# Patient Record
Sex: Male | Born: 1935 | Race: White | Hispanic: No | Marital: Married | Smoking: Former smoker
Health system: Southern US, Community
[De-identification: ages and names within clinical notes are randomized; demographics above are authoritative.]

## PROBLEM LIST (undated history)

## (undated) DIAGNOSIS — R0609 Other forms of dyspnea: Secondary | ICD-10-CM

## (undated) DIAGNOSIS — I2510 Atherosclerotic heart disease of native coronary artery without angina pectoris: Secondary | ICD-10-CM

## (undated) DIAGNOSIS — Z9861 Coronary angioplasty status: Secondary | ICD-10-CM

## (undated) DIAGNOSIS — K219 Gastro-esophageal reflux disease without esophagitis: Secondary | ICD-10-CM

## (undated) DIAGNOSIS — K5732 Diverticulitis of large intestine without perforation or abscess without bleeding: Secondary | ICD-10-CM

## (undated) DIAGNOSIS — D649 Anemia, unspecified: Secondary | ICD-10-CM

## (undated) DIAGNOSIS — M170 Bilateral primary osteoarthritis of knee: Secondary | ICD-10-CM

## (undated) DIAGNOSIS — Z9889 Other specified postprocedural states: Secondary | ICD-10-CM

## (undated) DIAGNOSIS — I2121 ST elevation (STEMI) myocardial infarction involving left circumflex coronary artery: Secondary | ICD-10-CM

## (undated) DIAGNOSIS — I5189 Other ill-defined heart diseases: Secondary | ICD-10-CM

## (undated) DIAGNOSIS — Z951 Presence of aortocoronary bypass graft: Secondary | ICD-10-CM

## (undated) DIAGNOSIS — I10 Essential (primary) hypertension: Secondary | ICD-10-CM

## (undated) DIAGNOSIS — G4730 Sleep apnea, unspecified: Secondary | ICD-10-CM

## (undated) DIAGNOSIS — R0600 Dyspnea, unspecified: Secondary | ICD-10-CM

## (undated) DIAGNOSIS — J45909 Unspecified asthma, uncomplicated: Secondary | ICD-10-CM

## (undated) DIAGNOSIS — G8929 Other chronic pain: Secondary | ICD-10-CM

## (undated) DIAGNOSIS — M25473 Effusion, unspecified ankle: Secondary | ICD-10-CM

## (undated) DIAGNOSIS — N529 Male erectile dysfunction, unspecified: Secondary | ICD-10-CM

## (undated) DIAGNOSIS — K649 Unspecified hemorrhoids: Secondary | ICD-10-CM

## (undated) DIAGNOSIS — J189 Pneumonia, unspecified organism: Secondary | ICD-10-CM

## (undated) DIAGNOSIS — I519 Heart disease, unspecified: Secondary | ICD-10-CM

## (undated) DIAGNOSIS — E785 Hyperlipidemia, unspecified: Secondary | ICD-10-CM

## (undated) DIAGNOSIS — Z8701 Personal history of pneumonia (recurrent): Secondary | ICD-10-CM

## (undated) DIAGNOSIS — M549 Dorsalgia, unspecified: Secondary | ICD-10-CM

## (undated) DIAGNOSIS — I509 Heart failure, unspecified: Secondary | ICD-10-CM

## (undated) DIAGNOSIS — K8020 Calculus of gallbladder without cholecystitis without obstruction: Secondary | ICD-10-CM

## (undated) DIAGNOSIS — I350 Nonrheumatic aortic (valve) stenosis: Secondary | ICD-10-CM

## (undated) DIAGNOSIS — Z8679 Personal history of other diseases of the circulatory system: Secondary | ICD-10-CM

## (undated) DIAGNOSIS — D126 Benign neoplasm of colon, unspecified: Secondary | ICD-10-CM

## (undated) DIAGNOSIS — K5790 Diverticulosis of intestine, part unspecified, without perforation or abscess without bleeding: Secondary | ICD-10-CM

## (undated) DIAGNOSIS — K449 Diaphragmatic hernia without obstruction or gangrene: Secondary | ICD-10-CM

## (undated) HISTORY — PX: OTHER SURGICAL HISTORY: SHX169

## (undated) HISTORY — DX: Dorsalgia, unspecified: M54.9

## (undated) HISTORY — DX: Coronary angioplasty status: Z98.61

## (undated) HISTORY — DX: Atherosclerotic heart disease of native coronary artery without angina pectoris: I25.10

## (undated) HISTORY — PX: CATARACT EXTRACTION: SUR2

## (undated) HISTORY — PX: CORONARY ANGIOPLASTY: SHX604

## (undated) HISTORY — PX: EYE SURGERY: SHX253

## (undated) HISTORY — DX: Personal history of other diseases of the circulatory system: Z86.79

## (undated) HISTORY — DX: Effusion, unspecified ankle: M25.473

## (undated) HISTORY — DX: ST elevation (STEMI) myocardial infarction involving left circumflex coronary artery: I21.21

## (undated) HISTORY — DX: Essential (primary) hypertension: I10

## (undated) HISTORY — DX: Anemia, unspecified: D64.9

## (undated) HISTORY — DX: Presence of aortocoronary bypass graft: Z95.1

## (undated) HISTORY — DX: Diverticulosis of intestine, part unspecified, without perforation or abscess without bleeding: K57.90

## (undated) HISTORY — DX: Other specified postprocedural states: Z98.890

## (undated) HISTORY — PX: TRANSTHORACIC ECHOCARDIOGRAM: SHX275

## (undated) HISTORY — DX: Personal history of pneumonia (recurrent): Z87.01

## (undated) HISTORY — DX: Unspecified hemorrhoids: K64.9

## (undated) HISTORY — PX: MINOR HEMORRHOIDECTOMY: SHX6238

## (undated) HISTORY — DX: Nonrheumatic aortic (valve) stenosis: I35.0

## (undated) HISTORY — PX: TOTAL KNEE ARTHROPLASTY: SHX125

## (undated) HISTORY — PX: BACK SURGERY: SHX140

## (undated) HISTORY — DX: Gastro-esophageal reflux disease without esophagitis: K21.9

## (undated) HISTORY — DX: Hyperlipidemia, unspecified: E78.5

## (undated) HISTORY — DX: Male erectile dysfunction, unspecified: N52.9

## (undated) HISTORY — PX: KNEE ARTHROSCOPY: SUR90

## (undated) HISTORY — DX: Benign neoplasm of colon, unspecified: D12.6

## (undated) HISTORY — DX: Dyspnea, unspecified: R06.00

## (undated) HISTORY — DX: Bilateral primary osteoarthritis of knee: M17.0

## (undated) HISTORY — DX: Other forms of dyspnea: R06.09

## (undated) HISTORY — DX: Heart failure, unspecified: I50.9

## (undated) HISTORY — DX: Calculus of gallbladder without cholecystitis without obstruction: K80.20

## (undated) HISTORY — DX: Other chronic pain: G89.29

## (undated) HISTORY — DX: Diaphragmatic hernia without obstruction or gangrene: K44.9

---
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
## 1983-10-05 DIAGNOSIS — I2510 Atherosclerotic heart disease of native coronary artery without angina pectoris: Secondary | ICD-10-CM

## 1983-10-05 DIAGNOSIS — I2121 ST elevation (STEMI) myocardial infarction involving left circumflex coronary artery: Secondary | ICD-10-CM

## 1983-10-05 HISTORY — DX: Atherosclerotic heart disease of native coronary artery without angina pectoris: I25.10

## 1983-10-05 HISTORY — DX: ST elevation (STEMI) myocardial infarction involving left circumflex coronary artery: I21.21

## 1994-06-28 DIAGNOSIS — I25119 Atherosclerotic heart disease of native coronary artery with unspecified angina pectoris: Secondary | ICD-10-CM | POA: Insufficient documentation

## 1994-10-04 DIAGNOSIS — K5732 Diverticulitis of large intestine without perforation or abscess without bleeding: Secondary | ICD-10-CM

## 1994-10-04 HISTORY — PX: COLONOSCOPY: SHX174

## 1994-10-04 HISTORY — DX: Diverticulitis of large intestine without perforation or abscess without bleeding: K57.32

## 1995-10-05 DIAGNOSIS — Z951 Presence of aortocoronary bypass graft: Secondary | ICD-10-CM

## 1995-10-05 HISTORY — DX: Presence of aortocoronary bypass graft: Z95.1

## 1995-10-05 HISTORY — PX: CORONARY ARTERY BYPASS GRAFT: SHX141

## 2000-01-27 ENCOUNTER — Encounter: Admission: RE | Admit: 2000-01-27 | Discharge: 2000-04-26 | Payer: Self-pay | Admitting: Family Medicine

## 2002-09-04 ENCOUNTER — Encounter: Admission: RE | Admit: 2002-09-04 | Discharge: 2002-09-04 | Payer: Self-pay | Admitting: Cardiology

## 2002-09-04 ENCOUNTER — Encounter: Payer: Self-pay | Admitting: Cardiology

## 2002-10-04 DIAGNOSIS — I2510 Atherosclerotic heart disease of native coronary artery without angina pectoris: Secondary | ICD-10-CM

## 2002-10-04 HISTORY — DX: Atherosclerotic heart disease of native coronary artery without angina pectoris: I25.10

## 2003-06-05 HISTORY — PX: PERCUTANEOUS CORONARY STENT INTERVENTION (PCI-S): SHX6016

## 2003-06-05 HISTORY — PX: CARDIAC CATHETERIZATION: SHX172

## 2003-06-05 HISTORY — PX: LEFT HEART CATH AND CORS/GRAFTS ANGIOGRAPHY: CATH118250

## 2003-06-13 ENCOUNTER — Inpatient Hospital Stay (HOSPITAL_COMMUNITY): Admission: AD | Admit: 2003-06-13 | Discharge: 2003-06-18 | Payer: Self-pay | Admitting: Emergency Medicine

## 2003-06-13 ENCOUNTER — Encounter: Payer: Self-pay | Admitting: Cardiology

## 2003-06-15 ENCOUNTER — Encounter: Payer: Self-pay | Admitting: Cardiovascular Disease

## 2004-12-14 ENCOUNTER — Inpatient Hospital Stay (HOSPITAL_COMMUNITY): Admission: RE | Admit: 2004-12-14 | Discharge: 2004-12-17 | Payer: Self-pay | Admitting: Orthopedic Surgery

## 2005-08-04 ENCOUNTER — Ambulatory Visit (HOSPITAL_COMMUNITY): Admission: RE | Admit: 2005-08-04 | Discharge: 2005-08-04 | Payer: Self-pay | Admitting: Orthopedic Surgery

## 2005-08-04 ENCOUNTER — Ambulatory Visit (HOSPITAL_BASED_OUTPATIENT_CLINIC_OR_DEPARTMENT_OTHER): Admission: RE | Admit: 2005-08-04 | Discharge: 2005-08-04 | Payer: Self-pay | Admitting: Orthopedic Surgery

## 2005-12-02 HISTORY — PX: PERCUTANEOUS CORONARY STENT INTERVENTION (PCI-S): SHX6016

## 2006-06-02 ENCOUNTER — Inpatient Hospital Stay (HOSPITAL_COMMUNITY): Admission: EM | Admit: 2006-06-02 | Discharge: 2006-06-03 | Payer: Self-pay | Admitting: Emergency Medicine

## 2006-09-22 ENCOUNTER — Ambulatory Visit: Payer: Self-pay | Admitting: Family Medicine

## 2006-12-16 ENCOUNTER — Inpatient Hospital Stay (HOSPITAL_COMMUNITY): Admission: EM | Admit: 2006-12-16 | Discharge: 2006-12-17 | Payer: Self-pay | Admitting: Emergency Medicine

## 2007-01-19 ENCOUNTER — Ambulatory Visit: Payer: Self-pay | Admitting: Family Medicine

## 2007-11-13 ENCOUNTER — Ambulatory Visit: Payer: Self-pay | Admitting: Family Medicine

## 2008-03-12 ENCOUNTER — Ambulatory Visit: Payer: Self-pay | Admitting: Family Medicine

## 2008-07-09 ENCOUNTER — Ambulatory Visit: Payer: Self-pay | Admitting: Family Medicine

## 2008-12-02 HISTORY — PX: CARDIAC CATHETERIZATION: SHX172

## 2008-12-02 HISTORY — PX: LEFT HEART CATH AND CORS/GRAFTS ANGIOGRAPHY: CATH118250

## 2008-12-15 ENCOUNTER — Inpatient Hospital Stay (HOSPITAL_COMMUNITY): Admission: EM | Admit: 2008-12-15 | Discharge: 2008-12-16 | Payer: Self-pay | Admitting: Emergency Medicine

## 2009-01-01 ENCOUNTER — Ambulatory Visit (HOSPITAL_COMMUNITY): Admission: RE | Admit: 2009-01-01 | Discharge: 2009-01-01 | Payer: Self-pay | Admitting: Cardiovascular Disease

## 2009-04-01 ENCOUNTER — Ambulatory Visit: Payer: Self-pay | Admitting: Family Medicine

## 2009-07-02 ENCOUNTER — Ambulatory Visit: Payer: Self-pay | Admitting: Family Medicine

## 2009-08-15 ENCOUNTER — Ambulatory Visit (HOSPITAL_COMMUNITY): Admission: RE | Admit: 2009-08-15 | Discharge: 2009-08-17 | Payer: Self-pay | Admitting: Orthopedic Surgery

## 2009-08-21 ENCOUNTER — Ambulatory Visit: Payer: Self-pay | Admitting: Family Medicine

## 2010-03-31 ENCOUNTER — Ambulatory Visit: Payer: Self-pay | Admitting: Family Medicine

## 2010-04-01 ENCOUNTER — Encounter: Admission: RE | Admit: 2010-04-01 | Discharge: 2010-04-01 | Payer: Self-pay | Admitting: Family Medicine

## 2010-04-23 ENCOUNTER — Ambulatory Visit (HOSPITAL_COMMUNITY): Admission: RE | Admit: 2010-04-23 | Discharge: 2010-04-24 | Payer: Self-pay | Admitting: Neurological Surgery

## 2010-04-23 HISTORY — PX: OTHER SURGICAL HISTORY: SHX169

## 2010-04-23 HISTORY — PX: CERVICAL DISCECTOMY: SHX98

## 2010-05-25 ENCOUNTER — Encounter: Admission: RE | Admit: 2010-05-25 | Discharge: 2010-05-25 | Payer: Self-pay | Admitting: Anesthesiology

## 2010-08-11 ENCOUNTER — Encounter: Admission: RE | Admit: 2010-08-11 | Discharge: 2010-08-11 | Payer: Self-pay | Admitting: Neurological Surgery

## 2010-10-09 ENCOUNTER — Ambulatory Visit
Admission: RE | Admit: 2010-10-09 | Discharge: 2010-10-09 | Payer: Self-pay | Source: Home / Self Care | Attending: Family Medicine | Admitting: Family Medicine

## 2010-11-16 ENCOUNTER — Ambulatory Visit
Admission: RE | Admit: 2010-11-16 | Discharge: 2010-11-16 | Disposition: A | Discharge status: Home / Self Care | Payer: Medicare Other | Source: Ambulatory Visit | Attending: Neurological Surgery | Admitting: Neurological Surgery

## 2010-11-16 ENCOUNTER — Other Ambulatory Visit: Payer: Self-pay | Admitting: Neurological Surgery

## 2010-11-16 DIAGNOSIS — M67919 Unspecified disorder of synovium and tendon, unspecified shoulder: Secondary | ICD-10-CM

## 2010-11-16 DIAGNOSIS — M7500 Adhesive capsulitis of unspecified shoulder: Secondary | ICD-10-CM

## 2010-12-19 LAB — DIFFERENTIAL
Basophils Relative: 0 % (ref 0–1)
Eosinophils Absolute: 0 10*3/uL (ref 0.0–0.7)
Eosinophils Relative: 0 % (ref 0–5)
Lymphocytes Relative: 24 % (ref 12–46)
Lymphs Abs: 1.3 10*3/uL (ref 0.7–4.0)
Monocytes Absolute: 0.6 10*3/uL (ref 0.1–1.0)
Monocytes Relative: 10 % (ref 3–12)
Neutro Abs: 3.6 10*3/uL (ref 1.7–7.7)

## 2010-12-19 LAB — COMPREHENSIVE METABOLIC PANEL
AST: 29 U/L (ref 0–37)
Albumin: 3.8 g/dL (ref 3.5–5.2)
Chloride: 104 mEq/L (ref 96–112)
Creatinine, Ser: 1.56 mg/dL — ABNORMAL HIGH (ref 0.4–1.5)
GFR calc Af Amer: 53 mL/min — ABNORMAL LOW (ref >60)
GFR calc non Af Amer: 44 mL/min — ABNORMAL LOW (ref >60)
Potassium: 4.6 mEq/L (ref 3.5–5.1)
Sodium: 138 mEq/L (ref 135–145)
Total Protein: 6.5 g/dL (ref 6.0–8.3)

## 2010-12-19 LAB — GLUCOSE, CAPILLARY
Glucose-Capillary: 170 mg/dL — ABNORMAL HIGH (ref 70–99)
Glucose-Capillary: 170 mg/dL — ABNORMAL HIGH (ref 70–99)
Glucose-Capillary: 223 mg/dL — ABNORMAL HIGH (ref 70–99)

## 2010-12-19 LAB — PROTIME-INR: Prothrombin Time: 12.4 seconds (ref 11.6–15.2)

## 2010-12-19 LAB — CBC
Hemoglobin: 12.8 g/dL — ABNORMAL LOW (ref 13.0–17.0)
MCV: 96.4 fL (ref 78.0–100.0)
WBC: 5.5 10*3/uL (ref 4.0–10.5)

## 2011-01-06 LAB — GLUCOSE, CAPILLARY
Glucose-Capillary: 107 mg/dL — ABNORMAL HIGH (ref 70–99)
Glucose-Capillary: 109 mg/dL — ABNORMAL HIGH (ref 70–99)
Glucose-Capillary: 110 mg/dL — ABNORMAL HIGH (ref 70–99)
Glucose-Capillary: 113 mg/dL — ABNORMAL HIGH (ref 70–99)
Glucose-Capillary: 166 mg/dL — ABNORMAL HIGH (ref 70–99)

## 2011-01-06 LAB — CBC
HCT: 33.8 % — ABNORMAL LOW (ref 39.0–52.0)
HCT: 34.8 % — ABNORMAL LOW (ref 39.0–52.0)
MCHC: 33.7 g/dL (ref 30.0–36.0)
MCHC: 34 g/dL (ref 30.0–36.0)
MCV: 94.5 fL (ref 78.0–100.0)
MCV: 97.4 fL (ref 78.0–100.0)
Platelets: 133 10*3/uL — ABNORMAL LOW (ref 150–400)
Platelets: 199 10*3/uL (ref 150–400)
RBC: 3.19 MIL/uL — ABNORMAL LOW (ref 4.22–5.81)
RBC: 3.58 MIL/uL — ABNORMAL LOW (ref 4.22–5.81)
WBC: 7.8 10*3/uL (ref 4.0–10.5)

## 2011-01-06 LAB — COMPREHENSIVE METABOLIC PANEL
Albumin: 3.6 g/dL (ref 3.5–5.2)
CO2: 25 mEq/L (ref 19–32)
GFR calc Af Amer: 41 mL/min — ABNORMAL LOW (ref >60)
GFR calc non Af Amer: 34 mL/min — ABNORMAL LOW (ref >60)
Glucose, Bld: 184 mg/dL — ABNORMAL HIGH (ref 70–99)
Sodium: 139 mEq/L (ref 135–145)
Total Bilirubin: 0.4 mg/dL (ref 0.3–1.2)
Total Protein: 6.3 g/dL (ref 6.0–8.3)

## 2011-01-06 LAB — TYPE AND SCREEN: ABO/RH(D): A POS

## 2011-01-06 LAB — POCT I-STAT 4, (NA,K, GLUC, HGB,HCT): Hemoglobin: 10.2 g/dL — ABNORMAL LOW (ref 13.0–17.0)

## 2011-01-06 LAB — HEMOGLOBIN AND HEMATOCRIT, BLOOD: Hemoglobin: 9.4 g/dL — ABNORMAL LOW (ref 13.0–17.0)

## 2011-01-14 LAB — GLUCOSE, CAPILLARY
Glucose-Capillary: 100 mg/dL — ABNORMAL HIGH (ref 70–99)
Glucose-Capillary: 153 mg/dL — ABNORMAL HIGH (ref 70–99)

## 2011-01-14 LAB — CBC
HCT: 39.7 % (ref 39.0–52.0)
Hemoglobin: 13.5 g/dL (ref 13.0–17.0)
Hemoglobin: 13.9 g/dL (ref 13.0–17.0)
MCHC: 34.3 g/dL (ref 30.0–36.0)
RBC: 4.07 MIL/uL — ABNORMAL LOW (ref 4.22–5.81)
RBC: 4.18 MIL/uL — ABNORMAL LOW (ref 4.22–5.81)
RDW: 13.6 % (ref 11.5–15.5)
WBC: 5.2 10*3/uL (ref 4.0–10.5)
WBC: 6.1 10*3/uL (ref 4.0–10.5)

## 2011-01-14 LAB — DIFFERENTIAL
Lymphs Abs: 1.3 10*3/uL (ref 0.7–4.0)
Monocytes Absolute: 0.4 10*3/uL (ref 0.1–1.0)
Monocytes Relative: 7 % (ref 3–12)
Neutro Abs: 4.3 10*3/uL (ref 1.7–7.7)
Neutrophils Relative %: 72 % (ref 43–77)

## 2011-01-14 LAB — MAGNESIUM: Magnesium: 2.2 mg/dL (ref 1.5–2.5)

## 2011-01-14 LAB — POCT I-STAT, CHEM 8
HCT: 45 % (ref 39.0–52.0)
Hemoglobin: 15.3 g/dL (ref 13.0–17.0)
Sodium: 136 mEq/L (ref 135–145)
TCO2: 23 mmol/L (ref 0–100)

## 2011-01-14 LAB — APTT: aPTT: 27 seconds (ref 24–37)

## 2011-01-14 LAB — LIPID PANEL
Total CHOL/HDL Ratio: 6.1 RATIO
VLDL: 30 mg/dL (ref 0–40)

## 2011-01-14 LAB — COMPREHENSIVE METABOLIC PANEL
Albumin: 3.7 g/dL (ref 3.5–5.2)
BUN: 23 mg/dL (ref 6–23)
Creatinine, Ser: 1.34 mg/dL (ref 0.4–1.5)
Glucose, Bld: 114 mg/dL — ABNORMAL HIGH (ref 70–99)
Total Bilirubin: 0.5 mg/dL (ref 0.3–1.2)
Total Protein: 6.5 g/dL (ref 6.0–8.3)

## 2011-01-14 LAB — CARDIAC PANEL(CRET KIN+CKTOT+MB+TROPI)
CK, MB: 6.4 ng/mL — ABNORMAL HIGH (ref 0.3–4.0)
CK, MB: 7.1 ng/mL — ABNORMAL HIGH (ref 0.3–4.0)
CK, MB: 7.4 ng/mL — ABNORMAL HIGH (ref 0.3–4.0)
Relative Index: 2.6 — ABNORMAL HIGH (ref 0.0–2.5)
Relative Index: 2.8 — ABNORMAL HIGH (ref 0.0–2.5)
Total CK: 244 U/L — ABNORMAL HIGH (ref 7–232)
Total CK: 247 U/L — ABNORMAL HIGH (ref 7–232)
Troponin I: <0.01 ng/mL (ref 0.00–0.06)
Troponin I: <0.01 ng/mL (ref 0.00–0.06)
Troponin I: <0.01 ng/mL (ref 0.00–0.06)

## 2011-01-14 LAB — POCT CARDIAC MARKERS: Myoglobin, poc: 152 ng/mL (ref 12–200)

## 2011-01-14 LAB — HEMOGLOBIN A1C
Hgb A1c MFr Bld: 6.2 % — ABNORMAL HIGH (ref 4.6–6.1)
Mean Plasma Glucose: 131 mg/dL

## 2011-01-14 LAB — PROTIME-INR: INR: 1 (ref 0.00–1.49)

## 2011-01-14 LAB — BRAIN NATRIURETIC PEPTIDE: Pro B Natriuretic peptide (BNP): <30 pg/mL (ref 0.0–100.0)

## 2011-01-26 ENCOUNTER — Other Ambulatory Visit: Payer: Self-pay | Admitting: Neurological Surgery

## 2011-01-26 DIAGNOSIS — M542 Cervicalgia: Secondary | ICD-10-CM

## 2011-02-08 ENCOUNTER — Ambulatory Visit
Admission: RE | Admit: 2011-02-08 | Discharge: 2011-02-08 | Disposition: A | Discharge status: Home / Self Care | Payer: Medicare Other | Source: Ambulatory Visit | Attending: Neurological Surgery | Admitting: Neurological Surgery

## 2011-02-08 DIAGNOSIS — M542 Cervicalgia: Secondary | ICD-10-CM

## 2011-02-16 NOTE — Cardiovascular Report (Signed)
NAMEJOSEDANIEL, [name] NO.:  [id number]   MEDICAL RECORD NO.:  [id number]          PATIENT TYPE:  OIB   LOCATION:  2899                         FACILITY:  [facility]   PHYSICIAN:  Thereasa Solo. Little, M.D. DATE OF BIRTH:  Aug 16, 1936   DATE OF PROCEDURE:  01/01/2009  DATE OF DISCHARGE:  01/01/2009                            CARDIAC CATHETERIZATION   INDICATIONS FOR TEST:  This 75 year old male had an inferior infarct in  1985, bypass surgery in 1997, had a Cypher stent to his RCA in 2007, and  prior to that had a stent placed in circumflex system.   He was hospitalized overnight on December 15, 2008, with chest pain, ruled  out for an MI, had a nuclear study that was negative for ischemia and a  51% ejection fraction but despite this continues to have exertional  chest pain that is nitroglycerin responsive.  Because of this, the plans  were made to bring him for an outpatient cardiac catheterization.   After obtained informed consent, the patient was prepped and draped in  the usual sterile fashion exposing the right groin.  Following local  anesthetic with 1% Xylocaine, the Seldinger technique was employed and a  5-French introducer sheath was placed in the right femoral artery.  Left  and right coronary arteriography and ventriculography and graft  visualization was performed.   COMPLICATIONS:  None.   EQUIPMENT:  A 5-French Judkins configuration catheters.  The grafts were  visualized with a right coronary catheter.   Total contrast 70 mL.   RESULTS:  1. Hemodynamic monitoring:  His central aortic pressure was 149/69.      His left ventricular pressure was 155/5.  At the time of pullback      across the aortic valve, there was no significant gradient.  2. 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  The remainder of the segments were normal.  His   ejection fraction was approximately 50%.  There was no mitral      regurgitation.  The left ventricular end-diastolic pressure was 22.  3. Coronary arteriography:  On fluoroscopy, stents were noted in the      distribution of the right coronary artery and circumflex with mild      calcification in the left main and LAD.  4. Left main normal and bifurcated.  5. LAD.  The LAD was 100% occluded after the first septal perforator,      but the distal LAD was grafted.  6. Circumflex.  The circumflex gave rise to 1 large OM vessel.  There      was a stent that extended from the ongoing circumflex and well into      the large OM vessel that bifurcated.  The stent was widely patent      as was the proximal circumflex and the ongoing OM.  The ongoing      circumflex, however, came off within the stent self.  It was a  small vessel.  It had ostial and proximal 70-80% narrowing with an      area of saccular dilatation just adjacent to the area of narrowing.      The ongoing vessel was relatively small with mild-to-moderate      irregularities.  The vessel was approximately 1.5-1.75 mm in      diameter, was relatively short.  I compared this to March 2008 cath      and there has been no significant change in the appearance of this      vessel.   1. Right coronary artery.  Right coronary artery was a dominant      vessel.  There was a long stent in the midportion of the right      coronary artery that was widely patent.  There was minimal less      than 40% distal irregularities.  The PDA and posterolateral vessels      were free of disease.  2. Saphenous vein graft to the OM, 100% occluded in its ostium.  3. Internal mammary artery to the LAD.  The internal mammary artery to      the LAD was widely patent and filled the diagonal retrograde.   CONCLUSIONS:  1. Loss of the saphenous vein graft to the OM system, which is an old      finding.  2. Widely patent stents in the native OM and in the  right coronary      artery.  3. No high-grade stenosis in any major vessels, but the ongoing      circumflex has ostial 70-80% narrowing.  This comes off within the      stent, is followed by saccular aneurysmal segment.  Any type of      intervention on this relatively small and somewhat insignificant      vessel would clearly be high risk.  In view of his nuclear study      being negative for ischemia, I have opted to treat him medically.      He will be discharged to home today.  I will see him in the office      in about a week and I have increased metoprolol from 50 to 75 mg.           ______________________________  Thereasa Solo. Little, M.D.     ABL/MEDQ  D:  01/01/2009  T:  01/02/2009  Job:  [id number]   cc:   Sharlot Gowda, M.D.  Cardiac Cath Lab

## 2011-02-16 NOTE — Discharge Summary (Signed)
NAMEDARRIK, RICHMAN NO.:  [id number]   MEDICAL RECORD NO.:  [id number]          PATIENT TYPE:  INP   LOCATION:  4729                         FACILITY:  [facility]   PHYSICIAN:  Thereasa Solo. Little, M.D. DATE OF BIRTH:  1936-08-04   DATE OF ADMISSION:  12/15/2008  DATE OF DISCHARGE:  12/16/2008                               DISCHARGE SUMMARY   DISCHARGE DIAGNOSES:  1. Unstable angina, myocardial infarction ruled out.  2. Known coronary artery disease with history of coronary artery      bypass grafting in 1997 multiple percutaneous coronary intervention      stents, last intervention was right coronary artery Cypher stent in      August 2007.  3. Treated dyslipidemia.  4. Type 2 diabetes, non-insulin dependent.  5. Ischemic cardiomyopathy with an ejection fraction of 30-35%.  6. Degenerative joint disease with previous history of C-spine fusion.   HOSPITAL COURSE:  The patient is a 75 year old male with coronary artery  disease as described above who presented with chest pain worrisome for  unstable angina on December 15, 2008.  His troponins are negative.  The  patient was seen by Dr. Garen Lah on December 16, 2008.  He has had no  recurrent pain.  Dr. Garen Lah feels he can be discharged and have a  outpatient stress test, this will be set up for tomorrow.   LABORATORY DATA AT DISCHARGE:  White count 5.2, hemoglobin 13.5,  hematocrit 39.7, platelets 151.  Sodium 134, potassium 4.1, BUN 23,  creatinine 1.3, hemoglobin A1c is 6.2.  CK MBs were slightly elevated at  266 and 244, MBs were 7, troponins were negative.  Lipids show a  cholesterol 189, HDL 31, LDL 128.  TSH is 4.03, digoxin level 0.6.  A  chest x-ray is no acute process.  EKG shows sinus rhythm, sinus  bradycardia, and inferior Qs.   DISCHARGE MEDICATIONS:  1. Plavix 75 mg a day.  2. Lanoxin 0.125 mg a day.  3. Metoprolol XL 50 mg daily.  4. Hyzaar 100/12.5 daily.  5. Actoplus  15/850 daily.  6. Iron once a  day.  7. Aspirin 325 mg a day.  8. Prilosec 20 mg a day.  9. Trilipix.  10.He also will be taking nitroglycerin sublingual p.r.n.   DISPOSITION:  The patient discharged in stable condition and will be set  up for a Lexiscan Myoview in the office and then a close followup with  Dr. Clarene Duke.      Abelino Derrick, P.A.    ______________________________  Thereasa Solo. Little, M.D.    Lenard Lance  D:  12/16/2008  T:  12/17/2008  Job:  [id number]   cc:   Sharlot Gowda, M.D.

## 2011-02-19 NOTE — Op Note (Signed)
[name], [name] NO.:  [id number]   MEDICAL RECORD NO.:  [id number]          PATIENT TYPE:  INP   LOCATION:  5030                         FACILITY:  [facility]   PHYSICIAN:  Mila Homer. Sherlean Foot, M.D. DATE OF BIRTH:  [birth year]   DATE OF PROCEDURE:  12/14/2004  DATE OF DISCHARGE:                                 OPERATIVE REPORT   SURGEON:  Georgena Spurling, M.D.   ASSISTANTChestine Spore, P.A.   ANESTHESIA:  General.   PREOPERATIVE DIAGNOSIS:  Right knee arthritis.   POSTOPERATIVE DIAGNOSIS:  Right knee arthritis.   PROCEDURE:  Right total knee arthroplasty.   INDICATIONS FOR PROCEDURE:  The patient is a 75 year old white male with  failure of conservative measures for osteoarthritis of the knee.  Informed  consent was obtained.   DESCRIPTION OF PROCEDURE:  Patient was laid supine and administered general  anesthesia.  The right leg was prepped and draped in the usual sterile  fashion.  A #10 blade was used to make a midline incision.  A fresh blade  was used to make a median parapatellar arthrotomy and to elevate the deep  MCL off the medial crust of the tibia.  A synovectomy was also performed  with a #10 blade.  I then inverted the patella, measured at 24 mm thick.  I  reamed it down to a thickness of 15 and then drilled 3 lug holes and with  the prosthetic trial in place recreated a 24 mm thick patella.  I removed  the prosthetic trial and went into flexion with the Z-retractor behind the  MCL.  I then set our extramedullary tibial alignment guide up and made a  cut, removing 2 mm of bone off the medial tibial plateau, a perpendicular  cut to the anatomic access of the tibia.  I then removed the external jig  and the cut surface of the tibia.  I then made an intramedullary drill hole  in the femur.  I set the intramedullary guide on the 60 degree valgus cut,  tented down to the distal femur and pinned the distal femoral cutting block  into place.  I removed the  intramedullary guide and made the distal femoral  cut with a sagittal saw.  I then removed the cutting block.  I then marked  out __________ along the epicondylar axis and used a multi-Robinson  technique to orient the femoral component.  I sized to a size E, it was  between an E and an F and I decided to go down a size.  I pinned the 4-in-1  cutting block the 3 degree external rotation holes, matching our posterior  condylar angle.  I then made anterior, posterior and Chamfer cuts with a  sagittal saw.  I removed the cutting block and the cut surfaces of the bone  and placed a laminar spreader in the knee.  I removed the medial and lateral  menisci, ACL, PCL and posterior condylar osteophytes.  I then placed a 10 mm  spacer block and the knee had excellent flexion, extension gap balance.  At  this point,  I placed a scoop retractor in the knee and finished the femur  with a size 8 tibial cutting block drill and cutting the notch and drilling  the lug holes.  I removed the finishing block from the femur and placed a  size 5 tibial tray, finishing tray, and used the drill on the keel.  I then  trialed with a size 5 tibia, size E femur, size 10 insert and size 35  patella.  Had excellent flexion, extension gap balance, range of motion 0 to  drop and dangle of 130 degrees, patella tracked perfectly.  I then removed  all of the components and copiously irrigated.  I then cemented in the tibia  first, femur second, removed excess cement, snapped in __________  polyethylene, located the knee.  I then cemented in the patella.  Once the  cement was hardened, I let the tourniquet down, cauterized the bleeding  vessels and then closed with interrupted figure-of-eight #1 Vicryl sutures  in the arthrotomy, interrupted 0 buried Vicryl sutures in the deep soft  tissues and a running subcuticular Vicryl stitch and skin staples.  A  dressing with Adaptic, 4 x 4, sterile Webril and a TED stocking.    COMPLICATIONS:  None.   DRAINS:  One Hemovac.   EBL:  300 mL.   TOURNIQUET TIME:  1 hour.      SDL/MEDQ  D:  12/14/2004  T:  12/14/2004  Job:  [id number]

## 2011-02-19 NOTE — H&P (Signed)
[name], [name] NO.:  [id number]   MEDICAL RECORD NO.:  [id number]          PATIENT TYPE:  INP   LOCATION:  NA                           FACILITY:  [facility]   PHYSICIAN:  Mila Homer. Sherlean Foot, M.D. DATE OF BIRTH:  [birth year]   DATE OF ADMISSION:  12/14/2004  DATE OF DISCHARGE:                                HISTORY & PHYSICAL   CHIEF COMPLAINT:  Right knee pain significantly worse over the last two or  three months.   HISTORY OF PRESENT ILLNESS:  Patient is a 75 year old white male with  intermittent 20-30 year history of progressively worsening right knee pain  which has significant worsened since the first of this year.  He describes  the pain as a constant ache pain in the medial aspect of his knee with a  sharp shooting pain with awkward movements.  It does radiate up into the leg  into the hip.  He denies any mechanical symptoms but he does have occasional  locking and giving out of the knee.  He does have swelling.  He does have  significant night pain.  He denies any previous injury.  X-rays reveal bone  on bone medial joint.   ALLERGIES:  No known drug allergies.   CURRENT MEDICATIONS:  1.  Lanoxin 0.25 mg p.o. daily.  2.  Ecotrin 325 mg p.o. daily.  3.  Toprol XL 50 mg p.o. daily.  4.  Cozaar 100 mg p.o. daily.  5.  Plavix 75 mg p.o. daily.  6.  Prevacid 30 mg p.o. daily.  7.  Metformin HCL 500 mg p.o. b.i.d.  8.  Viagra 50 mg p.r.n.   PAST MEDICAL HISTORY:  1.  Diabetes mellitus.  2.  Coronary artery disease with five MIs, CABG, and stent with recent      stress test evaluation.   PAST SURGICAL HISTORY:  1.  Back surgery in 1978, 1982, 1986, 1989, and 1990 by Dr. Simonne Come.  2.  Left knee arthroscopy by Dr. Simonne Come in 1984.  3.  Neck surgery by Dr. Otelia Sergeant in 1991.  4.  Coronary angioplasty by Dr. Clarene Duke in 2000.  5.  Hemorrhoidectomy by Dr. __________ in 1982.  6.  Heart surgery with Dr. Clarene Duke in 1985, 1986, and 2004.  7.  Bypass surgery  in 1990 by Dr. Edwyna Shell.   Patient states he has had a Staph infection with a previous heart surgery  which required a one week hospitalization, but no long-term IV antibiotics.  Patient states he has no previous complications with anesthesia except for  postoperative nausea.   SOCIAL HISTORY:  Patient is a 75 year old healthy-appearing, tall white  male.  Denies any history of smoking.  He does have the occasional alcoholic  beverage.  He is married.  He has two grown children.  He lives in a one-  story house.  He is a retired Curator.   Family physician is Dr. Sharlot Gowda at [phone].   Cardiology is Dr. Julieanne Manson.   FAMILY HISTORY:  Mother is deceased from unknown causes.  Father is deceased  from complications of his  heart and diabetes.  Two brothers are deceased  from MIs.  One brother alive, unknown medical issues.  Two sisters alive,  one with diabetes.   REVIEW OF SYSTEMS:  Recent cold three to four weeks previous.  Was treated  by Dr. Susann Givens.  No resulting issues.  He does wear upper dentures.  He does  wear glasses at all times.  He does have some shortness of breath with  exertion related to his cardiac disease and deconditioning.  He does have  issues with reflux which are fairly well controlled with his Prevacid and  occasional urinary frequency.  He otherwise denies any other complaints and  issues in any other category with review of systems if not mentioned above.   PHYSICAL EXAMINATION:  VITAL SIGNS:  Height 5 feet 11 inches, weight 208  pounds, blood pressure 138/92, pulse regular at 80 beats per minute,  respirations 12.  Patient was afebrile.  GENERAL:  This is a fairly healthy-appearing, well-developed white male.  Ambulates fairly briskly with an even gait.  Easily gets on and off the  examination table.  HEENT:  Head was normocephalic.  Pupils are equal, round, and reactive.  Extraocular movements are intact.  Sclerae was not icteric.  External ears   without deformities.  Gross hearing is intact.  Oral buccal mucosa was pink.  He had upper dentures in place.  NECK:  Supple.  No palpable lymphadenopathy.  Thyroid region was nontender.  He had good range of motion of his cervical spine.  CHEST:  Lung sounds were clear and equal bilaterally.  No wheezes, rales,  rhonchi.  HEART:  Regular rate and rhythm.  No murmurs, rubs, or gallops.  He had a  well healed midline surgical incision across his chest.  ABDOMEN:  Soft, nontender.  Bowel sounds normoactive throughout.  CVA region  was nontender.  EXTREMITIES:  Upper extremities were symmetrically sized and shaped.  He had  good range of motion with shoulders, elbows, and wrists.  Motor strength was  5/5.  Lower extremities:  Right and left hip had full extension, flexion up  to 130 degrees with 20-30 degrees internal/external rotation without any  difficulty or tenderness.  Right knee had no appreciable swelling at this  time, no effusion.  He was significantly tender along the medial joint.  He  had full extension/flexion to 120 degrees.  He had about 5 degree valgus to  varus laxity, moderate crepitus on the patella.  Calf was nontender.  Left  knee had full extension, flexion back to 120 degrees.  No instability.  No  joint line tenderness.  No effusion.  The calf was soft and nontender.  The  ankles were symmetrical with good dorsi/plantar flexion.  PERIPHERAL VASCULAR:  Carotid pulses were 2+.  No bruits.  Radial pulses 2+.  He had 1+ posterior tibial pulses.  He had no lower extremity edema noted.  NEUROLOGIC:  Patient was conscious, alert, appropriate.  Easy conversation  with examiner.  Cranial nerves II-XII were grossly intact.  He had no gross  neurologic defects noted.  BREASTS:  Deferred at this time.  RECTAL:  Deferred at this time.  GENITOURINARY:  Deferred at this time.   IMPRESSION:  1.  End-stage osteoarthritis right knee.  2.  Diabetes. 3.  Coronary artery disease  with history of five myocardial infarctions,      coronary artery bypass graft, and stenting with recent stress test.   PLAN:  Patient indicates he has had a  recent stress test and has been  cleared by Dr. Clarene Duke from the cardiac standpoint.  We have not received any  of this information at this time.  We have contacted Dr. Fredirick Maudlin office for  this information to get clearance for surgery.  Patient indicates he has  also been to his primary care physician, Dr. Susann Givens, and we have not  received any information and this has also been requested.  Patient will  undergo all of the routine laboratories and tests prior to having this right  total knee arthroplasty on March 13 by Dr. Sherlean Foot.      RWK/MEDQ  D:  12/09/2004  T:  12/09/2004  Job:  [id number]

## 2011-02-19 NOTE — Op Note (Signed)
NAMEJAYLEND, [name] NO.:  [id number]   MEDICAL RECORD NO.:  [id number]          PATIENT TYPE:  AMB   LOCATION:  DSC                          FACILITY:  [facility]   PHYSICIAN:  Mila Homer. Sherlean Foot, M.D. DATE OF BIRTH:  Feb 17, 1936   DATE OF PROCEDURE:  08/04/2005  DATE OF DISCHARGE:                                 OPERATIVE REPORT   PREOPERATIVE DIAGNOSIS:  Left knee osteoarthritis with medial and lateral  meniscus tears.   POSTOPERATIVE DIAGNOSIS:  Left knee osteoarthritis with medial and lateral  meniscus tears.   OPERATION PERFORMED:  Left knee arthroscopy with partial medial  meniscectomy, partial lateral meniscectomy, chondroplasty of all three  compartments.   SURGEON:  Mila Homer. Sherlean Foot, M.D.   ASSISTANT:  None.   ANESTHESIA:  MAC.   INDICATIONS FOR PROCEDURE:  The patient is a 75 year old with a combination  of osteoarthritic symptoms and meniscal tear symptoms.  Informed consent was  obtained.   DESCRIPTION OF PROCEDURE:  The patient was taken to the operating room,  administered knee block and MAC anesthesia. The left lower extremity was  prepped and draped in the usual sterile fashion.  No tourniquet was used.  Inferolateral and inferomedial portals were created with a #11 blade, blunt  trocar and cannula.  Diagnostic arthroscopy revealed grade 3 chondromalacia  in the patellofemoral joint, grade 4 in the medial compartment and grade 1  and 2 in the lateral compartment. There was medial and lateral meniscus  tearing.  Partial medial meniscectomy and partial lateral meniscectomies  were carried out with a straight basket forceps and a 3.2 great white  shaver.  Once the meniscal cartilages were debrided back to a stable rim of  tissue, I then performed a chondroplasty on the medial femoral condyle,  medial tibial plateau, lateral tibial plateau and the trochlea and patella.  I then lavaged and closed with a 4-0 nylon suture, dressed with Xeroform  dressings, sponges, sterile Webril and Ace wrap.   COMPLICATIONS:  None.   DRAINS:  None.           ______________________________  Mila Homer. Sherlean Foot, M.D.     SDL/MEDQ  D:  08/04/2005  T:  08/04/2005  Job:  [id number]

## 2011-02-19 NOTE — Discharge Summary (Signed)
NAMEJANELLE, [name] NO.:  [id number]   MEDICAL RECORD NO.:  [id number]          PATIENT TYPE:  OBV   LOCATION:  6527                         FACILITY:  [facility]   PHYSICIAN:  Thereasa Solo. Little, M.D. DATE OF BIRTH:  [birth year]   DATE OF ADMISSION:  06/01/2006  DATE OF DISCHARGE:  06/03/2006                                 DISCHARGE SUMMARY   DISCHARGE DIAGNOSES:  1. Unstable angina, elective native right coronary artery Cypher stenting      this admission.  2. History of coronary disease, coronary artery bypass grafting in 1990.  3. Non-insulin-dependent diabetes.  4. Treated hypertension.  5. Treated dyslipidemia.  6. Moderate left ventricular dysfunction with an ejection fraction of 30-      35% at catheterization.   HOSPITAL COURSE:  [name] is a 75 year old male who was admitted by Dr.  Waldon Reining June 01, 2006, with unstable angina.  He has a history of coronary  disease as noted above.  He had bypass surgery in 1990 and has had several  interventions since.  The last intervention was a PTCA to the circumflex and  RCA in 2004.  Previous EF has been measured at 45%.  He presented June 01, 2006, and was started on Lovenox and nitrates.  Enzymes were negative.  He  was set up for diagnostic catheterization which was done June 02, 2006, by  Dr. Jenne Campus.  This revealed a patent LIMA to the LAD with a 95% distal  native LAD stenosis.  The LAD was totaled proximally.  The RCA had a patent  mid stent, but an 80% stenosis just proximal to the first stent.  The distal  RCA had a 60% stenosis.  Previous proximal circumflex and OM site were  patent, although the distal circumflex had a 99% occlusion and was small.  EF was 30-35%.  There was a previous SVG to the OM that is occluded.  The  patient underwent Cypher stenting to the native RCA with good results.  We  feel he can be discharged June 03, 2006.   DISCHARGE MEDICATIONS:  1. Imdur 15 mg a day has been  added.  2. He is also on coated aspirin once a day.  3. Lanoxin 0.25 mg a day.  4. Toprol-XL 50 mg a day.  5. Plavix 75 mg a day.  6. Crestor 10 mg a day.  7. Hyzaar 100/12.5 daily.  8. Actos and metformin will be held until Sunday.  9. Nitroglycerin sublingual p.r.n.   LABORATORIES:  White count 6.1, hemoglobin 11.6, hematocrit 34.7, platelets  142.  Sodium 141, potassium 4.1, BUN 13, creatinine 1.1, glucose 112.  LFTs  are normal.  Troponins are negative x3.  CK-MBs are negative.  BNP  was 30.  Digoxin level 0.9.  Chest x-ray:  No acute findings.  INR is 1.0.  EKG shows sinus rhythm, sinus bradycardia, with nonspecific ST changes,  small inferior Q's.   DISPOSITION:  The patient is discharged in stable condition and will follow  up with Dr. Clarene Duke as an outpatient.      [name].  Diona Fanti, P.A.    ______________________________  Thereasa Solo Little, M.D.    Lenard Lance  D:  06/03/2006  T:  06/03/2006  Job:  [id number]   cc:   Sharlot Gowda, M.D.

## 2011-02-19 NOTE — Discharge Summary (Signed)
NAMEDAYNE, CHAIT NO.:  [id number]   MEDICAL RECORD NO.:  [id number]          PATIENT TYPE:  INP   LOCATION:  5030                         FACILITY:  [facility]   PHYSICIAN:  Mila Homer. Sherlean Foot, M.D. DATE OF BIRTH:  [birth year]   DATE OF ADMISSION:  12/14/2004  DATE OF DISCHARGE:  12/17/2004                                 DISCHARGE SUMMARY   ADMISSION DIAGNOSES:  1.  End-stage osteoarthritis right knee.  2.  History of diabetes.  3.  History of coronary artery disease with five myocardial infarctions,      coronary artery bypass grafting, stent and recent stress test      evaluation.   DISCHARGE DIAGNOSES:  1.  Right total knee arthroplasty.  2.  Postoperative blood loss.  3.  Diabetes mellitus type 2.  4.  History of coronary artery disease and multiple myocardial infarctions.   HISTORY OF PRESENT ILLNESS:  The patient is a 75 year old white male with  intermittent 20-30 year history of progressively worsening right knee pain,  significantly worsened over the last year.  He describes the pain as a  constant deep aching sensation along the medial joint with sharp shooting  pains with awkward movements.  He does radiate up the leg into the hip.  He  denies any mechanical symptoms, but does have occasional locking and giving  out of the knee.  He does have swallowing.  He does have significant night  pain.  X-rays reveal bone-on-bone medial joint.   ALLERGIES:  NO KNOWN DRUG ALLERGIES.   CURRENT MEDICATIONS:  1.  Lanoxin 0.25 mg p.o. daily.  2.  Ecotrin 325 mg p.o. daily.  3.  Toprol-XL 50 mg p.o. daily.  4.  Cozaar 100 mg p.o. daily.  5.  Plavix 75 mg p.o. daily.  6.  Prevacid 30 mg p.o. daily.  7.  Metformin HCl 500 mg p.o. b.i.d.  8.  Viagra 50 mg p.r.n.   SURGICAL PROCEDURE:  On December 14, 2004, the patient was taken to the OR by  Dr. Georgena Spurling, M.D., assisted by Rexene Edison, PA-C.  Under general  anesthesia, the patient underwent a right total knee  arthroplasty without  any complications.  The patient had the following components implanted:  A  femoral component at his right knee a fluted stemmed tibial tray size #5, a  size 10 mm polyethylene bearing size 35 mm diameter patella component.  The  components were implanted with polymethylmethacrylate.  The patient  tolerated the procedure well.  There were no complications.  He was  transferred to recovery room on IV pain medications and antibiotics and a  CPM from the recovery room and started on Lovenox for DVT prophylaxis.   CONSULTS:  1.  Physical therapy.  2.  Occupational therapy.  3.  Rehab.   HOSPITAL COURSE:  On December 14, 2004, the patient was admitted to [facility] under the care of Dr. Georgena Spurling, M.D.  The patient was taken to  the OR, where a right total knee arthroplasty was performed without any  complications.  The patient tolerated  the procedure well and he was  transferred to the recovery room and then to the orthopedic floor in good  condition on IV pain medicines, IV antibiotics, Lovenox for DVT prophylaxis  and a CPM from the recovery room.  The patient then incurred a total of  three days postoperative care in which the patient transitioned from IV pain  medications to p.o. medications very well.  He had 24 hours of IV  antibiotics.  The patient worked well with his CPM on a day-to-day basis.  The patient did develop some slight postoperative blood loss, but he  remained asymptomatic and did not require a transfusion.  The patient's  vital signs remained stable.  He remained afebrile.  The patient had a  benign-looking would which was well approximated with staples.  His calf was  soft and nontender.  His leg works normal, vascularly intact and he had  worked well on a day-to-day basis with physical therapy and was able to  ambulate with his usual walker in excess of 200 feet with just supervision.  So on postop day #3, he was ready for discharge  home with outpatient home  health physical therapy.   LABORATORIES:  CBC on March 15, WBC 9.6, hemoglobin 10.6, hematocrit 30.6,  platelets 116,000.  Routine chemistries:  Sodium 134, potassium of 4.0,  glucose 144 (was 149 on admission).  The patient was on metformin 500 mg  p.o. b.i.d.  This was left at this dosage and recommendations for outpatient  home followup with primary care physician.  BUN 10, creatinine 1.1.  Chest x-  ray on admission shows no evidence of acute cardiac or pulmonary disease.   DISCHARGE MEDICATIONS:  1.  The patient was followed on sliding insulin scale per protocol.  2.  Colace 100 mg p.o. b.i.d.  3.  __________ one tablet p.o. t.i.d.  4.  Heparin 30 mg subcu q.12h.  5.  Lanoxin 0.25 mg p.o. daily.  6.  Toprol-XL 50 mg p.o. daily.  7.  Cozaar 100 mg p.o. daily.  8.  Protonix 40 mg p.o. daily.  9.  Glucophage 500 mg p.o. b.i.d.  10. OxyContin CR 10 mg p.o. q.12h.  11. Percocet 5 mg one or two tablets every four to six hours for      breakthrough pain.  12. Tylenol 650 mg p.o. q.4h. p.r.n.  13. __________ 500 mg p.o. q.6h. p.r.n.  14. Restoril 15 mg p.o. q.h.s. p.r.n.   DISCHARGE INSTRUCTIONS:  Medications:  The patient is to continue routine  home medications.  Percocet 5 mg one or two tablets every four to six hours p.r.n. breakthrough  pain.  OxyContin CR 10 mg one tablet every 12 hours until gone.  Lovenox 40 mg injection once a day for eight days.  Activity:  As tolerated with the use of a walker.  Diet:  No restrictions other than routine diabetic diet.  Wound care:  The patient is to keep wound clean and dry, if any concerns of  infection or any other problems, the patient is to call Dr. Tobin Chad office  for advice.  Followup:  The patient needs to call [phone] for a followup appointment  with Dr. Sherlean Foot in two weeks.  The patient needs to contact his primary care physician for evaluation of his blood sugar levels and his current diabetic  medications due to elevated  blood glucoses during his hospitalization.   CONDITION ON DISCHARGE:  Improved and good.      RWK/MEDQ  D:  12/17/2004  T:  12/17/2004  Job:  [id number]   cc:   Sharlot Gowda, M.D.  [address]  [place], [place] [address]  Fax: [phone]

## 2011-02-19 NOTE — Cardiovascular Report (Signed)
[name], CULLEN NO.:  [id number]   MEDICAL RECORD NO.:  [id number]          PATIENT TYPE:  OBV   LOCATION:  6527                         FACILITY:  [facility]   PHYSICIAN:  [name], MD  DATE OF BIRTH:  September 12, 1936   DATE OF PROCEDURE:  06/02/2006  DATE OF DISCHARGE:                              CARDIAC CATHETERIZATION   PROCEDURE:  1. Left heart catheterization.  2. Coronary angiogram.  3. Left ventriculogram.  4. Saphenous vein graft angiography.  5. Left internal mammary angiography.  6. Release of RCA/proximal of a native occlusion-placement of      intracoronary stent.   COMPLICATIONS:  None.   INDICATIONS:  Mr. Schoeneck is a 75 year old male, patient of Dr. Gaspar Garbe B.  Little, with a history of diabetes, hypertension, history of CAD with remote  bypass surgery consisting of LIMA to LAD and vein graft to OM.  He is noted  to have a totally occluded vein graft to the OM.  He has undergone  percutaneous intervention of the RCA as well as circumflex in 2004.  He was  admitted on June 01, 2006 with unstable angina.  He is now brought for  cardiac catheterization to reevaluate his coronary anatomy.   DESCRIPTION OF PROCEDURE:  After informed consent, the patient was brought  to the cardiac catheterization where right and left groin were shaved,  prepped and draped in the usual sterile fashion.  ECG monitor was  established.  Using modified Seldinger technique, a 6-French arterial sheath  was started in the right femoral artery.  A 6-French diagnostic catheter was  used to perform diagnostic angiography.   Left main is a large vessel with  no significant disease.  The LAD is  totally occluded in its proximal segment. The mid and distal LAD fill via a  patent LIMA which insertion in the mid-portion of the LAD.  This does  retrograde fill a diagonal branch.  The remainder of the LAD appears to be  patent, though there is a 95% apical LAD lesion  noted.  The IMA itself is  widely patent.   The left circumflex is a large vessel that courses to the AV groove and goes  up to one obtuse marginal branch.  AV groove circumflex is noted to have a  stent which extends from the AV circumflex into the proximal portion of the  first OM jailing the AV groove circumflex.  The AV groove circumflex is  noted to have a 99% stenosis after the takeoff of the first OM.  There is  another sequential 99% lesion in the AV groove circumflex.  This is a small  vessel.   The first OM is a large vessel which bifurcates distally.  There is a stent  extending into the first OM which is widely patent.  The remainder of the OM  is irregular but has no high-grade stenosis.  The right coronary artery is a  large vessel which is dominant.  It consists of PDA with posterolateral  branch.  There are overlapping stents noted in the mid-portion of the RCA.  There  is an 80-90% filling defect at the proximal portion of the first stent  which appears hazy and shelf-like appearance.  The remainder of the stents  appeared to be patent.   The PDA is a medium-sized vessel with no significant disease.  The PLA is a  medium-sized vessel with 60% mid-vessel lesion.  Saphenous vein graft to the  OM is totally occluded at its ostium.  Left ventriculogram reveals  moderately depressed EF of 30-35% with global hypokinesis.   HEMODYNAMICS:  Systemic arterial pressure 147/75, LV systemic pressure  158/80, LV/EDP of 23.   INTERVENTIONAL PROCEDURE:  RCA to proximal:  Following diagnostic  angiography, a 6-French JR-4 guiding catheter with side holes was engaged in  the right coronary ostium.  A 0.014 Prowater guide wire was advanced  throughout the guiding catheter and positioned in the distal PDA without  difficulty.  Following this, a Cypher 3.0 x 13 mm stent was then positioned  across the proximal portion of the mid-RCA stent.  The stent was then  deployed at 12 atmospheres  for a total of 32 seconds.  A second inflation to  16 atmospheres was performed for a total of 23 seconds.  __________ revealed  no evidence of dissection of thrombus with excellent luminal gain.  There  was TIMI III flow both pre and post-dilation.  IV Angiomax was used  throughout the case.   Final orthogonal angiograms revealed less than 10% residual stenosis in the  proximal RCA stenotic lesion with TIMI III flow to the distal vessel.  At  this point, we elected to conclude the procedure.  All balloons, wires and  catheters were removed.  Hemostatic sheaths were sewn in place and the  patient was transferred back to the ward in stable condition.   CONCLUSION:  1. Successful placement of a Cypher 3.0 x 13 mm stent in the proximal      right coronary artery stenotic lesion.  2. Patent left internal mammary artery to the left anterior descending      artery with a 95% apical left anterior descending artery lesion.  3. Totally occluded vein graft to the obtuse marginal.  4. Moderately depressed left ventricular systolic function with wall      motion abnormalities noted above.  5. Significant three vessel coronary artery disease.  6. Widely patent circumflex stent extending into the first obtuse      marginal.  7. Adjunct use of Angiomax infusion.      [name], MD  Electronically Signed     RHM/MEDQ  D:  06/02/2006  T:  06/02/2006  Job:  [id number]   cc:   Thereasa Solo. Little, M.D.

## 2011-02-19 NOTE — H&P (Signed)
NAME:  Julian, [name] NO.:  [id number]   MEDICAL RECORD NO.:  [id number]          PATIENT TYPE:  EMS   LOCATION:  MAJO                         FACILITY:  [facility]   PHYSICIAN:  Ulyses Amor, MD DATE OF BIRTH:  [birth year]   DATE OF ADMISSION:  06/01/2006  DATE OF DISCHARGE:                                HISTORY & PHYSICAL   [name] is a 75 year old white man who is admitted to [facility] for further evaluation of chest pain.   The patient has a history of coronary artery disease which dates back to  61.  At that time, he suffered a myocardial infarction.  He has  subsequently underwent coronary artery bypass grafting, receiving a left  internal mammary artery graft to the LAD and a saphenous vein graft to the  circumflex.  Since that time, he has suffered several additional myocardial  infarctions and he has also undergone several percutaneous interventions  including a PTCA of the circumflex since 1997 and a PTCA of the circumflex  and right coronary artery in 2004.  He is known to have mild left  ventricular dysfunction with an ejection fraction of approximately 45% but  he does not have congestive heart failure.   The patient presented to the emergency department after experiencing a five  hour episode of chest pain.  This began soon after lunch today while he was  sedentary.  The chest pain was described as a mid-substernal, sharp and  pressure discomfort.  It did not radiate.  It was not associated with  dyspnea, diaphoresis or nausea.  It appeared not to be related to position,  activity, meals or respiration.  There were no exacerbating or ameliorating  factors.  He took two nitroglycerin tablets which seemed to have no effect  on his discomfort, though he notes that they were old tablets.  His chest  pain resolved spontaneously.  He presented to the emergency department for  evaluation.  He is free of chest pain and otherwise  asymptomatic at this  time.   The patient believes that this chest pain is similar to that which heralded  his prior myocardial infarctions.   The patient has a number of risk factors for coronary artery disease  including hypertension, dyslipidemia and noninsulin-dependent diabetes  mellitus.   Other medical problems include osteoarthritis and gastroesophageal reflux.   MEDICATIONS:  Actos, Crestor, aspirin, Hyzaar, digoxin, metformin, Plavix,  Prevacid and Toprol.   ALLERGIES:  None.   SOCIAL HISTORY:  The patient is a retired Scientist, product/process development.  He is married  and lives with his wife.  He drinks alcohol occasionally.  He does not smoke  cigarettes.   FAMILY HISTORY:  His mother is deceased from unknown causes.  His father is  deceased from complications of coronary artery disease and diabetes.  Two  brothers are deceased from coronary artery disease.  One brother is alive.  Two sisters are alive-one with diabetes.   OPERATIONS:  Multiple back operations, left knee arthroscopy, neck surgery,  hemorrhoidectomy and right total knee arthroplasty.   REVIEW OF SYSTEMS:  Review of systems reveals no new problems related to his  head, eyes, ears, nose, mouth, throat, lungs, gastrointestinal system,  genitourinary system or extremities.  There was no history of neurologic or  psychiatric disorder.  There was no history of fever, chills or weight loss.   PHYSICAL EXAMINATION:  Blood pressure 143/73, pulse 55 and regular,  respirations 18, temperature 97.3, pulse ox 98% on room air.  The patient  was an older white man in no discomfort.  He was alert, oriented,  appropriate and responsive.  Head, eyes, nose and mouth were normal.  The  neck was without thyromegaly or adenopathy.  Carotid pulses were palpable  bilaterally and without bruits.  Cardiac examination revealed a normal S1  and S2.  There was no S3, S4, rub or click.  There was a grade 2/6 systolic  ejection murmur heard at  the left sternal border.  Cardiac rhythm was  regular.  No chest wall tenderness was noted.  The lungs were clear.  The  abdomen was soft and nontender.  There was no mass, hepatosplenomegaly,  bruit, distention, rebound, guarding or rigidity.  Bowel sounds were normal.  Rectal and genital examinations were not performed as they were not  pertinent to the reason for acute care hospitalization.  The extremities  were without edema, deviation or deformity.  Radial and dorsalis pedis  pulses were palpable bilaterally.  Brief screening neurologic survey was  unremarkable.   Electrocardiogram revealed sinus bradycardia.  The possibility of a prior  inferior myocardial infarction could not be excluded.  There were no  repolarization abnormalities specific for ischemia or infarction. The chest  radiograph, according to the radiologist, demonstrated no evidence of acute  cardiopulmonary disease.  The initial set of cardiac markers revealed a  myoglobin of 164, CK-MB of 3.6 and troponin less than 0.05.  The second set  of cardiac markers revealed a myoglobin of 107, CK-MB 2.3 and troponin less  than 0.05.  Digoxin level was 0.9.  Potassium was 4.4, BUN 20 and creatinine  1.1.  White count was 5.7 with a hemoglobin of 11.9 and a hematocrit of  35.6.  The remaining studies were pending at the time of this dictation.   IMPRESSION:  1. Chest pain:  Rule out unstable angina.  2. Coronary artery disease:  Status post multiple myocardial infarctions.      Status post coronary artery bypass surgery and multiple percutaneous      interventions, details above.  3. Mild left ventricular dysfunction.  Ejection fraction 45%.  4. Hypertension.  5. Dyslipidemia.  6. Noninsulin-dependent diabetes mellitus.  7. Osteoarthritis.  8. Gastroesophageal reflux.  9. Anemia.   PLAN:  1. Telemetry.  2. Serial cardiac enzymes.  3. Aspirin.  4. Intravenous heparin. 5. Intravenous nitroglycerin.  6. Toprol XL.   7. Further measures per Dr. Clarene Duke.      Ulyses Amor, MD  Electronically Signed     MSC/MEDQ  D:  06/01/2006  T:  06/02/2006  Job:  [id number]   cc:   Clarene Duke, M.D.

## 2011-02-19 NOTE — Cardiovascular Report (Signed)
[name], PETROPOULOS NO.:  [id number]   MEDICAL RECORD NO.:  [id number]          PATIENT TYPE:  INP   LOCATION:  3728                         FACILITY:  [facility]   PHYSICIAN:  [name], MD  DATE OF BIRTH:  Jun 10, 1936   DATE OF PROCEDURE:  12/16/2006  DATE OF DISCHARGE:  12/17/2006                            CARDIAC CATHETERIZATION   PROCEDURE:  1. Left heart catheterization.  2. Coronary angiography.  3. Left ventriculogram.  4. Left ventricular angiography.   CARDIOLOGIST:  [name], M.D.   COMPLICATIONS:  None.   INDICATIONS:  [name] is a 75 year old male patient of Dr. Gaspar Garbe B.  [name]'s, with a history of diabetes, hypertension, hyperlipidemia,  history of coronary artery disease,  status post coronary bypass surgery  consisting of a LIMA to the LAD and vein graft to the circumflex.  The  patient is known to have a totally-occluded vein graft to the  circumflex, with a stent noted in the AV groove circumflex, extending  into the second OM.  He was recently admitted with chest pain on a  repeat catheterization on June 01, 2006, revealing a proximal RCA  lesion, for which he underwent stenting using a Cypher stent, without  complication.  He was discharged home.  He was readmitted on December 16, 2006, with recurrent chest pain which was somewhat different from his  prior pain beneath his left arm and worse with inspiration, though he  did have some components similar to his prior CAD.  He is now brought  for a repeat catheterization, to reassess his recent RCA stent.   DESCRIPTION OF PROCEDURE:  After giving an informed written consent, the  patient was brought to the cardiac catheterization laboratory.  The  right groin was shaved, prepped and draped in the usual sterile fashion.  __________  was established.  Using a modified Seldinger technique,  a  #6 intra-arterial sheath in the right femoral artery.  The 6-French  diagnostic  catheters were used to perform a diagnostic angiography.   RESULTS:  1. LEFT MAIN CORONARY ARTERY:  The left main coronary artery is a      large vessel with no significant disease.  2. LEFT ANTERIOR DESCENDING CORONARY ARTERY:  The LAD is a medium-      sized vessel which is totally occluded in its proximal segment.      The mid-LAD and distal LAD fill via a patent LIMA, which inserts in      the mid-portion of the LAD and does retrograde fill one diagonal      branch.  There is no significant disease in the IMA or distal IMA      insertion.  3. LEFT CIRCUMFLEX CORONARY ARTERY:  The left circumflex coronary      artery is a medium-sized vessel and courses in the AV groove and      extending to two obtuse marginal branches.  The AV groove      circumflex is noted to have a stent extending into the first OM,      which is patent.  The  AV groove circumflex which extends beyond the      stent is noted to have a 99% ostial lesion, as well as a 90% lesion      in its mid-portion and then a lower OM which originates from the AV      groove circumflex.  The first OM is a medium-sized vessel which is      noted to have a stent extending from the AV groove circumflex into      the first OM.  The stents are widely patent.  The OM has no further      significant disease.  4. RIGHT CORONARY ARTERY:  The right coronary is a large vessel which      is dominant extending to the PDA as well as the posterior lateral      branch.  There are three overlapping stents noted in the RCA.  The      proximal stent is the recent Cypher stent, which appears to be      widely patent.  The mid-RCA stents as well are noted to be widely      patent.  The PDA is a medium-sized vessel with no significant      disease.  The PLA is a medium-sized vessel with a 50% mid-vessel      lesion.   LEFT VENTRICULOGRAM:  Reveals an ejection fraction of approximately 50%.   HEMODYNAMICS:  The __________  arterial pressure is  136/66.  The LV systemic pressure 136/04.  The LVEDP is 11.   CONCLUSION:  1. Significant three-vessel coronary artery disease.  2. Patent left internal mammary artery to the left anterior descending      coronary artery.  3. Widely patent arterioventricular groove circumflex stent extending      into the first obtuse marginal, with a 99% lesion in the      arterioventricular groove beyond the obtuse marginal stent.  This      appears to be chronic.  4. Widely patent right coronary artery stent.  5. Normal left ventricular function.      [name], MD  Electronically Signed     RHM/MEDQ  D:  12/16/2006  T:  12/18/2006  Job:  [id number]   cc:   [name], M.D.

## 2011-02-19 NOTE — Discharge Summary (Signed)
NAME:  Julian, [name]                         ACCOUNT NO.:  [id number]   MEDICAL RECORD NO.:  [id number]                   PATIENT TYPE:  INP   LOCATION:  6522                                 FACILITY:  [facility]   PHYSICIAN:  Thereasa Solo. Little, M.D.              DATE OF BIRTH:  1936/01/01   DATE OF ADMISSION:  06/13/2003  DATE OF DISCHARGE:  06/18/2003                                 DISCHARGE SUMMARY   ADMISSION DIAGNOSES:  1. Unstable angina.  2. Hypertension.  3. Adult-onset diabetes mellitus.  4. Hyperlipidemia.  5. History of multiple back surgeries.  6. History of coronary artery disease.     A. History of coronary artery bypass grafting in 1990.     B. History of angioplasty to the mid circumflex in 1997.     C. Status post cardiac catheterization 1998.  7. Normal left ventricular function, ejection fraction 55%.  8. History of neck surgery, history of hemorrhoidectomy.   DISCHARGE DIAGNOSES:  1. Unstable angina.  2. Hypertension.  3. Adult-onset diabetes mellitus.  4. Hyperlipidemia.  5. History of multiple back surgeries.  6. History of coronary artery disease.     A. History of coronary artery bypass grafting in 1990.     B. History of angioplasty to the mid circumflex in 1997.     C. Status post cardiac catheterization 1998.  7. Normal left ventricular function, ejection fraction 55%.  8. History of neck surgery, history of hemorrhoidectomy.  9. Status post cardiac catheterization on June 14, 2003 by Dr. Caprice Kluver revealing two vessel significant coronary artery disease. He     planned for repeat catheterization on the upcoming Monday with two vessel     intervention.  10.      Status post repeat cardiac catheterization on June 17, 2003     with stent to the RCA and circumflex vessels.   HISTORY OF PRESENT ILLNESS:  [name] is a 75 year old male who presented  on June 13, 2003 with a two day history of chest tightness associated  with mild  diaphoresis. These occurred for no particular reason. He tried  sublingual nitroglycerin and had immediate response. On the day of  admission, he had continued to have the same intermittent chest tightness  that lasted for about 5 to 10 minutes. He has not had to use nitroglycerin  but stays that the frequency is slightly increasing. There is no radiation  of the chest pain. There is no shortness of breath or associated  palpitations. His symptoms are similar to his prior anginal discomfort when  he had angina in the past.   At that time, he was stable with a blood pressure of 160/80, heart rate 72.  There were no significant abnormalities on physical exam. EKG shows normal  sinus rhythm, nonspecific ST changes, no new changes.   At this point, he was seen and evaluated  by Dr. Caprice Kluver. It was felt that  his symptoms were consistent with unstable angina. We planned for admission  to the hospital to check serial enzymes and rule out MI. We started on IV  heparin and IV nitroglycerin and continued his current medications. We held  the Glucophage. We planned for cardiac catheterization the following  morning. Risks and benefits of the procedure were discussed and he was  willing to proceed.   HOSPITAL COURSE:  On June 14, 2003, he underwent cardiac  catheterization by Dr. Caprice Kluver. He was found to have the following:  Widely patent LMA to LAD; occlusion of SVG to obtuse marginal which occurred  in 1997; high grade stenosis in the mid portion of the circumflex extending  into the ostial of the large obtuse marginal vessel. RCA was diffusely  diseased in the mid proximal distal segment. There was mild LV dysfunction.  Apparently at the time of his cardiac catheterization, there were no post  interventional beds available in the hospital. Therefore, because of  administrative issues, we were not able to proceed with any interventions  because of bed shortage. In view of this, the  patient was taken out of the  cardiac catheterization lab. We planned to restart IV heparin, Plavix, and  other current medicines and planned for two vessel intervention on the  upcoming Monday. He tolerated the procedure well without complications.   Over the next couple of days, [name] remained stable. He was awaiting  intervention on the upcoming Monday.   He underwent repeat cardiac catheterization by Dr. Caprice Kluver on June 17, 2003. He performed PCI to the circumflex. This went from 80% prior to  the procedure to normal post PCI. He used a Taxus stent. He then also  performed successful PCI to the RCA. He used a Taxus stent overlapped with  another Taxus stent. There was no dissection or thrombus. Angiomax infusion  was used during the PCI. The patient tolerated the procedure well and had no  complications.   On the morning of June 18, 2003, [name] remained stable. At this  point, he was stable with blood pressure 120/60, heart rate 60s to 80s in  normal sinus rhythm, no arrhythmias. He has no chest pain or shortness of  breath, even with ambulation. There was some mild ecchymosis at the calf  site but no hematoma or bleed or bruit. The remainder of exam remained  benign. Labs were stable post procedure. At this point, he was seen and  evaluated by Dr. Caprice Kluver who deemed him stable for discharge home.   HOSPITAL CONSULTS:  None.   HOSPITAL PROCEDURES:  1. Diagnostic cardiac catheterization on June 14, 2003 by Dr. Caprice Kluver. This revealed the following:  Widely patent LIMA to LAD;     occlusion of SVG to OM which had occurred back in 1997; high grade     stenosis in the mid portion of the circumflex that extended to ostium of     the large obtuse marginal vessel-this could be managed with a long stent;     RCA diffusely disease in the mid and proximal distal segment. Mild LV dysfunction. At the time of the cardiac catheterization, there was  no post  interventional beds available in the hospital. Therefore, secondary  to administrative issues, we planned to wait until the upcoming Monday for  any intervention secondary to bed shortage. The patient tolerated this  procedure well, and she has  no complications.  1. Repeat cardiac catheterization on June 17, 2003 by  Dr. Caprice Kluver.     He performed PTCA stent using Taxus stent to the circumflex. This went     from 80% to 0% residual. He then performed PTCA and stent using     overlapping Taxus stent to the RCA. The patient tolerated the procedure     well and had no complications. See the dictated cardiac catheterization     report for other details.   LABORATORY DATA:  PSA normal at 0.64. TSH normal at 3.913. Lipid profile  shows total cholesterol 149, triglycerides 229, HDL 37, LDL 66. Cardiac  enzymes were negative with CKs of 211 and 104, MB 9.0, 5.9; troponin less  than 0.01. Hemoglobin A1c 6.5. Liver function tests are normal. Admission  sodium 139, potassium 4.7, glucose 153, BUN 13, creatinine 1.0. These were  all stable post cardiac catheterization done at time of discharge; sodium  137, potassium 4.0, BUN 11, creatinine 1.1. On admission, white count 6.6,  hemoglobin 15.6, hematocrit 46.2, platelets 147; this remained stable and at  discharge white 7.3, hemoglobin 14.4, hematocrit 42.6, platelets 111. On  admission, protime 12.8, INR 0.9, PTT 31.   RADIOLOGIC FINDINGS:  Chest x-ray on June 15, 2003 shows no acute  cardiopulmonary findings.   EKG shows normal sinus rhythm, mild inferior Q waves, nonspecific ST-T  change.   DISCHARGE MEDICATIONS:  1. Plavix 75 mg once a day.  2. Aspirin 325 mg once a day.  3. Lanoxin 0.25 mg once a day.  4. Toprol-XL 50 mg once a day.  5. Zocor 40 mg each night.  6. Prevacid 30 mg a day.  7. Glucophage; do not restart it until June 19, 2003. At that time, he     will resume 500 mg twice a day.  8. Nitroglycerin sublingual  as directed for chest pain.   ACTIVITY:  No strenuous activity, lifting greater than 5 pounds, driving, or  sexual activity for three days.   DIET:  Low salt, low fat, diabetic diet.   FOLLOW UP:  Call [phone] to make an appointment to see Dr. Clarene Duke in three  to four weeks.      Mary B. Easley, P.A.-C.                   Thereasa Solo. Little, M.D.    MBE/MEDQ  D:  07/08/2003  T:  07/09/2003  Job:  [id number]   cc:   Sharlot Gowda, M.D.  [address] [place], [place] [address]  Fax: [phone]

## 2011-02-19 NOTE — Cardiovascular Report (Signed)
NAME:  [name], [name]                         ACCOUNT NO.:  [id number]   MEDICAL RECORD NO.:  [id number]                   PATIENT TYPE:  INP   LOCATION:  4742                                 FACILITY:  [facility]   PHYSICIAN:  Thereasa Solo. Little, M.D.              DATE OF BIRTH:  13-Oct-1935   DATE OF PROCEDURE:  06/17/2003  DATE OF DISCHARGE:                              CARDIAC CATHETERIZATION   INDICATIONS FOR TEST:  [name] is a 75 year old male who had had bypass  surgery in 1990.  Had lost a graft to the circumflex.  He underwent cardiac  catheterization on June 14, 2003 and was found to have high grade  stenosis in both the circumflex and a long area of stenosis in his right  coronary artery.  Because of bed shortages, administration would not allow  [place] to intervene.  Because of this he was maintained in the hospital for  three additional days.  He is brought to the laboratory for complex  intervention to both these vessels.   PROCEDURE:  The patient was prepped and draped in the usual sterile fashion  exposing the right groin.  Following local anesthetic with 1% Xylocaine the  Seldinger technique was employed and the 7-French introducer sheath was  placed into the right femoral artery.  A JL4 7-French guide catheter, a  short Luge wire was used.  The wire was placed into the distal portion of  the circumflex and a 3.0 x 20 Taxus stent was placed in such a manner that  is overlaps both the proximal and distal portion of the area of obstruction.  It was 80% narrowed before intervention.  There were a total of two  inflations 14 x 60 and 15 x 60.  Following this, the vessel appeared to be  normal with no evidence of any dissection or thrombus.   At this point the right coronary artery was then addressed.  A JR4 with side  hole guide catheters and a short Luge wire was used.  The wire was placed  down the PDA.   The lesion was approximately 40 mm in length.  Because of  this, a 2.75 x 32  Taxus stent was placed in the distal portion of the vessel well past the  last area of obstruction.  It was deployed initially at 11 x 62 with the  final inflation being 12 x 58.  Following this, a second stent a 2.75 x 12  Taxus stent was placed so that it overlapped the proximal portion of the  stent and extended all the way past the proximal area of obstruction.  It  was deployed at 14 atmospheres for 61 seconds with the final inflation being  14 atmospheres for 53 seconds.  Following this, a 3.0 x 12 Quatum balloon  was placed in the area of the overlap and a total of two inflations 15 x 65  and  16 x 75.  Following this, the long area that had been diffusely diseased  60% in the distal portion and 80% in the proximal portion now was less than  20% narrowed.  There was brisk distal flow and no evidence of any dissection  or thrombus.  Intracoronary nitroglycerin was given because of a small area  of narrowing in the ongoing right coronary artery.  This appears to be a  fixed lesion and is less than 50%.   There was no evidence of any dissection or thrombus.  The patient was  maintained on an Angiomax infusion during the PCI and it will be turned off.  The sheaths should be removed later today.  The right coronary artery was a  tight B lesion, the circumflex a type A.  There was TIMI 3 pre and post on  both vessels.   When aspirating the catheter syringes it was clear that there was some mild  atheroembolic debris in the catheter raising the concern of potential distal  atheroembolization.       ABL/MEDQ  D:  06/17/2003  T:  06/17/2003  Job:  [id number]

## 2011-02-19 NOTE — H&P (Signed)
NAME:  [name], [name]                         ACCOUNT NO.:  [id number]   MEDICAL RECORD NO.:  [id number]                   PATIENT TYPE:  INP   LOCATION:  4742                                 FACILITY:  [facility]   PHYSICIAN:  Thereasa Solo. Little, M.D.              DATE OF BIRTH:  Aug 10, 1936   DATE OF ADMISSION:  06/13/2003  DATE OF DISCHARGE:                                HISTORY & PHYSICAL   HISTORY OF PRESENT ILLNESS:  Mr. Rasmusson is a 75 year old male who has had a  two-day history of chest tightness associated with mild diaphoresis. These  occurred for no particular reason. He tried sublingual nitroglycerin and  immediate response. Today, he has continued to have the same intermittent  chest tightness that lasts 5 to 10 minutes. He has not had to use  nitroglycerin but feels that the frequency is slightly more. There is no  radiation of the chest pain. There is no shortness of breath or associated  palpitations. This is similar to the prior anginal pain he has had in the  distant past.   He has an extensive past cardiac history including bypass surgery in 1990,  angioplasty to the mid circumflex in 1997. His last catheterization in 1998  showed the LAD to be 100% occluded with internal mammary artery being widely  patent. The circumflex and right coronary artery had 30 to 40% areas of  narrowing, and the ejection fraction was 55%.   Additional medical problems included history of hypertension, adult-onset  diabetes mellitus, hyperlipidemia. He has had multiple back surgeries.   He has had neck surgery and hemorrhoidectomy.   FAMILY HISTORY:  Positive for coronary disease in his father, brothers, and  sisters. Father and brothers are hypertensive. Sister had a stroke. Diabetes  in his sister and father. Sister with breast cancer.   SOCIAL HISTORY:  The patient is married with two adult children. Ninth grade  education. No cigarettes since 1985. Occasional alcohol.   REVIEW OF  SYSTEMS:  Continues to have episodes of back pain that limits him  from doing hard strenuous physical activity. He is able to ambulate with  only minimal difficulty. He has normal bowel habits. He has no peripheral  swelling. He denies claudication complaints. He has had no PND or orthopnea.  His weight has been stable. He has had no indigestion complaints and no  blood in his bowel movement. He takes his medications as listed above. He  has had no problems with dizziness, syncope, or palpitations. No heat  intolerance.   MEDICATIONS:  Outpatient medications include:  1. Lanoxin 0.625 mg a day.  2. Toprol 50 mg XL daily.  3. Zocor 40 mg a day.  4. Enteric-coated aspirin once a day.  5. Prevacid 30 mg a day.  6. Glucophage 500 mg b.i.d. which I have placed on hold today.   PHYSICAL EXAMINATION:  VITAL SIGNS:  His blood pressure  is 160/80, pulse 72,  respirations 16.  SKIN:  Warm but moist to touch.  NECK:  Neck veins are not distended. I hear no carotid bruits.  HEENT:  Extraocular movements are intact. He does wear glasses.  LUNGS:  Clear.  CARDIAC:  Soft systolic murmur. There is no palpable chest wall tenderness.  ABDOMEN:  His abdomen is soft and there is no tenderness. I do not palpate a  liver edge. Well healed anterior chest and low back surgical scars. No  abdominal or femoral bruits are heard.  EXTREMITIES:  +2 pulses in lower extremities. No edema. Grip equal  bilaterally.   STUDIES:  EKG shows nonspecific ST-T wave changes and is unchanged from  prior tracings.   ASSESSMENT:  1. Unstable angina, very comparable to what he has had in the distant past.     Will hospitalize because of the acceleration over the last 24 hours. Will     start on IV heparin and add IV nitroglycerin if he continues to have     discomfort. Place his Glucophage on hold and plant to catheterize him     tomorrow.  2. Hypertension. Elevated today. He had been on Cozaar 50 mg a day as an      outpatient. This was stopped when he ran out of samples, and I have     restarted it today.  3. Adult-onset diabetes mellitus. Currently on Glucophage which will be     held.  4. Hyperlipidemia, on Zocor.  5. Multiple back surgeries.                                                Thereasa Solo. Little, M.D.    ABL/MEDQ  D:  06/13/2003  T:  06/14/2003  Job:  [id number]   cc:   Sharlot Gowda, M.D.  [address] [place], [place] [address]  Fax: [phone]

## 2011-02-19 NOTE — Cardiovascular Report (Signed)
NAME:  [name], [name]                         ACCOUNT NO.:  [id number]   MEDICAL RECORD NO.:  [id number]                   PATIENT TYPE:  INP   LOCATION:  4742                                 FACILITY:  [facility]   PHYSICIAN:  Thereasa Solo. Little, M.D.              DATE OF BIRTH:  1936-07-27   DATE OF PROCEDURE:  06/14/2003  DATE OF DISCHARGE:                              CARDIAC CATHETERIZATION   INDICATIONS FOR TEST:  [name] is a 75 year old male who had bypass  surgery in 1990.  He had an angioplasty in 1997 to his circumflex.  He had  previously lost a bypass graft to the circumflex vessel and only  has an  internal mammary artery to the LAD.  Patent at his last cath in 1998.  He  has had two days of increasing episodes of chest tightness associated with  mild diaphoresis resolved with sublingual nitroglycerin. He was hospitalized  on June 13, 2003 and treated with IV heparin.  His cardiac enzymes are  negative.  He was brought to the cath lab for cardiac catheterization.   The patient was prepped and draped in the usual sterile fashion exposing the  right groin. Following local anesthetic with 1% Xylocaine, the Seldinger  technique was employed and a 5 [place] introducer sheath was placed into the  right femoral artery.  Left and right coronary arteriography and  visualization of the internal mammary artery was performed all with Judkins  configuration catheters.  Ventriculography in the RAO projection and distal  aortogram was performed.   RESULTS:   HEMODYNAMIC MONITORING:  Central aortic pressure was 138/83.  Left  ventricular pressure was 142/18 with no significant valve gradient noted at  time of pullback.   DISTAL AORTOGRAM:  Distal aortogram using 20 ml of contrast at 15 ml per  second showed no evidence of renal artery stenosis and no abdominal aortic  aneurysms.  The iliacs had minimal irregularities.   VENTRICULOGRAPHY:  Ventriculography in the RAO projection  revealed the  posterior basilar segment to be akinetic.  The other segments showed  relatively normal function.  Ejection fraction 45%.  End-diastolic pressure  19.   CORONARY ARTERIOGRAPHY:  Dense calcification was noted in the distribution  of the left main, proximal LAD and proximal circumflex.   1. Left main normal.  2. Circumflex:  The circumflex was a large vessel.  There was a very large     marginal vessel that bifurcated. There was an area of 80% narrowing that     involved the ongoing circumflex and the proximal/ostial portion of the OM     system.  The distal portion of this OM vessel was free of disease.  The     ongoing circumflex was dilated with a saccular area.  Before the saccular     area, there was 70% area of narrowing and after the saccular area another  80% area of narrowing.  This saccular changes in the ongoing circumflex     and the degree of stenosis is completely unchanged from 1998.  3. LAD:  LAD is 100% occluded after the first septal perforator.  Distal     vessel fills from the graft.  4. Right coronary artery:  The entire mid and the proximal distal segment of     the right coronary artery was diffusely diseased.  There was an 80% area     of narrowing in the mid portion and another 60% area of narrowing in the     proximal segment of the distal vessel.  The entire segment between these     two was diffusely irregular.  The PDA and posterior lateral branches were     small with only mild irregularities.   GRAFTS:  The saphenous vein graft to the OM was occluded in prior caths.   Internal mammary artery to the LAD:  The IMA was widely patent.  The LAD was  widely patent and as it crossed the apex of the heart there was an area of  60% narrowing.  At this point, the vessel was about 1-1.5 mm in diameter and  there was only 30-40 mm of vessel after this that was very small.   CONCLUSION:  1. Widely patent internal mammary artery graft to the left  anterior     descending.  2. Occlusion of the saphenous vein graft to the obtuse marginal which had     occurred back in 1997.  3. High grade stenosis in mid portion of the circumflex that extends into     the ostium of the large obtuse marginal vessel.  This can be managed with     a long stent.  4. Right coronary artery diffusely diseased in the mid and proximal distal     segments.  This will take overlapping stents.  5. Mild left ventricular dysfunction.   At the time of this catheterization, there were no post interventional beds  available in this hospital.  Administration had informed all  interventionalists that we cannot proceed on with any interventions because  of bed shortage.  In view of this, the patient was taken off the table.  He  will be restarted on IV heparin, Plavix will be added to his current  medications and he will be brought back on Monday for two-vessel  intervention.                                                 Thereasa Solo. Little, M.D.    ABL/MEDQ  D:  06/14/2003  T:  06/15/2003  Job:  [id number]   cc:   Sharlot Gowda, M.D.  [address] [place], [place] [address]  Fax: [phone]

## 2011-02-19 NOTE — Discharge Summary (Signed)
NAMEVLAD, [name] NO.:  [id number]   MEDICAL RECORD NO.:  [id number]          PATIENT TYPE:  INP   LOCATION:  3728                         FACILITY:  [facility]   PHYSICIAN:  [name], MD  DATE OF BIRTH:  1936-07-18   DATE OF ADMISSION:  12/16/2006  DATE OF DISCHARGE:  12/17/2006                               DISCHARGE SUMMARY   DISCHARGE DIAGNOSES:  1. Chest pain negative, myocardial infarction and stable course.  2. Coronary artery disease with history of bypass grafting with a      patent LIMA to the LAD.  Stents to the circumflex and RCA were      patent.  EF was 50%.  3. Non-insulin-dependent diabetes mellitus.  4. Anemia.  5. Hypertension.  6. History of ischemic cardiomyopathy, currently was stable.  EF was      50%.   PROCEDURES:  Combined left heart cath with graft visualization December 16, 2006 by Dr. Lenise Herald.   DISCHARGE MEDICATIONS:  1. Aspirin 325 mg daily.  2. Lanoxin 0.125 mg daily, new dose.  3. Toprol XL 50 mg daily.  4. Crestor 20 mg daily.  5. Hyzaar 100/12.5 one daily.  6. Plavix 75 mg daily.  7. Actos/metformin 15/850, hold until December 18, 2006 p.m. then resume  8. Prevacid 30 mg daily.  9. Imdur 60 mg daily.  10.Ranexa 500 mg one twice a day.  11.May take nitroglycerin sublingual as needed for chest pain.  12.S  No Viagra until you are seen in the office.  It could cause      death if you take while taking Imdur.   DISCHARGE INSTRUCTIONS:  1. Increase activity slowly.  No walk-up steps.  May shower or bathe.      No lifting for two days.  No driving for two days.  2..  A low-sodium, heart healthy, diabetic diet.  1. Wash right groin cath site with soap and water.  Call if any      bleeding, swelling or drainage.  2. Follow up with Dr. Clarene Duke.  The office will call with date and      time.   HISTORY OF PRESENT ILLNESS:  This 75 year old white married male stated  chest pain started on Monday prior to the  admission on December 16, 2006  around 1 p.m.  It would come and go, tried nitroglycerin and eased off  Monday.  The  worst episode radiated to his back.  No shortness of  breath.  He did have pressure in his left chest.  No diaphoresis.  Some  clamminess, some pain in the right arm and left shoulder.  Need for  nitroglycerin until 4-5 from 8-10 for pain.  If he exerts he has  increased pain in his shoulder blades.   PAST MEDICAL HISTORY:  1. Coronary disease with bypass grafting x2 in LIMA to the LAD and      saphenous vein graft to the OM with occlusion of the saphenous vein      graft to the OM.  2. History of angioplasty and stent to the RCA  in the circumflex.  3. Other history includes back surgery.  4. Left knee arthroscopy.  5. Neck surgery.  6. Right total knee.  S   FAMILY HISTORY/SOCIAL HISTORY/REVIEW OF SYSTEMS:  See H&P.   PHYSICAL EXAMINATION ON DISCHARGE:  Blood pressure 134/69, oxygen  saturation 93%.  NECK:  No JVD on his neck.  HEART:  Regular rate and rhythm without S3.  LUNGS:  Lungs were clear without rales.  The right groin cath site was stable.   LABORATORY DATA:  Hemoglobin 10.8 on admission, hematocrit 32.9, WBC  5.8, platelets 170, neutrophils 63, lymphs 27, monos 9, eos 0, basos 1.  These remained stable.  Pro time 13.8, INR of 1, PTT 26.   Sodium 135, potassium 4.1, chloride 106, CO2 24, glucose 127, BUN 15,  creatinine 0.95.  These remained stable.  Total protein 6.1, albumin  3.4, AST 22, ALT 18, ALP 47, total bili 0.7, magnesium 2.1.  Glycohemoglobin was elevated at 7.8.   CKs ranged 274/216/200, MBs 5.9/5/4.1, troponin I was all 0.01 or 0.02.  Negative MI.   Total cholesterol 128, HDL 34, LDL 72, triglycerides 112, TSH 3.418.   EKGs on March 14:  Sinus rhythm, first-degree AV block, PR 213 msec,  inferior infarction age undetermined.  On follow-up on March 15, sinus  rhythm, first-degree AV block with occasional PVC.  No significant   change.   HOSPITAL COURSE:  [name] was admitted by Dr. Jenne Campus and underwent  elective cardiac catheterization on the same day of admission.  He had  found EF of 50% and patent stents to the RCA and circumflex, LIMA to LAD  was patent, saphenous vein graft to the OM had been occluded. He does  have 90% circumflex disease as well, which will be treated medically  with Ranexa and Imdur.   By December 17, 2006 the patient was stable.  Dr. Tresa Endo saw him and  adjusted his medicines and plans follow-up as an outpatient.  He was  ambulating and discharged home.      [name]      [name], MD  Electronically Signed    LRI/MEDQ  D:  02/07/2007  T:  02/07/2007  Job:  [id number]   cc:   [name], MD  Thereasa Solo Little, M.D.  Sharlot Gowda, M.D.

## 2011-03-04 ENCOUNTER — Encounter: Payer: Self-pay | Admitting: Family Medicine

## 2011-03-04 ENCOUNTER — Ambulatory Visit (INDEPENDENT_AMBULATORY_CARE_PROVIDER_SITE_OTHER): Payer: 59 | Admitting: Family Medicine

## 2011-03-04 DIAGNOSIS — E1159 Type 2 diabetes mellitus with other circulatory complications: Secondary | ICD-10-CM

## 2011-03-04 DIAGNOSIS — I5032 Chronic diastolic (congestive) heart failure: Secondary | ICD-10-CM | POA: Insufficient documentation

## 2011-03-04 DIAGNOSIS — E1169 Type 2 diabetes mellitus with other specified complication: Secondary | ICD-10-CM

## 2011-03-04 DIAGNOSIS — I2510 Atherosclerotic heart disease of native coronary artery without angina pectoris: Secondary | ICD-10-CM

## 2011-03-04 DIAGNOSIS — E118 Type 2 diabetes mellitus with unspecified complications: Secondary | ICD-10-CM | POA: Insufficient documentation

## 2011-03-04 DIAGNOSIS — Z79899 Other long term (current) drug therapy: Secondary | ICD-10-CM

## 2011-03-04 DIAGNOSIS — I10 Essential (primary) hypertension: Secondary | ICD-10-CM

## 2011-03-04 DIAGNOSIS — N529 Male erectile dysfunction, unspecified: Secondary | ICD-10-CM | POA: Insufficient documentation

## 2011-03-04 DIAGNOSIS — K219 Gastro-esophageal reflux disease without esophagitis: Secondary | ICD-10-CM | POA: Insufficient documentation

## 2011-03-04 DIAGNOSIS — E11A Type 2 diabetes mellitus without complications in remission: Secondary | ICD-10-CM | POA: Insufficient documentation

## 2011-03-04 DIAGNOSIS — E785 Hyperlipidemia, unspecified: Secondary | ICD-10-CM

## 2011-03-04 DIAGNOSIS — E119 Type 2 diabetes mellitus without complications: Secondary | ICD-10-CM | POA: Insufficient documentation

## 2011-03-04 LAB — COMPREHENSIVE METABOLIC PANEL
ALT: 32 U/L (ref 0–53)
AST: 27 U/L (ref 0–37)
Chloride: 103 mEq/L (ref 96–112)
Creat: 1.6 mg/dL — ABNORMAL HIGH (ref 0.40–1.50)
Sodium: 137 mEq/L (ref 135–145)
Total Bilirubin: 0.5 mg/dL (ref 0.3–1.2)
Total Protein: 6.8 g/dL (ref 6.0–8.3)

## 2011-03-04 LAB — CBC WITH DIFFERENTIAL/PLATELET
Basophils Absolute: 0 10*3/uL (ref 0.0–0.1)
Eosinophils Relative: 1 % (ref 0–5)
Lymphocytes Relative: 21 % (ref 12–46)
MCV: 98.3 fL (ref 78.0–100.0)
Neutrophils Relative %: 68 % (ref 43–77)
Platelets: 206 10*3/uL (ref 150–400)
RBC: 4.18 MIL/uL — ABNORMAL LOW (ref 4.22–5.81)
RDW: 13.9 % (ref 11.5–15.5)
WBC: 6 10*3/uL (ref 4.0–10.5)

## 2011-03-04 LAB — LIPID PANEL
Total CHOL/HDL Ratio: 6.3 Ratio
VLDL: 37 mg/dL (ref 0–40)

## 2011-03-04 NOTE — Progress Notes (Signed)
  Subjective:    Patient ID: [name], male    DOB: Dec 07, 1935, 75 y.o.   MRN: [id number]  HPI he is here for a medication recheck. He is checking his blood sugars and they run below 130. He usually checks them in the morning. He did have an eye exam in February. He does check his feet. No new cardiac symptoms. His medications were reviewed. He does not smoke. He saw Dr. Marianne Sofia 10 a few days ago and had an MRI on his shoulder. Also been seen recently by his neurosurgeon. He continues to have shoulder, neck and back pain. He is statin intolerant and presently is on Zetia. He has not had the opportunity to use Levitra yet. His reflux symptoms are under good control   Review of Systems Negative except as above    Objective:   Physical Exam Alert and in no distress. Hemoglobin A1c 6.9.       Assessment & Plan:  Diabetes. Hypertension. ASHD. Hyperlipidemia. ED. GERD

## 2011-03-04 NOTE — Patient Instructions (Signed)
Continue on your present medications. Recheck here in 4 months.

## 2011-03-08 ENCOUNTER — Telehealth: Payer: Self-pay

## 2011-03-08 NOTE — Telephone Encounter (Signed)
Pt informed of labs and to make apt to talk with dr Susann Givens about statin and cost

## 2011-04-06 ENCOUNTER — Encounter: Payer: Self-pay | Admitting: Family Medicine

## 2011-05-02 ENCOUNTER — Other Ambulatory Visit: Payer: Self-pay | Admitting: Family Medicine

## 2011-07-06 ENCOUNTER — Ambulatory Visit: Payer: Medicare Other | Admitting: Family Medicine

## 2011-07-12 ENCOUNTER — Encounter: Payer: Self-pay | Admitting: Family Medicine

## 2011-07-12 ENCOUNTER — Ambulatory Visit (INDEPENDENT_AMBULATORY_CARE_PROVIDER_SITE_OTHER): Payer: Medicare Other | Admitting: Family Medicine

## 2011-07-12 DIAGNOSIS — I2510 Atherosclerotic heart disease of native coronary artery without angina pectoris: Secondary | ICD-10-CM

## 2011-07-12 DIAGNOSIS — G5600 Carpal tunnel syndrome, unspecified upper limb: Secondary | ICD-10-CM

## 2011-07-12 DIAGNOSIS — E785 Hyperlipidemia, unspecified: Secondary | ICD-10-CM

## 2011-07-12 DIAGNOSIS — E1159 Type 2 diabetes mellitus with other circulatory complications: Secondary | ICD-10-CM

## 2011-07-12 DIAGNOSIS — Z79899 Other long term (current) drug therapy: Secondary | ICD-10-CM

## 2011-07-12 DIAGNOSIS — E1169 Type 2 diabetes mellitus with other specified complication: Secondary | ICD-10-CM

## 2011-07-12 DIAGNOSIS — I10 Essential (primary) hypertension: Secondary | ICD-10-CM

## 2011-07-12 DIAGNOSIS — N529 Male erectile dysfunction, unspecified: Secondary | ICD-10-CM | POA: Insufficient documentation

## 2011-07-12 DIAGNOSIS — E119 Type 2 diabetes mellitus without complications: Secondary | ICD-10-CM

## 2011-07-12 LAB — CBC WITH DIFFERENTIAL/PLATELET
Basophils Absolute: 0 K/uL (ref 0.0–0.1)
Basophils Relative: 1 % (ref 0–1)
Eosinophils Absolute: 0.1 K/uL (ref 0.0–0.7)
Eosinophils Relative: 1 % (ref 0–5)
HCT: 36.4 % — ABNORMAL LOW (ref 39.0–52.0)
Hemoglobin: 11.4 g/dL — ABNORMAL LOW (ref 13.0–17.0)
Lymphocytes Relative: 19 % (ref 12–46)
Lymphs Abs: 1.1 K/uL (ref 0.7–4.0)
MCH: 29.6 pg (ref 26.0–34.0)
MCHC: 31.3 g/dL (ref 30.0–36.0)
MCV: 94.5 fL (ref 78.0–100.0)
Monocytes Absolute: 0.6 K/uL (ref 0.1–1.0)
Monocytes Relative: 11 % (ref 3–12)
Neutro Abs: 3.8 K/uL (ref 1.7–7.7)
Neutrophils Relative %: 68 % (ref 43–77)
Platelets: 191 K/uL (ref 150–400)
RBC: 3.85 MIL/uL — ABNORMAL LOW (ref 4.22–5.81)
RDW: 14.4 % (ref 11.5–15.5)
WBC: 5.6 K/uL (ref 4.0–10.5)

## 2011-07-12 LAB — COMPREHENSIVE METABOLIC PANEL
ALT: 23 U/L (ref 0–53)
AST: 21 U/L (ref 0–37)
Albumin: 3.9 g/dL (ref 3.5–5.2)
Calcium: 9.1 mg/dL (ref 8.4–10.5)
Chloride: 108 mEq/L (ref 96–112)
Creat: 1.4 mg/dL — ABNORMAL HIGH (ref 0.50–1.35)
Potassium: 4.8 mEq/L (ref 3.5–5.3)
Sodium: 140 mEq/L (ref 135–145)

## 2011-07-12 LAB — LIPID PANEL: Total CHOL/HDL Ratio: 5.4 Ratio

## 2011-07-12 LAB — POCT GLYCOSYLATED HEMOGLOBIN (HGB A1C): Hemoglobin A1C: 6.6

## 2011-07-12 NOTE — Progress Notes (Signed)
  Subjective:    Patient ID: [name], male    DOB: 1936/03/27, 75 y.o.   MRN: [id number]  HPI He is here for a diabetes check. He continues on medications listed in the chart. He did see his orthopedic surgeon and does have evidence of CTS. He also saw his cardiologist in July. Last eye exam was February . His blood sugars run in the low 100s. He does have tingling sensation in his feet but at this time it is not causing him a great amount of discomfort. He has not yet tried the Levitra for his underlying ED. His reflux is under good control. He has no other concerns or complaints. He already got a flu shot Review of Systems     Objective:   Physical Exam Alert and in no distress otherwise not examined. Hemoglobin A1c 6.6      Assessment & Plan:   1. Diabetes mellitus  POCT HgB A1C, CBC with Differential, Comprehensive metabolic panel, Lipid panel, POCT UA - Microalbumin  2. ASHD (arteriosclerotic heart disease)  CBC with Differential, Comprehensive metabolic panel, Lipid panel  3. Hyperlipidemia LDL goal <70  Lipid panel  4. ED (erectile dysfunction)    5. Hypertension associated with diabetes  CBC with Differential, Comprehensive metabolic panel, Lipid panel  6. CTS (carpal tunnel syndrome)    7. Encounter for long-term (current) use of other medications  CBC with Differential, Comprehensive metabolic panel, Lipid panel   continue on present medications. Return here in 4 months

## 2011-07-12 NOTE — Patient Instructions (Signed)
Continue on all your present medications. See you in 4 months.

## 2011-09-17 ENCOUNTER — Telehealth: Payer: Self-pay | Admitting: Family Medicine

## 2011-09-17 MED ORDER — PIOGLITAZONE HCL-METFORMIN HCL 15-500 MG PO TABS: 1.0000 | ORAL_TABLET | Freq: Every day | ORAL | Status: DC

## 2011-09-17 NOTE — Telephone Encounter (Signed)
Actos plus called in to [facility]

## 2011-09-17 NOTE — Telephone Encounter (Signed)
Generic Acto plus called in

## 2011-09-17 NOTE — Telephone Encounter (Signed)
Pt wife stopped by on checking out and wanted to make sure the Actos Plus met was for 90 days to [facility].

## 2011-09-17 NOTE — Telephone Encounter (Signed)
Pt wants GENERIC Actos Plus Met called into [facility].

## 2011-10-06 DIAGNOSIS — I10 Essential (primary) hypertension: Secondary | ICD-10-CM | POA: Diagnosis not present

## 2011-10-06 DIAGNOSIS — I2510 Atherosclerotic heart disease of native coronary artery without angina pectoris: Secondary | ICD-10-CM | POA: Diagnosis not present

## 2011-10-06 DIAGNOSIS — E119 Type 2 diabetes mellitus without complications: Secondary | ICD-10-CM | POA: Diagnosis not present

## 2011-10-06 DIAGNOSIS — E782 Mixed hyperlipidemia: Secondary | ICD-10-CM | POA: Diagnosis not present

## 2011-11-09 ENCOUNTER — Other Ambulatory Visit: Payer: Self-pay | Admitting: Family Medicine

## 2011-11-09 NOTE — Telephone Encounter (Signed)
Is this okay?

## 2011-11-25 DIAGNOSIS — H35379 Puckering of macula, unspecified eye: Secondary | ICD-10-CM | POA: Diagnosis not present

## 2011-11-25 DIAGNOSIS — H40019 Open angle with borderline findings, low risk, unspecified eye: Secondary | ICD-10-CM | POA: Diagnosis not present

## 2011-11-25 DIAGNOSIS — H02839 Dermatochalasis of unspecified eye, unspecified eyelid: Secondary | ICD-10-CM | POA: Diagnosis not present

## 2011-11-25 DIAGNOSIS — H2510 Age-related nuclear cataract, unspecified eye: Secondary | ICD-10-CM | POA: Diagnosis not present

## 2011-12-08 ENCOUNTER — Telehealth: Payer: Self-pay | Admitting: Internal Medicine

## 2011-12-08 MED ORDER — VARDENAFIL HCL 20 MG PO TABS: 20.0000 mg | ORAL_TABLET | Freq: Every day | ORAL | Status: DC | PRN

## 2011-12-08 NOTE — Telephone Encounter (Signed)
levitra refilled

## 2012-01-07 DIAGNOSIS — H40019 Open angle with borderline findings, low risk, unspecified eye: Secondary | ICD-10-CM | POA: Diagnosis not present

## 2012-04-18 DIAGNOSIS — I2510 Atherosclerotic heart disease of native coronary artery without angina pectoris: Secondary | ICD-10-CM | POA: Diagnosis not present

## 2012-04-18 DIAGNOSIS — E119 Type 2 diabetes mellitus without complications: Secondary | ICD-10-CM | POA: Diagnosis not present

## 2012-04-18 DIAGNOSIS — E782 Mixed hyperlipidemia: Secondary | ICD-10-CM | POA: Diagnosis not present

## 2012-04-18 DIAGNOSIS — I10 Essential (primary) hypertension: Secondary | ICD-10-CM | POA: Diagnosis not present

## 2012-04-21 ENCOUNTER — Telehealth: Payer: Self-pay | Admitting: Internal Medicine

## 2012-04-21 MED ORDER — PIOGLITAZONE HCL-METFORMIN HCL 15-500 MG PO TABS: 1.0000 | ORAL_TABLET | Freq: Every day | ORAL | Status: DC

## 2012-04-21 NOTE — Telephone Encounter (Signed)
MED SENT IN 

## 2012-06-29 ENCOUNTER — Telehealth: Payer: Self-pay | Admitting: Family Medicine

## 2012-06-29 DIAGNOSIS — E119 Type 2 diabetes mellitus without complications: Secondary | ICD-10-CM

## 2012-06-29 MED ORDER — PIOGLITAZONE HCL-METFORMIN HCL 15-500 MG PO TABS: 1.0000 | ORAL_TABLET | Freq: Every day | ORAL | Status: DC

## 2012-06-29 NOTE — Telephone Encounter (Signed)
Done

## 2012-07-17 ENCOUNTER — Encounter: Payer: Self-pay | Admitting: Internal Medicine

## 2012-07-21 DIAGNOSIS — M542 Cervicalgia: Secondary | ICD-10-CM | POA: Diagnosis not present

## 2012-07-21 DIAGNOSIS — IMO0002 Reserved for concepts with insufficient information to code with codable children: Secondary | ICD-10-CM | POA: Diagnosis not present

## 2012-07-21 DIAGNOSIS — M545 Low back pain: Secondary | ICD-10-CM | POA: Diagnosis not present

## 2012-07-22 ENCOUNTER — Other Ambulatory Visit: Payer: Self-pay | Admitting: Family Medicine

## 2012-07-26 ENCOUNTER — Other Ambulatory Visit: Payer: Self-pay | Admitting: Neurological Surgery

## 2012-07-26 ENCOUNTER — Encounter: Payer: Self-pay | Admitting: Family Medicine

## 2012-07-26 ENCOUNTER — Encounter: Payer: Self-pay | Admitting: Gastroenterology

## 2012-07-26 ENCOUNTER — Ambulatory Visit (INDEPENDENT_AMBULATORY_CARE_PROVIDER_SITE_OTHER): Payer: Medicare Other | Admitting: Family Medicine

## 2012-07-26 VITALS — BP 118/80 | HR 61 | Ht 70.5 in | Wt 220.0 lb

## 2012-07-26 DIAGNOSIS — E1142 Type 2 diabetes mellitus with diabetic polyneuropathy: Secondary | ICD-10-CM

## 2012-07-26 DIAGNOSIS — M549 Dorsalgia, unspecified: Secondary | ICD-10-CM

## 2012-07-26 DIAGNOSIS — I10 Essential (primary) hypertension: Secondary | ICD-10-CM

## 2012-07-26 DIAGNOSIS — M545 Low back pain: Secondary | ICD-10-CM

## 2012-07-26 DIAGNOSIS — E785 Hyperlipidemia, unspecified: Secondary | ICD-10-CM

## 2012-07-26 DIAGNOSIS — E1149 Type 2 diabetes mellitus with other diabetic neurological complication: Secondary | ICD-10-CM | POA: Diagnosis not present

## 2012-07-26 DIAGNOSIS — E119 Type 2 diabetes mellitus without complications: Secondary | ICD-10-CM | POA: Diagnosis not present

## 2012-07-26 DIAGNOSIS — N529 Male erectile dysfunction, unspecified: Secondary | ICD-10-CM

## 2012-07-26 DIAGNOSIS — R195 Other fecal abnormalities: Secondary | ICD-10-CM

## 2012-07-26 DIAGNOSIS — K219 Gastro-esophageal reflux disease without esophagitis: Secondary | ICD-10-CM

## 2012-07-26 DIAGNOSIS — E1159 Type 2 diabetes mellitus with other circulatory complications: Secondary | ICD-10-CM

## 2012-07-26 DIAGNOSIS — E1169 Type 2 diabetes mellitus with other specified complication: Secondary | ICD-10-CM | POA: Diagnosis not present

## 2012-07-26 DIAGNOSIS — Z23 Encounter for immunization: Secondary | ICD-10-CM | POA: Diagnosis not present

## 2012-07-26 DIAGNOSIS — I2510 Atherosclerotic heart disease of native coronary artery without angina pectoris: Secondary | ICD-10-CM

## 2012-07-26 DIAGNOSIS — M542 Cervicalgia: Secondary | ICD-10-CM

## 2012-07-26 LAB — CBC WITH DIFFERENTIAL/PLATELET
Basophils Absolute: 0 10*3/uL (ref 0.0–0.1)
Eosinophils Absolute: 0 10*3/uL (ref 0.0–0.7)
Eosinophils Relative: 1 % (ref 0–5)
Lymphocytes Relative: 30 % (ref 12–46)
MCV: 93 fL (ref 78.0–100.0)
Platelets: 204 10*3/uL (ref 150–400)
RDW: 14.6 % (ref 11.5–15.5)
WBC: 4.6 10*3/uL (ref 4.0–10.5)

## 2012-07-26 LAB — POCT URINALYSIS DIPSTICK
Blood, UA: NEGATIVE
Protein, UA: NEGATIVE
Spec Grav, UA: 1.01
Urobilinogen, UA: NEGATIVE

## 2012-07-26 LAB — COMPREHENSIVE METABOLIC PANEL
ALT: 25 U/L (ref 0–53)
AST: 24 U/L (ref 0–37)
Alkaline Phosphatase: 24 U/L — ABNORMAL LOW (ref 39–117)
Creat: 1.38 mg/dL — ABNORMAL HIGH (ref 0.50–1.35)
Total Bilirubin: 0.5 mg/dL (ref 0.3–1.2)

## 2012-07-26 LAB — LIPID PANEL
Total CHOL/HDL Ratio: 6.6 Ratio
VLDL: 34 mg/dL (ref 0–40)

## 2012-07-26 LAB — POCT UA - MICROALBUMIN
Creatinine, POC: 82.7 mg/dL
Microalbumin Ur, POC: <5 mg/dL

## 2012-07-26 MED ORDER — NORTRIPTYLINE HCL 10 MG PO CAPS: 10.0000 mg | ORAL_CAPSULE | Freq: Every day | ORAL | Status: DC

## 2012-07-26 MED ORDER — PIOGLITAZONE HCL-METFORMIN HCL 15-500 MG PO TABS: 1.0000 | ORAL_TABLET | Freq: Every day | ORAL | Status: DC

## 2012-07-26 MED ORDER — INFLUENZA VIRUS VACC SPLIT PF IM SUSP: 0.5000 mL | Freq: Once | INTRAMUSCULAR | Status: DC

## 2012-07-26 MED ORDER — VARDENAFIL HCL 20 MG PO TABS: 20.0000 mg | ORAL_TABLET | Freq: Every day | ORAL | Status: DC | PRN

## 2012-07-26 NOTE — Progress Notes (Signed)
Subjective:    Patient ID: [name], male    DOB: [birth year], 76 y.o.   MRN: [id number]  HPI He is here for an interval evaluation. His main complaint today is continued difficulty with back pain. He now is seeing a neurosurgeon and has a procedure scheduled in the near future. This has definitely affected him psychologically and does have him down at times. He does complain of a tingling and aching sensation in his feet. He did try getting some diabetic shoes but found them to not be very helpful. He continues on his diabetes medications and does check his blood sugars fairly regularly. They run in the low 100s. He does check his feet and his had an eye exam this year. He continues on reflux medication and cannot get along without it. He is followed by his cardiologist regularly and is having no difficulties there. He would like a refill on his Levitra. His immunizations were reviewed.  Review of Systems Negative except as above    Objective:   Physical Exam BP 118/80  Pulse 61  Ht 5' 10.5" (1.791 m)  Wt 220 lb (99.791 kg)  BMI 31.12 kg/m2  General Appearance:    Alert, cooperative, no distress, appears stated age  Head:    Normocephalic, without obvious abnormality, atraumatic  Eyes:    PERRL, conjunctiva/corneas clear, EOM's intact, fundi    benign  Ears:    Normal TM's and external ear canals  Nose:   Nares normal, mucosa normal, no drainage or sinus   tenderness  Throat:   Lips, mucosa, and tongue normal; teeth and gums normal  Neck:   Supple, no lymphadenopathy;  thyroid:  no   enlargement/tenderness/nodules; no carotid   bruit or JVD  Back:    Spine nontender, no curvature, ROM normal, no CVA     tenderness  Lungs:     Clear to auscultation bilaterally without wheezes, rales or     ronchi; respirations unlabored  Chest Wall:    No tenderness or deformity   Heart:    Regular rate and rhythm, S1 and S2 normal, no murmur, rub   or gallop  Breast Exam:    No chest wall  tenderness, masses or gynecomastia  Abdomen:     Soft, non-tender, nondistended, normoactive bowel sounds,    no masses, no hepatosplenomegaly  Genitalia:   deferred   Rectal:    Normal sphincter tone, no masses or tenderness; guaiac positive stool.  Prostate smooth, no nodules, not enlarged.  Extremities:   No clubbing, cyanosis or edema  Pulses:   2+ dorsalis pedis on the right and no pulse appreciated in the left foot. Sensation was documented.   Skin:   Skin color, texture, turgor normal, no rashes or lesions.surgical scars noted on his chest and back   Lymph nodes:   Cervical, supraclavicular, and axillary nodes normal  Neurologic:   CNII-XII intact, normal strength, sensation and gait; reflexes 2+ and symmetric throughout          Psych:   Normal mood, affect, hygiene and grooming.           Assessment & Plan:   1. Diabetes mellitus  POCT glycosylated hemoglobin (Hb A1C), pioglitazone-metformin (ACTOPLUS MET) 15-500 MG per tablet, CBC with Differential, Comprehensive metabolic panel, Lower Extremity Arterial Doppler  2. Hypertension associated with diabetes  POCT Urinalysis Dipstick  3. Need for prophylactic vaccination and inoculation against influenza  influenza  inactive virus vaccine (FLUZONE/FLUARIX) injection 0.5 mL  4.  Back pain    5. ED (erectile dysfunction)  vardenafil (LEVITRA) 20 MG tablet  6. GERD (gastroesophageal reflux disease)    7. Hyperlipidemia LDL goal <70  Lipid panel  8. ASHD (arteriosclerotic heart disease)    9. Diabetic peripheral neuropathy  nortriptyline (PAMELOR) 10 MG capsule, Lower Extremity Arterial Doppler, POCT UA - Microalbumin  10. Stool guaiac positive  Ambulatory referral to Gastroenterology   he will followup with his neurosurgeon concerning his back pain. Continue on all his other medications. Flu shot given with discussion of risks and benefits. I will place him on low-dose nortriptyline to see if this will help with his neuropathy. He is to  return here in one month for a recheck on this. He has a scheduled procedure with cardiology and I will have Doppler study of his lower extremity.

## 2012-07-27 NOTE — Progress Notes (Signed)
Quick Note:  Pt said he can not take statins and asked about samples to help his feet please advise ______

## 2012-08-02 ENCOUNTER — Ambulatory Visit
Admission: RE | Admit: 2012-08-02 | Discharge: 2012-08-02 | Disposition: A | Discharge status: Home / Self Care | Payer: Medicare Other | Source: Ambulatory Visit | Attending: Neurological Surgery | Admitting: Neurological Surgery

## 2012-08-02 VITALS — BP 150/64 | HR 64 | Ht 69.0 in | Wt 220.0 lb

## 2012-08-02 DIAGNOSIS — M542 Cervicalgia: Secondary | ICD-10-CM

## 2012-08-02 DIAGNOSIS — M545 Low back pain: Secondary | ICD-10-CM

## 2012-08-02 MED ORDER — MEPERIDINE HCL 100 MG/ML IJ SOLN
75.0000 mg | Freq: Once | INTRAMUSCULAR | Status: AC
  Administered 2012-08-02: 75 mg via INTRAMUSCULAR

## 2012-08-02 MED ORDER — DIAZEPAM 5 MG PO TABS
5.0000 mg | ORAL_TABLET | Freq: Once | ORAL | Status: AC
  Administered 2012-08-02: 5 mg via ORAL

## 2012-08-02 MED ORDER — IOHEXOL 300 MG/ML  SOLN
10.0000 mL | Freq: Once | INTRAMUSCULAR | Status: AC | PRN
  Administered 2012-08-02: 10 mL via INTRATHECAL

## 2012-08-02 MED ORDER — ONDANSETRON HCL 4 MG/2ML IJ SOLN
4.0000 mg | Freq: Once | INTRAMUSCULAR | Status: AC
  Administered 2012-08-02: 4 mg via INTRAMUSCULAR

## 2012-08-02 NOTE — Progress Notes (Signed)
Patient states he has been off Plavix for at least five days.  Donell Sievert, RN

## 2012-08-11 ENCOUNTER — Encounter: Payer: Self-pay | Admitting: Gastroenterology

## 2012-08-11 DIAGNOSIS — M48061 Spinal stenosis, lumbar region without neurogenic claudication: Secondary | ICD-10-CM | POA: Diagnosis not present

## 2012-08-11 DIAGNOSIS — M4310 Spondylolisthesis, site unspecified: Secondary | ICD-10-CM | POA: Diagnosis not present

## 2012-08-15 ENCOUNTER — Encounter: Payer: Medicare Other | Admitting: Vascular Surgery

## 2012-08-16 ENCOUNTER — Encounter: Payer: Self-pay | Admitting: Gastroenterology

## 2012-08-16 ENCOUNTER — Ambulatory Visit (INDEPENDENT_AMBULATORY_CARE_PROVIDER_SITE_OTHER): Payer: Medicare Other | Admitting: Gastroenterology

## 2012-08-16 VITALS — BP 132/72 | HR 66 | Ht 69.0 in | Wt 222.0 lb

## 2012-08-16 DIAGNOSIS — R195 Other fecal abnormalities: Secondary | ICD-10-CM | POA: Diagnosis not present

## 2012-08-16 DIAGNOSIS — I2510 Atherosclerotic heart disease of native coronary artery without angina pectoris: Secondary | ICD-10-CM

## 2012-08-16 MED ORDER — PEG-KCL-NACL-NASULF-NA ASC-C 100 G PO SOLR: 1.0000 | Freq: Once | ORAL | Status: DC

## 2012-08-16 NOTE — Patient Instructions (Addendum)
You have been scheduled for a colonoscopy with propofol. Please follow written instructions given to you at your visit today.  Please pick up your prep kit at the pharmacy within the next 1-3 days. If you use inhalers (even only as needed) or a CPAP machine, please bring them with you on the day of your procedure.  You will be contacted by our office prior to your procedure for directions on holding your Plavix.  If you do not hear from our office 1 week prior to your scheduled procedure, please call [phone] to discuss.

## 2012-08-16 NOTE — Progress Notes (Signed)
History of Present Illness: This is a 76 year old male who is here today with his wife for evaluation of Hemoccult-positive stool. He is accompanied by his wife. He underwent colonoscopy in 1996 that was unremarkable. He states occasionally he notices a small amount of red blood on the tissue paper when wiping but he has no other gastrointestinal complaints. He has ongoing back problems apparently surgery for a disc problem is being scheduled. He has followup for a 4.6 cm abdominal aortic aneurysm scheduled. He has stable coronary artery disease, hypertension, diabetes and hyperlipidemia. He is maintained on Plavix for coronary artery disease and a prior history of stents. Denies weight loss, abdominal pain, constipation, diarrhea, change in stool caliber, melena, nausea, vomiting, dysphagia, reflux symptoms, chest pain.  Review of Systems: Pertinent positive and negative review of systems were noted in the above HPI section. All other review of systems were otherwise negative.  Current Medications, Allergies, Past Medical History, Past Surgical History, Family History and Social History were reviewed in [employer] record.  Physical Exam: General: Well developed , well nourished, no acute distress Head: Normocephalic and atraumatic Eyes:  sclerae anicteric, EOMI Ears: Normal auditory acuity Mouth: No deformity or lesions Neck: Supple, no masses or thyromegaly Lungs: Clear throughout to auscultation Heart: Regular rate and rhythm; no murmurs, rubs or bruits Abdomen: Soft, non tender and non distended. No masses, hepatosplenomegaly or hernias noted. Normal Bowel sounds Rectal: deferred to colonoscopy Musculoskeletal: Symmetrical with no gross deformities  Skin: No lesions on visible extremities Pulses:  Normal pulses noted Extremities: No clubbing, cyanosis, edema or deformities noted Neurological: Alert oriented x 4, grossly nonfocal Cervical Nodes:  No significant  cervical adenopathy Inguinal Nodes: No significant inguinal adenopathy Psychological:  Alert and cooperative. Normal mood and affect  Assessment and Recommendations:  1. Hemoccult positive stool, scant blood with wiping on occasion. His bleeding may be hemorrhoidal. Need to exclude colorectal neoplasms. Schedule colonoscopy. The risks, benefits, and alternatives to colonoscopy with possible biopsy and possible polypectomy were discussed with the patient and they consent to proceed. The risks, benefits and alternatives to a 5 day hold Plavix discussed with the patient and he consents to proceed. Will obtain clearance from his cardiologist.

## 2012-08-17 ENCOUNTER — Other Ambulatory Visit (HOSPITAL_COMMUNITY): Payer: Self-pay | Admitting: Cardiovascular Disease

## 2012-08-17 DIAGNOSIS — I714 Abdominal aortic aneurysm, without rupture: Secondary | ICD-10-CM

## 2012-08-17 DIAGNOSIS — I70219 Atherosclerosis of native arteries of extremities with intermittent claudication, unspecified extremity: Secondary | ICD-10-CM

## 2012-08-28 ENCOUNTER — Ambulatory Visit: Payer: Medicare Other | Admitting: Family Medicine

## 2012-08-30 ENCOUNTER — Ambulatory Visit (HOSPITAL_COMMUNITY)
Admission: RE | Admit: 2012-08-30 | Discharge: 2012-08-30 | Disposition: A | Discharge status: Home / Self Care | Payer: Medicare Other | Source: Ambulatory Visit | Attending: Cardiology | Admitting: Cardiology

## 2012-08-30 DIAGNOSIS — I714 Abdominal aortic aneurysm, without rupture, unspecified: Secondary | ICD-10-CM | POA: Diagnosis not present

## 2012-08-30 DIAGNOSIS — E1142 Type 2 diabetes mellitus with diabetic polyneuropathy: Secondary | ICD-10-CM

## 2012-08-30 DIAGNOSIS — E119 Type 2 diabetes mellitus without complications: Secondary | ICD-10-CM

## 2012-08-30 DIAGNOSIS — I70219 Atherosclerosis of native arteries of extremities with intermittent claudication, unspecified extremity: Secondary | ICD-10-CM | POA: Diagnosis not present

## 2012-08-30 DIAGNOSIS — Z8679 Personal history of other diseases of the circulatory system: Secondary | ICD-10-CM

## 2012-08-30 HISTORY — DX: Abdominal aortic aneurysm, without rupture, unspecified: I71.40

## 2012-08-30 HISTORY — DX: Personal history of other diseases of the circulatory system: Z86.79

## 2012-08-30 NOTE — Progress Notes (Signed)
Aorta Completed. [name]

## 2012-08-30 NOTE — Progress Notes (Signed)
Arterial Duplex Doppler Completed. [name]

## 2012-09-03 HISTORY — PX: NM MYOVIEW LTD: HXRAD82

## 2012-09-08 ENCOUNTER — Encounter: Payer: Self-pay | Admitting: Internal Medicine

## 2012-09-11 DIAGNOSIS — I10 Essential (primary) hypertension: Secondary | ICD-10-CM | POA: Diagnosis not present

## 2012-09-11 DIAGNOSIS — I2510 Atherosclerotic heart disease of native coronary artery without angina pectoris: Secondary | ICD-10-CM | POA: Diagnosis not present

## 2012-09-11 DIAGNOSIS — Z9861 Coronary angioplasty status: Secondary | ICD-10-CM | POA: Diagnosis not present

## 2012-09-11 DIAGNOSIS — Z01810 Encounter for preprocedural cardiovascular examination: Secondary | ICD-10-CM | POA: Diagnosis not present

## 2012-09-14 ENCOUNTER — Other Ambulatory Visit (HOSPITAL_COMMUNITY): Payer: Self-pay | Admitting: Cardiology

## 2012-09-14 DIAGNOSIS — R011 Cardiac murmur, unspecified: Secondary | ICD-10-CM

## 2012-09-14 DIAGNOSIS — Z8249 Family history of ischemic heart disease and other diseases of the circulatory system: Secondary | ICD-10-CM

## 2012-09-14 DIAGNOSIS — Z01818 Encounter for other preprocedural examination: Secondary | ICD-10-CM

## 2012-09-21 ENCOUNTER — Encounter: Payer: Self-pay | Admitting: Gastroenterology

## 2012-09-21 ENCOUNTER — Ambulatory Visit (AMBULATORY_SURGERY_CENTER): Payer: Medicare Other | Admitting: Gastroenterology

## 2012-09-21 VITALS — BP 145/72 | HR 48 | Temp 97.1°F | Resp 22 | Ht 69.0 in | Wt 222.0 lb

## 2012-09-21 DIAGNOSIS — D126 Benign neoplasm of colon, unspecified: Secondary | ICD-10-CM

## 2012-09-21 DIAGNOSIS — R195 Other fecal abnormalities: Secondary | ICD-10-CM | POA: Diagnosis not present

## 2012-09-21 LAB — GLUCOSE, CAPILLARY: Glucose-Capillary: 154 mg/dL — ABNORMAL HIGH (ref 70–99)

## 2012-09-21 MED ORDER — SODIUM CHLORIDE 0.9 % IV SOLN: 500.0000 mL | INTRAVENOUS | Status: DC

## 2012-09-21 NOTE — Op Note (Signed)
[facility] [address] [place], [address]   COLONOSCOPY PROCEDURE REPORT PATIENT: Julian, [name]  MR#: [id number] BIRTHDATE: 02-10-1936 , 76  yrs. old GENDER: Male ENDOSCOPIST: Meryl Dare, MD, [facility] REFERRED UX:LKGM Susann Givens, M.D. PROCEDURE DATE:  09/21/2012 PROCEDURE:   Colonoscopy with snare polypectomy and Colonoscopy with biopsy ASA CLASS:   Class III INDICATIONS:heme-positive stool. MEDICATIONS: MAC sedation, administered by CRNA and propofol (Diprivan) 180mg  IV DESCRIPTION OF PROCEDURE:   After the risks benefits and alternatives of the procedure were thoroughly explained, informed consent was obtained.  A digital rectal exam revealed no abnormalities of the rectum.   The LB CF-[id number]  endoscope was introduced through the anus and advanced to the cecum, which was identified by both the appendix and ileocecal valve. No adverse events experienced.   The quality of the prep was good, using MoviPrep  The instrument was then slowly withdrawn as the colon was fully examined.  COLON FINDINGS: A sessile polyp measuring 3 mm in size was found at the cecum.  A polypectomy was performed with cold forceps.  The resection was complete and the polyp tissue was completely retrieved.   A pedunculated polyp measuring 6 mm in size was found in the transverse colon.  A polypectomy was performed with a cold snare.  The resection was complete and the polyp tissue was completely retrieved.   A sessile polyp measuring 6 mm in size was found in the sigmoid colon.  A polypectomy was performed with a cold snare.  The resection was complete and the polyp tissue was completely retrieved.   Moderate diverticulosis was noted in the sigmoid colon, and descending colon.   The colon was otherwise normal.  There was no diverticulosis, inflammation, polyps or cancers unless previously stated.  Retroflexed views revealed moderate internal hemorrhoids. The time to cecum=1  minutes 30 seconds.  Withdrawal time=9 minutes 08 seconds.  The scope was withdrawn and the procedure completed. COMPLICATIONS: There were no complications.  ENDOSCOPIC IMPRESSION: 1.   Sessile polyp measuring 3 mm at the cecum; polypectomy performed with cold forceps 2.   Pedunculated polyp measuring 6 mm in the transverse colon; polypectomy performed with a cold snare 3.   Sessile polyp measuring 6 mm in the sigmoid colon; polypectomy performed with a cold snare 4.   Moderate diverticulosis was noted in the sigmoid colon, and descending colon 5.   Internal hemorrhoids  RECOMMENDATIONS: 1.  Await pathology results 2.  Repeat colonoscopy in 5 years if polyp adenomatous; otherwise 10 years 3.  Resume Plavix tomorrow  eSigned:  Meryl Dare, MD, [facility] 09/21/2012 10:16 AM      PATIENT NAME:  Julian, [name] MR#: [id number]

## 2012-09-21 NOTE — Progress Notes (Signed)
1005 a/ox3 pleased with MAC report to [facility]

## 2012-09-21 NOTE — Progress Notes (Signed)
Called to room to assist during endoscopic procedure.  Patient ID and intended procedure confirmed with present staff. Received instructions for my participation in the procedure from the performing physician. ewm 

## 2012-09-21 NOTE — Progress Notes (Signed)
Patient did not experience any of the following events: a burn prior to discharge; a fall within the facility; wrong site/side/patient/procedure/implant event; or a hospital transfer or hospital admission upon discharge from the facility. (G8907) Patient did not have preoperative order for IV antibiotic SSI prophylaxis. (G8918)  

## 2012-09-21 NOTE — Patient Instructions (Addendum)
Discharge instructions given with verbal understanding. Handouts on polyps,diverticulosis and hemorrhoids given. Resume previous medications. Resume Plavix tomorrow. YOU HAD AN ENDOSCOPIC PROCEDURE TODAY AT THE [facility]: Refer to the procedure report that was given to you for any specific questions about what was found during the examination.  If the procedure report does not answer your questions, please call your gastroenterologist to clarify.  If you requested that your care partner not be given the details of your procedure findings, then the procedure report has been included in a sealed envelope for you to review at your convenience later.  YOU SHOULD EXPECT: Some feelings of bloating in the abdomen. Passage of more gas than usual.  Walking can help get rid of the air that was put into your GI tract during the procedure and reduce the bloating. If you had a lower endoscopy (such as a colonoscopy or flexible sigmoidoscopy) you may notice spotting of blood in your stool or on the toilet paper. If you underwent a bowel prep for your procedure, then you may not have a normal bowel movement for a few days.  DIET: Your first meal following the procedure should be a light meal and then it is ok to progress to your normal diet.  A half-sandwich or bowl of soup is an example of a good first meal.  Heavy or fried foods are harder to digest and may make you feel nauseous or bloated.  Likewise meals heavy in dairy and vegetables can cause extra gas to form and this can also increase the bloating.  Drink plenty of fluids but you should avoid alcoholic beverages for 24 hours.  ACTIVITY: Your care partner should take you home directly after the procedure.  You should plan to take it easy, moving slowly for the rest of the day.  You can resume normal activity the day after the procedure however you should NOT DRIVE or use heavy machinery for 24 hours (because of the sedation medicines used during the  test).    SYMPTOMS TO REPORT IMMEDIATELY: A gastroenterologist can be reached at any hour.  During normal business hours, 8:30 AM to 5:00 PM Monday through Friday, call [phone].  After hours and on weekends, please call the GI answering service at [phone] who will take a message and have the physician on call contact you.   Following lower endoscopy (colonoscopy or flexible sigmoidoscopy):  Excessive amounts of blood in the stool  Significant tenderness or worsening of abdominal pains  Swelling of the abdomen that is new, acute  Fever of 100F or higher  FOLLOW UP: If any biopsies were taken you will be contacted by phone or by letter within the next 1-3 weeks.  Call your gastroenterologist if you have not heard about the biopsies in 3 weeks.  Our staff will call the home number listed on your records the next business day following your procedure to check on you and address any questions or concerns that you may have at that time regarding the information given to you following your procedure. This is a courtesy call and so if there is no answer at the home number and we have not heard from you through the emergency physician on call, we will assume that you have returned to your regular daily activities without incident.  SIGNATURES/CONFIDENTIALITY: You and/or your care partner have signed paperwork which will be entered into your electronic medical record.  These signatures attest to the fact that that the information above on  your After Visit Summary has been reviewed and is understood.  Full responsibility of the confidentiality of this discharge information lies with you and/or your care-partner.

## 2012-09-22 ENCOUNTER — Ambulatory Visit (HOSPITAL_COMMUNITY)
Admission: RE | Admit: 2012-09-22 | Discharge: 2012-09-22 | Disposition: A | Discharge status: Home / Self Care | Payer: Medicare Other | Source: Ambulatory Visit | Attending: Cardiology | Admitting: Cardiology

## 2012-09-22 ENCOUNTER — Telehealth: Payer: Self-pay

## 2012-09-22 DIAGNOSIS — R002 Palpitations: Secondary | ICD-10-CM | POA: Diagnosis not present

## 2012-09-22 DIAGNOSIS — I10 Essential (primary) hypertension: Secondary | ICD-10-CM | POA: Insufficient documentation

## 2012-09-22 DIAGNOSIS — Z01818 Encounter for other preprocedural examination: Secondary | ICD-10-CM | POA: Insufficient documentation

## 2012-09-22 DIAGNOSIS — R0989 Other specified symptoms and signs involving the circulatory and respiratory systems: Secondary | ICD-10-CM | POA: Insufficient documentation

## 2012-09-22 DIAGNOSIS — E669 Obesity, unspecified: Secondary | ICD-10-CM | POA: Diagnosis not present

## 2012-09-22 DIAGNOSIS — E119 Type 2 diabetes mellitus without complications: Secondary | ICD-10-CM | POA: Diagnosis not present

## 2012-09-22 DIAGNOSIS — R011 Cardiac murmur, unspecified: Secondary | ICD-10-CM

## 2012-09-22 DIAGNOSIS — Z01810 Encounter for preprocedural cardiovascular examination: Secondary | ICD-10-CM | POA: Diagnosis not present

## 2012-09-22 DIAGNOSIS — E785 Hyperlipidemia, unspecified: Secondary | ICD-10-CM | POA: Insufficient documentation

## 2012-09-22 DIAGNOSIS — Z951 Presence of aortocoronary bypass graft: Secondary | ICD-10-CM | POA: Diagnosis not present

## 2012-09-22 DIAGNOSIS — I2510 Atherosclerotic heart disease of native coronary artery without angina pectoris: Secondary | ICD-10-CM | POA: Diagnosis not present

## 2012-09-22 DIAGNOSIS — R079 Chest pain, unspecified: Secondary | ICD-10-CM | POA: Diagnosis not present

## 2012-09-22 DIAGNOSIS — Z8249 Family history of ischemic heart disease and other diseases of the circulatory system: Secondary | ICD-10-CM

## 2012-09-22 DIAGNOSIS — R0609 Other forms of dyspnea: Secondary | ICD-10-CM | POA: Diagnosis not present

## 2012-09-22 DIAGNOSIS — Z9861 Coronary angioplasty status: Secondary | ICD-10-CM | POA: Diagnosis not present

## 2012-09-22 MED ORDER — TECHNETIUM TC 99M SESTAMIBI GENERIC - CARDIOLITE
30.6000 | Freq: Once | INTRAVENOUS | Status: AC | PRN
  Administered 2012-09-22: 31 via INTRAVENOUS

## 2012-09-22 MED ORDER — TECHNETIUM TC 99M SESTAMIBI GENERIC - CARDIOLITE
11.0000 | Freq: Once | INTRAVENOUS | Status: AC | PRN
  Administered 2012-09-22: 11 via INTRAVENOUS

## 2012-09-22 MED ORDER — REGADENOSON 0.4 MG/5ML IV SOLN
0.4000 mg | Freq: Once | INTRAVENOUS | Status: AC
  Administered 2012-09-22: 0.4 mg via INTRAVENOUS

## 2012-09-22 NOTE — Telephone Encounter (Signed)
Left message on answering machine. 

## 2012-09-22 NOTE — Procedures (Addendum)
[facility] [address] [place] [address] [place] [address] [phone]  Cardiology Nuclear Med Study  [name] is a 76 y.o. male     MRN : [id number]     DOB: 1936-07-03  Procedure Date: 09/22/2012  Nuclear Med Background Indication for Stress Test:  Evaluation for Ischemia,Graft Patency and PTCA/Stent Patency, Abnormal EKG,and Pending Surgical Clearance for Back Surgery by Dr. Yetta Barre History:  '85 LWMI ,'95 PTCA, '97 Stent,11-13-95 CABG x 3, AAA, 12-17-08 R/LMV Nonischemic, EF=51% Cardiac Risk Factors: Family History - CAD, Hypertension, Lipids, NIDDM and Obesity  Symptoms:  Chest Pain, DOE and Palpitations   Nuclear Pre-Procedure Caffeine/Decaff Intake:  10:00pm NPO After: 8:00am   IV Site: R Antecubital x 1, tolerated well IV 0.9% NS with Angio Cath:  22g  Chest Size (in):  42 IV Started by: Irean Hong, RN  Height: 5\' 11"  (1.803 m)  Cup Size: n/a  BMI:  Body mass index is 31.38 kg/(m^2). Weight:  225 lb (102.059 kg)   Tech Comments:  Took Metoprolol this am.     Nuclear Med Study 1 or 2 day study: 1 day  Stress Test Type:  Lexiscan  Order Authorizing Provider:  Bryan Lemma, MD   Resting Radionuclide: Technetium 87m Sestamibi  Resting Radionuclide Dose: 11.0 mCi   Stress Radionuclide:  Technetium 69m Sestamibi  Stress Radionuclide Dose: 30.6 mCi           Stress Protocol Rest HR: 55 Stress HR: 77  Rest BP: 149/77 Stress BP:161/84  Exercise Time (min): n/a METS: n/a          Dose of Adenosine (mg):  n/a Dose of Lexiscan: 0.4 mg  Dose of Atropine (mg): n/a Dose of Dobutamine: n/a mcg/kg/min (at max HR)  Stress Test Technologist: Ernestene Mention, CCT Nuclear Technologist: Gonzella Lex, CNMT   Rest Procedure:  Myocardial perfusion imaging was performed at rest 45 minutes following the intravenous administration of Technetium 91m Sestamibi. Stress Procedure:  The patient received IV Lexiscan 0.4 mg over 15-seconds.   Technetium 53m Sestamibi injected at 30-seconds.  There were no significant changes with Lexiscan.  Quantitative spect images were obtained after a 45 minute delay.  Transient Ischemic Dilatation (Normal <1.22):  1.12 Lung/Heart Ratio (Normal <0.45):  0.24 QGS EDV:  165 ml QGS ESV:  88 ml LV Ejection Fraction: 47%     Rest ECG: NSR - Normal EKG and isolated PVC.  Stress ECG: No significant change from baseline ECG and There are scattered PVCs.  QPS Raw Data Images:  Normal; no motion artifact; normal heart/lung ratio. Stress Images:  Mid to basal inferior to inferolateral defect. Rest Images:  Comparison with the stress images reveals no significant change. Subtraction (SDS):  No evidence of ischemia.  Impression Exercise Capacity:  Lexiscan with no exercise. BP Response:  Normal blood pressure response. Clinical Symptoms:  No significant symptoms noted. ECG Impression:  No significant ST segment change suggestive of ischemia. Comparison with Prior Nuclear Study: In comparison to prior 2010 study, inferior defect appears more prominent  Overall Impression:  Low risk stress nuclear study demonstrating a moderate region on mid to basal inferior to inferolateral scar without significant ischemia.  LV Wall Motion:  Mild apical hypokinesis with EF 47%.   Lennette Bihari, MD  09/22/2012 3:48 PM

## 2012-09-22 NOTE — Progress Notes (Signed)
[facility]   2D echo completed 09/22/2012.   Veda Canning, RDCS

## 2012-09-29 ENCOUNTER — Encounter: Payer: Self-pay | Admitting: Gastroenterology

## 2012-10-24 ENCOUNTER — Other Ambulatory Visit: Payer: Self-pay | Admitting: Family Medicine

## 2012-11-03 ENCOUNTER — Other Ambulatory Visit: Payer: Self-pay | Admitting: Neurological Surgery

## 2012-11-04 DIAGNOSIS — Z8701 Personal history of pneumonia (recurrent): Secondary | ICD-10-CM

## 2012-11-04 HISTORY — DX: Personal history of pneumonia (recurrent): Z87.01

## 2012-11-09 ENCOUNTER — Encounter (HOSPITAL_COMMUNITY): Payer: Self-pay | Admitting: Pharmacist

## 2012-11-09 ENCOUNTER — Telehealth: Payer: Self-pay | Admitting: Internal Medicine

## 2012-11-09 MED ORDER — GLUCOSE BLOOD VI STRP: ORAL_STRIP | Status: DC

## 2012-11-09 NOTE — Telephone Encounter (Signed)
Pt is completely out of his test strips and needs test strips sent to the pharmacy. He uses [employer] elite test strips. Send to [address]

## 2012-11-09 NOTE — Telephone Encounter (Signed)
done

## 2012-11-15 ENCOUNTER — Encounter (HOSPITAL_COMMUNITY)
Admission: RE | Admit: 2012-11-15 | Discharge: 2012-11-15 | Disposition: A | Discharge status: Home / Self Care | Payer: Medicare Other | Source: Ambulatory Visit | Attending: Neurological Surgery | Admitting: Neurological Surgery

## 2012-11-15 ENCOUNTER — Encounter (HOSPITAL_COMMUNITY): Payer: Self-pay

## 2012-11-15 DIAGNOSIS — Z01812 Encounter for preprocedural laboratory examination: Secondary | ICD-10-CM | POA: Diagnosis not present

## 2012-11-15 DIAGNOSIS — Z79899 Other long term (current) drug therapy: Secondary | ICD-10-CM | POA: Diagnosis not present

## 2012-11-15 DIAGNOSIS — I252 Old myocardial infarction: Secondary | ICD-10-CM | POA: Diagnosis not present

## 2012-11-15 DIAGNOSIS — M48061 Spinal stenosis, lumbar region without neurogenic claudication: Secondary | ICD-10-CM | POA: Diagnosis not present

## 2012-11-15 DIAGNOSIS — M4310 Spondylolisthesis, site unspecified: Secondary | ICD-10-CM | POA: Diagnosis not present

## 2012-11-15 DIAGNOSIS — Z951 Presence of aortocoronary bypass graft: Secondary | ICD-10-CM | POA: Diagnosis not present

## 2012-11-15 DIAGNOSIS — I509 Heart failure, unspecified: Secondary | ICD-10-CM | POA: Diagnosis not present

## 2012-11-15 DIAGNOSIS — Z01818 Encounter for other preprocedural examination: Secondary | ICD-10-CM | POA: Diagnosis not present

## 2012-11-15 DIAGNOSIS — K219 Gastro-esophageal reflux disease without esophagitis: Secondary | ICD-10-CM | POA: Diagnosis not present

## 2012-11-15 DIAGNOSIS — M545 Low back pain: Secondary | ICD-10-CM | POA: Diagnosis not present

## 2012-11-15 DIAGNOSIS — Z7902 Long term (current) use of antithrombotics/antiplatelets: Secondary | ICD-10-CM | POA: Diagnosis not present

## 2012-11-15 DIAGNOSIS — E119 Type 2 diabetes mellitus without complications: Secondary | ICD-10-CM | POA: Diagnosis not present

## 2012-11-15 DIAGNOSIS — Z9861 Coronary angioplasty status: Secondary | ICD-10-CM | POA: Diagnosis not present

## 2012-11-15 DIAGNOSIS — E785 Hyperlipidemia, unspecified: Secondary | ICD-10-CM | POA: Diagnosis present

## 2012-11-15 DIAGNOSIS — I2510 Atherosclerotic heart disease of native coronary artery without angina pectoris: Secondary | ICD-10-CM | POA: Diagnosis not present

## 2012-11-15 DIAGNOSIS — G4730 Sleep apnea, unspecified: Secondary | ICD-10-CM | POA: Diagnosis present

## 2012-11-15 DIAGNOSIS — I10 Essential (primary) hypertension: Secondary | ICD-10-CM | POA: Diagnosis present

## 2012-11-15 DIAGNOSIS — M549 Dorsalgia, unspecified: Secondary | ICD-10-CM | POA: Diagnosis not present

## 2012-11-15 DIAGNOSIS — Z96649 Presence of unspecified artificial hip joint: Secondary | ICD-10-CM | POA: Diagnosis not present

## 2012-11-15 DIAGNOSIS — Z7982 Long term (current) use of aspirin: Secondary | ICD-10-CM | POA: Diagnosis not present

## 2012-11-15 DIAGNOSIS — Z87891 Personal history of nicotine dependence: Secondary | ICD-10-CM | POA: Diagnosis not present

## 2012-11-15 DIAGNOSIS — Q762 Congenital spondylolisthesis: Secondary | ICD-10-CM | POA: Diagnosis not present

## 2012-11-15 HISTORY — DX: Sleep apnea, unspecified: G47.30

## 2012-11-15 LAB — CBC WITH DIFFERENTIAL/PLATELET
Hemoglobin: 13.2 g/dL (ref 13.0–17.0)
Lymphocytes Relative: 22 % (ref 12–46)
Lymphs Abs: 1.4 10*3/uL (ref 0.7–4.0)
MCV: 93.3 fL (ref 78.0–100.0)
Neutrophils Relative %: 69 % (ref 43–77)
Platelets: 202 10*3/uL (ref 150–400)
RBC: 4.19 MIL/uL — ABNORMAL LOW (ref 4.22–5.81)
WBC: 6.4 10*3/uL (ref 4.0–10.5)

## 2012-11-15 LAB — BASIC METABOLIC PANEL
CO2: 25 mEq/L (ref 19–32)
Calcium: 10 mg/dL (ref 8.4–10.5)
GFR calc non Af Amer: 52 mL/min — ABNORMAL LOW (ref >90)
Potassium: 4.1 mEq/L (ref 3.5–5.1)
Sodium: 138 mEq/L (ref 135–145)

## 2012-11-15 LAB — PROTIME-INR
INR: 0.95 (ref 0.00–1.49)
Prothrombin Time: 12.6 seconds (ref 11.6–15.2)

## 2012-11-15 LAB — SURGICAL PCR SCREEN: Staphylococcus aureus: POSITIVE — AB

## 2012-11-15 LAB — ABO/RH: ABO/RH(D): A POS

## 2012-11-15 LAB — TYPE AND SCREEN: Antibody Screen: NEGATIVE

## 2012-11-15 NOTE — Progress Notes (Signed)
Sleep apnea tool sent to Dr. Susann Givens

## 2012-11-15 NOTE — Progress Notes (Signed)
11/15/12 1615  OBSTRUCTIVE SLEEP APNEA  Have you ever been diagnosed with sleep apnea through a sleep study? Yes  If yes, do you have and use a CPAP or BPAP machine every night? 0  Do you snore loudly (loud enough to be heard through closed doors)?  1  Do you often feel tired, fatigued, or sleepy during the daytime? 1  Has anyone observed you stop breathing during your sleep? 1  Do you have, or are you being treated for high blood pressure? 1  BMI more than 35 kg/m2? 0  Age over 79 years old? 1  Neck circumference greater than 40 cm/18 inches? 0  Gender: 1  Obstructive Sleep Apnea Score 6

## 2012-11-15 NOTE — Progress Notes (Signed)
Pt. Followed by Dr. Anthonette Legato, [facility], no chest pain, or diff. Breathing, + SOB- but pt. Reports that is his baseline . Pt. Remarks that he was reviewed BY [facility] for this surg., states he is  Cleared.

## 2012-11-15 NOTE — Pre-Procedure Instructions (Signed)
ROTH RESS  11/15/2012   Your procedure is scheduled on:  Wednesday November 22, 2012  Report to [facility] at 6:30 AM.  Call this number if you have problems the morning of surgery: [phone]   Remember:   Do not eat food or drink liquids after midnight.   Take these medicines the morning of surgery with A SIP OF WATER: digoxin, metoprolol, omeprazole,    Do not wear jewelry, make-up or nail polish.  Do not wear lotions, powders, or perfumes. You may wear deodorant.  Do not shave 48 hours prior to surgery. Men can shave face and neck  Do not bring valuables to the hospital.  Contacts, dentures or bridgework may not be worn into surgery.  Leave suitcase in the car. After surgery it may be brought to your room.    Patients discharged the day of surgery will not be allowed to drive  home.  Name and phone number of your driver: family / friend  Special Instructions: Shower using CHG 2 nights before surgery and the night before surgery.  If you shower the day of surgery use CHG.  Use special wash - you have one bottle of CHG for all showers.  You should use approximately 1/3 of the bottle for each shower.   Please read over the following fact sheets that you were given: Pain Booklet, Coughing and Deep Breathing, Blood Transfusion Information, MRSA Information and Surgical Site Infection Prevention

## 2012-11-15 NOTE — Progress Notes (Signed)
Call to [facility], spoke with voicemail for office admin., Amy Hudson, due to not getting response on MED. Records line for records request. Will fax request for record request.

## 2012-11-21 MED ORDER — DEXAMETHASONE SODIUM PHOSPHATE 10 MG/ML IJ SOLN
10.0000 mg | INTRAMUSCULAR | Status: AC
  Administered 2012-11-22: 10 mg via INTRAVENOUS
  Filled 2012-11-21: qty 1

## 2012-11-21 MED ORDER — CEFAZOLIN SODIUM-DEXTROSE 2-3 GM-% IV SOLR
2.0000 g | INTRAVENOUS | Status: AC
  Administered 2012-11-22: 2 g via INTRAVENOUS
  Filled 2012-11-21: qty 50

## 2012-11-22 ENCOUNTER — Inpatient Hospital Stay (HOSPITAL_COMMUNITY): Payer: Medicare Other

## 2012-11-22 ENCOUNTER — Inpatient Hospital Stay (HOSPITAL_COMMUNITY)
Admission: RE | Admit: 2012-11-22 | Discharge: 2012-11-23 | DRG: 460 | Disposition: A | Discharge status: Home / Self Care | Payer: Medicare Other | Source: Ambulatory Visit | Attending: Neurological Surgery | Admitting: Neurological Surgery

## 2012-11-22 ENCOUNTER — Encounter (HOSPITAL_COMMUNITY): Payer: Self-pay | Admitting: Certified Registered Nurse Anesthetist

## 2012-11-22 ENCOUNTER — Encounter (HOSPITAL_COMMUNITY)
Admission: RE | Disposition: A | Discharge status: Home / Self Care | Payer: Self-pay | Source: Ambulatory Visit | Attending: Neurological Surgery

## 2012-11-22 ENCOUNTER — Encounter (HOSPITAL_COMMUNITY): Payer: Self-pay | Admitting: Neurological Surgery

## 2012-11-22 ENCOUNTER — Inpatient Hospital Stay (HOSPITAL_COMMUNITY): Payer: Medicare Other | Admitting: Certified Registered Nurse Anesthetist

## 2012-11-22 DIAGNOSIS — Z79899 Other long term (current) drug therapy: Secondary | ICD-10-CM

## 2012-11-22 DIAGNOSIS — Z7982 Long term (current) use of aspirin: Secondary | ICD-10-CM

## 2012-11-22 DIAGNOSIS — Z951 Presence of aortocoronary bypass graft: Secondary | ICD-10-CM

## 2012-11-22 DIAGNOSIS — Z96649 Presence of unspecified artificial hip joint: Secondary | ICD-10-CM

## 2012-11-22 DIAGNOSIS — I252 Old myocardial infarction: Secondary | ICD-10-CM

## 2012-11-22 DIAGNOSIS — M48061 Spinal stenosis, lumbar region without neurogenic claudication: Secondary | ICD-10-CM | POA: Diagnosis not present

## 2012-11-22 DIAGNOSIS — Z9861 Coronary angioplasty status: Secondary | ICD-10-CM

## 2012-11-22 DIAGNOSIS — I10 Essential (primary) hypertension: Secondary | ICD-10-CM | POA: Diagnosis present

## 2012-11-22 DIAGNOSIS — Z981 Arthrodesis status: Secondary | ICD-10-CM

## 2012-11-22 DIAGNOSIS — I2510 Atherosclerotic heart disease of native coronary artery without angina pectoris: Secondary | ICD-10-CM | POA: Diagnosis present

## 2012-11-22 DIAGNOSIS — I509 Heart failure, unspecified: Secondary | ICD-10-CM | POA: Diagnosis present

## 2012-11-22 DIAGNOSIS — Q762 Congenital spondylolisthesis: Principal | ICD-10-CM

## 2012-11-22 DIAGNOSIS — M4310 Spondylolisthesis, site unspecified: Secondary | ICD-10-CM | POA: Diagnosis not present

## 2012-11-22 DIAGNOSIS — Z7902 Long term (current) use of antithrombotics/antiplatelets: Secondary | ICD-10-CM

## 2012-11-22 DIAGNOSIS — Z87891 Personal history of nicotine dependence: Secondary | ICD-10-CM

## 2012-11-22 DIAGNOSIS — G4730 Sleep apnea, unspecified: Secondary | ICD-10-CM | POA: Diagnosis present

## 2012-11-22 DIAGNOSIS — Z01812 Encounter for preprocedural laboratory examination: Secondary | ICD-10-CM

## 2012-11-22 DIAGNOSIS — E785 Hyperlipidemia, unspecified: Secondary | ICD-10-CM | POA: Diagnosis present

## 2012-11-22 DIAGNOSIS — K219 Gastro-esophageal reflux disease without esophagitis: Secondary | ICD-10-CM | POA: Diagnosis present

## 2012-11-22 DIAGNOSIS — E119 Type 2 diabetes mellitus without complications: Secondary | ICD-10-CM | POA: Diagnosis present

## 2012-11-22 DIAGNOSIS — Z01818 Encounter for other preprocedural examination: Secondary | ICD-10-CM

## 2012-11-22 HISTORY — PX: ANTERIOR LAT LUMBAR FUSION: SHX1168

## 2012-11-22 HISTORY — PX: LUMBAR PERCUTANEOUS PEDICLE SCREW 1 LEVEL: SHX5560

## 2012-11-22 LAB — GLUCOSE, CAPILLARY
Glucose-Capillary: 131 mg/dL — ABNORMAL HIGH (ref 70–99)
Glucose-Capillary: 181 mg/dL — ABNORMAL HIGH (ref 70–99)

## 2012-11-22 SURGERY — ANTERIOR LATERAL LUMBAR FUSION 1 LEVEL
Anesthesia: General | Wound class: Clean

## 2012-11-22 MED ORDER — DIGOXIN 125 MCG PO TABS
0.1250 mg | ORAL_TABLET | Freq: Every day | ORAL | Status: DC
  Filled 2012-11-22: qty 1

## 2012-11-22 MED ORDER — ACETAMINOPHEN 10 MG/ML IV SOLN
1000.0000 mg | Freq: Four times a day (QID) | INTRAVENOUS | Status: DC
  Administered 2012-11-22 – 2012-11-23 (×3): 1000 mg via INTRAVENOUS
  Filled 2012-11-22 (×4): qty 100

## 2012-11-22 MED ORDER — ACETAMINOPHEN 10 MG/ML IV SOLN
INTRAVENOUS | Status: AC
  Administered 2012-11-22: 1000 mg via INTRAVENOUS
  Filled 2012-11-22: qty 100

## 2012-11-22 MED ORDER — OXYCODONE HCL 5 MG PO TABS: 5.0000 mg | ORAL_TABLET | ORAL | Status: DC | PRN

## 2012-11-22 MED ORDER — LOSARTAN POTASSIUM 50 MG PO TABS
100.0000 mg | ORAL_TABLET | Freq: Every day | ORAL | Status: DC
  Filled 2012-11-22: qty 2

## 2012-11-22 MED ORDER — ADULT MULTIVITAMIN W/MINERALS CH
1.0000 | ORAL_TABLET | Freq: Every day | ORAL | Status: DC
  Filled 2012-11-22: qty 1

## 2012-11-22 MED ORDER — 0.9 % SODIUM CHLORIDE (POUR BTL) OPTIME
TOPICAL | Status: DC | PRN
  Administered 2012-11-22: 1000 mL

## 2012-11-22 MED ORDER — LOSARTAN POTASSIUM-HCTZ 100-12.5 MG PO TABS: 1.0000 | ORAL_TABLET | Freq: Every day | ORAL | Status: DC

## 2012-11-22 MED ORDER — POTASSIUM CHLORIDE IN NACL 20-0.9 MEQ/L-% IV SOLN
INTRAVENOUS | Status: DC
  Filled 2012-11-22 (×3): qty 1000

## 2012-11-22 MED ORDER — ACETAMINOPHEN 325 MG PO TABS: 650.0000 mg | ORAL_TABLET | ORAL | Status: DC | PRN

## 2012-11-22 MED ORDER — HYDROMORPHONE HCL PF 1 MG/ML IJ SOLN
INTRAMUSCULAR | Status: AC
  Filled 2012-11-22: qty 1

## 2012-11-22 MED ORDER — PIOGLITAZONE HCL-METFORMIN HCL 15-500 MG PO TABS: 1.0000 | ORAL_TABLET | Freq: Every day | ORAL | Status: DC

## 2012-11-22 MED ORDER — MORPHINE SULFATE 2 MG/ML IJ SOLN: 1.0000 mg | INTRAMUSCULAR | Status: DC | PRN

## 2012-11-22 MED ORDER — LIDOCAINE HCL (CARDIAC) 20 MG/ML IV SOLN
INTRAVENOUS | Status: DC | PRN
  Administered 2012-11-22: 50 mg via INTRAVENOUS

## 2012-11-22 MED ORDER — MENTHOL 3 MG MT LOZG: 1.0000 | LOZENGE | OROMUCOSAL | Status: DC | PRN

## 2012-11-22 MED ORDER — OXYCODONE HCL 5 MG/5ML PO SOLN: 5.0000 mg | Freq: Once | ORAL | Status: AC | PRN

## 2012-11-22 MED ORDER — SUCCINYLCHOLINE CHLORIDE 20 MG/ML IJ SOLN
INTRAMUSCULAR | Status: DC | PRN
  Administered 2012-11-22: 100 mg via INTRAVENOUS

## 2012-11-22 MED ORDER — DEXTROSE 5 % IV SOLN
500.0000 mg | Freq: Four times a day (QID) | INTRAVENOUS | Status: DC | PRN
  Filled 2012-11-22: qty 5

## 2012-11-22 MED ORDER — SODIUM CHLORIDE 0.9 % IJ SOLN: 3.0000 mL | INTRAMUSCULAR | Status: DC | PRN

## 2012-11-22 MED ORDER — HYDROCHLOROTHIAZIDE 12.5 MG PO CAPS
12.5000 mg | ORAL_CAPSULE | Freq: Every day | ORAL | Status: DC
  Filled 2012-11-22: qty 1

## 2012-11-22 MED ORDER — PROPOFOL 10 MG/ML IV BOLUS
INTRAVENOUS | Status: DC | PRN
  Administered 2012-11-22: 130 mg via INTRAVENOUS

## 2012-11-22 MED ORDER — LIDOCAINE HCL 4 % MT SOLN
OROMUCOSAL | Status: DC | PRN
  Administered 2012-11-22: 4 mL via TOPICAL

## 2012-11-22 MED ORDER — METFORMIN HCL 500 MG PO TABS
500.0000 mg | ORAL_TABLET | Freq: Every day | ORAL | Status: DC
  Administered 2012-11-23: 500 mg via ORAL
  Filled 2012-11-22 (×2): qty 1

## 2012-11-22 MED ORDER — PROPOFOL INFUSION 10 MG/ML OPTIME
INTRAVENOUS | Status: DC | PRN
  Administered 2012-11-22: 75 ug/kg/min via INTRAVENOUS

## 2012-11-22 MED ORDER — SODIUM CHLORIDE 0.9 % IR SOLN
Status: DC | PRN
  Administered 2012-11-22: 10:00:00

## 2012-11-22 MED ORDER — CEFAZOLIN SODIUM 1-5 GM-% IV SOLN
1.0000 g | Freq: Three times a day (TID) | INTRAVENOUS | Status: AC
  Administered 2012-11-22 (×2): 1 g via INTRAVENOUS
  Filled 2012-11-22 (×2): qty 50

## 2012-11-22 MED ORDER — EPHEDRINE SULFATE 50 MG/ML IJ SOLN
INTRAMUSCULAR | Status: DC | PRN
  Administered 2012-11-22: 5 mg via INTRAVENOUS

## 2012-11-22 MED ORDER — PHENOL 1.4 % MT LIQD: 1.0000 | OROMUCOSAL | Status: DC | PRN

## 2012-11-22 MED ORDER — ONDANSETRON HCL 4 MG/2ML IJ SOLN: 4.0000 mg | INTRAMUSCULAR | Status: DC | PRN

## 2012-11-22 MED ORDER — METOPROLOL SUCCINATE ER 50 MG PO TB24
50.0000 mg | ORAL_TABLET | Freq: Every day | ORAL | Status: DC
  Administered 2012-11-23: 50 mg via ORAL
  Filled 2012-11-22 (×2): qty 1

## 2012-11-22 MED ORDER — ASPIRIN 81 MG PO TABS: 81.0000 mg | ORAL_TABLET | Freq: Every day | ORAL | Status: DC

## 2012-11-22 MED ORDER — ONDANSETRON HCL 4 MG/2ML IJ SOLN
INTRAMUSCULAR | Status: DC | PRN
  Administered 2012-11-22: 4 mg via INTRAVENOUS

## 2012-11-22 MED ORDER — PHENYLEPHRINE HCL 10 MG/ML IJ SOLN
INTRAMUSCULAR | Status: DC | PRN
  Administered 2012-11-22: 80 ug via INTRAVENOUS
  Administered 2012-11-22: 40 ug via INTRAVENOUS

## 2012-11-22 MED ORDER — HYDROMORPHONE HCL PF 1 MG/ML IJ SOLN
0.2500 mg | INTRAMUSCULAR | Status: DC | PRN
  Administered 2012-11-22 (×4): 0.5 mg via INTRAVENOUS

## 2012-11-22 MED ORDER — ASPIRIN EC 81 MG PO TBEC
81.0000 mg | DELAYED_RELEASE_TABLET | Freq: Every day | ORAL | Status: DC
  Filled 2012-11-22: qty 1

## 2012-11-22 MED ORDER — NORTRIPTYLINE HCL 10 MG PO CAPS
10.0000 mg | ORAL_CAPSULE | Freq: Every day | ORAL | Status: DC
  Administered 2012-11-22: 10 mg via ORAL
  Filled 2012-11-22 (×2): qty 1

## 2012-11-22 MED ORDER — BUPIVACAINE HCL (PF) 0.25 % IJ SOLN
INTRAMUSCULAR | Status: DC | PRN
  Administered 2012-11-22: 5 mL

## 2012-11-22 MED ORDER — FERROUS SULFATE 325 (65 FE) MG PO TABS
325.0000 mg | ORAL_TABLET | Freq: Every day | ORAL | Status: DC
  Administered 2012-11-23: 325 mg via ORAL
  Filled 2012-11-22 (×2): qty 1

## 2012-11-22 MED ORDER — PHENYLEPHRINE HCL 10 MG/ML IJ SOLN
10.0000 mg | INTRAVENOUS | Status: DC | PRN
  Administered 2012-11-22: 50 ug/min via INTRAVENOUS

## 2012-11-22 MED ORDER — OXYCODONE HCL 5 MG PO TABS
5.0000 mg | ORAL_TABLET | Freq: Once | ORAL | Status: AC | PRN
  Administered 2012-11-22: 5 mg via ORAL

## 2012-11-22 MED ORDER — PIOGLITAZONE HCL 15 MG PO TABS
15.0000 mg | ORAL_TABLET | Freq: Every day | ORAL | Status: DC
  Filled 2012-11-22: qty 1

## 2012-11-22 MED ORDER — CELECOXIB 200 MG PO CAPS
200.0000 mg | ORAL_CAPSULE | Freq: Two times a day (BID) | ORAL | Status: DC
  Administered 2012-11-22 – 2012-11-23 (×2): 200 mg via ORAL
  Filled 2012-11-22 (×4): qty 1

## 2012-11-22 MED ORDER — BACITRACIN 50000 UNITS IM SOLR
INTRAMUSCULAR | Status: AC
  Filled 2012-11-22: qty 1

## 2012-11-22 MED ORDER — MULTI-VITAMIN/MINERALS PO TABS: 1.0000 | ORAL_TABLET | Freq: Every day | ORAL | Status: DC

## 2012-11-22 MED ORDER — OXYCODONE HCL 5 MG PO TABS
ORAL_TABLET | ORAL | Status: AC
  Filled 2012-11-22: qty 1

## 2012-11-22 MED ORDER — ACETAMINOPHEN 650 MG RE SUPP: 650.0000 mg | RECTAL | Status: DC | PRN

## 2012-11-22 MED ORDER — METHOCARBAMOL 500 MG PO TABS
500.0000 mg | ORAL_TABLET | Freq: Four times a day (QID) | ORAL | Status: DC | PRN
  Administered 2012-11-22: 500 mg via ORAL
  Filled 2012-11-22: qty 1

## 2012-11-22 MED ORDER — SODIUM CHLORIDE 0.9 % IJ SOLN
3.0000 mL | Freq: Two times a day (BID) | INTRAMUSCULAR | Status: DC
  Administered 2012-11-22 (×2): 3 mL via INTRAVENOUS

## 2012-11-22 MED ORDER — SODIUM CHLORIDE 0.9 % IV SOLN
INTRAVENOUS | Status: AC
  Filled 2012-11-22: qty 500

## 2012-11-22 MED ORDER — FENTANYL CITRATE 0.05 MG/ML IJ SOLN
INTRAMUSCULAR | Status: DC | PRN
  Administered 2012-11-22: 100 ug via INTRAVENOUS
  Administered 2012-11-22: 150 ug via INTRAVENOUS
  Administered 2012-11-22: 50 ug via INTRAVENOUS

## 2012-11-22 MED ORDER — ARTIFICIAL TEARS OP OINT
TOPICAL_OINTMENT | OPHTHALMIC | Status: DC | PRN
  Administered 2012-11-22: 1 via OPHTHALMIC

## 2012-11-22 MED ORDER — OXYCODONE-ACETAMINOPHEN 5-325 MG PO TABS
1.0000 | ORAL_TABLET | ORAL | Status: DC | PRN
  Administered 2012-11-22: 2 via ORAL
  Filled 2012-11-22: qty 2

## 2012-11-22 MED ORDER — PANTOPRAZOLE SODIUM 40 MG PO TBEC: 40.0000 mg | DELAYED_RELEASE_TABLET | Freq: Every day | ORAL | Status: DC

## 2012-11-22 MED ORDER — FENOFIBRATE 54 MG PO TABS
54.0000 mg | ORAL_TABLET | Freq: Every day | ORAL | Status: DC
  Administered 2012-11-22: 54 mg via ORAL
  Filled 2012-11-22 (×2): qty 1

## 2012-11-22 MED ORDER — LACTATED RINGERS IV SOLN
INTRAVENOUS | Status: DC | PRN
  Administered 2012-11-22 (×2): via INTRAVENOUS

## 2012-11-22 SURGICAL SUPPLY — 74 items
ADH SKN CLS APL DERMABOND .7 (GAUZE/BANDAGES/DRESSINGS) ×4
APL SKNCLS STERI-STRIP NONHPOA (GAUZE/BANDAGES/DRESSINGS) ×2
BAG DECANTER FOR FLEXI CONT (MISCELLANEOUS) ×5 IMPLANT
BENZOIN TINCTURE PRP APPL 2/3 (GAUZE/BANDAGES/DRESSINGS) ×5 IMPLANT
BLADE SURG ROTATE 9660 (MISCELLANEOUS) IMPLANT
BONE MATRIX OSTEOCEL PLUS 5CC (Bone Implant) ×1 IMPLANT
CLOTH BEACON ORANGE TIMEOUT ST (SAFETY) ×5 IMPLANT
CONT SPEC 4OZ CLIKSEAL STRL BL (MISCELLANEOUS) IMPLANT
COROENT STNADARD 10X22X55 (Orthopedic Implant) ×1 IMPLANT
COVER BACK TABLE 24X17X13 BIG (DRAPES) ×1 IMPLANT
COVER TABLE BACK 60X90 (DRAPES) ×2 IMPLANT
DERMABOND ADVANCED (GAUZE/BANDAGES/DRESSINGS) ×2
DERMABOND ADVANCED .7 DNX12 (GAUZE/BANDAGES/DRESSINGS) ×4 IMPLANT
DRAPE C-ARM 42X72 X-RAY (DRAPES) ×6 IMPLANT
DRAPE C-ARMOR (DRAPES) ×5 IMPLANT
DRAPE LAPAROTOMY 100X72X124 (DRAPES) ×5 IMPLANT
DRAPE POUCH INSTRU U-SHP 10X18 (DRAPES) ×3 IMPLANT
DRAPE SURG 17X23 STRL (DRAPES) ×16 IMPLANT
DRESSING TELFA 8X3 (GAUZE/BANDAGES/DRESSINGS) ×4 IMPLANT
DRSG OPSITE 4X5.5 SM (GAUZE/BANDAGES/DRESSINGS) ×7 IMPLANT
DURAPREP 26ML APPLICATOR (WOUND CARE) ×3 IMPLANT
ELECT BLADE 4.0 EZ CLEAN MEGAD (MISCELLANEOUS)
ELECT REM PT RETURN 9FT ADLT (ELECTROSURGICAL) ×3
ELECTRODE BLDE 4.0 EZ CLN MEGD (MISCELLANEOUS) IMPLANT
ELECTRODE REM PT RTRN 9FT ADLT (ELECTROSURGICAL) ×4 IMPLANT
EVACUATOR 1/8 PVC DRAIN (DRAIN) IMPLANT
GAUZE SPONGE 4X4 16PLY XRAY LF (GAUZE/BANDAGES/DRESSINGS) IMPLANT
GLOVE BIO SURGEON STRL SZ8 (GLOVE) ×6 IMPLANT
GLOVE BIOGEL PI IND STRL 7.0 (GLOVE) IMPLANT
GLOVE BIOGEL PI IND STRL 8.5 (GLOVE) IMPLANT
GLOVE BIOGEL PI INDICATOR 7.0 (GLOVE) ×2
GLOVE BIOGEL PI INDICATOR 8.5 (GLOVE) ×1
GLOVE SS BIOGEL STRL SZ 6.5 (GLOVE) IMPLANT
GLOVE SUPERSENSE BIOGEL SZ 6.5 (GLOVE) ×2
GOWN BRE IMP SLV AUR LG STRL (GOWN DISPOSABLE) ×3 IMPLANT
GOWN BRE IMP SLV AUR XL STRL (GOWN DISPOSABLE) ×2 IMPLANT
GOWN STRL REIN 2XL LVL4 (GOWN DISPOSABLE) ×2 IMPLANT
GUIDEWIRE NITINOL BEVEL TIP (WIRE) ×2 IMPLANT
HEMOSTAT POWDER KIT SURGIFOAM (HEMOSTASIS) IMPLANT
KIT BASIN OR (CUSTOM PROCEDURE TRAY) ×5 IMPLANT
KIT DILATOR XLIF 5 (KITS) IMPLANT
KIT MAXCESS (KITS) ×1 IMPLANT
KIT NDL NVM5 EMG ELECT (KITS) IMPLANT
KIT NEEDLE NVM5 EMG ELECT (KITS) ×2 IMPLANT
KIT NEEDLE NVM5 EMG ELECTRODE (KITS) ×1
KIT ROOM TURNOVER OR (KITS) ×5 IMPLANT
KIT XLIF (KITS) ×1
NDL HYPO 25X1 1.5 SAFETY (NEEDLE) ×2 IMPLANT
NDL I-PASS III (NEEDLE) IMPLANT
NEEDLE HYPO 22GX1.5 SAFETY (NEEDLE) ×3 IMPLANT
NEEDLE HYPO 25X1 1.5 SAFETY (NEEDLE) ×3 IMPLANT
NEEDLE I-PASS III (NEEDLE) ×3 IMPLANT
NS IRRIG 1000ML POUR BTL (IV SOLUTION) ×5 IMPLANT
PACK LAMINECTOMY NEURO (CUSTOM PROCEDURE TRAY) ×5 IMPLANT
PUTTY BONE DBX 5CC MIX (Putty) ×1 IMPLANT
ROD 45MM (Rod) ×1 IMPLANT
SCREW PRECEPT 6.5X50 (Screw) ×2 IMPLANT
SCREW PRECEPT SET (Screw) ×2 IMPLANT
SPONGE GAUZE 4X4 12PLY (GAUZE/BANDAGES/DRESSINGS) ×3 IMPLANT
SPONGE LAP 4X18 X RAY DECT (DISPOSABLE) IMPLANT
SPONGE SURGIFOAM ABS GEL SZ50 (HEMOSTASIS) IMPLANT
STAPLER SKIN PROX WIDE 3.9 (STAPLE) ×1 IMPLANT
STRIP CLOSURE SKIN 1/2X4 (GAUZE/BANDAGES/DRESSINGS) ×3 IMPLANT
SUT VIC AB 0 CT1 18XCR BRD8 (SUTURE) ×6 IMPLANT
SUT VIC AB 0 CT1 8-18 (SUTURE) ×6
SUT VIC AB 2-0 CP2 18 (SUTURE) ×8 IMPLANT
SUT VIC AB 3-0 SH 8-18 (SUTURE) ×8 IMPLANT
SWABSTICK BENZOIN STERILE (MISCELLANEOUS) ×2 IMPLANT
SYR 20ML ECCENTRIC (SYRINGE) ×5 IMPLANT
TAPE CLOTH 3X10 TAN LF (GAUZE/BANDAGES/DRESSINGS) ×8 IMPLANT
TOWEL OR 17X24 6PK STRL BLUE (TOWEL DISPOSABLE) ×6 IMPLANT
TOWEL OR 17X26 10 PK STRL BLUE (TOWEL DISPOSABLE) ×6 IMPLANT
TRAY FOLEY CATH 14FRSI W/METER (CATHETERS) ×3 IMPLANT
WATER STERILE IRR 1000ML POUR (IV SOLUTION) ×5 IMPLANT

## 2012-11-22 NOTE — Plan of Care (Signed)
Problem: Consults Goal: Diagnosis - Spinal Surgery Outcome: Completed/Met Date Met:  11/22/12 Thoraco/Lumbar Spine Fusion

## 2012-11-22 NOTE — Anesthesia Postprocedure Evaluation (Signed)
  Anesthesia Post-op Note  Patient: [name]  Procedure(s) Performed: Procedure(s) with comments: ANTERIOR LATERAL LUMBAR FUSION 1 LEVEL (Left) - Anterior Lateral Lumbar Fusion Lumbar Three-Four LUMBAR PERCUTANEOUS PEDICLE SCREW 1 LEVEL (N/A) - Lumbar Three-Four Percutaneous Pedicle Screw, Lateral approach  Patient Location: PACU  Anesthesia Type:General  Level of Consciousness: awake  Airway and Oxygen Therapy: Patient Spontanous Breathing and Patient connected to nasal cannula oxygen  Post-op Pain: moderate  Post-op Assessment: Post-op Vital signs reviewed, Patient's Cardiovascular Status Stable, Respiratory Function Stable, Patent Airway and No signs of Nausea or vomiting  Post-op Vital Signs: Reviewed and stable  Complications: No apparent anesthesia complications

## 2012-11-22 NOTE — Transfer of Care (Signed)
Immediate Anesthesia Transfer of Care Note  Patient: [name]  Procedure(s) Performed: Procedure(s) with comments: ANTERIOR LATERAL LUMBAR FUSION 1 LEVEL (Left) - Anterior Lateral Lumbar Fusion Lumbar Three-Four LUMBAR PERCUTANEOUS PEDICLE SCREW 1 LEVEL (N/A) - Lumbar Three-Four Percutaneous Pedicle Screw, Lateral approach  Patient Location: PACU  Anesthesia Type:General  Level of Consciousness: sedated  Airway & Oxygen Therapy: Patient Spontanous Breathing and Patient connected to nasal cannula oxygen  Post-op Assessment: Report given to PACU RN and Post -op Vital signs reviewed and stable  Post vital signs: Reviewed and stable  Complications: No apparent anesthesia complications

## 2012-11-22 NOTE — Progress Notes (Signed)
UR COMPLETED  

## 2012-11-22 NOTE — Op Note (Signed)
11/22/2012  10:59 AM  PATIENT:  [name]  77 y.o. male  PRE-OPERATIVE DIAGNOSIS:  Adjacent level stenosis with spondylolisthesis L3-4 with back and leg  POST-OPERATIVE DIAGNOSIS:  Same  PROCEDURE:  1. Anterolateral retroperitoneal interbody fusion L3-4 utilizing a 10 mm standard peek cage packed with morcellized allograft, 2. Nonsegmental fixation with percutaneous pedicle screws  SURGEON:  Marikay Alar, MD  ASSISTANTS: stern  ANESTHESIA:   General  EBL: 20 ml  Total I/O In: 1400 [I.V.:1400] Out: 380 [Urine:360; Blood:20]  BLOOD ADMINISTERED:none  DRAINS: none   SPECIMEN:  No Specimen  INDICATION FOR PROCEDURE: This patient underwent a previous decompressive laminectomy at L3-4, L4 5 L5-S1 followed by a non-instrumented fusion L4-5 L5-S1 in the remote past. He developed progressive back pain with leg pain. CT myelogram showed solid fusion L4 from L5-S1 but spondylolisthesis with recurrent stenosis at L3-4. I recommended an instrumented fusion at that level. Patient understood the risks, benefits, and alternatives and potential outcomes and wished to proceed.  PROCEDURE DETAILS: The patient was brought to the operating room and after induction of adequate generalized endotracheal anesthesia, the patient was positioned in the right lateral decubitus position exposing the left side. The patient was positioned in the typical XLIF fashion and taped into position and trajectories were confirmed with AP lateral fluoroscopy. The skin was cleaned and then prepped with DuraPrep and draped in the usual sterile fashion. 5 cc of local anesthesia was injected. A lateral incision was made over the appropriate disc space and a incision was made posterior lateral to this. Blunt finger dissection was used to enter the retroperitoneal space. I could palpate the psoas musculature as well as the anterior face of the transverse process, and I could feel the fatty tissues of the retroperitoneum. I  swept my finger up to the lateral incision and passed my first dilator down to psoas musculature utilizing my index finger. We then used EMG monitoring to monitor our dilator. We checked our position with fluoroscopy and then placed a K wire into the L3-4 disc space, and then used sequential dilators until our final retractor was in place. We then positioned it utilizing AP lateral fluoroscopy, and used our ball probe to make sure there were no neural structures within the working channel. I then placed the intradiscal shim, and opened my retractor further. The annulus was then incised and the discectomy was done with pituitaries. The annulus was removed from the endplates with a Cobb and the opposite annulus was opened and released. I then used a series of scrapers and shavers to prepare the endplates, taking care not to violate the endplates. I then placed my 16 mm paddle across the disc space to further open opposite annulus and I checked the trajectory with AP and lateral fluoroscopy. I then used sequential trials to determine the correct size cage. The 10 standard trial fit the best, so a corresponding cage was selected and packed with morcellized allograft. Using sliders it was then tapped gently into the disc space utilizing AP fluoroscopy until it was appropriately positioned. I then irrigated with saline solution containing bacitracin and dried any bleeding points. I checked my final construct with AP lateral fluoroscopy, removed my retractor.  AP and lateral fluoroscopy was used to determine landmarks for the percutaneous pedicle screws. Stab incisions were made over the pedicles and a Jamshidi needle was passed down to identify the transverse process and the pedicle screw entry zone. Utilizing AP fluoroscopy, we started the needle at the lateral  border of the pedicle and tapped it into the pedicle into it reached the medial border of the pedicle on the AP image. EMG monitoring was used. We then checked  our lateral fluoroscopy to assure our needle was in good position, and then passed our K wire into the vertebral body. We did this for each pedicle in the same way. We were then able to tap over each K wire, measured the correct length of our screws, and then place our pedicle screws over the K wires until they were in correct position. We then passed our rod through the towers and locked into position with locking caps and antitorque device. We then checked our final construct with AP lateral fluoroscopy. Then irrigated with saline solution containing bacitracin and dried all bleeding points. The wounds were then closed in layers of 0 Vicryl in the fascia, 2-0 Vicryl in the subcutaneous tissues, and 3-0 Vicryl in the subcuticular tissues. The skin was closed with Dermabond.  the patient was awakened from general anesthesia and transferred to the recovery in stable condition. At the end of the procedure all sponge, needle and instrument counts were correct.  PLAN OF CARE: Admit to inpatient   PATIENT DISPOSITION:  PACU - hemodynamically stable.   Delay start of Pharmacological VTE agent (>24hrs) due to surgical blood loss or risk of bleeding:  yes

## 2012-11-22 NOTE — Anesthesia Preprocedure Evaluation (Addendum)
Anesthesia Evaluation  Patient identified by MRN, date of birth, ID band Patient awake    Reviewed: Allergy & Precautions, H&P , NPO status , Patient's Chart, lab work & pertinent test results, reviewed documented beta blocker date and time   Airway Mallampati: II TM Distance: >3 FB Neck ROM: Full    Dental no notable dental hx. (+) Teeth Intact, Dental Advisory Given and Missing,    Pulmonary shortness of breath, sleep apnea ,  breath sounds clear to auscultation  Pulmonary exam normal       Cardiovascular hypertension, On Medications and On Home Beta Blockers + CAD, + Past MI, + Cardiac Stents, + CABG and +CHF Rhythm:Regular Rate:Normal     Neuro/Psych PSYCHIATRIC DISORDERS negative neurological ROS     GI/Hepatic Neg liver ROS, hiatal hernia, GERD-  Medicated and Controlled,  Endo/Other  diabetes, Well Controlled, Type 2, Oral Hypoglycemic Agents  Renal/GU negative Renal ROS  negative genitourinary   Musculoskeletal   Abdominal   Peds  Hematology negative hematology ROS (+)   Anesthesia Other Findings   Reproductive/Obstetrics negative OB ROS                          Anesthesia Physical Anesthesia Plan  ASA: III  Anesthesia Plan: General   Post-op Pain Management:    Induction: Intravenous  Airway Management Planned: Oral ETT  Additional Equipment: Arterial line  Intra-op Plan:   Post-operative Plan: Extubation in OR  Informed Consent: I have reviewed the patients History and Physical, chart, labs and discussed the procedure including the risks, benefits and alternatives for the proposed anesthesia with the patient or authorized representative who has indicated his/her understanding and acceptance.   Dental advisory given  Plan Discussed with: CRNA  Anesthesia Plan Comments:         Anesthesia Quick Evaluation

## 2012-11-22 NOTE — Anesthesia Procedure Notes (Signed)
Procedure Name: Intubation Date/Time: 11/22/2012 8:41 AM Performed by: Orvilla Fus A Pre-anesthesia Checklist: Patient identified, Timeout performed, Emergency Drugs available, Suction available and Patient being monitored Patient Re-evaluated:Patient Re-evaluated prior to inductionOxygen Delivery Method: Circle system utilized Preoxygenation: Pre-oxygenation with 100% oxygen Intubation Type: IV induction Ventilation: Mask ventilation without difficulty and Oral airway inserted - appropriate to patient size Laryngoscope Size: Mac and 4 Grade View: Grade I Tube type: Oral Tube size: 8.0 mm Number of attempts: 1 Airway Equipment and Method: Stylet and LTA kit utilized Placement Confirmation: ETT inserted through vocal cords under direct vision,  breath sounds checked- equal and bilateral and positive ETCO2 Secured at: 22 cm Tube secured with: Tape Dental Injury: Teeth and Oropharynx as per pre-operative assessment

## 2012-11-22 NOTE — H&P (Signed)
Subjective: Patient is a 77 y.o. male admitted for XLIF L3-4. Onset of symptoms was several months ago, gradually worsening since that time.  The pain is rated moderate, and is located at the across the lower back and radiates to legs. The pain is described as aching and occurs intermittently. The symptoms have been progressive. Symptoms are exacerbated by exercise. MRI or CT showed adjacent level stenosis L3-4.   Past Medical History  Diagnosis Date  . ASHD (arteriosclerotic heart disease)   . Diabetes mellitus   . ED (erectile dysfunction)   . Back pain, chronic   . GERD (gastroesophageal reflux disease)   . Hypertension   . Dyslipidemia   . Heart disease   . MI (myocardial infarction)   . CAD (coronary artery disease)   . AAA (abdominal aortic aneurysm) 08/30/12  . Blood transfusion without reported diagnosis     with heart or back surgery  . CHF (congestive heart failure)   . Shortness of breath     at baseline has SOB  . Sleep apnea     pt. states he was told to return for f/u, to be fitted for CPAp, but pt. reports that he didn't follow up  . H/O hiatal hernia   . Arthritis     back, knees    Past Surgical History  Procedure Laterality Date  . Heart attack  1985,1991    X2 [facility])  . Neck surgery    . Colonoscopy    . Coronary angioplasty with stent placement  2005  . Coronary artery bypass graft  2000  . Back surgery  1979    pt. remarks, "I have had about 10 back surgeries"  . Hemorroidectomy    . Joint replacement      R knee replacement   . Knee arthroscopy      L knee 2 times     Prior to Admission medications   Medication Sig Start Date End Date Taking? Authorizing Provider  Choline Fenofibrate 135 MG capsule Take 135 mg by mouth daily.    Yes Historical Provider, MD  digoxin (LANOXIN) 0.125 MG tablet Take 0.125 mg by mouth daily.   Yes Historical Provider, MD  ferrous sulfate 325 (65 FE) MG tablet Take 325 mg by mouth daily with breakfast.    Yes Historical Provider, MD  losartan-hydrochlorothiazide (HYZAAR) 100-12.5 MG per tablet Take 1 tablet by mouth daily.   Yes Historical Provider, MD  metoprolol (TOPROL-XL) 50 MG 24 hr tablet Take 50 mg by mouth daily before breakfast.    Yes Historical Provider, MD  Multiple Vitamins-Minerals (MULTIVITAMIN WITH MINERALS) tablet Take 1 tablet by mouth daily.    Yes Historical Provider, MD  nortriptyline (PAMELOR) 10 MG capsule 10 mg at bedtime.  08/24/12  Yes Historical Provider, MD  omeprazole (PRILOSEC) 20 MG capsule Take 20 mg by mouth daily before breakfast.    Yes Historical Provider, MD  pioglitazone-metformin (ACTOPLUS MET) 15-500 MG per tablet Take 1 tablet by mouth daily with breakfast.  07/26/12  Yes Ronnald Nian, MD  vardenafil (LEVITRA) 20 MG tablet Take 20 mg by mouth daily as needed. For erectile dysfunction   Yes Historical Provider, MD  aspirin 81 MG tablet Take 81 mg by mouth daily.    Historical Provider, MD  clopidogrel (PLAVIX) 75 MG tablet Take 75 mg by mouth daily.     Historical Provider, MD  glucose blood test strip Test 1 time daily. Pt uses [employer] elite test strips 11/09/12  Ronnald Nian, MD   Allergies  Allergen Reactions  . Lisinopril Other (See Comments)    Rxn: unknown  . Statins Other (See Comments)    Muscle ache  . Welchol (Colesevelam Hcl) Itching    History  Substance Use Topics  . Smoking status: Former Smoker    Types: Cigarettes    Quit date: 10/05/1983  . Smokeless tobacco: Never Used  . Alcohol Use: 1.8 oz/week    3 Cans of beer per week    Family History  Problem Relation Age of Onset  . Arthritis Mother   . Diabetes Father   . Heart disease Father   . Stroke Sister   . Hypertension Sister   . Heart disease Sister   . Diabetes Sister   . Breast cancer Sister   . Heart disease Brother   . Ulcers Brother   . Colon cancer Neg Hx      Review of Systems  Positive ROS: neg  All other systems have been reviewed and were otherwise  negative with the exception of those mentioned in the HPI and as above.  Objective: Vital signs in last 24 hours: Temp:  [97.2 F (36.2 C)] 97.2 F (36.2 C) (02/19 0645) Pulse Rate:  [60] 60 (02/19 0645) Resp:  [20] 20 (02/19 0645) BP: (136)/(77) 136/77 mmHg (02/19 0645) SpO2:  [95 %] 95 % (02/19 0645)  General Appearance: Alert, cooperative, no distress, appears stated age Head: Normocephalic, without obvious abnormality, atraumatic Eyes: PERRL, conjunctiva/corneas clear, EOM's intact      Back: Symmetric, no curvature, ROM normal, no CVA tenderness Lungs: Clear to auscultation bilaterally, respirations unlabored Heart: Regular rate and rhythm Abdomen: Soft, non-tender Extremities: Extremities normal, atraumatic, no cyanosis or edema Pulses: 2+ and symmetric all extremities Skin: Skin color, texture, turgor normal, no rashes or lesions  NEUROLOGIC:   Mental status: Alert and oriented x4,  no aphasia, good attention span, fund of knowledge, and memory Motor Exam - grossly normal Sensory Exam - grossly normal Reflexes: 1+ Coordination - grossly normal Gait - grossly normal Balance - grossly normal Cranial Nerves: I: smell Not tested  II: visual acuity  OS: nl    OD: nl  II: visual fields Full to confrontation  II: pupils Equal, round, reactive to light  III,VII: ptosis None  III,IV,VI: extraocular muscles  Full ROM  V: mastication Normal  V: facial light touch sensation  Normal  V,VII: corneal reflex  Present  VII: facial muscle function - upper  Normal  VII: facial muscle function - lower Normal  VIII: hearing Not tested  IX: soft palate elevation  Normal  IX,X: gag reflex Present  XI: trapezius strength  5/5  XI: sternocleidomastoid strength 5/5  XI: neck flexion strength  5/5  XII: tongue strength  Normal    Data Review Lab Results  Component Value Date   WBC 6.4 11/15/2012   HGB 13.2 11/15/2012   HCT 39.1 11/15/2012   MCV 93.3 11/15/2012   PLT 202 11/15/2012    Lab Results  Component Value Date   NA 138 11/15/2012   K 4.1 11/15/2012   CL 101 11/15/2012   CO2 25 11/15/2012   BUN 20 11/15/2012   CREATININE 1.30 11/15/2012   GLUCOSE 196* 11/15/2012   Lab Results  Component Value Date   INR 0.95 11/15/2012    Assessment/Plan: Patient admitted for XLIF L3-4. Patient has failed conservative therapy.  I explained the condition and procedure to the patient and answered any questions.  Patient wishes to proceed with procedure as planned. Understands risks/ benefits and typical outcomes of procedure.   Chany Woolworth S 11/22/2012 8:01 AM

## 2012-11-23 LAB — GLUCOSE, CAPILLARY

## 2012-11-23 MED ORDER — OXYCODONE HCL 5 MG PO TABS
5.0000 mg | ORAL_TABLET | Freq: Four times a day (QID) | ORAL | Status: DC | PRN
Start: 2012-11-23 — End: 2013-04-16

## 2012-11-23 MED ORDER — METHOCARBAMOL 500 MG PO TABS: 500.0000 mg | ORAL_TABLET | Freq: Four times a day (QID) | ORAL | Status: DC | PRN

## 2012-11-23 NOTE — Progress Notes (Signed)
Occupational Therapy Discharge Patient Details Name: [name] MRN: [id number] DOB: 1936/03/17 Today's Date: 11/23/2012 Time: 4098-1191 OT Time Calculation (min): 19 min  Patient discharged from OT services secondary to goals met and no further OT needs identified.  Please see latest therapy progress note for current level of functioning and progress toward goals.    Progress and discharge plan discussed with patient and/or caregiver: Patient/Caregiver agrees with plan  GO     Lucile Shutters 11/23/2012, 9:39 AM

## 2012-11-23 NOTE — Progress Notes (Signed)
PT Cancellation Note  Patient Details Name: [name] MRN: [id number] DOB: 02-Jun-1936   Cancelled Treatment:    Reason Eval/Treat Not Completed: Other (comment) (No PT eval needed due to pt doing well and precautions covered by OT)   [facility] 11/23/2012, 11:08 AM

## 2012-11-23 NOTE — Discharge Summary (Signed)
Physician Discharge Summary  Patient ID: [name] MRN: [id number] DOB/AGE: [birth year] 77 y.o.  Admit date: 11/22/2012 Discharge date: 11/23/2012  Admission Diagnoses: Spondylolisthesis with stenosis L3-4    Discharge Diagnoses: Same   Discharged Condition: good  Hospital Course: The patient was admitted on 11/22/2012 and taken to the operating room where the patient underwent XLIF L3-4 with percutaneous pedicle screws. The patient tolerated the procedure well and was taken to the recovery room and then to the floor in stable condition. The hospital course was routine. There were no complications. The wound remained clean dry and intact. Pt had appropriate back soreness. No complaints of leg pain or new N/T/W. The patient remained afebrile with stable vital signs, and tolerated a regular diet. The patient continued to increase activities, and pain was well controlled with oral pain medications.   Consults: None  Significant Diagnostic Studies:  Results for orders placed during the hospital encounter of 11/22/12  GLUCOSE, CAPILLARY      Result Value Range   Glucose-Capillary 131 (*) 70 - 99 mg/dL   Comment 1 Notify RN    GLUCOSE, CAPILLARY      Result Value Range   Glucose-Capillary 181 (*) 70 - 99 mg/dL  GLUCOSE, CAPILLARY      Result Value Range   Glucose-Capillary 257 (*) 70 - 99 mg/dL  GLUCOSE, CAPILLARY      Result Value Range   Glucose-Capillary 242 (*) 70 - 99 mg/dL   Comment 1 Documented in Chart     Comment 2 Notify RN    GLUCOSE, CAPILLARY      Result Value Range   Glucose-Capillary 134 (*) 70 - 99 mg/dL   Comment 1 Notify RN     Comment 2 Documented in Chart    ABO/RH      Result Value Range   ABO/RH(D) A POS      Chest 2 View  11/15/2012  *RADIOLOGY REPORT*  Clinical Data: Preop  CHEST - 2 VIEW  Comparison: 04/21/2010  Findings: Cardiomediastinal silhouette is stable.  Status post median sternotomy.  No acute infiltrate or pleural effusion.  No pulmonary  edema.  Stable degenerative changes thoracic spine.  IMPRESSION: No active disease.  No significant change.   Original Report Authenticated By: Natasha Mead, M.D.    Dg Lumbar Spine 2-3 Views  11/22/2012  *RADIOLOGY REPORT*  Clinical Data: Back pain  DG C-ARM 61-120 MIN,LUMBAR SPINE - 2-3 VIEW  Comparison: CT myelogram 08/02/2012  Findings: C-arm films document L3-4 XLIF with unilateral pedicle screw construct on the left.  IMPRESSION: Satisfactory position and alignment.   Original Report Authenticated By: Davonna Belling, M.D.    Dg C-arm 61-120 Min  11/22/2012  *RADIOLOGY REPORT*  Clinical Data: Back pain  DG C-ARM 61-120 MIN,LUMBAR SPINE - 2-3 VIEW  Comparison: CT myelogram 08/02/2012  Findings: C-arm films document L3-4 XLIF with unilateral pedicle screw construct on the left.  IMPRESSION: Satisfactory position and alignment.   Original Report Authenticated By: Davonna Belling, M.D.     Antibiotics:  Anti-infectives   Start     Dose/Rate Route Frequency Ordered Stop   11/22/12 1500  ceFAZolin (ANCEF) IVPB 1 g/50 mL premix     1 g 100 mL/hr over 30 Minutes Intravenous Every 8 hours 11/22/12 1406 11/23/12 0022   11/22/12 1015  bacitracin 50,000 Units in sodium chloride irrigation 0.9 % 500 mL irrigation  Status:  Discontinued       As needed 11/22/12 1016 11/22/12 1104   11/22/12  9147  bacitracin 82956 UNITS injection    Comments:  Reece Packer: cabinet override      11/22/12 0808 11/22/12 2014   11/22/12 0600  ceFAZolin (ANCEF) IVPB 2 g/50 mL premix     2 g 100 mL/hr over 30 Minutes Intravenous On call to O.R. 11/21/12 1353 11/22/12 0856      Discharge Exam: Blood pressure 135/71, pulse 73, temperature 98.1 F (36.7 C), temperature source Oral, resp. rate 16, SpO2 100.00%. Neurologic: Grossly normal Wounds clean dry and  Discharge Medications:     Medication List    TAKE these medications       aspirin 81 MG tablet  Take 81 mg by mouth daily.     Choline Fenofibrate 135 MG  capsule  Take 135 mg by mouth daily.     clopidogrel 75 MG tablet  Commonly known as:  PLAVIX  Take 75 mg by mouth daily.     digoxin 0.125 MG tablet  Commonly known as:  LANOXIN  Take 0.125 mg by mouth daily.     ferrous sulfate 325 (65 FE) MG tablet  Take 325 mg by mouth daily with breakfast.     glucose blood test strip  Test 1 time daily. Pt uses ascensia elite test strips     losartan-hydrochlorothiazide 100-12.5 MG per tablet  Commonly known as:  HYZAAR  Take 1 tablet by mouth daily.     methocarbamol 500 MG tablet  Commonly known as:  ROBAXIN  Take 1 tablet (500 mg total) by mouth every 6 (six) hours as needed.     metoprolol succinate 50 MG 24 hr tablet  Commonly known as:  TOPROL-XL  Take 50 mg by mouth daily before breakfast.     multivitamin with minerals tablet  Take 1 tablet by mouth daily.     nortriptyline 10 MG capsule  Commonly known as:  PAMELOR  10 mg at bedtime.     omeprazole 20 MG capsule  Commonly known as:  PRILOSEC  Take 20 mg by mouth daily before breakfast.     oxyCODONE 5 MG immediate release tablet  Commonly known as:  Oxy IR/ROXICODONE  Take 1-2 tablets (5-10 mg total) by mouth every 6 (six) hours as needed for pain.     pioglitazone-metformin 15-500 MG per tablet  Commonly known as:  ACTOPLUS MET  Take 1 tablet by mouth daily with breakfast.     vardenafil 20 MG tablet  Commonly known as:  LEVITRA  Take 20 mg by mouth daily as needed. For erectile dysfunction        Disposition: Home   Final Dx: XLIF L3-4      Discharge Orders   Future Orders Complete By Expires     Call MD for:  difficulty breathing, headache or visual disturbances  As directed     Call MD for:  persistant nausea and vomiting  As directed     Call MD for:  redness, tenderness, or signs of infection (pain, swelling, redness, odor or green/yellow discharge around incision site)  As directed     Call MD for:  severe uncontrolled pain  As directed     Call  MD for:  temperature >100.4  As directed     Diet - low sodium heart healthy  As directed     Discharge instructions  As directed     Comments:      No driving for at least 1 week, no bending or twisting at the waist, no  lifting anything heavier than a gallon of milk, may shower normally    Increase activity slowly  As directed     No wound care  As directed        Follow-up Information   Follow up with Classie Weng S, MD. Schedule an appointment as soon as possible for a visit in 2 weeks.   Contact information:   [address] [place] [address] [phone]        Signed: Tia Alert 11/23/2012, 8:35 AM

## 2012-11-23 NOTE — Evaluation (Signed)
Occupational Therapy Evaluation Patient Details Name: [name] MRN: [id number] DOB: 09/14/1936 Today's Date: 11/23/2012 Time: 1478-2956 OT Time Calculation (min): 19 min  OT Assessment / Plan / Recommendation Clinical Impression  77 yo male s/p ALIF L3-4 that does not require skilled OT acutely. Recommend No follow up.     OT Assessment  Patient does not need any further OT services    Follow Up Recommendations  No OT follow up    Barriers to Discharge      Equipment Recommendations  None recommended by OT    Recommendations for Other Services    Frequency       Precautions / Restrictions Precautions Precautions: Back Precaution Comments: handout provided and adls with precautions demo / reviewed    Pertinent Vitals/Pain     ADL  Eating/Feeding: Independent Where Assessed - Eating/Feeding: Chair Grooming: Wash/dry hands;Wash/dry face;Independent Where Assessed - Grooming: Unsupported standing Upper Body Dressing: Independent Where Assessed - Upper Body Dressing: Unsupported sitting Lower Body Dressing: Minimal assistance Where Assessed - Lower Body Dressing: Unsupported sitting Toilet Transfer: Independent Toilet Transfer Method: Sit to stand Toilet Transfer Equipment: Regular height toilet Tub/Shower Transfer: Modified independent Tub/Shower Transfer Method: Science writer: Walk in shower Equipment Used: Back brace Transfers/Ambulation Related to ADLs: pt ambulates independently ADL Comments: Pt provided education on adls with back precautions. pt is able to recall 3 out 3 precautions. pt needs cues for avoiding twisting to reach objects with Rt dominant hand and bending to dress LB. Pt has reacher at home for LB dressing and wife (A). pt will need wife assistance for shoes. Pt is ready for d/ chome    OT Diagnosis:    OT Problem List:   OT Treatment Interventions:     OT Goals    Visit Information  Last OT Received On:  11/23/12 Assistance Needed: +1    Subjective Data  Subjective: When can I go fishing on a boat?-  Patient Stated Goal: to go to the lake and enjoy the boat   Prior Functioning     Home Living Lives With: Spouse Available Help at Discharge: Family Type of Home: House Bathroom Shower/Tub: Walk-in shower;Door [employer] Toilet: Standard Home Adaptive Equipment: Bedside commode/3-in-1 Prior Function Level of Independence: Independent Able to Take Stairs?: Yes Driving: Yes Vocation: Retired Musician: No difficulties Dominant Hand: Right         Vision/Perception Vision - History Baseline Vision: No visual deficits Patient Visual Report: No change from baseline   Cognition  Cognition Overall Cognitive Status: Appears within functional limits for tasks assessed/performed Arousal/Alertness: Awake/alert Orientation Level: Appears intact for tasks assessed Behavior During Session: [facility] for tasks performed    Extremity/Trunk Assessment Right Upper Extremity Assessment RUE ROM/Strength/Tone: Within functional levels RUE Coordination: WFL - gross/fine motor Left Upper Extremity Assessment LUE ROM/Strength/Tone: Within functional levels LUE Coordination: WFL - gross/fine motor Trunk Assessment Trunk Assessment: Normal     Mobility Bed Mobility Bed Mobility: Not assessed (able to verbalize to therapist) Transfers Transfers: Sit to Stand;Stand to Sit Sit to Stand: 7: Independent;From bed Stand to Sit: To bed;7: Independent     Exercise     Balance     End of Session OT - End of Session Activity Tolerance: Patient tolerated treatment well Patient left: in bed;with call bell/phone within reach Nurse Communication: Mobility status;Precautions  GO     Lucile Shutters 11/23/2012, 9:39 AM Pager: [phone]

## 2012-11-23 NOTE — Progress Notes (Signed)
Pt given D/C instructions with Rx's, verbal understanding given by Pt. Pt D/C'd home via wheelchair @ 1030 per MD order. Rema Fendt, RN

## 2012-11-24 ENCOUNTER — Emergency Department (HOSPITAL_COMMUNITY): Payer: Medicare Other

## 2012-11-24 ENCOUNTER — Encounter (HOSPITAL_COMMUNITY): Payer: Self-pay | Admitting: Neurological Surgery

## 2012-11-24 ENCOUNTER — Ambulatory Visit (INDEPENDENT_AMBULATORY_CARE_PROVIDER_SITE_OTHER): Payer: Medicare Other | Admitting: Family Medicine

## 2012-11-24 ENCOUNTER — Inpatient Hospital Stay (HOSPITAL_COMMUNITY)
Admission: EM | Admit: 2012-11-24 | Discharge: 2012-11-28 | DRG: 194 | Disposition: A | Discharge status: Home / Self Care | Payer: Medicare Other | Attending: Internal Medicine | Admitting: Internal Medicine

## 2012-11-24 VITALS — BP 140/90 | HR 81 | Temp 100.4°F | Resp 24 | Wt 225.0 lb

## 2012-11-24 DIAGNOSIS — J40 Bronchitis, not specified as acute or chronic: Secondary | ICD-10-CM | POA: Diagnosis not present

## 2012-11-24 DIAGNOSIS — Z7982 Long term (current) use of aspirin: Secondary | ICD-10-CM | POA: Diagnosis not present

## 2012-11-24 DIAGNOSIS — Z9861 Coronary angioplasty status: Secondary | ICD-10-CM | POA: Diagnosis not present

## 2012-11-24 DIAGNOSIS — Z981 Arthrodesis status: Secondary | ICD-10-CM | POA: Diagnosis not present

## 2012-11-24 DIAGNOSIS — I509 Heart failure, unspecified: Secondary | ICD-10-CM | POA: Diagnosis present

## 2012-11-24 DIAGNOSIS — Z951 Presence of aortocoronary bypass graft: Secondary | ICD-10-CM

## 2012-11-24 DIAGNOSIS — J189 Pneumonia, unspecified organism: Secondary | ICD-10-CM | POA: Diagnosis not present

## 2012-11-24 DIAGNOSIS — Z87891 Personal history of nicotine dependence: Secondary | ICD-10-CM | POA: Diagnosis not present

## 2012-11-24 DIAGNOSIS — Z96659 Presence of unspecified artificial knee joint: Secondary | ICD-10-CM

## 2012-11-24 DIAGNOSIS — I2510 Atherosclerotic heart disease of native coronary artery without angina pectoris: Secondary | ICD-10-CM | POA: Diagnosis present

## 2012-11-24 DIAGNOSIS — Z823 Family history of stroke: Secondary | ICD-10-CM

## 2012-11-24 DIAGNOSIS — R0682 Tachypnea, not elsewhere classified: Secondary | ICD-10-CM | POA: Diagnosis not present

## 2012-11-24 DIAGNOSIS — E785 Hyperlipidemia, unspecified: Secondary | ICD-10-CM | POA: Diagnosis present

## 2012-11-24 DIAGNOSIS — I252 Old myocardial infarction: Secondary | ICD-10-CM

## 2012-11-24 DIAGNOSIS — Z7902 Long term (current) use of antithrombotics/antiplatelets: Secondary | ICD-10-CM

## 2012-11-24 DIAGNOSIS — Z833 Family history of diabetes mellitus: Secondary | ICD-10-CM

## 2012-11-24 DIAGNOSIS — E119 Type 2 diabetes mellitus without complications: Secondary | ICD-10-CM | POA: Diagnosis present

## 2012-11-24 DIAGNOSIS — M79609 Pain in unspecified limb: Secondary | ICD-10-CM

## 2012-11-24 DIAGNOSIS — R791 Abnormal coagulation profile: Secondary | ICD-10-CM | POA: Diagnosis not present

## 2012-11-24 DIAGNOSIS — R509 Fever, unspecified: Secondary | ICD-10-CM | POA: Diagnosis present

## 2012-11-24 DIAGNOSIS — I714 Abdominal aortic aneurysm, without rupture, unspecified: Secondary | ICD-10-CM | POA: Diagnosis present

## 2012-11-24 DIAGNOSIS — IMO0002 Reserved for concepts with insufficient information to code with codable children: Secondary | ICD-10-CM | POA: Diagnosis not present

## 2012-11-24 DIAGNOSIS — Z8261 Family history of arthritis: Secondary | ICD-10-CM | POA: Diagnosis not present

## 2012-11-24 DIAGNOSIS — Z803 Family history of malignant neoplasm of breast: Secondary | ICD-10-CM | POA: Diagnosis not present

## 2012-11-24 DIAGNOSIS — E1169 Type 2 diabetes mellitus with other specified complication: Secondary | ICD-10-CM | POA: Diagnosis not present

## 2012-11-24 DIAGNOSIS — E871 Hypo-osmolality and hyponatremia: Secondary | ICD-10-CM | POA: Diagnosis present

## 2012-11-24 DIAGNOSIS — R059 Cough, unspecified: Secondary | ICD-10-CM | POA: Diagnosis not present

## 2012-11-24 DIAGNOSIS — I5032 Chronic diastolic (congestive) heart failure: Secondary | ICD-10-CM | POA: Diagnosis not present

## 2012-11-24 DIAGNOSIS — R0602 Shortness of breath: Secondary | ICD-10-CM | POA: Insufficient documentation

## 2012-11-24 DIAGNOSIS — I10 Essential (primary) hypertension: Secondary | ICD-10-CM | POA: Diagnosis present

## 2012-11-24 DIAGNOSIS — Z8249 Family history of ischemic heart disease and other diseases of the circulatory system: Secondary | ICD-10-CM

## 2012-11-24 DIAGNOSIS — Z888 Allergy status to other drugs, medicaments and biological substances status: Secondary | ICD-10-CM | POA: Diagnosis not present

## 2012-11-24 DIAGNOSIS — Z79899 Other long term (current) drug therapy: Secondary | ICD-10-CM | POA: Diagnosis not present

## 2012-11-24 DIAGNOSIS — K219 Gastro-esophageal reflux disease without esophagitis: Secondary | ICD-10-CM | POA: Diagnosis present

## 2012-11-24 DIAGNOSIS — E11A Type 2 diabetes mellitus without complications in remission: Secondary | ICD-10-CM | POA: Diagnosis present

## 2012-11-24 DIAGNOSIS — M7989 Other specified soft tissue disorders: Secondary | ICD-10-CM | POA: Diagnosis not present

## 2012-11-24 LAB — URINALYSIS, ROUTINE W REFLEX MICROSCOPIC
Bilirubin Urine: NEGATIVE
Ketones, ur: NEGATIVE mg/dL
Nitrite: NEGATIVE
Urobilinogen, UA: 1 mg/dL (ref 0.0–1.0)

## 2012-11-24 LAB — BASIC METABOLIC PANEL
Calcium: 9.3 mg/dL (ref 8.4–10.5)
Chloride: 96 mEq/L (ref 96–112)
Creatinine, Ser: 1.37 mg/dL — ABNORMAL HIGH (ref 0.50–1.35)
GFR calc Af Amer: 57 mL/min — ABNORMAL LOW (ref >90)
Sodium: 132 mEq/L — ABNORMAL LOW (ref 135–145)

## 2012-11-24 LAB — DIFFERENTIAL
Basophils Relative: 0 % (ref 0–1)
Eosinophils Absolute: 0 10*3/uL (ref 0.0–0.7)
Eosinophils Relative: 0 % (ref 0–5)
Monocytes Absolute: 0.7 10*3/uL (ref 0.1–1.0)
Monocytes Relative: 10 % (ref 3–12)

## 2012-11-24 LAB — GLUCOSE, CAPILLARY: Glucose-Capillary: 212 mg/dL — ABNORMAL HIGH (ref 70–99)

## 2012-11-24 LAB — CBC
MCH: 31.9 pg (ref 26.0–34.0)
MCV: 93.6 fL (ref 78.0–100.0)
Platelets: 178 10*3/uL (ref 150–400)
RDW: 14 % (ref 11.5–15.5)
WBC: 7.3 10*3/uL (ref 4.0–10.5)

## 2012-11-24 LAB — POCT I-STAT TROPONIN I: Troponin i, poc: 0.01 ng/mL (ref 0.00–0.08)

## 2012-11-24 LAB — HEPATIC FUNCTION PANEL
Bilirubin, Direct: 0.1 mg/dL (ref 0.0–0.3)
Total Bilirubin: 0.5 mg/dL (ref 0.3–1.2)

## 2012-11-24 LAB — D-DIMER, QUANTITATIVE: D-Dimer, Quant: 1.12 ug/mL-FEU — ABNORMAL HIGH (ref 0.00–0.48)

## 2012-11-24 MED ORDER — INSULIN ASPART 100 UNIT/ML ~~LOC~~ SOLN
0.0000 [IU] | Freq: Every day | SUBCUTANEOUS | Status: DC
  Administered 2012-11-26: 2 [IU] via SUBCUTANEOUS
  Administered 2012-11-27: 4 [IU] via SUBCUTANEOUS

## 2012-11-24 MED ORDER — SODIUM CHLORIDE 0.9 % IV SOLN
INTRAVENOUS | Status: DC
  Administered 2012-11-24: 20:00:00 via INTRAVENOUS

## 2012-11-24 MED ORDER — ONDANSETRON HCL 4 MG/2ML IJ SOLN: 4.0000 mg | Freq: Four times a day (QID) | INTRAMUSCULAR | Status: DC | PRN

## 2012-11-24 MED ORDER — METHOCARBAMOL 500 MG PO TABS
500.0000 mg | ORAL_TABLET | Freq: Four times a day (QID) | ORAL | Status: DC | PRN
  Filled 2012-11-24: qty 1

## 2012-11-24 MED ORDER — ALBUTEROL SULFATE (5 MG/ML) 0.5% IN NEBU: 2.5000 mg | INHALATION_SOLUTION | RESPIRATORY_TRACT | Status: DC | PRN

## 2012-11-24 MED ORDER — OXYCODONE HCL 5 MG PO TABS
5.0000 mg | ORAL_TABLET | Freq: Once | ORAL | Status: AC
  Administered 2012-11-24: 5 mg via ORAL
  Filled 2012-11-24: qty 1

## 2012-11-24 MED ORDER — NORTRIPTYLINE HCL 10 MG PO CAPS
10.0000 mg | ORAL_CAPSULE | Freq: Every evening | ORAL | Status: DC | PRN
  Filled 2012-11-24: qty 1

## 2012-11-24 MED ORDER — METHOCARBAMOL 500 MG PO TABS
500.0000 mg | ORAL_TABLET | Freq: Once | ORAL | Status: AC
  Administered 2012-11-24: 500 mg via ORAL
  Filled 2012-11-24: qty 1

## 2012-11-24 MED ORDER — FERROUS SULFATE 325 (65 FE) MG PO TABS
325.0000 mg | ORAL_TABLET | Freq: Every day | ORAL | Status: DC
  Administered 2012-11-25 – 2012-11-28 (×4): 325 mg via ORAL
  Filled 2012-11-24 (×6): qty 1

## 2012-11-24 MED ORDER — VANCOMYCIN HCL 10 G IV SOLR
2000.0000 mg | INTRAVENOUS | Status: AC
  Administered 2012-11-24: 2000 mg via INTRAVENOUS
  Filled 2012-11-24: qty 2000

## 2012-11-24 MED ORDER — LEVOFLOXACIN IN D5W 500 MG/100ML IV SOLN: 500.0000 mg | INTRAVENOUS | Status: DC

## 2012-11-24 MED ORDER — ALBUTEROL SULFATE (5 MG/ML) 0.5% IN NEBU
2.5000 mg | INHALATION_SOLUTION | Freq: Four times a day (QID) | RESPIRATORY_TRACT | Status: DC
  Administered 2012-11-24 – 2012-11-28 (×14): 2.5 mg via RESPIRATORY_TRACT
  Filled 2012-11-24 (×15): qty 0.5

## 2012-11-24 MED ORDER — ACETAMINOPHEN 325 MG PO TABS: 650.0000 mg | ORAL_TABLET | Freq: Four times a day (QID) | ORAL | Status: DC | PRN

## 2012-11-24 MED ORDER — VANCOMYCIN HCL 10 G IV SOLR
1500.0000 mg | INTRAVENOUS | Status: DC
  Administered 2012-11-25: 1500 mg via INTRAVENOUS
  Filled 2012-11-24 (×2): qty 1500

## 2012-11-24 MED ORDER — SODIUM CHLORIDE 0.9 % IV SOLN
INTRAVENOUS | Status: DC
  Administered 2012-11-24: via INTRAVENOUS

## 2012-11-24 MED ORDER — ACETAMINOPHEN 650 MG RE SUPP: 650.0000 mg | Freq: Four times a day (QID) | RECTAL | Status: DC | PRN

## 2012-11-24 MED ORDER — INSULIN ASPART 100 UNIT/ML ~~LOC~~ SOLN
0.0000 [IU] | Freq: Three times a day (TID) | SUBCUTANEOUS | Status: DC
  Administered 2012-11-25 (×2): 2 [IU] via SUBCUTANEOUS
  Administered 2012-11-25: 1 [IU] via SUBCUTANEOUS
  Administered 2012-11-26: 2 [IU] via SUBCUTANEOUS
  Administered 2012-11-26: 3 [IU] via SUBCUTANEOUS
  Administered 2012-11-26: 2 [IU] via SUBCUTANEOUS
  Administered 2012-11-27: 5 [IU] via SUBCUTANEOUS
  Administered 2012-11-27: 1 [IU] via SUBCUTANEOUS
  Administered 2012-11-27: 2 [IU] via SUBCUTANEOUS
  Administered 2012-11-28: 7 [IU] via SUBCUTANEOUS

## 2012-11-24 MED ORDER — DEXTROSE 5 % IV SOLN
1.0000 g | Freq: Three times a day (TID) | INTRAVENOUS | Status: DC
  Administered 2012-11-24 – 2012-11-26 (×5): 1 g via INTRAVENOUS
  Filled 2012-11-24 (×7): qty 1

## 2012-11-24 MED ORDER — METOPROLOL SUCCINATE ER 50 MG PO TB24
50.0000 mg | ORAL_TABLET | Freq: Every day | ORAL | Status: DC
  Administered 2012-11-25 – 2012-11-28 (×4): 50 mg via ORAL
  Filled 2012-11-24 (×6): qty 1

## 2012-11-24 MED ORDER — ALUM & MAG HYDROXIDE-SIMETH 200-200-20 MG/5ML PO SUSP: 30.0000 mL | Freq: Four times a day (QID) | ORAL | Status: DC | PRN

## 2012-11-24 MED ORDER — LEVOFLOXACIN IN D5W 750 MG/150ML IV SOLN
750.0000 mg | INTRAVENOUS | Status: DC
  Administered 2012-11-25 (×2): 750 mg via INTRAVENOUS
  Filled 2012-11-24 (×3): qty 150

## 2012-11-24 MED ORDER — DEXTROSE 5 % IV SOLN: 1.0000 g | Freq: Two times a day (BID) | INTRAVENOUS | Status: DC

## 2012-11-24 MED ORDER — FENOFIBRATE 160 MG PO TABS
160.0000 mg | ORAL_TABLET | Freq: Every day | ORAL | Status: DC
  Administered 2012-11-25 – 2012-11-28 (×4): 160 mg via ORAL
  Filled 2012-11-24 (×4): qty 1

## 2012-11-24 MED ORDER — HYDROMORPHONE HCL PF 1 MG/ML IJ SOLN
0.5000 mg | INTRAMUSCULAR | Status: DC | PRN
  Administered 2012-11-25: 1 mg via INTRAVENOUS
  Administered 2012-11-27: 0.5 mg via INTRAVENOUS
  Filled 2012-11-24 (×2): qty 1

## 2012-11-24 MED ORDER — ADULT MULTIVITAMIN W/MINERALS CH
1.0000 | ORAL_TABLET | Freq: Every day | ORAL | Status: DC
  Administered 2012-11-25 – 2012-11-28 (×4): 1 via ORAL
  Filled 2012-11-24 (×4): qty 1

## 2012-11-24 MED ORDER — OXYCODONE HCL 5 MG PO TABS
5.0000 mg | ORAL_TABLET | ORAL | Status: DC | PRN
  Administered 2012-11-25 – 2012-11-28 (×7): 5 mg via ORAL
  Filled 2012-11-24 (×8): qty 1

## 2012-11-24 MED ORDER — ZOLPIDEM TARTRATE 5 MG PO TABS: 5.0000 mg | ORAL_TABLET | Freq: Every evening | ORAL | Status: DC | PRN

## 2012-11-24 MED ORDER — ASPIRIN EC 81 MG PO TBEC
81.0000 mg | DELAYED_RELEASE_TABLET | Freq: Every day | ORAL | Status: DC
  Administered 2012-11-25 – 2012-11-28 (×4): 81 mg via ORAL
  Filled 2012-11-24 (×4): qty 1

## 2012-11-24 MED ORDER — ONDANSETRON HCL 4 MG PO TABS
4.0000 mg | ORAL_TABLET | Freq: Four times a day (QID) | ORAL | Status: DC | PRN
  Filled 2012-11-24: qty 0.5

## 2012-11-24 MED ORDER — PANTOPRAZOLE SODIUM 40 MG PO TBEC
40.0000 mg | DELAYED_RELEASE_TABLET | Freq: Every day | ORAL | Status: DC
  Administered 2012-11-25 – 2012-11-28 (×4): 40 mg via ORAL
  Filled 2012-11-24 (×2): qty 1

## 2012-11-24 MED ORDER — DIGOXIN 125 MCG PO TABS
0.1250 mg | ORAL_TABLET | Freq: Every day | ORAL | Status: DC
  Administered 2012-11-25 – 2012-11-28 (×4): 0.125 mg via ORAL
  Filled 2012-11-24 (×4): qty 1

## 2012-11-24 MED ORDER — CLOPIDOGREL BISULFATE 75 MG PO TABS
75.0000 mg | ORAL_TABLET | Freq: Every day | ORAL | Status: DC
  Administered 2012-11-25 – 2012-11-28 (×4): 75 mg via ORAL
  Filled 2012-11-24 (×5): qty 1

## 2012-11-24 MED ORDER — SODIUM CHLORIDE 0.9 % IV SOLN
INTRAVENOUS | Status: DC
  Administered 2012-11-24: 22:00:00 via INTRAVENOUS

## 2012-11-24 MED ORDER — ENOXAPARIN SODIUM 40 MG/0.4ML ~~LOC~~ SOLN
40.0000 mg | Freq: Every day | SUBCUTANEOUS | Status: DC
  Administered 2012-11-25 – 2012-11-28 (×4): 40 mg via SUBCUTANEOUS
  Filled 2012-11-24 (×4): qty 0.4

## 2012-11-24 NOTE — ED Notes (Signed)
Pt reports he had lumbar surgery yesterday and had a plate placed in his spine. States since the surgery he has not felt good. Pt reports fever at home and swelling in bilateral legs. Denies any pain in his legs. Sent for r/o PNA or PE/DVT from PCP office.

## 2012-11-24 NOTE — ED Notes (Signed)
Admitting MD at bedside.

## 2012-11-24 NOTE — ED Provider Notes (Signed)
History     CSN: [id number]  Arrival date & time 11/24/12  1612   First MD Initiated Contact with Patient 11/24/12 1823      Chief Complaint  Patient presents with  . Leg Swelling  . Shortness of Breath    (Consider location/radiation/quality/duration/timing/severity/associated sxs/prior treatment) HPI Comments: Patient had back surgery on 11/23/2011. He went home yesterday. He has had cough, low-grade fever, swelling in both legs, and calf pain bilaterally since then. He was seen by his primary care physician, Sharlot Gowda, M.D., today in his office. Dr. Susann Givens found a temperature 100.4. He advised him to go to the ED for evaluation for pneumonia or referred deep venous thrombosis.  He has a prior history of coronary artery disease with CABG, as well as hypertension, diabetes, prior CHF, and abdominal aortic aneurysm.    Patient is a 77 y.o. male presenting with shortness of breath. The history is provided by the patient and medical records. No language interpreter was used.  Shortness of Breath Onset quality:  Gradual Duration:  2 days Timing:  Constant Progression:  Worsening Chronicity:  New Context comment:  Came on following back surgery. Relieved by:  Nothing Worsened by:  Nothing tried Ineffective treatments:  None tried Associated symptoms: cough, fever and sputum production   Risk factors comment:  Recent postop.   Past Medical History  Diagnosis Date  . ASHD (arteriosclerotic heart disease)   . Diabetes mellitus   . ED (erectile dysfunction)   . Back pain, chronic   . GERD (gastroesophageal reflux disease)   . Hypertension   . Dyslipidemia   . Heart disease   . MI (myocardial infarction)   . CAD (coronary artery disease)   . AAA (abdominal aortic aneurysm) 08/30/12  . Blood transfusion without reported diagnosis     with heart or back surgery  . CHF (congestive heart failure)   . Shortness of breath     at baseline has SOB  . Sleep apnea     pt. states  he was told to return for f/u, to be fitted for CPAp, but pt. reports that he didn't follow up  . H/O hiatal hernia   . Arthritis     back, knees    Past Surgical History  Procedure Laterality Date  . Heart attack  1985,1991    X2 [facility])  . Neck surgery    . Colonoscopy    . Coronary angioplasty with stent placement  2005  . Coronary artery bypass graft  2000  . Back surgery  1979    pt. remarks, "I have had about 10 back surgeries"  . Hemorroidectomy    . Joint replacement      R knee replacement   . Knee arthroscopy      L knee 2 times   . Anterior lat lumbar fusion Left 11/22/2012    Procedure: ANTERIOR LATERAL LUMBAR FUSION 1 LEVEL;  Surgeon: Tia Alert, MD;  Location: [facility];  Service: Neurosurgery;  Laterality: Left;  Anterior Lateral Lumbar Fusion Lumbar Three-Four  . Lumbar percutaneous pedicle screw 1 level N/A 11/22/2012    Procedure: LUMBAR PERCUTANEOUS PEDICLE SCREW 1 LEVEL;  Surgeon: Tia Alert, MD;  Location: [facility];  Service: Neurosurgery;  Laterality: N/A;  Lumbar Three-Four Percutaneous Pedicle Screw, Lateral approach    Family History  Problem Relation Age of Onset  . Arthritis Mother   . Diabetes Father   . Heart disease Father   . Stroke Sister   .  Hypertension Sister   . Heart disease Sister   . Diabetes Sister   . Breast cancer Sister   . Heart disease Brother   . Ulcers Brother   . Colon cancer Neg Hx     History  Substance Use Topics  . Smoking status: Former Smoker    Types: Cigarettes    Quit date: 10/05/1983  . Smokeless tobacco: Never Used  . Alcohol Use: 1.8 oz/week    3 Cans of beer per week      Review of Systems  Constitutional: Positive for fever and chills.  HENT: Negative.        Pt is somewhat deaf.  Eyes: Negative.   Respiratory: Positive for cough, sputum production and shortness of breath.   Cardiovascular: Positive for leg swelling.  Gastrointestinal: Negative.   Genitourinary: Negative.    Musculoskeletal: Negative.   Skin: Negative.   Neurological: Negative.   Psychiatric/Behavioral: Negative.     Allergies  Lisinopril; Statins; and Welchol  Home Medications   Current Outpatient Rx  Name  Route  Sig  Dispense  Refill  . aspirin 81 MG tablet   Oral   Take 81 mg by mouth daily.         . Choline Fenofibrate 135 MG capsule   Oral   Take 135 mg by mouth daily.          . clopidogrel (PLAVIX) 75 MG tablet   Oral   Take 75 mg by mouth daily.          . digoxin (LANOXIN) 0.125 MG tablet   Oral   Take 0.125 mg by mouth daily.         . ferrous sulfate 325 (65 FE) MG tablet   Oral   Take 325 mg by mouth daily with breakfast.         . losartan-hydrochlorothiazide (HYZAAR) 100-12.5 MG per tablet   Oral   Take 1 tablet by mouth daily.         . methocarbamol (ROBAXIN) 500 MG tablet   Oral   Take 500 mg by mouth every 6 (six) hours as needed. For pain         . metoprolol (TOPROL-XL) 50 MG 24 hr tablet   Oral   Take 50 mg by mouth daily before breakfast.          . Multiple Vitamins-Minerals (MULTIVITAMIN WITH MINERALS) tablet   Oral   Take 1 tablet by mouth daily.          . nortriptyline (PAMELOR) 10 MG capsule      10 mg daily as needed. Helps with legs per wife         . omeprazole (PRILOSEC) 20 MG capsule   Oral   Take 20 mg by mouth daily before breakfast.          . oxyCODONE (OXY IR/ROXICODONE) 5 MG immediate release tablet   Oral   Take 1-2 tablets (5-10 mg total) by mouth every 6 (six) hours as needed for pain.   80 tablet   0   . pioglitazone-metformin (ACTOPLUS MET) 15-500 MG per tablet   Oral   Take 1 tablet by mouth daily with breakfast.          . vardenafil (LEVITRA) 20 MG tablet   Oral   Take 20 mg by mouth daily as needed. For erectile dysfunction           BP 141/66  Pulse 83  Temp(Src) 99.3 F (  37.4 C) (Oral)  Resp 22  SpO2 95%  Physical Exam  Nursing note and vitals  reviewed. Constitutional: He is oriented to person, place, and time. He appears well-developed and well-nourished.  Appears ill.  Has a loose cough.  Eyes: Conjunctivae and EOM are normal. Pupils are equal, round, and reactive to light.  Neck: Normal range of motion. Neck supple.  Cardiovascular: Normal rate, regular rhythm and normal heart sounds.   Pulmonary/Chest: Effort normal and breath sounds normal.  Abdominal: Soft. Bowel sounds are normal.  Musculoskeletal:  1-2+ bilateral calf swelling.  Neurological: He is alert and oriented to person, place, and time.  Somewhat deaf, otherwise cranial nerves intact, sensory and motor intact.  Skin: Skin is warm and dry.  Psychiatric: He has a normal mood and affect. His behavior is normal.    ED Course  Procedures (including critical care time)  Labs Reviewed  CBC - Abnormal; Notable for the following:    RBC 3.92 (*)    Hemoglobin 12.5 (*)    HCT 36.7 (*)    All other components within normal limits  BASIC METABOLIC PANEL - Abnormal; Notable for the following:    Sodium 132 (*)    Glucose, Bld 144 (*)    Creatinine, Ser 1.37 (*)    GFR calc non Af Amer 49 (*)    GFR calc Af Amer 57 (*)    All other components within normal limits  D-DIMER, QUANTITATIVE - Abnormal; Notable for the following:    D-Dimer, Quant 1.12 (*)    All other components within normal limits  CBC WITH DIFFERENTIAL  COMPREHENSIVE METABOLIC PANEL  URINALYSIS, ROUTINE W REFLEX MICROSCOPIC  PRO B NATRIURETIC PEPTIDE   Dg Chest 2 View  11/24/2012  *RADIOLOGY REPORT*  Clinical Data: Leg swelling, shortness of breath.  CHEST - 2 VIEW  Comparison: 11/15/2012  Findings: Mild peribronchial thickening.  Prior CABG.  Heart is normal size.  No confluent opacities or effusions.  No acute bony abnormality.  Degenerative changes in the thoracic spine.  IMPRESSION: Bronchitic changes.   Original Report Authenticated By: Charlett Nose, M.D.    Course in ED:  Pt seen -->  physical exam performed.  Lab workup ordered.  Vascular duplex studies requested on both legs.  Old charts reviewed.  7:25 PM  Date: 11/24/2012  Rate: 85  Rhythm: normal sinus rhythm  QRS Axis: normal  Intervals: normal  ST/T Wave abnormalities: normal  Conduction Disutrbances:  Incomplete right bundle branch block.   Narrative Interpretation: Abnormal EKG.  Old EKG Reviewed: unchanged  8:52 PM Results for orders placed during the hospital encounter of 11/24/12  CBC      Result Value Range   WBC 7.3  4.0 - 10.5 K/uL   RBC 3.92 (*) 4.22 - 5.81 MIL/uL   Hemoglobin 12.5 (*) 13.0 - 17.0 g/dL   HCT 40.9 (*) 81.1 - 91.4 %   MCV 93.6  78.0 - 100.0 fL   MCH 31.9  26.0 - 34.0 pg   MCHC 34.1  30.0 - 36.0 g/dL   RDW 78.2  95.6 - 21.3 %   Platelets 178  150 - 400 K/uL  BASIC METABOLIC PANEL      Result Value Range   Sodium 132 (*) 135 - 145 mEq/L   Potassium 4.6  3.5 - 5.1 mEq/L   Chloride 96  96 - 112 mEq/L   CO2 27  19 - 32 mEq/L   Glucose, Bld 144 (*) 70 - 99 mg/dL  BUN 21  6 - 23 mg/dL   Creatinine, Ser 8.11 (*) 0.50 - 1.35 mg/dL   Calcium 9.3  8.4 - 91.4 mg/dL   GFR calc non Af Amer 49 (*) >90 mL/min   GFR calc Af Amer 57 (*) >90 mL/min  D-DIMER, QUANTITATIVE      Result Value Range   D-Dimer, Quant 1.12 (*) 0.00 - 0.48 ug/mL-FEU  URINALYSIS, ROUTINE W REFLEX MICROSCOPIC      Result Value Range   Color, Urine YELLOW  YELLOW   APPearance CLEAR  CLEAR   Specific Gravity, Urine 1.010  1.005 - 1.030   pH 7.0  5.0 - 8.0   Glucose, UA NEGATIVE  NEGATIVE mg/dL   Hgb urine dipstick NEGATIVE  NEGATIVE   Bilirubin Urine NEGATIVE  NEGATIVE   Ketones, ur NEGATIVE  NEGATIVE mg/dL   Protein, ur NEGATIVE  NEGATIVE mg/dL   Urobilinogen, UA 1.0  0.0 - 1.0 mg/dL   Nitrite NEGATIVE  NEGATIVE   Leukocytes, UA NEGATIVE  NEGATIVE  PRO B NATRIURETIC PEPTIDE      Result Value Range   Pro B Natriuretic peptide (BNP) 1127.0 (*) 0 - 125 pg/mL  DIFFERENTIAL      Result Value Range    Neutrophils Relative 80 (*) 43 - 77 %   Neutro Abs 5.8  1.7 - 7.7 K/uL   Lymphocytes Relative 10 (*) 12 - 46 %   Lymphs Abs 0.7  0.7 - 4.0 K/uL   Monocytes Relative 10  3 - 12 %   Monocytes Absolute 0.7  0.1 - 1.0 K/uL   Eosinophils Relative 0  0 - 5 %   Eosinophils Absolute 0.0  0.0 - 0.7 K/uL   Basophils Relative 0  0 - 1 %   Basophils Absolute 0.0  0.0 - 0.1 K/uL  HEPATIC FUNCTION PANEL      Result Value Range   Total Protein 7.2  6.0 - 8.3 g/dL   Albumin 3.5  3.5 - 5.2 g/dL   AST 32  0 - 37 U/L   ALT 23  0 - 53 U/L   Alkaline Phosphatase 29 (*) 39 - 117 U/L   Total Bilirubin 0.5  0.3 - 1.2 mg/dL   Bilirubin, Direct 0.1  0.0 - 0.3 mg/dL   Indirect Bilirubin 0.4  0.3 - 0.9 mg/dL  POCT I-STAT TROPONIN I      Result Value Range   Troponin i, poc 0.01  0.00 - 0.08 ng/mL   Comment 3            Dg Chest 2 View  11/24/2012  *RADIOLOGY REPORT*  Clinical Data: Leg swelling, shortness of breath.  CHEST - 2 VIEW  Comparison: 11/15/2012  Findings: Mild peribronchial thickening.  Prior CABG.  Heart is normal size.  No confluent opacities or effusions.  No acute bony abnormality.  Degenerative changes in the thoracic spine.  IMPRESSION: Bronchitic changes.   Original Report Authenticated By: Charlett Nose, M.D.      Lab tests showed WBC 7,300 with 80% neutrophils.  UA negative.  Chest x-ray showed bronchitic changes, no pneumonia.  BNP was high at 1127.  TNI negative.  D-dime mildly elevated at 1.12.  Venous dopplers negative for DVT.  Diff Dx includes HCAP, CHF, and PE (doubt).  Call to [facility] to admit pt.  Case discussed with Della Goo, M.D., who advised getting him CT angio of chest, Rx for HCAP, and admit to a telemetry bed.  1. HCAP (healthcare-associated pneumonia)  2. SOB (shortness of breath)   3. Fever            Carleene Cooper III, MD 11/25/12 [phone]

## 2012-11-24 NOTE — ED Notes (Signed)
MD Davidson at bedside.

## 2012-11-24 NOTE — Progress Notes (Signed)
ANTIBIOTIC CONSULT NOTE - INITIAL  Pharmacy Consult for vancomycin Indication: rule out pneumonia  Allergies  Allergen Reactions  . Lisinopril Other (See Comments)    Rxn: unknown  . Statins Other (See Comments)    Muscle ache  . Welchol (Colesevelam Hcl) Itching    Patient Measurements: Height: 5' 8.9" (175 cm) Weight: 225 lb 1.4 oz (102.1 kg) IBW/kg (Calculated) : 70.47  Vital Signs: Temp: 99.5 F (37.5 C) (02/21 1854) Temp src: Oral (02/21 1854) BP: 149/67 mmHg (02/21 2100) Pulse Rate: 79 (02/21 2100) Intake/Output from previous day:   Intake/Output from this shift: Total I/O In: 1000 [I.V.:1000] Out: -   Labs:  Recent Labs  11/24/12 1648  WBC 7.3  HGB 12.5*  PLT 178  CREATININE 1.37*   Estimated Creatinine Clearance: 55.6 ml/min (by C-G formula based on Cr of 1.37). No results found for this basename: VANCOTROUGH, VANCOPEAK, VANCORANDOM, GENTTROUGH, GENTPEAK, GENTRANDOM, TOBRATROUGH, TOBRAPEAK, TOBRARND, AMIKACINPEAK, AMIKACINTROU, AMIKACIN,  in the last 72 hours   Microbiology: No results found for this or any previous visit (from the past 720 hour(s)).  Medical History: Past Medical History  Diagnosis Date  . ASHD (arteriosclerotic heart disease)   . Diabetes mellitus   . ED (erectile dysfunction)   . Back pain, chronic   . GERD (gastroesophageal reflux disease)   . Hypertension   . Dyslipidemia   . Heart disease   . MI (myocardial infarction)   . CAD (coronary artery disease)   . AAA (abdominal aortic aneurysm) 08/30/12  . Blood transfusion without reported diagnosis     with heart or back surgery  . CHF (congestive heart failure)   . Shortness of breath     at baseline has SOB  . Sleep apnea     pt. states he was told to return for f/u, to be fitted for CPAp, but pt. reports that he didn't follow up  . H/O hiatal hernia   . Arthritis     back, knees  . Diabetes mellitus     Medications:  Anti-infectives   Start     Dose/Rate Route  Frequency Ordered Stop   11/24/12 2215  vancomycin (VANCOCIN) 2,000 mg in sodium chloride 0.9 % 500 mL IVPB     2,000 mg 250 mL/hr over 120 Minutes Intravenous To Emergency Dept 11/24/12 2202 11/25/12 2215   11/24/12 2200  ceFEPIme (MAXIPIME) 1 g in dextrose 5 % 50 mL IVPB     1 g 100 mL/hr over 30 Minutes Intravenous 3 times per day 11/24/12 2150 12/02/12 2159   11/24/12 2200  levofloxacin (LEVAQUIN) IVPB 750 mg     750 mg 100 mL/hr over 90 Minutes Intravenous Every 24 hours 11/24/12 2150 11/27/12 2159     Assessment: 76 yom presented to the ED with leg swelling and SOB. To start empiric vancomycin per pharmacy for pneumonia. Also ordered cefepime + levaquin with appropriate dosing. Current Tmax is 100.4 and WBC is WNL. Scr is mildly elevated at 1.37. Pt is obese.   Goal of Therapy:  Vancomycin trough level 15-20 mcg/ml  Plan:  1. Vancomycin 2gm IV x 1 now then 1500mg  IV Q24H 2. Continue cefepime + levaquin per as ordered 3. F/u renal fxn, C&S, clinical status and trough at [facility], [facility] 11/24/2012,10:03 PM

## 2012-11-24 NOTE — ED Notes (Signed)
Ultrasound at bedside

## 2012-11-24 NOTE — ED Notes (Signed)
Pt requesting pain rx for lower back. MD made aware.

## 2012-11-24 NOTE — H&P (Addendum)
[facility] History and Physical  [name] QMV:[id number] DOB: [birth year] DOA: 11/24/2012  Referring physician: EDP PCP: Carollee Herter, MD  Specialists:   Chief Complaint: Fever SOB, and Cough  HPI: [name] is a 77 y.o. male who was discharged from the hospital  Yesterday after a lumbar fusion on 11/22/2012, who presents to the ED with complaints of  fever chills, SOB and increased Coughing.  He reports having a productive cough of yellow mucus and a fever to 100.0 at home.  He also reports having increased swelling from his ankles into the calves of both legs.  He saw his PCP today and was advised to go to the hospital for further evaluation.    He was evaluated in the ED and found to have an elevated D-dimer, Venous Doppler Studies were performed of Both Lower Extremities and were found to be negative for DVTs.  Chest X-ray revealed Bronchitic Changes, and a BNP was found to be 1127, and a CTA of the chest was ordered.   He was placed on IV antibiotic therapy to cover HCAP and referred for admission.      Review of Systems: The patient denies anorexia, weight loss, vision loss, decreased hearing, hoarseness, chest pain, syncope, balance deficits, hemoptysis, abdominal pain, nausea, vomiting, diarrhea, hematemesis, melena, hematochezia, severe indigestion/heartburn, hematuria, incontinence, dysuria, suspicious skin lesions, transient blindness, difficulty walking, depression, unusual weight change, abnormal bleeding, enlarged lymph nodes, angioedema, and breast masses.    Past Medical History  Diagnosis Date  . ASHD (arteriosclerotic heart disease)   . Diabetes mellitus   . ED (erectile dysfunction)   . Back pain, chronic   . GERD (gastroesophageal reflux disease)   . Hypertension   . Dyslipidemia   . Heart disease   . MI (myocardial infarction)   . CAD (coronary artery disease)   . AAA (abdominal aortic aneurysm) 08/30/12  . Blood transfusion without reported  diagnosis     with heart or back surgery  . CHF (congestive heart failure)   . Shortness of breath     at baseline has SOB  . Sleep apnea     pt. states he was told to return for f/u, to be fitted for CPAp, but pt. reports that he didn't follow up  . H/O hiatal hernia   . Arthritis     back, knees  . Diabetes mellitus    Past Surgical History  Procedure Laterality Date  . Heart attack  1985,1991    X2 [facility])  . Neck surgery    . Colonoscopy    . Coronary angioplasty with stent placement  2005  . Coronary artery bypass graft  2000  . Back surgery  1979    pt. remarks, "I have had about 10 back surgeries"  . Hemorroidectomy    . Joint replacement      R knee replacement   . Knee arthroscopy      L knee 2 times   . Anterior lat lumbar fusion Left 11/22/2012    Procedure: ANTERIOR LATERAL LUMBAR FUSION 1 LEVEL;  Surgeon: Tia Alert, MD;  Location: [facility];  Service: Neurosurgery;  Laterality: Left;  Anterior Lateral Lumbar Fusion Lumbar Three-Four  . Lumbar percutaneous pedicle screw 1 level N/A 11/22/2012    Procedure: LUMBAR PERCUTANEOUS PEDICLE SCREW 1 LEVEL;  Surgeon: Tia Alert, MD;  Location: [facility];  Service: Neurosurgery;  Laterality: N/A;  Lumbar Three-Four Percutaneous Pedicle Screw, Lateral approach  Medications:  HOME MEDS: Prior to Admission medications   Medication Sig Start Date End Date Taking? Authorizing Provider  aspirin 81 MG tablet Take 81 mg by mouth daily.   Yes Historical Provider, MD  Choline Fenofibrate 135 MG capsule Take 135 mg by mouth daily.    Yes Historical Provider, MD  clopidogrel (PLAVIX) 75 MG tablet Take 75 mg by mouth daily.    Yes Historical Provider, MD  digoxin (LANOXIN) 0.125 MG tablet Take 0.125 mg by mouth daily.   Yes Historical Provider, MD  ferrous sulfate 325 (65 FE) MG tablet Take 325 mg by mouth daily with breakfast.   Yes Historical Provider, MD  losartan-hydrochlorothiazide (HYZAAR) 100-12.5 MG  per tablet Take 1 tablet by mouth daily.   Yes Historical Provider, MD  methocarbamol (ROBAXIN) 500 MG tablet Take 500 mg by mouth every 6 (six) hours as needed. For pain 11/23/12  Yes Tia Alert, MD  metoprolol (TOPROL-XL) 50 MG 24 hr tablet Take 50 mg by mouth daily before breakfast.    Yes Historical Provider, MD  Multiple Vitamins-Minerals (MULTIVITAMIN WITH MINERALS) tablet Take 1 tablet by mouth daily.    Yes Historical Provider, MD  nortriptyline (PAMELOR) 10 MG capsule 10 mg daily as needed. Helps with legs per wife 08/24/12  Yes Historical Provider, MD  omeprazole (PRILOSEC) 20 MG capsule Take 20 mg by mouth daily before breakfast.    Yes Historical Provider, MD  oxyCODONE (OXY IR/ROXICODONE) 5 MG immediate release tablet Take 1-2 tablets (5-10 mg total) by mouth every 6 (six) hours as needed for pain. 11/23/12  Yes Tia Alert, MD  pioglitazone-metformin (ACTOPLUS MET) 15-500 MG per tablet Take 1 tablet by mouth daily with breakfast.  07/26/12  Yes Ronnald Nian, MD  vardenafil (LEVITRA) 20 MG tablet Take 20 mg by mouth daily as needed. For erectile dysfunction   Yes Historical Provider, MD    Allergies:  Allergies  Allergen Reactions  . Lisinopril Other (See Comments)    Rxn: unknown  . Statins Other (See Comments)    Muscle ache  . Welchol (Colesevelam Hcl) Itching    Social History: Married.     reports that he quit smoking about 29 years ago. His smoking use included Cigarettes. He smoked 0.00 packs per day. He has never used smokeless tobacco. He reports that he drinks about 1.8 ounces of alcohol per week. He does not use illicit drugs.  Family History: Family History  Problem Relation Age of Onset  . Arthritis Mother   . Diabetes Father   . Heart disease Father   . Stroke Sister   . Hypertension Sister   . Heart disease Sister   . Diabetes Sister   . Breast cancer Sister   . Heart disease Brother   . Ulcers Brother   . Colon cancer Neg Hx     Physical  Exam:  GEN:  Pleasant 77 year old well nourished and well developed Caucasian Male examined  and in no acute distress; cooperative with exam Filed Vitals:   11/24/12 1900 11/24/12 1930 11/24/12 2000 11/24/12 2100  BP: 146/63 148/61 145/66 149/67  Pulse: 82 82 79 79  Temp:      TempSrc:      Resp: 26 29 25 27   SpO2: 94% 92% 92% 95%   Blood pressure 149/67, pulse 79, temperature 99.5 F (37.5 C), temperature source Oral, resp. rate 27, SpO2 95.00%. PSYCH: He is alert and oriented x4; does not appear anxious does not appear  depressed; affect is normal HEENT: Normocephalic and Atraumatic, Mucous membranes pink; PERRLA; EOM intact; Fundi:  Benign;  No scleral icterus, Nares: Patent, Oropharynx: Clear, Fair Dentition, Neck:  FROM, no cervical lymphadenopathy nor thyromegaly or carotid bruit; no JVD; Breasts:: Not examined CHEST WALL: No tenderness CHEST: Normal respiration, clear to auscultation bilaterally HEART: Regular rate and rhythm; no murmurs rubs or gallops BACK: No kyphosis or scoliosis; no CVA tenderness ABDOMEN: Positive Bowel Sounds, Obese, soft non-tender; no masses, no organomegaly.    Rectal Exam: Not done EXTREMITIES: No bone or joint deformity; age-appropriate arthropathy of the hands and knees; no cyanosis, clubbing,  Trace EDEMA BLEs, No ulcerations. Genitalia: not examined PULSES: 2+ and symmetric SKIN: Normal hydration no rash or ulceration CNS: Cranial nerves 2-12 grossly intact no focal neurologic deficit    Labs on Admission:  Basic Metabolic Panel:  Recent Labs Lab 11/24/12 1648  NA 132*  K 4.6  CL 96  CO2 27  GLUCOSE 144*  BUN 21  CREATININE 1.37*  CALCIUM 9.3   Liver Function Tests:  Recent Labs Lab 11/24/12 1648  AST 32  ALT 23  ALKPHOS 29*  BILITOT 0.5  PROT 7.2  ALBUMIN 3.5   No results found for this basename: LIPASE, AMYLASE,  in the last 168 hours No results found for this basename: AMMONIA,  in the last 168 hours CBC:  Recent  Labs Lab 11/24/12 1648  WBC 7.3  NEUTROABS 5.8  HGB 12.5*  HCT 36.7*  MCV 93.6  PLT 178   Cardiac Enzymes: No results found for this basename: CKTOTAL, CKMB, CKMBINDEX, TROPONINI,  in the last 168 hours  BNP (last 3 results)  Recent Labs  11/24/12 1648  PROBNP 1127.0*   CBG:  Recent Labs Lab 11/22/12 0651 11/22/12 1151 11/22/12 1649 11/22/12 2155 11/23/12 0828  GLUCAP 131* 181* 257* 242* 134*    Radiological Exams on Admission: Dg Chest 2 View  11/24/2012  *RADIOLOGY REPORT*  Clinical Data: Leg swelling, shortness of breath.  CHEST - 2 VIEW  Comparison: 11/15/2012  Findings: Mild peribronchial thickening.  Prior CABG.  Heart is normal size.  No confluent opacities or effusions.  No acute bony abnormality.  Degenerative changes in the thoracic spine.  IMPRESSION: Bronchitic changes.   Original Report Authenticated By: Charlett Nose, M.D.     EKG:   Normal Sinus Rhythm with RBBB.     Assessment/Plan Principal Problem:   HCAP (healthcare-associated pneumonia) Active Problems:   Fever   SOB (shortness of breath)   Chronic diastolic CHF (congestive heart failure)   Diabetes mellitus   Hypertension associated with diabetes   1.    HCAP ( Early versus Acute Bronchitis)- Placed on IV antibiotic therapy to cover HCAP with  IV Vancomycin, Zosyn, Nebs and O2 PRN.     2.    Fever due to #1, Tylenol PRN.    3.   Chronic Diastolic CHF- on digoxin, metoprolol, and losartan/hctz,  Monitor for evidence of Fluid overload/decompensation.     4.   Diabetes Mellitis-   Hold his Actoplus for now due to CTA with contrast,  Cover with SSI PRN.     5.   Hypertension-  Continue Metoprolol but hold  Losartan/Hctz for now due to CTA with Contrast.     6.   Other-  Reconcile Home Medications and DVT prophylaxis ordered.       Code Status:  FULL CODE Family Communication:  Wife at Bedside Disposition Plan:   Return to Home On  Discharge  Time spent:  60 Minutes  Ron Parker [facility] Pager [phone]  If 7PM-7AM, please contact night-coverage [email or web address] Password [facility] 11/24/2012, 10:01 PM

## 2012-11-24 NOTE — Progress Notes (Signed)
  Subjective:    Patient ID: [name], male    DOB: 08/12/1936, 77 y.o.   MRN: [id number]  HPI He had lumbar surgery and left the hospital on the 20th. He complains of cough congestion that started on the 19th.he did not inform anybody at the hospital he was having difficulty with cough and congestion.he continues to have difficulty with cough, congestion, slight shortness of breath malaise and fever. He has not coughed up any blood.He is here for followup.   Review of Systems     Objective:   Physical Exam Alert and  tachypneic and toxic appearing. Lungs show scattered rhonchi especially on the right. The wound on the left does appear to be healing. The right calf is 1/2 inch larger than the left but nontender       Assessment & Plan:  Tachypnea  Fever, unspecified  S/P lumbar fusion with fever, tachypnea, rhonchi and right calf swollen, I will refer him to the hospital for further evaluation

## 2012-11-24 NOTE — ED Notes (Signed)
Talked with Hannah-PA about orders for d-dimer.

## 2012-11-24 NOTE — Progress Notes (Signed)
VASCULAR LAB PRELIMINARY  PRELIMINARY  PRELIMINARY  PRELIMINARY  Bilateral lower extremity venous duplex completed.    Preliminary report:  Bilateral:  No evidence of DVT, superficial thrombosis, or Baker's Cyst.   April Colter, RVS 11/24/2012, 7:42 PM

## 2012-11-25 ENCOUNTER — Encounter (HOSPITAL_COMMUNITY): Payer: Self-pay | Admitting: Radiology

## 2012-11-25 ENCOUNTER — Inpatient Hospital Stay (HOSPITAL_COMMUNITY): Payer: Medicare Other

## 2012-11-25 DIAGNOSIS — I5032 Chronic diastolic (congestive) heart failure: Secondary | ICD-10-CM

## 2012-11-25 DIAGNOSIS — I509 Heart failure, unspecified: Secondary | ICD-10-CM

## 2012-11-25 DIAGNOSIS — E119 Type 2 diabetes mellitus without complications: Secondary | ICD-10-CM | POA: Diagnosis not present

## 2012-11-25 LAB — CBC
MCHC: 34.6 g/dL (ref 30.0–36.0)
Platelets: 151 10*3/uL (ref 150–400)
RDW: 14.1 % (ref 11.5–15.5)

## 2012-11-25 LAB — BASIC METABOLIC PANEL
BUN: 19 mg/dL (ref 6–23)
Creatinine, Ser: 1.31 mg/dL (ref 0.50–1.35)
GFR calc Af Amer: 60 mL/min — ABNORMAL LOW (ref >90)
GFR calc non Af Amer: 52 mL/min — ABNORMAL LOW (ref >90)
Potassium: 4 mEq/L (ref 3.5–5.1)

## 2012-11-25 LAB — INFLUENZA PANEL BY PCR (TYPE A & B): H1N1 flu by pcr: NOT DETECTED

## 2012-11-25 LAB — GLUCOSE, CAPILLARY
Glucose-Capillary: 165 mg/dL — ABNORMAL HIGH (ref 70–99)
Glucose-Capillary: 164 mg/dL — ABNORMAL HIGH (ref 70–99)

## 2012-11-25 LAB — HEMOGLOBIN A1C: Hgb A1c MFr Bld: 7.8 % — ABNORMAL HIGH (ref <5.7)

## 2012-11-25 MED ORDER — HYDRALAZINE HCL 20 MG/ML IJ SOLN
5.0000 mg | Freq: Four times a day (QID) | INTRAMUSCULAR | Status: DC | PRN
  Filled 2012-11-25: qty 0.25

## 2012-11-25 MED ORDER — IOHEXOL 350 MG/ML SOLN
100.0000 mL | Freq: Once | INTRAVENOUS | Status: AC | PRN
  Administered 2012-11-25: 100 mL via INTRAVENOUS

## 2012-11-25 NOTE — Progress Notes (Signed)
Nutrition Brief Note  Patient identified on the Malnutrition Screening Tool (MST) Report for recent weight loss without trying (patient unsure). Patient's weight has been stable per readings below:  Wt Readings from Last 10 Encounters:  11/25/12 221 lb 1.9 oz (100.3 kg)  11/24/12 225 lb (102.059 kg)  11/15/12 221 lb 14.4 oz (100.653 kg)  09/21/12 222 lb (100.699 kg)  08/16/12 222 lb (100.699 kg)  08/02/12 220 lb (99.791 kg)  07/26/12 220 lb (99.791 kg)  07/12/11 222 lb (100.699 kg)  10/09/10 210 lb (95.255 kg)  03/04/11 213 lb (96.616 kg)    Body mass index is 32.64 kg/(m^2). Patient meets criteria for Obesity Class I based on current BMI.   Current diet order is Carbohydrate Modified Medium Calorie, patient is consuming approximately 100% of meals at this time.  Labs and medications reviewed.   No nutrition interventions warranted at this time.  Please consult RD as needed.  Maureen Chatters, RD, LDN Pager #: [phone] After-Hours Pager #: [phone]

## 2012-11-25 NOTE — Progress Notes (Signed)
[facility] PROGRESS NOTE Interim History: 77 y.o. male who was discharged from the hospital Yesterday after a lumbar fusion on 11/22/2012, who presents to the ED with complaints of fever chills, SOB and increased Coughing. He reports having a productive cough of yellow mucus and a fever to 100.0 at home. He also reports having increased swelling from his ankles into the calves of both legs. He saw his PCP today and was advised to go to the hospital for further evaluation. He was evaluated in the ED and found to have an elevated D-dimer, Venous Doppler Studies were performed of Both Lower Extremities and were found to be negative for DVTs. Chest X-ray revealed Bronchitic Changes, and a BNP was found to be 1127, and a CTA of the chest was ordered. He was placed on IV antibiotic therapy to cover HCAP and referred for admission.     Assessment/Plan:  *HCAP/Fever/ SOB : - On Vanc, Levaquin and cefepime 2.21.2014. - Mild temperature overnight, no leukocytosis. - recently d/c from the hospital for spinal fusion. - check Blood cultures. - PT consult.  Chronic diastolic CHF (congestive heart failure) - Echo on 2.20.2013: Ejection fraction was in the range of 55% to 60%.  Left ventricular diastolic function parameters were normal. - Metoprolol.   Diabetes mellitus - good control continue current treatment. - HbgA1c pending   Hypertension associated with diabetes - currently elevated. Hydralazine PRN. Continue metoprolol - cont to hold ACE, robaxin and HCTZ. - Has Ct angio of chest which was negative for PE. monitor renal function.   Code Status: full Family Communication: wife  Disposition Plan: home 3-4 days   Consultants:  none  Procedures:  none  Antibiotics:  Vancomycin, cefepime and Levaquin started on 11/25/2012  HPI/Subjective: Complaning of SOB and cough  Objective: Filed Vitals:   11/25/12 0115 11/25/12 0610 11/25/12 0741 11/25/12 1021  BP:  149/67  153/72   Pulse:  75  68  Temp:  99.1 F (37.3 C)  98.1 F (36.7 C)  TempSrc:  Oral  Oral  Resp:  18    Height:      Weight:  100.3 kg (221 lb 1.9 oz)    SpO2: 96% 95% 100% 98%    Intake/Output Summary (Last 24 hours) at 11/25/12 1122 Last data filed at 11/25/12 0900  Gross per 24 hour  Intake   1360 ml  Output   1875 ml  Net   -515 ml   Filed Weights   11/24/12 2100 11/24/12 2245 11/25/12 0610  Weight: 102.1 kg (225 lb 1.4 oz) 101.8 kg (224 lb 6.9 oz) 100.3 kg (221 lb 1.9 oz)    Exam:  General: Alert, awake, oriented x3, in no acute distress.  HEENT: No bruits, no goiter. -JVD Heart: Regular rate and rhythm, without murmurs, rubs, gallops.  Lungs: Good air movement, crackles and rhonchi bilaterally..  Abdomen: Soft, nontender, nondistended, positive bowel sounds.  Neuro: Grossly intact, nonfocal. Skin: Erythematous no drainage or tender to palpation at the incision site in the lumbar area.    Data Reviewed: Basic Metabolic Panel:  Recent Labs Lab 11/24/12 1648 11/25/12 0435  NA 132* 134*  K 4.6 4.0  CL 96 100  CO2 27 26  GLUCOSE 144* 154*  BUN 21 19  CREATININE 1.37* 1.31  CALCIUM 9.3 8.7   Liver Function Tests:  Recent Labs Lab 11/24/12 1648  AST 32  ALT 23  ALKPHOS 29*  BILITOT 0.5  PROT 7.2  ALBUMIN 3.5   No results  found for this basename: LIPASE, AMYLASE,  in the last 168 hours No results found for this basename: AMMONIA,  in the last 168 hours CBC:  Recent Labs Lab 11/24/12 1648 11/25/12 0435  WBC 7.3 5.0  NEUTROABS 5.8  --   HGB 12.5* 11.0*  HCT 36.7* 31.8*  MCV 93.6 92.4  PLT 178 151   Cardiac Enzymes: No results found for this basename: CKTOTAL, CKMB, CKMBINDEX, TROPONINI,  in the last 168 hours BNP (last 3 results)  Recent Labs  11/24/12 1648  PROBNP 1127.0*   CBG:  Recent Labs Lab 11/22/12 2155 11/23/12 0828 11/24/12 2257 11/25/12 0637 11/25/12 1102  GLUCAP 242* 134* 212* 152* 188*    No results found for this or  any previous visit (from the past 240 hour(s)).   Studies: Dg Chest 2 View  11/24/2012  *RADIOLOGY REPORT*  Clinical Data: Leg swelling, shortness of breath.  CHEST - 2 VIEW  Comparison: 11/15/2012  Findings: Mild peribronchial thickening.  Prior CABG.  Heart is normal size.  No confluent opacities or effusions.  No acute bony abnormality.  Degenerative changes in the thoracic spine.  IMPRESSION: Bronchitic changes.   Original Report Authenticated By: Charlett Nose, M.D.    Ct Angio Chest W/cm &/or Wo Cm  11/25/2012  *RADIOLOGY REPORT*  Clinical Data: Shortness of breath, leg swelling, and elevated D- dimer 2 days postop back surgery.  Cough.  CT ANGIOGRAPHY CHEST  Technique:  Multidetector CT imaging of the chest using the standard protocol during bolus administration of intravenous contrast. Multiplanar reconstructed images including MIPs were obtained and reviewed to evaluate the vascular anatomy.  Contrast: OMNIPAQUE IOHEXOL 350 MG/ML SOLN  Comparison: None.  Findings: The study is somewhat technically limited due to the contrast bolus.  There is moderately good visualization of the central and proximal segmental pulmonary arteries.  No specific filling defects are demonstrated in the visualized vessels. However mid and distal portions of segmental branches as well as peripheral branches are not well visualized and smaller pulmonary emboli are not excluded.  Mild cardiac enlargement.  Calcification in the mitral valve annulus.  Coronary artery calcifications.  Calcification in the thoracic aorta without aneurysm.  Postoperative changes in the mediastinum.  No significant mediastinal fluid collections.  The esophagus is mostly decompressed.  No significant mediastinal or axillary lymphadenopathy.  Moderate sized esophageal hiatal hernia. No pleural effusions.  Visualization of the lungs is limited due to respiratory motion artifact but there is no gross focal consolidation or airspace disease noted.   Central airway thickening is suggested consistent with chronic bronchitis.  No evidence of pneumothorax.  Degenerative changes in the thoracic spine.  No destructive bone lesions appreciated.  Postoperative changes in the cervical spine.  IMPRESSION: Technically limited study due to poor contrast bolus but no large central pulmonary emboli are visualized.  Central airways thickening suggesting bronchitis.  Coronary artery disease. Moderate sized esophageal hiatal hernia.   Original Report Authenticated By: Burman Nieves, M.D.     Scheduled Meds: . albuterol  2.5 mg Nebulization Q6H  . aspirin EC  81 mg Oral Daily  . ceFEPime (MAXIPIME) IV  1 g Intravenous Q8H  . clopidogrel  75 mg Oral Daily  . digoxin  0.125 mg Oral Daily  . enoxaparin (LOVENOX) injection  40 mg Subcutaneous Daily  . fenofibrate  160 mg Oral Daily  . ferrous sulfate  325 mg Oral Q breakfast  . insulin aspart  0-5 Units Subcutaneous QHS  . insulin aspart  0-9  Units Subcutaneous TID WC  . levofloxacin (LEVAQUIN) IV  750 mg Intravenous Q24H  . metoprolol succinate  50 mg Oral QAC breakfast  . multivitamin with minerals  1 tablet Oral Daily  . pantoprazole  40 mg Oral Daily  . vancomycin  1,500 mg Intravenous Q24H   Continuous Infusions: . sodium chloride 50 mL/hr at 11/24/12 2340     FELIZ Rosine Beat  [facility] Pager [phone].  If 8PM-8AM, please contact night-coverage at [email or web address], password [facility] 11/25/2012, 11:22 AM  LOS: 1 day

## 2012-11-26 LAB — BASIC METABOLIC PANEL
CO2: 26 mEq/L (ref 19–32)
Calcium: 9.2 mg/dL (ref 8.4–10.5)
Chloride: 101 mEq/L (ref 96–112)
Glucose, Bld: 173 mg/dL — ABNORMAL HIGH (ref 70–99)
Potassium: 4.2 mEq/L (ref 3.5–5.1)
Sodium: 135 mEq/L (ref 135–145)

## 2012-11-26 LAB — GLUCOSE, CAPILLARY
Glucose-Capillary: 156 mg/dL — ABNORMAL HIGH (ref 70–99)
Glucose-Capillary: 177 mg/dL — ABNORMAL HIGH (ref 70–99)

## 2012-11-26 LAB — LEGIONELLA ANTIGEN, URINE: Legionella Antigen, Urine: NEGATIVE

## 2012-11-26 MED ORDER — LOSARTAN POTASSIUM-HCTZ 100-12.5 MG PO TABS: 1.0000 | ORAL_TABLET | Freq: Every day | ORAL | Status: DC

## 2012-11-26 MED ORDER — LEVOFLOXACIN 750 MG PO TABS
750.0000 mg | ORAL_TABLET | Freq: Every day | ORAL | Status: DC
  Administered 2012-11-26 – 2012-11-28 (×3): 750 mg via ORAL
  Filled 2012-11-26 (×3): qty 1

## 2012-11-26 MED ORDER — HYDROCHLOROTHIAZIDE 25 MG PO TABS
12.5000 mg | ORAL_TABLET | Freq: Every day | ORAL | Status: DC
  Administered 2012-11-26: 12.5 mg via ORAL
  Filled 2012-11-26 (×2): qty 0.5

## 2012-11-26 NOTE — Progress Notes (Signed)
[facility] PROGRESS NOTE Interim History: 77 y.o. male who was discharged from the hospital Yesterday after a lumbar fusion on 11/22/2012, who presents to the ED with complaints of fever chills, SOB and increased Coughing. He reports having a productive cough of yellow mucus and a fever to 100.0 at home. He also reports having increased swelling from his ankles into the calves of both legs. He saw his PCP today and was advised to go to the hospital for further evaluation. He was evaluated in the ED and found to have an elevated D-dimer, Venous Doppler Studies were performed of Both Lower Extremities and were found to be negative for DVTs. Chest X-ray revealed Bronchitic Changes, and a BNP was found to be 1127, and a CTA of the chest was ordered. He was placed on IV antibiotic therapy to cover HCAP and referred for admission.     Assessment/Plan:  *HCAP/Fever/ SOB : - On Vanc, Levaquin and cefepime 2.21.2014. Deescalate treatment to levaquin. - Afebrile. - recently d/c from the hospital for spinal fusion. - negative till date Blood cultures. - PT consult.  Chronic diastolic CHF (congestive heart failure) - Echo on 2.20.2013: Ejection fraction was in the range of 55% to 60%.  Left ventricular diastolic function parameters were normal. - Metoprolol.   Diabetes mellitus - good control continue current treatment. - HbgA1c pending   Hypertension associated with diabetes - currently elevated. Hydralazine PRN. Continue metoprolol - cont to hold ACE, robaxin and HCTZ. - Has Ct angio of chest which was negative for PE. monitor renal function.   Code Status: full Family Communication: wife  Disposition Plan: home 3-4 days   Consultants:  none  Procedures:  none  Antibiotics:  Vancomycin, cefepime and Levaquin started on 11/25/2012  HPI/Subjective: Cough improving.  Objective: Filed Vitals:   11/25/12 2019 11/25/12 2143 11/26/12 0529 11/26/12 0815  BP: 142/74  160/77    Pulse: 71 70 67   Temp: 97.8 F (36.6 C)  98 F (36.7 C)   TempSrc: Oral  Oral   Resp: 20 18 20    Height:      Weight:   98.3 kg (216 lb 11.4 oz)   SpO2: 98%  97% 98%    Intake/Output Summary (Last 24 hours) at 11/26/12 1052 Last data filed at 11/26/12 0756  Gross per 24 hour  Intake    580 ml  Output   2750 ml  Net  -2170 ml   Filed Weights   11/24/12 2245 11/25/12 0610 11/26/12 0529  Weight: 101.8 kg (224 lb 6.9 oz) 100.3 kg (221 lb 1.9 oz) 98.3 kg (216 lb 11.4 oz)    Exam:  General: Alert, awake, oriented x3, in no acute distress.  HEENT: No bruits, no goiter. -JVD Heart: Regular rate and rhythm, without murmurs, rubs, gallops.  Lungs: Good air movement, crackles and rhonchi bilaterally. Abdomen: Soft, nontender, nondistended, positive bowel sounds.  Neuro: Grossly intact, nonfocal. Skin: Erythematous no drainage or tender to palpation at the incision site in the lumbar area.    Data Reviewed: Basic Metabolic Panel:  Recent Labs Lab 11/24/12 1648 11/25/12 0435 11/26/12 0510  NA 132* 134* 135  K 4.6 4.0 4.2  CL 96 100 101  CO2 27 26 26   GLUCOSE 144* 154* 173*  BUN 21 19 19   CREATININE 1.37* 1.31 1.30  CALCIUM 9.3 8.7 9.2   Liver Function Tests:  Recent Labs Lab 11/24/12 1648  AST 32  ALT 23  ALKPHOS 29*  BILITOT 0.5  PROT  7.2  ALBUMIN 3.5   No results found for this basename: LIPASE, AMYLASE,  in the last 168 hours No results found for this basename: AMMONIA,  in the last 168 hours CBC:  Recent Labs Lab 11/24/12 1648 11/25/12 0435  WBC 7.3 5.0  NEUTROABS 5.8  --   HGB 12.5* 11.0*  HCT 36.7* 31.8*  MCV 93.6 92.4  PLT 178 151   Cardiac Enzymes: No results found for this basename: CKTOTAL, CKMB, CKMBINDEX, TROPONINI,  in the last 168 hours BNP (last 3 results)  Recent Labs  11/24/12 1648  PROBNP 1127.0*   CBG:  Recent Labs Lab 11/25/12 0637 11/25/12 1102 11/25/12 1633 11/25/12 2113 11/26/12 0635  GLUCAP 152* 188* 165*  164* 156*    No results found for this or any previous visit (from the past 240 hour(s)).   Studies: Dg Chest 2 View  11/24/2012  *RADIOLOGY REPORT*  Clinical Data: Leg swelling, shortness of breath.  CHEST - 2 VIEW  Comparison: 11/15/2012  Findings: Mild peribronchial thickening.  Prior CABG.  Heart is normal size.  No confluent opacities or effusions.  No acute bony abnormality.  Degenerative changes in the thoracic spine.  IMPRESSION: Bronchitic changes.   Original Report Authenticated By: Charlett Nose, M.D.    Ct Angio Chest W/cm &/or Wo Cm  11/25/2012  *RADIOLOGY REPORT*  Clinical Data: Shortness of breath, leg swelling, and elevated D- dimer 2 days postop back surgery.  Cough.  CT ANGIOGRAPHY CHEST  Technique:  Multidetector CT imaging of the chest using the standard protocol during bolus administration of intravenous contrast. Multiplanar reconstructed images including MIPs were obtained and reviewed to evaluate the vascular anatomy.  Contrast: OMNIPAQUE IOHEXOL 350 MG/ML SOLN  Comparison: None.  Findings: The study is somewhat technically limited due to the contrast bolus.  There is moderately good visualization of the central and proximal segmental pulmonary arteries.  No specific filling defects are demonstrated in the visualized vessels. However mid and distal portions of segmental branches as well as peripheral branches are not well visualized and smaller pulmonary emboli are not excluded.  Mild cardiac enlargement.  Calcification in the mitral valve annulus.  Coronary artery calcifications.  Calcification in the thoracic aorta without aneurysm.  Postoperative changes in the mediastinum.  No significant mediastinal fluid collections.  The esophagus is mostly decompressed.  No significant mediastinal or axillary lymphadenopathy.  Moderate sized esophageal hiatal hernia. No pleural effusions.  Visualization of the lungs is limited due to respiratory motion artifact but there is no gross  focal consolidation or airspace disease noted.  Central airway thickening is suggested consistent with chronic bronchitis.  No evidence of pneumothorax.  Degenerative changes in the thoracic spine.  No destructive bone lesions appreciated.  Postoperative changes in the cervical spine.  IMPRESSION: Technically limited study due to poor contrast bolus but no large central pulmonary emboli are visualized.  Central airways thickening suggesting bronchitis.  Coronary artery disease. Moderate sized esophageal hiatal hernia.   Original Report Authenticated By: Burman Nieves, M.D.     Scheduled Meds: . albuterol  2.5 mg Nebulization Q6H  . aspirin EC  81 mg Oral Daily  . ceFEPime (MAXIPIME) IV  1 g Intravenous Q8H  . clopidogrel  75 mg Oral Daily  . digoxin  0.125 mg Oral Daily  . enoxaparin (LOVENOX) injection  40 mg Subcutaneous Daily  . fenofibrate  160 mg Oral Daily  . ferrous sulfate  325 mg Oral Q breakfast  . insulin aspart  0-5 Units  Subcutaneous QHS  . insulin aspart  0-9 Units Subcutaneous TID WC  . levofloxacin (LEVAQUIN) IV  750 mg Intravenous Q24H  . metoprolol succinate  50 mg Oral QAC breakfast  . multivitamin with minerals  1 tablet Oral Daily  . pantoprazole  40 mg Oral Daily  . vancomycin  1,500 mg Intravenous Q24H   Continuous Infusions: . sodium chloride Stopped (11/25/12 1152)     Radonna Ricker Rosine Beat  [facility] Pager [phone].  If 8PM-8AM, please contact night-coverage at [email or web address], password [facility] 11/26/2012, 10:52 AM  LOS: 2 days

## 2012-11-26 NOTE — Evaluation (Signed)
Physical Therapy Evaluation Patient Details Name: [name] MRN: [id number] DOB: 11/15/1935 Today's Date: 11/26/2012 Time: 3664-4034 PT Time Calculation (min): 14 min  PT Assessment / Plan / Recommendation Clinical Impression  Patient is a 77 yo male admitted with HCAP, with recent back surgery on 2/19.  Patient with increased DOE and gait deviations. Will benefit from acute PT to maximize activity tolerance/independence prior to discharge home with wife.  Do not anticipate any f/u PT needs.    PT Assessment  Patient needs continued PT services    Follow Up Recommendations  No PT follow up;Supervision/Assistance - 24 hour    Does the patient have the potential to tolerate intense rehabilitation      Barriers to Discharge None      Equipment Recommendations  None recommended by PT    Recommendations for Other Services     Frequency Min 3X/week    Precautions / Restrictions Precautions Precautions: Back Precaution Comments: Reviewed back precautions - patient recalled 2/3 Required Braces or Orthoses: Spinal Brace Spinal Brace: Lumbar corset;Applied in sitting position Restrictions Weight Bearing Restrictions: No   Pertinent Vitals/Pain Dyspnea 3/4 with gait     Mobility  Bed Mobility Bed Mobility: Rolling Right;Right Sidelying to Sit;Sitting - Scoot to Edge of Bed Rolling Right: 6: Modified independent (Device/Increase time);With rail Right Sidelying to Sit: 6: Modified independent (Device/Increase time);With rails;HOB flat Sitting - Scoot to Edge of Bed: 6: Modified independent (Device/Increase time);With rail Details for Bed Mobility Assistance: Patient able to recall proper technique for log rolling and moving to sitting while maintaining back precautions.  Patient donned back brace with min assist. Transfers Transfers: Sit to Stand;Stand to Sit Sit to Stand: 7: Independent;From bed Stand to Sit: 7: Independent;With upper extremity assist;With armrests;To  chair/3-in-1 Details for Transfer Assistance: Patient used proper technique and hand placement. Ambulation/Gait Ambulation/Gait Assistance: 7: Independent Ambulation Distance (Feet): 400 Feet Assistive device: None Ambulation/Gait Assistance Details: Verbal cues to stand upright and move at slower pace (primarily to minimize dyspnea).  Patient with dyspnea of 3/4 with gait. Gait Pattern: Step-through pattern;Decreased stride length;Right flexed knee in stance;Left flexed knee in stance;Trunk flexed;Decreased step length - right;Decreased step length - left Gait velocity: Quick pace with short steps.  Encouraged slower gait with longer steps to minimize dyspnea    Exercises     PT Diagnosis: Abnormality of gait;Generalized weakness  PT Problem List: Decreased activity tolerance;Cardiopulmonary status limiting activity PT Treatment Interventions: Gait training;Functional mobility training;Patient/family education   PT Goals Acute Rehab PT Goals PT Goal Formulation: With patient/family Time For Goal Achievement: 11/29/12 Potential to Achieve Goals: Good Pt will Ambulate: >150 feet;Independently (with dyspnea at or below 2/4) PT Goal: Ambulate - Progress: Goal set today  Visit Information  Last PT Received On: 11/26/12 Assistance Needed: +1    Subjective Data  Subjective: "I walked some this morning" Patient Stated Goal: To go home soon   Prior Functioning  Home Living Lives With: Spouse Available Help at Discharge: Family;Available 24 hours/day Type of Home: House Home Access: Stairs to enter [employer] of Steps: 1 Entrance Stairs-Rails: None Home Layout: One level Bathroom Shower/Tub: Walk-in shower;Door [employer] Toilet: Standard Home Adaptive Equipment: Walker - rolling;Bedside commode/3-in-1;Shower chair with back Prior Function Level of Independence: Independent Able to Take Stairs?: Yes Driving: Yes Vocation: Retired Musician: No  difficulties Dominant Hand: Right    Cognition  Cognition Overall Cognitive Status: Appears within functional limits for tasks assessed/performed Arousal/Alertness: Awake/alert Orientation Level: Oriented X4 / Intact  Behavior During Session: [facility] for tasks performed    Extremity/Trunk Assessment Right Upper Extremity Assessment RUE ROM/Strength/Tone: Within functional levels Left Upper Extremity Assessment LUE ROM/Strength/Tone: Within functional levels Right Lower Extremity Assessment RLE ROM/Strength/Tone: WFL for tasks assessed RLE Sensation: History of peripheral neuropathy Left Lower Extremity Assessment LLE ROM/Strength/Tone: WFL for tasks assessed LLE Sensation: History of peripheral neuropathy   Balance Balance Balance Assessed: Yes High Level Balance High Level Balance Activites: Turns;Sudden stops;Head turns High Level Balance Comments: No loss of balance with high level balance activities.  End of Session PT - End of Session Equipment Utilized During Treatment: Gait belt;Back brace Activity Tolerance: Patient limited by fatigue (Limited by dyspnea) Patient left: in chair;with call bell/phone within reach;with family/visitor present Nurse Communication: Mobility status (Encouraged ambulation with nursing in hallway)  GP     [name] 11/26/2012, 2:24 PM Durenda Hurt. Renaldo Fiddler, [facility] Acute Rehab Services Pager [phone]

## 2012-11-27 DIAGNOSIS — E119 Type 2 diabetes mellitus without complications: Secondary | ICD-10-CM | POA: Diagnosis not present

## 2012-11-27 DIAGNOSIS — J189 Pneumonia, unspecified organism: Secondary | ICD-10-CM | POA: Diagnosis not present

## 2012-11-27 DIAGNOSIS — E1169 Type 2 diabetes mellitus with other specified complication: Secondary | ICD-10-CM | POA: Diagnosis not present

## 2012-11-27 LAB — GLUCOSE, CAPILLARY
Glucose-Capillary: 156 mg/dL — ABNORMAL HIGH (ref 70–99)
Glucose-Capillary: 285 mg/dL — ABNORMAL HIGH (ref 70–99)
Glucose-Capillary: 339 mg/dL — ABNORMAL HIGH (ref 70–99)

## 2012-11-27 MED ORDER — METHYLPREDNISOLONE SODIUM SUCC 125 MG IJ SOLR
125.0000 mg | Freq: Two times a day (BID) | INTRAMUSCULAR | Status: DC
  Administered 2012-11-27 – 2012-11-28 (×3): 125 mg via INTRAVENOUS
  Filled 2012-11-27 (×4): qty 2

## 2012-11-27 MED ORDER — HYDROCHLOROTHIAZIDE 12.5 MG PO CAPS
12.5000 mg | ORAL_CAPSULE | Freq: Every day | ORAL | Status: DC
  Administered 2012-11-27 – 2012-11-28 (×2): 12.5 mg via ORAL
  Filled 2012-11-27 (×2): qty 1

## 2012-11-27 MED ORDER — LOSARTAN POTASSIUM 50 MG PO TABS
100.0000 mg | ORAL_TABLET | Freq: Every day | ORAL | Status: DC
  Administered 2012-11-27 – 2012-11-28 (×2): 100 mg via ORAL
  Filled 2012-11-27 (×2): qty 2

## 2012-11-27 MED ORDER — PREDNISONE 50 MG PO TABS
50.0000 mg | ORAL_TABLET | Freq: Every day | ORAL | Status: DC
  Administered 2012-11-28: 50 mg via ORAL
  Filled 2012-11-27 (×2): qty 1

## 2012-11-27 NOTE — Progress Notes (Signed)
Inpatient Diabetes Program Recommendations  AACE/ADA: New Consensus Statement on Inpatient Glycemic Control (2013)  Target Ranges:  Prepandial:   less than 140 mg/dL      Peak postprandial:   less than 180 mg/dL (1-2 hours)      Critically ill patients:  140 - 180 mg/dL   Results for DAEMION, MCNIEL (MRN [id number]) as of 11/27/2012 11:15  Ref. Range 11/26/2012 06:35 11/26/2012 11:43 11/26/2012 15:55 11/26/2012 21:08 11/27/2012 06:38  Glucose-Capillary Latest Range: 70-99 mg/dL 409 (H) 811 (H) 914 (H) 226 (H) 156 (H)    Inpatient Diabetes Program Recommendations Insulin - Basal: May want to consider starting low dose basal insulin. Correction (SSI): Please consider increasing Novolog correction scale to resistant since patient will be started on steroids today.  Note: Patient has a history of diabetes and takes Actoplus Met 15-500 mg QAM at home for diabetes management.  Currently, patient is ordered to receive Novolog sensitive correction ACHS for inpatient glycemic control.  Noted that patient will be starting on steroids today.  Therefore, anticipate increase in blood glucose from steroids.  Blood glucose has ranged from 156-230 mg/dl over the past 24 hours without any steroids.  May want to consider starting patient on low dose basal insulin.  Please consider increasing Novolog correction to resistant scale.  Will continue to follow.  Thanks, Orlando Penner, RN, BSN, CCRN Diabetes Coordinator Inpatient Diabetes Program [phone]

## 2012-11-27 NOTE — Progress Notes (Signed)
Physical Therapy Treatment Patient Details Name: [name] MRN: [id number] DOB: 06-Aug-1936 Today's Date: 11/27/2012 Time: 4696-2952 PT Time Calculation (min): 15 min  PT Assessment / Plan / Recommendation Comments on Treatment Session  Patient is at independent/supervision level with all mobility/gait.  Dons back brace independently.  No further PT needs - PT will sign off.  Encouraged patient to continue ambulation in hallway, and to slow pace to minimize dyspnea.    Follow Up Recommendations  No PT follow up;Supervision/Assistance - 24 hour     Does the patient have the potential to tolerate intense rehabilitation     Barriers to Discharge        Equipment Recommendations  None recommended by PT    Recommendations for Other Services    Frequency Min 3X/week   Plan All goals met and education completed, patient dischaged from PT services    Precautions / Restrictions Precautions Precautions: Back Precaution Comments: Patient recalls 3/3 back precautions Required Braces or Orthoses: Spinal Brace Spinal Brace: Lumbar corset;Applied in sitting position (Patient able to apply independently) Restrictions Weight Bearing Restrictions: No   Pertinent Vitals/Pain With slower gait, dyspnea 2/4.    Mobility  Bed Mobility Bed Mobility: Rolling Right;Right Sidelying to Sit;Sitting - Scoot to Edge of Bed Rolling Right: 6: Modified independent (Device/Increase time);With rail Right Sidelying to Sit: 6: Modified independent (Device/Increase time);With rails Sitting - Scoot to Edge of Bed: 7: Independent Details for Bed Mobility Assistance: No cues or assist needed Transfers Transfers: Sit to Stand;Stand to Sit Sit to Stand: 7: Independent;From bed Stand to Sit: 7: Independent;With upper extremity assist;With armrests;To chair/3-in-1 Details for Transfer Assistance: Patient used proper technique and hand placement. Ambulation/Gait Ambulation/Gait Assistance: 6: Modified  independent (Device/Increase time);7: Independent Ambulation Distance (Feet): 400 Feet Assistive device: Rolling walker;None Ambulation/Gait Assistance Details: Had patient use RW to slow gait speed, and to work on taking longer steps.  Patient reports this felt "awkward" and he was still SOB. However dyspnea 2/4 today.  Finished ambulation without assistive device.   Gait Pattern: Step-through pattern;Decreased stride length;Right flexed knee in stance;Left flexed knee in stance;Trunk flexed;Decreased step length - right;Decreased step length - left General Gait Details: Patient able to take longer slower steps using RW.  Without assistive device, patient returns to gait pattern with short fast steps.       PT Goals Acute Rehab PT Goals PT Goal: Ambulate - Progress: Met  Visit Information  Last PT Received On: 11/27/12 Assistance Needed: +1    Subjective Data  Subjective: "I tried taking longer steps.  I was still short of breath"   Cognition  Cognition Overall Cognitive Status: Appears within functional limits for tasks assessed/performed Arousal/Alertness: Awake/alert Orientation Level: Oriented X4 / Intact Behavior During Session: [facility] for tasks performed    Balance     End of Session PT - End of Session Equipment Utilized During Treatment: Gait belt;Back brace Activity Tolerance: Patient tolerated treatment well Patient left: in chair;with call bell/phone within reach;with family/visitor present Nurse Communication: Mobility status (Encouraged ambulation in hallway)   GP     [name] 11/27/2012, 12:45 PM Durenda Hurt. Renaldo Fiddler, [facility] Acute Rehab Services Pager [phone]

## 2012-11-27 NOTE — Progress Notes (Signed)
Patient evaluated for community based chronic disease management services with [facility] Care Management Program as a benefit of patient's [employer].  Spoke with patient and wife at bedside to explain [facility] Care Management services.  Patient indicated that he has a scale and is weighing at home.  He declines services at this time.  Left contact information and [facility] literature with patient. Of note, [facility] Care Management services does not replace or interfere with any services that are arranged by inpatient case management or social work.  For additional questions or referrals please contact Anibal Henderson BSN RN [facility] Liaison at [phone].

## 2012-11-27 NOTE — Progress Notes (Signed)
Physical Therapy Discharge Patient Details Name: [name] MRN: [id number] DOB: Dec 24, 1935 Today's Date: 11/27/2012 Time: 7829-5621 PT Time Calculation (min): 15 min  Patient discharged from PT services secondary to goals met and no further PT needs identified.  Please see latest therapy progress note for current level of functioning and progress toward goals.    Progress and discharge plan discussed with patient and/or caregiver: Patient/Caregiver agrees with plan  GP     [name] 11/27/2012, 12:46 PM

## 2012-11-27 NOTE — Progress Notes (Signed)
[facility] PROGRESS NOTE Interim History: 77 y.o. male who was discharged from the hospital Yesterday after a lumbar fusion on 11/22/2012, who presents to the ED with complaints of fever chills, SOB and increased Coughing. He reports having a productive cough of yellow mucus and a fever to 100.0 at home. He also reports having increased swelling from his ankles into the calves of both legs. He saw his PCP today and was advised to go to the hospital for further evaluation. He was evaluated in the ED and found to have an elevated D-dimer, Venous Doppler Studies were performed of Both Lower Extremities and were found to be negative for DVTs. Chest X-ray revealed Bronchitic Changes, and a BNP was found to be 1127, and a CTA of the chest was ordered. He was placed on IV antibiotic therapy to cover HCAP and referred for admission.     Assessment/Plan:  *HCAP/Fever/ SOB : - On Vanc, Levaquin and cefepime 2.21.2014. Deescalate treatment to levaquin. - Afebrile. - Now wheezing, start steroids. - negative till date Blood cultures.  Chronic diastolic CHF (congestive heart failure) - Echo on 2.20.2013: Ejection fraction was in the range of 55% to 60%.  Left ventricular diastolic function parameters were normal. - Metoprolol. Resume HCTZ and losartan.   Diabetes mellitus - good control continue current treatment. - HbgA1c pending   Hypertension associated with diabetes - currently elevated. Hydralazine PRN. Continue metoprolol - Resume losartan. - Has Ct angio of chest which was negative for PE. monitor renal function.   Code Status: full Family Communication: wife  Disposition Plan: home 3-4 days   Consultants:  none  Procedures:  none  Antibiotics:  Vancomycin, cefepime and Levaquin started on 11/25/2012  HPI/Subjective: Now SOB  Objective: Filed Vitals:   11/26/12 1407 11/26/12 1434 11/26/12 2049 11/27/12 0516  BP: 135/74  156/79 154/83  Pulse: 79  72 74  Temp: 97.7  F (36.5 C)  98.2 F (36.8 C) 98.3 F (36.8 C)  TempSrc: Oral  Oral Oral  Resp: 19  20 20   Height:      Weight:    97.2 kg (214 lb 4.6 oz)  SpO2: 96% 98% 100% 99%    Intake/Output Summary (Last 24 hours) at 11/27/12 0933 Last data filed at 11/27/12 1191  Gross per 24 hour  Intake    960 ml  Output   1600 ml  Net   -640 ml   Filed Weights   11/25/12 0610 11/26/12 0529 11/27/12 0516  Weight: 100.3 kg (221 lb 1.9 oz) 98.3 kg (216 lb 11.4 oz) 97.2 kg (214 lb 4.6 oz)    Exam:  General: Alert, awake, oriented x3, in no acute distress.  HEENT: No bruits, no goiter. -JVD Heart: Regular rate and rhythm, without murmurs, rubs, gallops.  Lungs: Good air movement, wheezing B/l and ronchi Skin: Erythematous no drainage or tender to palpation at the incision site in the lumbar area.    Data Reviewed: Basic Metabolic Panel:  Recent Labs Lab 11/24/12 1648 11/25/12 0435 11/26/12 0510  NA 132* 134* 135  K 4.6 4.0 4.2  CL 96 100 101  CO2 27 26 26   GLUCOSE 144* 154* 173*  BUN 21 19 19   CREATININE 1.37* 1.31 1.30  CALCIUM 9.3 8.7 9.2   Liver Function Tests:  Recent Labs Lab 11/24/12 1648  AST 32  ALT 23  ALKPHOS 29*  BILITOT 0.5  PROT 7.2  ALBUMIN 3.5   No results found for this basename: LIPASE, AMYLASE,  in  the last 168 hours No results found for this basename: AMMONIA,  in the last 168 hours CBC:  Recent Labs Lab 11/24/12 1648 11/25/12 0435  WBC 7.3 5.0  NEUTROABS 5.8  --   HGB 12.5* 11.0*  HCT 36.7* 31.8*  MCV 93.6 92.4  PLT 178 151   Cardiac Enzymes: No results found for this basename: CKTOTAL, CKMB, CKMBINDEX, TROPONINI,  in the last 168 hours BNP (last 3 results)  Recent Labs  11/24/12 1648  PROBNP 1127.0*   CBG:  Recent Labs Lab 11/26/12 0635 11/26/12 1143 11/26/12 1555 11/26/12 2108 11/27/12 0638  GLUCAP 156* 177* 230* 226* 156*    Recent Results (from the past 240 hour(s))  CULTURE, BLOOD (ROUTINE X 2)     Status: None    Collection Time    11/25/12 12:45 PM      Result Value Range Status   Specimen Description BLOOD RIGHT ARM   Final   Special Requests BOTTLES DRAWN AEROBIC AND ANAEROBIC 10CC   Final   Culture  Setup Time 11/25/2012 20:10   Final   Culture     Final   Value:        BLOOD CULTURE RECEIVED NO GROWTH TO DATE CULTURE WILL BE HELD FOR 5 DAYS BEFORE ISSUING A FINAL NEGATIVE REPORT   Report Status PENDING   Incomplete  CULTURE, BLOOD (ROUTINE X 2)     Status: None   Collection Time    11/25/12 12:55 PM      Result Value Range Status   Specimen Description BLOOD RIGHT HAND   Final   Special Requests BOTTLES DRAWN AEROBIC AND ANAEROBIC 10CC   Final   Culture  Setup Time 11/25/2012 20:10   Final   Culture     Final   Value:        BLOOD CULTURE RECEIVED NO GROWTH TO DATE CULTURE WILL BE HELD FOR 5 DAYS BEFORE ISSUING A FINAL NEGATIVE REPORT   Report Status PENDING   Incomplete     Studies: No results found.  Scheduled Meds: . albuterol  2.5 mg Nebulization Q6H  . aspirin EC  81 mg Oral Daily  . clopidogrel  75 mg Oral Daily  . digoxin  0.125 mg Oral Daily  . enoxaparin (LOVENOX) injection  40 mg Subcutaneous Daily  . fenofibrate  160 mg Oral Daily  . ferrous sulfate  325 mg Oral Q breakfast  . hydrochlorothiazide  12.5 mg Oral Daily  . insulin aspart  0-5 Units Subcutaneous QHS  . insulin aspart  0-9 Units Subcutaneous TID WC  . levofloxacin  750 mg Oral Daily  . methylPREDNISolone (SOLU-MEDROL) injection  125 mg Intravenous Q12H  . metoprolol succinate  50 mg Oral QAC breakfast  . multivitamin with minerals  1 tablet Oral Daily  . pantoprazole  40 mg Oral Daily  . [START ON 11/28/2012] predniSONE  50 mg Oral Q breakfast   Continuous Infusions: . sodium chloride Stopped (11/25/12 1152)     Radonna Ricker Rosine Beat  [facility] Pager [phone].  If 8PM-8AM, please contact night-coverage at [email or web address], password [facility] 11/27/2012, 9:33 AM  LOS: 3 days

## 2012-11-28 DIAGNOSIS — I5032 Chronic diastolic (congestive) heart failure: Secondary | ICD-10-CM

## 2012-11-28 DIAGNOSIS — J189 Pneumonia, unspecified organism: Secondary | ICD-10-CM

## 2012-11-28 MED ORDER — ALBUTEROL SULFATE (5 MG/ML) 0.5% IN NEBU: 2.5000 mg | INHALATION_SOLUTION | Freq: Two times a day (BID) | RESPIRATORY_TRACT | Status: DC

## 2012-11-28 MED ORDER — PREDNISONE 10 MG PO TABS: ORAL_TABLET | ORAL | Status: DC

## 2012-11-28 MED ORDER — LEVOFLOXACIN 750 MG PO TABS: 750.0000 mg | ORAL_TABLET | Freq: Every day | ORAL | Status: DC

## 2012-11-28 NOTE — Discharge Summary (Signed)
Physician Discharge Summary  [name] ZOX:[id number] DOB: 1935-12-22 DOA: 11/24/2012  PCP: Carollee Herter, MD  Admit date: 11/24/2012 Discharge date: 11/28/2012  Time spent: 30 minutes  Recommendations for Outpatient Follow-up:  1. Follow up with PCP evaluate his respiration status  Discharge Diagnoses:  Principal Problem:   HCAP (healthcare-associated pneumonia) Active Problems:   Diabetes mellitus   Hypertension associated with diabetes   Chronic diastolic CHF (congestive heart failure)   Fever   Hyponatremia   Discharge Condition: Stable  Diet recommendation: Regular  Filed Weights   11/26/12 0529 11/27/12 0516 11/28/12 0600  Weight: 98.3 kg (216 lb 11.4 oz) 97.2 kg (214 lb 4.6 oz) 95.709 kg (211 lb)    History of present illness:  77 y.o. male who was discharged from the hospital Yesterday after a lumbar fusion on 11/22/2012, who presents to the ED with complaints of fever chills, SOB and increased Coughing. He reports having a productive cough of yellow mucus and a fever to 100.0 at home. He also reports having increased swelling from his ankles into the calves of both legs. He saw his PCP today and was advised to go to the hospital for further evaluation. He was evaluated in the ED and found to have an elevated D-dimer, Venous Doppler Studies were performed of Both Lower Extremities and were found to be negative for DVTs. Chest X-ray revealed Bronchitic Changes, and a BNP was found to be 1127, and a CTA of the chest was ordered. He was placed on IV antibiotic therapy to cover HCAP and referred for admission.    Hospital Course:  *HCAP/Fever/ SOB :  - On Vanc, Levaquin and cefepime 2.21.2014. Deescalate treatment to levaquin. She will continue as an outpatient. - Afebrile.  - Initially started on IV Solu-Medrol, then changed steroid taper orally. - negative till date Blood cultures.   Chronic diastolic CHF (congestive heart failure)  - Echo on 2.20.2013:  Ejection fraction was in the range of 55% to 60%. Left ventricular diastolic function parameters were normal.  - Metoprolol. Resume HCTZ and losartan.   Diabetes mellitus  - good control continue current treatment.  - HbgA1c pending   Hypertension associated with diabetes  - currently elevated. Hydralazine PRN. Continue metoprolol  - Resume losartan.  - Has Ct angio of chest which was negative for PE. monitor renal function.    Procedures:  none  Consultations:  none  Discharge Exam: Filed Vitals:   11/27/12 1432 11/27/12 1443 11/27/12 2045 11/28/12 0600  BP:  147/60 160/71 136/69  Pulse: 78 75 79 75  Temp:  97.7 F (36.5 C) 97.7 F (36.5 C) 97 F (36.1 C)  TempSrc:  Oral Oral Oral  Resp: 18 19 20 19   Height:      Weight:    95.709 kg (211 lb)  SpO2: 98% 93% 93% 97%    General: Awake alert and oriented x3 Cardiovascular: Regular rate and rhythm Respiratory: Good air movement clear to auscultation  Discharge Instructions  Discharge Orders   Future Orders Complete By Expires     Diet - low sodium heart healthy  As directed     Increase activity slowly  As directed         Medication List    TAKE these medications       aspirin 81 MG tablet  Take 81 mg by mouth daily.     Choline Fenofibrate 135 MG capsule  Take 135 mg by mouth daily.     clopidogrel 75 MG  tablet  Commonly known as:  PLAVIX  Take 75 mg by mouth daily.     digoxin 0.125 MG tablet  Commonly known as:  LANOXIN  Take 0.125 mg by mouth daily.     ferrous sulfate 325 (65 FE) MG tablet  Take 325 mg by mouth daily with breakfast.     levofloxacin 750 MG tablet  Commonly known as:  LEVAQUIN  Take 1 tablet (750 mg total) by mouth daily.     losartan-hydrochlorothiazide 100-12.5 MG per tablet  Commonly known as:  HYZAAR  Take 1 tablet by mouth daily.     methocarbamol 500 MG tablet  Commonly known as:  ROBAXIN  Take 500 mg by mouth every 6 (six) hours as needed. For pain      metoprolol succinate 50 MG 24 hr tablet  Commonly known as:  TOPROL-XL  Take 50 mg by mouth daily before breakfast.     multivitamin with minerals tablet  Take 1 tablet by mouth daily.     nortriptyline 10 MG capsule  Commonly known as:  PAMELOR  10 mg daily as needed. Helps with legs per wife     omeprazole 20 MG capsule  Commonly known as:  PRILOSEC  Take 20 mg by mouth daily before breakfast.     oxyCODONE 5 MG immediate release tablet  Commonly known as:  Oxy IR/ROXICODONE  Take 1-2 tablets (5-10 mg total) by mouth every 6 (six) hours as needed for pain.     pioglitazone-metformin 15-500 MG per tablet  Commonly known as:  ACTOPLUS MET  Take 1 tablet by mouth daily with breakfast.     predniSONE 10 MG tablet  Commonly known as:  DELTASONE  Takes 6 tablets for 1 days, then 5 tablets for 1 days, then 4 tablets for 1 days, then 3 tablets for 1 days, then 2 tabs for 1 days, then 1 tab for 1 days, and then stop.     vardenafil 20 MG tablet  Commonly known as:  LEVITRA  Take 20 mg by mouth daily as needed. For erectile dysfunction           Follow-up Information   Follow up with Carollee Herter, MD In 2 weeks. (hospital follow up)    Contact information:   [address] [place] [address] [phone]        The results of significant diagnostics from this hospitalization (including imaging, microbiology, ancillary and laboratory) are listed below for reference.    Significant Diagnostic Studies: Dg Chest 2 View  11/24/2012  *RADIOLOGY REPORT*  Clinical Data: Leg swelling, shortness of breath.  CHEST - 2 VIEW  Comparison: 11/15/2012  Findings: Mild peribronchial thickening.  Prior CABG.  Heart is normal size.  No confluent opacities or effusions.  No acute bony abnormality.  Degenerative changes in the thoracic spine.  IMPRESSION: Bronchitic changes.   Original Report Authenticated By: Charlett Nose, M.D.    Chest 2 View  11/15/2012  *RADIOLOGY REPORT*   Clinical Data: Preop  CHEST - 2 VIEW  Comparison: 04/21/2010  Findings: Cardiomediastinal silhouette is stable.  Status post median sternotomy.  No acute infiltrate or pleural effusion.  No pulmonary edema.  Stable degenerative changes thoracic spine.  IMPRESSION: No active disease.  No significant change.   Original Report Authenticated By: Natasha Mead, M.D.    Dg Lumbar Spine 2-3 Views  11/22/2012  *RADIOLOGY REPORT*  Clinical Data: Back pain  DG C-ARM 61-120 MIN,LUMBAR SPINE - 2-3 VIEW  Comparison: CT myelogram  08/02/2012  Findings: C-arm films document L3-4 XLIF with unilateral pedicle screw construct on the left.  IMPRESSION: Satisfactory position and alignment.   Original Report Authenticated By: Davonna Belling, M.D.    Ct Angio Chest W/cm &/or Wo Cm  11/25/2012  *RADIOLOGY REPORT*  Clinical Data: Shortness of breath, leg swelling, and elevated D- dimer 2 days postop back surgery.  Cough.  CT ANGIOGRAPHY CHEST  Technique:  Multidetector CT imaging of the chest using the standard protocol during bolus administration of intravenous contrast. Multiplanar reconstructed images including MIPs were obtained and reviewed to evaluate the vascular anatomy.  Contrast: OMNIPAQUE IOHEXOL 350 MG/ML SOLN  Comparison: None.  Findings: The study is somewhat technically limited due to the contrast bolus.  There is moderately good visualization of the central and proximal segmental pulmonary arteries.  No specific filling defects are demonstrated in the visualized vessels. However mid and distal portions of segmental branches as well as peripheral branches are not well visualized and smaller pulmonary emboli are not excluded.  Mild cardiac enlargement.  Calcification in the mitral valve annulus.  Coronary artery calcifications.  Calcification in the thoracic aorta without aneurysm.  Postoperative changes in the mediastinum.  No significant mediastinal fluid collections.  The esophagus is mostly decompressed.  No  significant mediastinal or axillary lymphadenopathy.  Moderate sized esophageal hiatal hernia. No pleural effusions.  Visualization of the lungs is limited due to respiratory motion artifact but there is no gross focal consolidation or airspace disease noted.  Central airway thickening is suggested consistent with chronic bronchitis.  No evidence of pneumothorax.  Degenerative changes in the thoracic spine.  No destructive bone lesions appreciated.  Postoperative changes in the cervical spine.  IMPRESSION: Technically limited study due to poor contrast bolus but no large central pulmonary emboli are visualized.  Central airways thickening suggesting bronchitis.  Coronary artery disease. Moderate sized esophageal hiatal hernia.   Original Report Authenticated By: Burman Nieves, M.D.    Dg C-arm [phone] Min  11/22/2012  *RADIOLOGY REPORT*  Clinical Data: Back pain  DG C-ARM 61-120 MIN,LUMBAR SPINE - 2-3 VIEW  Comparison: CT myelogram 08/02/2012  Findings: C-arm films document L3-4 XLIF with unilateral pedicle screw construct on the left.  IMPRESSION: Satisfactory position and alignment.   Original Report Authenticated By: Davonna Belling, M.D.     Microbiology: Recent Results (from the past 240 hour(s))  CULTURE, BLOOD (ROUTINE X 2)     Status: None   Collection Time    11/25/12 12:45 PM      Result Value Range Status   Specimen Description BLOOD RIGHT ARM   Final   Special Requests BOTTLES DRAWN AEROBIC AND ANAEROBIC 10CC   Final   Culture  Setup Time 11/25/2012 20:10   Final   Culture     Final   Value:        BLOOD CULTURE RECEIVED NO GROWTH TO DATE CULTURE WILL BE HELD FOR 5 DAYS BEFORE ISSUING A FINAL NEGATIVE REPORT   Report Status PENDING   Incomplete  CULTURE, BLOOD (ROUTINE X 2)     Status: None   Collection Time    11/25/12 12:55 PM      Result Value Range Status   Specimen Description BLOOD RIGHT HAND   Final   Special Requests BOTTLES DRAWN AEROBIC AND ANAEROBIC 10CC   Final   Culture   Setup Time 11/25/2012 20:10   Final   Culture     Final   Value:  BLOOD CULTURE RECEIVED NO GROWTH TO DATE CULTURE WILL BE HELD FOR 5 DAYS BEFORE ISSUING A FINAL NEGATIVE REPORT   Report Status PENDING   Incomplete     Labs: Basic Metabolic Panel:  Recent Labs Lab 11/24/12 1648 11/25/12 0435 11/26/12 0510  NA 132* 134* 135  K 4.6 4.0 4.2  CL 96 100 101  CO2 27 26 26   GLUCOSE 144* 154* 173*  BUN 21 19 19   CREATININE 1.37* 1.31 1.30  CALCIUM 9.3 8.7 9.2   Liver Function Tests:  Recent Labs Lab 11/24/12 1648  AST 32  ALT 23  ALKPHOS 29*  BILITOT 0.5  PROT 7.2  ALBUMIN 3.5   No results found for this basename: LIPASE, AMYLASE,  in the last 168 hours No results found for this basename: AMMONIA,  in the last 168 hours CBC:  Recent Labs Lab 11/24/12 1648 11/25/12 0435  WBC 7.3 5.0  NEUTROABS 5.8  --   HGB 12.5* 11.0*  HCT 36.7* 31.8*  MCV 93.6 92.4  PLT 178 151   Cardiac Enzymes: No results found for this basename: CKTOTAL, CKMB, CKMBINDEX, TROPONINI,  in the last 168 hours BNP: BNP (last 3 results)  Recent Labs  11/24/12 1648  PROBNP 1127.0*   CBG:  Recent Labs Lab 11/27/12 0638 11/27/12 1104 11/27/12 1623 11/27/12 2055 11/28/12 0558  GLUCAP 156* 150* 285* 339* 335*       Signed:  Marinda Elk  [facility] 11/28/2012, 10:11 AM

## 2012-11-28 NOTE — Progress Notes (Signed)
Patient's IV and telemetry has been discontinued; patient and wife has been educated and verbalizes understanding of discharge instructions.  Patient is being discharged home with wife.  Lorretta Harp RN

## 2012-12-01 LAB — CULTURE, BLOOD (ROUTINE X 2): Culture: NO GROWTH

## 2012-12-12 DIAGNOSIS — I2510 Atherosclerotic heart disease of native coronary artery without angina pectoris: Secondary | ICD-10-CM | POA: Diagnosis not present

## 2012-12-12 DIAGNOSIS — E782 Mixed hyperlipidemia: Secondary | ICD-10-CM | POA: Diagnosis not present

## 2012-12-14 ENCOUNTER — Encounter: Payer: Self-pay | Admitting: Family Medicine

## 2012-12-14 ENCOUNTER — Ambulatory Visit: Payer: Medicare Other | Admitting: Family Medicine

## 2012-12-14 VITALS — BP 130/72 | HR 88 | Wt 214.0 lb

## 2012-12-14 DIAGNOSIS — E1149 Type 2 diabetes mellitus with other diabetic neurological complication: Secondary | ICD-10-CM

## 2012-12-14 DIAGNOSIS — J189 Pneumonia, unspecified organism: Secondary | ICD-10-CM

## 2012-12-14 DIAGNOSIS — Z79899 Other long term (current) drug therapy: Secondary | ICD-10-CM

## 2012-12-14 MED ORDER — NORTRIPTYLINE HCL 25 MG PO CAPS: 25.0000 mg | ORAL_CAPSULE | Freq: Every day | ORAL | Status: DC

## 2012-12-14 NOTE — Progress Notes (Signed)
  Subjective:    Patient ID: [name], male    DOB: [birth year], 77 y.o.   MRN: [id number]  HPI He is here for followup visit after recent hospitalization and treatment for pneumonia. The hospital record was reviewed. At this time he is having no chest pain, shortness of breath, fever chills or coughing. He does note that he stopped taking nortriptyline stating he read that it was an antidepressant and he felt that was unnecessary. He still is having some foot tingling and pain.   Review of Systems     Objective:   Physical Exam Alert and in no distress. Pulse ox is 97. Lungs clear to auscultation. Cardiac exam shows regular rhythm without murmurs or gallops.       Assessment & Plan:  Healthcare-associated pneumonia  Diabetic neuropathy, type II diabetes mellitus - Plan: nortriptyline (PAMELOR) 25 MG capsule  Encounter for long-term (current) use of other medications at this time he is doing much better. He is talked to his neurosurgeon about increasing physical activities and does plan to get more active and go out and fish. I also discussed the nortriptyline stating we're using this for pain control. I will give him 25 mg since 10 mg didn't work. He is to call me in a week or 2 and let he know he is doing.

## 2012-12-14 NOTE — Patient Instructions (Signed)
Take the nortriptyline at night and see what it will do to help your tingling sensation in the feet. Call me in the week if you're not feeling better and I will tell you to increase it

## 2013-01-02 DIAGNOSIS — M4310 Spondylolisthesis, site unspecified: Secondary | ICD-10-CM | POA: Diagnosis not present

## 2013-01-29 ENCOUNTER — Encounter: Payer: Self-pay | Admitting: Cardiology

## 2013-02-02 DIAGNOSIS — H2510 Age-related nuclear cataract, unspecified eye: Secondary | ICD-10-CM | POA: Diagnosis not present

## 2013-02-02 DIAGNOSIS — H35379 Puckering of macula, unspecified eye: Secondary | ICD-10-CM | POA: Diagnosis not present

## 2013-02-02 DIAGNOSIS — H179 Unspecified corneal scar and opacity: Secondary | ICD-10-CM | POA: Diagnosis not present

## 2013-02-02 DIAGNOSIS — H40019 Open angle with borderline findings, low risk, unspecified eye: Secondary | ICD-10-CM | POA: Diagnosis not present

## 2013-02-02 DIAGNOSIS — H353190 Nonexudative age-related macular degeneration, unspecified eye, stage unspecified: Secondary | ICD-10-CM | POA: Diagnosis not present

## 2013-02-02 DIAGNOSIS — H02839 Dermatochalasis of unspecified eye, unspecified eyelid: Secondary | ICD-10-CM | POA: Diagnosis not present

## 2013-02-02 DIAGNOSIS — E119 Type 2 diabetes mellitus without complications: Secondary | ICD-10-CM | POA: Diagnosis not present

## 2013-02-02 DIAGNOSIS — H43399 Other vitreous opacities, unspecified eye: Secondary | ICD-10-CM | POA: Diagnosis not present

## 2013-02-02 DIAGNOSIS — H5200 Hypermetropia, unspecified eye: Secondary | ICD-10-CM | POA: Diagnosis not present

## 2013-02-06 ENCOUNTER — Other Ambulatory Visit: Payer: Self-pay | Admitting: Family Medicine

## 2013-02-13 ENCOUNTER — Encounter: Payer: Self-pay | Admitting: Family Medicine

## 2013-02-13 ENCOUNTER — Other Ambulatory Visit: Payer: Self-pay | Admitting: Family Medicine

## 2013-03-06 DIAGNOSIS — M545 Low back pain: Secondary | ICD-10-CM | POA: Diagnosis not present

## 2013-03-06 DIAGNOSIS — M549 Dorsalgia, unspecified: Secondary | ICD-10-CM | POA: Diagnosis not present

## 2013-03-09 ENCOUNTER — Other Ambulatory Visit: Payer: Self-pay | Admitting: *Deleted

## 2013-03-09 MED ORDER — PANTOPRAZOLE SODIUM 40 MG PO TBEC: 40.0000 mg | DELAYED_RELEASE_TABLET | Freq: Every day | ORAL | Status: DC

## 2013-03-09 NOTE — Telephone Encounter (Signed)
Pt's wife called stating that Dr. Herbie [place] wanted [name] to have a Aorta doppler every 6 months. His last one was in November, there is no recall in the old system for this test. I just wanted to know if he needed to have it and if so can I get an order so that I can schedule it. Thank you.  [facility] MR # [id number]

## 2013-04-08 ENCOUNTER — Other Ambulatory Visit: Payer: Self-pay | Admitting: Family Medicine

## 2013-04-09 ENCOUNTER — Other Ambulatory Visit: Payer: Self-pay | Admitting: Neurological Surgery

## 2013-04-09 ENCOUNTER — Other Ambulatory Visit (HOSPITAL_COMMUNITY): Payer: Self-pay | Admitting: Cardiology

## 2013-04-09 ENCOUNTER — Telehealth (HOSPITAL_COMMUNITY): Payer: Self-pay | Admitting: Cardiology

## 2013-04-09 DIAGNOSIS — I2510 Atherosclerotic heart disease of native coronary artery without angina pectoris: Secondary | ICD-10-CM

## 2013-04-09 DIAGNOSIS — M542 Cervicalgia: Secondary | ICD-10-CM | POA: Diagnosis not present

## 2013-04-09 NOTE — Telephone Encounter (Signed)
Left message to call regarding scheduling an appt for testing

## 2013-04-16 ENCOUNTER — Encounter: Payer: Self-pay | Admitting: Family Medicine

## 2013-04-16 ENCOUNTER — Ambulatory Visit (INDEPENDENT_AMBULATORY_CARE_PROVIDER_SITE_OTHER): Payer: Medicare Other | Admitting: Family Medicine

## 2013-04-16 VITALS — BP 126/74 | HR 68 | Wt 210.0 lb

## 2013-04-16 DIAGNOSIS — E1169 Type 2 diabetes mellitus with other specified complication: Secondary | ICD-10-CM

## 2013-04-16 DIAGNOSIS — R05 Cough: Secondary | ICD-10-CM

## 2013-04-16 DIAGNOSIS — K219 Gastro-esophageal reflux disease without esophagitis: Secondary | ICD-10-CM | POA: Diagnosis not present

## 2013-04-16 DIAGNOSIS — I714 Abdominal aortic aneurysm, without rupture: Secondary | ICD-10-CM

## 2013-04-16 DIAGNOSIS — E119 Type 2 diabetes mellitus without complications: Secondary | ICD-10-CM | POA: Diagnosis not present

## 2013-04-16 DIAGNOSIS — E785 Hyperlipidemia, unspecified: Secondary | ICD-10-CM

## 2013-04-16 DIAGNOSIS — E1159 Type 2 diabetes mellitus with other circulatory complications: Secondary | ICD-10-CM

## 2013-04-16 DIAGNOSIS — T464X5A Adverse effect of angiotensin-converting-enzyme inhibitors, initial encounter: Secondary | ICD-10-CM | POA: Insufficient documentation

## 2013-04-16 DIAGNOSIS — Z8679 Personal history of other diseases of the circulatory system: Secondary | ICD-10-CM | POA: Insufficient documentation

## 2013-04-16 DIAGNOSIS — T44905A Adverse effect of unspecified drugs primarily affecting the autonomic nervous system, initial encounter: Secondary | ICD-10-CM

## 2013-04-16 DIAGNOSIS — I10 Essential (primary) hypertension: Secondary | ICD-10-CM

## 2013-04-16 LAB — POCT GLYCOSYLATED HEMOGLOBIN (HGB A1C): Hemoglobin A1C: 7.2

## 2013-04-16 NOTE — Progress Notes (Signed)
Subjective:    [name] is a 77 y.o. male who presents for follow-up of Type 2 diabetes mellitus.    Home blood sugar records: AROUND 150 TO 130. He checks intermittently  Current symptoms/problem:none Daily foot checks, foot concerns: YES/ TINGLING in feet and hands Last eye exam:  02/13/13  He does complain of right arm pain and is scheduled for an MRI Medication compliance:good Current diet: NO Current exercise: NONE Known diabetic complications: impotence and cardiovascular disease Cardiovascular risk factors: advanced age (older than 13 for men, 67 for women), diabetes mellitus, dyslipidemia, hypertension, male gender and sedentary lifestyle  He is scheduled for another abdominal ultrasound to evaluate his AAA The following portions of the patient's history were reviewed and updated as appropriate: allergies, current medications, past medical history, past social history, past surgical history and problem list.  ROS as in subjective above    Objective:    BP 126/74  Pulse 68  Wt 210 lb (95.255 kg)  BMI 31 kg/m2  Filed Vitals:   04/16/13 0810  BP: 126/74  Pulse: 68    General appearence: alert, no distress, WD/WN Neck: supple, no lymphadenopathy, no thyromegaly, no masses Heart: RRR, normal S1, S2, no murmurs Lungs: CTA bilaterally, no wheezes, rhonchi, or rales Abdomen: +bs, soft, non tender, non distended, no masses, no hepatomegaly, no splenomegaly Pulses: 2+ symmetric, upper and lower extremities, normal cap refill Ext: no edema   Lab Review Lab Results  Component Value Date   HGBA1C 7.8* 11/25/2012   Lab Results  Component Value Date   CHOL 224* 07/26/2012   HDL 34* 07/26/2012   LDLCALC 156* 07/26/2012   TRIG 172* 07/26/2012   CHOLHDL 6.6 07/26/2012   No results found for this basenameConcepcion Elk     Chemistry      Component Value Date/Time   NA 135 11/26/2012 0510   K 4.2 11/26/2012 0510   CL 101 11/26/2012 0510   CO2 26  11/26/2012 0510   BUN 19 11/26/2012 0510   CREATININE 1.30 11/26/2012 0510   CREATININE 1.38* 07/26/2012 1115      Component Value Date/Time   CALCIUM 9.2 11/26/2012 0510   ALKPHOS 29* 11/24/2012 1648   AST 32 11/24/2012 1648   ALT 23 11/24/2012 1648   BILITOT 0.5 11/24/2012 1648        Chemistry      Component Value Date/Time   NA 135 11/26/2012 0510   K 4.2 11/26/2012 0510   CL 101 11/26/2012 0510   CO2 26 11/26/2012 0510   BUN 19 11/26/2012 0510   CREATININE 1.30 11/26/2012 0510   CREATININE 1.38* 07/26/2012 1115      Component Value Date/Time   CALCIUM 9.2 11/26/2012 0510   ALKPHOS 29* 11/24/2012 1648   AST 32 11/24/2012 1648   ALT 23 11/24/2012 1648   BILITOT 0.5 11/24/2012 1648       Hemoglobin A1c is 7.2    Assessment:   Encounter Diagnoses  Name Primary?  . Diabetes mellitus Yes  . GERD (gastroesophageal reflux disease)   . Hyperlipidemia LDL goal <70   . Hypertension associated with diabetes   . AAA (abdominal aortic aneurysm) without rupture   . ACE-inhibitor cough          Plan:    1.  Rx changes: none 2.  Education: Reviewed 'ABCs' of diabetes management (respective goals in parentheses):  A1C (<7), blood pressure (<130/80), and cholesterol (LDL <100). 3.  Compliance at present is estimated to  be fair. Efforts to improve compliance (if necessary) will be directed at None. 4. Follow up: 4 months  Encouraged him to check his blood sugars periodically.

## 2013-04-17 ENCOUNTER — Ambulatory Visit
Admission: RE | Admit: 2013-04-17 | Discharge: 2013-04-17 | Disposition: A | Discharge status: Home / Self Care | Payer: Medicare Other | Source: Ambulatory Visit | Attending: Neurological Surgery | Admitting: Neurological Surgery

## 2013-04-17 DIAGNOSIS — M542 Cervicalgia: Secondary | ICD-10-CM

## 2013-04-17 DIAGNOSIS — M47812 Spondylosis without myelopathy or radiculopathy, cervical region: Secondary | ICD-10-CM | POA: Diagnosis not present

## 2013-04-23 ENCOUNTER — Encounter (HOSPITAL_COMMUNITY): Payer: Self-pay | Admitting: Pharmacy Technician

## 2013-04-23 DIAGNOSIS — M4802 Spinal stenosis, cervical region: Secondary | ICD-10-CM | POA: Diagnosis not present

## 2013-04-23 DIAGNOSIS — M542 Cervicalgia: Secondary | ICD-10-CM | POA: Diagnosis not present

## 2013-04-26 ENCOUNTER — Ambulatory Visit (HOSPITAL_COMMUNITY)
Admission: RE | Admit: 2013-04-26 | Discharge: 2013-04-26 | Disposition: A | Discharge status: Home / Self Care | Payer: Medicare Other | Source: Ambulatory Visit | Attending: Cardiovascular Disease | Admitting: Cardiovascular Disease

## 2013-04-26 DIAGNOSIS — I2510 Atherosclerotic heart disease of native coronary artery without angina pectoris: Secondary | ICD-10-CM | POA: Insufficient documentation

## 2013-04-26 DIAGNOSIS — I714 Abdominal aortic aneurysm, without rupture, unspecified: Secondary | ICD-10-CM | POA: Diagnosis not present

## 2013-04-26 NOTE — Progress Notes (Signed)
Aorta Duplex Completed.  [place],RDM, RVT

## 2013-04-27 ENCOUNTER — Other Ambulatory Visit (HOSPITAL_COMMUNITY): Payer: Self-pay | Admitting: Cardiology

## 2013-04-27 ENCOUNTER — Other Ambulatory Visit: Payer: Self-pay | Admitting: Neurological Surgery

## 2013-04-30 ENCOUNTER — Telehealth: Payer: Self-pay | Admitting: *Deleted

## 2013-04-30 NOTE — Telephone Encounter (Signed)
Rx was sent to pharmacy electronically. 

## 2013-04-30 NOTE — Telephone Encounter (Signed)
Message copied by Tobin Chad on Mon Apr 30, 2013  5:22 PM ------      Message from: [facility], DAVID      Created: Fri Apr 27, 2013  4:23 PM       Stable doppler evaluation of the aneurysm.        Follow-up recommended in 6 months ------

## 2013-04-30 NOTE — Telephone Encounter (Signed)
Spoke to patient. Results given. Voiced understanding. 

## 2013-05-01 ENCOUNTER — Encounter (HOSPITAL_COMMUNITY): Payer: Self-pay

## 2013-05-01 ENCOUNTER — Encounter (HOSPITAL_COMMUNITY)
Admission: RE | Admit: 2013-05-01 | Discharge: 2013-05-01 | Disposition: A | Discharge status: Home / Self Care | Payer: Medicare Other | Source: Ambulatory Visit | Attending: Neurological Surgery | Admitting: Neurological Surgery

## 2013-05-01 ENCOUNTER — Other Ambulatory Visit: Payer: Self-pay | Admitting: *Deleted

## 2013-05-01 DIAGNOSIS — I739 Peripheral vascular disease, unspecified: Secondary | ICD-10-CM | POA: Diagnosis present

## 2013-05-01 DIAGNOSIS — Z7902 Long term (current) use of antithrombotics/antiplatelets: Secondary | ICD-10-CM | POA: Diagnosis not present

## 2013-05-01 DIAGNOSIS — Z951 Presence of aortocoronary bypass graft: Secondary | ICD-10-CM | POA: Diagnosis not present

## 2013-05-01 DIAGNOSIS — M542 Cervicalgia: Secondary | ICD-10-CM | POA: Diagnosis not present

## 2013-05-01 DIAGNOSIS — M4802 Spinal stenosis, cervical region: Secondary | ICD-10-CM | POA: Diagnosis not present

## 2013-05-01 DIAGNOSIS — Z981 Arthrodesis status: Secondary | ICD-10-CM | POA: Diagnosis not present

## 2013-05-01 DIAGNOSIS — M5020 Other cervical disc displacement, unspecified cervical region: Secondary | ICD-10-CM | POA: Diagnosis not present

## 2013-05-01 DIAGNOSIS — Z7982 Long term (current) use of aspirin: Secondary | ICD-10-CM | POA: Diagnosis not present

## 2013-05-01 DIAGNOSIS — G4730 Sleep apnea, unspecified: Secondary | ICD-10-CM | POA: Diagnosis present

## 2013-05-01 DIAGNOSIS — Z9861 Coronary angioplasty status: Secondary | ICD-10-CM | POA: Diagnosis not present

## 2013-05-01 DIAGNOSIS — Q762 Congenital spondylolisthesis: Secondary | ICD-10-CM | POA: Diagnosis not present

## 2013-05-01 DIAGNOSIS — Z79899 Other long term (current) drug therapy: Secondary | ICD-10-CM | POA: Diagnosis not present

## 2013-05-01 DIAGNOSIS — M549 Dorsalgia, unspecified: Secondary | ICD-10-CM | POA: Diagnosis not present

## 2013-05-01 DIAGNOSIS — M7130 Other bursal cyst, unspecified site: Secondary | ICD-10-CM | POA: Diagnosis present

## 2013-05-01 DIAGNOSIS — Z87891 Personal history of nicotine dependence: Secondary | ICD-10-CM | POA: Diagnosis not present

## 2013-05-01 DIAGNOSIS — I252 Old myocardial infarction: Secondary | ICD-10-CM | POA: Diagnosis not present

## 2013-05-01 DIAGNOSIS — Z96659 Presence of unspecified artificial knee joint: Secondary | ICD-10-CM | POA: Diagnosis not present

## 2013-05-01 DIAGNOSIS — I2510 Atherosclerotic heart disease of native coronary artery without angina pectoris: Secondary | ICD-10-CM | POA: Diagnosis present

## 2013-05-01 DIAGNOSIS — K219 Gastro-esophageal reflux disease without esophagitis: Secondary | ICD-10-CM | POA: Diagnosis present

## 2013-05-01 DIAGNOSIS — M47812 Spondylosis without myelopathy or radiculopathy, cervical region: Secondary | ICD-10-CM | POA: Diagnosis not present

## 2013-05-01 DIAGNOSIS — E119 Type 2 diabetes mellitus without complications: Secondary | ICD-10-CM | POA: Diagnosis not present

## 2013-05-01 DIAGNOSIS — I10 Essential (primary) hypertension: Secondary | ICD-10-CM | POA: Diagnosis present

## 2013-05-01 DIAGNOSIS — E785 Hyperlipidemia, unspecified: Secondary | ICD-10-CM | POA: Diagnosis present

## 2013-05-01 DIAGNOSIS — I509 Heart failure, unspecified: Secondary | ICD-10-CM | POA: Diagnosis not present

## 2013-05-01 DIAGNOSIS — Z01812 Encounter for preprocedural laboratory examination: Secondary | ICD-10-CM | POA: Diagnosis not present

## 2013-05-01 DIAGNOSIS — M539 Dorsopathy, unspecified: Secondary | ICD-10-CM | POA: Diagnosis not present

## 2013-05-01 HISTORY — DX: Pneumonia, unspecified organism: J18.9

## 2013-05-01 LAB — BASIC METABOLIC PANEL
Calcium: 9.5 mg/dL (ref 8.4–10.5)
Creatinine, Ser: 1.48 mg/dL — ABNORMAL HIGH (ref 0.50–1.35)
GFR calc Af Amer: 52 mL/min — ABNORMAL LOW (ref >90)

## 2013-05-01 LAB — CBC WITH DIFFERENTIAL/PLATELET
Basophils Absolute: 0 10*3/uL (ref 0.0–0.1)
Basophils Relative: 0 % (ref 0–1)
Eosinophils Relative: 1 % (ref 0–5)
Lymphocytes Relative: 23 % (ref 12–46)
MCHC: 33 g/dL (ref 30.0–36.0)
MCV: 93.2 fL (ref 78.0–100.0)
Neutro Abs: 4.1 10*3/uL (ref 1.7–7.7)
Platelets: 195 10*3/uL (ref 150–400)
RDW: 13.7 % (ref 11.5–15.5)
WBC: 6 10*3/uL (ref 4.0–10.5)

## 2013-05-01 LAB — PROTIME-INR: Prothrombin Time: 13.3 seconds (ref 11.6–15.2)

## 2013-05-01 LAB — SURGICAL PCR SCREEN
MRSA, PCR: NEGATIVE
Staphylococcus aureus: NEGATIVE

## 2013-05-01 LAB — TYPE AND SCREEN
ABO/RH(D): A POS
Antibody Screen: NEGATIVE

## 2013-05-01 MED ORDER — CEFAZOLIN SODIUM-DEXTROSE 2-3 GM-% IV SOLR
2.0000 g | INTRAVENOUS | Status: AC
Start: 2013-05-02 — End: 2013-05-02
  Administered 2013-05-02: 2 g via INTRAVENOUS
  Filled 2013-05-01: qty 50

## 2013-05-01 MED ORDER — PANTOPRAZOLE SODIUM 40 MG PO TBEC: 40.0000 mg | DELAYED_RELEASE_TABLET | Freq: Every day | ORAL | Status: DC

## 2013-05-01 MED ORDER — METOPROLOL SUCCINATE ER 50 MG PO TB24: 50.0000 mg | ORAL_TABLET | Freq: Every day | ORAL | Status: DC

## 2013-05-01 MED ORDER — CHOLINE FENOFIBRATE 135 MG PO CPDR: 135.0000 mg | DELAYED_RELEASE_CAPSULE | Freq: Every day | ORAL | Status: DC

## 2013-05-01 NOTE — Pre-Procedure Instructions (Signed)
[name]  05/01/2013   Your procedure is scheduled on:  Wednesday May 02, 2013.  Report to [facility] at 9:00 AM.  Call this number if you have problems the morning of surgery: [phone]   Remember:   Do not eat food or drink liquids after midnight.   Take these medicines the morning of surgery with A SIP OF WATER: Digoxin (Lanoxin), Metoprolol (Toprol XL), Oxycodone (Percocet) if needed for pain, Pantoprazole (Protonix)    Do NOT take any diabetic medications the morning of your surgery   Do not wear jewelry  Do not wear lotions, powders, or colognes.  Men may shave face and neck.  Do not bring valuables to the hospital.  [facility] is not responsible for any belongings or valuables.  Contacts, dentures or bridgework may not be worn into surgery.  Leave suitcase in the car. After surgery it may be brought to your room.  For patients admitted to the hospital, checkout time is 11:00 AM the day of discharge.   Patients discharged the day of surgery will not be allowed to drive home.  Name and phone number of your driver: Family/Friend  Special Instructions: Shower using CHG tonight (05/01/13) and tomorrow morning (05/02/13) before surgery.  Shower tomorrow using CHG soap only.  Use special wash - you have one bottle of CHG for all showers.  You should use approximately 1/3 of the bottle for each shower.   Please read over the following fact sheets that you were given: Pain Booklet, Coughing and Deep Breathing, Blood Transfusion Information, MRSA Information and Surgical Site Infection Prevention

## 2013-05-01 NOTE — Progress Notes (Signed)
Anesthesia chart review: Patient is a 77 year old male scheduled for C7-T1 posterior cervical foraminotomies/fusion on 05/02/2013 by Dr. Yetta Barre.  He is s/p anterolateral retroperitoneal interbody fusion L3-4 on 11/22/12.  Other history includes CAD s/p PTCA '95 in the setting of acute lateral MI, subsequent CABG (LIMA to LAD, SVG to OM) '97, DES RCA '07, DM2, obesity, AAA (4.4 cm by ultrasound 04/26/13), HLD, GERD, ED, hiatal hernia, OSA, CHF, HNT, arthritis, neck surgery, former smoker.  PCP is Dr. Sharlot Gowda.  Cardiologist is Dr. Bryan Lemma.   Nuclear stress test on 09/22/12 showed: Low risk stress nuclear study demonstrating a moderate region on mid to basal inferior to inferolateral scar without significant ischemia. LV Wall Motion: Mild apical hypokinesis with EF 47%.   Echo on 09/22/12 showed: - Left ventricle: The cavity size was normal. Wall thickness was normal. Systolic function was normal. The estimated ejection fraction was in the range of 55% to 60%. Although no diagnostic regional wall motion abnormality was identified, this possibility cannot be completely excluded on the basis of this study. Left ventricular diastolic function parameters were normal. - Aortic valve: Valve area: 2.33cm^2(VTI). Valve area: 2.26cm^2 (Vmax). - Mitral valve: Calcified annulus. Mildly thickened leaflets . Valve area by pressure half-time: 1.73cm^2. Valve area by continuity equation (using LVOT flow): 2.43cm^2. - Left atrium: The atrium was moderately dilated. - Atrial septum: No defect or patent foramen ovale was identified. - Pulmonary arteries: PA peak pressure: 32mm Hg (S).  EKG on 11/27/12 showed NSR, incomplete right BBB.  CXR on 11/24/12 showed bronchitic changes.  Preoperative labs noted.  Patient had no significant ischemia on stress test less than one year ago.  He has since tolerated lumbar fusion.  If no acute changes then I would anticipate that he could proceed as  planned.  Velna Ochs [facility]/Anesthesiology Phone [phone] 05/01/2013 3:52 PM

## 2013-05-01 NOTE — Progress Notes (Signed)
Patient informed Nurse that Plavix was stopped a week ago.

## 2013-05-01 NOTE — Progress Notes (Signed)
Patient informed Nurse that he had a stress test within the past 5 years, and patient also has had cardiac cath with 6 stents. Cardiologist is Dr. Bryan Lemma. PCP is Dr. Sharlot Gowda.

## 2013-05-01 NOTE — Telephone Encounter (Signed)
Rx was sent to pharmacy electronically. 

## 2013-05-02 ENCOUNTER — Inpatient Hospital Stay (HOSPITAL_COMMUNITY): Payer: Medicare Other

## 2013-05-02 ENCOUNTER — Inpatient Hospital Stay (HOSPITAL_COMMUNITY)
Admission: RE | Admit: 2013-05-02 | Discharge: 2013-05-03 | DRG: 473 | Disposition: A | Discharge status: Home / Self Care | Payer: Medicare Other | Source: Ambulatory Visit | Attending: Neurological Surgery | Admitting: Neurological Surgery

## 2013-05-02 ENCOUNTER — Inpatient Hospital Stay (HOSPITAL_COMMUNITY): Payer: Medicare Other | Admitting: Anesthesiology

## 2013-05-02 ENCOUNTER — Encounter (HOSPITAL_COMMUNITY)
Admission: RE | Disposition: A | Discharge status: Home / Self Care | Payer: Self-pay | Source: Ambulatory Visit | Attending: Neurological Surgery

## 2013-05-02 ENCOUNTER — Encounter (HOSPITAL_COMMUNITY): Payer: Self-pay | Admitting: Vascular Surgery

## 2013-05-02 ENCOUNTER — Encounter (HOSPITAL_COMMUNITY): Payer: Self-pay | Admitting: Neurological Surgery

## 2013-05-02 DIAGNOSIS — K219 Gastro-esophageal reflux disease without esophagitis: Secondary | ICD-10-CM | POA: Diagnosis present

## 2013-05-02 DIAGNOSIS — M549 Dorsalgia, unspecified: Secondary | ICD-10-CM | POA: Diagnosis not present

## 2013-05-02 DIAGNOSIS — M47812 Spondylosis without myelopathy or radiculopathy, cervical region: Principal | ICD-10-CM | POA: Diagnosis present

## 2013-05-02 DIAGNOSIS — Z96659 Presence of unspecified artificial knee joint: Secondary | ICD-10-CM

## 2013-05-02 DIAGNOSIS — E119 Type 2 diabetes mellitus without complications: Secondary | ICD-10-CM | POA: Diagnosis present

## 2013-05-02 DIAGNOSIS — M4802 Spinal stenosis, cervical region: Secondary | ICD-10-CM | POA: Diagnosis not present

## 2013-05-02 DIAGNOSIS — M5020 Other cervical disc displacement, unspecified cervical region: Secondary | ICD-10-CM | POA: Diagnosis not present

## 2013-05-02 DIAGNOSIS — I739 Peripheral vascular disease, unspecified: Secondary | ICD-10-CM | POA: Diagnosis present

## 2013-05-02 DIAGNOSIS — I2510 Atherosclerotic heart disease of native coronary artery without angina pectoris: Secondary | ICD-10-CM | POA: Diagnosis present

## 2013-05-02 DIAGNOSIS — I252 Old myocardial infarction: Secondary | ICD-10-CM

## 2013-05-02 DIAGNOSIS — Z79899 Other long term (current) drug therapy: Secondary | ICD-10-CM

## 2013-05-02 DIAGNOSIS — Z87891 Personal history of nicotine dependence: Secondary | ICD-10-CM

## 2013-05-02 DIAGNOSIS — Z7902 Long term (current) use of antithrombotics/antiplatelets: Secondary | ICD-10-CM

## 2013-05-02 DIAGNOSIS — Q762 Congenital spondylolisthesis: Secondary | ICD-10-CM

## 2013-05-02 DIAGNOSIS — I10 Essential (primary) hypertension: Secondary | ICD-10-CM | POA: Diagnosis present

## 2013-05-02 DIAGNOSIS — G4730 Sleep apnea, unspecified: Secondary | ICD-10-CM | POA: Diagnosis present

## 2013-05-02 DIAGNOSIS — M7130 Other bursal cyst, unspecified site: Secondary | ICD-10-CM | POA: Diagnosis present

## 2013-05-02 DIAGNOSIS — Z951 Presence of aortocoronary bypass graft: Secondary | ICD-10-CM

## 2013-05-02 DIAGNOSIS — Z7982 Long term (current) use of aspirin: Secondary | ICD-10-CM

## 2013-05-02 DIAGNOSIS — Z01812 Encounter for preprocedural laboratory examination: Secondary | ICD-10-CM

## 2013-05-02 DIAGNOSIS — I509 Heart failure, unspecified: Secondary | ICD-10-CM | POA: Diagnosis not present

## 2013-05-02 DIAGNOSIS — Z981 Arthrodesis status: Secondary | ICD-10-CM

## 2013-05-02 DIAGNOSIS — Z9861 Coronary angioplasty status: Secondary | ICD-10-CM

## 2013-05-02 DIAGNOSIS — M539 Dorsopathy, unspecified: Secondary | ICD-10-CM | POA: Diagnosis not present

## 2013-05-02 DIAGNOSIS — E785 Hyperlipidemia, unspecified: Secondary | ICD-10-CM | POA: Diagnosis present

## 2013-05-02 HISTORY — PX: POSTERIOR CERVICAL FUSION/FORAMINOTOMY: SHX5038

## 2013-05-02 LAB — GLUCOSE, CAPILLARY
Glucose-Capillary: 158 mg/dL — ABNORMAL HIGH (ref 70–99)
Glucose-Capillary: 186 mg/dL — ABNORMAL HIGH (ref 70–99)

## 2013-05-02 SURGERY — POSTERIOR CERVICAL FUSION/FORAMINOTOMY LEVEL 1
Anesthesia: General | Site: Neck | Wound class: Clean

## 2013-05-02 MED ORDER — GLYCOPYRROLATE 0.2 MG/ML IJ SOLN
INTRAMUSCULAR | Status: DC | PRN
  Administered 2013-05-02: .4 mg via INTRAVENOUS

## 2013-05-02 MED ORDER — PHENOL 1.4 % MT LIQD: 1.0000 | OROMUCOSAL | Status: DC | PRN

## 2013-05-02 MED ORDER — LIDOCAINE HCL 4 % MT SOLN
OROMUCOSAL | Status: DC | PRN
  Administered 2013-05-02: 4 mL via TOPICAL

## 2013-05-02 MED ORDER — LOSARTAN POTASSIUM-HCTZ 100-12.5 MG PO TABS: 1.0000 | ORAL_TABLET | Freq: Every day | ORAL | Status: DC

## 2013-05-02 MED ORDER — NEOSTIGMINE METHYLSULFATE 1 MG/ML IJ SOLN
INTRAMUSCULAR | Status: DC | PRN
  Administered 2013-05-02: 3 mg via INTRAVENOUS

## 2013-05-02 MED ORDER — BACITRACIN 50000 UNITS IM SOLR
INTRAMUSCULAR | Status: AC
  Filled 2013-05-02: qty 1

## 2013-05-02 MED ORDER — METHOCARBAMOL 500 MG PO TABS
ORAL_TABLET | ORAL | Status: AC
  Filled 2013-05-02: qty 1

## 2013-05-02 MED ORDER — OXYCODONE-ACETAMINOPHEN 5-325 MG PO TABS
ORAL_TABLET | ORAL | Status: AC
  Administered 2013-05-02: 2 via ORAL
  Filled 2013-05-02: qty 2

## 2013-05-02 MED ORDER — CEFAZOLIN SODIUM 1-5 GM-% IV SOLN
1.0000 g | Freq: Three times a day (TID) | INTRAVENOUS | Status: AC
Start: 2013-05-02 — End: 2013-05-03
  Administered 2013-05-02 – 2013-05-03 (×2): 1 g via INTRAVENOUS
  Filled 2013-05-02 (×2): qty 50

## 2013-05-02 MED ORDER — DEXAMETHASONE SODIUM PHOSPHATE 10 MG/ML IJ SOLN
INTRAMUSCULAR | Status: AC
  Administered 2013-05-02: 10 mg via INTRAVENOUS
  Filled 2013-05-02: qty 1

## 2013-05-02 MED ORDER — PANTOPRAZOLE SODIUM 40 MG PO TBEC
40.0000 mg | DELAYED_RELEASE_TABLET | Freq: Every day | ORAL | Status: DC
  Administered 2013-05-03: 40 mg via ORAL
  Filled 2013-05-02: qty 1

## 2013-05-02 MED ORDER — FENTANYL CITRATE 0.05 MG/ML IJ SOLN
25.0000 ug | INTRAMUSCULAR | Status: DC | PRN
  Administered 2013-05-02: 50 ug via INTRAVENOUS

## 2013-05-02 MED ORDER — LACTATED RINGERS IV SOLN
INTRAVENOUS | Status: DC | PRN
  Administered 2013-05-02 (×2): via INTRAVENOUS

## 2013-05-02 MED ORDER — DEXAMETHASONE SODIUM PHOSPHATE 10 MG/ML IJ SOLN: 10.0000 mg | INTRAMUSCULAR | Status: DC

## 2013-05-02 MED ORDER — SODIUM CHLORIDE 0.9 % IV SOLN: 250.0000 mL | INTRAVENOUS | Status: DC

## 2013-05-02 MED ORDER — BUPIVACAINE HCL (PF) 0.25 % IJ SOLN
INTRAMUSCULAR | Status: DC | PRN
  Administered 2013-05-02: 3 mL

## 2013-05-02 MED ORDER — FENOFIBRATE 54 MG PO TABS
54.0000 mg | ORAL_TABLET | Freq: Every day | ORAL | Status: DC
  Administered 2013-05-02: 54 mg via ORAL
  Filled 2013-05-02 (×2): qty 1

## 2013-05-02 MED ORDER — SODIUM CHLORIDE 0.9 % IJ SOLN
3.0000 mL | Freq: Two times a day (BID) | INTRAMUSCULAR | Status: DC
  Administered 2013-05-02: 3 mL via INTRAVENOUS

## 2013-05-02 MED ORDER — ROCURONIUM BROMIDE 100 MG/10ML IV SOLN
INTRAVENOUS | Status: DC | PRN
  Administered 2013-05-02: 10 mg via INTRAVENOUS
  Administered 2013-05-02: 40 mg via INTRAVENOUS
  Administered 2013-05-02: 20 mg via INTRAVENOUS

## 2013-05-02 MED ORDER — DEXAMETHASONE 4 MG PO TABS
4.0000 mg | ORAL_TABLET | Freq: Four times a day (QID) | ORAL | Status: DC
  Administered 2013-05-02 – 2013-05-03 (×3): 4 mg via ORAL
  Filled 2013-05-02 (×7): qty 1

## 2013-05-02 MED ORDER — PROPOFOL 10 MG/ML IV BOLUS
INTRAVENOUS | Status: DC | PRN
  Administered 2013-05-02: 150 mg via INTRAVENOUS
  Administered 2013-05-02: 50 mg via INTRAVENOUS

## 2013-05-02 MED ORDER — MORPHINE SULFATE 2 MG/ML IJ SOLN
1.0000 mg | INTRAMUSCULAR | Status: DC | PRN
  Administered 2013-05-02: 4 mg via INTRAVENOUS
  Filled 2013-05-02: qty 2

## 2013-05-02 MED ORDER — HYDROCHLOROTHIAZIDE 12.5 MG PO CAPS
12.5000 mg | ORAL_CAPSULE | Freq: Every day | ORAL | Status: DC
  Administered 2013-05-02: 12.5 mg via ORAL
  Filled 2013-05-02 (×2): qty 1

## 2013-05-02 MED ORDER — PIOGLITAZONE HCL-METFORMIN HCL 15-500 MG PO TABS: 1.0000 | ORAL_TABLET | Freq: Every day | ORAL | Status: DC

## 2013-05-02 MED ORDER — ACETAMINOPHEN 650 MG RE SUPP: 650.0000 mg | RECTAL | Status: DC | PRN

## 2013-05-02 MED ORDER — METHOCARBAMOL 100 MG/ML IJ SOLN
500.0000 mg | Freq: Four times a day (QID) | INTRAMUSCULAR | Status: DC | PRN
  Filled 2013-05-02: qty 5

## 2013-05-02 MED ORDER — MENTHOL 3 MG MT LOZG: 1.0000 | LOZENGE | OROMUCOSAL | Status: DC | PRN

## 2013-05-02 MED ORDER — METFORMIN HCL 500 MG PO TABS
500.0000 mg | ORAL_TABLET | Freq: Every day | ORAL | Status: DC
  Administered 2013-05-02 – 2013-05-03 (×2): 500 mg via ORAL
  Filled 2013-05-02 (×3): qty 1

## 2013-05-02 MED ORDER — LIDOCAINE HCL (CARDIAC) 20 MG/ML IV SOLN
INTRAVENOUS | Status: DC | PRN
  Administered 2013-05-02: 60 mg via INTRAVENOUS

## 2013-05-02 MED ORDER — ONDANSETRON HCL 4 MG/2ML IJ SOLN
INTRAMUSCULAR | Status: DC | PRN
  Administered 2013-05-02: 4 mg via INTRAVENOUS

## 2013-05-02 MED ORDER — POTASSIUM CHLORIDE IN NACL 20-0.9 MEQ/L-% IV SOLN
INTRAVENOUS | Status: DC
  Filled 2013-05-02 (×3): qty 1000

## 2013-05-02 MED ORDER — DEXAMETHASONE SODIUM PHOSPHATE 4 MG/ML IJ SOLN
4.0000 mg | Freq: Four times a day (QID) | INTRAMUSCULAR | Status: DC
  Filled 2013-05-02 (×4): qty 1

## 2013-05-02 MED ORDER — SODIUM CHLORIDE 0.9 % IV SOLN
INTRAVENOUS | Status: AC
  Filled 2013-05-02: qty 500

## 2013-05-02 MED ORDER — FENTANYL CITRATE 0.05 MG/ML IJ SOLN
INTRAMUSCULAR | Status: DC | PRN
  Administered 2013-05-02: 50 ug via INTRAVENOUS
  Administered 2013-05-02 (×3): 100 ug via INTRAVENOUS
  Administered 2013-05-02: 50 ug via INTRAVENOUS
  Administered 2013-05-02: 100 ug via INTRAVENOUS

## 2013-05-02 MED ORDER — FENTANYL CITRATE 0.05 MG/ML IJ SOLN: 25.0000 ug | INTRAMUSCULAR | Status: DC | PRN

## 2013-05-02 MED ORDER — ONDANSETRON HCL 4 MG/2ML IJ SOLN: INTRAMUSCULAR | Status: DC | PRN

## 2013-05-02 MED ORDER — SODIUM CHLORIDE 0.9 % IR SOLN
Status: DC | PRN
  Administered 2013-05-02: 11:00:00

## 2013-05-02 MED ORDER — PIOGLITAZONE HCL 15 MG PO TABS
15.0000 mg | ORAL_TABLET | Freq: Every day | ORAL | Status: DC
  Administered 2013-05-02 – 2013-05-03 (×2): 15 mg via ORAL
  Filled 2013-05-02 (×3): qty 1

## 2013-05-02 MED ORDER — LOSARTAN POTASSIUM 50 MG PO TABS
100.0000 mg | ORAL_TABLET | Freq: Every day | ORAL | Status: DC
  Administered 2013-05-02: 100 mg via ORAL
  Filled 2013-05-02 (×2): qty 2

## 2013-05-02 MED ORDER — DIGOXIN 125 MCG PO TABS
0.1250 mg | ORAL_TABLET | Freq: Every day | ORAL | Status: DC
  Filled 2013-05-02: qty 1

## 2013-05-02 MED ORDER — ONDANSETRON HCL 4 MG/2ML IJ SOLN: 4.0000 mg | INTRAMUSCULAR | Status: DC | PRN

## 2013-05-02 MED ORDER — ACETAMINOPHEN 325 MG PO TABS: 650.0000 mg | ORAL_TABLET | ORAL | Status: DC | PRN

## 2013-05-02 MED ORDER — HEMOSTATIC AGENTS (NO CHARGE) OPTIME
TOPICAL | Status: DC | PRN
  Administered 2013-05-02: 1 via TOPICAL

## 2013-05-02 MED ORDER — METOPROLOL SUCCINATE ER 50 MG PO TB24
50.0000 mg | ORAL_TABLET | Freq: Every day | ORAL | Status: DC
  Administered 2013-05-03: 50 mg via ORAL
  Filled 2013-05-02 (×2): qty 1

## 2013-05-02 MED ORDER — THROMBIN 5000 UNITS EX SOLR
CUTANEOUS | Status: DC | PRN
  Administered 2013-05-02 (×3): 5000 [IU] via TOPICAL

## 2013-05-02 MED ORDER — LACTATED RINGERS IV SOLN
INTRAVENOUS | Status: DC
  Administered 2013-05-02: 20 mL/h via INTRAVENOUS

## 2013-05-02 MED ORDER — SODIUM CHLORIDE 0.9 % IJ SOLN: 3.0000 mL | INTRAMUSCULAR | Status: DC | PRN

## 2013-05-02 MED ORDER — METHOCARBAMOL 500 MG PO TABS
500.0000 mg | ORAL_TABLET | Freq: Four times a day (QID) | ORAL | Status: DC | PRN
  Administered 2013-05-02 – 2013-05-03 (×4): 500 mg via ORAL
  Filled 2013-05-02 (×4): qty 1

## 2013-05-02 MED ORDER — OXYCODONE-ACETAMINOPHEN 5-325 MG PO TABS
1.0000 | ORAL_TABLET | Freq: Four times a day (QID) | ORAL | Status: DC | PRN
  Administered 2013-05-02 – 2013-05-03 (×3): 2 via ORAL
  Filled 2013-05-02 (×3): qty 2

## 2013-05-02 MED ORDER — FENTANYL CITRATE 0.05 MG/ML IJ SOLN
INTRAMUSCULAR | Status: AC
Start: 2013-05-02 — End: 2013-05-02
  Administered 2013-05-02: 50 ug via INTRAVENOUS
  Filled 2013-05-02: qty 2

## 2013-05-02 MED ORDER — 0.9 % SODIUM CHLORIDE (POUR BTL) OPTIME
TOPICAL | Status: DC | PRN
  Administered 2013-05-02: 1000 mL

## 2013-05-02 SURGICAL SUPPLY — 56 items
APL SKNCLS STERI-STRIP NONHPOA (GAUZE/BANDAGES/DRESSINGS) ×1
BAG DECANTER FOR FLEXI CONT (MISCELLANEOUS) ×2 IMPLANT
BENZOIN TINCTURE PRP APPL 2/3 (GAUZE/BANDAGES/DRESSINGS) ×2 IMPLANT
BIT DRILL VUEPOINT II (BIT) IMPLANT
BLADE SURG ROTATE 9660 (MISCELLANEOUS) IMPLANT
BUR MATCHSTICK NEURO 3.0 LAGG (BURR) IMPLANT
CANISTER SUCTION 2500CC (MISCELLANEOUS) ×2 IMPLANT
CLOTH BEACON ORANGE TIMEOUT ST (SAFETY) ×2 IMPLANT
CONT SPEC 4OZ CLIKSEAL STRL BL (MISCELLANEOUS) ×2 IMPLANT
DRAPE C-ARM 42X72 X-RAY (DRAPES) ×4 IMPLANT
DRAPE LAPAROTOMY 100X72 PEDS (DRAPES) ×2 IMPLANT
DRAPE POUCH INSTRU U-SHP 10X18 (DRAPES) ×2 IMPLANT
DRESSING TELFA 8X3 (GAUZE/BANDAGES/DRESSINGS) ×2 IMPLANT
DRILL BIT VUEPOINT II (BIT) ×2
DRSG OPSITE 4X5.5 SM (GAUZE/BANDAGES/DRESSINGS) ×2 IMPLANT
DRSG OPSITE POSTOP 4X6 (GAUZE/BANDAGES/DRESSINGS) ×1 IMPLANT
DURAPREP 26ML APPLICATOR (WOUND CARE) ×2 IMPLANT
ELECT REM PT RETURN 9FT ADLT (ELECTROSURGICAL) ×2
ELECTRODE REM PT RTRN 9FT ADLT (ELECTROSURGICAL) ×1 IMPLANT
EVACUATOR 1/8 PVC DRAIN (DRAIN) ×1 IMPLANT
GAUZE SPONGE 4X4 16PLY XRAY LF (GAUZE/BANDAGES/DRESSINGS) IMPLANT
GLOVE BIO SURGEON STRL SZ8 (GLOVE) ×2 IMPLANT
GLOVE BIO SURGEON STRL SZ8.5 (GLOVE) ×1 IMPLANT
GLOVE BIOGEL M 8.0 STRL (GLOVE) ×1 IMPLANT
GLOVE OPTIFIT SS 8.5 STRL (GLOVE) ×1 IMPLANT
GLOVE SS BIOGEL STRL SZ 8 (GLOVE) IMPLANT
GLOVE SUPERSENSE BIOGEL SZ 8 (GLOVE) ×1
GOWN BRE IMP SLV AUR LG STRL (GOWN DISPOSABLE) IMPLANT
GOWN BRE IMP SLV AUR XL STRL (GOWN DISPOSABLE) ×1 IMPLANT
GOWN STRL REIN 2XL LVL4 (GOWN DISPOSABLE) IMPLANT
HEMOSTAT POWDER KIT SURGIFOAM (HEMOSTASIS) ×1 IMPLANT
KIT BASIN OR (CUSTOM PROCEDURE TRAY) ×2 IMPLANT
KIT ROOM TURNOVER OR (KITS) ×2 IMPLANT
MARKER SKIN DUAL TIP RULER LAB (MISCELLANEOUS) ×2 IMPLANT
NDL HYPO 25X1 1.5 SAFETY (NEEDLE) ×1 IMPLANT
NDL SPNL 20GX3.5 QUINCKE YW (NEEDLE) IMPLANT
NEEDLE HYPO 25X1 1.5 SAFETY (NEEDLE) ×2 IMPLANT
NEEDLE SPNL 20GX3.5 QUINCKE YW (NEEDLE) IMPLANT
NS IRRIG 1000ML POUR BTL (IV SOLUTION) ×2 IMPLANT
PACK LAMINECTOMY NEURO (CUSTOM PROCEDURE TRAY) ×2 IMPLANT
PAD ARMBOARD 7.5X6 YLW CONV (MISCELLANEOUS) ×2 IMPLANT
PIN MAYFIELD SKULL DISP (PIN) ×2 IMPLANT
ROD VUEPOINT 80MM (Rod) ×1 IMPLANT
SCREW MA MM 3.5X14 (Screw) ×6 IMPLANT
SCREW SET THREADED (Screw) ×6 IMPLANT
SPONGE LAP 4X18 X RAY DECT (DISPOSABLE) IMPLANT
SPONGE SURGIFOAM ABS GEL SZ50 (HEMOSTASIS) ×2 IMPLANT
STRIP CLOSURE SKIN 1/2X4 (GAUZE/BANDAGES/DRESSINGS) ×2 IMPLANT
SUT VIC AB 0 CT1 18XCR BRD8 (SUTURE) ×1 IMPLANT
SUT VIC AB 0 CT1 8-18 (SUTURE) ×2
SUT VIC AB 2-0 CP2 18 (SUTURE) ×2 IMPLANT
SUT VIC AB 3-0 SH 8-18 (SUTURE) ×2 IMPLANT
SYR 20ML ECCENTRIC (SYRINGE) ×2 IMPLANT
TOWEL OR 17X24 6PK STRL BLUE (TOWEL DISPOSABLE) ×2 IMPLANT
TOWEL OR 17X26 10 PK STRL BLUE (TOWEL DISPOSABLE) ×2 IMPLANT
WATER STERILE IRR 1000ML POUR (IV SOLUTION) ×2 IMPLANT

## 2013-05-02 NOTE — Progress Notes (Signed)
Patient ID: [name], male   DOB: [birth year], 77 y.o.   MRN: [id number] Doing great post-op. Neck sore, no arm pain. No weakness. MAE.

## 2013-05-02 NOTE — Progress Notes (Signed)
UR COMPLETED  

## 2013-05-02 NOTE — Anesthesia Preprocedure Evaluation (Signed)
Anesthesia Evaluation  Patient identified by MRN, date of birth, ID band Patient awake    Reviewed: Allergy & Precautions, H&P , NPO status , Patient's Chart, lab work & pertinent test results  Airway Mallampati: II      Dental   Pulmonary shortness of breath and with exertion, sleep apnea , pneumonia -,  breath sounds clear to auscultation        Cardiovascular hypertension, + CAD, + Past MI, + Peripheral Vascular Disease and +CHF Rhythm:Regular Rate:Normal     Neuro/Psych    GI/Hepatic Neg liver ROS, hiatal hernia, GERD-  ,  Endo/Other  diabetes  Renal/GU negative Renal ROS     Musculoskeletal   Abdominal   Peds  Hematology   Anesthesia Other Findings   Reproductive/Obstetrics                           Anesthesia Physical Anesthesia Plan  ASA: IV  Anesthesia Plan: General   Post-op Pain Management:    Induction: Intravenous  Airway Management Planned: Oral ETT  Additional Equipment:   Intra-op Plan:   Post-operative Plan: Extubation in OR  Informed Consent:   Dental advisory given  Plan Discussed with: CRNA, Anesthesiologist and Surgeon  Anesthesia Plan Comments:         Anesthesia Quick Evaluation

## 2013-05-02 NOTE — Anesthesia Postprocedure Evaluation (Signed)
  Anesthesia Post-op Note  Patient: [name]  Procedure(s) Performed: Procedure(s) with comments: POSTERIOR CERVICAL FUSION/FORAMINOTOMY CERVICAL SEVEN THORACIC-ONE (N/A) - POSTERIOR CERVICAL FUSION/FORAMINOTOMY CERVICAL SEVEN THORACIC-ONE  Patient Location: PACU  Anesthesia Type:General  Level of Consciousness: awake  Airway and Oxygen Therapy: Patient Spontanous Breathing  Post-op Pain: mild  Post-op Assessment: Post-op Vital signs reviewed  Post-op Vital Signs: Reviewed  Complications: No apparent anesthesia complications

## 2013-05-02 NOTE — H&P (Signed)
Subjective:   Patient is a 77 y.o. male admitted for C7T1 decompression and fusion. The patient first presented to me with complaints of neck pain and shooting pains in the arm(s). Onset of symptoms was several months ago. The pain is described as aching and stabbing and occurs all day. The pain is rated severe, and is located at the base of the neck and radiates to the arms. The symptoms have been progressive. Symptoms are exacerbated by extending head backwards, and are relieved by none.  Previous work up includes CT of cervical spine, results: disc bulge at C7-T1 bilateral.  Past Medical History  Diagnosis Date  . ASHD (arteriosclerotic heart disease)   . Diabetes mellitus   . ED (erectile dysfunction)   . Back pain, chronic   . GERD (gastroesophageal reflux disease)   . Hypertension   . Dyslipidemia   . Heart disease   . MI (myocardial infarction)   . CAD (coronary artery disease)   . AAA (abdominal aortic aneurysm) 08/30/12  . Blood transfusion without reported diagnosis     with heart or back surgery  . CHF (congestive heart failure)   . H/O hiatal hernia   . Arthritis     back, knees  . Diabetes mellitus   . PONV (postoperative nausea and vomiting)   . Irregular heart beat   . Pneumonia Feb 2014  . Shortness of breath     at baseline has SOB with ambulation  . Frequency of urination   . Bruises easily   . Sleep apnea     pt. states he was told to return for f/u, to be fitted for CPAp, but pt. reports that he didn't follow up    Past Surgical History  Procedure Laterality Date  . Heart attack  1985,1991    X2 [facility]ST[facility])  . Neck surgery      x 2  . Colonoscopy    . Coronary artery bypass graft  2000  . Hemorroidectomy    . Joint replacement      R knee replacement   . Knee arthroscopy      L knee 2 times   . Anterior lat lumbar fusion Left 11/22/2012    Procedure: ANTERIOR LATERAL LUMBAR FUSION 1 LEVEL;  Surgeon: Tia Alert, MD;  Location: [facility];  Service: Neurosurgery;  Laterality: Left;  Anterior Lateral Lumbar Fusion Lumbar Three-Four  . Lumbar percutaneous pedicle screw 1 level N/A 11/22/2012    Procedure: LUMBAR PERCUTANEOUS PEDICLE SCREW 1 LEVEL;  Surgeon: Tia Alert, MD;  Location: [facility] ORS;  Service: Neurosurgery;  Laterality: N/A;  Lumbar Three-Four Percutaneous Pedicle Screw, Lateral approach  . Coronary angioplasty with stent placement  2005    x 6 stents  . Back surgery  1979 & x 10    pt. remarks, "I have had about 10 back surgeries"    Allergies  Allergen Reactions  . Lisinopril Cough    Rxn: unknown  . Statins Other (See Comments)    Muscle ache  . Welchol (Colesevelam Hcl) Itching    History  Substance Use Topics  . Smoking status: Former Smoker    Types: Cigarettes    Quit date: 10/05/1983  . Smokeless tobacco: Never Used  . Alcohol Use: 1.8 oz/week    3 Cans of beer per week     Comment: socially-weekends    Family History  Problem Relation Age of Onset  . Arthritis Mother   . Diabetes Father   .  Heart disease Father   . Stroke Sister   . Hypertension Sister   . Heart disease Sister   . Diabetes Sister   . Breast cancer Sister   . Heart disease Brother   . Ulcers Brother   . Colon cancer Neg Hx    Prior to Admission medications   Medication Sig Start Date End Date Taking? Authorizing Provider  aspirin 81 MG tablet Take 81 mg by mouth daily.   Yes Historical Provider, MD  Choline Fenofibrate (FENOFIBRIC ACID) 135 MG CPDR Take by mouth daily.   Yes Historical Provider, MD  clopidogrel (PLAVIX) 75 MG tablet Take 75 mg by mouth daily.    Yes Historical Provider, MD  ferrous sulfate 325 (65 FE) MG tablet Take 325 mg by mouth daily with breakfast.   Yes Historical Provider, MD  losartan-hydrochlorothiazide (HYZAAR) 100-12.5 MG per tablet Take 1 tablet by mouth daily.   Yes Historical Provider, MD  Multiple Vitamins-Minerals (MULTIVITAMIN WITH MINERALS) tablet Take 1 tablet by mouth daily.     Yes Historical Provider, MD  oxyCODONE-acetaminophen (PERCOCET/ROXICET) 5-325 MG per tablet Take 1-2 tablets by mouth every 6 (six) hours as needed for pain.   Yes Historical Provider, MD  pioglitazone-metformin (ACTOPLUS MET) 15-500 MG per tablet Take 1 tablet by mouth daily.   Yes Historical Provider, MD  vardenafil (LEVITRA) 20 MG tablet Take 20 mg by mouth daily as needed. For erectile dysfunction   Yes Historical Provider, MD  Choline Fenofibrate (TRILIPIX) 135 MG capsule Take 1 capsule (135 mg total) by mouth daily. 05/01/13   Marykay Lex, MD  digoxin (LANOXIN) 0.125 MG tablet TAKE 1 TABLET DAILY 04/27/13   Marykay Lex, MD  metoprolol succinate (TOPROL-XL) 50 MG 24 hr tablet Take 1 tablet (50 mg total) by mouth daily before breakfast. 05/01/13   Marykay Lex, MD  pantoprazole (PROTONIX) 40 MG tablet Take 1 tablet (40 mg total) by mouth daily. 05/01/13   Marykay Lex, MD     Review of Systems  Positive ROS: neg  All other systems have been reviewed and were otherwise negative with the exception of those mentioned in the HPI and as above.  Objective: Vital signs in last 24 hours: Temp:  [98.1 F (36.7 C)] 98.1 F (36.7 C) (07/29 1411) Pulse Rate:  [71] 71 (07/29 1411) Resp:  [20] 20 (07/29 1411) BP: (134)/(79) 134/79 mmHg (07/29 1411) SpO2:  [95 %] 95 % (07/29 1411) Weight:  [98.476 kg (217 lb 1.6 oz)] 98.476 kg (217 lb 1.6 oz) (07/29 1411)  General Appearance: Alert, cooperative, no distress, appears stated age Head: Normocephalic, without obvious abnormality, atraumatic Eyes: PERRL, conjunctiva/corneas clear, EOM's intact, fundi benign, both eyes      Ears: Normal TM's and external ear canals, both ears Throat: Lips, mucosa, and tongue normal; teeth and gums normal Neck: Supple, symmetrical, trachea midline, no adenopathy; thyroid: No enlargement/tenderness/nodules; no carotid bruit or JVD Back: Symmetric, no curvature, ROM normal, no CVA tenderness Lungs: Clear to  auscultation bilaterally, respirations unlabored Heart: Regular rate and rhythm, S1 and S2 normal, no murmur, rub or gallop Abdomen: Soft, non-tender, bowel sounds active all four quadrants, no masses, no organomegaly Extremities: Extremities normal, atraumatic, no cyanosis or edema Pulses: 2+ and symmetric all extremities Skin: Skin color, texture, turgor normal, no rashes or lesions  NEUROLOGIC:  Mental status: Alert and oriented x4, no aphasia, good attention span, fund of knowledge and memory  Motor Exam - grossly normal Sensory Exam - grossly normal  Reflexes: 1+ Coordination - grossly normal Gait - grossly normal Balance - grossly normal Cranial Nerves: I: smell Not tested  II: visual acuity  OS: nl    OD: nl  II: visual fields Full to confrontation  II: pupils Equal, round, reactive to light  III,VII: ptosis None  III,IV,VI: extraocular muscles  Full ROM  V: mastication Normal  V: facial light touch sensation  Normal  V,VII: corneal reflex  Present  VII: facial muscle function - upper  Normal  VII: facial muscle function - lower Normal  VIII: hearing Not tested  IX: soft palate elevation  Normal  IX,X: gag reflex Present  XI: trapezius strength  5/5  XI: sternocleidomastoid strength 5/5  XI: neck flexion strength  5/5  XII: tongue strength  Normal    Data Review Lab Results  Component Value Date   WBC 6.0 05/01/2013   HGB 11.7* 05/01/2013   HCT 35.5* 05/01/2013   MCV 93.2 05/01/2013   PLT 195 05/01/2013   Lab Results  Component Value Date   NA 137 05/01/2013   K 4.6 05/01/2013   CL 104 05/01/2013   CO2 24 05/01/2013   BUN 21 05/01/2013   CREATININE 1.48* 05/01/2013   GLUCOSE 160* 05/01/2013   Lab Results  Component Value Date   INR 1.03 05/01/2013    Assessment:   Cervical neck pain with herniated nucleus pulposus/ spondylosis/ stenosis at C7-T1 with synovial cysts and subluxation . Patient has failed conservative therapy. Planned surgery : decompression and  fusion C7-T1  Plan:   I explained the condition and procedure to the patient and answered any questions.  Patient wishes to proceed with procedure as planned. Understands risks/ benefits/ and expected or typical outcomes.  Wood Novacek S 05/02/2013 8:37 AM

## 2013-05-02 NOTE — Transfer of Care (Signed)
Immediate Anesthesia Transfer of Care Note  Patient: [name]  Procedure(s) Performed: Procedure(s) with comments: POSTERIOR CERVICAL FUSION/FORAMINOTOMY CERVICAL SEVEN THORACIC-ONE (N/A) - POSTERIOR CERVICAL FUSION/FORAMINOTOMY CERVICAL SEVEN THORACIC-ONE  Patient Location: PACU  Anesthesia Type:General  Level of Consciousness: awake, alert , oriented and patient cooperative  Airway & Oxygen Therapy: Patient Spontanous Breathing and Patient connected to face mask oxygen  Post-op Assessment: Report given to PACU RN, Post -op Vital signs reviewed and stable and Patient moving all extremities  Post vital signs: Reviewed and stable  Complications: No apparent anesthesia complications

## 2013-05-02 NOTE — Op Note (Signed)
05/02/2013  1:43 PM  PATIENT:  [name]  77 y.o. male  PRE-OPERATIVE DIAGNOSIS:  Cervical spondylosis with stenosis C7-T1 with spondylolisthesis C7 on T1, synovial cyst formation, neck and arm pain  POST-OPERATIVE DIAGNOSIS:  Same  PROCEDURE:  1. Decompressive cervical laminectomy C7-T1 bilaterally with medial facetectomies and foraminotomies to decompress the central canal and exiting C8 nerve roots, 2. Posterior cervical arthrodesis C6-T1 utilizing local autograft, 3. Segmental fixation C6-T1 utilizing Nuvasive lateral mass screws  SURGEON:  Marikay Alar, MD  ASSISTANTS: Dr. Lovell Sheehan  ANESTHESIA:   General  EBL: 150 ml  Total I/O In: 1000 [I.V.:1000] Out: 150 [Blood:150]  BLOOD ADMINISTERED:none  DRAINS: Medium Hemovac   SPECIMEN:  No Specimen  INDICATION FOR PROCEDURE: This patient undergone previous ACDF at C4-5 C5-6 and C6-7. He presented with recurrent neck pain with bilateral arm pain. MRI showed synovial cyst at C7-T1 with canal stenosis and a subluxation of C7 on T1. He tried medical management without relief. Recommended a posterior cervical decompression and instrumented fusion. Patient understood the risks, benefits, and alternatives and potential outcomes and wished to proceed.  PROCEDURE DETAILS: The patient was brought to the operating room. Generalized endotracheal anesthesia was induced. The patient was affixed a 3 point [place] headrest and rolled into the prone position on chest rolls. All pressure points were padded. The posterior cervical region was cleaned and prepped with DuraPrep and then draped in the usual sterile fashion. 7 cc of local anesthesia was injected and a dorsal midline incision made in the posterior cervical region and carried down to the cervical fascia. The fascia was opened and the paraspinous musculature was taken down to expose C6-T1. Intraoperative fluoroscopy confirmed my level and then the dissection was carried out over the lateral  facets. I localized the midpoint of each lateral mass and marked a region 1 mm medial to the midpoint of the lateral mass, and then drilled in an upward and outward direction into the safe zone of each lateral mass. I drilled to a depth of 12 mm and then checked my drill hole with a ball probe. I then placed a 12 mm lateral mass screws into the safe zone of each lateral mass at C6 and C7 until they were 2 fingers tight. I removed the spinous process of C7 and I then gently decompressed the central canal with the 1 and 2 mm Kerrison punch at C7-T1 bilaterally. Medial facetectomies were performed, and foraminotomies were performed at C7-T1 to decompress the C8 nerve roots bilaterally. Once the decompression was complete the dura was full and capacious and I could see the spinal cord pulsatile through the dura. Palpate along the nerve roots with I nerve hook to assure adequate decompression of the C8 nerve roots. I then was able to palpate the T1 pedicle and identified the pedicle screw entry zones bilaterally. I then drilled into each pedicle to a depth of 18 mm and palpated with a ball probe. Then placed a 10 mm pedicle screws into the pedicles of T1 bilaterally.  I then decorticated the lateral masses and the facet joints and packed them with local autograft  to perform arthrodesis from C6-T1. I then placed rods into the multiaxial screw heads of the screws and locked these into position with the locking caps and anti-torque device. I then checked the final construct with AP/Lat fluoroscopy. I irrigated with saline solution containing bacitracin. I placed a medium Hemovac drain through separate stab incision, and lined the dura with Gelfoam. After hemostasis was  achieved I closed the muscle and the fascia with 0 Vicryl, subcutaneous tissue with 2-0 Vicryl, and the subcuticular tissue with 3-0 Vicryl. The skin was closed with benzoin and Steri-Strips. A sterile dressing was applied, the patient was turned to the  supine position and taken out of the headrest, awakened from general anesthesia and transferred to the recovery room in stable condition. At the end of the procedure all sponge, needle and instrument counts were correct.   PLAN OF CARE: Admit to inpatient   PATIENT DISPOSITION:  PACU - hemodynamically stable.   Delay start of Pharmacological VTE agent (>24hrs) due to surgical blood loss or risk of bleeding:  yes

## 2013-05-02 NOTE — Anesthesia Procedure Notes (Signed)
Procedure Name: Intubation Date/Time: 05/02/2013 11:24 AM Performed by: Coralee Rud Pre-anesthesia Checklist: Patient identified, Emergency Drugs available, Suction available, Patient being monitored and Timeout performed Patient Re-evaluated:Patient Re-evaluated prior to inductionOxygen Delivery Method: Circle system utilized Preoxygenation: Pre-oxygenation with 100% oxygen Ventilation: Mask ventilation without difficulty Laryngoscope Size: Miller and 3 Grade View: Grade I Tube type: Oral Tube size: 8.0 mm Number of attempts: 1 Airway Equipment and Method: Stylet,  Bite block and LTA kit utilized Placement Confirmation: ETT inserted through vocal cords under direct vision and positive ETCO2 Secured at: 23 cm Tube secured with: Tape Dental Injury: Teeth and Oropharynx as per pre-operative assessment

## 2013-05-03 LAB — GLUCOSE, CAPILLARY: Glucose-Capillary: 192 mg/dL — ABNORMAL HIGH (ref 70–99)

## 2013-05-03 MED ORDER — OXYCODONE-ACETAMINOPHEN 5-325 MG PO TABS: 1.0000 | ORAL_TABLET | Freq: Four times a day (QID) | ORAL | Status: DC | PRN

## 2013-05-03 NOTE — Discharge Summary (Signed)
Physician Discharge Summary  Patient ID: [name] MRN: [id number] DOB/AGE: Sep 29, 1936 77 y.o.  Admit date: 05/02/2013 Discharge date: 05/03/2013  Admission Diagnoses: Cervical stenosis    Discharge Diagnoses: same   Discharged Condition: good  Hospital Course: The patient was admitted on 05/02/2013 and taken to the operating room where the patient underwent Posterior cervical decompression and fusion. The patient tolerated the procedure well and was taken to the recovery room and then to the floor in stable condition. The hospital course was routine. There were no complications. The wound remained clean dry and intact. Pt had appropriate neck soreness. No complaints of arm pain or new N/T/W. The patient remained afebrile with stable vital signs, and tolerated a regular diet. The patient continued to increase activities, and pain was well controlled with oral pain medications.   Consults: None  Significant Diagnostic Studies:  Results for orders placed during the hospital encounter of 05/02/13  GLUCOSE, CAPILLARY      Result Value Range   Glucose-Capillary 137 (*) 70 - 99 mg/dL  GLUCOSE, CAPILLARY      Result Value Range   Glucose-Capillary 158 (*) 70 - 99 mg/dL  GLUCOSE, CAPILLARY      Result Value Range   Glucose-Capillary 187 (*) 70 - 99 mg/dL   Comment 1 Notify RN     Comment 2 Documented in Chart    GLUCOSE, CAPILLARY      Result Value Range   Glucose-Capillary 186 (*) 70 - 99 mg/dL  GLUCOSE, CAPILLARY      Result Value Range   Glucose-Capillary 192 (*) 70 - 99 mg/dL    Dg Cervical Spine 2-3 Views  05/02/2013   *RADIOLOGY REPORT*  Clinical Data: C7-T1 posterior fusion.  CERVICAL SPINE - 2-3 VIEW  Comparison: Radiographs dated 04/23/2013  Findings: Lateral C-arm image demonstrates the patient has undergone posterior fusion at C7-T1.  The detail on the lateral view is obscured by soft tissues of the shoulders.  IMPRESSION: Posterior fusion at C7-T1.   Original Report  Authenticated By: Francene Boyers, M.D.   Mr Cervical Spine Wo Contrast  04/17/2013   *RADIOLOGY REPORT*  Clinical Data: Neck and left arm pain.  Cervical fusion  MRI CERVICAL SPINE WITHOUT CONTRAST  Technique:  Multiplanar and multiecho pulse sequences of the cervical spine, to include the craniocervical junction and cervicothoracic junction, were obtained according to standard protocol without intravenous contrast.  Comparison: CT cervical 02/08/2011  Findings: Cervical fusion C4-C7.  Negative for fracture.  Spinal cord signal is normal.  Craniocervical junction is normal.  C2-3:  Small central disc protrusions  C3-4:  Small central disc protrusion ends mild spurring without significant spinal stenosis  C4-5:  ACDF with anterior plate and interbody fusion.  Negative for stenosis  C5-6:  Solid interbody fusion with posterior spurring and mild spinal stenosis  C6-7:  ACDF with anterior plate fusion.  Mild facet degeneration.  C7-T1:  3 mm anterior slip with marked facet degeneration.  Right- sided synovial cyst measures approximately 5 x 8 mm and projects into the spinal canal posteriorly.  There is some mass effect on the cord as well as right foraminal encroachment due to the synovial cyst.  In addition, there is foraminal encroachment bilaterally due to slip and facet degeneration.  IMPRESSION: Cervical fusion C4-C7.  3 mm anterior slip C7 on T1 with facet degeneration and foraminal encroachment bilaterally.  In addition, there is a moderately large synovial cyst on the right projecting into the canal and causing mild deformity  of the cord and contribute to right foraminal encroachment.   Original Report Authenticated By: Janeece Riggers, M.D.    Antibiotics:  Anti-infectives   Start     Dose/Rate Route Frequency Ordered Stop   05/02/13 2000  ceFAZolin (ANCEF) IVPB 1 g/50 mL premix     1 g 100 mL/hr over 30 Minutes Intravenous Every 8 hours 05/02/13 1553 05/03/13 0414   05/02/13 1100  bacitracin 50,000  Units in sodium chloride irrigation 0.9 % 500 mL irrigation  Status:  Discontinued       As needed 05/02/13 1111 05/02/13 1357   05/02/13 1038  bacitracin 14782 UNITS injection    Comments:  STEELMAN, CRAIG: cabinet override      05/02/13 1038 05/02/13 2244   05/02/13 0600  ceFAZolin (ANCEF) IVPB 2 g/50 mL premix     2 g 100 mL/hr over 30 Minutes Intravenous On call to O.R. 05/01/13 1427 05/02/13 1130      Discharge Exam: Blood pressure 147/90, pulse 56, temperature 97.7 F (36.5 C), temperature source Oral, resp. rate 18, SpO2 94.00%. Neurologic: Grossly normal Incision CDI  Discharge Medications:     Medication List         aspirin 81 MG tablet  Take 81 mg by mouth daily.     clopidogrel 75 MG tablet  Commonly known as:  PLAVIX  Take 75 mg by mouth daily.     digoxin 0.125 MG tablet  Commonly known as:  LANOXIN  TAKE 1 TABLET DAILY     Choline Fenofibrate 135 MG capsule  Commonly known as:  TRILIPIX  Take 1 capsule (135 mg total) by mouth daily.     Fenofibric Acid 135 MG Cpdr  Take by mouth daily.     ferrous sulfate 325 (65 FE) MG tablet  Take 325 mg by mouth daily with breakfast.     losartan-hydrochlorothiazide 100-12.5 MG per tablet  Commonly known as:  HYZAAR  Take 1 tablet by mouth daily.     metoprolol succinate 50 MG 24 hr tablet  Commonly known as:  TOPROL-XL  Take 1 tablet (50 mg total) by mouth daily before breakfast.     multivitamin with minerals tablet  Take 1 tablet by mouth daily.     oxyCODONE-acetaminophen 5-325 MG per tablet  Commonly known as:  PERCOCET/ROXICET  Take 1-2 tablets by mouth every 6 (six) hours as needed for pain.     pantoprazole 40 MG tablet  Commonly known as:  PROTONIX  Take 1 tablet (40 mg total) by mouth daily.     pioglitazone-metformin 15-500 MG per tablet  Commonly known as:  ACTOPLUS MET  Take 1 tablet by mouth daily.     vardenafil 20 MG tablet  Commonly known as:  LEVITRA  Take 20 mg by mouth daily as  needed. For erectile dysfunction        Disposition: home   Final Dx: PCF      Discharge Orders   Future Appointments Provider Department Dept Phone   08/17/2013 9:15 AM Ronnald Nian, MD [facility] [phone]   Future Orders Complete By Expires     Call MD for:  difficulty breathing, headache or visual disturbances  As directed     Call MD for:  persistant nausea and vomiting  As directed     Call MD for:  redness, tenderness, or signs of infection (pain, swelling, redness, odor or green/yellow discharge around incision site)  As directed     Call MD  for:  severe uncontrolled pain  As directed     Call MD for:  temperature >100.4  As directed     Diet - low sodium heart healthy  As directed     Discharge instructions  As directed     Comments:      No strenuous activity, no heavy lifting, may shower    Increase activity slowly  As directed        Follow-up Information   Follow up with Oneal Biglow S, MD. Schedule an appointment as soon as possible for a visit in 3 weeks.   Contact information:   [address] [place] [address] [phone]        Signed: Tia Alert 05/03/2013, 8:58 AM

## 2013-05-03 NOTE — Progress Notes (Signed)
Pt. Alert and oriented,follows simple instructions, denies pain. Incision area without swelling, redness or S/S of infection. Voiding adequate clear yellow urine. Moving all extremities well and vitals stable and documented. Posterior Cervical Fusion surgery notes instructions given to patient and family member for home safety and precautions. Pt. and family stated understanding of instructions given. Pain meds given per Pt.'s request for pain and discomfort of ride home.

## 2013-05-04 ENCOUNTER — Encounter (HOSPITAL_COMMUNITY): Payer: Self-pay | Admitting: Neurological Surgery

## 2013-05-06 ENCOUNTER — Other Ambulatory Visit: Payer: Self-pay | Admitting: Family Medicine

## 2013-06-04 ENCOUNTER — Other Ambulatory Visit: Payer: Self-pay | Admitting: Family Medicine

## 2013-06-11 DIAGNOSIS — M542 Cervicalgia: Secondary | ICD-10-CM | POA: Diagnosis not present

## 2013-06-11 DIAGNOSIS — M545 Low back pain: Secondary | ICD-10-CM | POA: Diagnosis not present

## 2013-06-13 ENCOUNTER — Encounter: Payer: Self-pay | Admitting: Cardiology

## 2013-06-14 ENCOUNTER — Ambulatory Visit (INDEPENDENT_AMBULATORY_CARE_PROVIDER_SITE_OTHER): Payer: Medicare Other | Admitting: Cardiology

## 2013-06-14 ENCOUNTER — Encounter: Payer: Self-pay | Admitting: Cardiology

## 2013-06-14 VITALS — BP 140/62 | HR 59 | Ht 69.0 in | Wt 214.5 lb

## 2013-06-14 DIAGNOSIS — Z9861 Coronary angioplasty status: Secondary | ICD-10-CM | POA: Diagnosis not present

## 2013-06-14 DIAGNOSIS — I509 Heart failure, unspecified: Secondary | ICD-10-CM

## 2013-06-14 DIAGNOSIS — I2119 ST elevation (STEMI) myocardial infarction involving other coronary artery of inferior wall: Secondary | ICD-10-CM | POA: Diagnosis not present

## 2013-06-14 DIAGNOSIS — Z79899 Other long term (current) drug therapy: Secondary | ICD-10-CM

## 2013-06-14 DIAGNOSIS — I2510 Atherosclerotic heart disease of native coronary artery without angina pectoris: Secondary | ICD-10-CM | POA: Diagnosis not present

## 2013-06-14 DIAGNOSIS — I714 Abdominal aortic aneurysm, without rupture: Secondary | ICD-10-CM

## 2013-06-14 DIAGNOSIS — I5032 Chronic diastolic (congestive) heart failure: Secondary | ICD-10-CM

## 2013-06-14 DIAGNOSIS — E785 Hyperlipidemia, unspecified: Secondary | ICD-10-CM

## 2013-06-14 DIAGNOSIS — E1159 Type 2 diabetes mellitus with other circulatory complications: Secondary | ICD-10-CM

## 2013-06-14 DIAGNOSIS — Z951 Presence of aortocoronary bypass graft: Secondary | ICD-10-CM

## 2013-06-14 DIAGNOSIS — E118 Type 2 diabetes mellitus with unspecified complications: Secondary | ICD-10-CM

## 2013-06-14 DIAGNOSIS — E1169 Type 2 diabetes mellitus with other specified complication: Secondary | ICD-10-CM

## 2013-06-14 DIAGNOSIS — E782 Mixed hyperlipidemia: Secondary | ICD-10-CM

## 2013-06-14 DIAGNOSIS — I10 Essential (primary) hypertension: Secondary | ICD-10-CM

## 2013-06-14 NOTE — Patient Instructions (Signed)
Have labs done CMP,LIPID Your physician wants you to follow-up in 6 MONTHS Dr Herbie Baltimore.  You will receive a reminder letter in the mail two months in advance. If you don't receive a letter, please call our office to schedule the follow-up appointment.

## 2013-06-18 DIAGNOSIS — E782 Mixed hyperlipidemia: Secondary | ICD-10-CM | POA: Diagnosis not present

## 2013-06-18 DIAGNOSIS — Z79899 Other long term (current) drug therapy: Secondary | ICD-10-CM | POA: Diagnosis not present

## 2013-06-18 LAB — COMPREHENSIVE METABOLIC PANEL
ALT: 19 U/L (ref 0–53)
AST: 19 U/L (ref 0–37)
Albumin: 3.9 g/dL (ref 3.5–5.2)
BUN: 22 mg/dL (ref 6–23)
Calcium: 9.5 mg/dL (ref 8.4–10.5)
Chloride: 107 mEq/L (ref 96–112)
Potassium: 4.6 mEq/L (ref 3.5–5.3)

## 2013-06-20 ENCOUNTER — Telehealth: Payer: Self-pay | Admitting: *Deleted

## 2013-06-20 DIAGNOSIS — E785 Hyperlipidemia, unspecified: Secondary | ICD-10-CM

## 2013-06-20 DIAGNOSIS — E782 Mixed hyperlipidemia: Secondary | ICD-10-CM

## 2013-06-20 DIAGNOSIS — Z79899 Other long term (current) drug therapy: Secondary | ICD-10-CM

## 2013-06-20 MED ORDER — EZETIMIBE 10 MG PO TABS: 10.0000 mg | ORAL_TABLET | Freq: Every day | ORAL | Status: DC

## 2013-06-20 NOTE — Telephone Encounter (Signed)
Medication e-scribed.

## 2013-06-20 NOTE — Telephone Encounter (Signed)
Message copied by Tobin Chad on Wed Jun 20, 2013 11:58 AM ------      Message from: Herbie Baltimore, DAVID      Created: Mon Jun 18, 2013 11:09 PM       Cholesterol is not where we want it -- total cholesterol is up & so are triglycerides --> without being able to use statins, he really needs to monitor his diet & increase exercise.      Triliptix is not sufficient alone -- I would like to add Zetia 10 mg daily.      Would then recheck labs  in 3-4 months.            Marykay Lex, MD       ------

## 2013-06-20 NOTE — Telephone Encounter (Signed)
Spoke to patient . Results given  CMP,LIPIDS. Zetia escribed to pharmacy. 90 supply, a labslip will be mailed in 3-4 months. Pt aware.

## 2013-06-20 NOTE — Telephone Encounter (Signed)
Message copied by Tobin Chad on Wed Jun 20, 2013 11:37 AM ------      Message from: Herbie Baltimore, DAVID      Created: Mon Jun 18, 2013 11:09 PM       Cholesterol is not where we want it -- total cholesterol is up & so are triglycerides --> without being able to use statins, he really needs to monitor his diet & increase exercise.      Triliptix is not sufficient alone -- I would like to add Zetia 10 mg daily.      Would then recheck labs  in 3-4 months.            Marykay Lex, MD       ------

## 2013-06-28 DIAGNOSIS — E669 Obesity, unspecified: Secondary | ICD-10-CM | POA: Insufficient documentation

## 2013-06-28 DIAGNOSIS — Z79899 Other long term (current) drug therapy: Secondary | ICD-10-CM | POA: Insufficient documentation

## 2013-06-28 NOTE — Assessment & Plan Note (Signed)
With patent LIMA-LAD, would be that we could postpone any potential for needing redo CABG as long as possible.

## 2013-06-28 NOTE — Assessment & Plan Note (Addendum)
Relatively well controlled on current regimen with beta blocker and Hyzaar

## 2013-06-28 NOTE — Assessment & Plan Note (Signed)
He was just started on fenofibrate. His PCP is monitoring this. I'm hoping that is able to get back into exercise this would control. He's not sure when he will get his labs:Marland Kitchen I would like to see how he's doing. 12-lead order lipid panel and chemistries for record-keeping and potential management.

## 2013-06-28 NOTE — Assessment & Plan Note (Signed)
Following the panel and LFTs for Zetia and fenofibrate.

## 2013-06-28 NOTE — Assessment & Plan Note (Signed)
Continue to monitor. Will likely need a followup Doppler after next visit.

## 2013-06-28 NOTE — Assessment & Plan Note (Addendum)
Sure if his chronic dyspnea is due to diastolic heart failure. His pulmonary pressures are not estimated is being elevated by his recent echo, and also there was no clear evaluation of his diastolic function. Did not appear to have significant LVH. Certainly his mild obesity has something to do with this as well.  With a negative Myoview now on 2 cardiac catheterizations since 2008, in the absence of anginal pain, I'm reluctant to resume that this could be related to additional coronary disease. I also don't believe that he could be constrictive pericarditis or restrictive cardiomyopathy.  Ensure adequate blood pressure control. He is not even on furosemide, only using the HCTZ component of Hyzaar for diuresis. He is also on digoxin for additional rate control.

## 2013-06-28 NOTE — Progress Notes (Signed)
PCP: Carollee Herter, MD  Clinic Note: Chief Complaint  Patient presents with  . 6 months visit    no chest pain , sob for years, swell a little bit if on them all day   HPI: [name] is a 77 y.o. male with a PMH below who presents today for six-month followup. He was a long-standing patient of Dr. Caprice Kluver with a stable abdominal aortic aneurysm, and a cardiac history dating back to 1995 when he had a acute lateral STEMI treated with PTCA of the occluded circumflex marginal.  That began along stage of angina advanced which resulted in 2 vessel CABG as well as PCI of the native RCA and circumflex in the vein graft to the circumflex was occluded (see below). His other cardiac risk factors include hypertension, dyslipidemia, diabetes on oral medications.   I last saw him after his neck/back surgery back in March. He is doing relatively well at that time.  Interval History: Today he presents for six-month visit and is doing relatively well. He still notes that he gets dyspneic on exertion with going up a hill or going up stairs. Nothing with walking on flat ground. This has been going on for years. A lot of what his trouble with his his hips and back pain and that makes him feel more short of breath. He says will he ever since his surgery he continues to have problems with it. He tries to walk some, and is happy Reola Calkins at home. He's starting to get back into his clonic activities such as fishing. He has been out mowing the lawn and doing other yard activities without problems.  Besides his baseline dyspnea which has been much chronic for him, he denies any chest pain with rest or exertion. He denies any heart failure symptoms of PND, orthopnea, or edema. The remainder of Cardiovascular ROS is as follows : negative for - irregular heartbeat, loss of consciousness, murmur, orthopnea, palpitations, paroxysmal nocturnal dyspnea or rapid heart rate  Additional cardiac review of  systems: Lightheadedness - no, dizziness - no, syncope/near-syncope - no; TIA/amaurosis fugax - no Melena - no, hematochezia no; hematuria - no; nosebleeds - no; claudication - no  Patient Active Problem List   Diagnosis Date Noted  . S/P CABG x 2: 1997. SVG-OM (known to be occluded), LIMA-LAD 06/28/1996    Priority: High  . History of ST elevation myocardial infarction (STEMI) of inferolateral wall (PTCA - 100% large lateral OM) 06/28/1996    Priority: High    Class: History of  . CAD S/P percutaneous coronary angioplasty - 3 DES stents to RCA, one in circumflex-OM 06/28/1994    Priority: High    Class: History of  . AAA (abdominal aortic aneurysm) without rupture 04/16/2013    Priority: Medium  . Chronic diastolic CHF (congestive heart failure) 11/24/2012    Priority: Medium  . Type II diabetes mellitus with complication -- CAD, AAA 03/04/2011    Priority: Medium    Class: Diagnosis of  . Hypertension associated with diabetes 03/04/2011    Priority: Medium  . Hyperlipidemia LDL goal <70; statin intolerant 03/04/2011    Priority: Medium    Class: Diagnosis of  . Obesity (BMI 30-39.9) 06/28/2013    Priority: Low  . Encounter for long-term (current) use of other medications 06/28/2013  . ACE-inhibitor cough 04/16/2013  . ED (erectile dysfunction) of organic origin 03/04/2011  . GERD (gastroesophageal reflux disease) 03/04/2011   Prior Cardiac Evaluation/ History:  PTCA circumflex  1995 for inferolateral STEMI  CABGx2 in 1997: LIMA - LAD, SVG - OM. (SVG-OM known occluded)  September 2004, unstable angina: Circumflex-OM: Taxus DES 3.0 mm 20 mm (3.2 mm,  99% jailing of a reducer); mid RCA Taxus DES 2.75 mm 32 mm with proximal 2.75 mm x 12 mm (post total 3.0 mm in mid section)  August 2007: Proximal RCA stent ISR: Unstable angina --> Cypher DES 3.0 mm 13 mm (3.1 mm)  Cardiac cath March 08: Stable disease widely patent stents and LIMA-LAD with 95% apical LAD, and 99% follow-on AV  groove ostium of from stented Circumflex-OM  March 2010 Myoview showing apical thinning and probable diaphragmatic attenuation; Negative for ischemia infarction, but persistent symptoms  Cath: Stable from 2008 -- 100% occluded LAD after SP1, distal LAD grafted via LIMA, 95% apical lesion. Circumflex - Large OM1 with patent stent in the proximal portion. Ostial is 70 to 80% continuation of  AVG Cx  beyond this non-amenable PCI. RCA widely patent long stented segment  in the mid portion with less than 40% stenosis, vein graft to the OM known to be occluded.   Myoview December 2013: Preop back surgery --> moderate region of mid to basal inferior inferolateral scar without ischemia. Mild apical hypokinesis with an EF of 47%. This would go along with the OM graft 100% closed.   ECHO December 2013: LV size, thickness and function EF 55-60%. Wall motion difficult to assess. As was diastolic pressures. Moderate LA dilation  Allergies  Allergen Reactions  . Lisinopril Cough    Rxn: unknown  . Statins Other (See Comments)    Muscle ache  . Welchol [Colesevelam Hcl] Itching    Current Outpatient Prescriptions  Medication Sig Dispense Refill  . aspirin 81 MG tablet Take 81 mg by mouth daily.      . Choline Fenofibrate (TRILIPIX) 135 MG capsule Take 1 capsule (135 mg total) by mouth daily.  90 capsule  2  . clopidogrel (PLAVIX) 75 MG tablet Take 75 mg by mouth daily.       . digoxin (LANOXIN) 0.125 MG tablet TAKE 1 TABLET DAILY  90 tablet  2  . ferrous sulfate 325 (65 FE) MG tablet Take 325 mg by mouth daily with breakfast.      . losartan-hydrochlorothiazide (HYZAAR) 100-12.5 MG per tablet Take 1 tablet by mouth daily.      . methocarbamol (ROBAXIN) 750 MG tablet Take 750 mg by mouth as needed.      . metoprolol succinate (TOPROL-XL) 50 MG 24 hr tablet Take 1 tablet (50 mg total) by mouth daily before breakfast.  90 tablet  2  . Multiple Vitamins-Minerals (MULTIVITAMIN WITH MINERALS) tablet Take 1  tablet by mouth daily.       . nortriptyline (PAMELOR) 25 MG capsule TAKE 1 CAPSULE (25 MG TOTAL) BY MOUTH AT BEDTIME.  30 capsule  1  . oxyCODONE-acetaminophen (PERCOCET/ROXICET) 5-325 MG per tablet Take 1-2 tablets by mouth every 6 (six) hours as needed for pain.  30 tablet  0  . pantoprazole (PROTONIX) 40 MG tablet Take 1 tablet (40 mg total) by mouth daily.  90 tablet  2  . pioglitazone-metformin (ACTOPLUS MET) 15-500 MG per tablet TAKE 1 TABLET BY MOUTH DAILY.  90 tablet  1  . ezetimibe (ZETIA) 10 MG tablet Take 1 tablet (10 mg total) by mouth daily.  90 tablet  3   No current facility-administered medications for this visit.    History   Social History Narrative  .  No narrative on file   ROS: A comprehensive Review of Systems - Negative except Pertinent as noted above in history of present illness as well as below. Respiratory ROS: positive for - shortness of breath Musculoskeletal ROS: Bilateral hip and low back discomfort   PHYSICAL EXAM BP 140/62  Pulse 59  Ht 5\' 9"  (1.753 m)  Wt 214 lb 8 oz (97.297 kg)  BMI 31.66 kg/m2 General appearance: alert, cooperative, appears stated age, no distress and Pleasant mood and affect, answers questions appropriately. Neck: no adenopathy, no carotid bruit, no JVD and supple, symmetrical, trachea midline Lungs: clear to auscultation bilaterally, normal percussion bilaterally and Good air movement, nonlabored, no W./ R./R. Heart: normal apical impulse, regular rate and rhythm, S1, S2 normal and 1-2/6 SEM at value S/P without radiation. No R./G. Abdomen: soft, non-tender; bowel sounds normal; no masses,  no organomegaly and No pulsatile mass or bruit Extremities: extremities normal, atraumatic, no cyanosis or edema and no edema, redness or tenderness in the calves or thighs Pulses: 2+ and symmetric Neurologic: Grossly normal HEENT: [place]/AT, EOMI, MMM, anicteric sclera  [id number] today: Yes Rate: 59  , Rhythm:  sinus bradycardia, otherwise  normal ;    Recent Labs:  Last set of cholesterol admission October 2013: TC 224, HDL 34, LDL 156, TG 172  ASSESSMENT / PLAN: CAD S/P percutaneous coronary angioplasty - 3 DES stents to RCA, one in circumflex-OM Relatively stable symptom-wise. Notable change from last visit. His baseline dyspnea is there. But no nasal symptoms. Last stress test was this past December and negative. Plan: Continue aspirinToprol/Plavix with Protonix for GI prophylaxis, Toprol plus losartan/HCTZ.  Not on statin due to intolerance.   Chronic diastolic CHF (congestive heart failure) Sure if his chronic dyspnea is due to diastolic heart failure. His pulmonary pressures are not estimated is being elevated by his recent echo, and also there was no clear evaluation of his diastolic function. Did not appear to have significant LVH. Certainly his mild obesity has something to do with this as well.  With a negative Myoview now on 2 cardiac catheterizations since 2008, in the absence of anginal pain, I'm reluctant to resume that this could be related to additional coronary disease. I also don't believe that he could be constrictive pericarditis or restrictive cardiomyopathy.  Ensure adequate blood pressure control. He is not even on furosemide, only using the HCTZ component of Hyzaar for diuresis. He is also on digoxin for additional rate control.  Hypertension associated with diabetes Relatively well controlled on current regimen with beta blocker and Hyzaar  AAA (abdominal aortic aneurysm) without rupture Continue to monitor. Will likely need a followup Doppler after next visit.  Hyperlipidemia LDL goal <70; statin intolerant He was just started on fenofibrate. His PCP is monitoring this. I'm hoping that is able to get back into exercise this would control. He's not sure when he will get his labs:Marland Kitchen I would like to see how he's doing. 12-lead order lipid panel and chemistries for record-keeping and potential  management.  S/P CABG x 2: 1997. SVG-OM (known to be occluded), LIMA-LAD With patent LIMA-LAD, would be that we could postpone any potential for needing redo CABG as long as possible.  Encounter for long-term (current) use of other medications Following the panel and LFTs for Zetia and fenofibrate.    Orders Placed This Encounter  Procedures  . Comprehensive metabolic panel    Order Specific Question:  Has the patient fasted?    Answer:  Yes  . Lipid  panel    Order Specific Question:  Has the patient fasted?    Answer:  Yes  . EKG 12-Lead   Meds ordered this encounter  Medications  . methocarbamol (ROBAXIN) 750 MG tablet    Sig: Take 750 mg by mouth as needed.    Followup: 6 months  DAVID W. Herbie Baltimore, M.D., M.S. [facility] [address] [place], [place]  [address]  [phone] Pager # [phone]

## 2013-06-28 NOTE — Assessment & Plan Note (Addendum)
Relatively stable symptom-wise. Notable change from last visit. His baseline dyspnea is there. But no nasal symptoms. Last stress test was this past December and negative. Plan: Continue aspirinToprol/Plavix with Protonix for GI prophylaxis, Toprol plus losartan/HCTZ.  Not on statin due to intolerance.

## 2013-08-09 ENCOUNTER — Telehealth: Payer: Self-pay | Admitting: *Deleted

## 2013-08-09 DIAGNOSIS — E785 Hyperlipidemia, unspecified: Secondary | ICD-10-CM

## 2013-08-09 DIAGNOSIS — Z79899 Other long term (current) drug therapy: Secondary | ICD-10-CM

## 2013-08-09 DIAGNOSIS — E782 Mixed hyperlipidemia: Secondary | ICD-10-CM

## 2013-08-09 NOTE — Telephone Encounter (Signed)
Letter mailed with labslip to patient

## 2013-08-11 ENCOUNTER — Other Ambulatory Visit: Payer: Self-pay | Admitting: Cardiology

## 2013-08-13 DIAGNOSIS — M542 Cervicalgia: Secondary | ICD-10-CM | POA: Diagnosis not present

## 2013-08-13 NOTE — Telephone Encounter (Signed)
Rx was sent to pharmacy electronically. 

## 2013-08-17 ENCOUNTER — Encounter: Payer: Self-pay | Admitting: Family Medicine

## 2013-08-17 ENCOUNTER — Ambulatory Visit (INDEPENDENT_AMBULATORY_CARE_PROVIDER_SITE_OTHER): Payer: Medicare Other | Admitting: Family Medicine

## 2013-08-17 VITALS — BP 140/90 | HR 71 | Wt 215.0 lb

## 2013-08-17 DIAGNOSIS — E118 Type 2 diabetes mellitus with unspecified complications: Secondary | ICD-10-CM | POA: Diagnosis not present

## 2013-08-17 DIAGNOSIS — Z23 Encounter for immunization: Secondary | ICD-10-CM

## 2013-08-17 DIAGNOSIS — E1169 Type 2 diabetes mellitus with other specified complication: Secondary | ICD-10-CM

## 2013-08-17 DIAGNOSIS — Z79899 Other long term (current) drug therapy: Secondary | ICD-10-CM | POA: Diagnosis not present

## 2013-08-17 DIAGNOSIS — E1139 Type 2 diabetes mellitus with other diabetic ophthalmic complication: Secondary | ICD-10-CM | POA: Diagnosis not present

## 2013-08-17 DIAGNOSIS — I10 Essential (primary) hypertension: Secondary | ICD-10-CM

## 2013-08-17 DIAGNOSIS — Z951 Presence of aortocoronary bypass graft: Secondary | ICD-10-CM | POA: Diagnosis not present

## 2013-08-17 DIAGNOSIS — I152 Hypertension secondary to endocrine disorders: Secondary | ICD-10-CM

## 2013-08-17 DIAGNOSIS — E669 Obesity, unspecified: Secondary | ICD-10-CM

## 2013-08-17 DIAGNOSIS — E1159 Type 2 diabetes mellitus with other circulatory complications: Secondary | ICD-10-CM

## 2013-08-17 DIAGNOSIS — E785 Hyperlipidemia, unspecified: Secondary | ICD-10-CM | POA: Diagnosis not present

## 2013-08-17 DIAGNOSIS — E1136 Type 2 diabetes mellitus with diabetic cataract: Secondary | ICD-10-CM

## 2013-08-17 LAB — COMPREHENSIVE METABOLIC PANEL
ALT: 23 U/L (ref 0–53)
CO2: 26 mEq/L (ref 19–32)
Calcium: 9.5 mg/dL (ref 8.4–10.5)
Chloride: 104 mEq/L (ref 96–112)
Sodium: 139 mEq/L (ref 135–145)
Total Protein: 6.6 g/dL (ref 6.0–8.3)

## 2013-08-17 LAB — POCT GLYCOSYLATED HEMOGLOBIN (HGB A1C): Hemoglobin A1C: 6.9

## 2013-08-17 LAB — LIPID PANEL
Cholesterol: 195 mg/dL (ref 0–200)
VLDL: 28 mg/dL (ref 0–40)

## 2013-08-17 NOTE — Progress Notes (Signed)
Subjective:    [name] is a 77 y.o. male who presents for follow-up of Type 2 diabetes mellitus.  He was recently seen by cardiology. He was placed on Z. he had and is also on fenofibrate. He is having no difficulty with that. He did have neck surgery done in July and is doing quite well. He is having no pain with that. He was given a prescription of Robaxin to help with his chronic back pain and spasm.  Home blood sugar records: 130  Current symptoms/problems none Daily foot checks:  Any foot concerns: yes/tingle all the time Last eye exam:  hecker 02/02/13   Medication compliance:good Current diet: none Current exercise: not much per patient Known diabetic complications: peripheral neuropathy, cardiovascular disease and peripheral vascular disease Cardiovascular risk factors: advanced age (older than 34 for men, 46 for women), diabetes mellitus, dyslipidemia, hypertension, male gender and sedentary lifestyle   The following portions of the patient's history were reviewed and updated as appropriate: allergies, current medications, past family history, past social history, past surgical history and problem list.  ROS as in subjective above    Objective:    General appearence: alert, no distress, WD/WN Ext: no edema Foot exam:  Neuro: foot monofilament exam normal also is normal   Lab Review Lab Results  Component Value Date   HGBA1C 7.2 04/16/2013   Lab Results  Component Value Date   CHOL 238* 06/18/2013   HDL 35* 06/18/2013   LDLCALC 149* 06/18/2013   TRIG 271* 06/18/2013   CHOLHDL 6.8 06/18/2013   No results found for this basenameConcepcion Elk     Chemistry      Component Value Date/Time   NA 138 06/18/2013 1046   K 4.6 06/18/2013 1046   CL 107 06/18/2013 1046   CO2 26 06/18/2013 1046   BUN 22 06/18/2013 1046   CREATININE 1.27 06/18/2013 1046   CREATININE 1.48* 05/01/2013 1442      Component Value Date/Time   CALCIUM 9.5 06/18/2013 1046   ALKPHOS 27*  06/18/2013 1046   AST 19 06/18/2013 1046   ALT 19 06/18/2013 1046   BILITOT 0.4 06/18/2013 1046        Chemistry      Component Value Date/Time   NA 138 06/18/2013 1046   K 4.6 06/18/2013 1046   CL 107 06/18/2013 1046   CO2 26 06/18/2013 1046   BUN 22 06/18/2013 1046   CREATININE 1.27 06/18/2013 1046   CREATININE 1.48* 05/01/2013 1442      Component Value Date/Time   CALCIUM 9.5 06/18/2013 1046   ALKPHOS 27* 06/18/2013 1046   AST 19 06/18/2013 1046   ALT 19 06/18/2013 1046   BILITOT 0.4 06/18/2013 1046     Hemoglobin A1c 6.9   Assessment:   Encounter Diagnoses  Name Primary?  . Type II diabetes mellitus with complication -- CAD, AAA Yes  . Need for prophylactic vaccination and inoculation against influenza   . Cataract associated with type 2 diabetes mellitus   . Hyperlipidemia LDL goal <70; statin intolerant   . S/P CABG x 2: 1997. SVG-OM (known to be occluded), LIMA-LAD   . Obesity (BMI 30-39.9)   . Hypertension associated with diabetes   . Encounter for long-term (current) use of other medications   . Type II or unspecified type diabetes mellitus with other specified manifestations, not stated as uncontrolled          Plan:    1.  Rx changes: none 2.  Education: Reviewed 'ABCs' of diabetes management (respective goals in parentheses):  A1C (<7), blood pressure (<130/80), and cholesterol (LDL <100). 3.  Compliance at present is estimated to be good. Efforts to improve compliance (if necessary) will be directed at No change. 4. Follow up: 4 months

## 2013-08-20 NOTE — Progress Notes (Signed)
Quick Note:  MAILED PT LETTER OF LAB RESULTS ______ 

## 2013-10-01 ENCOUNTER — Other Ambulatory Visit (HOSPITAL_COMMUNITY): Payer: Self-pay | Admitting: Cardiology

## 2013-10-01 DIAGNOSIS — I714 Abdominal aortic aneurysm, without rupture: Secondary | ICD-10-CM

## 2013-10-10 ENCOUNTER — Ambulatory Visit (HOSPITAL_COMMUNITY)
Admission: RE | Admit: 2013-10-10 | Discharge: 2013-10-10 | Disposition: A | Discharge status: Home / Self Care | Payer: Medicare Other | Source: Ambulatory Visit | Attending: Internal Medicine | Admitting: Internal Medicine

## 2013-10-10 DIAGNOSIS — I714 Abdominal aortic aneurysm, without rupture, unspecified: Secondary | ICD-10-CM | POA: Diagnosis not present

## 2013-10-10 NOTE — Progress Notes (Signed)
Aorta Duplex Completed. [name], BS, RDMS, RVT  

## 2013-10-17 ENCOUNTER — Other Ambulatory Visit: Payer: Self-pay | Admitting: Family Medicine

## 2013-10-19 ENCOUNTER — Telehealth: Payer: Self-pay | Admitting: *Deleted

## 2013-10-19 NOTE — Telephone Encounter (Signed)
Spoke to patient. Result given . Verbalized understanding Per Dr Ellyn Hack okay to do q 74month survell.

## 2013-10-19 NOTE — Telephone Encounter (Signed)
Message copied by Raiford Simmonds on Fri Oct 19, 2013 12:57 PM ------      Message from: Ellyn Hack, DAVID W      Created: Wed Oct 17, 2013  2:09 AM       Essentially stable sized AAA.       Continue annual evaluation.            HARDING,DAVID W       ------

## 2013-10-23 DIAGNOSIS — H35379 Puckering of macula, unspecified eye: Secondary | ICD-10-CM | POA: Diagnosis not present

## 2013-10-23 DIAGNOSIS — E119 Type 2 diabetes mellitus without complications: Secondary | ICD-10-CM | POA: Diagnosis not present

## 2013-10-23 DIAGNOSIS — H40019 Open angle with borderline findings, low risk, unspecified eye: Secondary | ICD-10-CM | POA: Diagnosis not present

## 2013-10-23 DIAGNOSIS — H353190 Nonexudative age-related macular degeneration, unspecified eye, stage unspecified: Secondary | ICD-10-CM | POA: Diagnosis not present

## 2013-10-23 DIAGNOSIS — H179 Unspecified corneal scar and opacity: Secondary | ICD-10-CM | POA: Diagnosis not present

## 2013-10-23 DIAGNOSIS — H2510 Age-related nuclear cataract, unspecified eye: Secondary | ICD-10-CM | POA: Diagnosis not present

## 2013-10-23 DIAGNOSIS — H43399 Other vitreous opacities, unspecified eye: Secondary | ICD-10-CM | POA: Diagnosis not present

## 2013-11-15 ENCOUNTER — Telehealth: Payer: Self-pay | Admitting: Family Medicine

## 2013-11-15 MED ORDER — PIOGLITAZONE HCL-METFORMIN HCL 15-500 MG PO TABS: ORAL_TABLET | ORAL | Status: DC

## 2013-11-15 NOTE — Telephone Encounter (Signed)
ACTOplus met renewed

## 2013-11-26 ENCOUNTER — Telehealth: Payer: Self-pay | Admitting: *Deleted

## 2013-11-26 MED ORDER — CLOPIDOGREL BISULFATE 75 MG PO TABS: 75.0000 mg | ORAL_TABLET | Freq: Every day | ORAL | Status: DC

## 2013-11-26 NOTE — Telephone Encounter (Signed)
Returned call and pt verified x 2 w/ Earlie Server, pt's wife.  Informed message received.  Wife stated they use [facility] [address].  Informed refill will be sent for 90-day supply as requested.  Verbalized understanding.  Refill(s) sent to pharmacy: [facility].

## 2013-11-26 NOTE — Telephone Encounter (Signed)
Pt's wife called stating that they changed pharmacies and he now needs refills and the pharmacy needs a new Rx. Plavix 75 mg.  [place]

## 2013-11-26 NOTE — Telephone Encounter (Signed)
Wife will also call back w/ other meds that need to be sent to local pharmacy.

## 2013-12-06 ENCOUNTER — Encounter: Payer: Self-pay | Admitting: Cardiology

## 2013-12-06 ENCOUNTER — Ambulatory Visit (INDEPENDENT_AMBULATORY_CARE_PROVIDER_SITE_OTHER): Payer: Medicare Other | Admitting: Cardiology

## 2013-12-06 VITALS — BP 122/78 | HR 71 | Ht 69.0 in | Wt 218.9 lb

## 2013-12-06 DIAGNOSIS — E1169 Type 2 diabetes mellitus with other specified complication: Secondary | ICD-10-CM

## 2013-12-06 DIAGNOSIS — I2119 ST elevation (STEMI) myocardial infarction involving other coronary artery of inferior wall: Secondary | ICD-10-CM

## 2013-12-06 DIAGNOSIS — Z9861 Coronary angioplasty status: Secondary | ICD-10-CM

## 2013-12-06 DIAGNOSIS — I5032 Chronic diastolic (congestive) heart failure: Secondary | ICD-10-CM | POA: Diagnosis not present

## 2013-12-06 DIAGNOSIS — I152 Hypertension secondary to endocrine disorders: Secondary | ICD-10-CM

## 2013-12-06 DIAGNOSIS — I2510 Atherosclerotic heart disease of native coronary artery without angina pectoris: Secondary | ICD-10-CM | POA: Diagnosis not present

## 2013-12-06 DIAGNOSIS — I714 Abdominal aortic aneurysm, without rupture, unspecified: Secondary | ICD-10-CM | POA: Diagnosis not present

## 2013-12-06 DIAGNOSIS — E785 Hyperlipidemia, unspecified: Secondary | ICD-10-CM

## 2013-12-06 DIAGNOSIS — R0602 Shortness of breath: Secondary | ICD-10-CM

## 2013-12-06 DIAGNOSIS — E1159 Type 2 diabetes mellitus with other circulatory complications: Secondary | ICD-10-CM

## 2013-12-06 DIAGNOSIS — I509 Heart failure, unspecified: Secondary | ICD-10-CM

## 2013-12-06 DIAGNOSIS — I10 Essential (primary) hypertension: Secondary | ICD-10-CM

## 2013-12-06 MED ORDER — LOSARTAN POTASSIUM-HCTZ 100-12.5 MG PO TABS: ORAL_TABLET | ORAL | Status: DC

## 2013-12-06 NOTE — Patient Instructions (Signed)
Your physician has requested that you have an abdominal aorta duplex. During this test, an ultrasound is used to evaluate the aorta. Allow 30 minutes for this exam. Do not eat after midnight the day before and avoid carbonated beverages In July 2015  Your physician wants you to follow-up in 6 month Dr harding.  You will receive a reminder letter in the mail two months in advance. If you don't receive a letter, please call our office to schedule the follow-up appointment.

## 2013-12-08 ENCOUNTER — Encounter: Payer: Self-pay | Admitting: Cardiology

## 2013-12-08 NOTE — Progress Notes (Signed)
PCP: [name], MD  Clinic Note: Chief Complaint  Patient presents with  . 6 months visit    no chest pain , sob ,  edema   HPI: [name] is a 78 y.o. male with a Cardiovascular Problem List below who presents today for six-month followup of CAD -see extensive history below. He remains quite debilitated due to his back, knee and hip pains. Unfortunately he's not been able to exercise as a result of his extensive arthritic problems. He is also statin intolerance which makes treating his lipids very difficult.  Interval History: [name] presents today doing relatively well. Again he still has a stable baseline exertional dyspnea either going up stairs or walk uphill. A lot of this is related to his significant osteoarthritis pains. That usually seems to bother him before he gets short of breath. He denies any exertional chest tightness or pressure. He has difficulty lying down flat because of his back but denies any PND, orthopnea. He does have intermittent lower extremity edema but is relatively stable from that standpoint.  Cardiovascular ROS: positive for - dyspnea on exertion negative for - chest pain, irregular heartbeat, loss of consciousness, orthopnea, palpitations, paroxysmal nocturnal dyspnea, rapid heart rate or shortness of breath, lightheadedness/dizziness, syncope/near [place], TIA/amaurosis fugax, melena, hematochezia, hematuria, nosebleeds, claudication. He states he had his blood work recently done by his PCP in January. It will be rechecked in roughly May time frame.  Past Medical History  Diagnosis Date  . History of: ST elevation myocardial infarction (STEMI) involving left circumflex coronary artery with complication [id number]    PTCA-circumflex; PCI in 1991  . CAD, multiple vessel 1985-2010    Most recent cath 01/01/09: 100% Occluded LAD after SP1, patent LIMA-distal LAD with apical 90% lesion.  Cx-OM1 widely patent stent into proximal bifurcating OM1 .  Follow-on Cx => 70-80% -- non-amenable PCI. Widely patent 3 overlapping stents in midRCA with only 40% RPL stenosis; SVG- OM known to be occluded.  . S/P CABG x 2 1997    LIMA-LAD, SVG-OM  . H/O unstable angina 06/2003, 12/2006,    '04: Staged Taxus DES PCI to RCA and circumflex-OM;;'08 - PCI to proximal ISR in RCA - Cypher DES  . History of nuclear stress test December 2013    LOW at Risk. Moderate region of mid to basal inferolateral scar without ischemia -- consistent with distal circumflex disease. Mild apical hypokinesis with an EF of 47%.  . Irregular heart beat     PVCs and PACs, no arrhythmia recorded  . Exertional dyspnea     Chronic baseline SOB with ambulation  . Sleep apnea     pt. states he was told to return for f/u, to be fitted for CPAp, but pt. reports that he didn't follow up  . AAA (abdominal aortic aneurysm) 08/30/12    Doppler 08/30/12 had a small amount of growth. It was a 4.2 x 4.3 cm greater in size than the previous one noted at 4 x 3.8.  . Diabetes mellitus     Not on oral medication  . Hypertension   . Dyslipidemia, goal LDL below 70   . Osteoarthritis of both knees     And back; multiple back surgeries, right knee arthroplasty and left knee arthroscopic surgery x2  . Chronic back pain      multiple surgeries; C-spine and lumbar  . Erectile dysfunction   . H/O: pneumonia February 14  . Chronic anemia     On iron supplement; history  of positive guaiac - negative colonoscopy in 1996.; Thought to be related to hemorrhoids; status post hemorrhoidectomy  . Ankle edema     Chronic   Past Cardiovascular History  Procedure Laterality Date  . Coronary angioplasty  [id number],[id number]    1985 lateral STEMI Circumflex PTCA  . Coronary artery bypass graft  1990    LIMA-LAD, SVG-OM (now known to be occluded )  . Cardiac catheterization  September 2004    None Occluded vein graft to OM; diffuse RCA disease in the mid vessel, 80% circumflex-OM stenosis; follow on AV groove  circumflex with sequential 90% stenoses and intervening saccular dilation   . Percutaneous coronary stent intervention (pci-s)  September 2004    PCI - RCA 2 overlapping Taxus DES 2.75 mm x 32 mm and 2.75 mm x 12 mm (3.0 mm); PCI-Cx-OM1 - Taxus DES 3.0 mm x 20 mm (3.1 mm);   Marland Kitchen Percutaneous coronary stent intervention (pci-s)  March 2008    80% ISR in proximal Taxus stent in RCA -- covered proximally with Cypher DES 3.0 mm x 12 mm  . Cardiac catheterization  March 2010    4 abnormal Myoview showing apical thinning (possibly due to apical LAD 95%) : 100% Occluded LAD after SV1, distal LAD grafted via LIMA -apical 95% . Cx -OM1 w/patent stent extending into OM 1 . Follow on Cx - 70-80% - non-amenable PCI. RCA widely patent 3 overlapping stents in mRCA w/less than 40% stenosis in RPL; SVG-OM known occluded   . Nm myoview ltd  December 2013    LOW RISK. Mmoderate region of mid to basal inferolateral scar without ischemia. Mild apical hypokinesis with an EF of 47%.  . Transthoracic echocardiogram  December 2013    EF 55-60%. moderate LA dilation. Aortic Sclerosis  . Abdominal and lower extremity arterial ultrasound  08/23/2012; 10/10/2013    Normal ABIs. Nonocclusive lower extremity disease. 4.2 cm x 4.3 cm infrarenal AAA;; 4.4 cm x 4.3 cm (essentially stable)     MEDICATIONS AND ALLERGIES REVIEWED IN EPIC No Change in Social and Family History  ROS: A comprehensive Review of Systems - Negative except Significant knee, back and hip arthritis no real myalgias. Has been otherwise relatively stable with no recent illnesses.  PHYSICAL EXAM BP 122/78  Pulse 71  Ht 5\' 9"  (1.753 m)  Wt 218 lb 14.4 oz (99.292 kg)  BMI 32.31 kg/m2 General appearance: A&Ox3, cooperative, appears stated age, NAD.  Pleasant mood and affect, answers questions appropriately.  Neck: no adenopathy, no carotid bruit, no JVD and supple, symmetrical, trachea midline  Lungs: CTAB, normal percussion bilaterally and Good air  movement, nonlabored, no W./ R./R.  Heart: normal apical impulse, RRR, S1, S2 normal and 1-2/6 SEM at value S/P without radiation. No R./G.  Abdomen: soft, non-tender; bowel sounds normal; no masses, no organomegaly and No pulsatile mass or bruit  Extremities: extremities normal, atraumatic, no cyanosis or edema and no edema, redness or tenderness in the calves or thighs  Pulses: 2+ and symmetric  Neurologic: Grossly normal  HEENT: [place]/AT, EOMI, MMM, anicteric sclera  KDT:[id number] today: Yes Rate: 71 , Rhythm: NSR with first-degree AV block and occasional PVCs. Inferior MI, age undetermined. No significant change.  Recent Labs November 2014: Reviewed in Epic  Notable improvement in lipids: Total cholesterol down from 238-195; HDL up from 35-40; LDL down from 149-127; triglycerides down from 271-138 --> coinciding with the initiation of Zetia.  ASSESSMENT / PLAN: CAD S/P CABG and PCI - 3 DES stents  to RCA, one in circumflex-OM Kidneys are relatively stable from a cardiac symptoms standpoint. His baseline dyspnea has not changed. I last evaluated, she had a relatively normal nuclear stress test as well as echocardiogram.  Plan:   Continue Toprol, ARB/HCTZ combination.   In the absence of any GI bleeding issues or other bleeding issues, with the extent of 1st and 2nd generation DES stents, I would prefer to continue on with DAPT, although we could stop aspirin if there is a bleeding issue. On Protonix for GI prophylaxis.   Unfortunately he continues to be off of statin due to intolerance.  AAA (abdominal aortic aneurysm) without rupture Relatively stable as of January. Due for followup in 6 months per recommendation. I think if stable at that time, we could probably go back to annual evaluations but will defer to the reading vascular cardiologist.  History of ST elevation myocardial infarction (STEMI) of inferolateral wall (PTCA - 100% large lateral OM) Noted on Myoview. No active  symptoms to suggest heart failure or ongoing angina.  Hypertension associated with diabetes Well-controlled on current regimen.  Hyperlipidemia LDL goal <70; statin intolerant Very pleasant improvement since initiating Zetia. Continue to monitor by PCP.  SOB (shortness of breath)  This is probably multifactorial and may be simply related to deconditioning and arthritis. I recommended that he actually tried to do things like water aerobics or water walking which will help with his arthralgias and provide some resistance.    Orders Placed This Encounter  Procedures  . EKG 12-Lead  . Abdominal Aortic Aneurysm duplex    Standing Status: Future     Number of Occurrences:      Standing Expiration Date: 12/06/2014    Order Specific Question:  Where should this test be performed:    Answer:  [facility]   Meds ordered this encounter  Medications  . losartan-hydrochlorothiazide (HYZAAR) 100-12.5 MG per tablet    Sig: TAKE 1 TABLET EVERY DAY    Dispense:  90 tablet    Refill:  3   Followup: 6 months  DAVID W. Ellyn Hack, M.D., M.S. Interventional Cardiologist [facility]

## 2013-12-08 NOTE — Assessment & Plan Note (Signed)
Relatively stable as of January. Due for followup in 6 months per recommendation. I think if stable at that time, we could probably go back to annual evaluations but will defer to the reading vascular cardiologist.

## 2013-12-08 NOTE — Assessment & Plan Note (Signed)
Very pleasant improvement since initiating Zetia. Continue to monitor by PCP.

## 2013-12-08 NOTE — Assessment & Plan Note (Signed)
Noted on Myoview. No active symptoms to suggest heart failure or ongoing angina.

## 2013-12-08 NOTE — Assessment & Plan Note (Signed)
Well-controlled on current regimen. ?

## 2013-12-08 NOTE — Assessment & Plan Note (Signed)
This is probably multifactorial and may be simply related to deconditioning and arthritis. I recommended that he actually tried to do things like water aerobics or water walking which will help with his arthralgias and provide some resistance.

## 2013-12-08 NOTE — Assessment & Plan Note (Signed)
Kidneys are relatively stable from a cardiac symptoms standpoint. His baseline dyspnea has not changed. I last evaluated, she had a relatively normal nuclear stress test as well as echocardiogram.  Plan:   Continue Toprol, ARB/HCTZ combination.   In the absence of any GI bleeding issues or other bleeding issues, with the extent of 1st and 2nd generation DES stents, I would prefer to continue on with DAPT, although we could stop aspirin if there is a bleeding issue. On Protonix for GI prophylaxis.   Unfortunately he continues to be off of statin due to intolerance.

## 2013-12-12 ENCOUNTER — Telehealth: Payer: Self-pay | Admitting: Cardiology

## 2013-12-12 MED ORDER — PANTOPRAZOLE SODIUM 40 MG PO TBEC: 40.0000 mg | DELAYED_RELEASE_TABLET | Freq: Every day | ORAL | Status: DC

## 2013-12-12 NOTE — Telephone Encounter (Signed)
Is asking if we can call in Pantoprazole soddr 40 mg .. State that the pharmacy said that the doctor has to  Call this in . [facility] [address] .Marland Kitchen Please call if you have any questions .Marland Kitchen Thanks

## 2013-12-12 NOTE — Telephone Encounter (Signed)
Rx was sent to pharmacy electronically. 

## 2013-12-27 ENCOUNTER — Telehealth: Payer: Self-pay | Admitting: Cardiology

## 2013-12-27 MED ORDER — EZETIMIBE 10 MG PO TABS: 10.0000 mg | ORAL_TABLET | Freq: Every day | ORAL | Status: DC

## 2013-12-27 NOTE — Telephone Encounter (Signed)
Changed pharmacy from [facility] to [facility] [address]  Needs Zetia 10 mg 90 day supply  Please call

## 2013-12-27 NOTE — Telephone Encounter (Signed)
Refills sent to pharmacy as requested.

## 2013-12-31 ENCOUNTER — Ambulatory Visit: Payer: Medicare Other | Admitting: Cardiology

## 2014-01-31 ENCOUNTER — Telehealth: Payer: Self-pay | Admitting: Cardiology

## 2014-01-31 MED ORDER — METOPROLOL SUCCINATE ER 50 MG PO TB24: 50.0000 mg | ORAL_TABLET | Freq: Every day | ORAL | Status: DC

## 2014-01-31 NOTE — Telephone Encounter (Signed)
Need a new prescription because he changed pharmacy. He need his Metoprolol  ER Suc 50mg  #90 and refills.Please call to [phone]

## 2014-01-31 NOTE — Telephone Encounter (Signed)
Refills sent to pharmacy. 

## 2014-02-14 DIAGNOSIS — E119 Type 2 diabetes mellitus without complications: Secondary | ICD-10-CM | POA: Diagnosis not present

## 2014-02-14 DIAGNOSIS — H2510 Age-related nuclear cataract, unspecified eye: Secondary | ICD-10-CM | POA: Diagnosis not present

## 2014-02-14 DIAGNOSIS — H353190 Nonexudative age-related macular degeneration, unspecified eye, stage unspecified: Secondary | ICD-10-CM | POA: Diagnosis not present

## 2014-02-14 DIAGNOSIS — H524 Presbyopia: Secondary | ICD-10-CM | POA: Diagnosis not present

## 2014-02-14 DIAGNOSIS — H25019 Cortical age-related cataract, unspecified eye: Secondary | ICD-10-CM | POA: Diagnosis not present

## 2014-02-14 LAB — HM DIABETES EYE EXAM

## 2014-02-18 ENCOUNTER — Other Ambulatory Visit: Payer: Self-pay | Admitting: Family Medicine

## 2014-03-01 ENCOUNTER — Encounter: Payer: Self-pay | Admitting: Family Medicine

## 2014-03-05 DIAGNOSIS — L570 Actinic keratosis: Secondary | ICD-10-CM | POA: Diagnosis not present

## 2014-03-12 DIAGNOSIS — H2510 Age-related nuclear cataract, unspecified eye: Secondary | ICD-10-CM | POA: Diagnosis not present

## 2014-03-12 DIAGNOSIS — H269 Unspecified cataract: Secondary | ICD-10-CM | POA: Diagnosis not present

## 2014-03-14 ENCOUNTER — Encounter: Payer: Self-pay | Admitting: Internal Medicine

## 2014-03-15 ENCOUNTER — Telehealth: Payer: Self-pay | Admitting: Cardiology

## 2014-03-15 ENCOUNTER — Other Ambulatory Visit: Payer: Self-pay | Admitting: *Deleted

## 2014-03-15 MED ORDER — CHOLINE FENOFIBRATE 135 MG PO CPDR: 135.0000 mg | DELAYED_RELEASE_CAPSULE | Freq: Every day | ORAL | Status: DC

## 2014-03-15 NOTE — Telephone Encounter (Signed)
Needs Fenofibric 135 mg 1 daily  Needs to be called to [facility] on [address]  Needs 90 day supply  Please call

## 2014-03-15 NOTE — Telephone Encounter (Signed)
Med reordered.

## 2014-04-10 DIAGNOSIS — H25019 Cortical age-related cataract, unspecified eye: Secondary | ICD-10-CM | POA: Diagnosis not present

## 2014-04-10 DIAGNOSIS — H2510 Age-related nuclear cataract, unspecified eye: Secondary | ICD-10-CM | POA: Diagnosis not present

## 2014-04-16 DIAGNOSIS — L570 Actinic keratosis: Secondary | ICD-10-CM | POA: Diagnosis not present

## 2014-04-18 ENCOUNTER — Ambulatory Visit (HOSPITAL_COMMUNITY)
Admission: RE | Admit: 2014-04-18 | Discharge: 2014-04-18 | Disposition: A | Discharge status: Home / Self Care | Payer: Medicare Other | Source: Ambulatory Visit | Attending: Cardiology | Admitting: Cardiology

## 2014-04-18 DIAGNOSIS — I714 Abdominal aortic aneurysm, without rupture, unspecified: Secondary | ICD-10-CM | POA: Insufficient documentation

## 2014-04-18 DIAGNOSIS — I509 Heart failure, unspecified: Secondary | ICD-10-CM | POA: Insufficient documentation

## 2014-04-18 DIAGNOSIS — I5032 Chronic diastolic (congestive) heart failure: Secondary | ICD-10-CM

## 2014-04-18 NOTE — Progress Notes (Signed)
Abdominal Aortic Duplex Completed °Brianna L Mazza,RVT °

## 2014-04-23 DIAGNOSIS — H2510 Age-related nuclear cataract, unspecified eye: Secondary | ICD-10-CM | POA: Diagnosis not present

## 2014-04-24 ENCOUNTER — Encounter (HOSPITAL_COMMUNITY): Payer: Medicare Other

## 2014-04-25 NOTE — Progress Notes (Signed)
That is fine with me -- report suggests 6 months, but 1 yr is reasonable.  Leonie Man, MD

## 2014-04-26 ENCOUNTER — Telehealth: Payer: Self-pay | Admitting: Cardiology

## 2014-04-26 MED ORDER — DIGOXIN 125 MCG PO TABS: 0.1250 mg | ORAL_TABLET | Freq: Every day | ORAL | Status: DC

## 2014-04-26 NOTE — Telephone Encounter (Signed)
Pt is changing pharmacy.He need his Digoxin 0.125 mg#90. Please call to [phone].

## 2014-04-26 NOTE — Telephone Encounter (Signed)
RX refill sent to patient pharmacy

## 2014-05-14 ENCOUNTER — Inpatient Hospital Stay (HOSPITAL_COMMUNITY)
Admission: EM | Admit: 2014-05-14 | Discharge: 2014-05-18 | DRG: 247 | Disposition: A | Discharge status: Home / Self Care | Payer: Medicare Other | Attending: Internal Medicine | Admitting: Internal Medicine

## 2014-05-14 ENCOUNTER — Encounter (HOSPITAL_COMMUNITY): Payer: Self-pay | Admitting: Emergency Medicine

## 2014-05-14 ENCOUNTER — Emergency Department (HOSPITAL_COMMUNITY): Payer: Medicare Other

## 2014-05-14 DIAGNOSIS — Z833 Family history of diabetes mellitus: Secondary | ICD-10-CM

## 2014-05-14 DIAGNOSIS — I714 Abdominal aortic aneurysm, without rupture, unspecified: Secondary | ICD-10-CM | POA: Diagnosis present

## 2014-05-14 DIAGNOSIS — Z7982 Long term (current) use of aspirin: Secondary | ICD-10-CM | POA: Diagnosis not present

## 2014-05-14 DIAGNOSIS — R079 Chest pain, unspecified: Secondary | ICD-10-CM | POA: Diagnosis present

## 2014-05-14 DIAGNOSIS — E669 Obesity, unspecified: Secondary | ICD-10-CM | POA: Diagnosis present

## 2014-05-14 DIAGNOSIS — Z9861 Coronary angioplasty status: Secondary | ICD-10-CM

## 2014-05-14 DIAGNOSIS — R0789 Other chest pain: Secondary | ICD-10-CM | POA: Diagnosis not present

## 2014-05-14 DIAGNOSIS — I2582 Chronic total occlusion of coronary artery: Secondary | ICD-10-CM | POA: Diagnosis not present

## 2014-05-14 DIAGNOSIS — Z951 Presence of aortocoronary bypass graft: Secondary | ICD-10-CM

## 2014-05-14 DIAGNOSIS — Z7902 Long term (current) use of antithrombotics/antiplatelets: Secondary | ICD-10-CM | POA: Diagnosis not present

## 2014-05-14 DIAGNOSIS — E785 Hyperlipidemia, unspecified: Secondary | ICD-10-CM | POA: Diagnosis present

## 2014-05-14 DIAGNOSIS — Z981 Arthrodesis status: Secondary | ICD-10-CM | POA: Diagnosis not present

## 2014-05-14 DIAGNOSIS — I129 Hypertensive chronic kidney disease with stage 1 through stage 4 chronic kidney disease, or unspecified chronic kidney disease: Secondary | ICD-10-CM | POA: Diagnosis present

## 2014-05-14 DIAGNOSIS — Z9889 Other specified postprocedural states: Secondary | ICD-10-CM | POA: Diagnosis present

## 2014-05-14 DIAGNOSIS — E1169 Type 2 diabetes mellitus with other specified complication: Secondary | ICD-10-CM

## 2014-05-14 DIAGNOSIS — E119 Type 2 diabetes mellitus without complications: Secondary | ICD-10-CM | POA: Diagnosis present

## 2014-05-14 DIAGNOSIS — I498 Other specified cardiac arrhythmias: Secondary | ICD-10-CM | POA: Diagnosis present

## 2014-05-14 DIAGNOSIS — T82897A Other specified complication of cardiac prosthetic devices, implants and grafts, initial encounter: Secondary | ICD-10-CM | POA: Diagnosis not present

## 2014-05-14 DIAGNOSIS — Z79899 Other long term (current) drug therapy: Secondary | ICD-10-CM

## 2014-05-14 DIAGNOSIS — K219 Gastro-esophageal reflux disease without esophagitis: Secondary | ICD-10-CM | POA: Diagnosis present

## 2014-05-14 DIAGNOSIS — E118 Type 2 diabetes mellitus with unspecified complications: Secondary | ICD-10-CM | POA: Diagnosis present

## 2014-05-14 DIAGNOSIS — Z87891 Personal history of nicotine dependence: Secondary | ICD-10-CM | POA: Diagnosis not present

## 2014-05-14 DIAGNOSIS — I252 Old myocardial infarction: Secondary | ICD-10-CM

## 2014-05-14 DIAGNOSIS — G4733 Obstructive sleep apnea (adult) (pediatric): Secondary | ICD-10-CM | POA: Diagnosis present

## 2014-05-14 DIAGNOSIS — Z6830 Body mass index (BMI) 30.0-30.9, adult: Secondary | ICD-10-CM

## 2014-05-14 DIAGNOSIS — Z8679 Personal history of other diseases of the circulatory system: Secondary | ICD-10-CM | POA: Diagnosis present

## 2014-05-14 DIAGNOSIS — R072 Precordial pain: Secondary | ICD-10-CM | POA: Diagnosis not present

## 2014-05-14 DIAGNOSIS — Y849 Medical procedure, unspecified as the cause of abnormal reaction of the patient, or of later complication, without mention of misadventure at the time of the procedure: Secondary | ICD-10-CM | POA: Diagnosis present

## 2014-05-14 DIAGNOSIS — I2510 Atherosclerotic heart disease of native coronary artery without angina pectoris: Secondary | ICD-10-CM | POA: Diagnosis not present

## 2014-05-14 DIAGNOSIS — Z8249 Family history of ischemic heart disease and other diseases of the circulatory system: Secondary | ICD-10-CM

## 2014-05-14 DIAGNOSIS — I110 Hypertensive heart disease with heart failure: Secondary | ICD-10-CM | POA: Diagnosis present

## 2014-05-14 DIAGNOSIS — Z96659 Presence of unspecified artificial knee joint: Secondary | ICD-10-CM

## 2014-05-14 DIAGNOSIS — I2119 ST elevation (STEMI) myocardial infarction involving other coronary artery of inferior wall: Secondary | ICD-10-CM

## 2014-05-14 DIAGNOSIS — I200 Unstable angina: Secondary | ICD-10-CM | POA: Diagnosis present

## 2014-05-14 DIAGNOSIS — E1159 Type 2 diabetes mellitus with other circulatory complications: Secondary | ICD-10-CM

## 2014-05-14 DIAGNOSIS — I25119 Atherosclerotic heart disease of native coronary artery with unspecified angina pectoris: Secondary | ICD-10-CM

## 2014-05-14 DIAGNOSIS — I5032 Chronic diastolic (congestive) heart failure: Secondary | ICD-10-CM | POA: Diagnosis present

## 2014-05-14 DIAGNOSIS — I10 Essential (primary) hypertension: Secondary | ICD-10-CM | POA: Diagnosis not present

## 2014-05-14 DIAGNOSIS — N189 Chronic kidney disease, unspecified: Secondary | ICD-10-CM | POA: Diagnosis present

## 2014-05-14 DIAGNOSIS — E11A Type 2 diabetes mellitus without complications in remission: Secondary | ICD-10-CM | POA: Diagnosis present

## 2014-05-14 LAB — BASIC METABOLIC PANEL
Anion gap: 12 (ref 5–15)
BUN: 23 mg/dL (ref 6–23)
CALCIUM: 9 mg/dL (ref 8.4–10.5)
CO2: 24 mEq/L (ref 19–32)
Chloride: 104 mEq/L (ref 96–112)
Creatinine, Ser: 1.6 mg/dL — ABNORMAL HIGH (ref 0.50–1.35)
GFR calc Af Amer: 47 mL/min — ABNORMAL LOW (ref >90)
GFR calc non Af Amer: 40 mL/min — ABNORMAL LOW (ref >90)
GLUCOSE: 152 mg/dL — AB (ref 70–99)
Potassium: 4.8 mEq/L (ref 3.7–5.3)
SODIUM: 140 meq/L (ref 137–147)

## 2014-05-14 LAB — CBC
HCT: 36.3 % — ABNORMAL LOW (ref 39.0–52.0)
HEMOGLOBIN: 11.9 g/dL — AB (ref 13.0–17.0)
MCH: 31.6 pg (ref 26.0–34.0)
MCHC: 32.8 g/dL (ref 30.0–36.0)
MCV: 96.3 fL (ref 78.0–100.0)
Platelets: 167 10*3/uL (ref 150–400)
RBC: 3.77 MIL/uL — AB (ref 4.22–5.81)
RDW: 14.3 % (ref 11.5–15.5)
WBC: 4.7 10*3/uL (ref 4.0–10.5)

## 2014-05-14 LAB — I-STAT TROPONIN, ED: Troponin i, poc: 0 ng/mL (ref 0.00–0.08)

## 2014-05-14 LAB — DIGOXIN LEVEL: Digoxin Level: 0.7 ng/mL — ABNORMAL LOW (ref 0.8–2.0)

## 2014-05-14 LAB — MRSA PCR SCREENING: MRSA by PCR: NEGATIVE

## 2014-05-14 LAB — TROPONIN I

## 2014-05-14 MED ORDER — LOSARTAN POTASSIUM-HCTZ 100-12.5 MG PO TABS: 1.0000 | ORAL_TABLET | Freq: Every day | ORAL | Status: DC

## 2014-05-14 MED ORDER — METOPROLOL SUCCINATE ER 50 MG PO TB24
50.0000 mg | ORAL_TABLET | Freq: Every day | ORAL | Status: DC
  Administered 2014-05-15 – 2014-05-18 (×3): 50 mg via ORAL
  Filled 2014-05-14 (×6): qty 1

## 2014-05-14 MED ORDER — CLOPIDOGREL BISULFATE 75 MG PO TABS
75.0000 mg | ORAL_TABLET | Freq: Every day | ORAL | Status: DC
  Administered 2014-05-15 – 2014-05-18 (×5): 75 mg via ORAL
  Filled 2014-05-14 (×5): qty 1

## 2014-05-14 MED ORDER — ASPIRIN EC 81 MG PO TBEC
81.0000 mg | DELAYED_RELEASE_TABLET | Freq: Every day | ORAL | Status: DC
  Administered 2014-05-16: 81 mg via ORAL
  Filled 2014-05-14: qty 1

## 2014-05-14 MED ORDER — SODIUM CHLORIDE 0.9 % IJ SOLN
3.0000 mL | Freq: Two times a day (BID) | INTRAMUSCULAR | Status: DC
  Administered 2014-05-15: 3 mL via INTRAVENOUS

## 2014-05-14 MED ORDER — SODIUM CHLORIDE 0.9 % IV SOLN
INTRAVENOUS | Status: DC
  Administered 2014-05-15 (×2): via INTRAVENOUS

## 2014-05-14 MED ORDER — LOSARTAN POTASSIUM 50 MG PO TABS
100.0000 mg | ORAL_TABLET | Freq: Every day | ORAL | Status: DC
  Administered 2014-05-15: 100 mg via ORAL
  Filled 2014-05-14: qty 2

## 2014-05-14 MED ORDER — SODIUM CHLORIDE 0.9 % IJ SOLN: 3.0000 mL | INTRAMUSCULAR | Status: DC | PRN

## 2014-05-14 MED ORDER — ASPIRIN 81 MG PO CHEW
81.0000 mg | CHEWABLE_TABLET | ORAL | Status: AC
  Administered 2014-05-15: 81 mg via ORAL
  Filled 2014-05-14: qty 1

## 2014-05-14 MED ORDER — ADULT MULTIVITAMIN W/MINERALS CH
1.0000 | ORAL_TABLET | Freq: Every day | ORAL | Status: DC
  Administered 2014-05-15 – 2014-05-18 (×4): 1 via ORAL
  Filled 2014-05-14 (×4): qty 1

## 2014-05-14 MED ORDER — ASPIRIN 81 MG PO TABS: 81.0000 mg | ORAL_TABLET | Freq: Every day | ORAL | Status: DC

## 2014-05-14 MED ORDER — PANTOPRAZOLE SODIUM 40 MG PO TBEC
40.0000 mg | DELAYED_RELEASE_TABLET | Freq: Every day | ORAL | Status: DC
  Administered 2014-05-15 – 2014-05-18 (×4): 40 mg via ORAL
  Filled 2014-05-14 (×4): qty 1

## 2014-05-14 MED ORDER — NITROGLYCERIN 0.4 MG SL SUBL
0.4000 mg | SUBLINGUAL_TABLET | SUBLINGUAL | Status: DC | PRN
  Administered 2014-05-14 (×3): 0.4 mg via SUBLINGUAL

## 2014-05-14 MED ORDER — HYDROCHLOROTHIAZIDE 12.5 MG PO CAPS
12.5000 mg | ORAL_CAPSULE | Freq: Every day | ORAL | Status: DC
  Filled 2014-05-14: qty 1

## 2014-05-14 MED ORDER — PREDNISOLONE ACETATE 1 % OP SUSP
1.0000 [drp] | Freq: Four times a day (QID) | OPHTHALMIC | Status: DC
  Administered 2014-05-15 – 2014-05-17 (×12): 1 [drp] via OPHTHALMIC
  Filled 2014-05-14: qty 1

## 2014-05-14 MED ORDER — FERROUS SULFATE 325 (65 FE) MG PO TABS
325.0000 mg | ORAL_TABLET | Freq: Every day | ORAL | Status: DC
  Administered 2014-05-15 – 2014-05-18 (×4): 325 mg via ORAL
  Filled 2014-05-14 (×6): qty 1

## 2014-05-14 MED ORDER — ONDANSETRON HCL 4 MG/2ML IJ SOLN: 4.0000 mg | Freq: Four times a day (QID) | INTRAMUSCULAR | Status: DC | PRN

## 2014-05-14 MED ORDER — FENOFIBRATE 160 MG PO TABS
160.0000 mg | ORAL_TABLET | Freq: Every day | ORAL | Status: DC
Start: 2014-05-15 — End: 2014-05-18
  Administered 2014-05-15 – 2014-05-18 (×4): 160 mg via ORAL
  Filled 2014-05-14 (×4): qty 1

## 2014-05-14 MED ORDER — METHOCARBAMOL 750 MG PO TABS
750.0000 mg | ORAL_TABLET | Freq: Four times a day (QID) | ORAL | Status: DC | PRN
  Filled 2014-05-14: qty 1

## 2014-05-14 MED ORDER — ACETAMINOPHEN 325 MG PO TABS: 650.0000 mg | ORAL_TABLET | ORAL | Status: DC | PRN

## 2014-05-14 MED ORDER — HEPARIN BOLUS VIA INFUSION
4000.0000 [IU] | Freq: Once | INTRAVENOUS | Status: AC
  Administered 2014-05-14: 4000 [IU] via INTRAVENOUS
  Filled 2014-05-14: qty 4000

## 2014-05-14 MED ORDER — EZETIMIBE 10 MG PO TABS
10.0000 mg | ORAL_TABLET | Freq: Every day | ORAL | Status: DC
  Administered 2014-05-15 – 2014-05-18 (×4): 10 mg via ORAL
  Filled 2014-05-14 (×4): qty 1

## 2014-05-14 MED ORDER — HEPARIN (PORCINE) IN NACL 100-0.45 UNIT/ML-% IJ SOLN
1350.0000 [IU]/h | INTRAMUSCULAR | Status: DC
  Administered 2014-05-14: 1100 [IU]/h via INTRAVENOUS
  Administered 2014-05-15: 1350 [IU]/h via INTRAVENOUS
  Filled 2014-05-14 (×3): qty 250

## 2014-05-14 MED ORDER — SODIUM CHLORIDE 0.9 % IV SOLN: 250.0000 mL | INTRAVENOUS | Status: DC | PRN

## 2014-05-14 NOTE — ED Notes (Addendum)
Pt states midsternal chest pain radiating to L hand. Onset Monday evening, also states SOB. 8/10 pain upon arrival to ED. Respirations unlabored. Lung sounds clear and equal bilaterally. Skin warm and dry. Pt is alert and oriented x4. History of cardiac stent placement and CHF.

## 2014-05-14 NOTE — H&P (Signed)
Patient ID: [name] MRN: [id number], DOB/AGE: 1935-12-04   Admit date: 05/14/2014   Primary Physician: [name], MD Primary Cardiologist: Dr. Ellyn Hack  Pt. Profile:   78 year old Caucasian male with past medical history significant for history of diastolic heart failure, hypertension, diabetes, hyperlipidemia, obstructive sleep apnea, chronic kidney disease, AAA, and history of significant coronary artery disease status post CABG x2 in 1997 present with CP reminiscent of previous MI  Problem List  Past Medical History  Diagnosis Date  . History of: ST elevation myocardial infarction (STEMI) involving left circumflex coronary artery with complication 5797    PTCA-circumflex; PCI in 1991  . CAD, multiple vessel 1985-2010    Most recent cath 01/01/09: 100% Occluded LAD after SP1, patent LIMA-distal LAD with apical 90% lesion.  Cx-OM1 widely patent stent into proximal bifurcating OM1 . Follow-on Cx => 70-80% -- non-amenable PCI. Widely patent 3 overlapping stents in midRCA with only 40% RPL stenosis; SVG- OM known to be occluded.  . S/P CABG x 2 1997    LIMA-LAD, SVG-OM  . H/O unstable angina 06/2003, 12/2006,    '04: Staged Taxus DES PCI to RCA and circumflex-OM;;'08 - PCI to proximal ISR in RCA - Cypher DES  . History of nuclear stress test December 2013    LOW at Risk. Moderate region of mid to basal inferolateral scar without ischemia -- consistent with distal circumflex disease. Mild apical hypokinesis with an EF of 47%.  . Irregular heart beat     PVCs and PACs, no arrhythmia recorded  . Exertional dyspnea     Chronic baseline SOB with ambulation  . Sleep apnea     pt. states he was told to return for f/u, to be fitted for CPAp, but pt. reports that he didn't follow up  . AAA (abdominal aortic aneurysm) 08/30/12    Doppler 08/30/12 had a small amount of growth. It was a 4.2 x 4.3 cm greater in size than the previous one noted at 4 x 3.8.  . Diabetes mellitus       Not on oral medication  . Hypertension   . Dyslipidemia, goal LDL below 70   . Osteoarthritis of both knees     And back; multiple back surgeries, right knee arthroplasty and left knee arthroscopic surgery x2  . Chronic back pain      multiple surgeries; C-spine and lumbar  . Erectile dysfunction   . H/O: pneumonia February 14  . Chronic anemia     On iron supplement; history of positive guaiac - negative colonoscopy in 1996.; Thought to be related to hemorrhoids; status post hemorrhoidectomy  . Ankle edema     Chronic    Past Surgical History  Procedure Laterality Date  . Coronary angioplasty  2820,6015    1985 lateral STEMI Circumflex PTCA  . Anterior lat lumbar fusion Left 11/22/2012    Procedure: ANTERIOR LATERAL LUMBAR FUSION 1 LEVEL;  Surgeon: Eustace Moore, MD;  Location: [facility];  Service: Neurosurgery;  Laterality: Left;  Anterior Lateral Lumbar Fusion Lumbar Three-Four  . Lumbar percutaneous pedicle screw 1 level N/A 11/22/2012    Procedure: LUMBAR PERCUTANEOUS PEDICLE SCREW 1 LEVEL;  Surgeon: Eustace Moore, MD;  Location: [facility];  Service: Neurosurgery;  Laterality: N/A;  Lumbar Three-Four Percutaneous Pedicle Screw, Lateral approach  . Back surgery  1979 & x 10    pt. remarks, "I have had about 10 back surgeries"  . Posterior cervical fusion/foraminotomy N/A 05/02/2013  Procedure: POSTERIOR CERVICAL FUSION/FORAMINOTOMY CERVICAL SEVEN THORACIC-ONE;  Surgeon: Eustace Moore, MD;  Location: [facility];  Service: Neurosurgery;  Laterality: N/A;  POSTERIOR CERVICAL FUSION/FORAMINOTOMY CERVICAL SEVEN THORACIC-ONE  . Coronary artery bypass graft  1990    LIMA-LAD, SVG-OM  . Cardiac catheterization  September 2004    None Occluded vein graft to OM; diffuse RCA disease in the mid vessel, 80% circumflex-OM stenosis; follow on AV groove circumflex with sequential 90% stenoses and intervening saccular dilation   . Percutaneous coronary stent intervention (pci-s)   September 2004    PCI - RCA 2 overlapping Taxus DES 2.75 mm x 32 mm and 2.75 mm x 12 mm (3.0 mm); PCI-Cx-OM1 - Taxus DES 3.0 mm x 20 mm (3.1 mm);   Marland Kitchen Percutaneous coronary stent intervention (pci-s)  March 2008    80% ISR in proximal Taxus stent in RCA -- covered proximally with Cypher DES 3.0 mm x 12 mm  . Cardiac catheterization  March 2010    4 abnormal Myoview showing apical thinning (possibly due to apical LAD 95%) : 100% Occluded LAD after SV1, distal LAD grafted via LIMA -apical 95% . Cx -OM1 w/patent stent extending into OM 1 . Follow on Cx - 70-80% - non-amenable PCI. RCA widely patent 3 overlapping stents in mRCA w/less than 40% stenosisin RPL; SVG-OM known occluded   . Cervical discectomy  04/23/10    Decompressive anterior carvical diskectomy. C4-5, C6-7  . Cervical arthrodesis  04/23/10    Anterior cervical arthrodesis, C4-5, C6-7 utilizing 7-mm PEEK interbody cage packed with local autograft & Antifuse putty at C4-5 & an 8-mm cage at C6-7.  Marland Kitchen Anterior cervical plating  04/23/10    At C4-5 and a C6-7 utilizing two separate Biomet MaxAn plates.  . Colonoscopy  1996  . Minor hemorrhoidectomy    . Total knee arthroplasty Right   . Knee arthroscopy Left     x 2  . Nm myoview ltd  December 2013    LOW RISK. Mmoderate region of mid to basal inferolateral scar without ischemia. Mild apical hypokinesis with an EF of 47%.  . Transthoracic echocardiogram  December 2013    EF 55-60%. moderate LA dilation. Aortic Sclerosis  . Abdominal and lower extremity arterial ultrasound  08/23/2012; 10/10/2013    Normal ABIs. Nonocclusive lower extremity disease. 4.2 cm x 4.3 cm infrarenal AAA;; 4.4 cm x 4.3 cm (essentially stable)   . Cataract extraction       Allergies  Allergies  Allergen Reactions  . Lisinopril Cough    Rxn: unknown  . Statins Other (See Comments)    Muscle ache  . Welchol [Colesevelam Hcl] Itching    HPI  The patient is a 78 year old Caucasian male with past medical  history significant for history of diastolic heart failure, hypertension, diabetes, hyperlipidemia, obstructive sleep apnea, chronic kidney disease, AAA, and history of significant coronary artery disease status post CABG x2 in 1997. According to medical record, patient had multiple MIs since 1985 including multiple PCI procedures. His last cardiac catheterization was on 01/01/2009 which revealed EF 50%, left circumflex 70-80% narrowing, however the segment was followed by a saccular aneurysmal segment and is not amenable to PCI intervention, patent LIMA to LAD, SVG to OM was chronically occluded, widely patent stents in native OM and RCA. His last stress test on 09/22/2012 was low risk nuclear stress test demonstrated a moderate regional mid to basal inferior to inferolateral scar with no significant ischemia, with EF 47%. Patient has been  doing relatively well since that time, however he's been troubled with significant cervical and back pain which limits his ability to ambulate. He was seen by Dr. Ellyn Hack in March 2015, at which time he was still doing well. Unfortunately the patient has been intolerant to any of the statin medications, however he was placed on Zetia which started to help with his cholesterol profile. He states he has some lower extremity swelling last week however has since resolved. He denies any recent fever, chill, cough, orthopnea or paroxysmal nocturnal dyspnea. He denies any recent abdominal pain, and states his last ultrasound of the abdomen revealed stable AAA in June.  Patient was walking outside all day yesterday on 05/13/2014. Around 4 PM he started having left-sided burning sensation and squeeze sensation reminiscent of his previous angina symptom. The chest discomfort was also associated with some shortness of breath as well which he attributed to overexertion. The chest discomfort eventually went away after half hour. The patient woke up with significant substernal chest pain and  left hand numbness around 3 AM in the morning of 05/14/2014, which prompted him to seek medical attention at [facility] ED. By the time he reached ED, patient chest pain began to resolve after about 5 hours. He has had a history of GERD, however this episode feels different from his usual acid reflux symptom. On initial presentation, his blood pressure was stable with blood pressure 142/71. He has some bradycardia with heart rate in the high 40s and 50s. Telemetry also noticed the patient has some bigeminy as well. Significant laboratory finding include creatinine of 1.6 which has increased from 1.45 nine month ago. His hemoglobin stable around 11.9. Initial point-of-care troponin was negative.  Chest x-ray was negative for acute process. EKG showed some Q waves in the inferior lead, and a downsloping of V1 and V2 which was present since his previous EKG in 2014. Cardiology was consulted for chest pain.     Home Medications  Prior to Admission medications   Medication Sig Start Date End Date Taking? Authorizing Provider  aspirin 81 MG tablet Take 81 mg by mouth daily.   Yes Historical Provider, MD  Choline Fenofibrate (TRILIPIX) 135 MG capsule Take 1 capsule (135 mg total) by mouth daily. 03/15/14  Yes Leonie Man, MD  clopidogrel (PLAVIX) 75 MG tablet Take 1 tablet (75 mg total) by mouth daily. 11/26/13  Yes Leonie Man, MD  digoxin (LANOXIN) 0.125 MG tablet Take 1 tablet (0.125 mg total) by mouth daily. 04/26/14  Yes Leonie Man, MD  ezetimibe (ZETIA) 10 MG tablet Take 1 tablet (10 mg total) by mouth daily. 12/27/13  Yes Leonie Man, MD  ferrous sulfate 325 (65 FE) MG tablet Take 325 mg by mouth daily with breakfast.   Yes Historical Provider, MD  losartan-hydrochlorothiazide (HYZAAR) 100-12.5 MG per tablet Take 1 tablet by mouth daily.   Yes Historical Provider, MD  methocarbamol (ROBAXIN) 750 MG tablet Take 750 mg by mouth every 6 (six) hours as needed for muscle spasms.  05/21/13  Yes  Historical Provider, MD  metoprolol succinate (TOPROL-XL) 50 MG 24 hr tablet Take 1 tablet (50 mg total) by mouth daily before breakfast. 01/31/14  Yes Leonie Man, MD  Multiple Vitamins-Minerals (MULTIVITAMIN WITH MINERALS) tablet Take 1 tablet by mouth daily.    Yes Historical Provider, MD  pantoprazole (PROTONIX) 40 MG tablet Take 1 tablet (40 mg total) by mouth daily. 12/12/13  Yes Leonie Man, MD  pioglitazone-metformin (ACTOPLUS  MET) 15-500 MG per tablet Take 1 tablet by mouth daily.   Yes Historical Provider, MD  prednisoLONE acetate (PRED FORTE) 1 % ophthalmic suspension Place 1 drop into the left eye 4 (four) times daily.   Yes Historical Provider, MD    Family History  Family History  Problem Relation Age of Onset  . Arthritis Mother   . Diabetes Father   . Heart disease Father   . Stroke Sister   . Hypertension Sister   . Heart disease Sister   . Diabetes Sister   . Breast cancer Sister   . Heart disease Brother   . Ulcers Brother   . Colon cancer Neg Hx     Social History  History   Social History  . Marital Status: Married    Spouse Name: N/A    Number of Children: N/A  . Years of Education: N/A   Occupational History  . Not on file.   Social History Main Topics  . Smoking status: Former Smoker    Types: Cigarettes    Quit date: 10/05/1983  . Smokeless tobacco: Never Used  . Alcohol Use: 1.8 oz/week    3 Cans of beer per week     Comment: almost every day  . Drug Use: No  . Sexual Activity: Not Currently   Other Topics Concern  . Not on file   Social History Narrative   He is married, father of two, grandfather to 76, great grandfather to two.    Not really getting much exercise now, do to his significant back and hip pain.    He does not smoke and only has an alcoholic beverage.      Review of Systems General:  No chills, fever, night sweats or weight changes.  Cardiovascular:  No orthopnea, palpitations, paroxysmal nocturnal dyspnea.  + chest pain, dyspnea on exertion, LE edema last wk, however resolved Dermatological: No rash, lesions/masses Respiratory: No cough, dyspnea Urologic: No hematuria, dysuria Abdominal:   No nausea, vomiting, diarrhea, bright red blood per rectum, melena, or hematemesis Neurologic:  No visual changes, wkns, changes in mental status. All other systems reviewed and are otherwise negative except as noted above.  Physical Exam  Blood pressure 107/83, pulse 49, resp. rate 22, SpO2 100.00%.  General: Pleasant, NAD Psych: Normal affect. Neuro: Alert and oriented X 3. Moves all extremities spontaneously. HEENT: Normal  Neck: Supple without bruits or JVD. Lungs:  Resp regular and unlabored, CTA. Heart: RRR no s3, s4, or murmurs. Abdomen: Soft, non-tender, non-distended, BS + x 4.  Extremities: No clubbing, cyanosis or edema. DP/PT/Radials 2+ and equal bilaterally.  Labs  Troponin [facility] Test)  Recent Labs  05/14/14 0935  TROPIPOC 0.00   No results found for this basename: CKTOTAL, CKMB, TROPONINI,  in the last 72 hours Lab Results  Component Value Date   WBC 4.7 05/14/2014   HGB 11.9* 05/14/2014   HCT 36.3* 05/14/2014   MCV 96.3 05/14/2014   PLT 167 05/14/2014    Recent Labs Lab 05/14/14 0930  NA 140  K 4.8  CL 104  CO2 24  BUN 23  CREATININE 1.60*  CALCIUM 9.0  GLUCOSE 152*   Lab Results  Component Value Date   CHOL 195 08/17/2013   HDL 40 08/17/2013   LDLCALC 127* 08/17/2013   TRIG 138 08/17/2013   Lab Results  Component Value Date   DDIMER 1.12* 11/24/2012     Radiology/Studies  Dg Chest 2 View  05/14/2014  CLINICAL DATA:  Mid sternal chest pain.  History of cardiac disease.  EXAM: CHEST  2 VIEW  COMPARISON:  11/25/2012 and 11/24/2012  FINDINGS: Postsurgical changes consistent with a CABG. Surgical fusion hardware in the lower cervical spine. Lungs are clear bilaterally. Stable appearance of the heart and mediastinum. Degenerative changes in the lower  thoracic spine. No pleural effusions and no evidence for pulmonary edema.  IMPRESSION: No active cardiopulmonary disease.   Electronically Signed   By: Markus Daft M.D.   On: 05/14/2014 11:14    ECG  Normal sinus rhythm with Q-wave in inferior lead, ST downsloping in V1 and V2 unchanged from previous EKG in 2014  ASSESSMENT AND PLAN  1. CP conerning for unstable angina  - sx similar to previous MI  - serial troponin overnight, hydrate kidney overnight  - some bigeminy on tele, may be related to ischemia  - will plan for cardiac cath tomorrow  2. coronary artery disease status post CABG x2 in 1997  - last cath 01/01/2009 which revealed EF 50%, left circumflex 70-80% narrowing, however the segment was followed by a saccular aneurysmal segment and is not amenable to PCI intervention, patent LIMA to LAD, SVG to OM was chronically occluded, widely patent stents in native OM and RCA  - stress test on 09/22/2012 was low risk nuclear stress test demonstrated a moderate regional mid to basal inferior to inferolateral scar with no significant ischemia, with EF 91%  3. h/o diastolic heart failure 4. Hypertension 5. Diabetes 6. Hyperlipidemia 7. obstructive sleep apnea 8. chronic kidney disease 9. AAA, stable in size, 4.4x4.4cm in distal aorta June 2015 10. Bradycardia: not sure why patient is taking digoxin. Will get dig level. Consider stop.  Hilbert Corrigan, PA-C 05/14/2014, 2:25 PM

## 2014-05-14 NOTE — ED Notes (Signed)
Pt went into ventricular bigeminy; BP 136/90, denies CP; notified resident and Dr Stevie Kern, pt back into regular rhythm

## 2014-05-14 NOTE — ED Notes (Signed)
Pt denies chest pain at the time. Vital signs stable. Pt and family updated on pending bed placement. No signs of distress noted. Respirations unlabored. Pt moved to POD C, report given to [place], Therapist, sports.

## 2014-05-14 NOTE — ED Notes (Signed)
He states "my chest was hurting yesterday and then this morning it woke me up at 3 am hurting again. It feels like a burning and its making my left hand feel numb."

## 2014-05-14 NOTE — Progress Notes (Signed)
ANTICOAGULATION CONSULT NOTE - Initial Consult  Pharmacy Consult for heparin Indication: chest pain/ACS  Allergies  Allergen Reactions  . Lisinopril Cough    Rxn: unknown  . Statins Other (See Comments)    Muscle ache  . Welchol [Colesevelam Hcl] Itching    Patient Measurements: Ht= 5' 9'' Wt= 97.7kg (215lbs reported by patient) IBW= 70.7 Heparin dosing wt: 92kg  Vital Signs: BP: 107/83 mmHg (08/11 1405) Pulse Rate: 49 (08/11 1405)  Labs:  Recent Labs  05/14/14 0930  HGB 11.9*  HCT 36.3*  PLT 167  CREATININE 1.60*    The CrCl is unknown because both a height and weight (above a minimum accepted value) are required for this calculation.   Medical History: Past Medical History  Diagnosis Date  . History of: ST elevation myocardial infarction (STEMI) involving left circumflex coronary artery with complication 3976    PTCA-circumflex; PCI in 1991  . CAD, multiple vessel 1985-2010    Most recent cath 01/01/09: 100% Occluded LAD after SP1, patent LIMA-distal LAD with apical 90% lesion.  Cx-OM1 widely patent stent into proximal bifurcating OM1 . Follow-on Cx => 70-80% -- non-amenable PCI. Widely patent 3 overlapping stents in midRCA with only 40% RPL stenosis; SVG- OM known to be occluded.  . S/P CABG x 2 1997    LIMA-LAD, SVG-OM  . H/O unstable angina 06/2003, 12/2006,    '04: Staged Taxus DES PCI to RCA and circumflex-OM;;'08 - PCI to proximal ISR in RCA - Cypher DES  . History of nuclear stress test December 2013    LOW at Risk. Moderate region of mid to basal inferolateral scar without ischemia -- consistent with distal circumflex disease. Mild apical hypokinesis with an EF of 47%.  . Irregular heart beat     PVCs and PACs, no arrhythmia recorded  . Exertional dyspnea     Chronic baseline SOB with ambulation  . Sleep apnea     pt. states he was told to return for f/u, to be fitted for CPAp, but pt. reports that he didn't follow up  . AAA (abdominal aortic aneurysm)  08/30/12    Doppler 08/30/12 had a small amount of growth. It was a 4.2 x 4.3 cm greater in size than the previous one noted at 4 x 3.8.  . Diabetes mellitus     Not on oral medication  . Hypertension   . Dyslipidemia, goal LDL below 70   . Osteoarthritis of both knees     And back; multiple back surgeries, right knee arthroplasty and left knee arthroscopic surgery x2  . Chronic back pain      multiple surgeries; C-spine and lumbar  . Erectile dysfunction   . H/O: pneumonia February 14  . Chronic anemia     On iron supplement; history of positive guaiac - negative colonoscopy in 1996.; Thought to be related to hemorrhoids; status post hemorrhoidectomy  . Ankle edema     Chronic     Assessment: 78 yo male here with CP (noted with hx of MI and CABG) to begin heparin for r/o ACS.  She is noted for cath in am.  Goal of Therapy:  Heparin level 0.3-0.7 units/ml Monitor platelets by anticoagulation protocol: Yes   Plan:  -Heparin bolus 4000 units IV followed by 1100 units/hr (~12 units/kg/hr) -Heparin level in 8 hours and daily wth CBC daily  Hildred Laser, Pharm D 05/14/2014 3:40 PM

## 2014-05-14 NOTE — ED Notes (Signed)
Pt resting quietly at the time. Vital signs stable. Remains on cardiac monitor. Denies chest pain. Family at bedside.

## 2014-05-14 NOTE — H&P (Signed)
Pt. Seen and examined. Agree with the NP/PA-C note as written. Very pleasant 78 yo male patient of Dr. Ellyn Hack with unfortunately severe CAD. He has had numerous PCI's and CABG x 2 (with occluded SVG).  Has basically been chest pain free since 2010. He also has a known AAA. Now presents with chest pain and left arm pain over the past few days. Symptoms are reminiscent of his prior angina. This morning he awakened with the same pain, only more severe and involving his left arm. Took some of his son's nitro (his was old and crumbled) and had relief. Initial troponin was negative. He is also noted to have bigeminal PVC's on admission with a low HR. He is on digoxin for PAC's/PVC's, which I would advise holding. Also, he reported blood on his underwear, but noted no penile discharge or hematuria. Not clear where this came from.  I would recommend admission to evaluate for ACS. Difficult to r/o angina non-invasively. He should be hydrated aggressively and would recommend at least a diagnostic LHC (Cr 1.6) tomorrow. If there is no new CAD, then his symptoms may be neuropathic - he does have prior spinal fusion and cervical spine disease as well.  Pixie Casino, MD, [facility] Attending Cardiologist [place]

## 2014-05-14 NOTE — ED Provider Notes (Signed)
CSN: [id number]     Arrival date & time 05/14/14  0906 History   First MD Initiated Contact with Patient 05/14/14 0912   History provided by patient. Wife at bedside contributed to history.   Chief Complaint  Patient presents with  . Chest Pain   HPI  Patient reports complaint of left-sided chest pain and associated left hand (index, long, and ring finger numbness) started yesterday evening. CP described as "burning and tightness" non-radiating, gradual onset w/o exertional symptoms but onset occurred several hours after working outside in the heat (denied significant exertion, but admitted to sweating while active), did not take any medicines at onset of pain and admitted that it gradually improved prior to going to bed. Recurrence of similar CP with persistent left hand numbness woke up patient around 0300 today and has been persistent since, took NTG (expired 2012 w/o relief) and repeat NTG about 1-2 hr prior to presentation with unclear improvement. Currently CP significantly improved on arrival (declined offer for any pain medicine or repeat NTG at this time). Denies any associated fevers/chills, recent illness, cough, abdominal pain, n/v, diarrhea, HA, lightheadedness/dizziness, edema. Did not miss any meds today, took ASA and all other meds before arrival.  Significant PMH of CAD since 1985 (s/p CABG x2, multiple stent placement), AAA (w/o hx rupture, stable last checked 11/5425), CHF (diastolic), DM, HTN, HLD. Followed by Cardiology (Dr. Ellyn Hack).  Past Medical History  Diagnosis Date  . History of: ST elevation myocardial infarction (STEMI) involving left circumflex coronary artery with complication 0623    PTCA-circumflex; PCI in 1991  . CAD, multiple vessel 1985-2010    Most recent cath 01/01/09: 100% Occluded LAD after SP1, patent LIMA-distal LAD with apical 90% lesion.  Cx-OM1 widely patent stent into proximal bifurcating OM1 . Follow-on Cx => 70-80% -- non-amenable PCI. Widely patent 3  overlapping stents in midRCA with only 40% RPL stenosis; SVG- OM known to be occluded.  . S/P CABG x 2 1997    LIMA-LAD, SVG-OM  . H/O unstable angina 06/2003, 12/2006,    '04: Staged Taxus DES PCI to RCA and circumflex-OM;;'08 - PCI to proximal ISR in RCA - Cypher DES  . History of nuclear stress test December 2013    LOW at Risk. Moderate region of mid to basal inferolateral scar without ischemia -- consistent with distal circumflex disease. Mild apical hypokinesis with an EF of 47%.  . Irregular heart beat     PVCs and PACs, no arrhythmia recorded  . Exertional dyspnea     Chronic baseline SOB with ambulation  . Sleep apnea     pt. states he was told to return for f/u, to be fitted for CPAp, but pt. reports that he didn't follow up  . AAA (abdominal aortic aneurysm) 08/30/12    Doppler 08/30/12 had a small amount of growth. It was a 4.2 x 4.3 cm greater in size than the previous one noted at 4 x 3.8.  . Diabetes mellitus     Not on oral medication  . Hypertension   . Dyslipidemia, goal LDL below 70   . Osteoarthritis of both knees     And back; multiple back surgeries, right knee arthroplasty and left knee arthroscopic surgery x2  . Chronic back pain      multiple surgeries; C-spine and lumbar  . Erectile dysfunction   . H/O: pneumonia February 14  . Chronic anemia     On iron supplement; history of positive guaiac - negative colonoscopy in 1996.;  Thought to be related to hemorrhoids; status post hemorrhoidectomy  . Ankle edema     Chronic   Past Surgical History  Procedure Laterality Date  . Coronary angioplasty  [id number]    1985 lateral STEMI Circumflex PTCA  . Anterior lat lumbar fusion Left 11/22/2012    Procedure: ANTERIOR LATERAL LUMBAR FUSION 1 LEVEL;  Surgeon: Eustace Moore, MD;  Location: [facility] [place] [place] [facility];  Service: Neurosurgery;  Laterality: Left;  Anterior Lateral Lumbar Fusion Lumbar Three-Four  . Lumbar percutaneous pedicle screw 1 level N/A 11/22/2012    Procedure:  LUMBAR PERCUTANEOUS PEDICLE SCREW 1 LEVEL;  Surgeon: Eustace Moore, MD;  Location: [facility];  Service: Neurosurgery;  Laterality: N/A;  Lumbar Three-Four Percutaneous Pedicle Screw, Lateral approach  . Back surgery  1979 & x 10    pt. remarks, "I have had about 10 back surgeries"  . Posterior cervical fusion/foraminotomy N/A 05/02/2013    Procedure: POSTERIOR CERVICAL FUSION/FORAMINOTOMY CERVICAL SEVEN THORACIC-ONE;  Surgeon: Eustace Moore, MD;  Location: [facility];  Service: Neurosurgery;  Laterality: N/A;  POSTERIOR CERVICAL FUSION/FORAMINOTOMY CERVICAL SEVEN THORACIC-ONE  . Coronary artery bypass graft  1990    LIMA-LAD, SVG-OM  . Cardiac catheterization  September 2004    None Occluded vein graft to OM; diffuse RCA disease in the mid vessel, 80% circumflex-OM stenosis; follow on AV groove circumflex with sequential 90% stenoses and intervening saccular dilation   . Percutaneous coronary stent intervention (pci-s)  September 2004    PCI - RCA 2 overlapping Taxus DES 2.75 mm x 32 mm and 2.75 mm x 12 mm (3.0 mm); PCI-Cx-OM1 - Taxus DES 3.0 mm x 20 mm (3.1 mm);   Marland Kitchen Percutaneous coronary stent intervention (pci-s)  March 2008    80% ISR in proximal Taxus stent in RCA -- covered proximally with Cypher DES 3.0 mm x 12 mm  . Cardiac catheterization  March 2010    4 abnormal Myoview showing apical thinning (possibly due to apical LAD 95%) : 100% Occluded LAD after SV1, distal LAD grafted via LIMA -apical 95% . Cx -OM1 w/patent stent extending into OM 1 . Follow on Cx - 70-80% - non-amenable PCI. RCA widely patent 3 overlapping stents in mRCA w/less than 40% stenosisin RPL; SVG-OM known occluded   . Cervical discectomy  04/23/10    Decompressive anterior carvical diskectomy. C4-5, C6-7  . Cervical arthrodesis  04/23/10    Anterior cervical arthrodesis, C4-5, C6-7 utilizing 7-mm PEEK interbody cage packed with local autograft & Antifuse putty at C4-5 & an 8-mm cage at C6-7.  Marland Kitchen Anterior cervical  plating  04/23/10    At C4-5 and a C6-7 utilizing two separate Biomet MaxAn plates.  . Colonoscopy  1996  . Minor hemorrhoidectomy    . Total knee arthroplasty Right   . Knee arthroscopy Left     x 2  . Nm myoview ltd  December 2013    LOW RISK. Mmoderate region of mid to basal inferolateral scar without ischemia. Mild apical hypokinesis with an EF of 47%.  . Transthoracic echocardiogram  December 2013    EF 55-60%. moderate LA dilation. Aortic Sclerosis  . Abdominal and lower extremity arterial ultrasound  08/23/2012; 10/10/2013    Normal ABIs. Nonocclusive lower extremity disease. 4.2 cm x 4.3 cm infrarenal AAA;; 4.4 cm x 4.3 cm (essentially stable)   . Cataract extraction     Family History  Problem Relation Age of Onset  . Arthritis Mother   . Diabetes Father   .  Heart disease Father   . Stroke Sister   . Hypertension Sister   . Heart disease Sister   . Diabetes Sister   . Breast cancer Sister   . Heart disease Brother   . Ulcers Brother   . Colon cancer Neg Hx    History  Substance Use Topics  . Smoking status: Former Smoker    Types: Cigarettes    Quit date: 10/05/1983  . Smokeless tobacco: Never Used  . Alcohol Use: 1.8 oz/week    3 Cans of beer per week     Comment: almost every day    Review of Systems  See above HPI  Allergies  Lisinopril; Statins; and Welchol  Home Medications   Prior to Admission medications   Medication Sig Start Date End Date Taking? Authorizing Provider  aspirin 81 MG tablet Take 81 mg by mouth daily.   Yes Historical Provider, MD  Choline Fenofibrate (TRILIPIX) 135 MG capsule Take 1 capsule (135 mg total) by mouth daily. 03/15/14  Yes Leonie Man, MD  clopidogrel (PLAVIX) 75 MG tablet Take 1 tablet (75 mg total) by mouth daily. 11/26/13  Yes Leonie Man, MD  digoxin (LANOXIN) 0.125 MG tablet Take 1 tablet (0.125 mg total) by mouth daily. 04/26/14  Yes Leonie Man, MD  ezetimibe (ZETIA) 10 MG tablet Take 1 tablet (10 mg  total) by mouth daily. 12/27/13  Yes Leonie Man, MD  ferrous sulfate 325 (65 FE) MG tablet Take 325 mg by mouth daily with breakfast.   Yes Historical Provider, MD  losartan-hydrochlorothiazide (HYZAAR) 100-12.5 MG per tablet Take 1 tablet by mouth daily.   Yes Historical Provider, MD  methocarbamol (ROBAXIN) 750 MG tablet Take 750 mg by mouth every 6 (six) hours as needed for muscle spasms.  05/21/13  Yes Historical Provider, MD  metoprolol succinate (TOPROL-XL) 50 MG 24 hr tablet Take 1 tablet (50 mg total) by mouth daily before breakfast. 01/31/14  Yes Leonie Man, MD  Multiple Vitamins-Minerals (MULTIVITAMIN WITH MINERALS) tablet Take 1 tablet by mouth daily.    Yes Historical Provider, MD  pantoprazole (PROTONIX) 40 MG tablet Take 1 tablet (40 mg total) by mouth daily. 12/12/13  Yes Leonie Man, MD  pioglitazone-metformin (ACTOPLUS MET) 15-500 MG per tablet Take 1 tablet by mouth daily.   Yes Historical Provider, MD  prednisoLONE acetate (PRED FORTE) 1 % ophthalmic suspension Place 1 drop into the left eye 4 (four) times daily.   Yes Historical Provider, MD   BP 107/83  Pulse 49  Resp 22  SpO2 100% Physical Exam  Gen - well-appearing, cooperative, NAD HEENT - NCAT, PERRL, EOMI, oropharynx clear, MMM Neck - supple, non-tender Heart - RRR, no murmurs heard Lungs - CTAB, no wheezing, crackles, or rhonchi. Normal work of breathing. Abd - soft, mild generalized epigastric / LLQ abdominal pain on palpation, non-distended, no masses, +active BS Ext - non-tender, mild b/l +1 pitting edema ankles and lower legs, peripheral pulses intact +2 b/l Skin - warm, dry, no rashes Neuro - awake, alert, oriented (person, place, year), CNs II-XII grossly intact, bilateral negative pronator drift, intact muscle strength 5/5 b/l, mild left finger numbness index, long, an ring fingers palmar aspect only w/o extension, intact b/l grip strength, finger to nose b/l intact with nml cerebellar  function  ED Course  Procedures (including critical care time) Labs Review Labs Reviewed  CBC - Abnormal; Notable for the following:    RBC 3.77 (*)    Hemoglobin  11.9 (*)    HCT 36.3 (*)    All other components within normal limits  BASIC METABOLIC PANEL - Abnormal; Notable for the following:    Glucose, Bld 152 (*)    Creatinine, Ser 1.60 (*)    GFR calc non Af Amer 40 (*)    GFR calc Af Amer 47 (*)    All other components within normal limits  Randolm Idol, ED    Imaging Review Dg Chest 2 View  05/14/2014   CLINICAL DATA:  Mid sternal chest pain.  History of cardiac disease.  EXAM: CHEST  2 VIEW  COMPARISON:  11/25/2012 and 11/24/2012  FINDINGS: Postsurgical changes consistent with a CABG. Surgical fusion hardware in the lower cervical spine. Lungs are clear bilaterally. Stable appearance of the heart and mediastinum. Degenerative changes in the lower thoracic spine. No pleural effusions and no evidence for pulmonary edema.  IMPRESSION: No active cardiopulmonary disease.   Electronically Signed   By: Markus Daft M.D.   On: 05/14/2014 11:14     EKG Interpretation   Date/Time:  Tuesday May 14 2014 09:08:23 EDT Ventricular Rate:  61 PR Interval:  230 QRS Duration: 110 QT Interval:  390 QTC Calculation: 392 R Axis:   9 Text Interpretation:  Sinus rhythm with 1st degree A-V block  Inferior-posterior infarct , age undetermined RSR prime No significant  change since last tracing Confirmed by [facility]  MD, Jenny Reichmann ([id number]) on  05/14/2014 9:56:06 AM      MDM   Final diagnoses:  Unstable angina   20 yr M with complex cardiac PMH including CAD since 1985 (s/p CABG x2, multiple stent placement), AAA (w/o hx rupture, stable last checked 03/7736), CHF (diastolic), DM, HTN, HLD presented with gradual onset left-sided CP and associated left hand numbness, gradual improving pain (duration > 12 hrs currently), unclear if improvement s/p NTG this AM, no new associated symptoms or  changes. On arrival, stable vitals, well-appearing, without acute distress. Initial EKG reviewed with NSR with Type 1 AV block, noted old inferior-posterior infarct with q waves (consistent with prior EKG) and no new change without acute ST-T wave changes).  Proceed with routine CP work-up and labs. POCT-Troponin, CXR, EKG as above, CBC, BMET.  UPDATE @ 0945 - Patient remains with stable vitals, improved CP, decline pain medicine or repeat NTG at this time. Reviewed labs with POCT-Trop 0.00 (negative), BMET remarkable only for elevated Cr 1.60 (similar to b/l 1.4-1.5) otherwise stable electrolytes, CBC with nml WBC, Hgb 11.9 (mild low, stable from previous 12-13). Consult Cardiology for evaluation and recommendations in patient with complex cardiac history, now with negative POCT-Trop and stable improved CP.  UPDATE @ 1525 - Patient is chest pain free, reportedly 0/10 currently. Cardiology (Dr. Debara Pickett) has seen patient in ED, decision made to admit patient to Cardiology primary service for CP with concern for unstable angina in setting of prior MIs s/p CABG. Cardiology plans to monitor troponins and arrange cardiac cath tomorrow 05/15/14.  Nobie Putnam, DO 05/14/14 1538

## 2014-05-15 ENCOUNTER — Encounter (HOSPITAL_COMMUNITY)
Admission: EM | Disposition: A | Discharge status: Home / Self Care | Payer: Self-pay | Source: Home / Self Care | Attending: Internal Medicine

## 2014-05-15 DIAGNOSIS — Z833 Family history of diabetes mellitus: Secondary | ICD-10-CM | POA: Diagnosis not present

## 2014-05-15 DIAGNOSIS — Z8249 Family history of ischemic heart disease and other diseases of the circulatory system: Secondary | ICD-10-CM | POA: Diagnosis not present

## 2014-05-15 DIAGNOSIS — E785 Hyperlipidemia, unspecified: Secondary | ICD-10-CM | POA: Diagnosis not present

## 2014-05-15 DIAGNOSIS — Z79899 Other long term (current) drug therapy: Secondary | ICD-10-CM | POA: Diagnosis not present

## 2014-05-15 DIAGNOSIS — R079 Chest pain, unspecified: Secondary | ICD-10-CM | POA: Diagnosis present

## 2014-05-15 DIAGNOSIS — E1169 Type 2 diabetes mellitus with other specified complication: Secondary | ICD-10-CM | POA: Diagnosis not present

## 2014-05-15 DIAGNOSIS — K219 Gastro-esophageal reflux disease without esophagitis: Secondary | ICD-10-CM | POA: Diagnosis present

## 2014-05-15 DIAGNOSIS — Z7902 Long term (current) use of antithrombotics/antiplatelets: Secondary | ICD-10-CM | POA: Diagnosis not present

## 2014-05-15 DIAGNOSIS — Y849 Medical procedure, unspecified as the cause of abnormal reaction of the patient, or of later complication, without mention of misadventure at the time of the procedure: Secondary | ICD-10-CM | POA: Diagnosis present

## 2014-05-15 DIAGNOSIS — I714 Abdominal aortic aneurysm, without rupture, unspecified: Secondary | ICD-10-CM | POA: Diagnosis present

## 2014-05-15 DIAGNOSIS — E119 Type 2 diabetes mellitus without complications: Secondary | ICD-10-CM | POA: Diagnosis present

## 2014-05-15 DIAGNOSIS — Z6830 Body mass index (BMI) 30.0-30.9, adult: Secondary | ICD-10-CM | POA: Diagnosis not present

## 2014-05-15 DIAGNOSIS — I5032 Chronic diastolic (congestive) heart failure: Secondary | ICD-10-CM | POA: Diagnosis not present

## 2014-05-15 DIAGNOSIS — I129 Hypertensive chronic kidney disease with stage 1 through stage 4 chronic kidney disease, or unspecified chronic kidney disease: Secondary | ICD-10-CM | POA: Diagnosis present

## 2014-05-15 DIAGNOSIS — I498 Other specified cardiac arrhythmias: Secondary | ICD-10-CM | POA: Diagnosis present

## 2014-05-15 DIAGNOSIS — Z96659 Presence of unspecified artificial knee joint: Secondary | ICD-10-CM | POA: Diagnosis not present

## 2014-05-15 DIAGNOSIS — I2510 Atherosclerotic heart disease of native coronary artery without angina pectoris: Secondary | ICD-10-CM | POA: Diagnosis not present

## 2014-05-15 DIAGNOSIS — E669 Obesity, unspecified: Secondary | ICD-10-CM | POA: Diagnosis present

## 2014-05-15 DIAGNOSIS — Z87891 Personal history of nicotine dependence: Secondary | ICD-10-CM | POA: Diagnosis not present

## 2014-05-15 DIAGNOSIS — I252 Old myocardial infarction: Secondary | ICD-10-CM | POA: Diagnosis not present

## 2014-05-15 DIAGNOSIS — T82897A Other specified complication of cardiac prosthetic devices, implants and grafts, initial encounter: Secondary | ICD-10-CM | POA: Diagnosis not present

## 2014-05-15 DIAGNOSIS — E118 Type 2 diabetes mellitus with unspecified complications: Secondary | ICD-10-CM | POA: Diagnosis not present

## 2014-05-15 DIAGNOSIS — I2582 Chronic total occlusion of coronary artery: Secondary | ICD-10-CM | POA: Diagnosis not present

## 2014-05-15 DIAGNOSIS — N189 Chronic kidney disease, unspecified: Secondary | ICD-10-CM | POA: Diagnosis present

## 2014-05-15 DIAGNOSIS — Z981 Arthrodesis status: Secondary | ICD-10-CM | POA: Diagnosis not present

## 2014-05-15 DIAGNOSIS — I200 Unstable angina: Secondary | ICD-10-CM | POA: Diagnosis not present

## 2014-05-15 DIAGNOSIS — G4733 Obstructive sleep apnea (adult) (pediatric): Secondary | ICD-10-CM | POA: Diagnosis present

## 2014-05-15 DIAGNOSIS — Z7982 Long term (current) use of aspirin: Secondary | ICD-10-CM | POA: Diagnosis not present

## 2014-05-15 HISTORY — PX: LEFT HEART CATHETERIZATION WITH CORONARY ANGIOGRAM: SHX5451

## 2014-05-15 LAB — CBC
HCT: 36 % — ABNORMAL LOW (ref 39.0–52.0)
Hemoglobin: 12.1 g/dL — ABNORMAL LOW (ref 13.0–17.0)
MCH: 31.8 pg (ref 26.0–34.0)
MCHC: 33.6 g/dL (ref 30.0–36.0)
MCV: 94.7 fL (ref 78.0–100.0)
PLATELETS: 175 10*3/uL (ref 150–400)
RBC: 3.8 MIL/uL — ABNORMAL LOW (ref 4.22–5.81)
RDW: 14.2 % (ref 11.5–15.5)
WBC: 5.6 10*3/uL (ref 4.0–10.5)

## 2014-05-15 LAB — LIPID PANEL
Cholesterol: 212 mg/dL — ABNORMAL HIGH (ref 0–200)
HDL: 36 mg/dL — ABNORMAL LOW (ref >39)
LDL CALC: 124 mg/dL — AB (ref 0–99)
Total CHOL/HDL Ratio: 5.9 RATIO
Triglycerides: 262 mg/dL — ABNORMAL HIGH (ref <150)
VLDL: 52 mg/dL — AB (ref 0–40)

## 2014-05-15 LAB — BASIC METABOLIC PANEL
Anion gap: 15 (ref 5–15)
BUN: 23 mg/dL (ref 6–23)
CALCIUM: 9.2 mg/dL (ref 8.4–10.5)
CO2: 23 meq/L (ref 19–32)
CREATININE: 1.45 mg/dL — AB (ref 0.50–1.35)
Chloride: 102 mEq/L (ref 96–112)
GFR calc Af Amer: 52 mL/min — ABNORMAL LOW (ref >90)
GFR calc non Af Amer: 45 mL/min — ABNORMAL LOW (ref >90)
GLUCOSE: 147 mg/dL — AB (ref 70–99)
Potassium: 4.4 mEq/L (ref 3.7–5.3)
Sodium: 140 mEq/L (ref 137–147)

## 2014-05-15 LAB — HEPARIN LEVEL (UNFRACTIONATED)
Heparin Unfractionated: 0.19 IU/mL — ABNORMAL LOW (ref 0.30–0.70)
Heparin Unfractionated: 0.21 IU/mL — ABNORMAL LOW (ref 0.30–0.70)

## 2014-05-15 LAB — PROTIME-INR
INR: 1.09 (ref 0.00–1.49)
Prothrombin Time: 14.1 seconds (ref 11.6–15.2)

## 2014-05-15 LAB — PLATELET INHIBITION P2Y12: PLATELET FUNCTION P2Y12: 111 [PRU] — AB (ref 194–418)

## 2014-05-15 LAB — TROPONIN I: Troponin I: <0.3 ng/mL (ref <0.30)

## 2014-05-15 SURGERY — LEFT HEART CATHETERIZATION WITH CORONARY ANGIOGRAM
Anesthesia: LOCAL

## 2014-05-15 MED ORDER — HEPARIN BOLUS VIA INFUSION
2000.0000 [IU] | Freq: Once | INTRAVENOUS | Status: AC
  Administered 2014-05-15: 2000 [IU] via INTRAVENOUS
  Filled 2014-05-15: qty 2000

## 2014-05-15 MED ORDER — VERAPAMIL HCL 2.5 MG/ML IV SOLN
INTRAVENOUS | Status: AC
  Filled 2014-05-15: qty 2

## 2014-05-15 MED ORDER — SODIUM CHLORIDE 0.9 % IV SOLN
INTRAVENOUS | Status: AC
  Administered 2014-05-16: 03:00:00 via INTRAVENOUS

## 2014-05-15 MED ORDER — MIDAZOLAM HCL 2 MG/2ML IJ SOLN
INTRAMUSCULAR | Status: AC
  Filled 2014-05-15: qty 2

## 2014-05-15 MED ORDER — LIDOCAINE HCL (PF) 1 % IJ SOLN
INTRAMUSCULAR | Status: AC
  Filled 2014-05-15: qty 30

## 2014-05-15 MED ORDER — FENTANYL CITRATE 0.05 MG/ML IJ SOLN
INTRAMUSCULAR | Status: AC
  Filled 2014-05-15: qty 2

## 2014-05-15 MED ORDER — HEPARIN (PORCINE) IN NACL 2-0.9 UNIT/ML-% IJ SOLN
INTRAMUSCULAR | Status: AC
  Filled 2014-05-15: qty 1000

## 2014-05-15 MED ORDER — HEPARIN (PORCINE) IN NACL 100-0.45 UNIT/ML-% IJ SOLN
1350.0000 [IU]/h | INTRAMUSCULAR | Status: DC
  Administered 2014-05-16: 1350 [IU]/h via INTRAVENOUS
  Filled 2014-05-15 (×3): qty 250

## 2014-05-15 MED ORDER — HEPARIN SODIUM (PORCINE) 1000 UNIT/ML IJ SOLN
INTRAMUSCULAR | Status: AC
  Filled 2014-05-15: qty 1

## 2014-05-15 MED ORDER — NITROGLYCERIN 0.2 MG/ML ON CALL CATH LAB
INTRAVENOUS | Status: AC
  Filled 2014-05-15: qty 1

## 2014-05-15 NOTE — H&P (View-Only) (Signed)
SUBJECTIVE:  No compliants  OBJECTIVE:   Vitals:   Filed Vitals:   05/15/14 0000 05/15/14 0300 05/15/14 0500 05/15/14 0748  BP: 149/62 125/55 140/68 153/71  Pulse: 55 52 56 73  Temp:  97.4 F (36.3 C)  97.7 F (36.5 C)  TempSrc:  Oral  Oral  Resp: 22 21 15 19   Height:      Weight:      SpO2: 98% 98% 98% 99%   I&O's:   Intake/Output Summary (Last 24 hours) at 05/15/14 1002 Last data filed at 05/15/14 0636  Gross per 24 hour  Intake  395.5 ml  Output    725 ml  Net -329.5 ml   TELEMETRY: Reviewed telemetry pt in NSR:     PHYSICAL EXAM General: Well developed, well nourished, in no acute distress Head: Eyes PERRLA, No xanthomas.   Normal cephalic and atramatic  Lungs:   Clear bilaterally to auscultation and percussion. Heart:   HRRR S1 S2 Pulses are 2+ & equal. Abdomen: Bowel sounds are positive, abdomen soft and non-tender without masses  Extremities:   No clubbing, cyanosis or edema.  DP +1 Neuro: Alert and oriented X 3. Psych:  Good affect, responds appropriately   LABS: Basic Metabolic Panel:  Recent Labs  05/14/14 0930 05/15/14 0246  NA 140 140  K 4.8 4.4  CL 104 102  CO2 24 23  GLUCOSE 152* 147*  BUN 23 23  CREATININE 1.60* 1.45*  CALCIUM 9.0 9.2   Liver Function Tests: No results found for this basename: AST, ALT, ALKPHOS, BILITOT, PROT, ALBUMIN,  in the last 72 hours No results found for this basename: LIPASE, AMYLASE,  in the last 72 hours CBC:  Recent Labs  05/14/14 0930 05/15/14 0246  WBC 4.7 5.6  HGB 11.9* 12.1*  HCT 36.3* 36.0*  MCV 96.3 94.7  PLT 167 175   Cardiac Enzymes:  Recent Labs  05/14/14 2145 05/15/14 0246 05/15/14 0820  TROPONINI <0.30 <0.30 <0.30   BNP: No components found with this basename: POCBNP,  D-Dimer: No results found for this basename: DDIMER,  in the last 72 hours Hemoglobin A1C: No results found for this basename: HGBA1C,  in the last 72 hours Fasting Lipid Panel:  Recent Labs  05/15/14 0246   CHOL 212*  HDL 36*  LDLCALC 124*  TRIG 262*  CHOLHDL 5.9   Thyroid Function Tests: No results found for this basename: TSH, T4TOTAL, FREET3, T3FREE, THYROIDAB,  in the last 72 hours Anemia Panel: No results found for this basename: VITAMINB12, FOLATE, FERRITIN, TIBC, IRON, RETICCTPCT,  in the last 72 hours Coag Panel:   Lab Results  Component Value Date   INR 1.09 05/15/2014   INR 1.03 05/01/2013   INR 0.95 11/15/2012    RADIOLOGY: Dg Chest 2 View  05/14/2014   CLINICAL DATA:  Mid sternal chest pain.  History of cardiac disease.  EXAM: CHEST  2 VIEW  COMPARISON:  11/25/2012 and 11/24/2012  FINDINGS: Postsurgical changes consistent with a CABG. Surgical fusion hardware in the lower cervical spine. Lungs are clear bilaterally. Stable appearance of the heart and mediastinum. Degenerative changes in the lower thoracic spine. No pleural effusions and no evidence for pulmonary edema.  IMPRESSION: No active cardiopulmonary disease.   Electronically Signed   By: Markus Daft M.D.   On: 05/14/2014 11:14   ASSESSMENT AND PLAN  1. CP concerning for unstable angina - sx similar to previous MI - he has ruled out for MI by serial troponins. -  will plan for cardiac cath today 2. coronary artery disease status post CABG x2 in 1997  - last cath 01/01/2009 which revealed EF 50%, left circumflex 70-80% narrowing, however the segment was followed by a saccular aneurysmal segment and is not amenable to PCI intervention, patent LIMA to LAD, SVG to OM was chronically occluded, widely patent stents in native OM and RCA  - stress test on 09/22/2012 was low risk nuclear stress test demonstrated a moderate regional mid to basal inferior to inferolateral scar with no significant ischemia, with EF 47%  - continue ASA/Plavix/IV Heparin gtt/BB 3. h/o diastolic heart failure  4. Hypertension - controlled - continue ARB/BB - Hold diuretic for cath 5. Diabetes  6. Hyperlipidemia - LDL elevated but he is statin intolerant  of all statins.  Would consider PSK 9 drug once discharged. - continue Zetia/fenofibrate 7. obstructive sleep apnea  8. chronic kidney disease  9. AAA, stable in size, 4.4x4.4cm in distal aorta June 2015  10. Bradycardia: digoxin stopped   Sueanne Margarita, MD  05/15/2014  10:02 AM

## 2014-05-15 NOTE — Progress Notes (Addendum)
SUBJECTIVE:  No compliants  OBJECTIVE:   Vitals:   Filed Vitals:   05/15/14 0000 05/15/14 0300 05/15/14 0500 05/15/14 0748  BP: 149/62 125/55 140/68 153/71  Pulse: 55 52 56 73  Temp:  97.4 F (36.3 C)  97.7 F (36.5 C)  TempSrc:  Oral  Oral  Resp: 22 21 15 19   Height:      Weight:      SpO2: 98% 98% 98% 99%   I&O's:   Intake/Output Summary (Last 24 hours) at 05/15/14 1002 Last data filed at 05/15/14 0636  Gross per 24 hour  Intake  395.5 ml  Output    725 ml  Net -329.5 ml   TELEMETRY: Reviewed telemetry pt in NSR:     PHYSICAL EXAM General: Well developed, well nourished, in no acute distress Head: Eyes PERRLA, No xanthomas.   Normal cephalic and atramatic  Lungs:   Clear bilaterally to auscultation and percussion. Heart:   HRRR S1 S2 Pulses are 2+ & equal. Abdomen: Bowel sounds are positive, abdomen soft and non-tender without masses  Extremities:   No clubbing, cyanosis or edema.  DP +1 Neuro: Alert and oriented X 3. Psych:  Good affect, responds appropriately   LABS: Basic Metabolic Panel:  Recent Labs  05/14/14 0930 05/15/14 0246  NA 140 140  K 4.8 4.4  CL 104 102  CO2 24 23  GLUCOSE 152* 147*  BUN 23 23  CREATININE 1.60* 1.45*  CALCIUM 9.0 9.2   Liver Function Tests: No results found for this basename: AST, ALT, ALKPHOS, BILITOT, PROT, ALBUMIN,  in the last 72 hours No results found for this basename: LIPASE, AMYLASE,  in the last 72 hours CBC:  Recent Labs  05/14/14 0930 05/15/14 0246  WBC 4.7 5.6  HGB 11.9* 12.1*  HCT 36.3* 36.0*  MCV 96.3 94.7  PLT 167 175   Cardiac Enzymes:  Recent Labs  05/14/14 2145 05/15/14 0246 05/15/14 0820  TROPONINI <0.30 <0.30 <0.30   BNP: No components found with this basename: POCBNP,  D-Dimer: No results found for this basename: DDIMER,  in the last 72 hours Hemoglobin A1C: No results found for this basename: HGBA1C,  in the last 72 hours Fasting Lipid Panel:  Recent Labs  05/15/14 0246   CHOL 212*  HDL 36*  LDLCALC 124*  TRIG 262*  CHOLHDL 5.9   Thyroid Function Tests: No results found for this basename: TSH, T4TOTAL, FREET3, T3FREE, THYROIDAB,  in the last 72 hours Anemia Panel: No results found for this basename: VITAMINB12, FOLATE, FERRITIN, TIBC, IRON, RETICCTPCT,  in the last 72 hours Coag Panel:   Lab Results  Component Value Date   INR 1.09 05/15/2014   INR 1.03 05/01/2013   INR 0.95 11/15/2012    RADIOLOGY: Dg Chest 2 View  05/14/2014   CLINICAL DATA:  Mid sternal chest pain.  History of cardiac disease.  EXAM: CHEST  2 VIEW  COMPARISON:  11/25/2012 and 11/24/2012  FINDINGS: Postsurgical changes consistent with a CABG. Surgical fusion hardware in the lower cervical spine. Lungs are clear bilaterally. Stable appearance of the heart and mediastinum. Degenerative changes in the lower thoracic spine. No pleural effusions and no evidence for pulmonary edema.  IMPRESSION: No active cardiopulmonary disease.   Electronically Signed   By: Markus Daft M.D.   On: 05/14/2014 11:14   ASSESSMENT AND PLAN  1. CP concerning for unstable angina - sx similar to previous MI - he has ruled out for MI by serial troponins. -  will plan for cardiac cath today 2. coronary artery disease status post CABG x2 in 1997  - last cath 01/01/2009 which revealed EF 50%, left circumflex 70-80% narrowing, however the segment was followed by a saccular aneurysmal segment and is not amenable to PCI intervention, patent LIMA to LAD, SVG to OM was chronically occluded, widely patent stents in native OM and RCA  - stress test on 09/22/2012 was low risk nuclear stress test demonstrated a moderate regional mid to basal inferior to inferolateral scar with no significant ischemia, with EF 47%  - continue ASA/Plavix/IV Heparin gtt/BB 3. h/o diastolic heart failure  4. Hypertension - controlled - continue ARB/BB - Hold diuretic for cath 5. Diabetes  6. Hyperlipidemia - LDL elevated but he is statin intolerant  of all statins.  Would consider PSK 9 drug once discharged. - continue Zetia/fenofibrate 7. obstructive sleep apnea  8. chronic kidney disease  9. AAA, stable in size, 4.4x4.4cm in distal aorta June 2015  10. Bradycardia: digoxin stopped   Sueanne Margarita, MD  05/15/2014  10:02 AM

## 2014-05-15 NOTE — Progress Notes (Signed)
[facility] for heparin Indication: chest pain/ACS  Allergies  Allergen Reactions  . Lisinopril Cough    Rxn: unknown  . Statins Other (See Comments)    Muscle ache  . Welchol [Colesevelam Hcl] Itching    Patient Measurements: Ht= 5' 9'' Wt= 97.7kg (215lbs reported by patient) IBW= 70.7 Heparin dosing wt: 92kg  Vital Signs: Temp: 97.8 F (36.6 C) (08/11 2314) Temp src: Oral (08/11 2314) BP: 149/62 mmHg (08/12 0000) Pulse Rate: 55 (08/12 0000)  Labs:  Recent Labs  05/14/14 0930 05/14/14 2145 05/14/14 2340 05/15/14 0246  HGB 11.9*  --   --   --   HCT 36.3*  --   --   --   PLT 167  --   --   --   HEPARINUNFRC  --   --  0.19* 0.21*  CREATININE 1.60*  --   --   --   TROPONINI  --  <0.30  --   --     Estimated Creatinine Clearance: 45.3 ml/min (by C-G formula based on Cr of 1.6).  Assessment: 78 y.o. male with chest pain for heparin   Goal of Therapy:  Heparin level 0.3-0.7 units/ml Monitor platelets by anticoagulation protocol: Yes   Plan:  Heparin 2000 units IV bolus, then increase heparin 1350 units/hr F/U after cath  Phillis Knack, PharmD, BCPS   05/15/2014 4:33 AM

## 2014-05-15 NOTE — CV Procedure (Signed)
Left Heart Catheterization with Coronary and bypass Angiography  Report  [name]  78 y.o.  male Mar 02, 1936  Procedure Date: 05/15/2014 Referring Physician: [name] Him, M.D. Primary Cardiologist:: Glenetta Hew, M.D.  INDICATIONS: Unstable angina  PROCEDURE: 1. Left heart catheterization; 2. Coronary angiography; 3. Left ventricular hemodynamic recordings; 4. Bypass graft angiography  CONSENT:  The risks, benefits, and details of the procedure were explained in detail to the patient. Risks including death, stroke, heart attack, kidney injury, allergy, limb ischemia, bleeding and radiation injury were discussed.  The patient verbalized understanding and wanted to proceed.  Informed written consent was obtained.  PROCEDURE TECHNIQUE:  After Xylocaine anesthesia a 5 French Slender sheath was placed in the left radial artery with an angiocath and the modified Seldinger technique.  Coronary angiography was done using a 5 F JR 4, JL 3.5, JL 4, and IMA diagnostic catheterS.  Left ventriculography was not performed to reduce contrast exposure and avoid potential kidney injury.  We review the digital images in the cath suite. The lesion in the proximal circumflex distal to the stent margin is new and likely hemodynamically significant. The proximal in-stent restenosis in the right coronary appears to be intermediate and likely not hemodynamically significant. Given the patient's renal impairment, we did not add PCI on to this procedure and have decided to wait 24-48 hours before intervention.   CONTRAST:  Total of 110 cc.  COMPLICATIONS:  None   HEMODYNAMICS:  Aortic pressure 144/63 mmHg; LV pressure 144/8 mmHg; LVEDP 16 mm mercury  ANGIOGRAPHIC DATA:   The left main coronary artery is moderately calcified but widely patent.  The left anterior descending artery is totally occluded after the first septal perforator.  The left circumflex artery is contains stents. The first  obtuse marginal contains a focal area of lucency just beyond the stent margin in the proximal vessel. This is new and obstructs the vessel by 70-85% depending upon the view. The continuation of the circumflex beyond the first obtuse marginal contains 90% narrowing on either side of the sacral aneurysm. The vessel is small and not amenable to PCI.  The right coronary artery is patent. Multiple overlapping stents are noted from proximal to distal RCA. The very proximal margin of the stented region contains segmental 60-75% stenosis. The mid and distal vessel is widely patent.Marland Kitchen  BYPASS GRAFT ANGIOGRAPHY: Saphenous vein graft to the obtuse marginal is totally occluded (previously demonstrated)  The left internal mammary graft to the LAD is widely patent  LEFT VENTRICULOGRAM:  Left ventricular angiogram was not performed given the patient's chronic kidney disease. We did measure pressures which were normal.   IMPRESSIONS:  1. Significant stenosis in the first obtuse marginal just distal to the stent margin. I believe this represents in-stent restenosis. The region of obstruction is in the 75-80% range angiographically, therefore not severe enough to cause pain at rest. The continuation of the circumflex beyond the first obtuse marginal is diffusely diseased and not amenable to PCI 2. Moderate in-stent restenosis in the proximal right coronary, 50-70% narrowing 3. Patent LIMA to the LAD 4. Left ventriculogram was not performed.  RECOMMENDATION:  None of the patient's lesions seem severe enough to cause chest discomfort at rest. I do believe the circumflex stenosis is hemodynamically significant and the in-stent restenosis in the right coronary is borderline. We did not proceed with intervention given the patient's chronic kidney disease and the amount of contrast used for the diagnostic procedure.  I will check  a P2 Y12 and basic metabolic panel in the a.m. We could potentially perform PCI on the obtuse  marginal on Thursday afternoon or Friday morning (preferable) depending upon the patient's renal function. We will do FFR on the RCA at the same time and if significant, perform PCI .

## 2014-05-15 NOTE — Progress Notes (Signed)
ANTICOAGULATION CONSULT NOTE - Follow Up Consult  Pharmacy Consult for Heparin Indication: chest pain/ACS - awaiting PCI  Allergies  Allergen Reactions  . Lisinopril Cough    Rxn: unknown  . Statins Other (See Comments)    Muscle ache  . Welchol [Colesevelam Hcl] Itching    Patient Measurements: Height: 5' 8.9" (175 cm) Weight: 216 lb 7.9 oz (98.2 kg) (standing scale) IBW/kg (Calculated) : 70.47 Heparin Dosing Weight: 91.1 kg  Vital Signs: Temp: 97.3 F (36.3 C) (08/12 1651) Temp src: Oral (08/12 1651) BP: 152/94 mmHg (08/12 1700) Pulse Rate: 56 (08/12 1835)  Labs:  Recent Labs  05/14/14 0930 05/14/14 2145 05/14/14 2340 05/15/14 0246 05/15/14 0820  HGB 11.9*  --   --  12.1*  --   HCT 36.3*  --   --  36.0*  --   PLT 167  --   --  175  --   LABPROT  --   --   --   --  14.1  INR  --   --   --   --  1.09  HEPARINUNFRC  --   --  0.19* 0.21*  --   CREATININE 1.60*  --   --  1.45*  --   TROPONINI  --  <0.30  --  <0.30 <0.30    Estimated Creatinine Clearance: 50 ml/min (by C-G formula based on Cr of 1.45).   Assessment: 75 YOM with cardiac history significant for prior MI/CABG who presented on 8/11 with CP and was started on a heparin drip for anticoagulation. The patient was found via cath on 8/12 to have possible in-stent stenosis in the first obtuse marginal and proximal right coronary. Given the patient's hx CKD - PCI was not done due to the amount of contrast used for diagnostics. Plans are to consider PCI on either Thursday afternoon (8/13) or Friday AM (8/14) - pharmacy has been consulted to resume heparin 8 hours post sheath removal. Per the RN, the radial sheath was removed at [place].   Goal of Therapy:  Heparin level 0.3-0.7 units/ml Monitor platelets by anticoagulation protocol: Yes   Plan:  1. Resume heparin at 1350 units/hr (13.5 ml/hr) at 0330 on 8/13 2. Will continue to monitor for any signs/symptoms of bleeding and will follow up with heparin level in 8  hours from drip restarting  Alycia Rossetti, PharmD, BCPS Clinical Pharmacist Pager: [phone] 05/15/2014 8:06 PM

## 2014-05-15 NOTE — Interval H&P Note (Signed)
Cath Lab Visit (complete for each Cath Lab visit)  Clinical Evaluation Leading to the Procedure:   ACS: Yes.    Non-ACS:    Anginal Classification: CCS III  Anti-ischemic medical therapy: Maximal Therapy (2 or more classes of medications)  Non-Invasive Test Results: No non-invasive testing performed  Prior CABG: Previous CABG      History and Physical Interval Note:  05/15/2014 6:36 PM  [name]  has presented today for surgery, with the diagnosis of cp  The various methods of treatment have been discussed with the patient and family. After consideration of risks, benefits and other options for treatment, the patient has consented to  Procedure(s): LEFT HEART CATHETERIZATION WITH CORONARY ANGIOGRAM (N/A) as a surgical intervention .  The patient's history has been reviewed, patient examined, no change in status, stable for surgery.  I have reviewed the patient's chart and labs.  Questions were answered to the patient's satisfaction.     Sinclair Grooms

## 2014-05-15 NOTE — Progress Notes (Signed)
UR Completed Quintavius Niebuhr Graves-Bigelow, RN,BSN [phone]  

## 2014-05-16 ENCOUNTER — Encounter (HOSPITAL_COMMUNITY)
Admission: EM | Disposition: A | Discharge status: Home / Self Care | Payer: Self-pay | Source: Home / Self Care | Attending: Internal Medicine

## 2014-05-16 DIAGNOSIS — I5032 Chronic diastolic (congestive) heart failure: Secondary | ICD-10-CM

## 2014-05-16 DIAGNOSIS — I509 Heart failure, unspecified: Secondary | ICD-10-CM

## 2014-05-16 LAB — BASIC METABOLIC PANEL
Anion gap: 11 (ref 5–15)
Anion gap: 12 (ref 5–15)
BUN: 16 mg/dL (ref 6–23)
BUN: 16 mg/dL (ref 6–23)
CALCIUM: 8.8 mg/dL (ref 8.4–10.5)
CALCIUM: 9.3 mg/dL (ref 8.4–10.5)
CO2: 23 mEq/L (ref 19–32)
CO2: 22 mEq/L (ref 19–32)
CREATININE: 1.37 mg/dL — AB (ref 0.50–1.35)
Chloride: 107 mEq/L (ref 96–112)
Chloride: 105 mEq/L (ref 96–112)
Creatinine, Ser: 1.52 mg/dL — ABNORMAL HIGH (ref 0.50–1.35)
GFR calc Af Amer: 56 mL/min — ABNORMAL LOW (ref >90)
GFR calc Af Amer: 50 mL/min — ABNORMAL LOW (ref >90)
GFR calc non Af Amer: 49 mL/min — ABNORMAL LOW (ref >90)
GFR, EST NON AFRICAN AMERICAN: 43 mL/min — AB (ref >90)
Glucose, Bld: 109 mg/dL — ABNORMAL HIGH (ref 70–99)
Glucose, Bld: 146 mg/dL — ABNORMAL HIGH (ref 70–99)
Potassium: 4.4 mEq/L (ref 3.7–5.3)
Potassium: 4.7 mEq/L (ref 3.7–5.3)
SODIUM: 139 meq/L (ref 137–147)
Sodium: 141 mEq/L (ref 137–147)

## 2014-05-16 LAB — CBC
HCT: 36.1 % — ABNORMAL LOW (ref 39.0–52.0)
HCT: 38.7 % — ABNORMAL LOW (ref 39.0–52.0)
Hemoglobin: 11.8 g/dL — ABNORMAL LOW (ref 13.0–17.0)
Hemoglobin: 12.7 g/dL — ABNORMAL LOW (ref 13.0–17.0)
MCH: 31 pg (ref 26.0–34.0)
MCH: 31.4 pg (ref 26.0–34.0)
MCHC: 32.7 g/dL (ref 30.0–36.0)
MCHC: 32.8 g/dL (ref 30.0–36.0)
MCV: 94.8 fL (ref 78.0–100.0)
MCV: 95.6 fL (ref 78.0–100.0)
Platelets: 172 10*3/uL (ref 150–400)
Platelets: 182 10*3/uL (ref 150–400)
RBC: 3.81 MIL/uL — ABNORMAL LOW (ref 4.22–5.81)
RBC: 4.05 MIL/uL — ABNORMAL LOW (ref 4.22–5.81)
RDW: 14.1 % (ref 11.5–15.5)
RDW: 14.3 % (ref 11.5–15.5)
WBC: 5 10*3/uL (ref 4.0–10.5)
WBC: 5.6 10*3/uL (ref 4.0–10.5)

## 2014-05-16 LAB — HEPARIN LEVEL (UNFRACTIONATED)
HEPARIN UNFRACTIONATED: 0.61 [IU]/mL (ref 0.30–0.70)
Heparin Unfractionated: 0.22 IU/mL — ABNORMAL LOW (ref 0.30–0.70)

## 2014-05-16 LAB — PROTIME-INR
INR: 1.06 (ref 0.00–1.49)
PROTHROMBIN TIME: 13.8 s (ref 11.6–15.2)

## 2014-05-16 LAB — PLATELET INHIBITION P2Y12: PLATELET FUNCTION P2Y12: 125 [PRU] — AB (ref 194–418)

## 2014-05-16 SURGERY — PERCUTANEOUS CORONARY STENT INTERVENTION (PCI-S)
Anesthesia: LOCAL

## 2014-05-16 MED ORDER — ASPIRIN 81 MG PO CHEW
81.0000 mg | CHEWABLE_TABLET | ORAL | Status: AC
  Administered 2014-05-17: 81 mg via ORAL
  Filled 2014-05-16: qty 1

## 2014-05-16 MED ORDER — SODIUM CHLORIDE 0.9 % IJ SOLN: 3.0000 mL | INTRAMUSCULAR | Status: DC | PRN

## 2014-05-16 MED ORDER — AMLODIPINE BESYLATE 5 MG PO TABS
5.0000 mg | ORAL_TABLET | Freq: Every day | ORAL | Status: DC
  Administered 2014-05-17 – 2014-05-18 (×2): 5 mg via ORAL
  Filled 2014-05-16 (×3): qty 1

## 2014-05-16 MED ORDER — SODIUM CHLORIDE 0.9 % IV SOLN: 250.0000 mL | INTRAVENOUS | Status: DC | PRN

## 2014-05-16 MED ORDER — ASPIRIN EC 81 MG PO TBEC
81.0000 mg | DELAYED_RELEASE_TABLET | Freq: Every day | ORAL | Status: DC
  Administered 2014-05-18: 81 mg via ORAL
  Filled 2014-05-16: qty 1

## 2014-05-16 MED ORDER — HEPARIN (PORCINE) IN NACL 100-0.45 UNIT/ML-% IJ SOLN
1650.0000 [IU]/h | INTRAMUSCULAR | Status: DC
  Administered 2014-05-16: 1650 [IU]/h via INTRAVENOUS
  Filled 2014-05-16 (×2): qty 250

## 2014-05-16 MED ORDER — SODIUM CHLORIDE 0.9 % IV SOLN: INTRAVENOUS | Status: DC

## 2014-05-16 MED ORDER — SODIUM CHLORIDE 0.9 % IJ SOLN
3.0000 mL | Freq: Two times a day (BID) | INTRAMUSCULAR | Status: DC
  Administered 2014-05-16: 3 mL via INTRAVENOUS

## 2014-05-16 MED ORDER — HEPARIN (PORCINE) IN NACL 100-0.45 UNIT/ML-% IJ SOLN: 1600.0000 [IU]/h | INTRAMUSCULAR | Status: DC

## 2014-05-16 NOTE — Progress Notes (Addendum)
ANTICOAGULATION CONSULT NOTE - Follow Up Consult  Pharmacy Consult for Heparin Indication: chest pain/ACS  Allergies  Allergen Reactions  . Lisinopril Cough    Rxn: unknown  . Statins Other (See Comments)    Muscle ache  . Welchol [Colesevelam Hcl] Itching    Patient Measurements: Height: 5' 8.9" (175 cm) Weight: 213 lb (96.616 kg) IBW/kg (Calculated) : 70.47 Heparin Dosing Weight: 90.6 kg  Vital Signs: Temp: 97.5 F (36.4 C) (08/13 0817) Temp src: Oral (08/13 0817) BP: 134/73 mmHg (08/13 0817) Pulse Rate: 61 (08/13 0832)  Labs:  Recent Labs  05/14/14 0930 05/14/14 2145 05/14/14 2340 05/15/14 0246 05/15/14 0820 05/16/14 0323 05/16/14 1110  HGB 11.9*  --   --  12.1*  --  11.8*  --   HCT 36.3*  --   --  36.0*  --  36.1*  --   PLT 167  --   --  175  --  172  --   LABPROT  --   --   --   --  14.1  --   --   INR  --   --   --   --  1.09  --   --   HEPARINUNFRC  --   --  0.19* 0.21*  --   --  0.22*  CREATININE 1.60*  --   --  1.45*  --  1.37*  --   TROPONINI  --  <0.30  --  <0.30 <0.30  --   --     Estimated Creatinine Clearance: 52.5 ml/min (by C-G formula based on Cr of 1.37).   Medications:  Heparin at 1350 u/hour  Assessment: 3 YOM with cardiac history significant for prior MI/CABG who presented on 8/11 with CP and was started on a heparin drip for anticoagulation. The patient was found via cath on 8/12 to have possible in-stent stenosis in the first obtuse marginal and proximal right coronary. Given the patient's hx CKD - PCI was not done due to the amount of contrast used for diagnostics. Heparin was resumed post-cath with plans are for PCI on Friday AM (8/14). Initial HL 8/13>>0.22 units/mL below goal of 0.3-0.7. CBC stable. No bleeding reported.  Goal of Therapy:  Heparin level 0.3-0.7 units/ml Monitor platelets by anticoagulation protocol: Yes   Plan:  1. Increase heparin dose to 1650 units/h (increase of ~3u/kg/h) based on recent cath. 2. F/u 8h  HL  Sharilyn Sites M 05/16/2014,12:11 PM   ============================================   Addendum: - HL 0.61 - no bleeding nor hematoma reported, s/p cath 8/12   Plan: - Decrease heparin gtt slightly to 1600 units/hr - F/U AM labs    Wynetta Seith D. Mina Marble, PharmD, BCPS Pager:  [phone] 05/16/2014, 8:35 PM

## 2014-05-16 NOTE — Progress Notes (Signed)
SUBJECTIVE:  No chest pain  OBJECTIVE:   Vitals:   Filed Vitals:   05/16/14 0400 05/16/14 0800 05/16/14 0817 05/16/14 0832  BP: 124/89 134/73 134/73   Pulse: 64 58 52 61  Temp: 98 F (36.7 C)  97.5 F (36.4 C)   TempSrc: Oral  Oral   Resp: 17 16 14    Height:      Weight: 213 lb (96.616 kg)     SpO2: 97% 96% 98%    I&O's:   Intake/Output Summary (Last 24 hours) at 05/16/14 1041 Last data filed at 05/16/14 1000  Gross per 24 hour  Intake   2516 ml  Output   1500 ml  Net   1016 ml   TELEMETRY: Reviewed telemetry pt in sinus bradycardia   PHYSICAL EXAM General: Well developed, well nourished, in no acute distress Head: Eyes PERRLA, No xanthomas.   Normal cephalic and atramatic  Lungs:   Clear bilaterally to auscultation and percussion. Heart:   HRRR S1 S2 Pulses are 2+ & equal. Abdomen: Bowel sounds are positive, abdomen soft and non-tender without masses Extremities:   No clubbing, cyanosis or edema.  DP +1 Neuro: Alert and oriented X 3. Psych:  Good affect, responds appropriately   LABS: Basic Metabolic Panel:  Recent Labs  05/15/14 0246 05/16/14 0323  NA 140 141  K 4.4 4.4  CL 102 107  CO2 23 23  GLUCOSE 147* 109*  BUN 23 16  CREATININE 1.45* 1.37*  CALCIUM 9.2 8.8   Liver Function Tests: No results found for this basename: AST, ALT, ALKPHOS, BILITOT, PROT, ALBUMIN,  in the last 72 hours No results found for this basename: LIPASE, AMYLASE,  in the last 72 hours CBC:  Recent Labs  05/15/14 0246 05/16/14 0323  WBC 5.6 5.0  HGB 12.1* 11.8*  HCT 36.0* 36.1*  MCV 94.7 94.8  PLT 175 172   Cardiac Enzymes:  Recent Labs  05/14/14 2145 05/15/14 0246 05/15/14 0820  TROPONINI <0.30 <0.30 <0.30   BNP: No components found with this basename: POCBNP,  D-Dimer: No results found for this basename: DDIMER,  in the last 72 hours Hemoglobin A1C: No results found for this basename: HGBA1C,  in the last 72 hours Fasting Lipid Panel:  Recent Labs  05/15/14 0246  CHOL 212*  HDL 36*  LDLCALC 124*  TRIG 262*  CHOLHDL 5.9   Thyroid Function Tests: No results found for this basename: TSH, T4TOTAL, FREET3, T3FREE, THYROIDAB,  in the last 72 hours Anemia Panel: No results found for this basename: VITAMINB12, FOLATE, FERRITIN, TIBC, IRON, RETICCTPCT,  in the last 72 hours Coag Panel:   Lab Results  Component Value Date   INR 1.09 05/15/2014   INR 1.03 05/01/2013   INR 0.95 11/15/2012    RADIOLOGY: Dg Chest 2 View  05/14/2014   CLINICAL DATA:  Mid sternal chest pain.  History of cardiac disease.  EXAM: CHEST  2 VIEW  COMPARISON:  11/25/2012 and 11/24/2012  FINDINGS: Postsurgical changes consistent with a CABG. Surgical fusion hardware in the lower cervical spine. Lungs are clear bilaterally. Stable appearance of the heart and mediastinum. Degenerative changes in the lower thoracic spine. No pleural effusions and no evidence for pulmonary edema.  IMPRESSION: No active cardiopulmonary disease.   Electronically Signed   By: Markus Daft M.D.   On: 05/14/2014 11:14   ASSESSMENT AND PLAN  1. CP with cath showing 75-80% stenosis of first OM with in-stent restenosis and diffuse disease distally.  There is  a 50-70% in-stent restenosis for the prox RCA stent, Patent LIMA to LAD.  Plan for PCI of the OM and FFR on the RCA on Friday. 2. coronary artery disease status post CABG x2 in 1997  -- continue ASA/Plavix/IV Heparin gtt/BB  3. h/o diastolic heart failure  4. Hypertension - BP elevated after stopping Losartan and HCTZ  - continue BB  - Losartan stopped secondary to renal insuff.  Will start amlodipine 5mg  daily for BP control 5. Diabetes  6. Hyperlipidemia - LDL elevated but he is statin intolerant of all statins. Would consider PSK 9 drug once discharged.  - continue Zetia/fenofibrate  7. obstructive sleep apnea  8. chronic kidney disease  9. AAA, stable in size, 4.4x4.4cm in distal aorta June 2015  10. Bradycardia: digoxin  stopped     Sueanne Margarita, MD  05/16/2014  10:41 AM

## 2014-05-17 ENCOUNTER — Telehealth: Payer: Self-pay | Admitting: Cardiovascular Disease

## 2014-05-17 ENCOUNTER — Encounter (HOSPITAL_COMMUNITY)
Admission: EM | Disposition: A | Discharge status: Home / Self Care | Payer: Medicare Other | Source: Home / Self Care | Attending: Internal Medicine

## 2014-05-17 DIAGNOSIS — T82855A Stenosis of coronary artery stent, initial encounter: Secondary | ICD-10-CM | POA: Diagnosis present

## 2014-05-17 HISTORY — PX: PERCUTANEOUS CORONARY STENT INTERVENTION (PCI-S): SHX5485

## 2014-05-17 LAB — CBC
HEMATOCRIT: 34.4 % — AB (ref 39.0–52.0)
Hemoglobin: 11.4 g/dL — ABNORMAL LOW (ref 13.0–17.0)
MCH: 32.2 pg (ref 26.0–34.0)
MCHC: 33.1 g/dL (ref 30.0–36.0)
MCV: 97.2 fL (ref 78.0–100.0)
Platelets: 154 10*3/uL (ref 150–400)
RBC: 3.54 MIL/uL — ABNORMAL LOW (ref 4.22–5.81)
RDW: 14.2 % (ref 11.5–15.5)
WBC: 4.7 10*3/uL (ref 4.0–10.5)

## 2014-05-17 LAB — GLUCOSE, CAPILLARY
GLUCOSE-CAPILLARY: 155 mg/dL — AB (ref 70–99)
Glucose-Capillary: 120 mg/dL — ABNORMAL HIGH (ref 70–99)
Glucose-Capillary: 152 mg/dL — ABNORMAL HIGH (ref 70–99)

## 2014-05-17 LAB — BASIC METABOLIC PANEL
Anion gap: 12 (ref 5–15)
BUN: 16 mg/dL (ref 6–23)
CALCIUM: 8.3 mg/dL — AB (ref 8.4–10.5)
CO2: 22 mEq/L (ref 19–32)
CREATININE: 1.5 mg/dL — AB (ref 0.50–1.35)
Chloride: 107 mEq/L (ref 96–112)
GFR calc Af Amer: 50 mL/min — ABNORMAL LOW (ref >90)
GFR, EST NON AFRICAN AMERICAN: 43 mL/min — AB (ref >90)
GLUCOSE: 129 mg/dL — AB (ref 70–99)
Potassium: 4.2 mEq/L (ref 3.7–5.3)
Sodium: 141 mEq/L (ref 137–147)

## 2014-05-17 LAB — HEPARIN LEVEL (UNFRACTIONATED): Heparin Unfractionated: 0.63 IU/mL (ref 0.30–0.70)

## 2014-05-17 LAB — POCT ACTIVATED CLOTTING TIME: Activated Clotting Time: 422 seconds

## 2014-05-17 SURGERY — PERCUTANEOUS CORONARY STENT INTERVENTION (PCI-S)
Anesthesia: LOCAL

## 2014-05-17 MED ORDER — SODIUM CHLORIDE 0.9 % IV SOLN: INTRAVENOUS | Status: DC

## 2014-05-17 MED ORDER — ADENOSINE 12 MG/4ML IV SOLN
16.0000 mL | INTRAVENOUS | Status: DC
  Filled 2014-05-17: qty 16

## 2014-05-17 MED ORDER — BIVALIRUDIN 250 MG IV SOLR
INTRAVENOUS | Status: AC
Start: 2014-05-17 — End: 2014-05-17
  Filled 2014-05-17: qty 250

## 2014-05-17 MED ORDER — LIDOCAINE HCL (PF) 1 % IJ SOLN
INTRAMUSCULAR | Status: AC
  Filled 2014-05-17: qty 30

## 2014-05-17 MED ORDER — VERAPAMIL HCL 2.5 MG/ML IV SOLN
INTRAVENOUS | Status: AC
  Filled 2014-05-17: qty 2

## 2014-05-17 MED ORDER — OXYCODONE-ACETAMINOPHEN 5-325 MG PO TABS: 1.0000 | ORAL_TABLET | ORAL | Status: DC | PRN

## 2014-05-17 MED ORDER — MIDAZOLAM HCL 2 MG/2ML IJ SOLN
INTRAMUSCULAR | Status: AC
  Filled 2014-05-17: qty 2

## 2014-05-17 MED ORDER — NITROGLYCERIN 1 MG/10 ML FOR IR/CATH LAB
INTRA_ARTERIAL | Status: AC
  Filled 2014-05-17: qty 10

## 2014-05-17 MED ORDER — HEPARIN (PORCINE) IN NACL 2-0.9 UNIT/ML-% IJ SOLN
INTRAMUSCULAR | Status: AC
  Filled 2014-05-17: qty 1000

## 2014-05-17 MED ORDER — HEPARIN (PORCINE) IN NACL 2-0.9 UNIT/ML-% IJ SOLN
INTRAMUSCULAR | Status: AC
  Filled 2014-05-17: qty 500

## 2014-05-17 MED ORDER — FENTANYL CITRATE 0.05 MG/ML IJ SOLN
INTRAMUSCULAR | Status: AC
  Filled 2014-05-17: qty 2

## 2014-05-17 MED ORDER — SODIUM CHLORIDE 0.9 % IV SOLN
1.7500 mg/kg/h | INTRAVENOUS | Status: AC
  Filled 2014-05-17: qty 250

## 2014-05-17 MED ORDER — INSULIN ASPART 100 UNIT/ML ~~LOC~~ SOLN
0.0000 [IU] | Freq: Three times a day (TID) | SUBCUTANEOUS | Status: DC
  Administered 2014-05-17: 2 [IU] via SUBCUTANEOUS
  Administered 2014-05-18: 1 [IU] via SUBCUTANEOUS

## 2014-05-17 NOTE — Telephone Encounter (Signed)
He had RCA and Cfx stents staged due to CKD. He was discharged off metformin and ARB until renal function rechecked on 05/20/14. It will be done at [facility].  If renal function okay these meds need to be resumed on Monday.

## 2014-05-17 NOTE — Progress Notes (Signed)
Nursing: Assessment of patient document in doc flow sheets. Patient has bilateral rales in both lower lobes at 2000 . Patient reports no complaint of SOB or discomfort. IV fluids infusing at 100 ml/hr per order. IV fluids stopped at 2130 and Cardiology Dr. Andy Gauss paged at 2142. Dr. Andy Gauss made aware of current assessment and IV fluids stopped at 2152. Order received to discontinue IV fluids and continue to monitor patient.

## 2014-05-17 NOTE — Progress Notes (Signed)
SUBJECTIVE:  Currently on cath lab table  OBJECTIVE:   Vitals:   Filed Vitals:   05/16/14 2000 05/16/14 2143 05/16/14 2350 05/17/14 0400  BP: 169/132  127/87 145/58  Pulse:   58 53  Temp: 97.6 F (36.4 C)  97.7 F (36.5 C) 97.7 F (36.5 C)  TempSrc: Oral  Oral Oral  Resp: 19  14   Height:      Weight:  215 lb 9.8 oz (97.8 kg)    SpO2: 100%  100% 100%   I&O's:   Intake/Output Summary (Last 24 hours) at 05/17/14 0859 Last data filed at 05/17/14 0600  Gross per 24 hour  Intake 3490.2 ml  Output   1725 ml  Net 1765.2 ml   TELEMETRY: Reviewed telemetry pt in sinus bradycardia at 53bpm     PHYSICAL EXAM Not examined - he is on cath lab table  LABS: Basic Metabolic Panel:  Recent Labs  05/16/14 1910 05/17/14 0211  NA 139 141  K 4.7 4.2  CL 105 107  CO2 22 22  GLUCOSE 146* 129*  BUN 16 16  CREATININE 1.52* 1.50*  CALCIUM 9.3 8.3*   Liver Function Tests: No results found for this basename: AST, ALT, ALKPHOS, BILITOT, PROT, ALBUMIN,  in the last 72 hours No results found for this basename: LIPASE, AMYLASE,  in the last 72 hours CBC:  Recent Labs  05/16/14 1910 05/17/14 0211  WBC 5.6 4.7  HGB 12.7* 11.4*  HCT 38.7* 34.4*  MCV 95.6 97.2  PLT 182 154   Cardiac Enzymes:  Recent Labs  05/14/14 2145 05/15/14 0246 05/15/14 0820  TROPONINI <0.30 <0.30 <0.30   BNP: No components found with this basename: POCBNP,  D-Dimer: No results found for this basename: DDIMER,  in the last 72 hours Hemoglobin A1C: No results found for this basename: HGBA1C,  in the last 72 hours Fasting Lipid Panel:  Recent Labs  05/15/14 0246  CHOL 212*  HDL 36*  LDLCALC 124*  TRIG 262*  CHOLHDL 5.9   Thyroid Function Tests: No results found for this basename: TSH, T4TOTAL, FREET3, T3FREE, THYROIDAB,  in the last 72 hours Anemia Panel: No results found for this basename: VITAMINB12, FOLATE, FERRITIN, TIBC, IRON, RETICCTPCT,  in the last 72 hours Coag Panel:   Lab  Results  Component Value Date   INR 1.06 05/16/2014   INR 1.09 05/15/2014   INR 1.03 05/01/2013    RADIOLOGY: Dg Chest 2 View  05/14/2014   CLINICAL DATA:  Mid sternal chest pain.  History of cardiac disease.  EXAM: CHEST  2 VIEW  COMPARISON:  11/25/2012 and 11/24/2012  FINDINGS: Postsurgical changes consistent with a CABG. Surgical fusion hardware in the lower cervical spine. Lungs are clear bilaterally. Stable appearance of the heart and mediastinum. Degenerative changes in the lower thoracic spine. No pleural effusions and no evidence for pulmonary edema.  IMPRESSION: No active cardiopulmonary disease.   Electronically Signed   By: Markus Daft M.D.   On: 05/14/2014 11:14   ASSESSMENT AND PLAN  1. CP with cath showing 75-80% stenosis of first OM with in-stent restenosis and diffuse disease distally. There is a 50-70% in-stent restenosis for the prox RCA stent, Patent LIMA to LAD. Plan for PCI of the OM and FFR on the RCA today. 2. coronary artery disease status post CABG x2 in 1997  -- continue ASA/Plavix/IV Heparin gtt/BB  3. h/o diastolic heart failure  4. Hypertension - BP elevated after stopping Losartan and HCTZ but improved  after starting Amlodipine - continue BB/amlodipine  5. Diabetes  6. Hyperlipidemia - LDL elevated but he is statin intolerant of all statins. Would consider PSK 9 drug once discharged.  - continue Zetia/fenofibrate  7. obstructive sleep apnea  8. chronic kidney disease  9. AAA, stable in size, 4.4x4.4cm in distal aorta June 2015  10. Bradycardia: digoxin stopped     Sueanne Margarita, MD  05/17/2014  8:59 AM

## 2014-05-17 NOTE — Interval H&P Note (Signed)
Cath Lab Visit (complete for each Cath Lab visit)  Clinical Evaluation Leading to the Procedure:   ACS: Yes.    Non-ACS:    Anginal Classification: CCS III  Anti-ischemic medical therapy: Maximal Therapy (2 or more classes of medications)  Non-Invasive Test Results: No non-invasive testing performed  Prior CABG: Previous CABG      History and Physical Interval Note:  05/17/2014 7:22 AM  [name]  has presented today for surgery, with the diagnosis of cad  The various methods of treatment have been discussed with the patient and family. After consideration of risks, benefits and other options for treatment, the patient has consented to  Procedure(s): PERCUTANEOUS CORONARY STENT INTERVENTION (PCI-S) (N/A) as a surgical intervention .  The patient's history has been reviewed, patient examined, no change in status, stable for surgery.  I have reviewed the patient's chart and labs.  Questions were answered to the patient's satisfaction.     Sinclair Grooms

## 2014-05-17 NOTE — H&P (View-Only) (Signed)
SUBJECTIVE:  No chest pain  OBJECTIVE:   Vitals:   Filed Vitals:   05/16/14 0400 05/16/14 0800 05/16/14 0817 05/16/14 0832  BP: 124/89 134/73 134/73   Pulse: 64 58 52 61  Temp: 98 F (36.7 C)  97.5 F (36.4 C)   TempSrc: Oral  Oral   Resp: 17 16 14    Height:      Weight: 213 lb (96.616 kg)     SpO2: 97% 96% 98%    I&O's:   Intake/Output Summary (Last 24 hours) at 05/16/14 1041 Last data filed at 05/16/14 1000  Gross per 24 hour  Intake   2516 ml  Output   1500 ml  Net   1016 ml   TELEMETRY: Reviewed telemetry pt in sinus bradycardia   PHYSICAL EXAM General: Well developed, well nourished, in no acute distress Head: Eyes PERRLA, No xanthomas.   Normal cephalic and atramatic  Lungs:   Clear bilaterally to auscultation and percussion. Heart:   HRRR S1 S2 Pulses are 2+ & equal. Abdomen: Bowel sounds are positive, abdomen soft and non-tender without masses Extremities:   No clubbing, cyanosis or edema.  DP +1 Neuro: Alert and oriented X 3. Psych:  Good affect, responds appropriately   LABS: Basic Metabolic Panel:  Recent Labs  05/15/14 0246 05/16/14 0323  NA 140 141  K 4.4 4.4  CL 102 107  CO2 23 23  GLUCOSE 147* 109*  BUN 23 16  CREATININE 1.45* 1.37*  CALCIUM 9.2 8.8   Liver Function Tests: No results found for this basename: AST, ALT, ALKPHOS, BILITOT, PROT, ALBUMIN,  in the last 72 hours No results found for this basename: LIPASE, AMYLASE,  in the last 72 hours CBC:  Recent Labs  05/15/14 0246 05/16/14 0323  WBC 5.6 5.0  HGB 12.1* 11.8*  HCT 36.0* 36.1*  MCV 94.7 94.8  PLT 175 172   Cardiac Enzymes:  Recent Labs  05/14/14 2145 05/15/14 0246 05/15/14 0820  TROPONINI <0.30 <0.30 <0.30   BNP: No components found with this basename: POCBNP,  D-Dimer: No results found for this basename: DDIMER,  in the last 72 hours Hemoglobin A1C: No results found for this basename: HGBA1C,  in the last 72 hours Fasting Lipid Panel:  Recent Labs  05/15/14 0246  CHOL 212*  HDL 36*  LDLCALC 124*  TRIG 262*  CHOLHDL 5.9   Thyroid Function Tests: No results found for this basename: TSH, T4TOTAL, FREET3, T3FREE, THYROIDAB,  in the last 72 hours Anemia Panel: No results found for this basename: VITAMINB12, FOLATE, FERRITIN, TIBC, IRON, RETICCTPCT,  in the last 72 hours Coag Panel:   Lab Results  Component Value Date   INR 1.09 05/15/2014   INR 1.03 05/01/2013   INR 0.95 11/15/2012    RADIOLOGY: Dg Chest 2 View  05/14/2014   CLINICAL DATA:  Mid sternal chest pain.  History of cardiac disease.  EXAM: CHEST  2 VIEW  COMPARISON:  11/25/2012 and 11/24/2012  FINDINGS: Postsurgical changes consistent with a CABG. Surgical fusion hardware in the lower cervical spine. Lungs are clear bilaterally. Stable appearance of the heart and mediastinum. Degenerative changes in the lower thoracic spine. No pleural effusions and no evidence for pulmonary edema.  IMPRESSION: No active cardiopulmonary disease.   Electronically Signed   By: Markus Daft M.D.   On: 05/14/2014 11:14   ASSESSMENT AND PLAN  1. CP with cath showing 75-80% stenosis of first OM with in-stent restenosis and diffuse disease distally.  There is  a 50-70% in-stent restenosis for the prox RCA stent, Patent LIMA to LAD.  Plan for PCI of the OM and FFR on the RCA on Friday. 2. coronary artery disease status post CABG x2 in 1997  -- continue ASA/Plavix/IV Heparin gtt/BB  3. h/o diastolic heart failure  4. Hypertension - BP elevated after stopping Losartan and HCTZ  - continue BB  - Losartan stopped secondary to renal insuff.  Will start amlodipine 5mg  daily for BP control 5. Diabetes  6. Hyperlipidemia - LDL elevated but he is statin intolerant of all statins. Would consider PSK 9 drug once discharged.  - continue Zetia/fenofibrate  7. obstructive sleep apnea  8. chronic kidney disease  9. AAA, stable in size, 4.4x4.4cm in distal aorta June 2015  10. Bradycardia: digoxin  stopped     Sueanne Margarita, MD  05/16/2014  10:41 AM

## 2014-05-17 NOTE — Care Management Note (Signed)
    Page 1 of 1   05/17/2014     9:30:27 AM CARE MANAGEMENT NOTE 05/17/2014  Patient:  [name], [name]   Account Number:  [id number]  Date Initiated:  05/17/2014  Documentation initiated by:  Elissa Hefty  Subjective/Objective Assessment:   adm w angina     Action/Plan:   lives w wife, pcp dr Glo Herring   Anticipated DC Date:     Anticipated DC Plan:           Choice offered to / List presented to:             Status of service:   Medicare Important Message given?  YES (If response is "NO", the following Medicare IM given date fields will be blank) Date Medicare IM given:  05/17/2014 Medicare IM given by:  Elissa Hefty Date Additional Medicare IM given:   Additional Medicare IM given by:    Discharge Disposition:    Per UR Regulation:  Reviewed for med. necessity/level of care/duration of stay  If discussed at [place] of Stay Meetings, dates discussed:    Comments:

## 2014-05-17 NOTE — CV Procedure (Signed)
     PERCUTANEOUS CORONARY INTERVENTION   MARTESE VANATTA is a 78 y.o. male  INDICATION: Unstable angina   PROCEDURE: 1. Obtuse marginal DES for ISR; 2. DES proximal RCA for SR   CONSENT: The risks, benefits, and details of the procedure were explained to the patient. Risks including death, MI, stroke, bleeding, limb ischemia, emergency CABG, renal failure and allergy were described and accepted by the patient.  Informed written consent was obtained prior to proceeding.  PROCEDURE TECHNIQUE:  After Xylocaine anesthesia a 5 French Slender sheath was placed in the right radial artery with a single anterior needle wall stick.   Coronary guiding shots were made using a 3.5 cm XB guide catheter. Antithrombotic therapy, bivalirudin bolus and infusion, was begun and determined to be therapeutic by ACT. Antiplatelet therapy,Plavix, has been used continuously since admission. P2Y12 documented to be less than 180 seconds on 2 occasions during this hospital stay.  A BMW wire was used to cross the stenosis in the mid circumflex at the distal stent margin. Predilatation was performed with a 2.75 x 6 mm cutting balloon. We then positioned and deployed a 2.75 x 12 mm Promus Premier 2 inflations to a peak pressure of 16 atmospheres with effective diameter of 2.94 mm . Angiographic result was felt to be acceptable.  We then turned our attention to the right coronary. A JR 55 [place] guide catheter was used to obtain guiding shots. There was 70-80% stenosis within the proximal margin of the previously placed stent. We used a BMW wire and the same cutting balloon predilated to 8 atmospheres. We then positioned in place a 3.0 x 8 mm Promus Premier at 15 atmospheres. We then post dilated with a 3.25x6 mm long [place] Emerge to 13 atmospheres. The final angiographic result was acceptable with brisk TIMI grade 3 flow noted.  CONTRAST:  Total of 185 cc.  COMPLICATIONS:  None  ANGIOGRAPHIC RESULTS:   1. The 85-90% mid  obtuse marginal ISR was reduced to 0% with TIMI grade 3 flow after cutting balloon and DES deployment to 3.0 mm in diameter. 2. 70% in-stent restenosis in the proximal RCA reduced to 0% after cutting balloon angioplasty and DES implantation with post dilatation of 3.25 mm in diameter.   IMPRESSIONS:  1. Successful two-vessel intervention with placement of DES for ISR in the mid obtuse marginal and the proximal RCA with 0% stenosis.   RECOMMENDATION:  Dual antiplatelet therapy with aspirin and Plavix. Eligible for discharge in a.m. if creatinine does not show a significant rise. I would not discharge the patient on ACE inhibitor therapy. He will need to have a basic metabolic panel obtained on Monday and if creatinine remains stable the ACE inhibitor may be resumed.    Sinclair Grooms, MD 05/17/2014 9:24 AM

## 2014-05-18 ENCOUNTER — Other Ambulatory Visit: Payer: Self-pay | Admitting: Physician Assistant

## 2014-05-18 DIAGNOSIS — N183 Chronic kidney disease, stage 3 (moderate): Secondary | ICD-10-CM

## 2014-05-18 DIAGNOSIS — E118 Type 2 diabetes mellitus with unspecified complications: Secondary | ICD-10-CM

## 2014-05-18 LAB — BASIC METABOLIC PANEL
ANION GAP: 11 (ref 5–15)
BUN: 17 mg/dL (ref 6–23)
CALCIUM: 8.8 mg/dL (ref 8.4–10.5)
CO2: 23 mEq/L (ref 19–32)
Chloride: 106 mEq/L (ref 96–112)
Creatinine, Ser: 1.43 mg/dL — ABNORMAL HIGH (ref 0.50–1.35)
GFR calc Af Amer: 53 mL/min — ABNORMAL LOW (ref >90)
GFR, EST NON AFRICAN AMERICAN: 46 mL/min — AB (ref >90)
Glucose, Bld: 119 mg/dL — ABNORMAL HIGH (ref 70–99)
POTASSIUM: 4.4 meq/L (ref 3.7–5.3)
SODIUM: 140 meq/L (ref 137–147)

## 2014-05-18 LAB — CBC
HCT: 33.5 % — ABNORMAL LOW (ref 39.0–52.0)
Hemoglobin: 11 g/dL — ABNORMAL LOW (ref 13.0–17.0)
MCH: 31.1 pg (ref 26.0–34.0)
MCHC: 32.8 g/dL (ref 30.0–36.0)
MCV: 94.6 fL (ref 78.0–100.0)
PLATELETS: 158 10*3/uL (ref 150–400)
RBC: 3.54 MIL/uL — ABNORMAL LOW (ref 4.22–5.81)
RDW: 14.2 % (ref 11.5–15.5)
WBC: 5.5 10*3/uL (ref 4.0–10.5)

## 2014-05-18 LAB — HEMOGLOBIN A1C
HEMOGLOBIN A1C: 7.6 % — AB (ref <5.7)
Mean Plasma Glucose: 171 mg/dL — ABNORMAL HIGH (ref <117)

## 2014-05-18 LAB — GLUCOSE, CAPILLARY
Glucose-Capillary: 133 mg/dL — ABNORMAL HIGH (ref 70–99)
Glucose-Capillary: 126 mg/dL — ABNORMAL HIGH (ref 70–99)

## 2014-05-18 MED ORDER — AMLODIPINE BESYLATE 5 MG PO TABS: 5.0000 mg | ORAL_TABLET | Freq: Every day | ORAL | Status: DC

## 2014-05-18 MED ORDER — METOPROLOL SUCCINATE ER 25 MG PO TB24: 25.0000 mg | ORAL_TABLET | Freq: Every day | ORAL | Status: DC

## 2014-05-18 MED ORDER — METOPROLOL SUCCINATE ER 25 MG PO TB24
25.0000 mg | ORAL_TABLET | Freq: Every day | ORAL | Status: DC
  Filled 2014-05-18: qty 1

## 2014-05-18 MED ORDER — NITROGLYCERIN 0.4 MG SL SUBL: 0.4000 mg | SUBLINGUAL_TABLET | SUBLINGUAL | Status: DC | PRN

## 2014-05-18 NOTE — Progress Notes (Signed)
Patient has already walked approximately 700 feet independently x 2 this morning  without angina or difficulty.  Education completed regarding angina symptoms, NTG usage, when to call 911, when to call the doctor, activity progression, heart healthy nutrition, and cath site care and restrictions.  Patient has had multiple back surgeries and feels phase II cardiac rehab will not work for him, but he would like to pursue a water aerobics program.  Cautioned to have Dr. Wilhemina Cash ok this when he has his follow-up appointment.  Given home exercise instructions, he has a stationary bike he will ride along with walking short amount of time.   [id number] Rosebud Poles RN

## 2014-05-18 NOTE — Discharge Summary (Signed)
Physician Discharge Summary     Patient ID: [name] MRN: [id number] DOB/AGE: Jan 09, 1936 78 y.o.  Admit date: 05/14/2014 Discharge date: 05/18/2014  Admission Diagnoses: Unstable angina  Discharge Diagnoses:  Principal Problem:   Unstable angina Active Problems:   Type II diabetes mellitus with complication -- CAD, AAA   Hypertension associated with diabetes   Hyperlipidemia LDL goal <70; statin intolerant   AAA (abdominal aortic aneurysm) without rupture   Obesity (BMI 30-39.9)   CAD S/P CABG and PCI - 3 DES stents to RCA, one in circumflex-OM   Chest pain   Discharged Condition: stable  Hospital Course:   The patient is a 78 year old Caucasian male with past medical history significant for diastolic heart failure, hypertension, diabetes, hyperlipidemia, obstructive sleep apnea, chronic kidney disease, AAA, and history of significant coronary artery disease status post CABG x2 in 1997. According to medical record, patient had multiple MIs since 1985 including multiple PCI procedures. His last cardiac catheterization was on 01/01/2009 which revealed EF 50%, left circumflex 70-80% narrowing, however the segment was followed by a saccular aneurysmal segment and is not amenable to PCI intervention, patent LIMA to LAD, SVG to OM was chronically occluded, widely patent stents in native OM and RCA. His last stress test on 09/22/2012 was low risk nuclear stress test demonstrated a moderate regional mid to basal inferior to inferolateral scar with no significant ischemia, with EF 47%. Patient has been doing relatively well since that time, however he's been troubled with significant cervical and back pain which limits his ability to ambulate. He was seen by Dr. Ellyn Hack in March 2015, at which time he was still doing well. Unfortunately the patient has been intolerant to any of the statin medications, however, he was placed on Zetia which started to help with his cholesterol profile. He states  he has some lower extremity swelling last week however has since resolved. He denies any recent fever, chill, cough, orthopnea or paroxysmal nocturnal dyspnea. He denies any recent abdominal pain, and states his last ultrasound of the abdomen revealed stable AAA in June.   Patient was walking outside all day yesterday on 05/13/2014. Around 4 PM he started having left-sided burning sensation and squeeze sensation reminiscent of his previous angina symptom. The chest discomfort was also associated with some shortness of breath as well which he attributed to overexertion. The chest discomfort eventually went away after half hour. The patient woke up with significant substernal chest pain and left hand numbness around 3 AM in the morning of 05/14/2014, which prompted him to seek medical attention at [facility] ED. By the time he reached ED, patient chest pain began to resolve after about 5 hours. He has had a history of GERD, however this episode feels different from his usual acid reflux symptom. On initial presentation, his blood pressure was stable with blood pressure 142/71. He has some bradycardia with heart rate in the high 40s and 50s. Telemetry also noticed the patient has some bigeminy as well. Significant laboratory finding include creatinine of 1.6 which has increased from 1.45 nine month ago. His hemoglobin stable around 11.9. Initial point-of-care troponin was negative. Chest x-ray was negative for acute process. EKG showed some Q waves in the inferior lead, and a downsloping of V1 and V2 which was present since his previous EKG in 2014.   The patient was admitted and ruled out for MI.  He was hydrated overnight and went for a left heart cath the following day.  The procedure  revealed significant stenosis in the first obtuse marginal just distal to the stent margin. I believe this represents in-stent restenosis. The region of obstruction is in the 75-80% range angiographically, therefore not severe enough  to cause pain at rest. The continuation of the circumflex beyond the first obtuse marginal is diffusely diseased and not amenable to PCI.  Moderate in-stent restenosis in the proximal right coronary, 50-70% narrowing. Patent LIMA to the LAD  Left ventriculogram was not performed.  Two days later he underwent successful two-vessel intervention with placement of DES for ISR in the mid obtuse marginal and the proximal RCA with 0% stenosis. P2Y12 showed good response from plavix. We held ARB and metformin until renal function recheck on Aug 17.  Order written.   The patient was seen by Dr. Radford Pax who felt he was stable for DC home.   Consults: None  Significant Diagnostic Studies:   Left Heart Catheterization with Coronary and bypass Angiography Report  [name]  78 y.o.  male  June 07, 1936  Procedure Date: 05/15/2014  Referring Physician: [name] Him, M.D.  Primary Cardiologist:: Glenetta Hew, M.D.  INDICATIONS: Unstable angina  PROCEDURE: 1. Left heart catheterization; 2. Coronary angiography; 3. Left ventricular hemodynamic recordings; 4. Bypass graft angiography  CONSENT:  The risks, benefits, and details of the procedure were explained in detail to the patient. Risks including death, stroke, heart attack, kidney injury, allergy, limb ischemia, bleeding and radiation injury were discussed. The patient verbalized understanding and wanted to proceed. Informed written consent was obtained.  PROCEDURE TECHNIQUE: After Xylocaine anesthesia a 5 French Slender sheath was placed in the left radial artery with an angiocath and the modified Seldinger technique. Coronary angiography was done using a 5 F JR 4, JL 3.5, JL 4, and IMA diagnostic catheterS. Left ventriculography was not performed to reduce contrast exposure and avoid potential kidney injury.  We review the digital images in the cath suite. The lesion in the proximal circumflex distal to the stent margin is new and likely hemodynamically  significant. The proximal in-stent restenosis in the right coronary appears to be intermediate and likely not hemodynamically significant. Given the patient's renal impairment, we did not add PCI on to this procedure and have decided to wait 24-48 hours before intervention.  CONTRAST: Total of 110 cc.  COMPLICATIONS: None  HEMODYNAMICS: Aortic pressure 144/63 mmHg; LV pressure 144/8 mmHg; LVEDP 16 mm mercury  ANGIOGRAPHIC DATA: The left main coronary artery is moderately calcified but widely patent.  The left anterior descending artery is totally occluded after the first septal perforator.  The left circumflex artery is contains stents. The first obtuse marginal contains a focal area of lucency just beyond the stent margin in the proximal vessel. This is new and obstructs the vessel by 70-85% depending upon the view. The continuation of the circumflex beyond the first obtuse marginal contains 90% narrowing on either side of the sacral aneurysm. The vessel is small and not amenable to PCI.  The right coronary artery is patent. Multiple overlapping stents are noted from proximal to distal RCA. The very proximal margin of the stented region contains segmental 60-75% stenosis. The mid and distal vessel is widely patent.Marland Kitchen  BYPASS GRAFT ANGIOGRAPHY: Saphenous vein graft to the obtuse marginal is totally occluded (previously demonstrated)  The left internal mammary graft to the LAD is widely patent  LEFT VENTRICULOGRAM: Left ventricular angiogram was not performed given the patient's chronic kidney disease. We did measure pressures which were normal.  IMPRESSIONS: 1. Significant stenosis in  the first obtuse marginal just distal to the stent margin. I believe this represents in-stent restenosis. The region of obstruction is in the 75-80% range angiographically, therefore not severe enough to cause pain at rest. The continuation of the circumflex beyond the first obtuse marginal is diffusely diseased and not  amenable to PCI  2. Moderate in-stent restenosis in the proximal right coronary, 50-70% narrowing  3. Patent LIMA to the LAD  4. Left ventriculogram was not performed.  RECOMMENDATION: None of the patient's lesions seem severe enough to cause chest discomfort at rest. I do believe the circumflex stenosis is hemodynamically significant and the in-stent restenosis in the right coronary is borderline. We did not proceed with intervention given the patient's chronic kidney disease and the amount of contrast used for the diagnostic procedure.  I will check a P2 Y12 and basic metabolic panel in the a.m. We could potentially perform PCI on the obtuse marginal on Thursday afternoon or Friday morning (preferable) depending upon the patient's renal function. We will do FFR on the RCA at the same time and if significant, perform PCI .   PERCUTANEOUS CORONARY INTERVENTION  [name] is a 78 y.o. male  INDICATION: Unstable angina  PROCEDURE: 1. Obtuse marginal DES for ISR; 2. DES proximal RCA for SR  CONSENT:  The risks, benefits, and details of the procedure were explained to the patient. Risks including death, MI, stroke, bleeding, limb ischemia, emergency CABG, renal failure and allergy were described and accepted by the patient. Informed written consent was obtained prior to proceeding.  PROCEDURE TECHNIQUE: After Xylocaine anesthesia a 5 French Slender sheath was placed in the right radial artery with a single anterior needle wall stick. Coronary guiding shots were made using a 3.5 cm XB guide catheter. Antithrombotic therapy, bivalirudin bolus and infusion, was begun and determined to be therapeutic by ACT. Antiplatelet therapy,Plavix, has been used continuously since admission. P2Y12 documented to be less than 180 seconds on 2 occasions during this hospital stay.  A BMW wire was used to cross the stenosis in the mid circumflex at the distal stent margin. Predilatation was performed with a 2.75 x 6 mm  cutting balloon. We then positioned and deployed a 2.75 x 12 mm Promus Premier 2 inflations to a peak pressure of 16 atmospheres with effective diameter of 2.94 mm . Angiographic result was felt to be acceptable.  We then turned our attention to the right coronary. A JR 57 [place] guide catheter was used to obtain guiding shots. There was 70-80% stenosis within the proximal margin of the previously placed stent. We used a BMW wire and the same cutting balloon predilated to 8 atmospheres. We then positioned in place a 3.0 x 8 mm Promus Premier at 15 atmospheres. We then post dilated with a 3.25x6 mm long [place] Emerge to 13 atmospheres. The final angiographic result was acceptable with brisk TIMI grade 3 flow noted.  CONTRAST: Total of 185 cc.  COMPLICATIONS: None  ANGIOGRAPHIC RESULTS: 1. The 85-90% mid obtuse marginal ISR was reduced to 0% with TIMI grade 3 flow after cutting balloon and DES deployment to 3.0 mm in diameter.  2. 70% in-stent restenosis in the proximal RCA reduced to 0% after cutting balloon angioplasty and DES implantation with post dilatation of 3.25 mm in diameter.  IMPRESSIONS: 1. Successful two-vessel intervention with placement of DES for ISR in the mid obtuse marginal and the proximal RCA with 0% stenosis.  RECOMMENDATION: Dual antiplatelet therapy with aspirin and Plavix.  Eligible  for discharge in a.m. if creatinine does not show a significant rise. I would not discharge the patient on ACE inhibitor therapy. He will need to have a basic metabolic panel obtained on Monday and if creatinine remains stable the ACE inhibitor may be resumed.  Sinclair Grooms, MD  Treatments: See above  Discharge Exam: Blood pressure 143/76, pulse 70, temperature 97.6 F (36.4 C), temperature source Oral, resp. rate 18, height 5' 8.9" (1.75 m), weight 215 lb 9.8 oz (97.8 kg), SpO2 95.00%.   Disposition: 01-Home or Self Care      Discharge Instructions   Diet - low sodium heart healthy     Complete by:  As directed      Discharge instructions    Complete by:  As directed   No lifting with right arm for two days.     Increase activity slowly    Complete by:  As directed             Medication List    STOP taking these medications       digoxin 0.125 MG tablet  Commonly known as:  LANOXIN     losartan-hydrochlorothiazide 100-12.5 MG per tablet  Commonly known as:  HYZAAR     pioglitazone-metformin 15-500 MG per tablet  Commonly known as:  ACTOPLUS MET      TAKE these medications       amLODipine 5 MG tablet  Commonly known as:  NORVASC  Take 1 tablet (5 mg total) by mouth daily.     aspirin 81 MG tablet  Take 81 mg by mouth daily.     Choline Fenofibrate 135 MG capsule  Commonly known as:  TRILIPIX  Take 1 capsule (135 mg total) by mouth daily.     clopidogrel 75 MG tablet  Commonly known as:  PLAVIX  Take 1 tablet (75 mg total) by mouth daily.     ezetimibe 10 MG tablet  Commonly known as:  ZETIA  Take 1 tablet (10 mg total) by mouth daily.     ferrous sulfate 325 (65 FE) MG tablet  Take 325 mg by mouth daily with breakfast.     methocarbamol 750 MG tablet  Commonly known as:  ROBAXIN  Take 750 mg by mouth every 6 (six) hours as needed for muscle spasms.     metoprolol succinate 25 MG 24 hr tablet  Commonly known as:  TOPROL-XL  Take 1 tablet (25 mg total) by mouth daily before breakfast.  Start taking on:  05/19/2014     multivitamin with minerals tablet  Take 1 tablet by mouth daily.     nitroGLYCERIN 0.4 MG SL tablet  Commonly known as:  NITROSTAT  Place 1 tablet (0.4 mg total) under the tongue every 5 (five) minutes x 3 doses as needed for chest pain.     pantoprazole 40 MG tablet  Commonly known as:  PROTONIX  Take 1 tablet (40 mg total) by mouth daily.     prednisoLONE acetate 1 % ophthalmic suspension  Commonly known as:  PRED FORTE  Place 1 drop into the left eye 4 (four) times daily.       Follow-up Information   Follow  up with Leonie Man, MD. (The office will call you with the follow up appt date and time.)    Specialty:  Cardiology   Contact information:   [address] [place] [address] [phone]       Follow up with LABS On 05/20/2014. (  Basic Metabolic panel on Monday at any lab. )      Greater than 30 minutes was spent completing the patient's discharge.   SignedTarri Fuller, PA-C 05/18/2014, 1:53 PM

## 2014-05-18 NOTE — Progress Notes (Signed)
SUBJECTIVE:  No complaints  OBJECTIVE:   Vitals:   Filed Vitals:   05/18/14 0400 05/18/14 0600 05/18/14 0800 05/18/14 0839  BP: 124/54  119/51   Pulse: 48 49 71 77  Temp:   97.8 F (36.6 C)   TempSrc:   Oral   Resp: 16 19 18    Height:      Weight:      SpO2: 96% 97% 95%    I&O's:   Intake/Output Summary (Last 24 hours) at 05/18/14 0916 Last data filed at 05/18/14 0500  Gross per 24 hour  Intake   2060 ml  Output   1700 ml  Net    360 ml   TELEMETRY: Reviewed telemetry pt in NSR:     PHYSICAL EXAM General: Well developed, well nourished, in no acute distress Head: Eyes PERRLA, No xanthomas.   Normal cephalic and atramatic  Lungs:   Clear bilaterally to auscultation and percussion. Heart:   HRRR S1 S2 Pulses are 2+ & equal. Abdomen: Bowel sounds are positive, abdomen soft and non-tender without masses  Extremities:   No clubbing, cyanosis or edema.  DP +1 Neuro: Alert and oriented X 3. Psych:  Good affect, responds appropriately   LABS: Basic Metabolic Panel:  Recent Labs  05/17/14 0211 05/18/14 0242  NA 141 140  K 4.2 4.4  CL 107 106  CO2 22 23  GLUCOSE 129* 119*  BUN 16 17  CREATININE 1.50* 1.43*  CALCIUM 8.3* 8.8   Liver Function Tests: No results found for this basename: AST, ALT, ALKPHOS, BILITOT, PROT, ALBUMIN,  in the last 72 hours No results found for this basename: LIPASE, AMYLASE,  in the last 72 hours CBC:  Recent Labs  05/17/14 0211 05/18/14 0242  WBC 4.7 5.5  HGB 11.4* 11.0*  HCT 34.4* 33.5*  MCV 97.2 94.6  PLT 154 158   Cardiac Enzymes: No results found for this basename: CKTOTAL, CKMB, CKMBINDEX, TROPONINI,  in the last 72 hours BNP: No components found with this basename: POCBNP,  D-Dimer: No results found for this basename: DDIMER,  in the last 72 hours Hemoglobin A1C: No results found for this basename: HGBA1C,  in the last 72 hours Fasting Lipid Panel: No results found for this basename: CHOL, HDL, LDLCALC, TRIG,  CHOLHDL, LDLDIRECT,  in the last 72 hours Thyroid Function Tests: No results found for this basename: TSH, T4TOTAL, FREET3, T3FREE, THYROIDAB,  in the last 72 hours Anemia Panel: No results found for this basename: VITAMINB12, FOLATE, FERRITIN, TIBC, IRON, RETICCTPCT,  in the last 72 hours Coag Panel:   Lab Results  Component Value Date   INR 1.06 05/16/2014   INR 1.09 05/15/2014   INR 1.03 05/01/2013    RADIOLOGY: Dg Chest 2 View  05/14/2014   CLINICAL DATA:  Mid sternal chest pain.  History of cardiac disease.  EXAM: CHEST  2 VIEW  COMPARISON:  11/25/2012 and 11/24/2012  FINDINGS: Postsurgical changes consistent with a CABG. Surgical fusion hardware in the lower cervical spine. Lungs are clear bilaterally. Stable appearance of the heart and mediastinum. Degenerative changes in the lower thoracic spine. No pleural effusions and no evidence for pulmonary edema.  IMPRESSION: No active cardiopulmonary disease.   Electronically Signed   By: Markus Daft M.D.   On: 05/14/2014 11:14   ASSESSMENT AND PLAN  1. CP with cath showing 75-80% stenosis of first OM with in-stent restenosis and diffuse disease distally. There was 50-70% in-stent restenosis for the prox RCA stent, Patent LIMA  to LAD. S/P PCI of the OM and RCA with DES 2. coronary artery disease status post CABG x2 in 1997  - continue ASA/Plavix/BB  3. h/o diastolic heart failure - appears euvolemic 4. Hypertension - BP elevated after stopping Losartan and HCTZ but improved after starting Amlodipine  - continue BB/amlodipine  - will not restart ARB until renal function rechecked in office 8/17 5. Diabetes - no metformin until renal function rechecked on 8/17 as an outpt 6. Hyperlipidemia - LDL elevated but he is statin intolerant of all statins. Would consider PSK 9 drug once discharged.  - continue Zetia/fenofibrate  7. obstructive sleep apnea  8. chronic kidney disease - renal function stable and will recheck BMET in office on 8/17 9. AAA,  stable in size, 4.4x4.4cm in distal aorta June 2015  10. Bradycardia: digoxin stopped but still brady.  Will decrease Toprol to 25mg  daily.  Patient is stable and ok for discharge today.  Will keep off metformin and ARB until BMET checked on Monday at [facility].   Julian Margarita, MD  05/18/2014  9:16 AM

## 2014-05-19 NOTE — Discharge Summary (Signed)
Agree with discharge summary as stated above

## 2014-05-20 ENCOUNTER — Telehealth: Payer: Self-pay | Admitting: Cardiology

## 2014-05-20 ENCOUNTER — Other Ambulatory Visit: Payer: Self-pay | Admitting: *Deleted

## 2014-05-20 ENCOUNTER — Encounter: Payer: Self-pay | Admitting: Cardiology

## 2014-05-20 DIAGNOSIS — N183 Chronic kidney disease, stage 3 unspecified: Secondary | ICD-10-CM | POA: Diagnosis not present

## 2014-05-20 LAB — BASIC METABOLIC PANEL
BUN: 19 mg/dL (ref 6–23)
CHLORIDE: 108 meq/L (ref 96–112)
CO2: 25 meq/L (ref 19–32)
Calcium: 9.2 mg/dL (ref 8.4–10.5)
Creat: 1.37 mg/dL — ABNORMAL HIGH (ref 0.50–1.35)
GLUCOSE: 148 mg/dL — AB (ref 70–99)
Potassium: 4.9 mEq/L (ref 3.5–5.3)
Sodium: 141 mEq/L (ref 135–145)

## 2014-05-20 MED FILL — Sodium Chloride IV Soln 0.9%: INTRAVENOUS | Qty: 50 | Status: AC

## 2014-05-20 NOTE — Telephone Encounter (Signed)
Returned call to patient no answer.LMTC. 

## 2014-05-20 NOTE — Telephone Encounter (Signed)
[name] is needing a lab order sent to her at [facility] . Patient is there waiting

## 2014-05-20 NOTE — Telephone Encounter (Signed)
This encounter was created in error - please disregard.

## 2014-05-20 NOTE — Telephone Encounter (Signed)
Was taken off of his dioxin 0.125mg  when had his stents placed not sure why they took him off , has a question about it . Please Call  thanks

## 2014-05-20 NOTE — Telephone Encounter (Signed)
Received a call back from patient's wife she stated husband was in hospital last week.Stated digoxin,metformin,losartan stopped.Stated he had blood work this morning.Advised to continue to hold.Advised to ask when called with lab results when to restart.Advised to keep post hospital appointment with Kerin Ransom 06/06/14 at 8:30 am.

## 2014-05-20 NOTE — Telephone Encounter (Signed)
Thanks Hank.  Leonie Man, MD

## 2014-05-21 ENCOUNTER — Telehealth: Payer: Self-pay | Admitting: Cardiology

## 2014-05-21 NOTE — Telephone Encounter (Signed)
Sent to [facility]

## 2014-05-21 NOTE — ED Provider Notes (Signed)
I saw and evaluated the patient, reviewed the resident's note and I agree with the findings and plan.   EKG Interpretation   Date/Time:  Tuesday May 14 2014 09:08:23 EDT Ventricular Rate:  61 PR Interval:  230 QRS Duration: 110 QT Interval:  390 QTC Calculation: 392 R Axis:   9 Text Interpretation:  Sinus rhythm with 1st degree A-V block  Inferior-posterior infarct , age undetermined RSR prime No significant  change since last tracing Confirmed by [facility]  MD, Jenny Reichmann ([id number]) on  05/14/2014 9:56:06 AM     PMH CAD, HPI suspicious for ACS; admit Cards.  Babette Relic, MD 05/21/14 [phone]

## 2014-05-21 NOTE — Telephone Encounter (Signed)
Would like lab results from yesterday morning.

## 2014-05-22 NOTE — Telephone Encounter (Signed)
RN spoke to [employer] PA. RN asked [name] to review labs, can patient restart medication [name] stated labs are okay, restart METFORMIN, CONTINUE TO HOLD

## 2014-05-22 NOTE — Telephone Encounter (Signed)
CONTINUE TO HOD LOSARTAN, DIGOXIN   RN INFORMED WIFE. SHE VERBALIZED UNDERSTANDING. KEEP APPOINTMENT WITH EXTENDER.

## 2014-05-22 NOTE — Telephone Encounter (Signed)
Agree with Mr. Samara Snide.  Leonie Man, MD

## 2014-05-22 NOTE — Telephone Encounter (Signed)
Pt's wife was calling back to get the results to the test that was ran last week and would like to know why he was taken off his BP meds. Please call\  Thanks

## 2014-05-28 NOTE — Progress Notes (Signed)
Spoke to Dr Ellyn Hack. Okay to do a follow up doppler in 12 months instead of 6 months . Will place a recall

## 2014-05-31 ENCOUNTER — Other Ambulatory Visit: Payer: Self-pay | Admitting: Family Medicine

## 2014-06-04 ENCOUNTER — Ambulatory Visit (INDEPENDENT_AMBULATORY_CARE_PROVIDER_SITE_OTHER): Payer: Medicare Other | Admitting: Family Medicine

## 2014-06-04 ENCOUNTER — Encounter: Payer: Self-pay | Admitting: Family Medicine

## 2014-06-04 VITALS — BP 142/88 | HR 64 | Wt 216.0 lb

## 2014-06-04 DIAGNOSIS — I10 Essential (primary) hypertension: Secondary | ICD-10-CM | POA: Diagnosis not present

## 2014-06-04 DIAGNOSIS — E1169 Type 2 diabetes mellitus with other specified complication: Secondary | ICD-10-CM | POA: Diagnosis not present

## 2014-06-04 DIAGNOSIS — E785 Hyperlipidemia, unspecified: Secondary | ICD-10-CM

## 2014-06-04 DIAGNOSIS — E118 Type 2 diabetes mellitus with unspecified complications: Secondary | ICD-10-CM

## 2014-06-04 DIAGNOSIS — I200 Unstable angina: Secondary | ICD-10-CM

## 2014-06-04 DIAGNOSIS — R059 Cough, unspecified: Secondary | ICD-10-CM

## 2014-06-04 DIAGNOSIS — T464X5A Adverse effect of angiotensin-converting-enzyme inhibitors, initial encounter: Secondary | ICD-10-CM

## 2014-06-04 DIAGNOSIS — Z23 Encounter for immunization: Secondary | ICD-10-CM

## 2014-06-04 DIAGNOSIS — E1159 Type 2 diabetes mellitus with other circulatory complications: Secondary | ICD-10-CM

## 2014-06-04 DIAGNOSIS — N529 Male erectile dysfunction, unspecified: Secondary | ICD-10-CM

## 2014-06-04 DIAGNOSIS — R05 Cough: Secondary | ICD-10-CM

## 2014-06-04 DIAGNOSIS — T465X5A Adverse effect of other antihypertensive drugs, initial encounter: Secondary | ICD-10-CM

## 2014-06-04 LAB — POCT GLYCOSYLATED HEMOGLOBIN (HGB A1C): Hemoglobin A1C: 7

## 2014-06-04 NOTE — Progress Notes (Signed)
   Subjective:    Patient ID: [name], male    DOB: Jul 25, 1936, 78 y.o.   MRN: [id number]  HPI He is here for a followup visit. He is recently evaluated the hospital and did have stenting done. The medical record was reviewed. Several of his medications were held due to creatinine of 1.5. This does seem to be improving. He has not been checking his blood sugars regularly. His wife is now involved with helping with that. He is now taking amlodipine and ACTOplus met as well as Zetia and try lipids. He has a previous history of statin intolerance as well as an ACE cough. He was given NTG but is not taking it. He is interested in using Viagra.   Review of Systems     Objective:   Physical Exam Alert and in no distress. Hemoglobin A1c is 7.0      Assessment & Plan:  Type II diabetes mellitus with complication -- CAD, AAA - Plan: Basic Metabolic Panel, POCT glycosylated hemoglobin (Hb A1C)  Hypertension associated with diabetes - Plan: Basic Metabolic Panel  Hyperlipidemia LDL goal <70; statin intolerant  ED (erectile dysfunction) of organic origin  ACE-inhibitor cough  Need for prophylactic vaccination against Streptococcus pneumoniae (pneumococcus) - Plan: Pneumococcal polysaccharide vaccine 23-valent greater than or equal to 2yo subcutaneous/IM  stressed the need for him to take good care of himself in regard to his diabetes care with checking his blood sugars either before meals or after meals. He will continue on his present medications. I will check a b-met on him and probably start him  back on losartan. I think he is a candidate for the new PCSK9 injectable and will start the process for that as well.

## 2014-06-05 LAB — BASIC METABOLIC PANEL
BUN: 22 mg/dL (ref 6–23)
CALCIUM: 9.3 mg/dL (ref 8.4–10.5)
CO2: 23 mEq/L (ref 19–32)
Chloride: 104 mEq/L (ref 96–112)
Creat: 1.37 mg/dL — ABNORMAL HIGH (ref 0.50–1.35)
GLUCOSE: 190 mg/dL — AB (ref 70–99)
Potassium: 4.2 mEq/L (ref 3.5–5.3)
Sodium: 136 mEq/L (ref 135–145)

## 2014-06-05 MED ORDER — LOSARTAN POTASSIUM 50 MG PO TABS: 50.0000 mg | ORAL_TABLET | Freq: Every day | ORAL | Status: DC

## 2014-06-05 NOTE — Addendum Note (Signed)
Addended by: Denita Lung on: 06/05/2014 09:25 AM   Modules accepted: Orders

## 2014-06-05 NOTE — Progress Notes (Signed)
   Subjective:    Patient ID: [name], male    DOB: July 16, 1936, 78 y.o.   MRN: [id number]  HPI    Review of Systems     Objective:   Physical Exam        Assessment & Plan:  His renal function look stable and I will therefore start him back on losartan at a lower dose. I will also start the process to place him on Repatha

## 2014-06-06 ENCOUNTER — Telehealth: Payer: Self-pay | Admitting: Family Medicine

## 2014-06-06 ENCOUNTER — Ambulatory Visit (INDEPENDENT_AMBULATORY_CARE_PROVIDER_SITE_OTHER): Payer: Medicare Other | Admitting: Cardiology

## 2014-06-06 ENCOUNTER — Encounter: Payer: Self-pay | Admitting: Cardiology

## 2014-06-06 VITALS — BP 140/74 | HR 66 | Ht 69.0 in | Wt 211.8 lb

## 2014-06-06 DIAGNOSIS — N183 Chronic kidney disease, stage 3 unspecified: Secondary | ICD-10-CM | POA: Diagnosis not present

## 2014-06-06 DIAGNOSIS — I714 Abdominal aortic aneurysm, without rupture, unspecified: Secondary | ICD-10-CM | POA: Diagnosis not present

## 2014-06-06 DIAGNOSIS — Z9861 Coronary angioplasty status: Secondary | ICD-10-CM | POA: Diagnosis not present

## 2014-06-06 DIAGNOSIS — I200 Unstable angina: Secondary | ICD-10-CM | POA: Diagnosis not present

## 2014-06-06 DIAGNOSIS — E785 Hyperlipidemia, unspecified: Secondary | ICD-10-CM | POA: Diagnosis not present

## 2014-06-06 DIAGNOSIS — I10 Essential (primary) hypertension: Secondary | ICD-10-CM

## 2014-06-06 DIAGNOSIS — E1159 Type 2 diabetes mellitus with other circulatory complications: Secondary | ICD-10-CM

## 2014-06-06 DIAGNOSIS — I2510 Atherosclerotic heart disease of native coronary artery without angina pectoris: Secondary | ICD-10-CM | POA: Diagnosis not present

## 2014-06-06 DIAGNOSIS — E118 Type 2 diabetes mellitus with unspecified complications: Secondary | ICD-10-CM

## 2014-06-06 DIAGNOSIS — E1169 Type 2 diabetes mellitus with other specified complication: Secondary | ICD-10-CM

## 2014-06-06 NOTE — Assessment & Plan Note (Signed)
ARB decreased, PCP following

## 2014-06-06 NOTE — Patient Instructions (Signed)
Your physician wants you to follow-up in: SIX months with Dr.Harding. You will receive a reminder letter in the mail two months in advance. If you don't receive a letter, please call our office to schedule the follow-up appointment.   No changes were made in your therapy today.

## 2014-06-06 NOTE — Assessment & Plan Note (Signed)
Controlled.  

## 2014-06-06 NOTE — Assessment & Plan Note (Signed)
To start [place] per PCP

## 2014-06-06 NOTE — Assessment & Plan Note (Signed)
RCA and OM1 DES 05/17/14. No chest pain since.

## 2014-06-06 NOTE — Progress Notes (Signed)
06/06/2014 Gerrianne Scale   July 15, 1936  [id number]  Primary Physicia [name], MD Primary Cardiologist: Dr Ellyn Hack  HPI:  78 y/o followed for years by Dr Rex Kras, now by Dr Ellyn Hack. He has a history history of significant coronary artery disease status post CABG x2 in 1997. He has had multiple PCIs. He was admitted 05/14/14 with unstable angina. He had a cath followed by stage PCI 05/17/14 two days later secondary to renal insufficiency.  He had an RCA DES placed and an OM1 DES placed. P2y12 was checked and was low, Plavix was continued. During his hospitalization his Toprol was decreased and he was taken off Lanoxin for bradycardia. He did not have an LV gram. His last EF was 55/60% by echo Dec 2013.  He is in the office today for follow up. He denies chest pain since discharge. He has seen Dr Redmond School who is going to start the pt on Repatha. Mr Hoeger expressed some frustration about "not being able to see my doctor" and we discussed this at some length. I reassured him that Dr Ellyn Hack will be is cardiologist long term.     Current Outpatient Prescriptions  Medication Sig Dispense Refill  . amLODipine (NORVASC) 5 MG tablet Take 1 tablet (5 mg total) by mouth daily.  30 tablet  5  . aspirin 81 MG tablet Take 81 mg by mouth daily.      . Choline Fenofibrate (TRILIPIX) 135 MG capsule Take 1 capsule (135 mg total) by mouth daily.  90 capsule  2  . clopidogrel (PLAVIX) 75 MG tablet Take 1 tablet (75 mg total) by mouth daily.  90 tablet  2  . ezetimibe (ZETIA) 10 MG tablet Take 1 tablet (10 mg total) by mouth daily.  90 tablet  1  . ferrous sulfate 325 (65 FE) MG tablet Take 325 mg by mouth daily with breakfast.      . losartan (COZAAR) 50 MG tablet Take 1 tablet (50 mg total) by mouth daily.  90 tablet  3  . methocarbamol (ROBAXIN) 750 MG tablet Take 750 mg by mouth every 6 (six) hours as needed for muscle spasms.       . metoprolol succinate (TOPROL-XL) 25 MG 24 hr tablet Take 1 tablet  (25 mg total) by mouth daily before breakfast.  30 tablet  5  . Multiple Vitamins-Minerals (MULTIVITAMIN WITH MINERALS) tablet Take 1 tablet by mouth daily.       . nitroGLYCERIN (NITROSTAT) 0.4 MG SL tablet Place 1 tablet (0.4 mg total) under the tongue every 5 (five) minutes x 3 doses as needed for chest pain.  25 tablet  12  . pantoprazole (PROTONIX) 40 MG tablet Take 1 tablet (40 mg total) by mouth daily.  90 tablet  3  . pioglitazone-metformin (ACTOPLUS MET) 15-500 MG per tablet TAKE ONE TABLET BY MOUTH ONCE DAILY  90 tablet  0  . prednisoLONE acetate (PRED FORTE) 1 % ophthalmic suspension Place 1 drop into the left eye 4 (four) times daily.       No current facility-administered medications for this visit.    Allergies  Allergen Reactions  . Lisinopril Cough    Rxn: unknown  . Statins Other (See Comments)    Muscle ache  . Welchol [Colesevelam Hcl] Itching    History   Social History  . Marital Status: Married    Spouse Name: N/A    Number of Children: N/A  . Years of Education: N/A   Occupational History  .  Not on file.   Social History Main Topics  . Smoking status: Former Smoker    Types: Cigarettes    Quit date: 10/05/1983  . Smokeless tobacco: Never Used  . Alcohol Use: 1.8 oz/week    3 Cans of beer per week     Comment: almost every day  . Drug Use: No  . Sexual Activity: Not Currently   Other Topics Concern  . Not on file   Social History Narrative   He is married, father of two, grandfather to 7, great grandfather to two.    Not really getting much exercise now, do to his significant back and hip pain.    He does not smoke and only has an alcoholic beverage.      Review of Systems: General: negative for chills, fever, night sweats or weight changes.  Cardiovascular: negative for chest pain, dyspnea on exertion, edema, orthopnea, palpitations, paroxysmal nocturnal dyspnea or shortness of breath Dermatological: negative for rash Respiratory:  negative for cough or wheezing Urologic: negative for hematuria Abdominal: negative for nausea, vomiting, diarrhea, bright red blood per rectum, melena, or hematemesis Neurologic: negative for visual changes, syncope, or dizziness Recent myalgias after his pneumonia vaccine- no fever or chills. All other systems reviewed and are otherwise negative except as noted above.    Blood pressure 140/74, pulse 66, height _0  (1.753 m), weight 211 lb 12.8 oz (96.072 kg).  General appearance: alert, cooperative and no distress Neck: no carotid bruit and no JVD Lungs: clear to auscultation bilaterally Heart: regular rate and rhythm  EKG NSR, NSST changes  ASSESSMENT AND PLAN:   CAD S/P CABG '95- several PCis since, last 05/17/14 RCA and OM1 DES 05/17/14. No chest pain since.   AAA (abdominal aortic aneurysm) without rupture 4.4 cm X 4.4. Cm- 1 yr f/u  Hyperlipidemia LDL goal <70; statin intolerant To start Repatha per PCP   Chronic renal insufficiency, stage III (moderate) ARB decreased, PCP following  Hypertension associated with diabetes Controlled  Type II diabetes mellitus with complication -- CAD, AAA ACTOplus Met   PLAN  Follow up with Dr Ellyn Hack in 6 months. I did not change his medications.   Ringo Sherod KPA-C 06/06/2014 9:21 AM

## 2014-06-06 NOTE — Assessment & Plan Note (Signed)
ACTOplus Met

## 2014-06-06 NOTE — Telephone Encounter (Signed)
Forms completed for Repatha, pt signed & forms faxed

## 2014-06-06 NOTE — Assessment & Plan Note (Signed)
4.4 cm X 4.4. Cm- 1 yr f/u

## 2014-06-06 NOTE — Telephone Encounter (Signed)
Labs & OV included with form

## 2014-06-07 NOTE — Addendum Note (Signed)
Addended by: Vear Clock on: 06/07/2014 12:13 PM   Modules accepted: Orders

## 2014-06-13 ENCOUNTER — Telehealth: Payer: Self-pay | Admitting: Family Medicine

## 2014-06-14 ENCOUNTER — Other Ambulatory Visit: Payer: Self-pay | Admitting: Family Medicine

## 2014-06-14 ENCOUNTER — Ambulatory Visit (INDEPENDENT_AMBULATORY_CARE_PROVIDER_SITE_OTHER): Payer: Medicare Other | Admitting: Family Medicine

## 2014-06-14 ENCOUNTER — Encounter: Payer: Self-pay | Admitting: Family Medicine

## 2014-06-14 VITALS — BP 160/90 | HR 66 | Temp 97.9°F | Wt 212.0 lb

## 2014-06-14 DIAGNOSIS — I200 Unstable angina: Secondary | ICD-10-CM | POA: Diagnosis not present

## 2014-06-14 DIAGNOSIS — R5381 Other malaise: Secondary | ICD-10-CM | POA: Diagnosis not present

## 2014-06-14 DIAGNOSIS — R509 Fever, unspecified: Secondary | ICD-10-CM

## 2014-06-14 DIAGNOSIS — R112 Nausea with vomiting, unspecified: Secondary | ICD-10-CM | POA: Diagnosis not present

## 2014-06-14 DIAGNOSIS — R5383 Other fatigue: Secondary | ICD-10-CM | POA: Diagnosis not present

## 2014-06-14 LAB — POCT URINALYSIS DIPSTICK
Glucose, UA: NEGATIVE
Ketones, UA: NEGATIVE
Nitrite, UA: NEGATIVE
RBC UA: NEGATIVE
SPEC GRAV UA: 1.01
UROBILINOGEN UA: NEGATIVE
pH, UA: 7

## 2014-06-14 LAB — COMPREHENSIVE METABOLIC PANEL
ALT: 162 U/L — AB (ref 0–53)
AST: 135 U/L — ABNORMAL HIGH (ref 0–37)
Albumin: 3.9 g/dL (ref 3.5–5.2)
Alkaline Phosphatase: 57 U/L (ref 39–117)
BILIRUBIN TOTAL: 5.5 mg/dL — AB (ref 0.2–1.2)
BUN: 19 mg/dL (ref 6–23)
CO2: 21 meq/L (ref 19–32)
CREATININE: 1.49 mg/dL — AB (ref 0.50–1.35)
Calcium: 8.6 mg/dL (ref 8.4–10.5)
Chloride: 104 mEq/L (ref 96–112)
Glucose, Bld: 116 mg/dL — ABNORMAL HIGH (ref 70–99)
Potassium: 4.1 mEq/L (ref 3.5–5.3)
Sodium: 136 mEq/L (ref 135–145)
Total Protein: 6.4 g/dL (ref 6.0–8.3)

## 2014-06-14 LAB — CBC WITH DIFFERENTIAL/PLATELET
BASOS ABS: 0 10*3/uL (ref 0.0–0.1)
Basophils Relative: 0 % (ref 0–1)
EOS ABS: 0 10*3/uL (ref 0.0–0.7)
EOS PCT: 0 % (ref 0–5)
HCT: 36.6 % — ABNORMAL LOW (ref 39.0–52.0)
Hemoglobin: 12.5 g/dL — ABNORMAL LOW (ref 13.0–17.0)
LYMPHS ABS: 1.1 10*3/uL (ref 0.7–4.0)
Lymphocytes Relative: 11 % — ABNORMAL LOW (ref 12–46)
MCH: 31.4 pg (ref 26.0–34.0)
MCHC: 34.2 g/dL (ref 30.0–36.0)
MCV: 92 fL (ref 78.0–100.0)
Monocytes Absolute: 0.6 10*3/uL (ref 0.1–1.0)
Monocytes Relative: 6 % (ref 3–12)
Neutro Abs: 8.4 10*3/uL — ABNORMAL HIGH (ref 1.7–7.7)
Neutrophils Relative %: 83 % — ABNORMAL HIGH (ref 43–77)
Platelets: 203 10*3/uL (ref 150–400)
RBC: 3.98 MIL/uL — ABNORMAL LOW (ref 4.22–5.81)
RDW: 15.2 % (ref 11.5–15.5)
WBC: 10.1 10*3/uL (ref 4.0–10.5)

## 2014-06-14 NOTE — Progress Notes (Signed)
   Subjective:    Patient ID: [name], male    DOB: 1936-06-26, 78 y.o.   MRN: [id number]  HPI He has a one-day history of fever, chills, nausea, vomiting, diarrhea, chest tightness. He is also noted that his urine has turned dark yellow but he is noted no change in the color of his stool.   Review of Systems     Objective:   Physical Exam alert and toxic appearing. Tympanic membranes and canals are normal. Throat is clear. Tonsils are normal. Neck is supple without adenopathy or thyromegaly. Sclera appear slightly yellow Cardiac exam shows a regular sinus rhythm without murmurs or gallops. Lungs are clear to auscultation. Abdominal exam shows no hepatosplenomegaly. Urinalysis was yellow with bilirubin and specific gravity of 1.010        Assessment & Plan:  Fever and chills - Plan: POCT Urinalysis Dipstick, CBC with Differential, Comprehensive metabolic panel  Non-intractable vomiting with nausea, vomiting of unspecified type - Plan: POCT Urinalysis Dipstick, CBC with Differential, Comprehensive metabolic panel  Conservative care until I get the blood work back. There is concern for hepatitis.

## 2014-06-17 ENCOUNTER — Ambulatory Visit: Payer: Medicare Other | Admitting: Family Medicine

## 2014-06-17 LAB — HEPATITIS PANEL, ACUTE
HCV AB: NEGATIVE
Hep A IgM: NONREACTIVE
Hep B C IgM: NONREACTIVE
Hepatitis B Surface Ag: NEGATIVE

## 2014-06-17 NOTE — Progress Notes (Signed)
He is feeling somewhat better. The blood work was negative. I conveyed this information to his wife. I explained that this is probably a viral hepatitis He is to return here in several days for recheck.

## 2014-06-19 ENCOUNTER — Telehealth: Payer: Self-pay | Admitting: Family Medicine

## 2014-06-19 ENCOUNTER — Ambulatory Visit (INDEPENDENT_AMBULATORY_CARE_PROVIDER_SITE_OTHER): Payer: Medicare Other | Admitting: Family Medicine

## 2014-06-19 VITALS — BP 130/72 | HR 67 | Wt 209.0 lb

## 2014-06-19 DIAGNOSIS — I200 Unstable angina: Secondary | ICD-10-CM | POA: Diagnosis not present

## 2014-06-19 DIAGNOSIS — B199 Unspecified viral hepatitis without hepatic coma: Secondary | ICD-10-CM | POA: Diagnosis not present

## 2014-06-19 DIAGNOSIS — B338 Other specified viral diseases: Secondary | ICD-10-CM | POA: Diagnosis not present

## 2014-06-19 NOTE — Telephone Encounter (Signed)
   Dorothy called  The injection he received in the mail was evolocumab injection   140mg           prefilled auto injector   By Repatha  sureclick

## 2014-06-19 NOTE — Progress Notes (Signed)
   Subjective:    Patient ID: [name], male    DOB: Sep 21, 1936, 78 y.o.   MRN: [id number]  HPI He is here for recheck. He states that he is feeling much better and has noted a change in the color of his urine to be much lighter. He is also no longer having fever and chills.   Review of Systems     Objective:   Physical Exam Alert and in no distress. Sclera are normal in appearance. Cardiac and lung exam normal. Abdominal exam shows no hepatosplenomegaly.       Assessment & Plan:  Hepatitis in viral disease - Plan: Comprehensive metabolic panel  they'll also concerned over his medications possibly causing his hepatitis and I explained that the fever and chills spoke more towards a viral infection. Did recommend he hold off on starting any medications including the new injectable medicine for cholesterol until his enzymes are back to normal.

## 2014-06-20 LAB — COMPREHENSIVE METABOLIC PANEL
ALT: 93 U/L — ABNORMAL HIGH (ref 0–53)
AST: 54 U/L — ABNORMAL HIGH (ref 0–37)
Albumin: 4 g/dL (ref 3.5–5.2)
Alkaline Phosphatase: 80 U/L (ref 39–117)
BUN: 25 mg/dL — AB (ref 6–23)
CALCIUM: 9.8 mg/dL (ref 8.4–10.5)
CHLORIDE: 101 meq/L (ref 96–112)
CO2: 24 mEq/L (ref 19–32)
CREATININE: 1.18 mg/dL (ref 0.50–1.35)
Glucose, Bld: 167 mg/dL — ABNORMAL HIGH (ref 70–99)
Potassium: 4.6 mEq/L (ref 3.5–5.3)
Sodium: 136 mEq/L (ref 135–145)
Total Bilirubin: 2.2 mg/dL — ABNORMAL HIGH (ref 0.2–1.2)
Total Protein: 6.6 g/dL (ref 6.0–8.3)

## 2014-06-24 ENCOUNTER — Telehealth: Payer: Self-pay | Admitting: Family Medicine

## 2014-06-24 ENCOUNTER — Other Ambulatory Visit: Payer: Self-pay

## 2014-06-24 MED ORDER — GLUCOSE BLOOD VI STRP: 1.0000 | ORAL_STRIP | Freq: Two times a day (BID) | Status: DC

## 2014-06-24 NOTE — Telephone Encounter (Signed)
CALLED [name] HE SAID TRUE TRACK TEST STRIPS SO I HAVE SENT THESE IN

## 2014-06-25 ENCOUNTER — Telehealth: Payer: Self-pay | Admitting: Family Medicine

## 2014-06-25 MED ORDER — GLUCOSE BLOOD VI STRP
1.0000 | ORAL_STRIP | Freq: Two times a day (BID) | Status: DC
Start: 2014-06-25 — End: 2015-12-15

## 2014-06-25 NOTE — Telephone Encounter (Signed)
Fax came over wanting the dx code to bill medicare for test strips

## 2014-06-25 NOTE — Telephone Encounter (Signed)
Called pt & he did receive the Repatha, he has not used it yet.  States was told to wait til after he has some tests ran.  Advised let us know if he needs help/training. S/w Rep and she advised pt is covered thru end of year with Medicare, he will recv. Auto shipment once a month of 2 auto injectors.

## 2014-06-27 ENCOUNTER — Emergency Department (HOSPITAL_COMMUNITY): Payer: Medicare Other

## 2014-06-27 ENCOUNTER — Inpatient Hospital Stay (HOSPITAL_COMMUNITY)
Admission: EM | Admit: 2014-06-27 | Discharge: 2014-07-11 | DRG: 444 | Disposition: A | Discharge status: Home / Self Care | Payer: Medicare Other | Attending: Internal Medicine | Admitting: Internal Medicine

## 2014-06-27 ENCOUNTER — Encounter (HOSPITAL_COMMUNITY): Payer: Self-pay | Admitting: Emergency Medicine

## 2014-06-27 DIAGNOSIS — I5032 Chronic diastolic (congestive) heart failure: Secondary | ICD-10-CM | POA: Diagnosis present

## 2014-06-27 DIAGNOSIS — I2582 Chronic total occlusion of coronary artery: Secondary | ICD-10-CM | POA: Diagnosis present

## 2014-06-27 DIAGNOSIS — R05 Cough: Secondary | ICD-10-CM

## 2014-06-27 DIAGNOSIS — K8020 Calculus of gallbladder without cholecystitis without obstruction: Secondary | ICD-10-CM | POA: Diagnosis not present

## 2014-06-27 DIAGNOSIS — K858 Other acute pancreatitis without necrosis or infection: Secondary | ICD-10-CM

## 2014-06-27 DIAGNOSIS — I429 Cardiomyopathy, unspecified: Secondary | ICD-10-CM | POA: Diagnosis present

## 2014-06-27 DIAGNOSIS — Z9861 Coronary angioplasty status: Secondary | ICD-10-CM | POA: Diagnosis not present

## 2014-06-27 DIAGNOSIS — I714 Abdominal aortic aneurysm, without rupture, unspecified: Secondary | ICD-10-CM

## 2014-06-27 DIAGNOSIS — I493 Ventricular premature depolarization: Secondary | ICD-10-CM | POA: Diagnosis present

## 2014-06-27 DIAGNOSIS — R0789 Other chest pain: Secondary | ICD-10-CM

## 2014-06-27 DIAGNOSIS — R059 Cough, unspecified: Secondary | ICD-10-CM | POA: Diagnosis not present

## 2014-06-27 DIAGNOSIS — R509 Fever, unspecified: Secondary | ICD-10-CM | POA: Diagnosis not present

## 2014-06-27 DIAGNOSIS — I2510 Atherosclerotic heart disease of native coronary artery without angina pectoris: Secondary | ICD-10-CM | POA: Diagnosis present

## 2014-06-27 DIAGNOSIS — N183 Chronic kidney disease, stage 3 unspecified: Secondary | ICD-10-CM | POA: Diagnosis present

## 2014-06-27 DIAGNOSIS — Z7902 Long term (current) use of antithrombotics/antiplatelets: Secondary | ICD-10-CM

## 2014-06-27 DIAGNOSIS — I2119 ST elevation (STEMI) myocardial infarction involving other coronary artery of inferior wall: Secondary | ICD-10-CM | POA: Diagnosis present

## 2014-06-27 DIAGNOSIS — R079 Chest pain, unspecified: Secondary | ICD-10-CM | POA: Diagnosis not present

## 2014-06-27 DIAGNOSIS — K8070 Calculus of gallbladder and bile duct without cholecystitis without obstruction: Secondary | ICD-10-CM | POA: Diagnosis not present

## 2014-06-27 DIAGNOSIS — Z981 Arthrodesis status: Secondary | ICD-10-CM

## 2014-06-27 DIAGNOSIS — K9189 Other postprocedural complications and disorders of digestive system: Secondary | ICD-10-CM | POA: Diagnosis not present

## 2014-06-27 DIAGNOSIS — K830 Cholangitis: Secondary | ICD-10-CM | POA: Diagnosis not present

## 2014-06-27 DIAGNOSIS — E118 Type 2 diabetes mellitus with unspecified complications: Secondary | ICD-10-CM | POA: Diagnosis present

## 2014-06-27 DIAGNOSIS — G4733 Obstructive sleep apnea (adult) (pediatric): Secondary | ICD-10-CM | POA: Diagnosis present

## 2014-06-27 DIAGNOSIS — R109 Unspecified abdominal pain: Secondary | ICD-10-CM | POA: Diagnosis not present

## 2014-06-27 DIAGNOSIS — Z951 Presence of aortocoronary bypass graft: Secondary | ICD-10-CM

## 2014-06-27 DIAGNOSIS — I25810 Atherosclerosis of coronary artery bypass graft(s) without angina pectoris: Secondary | ICD-10-CM | POA: Diagnosis present

## 2014-06-27 DIAGNOSIS — I110 Hypertensive heart disease with heart failure: Secondary | ICD-10-CM | POA: Diagnosis present

## 2014-06-27 DIAGNOSIS — E279 Disorder of adrenal gland, unspecified: Secondary | ICD-10-CM | POA: Diagnosis not present

## 2014-06-27 DIAGNOSIS — Z7982 Long term (current) use of aspirin: Secondary | ICD-10-CM

## 2014-06-27 DIAGNOSIS — A419 Sepsis, unspecified organism: Secondary | ICD-10-CM | POA: Diagnosis not present

## 2014-06-27 DIAGNOSIS — R6521 Severe sepsis with septic shock: Secondary | ICD-10-CM | POA: Diagnosis not present

## 2014-06-27 DIAGNOSIS — K567 Ileus, unspecified: Secondary | ICD-10-CM | POA: Diagnosis not present

## 2014-06-27 DIAGNOSIS — Z79899 Other long term (current) drug therapy: Secondary | ICD-10-CM | POA: Diagnosis not present

## 2014-06-27 DIAGNOSIS — Z87891 Personal history of nicotine dependence: Secondary | ICD-10-CM

## 2014-06-27 DIAGNOSIS — J9811 Atelectasis: Secondary | ICD-10-CM | POA: Diagnosis not present

## 2014-06-27 DIAGNOSIS — I129 Hypertensive chronic kidney disease with stage 1 through stage 4 chronic kidney disease, or unspecified chronic kidney disease: Secondary | ICD-10-CM | POA: Diagnosis present

## 2014-06-27 DIAGNOSIS — M549 Dorsalgia, unspecified: Secondary | ICD-10-CM | POA: Diagnosis present

## 2014-06-27 DIAGNOSIS — K859 Acute pancreatitis without necrosis or infection, unspecified: Secondary | ICD-10-CM | POA: Diagnosis not present

## 2014-06-27 DIAGNOSIS — D649 Anemia, unspecified: Secondary | ICD-10-CM | POA: Diagnosis present

## 2014-06-27 DIAGNOSIS — T82855A Stenosis of coronary artery stent, initial encounter: Secondary | ICD-10-CM | POA: Diagnosis present

## 2014-06-27 DIAGNOSIS — Y848 Other medical procedures as the cause of abnormal reaction of the patient, or of later complication, without mention of misadventure at the time of the procedure: Secondary | ICD-10-CM | POA: Diagnosis not present

## 2014-06-27 DIAGNOSIS — E785 Hyperlipidemia, unspecified: Secondary | ICD-10-CM | POA: Diagnosis present

## 2014-06-27 DIAGNOSIS — K8010 Calculus of gallbladder with chronic cholecystitis without obstruction: Secondary | ICD-10-CM | POA: Diagnosis not present

## 2014-06-27 DIAGNOSIS — Z955 Presence of coronary angioplasty implant and graft: Secondary | ICD-10-CM | POA: Diagnosis not present

## 2014-06-27 DIAGNOSIS — A78 Q fever: Secondary | ICD-10-CM | POA: Diagnosis not present

## 2014-06-27 DIAGNOSIS — I200 Unstable angina: Secondary | ICD-10-CM | POA: Diagnosis present

## 2014-06-27 DIAGNOSIS — K828 Other specified diseases of gallbladder: Secondary | ICD-10-CM | POA: Diagnosis not present

## 2014-06-27 DIAGNOSIS — T464X5A Adverse effect of angiotensin-converting-enzyme inhibitors, initial encounter: Secondary | ICD-10-CM

## 2014-06-27 DIAGNOSIS — I259 Chronic ischemic heart disease, unspecified: Secondary | ICD-10-CM | POA: Diagnosis not present

## 2014-06-27 DIAGNOSIS — K838 Other specified diseases of biliary tract: Secondary | ICD-10-CM | POA: Diagnosis not present

## 2014-06-27 DIAGNOSIS — I25119 Atherosclerotic heart disease of native coronary artery with unspecified angina pectoris: Secondary | ICD-10-CM

## 2014-06-27 DIAGNOSIS — I252 Old myocardial infarction: Secondary | ICD-10-CM | POA: Diagnosis not present

## 2014-06-27 DIAGNOSIS — G8929 Other chronic pain: Secondary | ICD-10-CM | POA: Diagnosis present

## 2014-06-27 DIAGNOSIS — R7989 Other specified abnormal findings of blood chemistry: Secondary | ICD-10-CM | POA: Diagnosis not present

## 2014-06-27 DIAGNOSIS — K8050 Calculus of bile duct without cholangitis or cholecystitis without obstruction: Secondary | ICD-10-CM | POA: Diagnosis not present

## 2014-06-27 DIAGNOSIS — K8309 Other cholangitis: Secondary | ICD-10-CM

## 2014-06-27 DIAGNOSIS — E119 Type 2 diabetes mellitus without complications: Secondary | ICD-10-CM | POA: Diagnosis present

## 2014-06-27 HISTORY — DX: Diverticulitis of large intestine without perforation or abscess without bleeding: K57.32

## 2014-06-27 LAB — CBC
HCT: 35.7 % — ABNORMAL LOW (ref 39.0–52.0)
HEMOGLOBIN: 12 g/dL — AB (ref 13.0–17.0)
MCH: 30.8 pg (ref 26.0–34.0)
MCHC: 33.6 g/dL (ref 30.0–36.0)
MCV: 91.5 fL (ref 78.0–100.0)
Platelets: 298 10*3/uL (ref 150–400)
RBC: 3.9 MIL/uL — ABNORMAL LOW (ref 4.22–5.81)
RDW: 14.8 % (ref 11.5–15.5)
WBC: 9.1 10*3/uL (ref 4.0–10.5)

## 2014-06-27 LAB — BASIC METABOLIC PANEL
Anion gap: 15 (ref 5–15)
BUN: 16 mg/dL (ref 6–23)
CO2: 23 mEq/L (ref 19–32)
Calcium: 9.5 mg/dL (ref 8.4–10.5)
Chloride: 103 mEq/L (ref 96–112)
Creatinine, Ser: 1.27 mg/dL (ref 0.50–1.35)
GFR calc Af Amer: 62 mL/min — ABNORMAL LOW (ref >90)
GFR calc non Af Amer: 53 mL/min — ABNORMAL LOW (ref >90)
GLUCOSE: 174 mg/dL — AB (ref 70–99)
Potassium: 4.6 mEq/L (ref 3.7–5.3)
Sodium: 141 mEq/L (ref 137–147)

## 2014-06-27 LAB — HEPATIC FUNCTION PANEL
ALBUMIN: 3.2 g/dL — AB (ref 3.5–5.2)
ALT: 89 U/L — ABNORMAL HIGH (ref 0–53)
AST: 57 U/L — ABNORMAL HIGH (ref 0–37)
Alkaline Phosphatase: 123 U/L — ABNORMAL HIGH (ref 39–117)
BILIRUBIN DIRECT: 2 mg/dL — AB (ref 0.0–0.3)
Indirect Bilirubin: 1 mg/dL — ABNORMAL HIGH (ref 0.3–0.9)
Total Bilirubin: 3 mg/dL — ABNORMAL HIGH (ref 0.3–1.2)
Total Protein: 7.3 g/dL (ref 6.0–8.3)

## 2014-06-27 LAB — URINALYSIS, ROUTINE W REFLEX MICROSCOPIC
Glucose, UA: NEGATIVE mg/dL
Hgb urine dipstick: NEGATIVE
KETONES UR: 15 mg/dL — AB
NITRITE: NEGATIVE
PH: 5 (ref 5.0–8.0)
Protein, ur: NEGATIVE mg/dL
SPECIFIC GRAVITY, URINE: 1.026 (ref 1.005–1.030)
Urobilinogen, UA: 1 mg/dL (ref 0.0–1.0)

## 2014-06-27 LAB — I-STAT TROPONIN, ED
Troponin i, poc: 0 ng/mL (ref 0.00–0.08)
Troponin i, poc: 0 ng/mL (ref 0.00–0.08)

## 2014-06-27 LAB — URINE MICROSCOPIC-ADD ON

## 2014-06-27 LAB — PRO B NATRIURETIC PEPTIDE: Pro B Natriuretic peptide (BNP): 333 pg/mL (ref 0–450)

## 2014-06-27 MED ORDER — MORPHINE SULFATE 4 MG/ML IJ SOLN
4.0000 mg | Freq: Once | INTRAMUSCULAR | Status: AC
  Administered 2014-06-27: 4 mg via INTRAVENOUS
  Filled 2014-06-27: qty 1

## 2014-06-27 MED ORDER — NITROGLYCERIN 0.4 MG SL SUBL
0.4000 mg | SUBLINGUAL_TABLET | SUBLINGUAL | Status: DC | PRN
  Administered 2014-06-27 (×2): 0.4 mg via SUBLINGUAL
  Filled 2014-06-27 (×2): qty 1

## 2014-06-27 MED ORDER — ONDANSETRON HCL 4 MG/2ML IJ SOLN
4.0000 mg | Freq: Once | INTRAMUSCULAR | Status: AC
  Administered 2014-06-27: 4 mg via INTRAVENOUS
  Filled 2014-06-27: qty 2

## 2014-06-27 MED ORDER — ASPIRIN 325 MG PO TABS
325.0000 mg | ORAL_TABLET | ORAL | Status: AC
  Administered 2014-06-27: 325 mg via ORAL
  Filled 2014-06-27: qty 1

## 2014-06-27 NOTE — ED Notes (Signed)
Dr. Linker at bedside  

## 2014-06-27 NOTE — ED Notes (Signed)
Pt. reports left chest pain with mild SOB and occasional dry cough and diaphoresis onset Tues day this week , history of CABG and coronary stent , his cardiologist is Dr. Ellyn Hack .

## 2014-06-27 NOTE — H&P (Addendum)
Admit date: 06/27/2014 Referring Physician:  Dr. Canary Brim Primary Cardiologist:  Dr. Ellyn Hack Chief complaint/reason for admission:Chest pain  HPI: This is a 78 y/o followed for years by Dr Rex Kras, now by Dr Ellyn Hack. He has a history history of significant coronary artery disease status post CABG x2 in 1997. He has had multiple PCIs. He was admitted 05/14/14 with unstable angina. He had a cath followed by stage PCI 05/17/14 two days later secondary to renal insufficiency. He had an RCA DES placed and an OM1 DES placed. P2y12 was checked and was low, Plavix was continued. During his hospitalization his Toprol was decreased and he was taken off Lanoxin for bradycardia. He did not have an LV gram. His last EF was 55/60% by echo Dec 2013. He was seen in the office 06/06/14 for follow up. He denied chest pain at that time. He says that he got the Pneumococcal vaccine a few weeks ago and has not felt well since then with cough and chills.  He has had some sweats but no documented fevers.  He denies coughing up anything.  Over the past 3 days he has had intermittent chest tightness over the left chest identical to his typical angina except it has not radiated down the left arm.  Tonight it became worse and he presented to the ER with 10/10 pain which resolved after 3 SL NTG and morphine.  He has chronic DOE that he stated is stable.  Cardiology is now asked to evaluate patient for admission for USAP.    PMH:    Past Medical History  Diagnosis Date  . History of: ST elevation myocardial infarction (STEMI) involving left circumflex coronary artery with complication 5784    PTCA-circumflex; PCI in 1991  . CAD, multiple vessel 1985-2010    Most recent cath 01/01/09: 100% Occluded LAD after SP1, patent LIMA-distal LAD with apical 90% lesion.  Cx-OM1 widely patent stent into proximal bifurcating OM1 . Follow-on Cx => 70-80% -- non-amenable PCI. Widely patent 3 overlapping stents in midRCA with only 40% RPL stenosis;  SVG- OM known to be occluded.  . S/P CABG x 2 1997    LIMA-LAD, SVG-OM  . H/O unstable angina 06/2003, 12/2006,    '04: Staged Taxus DES PCI to RCA and circumflex-OM;;'08 - PCI to proximal ISR in RCA - Cypher DES  . History of nuclear stress test December 2013    LOW at Risk. Moderate region of mid to basal inferolateral scar without ischemia -- consistent with distal circumflex disease. Mild apical hypokinesis with an EF of 47%.  . Irregular heart beat     PVCs and PACs, no arrhythmia recorded  . Exertional dyspnea     Chronic baseline SOB with ambulation  . Sleep apnea     pt. states he was told to return for f/u, to be fitted for CPAp, but pt. reports that he didn't follow up  . AAA (abdominal aortic aneurysm) 08/30/12    Doppler 08/30/12 had a small amount of growth. It was a 4.2 x 4.3 cm greater in size than the previous one noted at 4 x 3.8.  . Diabetes mellitus     Not on oral medication  . Hypertension   . Dyslipidemia, goal LDL below 70   . Osteoarthritis of both knees     And back; multiple back surgeries, right knee arthroplasty and left knee arthroscopic surgery x2  . Chronic back pain      multiple surgeries; C-spine and lumbar  . Erectile  dysfunction   . H/O: pneumonia February 14  . Chronic anemia     On iron supplement; history of positive guaiac - negative colonoscopy in 1996.; Thought to be related to hemorrhoids; status post hemorrhoidectomy  . Ankle edema     Chronic    PSH:    Past Surgical History  Procedure Laterality Date  . Coronary angioplasty  1017,5102    1985 lateral STEMI Circumflex PTCA  . Anterior lat lumbar fusion Left 11/22/2012    Procedure: ANTERIOR LATERAL LUMBAR FUSION 1 LEVEL;  Surgeon: Eustace Moore, MD;  Location: [facility];  Service: Neurosurgery;  Laterality: Left;  Anterior Lateral Lumbar Fusion Lumbar Three-Four  . Lumbar percutaneous pedicle screw 1 level N/A 11/22/2012    Procedure: LUMBAR PERCUTANEOUS PEDICLE SCREW 1 LEVEL;   Surgeon: Eustace Moore, MD;  Location: [facility];  Service: Neurosurgery;  Laterality: N/A;  Lumbar Three-Four Percutaneous Pedicle Screw, Lateral approach  . Back surgery  1979 & x 10    pt. remarks, "I have had about 10 back surgeries"  . Posterior cervical fusion/foraminotomy N/A 05/02/2013    Procedure: POSTERIOR CERVICAL FUSION/FORAMINOTOMY CERVICAL SEVEN THORACIC-ONE;  Surgeon: Eustace Moore, MD;  Location: [facility];  Service: Neurosurgery;  Laterality: N/A;  POSTERIOR CERVICAL FUSION/FORAMINOTOMY CERVICAL SEVEN THORACIC-ONE  . Coronary artery bypass graft  1990    LIMA-LAD, SVG-OM  . Cardiac catheterization  September 2004    None Occluded vein graft to OM; diffuse RCA disease in the mid vessel, 80% circumflex-OM stenosis; follow on AV groove circumflex with sequential 90% stenoses and intervening saccular dilation   . Percutaneous coronary stent intervention (pci-s)  September 2004    PCI - RCA 2 overlapping Taxus DES 2.75 mm x 32 mm and 2.75 mm x 12 mm (3.0 mm); PCI-Cx-OM1 - Taxus DES 3.0 mm x 20 mm (3.1 mm);   Marland Kitchen Percutaneous coronary stent intervention (pci-s)  March 2008    80% ISR in proximal Taxus stent in RCA -- covered proximally with Cypher DES 3.0 mm x 12 mm  . Cardiac catheterization  March 2010    4 abnormal Myoview showing apical thinning (possibly due to apical LAD 95%) : 100% Occluded LAD after SV1, distal LAD grafted via LIMA -apical 95% . Cx -OM1 w/patent stent extending into OM 1 . Follow on Cx - 70-80% - non-amenable PCI. RCA widely patent 3 overlapping stents in mRCA w/less than 40% stenosisin RPL; SVG-OM known occluded   . Cervical discectomy  04/23/10    Decompressive anterior carvical diskectomy. C4-5, C6-7  . Cervical arthrodesis  04/23/10    Anterior cervical arthrodesis, C4-5, C6-7 utilizing 7-mm PEEK interbody cage packed with local autograft & Antifuse putty at C4-5 & an 8-mm cage at C6-7.  Marland Kitchen Anterior cervical plating  04/23/10    At C4-5 and a C6-7 utilizing  two separate Biomet MaxAn plates.  . Colonoscopy  1996  . Minor hemorrhoidectomy    . Total knee arthroplasty Right   . Knee arthroscopy Left     x 2  . Nm myoview ltd  December 2013    LOW RISK. Mmoderate region of mid to basal inferolateral scar without ischemia. Mild apical hypokinesis with an EF of 47%.  . Transthoracic echocardiogram  December 2013    EF 55-60%. moderate LA dilation. Aortic Sclerosis  . Abdominal and lower extremity arterial ultrasound  08/23/2012; 10/10/2013    Normal ABIs. Nonocclusive lower extremity disease. 4.2 cm x 4.3 cm infrarenal AAA;; 4.4  cm x 4.3 cm (essentially stable)   . Cataract extraction      ALLERGIES:   Lisinopril; Statins; and Welchol  Prior to Admit Meds:   (Not in a hospital admission) Family HX:    Family History  Problem Relation Age of Onset  . Arthritis Mother   . Diabetes Father   . Heart disease Father   . Stroke Sister   . Hypertension Sister   . Heart disease Sister   . Diabetes Sister   . Breast cancer Sister   . Heart disease Brother   . Ulcers Brother   . Colon cancer Neg Hx    Social HX:    History   Social History  . Marital Status: Married    Spouse Name: N/A    Number of Children: N/A  . Years of Education: N/A   Occupational History  . Not on file.   Social History Main Topics  . Smoking status: Former Smoker    Types: Cigarettes    Quit date: 10/05/1983  . Smokeless tobacco: Never Used  . Alcohol Use: 1.8 oz/week    3 Cans of beer per week     Comment: almost every day  . Drug Use: No  . Sexual Activity: Not Currently   Other Topics Concern  . Not on file   Social History Narrative   He is married, father of two, grandfather to 34, great grandfather to two.    Not really getting much exercise now, do to his significant back and hip pain.    He does not smoke and only has an alcoholic beverage.      ROS:  All 11 ROS were addressed and are negative except what is stated in the HPI  PHYSICAL  EXAM Filed Vitals:   06/27/14 2315  BP: 132/60  Pulse: 67  Temp:   Resp: 19   General: Well developed, well nourished, in no acute distress Head: Eyes PERRLA, No xanthomas.   Normal cephalic and atramatic  Lungs:   Clear bilaterally to auscultation and percussion. Heart:   HRRR S1 S2 Pulses are 2+ & equal.            No carotid bruit. No JVD.  No abdominal bruits. No femoral bruits. Abdomen: Bowel sounds are positive, abdomen soft and non-tender without masses Extremities:   No clubbing, cyanosis or edema.  DP +1 Neuro: Alert and oriented X 3. Psych:  Good affect, responds appropriately   Labs:   Lab Results  Component Value Date   WBC 9.1 06/27/2014   HGB 12.0* 06/27/2014   HCT 35.7* 06/27/2014   MCV 91.5 06/27/2014   PLT 298 06/27/2014    Recent Labs Lab 06/27/14 1954  NA 141  K 4.6  CL 103  CO2 23  BUN 16  CREATININE 1.27  CALCIUM 9.5  PROT 7.3  BILITOT 3.0*  ALKPHOS 123*  ALT 89*  AST 57*  GLUCOSE 174*   Lab Results  Component Value Date   CKTOTAL 247* 12/16/2008   CKMB 6.4* 12/16/2008   TROPONINI <0.30 05/15/2014   No results found for this basename: PTT   Lab Results  Component Value Date   INR 1.06 05/16/2014   INR 1.09 05/15/2014   INR 1.03 05/01/2013     Lab Results  Component Value Date   CHOL 212* 05/15/2014   CHOL 195 08/17/2013   CHOL 238* 06/18/2013   Lab Results  Component Value Date   HDL 36* 05/15/2014   HDL  40 08/17/2013   HDL 35* 06/18/2013   Lab Results  Component Value Date   LDLCALC 124* 05/15/2014   LDLCALC 127* 08/17/2013   LDLCALC 149* 06/18/2013   Lab Results  Component Value Date   TRIG 262* 05/15/2014   TRIG 138 08/17/2013   TRIG 271* 06/18/2013   Lab Results  Component Value Date   CHOLHDL 5.9 05/15/2014   CHOLHDL 4.9 08/17/2013   CHOLHDL 6.8 06/18/2013   No results found for this basename: LDLDIRECT      Radiology:  No results found.  EKG:  NSR with IRBBB, inferior infarct, nonspecific T wave abnormality,  PVC's  ASSESSMENT:  1.  Chest pain fairly typical of his anginal pain except this time it did not radiate down his arm.  The quality of pain is identical to his typical angina and has been off and on for 3 days and on admit was 8/10 and resolved after SL NTG x 3 and morphine.  EKG unchanged from prior.  Cardiac enzymes neg x 1.  He has had exertional SOB but that is chronic and at his baseline. 2.  ASCAD s/p remote CABG with LIMA to LAD and SVG to OM 1997 with subsequent PTCA left circ 1991 with cath at that time showing occluded SVG to OM, PCI of the RCA and PCI of OM/circ 2004 then PCI to prox RCA for ISR 2008 and most recently staged PCI of IRS of mid OM and IRS of prox RCA8/2015.  On DAPT.  P2Y12 low at 112. 3.  PVC's 4.  OSA - not on CPAP 5.  HTN - controlled 6.  Dyslipidemia - last LDL 8/15 not at goal 7.  Type II DM    PLAN:   1.  Admit to Tele bed 2.  Cycle cardiac enzymes 3.  Continue DAPT/Statin/BB 4.  NPO after MN for possible Cath in am  5.  IV Heparin gtt until rule out complete 6.  IV NTG gtt 7.  Hold Metformin  Sueanne Margarita, MD  06/27/2014  11:46 PM

## 2014-06-27 NOTE — ED Provider Notes (Signed)
CSN: [id number]     Arrival date & time 06/27/14  1942 History   First MD Initiated Contact with Patient 06/27/14 1949     Chief Complaint  Patient presents with  . Chest Pain     (Consider location/radiation/quality/duration/timing/severity/associated sxs/prior Treatment) HPI Pt presenting with c/o chest pain.  Pt has hx of CAD and recently had cath last month with 2 stents placed for instent restenosis.  Pt states he has been having chest pain over the past 2 days intermittently.  Pain is on left side of chest, radiates to shoulder.  Pain is dull and associated with diaphoresis.  Has also had some dry cough occasionally which makes pain worse.  No fever.  Over the past several weeks he had fever/chills, vomiting and diarrhea and was worked up by his PMD for hepatitis- liver enzymes were elevated, but had begun to trend down, vomiting and diarrhea had resolved.  There are no other associated systemic symptoms, there are no other alleviating or modifying factors.   Past Medical History  Diagnosis Date  . History of: ST elevation myocardial infarction (STEMI) involving left circumflex coronary artery with complication 4098    PTCA-circumflex; PCI in 1991  . CAD, multiple vessel 1985-2010    Most recent cath 01/01/09: 100% Occluded LAD after SP1, patent LIMA-distal LAD with apical 90% lesion.  Cx-OM1 widely patent stent into proximal bifurcating OM1 . Follow-on Cx => 70-80% -- non-amenable PCI. Widely patent 3 overlapping stents in midRCA with only 40% RPL stenosis; SVG- OM known to be occluded.  . S/P CABG x 2 1997    LIMA-LAD, SVG-OM  . H/O unstable angina 06/2003, 12/2006, 05/2014    a) '04: Staged Taxus DES PCI to RCA and Cx-OM;;'08 - PCI to proximal ISR in RCA - Cypher DES; c) 05/2014: ISR in RCA & OM1, PCI with Promus P DES: RCA  2.75 x 12, OM1 3.0 mm x 8)  . History of nuclear stress test December 2013    LOW at Risk. Moderate region of mid to basal inferolateral scar without ischemia --  consistent with distal circumflex disease. Mild apical hypokinesis with an EF of 47%.  . Irregular heart beat     PVCs and PACs, no arrhythmia recorded  . Exertional dyspnea     Chronic baseline SOB with ambulation  . Sleep apnea     pt. states he was told to return for f/u, to be fitted for CPAp, but pt. reports that he didn't follow up  . AAA (abdominal aortic aneurysm) 08/30/12    Doppler 08/30/12 had a small amount of growth. It was a 4.2 x 4.3 cm greater in size than the previous one noted at 4 x 3.8.  . Diabetes mellitus     Not on oral medication  . Hypertension   . Dyslipidemia, goal LDL below 70   . Osteoarthritis of both knees     And back; multiple back surgeries, right knee arthroplasty and left knee arthroscopic surgery x2  . Chronic back pain      multiple surgeries; C-spine and lumbar  . Erectile dysfunction   . H/O: pneumonia February 14  . Chronic anemia     On iron supplement; history of positive guaiac - negative colonoscopy in 1996.; Thought to be related to hemorrhoids; status post hemorrhoidectomy  . Ankle edema     Chronic   Past Surgical History  Procedure Laterality Date  . Coronary angioplasty  1191,4782    1985 lateral STEMI Circumflex PTCA  .  Anterior lat lumbar fusion Left 11/22/2012    Procedure: ANTERIOR LATERAL LUMBAR FUSION 1 LEVEL;  Surgeon: Eustace Moore, MD;  Location: [facility];  Service: Neurosurgery;  Laterality: Left;  Anterior Lateral Lumbar Fusion Lumbar Three-Four  . Lumbar percutaneous pedicle screw 1 level N/A 11/22/2012    Procedure: LUMBAR PERCUTANEOUS PEDICLE SCREW 1 LEVEL;  Surgeon: Eustace Moore, MD;  Location: [facility];  Service: Neurosurgery;  Laterality: N/A;  Lumbar Three-Four Percutaneous Pedicle Screw, Lateral approach  . Back surgery  1979 & x 10    pt. remarks, "I have had about 10 back surgeries"  . Posterior cervical fusion/foraminotomy N/A 05/02/2013    Procedure: POSTERIOR CERVICAL FUSION/FORAMINOTOMY CERVICAL SEVEN  THORACIC-ONE;  Surgeon: Eustace Moore, MD;  Location: [facility];  Service: Neurosurgery;  Laterality: N/A;  POSTERIOR CERVICAL FUSION/FORAMINOTOMY CERVICAL SEVEN THORACIC-ONE  . Coronary artery bypass graft  1990    LIMA-LAD, SVG-OM  . Cardiac catheterization  September 2004    None Occluded vein graft to OM; diffuse RCA disease in the mid vessel, 80% circumflex-OM stenosis; follow on AV groove circumflex with sequential 90% stenoses and intervening saccular dilation   . Percutaneous coronary stent intervention (pci-s)  September 2004    PCI - RCA 2 overlapping Taxus DES 2.75 mm x 32 mm and 2.75 mm x 12 mm (3.0 mm); PCI-Cx-OM1 - Taxus DES 3.0 mm x 20 mm (3.1 mm);   Marland Kitchen Percutaneous coronary stent intervention (pci-s)  March 2008    80% ISR in proximal Taxus stent in RCA -- covered proximally with Cypher DES 3.0 mm x 12 mm  . Cardiac catheterization  March 2010    4 abnormal Myoview showing apical thinning (possibly due to apical LAD 95%) : 100% Occluded LAD after SV1, distal LAD grafted via LIMA -apical 95% . Cx -OM1 w/patent stent extending into OM 1 . Follow on Cx - 70-80% - non-amenable PCI. RCA widely patent 3 overlapping stents in mRCA w/less than 40% stenosisin RPL; SVG-OM known occluded   . Cervical discectomy  04/23/10    Decompressive anterior carvical diskectomy. C4-5, C6-7  . Cervical arthrodesis  04/23/10    Anterior cervical arthrodesis, C4-5, C6-7 utilizing 7-mm PEEK interbody cage packed with local autograft & Antifuse putty at C4-5 & an 8-mm cage at C6-7.  Marland Kitchen Anterior cervical plating  04/23/10    At C4-5 and a C6-7 utilizing two separate Biomet MaxAn plates.  . Colonoscopy  1996  . Minor hemorrhoidectomy    . Total knee arthroplasty Right   . Knee arthroscopy Left     x 2  . Nm myoview ltd  December 2013    LOW RISK. Mmoderate region of mid to basal inferolateral scar without ischemia. Mild apical hypokinesis with an EF of 47%.  . Transthoracic echocardiogram  December 2013     EF 55-60%. moderate LA dilation. Aortic Sclerosis  . Abdominal and lower extremity arterial ultrasound  08/23/2012; 10/10/2013    Normal ABIs. Nonocclusive lower extremity disease. 4.2 cm x 4.3 cm infrarenal AAA;; 4.4 cm x 4.3 cm (essentially stable)   . Cataract extraction     Family History  Problem Relation Age of Onset  . Arthritis Mother   . Diabetes Father   . Heart disease Father   . Stroke Sister   . Hypertension Sister   . Heart disease Sister   . Diabetes Sister   . Breast cancer Sister   . Heart disease Brother   . Ulcers Brother   .  Colon cancer Neg Hx    History  Substance Use Topics  . Smoking status: Former Smoker    Types: Cigarettes    Quit date: 10/05/1983  . Smokeless tobacco: Never Used  . Alcohol Use: 1.8 oz/week    3 Cans of beer per week     Comment: almost every day    Review of Systems ROS reviewed and all otherwise negative except for mentioned in HPI    Allergies  Lisinopril; Statins; and Welchol  Home Medications   Prior to Admission medications   Medication Sig Start Date End Date Taking? Authorizing Provider  acetaminophen (TYLENOL) 500 MG tablet Take 1,000 mg by mouth once.   Yes Historical Provider, MD  aspirin 81 MG tablet Take 81 mg by mouth daily.   Yes Historical Provider, MD  ferrous sulfate 325 (65 FE) MG tablet Take 325 mg by mouth daily with breakfast.   Yes Historical Provider, MD  Multiple Vitamins-Minerals (MULTIVITAMIN WITH MINERALS) tablet Take 1 tablet by mouth daily.    Yes Historical Provider, MD  pioglitazone-metformin (ACTOPLUS MET) 15-500 MG per tablet Take 1 tablet by mouth daily.   Yes Historical Provider, MD  glucose blood test strip 1 each by Other route 2 (two) times daily. THIS IS FOR THE TRUE TRACK METERUse as instructed 06/25/14   Denita Lung, MD   BP 124/56  Pulse 64  Temp(Src) 98.9 F (37.2 C) (Oral)  Resp 16  Ht '5\' 9"'  (1.753 m)  Wt 204 lb 8 oz (92.761 kg)  BMI 30.19 kg/m2  SpO2 97% Vitals  reviewed Physical Exam Physical Examination: General appearance - alert, well appearing, and in no distress Mental status - alert, oriented to person, place, and time Eyes - no conjunctival injection, mild scleral icterus Mouth - mucous membranes moist, pharynx normal without lesions Chest - clear to auscultation, no wheezes, rales or rhonchi, symmetric air entry Heart - normal rate, regular rhythm, normal S1, S2, no murmurs, rubs, clicks or gallops Abdomen - soft, nontender, nondistended, no masses or organomegaly Extremities - peripheral pulses normal, no pedal edema, no clubbing or cyanosis Skin - normal coloration and turgor, no rashes  ED Course  Procedures (including critical care time)  11:44 PM d/w Dr. Radford Pax, cardiology, she will come to see patient in the ED.    12:27 AM cardiology has seen patient, they are admitting him, placing on nitro drip and heparin drip.  Possible cath tomorrow.    CRITICAL CARE Performed by: Threasa Beards Total critical care time: 35 Critical care time was exclusive of separately billable procedures and treating other patients. Critical care was necessary to treat or prevent imminent or life-threatening deterioration. Critical care was time spent personally by me on the following activities: development of treatment plan with patient and/or surrogate as well as nursing, discussions with consultants, evaluation of patient's response to treatment, examination of patient, obtaining history from patient or surrogate, ordering and performing treatments and interventions, ordering and review of laboratory studies, ordering and review of radiographic studies, pulse oximetry and re-evaluation of patient's condition.  Labs Review Labs Reviewed  CBC - Abnormal; Notable for the following:    RBC 3.90 (*)    Hemoglobin 12.0 (*)    HCT 35.7 (*)    All other components within normal limits  BASIC METABOLIC PANEL - Abnormal; Notable for the following:     Glucose, Bld 174 (*)    GFR calc non Af Amer 53 (*)    GFR calc Af Wyvonnia Lora  62 (*)    All other components within normal limits  URINALYSIS, ROUTINE W REFLEX MICROSCOPIC - Abnormal; Notable for the following:    Color, Urine ORANGE (*)    APPearance CLOUDY (*)    Bilirubin Urine MODERATE (*)    Ketones, ur 15 (*)    Leukocytes, UA SMALL (*)    All other components within normal limits  HEPATIC FUNCTION PANEL - Abnormal; Notable for the following:    Albumin 3.2 (*)    AST 57 (*)    ALT 89 (*)    Alkaline Phosphatase 123 (*)    Total Bilirubin 3.0 (*)    Bilirubin, Direct 2.0 (*)    Indirect Bilirubin 1.0 (*)    All other components within normal limits  GLUCOSE, CAPILLARY - Abnormal; Notable for the following:    Glucose-Capillary 117 (*)    All other components within normal limits  COMPREHENSIVE METABOLIC PANEL - Abnormal; Notable for the following:    Glucose, Bld 132 (*)    Albumin 2.7 (*)    AST 44 (*)    ALT 72 (*)    Total Bilirubin 2.7 (*)    GFR calc non Af Amer 56 (*)    GFR calc Af Amer 65 (*)    Anion gap 16 (*)    All other components within normal limits  CBC WITH DIFFERENTIAL - Abnormal; Notable for the following:    RBC 3.25 (*)    Hemoglobin 10.8 (*)    HCT 29.8 (*)    MCHC 36.2 (*)    All other components within normal limits  PROTIME-INR - Abnormal; Notable for the following:    Prothrombin Time 15.5 (*)    All other components within normal limits  APTT - Abnormal; Notable for the following:    aPTT 84 (*)    All other components within normal limits  PLATELET INHIBITION P2Y12 - Abnormal; Notable for the following:    Platelet Function  P2Y12 145 (*)    All other components within normal limits  CBC - Abnormal; Notable for the following:    RBC 3.61 (*)    Hemoglobin 11.4 (*)    HCT 32.8 (*)    All other components within normal limits  GLUCOSE, CAPILLARY - Abnormal; Notable for the following:    Glucose-Capillary 130 (*)    All other components  within normal limits  GLUCOSE, CAPILLARY - Abnormal; Notable for the following:    Glucose-Capillary 125 (*)    All other components within normal limits  GLUCOSE, CAPILLARY - Abnormal; Notable for the following:    Glucose-Capillary 136 (*)    All other components within normal limits  GLUCOSE, CAPILLARY - Abnormal; Notable for the following:    Glucose-Capillary 179 (*)    All other components within normal limits  PRO B NATRIURETIC PEPTIDE  URINE MICROSCOPIC-ADD ON  MAGNESIUM  TROPONIN I  TROPONIN I  TROPONIN I  OCCULT BLOOD X 1 CARD TO LAB, STOOL  CBC  OCCULT BLOOD X 1 CARD TO LAB, STOOL  CBC  I-STAT TROPOININ, ED  I-STAT TROPOININ, ED    Imaging Review Dg Chest 2 View  06/27/2014   CLINICAL DATA:  Chest pain, fever, and cough for 3 days  EXAM: CHEST  2 VIEW  COMPARISON:  May 14, 2014  FINDINGS: The heart size and mediastinal contours are stable. Patient status post prior median sternotomy and CABG. Both lungs are clear. The visualized skeletal structures are stable.  IMPRESSION: No active  cardiopulmonary disease.   Electronically Signed   By: Abelardo Diesel M.D.   On: 06/27/2014 21:43     EKG Interpretation   Date/Time:  Thursday June 27 2014 19:47:59 EDT Ventricular Rate:  75 PR Interval:  192 QRS Duration: 114 QT Interval:  372 QTC Calculation: 415 R Axis:   17 Text Interpretation:  Sinus rhythm with occasional Premature ventricular  complexes Incomplete right bundle branch block Inferior infarct , age  undetermined Abnormal ECG No significant change since last tracing  Confirmed by [facility]  MD, MARTHA [phone]) on 06/27/2014 10:03:13 PM      MDM   Unstable angina transaminitis Elevated bilirubin  Pt presenting with c/o chest apin, hx of CAD and multiple stents as well as CABG.  EKG reassuring without changes, intiial troponin negative. Pt is chest pain free after 3 nitro and morphine in the ED. Seen by cardiology and they are planning to admit, requests  heparin drip and nitro drip.  Bilirubin is 3 tonight up from 2.2- max of 5.  Pt has no abdominal pain, hepatitis panel drawn by PMD was negative. Abdominal ultrasound ordered to further evaluate although the primary emergent process at this time is his unstable angina.    Threasa Beards, MD 06/28/14 1739

## 2014-06-28 ENCOUNTER — Encounter (HOSPITAL_COMMUNITY)
Admission: EM | Disposition: A | Discharge status: Home / Self Care | Payer: Self-pay | Source: Home / Self Care | Attending: Cardiology

## 2014-06-28 ENCOUNTER — Inpatient Hospital Stay (HOSPITAL_COMMUNITY): Payer: Medicare Other

## 2014-06-28 ENCOUNTER — Other Ambulatory Visit: Payer: Self-pay | Admitting: Cardiology

## 2014-06-28 ENCOUNTER — Encounter (HOSPITAL_COMMUNITY): Payer: Self-pay | Admitting: *Deleted

## 2014-06-28 DIAGNOSIS — R7989 Other specified abnormal findings of blood chemistry: Secondary | ICD-10-CM | POA: Diagnosis not present

## 2014-06-28 DIAGNOSIS — R05 Cough: Secondary | ICD-10-CM | POA: Diagnosis not present

## 2014-06-28 DIAGNOSIS — G4733 Obstructive sleep apnea (adult) (pediatric): Secondary | ICD-10-CM | POA: Diagnosis present

## 2014-06-28 DIAGNOSIS — I714 Abdominal aortic aneurysm, without rupture, unspecified: Secondary | ICD-10-CM | POA: Diagnosis not present

## 2014-06-28 DIAGNOSIS — K838 Other specified diseases of biliary tract: Secondary | ICD-10-CM | POA: Diagnosis not present

## 2014-06-28 DIAGNOSIS — R079 Chest pain, unspecified: Secondary | ICD-10-CM | POA: Diagnosis present

## 2014-06-28 DIAGNOSIS — E118 Type 2 diabetes mellitus with unspecified complications: Secondary | ICD-10-CM | POA: Diagnosis present

## 2014-06-28 DIAGNOSIS — I2510 Atherosclerotic heart disease of native coronary artery without angina pectoris: Secondary | ICD-10-CM

## 2014-06-28 DIAGNOSIS — R109 Unspecified abdominal pain: Secondary | ICD-10-CM | POA: Diagnosis not present

## 2014-06-28 DIAGNOSIS — K859 Acute pancreatitis without necrosis or infection, unspecified: Secondary | ICD-10-CM | POA: Diagnosis not present

## 2014-06-28 DIAGNOSIS — R0789 Other chest pain: Secondary | ICD-10-CM | POA: Diagnosis not present

## 2014-06-28 DIAGNOSIS — R6521 Severe sepsis with septic shock: Secondary | ICD-10-CM | POA: Diagnosis not present

## 2014-06-28 DIAGNOSIS — A78 Q fever: Secondary | ICD-10-CM | POA: Diagnosis not present

## 2014-06-28 DIAGNOSIS — Z7902 Long term (current) use of antithrombotics/antiplatelets: Secondary | ICD-10-CM | POA: Diagnosis not present

## 2014-06-28 DIAGNOSIS — I493 Ventricular premature depolarization: Secondary | ICD-10-CM | POA: Diagnosis present

## 2014-06-28 DIAGNOSIS — K8050 Calculus of bile duct without cholangitis or cholecystitis without obstruction: Secondary | ICD-10-CM | POA: Diagnosis not present

## 2014-06-28 DIAGNOSIS — A419 Sepsis, unspecified organism: Secondary | ICD-10-CM | POA: Diagnosis not present

## 2014-06-28 DIAGNOSIS — D649 Anemia, unspecified: Secondary | ICD-10-CM | POA: Diagnosis present

## 2014-06-28 DIAGNOSIS — I429 Cardiomyopathy, unspecified: Secondary | ICD-10-CM | POA: Diagnosis present

## 2014-06-28 DIAGNOSIS — K8070 Calculus of gallbladder and bile duct without cholecystitis without obstruction: Secondary | ICD-10-CM | POA: Diagnosis not present

## 2014-06-28 DIAGNOSIS — E279 Disorder of adrenal gland, unspecified: Secondary | ICD-10-CM | POA: Diagnosis not present

## 2014-06-28 DIAGNOSIS — N183 Chronic kidney disease, stage 3 (moderate): Secondary | ICD-10-CM | POA: Diagnosis present

## 2014-06-28 DIAGNOSIS — K567 Ileus, unspecified: Secondary | ICD-10-CM | POA: Diagnosis not present

## 2014-06-28 DIAGNOSIS — K8010 Calculus of gallbladder with chronic cholecystitis without obstruction: Secondary | ICD-10-CM | POA: Diagnosis not present

## 2014-06-28 DIAGNOSIS — Y848 Other medical procedures as the cause of abnormal reaction of the patient, or of later complication, without mention of misadventure at the time of the procedure: Secondary | ICD-10-CM | POA: Diagnosis present

## 2014-06-28 DIAGNOSIS — Z955 Presence of coronary angioplasty implant and graft: Secondary | ICD-10-CM | POA: Diagnosis not present

## 2014-06-28 DIAGNOSIS — K8020 Calculus of gallbladder without cholecystitis without obstruction: Secondary | ICD-10-CM | POA: Diagnosis not present

## 2014-06-28 DIAGNOSIS — I200 Unstable angina: Secondary | ICD-10-CM | POA: Diagnosis present

## 2014-06-28 DIAGNOSIS — R6520 Severe sepsis without septic shock: Secondary | ICD-10-CM | POA: Diagnosis not present

## 2014-06-28 DIAGNOSIS — Z981 Arthrodesis status: Secondary | ICD-10-CM | POA: Diagnosis not present

## 2014-06-28 DIAGNOSIS — K830 Cholangitis: Secondary | ICD-10-CM | POA: Diagnosis not present

## 2014-06-28 DIAGNOSIS — I129 Hypertensive chronic kidney disease with stage 1 through stage 4 chronic kidney disease, or unspecified chronic kidney disease: Secondary | ICD-10-CM | POA: Diagnosis present

## 2014-06-28 DIAGNOSIS — I252 Old myocardial infarction: Secondary | ICD-10-CM | POA: Diagnosis not present

## 2014-06-28 DIAGNOSIS — Z951 Presence of aortocoronary bypass graft: Secondary | ICD-10-CM | POA: Diagnosis not present

## 2014-06-28 DIAGNOSIS — I25810 Atherosclerosis of coronary artery bypass graft(s) without angina pectoris: Secondary | ICD-10-CM | POA: Diagnosis present

## 2014-06-28 DIAGNOSIS — K828 Other specified diseases of gallbladder: Secondary | ICD-10-CM | POA: Diagnosis not present

## 2014-06-28 DIAGNOSIS — M549 Dorsalgia, unspecified: Secondary | ICD-10-CM | POA: Diagnosis present

## 2014-06-28 DIAGNOSIS — Z79899 Other long term (current) drug therapy: Secondary | ICD-10-CM | POA: Diagnosis not present

## 2014-06-28 DIAGNOSIS — Z87891 Personal history of nicotine dependence: Secondary | ICD-10-CM | POA: Diagnosis not present

## 2014-06-28 DIAGNOSIS — K858 Other acute pancreatitis: Secondary | ICD-10-CM | POA: Diagnosis not present

## 2014-06-28 DIAGNOSIS — G8929 Other chronic pain: Secondary | ICD-10-CM | POA: Diagnosis present

## 2014-06-28 DIAGNOSIS — J9811 Atelectasis: Secondary | ICD-10-CM | POA: Diagnosis not present

## 2014-06-28 DIAGNOSIS — Z9861 Coronary angioplasty status: Secondary | ICD-10-CM | POA: Diagnosis not present

## 2014-06-28 DIAGNOSIS — I2582 Chronic total occlusion of coronary artery: Secondary | ICD-10-CM | POA: Diagnosis present

## 2014-06-28 DIAGNOSIS — Z7982 Long term (current) use of aspirin: Secondary | ICD-10-CM | POA: Diagnosis not present

## 2014-06-28 DIAGNOSIS — K9189 Other postprocedural complications and disorders of digestive system: Secondary | ICD-10-CM | POA: Diagnosis not present

## 2014-06-28 DIAGNOSIS — I5032 Chronic diastolic (congestive) heart failure: Secondary | ICD-10-CM | POA: Diagnosis not present

## 2014-06-28 DIAGNOSIS — E785 Hyperlipidemia, unspecified: Secondary | ICD-10-CM | POA: Diagnosis present

## 2014-06-28 HISTORY — PX: LEFT HEART CATHETERIZATION WITH CORONARY/GRAFT ANGIOGRAM: SHX5450

## 2014-06-28 LAB — PROTIME-INR
INR: 1.23 (ref 0.00–1.49)
PROTHROMBIN TIME: 15.5 s — AB (ref 11.6–15.2)

## 2014-06-28 LAB — COMPREHENSIVE METABOLIC PANEL
ALT: 72 U/L — ABNORMAL HIGH (ref 0–53)
ANION GAP: 16 — AB (ref 5–15)
AST: 44 U/L — AB (ref 0–37)
Albumin: 2.7 g/dL — ABNORMAL LOW (ref 3.5–5.2)
Alkaline Phosphatase: 104 U/L (ref 39–117)
BILIRUBIN TOTAL: 2.7 mg/dL — AB (ref 0.3–1.2)
BUN: 16 mg/dL (ref 6–23)
CHLORIDE: 102 meq/L (ref 96–112)
CO2: 20 meq/L (ref 19–32)
CREATININE: 1.22 mg/dL (ref 0.50–1.35)
Calcium: 8.7 mg/dL (ref 8.4–10.5)
GFR, EST AFRICAN AMERICAN: 65 mL/min — AB (ref >90)
GFR, EST NON AFRICAN AMERICAN: 56 mL/min — AB (ref >90)
Glucose, Bld: 132 mg/dL — ABNORMAL HIGH (ref 70–99)
Potassium: 4.1 mEq/L (ref 3.7–5.3)
Sodium: 138 mEq/L (ref 137–147)
Total Protein: 6.3 g/dL (ref 6.0–8.3)

## 2014-06-28 LAB — CBC
HEMATOCRIT: 32.8 % — AB (ref 39.0–52.0)
Hemoglobin: 11.4 g/dL — ABNORMAL LOW (ref 13.0–17.0)
MCH: 31.6 pg (ref 26.0–34.0)
MCHC: 34.8 g/dL (ref 30.0–36.0)
MCV: 90.9 fL (ref 78.0–100.0)
Platelets: 235 10*3/uL (ref 150–400)
RBC: 3.61 MIL/uL — ABNORMAL LOW (ref 4.22–5.81)
RDW: 14.6 % (ref 11.5–15.5)
WBC: 6.4 10*3/uL (ref 4.0–10.5)

## 2014-06-28 LAB — CBC WITH DIFFERENTIAL/PLATELET
BASOS ABS: 0.1 10*3/uL (ref 0.0–0.1)
BASOS PCT: 1 % (ref 0–1)
EOS ABS: 0 10*3/uL (ref 0.0–0.7)
EOS PCT: 0 % (ref 0–5)
HCT: 29.8 % — ABNORMAL LOW (ref 39.0–52.0)
Hemoglobin: 10.8 g/dL — ABNORMAL LOW (ref 13.0–17.0)
LYMPHS ABS: 1.3 10*3/uL (ref 0.7–4.0)
Lymphocytes Relative: 15 % (ref 12–46)
MCH: 33.2 pg (ref 26.0–34.0)
MCHC: 36.2 g/dL — AB (ref 30.0–36.0)
MCV: 91.7 fL (ref 78.0–100.0)
Monocytes Absolute: 0.9 10*3/uL (ref 0.1–1.0)
Monocytes Relative: 10 % (ref 3–12)
NEUTROS PCT: 74 % (ref 43–77)
Neutro Abs: 6.5 10*3/uL (ref 1.7–7.7)
Platelets: 233 10*3/uL (ref 150–400)
RBC: 3.25 MIL/uL — ABNORMAL LOW (ref 4.22–5.81)
RDW: 14.9 % (ref 11.5–15.5)
WBC: 8.8 10*3/uL (ref 4.0–10.5)

## 2014-06-28 LAB — GLUCOSE, CAPILLARY
GLUCOSE-CAPILLARY: 136 mg/dL — AB (ref 70–99)
GLUCOSE-CAPILLARY: 179 mg/dL — AB (ref 70–99)
GLUCOSE-CAPILLARY: 132 mg/dL — AB (ref 70–99)
Glucose-Capillary: 117 mg/dL — ABNORMAL HIGH (ref 70–99)
Glucose-Capillary: 130 mg/dL — ABNORMAL HIGH (ref 70–99)
Glucose-Capillary: 125 mg/dL — ABNORMAL HIGH (ref 70–99)

## 2014-06-28 LAB — PLATELET INHIBITION P2Y12: Platelet Function  P2Y12: 145 [PRU] — ABNORMAL LOW (ref 194–418)

## 2014-06-28 LAB — TROPONIN I
Troponin I: <0.3 ng/mL (ref <0.30)
Troponin I: <0.3 ng/mL (ref <0.30)
Troponin I: <0.3 ng/mL (ref <0.30)

## 2014-06-28 LAB — APTT: aPTT: 84 seconds — ABNORMAL HIGH (ref 24–37)

## 2014-06-28 LAB — POCT ACTIVATED CLOTTING TIME: Activated Clotting Time: 129 seconds

## 2014-06-28 LAB — MAGNESIUM: Magnesium: 1.9 mg/dL (ref 1.5–2.5)

## 2014-06-28 SURGERY — LEFT HEART CATHETERIZATION WITH CORONARY/GRAFT ANGIOGRAM
Anesthesia: LOCAL

## 2014-06-28 MED ORDER — NITROGLYCERIN 0.4 MG SL SUBL
0.4000 mg | SUBLINGUAL_TABLET | SUBLINGUAL | Status: DC | PRN
  Administered 2014-06-28: 0.4 mg via SUBLINGUAL
  Filled 2014-06-28: qty 1

## 2014-06-28 MED ORDER — ASPIRIN 81 MG PO TABS: 81.0000 mg | ORAL_TABLET | Freq: Every day | ORAL | Status: DC

## 2014-06-28 MED ORDER — PANTOPRAZOLE SODIUM 40 MG PO TBEC
40.0000 mg | DELAYED_RELEASE_TABLET | Freq: Every day | ORAL | Status: DC
  Administered 2014-06-28 – 2014-07-10 (×13): 40 mg via ORAL
  Filled 2014-06-28 (×13): qty 1

## 2014-06-28 MED ORDER — ONDANSETRON HCL 4 MG/2ML IJ SOLN: 4.0000 mg | Freq: Four times a day (QID) | INTRAMUSCULAR | Status: DC | PRN

## 2014-06-28 MED ORDER — NITROGLYCERIN 0.4 MG SL SUBL: 0.4000 mg | SUBLINGUAL_TABLET | SUBLINGUAL | Status: DC | PRN

## 2014-06-28 MED ORDER — ASPIRIN 81 MG PO CHEW: 81.0000 mg | CHEWABLE_TABLET | ORAL | Status: DC

## 2014-06-28 MED ORDER — MIDAZOLAM HCL 2 MG/2ML IJ SOLN
INTRAMUSCULAR | Status: AC
  Filled 2014-06-28: qty 2

## 2014-06-28 MED ORDER — FERROUS SULFATE 325 (65 FE) MG PO TABS
325.0000 mg | ORAL_TABLET | Freq: Every day | ORAL | Status: DC
  Administered 2014-06-28 – 2014-07-04 (×7): 325 mg via ORAL
  Filled 2014-06-28 (×9): qty 1

## 2014-06-28 MED ORDER — NITROGLYCERIN IN D5W 200-5 MCG/ML-% IV SOLN
3.0000 ug/min | INTRAVENOUS | Status: DC
  Administered 2014-06-28: 3 ug/min via INTRAVENOUS
  Filled 2014-06-28: qty 250

## 2014-06-28 MED ORDER — ASPIRIN 81 MG PO CHEW: 324.0000 mg | CHEWABLE_TABLET | ORAL | Status: AC

## 2014-06-28 MED ORDER — ONDANSETRON HCL 4 MG/2ML IJ SOLN
4.0000 mg | Freq: Four times a day (QID) | INTRAMUSCULAR | Status: DC | PRN
  Administered 2014-06-30 – 2014-07-03 (×2): 4 mg via INTRAVENOUS
  Filled 2014-06-28 (×2): qty 2

## 2014-06-28 MED ORDER — CLOPIDOGREL BISULFATE 75 MG PO TABS
75.0000 mg | ORAL_TABLET | Freq: Every day | ORAL | Status: DC
  Administered 2014-06-28 – 2014-07-11 (×14): 75 mg via ORAL
  Filled 2014-06-28 (×14): qty 1

## 2014-06-28 MED ORDER — ASPIRIN EC 81 MG PO TBEC
81.0000 mg | DELAYED_RELEASE_TABLET | Freq: Every day | ORAL | Status: DC
  Administered 2014-06-29 – 2014-07-11 (×13): 81 mg via ORAL
  Filled 2014-06-28 (×14): qty 1

## 2014-06-28 MED ORDER — NITROGLYCERIN 1 MG/10 ML FOR IR/CATH LAB
INTRA_ARTERIAL | Status: AC
  Filled 2014-06-28: qty 10

## 2014-06-28 MED ORDER — NITROGLYCERIN IN D5W 200-5 MCG/ML-% IV SOLN: 2.0000 ug/min | INTRAVENOUS | Status: DC

## 2014-06-28 MED ORDER — SODIUM CHLORIDE 0.9 % IV SOLN: INTRAVENOUS | Status: DC

## 2014-06-28 MED ORDER — SODIUM CHLORIDE 0.9 % IV SOLN
INTRAVENOUS | Status: DC
  Administered 2014-06-28: 04:00:00 via INTRAVENOUS

## 2014-06-28 MED ORDER — EZETIMIBE 10 MG PO TABS
10.0000 mg | ORAL_TABLET | Freq: Every day | ORAL | Status: DC
  Administered 2014-06-28 – 2014-07-11 (×14): 10 mg via ORAL
  Filled 2014-06-28 (×15): qty 1

## 2014-06-28 MED ORDER — FENTANYL CITRATE 0.05 MG/ML IJ SOLN
INTRAMUSCULAR | Status: AC
  Filled 2014-06-28: qty 2

## 2014-06-28 MED ORDER — SODIUM CHLORIDE 0.9 % IV SOLN
INTRAVENOUS | Status: DC
  Administered 2014-06-29: 06:00:00 via INTRAVENOUS

## 2014-06-28 MED ORDER — SODIUM CHLORIDE 0.9 % IV SOLN: 250.0000 mL | INTRAVENOUS | Status: DC | PRN

## 2014-06-28 MED ORDER — METOPROLOL SUCCINATE ER 25 MG PO TB24
25.0000 mg | ORAL_TABLET | Freq: Every day | ORAL | Status: DC
  Filled 2014-06-28 (×2): qty 1

## 2014-06-28 MED ORDER — ISOSORBIDE MONONITRATE ER 30 MG PO TB24
30.0000 mg | ORAL_TABLET | Freq: Every day | ORAL | Status: DC
  Administered 2014-06-28 – 2014-07-11 (×14): 30 mg via ORAL
  Filled 2014-06-28 (×15): qty 1

## 2014-06-28 MED ORDER — FENOFIBRATE 160 MG PO TABS
160.0000 mg | ORAL_TABLET | Freq: Every day | ORAL | Status: DC
  Administered 2014-06-28 – 2014-07-11 (×12): 160 mg via ORAL
  Filled 2014-06-28 (×15): qty 1

## 2014-06-28 MED ORDER — SODIUM CHLORIDE 0.9 % IJ SOLN: 3.0000 mL | INTRAMUSCULAR | Status: DC | PRN

## 2014-06-28 MED ORDER — SODIUM CHLORIDE 0.9 % IJ SOLN
3.0000 mL | Freq: Two times a day (BID) | INTRAMUSCULAR | Status: DC
  Administered 2014-06-28: 3 mL via INTRAVENOUS

## 2014-06-28 MED ORDER — ACETAMINOPHEN 325 MG PO TABS: 650.0000 mg | ORAL_TABLET | ORAL | Status: DC | PRN

## 2014-06-28 MED ORDER — ASPIRIN 300 MG RE SUPP: 300.0000 mg | RECTAL | Status: AC

## 2014-06-28 MED ORDER — HEPARIN (PORCINE) IN NACL 2-0.9 UNIT/ML-% IJ SOLN
INTRAMUSCULAR | Status: AC
  Filled 2014-06-28: qty 1000

## 2014-06-28 MED ORDER — MORPHINE SULFATE 2 MG/ML IJ SOLN
2.0000 mg | INTRAMUSCULAR | Status: DC | PRN
  Administered 2014-06-28 – 2014-07-03 (×7): 2 mg via INTRAVENOUS
  Filled 2014-06-28 (×8): qty 1

## 2014-06-28 MED ORDER — LIDOCAINE HCL (PF) 1 % IJ SOLN
INTRAMUSCULAR | Status: AC
  Filled 2014-06-28: qty 30

## 2014-06-28 MED ORDER — GI COCKTAIL ~~LOC~~
30.0000 mL | Freq: Once | ORAL | Status: AC
  Administered 2014-06-28: 30 mL via ORAL
  Filled 2014-06-28: qty 30

## 2014-06-28 MED ORDER — METOPROLOL SUCCINATE ER 25 MG PO TB24
25.0000 mg | ORAL_TABLET | Freq: Every day | ORAL | Status: DC
  Administered 2014-06-28 – 2014-07-11 (×14): 25 mg via ORAL
  Filled 2014-06-28 (×15): qty 1

## 2014-06-28 MED ORDER — ACETAMINOPHEN 325 MG PO TABS
650.0000 mg | ORAL_TABLET | ORAL | Status: DC | PRN
  Filled 2014-06-28: qty 2

## 2014-06-28 MED ORDER — AMLODIPINE BESYLATE 5 MG PO TABS
5.0000 mg | ORAL_TABLET | Freq: Every day | ORAL | Status: DC
  Administered 2014-06-28 – 2014-07-11 (×14): 5 mg via ORAL
  Filled 2014-06-28 (×15): qty 1

## 2014-06-28 MED ORDER — HEPARIN (PORCINE) IN NACL 100-0.45 UNIT/ML-% IJ SOLN
1250.0000 [IU]/h | INTRAMUSCULAR | Status: DC
  Administered 2014-06-28: 1250 [IU]/h via INTRAVENOUS
  Filled 2014-06-28 (×2): qty 250

## 2014-06-28 MED ORDER — HEPARIN BOLUS VIA INFUSION
4000.0000 [IU] | Freq: Once | INTRAVENOUS | Status: AC
  Administered 2014-06-28: 4000 [IU] via INTRAVENOUS
  Filled 2014-06-28: qty 4000

## 2014-06-28 NOTE — Progress Notes (Signed)
Upon am assessment, pt c/o 6/10 chest pain. EKG done and O2 appliled at [place]. BP 140/66 IV nitro increased to 3cc /hr with some relief. Dr. Ellyn Hack on floor and saw EKG. Pt still complaining of pain. IV nitro increased to 5cc/hr, bp 136/64 SR in the 60s. After 5 minutes, pain still persistent with Nitro gtt increased to 6cc/hr. BP stable at 130/60, SR in the 60s. Dr Ellyn Hack spoke with cath lab to get pt in sooner. Pt transported with cath team to lab and aware of above situation.

## 2014-06-28 NOTE — Progress Notes (Signed)
ANTICOAGULATION CONSULT NOTE - Initial Consult  Pharmacy Consult for heparin Indication: chest pain/ACS  Allergies  Allergen Reactions  . Lisinopril Cough    Rxn: unknown  . Statins Other (See Comments)    Muscle ache  . Welchol [Colesevelam Hcl] Itching    Patient Measurements: Height: '5\' 9"'  (175.3 cm) Weight: 204 lb 8 oz (92.761 kg) IBW/kg (Calculated) : 70.7 Heparin Dosing Weight: 90kg  Vital Signs: Temp: 98.5 F (36.9 C) (09/25 0138) Temp src: Oral (09/25 0138) BP: 141/80 mmHg (09/25 0138) Pulse Rate: 70 (09/25 0138)  Labs:  Recent Labs  06/27/14 1954  HGB 12.0*  HCT 35.7*  PLT 298  CREATININE 1.27    Estimated Creatinine Clearance: 55.6 ml/min (by C-G formula based on Cr of 1.27).   Medical History: Past Medical History  Diagnosis Date  . History of: ST elevation myocardial infarction (STEMI) involving left circumflex coronary artery with complication 3818    PTCA-circumflex; PCI in 1991  . CAD, multiple vessel 1985-2010    Most recent cath 01/01/09: 100% Occluded LAD after SP1, patent LIMA-distal LAD with apical 90% lesion.  Cx-OM1 widely patent stent into proximal bifurcating OM1 . Follow-on Cx => 70-80% -- non-amenable PCI. Widely patent 3 overlapping stents in midRCA with only 40% RPL stenosis; SVG- OM known to be occluded.  . S/P CABG x 2 1997    LIMA-LAD, SVG-OM  . H/O unstable angina 06/2003, 12/2006,    '04: Staged Taxus DES PCI to RCA and circumflex-OM;;'08 - PCI to proximal ISR in RCA - Cypher DES  . History of nuclear stress test December 2013    LOW at Risk. Moderate region of mid to basal inferolateral scar without ischemia -- consistent with distal circumflex disease. Mild apical hypokinesis with an EF of 47%.  . Irregular heart beat     PVCs and PACs, no arrhythmia recorded  . Exertional dyspnea     Chronic baseline SOB with ambulation  . Sleep apnea     pt. states he was told to return for f/u, to be fitted for CPAp, but pt. reports that he  didn't follow up  . AAA (abdominal aortic aneurysm) 08/30/12    Doppler 08/30/12 had a small amount of growth. It was a 4.2 x 4.3 cm greater in size than the previous one noted at 4 x 3.8.  . Diabetes mellitus     Not on oral medication  . Hypertension   . Dyslipidemia, goal LDL below 70   . Osteoarthritis of both knees     And back; multiple back surgeries, right knee arthroplasty and left knee arthroscopic surgery x2  . Chronic back pain      multiple surgeries; C-spine and lumbar  . Erectile dysfunction   . H/O: pneumonia February 14  . Chronic anemia     On iron supplement; history of positive guaiac - negative colonoscopy in 1996.; Thought to be related to hemorrhoids; status post hemorrhoidectomy  . Ankle edema     Chronic    Medications:  Prescriptions prior to admission  Medication Sig Dispense Refill  . acetaminophen (TYLENOL) 500 MG tablet Take 1,000 mg by mouth once.      Marland Kitchen aspirin 81 MG tablet Take 81 mg by mouth daily.      . ferrous sulfate 325 (65 FE) MG tablet Take 325 mg by mouth daily with breakfast.      . Multiple Vitamins-Minerals (MULTIVITAMIN WITH MINERALS) tablet Take 1 tablet by mouth daily.       Marland Kitchen  pioglitazone-metformin (ACTOPLUS MET) 15-500 MG per tablet Take 1 tablet by mouth daily.      Marland Kitchen glucose blood test strip 1 each by Other route 2 (two) times daily. THIS IS FOR THE TRUE TRACK METERUse as instructed  100 each  12   Scheduled:  . amLODipine  5 mg Oral Daily  . aspirin  324 mg Oral NOW   Or  . aspirin  300 mg Rectal NOW  . [START ON 06/29/2014] aspirin  81 mg Oral Pre-Cath  . [START ON 06/29/2014] aspirin EC  81 mg Oral Daily  . aspirin  81 mg Oral Daily  . clopidogrel  75 mg Oral Daily  . ezetimibe  10 mg Oral Daily  . fenofibrate  160 mg Oral Daily  . ferrous sulfate  325 mg Oral Q breakfast  . metoprolol succinate  25 mg Oral QAC breakfast  . pantoprazole  40 mg Oral Daily  . sodium chloride  3 mL Intravenous Q12H   Infusions:  . [START  ON 06/29/2014] sodium chloride    . nitroGLYCERIN      Assessment: 78yo male c/o CP w/ mild SOB and diaphoresis since Tuesday, has prior cardiac hx, initial troponin negative, to begin heparin.  Goal of Therapy:  Heparin level 0.3-0.7 units/ml Monitor platelets by anticoagulation protocol: Yes   Plan:  Will give heparin 4000 units IV bolus x1 followed by gtt at 1250 units/hr and monitor heparin levels and CBC.  Wynona Neat, PharmD, BCPS  06/28/2014,2:01 AM

## 2014-06-28 NOTE — Progress Notes (Signed)
Upon rounding, pt complaining of 3/10 chest pain. Dr Ellyn Hack on floor and notified. Stated to give him morphine. Order received and administered. Will closely monitor

## 2014-06-28 NOTE — Progress Notes (Signed)
Pt called out with complaints of 5/10 mid sternal chest pain. I called Dr. Ellyn Hack immediately and got a return phone call. He asked that I give the pt a SL NTG (IV Nitro d/c'd) and a GI cocktail. BP 122/53, NSR in the 70s. After medications administered per ordered, pt had relief of chest pain. Dr Ellyn Hack made aware. Will continue to follow through with orders. Pts right groin an level 0. Will closely monitor

## 2014-06-28 NOTE — Progress Notes (Signed)
Pts chest and shoulders over the course of the day appear to be more jaundice than this morning when I assessed him. Plan for abdominal ultrasound this pm. I notified [name] and orders for CMET in am. Will continue to monitor

## 2014-06-28 NOTE — CV Procedure (Addendum)
[name] is a 78 y.o. male    [id number]  [id number] LOCATION:  FACILITY: [place]  PHYSICIAN: Troy Sine, MD, [facility] [birth year]   DATE OF PROCEDURE:  06/28/2014    CARDIAC CATHETERIZATION     HISTORY:    [name] is a 78 y.o. male who is status post CABG surgery x2 in 1997 with a LIMA graft to the LAD and a vein graft to the obtuse marginal vessel.  He is undergone prior interventions.  He is a former patient of Dr. Rex Kras and now is followed by Dr. Ellyn Hack.  Most recently in August 2015 he was found to have stenosis in his obtuse marginal vessel beyond the previously placed stent and in-stent restenosis in the proximal RCA.  He has been documented to have an occluded graft to the circumflex vessel.  He underwent successful two-vessel PCI with stenting to the obtuse marginal and proximal RCA on 05/17/2014 by Dr. Tamala Julian.  He was readmitted to the hospital yesterday with recurrent chest pain.  This morning he had increasing chest pain.  He now is referred for definitive repeat cardiac catheterization.   PROCEDURE: Left heart catheterization: Coronary angiography native coronary arteries, saphenous vein graft, and left internal mammary artery; left ventriculography.  The patient was brought to the [facility] cardiac catherization labaratory in the fasting state. He  was premedicated with Versed 2 mg and fentanyl 50 mcg. His right groin was prepped and shaved in usual sterile fashion. Xylocaine 1% was used for local anesthesia. A 5 French sheath was inserted into the R femoral artery. Diagnostic catheterizatiion was done with 5 [place] FL4, FR4, LIMA and pigtail catheters. Left ventriculography was done with 25 cc Omnipaque contrast. Hemostasis was obtained by direct manual compression. The patient tolerated the procedure well.   HEMODYNAMICS:   Central Aorta: 120/57   Left Ventricle: 120/9  ANGIOGRAPHY:  Left main: Angiographically normal vessel, which bifurcated into the  LAD and left circumflex coronary artery.   LAD: Mildly irregularity proximally, and then was totally occluded after the septal perforating artery and small diagonal vessel proximally.  Left circumflex: There was a widely patent stent in the circumflex vessel commencing proximal to the AV groove and extending into the midportion of the obtuse marginal vessel.  The stent was widely patent.  There was no in-stent restenosis.  The AV groove circumflex which arose from the stented segment was small caliber and had 80% ostial stenosis followed by an ectatic segment and it was very small caliber beyond the area of ectasia.   Right coronary artery: Widely patent.  Tandem stance commencing proximally and extending in to the region of the acute margin without in-stent restenosis.  The recent prior intervention site was widely patent.  The RCA supplied the PDA vessel.  There was mild 10-20% narrowing in the continuation branch beyond the PDA takeoff.  LIMA to LAD: Widely patent anastomosis into the mid LAD.  There is filling of the LAD retrograde to the proximal occluded segment of the LAD beyond the anastomosis was widely patent and extended to the apex.  SVG to OM: Occluded at the ostium, which is old   Left ventriculography revealed  ejection fraction of approximately 45%.  There is moderate region of mid inferior hypocontractility.  Contrast used: 70 cc Omnipaque   IMPRESSION:  Mild LV dysfunction with a moderate region of the inferior hypocontractility and ejection fraction of 45%.  Native coronary artery disease with total occlusion of the LAD after the septal  perforating and diagonal vessel; widely patent stent extending from the circumflex proximal to the AV groove branch into the midportion of the OM vessel, and evidence for 80% ostial stenosis in the AV groove circumflex with diffusely ectatic segment.  Beyond this stenosis and a small caliber AV groove vessel which is old; widely patent stents  in the RCA extending proximally to the acute margin, without restenosis, and mild luminal narrowing of 20% in the continuation branch of the distal RCA after the PDA takeoff.  RECOMMENDATION:  Medical therapy.  The recent sites of PCI done in August 2015 including the proximal RCA and obtuse marginal branch are widely patent without restenosis.  There is no change in the previous diffusely diseased AV groove circumflex, which is a small-caliber vessel with a prominent region of ectasia.   Troy Sine, MD, [facility] 06/28/2014 10:04 AM

## 2014-06-28 NOTE — Progress Notes (Signed)
Site area: Right groin Site Prior to Removal:  Level 0 Pressure Applied For:20 minutes Manual:  yes  Patient Status During Pull:  Stable Post Pull Site:  Level 0 Post Pull Instructions Given:  yes Post Pull Pulses Present: yes and remained palpable during sheath removal Dressing Applied:  Tegaderm Bedrest begins @ 1287 Comments:No complications

## 2014-06-28 NOTE — Progress Notes (Signed)
78 y/o followed for years by Dr Rex Kras, now by Dr Ellyn Hack. He has a history history of significant coronary artery disease status post CABG x2 in 1997. He has had multiple PCIs. He was admitted 05/14/14 with unstable angina. He had a cath followed by stage PCI 05/17/14 two days later secondary to renal insufficiency. He had an RCA DES (Promus P 2.75 mm x 12 mm) placed and an OM1 DES (Promus P 3.0 mm x 8 mm) placed. P2y12 was checked and was low, Plavix was continued. During his hospitalization his Toprol was decreased and he was taken off Lanoxin for bradycardia. He did not have an LV gram. His last EF was 55/60% by echo Dec 2013. He was seen in the office 06/06/14 for follow up. He denied chest pain at that time. He says that he got the Pneumococcal vaccine a few weeks ago and has not felt well since then with cough and chills. He has had some sweats but no documented fevers. He denies coughing up anything. Over the past 3 days he has had intermittent chest tightness over the left chest identical to his typical angina except it has not radiated down the left arm. Tonight it became worse and he presented to the ER with 10/10 pain which resolved after 3 SL NTG and morphine. He has chronic DOE that he stated is stable.   Subjective:  Troponins remain negative & no acute findings on EKG - but recurrent 6-8/10 L sided Anginal pressure radiating to L arm this AM -- down to 2/10 with SL NTG & increased NTG rate.  Objective:  Vital Signs in the last 24 hours: Temp:  [98.2 F (36.8 C)-98.7 F (37.1 C)] 98.7 F (37.1 C) (09/25 0450) Pulse Rate:  [60-78] 60 (09/25 0450) Resp:  [16-26] 18 (09/25 0450) BP: (117-143)/(57-80) 123/61 mmHg (09/25 0450) SpO2:  [96 %-100 %] 98 % (09/25 0450) Weight:  [204 lb 8 oz (92.761 kg)] 204 lb 8 oz (92.761 kg) (09/25 0450)  Intake/Output from previous day:   Intake/Output from this shift:   Physical Exam: General: Well developed, well nourished, In mild-moderate  distress with angina. Head: Eyes PERRLA, No xanthomas. [place]/AT Lungs: CTAB & percussion.  Heart: HRRR S1 S2 Pulses are 2+ & equal.  No carotid bruit. No JVD. No abdominal bruits. No femoral bruits.  Abdomen: Soft, NT/ND/NABS Extremities: No clubbing, cyanosis or edema. DP +1  Neuro: Alert and oriented X 3.  Psych: Good affect, responds appropriately  Lab Results:  Recent Labs  06/27/14 1954 06/28/14 0335  WBC 9.1 8.8  HGB 12.0* 10.8*  PLT 298 233    Recent Labs  06/27/14 1954 06/28/14 0335  NA 141 138  K 4.6 4.1  CL 103 102  CO2 23 20  GLUCOSE 174* 132*  BUN 16 16  CREATININE 1.27 1.22    Recent Labs  06/28/14 0335  TROPONINI <0.30   Hepatic Function Panel  Recent Labs  06/27/14 1954 06/28/14 0335  PROT 7.3 6.3  ALBUMIN 3.2* 2.7*  AST 57* 44*  ALT 89* 72*  ALKPHOS 123* 104  BILITOT 3.0* 2.7*  BILIDIR 2.0*  --   IBILI 1.0*  --    No results found for this basename: CHOL,  in the last 72 hours No results found for this basename: PROTIME,  in the last 72 hours  Imaging: CXR 06/27/14 - No acute disease  Cardiac Studies: Tele - NSR EKG: NSR 67, NSST but no acute findings.  Assessment/Plan:  Principal Problem:   Unstable angina pectoris Active Problems:   CAD S/P CABG '95- several PCis since, last 05/17/14   History of ST elevation myocardial infarction (STEMI) of inferolateral wall (PTCA - 100% large lateral OM)   Coronary stent restenosis with uncertain cause: Required PCI for ISR of OM1 (Promus P 2.75 mm x 12 mm) & pRCA   Type II diabetes mellitus with complication -- CAD, AAA   Hypertension associated with diabetes   Hyperlipidemia LDL goal <70; statin intolerant   Chronic diastolic CHF (congestive heart failure)   Chronic renal insufficiency, stage III (moderate)  Unfortunately, Mr. Husak is presenting now for roughly one month following to slight PCI with the DES stents. His presentation is very similar to the last hospitalization with  symptoms concerning for unstable angina. Advair disc ACI similar presentation, I agree that the best course action is to proceed to cardiac catheterization lab to delineate his anatomy.  I've asked to exudate is returned to the cardiac catheterization. He remains on nitroglycerin drip as well as heparin.  He is on aspirin plus Plavix with a P2Y12 results indicating adequate platelet inhibition  He remains on Toprol 25 mg daily stable heart rate.   Additional blood pressure control amlodipine 5 mg daily for antianginal effect as well as blood pressure control.. No signs of heart failure.  None on statin due to intolerance. He is on fenofibrate.    LOS: 1 day    Maximina Pirozzi W 06/28/2014, 8:56 AM

## 2014-06-28 NOTE — Progress Notes (Signed)
    Cardiac cath results reviewed -- no new findings, stents are both intact He does have a diffusely disease small OM - but ? Not sure why this would all of a sudden lead to resting CP.  D/c'd IV NTG & started Imdur 30mg . -- started having recurrent pain. Given GI cocktail.  Currently CP free.  Will rest for a few hrs & then try to ambulate.  ? If non-cardiac etiology vs. Microvascular disease.  If unable to keep pain free, would not d/c -- reassess in AM.  Leonie Man, M.D., M.S. Interventional Cardiologist   Pager # [phone]

## 2014-06-28 NOTE — Interval H&P Note (Signed)
Cath Lab Visit (complete for each Cath Lab visit)  Clinical Evaluation Leading to the Procedure:   ACS: No.  Non-ACS:    Anginal Classification: CCS III  Anti-ischemic medical therapy: Maximal Therapy (2 or more classes of medications)  Non-Invasive Test Results: No non-invasive testing performed  Prior CABG: Previous CABG      History and Physical Interval Note:  06/28/2014 9:23 AM  [name]  has presented today for surgery, with the diagnosis of unstable angina  The various methods of treatment have been discussed with the patient and family. After consideration of risks, benefits and other options for treatment, the patient has consented to  Procedure(s): LEFT HEART CATHETERIZATION WITH CORONARY/GRAFT ANGIOGRAM (N/A) as a surgical intervention .  The patient's history has been reviewed, patient examined, no change in status, stable for surgery.  I have reviewed the patient's chart and labs.  Questions were answered to the patient's satisfaction.     Resa Rinks,[place]

## 2014-06-28 NOTE — H&P (View-Only) (Signed)
78 y/o followed for years by Dr Rex Kras, now by Dr Ellyn Hack. He has a history history of significant coronary artery disease status post CABG x2 in 1997. He has had multiple PCIs. He was admitted 05/14/14 with unstable angina. He had a cath followed by stage PCI 05/17/14 two days later secondary to renal insufficiency. He had an RCA DES (Promus P 2.75 mm x 12 mm) placed and an OM1 DES (Promus P 3.0 mm x 8 mm) placed. P2y12 was checked and was low, Plavix was continued. During his hospitalization his Toprol was decreased and he was taken off Lanoxin for bradycardia. He did not have an LV gram. His last EF was 55/60% by echo Dec 2013. He was seen in the office 06/06/14 for follow up. He denied chest pain at that time. He says that he got the Pneumococcal vaccine a few weeks ago and has not felt well since then with cough and chills. He has had some sweats but no documented fevers. He denies coughing up anything. Over the past 3 days he has had intermittent chest tightness over the left chest identical to his typical angina except it has not radiated down the left arm. Tonight it became worse and he presented to the ER with 10/10 pain which resolved after 3 SL NTG and morphine. He has chronic DOE that he stated is stable.   Subjective:  Troponins remain negative & no acute findings on EKG - but recurrent 6-8/10 L sided Anginal pressure radiating to L arm this AM -- down to 2/10 with SL NTG & increased NTG rate.  Objective:  Vital Signs in the last 24 hours: Temp:  [98.2 F (36.8 C)-98.7 F (37.1 C)] 98.7 F (37.1 C) (09/25 0450) Pulse Rate:  [60-78] 60 (09/25 0450) Resp:  [16-26] 18 (09/25 0450) BP: (117-143)/(57-80) 123/61 mmHg (09/25 0450) SpO2:  [96 %-100 %] 98 % (09/25 0450) Weight:  [204 lb 8 oz (92.761 kg)] 204 lb 8 oz (92.761 kg) (09/25 0450)  Intake/Output from previous day:   Intake/Output from this shift:   Physical Exam: General: Well developed, well nourished, In mild-moderate  distress with angina. Head: Eyes PERRLA, No xanthomas. [place]/AT Lungs: CTAB & percussion.  Heart: HRRR S1 S2 Pulses are 2+ & equal.  No carotid bruit. No JVD. No abdominal bruits. No femoral bruits.  Abdomen: Soft, NT/ND/NABS Extremities: No clubbing, cyanosis or edema. DP +1  Neuro: Alert and oriented X 3.  Psych: Good affect, responds appropriately  Lab Results:  Recent Labs  06/27/14 1954 06/28/14 0335  WBC 9.1 8.8  HGB 12.0* 10.8*  PLT 298 233    Recent Labs  06/27/14 1954 06/28/14 0335  NA 141 138  K 4.6 4.1  CL 103 102  CO2 23 20  GLUCOSE 174* 132*  BUN 16 16  CREATININE 1.27 1.22    Recent Labs  06/28/14 0335  TROPONINI <0.30   Hepatic Function Panel  Recent Labs  06/27/14 1954 06/28/14 0335  PROT 7.3 6.3  ALBUMIN 3.2* 2.7*  AST 57* 44*  ALT 89* 72*  ALKPHOS 123* 104  BILITOT 3.0* 2.7*  BILIDIR 2.0*  --   IBILI 1.0*  --    No results found for this basename: CHOL,  in the last 72 hours No results found for this basename: PROTIME,  in the last 72 hours  Imaging: CXR 06/27/14 - No acute disease  Cardiac Studies: Tele - NSR EKG: NSR 67, NSST but no acute findings.  Assessment/Plan:  Principal Problem:   Unstable angina pectoris Active Problems:   CAD S/P CABG '95- several PCis since, last 05/17/14   History of ST elevation myocardial infarction (STEMI) of inferolateral wall (PTCA - 100% large lateral OM)   Coronary stent restenosis with uncertain cause: Required PCI for ISR of OM1 (Promus P 2.75 mm x 12 mm) & pRCA   Type II diabetes mellitus with complication -- CAD, AAA   Hypertension associated with diabetes   Hyperlipidemia LDL goal <70; statin intolerant   Chronic diastolic CHF (congestive heart failure)   Chronic renal insufficiency, stage III (moderate)  Unfortunately, Mr. Redinger is presenting now for roughly one month following to slight PCI with the DES stents. His presentation is very similar to the last hospitalization with  symptoms concerning for unstable angina. Advair disc ACI similar presentation, I agree that the best course action is to proceed to cardiac catheterization lab to delineate his anatomy.  I've asked to exudate is returned to the cardiac catheterization. He remains on nitroglycerin drip as well as heparin.  He is on aspirin plus Plavix with a P2Y12 results indicating adequate platelet inhibition  He remains on Toprol 25 mg daily stable heart rate.   Additional blood pressure control amlodipine 5 mg daily for antianginal effect as well as blood pressure control.. No signs of heart failure.  None on statin due to intolerance. He is on fenofibrate.    LOS: 1 day    Chardai Gangemi W 06/28/2014, 8:56 AM

## 2014-06-29 LAB — COMPREHENSIVE METABOLIC PANEL
ALBUMIN: 2.4 g/dL — AB (ref 3.5–5.2)
ALT: 57 U/L — ABNORMAL HIGH (ref 0–53)
ANION GAP: 12 (ref 5–15)
AST: 36 U/L (ref 0–37)
Alkaline Phosphatase: 98 U/L (ref 39–117)
BUN: 17 mg/dL (ref 6–23)
CALCIUM: 8.4 mg/dL (ref 8.4–10.5)
CO2: 21 mEq/L (ref 19–32)
CREATININE: 1.26 mg/dL (ref 0.50–1.35)
Chloride: 104 mEq/L (ref 96–112)
GFR calc Af Amer: 62 mL/min — ABNORMAL LOW (ref >90)
GFR calc non Af Amer: 54 mL/min — ABNORMAL LOW (ref >90)
Glucose, Bld: 168 mg/dL — ABNORMAL HIGH (ref 70–99)
Potassium: 3.9 mEq/L (ref 3.7–5.3)
Sodium: 137 mEq/L (ref 137–147)
Total Bilirubin: 3.2 mg/dL — ABNORMAL HIGH (ref 0.3–1.2)
Total Protein: 5.7 g/dL — ABNORMAL LOW (ref 6.0–8.3)

## 2014-06-29 LAB — CBC
HCT: 28.6 % — ABNORMAL LOW (ref 39.0–52.0)
Hemoglobin: 9.7 g/dL — ABNORMAL LOW (ref 13.0–17.0)
MCH: 31.6 pg (ref 26.0–34.0)
MCHC: 33.9 g/dL (ref 30.0–36.0)
MCV: 93.2 fL (ref 78.0–100.0)
PLATELETS: 186 10*3/uL (ref 150–400)
RBC: 3.07 MIL/uL — AB (ref 4.22–5.81)
RDW: 14.6 % (ref 11.5–15.5)
WBC: 5.8 10*3/uL (ref 4.0–10.5)

## 2014-06-29 LAB — GLUCOSE, CAPILLARY
Glucose-Capillary: 144 mg/dL — ABNORMAL HIGH (ref 70–99)
Glucose-Capillary: 166 mg/dL — ABNORMAL HIGH (ref 70–99)
Glucose-Capillary: 188 mg/dL — ABNORMAL HIGH (ref 70–99)
Glucose-Capillary: 220 mg/dL — ABNORMAL HIGH (ref 70–99)

## 2014-06-29 NOTE — Consult Note (Signed)
Reason for Consult:  Cholelithiasis with elevated Bilirubin Referring Physician: Dr. Tollie Eth  [name] is an 78 y.o. male.  HPI: With a long history of Coronary disease, prior CABG in 2004, multiple caths and stents.  Most recent PCI 05/17/14, with recath 06/28/14, requiring ongoing medical therapy.  He was admitted on 06/28/14 with chest pain and underwent cath showing no significant changes.  Labs did show an elevated bilirubin and going back he had a T bilirubin of 5.5, back on 06/14/14.  ALT 162 and AST of 135. Creatinine ws also up.  Here on admit Bilirubin was down to 2.7.  Pt notes aside from chest pain he has just felt bad since he got Pneumonia vaccine about 2-3 weeks ago.  That is when he started complaining of low grade fevers, and feeling bad.  His chief complaint this admission is chest pain, but if you talk about it long enough he will tell you he has had some intermittent abdominal pain on and off after eating with some nausea and pain in the RUQ.  He underwent US this Am and this shows, "Cholelithiasis without sonographic evidence of acute cholecystitis.  There is dilatation of the common bile duct and intrahepatic bile  ducts without choledocholithiasis. Here in the hospital Bilirubin has gone from 3.0 to 2.7 and now to 3.2 AST, and ALT are improving.  We are ask to see.    Past Medical History  Diagnosis Date  . History of: ST elevation myocardial infarction (STEMI) involving left circumflex coronary artery with complication 4665    PTCA-circumflex; PCI in 1991  . CAD, multiple vessel 1985-2010    Most recent cath 01/01/09: 100% Occluded LAD after SP1, patent LIMA-distal LAD with apical 90% lesion.  Cx-OM1 widely patent stent into proximal bifurcating OM1 . Follow-on Cx => 70-80% -- non-amenable PCI. Widely patent 3 overlapping stents in midRCA with only 40% RPL stenosis; SVG- OM known to be occluded.  . S/P CABG x 2 1997    LIMA-LAD, SVG-OM  . H/O unstable angina 06/2003,  12/2006, 05/2014    a) '04: Staged Taxus DES PCI to RCA and Cx-OM;;'08 - PCI to proximal ISR in RCA - Cypher DES; c) 05/2014: ISR in RCA & OM1, PCI with Promus P DES: RCA  2.75 x 12, OM1 3.0 mm x 8)  . History of nuclear stress test December 2013    LOW at Risk. Moderate region of mid to basal inferolateral scar without ischemia -- consistent with distal circumflex disease. Mild apical hypokinesis with an EF of 47%.  . Irregular heart beat     PVCs and PACs, no arrhythmia recorded  . Exertional dyspnea     Chronic baseline SOB with ambulation  . Sleep apnea     pt. states he was told to return for f/u, to be fitted for CPAp, but pt. reports that he didn't follow up  . AAA (abdominal aortic aneurysm) 08/30/12    Doppler 08/30/12 had a small amount of growth. It was a 4.2 x 4.3 cm greater in size than the previous one noted at 4 x 3.8.  . Diabetes mellitus     Not on oral medication  . Hypertension   . Dyslipidemia, goal LDL below 70   . Osteoarthritis of both knees     And back; multiple back surgeries, right knee arthroplasty and left knee arthroscopic surgery x2  . Chronic back pain      multiple surgeries; C-spine and lumbar  . Erectile  dysfunction   . H/O: pneumonia February 14  . Chronic anemia     On iron supplement; history of positive guaiac - negative colonoscopy in 1996.; Thought to be related to hemorrhoids; status post hemorrhoidectomy  . Ankle edema     Chronic    Past Surgical History  Procedure Laterality Date  . Coronary angioplasty  3875,[id number]    1985 lateral STEMI Circumflex PTCA  . Anterior lat lumbar fusion Left 11/22/2012    Procedure: ANTERIOR LATERAL LUMBAR FUSION 1 LEVEL;  Surgeon: Eustace Moore, MD;  Location: [place] [facility];  Service: Neurosurgery;  Laterality: Left;  Anterior Lateral Lumbar Fusion Lumbar Three-Four  . Lumbar percutaneous pedicle screw 1 level N/A 11/22/2012    Procedure: LUMBAR PERCUTANEOUS PEDICLE SCREW 1 LEVEL;  Surgeon: Eustace Moore, MD;   Location: [place] [facility];  Service: Neurosurgery;  Laterality: N/A;  Lumbar Three-Four Percutaneous Pedicle Screw, Lateral approach  . Back surgery  1979 & x 10    pt. remarks, "I have had about 10 back surgeries"  . Posterior cervical fusion/foraminotomy N/A 05/02/2013    Procedure: POSTERIOR CERVICAL FUSION/FORAMINOTOMY CERVICAL SEVEN THORACIC-ONE;  Surgeon: Eustace Moore, MD;  Location: [place] [facility];  Service: Neurosurgery;  Laterality: N/A;  POSTERIOR CERVICAL FUSION/FORAMINOTOMY CERVICAL SEVEN THORACIC-ONE  . Coronary artery bypass graft  1990    LIMA-LAD, SVG-OM  . Cardiac catheterization  September 2004    None Occluded vein graft to OM; diffuse RCA disease in the mid vessel, 80% circumflex-OM stenosis; follow on AV groove circumflex with sequential 90% stenoses and intervening saccular dilation   . Percutaneous coronary stent intervention (pci-s)  September 2004    PCI - RCA 2 overlapping Taxus DES 2.75 mm x 32 mm and 2.75 mm x 12 mm (3.0 mm); PCI-Cx-OM1 - Taxus DES 3.0 mm x 20 mm (3.1 mm);   Marland Kitchen Percutaneous coronary stent intervention (pci-s)  March 2008    80% ISR in proximal Taxus stent in RCA -- covered proximally with Cypher DES 3.0 mm x 12 mm  . Cardiac catheterization  March 2010    4 abnormal Myoview showing apical thinning (possibly due to apical LAD 95%) : 100% Occluded LAD after SV1, distal LAD grafted via LIMA -apical 95% . Cx -OM1 w/patent stent extending into OM 1 . Follow on Cx - 70-80% - non-amenable PCI. RCA widely patent 3 overlapping stents in mRCA w/less than 40% stenosisin RPL; SVG-OM known occluded   . Cervical discectomy  04/23/10    Decompressive anterior carvical diskectomy. C4-5, C6-7  . Cervical arthrodesis  04/23/10    Anterior cervical arthrodesis, C4-5, C6-7 utilizing 7-mm PEEK interbody cage packed with local autograft & Antifuse putty at C4-5 & an 8-mm cage at C6-7.  Marland Kitchen Anterior cervical plating  04/23/10    At C4-5 and a C6-7 utilizing two separate Biomet MaxAn  plates.  . Colonoscopy  1996  . Minor hemorrhoidectomy    . Total knee arthroplasty Right   . Knee arthroscopy Left     x 2  . Nm myoview ltd  December 2013    LOW RISK. Mmoderate region of mid to basal inferolateral scar without ischemia. Mild apical hypokinesis with an EF of 47%.  . Transthoracic echocardiogram  December 2013    EF 55-60%. moderate LA dilation. Aortic Sclerosis  . Abdominal and lower extremity arterial ultrasound  08/23/2012; 10/10/2013    Normal ABIs. Nonocclusive lower extremity disease. 4.2 cm x 4.3 cm infrarenal AAA;; 4.4 cm x 4.3 cm (  essentially stable)   . Cataract extraction      Family History  Problem Relation Age of Onset  . Arthritis Mother   . Diabetes Father   . Heart disease Father   . Stroke Sister   . Hypertension Sister   . Heart disease Sister   . Diabetes Sister   . Breast cancer Sister   . Heart disease Brother   . Ulcers Brother   . Colon cancer Neg Hx     Social History:  reports that he quit smoking about 30 years ago. His smoking use included Cigarettes. He smoked 0.00 packs per day. He has never used smokeless tobacco. He reports that he drinks about 1.8 ounces of alcohol per week. He reports that he does not use illicit drugs.  Allergies:  Allergies  Allergen Reactions  . Lisinopril Cough    Rxn: unknown  . Statins Other (See Comments)    Muscle ache  . Welchol [Colesevelam Hcl] Itching    Medications:  Prior to Admission:  Prescriptions prior to admission  Medication Sig Dispense Refill  . acetaminophen (TYLENOL) 500 MG tablet Take 1,000 mg by mouth once.      Marland Kitchen aspirin 81 MG tablet Take 81 mg by mouth daily.      . ferrous sulfate 325 (65 FE) MG tablet Take 325 mg by mouth daily with breakfast.      . Multiple Vitamins-Minerals (MULTIVITAMIN WITH MINERALS) tablet Take 1 tablet by mouth daily.       . pioglitazone-metformin (ACTOPLUS MET) 15-500 MG per tablet Take 1 tablet by mouth daily.      Marland Kitchen glucose blood test strip  1 each by Other route 2 (two) times daily. THIS IS FOR THE TRUE TRACK METERUse as instructed  100 each  12   Scheduled: . amLODipine  5 mg Oral Daily  . aspirin EC  81 mg Oral Daily  . clopidogrel  75 mg Oral Daily  . ezetimibe  10 mg Oral Daily  . fenofibrate  160 mg Oral Daily  . ferrous sulfate  325 mg Oral Q breakfast  . isosorbide mononitrate  30 mg Oral Daily  . metoprolol succinate  25 mg Oral Daily  . pantoprazole  40 mg Oral Daily   Continuous:  MHD:[id number], morphine injection, nitroGLYCERIN, ondansetron (ZOFRAN) IV Anti-infectives   None      Recent Labs Lab 06/27/14 1954 06/28/14 0335 06/29/14 0530  AST 57* 44* 36  ALT 89* 72* 57*  ALKPHOS 123* 104 98  BILITOT 3.0* 2.7* 3.2*  PROT 7.3 6.3 5.7*  ALBUMIN 3.2* 2.7* 2.4*   Results for orders placed during the hospital encounter of 06/27/14 (from the past 48 hour(s))  CBC     Status: Abnormal   Collection Time    06/27/14  7:54 PM      Result Value Ref Range   WBC 9.1  4.0 - 10.5 K/uL   RBC 3.90 (*) 4.22 - 5.81 MIL/uL   Hemoglobin 12.0 (*) 13.0 - 17.0 g/dL   HCT 35.7 (*) 39.0 - 52.0 %   MCV 91.5  78.0 - 100.0 fL   MCH 30.8  26.0 - 34.0 pg   MCHC 33.6  30.0 - 36.0 g/dL   RDW 14.8  11.5 - 15.5 %   Platelets 298  150 - 400 K/uL  BASIC METABOLIC PANEL     Status: Abnormal   Collection Time    06/27/14  7:54 PM      Result Value Ref  Range   Sodium 141  137 - 147 mEq/L   Potassium 4.6  3.7 - 5.3 mEq/L   Chloride 103  96 - 112 mEq/L   CO2 23  19 - 32 mEq/L   Glucose, Bld 174 (*) 70 - 99 mg/dL   BUN 16  6 - 23 mg/dL   Creatinine, Ser 1.27  0.50 - 1.35 mg/dL   Calcium 9.5  8.4 - 10.5 mg/dL   GFR calc non Af Amer 53 (*) >90 mL/min   GFR calc Af Amer 62 (*) >90 mL/min   Comment: (NOTE)     The eGFR has been calculated using the CKD EPI equation.     This calculation has not been validated in all clinical situations.     eGFR's persistently <90 mL/min signify possible Chronic Kidney     Disease.    Anion gap 15  5 - 15  PRO B NATRIURETIC PEPTIDE     Status: None   Collection Time    06/27/14  7:54 PM      Result Value Ref Range   Pro B Natriuretic peptide (BNP) 333.0  0 - 450 pg/mL  HEPATIC FUNCTION PANEL     Status: Abnormal   Collection Time    06/27/14  7:54 PM      Result Value Ref Range   Total Protein 7.3  6.0 - 8.3 g/dL   Albumin 3.2 (*) 3.5 - 5.2 g/dL   AST 57 (*) 0 - 37 U/L   ALT 89 (*) 0 - 53 U/L   Alkaline Phosphatase 123 (*) 39 - 117 U/L   Total Bilirubin 3.0 (*) 0.3 - 1.2 mg/dL   Bilirubin, Direct 2.0 (*) 0.0 - 0.3 mg/dL   Indirect Bilirubin 1.0 (*) 0.3 - 0.9 mg/dL  I-STAT TROPOININ, ED     Status: None   Collection Time    06/27/14  8:17 PM      Result Value Ref Range   Troponin i, poc 0.00  0.00 - 0.08 ng/mL   Comment 3            Comment: Due to the release kinetics of cTnI,     a negative result within the first hours     of the onset of symptoms does not rule out     myocardial infarction with certainty.     If myocardial infarction is still suspected,     repeat the test at appropriate intervals.  Randolm Idol, ED     Status: None   Collection Time    06/27/14  8:35 PM      Result Value Ref Range   Troponin i, poc 0.00  0.00 - 0.08 ng/mL   Comment 3            Comment: Due to the release kinetics of cTnI,     a negative result within the first hours     of the onset of symptoms does not rule out     myocardial infarction with certainty.     If myocardial infarction is still suspected,     repeat the test at appropriate intervals.  URINALYSIS, ROUTINE W REFLEX MICROSCOPIC     Status: Abnormal   Collection Time    06/27/14 11:22 PM      Result Value Ref Range   Color, Urine ORANGE (*) YELLOW   Comment: BIOCHEMICALS MAY BE AFFECTED BY COLOR   APPearance CLOUDY (*) CLEAR   Specific Gravity, Urine 1.026  1.005 -  1.030   pH 5.0  5.0 - 8.0   Glucose, UA NEGATIVE  NEGATIVE mg/dL   Hgb urine dipstick NEGATIVE  NEGATIVE   Bilirubin Urine  MODERATE (*) NEGATIVE   Ketones, ur 15 (*) NEGATIVE mg/dL   Protein, ur NEGATIVE  NEGATIVE mg/dL   Urobilinogen, UA 1.0  0.0 - 1.0 mg/dL   Nitrite NEGATIVE  NEGATIVE   Leukocytes, UA SMALL (*) NEGATIVE  URINE MICROSCOPIC-ADD ON     Status: None   Collection Time    06/27/14 11:22 PM      Result Value Ref Range   Squamous Epithelial / LPF RARE  RARE   WBC, UA 3-6  <3 WBC/hpf   RBC / HPF 0-2  <3 RBC/hpf   Bacteria, UA RARE  RARE   Urine-Other MUCOUS PRESENT    GLUCOSE, CAPILLARY     Status: Abnormal   Collection Time    06/28/14  1:37 AM      Result Value Ref Range   Glucose-Capillary 117 (*) 70 - 99 mg/dL   Comment 1 Notify RN    MAGNESIUM     Status: None   Collection Time    06/28/14  3:35 AM      Result Value Ref Range   Magnesium 1.9  1.5 - 2.5 mg/dL  COMPREHENSIVE METABOLIC PANEL     Status: Abnormal   Collection Time    06/28/14  3:35 AM      Result Value Ref Range   Sodium 138  137 - 147 mEq/L   Potassium 4.1  3.7 - 5.3 mEq/L   Chloride 102  96 - 112 mEq/L   CO2 20  19 - 32 mEq/L   Glucose, Bld 132 (*) 70 - 99 mg/dL   BUN 16  6 - 23 mg/dL   Creatinine, Ser 1.22  0.50 - 1.35 mg/dL   Calcium 8.7  8.4 - 10.5 mg/dL   Total Protein 6.3  6.0 - 8.3 g/dL   Albumin 2.7 (*) 3.5 - 5.2 g/dL   AST 44 (*) 0 - 37 U/L   ALT 72 (*) 0 - 53 U/L   Alkaline Phosphatase 104  39 - 117 U/L   Total Bilirubin 2.7 (*) 0.3 - 1.2 mg/dL   GFR calc non Af Amer 56 (*) >90 mL/min   GFR calc Af Amer 65 (*) >90 mL/min   Comment: (NOTE)     The eGFR has been calculated using the CKD EPI equation.     This calculation has not been validated in all clinical situations.     eGFR's persistently <90 mL/min signify possible Chronic Kidney     Disease.   Anion gap 16 (*) 5 - 15  TROPONIN I     Status: None   Collection Time    06/28/14  3:35 AM      Result Value Ref Range   Troponin I <0.30  <0.30 ng/mL   Comment:            Due to the release kinetics of cTnI,     a negative result within the  first hours     of the onset of symptoms does not rule out     myocardial infarction with certainty.     If myocardial infarction is still suspected,     repeat the test at appropriate intervals.  CBC WITH DIFFERENTIAL     Status: Abnormal   Collection Time    06/28/14  3:35 AM  Result Value Ref Range   WBC 8.8  4.0 - 10.5 K/uL   RBC 3.25 (*) 4.22 - 5.81 MIL/uL   Hemoglobin 10.8 (*) 13.0 - 17.0 g/dL   HCT 29.8 (*) 39.0 - 52.0 %   MCV 91.7  78.0 - 100.0 fL   MCH 33.2  26.0 - 34.0 pg   MCHC 36.2 (*) 30.0 - 36.0 g/dL   RDW 14.9  11.5 - 15.5 %   Platelets 233  150 - 400 K/uL   Neutrophils Relative % 74  43 - 77 %   Neutro Abs 6.5  1.7 - 7.7 K/uL   Lymphocytes Relative 15  12 - 46 %   Lymphs Abs 1.3  0.7 - 4.0 K/uL   Monocytes Relative 10  3 - 12 %   Monocytes Absolute 0.9  0.1 - 1.0 K/uL   Eosinophils Relative 0  0 - 5 %   Eosinophils Absolute 0.0  0.0 - 0.7 K/uL   Basophils Relative 1  0 - 1 %   Basophils Absolute 0.1  0.0 - 0.1 K/uL  PROTIME-INR     Status: Abnormal   Collection Time    06/28/14  3:35 AM      Result Value Ref Range   Prothrombin Time 15.5 (*) 11.6 - 15.2 seconds   INR 1.23  0.00 - 1.49  APTT     Status: Abnormal   Collection Time    06/28/14  3:35 AM      Result Value Ref Range   aPTT 84 (*) 24 - 37 seconds   Comment:            IF BASELINE aPTT IS ELEVATED,     SUGGEST PATIENT RISK ASSESSMENT     BE USED TO DETERMINE APPROPRIATE     ANTICOAGULANT THERAPY.  PLATELET INHIBITION P2Y12     Status: Abnormal   Collection Time    06/28/14  3:35 AM      Result Value Ref Range   Platelet Function  P2Y12 145 (*) 194 - 418 PRU   Comment:            The literature has shown a direct     correlation of PRU values over     230 with higher risks of     thrombotic events.  Lower PRU     values are associated with     platelet inhibition.  GLUCOSE, CAPILLARY     Status: Abnormal   Collection Time    06/28/14  8:07 AM      Result Value Ref Range    Glucose-Capillary 130 (*) 70 - 99 mg/dL   Comment 1 Documented in Chart     Comment 2 Notify RN    POCT ACTIVATED CLOTTING TIME     Status: None   Collection Time    06/28/14 10:01 AM      Result Value Ref Range   Activated Clotting Time 129    GLUCOSE, CAPILLARY     Status: Abnormal   Collection Time    06/28/14 10:18 AM      Result Value Ref Range   Glucose-Capillary 125 (*) 70 - 99 mg/dL  GLUCOSE, CAPILLARY     Status: Abnormal   Collection Time    06/28/14 11:25 AM      Result Value Ref Range   Glucose-Capillary 136 (*) 70 - 99 mg/dL   Comment 1 Documented in Chart     Comment 2 Notify RN  CBC     Status: Abnormal   Collection Time    06/28/14 12:25 PM      Result Value Ref Range   WBC 6.4  4.0 - 10.5 K/uL   RBC 3.61 (*) 4.22 - 5.81 MIL/uL   Hemoglobin 11.4 (*) 13.0 - 17.0 g/dL   HCT 32.8 (*) 39.0 - 52.0 %   MCV 90.9  78.0 - 100.0 fL   MCH 31.6  26.0 - 34.0 pg   MCHC 34.8  30.0 - 36.0 g/dL   RDW 14.6  11.5 - 15.5 %   Platelets 235  150 - 400 K/uL  TROPONIN I     Status: None   Collection Time    06/28/14 12:54 PM      Result Value Ref Range   Troponin I <0.30  <0.30 ng/mL   Comment:            Due to the release kinetics of cTnI,     a negative result within the first hours     of the onset of symptoms does not rule out     myocardial infarction with certainty.     If myocardial infarction is still suspected,     repeat the test at appropriate intervals.  GLUCOSE, CAPILLARY     Status: Abnormal   Collection Time    06/28/14  4:24 PM      Result Value Ref Range   Glucose-Capillary 179 (*) 70 - 99 mg/dL   Comment 1 Notify RN     Comment 2 Documented in Chart    TROPONIN I     Status: None   Collection Time    06/28/14  6:51 PM      Result Value Ref Range   Troponin I <0.30  <0.30 ng/mL   Comment:            Due to the release kinetics of cTnI,     a negative result within the first hours     of the onset of symptoms does not rule out     myocardial  infarction with certainty.     If myocardial infarction is still suspected,     repeat the test at appropriate intervals.  GLUCOSE, CAPILLARY     Status: Abnormal   Collection Time    06/28/14  8:56 PM      Result Value Ref Range   Glucose-Capillary 132 (*) 70 - 99 mg/dL   Comment 1 Notify RN    CBC     Status: Abnormal   Collection Time    06/29/14  5:30 AM      Result Value Ref Range   WBC 5.8  4.0 - 10.5 K/uL   RBC 3.07 (*) 4.22 - 5.81 MIL/uL   Hemoglobin 9.7 (*) 13.0 - 17.0 g/dL   HCT 28.6 (*) 39.0 - 52.0 %   MCV 93.2  78.0 - 100.0 fL   MCH 31.6  26.0 - 34.0 pg   MCHC 33.9  30.0 - 36.0 g/dL   RDW 14.6  11.5 - 15.5 %   Platelets 186  150 - 400 K/uL  COMPREHENSIVE METABOLIC PANEL     Status: Abnormal   Collection Time    06/29/14  5:30 AM      Result Value Ref Range   Sodium 137  137 - 147 mEq/L   Potassium 3.9  3.7 - 5.3 mEq/L   Chloride 104  96 - 112 mEq/L   CO2 21  19 -  32 mEq/L   Glucose, Bld 168 (*) 70 - 99 mg/dL   BUN 17  6 - 23 mg/dL   Creatinine, Ser 1.26  0.50 - 1.35 mg/dL   Calcium 8.4  8.4 - 10.5 mg/dL   Total Protein 5.7 (*) 6.0 - 8.3 g/dL   Albumin 2.4 (*) 3.5 - 5.2 g/dL   AST 36  0 - 37 U/L   ALT 57 (*) 0 - 53 U/L   Alkaline Phosphatase 98  39 - 117 U/L   Total Bilirubin 3.2 (*) 0.3 - 1.2 mg/dL   GFR calc non Af Amer 54 (*) >90 mL/min   GFR calc Af Amer 62 (*) >90 mL/min   Comment: (NOTE)     The eGFR has been calculated using the CKD EPI equation.     This calculation has not been validated in all clinical situations.     eGFR's persistently <90 mL/min signify possible Chronic Kidney     Disease.   Anion gap 12  5 - 15  GLUCOSE, CAPILLARY     Status: Abnormal   Collection Time    06/29/14  7:40 AM      Result Value Ref Range   Glucose-Capillary 144 (*) 70 - 99 mg/dL  GLUCOSE, CAPILLARY     Status: Abnormal   Collection Time    06/29/14 11:58 AM      Result Value Ref Range   Glucose-Capillary 166 (*) 70 - 99 mg/dL    Dg Chest 2  View  06/27/2014   CLINICAL DATA:  Chest pain, fever, and cough for 3 days  EXAM: CHEST  2 VIEW  COMPARISON:  May 14, 2014  FINDINGS: The heart size and mediastinal contours are stable. Patient status post prior median sternotomy and CABG. Both lungs are clear. The visualized skeletal structures are stable.  IMPRESSION: No active cardiopulmonary disease.   Electronically Signed   By: Abelardo Diesel M.D.   On: 06/27/2014 21:43   US Abdomen Limited  06/28/2014   CLINICAL DATA:  Chest pain  EXAM: US ABDOMEN LIMITED - RIGHT UPPER QUADRANT  COMPARISON:  None.  FINDINGS: Gallbladder:  There are cholelithiasis without pericholecystic fluid or gallbladder wall thickening. No sonographic Murphy sign noted.  Common bile duct:  Diameter: 9 mm  Liver:  No focal lesion identified. Within normal limits in parenchymal echogenicity. There is mild intrahepatic biliary ductal dilatation.  IMPRESSION: Cholelithiasis without sonographic evidence of acute cholecystitis. There is dilatation of the common bile duct and intrahepatic bile ducts without choledocholithiasis. The common bile duct to the level of the pancreatic head is not well visualized. If there is further clinical concern, further evaluation with MRCP is recommended.   Electronically Signed   By: Kathreen Devoid   On: 06/28/2014 21:24    Review of Systems  Constitutional: Positive for fever (after pneumonia shot), chills and weight loss (5-10 pounds over the last 3 weeks).  HENT: Positive for hearing loss.   Eyes: Negative.        Glasses  Respiratory: Positive for cough (DRY COUGH) and shortness of breath (DOE). Negative for hemoptysis, sputum production and wheezing.   Cardiovascular: Positive for chest pain and leg swelling. Negative for palpitations, orthopnea, claudication and PND.  Gastrointestinal: Positive for heartburn, nausea (Some on occasions after eating) and diarrhea. Negative for vomiting, constipation, blood in stool and melena.  Genitourinary:  Negative.   Musculoskeletal: Positive for back pain (chronic issues, with multiple back surgeries).  Skin:  His wife told him he looked jaundice at one point.   Neurological: Negative.   Endo/Heme/Allergies: Bruises/bleeds easily.  Psychiatric/Behavioral: Negative.    Blood pressure 124/60, pulse 56, temperature 98.4 F (36.9 C), temperature source Oral, resp. rate 16, height _0  (1.753 m), weight 92.565 kg (204 lb 1.1 oz), SpO2 99.00%. Physical Exam  Constitutional: He is oriented to person, place, and time. He appears well-developed and well-nourished. No distress.  HENT:  Head: Normocephalic and atraumatic.  Nose: Nose normal.  Eyes: Conjunctivae and EOM are normal. Pupils are equal, round, and reactive to light. Right eye exhibits no discharge. Left eye exhibits no discharge. No scleral icterus.  Neck: Normal range of motion. Neck supple. No JVD present. No tracheal deviation present. No thyromegaly present.  Cardiovascular: Normal rate, regular rhythm, normal heart sounds and intact distal pulses.  Exam reveals no gallop.   No murmur heard. Respiratory: Effort normal. No respiratory distress. He has no wheezes. He has rales (few rales in the base). He exhibits no tenderness.  Well healed mediasternotomy  GI: Soft. Bowel sounds are normal. He exhibits no distension and no mass. There is tenderness (minimal tenderness RUQ). There is no rebound and no guarding.  Musculoskeletal: He exhibits no edema and no tenderness.  Lymphadenopathy:    He has no cervical adenopathy.  Neurological: He is alert and oriented to person, place, and time. No cranial nerve deficit.  Skin: Skin is warm and dry. No rash noted. He is not diaphoretic. No erythema. No pallor.  Well healed back and RLE surgical sites  Psychiatric: He has a normal mood and affect. His behavior is normal. Judgment and thought content normal.    Assessment/Plan: 1.  Chest pain with significant history of CAD, mild  cardiomyopathy and recent  PCI. 2.  History of tobacco use 1-2 PPD 30 years quit with 1st MI 1985 3.  Hx of  weekend alcohol use in the past  (8-10 beers on the weekend.) 4.  Cholelithiasis with elevated LFT's, possible choledocholithiasis 5.  Hypertension 6.  Diet controlled diabetes 7.  Chronic back pain 8.  AAA 9.  Dyslipidemia  Plan:  With cholelithiasis and elevated LFT's, even though they are improving you need to be concerned about choledocholithiasis.  Dr.Tilley has told us that he  Could not come off anticoagulation now.  He is in fact having some symptoms from his stones, including intermittent post prandial pain RUQ.  Elevated LFT'S,with the T Bilirubin up to 5.5 on 06/14/14.  We will get an MRCP and watch him.  I doubt he can have ERCP while on anticoagulation.  He cannot have surgery per Dr. Wynonia Lawman currently.  Reese Senk 06/29/2014, 1:27 PM

## 2014-06-29 NOTE — Progress Notes (Signed)
Fenofibrate  On hold per L. Ingold NP. Contra indicated for gallblader problems

## 2014-06-29 NOTE — Consult Note (Signed)
Patient examined and I agree with the assessment and plan I also spoke with Dr. Wynonia Lawman. Georganna Skeans, MD, MPH, FACS Trauma: [phone] General Surgery: [phone]  06/29/2014 4:14 PM

## 2014-06-29 NOTE — Progress Notes (Addendum)
Subjective:  Says he had a good night last night. Complains of some very mild left upper chest pain. He tells me the pain feels more like indigestion and does not feel like his previous angina. Catheterization results reviewed. Recent PCI with drug-eluting stent in August. Ultrasound last night showed gallstones.  Objective:  Vital Signs in the last 24 hours: BP 122/56  Pulse 59  Temp(Src) 98.4 F (36.9 C) (Oral)  Resp 18  Ht 5\' 9"  (1.753 m)  Wt 92.565 kg (204 lb 1.1 oz)  BMI 30.12 kg/m2  SpO2 97%  Physical Exam: Pleasant male in no acute distress Lungs:  Clear  Cardiac:  Regular rhythm, normal S1 and S2, no S3 Abdomen:  Soft, no right upper quadrant tenderness Extremities:  Cath site clean and dry  Intake/Output from previous day: 09/25 0701 - 09/26 0700 In: 360 [P.O.:360] Out: 1250 [Urine:1250] Weight Filed Weights   06/28/14 0138 06/28/14 0450 06/29/14 0435  Weight: 92.761 kg (204 lb 8 oz) 92.761 kg (204 lb 8 oz) 92.565 kg (204 lb 1.1 oz)    Lab Results: Basic Metabolic Panel:  Recent Labs  06/28/14 0335 06/29/14 0530  NA 138 137  K 4.1 3.9  CL 102 104  CO2 20 21  GLUCOSE 132* 168*  BUN 16 17  CREATININE 1.22 1.26    CBC:  Recent Labs  06/28/14 0335 06/28/14 1225 06/29/14 0530  WBC 8.8 6.4 5.8  NEUTROABS 6.5  --   --   HGB 10.8* 11.4* 9.7*  HCT 29.8* 32.8* 28.6*  MCV 91.7 90.9 93.2  PLT 233 235 186    BNP    Component Value Date/Time   PROBNP 333.0 06/27/2014 1954    PROTIME: Lab Results  Component Value Date   INR 1.23 06/28/2014   INR 1.06 05/16/2014   INR 1.09 05/15/2014    Telemetry: Sinus rhythm  Assessment/Plan: 1. Chest pain with atypical features with gallstones noted, previous stents placed recently were patent with nothing seen to warrant intervention. 2. Cholelithiasis 3. Diabetes mellitus currently controlled  Recommendations:  I will ask general surgery to see him although with recent placement of drug-eluting stent  would not want to interrupt antiplatelet therapy for surgery until more time has passed unless it is an emergency situation. He is not jaundiced and his liver enzymes are coming down although bili still up some.. Go ahead and watch today since is having some chest pain still.      Kerry Hough  MD [facility] Card[facility]  06/29/2014, 8:28 AM

## 2014-06-30 ENCOUNTER — Inpatient Hospital Stay (HOSPITAL_COMMUNITY): Payer: Medicare Other

## 2014-06-30 LAB — GLUCOSE, CAPILLARY
GLUCOSE-CAPILLARY: 191 mg/dL — AB (ref 70–99)
GLUCOSE-CAPILLARY: 188 mg/dL — AB (ref 70–99)
Glucose-Capillary: 156 mg/dL — ABNORMAL HIGH (ref 70–99)
Glucose-Capillary: 199 mg/dL — ABNORMAL HIGH (ref 70–99)

## 2014-06-30 LAB — CBC
HEMATOCRIT: 29.7 % — AB (ref 39.0–52.0)
Hemoglobin: 10 g/dL — ABNORMAL LOW (ref 13.0–17.0)
MCH: 30.5 pg (ref 26.0–34.0)
MCHC: 33.7 g/dL (ref 30.0–36.0)
MCV: 90.5 fL (ref 78.0–100.0)
Platelets: 219 10*3/uL (ref 150–400)
RBC: 3.28 MIL/uL — AB (ref 4.22–5.81)
RDW: 14.3 % (ref 11.5–15.5)
WBC: 5.4 10*3/uL (ref 4.0–10.5)

## 2014-06-30 MED ORDER — GADOBENATE DIMEGLUMINE 529 MG/ML IV SOLN
20.0000 mL | Freq: Once | INTRAVENOUS | Status: AC | PRN
  Administered 2014-06-30: 20 mL via INTRAVENOUS

## 2014-06-30 NOTE — Progress Notes (Signed)
2 Days Post-Op  Subjective: He is just back from [facility] no pain this AM.  Some chest pain last PM and some post prandial pain yesterday. Objective: Vital signs in last 24 hours: Temp:  [98.2 F (36.8 C)-98.9 F (37.2 C)] 98.2 F (36.8 C) (09/27 0435) Pulse Rate:  [56-86] 73 (09/27 0435) Resp:  [16-18] 18 (09/27 0435) BP: (118-137)/(57-64) 137/61 mmHg (09/27 0435) SpO2:  [98 %-99 %] 98 % (09/27 0435) Weight:  [92.458 kg (203 lb 13.3 oz)] 92.458 kg (203 lb 13.3 oz) (09/27 0435) Last BM Date: 06/29/14 I/O=?   Afebrile VSS Cbc is stable MRI is pending Intake/Output from previous day: 09/26 0701 - 09/27 0700 In: -  Out: 975 [Urine:975] Intake/Output this shift:    General appearance: alert, cooperative and no distress GI: soft, non-tender; bowel sounds normal; no masses,  no organomegaly  Lab Results:   Recent Labs  06/29/14 0530 06/30/14 0457  WBC 5.8 5.4  HGB 9.7* 10.0*  HCT 28.6* 29.7*  PLT 186 219    BMET  Recent Labs  06/28/14 0335 06/29/14 0530  NA 138 137  K 4.1 3.9  CL 102 104  CO2 20 21  GLUCOSE 132* 168*  BUN 16 17  CREATININE 1.22 1.26  CALCIUM 8.7 8.4   PT/INR  Recent Labs  06/28/14 0335  LABPROT 15.5*  INR 1.23     Recent Labs Lab 06/27/14 1954 06/28/14 0335 06/29/14 0530  AST 57* 44* 36  ALT 89* 72* 57*  ALKPHOS 123* 104 98  BILITOT 3.0* 2.7* 3.2*  PROT 7.3 6.3 5.7*  ALBUMIN 3.2* 2.7* 2.4*     Lipase  No results found for this basename: lipase     Studies/Results: US Abdomen Limited  06/28/2014   CLINICAL DATA:  Chest pain  EXAM: US ABDOMEN LIMITED - RIGHT UPPER QUADRANT  COMPARISON:  None.  FINDINGS: Gallbladder:  There are cholelithiasis without pericholecystic fluid or gallbladder wall thickening. No sonographic Murphy sign noted.  Common bile duct:  Diameter: 9 mm  Liver:  No focal lesion identified. Within normal limits in parenchymal echogenicity. There is mild intrahepatic biliary ductal dilatation.  IMPRESSION:  Cholelithiasis without sonographic evidence of acute cholecystitis. There is dilatation of the common bile duct and intrahepatic bile ducts without choledocholithiasis. The common bile duct to the level of the pancreatic head is not well visualized. If there is further clinical concern, further evaluation with MRCP is recommended.   Electronically Signed   By: Kathreen Devoid   On: 06/28/2014 21:24    Medications: . amLODipine  5 mg Oral Daily  . aspirin EC  81 mg Oral Daily  . clopidogrel  75 mg Oral Daily  . ezetimibe  10 mg Oral Daily  . fenofibrate  160 mg Oral Daily  . ferrous sulfate  325 mg Oral Q breakfast  . isosorbide mononitrate  30 mg Oral Daily  . metoprolol succinate  25 mg Oral Daily  . pantoprazole  40 mg Oral Daily    Assessment/Plan 1. Chest pain with significant history of CAD, mild cardiomyopathy and recent PCI.  2. History of tobacco use 1-2 PPD 30 years quit with 1st MI 1985  3. Hx of weekend alcohol use in the past (8-10 beers on the weekend.)  4. Cholelithiasis with elevated LFT's, possible choledocholithiasis  5. Hypertension  6. Diet controlled diabetes  7. Chronic back pain  8. AAA  9. Dyslipidemia   Plan:  Await MRI results.  MRI:  Choledocholithiasis with intra and extrahepatic biliary dilatation. 2. Cholelithiasis without evidence of cholecystitis. 3. No significant pancreatic or hepatic abnormalities  We will discuss with Cardiology and GI.  He has a colonoscopy with Dr. Sharlett Iles  From 1996 scanned into EPIC.    LOS: 3 days    Lisamarie Coke 06/30/2014

## 2014-06-30 NOTE — Progress Notes (Signed)
   Subjective: Mild chest pain that resolved spontaneously last pm - no abd pain currently  Objective: Vital signs in last 24 hours: Temp:  [98.2 F (36.8 C)-98.9 F (37.2 C)] 98.2 F (36.8 C) (09/27 0435) Pulse Rate:  [56-86] 73 (09/27 0435) Resp:  [16-18] 18 (09/27 0435) BP: (118-137)/(57-64) 137/61 mmHg (09/27 0435) SpO2:  [98 %-99 %] 98 % (09/27 0435) Weight:  [203 lb 13.3 oz (92.458 kg)] 203 lb 13.3 oz (92.458 kg) (09/27 0435) Weight change: -3.8 oz (-0.107 kg) Last BM Date: 06/29/14 Intake/Output from previous day: -975 09/26 0701 - 09/27 0700 In: -  Out: 975 [Urine:975] I PE: General:Pleasant affect, NAD Skin:Warm and dry, brisk capillary refill Heart:S1S2 RRR without murmur, gallup, rub or click Lungs:clear without rales, rhonchi, or wheezes HTD:SKAJ, non tender, + BS, do not palpate liver spleen or masses   Lab Results:  Recent Labs  06/29/14 0530 06/30/14 0457  WBC 5.8 5.4  HGB 9.7* 10.0*  HCT 28.6* 29.7*  PLT 186 219   BMET  Recent Labs  06/28/14 0335 06/29/14 0530  NA 138 137  K 4.1 3.9  CL 102 104  CO2 20 21  GLUCOSE 132* 168*  BUN 16 17  CREATININE 1.22 1.26  CALCIUM 8.7 8.4    Recent Labs  06/28/14 1254 06/28/14 1851  TROPONINI <0.30 <0.30    Lab Results  Component Value Date   CHOL 212* 05/15/2014   HDL 36* 05/15/2014   LDLCALC 124* 05/15/2014   TRIG 262* 05/15/2014   CHOLHDL 5.9 05/15/2014   Lab Results  Component Value Date   HGBA1C 7.0% 06/04/2014      Hepatic Function Panel  Recent Labs  06/27/14 1954  06/29/14 0530  PROT 7.3  < > 5.7*  ALBUMIN 3.2*  < > 2.4*  AST 57*  < > 36  ALT 89*  < > 57*  ALKPHOS 123*  < > 98  BILITOT 3.0*  < > 3.2*  BILIDIR 2.0*  --   --   IBILI 1.0*  --   --   < > = values in this interval not displayed.  Medications: I have reviewed the patient's current medications. Scheduled Meds: . amLODipine  5 mg Oral Daily  . aspirin EC  81 mg Oral Daily  . clopidogrel  75 mg Oral Daily  .  ezetimibe  10 mg Oral Daily  . fenofibrate  160 mg Oral Daily  . ferrous sulfate  325 mg Oral Q breakfast  . isosorbide mononitrate  30 mg Oral Daily  . metoprolol succinate  25 mg Oral Daily  . pantoprazole  40 mg Oral Daily   Continuous Infusions:  PRN Meds:.acetaminophen, morphine injection, nitroGLYCERIN, ondansetron (ZOFRAN) IV  Assessment/Plan:  Principal Problem: 1. Chest pain with atypical features with gallstones noted, previous stents placed recently were patent with nothing seen to warrant intervention.  2. Cholelithiasis with elevated liver enzymes that are improving 3. Diabetes mellitus currently controlled   Recommendations:  Appreciate general surgery consultation. He is just back from [facility]. I spoke with radiology earlier and they felt it was okay to go ahead even with recent stent placement. He continues to have atypical chest pain that is different than his previous angina. Because of the recent drug-eluting stent placement about a month ago it will be difficult and the route anticoagulation for surgery at the present time unless it was an extreme emergency.  Kerry Hough MD [facility] Cardiology  06/30/2014

## 2014-06-30 NOTE — Progress Notes (Signed)
I have seen and examined the pt and agree with PA-Jenning's progress note.  

## 2014-07-01 ENCOUNTER — Encounter (HOSPITAL_COMMUNITY): Payer: Self-pay | Admitting: Physician Assistant

## 2014-07-01 ENCOUNTER — Ambulatory Visit: Payer: Self-pay | Admitting: Family Medicine

## 2014-07-01 DIAGNOSIS — Z951 Presence of aortocoronary bypass graft: Secondary | ICD-10-CM

## 2014-07-01 DIAGNOSIS — I714 Abdominal aortic aneurysm, without rupture, unspecified: Secondary | ICD-10-CM

## 2014-07-01 DIAGNOSIS — R0789 Other chest pain: Secondary | ICD-10-CM

## 2014-07-01 DIAGNOSIS — Z9861 Coronary angioplasty status: Secondary | ICD-10-CM

## 2014-07-01 LAB — COMPREHENSIVE METABOLIC PANEL
ALBUMIN: 2.5 g/dL — AB (ref 3.5–5.2)
ALK PHOS: 143 U/L — AB (ref 39–117)
ALT: 63 U/L — ABNORMAL HIGH (ref 0–53)
ANION GAP: 14 (ref 5–15)
AST: 51 U/L — ABNORMAL HIGH (ref 0–37)
BUN: 19 mg/dL (ref 6–23)
CO2: 21 mEq/L (ref 19–32)
Calcium: 9 mg/dL (ref 8.4–10.5)
Chloride: 98 mEq/L (ref 96–112)
Creatinine, Ser: 1.3 mg/dL (ref 0.50–1.35)
GFR calc Af Amer: 60 mL/min — ABNORMAL LOW (ref >90)
GFR calc non Af Amer: 52 mL/min — ABNORMAL LOW (ref >90)
Glucose, Bld: 183 mg/dL — ABNORMAL HIGH (ref 70–99)
POTASSIUM: 4.4 meq/L (ref 3.7–5.3)
Sodium: 133 mEq/L — ABNORMAL LOW (ref 137–147)
TOTAL PROTEIN: 6.4 g/dL (ref 6.0–8.3)
Total Bilirubin: 2.6 mg/dL — ABNORMAL HIGH (ref 0.3–1.2)

## 2014-07-01 LAB — CBC
HEMATOCRIT: 29.5 % — AB (ref 39.0–52.0)
Hemoglobin: 10.2 g/dL — ABNORMAL LOW (ref 13.0–17.0)
MCH: 31 pg (ref 26.0–34.0)
MCHC: 34.6 g/dL (ref 30.0–36.0)
MCV: 89.7 fL (ref 78.0–100.0)
Platelets: 232 10*3/uL (ref 150–400)
RBC: 3.29 MIL/uL — ABNORMAL LOW (ref 4.22–5.81)
RDW: 14 % (ref 11.5–15.5)
WBC: 6.5 10*3/uL (ref 4.0–10.5)

## 2014-07-01 LAB — GLUCOSE, CAPILLARY
GLUCOSE-CAPILLARY: 212 mg/dL — AB (ref 70–99)
Glucose-Capillary: 156 mg/dL — ABNORMAL HIGH (ref 70–99)
Glucose-Capillary: 157 mg/dL — ABNORMAL HIGH (ref 70–99)
Glucose-Capillary: 279 mg/dL — ABNORMAL HIGH (ref 70–99)

## 2014-07-01 MED ORDER — SODIUM CHLORIDE 0.9 % IV SOLN: 3.0000 g | Freq: Once | INTRAVENOUS | Status: DC

## 2014-07-01 MED ORDER — SODIUM CHLORIDE 0.9 % IV SOLN
3.0000 g | Freq: Once | INTRAVENOUS | Status: AC
  Administered 2014-07-03: 3 g via INTRAVENOUS
  Filled 2014-07-01: qty 3

## 2014-07-01 MED ORDER — INDOMETHACIN 50 MG RE SUPP
100.0000 mg | Freq: Once | RECTAL | Status: DC
  Filled 2014-07-01: qty 2

## 2014-07-01 NOTE — Progress Notes (Signed)
Patient seen, examined, and I agree with the above documentation, including the assessment and plan. See previous consult note for further details

## 2014-07-01 NOTE — Consult Note (Signed)
[facility] Consult: 8:33 AM 07/01/2014  LOS: 4 days    Referring Provider: Dr Rosendo Gros.   Primary Care Physician:  [name], MD Primary Gastroenterologist:  Dr. Fuller Plan     Reason for Consultation:  Choledocholithiasis   HPI: [name] is a 78 y.o. male.  PMH CAD, CABG in 2004, MI 1985, multiple caths and stents. Most recent PCI with instent restenosis and DES 05/17/14, recath 06/28/14, requiring ongoing medical therapy.  He is now on Plavix and 81 ASA .  Degenerative disc/spinal disease. Type 2 DM, on oral agents. Hx anemia, on po Iron.  Hx colon polyps and diverticulosis/itis. Last colonoscopy in 2013.   Besides chest pain, pt has had 2 or 3 weeks of post prandial pain of RUQ, not radiating.  No nausea.  Eating well but has lost some weight.  Cardiac enzymes negative.  Work up with ultrasound and CT scan ensued.  LFTs elevated. T bil 3.2, AST/ALT 57/89, alk phos 123.  Ultrasound with cholelithiasis, 9 mm CBD though not well seen at level of pancreatic head.  CT scan with choledocholithiasis, dilated intra/extra hepatic ducts.  Incidental distal abdominal aortic aneurysm.  Some scleral icterus and urine darker than usual at home.  No pruritus.   This morning he ate solid breakfast without problem.  Drinks Vodka cocktails occasionally.    Past Medical History  Diagnosis Date  . History of: ST elevation myocardial infarction (STEMI) involving left circumflex coronary artery with complication 3244    PTCA-circumflex; PCI in 1991  . CAD, multiple vessel 1985-2010    Most recent cath 01/01/09: 100% Occluded LAD after SP1, patent LIMA-distal LAD with apical 90% lesion.  Cx-OM1 widely patent stent into proximal bifurcating OM1 . Follow-on Cx => 70-80% -- non-amenable PCI. Widely patent 3 overlapping  stents in midRCA with only 40% RPL stenosis; SVG- OM known to be occluded.  . S/P CABG x 2 1997    LIMA-LAD, SVG-OM  . H/O unstable angina 06/2003, 12/2006, 05/2014    a) '04: Staged Taxus DES PCI to RCA and Cx-OM;;'08 - PCI to proximal ISR in RCA - Cypher DES; c) 05/2014: ISR in RCA & OM1, PCI with Promus P DES: RCA  2.75 x 12, OM1 3.0 mm x 8)  . History of nuclear stress test December 2013    LOW at Risk. Moderate region of mid to basal inferolateral scar without ischemia -- consistent with distal circumflex disease. Mild apical hypokinesis with an EF of 47%.  . Irregular heart beat     PVCs and PACs, no arrhythmia recorded  . Exertional dyspnea     Chronic baseline SOB with ambulation  . Sleep apnea     pt. states he was told to return for f/u, to be fitted for CPAp, but pt. reports that he didn't follow up  . AAA (abdominal aortic aneurysm) 08/30/12    Doppler 08/30/12 had a small amount of growth. It was a 4.2 x 4.3 cm greater in size than the previous one noted at 4 x 3.8.  . Diabetes mellitus  Not on oral medication  . Hypertension   . Dyslipidemia, goal LDL below 70   . Osteoarthritis of both knees     And back; multiple back surgeries, right knee arthroplasty and left knee arthroscopic surgery x2  . Chronic back pain      multiple surgeries; C-spine and lumbar  . Erectile dysfunction   . H/O: pneumonia February 14  . Chronic anemia     On iron supplement; history of positive guaiac - negative colonoscopy in 1996.; Thought to be related to hemorrhoids; status post hemorrhoidectomy  . Ankle edema     Chronic    Past Surgical History  Procedure Laterality Date  . Coronary angioplasty  3329,5188    1985 lateral STEMI Circumflex PTCA  . Anterior lat lumbar fusion Left 11/22/2012    Procedure: ANTERIOR LATERAL LUMBAR FUSION 1 LEVEL;  Surgeon: Eustace Moore, MD;  Location: [facility];  Service: Neurosurgery;  Laterality: Left;  Anterior Lateral Lumbar Fusion Lumbar Three-Four    . Lumbar percutaneous pedicle screw 1 level N/A 11/22/2012    Procedure: LUMBAR PERCUTANEOUS PEDICLE SCREW 1 LEVEL;  Surgeon: Eustace Moore, MD;  Location: [facility];  Service: Neurosurgery;  Laterality: N/A;  Lumbar Three-Four Percutaneous Pedicle Screw, Lateral approach  . Back surgery  1979 & x 10    pt. remarks, "I have had about 10 back surgeries"  . Posterior cervical fusion/foraminotomy N/A 05/02/2013    Procedure: POSTERIOR CERVICAL FUSION/FORAMINOTOMY CERVICAL SEVEN THORACIC-ONE;  Surgeon: Eustace Moore, MD;  Location: [facility];  Service: Neurosurgery;  Laterality: N/A;  POSTERIOR CERVICAL FUSION/FORAMINOTOMY CERVICAL SEVEN THORACIC-ONE  . Coronary artery bypass graft  1990    LIMA-LAD, SVG-OM  . Cardiac catheterization  September 2004    None Occluded vein graft to OM; diffuse RCA disease in the mid vessel, 80% circumflex-OM stenosis; follow on AV groove circumflex with sequential 90% stenoses and intervening saccular dilation   . Percutaneous coronary stent intervention (pci-s)  September 2004    PCI - RCA 2 overlapping Taxus DES 2.75 mm x 32 mm and 2.75 mm x 12 mm (3.0 mm); PCI-Cx-OM1 - Taxus DES 3.0 mm x 20 mm (3.1 mm);   Marland Kitchen Percutaneous coronary stent intervention (pci-s)  March 2008    80% ISR in proximal Taxus stent in RCA -- covered proximally with Cypher DES 3.0 mm x 12 mm  . Cardiac catheterization  March 2010    4 abnormal Myoview showing apical thinning (possibly due to apical LAD 95%) : 100% Occluded LAD after SV1, distal LAD grafted via LIMA -apical 95% . Cx -OM1 w/patent stent extending into OM 1 . Follow on Cx - 70-80% - non-amenable PCI. RCA widely patent 3 overlapping stents in mRCA w/less than 40% stenosisin RPL; SVG-OM known occluded   . Cervical discectomy  04/23/10    Decompressive anterior carvical diskectomy. C4-5, C6-7  . Cervical arthrodesis  04/23/10    Anterior cervical arthrodesis, C4-5, C6-7 utilizing 7-mm PEEK interbody cage packed with local autograft &  Antifuse putty at C4-5 & an 8-mm cage at C6-7.  Marland Kitchen Anterior cervical plating  04/23/10    At C4-5 and a C6-7 utilizing two separate Biomet MaxAn plates.  . Colonoscopy  1996  . Minor hemorrhoidectomy    . Total knee arthroplasty Right   . Knee arthroscopy Left     x 2  . Nm myoview ltd  December 2013    LOW RISK. Mmoderate region of mid to basal inferolateral scar  without ischemia. Mild apical hypokinesis with an EF of 47%.  . Transthoracic echocardiogram  December 2013    EF 55-60%. moderate LA dilation. Aortic Sclerosis  . Abdominal and lower extremity arterial ultrasound  08/23/2012; 10/10/2013    Normal ABIs. Nonocclusive lower extremity disease. 4.2 cm x 4.3 cm infrarenal AAA;; 4.4 cm x 4.3 cm (essentially stable)   . Cataract extraction      Prior to Admission medications   Medication Sig Start Date End Date Taking? Authorizing Provider  acetaminophen (TYLENOL) 500 MG tablet Take 1,000 mg by mouth once.   Yes Historical Provider, MD  aspirin 81 MG tablet Take 81 mg by mouth daily.   Yes Historical Provider, MD  ferrous sulfate 325 (65 FE) MG tablet Take 325 mg by mouth daily with breakfast.   Yes Historical Provider, MD  Multiple Vitamins-Minerals (MULTIVITAMIN WITH MINERALS) tablet Take 1 tablet by mouth daily.    Yes Historical Provider, MD  pioglitazone-metformin (ACTOPLUS MET) 15-500 MG per tablet Take 1 tablet by mouth daily.   Yes Historical Provider, MD  glucose blood test strip 1 each by Other route 2 (two) times daily. THIS IS FOR THE TRUE TRACK METERUse as instructed 06/25/14   Denita Lung, MD    Scheduled Meds: . amLODipine  5 mg Oral Daily  . aspirin EC  81 mg Oral Daily  . clopidogrel  75 mg Oral Daily  . ezetimibe  10 mg Oral Daily  . fenofibrate  160 mg Oral Daily  . ferrous sulfate  325 mg Oral Q breakfast  . isosorbide mononitrate  30 mg Oral Daily  . metoprolol succinate  25 mg Oral Daily  . pantoprazole  40 mg Oral Daily   Infusions:   PRN  Meds: acetaminophen, morphine injection, nitroGLYCERIN, ondansetron (ZOFRAN) IV   Allergies as of 06/27/2014 - Review Complete 06/27/2014  Allergen Reaction Noted  . Lisinopril Cough 09/22/2012  . Statins Other (See Comments) 09/22/2012  . Welchol [colesevelam hcl] Itching 09/22/2012    Family History  Problem Relation Age of Onset  . Arthritis Mother   . Diabetes Father   . Heart disease Father   . Stroke Sister   . Hypertension Sister   . Heart disease Sister   . Diabetes Sister   . Breast cancer Sister   . Heart disease Brother   . Ulcers Brother   . Colon cancer Neg Hx     History   Social History  . Marital Status: Married    Spouse Name: N/A    Number of Children: N/A  . Years of Education: N/A   Occupational History  . Not on file.   Social History Main Topics  . Smoking status: Former Smoker    Types: Cigarettes    Quit date: 10/05/1983  . Smokeless tobacco: Never Used  . Alcohol Use: 1.8 oz/week    3 Cans of beer per week     Comment: almost every day  . Drug Use: No  . Sexual Activity: Not Currently   Other Topics Concern  . Not on file   Social History Narrative   He is married, father of two, grandfather to 77, great grandfather to two.    Not really getting much exercise now, do to his significant back and hip pain.    He does not smoke and only has an alcoholic beverage.     REVIEW OF SYSTEMS: Constitutional:  Weight loss. No fatigue ENT:  No nose bleeds Pulm:  Stable DOE,  no cough CV:  No palpitations, no LE edema.  GU:  No hematuria, no frequency GI:  Per HPI.  No dysphagia, no pale stools Heme:  No excessive bleeding or bruising   Transfusions:  none Neuro:  No headaches, no peripheral tingling or numbness Derm:  No itching, no rash or sores.  Endocrine:  No sweats or chills.  No polyuria or dysuria Immunization:  Not queried Travel:  None beyond local counties in last few months.    PHYSICAL EXAM: Vital signs in last 24  hours: Filed Vitals:   07/01/14 0507  BP: 131/62  Pulse: 67  Temp: 98.2 F (36.8 C)  Resp: 18   Wt Readings from Last 3 Encounters:  07/01/14 94.121 kg (207 lb 8 oz)  07/01/14 94.121 kg (207 lb 8 oz)  06/19/14 94.802 kg (209 lb)    General: pleasant, well-appearing WM.  comfortable Head:  No swelling or asymmetry  Eyes:  No pallor, mild icterus Ears:  Not HOH  Nose:  No congestion or discharge Mouth:  Oral mucosa moist and clear Neck:  No mass, no JVD.  Radiating heart murmur vs carotid bruit on left Lungs:  Clear bil.  No cough or dyspnea Heart: RRR Abdomen:  Soft, NT, ND.  No mass, no HSM.   Rectal: deferred   Musc/Skeltl: no joint swelling or contracture Extremities:  No CCE  Neurologic:  Oriented x 3.  Fully alert.  No limb weakness Skin:  No obvious jaundice, no sores, no telangectasia Tattoos:  none Nodes:  No cervical adenopathy.    Psych:  Peasant, relaxed, cooperative.  Intake/Output from previous day: 09/27 0701 - 09/28 0700 In: 840 [P.O.:840] Out: -  Intake/Output this shift:    LAB RESULTS:  Recent Labs  06/29/14 0530 06/30/14 0457 07/01/14 0409  WBC 5.8 5.4 6.5  HGB 9.7* 10.0* 10.2*  HCT 28.6* 29.7* 29.5*  PLT 186 219 232   BMET Lab Results  Component Value Date   NA 133* 07/01/2014   NA 137 06/29/2014   NA 138 06/28/2014   K 4.4 07/01/2014   K 3.9 06/29/2014   K 4.1 06/28/2014   CL 98 07/01/2014   CL 104 06/29/2014   CL 102 06/28/2014   CO2 21 07/01/2014   CO2 21 06/29/2014   CO2 20 06/28/2014   GLUCOSE 183* 07/01/2014   GLUCOSE 168* 06/29/2014   GLUCOSE 132* 06/28/2014   BUN 19 07/01/2014   BUN 17 06/29/2014   BUN 16 06/28/2014   CREATININE 1.30 07/01/2014   CREATININE 1.26 06/29/2014   CREATININE 1.22 06/28/2014   CALCIUM 9.0 07/01/2014   CALCIUM 8.4 06/29/2014   CALCIUM 8.7 06/28/2014   LFT  Recent Labs  06/29/14 0530 07/01/14 0409  PROT 5.7* 6.4  ALBUMIN 2.4* 2.5*  AST 36 51*  ALT 57* 63*  ALKPHOS 98 143*  BILITOT 3.2* 2.6*    PT/INR Lab Results  Component Value Date   INR 1.23 06/28/2014   INR 1.06 05/16/2014   INR 1.09 05/15/2014   Hepatitis Panel No results found for this basename: HEPBSAG, HCVAB, HEPAIGM, HEPBIGM,  in the last 72 hours C-Diff No components found with this basename: cdiff   Lipase  No results found for this basename: lipase    Drugs of Abuse  No results found for this basename: labopia, cocainscrnur, labbenz, amphetmu, thcu, labbarb     RADIOLOGY STUDIES: Mr 3d Recon At Scanner Mr Abd W/wo Cm/mrcp 06/30/2014   CLINICAL DATA:  Cholelithiasis with elevated liver  function studies.  EXAM: MRI ABDOMEN WITHOUT AND WITH CONTRAST (INCLUDING MRCP)  TECHNIQUE: Multiplanar multisequence MR imaging of the abdomen was performed both before and after the administration of intravenous contrast. Heavily T2-weighted images of the biliary and pancreatic ducts were obtained, and three-dimensional MRCP images were rendered by post processing.  CONTRAST:  19m MULTIHANCE GADOBENATE DIMEGLUMINE 529 MG/ML IV SOLN  COMPARISON:  Abdominal ultrasound 06/28/2014. Lumbar myelogram CT 08/02/2012.  FINDINGS: The gallbladder is distended and contains small stones. There is no gallbladder wall thickening or surrounding inflammatory change. There is intra and extrahepatic biliary dilatation. The common hepatic duct measures up to 12 mm in diameter. There are small stones within the distal common bile duct, best seen on the coronal images.  The pancreatic duct is normal in caliber. There is no evidence pancreas divisum or surrounding inflammatory change.  There are no suspicious hepatic findings. There is a tiny cyst in the right lobe near the IVC. There are areas of nodular arterial phase enhancement within the liver without abnormality on the portal phase images, consistent with incidental transient hepatic intensity differences.  A 2.8 cm left adrenal nodule is unchanged from the prior myelogram CT, consistent with a benign  finding. This may contain a small amount of central fat and may reflect a myelolipoma. The right adrenal gland and spleen appear normal. Tiny renal cysts are present bilaterally. The right kidney is atrophied with cortical thinning. 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 This is most completely visualized in the coronal plane, measuring up to 4.3 cm AP and 4.2 cm transverse. This extends approximately 4.9 cm cephalocaudad small hiatal hernia and postsurgical changes in the lumbar spine are noted.  IMPRESSION: 1. Choledocholithiasis with intra and extrahepatic biliary dilatation. 2. Cholelithiasis without evidence of cholecystitis. 3. No significant pancreatic or hepatic abnormalities. 4. Stable left adrenal nodule consistent with a benign finding. 5. Fusiform aneurysm of the distal aorta. Recommend follow up by [place]ain 1year. This recommendation follows ACR consensus guidelines: White Paper of the ACR Incidental Findings Committee II on Vascular Findings. JJoellyn [id number]; 96:[id number]   Electronically Signed   By: BCamie PatienceM.D.   On: 06/30/2014 12:14    ENDOSCOPIC STUDIES: 09/2012  Colonoscopy   Dr SFuller PlanFor FOBT + stool Sessile polyps in cecum, sigmoid.  Pedunculated polyp sigmoid.  Diverticulosis.  Int hemorrhoids.   1996 Colonoscopy  Dr PSharlett IlesDiverticulosis    IMPRESSION:   *  Choledocholithiasis.  Cholelithiasis.  Moderate sxs of post prandial pain LUQ  *  Unstable angina. DES placed 05/17/14 and committed to one year of Plavix.   *  CHF, diastolic, chronic. Cardiology rec is to refrain from elective surgery.     PLAN:     *  Per Dr PHilarie Fredrickson    SAzucena Freed 07/01/2014, 8:33 AM Pager: [phone]

## 2014-07-01 NOTE — Care Management Note (Unsigned)
    Page 1 of 2   07/10/2014     3:55:41 PM CARE MANAGEMENT NOTE 07/10/2014  Patient:  [name], [name]   Account Number:  [id number]  Date Initiated:  07/01/2014  Documentation initiated by:  Marvetta Gibbons  Subjective/Objective Assessment:   Pt admitted with [place], s/p cath and gallstones     Action/Plan:   PTA pt lived at home with spouse   Anticipated DC Date:  07/12/2014   Anticipated DC Plan:  [place]  CM consult      Choice offered to / List presented to:             Status of service:  In process, will continue to follow Medicare Important Message given?  YES (If response is "NO", the following Medicare IM given date fields will be blank) Date Medicare IM given:  07/01/2014 Medicare IM given by:  Marvetta Gibbons Date Additional Medicare IM given:  07/08/2014 Additional Medicare IM given by:  Jonnie Finner  Discharge Disposition:    Per UR Regulation:  Reviewed for med. necessity/level of care/duration of stay  If discussed at [place] of Stay Meetings, dates discussed:   07/02/2014  07/09/2014  07/11/2014    Comments:   07-10-14 1548 Jacqlyn Krauss, RN,BSN [phone] Iv antibiotics changed to po with WBC of 15.5. Per MD notes pt may be stable for d/c by 07-11-14. CM will continue to monitor.  07/08/14- 1650- Kristi Webster RN, BSN [phone] Pt with slow progress s/p 2nd ERCP opn 10/2 continues with IV abx, elevated WBC, liq. diet. - PT recommending outpt PT- NCM to follow pt progress   07/08/2014 [place] RN CCM Case Mgmt phone [phone] Medicare Important Message given?  YES (If response is "NO", the following Medicare IM given date fields will be blank) Date Medicare IM given:  07/08/2014 Medicare IM given by:  Jonnie Finner

## 2014-07-01 NOTE — Progress Notes (Signed)
    Subjective:  Sitting up in bed. Comfortable. Had chest discomfort after eating yesterday evening. Also made worse with deep breath. Wants to know what plan is. Discussed with him. Currently appears well. No discomfort.   Objective:  Vital Signs in the last 24 hours: Temp:  [98 F (36.7 C)-98.2 F (36.8 C)] 98.2 F (36.8 C) (09/28 0507) Pulse Rate:  [56-70] 67 (09/28 0507) Resp:  [18-20] 18 (09/28 0507) BP: (116-140)/(60-70) 131/62 mmHg (09/28 0507) SpO2:  [96 %-100 %] 96 % (09/28 0507) Weight:  [207 lb 8 oz (94.121 kg)] 207 lb 8 oz (94.121 kg) (09/28 0507)  Intake/Output from previous day: 09/27 0701 - 09/28 0700 In: 840 [P.O.:840] Out: -    Physical Exam: General: Well developed, well nourished, in no acute distress. Head:  Normocephalic and atraumatic. Lungs: Clear to auscultation and percussion. Heart: Normal S1 and S2.  No murmur, rubs or gallops.  Abdomen: soft, non-tender, positive bowel sounds. Extremities: No clubbing or cyanosis. No edema. Neurologic: Alert and oriented x 3.    Lab Results:  Recent Labs  06/30/14 0457 07/01/14 0409  WBC 5.4 6.5  HGB 10.0* 10.2*  PLT 219 232    Recent Labs  06/29/14 0530 07/01/14 0409  NA 137 133*  K 3.9 4.4  CL 104 98  CO2 21 21  GLUCOSE 168* 183*  BUN 17 19  CREATININE 1.26 1.30    Recent Labs  06/28/14 1254 06/28/14 1851  TROPONINI <0.30 <0.30   Hepatic Function Panel  Recent Labs  07/01/14 0409  PROT 6.4  ALBUMIN 2.5*  AST 51*  ALT 63*  ALKPHOS 143*  BILITOT 2.6*    Telemetry: No adverse rhythms Personally viewed.   EKG:  NSR  Cardiac Studies:  Cath-patent stents. DES 8/15  Assessment/Plan:  Principal Problem:   Unstable angina pectoris Active Problems:   Type II diabetes mellitus with complication -- CAD, AAA   Hypertension associated with diabetes   Hyperlipidemia LDL goal <70; statin intolerant   Chronic diastolic CHF (congestive heart failure)   CAD S/P CABG '95- several  PCis since, last 05/17/14   History of ST elevation myocardial infarction (STEMI) of inferolateral wall (PTCA - 100% large lateral OM)   Chronic renal insufficiency, stage III (moderate)   Coronary stent restenosis with uncertain cause: Required PCI for ISR of OM1 (Promus P 2.75 mm x 12 mm) & pRCA   78 year old with choledocholithiasis, CAD with recent DES placed 05/17/14, prior CABG with postprandial chest pain mostly left sided.   - Await surgery final recs.  - Would refrain from elective surgery as previously discussed secondary to recent DES placement on 05/17/14. - If surgery decides on observation, would DC home with close follow up with them/GI.  -Anemia -stable 10.2 -Hyperlipidemia - On Zetia. Fenofibrate. Mild elevation in LFT's.  -AAA - 4.3 cm.   SKAINS, [place] 07/01/2014, 8:22 AM

## 2014-07-01 NOTE — Progress Notes (Signed)
3 Days Post-Op  Subjective: He feels fine, awaiting plans from GI.    Objective: Vital signs in last 24 hours: Temp:  [98 F (36.7 C)-98.2 F (36.8 C)] 98.2 F (36.8 C) (09/28 0507) Pulse Rate:  [56-70] 67 (09/28 0507) Resp:  [18-20] 18 (09/28 0507) BP: (116-140)/(60-70) 131/62 mmHg (09/28 0507) SpO2:  [96 %-100 %] 96 % (09/28 0507) Weight:  [94.121 kg (207 lb 8 oz)] 94.121 kg (207 lb 8 oz) (09/28 0507) Last BM Date: 06/30/14 840 PO recorded yesterday Afebrile, VSS Bilirubin improving WBC is normal Intake/Output from previous day: 09/27 0701 - 09/28 0700 In: 840 [P.O.:840] Out: -  Intake/Output this shift:    General appearance: alert, cooperative and no distress GI: soft, non-tender; bowel sounds normal; no masses,  no organomegaly  Lab Results:   Recent Labs  06/30/14 0457 07/01/14 0409  WBC 5.4 6.5  HGB 10.0* 10.2*  HCT 29.7* 29.5*  PLT 219 232    BMET  Recent Labs  06/29/14 0530 07/01/14 0409  NA 137 133*  K 3.9 4.4  CL 104 98  CO2 21 21  GLUCOSE 168* 183*  BUN 17 19  CREATININE 1.26 1.30  CALCIUM 8.4 9.0   PT/INR No results found for this basename: LABPROT, INR,  in the last 72 hours   Recent Labs Lab 06/27/14 1954 06/28/14 0335 06/29/14 0530 07/01/14 0409  AST 57* 44* 36 51*  ALT 89* 72* 57* 63*  ALKPHOS 123* 104 98 143*  BILITOT 3.0* 2.7* 3.2* 2.6*  PROT 7.3 6.3 5.7* 6.4  ALBUMIN 3.2* 2.7* 2.4* 2.5*     Lipase  No results found for this basename: lipase     Studies/Results: Mr 3d Recon At Scanner  06/30/2014   CLINICAL DATA:  Cholelithiasis with elevated liver function studies.  EXAM: MRI ABDOMEN WITHOUT AND WITH CONTRAST (INCLUDING MRCP)  TECHNIQUE: Multiplanar multisequence MR imaging of the abdomen was performed both before and after the administration of intravenous contrast. Heavily T2-weighted images of the biliary and pancreatic ducts were obtained, and three-dimensional MRCP images were rendered by post processing.   CONTRAST:  65mL MULTIHANCE GADOBENATE DIMEGLUMINE 529 MG/ML IV SOLN  COMPARISON:  Abdominal ultrasound 06/28/2014. Lumbar myelogram CT 08/02/2012.  FINDINGS: The gallbladder is distended and contains small stones. There is no gallbladder wall thickening or surrounding inflammatory change. There is intra and extrahepatic biliary dilatation. The common hepatic duct measures up to 12 mm in diameter. There are small stones within the distal common bile duct, best seen on the coronal images.  The pancreatic duct is normal in caliber. There is no evidence pancreas divisum or surrounding inflammatory change.  There are no suspicious hepatic findings. There is a tiny cyst in the right lobe near the IVC. There are areas of nodular arterial phase enhancement within the liver without abnormality on the portal phase images, consistent with incidental transient hepatic intensity differences.  A 2.8 cm left adrenal nodule is unchanged from the prior myelogram CT, consistent with a benign finding. This may contain a small amount of central fat and may reflect a myelolipoma. The right adrenal gland and spleen appear normal. Tiny renal cysts are present bilaterally. The right kidney is atrophied with cortical thinning. 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 This is most completely visualized in the coronal plane, measuring up to 4.3 cm AP and 4.2 cm transverse. This extends approximately 4.9 cm cephalocaudad small hiatal hernia and postsurgical changes in the lumbar spine are noted.  IMPRESSION: 1. Choledocholithiasis with intra and extrahepatic biliary dilatation. 2. Cholelithiasis without evidence of cholecystitis. 3. No significant pancreatic or hepatic abnormalities. 4. Stable left adrenal nodule consistent with a benign finding. 5. Fusiform aneurysm of the distal aorta. Recommend follow up by [place] in 1year. This recommendation follows ACR consensus guidelines: White Paper of the ACR Incidental Findings  Committee II on Vascular Findings. Joellyn Rued [id number]; 22:979-892.   Electronically Signed   By: Camie Patience M.D.   On: 06/30/2014 12:14   Mr Abd W/wo Cm/mrcp  06/30/2014   CLINICAL DATA:  Cholelithiasis with elevated liver function studies.  EXAM: MRI ABDOMEN WITHOUT AND WITH CONTRAST (INCLUDING MRCP)  TECHNIQUE: Multiplanar multisequence MR imaging of the abdomen was performed both before and after the administration of intravenous contrast. Heavily T2-weighted images of the biliary and pancreatic ducts were obtained, and three-dimensional MRCP images were rendered by post processing.  CONTRAST:  33mL MULTIHANCE GADOBENATE DIMEGLUMINE 529 MG/ML IV SOLN  COMPARISON:  Abdominal ultrasound 06/28/2014. Lumbar myelogram CT 08/02/2012.  FINDINGS: The gallbladder is distended and contains small stones. There is no gallbladder wall thickening or surrounding inflammatory change. There is intra and extrahepatic biliary dilatation. The common hepatic duct measures up to 12 mm in diameter. There are small stones within the distal common bile duct, best seen on the coronal images.  The pancreatic duct is normal in caliber. There is no evidence pancreas divisum or surrounding inflammatory change.  There are no suspicious hepatic findings. There is a tiny cyst in the right lobe near the IVC. There are areas of nodular arterial phase enhancement within the liver without abnormality on the portal phase images, consistent with incidental transient hepatic intensity differences.  A 2.8 cm left adrenal nodule is unchanged from the prior myelogram CT, consistent with a benign finding. This may contain a small amount of central fat and may reflect a myelolipoma. The right adrenal gland and spleen appear normal. Tiny renal cysts are present bilaterally. The right kidney is atrophied with cortical thinning. 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 This is most completely visualized in the coronal plane, measuring  up to 4.3 cm AP and 4.2 cm transverse. This extends approximately 4.9 cm cephalocaudad small hiatal hernia and postsurgical changes in the lumbar spine are noted.  IMPRESSION: 1. Choledocholithiasis with intra and extrahepatic biliary dilatation. 2. Cholelithiasis without evidence of cholecystitis. 3. No significant pancreatic or hepatic abnormalities. 4. Stable left adrenal nodule consistent with a benign finding. 5. Fusiform aneurysm of the distal aorta. Recommend follow up by [place] in 1year. This recommendation follows ACR consensus guidelines: White Paper of the ACR Incidental Findings Committee II on Vascular Findings. Joellyn Rued [id number]; 44:[id number].   Electronically Signed   By: Camie Patience M.D.   On: 06/30/2014 12:14    Medications: . amLODipine  5 mg Oral Daily  . aspirin EC  81 mg Oral Daily  . clopidogrel  75 mg Oral Daily  . ezetimibe  10 mg Oral Daily  . fenofibrate  160 mg Oral Daily  . ferrous sulfate  325 mg Oral Q breakfast  . isosorbide mononitrate  30 mg Oral Daily  . metoprolol succinate  25 mg Oral Daily  . pantoprazole  40 mg Oral Daily    Assessment/Plan 1. Chest pain with significant history of CAD, mild cardiomyopathy and recent PCI.  2. History of tobacco use 1-2 PPD 30 years quit with 1st MI 1985  3. Hx of weekend alcohol use  in the past (8-10 beers on the weekend.)  4. Cholelithiasis with elevated LFT's, choledocholithiasis. 5. Hypertension  6. Diet controlled diabetes  7. Chronic back pain  8. AAA  9. Dyslipidemia   Plan:  Getting better, no cholecystitis.  He can see [place] after he reaches a point he can have surgery.      LOS: 4 days    [name] 07/01/2014

## 2014-07-01 NOTE — Progress Notes (Signed)
Utilization review completed.  

## 2014-07-01 NOTE — Progress Notes (Signed)
Pt is set up for ERCP with stent placement on Wed 9/30 at 11 AM with Dr Deatra Ina.  This is the soonest the MD and endo schedule could accommodate.  Azucena Freed, PA-C [phone].

## 2014-07-01 NOTE — Consult Note (Signed)
Patient seen, examined, and I agree with the above documentation, including the assessment and plan. Patient with choledocholithiasis and elevated liver enzymes, no evidence for acute pancreatitis or cholangitis Unable to hold Plavix given recent drug-eluting stent to coronary artery Without definitive treatment such as cholecystectomy and ERCP for stone extraction, he remains at risk for cholangitis and pancreatitis With that in mind, I discussed with Dr. Deatra Ina, and we feel ERCP for stent placement is the best option until such time his Plavix can be held for ERCP with sphincterotomy and stone extraction followed by cholecystectomy. The stent should help prevent biliary obstruction, pancreatitis and cholangitis. A covered metal stent will last longer than plastic and can be removed Discussed at length with the patient including the risks and benefits of ERCP. Risks include bleeding, post-ERCP pancreatitis, cholangitis. . Ample time for discussion and questions allowed. The patient understood, was satisfied, and agreed to proceed.  This is planned for Wednesday at 11 AM with monitored anesthesia care

## 2014-07-01 NOTE — Progress Notes (Signed)
Inpatient Diabetes Program Recommendations  AACE/ADA: New Consensus Statement on Inpatient Glycemic Control (2013)  Target Ranges:  Prepandial:   less than 140 mg/dL      Peak postprandial:   less than 180 mg/dL (1-2 hours)      Critically ill patients:  140 - 180 mg/dL   Reason for Visit: Hyperglycemia  Diabetes history: DM2 Outpatient Diabetes medications: Actos Plus Met 15/500 mg QD Current orders for Inpatient glycemic control: None  Results for [name], [name] (MRN [id number]) as of 07/01/2014 16:01  Ref. Range 06/29/2014 20:57 06/30/2014 07:49 06/30/2014 11:38 06/30/2014 16:58 06/30/2014 20:36 07/01/2014 07:44 07/01/2014 11:36  Glucose-Capillary Latest Range: 70-99 mg/dL 220 (H) 156 (H) 191 (H) 188 (H) 199 (H) 156 (H) 157 (H)    Inpatient Diabetes Program Recommendations Correction (SSI): Add Novolog moderate tidwc and hs Diet: Please add CHO mod med to heart healthy diet  Note: Will continue to follow. Thank you. Lorenda Peck, RD, LDN, CDE Inpatient Diabetes Coordinator [phone]

## 2014-07-01 NOTE — Progress Notes (Signed)
D/W GI team ans Dr. Marlou Porch. Patient examined and I agree with the assessment and plan  Georganna Skeans, MD, MPH, FACS Trauma: [phone] General Surgery: [phone]  07/01/2014 10:39 AM

## 2014-07-02 DIAGNOSIS — K8050 Calculus of bile duct without cholangitis or cholecystitis without obstruction: Secondary | ICD-10-CM

## 2014-07-02 DIAGNOSIS — I5032 Chronic diastolic (congestive) heart failure: Secondary | ICD-10-CM

## 2014-07-02 DIAGNOSIS — I509 Heart failure, unspecified: Secondary | ICD-10-CM

## 2014-07-02 LAB — GLUCOSE, CAPILLARY
GLUCOSE-CAPILLARY: 173 mg/dL — AB (ref 70–99)
GLUCOSE-CAPILLARY: 266 mg/dL — AB (ref 70–99)
Glucose-Capillary: 161 mg/dL — ABNORMAL HIGH (ref 70–99)
Glucose-Capillary: 229 mg/dL — ABNORMAL HIGH (ref 70–99)

## 2014-07-02 LAB — CBC
HEMATOCRIT: 29.8 % — AB (ref 39.0–52.0)
Hemoglobin: 10.2 g/dL — ABNORMAL LOW (ref 13.0–17.0)
MCH: 30.8 pg (ref 26.0–34.0)
MCHC: 34.2 g/dL (ref 30.0–36.0)
MCV: 90 fL (ref 78.0–100.0)
Platelets: 222 10*3/uL (ref 150–400)
RBC: 3.31 MIL/uL — ABNORMAL LOW (ref 4.22–5.81)
RDW: 14 % (ref 11.5–15.5)
WBC: 5.4 10*3/uL (ref 4.0–10.5)

## 2014-07-02 LAB — HEPATIC FUNCTION PANEL
ALBUMIN: 2.6 g/dL — AB (ref 3.5–5.2)
ALK PHOS: 173 U/L — AB (ref 39–117)
ALT: 68 U/L — ABNORMAL HIGH (ref 0–53)
AST: 65 U/L — AB (ref 0–37)
BILIRUBIN TOTAL: 3.8 mg/dL — AB (ref 0.3–1.2)
Bilirubin, Direct: 2.7 mg/dL — ABNORMAL HIGH (ref 0.0–0.3)
Indirect Bilirubin: 1.1 mg/dL — ABNORMAL HIGH (ref 0.3–0.9)
Total Protein: 6.5 g/dL (ref 6.0–8.3)

## 2014-07-02 LAB — HEMOGLOBIN A1C
HEMOGLOBIN A1C: 7.4 % — AB (ref <5.7)
Mean Plasma Glucose: 166 mg/dL — ABNORMAL HIGH (ref <117)

## 2014-07-02 MED ORDER — INSULIN ASPART 100 UNIT/ML ~~LOC~~ SOLN
0.0000 [IU] | Freq: Three times a day (TID) | SUBCUTANEOUS | Status: DC
  Administered 2014-07-02: 8 [IU] via SUBCUTANEOUS
  Administered 2014-07-02: 3 [IU] via SUBCUTANEOUS
  Administered 2014-07-03: 5 [IU] via SUBCUTANEOUS
  Administered 2014-07-03 – 2014-07-04 (×2): 3 [IU] via SUBCUTANEOUS
  Administered 2014-07-04: 5 [IU] via SUBCUTANEOUS
  Administered 2014-07-04: 3 [IU] via SUBCUTANEOUS
  Administered 2014-07-05: 2 [IU] via SUBCUTANEOUS
  Administered 2014-07-05 – 2014-07-06 (×3): 3 [IU] via SUBCUTANEOUS
  Administered 2014-07-06 – 2014-07-07 (×2): 2 [IU] via SUBCUTANEOUS
  Administered 2014-07-07 – 2014-07-08 (×4): 3 [IU] via SUBCUTANEOUS
  Administered 2014-07-08: 5 [IU] via SUBCUTANEOUS
  Administered 2014-07-09: 3 [IU] via SUBCUTANEOUS
  Administered 2014-07-09: 8 [IU] via SUBCUTANEOUS
  Administered 2014-07-09: 3 [IU] via SUBCUTANEOUS
  Administered 2014-07-10: 8 [IU] via SUBCUTANEOUS
  Administered 2014-07-10: 3 [IU] via SUBCUTANEOUS
  Administered 2014-07-11: 8 [IU] via SUBCUTANEOUS
  Administered 2014-07-11: 5 [IU] via SUBCUTANEOUS

## 2014-07-02 MED ORDER — INSULIN ASPART 100 UNIT/ML ~~LOC~~ SOLN
0.0000 [IU] | Freq: Every day | SUBCUTANEOUS | Status: DC
  Administered 2014-07-02: 2 [IU] via SUBCUTANEOUS
  Administered 2014-07-03: 4 [IU] via SUBCUTANEOUS

## 2014-07-02 NOTE — Progress Notes (Signed)
       Patient Name: [name] Date of Encounter: 07/02/2014    SUBJECTIVE:Still having post prandial complaints but different than the arm pain prior to stenting the coronaries in August. Still on aspirin and plavix.  TELEMETRY:  NSR Filed Vitals:   07/01/14 0507 07/01/14 1408 07/01/14 2101 07/02/14 0500  BP: 131/62 112/57 133/57 127/63  Pulse: 67 58 80 65  Temp: 98.2 F (36.8 C) 98.4 F (36.9 C) 98.2 F (36.8 C) 98.4 F (36.9 C)  TempSrc: Oral Oral Oral Oral  Resp: 18 22 18 18   Height:      Weight: 207 lb 8 oz (94.121 kg)   207 lb 3.7 oz (94 kg)  SpO2: 96% 95% 100% 98%    Intake/Output Summary (Last 24 hours) at 07/02/14 0919 Last data filed at 07/02/14 0915  Gross per 24 hour  Intake    480 ml  Output      0 ml  Net    480 ml   LABS: Basic Metabolic Panel:  Recent Labs  07/01/14 0409  NA 133*  K 4.4  CL 98  CO2 21  GLUCOSE 183*  BUN 19  CREATININE 1.30  CALCIUM 9.0   CBC:  Recent Labs  07/01/14 0409 07/02/14 0531  WBC 6.5 5.4  HGB 10.2* 10.2*  HCT 29.5* 29.8*  MCV 89.7 90.0  PLT 232 222     Radiology/Studies:  See abdominal ultrasound and MR  Physical Exam: Blood pressure 127/63, pulse 65, temperature 98.4 F (36.9 C), temperature source Oral, resp. rate 18, height 5\' 9"  (1.753 m), weight 207 lb 3.7 oz (94 kg), SpO2 98.00%. Weight change: -4.3 oz (-0.121 kg)  Wt Readings from Last 3 Encounters:  07/02/14 207 lb 3.7 oz (94 kg)  07/02/14 207 lb 3.7 oz (94 kg)  06/19/14 209 lb (94.802 kg)   Chest clear Cardiac exam unremarkable.  ASSESSMENT:  1. Choledocholithiasis with symptoms 2. Recent RCA and OM DES, < 6 weeks.  Plan:  1. Plan to proceed with ERCP. Patient is still on aspirin and Plavix, is this OK? 2. If more definitive therapy needed an alternative approach would be stopping DAPT and covering with IV anti platelet or antithrombotic therapy until procedure and resuming anti platelet when safe (this approach only if there  is urgency to therapy. Otherwise try to wait until 6 months post stent.Marland KitchenMarland KitchenMarch 2016)  Signed, Sinclair Grooms 07/02/2014, 9:19 AM

## 2014-07-02 NOTE — Progress Notes (Signed)
Patient Name: [name] Date of Encounter: 07/04/2014  Principal Problem:   Unstable angina pectoris Active Problems:   Type II diabetes mellitus with complication -- CAD, AAA   Hypertension associated with diabetes   Hyperlipidemia LDL goal <70; statin intolerant   Chronic diastolic CHF (congestive heart failure)   CAD S/P CABG '95- several PCis since, last 05/17/14   History of ST elevation myocardial infarction (STEMI) of inferolateral wall (PTCA - 100% large lateral OM)   Chronic renal insufficiency, stage III (moderate)   Coronary stent restenosis with uncertain cause: Required PCI for ISR of OM1 (Promus P 2.75 mm x 12 mm) & pRCA   Calculus of bile duct without mention of cholecystitis or obstruction    Patient Profile: 78 year old with choledocholithiasis, CAD/CABG, DES placed 05/17/14, admitted 09/24 with postprandial chest pain mostly left sided. Cath 09/24 w/ patent stents, med rx for CFX dz. Had some GI sx, GI seeing, had ERCP w/ stent 09/30, pt developed post-proc pancreatitis  SUBJECTIVE: N&V this am, taking a little clear liquids.  OBJECTIVE Filed Vitals:   07/03/14 1321 07/03/14 1342 07/03/14 1951 07/04/14 0500  BP: 138/66 138/68 120/64 139/68  Pulse: 53 54 68 78  Temp:  98.6 F (37 C) 97.6 F (36.4 C) 98.3 F (36.8 C)  TempSrc:  Oral Oral Oral  Resp: 14 15 18 18   Height:      Weight:      SpO2: 96% 98% 96% 95%    Intake/Output Summary (Last 24 hours) at 07/04/14 1126 Last data filed at 07/03/14 1230  Gross per 24 hour  Intake    400 ml  Output      0 ml  Net    400 ml   Filed Weights   07/01/14 0507 07/02/14 0500 07/03/14 0500  Weight: 207 lb 8 oz (94.121 kg) 207 lb 3.7 oz (94 kg) 200 lb 9.9 oz (91 kg)    PHYSICAL EXAM General: Well developed, well nourished, male in no acute distress. Head: Normocephalic, atraumatic.  Neck: Supple without bruits, JVD 8 cm. Lungs:  Resp regular and unlabored, few rales bases. Heart: RRR, S1, S2, no S3,  S4, 3/6 murmur; no rub. Abdomen: Soft, tender, distended, BS decreased but present.  Extremities: No clubbing, cyanosis, no edema.  Neuro: Alert and oriented X 3. Moves all extremities spontaneously. Psych: Normal affect.  LABS: CBC:  Recent Labs  07/03/14 0516 07/04/14 0556  WBC 4.1 11.4*  HGB 10.2* 11.1*  HCT 29.9* 33.4*  MCV 90.1 93.8  PLT 241 353   Basic Metabolic Panel:  Recent Labs  07/04/14 0556  NA 136*  K 4.7  CL 99  CO2 24  GLUCOSE 229*  BUN 27*  CREATININE 1.45*  CALCIUM 9.5   Liver Function Tests:  Recent Labs  07/02/14 0531 07/04/14 0556  AST 65* 102*  ALT 68* 100*  ALKPHOS 173* 225*  BILITOT 3.8* 4.7*  PROT 6.5 7.2  ALBUMIN 2.6* 2.8*   Amylase  Date Value Ref Range Status  07/04/2014 1847* 0 - 105 U/L Final   Lab Results  Component Value Date   LIPASE >3000* 07/04/2014   TELE:  Not on  Current Medications:  . amLODipine  5 mg Oral Daily  . aspirin EC  81 mg Oral Daily  . clopidogrel  75 mg Oral Daily  . ezetimibe  10 mg Oral Daily  . fenofibrate  160 mg Oral Daily  . indomethacin  100 mg Rectal Once  .  insulin aspart  0-15 Units Subcutaneous TID WC  . insulin aspart  0-5 Units Subcutaneous QHS  . isosorbide mononitrate  30 mg Oral Daily  . metoprolol succinate  25 mg Oral Daily  . pantoprazole  40 mg Oral Daily  . scopolamine       . sodium chloride 1,000 mL (07/04/14 1013)    ASSESSMENT AND PLAN: Principal Problem:   Unstable angina pectoris - med rx for CAD, recent DES, no ongoing anginal symptoms.   Active Problems:   Type II diabetes mellitus with complication -- CAD, AAA - on SSI    Hypertension associated with diabetes - Good control now    Hyperlipidemia LDL goal <70; statin intolerant - continue Zetia, fenofibrate    Chronic diastolic CHF (congestive heart failure) - weight 09/30 down from admit, continue to follow     CAD S/P CABG '95- several PCis since, last 05/17/14 - currently stable    History of ST  elevation myocardial infarction (STEMI) of inferolateral wall (PTCA - 100% large lateral OM) - see above    Chronic renal insufficiency, stage III (moderate) - BUN/Cr higher than admit, continue hydration as volume status will allow. Follow    Coronary stent restenosis with uncertain cause: Required PCI for ISR of OM1 (Promus P 2.75 mm x 12 mm) & pRCA - see above   Jonetta Speak , PA-C 11:26 AM 07/04/2014

## 2014-07-02 NOTE — Progress Notes (Signed)
Patient seen, examined, and I agree with the above documentation, including the assessment and plan. Patient with choledocholithiasis and elevated liver enzymes with mostly cholestatic pattern felt secondary to CBD stone(s) Given his need for antiplatelet therapy given PCI, full ERCP with sphincterotomy and stone extraction cannot be performed on antiplatelet therapy Biliary stenting without sphincterotomy carries significantly lower risk for bleeding even on antiplatelet therapy but should temporize and prevent further complications such as cholangitis or pancreatitis Once his antiplatelet therapy can be stopped he can have biliary stent removed, ERCP with sphincterotomy for stone extraction and cholecystectomy I discussed ERCP at length with the patient including the risks and benefits which include bleeding, infection, post-ERCP pancreatitis, the risk of sedation, and in very rare instances death. After ample time the patient understands and agrees to proceed Procedure will be performed with Dr. Deatra Ina tomorrow around 11 AM

## 2014-07-02 NOTE — Progress Notes (Signed)
Patient examined and I agree with the assessment and plan  Georganna Skeans, MD, MPH, FACS Trauma: [phone] General Surgery: [phone]  07/02/2014 2:08 PM

## 2014-07-02 NOTE — Progress Notes (Signed)
[facility] Progress Note  4 Days Post-Op  Subjective: Pt's pain overall better, but did have some worsening pain last night.  No N/V.  Ambulating well.  No other complaints.  Objective: Vital signs in last 24 hours: Temp:  [98.2 F (36.8 C)-98.4 F (36.9 C)] 98.4 F (36.9 C) (09/29 0500) Pulse Rate:  [58-80] 65 (09/29 0500) Resp:  [18-22] 18 (09/29 0500) BP: (112-133)/(57-63) 127/63 mmHg (09/29 0500) SpO2:  [95 %-100 %] 98 % (09/29 0500) Weight:  [207 lb 3.7 oz (94 kg)] 207 lb 3.7 oz (94 kg) (09/29 0500) Last BM Date: 06/30/14  Intake/Output from previous day: 09/28 0701 - 09/29 0700 In: 240 [P.O.:240] Out: -  Intake/Output this shift:    PE: Gen:  Alert, NAD, pleasant Abd: Soft, minimally tender, ND, +BS, no HSM   Lab Results:   Recent Labs  07/01/14 0409 07/02/14 0531  WBC 6.5 5.4  HGB 10.2* 10.2*  HCT 29.5* 29.8*  PLT 232 222   BMET  Recent Labs  07/01/14 0409  NA 133*  K 4.4  CL 98  CO2 21  GLUCOSE 183*  BUN 19  CREATININE 1.30  CALCIUM 9.0   PT/INR No results found for this basename: LABPROT, INR,  in the last 72 hours CMP     Component Value Date/Time   NA 133* 07/01/2014 0409   K 4.4 07/01/2014 0409   CL 98 07/01/2014 0409   CO2 21 07/01/2014 0409   GLUCOSE 183* 07/01/2014 0409   BUN 19 07/01/2014 0409   CREATININE 1.30 07/01/2014 0409   CREATININE 1.18 06/19/2014 1150   CALCIUM 9.0 07/01/2014 0409   PROT 6.5 07/02/2014 0531   ALBUMIN 2.6* 07/02/2014 0531   AST 65* 07/02/2014 0531   ALT 68* 07/02/2014 0531   ALKPHOS 173* 07/02/2014 0531   BILITOT 3.8* 07/02/2014 0531   GFRNONAA 52* 07/01/2014 0409   GFRAA 60* 07/01/2014 0409   Lipase  No results found for this basename: lipase       Studies/Results: Mr 3d Recon At Scanner  06/30/2014   CLINICAL DATA:  Cholelithiasis with elevated liver function studies.  EXAM: MRI ABDOMEN WITHOUT AND WITH CONTRAST (INCLUDING MRCP)  TECHNIQUE: Multiplanar multisequence MR imaging of the  abdomen was performed both before and after the administration of intravenous contrast. Heavily T2-weighted images of the biliary and pancreatic ducts were obtained, and three-dimensional MRCP images were rendered by post processing.  CONTRAST:  48mL MULTIHANCE GADOBENATE DIMEGLUMINE 529 MG/ML IV SOLN  COMPARISON:  Abdominal ultrasound 06/28/2014. Lumbar myelogram CT 08/02/2012.  FINDINGS: The gallbladder is distended and contains small stones. There is no gallbladder wall thickening or surrounding inflammatory change. There is intra and extrahepatic biliary dilatation. The common hepatic duct measures up to 12 mm in diameter. There are small stones within the distal common bile duct, best seen on the coronal images.  The pancreatic duct is normal in caliber. There is no evidence pancreas divisum or surrounding inflammatory change.  There are no suspicious hepatic findings. There is a tiny cyst in the right lobe near the IVC. There are areas of nodular arterial phase enhancement within the liver without abnormality on the portal phase images, consistent with incidental transient hepatic intensity differences.  A 2.8 cm left adrenal nodule is unchanged from the prior myelogram CT, consistent with a benign finding. This may contain a small amount of central fat and may reflect a myelolipoma. The right adrenal gland and spleen appear normal. Tiny renal cysts are present bilaterally.  The right kidney is atrophied with cortical thinning. 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 This is most completely visualized in the coronal plane, measuring up to 4.3 cm AP and 4.2 cm transverse. This extends approximately 4.9 cm cephalocaudad small hiatal hernia and postsurgical changes in the lumbar spine are noted.  IMPRESSION: 1. Choledocholithiasis with intra and extrahepatic biliary dilatation. 2. Cholelithiasis without evidence of cholecystitis. 3. No significant pancreatic or hepatic abnormalities. 4. Stable  left adrenal nodule consistent with a benign finding. 5. Fusiform aneurysm of the distal aorta. Recommend follow up by [place] in 1year. This recommendation follows ACR consensus guidelines: White Paper of the ACR Incidental Findings Committee II on Vascular Findings. Joellyn Rued [id number]; 32:440-102.   Electronically Signed   By: Camie Patience M.D.   On: 06/30/2014 12:14   Mr Abd W/wo Cm/mrcp  06/30/2014   CLINICAL DATA:  Cholelithiasis with elevated liver function studies.  EXAM: MRI ABDOMEN WITHOUT AND WITH CONTRAST (INCLUDING MRCP)  TECHNIQUE: Multiplanar multisequence MR imaging of the abdomen was performed both before and after the administration of intravenous contrast. Heavily T2-weighted images of the biliary and pancreatic ducts were obtained, and three-dimensional MRCP images were rendered by post processing.  CONTRAST:  44mL MULTIHANCE GADOBENATE DIMEGLUMINE 529 MG/ML IV SOLN  COMPARISON:  Abdominal ultrasound 06/28/2014. Lumbar myelogram CT 08/02/2012.  FINDINGS: The gallbladder is distended and contains small stones. There is no gallbladder wall thickening or surrounding inflammatory change. There is intra and extrahepatic biliary dilatation. The common hepatic duct measures up to 12 mm in diameter. There are small stones within the distal common bile duct, best seen on the coronal images.  The pancreatic duct is normal in caliber. There is no evidence pancreas divisum or surrounding inflammatory change.  There are no suspicious hepatic findings. There is a tiny cyst in the right lobe near the IVC. There are areas of nodular arterial phase enhancement within the liver without abnormality on the portal phase images, consistent with incidental transient hepatic intensity differences.  A 2.8 cm left adrenal nodule is unchanged from the prior myelogram CT, consistent with a benign finding. This may contain a small amount of central fat and may reflect a myelolipoma. The right adrenal gland and spleen appear  normal. Tiny renal cysts are present bilaterally. The right kidney is atrophied with cortical thinning. 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 This is most completely visualized in the coronal plane, measuring up to 4.3 cm AP and 4.2 cm transverse. This extends approximately 4.9 cm cephalocaudad small hiatal hernia and postsurgical changes in the lumbar spine are noted.  IMPRESSION: 1. Choledocholithiasis with intra and extrahepatic biliary dilatation. 2. Cholelithiasis without evidence of cholecystitis. 3. No significant pancreatic or hepatic abnormalities. 4. Stable left adrenal nodule consistent with a benign finding. 5. Fusiform aneurysm of the distal aorta. Recommend follow up by [place] in 1year. This recommendation follows ACR consensus guidelines: White Paper of the ACR Incidental Findings Committee II on Vascular Findings. Joellyn Rued [id number]; 47:[id number].   Electronically Signed   By: Camie Patience M.D.   On: 06/30/2014 12:14    Anti-infectives: Anti-infectives   Start     Dose/Rate Route Frequency Ordered Stop   07/03/14 1100  Ampicillin-Sulbactam (UNASYN) 3 g in sodium chloride 0.9 % 100 mL IVPB     3 g 100 mL/hr over 60 Minutes Intravenous  Once 07/01/14 1545     07/02/14 1600  Ampicillin-Sulbactam (UNASYN) 3 g in sodium  chloride 0.9 % 100 mL IVPB  Status:  Discontinued     3 g 100 mL/hr over 60 Minutes Intravenous  Once 07/01/14 1544 07/01/14 1545       Assessment/Plan 1. Chest pain with significant history of CAD, mild cardiomyopathy and recent PCI.  2. History of tobacco use 1-2 PPD 30 years quit with 1st MI 1985  3. Hx of weekend alcohol use in the past (8-10 beers on the weekend.)  4. Cholelithiasis with elevated LFT's, choledocholithiasis.  5. Hypertension  6. Diet controlled diabetes  7. Chronic back pain  8. AAA  9. Dyslipidemia   Plan:  1.  Getting better, no cholecystitis.  2.  Getting ERCP with stent placement on 07/03/14 with Dr. Deatra Ina.   3.  Will  need lap chole when he can be held from his anticoagulation and cleared for surgery by cards 4.  The patient can follow up with Dr. Grandville Silos after discharge in 2-3 weeks to follow up. 5.  Ambulate and IS    LOS: 5 days    DORT, Dora Clauss 07/02/2014, 7:41 AM Pager: [phone]

## 2014-07-02 NOTE — Progress Notes (Signed)
          Daily Rounding Note  07/02/2014, 9:42 AM  LOS: 5 days   SUBJECTIVE:       Continues with post prandial abdominal discomfort. Some last night, none so far today. Some chest pain.   OBJECTIVE:         Vital signs in last 24 hours:    Temp:  [98.2 F (36.8 C)-98.4 F (36.9 C)] 98.4 F (36.9 C) (09/29 0500) Pulse Rate:  [58-80] 65 (09/29 0500) Resp:  [18-22] 18 (09/29 0500) BP: (112-133)/(57-63) 127/63 mmHg (09/29 0500) SpO2:  [95 %-100 %] 98 % (09/29 0500) Weight:  [94 kg (207 lb 3.7 oz)] 94 kg (207 lb 3.7 oz) (09/29 0500) Last BM Date: 07/01/14 Filed Weights   06/30/14 0435 07/01/14 0507 07/02/14 0500  Weight: 92.458 kg (203 lb 13.3 oz) 94.121 kg (207 lb 8 oz) 94 kg (207 lb 3.7 oz)   General: looks well, comfortable   Heart: RRR.   Chest: clear bil.  Abdomen: soft, NT, ND.  bs active  Extremities: no CCE Neuro/Psych:  Oriented x 3.  No weakness.   Intake/Output from previous day: 09/28 0701 - 09/29 0700 In: 240 [P.O.:240] Out: -   Intake/Output this shift: Total I/O In: 240 [P.O.:240] Out: -   Lab Results:  Recent Labs  06/30/14 0457 07/01/14 0409 07/02/14 0531  WBC 5.4 6.5 5.4  HGB 10.0* 10.2* 10.2*  HCT 29.7* 29.5* 29.8*  PLT 219 232 222   BMET  Recent Labs  07/01/14 0409  NA 133*  K 4.4  CL 98  CO2 21  GLUCOSE 183*  BUN 19  CREATININE 1.30  CALCIUM 9.0   LFT  Recent Labs  07/01/14 0409 07/02/14 0531  PROT 6.4 6.5  ALBUMIN 2.5* 2.6*  AST 51* 65*  ALT 63* 68*  ALKPHOS 143* 173*  BILITOT 2.6* 3.8*  BILIDIR  --  2.7*  IBILI  --  1.1*     ASSESMENT:   * Choledocholithiasis. Cholelithiasis. Moderate sxs of post prandial pain LUQ  * Unstable angina. DES placed 05/17/14 and committed to one year of Plavix.  * CHF, diastolic, chronic. Cardiology rec is to refrain from elective surgery.      PLAN   *  ERCP with placement of biliary stent on 9/30 at 11 AM with Dr  Despina Hick  07/02/2014, 9:42 AM Pager: [phone]

## 2014-07-03 ENCOUNTER — Encounter (HOSPITAL_COMMUNITY)
Admission: EM | Disposition: A | Discharge status: Home / Self Care | Payer: Self-pay | Source: Home / Self Care | Attending: Cardiology

## 2014-07-03 ENCOUNTER — Inpatient Hospital Stay (HOSPITAL_COMMUNITY): Payer: Medicare Other

## 2014-07-03 ENCOUNTER — Inpatient Hospital Stay (HOSPITAL_COMMUNITY): Payer: Medicare Other | Admitting: Anesthesiology

## 2014-07-03 ENCOUNTER — Encounter (HOSPITAL_COMMUNITY): Payer: Self-pay

## 2014-07-03 ENCOUNTER — Encounter (HOSPITAL_COMMUNITY): Payer: Medicare Other | Admitting: Anesthesiology

## 2014-07-03 DIAGNOSIS — K8050 Calculus of bile duct without cholangitis or cholecystitis without obstruction: Secondary | ICD-10-CM

## 2014-07-03 DIAGNOSIS — Z7902 Long term (current) use of antithrombotics/antiplatelets: Secondary | ICD-10-CM

## 2014-07-03 HISTORY — PX: ERCP: SHX5425

## 2014-07-03 HISTORY — PX: BILIARY STENT PLACEMENT: SHX5538

## 2014-07-03 LAB — CBC
HEMATOCRIT: 29.9 % — AB (ref 39.0–52.0)
Hemoglobin: 10.2 g/dL — ABNORMAL LOW (ref 13.0–17.0)
MCH: 30.7 pg (ref 26.0–34.0)
MCHC: 34.1 g/dL (ref 30.0–36.0)
MCV: 90.1 fL (ref 78.0–100.0)
PLATELETS: 241 10*3/uL (ref 150–400)
RBC: 3.32 MIL/uL — ABNORMAL LOW (ref 4.22–5.81)
RDW: 14.1 % (ref 11.5–15.5)
WBC: 4.1 10*3/uL (ref 4.0–10.5)

## 2014-07-03 LAB — GLUCOSE, CAPILLARY
GLUCOSE-CAPILLARY: 327 mg/dL — AB (ref 70–99)
Glucose-Capillary: 198 mg/dL — ABNORMAL HIGH (ref 70–99)
Glucose-Capillary: 244 mg/dL — ABNORMAL HIGH (ref 70–99)

## 2014-07-03 SURGERY — ERCP, WITH INTERVENTION IF INDICATED
Anesthesia: General

## 2014-07-03 MED ORDER — SCOPOLAMINE 1 MG/3DAYS TD PT72
MEDICATED_PATCH | TRANSDERMAL | Status: AC
  Administered 2014-07-03: 1 via TRANSDERMAL
  Filled 2014-07-03: qty 1

## 2014-07-03 MED ORDER — DIPHENHYDRAMINE HCL 50 MG/ML IJ SOLN
INTRAMUSCULAR | Status: DC | PRN
  Administered 2014-07-03: 25 mg via INTRAVENOUS

## 2014-07-03 MED ORDER — SUCCINYLCHOLINE CHLORIDE 20 MG/ML IJ SOLN
INTRAMUSCULAR | Status: DC | PRN
  Administered 2014-07-03: 100 mg via INTRAVENOUS

## 2014-07-03 MED ORDER — DEXAMETHASONE SODIUM PHOSPHATE 4 MG/ML IJ SOLN
INTRAMUSCULAR | Status: DC | PRN
  Administered 2014-07-03: 4 mg via INTRAVENOUS

## 2014-07-03 MED ORDER — LIDOCAINE HCL (CARDIAC) 20 MG/ML IV SOLN
INTRAVENOUS | Status: DC | PRN
  Administered 2014-07-03: 40 mg via INTRAVENOUS
  Administered 2014-07-03: 60 mg via INTRAVENOUS

## 2014-07-03 MED ORDER — ONDANSETRON HCL 4 MG/2ML IJ SOLN
INTRAMUSCULAR | Status: DC | PRN
  Administered 2014-07-03: 4 mg via INTRAVENOUS

## 2014-07-03 MED ORDER — SODIUM CHLORIDE 0.9 % IV SOLN
INTRAVENOUS | Status: DC | PRN
  Administered 2014-07-03: 12:00:00

## 2014-07-03 MED ORDER — LACTATED RINGERS IV SOLN
INTRAVENOUS | Status: DC | PRN
  Administered 2014-07-03: 11:00:00 via INTRAVENOUS

## 2014-07-03 MED ORDER — ONDANSETRON 8 MG/NS 50 ML IVPB
8.0000 mg | Freq: Four times a day (QID) | INTRAVENOUS | Status: DC | PRN
  Administered 2014-07-04 – 2014-07-07 (×3): 8 mg via INTRAVENOUS
  Filled 2014-07-03 (×4): qty 8

## 2014-07-03 MED ORDER — METOCLOPRAMIDE HCL 5 MG/ML IJ SOLN
10.0000 mg | Freq: Once | INTRAMUSCULAR | Status: AC
  Administered 2014-07-03: 10 mg via INTRAVENOUS
  Filled 2014-07-03 (×2): qty 2

## 2014-07-03 MED ORDER — FENTANYL CITRATE 0.05 MG/ML IJ SOLN
INTRAMUSCULAR | Status: DC | PRN
  Administered 2014-07-03: 100 ug via INTRAVENOUS

## 2014-07-03 MED ORDER — PROPOFOL 10 MG/ML IV BOLUS
INTRAVENOUS | Status: DC | PRN
  Administered 2014-07-03: 100 mg via INTRAVENOUS
  Administered 2014-07-03: 50 mg via INTRAVENOUS

## 2014-07-03 MED ORDER — HYDROMORPHONE HCL 1 MG/ML IJ SOLN
1.0000 mg | INTRAMUSCULAR | Status: DC | PRN
  Administered 2014-07-03 – 2014-07-04 (×5): 2 mg via INTRAVENOUS
  Administered 2014-07-05 – 2014-07-06 (×2): 1 mg via INTRAVENOUS
  Administered 2014-07-06: 2 mg via INTRAVENOUS
  Administered 2014-07-07: 1 mg via INTRAVENOUS
  Filled 2014-07-03 (×2): qty 2
  Filled 2014-07-03 (×3): qty 1
  Filled 2014-07-03 (×4): qty 2

## 2014-07-03 MED ORDER — LACTATED RINGERS IV SOLN
INTRAVENOUS | Status: DC
  Administered 2014-07-03: 11:00:00 via INTRAVENOUS

## 2014-07-03 MED ORDER — DIPHENHYDRAMINE HCL 50 MG/ML IJ SOLN
INTRAMUSCULAR | Status: AC
  Filled 2014-07-03: qty 1

## 2014-07-03 MED ORDER — MIDAZOLAM HCL 5 MG/5ML IJ SOLN
INTRAMUSCULAR | Status: DC | PRN
  Administered 2014-07-03: 1 mg via INTRAVENOUS

## 2014-07-03 NOTE — Progress Notes (Signed)
Inpatient Diabetes Program Recommendations  AACE/ADA: New Consensus Statement on Inpatient Glycemic Control (2013)  Target Ranges:  Prepandial:   less than 140 mg/dL      Peak postprandial:   less than 180 mg/dL (1-2 hours)      Critically ill patients:  140 - 180 mg/dL   Reason for Assessment:  Results for [name], [name] (MRN [id number]) as of 07/03/2014 12:44  Ref. Range 07/02/2014 07:51 07/02/2014 11:44 07/02/2014 16:50 07/02/2014 21:11 07/03/2014 07:46  Glucose-Capillary Latest Range: 70-99 mg/dL 173 (H) 266 (H) 161 (H) 229 (H) 198 (H)   Diabetes history: Type 2 diabetes Outpatient Diabetes medications: Actoplus Met 15-500 mg daily Current orders for Inpatient glycemic control:  Novolog moderate tid with meals and HS  Please consider adding basal insulin such as Lantus 15 units daily while patient is in the hospital.  Thanks, Adah Perl, RN, BC-ADM Inpatient Diabetes Coordinator Pager [phone]

## 2014-07-03 NOTE — H&P (View-Only) (Signed)
          Daily Rounding Note  07/02/2014, 9:42 AM  LOS: 5 days   SUBJECTIVE:       Continues with post prandial abdominal discomfort. Some last night, none so far today. Some chest pain.   OBJECTIVE:         Vital signs in last 24 hours:    Temp:  [98.2 F (36.8 C)-98.4 F (36.9 C)] 98.4 F (36.9 C) (09/29 0500) Pulse Rate:  [58-80] 65 (09/29 0500) Resp:  [18-22] 18 (09/29 0500) BP: (112-133)/(57-63) 127/63 mmHg (09/29 0500) SpO2:  [95 %-100 %] 98 % (09/29 0500) Weight:  [94 kg (207 lb 3.7 oz)] 94 kg (207 lb 3.7 oz) (09/29 0500) Last BM Date: 07/01/14 Filed Weights   06/30/14 0435 07/01/14 0507 07/02/14 0500  Weight: 92.458 kg (203 lb 13.3 oz) 94.121 kg (207 lb 8 oz) 94 kg (207 lb 3.7 oz)   General: looks well, comfortable   Heart: RRR.   Chest: clear bil.  Abdomen: soft, NT, ND.  bs active  Extremities: no CCE Neuro/Psych:  Oriented x 3.  No weakness.   Intake/Output from previous day: 09/28 0701 - 09/29 0700 In: 240 [P.O.:240] Out: -   Intake/Output this shift: Total I/O In: 240 [P.O.:240] Out: -   Lab Results:  Recent Labs  06/30/14 0457 07/01/14 0409 07/02/14 0531  WBC 5.4 6.5 5.4  HGB 10.0* 10.2* 10.2*  HCT 29.7* 29.5* 29.8*  PLT 219 232 222   BMET  Recent Labs  07/01/14 0409  NA 133*  K 4.4  CL 98  CO2 21  GLUCOSE 183*  BUN 19  CREATININE 1.30  CALCIUM 9.0   LFT  Recent Labs  07/01/14 0409 07/02/14 0531  PROT 6.4 6.5  ALBUMIN 2.5* 2.6*  AST 51* 65*  ALT 63* 68*  ALKPHOS 143* 173*  BILITOT 2.6* 3.8*  BILIDIR  --  2.7*  IBILI  --  1.1*     ASSESMENT:   * Choledocholithiasis. Cholelithiasis. Moderate sxs of post prandial pain LUQ  * Unstable angina. DES placed 05/17/14 and committed to one year of Plavix.  * CHF, diastolic, chronic. Cardiology rec is to refrain from elective surgery.      PLAN   *  ERCP with placement of biliary stent on 9/30 at 11 AM with Dr  Despina Hick  07/02/2014, 9:42 AM Pager: [phone]

## 2014-07-03 NOTE — Progress Notes (Signed)
MD notified that pt is still c/o of 10 out of 10 abdl pain radiating to his chest. Pt's abd is tender & hard. Pt states he feels gaseous & that if he burps he'll feel better but the pt has been unable to do so. VS stable & charted. Pt given 4 mg of IV zofran along with ginger ale for nausea. Pt's abd girth was measured. New orders received. Will continue to monitor the pt. Hoover Brunette, RN

## 2014-07-03 NOTE — Interval H&P Note (Signed)
History and Physical Interval Note:  07/03/2014 11:16 AM  [name]  has presented today for surgery, with the diagnosis of ERCP WITH STENT PLACEMENT  The various methods of treatment have been discussed with the patient and family. After consideration of risks, benefits and other options for treatment, the patient has consented to  Procedure(s): ENDOSCOPIC RETROGRADE CHOLANGIOPANCREATOGRAPHY (ERCP) (N/A) BILIARY STENT PLACEMENT (N/A) as a surgical intervention .  The patient's history has been reviewed, patient examined, no change in status, stable for surgery.  I have reviewed the patient's chart and labs.  Questions were answered to the patient's satisfaction.    The recent H&P (dated *07/02/14**) was reviewed, the patient was examined and there is no change in the patients condition since that H&P was completed.   Erskine Emery  07/03/2014, 11:16 AM     Erskine Emery

## 2014-07-03 NOTE — Anesthesia Preprocedure Evaluation (Signed)
Anesthesia Evaluation  Patient identified by MRN, date of birth, ID band Patient awake    Reviewed: Allergy & Precautions, H&P , NPO status , Patient's Chart, lab work & pertinent test results  History of Anesthesia Complications Negative for: history of anesthetic complications  Airway Mallampati: II TM Distance: >3 FB Neck ROM: Full    Dental  (+) Missing, Dental Advisory Given   Pulmonary shortness of breath, former smoker (quit 1985),  breath sounds clear to auscultation        Cardiovascular hypertension, - angina+ CAD, + Past MI, + Cardiac Stents ('04, '08, 8/15), + CABG (1997) and + Peripheral Vascular Disease (AAA) Rhythm:Regular Rate:Normal     Neuro/Psych Chronic back pain    GI/Hepatic GERD-  Medicated and Controlled,Stones, elevated LFTs   Endo/Other  diabetes (glu 198), Oral Hypoglycemic Agents  Renal/GU Renal InsufficiencyRenal disease (creat 1.30)     Musculoskeletal  (+) Arthritis -,   Abdominal   Peds  Hematology  (+) Blood dyscrasia (Hb 10.2), ,   Anesthesia Other Findings   Reproductive/Obstetrics                           Anesthesia Physical Anesthesia Plan  ASA: III  Anesthesia Plan: General   Post-op Pain Management:    Induction: Intravenous  Airway Management Planned: Oral ETT  Additional Equipment:   Intra-op Plan:   Post-operative Plan: Extubation in OR  Informed Consent: I have reviewed the patients History and Physical, chart, labs and discussed the procedure including the risks, benefits and alternatives for the proposed anesthesia with the patient or authorized representative who has indicated his/her understanding and acceptance.   Dental advisory given  Plan Discussed with: CRNA and Surgeon  Anesthesia Plan Comments: (Plan routine monitors, A line, GETA)        Anesthesia Quick Evaluation

## 2014-07-03 NOTE — Op Note (Signed)
[facility] [place], [id number]   ERCP PROCEDURE REPORT        EXAM DATE: 07/03/2014  PATIENT NAME:          [name], [name]          MR #: [id number] BIRTHDATE:       Jul 03, 1936     VISIT #:     [phone] ATTENDING:     Inda Castle, MD     STATUS:     inpatient ASSISTANT:      Clemmie Krill and [place], [place]  INDICATIONS:  The patient is a 78 yr old male here for an ERCP due to suspected or rule out bile duct stones. PROCEDURE PERFORMED:     ERCP with stent placement MEDICATIONS:     Per Anesthesia  CONSENT: The patient understands the risks and benefits of the procedure and understands that these risks include, but are not limited to: sedation, allergic reaction, infection, perforation and/or bleeding. Alternative means of evaluation and treatment include, among others: physical exam, x-rays, and/or surgical intervention. The patient elects to proceed with this endoscopic procedure.  DESCRIPTION OF PROCEDURE: During intra-op preparation period all mechanical & medical equipment was checked for proper function. Hand hygiene and appropriate measures for infection prevention was taken. After the risks, benefits and alternatives of the procedure were thoroughly explained, Informed was verified, confirmed and timeout was successfully executed by the treatment team. With the patient in left semi-prone position, medications were administered intravenously.The Pentax Ercp Scope [id number] was passed from the mouth into the esophagus and further advanced from the esophagus into the stomach. From stomach scope was directed to the third portion of the duodenum.  Major papilla was aligned with the duodenoscope. The scope position was confirmed fluoroscopically. Rest of the findings/therapeutics are given below. The scope was then completely withdrawn from the patient and the procedure completed. The pulse, BP, and O2 saturation were  monitored and documented by the physician and the nursing staff throughout the entire procedure. The patient was cared for as planned according to standard protocol. The patient was then discharged to recovery in stable condition and with appropriate post procedure care.  The common bile duct was cannulated with a 0.35 mm guidewire. Injection demonstrated diffuse dilatation of the common bile duct with at least 2 filling defects measuring 10-12 mm. A #10 French 6 mm covered metallic biliary stent was placed across the papilla.  Excellent drainage of inspissated bile was observed.     ADVERSE EVENT:     There were no complications. IMPRESSIONS:     Th    choledocholithiasis-status post placement of covered metallic biliary stent  RECOMMENDATIONS:     1.  resume Plavix 2.  elective ERCP and stone extraction/cholecystectomy once Plavix is discontinued REPEAT EXAM:   ___________________________________ Inda Castle, MD eSigned:  Inda Castle, MD 07/03/2014 12:14 PM   cc: Jill Alexanders, MD  CPT CODES: ICD9 CODES:  The ICD and CPT codes recommended by this software are interpretations from the data that the clinical staff has captured with the software.  The verification of the translation of this report to the ICD and CPT codes and modifiers is the sole responsibility of the health care institution and practicing physician where this report was generated.  [employer] will not be held responsible for the validity of the ICD and CPT codes included on this report.  AMA assumes no liability for data contained  or not contained herein. CPT is a Designer, television/film set of the [employer].   PATIENT NAME:  [name], [name] MR#: [id number]

## 2014-07-03 NOTE — Progress Notes (Signed)
Multiple large bile duct stones were seen at ERCP.  Covered metal biliary stent was placed.  Recommendations #1 resume Plavix and continue antibiotics #2 advance diet as tolerated

## 2014-07-03 NOTE — Telephone Encounter (Signed)
fyi

## 2014-07-03 NOTE — Transfer of Care (Signed)
Immediate Anesthesia Transfer of Care Note  Patient: [name]  Procedure(s) Performed: Procedure(s): ENDOSCOPIC RETROGRADE CHOLANGIOPANCREATOGRAPHY (ERCP) (N/A) BILIARY STENT PLACEMENT (N/A)  Patient Location: Endoscopy Unit  Anesthesia Type:General  Level of Consciousness: awake, alert  and oriented  Airway & Oxygen Therapy: Patient Spontanous Breathing and Patient connected to nasal cannula oxygen  Post-op Assessment: Report given to PACU RN and Post -op Vital signs reviewed and stable  Post vital signs: Reviewed and stable  Complications: No apparent anesthesia complications

## 2014-07-03 NOTE — Anesthesia Procedure Notes (Signed)
Procedure Name: Intubation Date/Time: 07/03/2014 11:30 AM Performed by: [place] Pink Pre-anesthesia Checklist: Patient identified, Emergency Drugs available, Suction available, Patient being monitored and Timeout performed Patient Re-evaluated:Patient Re-evaluated prior to inductionOxygen Delivery Method: Circle system utilized Preoxygenation: Pre-oxygenation with 100% oxygen Intubation Type: IV induction Ventilation: Mask ventilation without difficulty Laryngoscope Size: Mac and 4 Grade View: Grade I Tube type: Oral Tube size: 7.5 mm Number of attempts: 1 Airway Equipment and Method: Stylet and LTA kit utilized Placement Confirmation: ETT inserted through vocal cords under direct vision,  positive ETCO2 and breath sounds checked- equal and bilateral Secured at: 21 cm Tube secured with: Tape Dental Injury: Teeth and Oropharynx as per pre-operative assessment

## 2014-07-03 NOTE — H&P (View-Only) (Signed)
Patient seen, examined, and I agree with the above documentation, including the assessment and plan. Patient with choledocholithiasis and elevated liver enzymes with mostly cholestatic pattern felt secondary to CBD stone(s) Given his need for antiplatelet therapy given PCI, full ERCP with sphincterotomy and stone extraction cannot be performed on antiplatelet therapy Biliary stenting without sphincterotomy carries significantly lower risk for bleeding even on antiplatelet therapy but should temporize and prevent further complications such as cholangitis or pancreatitis Once his antiplatelet therapy can be stopped he can have biliary stent removed, ERCP with sphincterotomy for stone extraction and cholecystectomy I discussed ERCP at length with the patient including the risks and benefits which include bleeding, infection, post-ERCP pancreatitis, the risk of sedation, and in very rare instances death. After ample time the patient understands and agrees to proceed Procedure will be performed with Dr. Deatra Ina tomorrow around 11 AM

## 2014-07-03 NOTE — Progress Notes (Signed)
The patient is stabled this morning from CV viewpoint. Appreciate Dr. Vena Rua note and outline for care. We will follow and discharge when that is possible.

## 2014-07-03 NOTE — Progress Notes (Signed)
Pt c/o increased Abdominal pain that radiates up to LT chest.  Pain began after eating lunch.  2mg  morphine IV given at 16:25.  Pt stated his pain is the same after pain med.  He says this is the same pain that he has been having, but Morphine has helped it in the past.  Denies Nausea.   Dr. Carlean Purl (GI) notified.  New orders received to start clear liquids and give another dose of Morphine.  Will continue to monitor pt.  Oncoming night RN updated also.

## 2014-07-03 NOTE — Anesthesia Postprocedure Evaluation (Signed)
  Anesthesia Post-op Note  Patient: [name]  Procedure(s) Performed: Procedure(s): ENDOSCOPIC RETROGRADE CHOLANGIOPANCREATOGRAPHY (ERCP) (N/A) BILIARY STENT PLACEMENT (N/A)  Patient Location: Endoscopy Unit  Anesthesia Type:General  Level of Consciousness: awake, alert , oriented and patient cooperative  Airway and Oxygen Therapy: Patient Spontanous Breathing  Post-op Pain: none  Post-op Assessment: Post-op Vital signs reviewed, Patient's Cardiovascular Status Stable, Respiratory Function Stable, Patent Airway, No signs of Nausea or vomiting and Pain level controlled  Post-op Vital Signs: Reviewed and stable  Last Vitals:  Filed Vitals:   07/03/14 1230  BP: 155/80  Pulse: 57  Temp:   Resp: 19    Complications: No apparent anesthesia complications

## 2014-07-04 ENCOUNTER — Encounter (HOSPITAL_COMMUNITY): Payer: Self-pay | Admitting: Gastroenterology

## 2014-07-04 ENCOUNTER — Inpatient Hospital Stay (HOSPITAL_COMMUNITY): Payer: Medicare Other

## 2014-07-04 DIAGNOSIS — K858 Other acute pancreatitis: Secondary | ICD-10-CM

## 2014-07-04 DIAGNOSIS — K8050 Calculus of bile duct without cholangitis or cholecystitis without obstruction: Secondary | ICD-10-CM | POA: Diagnosis present

## 2014-07-04 DIAGNOSIS — I5032 Chronic diastolic (congestive) heart failure: Secondary | ICD-10-CM

## 2014-07-04 DIAGNOSIS — K859 Acute pancreatitis without necrosis or infection, unspecified: Secondary | ICD-10-CM | POA: Diagnosis not present

## 2014-07-04 LAB — COMPREHENSIVE METABOLIC PANEL
ALT: 100 U/L — AB (ref 0–53)
AST: 102 U/L — ABNORMAL HIGH (ref 0–37)
Albumin: 2.8 g/dL — ABNORMAL LOW (ref 3.5–5.2)
Alkaline Phosphatase: 225 U/L — ABNORMAL HIGH (ref 39–117)
Anion gap: 13 (ref 5–15)
BUN: 27 mg/dL — ABNORMAL HIGH (ref 6–23)
CALCIUM: 9.5 mg/dL (ref 8.4–10.5)
CO2: 24 mEq/L (ref 19–32)
Chloride: 99 mEq/L (ref 96–112)
Creatinine, Ser: 1.45 mg/dL — ABNORMAL HIGH (ref 0.50–1.35)
GFR calc Af Amer: 52 mL/min — ABNORMAL LOW (ref >90)
GFR calc non Af Amer: 45 mL/min — ABNORMAL LOW (ref >90)
Glucose, Bld: 229 mg/dL — ABNORMAL HIGH (ref 70–99)
POTASSIUM: 4.7 meq/L (ref 3.7–5.3)
Sodium: 136 mEq/L — ABNORMAL LOW (ref 137–147)
TOTAL PROTEIN: 7.2 g/dL (ref 6.0–8.3)
Total Bilirubin: 4.7 mg/dL — ABNORMAL HIGH (ref 0.3–1.2)

## 2014-07-04 LAB — GLUCOSE, CAPILLARY
Glucose-Capillary: 219 mg/dL — ABNORMAL HIGH (ref 70–99)
Glucose-Capillary: 168 mg/dL — ABNORMAL HIGH (ref 70–99)
Glucose-Capillary: 196 mg/dL — ABNORMAL HIGH (ref 70–99)
Glucose-Capillary: 177 mg/dL — ABNORMAL HIGH (ref 70–99)

## 2014-07-04 LAB — CBC
HCT: 33.4 % — ABNORMAL LOW (ref 39.0–52.0)
Hemoglobin: 11.1 g/dL — ABNORMAL LOW (ref 13.0–17.0)
MCH: 31.2 pg (ref 26.0–34.0)
MCHC: 33.2 g/dL (ref 30.0–36.0)
MCV: 93.8 fL (ref 78.0–100.0)
Platelets: 280 10*3/uL (ref 150–400)
RBC: 3.56 MIL/uL — ABNORMAL LOW (ref 4.22–5.81)
RDW: 14.3 % (ref 11.5–15.5)
WBC: 11.4 10*3/uL — AB (ref 4.0–10.5)

## 2014-07-04 LAB — LIPASE, BLOOD: Lipase: >3000 U/L — ABNORMAL HIGH (ref 11–59)

## 2014-07-04 LAB — AMYLASE: Amylase: 1847 U/L — ABNORMAL HIGH (ref 0–105)

## 2014-07-04 LAB — PRO B NATRIURETIC PEPTIDE: Pro B Natriuretic peptide (BNP): 397.2 pg/mL (ref 0–450)

## 2014-07-04 MED ORDER — PIPERACILLIN-TAZOBACTAM 3.375 G IVPB
3.3750 g | Freq: Three times a day (TID) | INTRAVENOUS | Status: DC
  Administered 2014-07-04 – 2014-07-10 (×17): 3.375 g via INTRAVENOUS
  Filled 2014-07-04 (×21): qty 50

## 2014-07-04 MED ORDER — SODIUM CHLORIDE 0.9 % IV SOLN
INTRAVENOUS | Status: DC
  Administered 2014-07-04 (×2): 1000 mL via INTRAVENOUS
  Administered 2014-07-05: 200 mL/h via INTRAVENOUS
  Administered 2014-07-05: 1000 mL via INTRAVENOUS
  Administered 2014-07-05: 16:00:00 via INTRAVENOUS

## 2014-07-04 MED ORDER — GUAIFENESIN ER 600 MG PO TB12
1200.0000 mg | ORAL_TABLET | Freq: Two times a day (BID) | ORAL | Status: AC
  Administered 2014-07-04 – 2014-07-08 (×10): 1200 mg via ORAL
  Filled 2014-07-04 (×10): qty 2

## 2014-07-04 MED ORDER — VANCOMYCIN HCL IN DEXTROSE 1-5 GM/200ML-% IV SOLN
1000.0000 mg | Freq: Once | INTRAVENOUS | Status: AC
  Administered 2014-07-04: 1000 mg via INTRAVENOUS
  Filled 2014-07-04: qty 200

## 2014-07-04 MED ORDER — ACETAMINOPHEN 325 MG PO TABS
650.0000 mg | ORAL_TABLET | ORAL | Status: DC | PRN
  Administered 2014-07-04 – 2014-07-07 (×5): 650 mg via ORAL
  Filled 2014-07-04 (×5): qty 2

## 2014-07-04 MED ORDER — VANCOMYCIN HCL IN DEXTROSE 750-5 MG/150ML-% IV SOLN
750.0000 mg | Freq: Two times a day (BID) | INTRAVENOUS | Status: DC
  Administered 2014-07-05 – 2014-07-07 (×6): 750 mg via INTRAVENOUS
  Filled 2014-07-04 (×8): qty 150

## 2014-07-04 NOTE — Progress Notes (Signed)
Pt temp 100.4 oral.  Suanne Marker, PA notified and ordered to give tylenol.  Will continue to closely monitor.

## 2014-07-04 NOTE — Progress Notes (Signed)
Utilization review completed.  

## 2014-07-04 NOTE — Consult Note (Signed)
ANTIBIOTIC CONSULT NOTE - INITIAL  Pharmacy Consult for Vancomycin and Zosyn Indication: rule out sepsis  Allergies  Allergen Reactions  . Lisinopril Cough    Rxn: unknown  . Statins Other (See Comments)    Muscle ache  . Welchol [Colesevelam Hcl] Itching    Patient Measurements: Height: 5\' 9"  (175.3 cm) Weight: 203 lb 6.4 oz (92.262 kg) IBW/kg (Calculated) : 70.7  Vital Signs: Temp: 100.4 F (38 C) (10/01 1834) Temp Source: Oral (10/01 1834) BP: 119/69 mmHg (10/01 1341) Pulse Rate: 97 (10/01 1341) Intake/Output from previous day: 09/30 0701 - 10/01 0700 In: 400 [I.V.:400] Out: -  Intake/Output from this shift:    Labs:  Recent Labs  07/02/14 0531 07/03/14 0516 07/04/14 0556  WBC 5.4 4.1 11.4*  HGB 10.2* 10.2* 11.1*  PLT 222 241 280  CREATININE  --   --  1.45*   Estimated Creatinine Clearance: 48.6 ml/min (by C-G formula based on Cr of 1.45).  Microbiology: No results found for this or any previous visit (from the past 720 hour(s)).  Medical History: Past Medical History  Diagnosis Date  . History of: ST elevation myocardial infarction (STEMI) involving left circumflex coronary artery with complication 9371    PTCA-circumflex; PCI in 1991  . CAD, multiple vessel 1985-2010    Most recent cath 01/01/09: 100% Occluded LAD after SP1, patent LIMA-distal LAD with apical 90% lesion.  Cx-OM1 widely patent stent into proximal bifurcating OM1 . Follow-on Cx => 70-80% -- non-amenable PCI. Widely patent 3 overlapping stents in midRCA with only 40% RPL stenosis; SVG- OM known to be occluded.  . S/P CABG x 2 1997    LIMA-LAD, SVG-OM  . H/O unstable angina 06/2003, 12/2006, 05/2014    a) '04: Staged Taxus DES PCI to RCA and Cx-OM;;'08 - PCI to proximal ISR in RCA - Cypher DES; c) 05/2014: ISR in RCA & OM1, PCI with Promus P DES: RCA  2.75 x 12, OM1 3.0 mm x 8)  . History of nuclear stress test December 2013    LOW at Risk. Moderate region of mid to basal inferolateral scar  without ischemia -- consistent with distal circumflex disease. Mild apical hypokinesis with an EF of 47%.  . Irregular heart beat     PVCs and PACs, no arrhythmia recorded  . Exertional dyspnea     Chronic baseline SOB with ambulation  . Sleep apnea     pt. states he was told to return for f/u, to be fitted for CPAp, but pt. reports that he didn't follow up  . AAA (abdominal aortic aneurysm) 08/30/12    Doppler 08/30/12 had a small amount of growth. It was a 4.2 x 4.3 cm greater in size than the previous one noted at 4 x 3.8.  . Diabetes mellitus     Not on oral medication  . Hypertension   . Dyslipidemia, goal LDL below 70   . Osteoarthritis of both knees     And back; multiple back surgeries, right knee arthroplasty and left knee arthroscopic surgery x2  . Chronic back pain      multiple surgeries; C-spine and lumbar  . Erectile dysfunction   . H/O: pneumonia February 14  . Chronic anemia     On iron supplement; history of positive guaiac - negative colonoscopy in 1996.; Thought to be related to hemorrhoids; status post hemorrhoidectomy  . Ankle edema     Chronic  . Diverticulitis of colon 1996   Assessment: 78yom s/p ERCP on 9/30  now with fever. He will begin vancomycin and zosyn for possible sepsis. He does have acute renal insufficiency with sCr trending up to 1.46.   Goal of Therapy:  Vancomycin trough level 15-20 mcg/ml  Plan:  1) Vancomycin 1g IV x1 then 750mg  IV q12 2) Zosyn 3.375g IV q8 (4 hour infusion) 3) Follow renal function, cultures, LOT, level if needed  Deboraha Sprang 07/04/2014,8:35 PM

## 2014-07-04 NOTE — Progress Notes (Signed)
Called to the room by patient wife, upon arrival patient was shaking, short of breath, and complaining of being cold. Rectal temp checked, temp 102.8, O2 sats  85% on room air. Rapid response RN notified as well as cardiologist on call. Both came to the bedside. Tylenol was given and 2L nasal canula applied. Patient is now lying in the bed more comfortably. Will continue to monitor.

## 2014-07-04 NOTE — Progress Notes (Signed)
Patient seen, examined, and I agree with the above documentation, including the assessment and plan. Patient with post-ERCP pancreatitis, metal biliary stent placed yesterday without complication. No pancreatic duct manipulation Pain is better controlled with Dilaudid and patient is resting comfortably now Getting IV fluids, close attention to volume status Hopefully this will improve rather quickly, continue supportive care in the meantime Monitor liver enzymes post ERCP

## 2014-07-04 NOTE — Significant Event (Addendum)
Called to evaluate [name] due to fever to 100.4 with sweats.   He was given tylenol 650 about 1.5hrs before my exam.  States abdominal pain relatively unchanged compared to earlier today, improved compared to yesterday. Pain extends up to mid chest area. No nausea or vomiting. Has had ongoing cough.   On exam, mildly diaphoretic. Now afebrile. Abd mildly distended with mild diffuse TTP. No rebound or guarding. Bowel sounds hypoactive but present.   Impression Fever in setting of post ERCP pancreatitis. Fever may be related to pancreatitis although symptoms are improving on the whole. Abdominal exam not currently suggestive of intraperitoneal infection. Cannot rule out intravascular infection due to bacterial translocation during the procedure.   Plan Blood CX x 2 Vanc/Zosyn for possible infection Consider stopping antibiotics in 24-48hrs if blood cultures negative   Addendum Rigors and fever to 102.8 at 2300. Hypoxic to 85%, tachycardic to 120s. Wife anxious.  Suspect bacteremia as cause. Now having high fevers and rigors c/w GNR sepsis. Abdominal exam remains unchanged.  Patient began to look obviously better during re-exam and was sleeping comfortably once I left. Wife reassured extensively.  Continue with plans for abx Would consider abd CT in AM or sooner if abdominal exam worsens. For now will let him get some rest.

## 2014-07-04 NOTE — Progress Notes (Signed)
Daily Rounding Note  07/04/2014, 8:36 AM  LOS: 7 days   SUBJECTIVE:       Upper abdominal pain, nausea started with dinner of pasta/marinara type sauce last night.  Pain requiring Dilaudid over night and persists this AM.  No vomiting.  On clears.  OBJECTIVE:         Vital signs in last 24 hours:    Temp:  [97.6 F (36.4 C)-98.6 F (37 C)] 98.3 F (36.8 C) (10/01 0500) Pulse Rate:  [53-78] 78 (10/01 0500) Resp:  [14-26] 18 (10/01 0500) BP: (120-170)/(64-92) 139/68 mmHg (10/01 0500) SpO2:  [95 %-100 %] 95 % (10/01 0500) Last BM Date: 07/03/14 Filed Weights   07/01/14 0507 07/02/14 0500 07/03/14 0500  Weight: 94.121 kg (207 lb 8 oz) 94 kg (207 lb 3.7 oz) 91 kg (200 lb 9.9 oz)   General: seems weaker, mostly comfortable.    Heart: RRR Chest: clear but dyspneic with speech and sounds congested Abdomen: soft, slightly tender in upper abdomen.  No guard or rebound.  BS present but reduced.  Extremities: no CCE Neuro/Psych:  Oriented x 3.  No limb weakness. Appropriate.   Intake/Output from previous day: 09/30 0701 - 10/01 0700 In: 400 [I.V.:400] Out: -   Intake/Output this shift:    Lab Results:  Recent Labs  07/02/14 0531 07/03/14 0516 07/04/14 0556  WBC 5.4 4.1 11.4*  HGB 10.2* 10.2* 11.1*  HCT 29.8* 29.9* 33.4*  PLT 222 241 280   BMET  Recent Labs  07/04/14 0556  NA 136*  K 4.7  CL 99  CO2 24  GLUCOSE 229*  BUN 27*  CREATININE 1.45*  CALCIUM 9.5   LFT  Recent Labs  07/02/14 0531 07/04/14 0556  PROT 6.5 7.2  ALBUMIN 2.6* 2.8*  AST 65* 102*  ALT 68* 100*  ALKPHOS 173* 225*  BILITOT 3.8* 4.7*  BILIDIR 2.7*  --   IBILI 1.1*  --    Lipase     Component Value Date/Time   LIPASE >3000* 07/04/2014 0556     Studies/Results: Dg Ercp Biliary & Pancreatic Ducts  07/03/2014   CLINICAL DATA:  ERCP  EXAM: ERCP  TECHNIQUE: Multiple spot images obtained with the fluoroscopic device and  submitted for interpretation post-procedure.  COMPARISON:  None.  FINDINGS: Images demonstrate cannulation of the common bile duct. Contrast fills the biliary tree. A stent has been placed across the ampulla. Filling defects are present in the mid common bile duct.  IMPRESSION: See above.  These images were submitted for radiologic interpretation only. Please see the procedural report for the amount of contrast and the fluoroscopy time utilized.   Electronically Signed   By: Maryclare Bean M.D.   On: 07/03/2014 13:12   Dg Abd 2 Views  07/04/2014   CLINICAL DATA:  Abdominal pain, post ERCP  EXAM: ABDOMEN - 2 VIEW  COMPARISON:  MRCP dated 06/30/2014  FINDINGS: Nonspecific bowel gas pattern, with mildly prominent loops of small bowel in the central abdomen, likely reflecting adynamic small bowel ileus or less likely partial small bowel obstruction.  Moderate left colonic stool burden.  No evidence of free air under the diaphragm on the upright view.  Common duct stent.  Lumbar spine fixation hardware.  IMPRESSION: Mildly prominent loops of small bowel in the central abdomen, likely reflecting adynamic small bowel ileus, less likely partial small bowel obstruction.  Moderate left colonic stool burden.  Common duct stent.  No free  air.   Electronically Signed   By: Julian Hy M.D.   On: 07/04/2014 01:01   Scheduled Meds: . amLODipine  5 mg Oral Daily  . aspirin EC  81 mg Oral Daily  . clopidogrel  75 mg Oral Daily  . ezetimibe  10 mg Oral Daily  . fenofibrate  160 mg Oral Daily  . ferrous sulfate  325 mg Oral Q breakfast  . indomethacin  100 mg Rectal Once  . insulin aspart  0-15 Units Subcutaneous TID WC  . insulin aspart  0-5 Units Subcutaneous QHS  . isosorbide mononitrate  30 mg Oral Daily  . metoprolol succinate  25 mg Oral Daily  . pantoprazole  40 mg Oral Daily  . scopolamine       Continuous Infusions:  PRN Meds:.acetaminophen, HYDROmorphone (DILAUDID) injection, nitroGLYCERIN, ondansetron  (ZOFRAN) IV  ASSESMENT:   *  Post ERCP pancreatitis. Did not receive post ERCP indomethacin.  *  SB ileus.   *  Choledocholithiasis ERCP 9/30 inspissated sludge emerged after metal stent placement. No spphincterotomy as pt on Plavix for one yea  * Unstable angina. DES placed 05/17/14 and committed to one year of Plavix.   * CHF. Mild LV dysfunction EF 45%per cath report of 06/28/14. Current sats 100% but is dyspneic.   *  Cholelithiasis, no plans for GB surgery at present until after pt off Plavix.   *  ARF.      PLAN   *  Supportive care including IVF, clears, anelgesics.  Measure I/Os.  Given heart failure hx and current dyspnea, conservatively start NS at 125/hour but would like to be more aggressive with IVF (200 to 250/hour if ok with cardiologist, call placed to PA to seek their input)  *  If sxs continue another 24 hours, consider CT scan.  But with ARF, prefer to avoid study which optimally is performed with contrast.   *  Stop po iron for now given ileus.     [name]  07/04/2014, 8:36 AM Pager: [phone]

## 2014-07-04 NOTE — Progress Notes (Signed)
Pt in room coughing and unable to clear congestion.  Crackles noted on exhalation, positioned pt sitting up in chair and incentive spirometer given.  Suanne Marker,  [place] notified.  Will continue to monitor.

## 2014-07-04 NOTE — Progress Notes (Signed)
Principle diagnosis for this admission is not unstable angina it is Choledocholithiasis. There is no active cardiac condition this admission. Now with post  ERCP pancreatitis. Still on DAPT. IV fluids okay but will not tolerate > 125 cc/hr due to chronic DHF and likelihood of acute exacerbation. Check baseline BNP and follow for evidence CHF.

## 2014-07-04 NOTE — Significant Event (Signed)
Rapid Response Event Note Called by primary RN to see pt for possible rigors Overview: Time Called: 2313 Arrival Time: 2316 Event Type: Other (Comment)  Initial Focused Assessment: On arrival to room , the pt is resting on his Rt side & appears to be having rigors.  Oral temp 98.9. Checked rectal t=102.8.  IV antibiotics infusing.  Pt's wife very anxious & attempts made to reassure here what was happening with the rigors & temp.  Tylenol given per MD order & Md arrived at bedside.  Interventions:  Tylenol 625mg  po Event Summary: Name of Physician Notified: Dr. Luiz Ochoa at 2335    at          [facility], [name]

## 2014-07-05 ENCOUNTER — Inpatient Hospital Stay (HOSPITAL_COMMUNITY): Payer: Medicare Other | Admitting: Certified Registered Nurse Anesthetist

## 2014-07-05 ENCOUNTER — Encounter (HOSPITAL_COMMUNITY): Payer: Self-pay | Admitting: Gastroenterology

## 2014-07-05 ENCOUNTER — Encounter (HOSPITAL_COMMUNITY)
Admission: EM | Disposition: A | Discharge status: Home / Self Care | Payer: Self-pay | Source: Home / Self Care | Attending: Cardiology

## 2014-07-05 ENCOUNTER — Encounter (HOSPITAL_COMMUNITY): Payer: Medicare Other | Admitting: Certified Registered Nurse Anesthetist

## 2014-07-05 ENCOUNTER — Inpatient Hospital Stay (HOSPITAL_COMMUNITY): Payer: Medicare Other

## 2014-07-05 DIAGNOSIS — Z9861 Coronary angioplasty status: Secondary | ICD-10-CM

## 2014-07-05 DIAGNOSIS — K8309 Other cholangitis: Secondary | ICD-10-CM | POA: Diagnosis present

## 2014-07-05 DIAGNOSIS — I2510 Atherosclerotic heart disease of native coronary artery without angina pectoris: Secondary | ICD-10-CM

## 2014-07-05 DIAGNOSIS — K830 Cholangitis: Secondary | ICD-10-CM

## 2014-07-05 DIAGNOSIS — K8050 Calculus of bile duct without cholangitis or cholecystitis without obstruction: Secondary | ICD-10-CM

## 2014-07-05 HISTORY — PX: ERCP: SHX5425

## 2014-07-05 LAB — CBC
HEMATOCRIT: 28.2 % — AB (ref 39.0–52.0)
Hemoglobin: 9.6 g/dL — ABNORMAL LOW (ref 13.0–17.0)
MCH: 31.8 pg (ref 26.0–34.0)
MCHC: 34 g/dL (ref 30.0–36.0)
MCV: 93.4 fL (ref 78.0–100.0)
PLATELETS: 233 10*3/uL (ref 150–400)
RBC: 3.02 MIL/uL — AB (ref 4.22–5.81)
RDW: 14.5 % (ref 11.5–15.5)
WBC: 10.4 10*3/uL (ref 4.0–10.5)

## 2014-07-05 LAB — GLUCOSE, CAPILLARY
GLUCOSE-CAPILLARY: 169 mg/dL — AB (ref 70–99)
GLUCOSE-CAPILLARY: 169 mg/dL — AB (ref 70–99)
Glucose-Capillary: 150 mg/dL — ABNORMAL HIGH (ref 70–99)
Glucose-Capillary: 150 mg/dL — ABNORMAL HIGH (ref 70–99)

## 2014-07-05 LAB — LIPASE, BLOOD: Lipase: 1873 U/L — ABNORMAL HIGH (ref 11–59)

## 2014-07-05 LAB — COMPREHENSIVE METABOLIC PANEL
ALK PHOS: 204 U/L — AB (ref 39–117)
ALT: 85 U/L — ABNORMAL HIGH (ref 0–53)
AST: 68 U/L — AB (ref 0–37)
Albumin: 2.4 g/dL — ABNORMAL LOW (ref 3.5–5.2)
Anion gap: 14 (ref 5–15)
BILIRUBIN TOTAL: 7.5 mg/dL — AB (ref 0.3–1.2)
BUN: 37 mg/dL — AB (ref 6–23)
CHLORIDE: 98 meq/L (ref 96–112)
CO2: 20 mEq/L (ref 19–32)
Calcium: 8.7 mg/dL (ref 8.4–10.5)
Creatinine, Ser: 1.72 mg/dL — ABNORMAL HIGH (ref 0.50–1.35)
GFR calc Af Amer: 43 mL/min — ABNORMAL LOW (ref >90)
GFR calc non Af Amer: 37 mL/min — ABNORMAL LOW (ref >90)
Glucose, Bld: 154 mg/dL — ABNORMAL HIGH (ref 70–99)
POTASSIUM: 4.7 meq/L (ref 3.7–5.3)
Sodium: 132 mEq/L — ABNORMAL LOW (ref 137–147)
Total Protein: 6.2 g/dL (ref 6.0–8.3)

## 2014-07-05 SURGERY — ERCP, WITH INTERVENTION IF INDICATED
Anesthesia: General

## 2014-07-05 MED ORDER — SODIUM CHLORIDE 0.9 % IV SOLN
INTRAVENOUS | Status: DC | PRN
  Administered 2014-07-05: 19:00:00

## 2014-07-05 MED ORDER — DEXAMETHASONE SODIUM PHOSPHATE 4 MG/ML IJ SOLN
INTRAMUSCULAR | Status: DC | PRN
  Administered 2014-07-05: 4 mg via INTRAVENOUS

## 2014-07-05 MED ORDER — INDOMETHACIN 50 MG RE SUPP
100.0000 mg | Freq: Once | RECTAL | Status: AC
  Administered 2014-07-05: 100 mg via RECTAL
  Filled 2014-07-05: qty 2

## 2014-07-05 MED ORDER — SODIUM CHLORIDE 0.9 % IV SOLN
INTRAVENOUS | Status: DC
  Administered 2014-07-05: 21:00:00 via INTRAVENOUS

## 2014-07-05 MED ORDER — ONDANSETRON HCL 4 MG/2ML IJ SOLN
INTRAMUSCULAR | Status: DC | PRN
  Administered 2014-07-05: 4 mg via INTRAVENOUS

## 2014-07-05 MED ORDER — ETOMIDATE 2 MG/ML IV SOLN
INTRAVENOUS | Status: DC | PRN
  Administered 2014-07-05: 14 mg via INTRAVENOUS

## 2014-07-05 MED ORDER — GLUCAGON HCL RDNA (DIAGNOSTIC) 1 MG IJ SOLR
INTRAMUSCULAR | Status: AC
  Filled 2014-07-05: qty 1

## 2014-07-05 MED ORDER — NEOSTIGMINE METHYLSULFATE 10 MG/10ML IV SOLN
INTRAVENOUS | Status: DC | PRN
  Administered 2014-07-05: 3 mg via INTRAVENOUS

## 2014-07-05 MED ORDER — FENTANYL CITRATE 0.05 MG/ML IJ SOLN
INTRAMUSCULAR | Status: DC | PRN
  Administered 2014-07-05 (×2): 25 ug via INTRAVENOUS
  Administered 2014-07-05: 100 ug via INTRAVENOUS

## 2014-07-05 MED ORDER — LIDOCAINE HCL (CARDIAC) 20 MG/ML IV SOLN
INTRAVENOUS | Status: DC | PRN
  Administered 2014-07-05: 100 mg via INTRAVENOUS

## 2014-07-05 MED ORDER — DIPHENHYDRAMINE HCL 50 MG/ML IJ SOLN
INTRAMUSCULAR | Status: DC | PRN
  Administered 2014-07-05: 10 mg via INTRAVENOUS

## 2014-07-05 MED ORDER — SODIUM CHLORIDE 0.9 % IV SOLN
INTRAVENOUS | Status: DC
  Administered 2014-07-05: 1000 mL via INTRAVENOUS

## 2014-07-05 MED ORDER — GLYCOPYRROLATE 0.2 MG/ML IJ SOLN
INTRAMUSCULAR | Status: DC | PRN
  Administered 2014-07-05: 0.4 mg via INTRAVENOUS

## 2014-07-05 MED ORDER — ROCURONIUM BROMIDE 100 MG/10ML IV SOLN
INTRAVENOUS | Status: DC | PRN
  Administered 2014-07-05: 20 mg via INTRAVENOUS

## 2014-07-05 MED ORDER — MIDAZOLAM HCL 5 MG/5ML IJ SOLN
INTRAMUSCULAR | Status: DC | PRN
  Administered 2014-07-05: 2 mg via INTRAVENOUS

## 2014-07-05 MED ORDER — HYDROMORPHONE HCL 1 MG/ML IJ SOLN
INTRAMUSCULAR | Status: AC
  Filled 2014-07-05: qty 1

## 2014-07-05 NOTE — Progress Notes (Signed)
Daily Rounding Note  07/05/2014, 8:41 AM  LOS: 8 days   SUBJECTIVE:       Rapid response team called at 2300 last night for rigors, SOB. Pain 10/10 and nausea with scant emesis. Rectal temp was 102.8, sats 85% on room air.  Zosyn/vanc initiated, dosed with Tylenol and afebrile since.  Pain about a 5 currently, Last Dilaudid was at 2120 last night.  On clears. IVF at 125/hour. Urine is deeper colored, less frequent.    OBJECTIVE:         Vital signs in last 24 hours:    Temp:  [97.7 F (36.5 C)-102.8 F (39.3 C)] 98.1 F (36.7 C) (10/02 0821) Pulse Rate:  [69-97] 77 (10/02 0821) Resp:  [16-18] 18 (10/02 0821) BP: (119-154)/(67-97) 154/77 mmHg (10/02 0500) SpO2:  [85 %-100 %] 100 % (10/02 0821) Weight:  [92.262 kg (203 lb 6.4 oz)-93.26 kg (205 lb 9.6 oz)] 93.26 kg (205 lb 9.6 oz) (10/02 0500) Last BM Date: 07/03/14 Filed Weights   07/03/14 0500 July 13, 2014 1422 07/05/14 0500  Weight: 91 kg (200 lb 9.9 oz) 92.262 kg (203 lb 6.4 oz) 93.26 kg (205 lb 9.6 oz)   General: comfortable.  Looks ill, not toxic   Heart: NSR in 70s on monitor.  1/6 systolic murmer Chest: clear bil.  No labored breathing or cough Abdomen: soft, BS present.  Minor tenderness in upper abdomen.  Extremities: no CCE Neuro/Psych:  Pleasant, not confused.    Intake/Output from previous day: 07-14-2023 0701 - 10/02 0700 In: 480 [P.O.:480] Out: 1140 [Urine:1140]  Intake/Output this shift:    Lab Results:  Recent Labs  07/03/14 0516 July 13, 2014 0556 07/05/14 0500  WBC 4.1 11.4* 10.4  HGB 10.2* 11.1* 9.6*  HCT 29.9* 33.4* 28.2*  PLT 241 280 233   BMET  Recent Labs  13-Jul-2014 0556 07/05/14 0500  NA 136* 132*  K 4.7 4.7  CL 99 98  CO2 24 20  GLUCOSE 229* 154*  BUN 27* 37*  CREATININE 1.45* 1.72*  CALCIUM 9.5 8.7   LFT  Recent Labs  07/13/14 0556 07/05/14 0500  PROT 7.2 6.2  ALBUMIN 2.8* 2.4*  AST 102* 68*  ALT 100* 85*  ALKPHOS 225*  204*  BILITOT 4.7* 7.5*      Component Value Date/Time   LIPASE 1873* 07/05/2014 0500    Studies/Results: Dg Chest 2 View Jul 13, 2014   CLINICAL DATA:  New productive cough, congestion, personal history of type 2 diabetes, hypertension, coronary artery disease post CABG, dyslipidemia, abdominal aortic aneurysm, initial encounter  EXAM: CHEST  2 VIEW  COMPARISON:  06/27/2014  FINDINGS: Normal heart size post median sternotomy by history CABG.  Mediastinal contours and pulmonary vascularity normal.  Atherosclerotic calcification.  Minimal elevation of LEFT diaphragm and LEFT basilar atelectasis.  No acute infiltrate, pleural effusion or pneumothorax.  Prior cervical spine fusion.  IMPRESSION: Minimal LEFT basilar atelectasis.   Electronically Signed   By: Lavonia Dana M.D.   On: 07-13-2014 14:41   Scheduled Meds: . amLODipine  5 mg Oral Daily  . aspirin EC  81 mg Oral Daily  . clopidogrel  75 mg Oral Daily  . ezetimibe  10 mg Oral Daily  . fenofibrate  160 mg Oral Daily  . guaiFENesin  1,200 mg Oral BID  . insulin aspart  0-15 Units Subcutaneous TID WC  . insulin aspart  0-5 Units Subcutaneous QHS  . isosorbide mononitrate  30 mg Oral Daily  .  metoprolol succinate  25 mg Oral Daily  . pantoprazole  40 mg Oral Daily  . piperacillin-tazobactam (ZOSYN)  IV  3.375 g Intravenous 3 times per day  . scopolamine      . vancomycin  750 mg Intravenous Q12H   Continuous Infusions: . sodium chloride 1,000 mL (07/04/14 1458)   PRN Meds:.acetaminophen, HYDROmorphone (DILAUDID) injection, nitroGLYCERIN, ondansetron (ZOFRAN) IVezetimibe  ASSESMENT:   * Post ERCP pancreatitis. Fevers overnight. Started Zosyn, Vanc per Dr Ellyn Hack. LFTs worse: now jaundiced, Lipase improved. Is/Os negative 660 * SB ileus.  * Choledocholithiasis  ERCP 9/30 inspissated sludge emerged after metal stent placement. No spphincterotomy as pt on Plavix for one yea  * Unstable angina. DES placed 05/17/14 and committed to one  year of Plavix.  * CHF. Mild LV dysfunction EF 45% per cath report of 06/28/14. Current sats 100% but is dyspneic.  * Cholelithiasis, no plans for GB surgery at present until after pt off Plavix.  * ARF.  Worsening renal function.  *  Normocytic anemia.   PLAN   *  Going to increase fluids to 200 per hour.  If necessary can use Lasix prn if developes s/s of heart failure.  But with rising BUN/creatinine, fever, aggressive hydration is needed. This was d/w Dr Tamala Julian from cardiology.  *  As to CT scan, would prefer to obtain IV contrasted study but if we do it today would need to be non-contrasted.   *  CBC, CMET in AM *  Wonder if we should stop Fenofibrate or Ezetemibe for now?  Dr Tamala Julian says ok to this if GI feels it would lessen risk to pancreas/liver.     [name]  07/05/2014, 8:41 AM  Pt seen and examined.  Suspect pt developed septic shock last eve secondary to an impacted CBD stone despite stent placement.  This would account for worsening LFTs  Plan repeat ERCP. The risks, benefits, and possible complications of the procedure, including  But not limited to bleeding, perforation, surgery, and the 5-10% risk for pancreatitis, were explained to the patient.  It was explained that  post ERCP pancreatitis which, while usually is mild, occasionally maybe severe and even life-threatening.  Patient's questions were answered. Plan to give rectal indomethacin with the hopes that this will decrease risk for recurrent pancreatitis. Pager: [phone]

## 2014-07-05 NOTE — Anesthesia Preprocedure Evaluation (Addendum)
Anesthesia Evaluation  Patient identified by MRN, date of birth, ID band Patient awake  General Assessment Comment:Clear liquids @ 1230  Reviewed: Allergy & Precautions, H&P , NPO status , Patient's Chart, lab work & pertinent test results  Airway Mallampati: I TM Distance: >3 FB Neck ROM: Full    Dental  (+) Partial Upper, Dental Advisory Given,    Pulmonary shortness of breath and with exertion, sleep apnea and Continuous Positive Airway Pressure Ventilation , former smoker,  breath sounds clear to auscultation        Cardiovascular hypertension, + angina + CAD, + Past MI, + Peripheral Vascular Disease and +CHF Rhythm:Regular Rate:Normal     Neuro/Psych    GI/Hepatic GERD-  Medicated,  Endo/Other  diabetes, Well Controlled, Oral Hypoglycemic Agents  Renal/GU Renal InsufficiencyRenal disease     Musculoskeletal  (+) Arthritis -,   Abdominal   Peds  Hematology   Anesthesia Other Findings   Reproductive/Obstetrics                         Anesthesia Physical Anesthesia Plan  ASA: III  Anesthesia Plan: General   Post-op Pain Management:    Induction: Intravenous  Airway Management Planned: Oral ETT  Additional Equipment:   Intra-op Plan:   Post-operative Plan: Extubation in OR and Possible Post-op intubation/ventilation  Informed Consent:   Dental advisory given  Plan Discussed with: CRNA and Anesthesiologist  Anesthesia Plan Comments:        Anesthesia Quick Evaluation

## 2014-07-05 NOTE — Op Note (Signed)
[facility] [place], [id number]   ERCP PROCEDURE REPORT        EXAM DATE: 07-Jul-2014  PATIENT NAME:          [name], [name]          MR #: [id number] BIRTHDATE:       03/18/1936     VISIT #:     [phone] ATTENDING:     Inda Castle, MD     STATUS:     inpatient ASSISTANT:      Leane Para, and Colstrip, Autumn  INDICATIONS:  The patient is a 78 yr old male here for an ERCP due to sludge and pus, ampulla and suspected ascending cholangitis.  PROCEDURE PERFORMED:     ERCP with removal of calculus/calculi ERCP with stent replacement MEDICATIONS:     Per Anesthesia  CONSENT: The patient understands the risks and benefits of the procedure and understands that these risks include, but are not limited to: sedation, allergic reaction, infection, perforation and/or bleeding. Alternative means of evaluation and treatment include, among others: physical exam, x-rays, and/or surgical intervention. The patient elects to proceed with this endoscopic procedure.  DESCRIPTION OF PROCEDURE: During intra-op preparation period all mechanical & medical equipment was checked for proper function. Hand hygiene and appropriate measures for infection prevention was taken. After the risks, benefits and alternatives of the procedure were thoroughly explained, Informed was verified, confirmed and timeout was successfully executed by the treatment team. With the patient in left semi-prone position, medications were administered intravenously.The [id number] ([id number]) was passed from the mouth into the esophagus and further advanced from the esophagus into the stomach. From stomach scope was directed to the second portion of the duodenum.  Major papilla was aligned with the duodenoscope. The scope position was confirmed fluoroscopically. Rest of the findings/therapeutics are given below. The scope was then completely withdrawn from  the patient and the procedure completed. The pulse, BP, and O2 saturation were monitored and documented by the physician and the nursing staff throughout the entire procedure. The patient was cared for as planned according to standard protocol. The patient was then discharged to recovery in stable condition and with appropriate post procedure care.  The common bile duct was directly cannulated with a 0.35 mm guidewire.  Injection of the bile duct demonstrated multiple filling defects throughout the length of the duct.  A #15 mm biliary stone extractor was inserted into the proximal portion of the duct and swept multiple times.  Large amounts of pus, stones and sludge were delivered into the duodenum.  Following this there appeared to be free flow of bile into the duodenum. The indwelling metal biliary stent was removed with a forceps grasper.  The bile duct was again cannulated with a balloon catheter.  The 50 mm balloon stone extractor was again swept down the duct, then reduce to 12 mm and pulled through the ampulla.  No more stones or sludge was seen.  At this point a #10 [place] 6 cm covered biliary Wallstent was placed.    ADVERSE EVENT: IMPRESSIONS:     cholangitis secondary to clot biliary stent, status post clearance of the bile duct of stones and sludge and replacement of biliary stent  RECOMMENDATIONS:     Continue broad-spectrum antibiotics REPEAT EXAM:   ___________________________________ Inda Castle, MD eSigned:  Inda Castle, MD 07-07-14 6:10 PM   cc:  CPT CODES: ICD9 CODES:  The ICD and CPT  codes recommended by this software are interpretations from the data that the clinical staff has captured with the software.  The verification of the translation of this report to the ICD and CPT codes and modifiers is the sole responsibility of the health care institution and practicing physician where this report was generated.  [employer]  will not be held responsible for the validity of the ICD and CPT codes included on this report.  AMA assumes no liability for data contained or not contained herein. CPT is a Designer, television/film set of the [employer].   PATIENT NAME:  [name] MR#: [id number]

## 2014-07-05 NOTE — Progress Notes (Signed)
       Patient Name: [name] Date of Encounter: 07/05/2014    SUBJECTIVE: Feels better than last night. No CV complaints.  TELEMETRY:  NSR Filed Vitals:   07/04/14 2342 07/05/14 0131 07/05/14 0500 07/05/14 0821  BP: 135/97  154/77   Pulse:   72 77  Temp:  99 F (37.2 C) 97.7 F (36.5 C) 98.1 F (36.7 C)  TempSrc:  Oral Oral Oral  Resp:   18 18  Height:      Weight:   205 lb 9.6 oz (93.26 kg)   SpO2: 100%  97% 100%    Intake/Output Summary (Last 24 hours) at 07/05/14 0906 Last data filed at 07/05/14 0700  Gross per 24 hour  Intake    240 ml  Output   1140 ml  Net   -900 ml   LABS: Basic Metabolic Panel:  Recent Labs  07/04/14 0556 07/05/14 0500  NA 136* 132*  K 4.7 4.7  CL 99 98  CO2 24 20  GLUCOSE 229* 154*  BUN 27* 37*  CREATININE 1.45* 1.72*  CALCIUM 9.5 8.7   CBC:  Recent Labs  07/04/14 0556 07/05/14 0500  WBC 11.4* 10.4  HGB 11.1* 9.6*  HCT 33.4* 28.2*  MCV 93.8 93.4  PLT 280 233   BNP    Component Value Date/Time   PROBNP 397.2 07/04/2014 1300     Radiology/Studies:  No new data  Physical Exam: Blood pressure 154/77, pulse 77, temperature 98.1 F (36.7 C), temperature source Oral, resp. rate 18, height 5\' 9"  (1.753 m), weight 205 lb 9.6 oz (93.26 kg), SpO2 100.00%. Weight change:   Wt Readings from Last 3 Encounters:  07/05/14 205 lb 9.6 oz (93.26 kg)  07/05/14 205 lb 9.6 oz (93.26 kg)  07/05/14 205 lb 9.6 oz (93.26 kg)    No gallop and neck veins are flat.  ASSESSMENT:  1. Post ERCP with pancreatitis and fever episode suggestive of bacteremia. 2. Jaundice this AM 3. CAD stable 4. Stable chronic DHF with reassuring BNP  Plan:  1. We will continue to follow but allow GI to assume the lead since there is no active cardiac problem. 2. Please call if concerns or questions.  Demetrios Isaacs 07/05/2014, 9:06 AM

## 2014-07-05 NOTE — Transfer of Care (Signed)
Immediate Anesthesia Transfer of Care Note  Patient: [name]  Procedure(s) Performed: Procedure(s): ENDOSCOPIC RETROGRADE CHOLANGIOPANCREATOGRAPHY (ERCP) (N/A)  Patient Location: PACU  Anesthesia Type:General  Level of Consciousness: awake and alert   Airway & Oxygen Therapy: Patient Spontanous Breathing and Patient connected to face mask oxygen  Post-op Assessment: Report given to PACU RN, Post -op Vital signs reviewed and stable and Patient moving all extremities X 4  Post vital signs: Reviewed and stable  Complications: No apparent anesthesia complications

## 2014-07-05 NOTE — Progress Notes (Signed)
ERCP demonstrated high-grade obstruction of the biliary stent secondary to stones and large amounts of sludge lodged in the biliary stent..  The bile duct was completely cleared of sludge and stone debris.  The covered metal biliary stent was replaced with a new stent.  There was a large amount of pus that was seen extruding from the papilla indicating cholangitis.  Recommendations #1 continue broad-spectrum antibiotics

## 2014-07-05 NOTE — Progress Notes (Signed)
Denies any pain or nausea/ "this cough is bad" --- has strong nonproductive cough, no h/o spasm on extubation, no reflux per pt

## 2014-07-05 NOTE — Anesthesia Procedure Notes (Signed)
Procedure Name: Intubation Date/Time: 07/05/2014 4:33 PM Performed by: Ollen Bowl Pre-anesthesia Checklist: Patient identified, Emergency Drugs available, Suction available, Patient being monitored and Timeout performed Patient Re-evaluated:Patient Re-evaluated prior to inductionOxygen Delivery Method: Circle system utilized and Simple face mask Preoxygenation: Pre-oxygenation with 100% oxygen Intubation Type: IV induction, Cricoid Pressure applied and Rapid sequence Laryngoscope Size: Miller and 3 Grade View: Grade I Tube type: Oral Tube size: 7.5 mm Number of attempts: 1 Airway Equipment and Method: Patient positioned with wedge pillow and Stylet Placement Confirmation: ETT inserted through vocal cords under direct vision,  positive ETCO2 and breath sounds checked- equal and bilateral Secured at: 22 cm Tube secured with: Tape Dental Injury: Teeth and Oropharynx as per pre-operative assessment

## 2014-07-06 DIAGNOSIS — Z7902 Long term (current) use of antithrombotics/antiplatelets: Secondary | ICD-10-CM

## 2014-07-06 DIAGNOSIS — R05 Cough: Secondary | ICD-10-CM

## 2014-07-06 DIAGNOSIS — E118 Type 2 diabetes mellitus with unspecified complications: Secondary | ICD-10-CM

## 2014-07-06 DIAGNOSIS — Z951 Presence of aortocoronary bypass graft: Secondary | ICD-10-CM

## 2014-07-06 DIAGNOSIS — I714 Abdominal aortic aneurysm, without rupture: Secondary | ICD-10-CM

## 2014-07-06 LAB — GLUCOSE, CAPILLARY
Glucose-Capillary: 146 mg/dL — ABNORMAL HIGH (ref 70–99)
Glucose-Capillary: 160 mg/dL — ABNORMAL HIGH (ref 70–99)
Glucose-Capillary: 195 mg/dL — ABNORMAL HIGH (ref 70–99)
Glucose-Capillary: 156 mg/dL — ABNORMAL HIGH (ref 70–99)

## 2014-07-06 LAB — CBC
HCT: 26.2 % — ABNORMAL LOW (ref 39.0–52.0)
Hemoglobin: 8.9 g/dL — ABNORMAL LOW (ref 13.0–17.0)
MCH: 30.6 pg (ref 26.0–34.0)
MCHC: 34 g/dL (ref 30.0–36.0)
MCV: 90 fL (ref 78.0–100.0)
Platelets: 201 10*3/uL (ref 150–400)
RBC: 2.91 MIL/uL — AB (ref 4.22–5.81)
RDW: 14.2 % (ref 11.5–15.5)
WBC: 7.9 10*3/uL (ref 4.0–10.5)

## 2014-07-06 LAB — HEPATIC FUNCTION PANEL
ALK PHOS: 182 U/L — AB (ref 39–117)
ALT: 66 U/L — ABNORMAL HIGH (ref 0–53)
AST: 51 U/L — ABNORMAL HIGH (ref 0–37)
Albumin: 2.1 g/dL — ABNORMAL LOW (ref 3.5–5.2)
BILIRUBIN INDIRECT: 0.9 mg/dL (ref 0.3–0.9)
Bilirubin, Direct: 7.4 mg/dL — ABNORMAL HIGH (ref 0.0–0.3)
Total Bilirubin: 8.3 mg/dL — ABNORMAL HIGH (ref 0.3–1.2)
Total Protein: 5.9 g/dL — ABNORMAL LOW (ref 6.0–8.3)

## 2014-07-06 NOTE — Progress Notes (Signed)
Progress Note   Subjective  Pt feels "alot better" this morning, said pain was immediately better after ERCP yesterday Had repeat ERCP with occluded biliary stent with pus behind stone, consistent with infection Ate clear liquid diet without issues Still with cough, nonproductive   Objective  Vital signs in last 24 hours: Temp:  [97.5 F (36.4 C)-100.1 F (37.8 C)] 97.7 F (36.5 C) (10/03 0500) Pulse Rate:  [59-87] 59 (10/03 0500) Resp:  [13-32] 18 (10/03 0500) BP: (111-187)/(46-76) 111/46 mmHg (10/03 0500) SpO2:  [94 %-99 %] 97 % (10/03 0500) Weight:  [207 lb 1.6 oz (93.94 kg)] 207 lb 1.6 oz (93.94 kg) (10/03 0500) Last BM Date: 07/03/14 Gen: awake, alert, NAD HEENT:icteric sclera, op clear CV: RRR Pulm: slightly decreased b/l bases Abd: soft, less tender, mildly distended, +bs Ext: no c/c, trace edema Neuro: nonfocal  Intake/Output from previous day: 10/02 0701 - 10/03 0700 In: 800 [I.V.:800] Out: 1225 [Urine:1225] Intake/Output this shift: Total I/O In: -  Out: 325 [Urine:325]  Lab Results:  Recent Labs  07/04/14 0556 07/05/14 0500 07/06/14 0535  WBC 11.4* 10.4 7.9  HGB 11.1* 9.6* 8.9*  HCT 33.4* 28.2* 26.2*  PLT 280 233 201   BMET  Recent Labs  07/04/14 0556 07/05/14 0500  NA 136* 132*  K 4.7 4.7  CL 99 98  CO2 24 20  GLUCOSE 229* 154*  BUN 27* 37*  CREATININE 1.45* 1.72*  CALCIUM 9.5 8.7   LFT  Recent Labs  07/06/14 0535  PROT 5.9*  ALBUMIN 2.1*  AST 51*  ALT 66*  ALKPHOS 182*  BILITOT 8.3*  BILIDIR 7.4*  IBILI 0.9   Studies/Results: Dg Chest 2 View  07/04/2014   CLINICAL DATA:  New productive cough, congestion, personal history of type 2 diabetes, hypertension, coronary artery disease post CABG, dyslipidemia, abdominal aortic aneurysm, initial encounter  EXAM: CHEST  2 VIEW  COMPARISON:  06/27/2014  FINDINGS: Normal heart size post median sternotomy by history CABG.  Mediastinal contours and pulmonary vascularity normal.   Atherosclerotic calcification.  Minimal elevation of LEFT diaphragm and LEFT basilar atelectasis.  No acute infiltrate, pleural effusion or pneumothorax.  Prior cervical spine fusion.  IMPRESSION: Minimal LEFT basilar atelectasis.   Electronically Signed   By: Lavonia Dana M.D.   On: 07/04/2014 14:41   Dg Ercp  07/05/2014   CLINICAL DATA:  Biliary stent obstruction. Removal of existing biliary stent and placement of new stent. Patient with pancreatitis, jaundice and fever prior to the procedure.  EXAM: ERCP  TECHNIQUE: Multiple spot images obtained with the fluoroscopic device and submitted for interpretation post-procedure.  COMPARISON:  MRCP 06/30/2014  FINDINGS: Examination demonstrates evidence of the pre-existing biliary stent with subsequent cannulation of the common bile duct an opacification of the common bile duct and intrahepatic ducts. There is mild dilatation of the common bile duct and central intrahepatic ducts without definite focal mass, filling defect or stricture. Final image demonstrates a biliary stent which appears in adequate position.  IMPRESSION: Mild dilatation of the common bile duct and central intrahepatic ducts with placement of a biliary stent in adequate position. Recommend correlation with findings at the time of the procedure.  These images were submitted for radiologic interpretation only. Please see the procedural report for the amount of contrast and the fluoroscopy time utilized.   Electronically Signed   By: Marin Olp M.D.   On: 07/05/2014 18:53      Assessment & Plan  78 yo male with CAD,  s/p PCI 05/17/2014, diastolic CHF, with CBD stone and elevated LFTs, s/p ERCP x 2  1. Cholangitis/CBD obstruction secondary to stones -- what appeared to be post-ERCP pancreatitis after initial ERCP was more likely a blocked biliary stent with stone causing cholangitis and probable pancreatitis.   --Repeat ERCP yesterday with clearance of CBD of stones and replacement of new  biliary stent --Improving AST/ALT, alk phos.  Bili not yet declining, but I expect it will tomorrow  --Improving clinically --Continue abx --Will advance to full liquids, slowly advance as tolerated  2. CAD on plavix, diastolic CHF -- discussed with Dr. Skains, who will ask hospitalist service to take over.  No acute, active cardiac issues at present   Principal Problem:   Choledocholithiasis Active Problems:   Type II diabetes mellitus with complication -- CAD, AAA   Hypertension associated with diabetes   Hyperlipidemia LDL goal <70; statin intolerant   Chronic diastolic CHF (congestive heart failure)   CAD S/P CABG '95- several PCis since, last 05/17/14   History of ST elevation myocardial infarction (STEMI) of inferolateral wall (PTCA - 100% large lateral OM)   Chronic renal insufficiency, stage III (moderate)   Unstable angina pectoris   Coronary stent restenosis with uncertain cause: Required PCI for ISR of OM1 (Promus P 2.75 mm x 12 mm) & pRCA   Calculus of bile duct without mention of cholecystitis or obstruction   Pancreatitis, acute   Cholangitis     LOS: 9 days   PYRTLE, JAY M  07/06/2014, 9:16 AM    

## 2014-07-06 NOTE — Progress Notes (Signed)
[facility] PROGRESS NOTE   [name] KXF:[id number] DOB: March 21, 1936 DOA: 06/27/2014 PCP: Julian Haste, MD  HPI/Subjective: Feels much better, denies any nausea or vomiting or abdominal pain. Asked by cardiology to take over the care of [name]  Assessment/Plan: Principal Problem:   Cholangitis Active Problems:   Type II diabetes mellitus with complication -- CAD, AAA   Hypertension associated with diabetes   Hyperlipidemia LDL goal <70; statin intolerant   Chronic diastolic CHF (congestive heart failure)   CAD S/P CABG '95- several PCis since, last 05/17/14   History of ST elevation myocardial infarction (STEMI) of inferolateral wall (PTCA - 100% large lateral OM)   Chronic renal insufficiency, stage III (moderate)   Unstable angina pectoris   Coronary stent restenosis with uncertain cause: Required PCI for ISR of OM1 (Promus P 2.75 mm x 12 mm) & pRCA   Calculus of bile duct without mention of cholecystitis or obstruction   Pancreatitis, acute   Choledocholithiasis    Acute cholangitis -Patient admitted to the hospital with RUQ/chest pain after recent PCI. -Developed fever while in the hospital, also had transaminitis and jaundice. -MRCP done on 9/27 showed CBD stones. -Patient undergone ERCP and placement of biliary stent on 9/30 done by Dr. Deatra Ina. -After that patient fever was worsening, bilirubin also was worsening. -ERCP repeated on 10/2 with removal of old stent and placement of a new covered stent. -Patient is on vancomycin and Zosyn continued.  Acute pancreatitis -Probably post ERCP pancreatitis, lipase was >3000 on 10/1. -Lipase was 1873 on 10/2, abdominal pain improving, patient diet advanced to full liquids.  Sepsis -Fever of 102.8 with a respiratory rate of 30 and presence of acute cholangitis. -Blood cultures obtained and NGTD. Continue broad-spectrum antibiotics.  Type 2 diabetes mellitus -Patient is on pioglitazone and metformin  both on hold. -Insulin sliding scale, currently patient is on full liquid diet.3  CAD with recent PCI -Cardiac catheterization with placement of DES (on 8/14) for ISR in the mid obtuse marginal and the proximal RCA. -Continue Plavix.  CKD stage III  -Creatinine baseline about 1.3, currently elevated at 1.7, continue IV fluid hydration.  Code Status: Full code Family Communication: Plan discussed with the patient. Disposition Plan: Remains inpatient   Consultants:  Gastroenterology  Procedures:  None  Antibiotics:  Zosyn and vancomycin   Objective: Filed Vitals:   07/06/14 0926  BP: 146/65  Pulse: 66  Temp:   Resp:     Intake/Output Summary (Last 24 hours) at 07/06/14 1244 Last data filed at 07/06/14 1231  Gross per 24 hour  Intake   1160 ml  Output   1575 ml  Net   -415 ml   Filed Weights   07/04/14 1422 07/05/14 0500 07/06/14 0500  Weight: 92.262 kg (203 lb 6.4 oz) 93.26 kg (205 lb 9.6 oz) 93.94 kg (207 lb 1.6 oz)    Exam: General: Alert and awake, oriented x3, not in any acute distress. HEENT: anicteric sclera, pupils reactive to light and accommodation, EOMI CVS: S1-S2 clear, no murmur rubs or gallops Chest: clear to auscultation bilaterally, no wheezing, rales or rhonchi Abdomen: soft nontender, nondistended, normal bowel sounds, no organomegaly Extremities: no cyanosis, clubbing or edema noted bilaterally Neuro: Cranial nerves II-XII intact, no focal neurological deficits  Data Reviewed: Basic Metabolic Panel:  Recent Labs Lab 07/01/14 0409 07/04/14 0556 07/05/14 0500  NA 133* 136* 132*  K 4.4 4.7 4.7  CL 98 99 98  CO2 21 24 20   GLUCOSE 183* 229*  154*  BUN 19 27* 37*  CREATININE 1.30 1.45* 1.72*  CALCIUM 9.0 9.5 8.7   Liver Function Tests:  Recent Labs Lab 07/01/14 0409 07/02/14 0531 07/04/14 0556 07/05/14 0500 07/06/14 0535  AST 51* 65* 102* 68* 51*  ALT 63* 68* 100* 85* 66*  ALKPHOS 143* 173* 225* 204* 182*  BILITOT 2.6*  3.8* 4.7* 7.5* 8.3*  PROT 6.4 6.5 7.2 6.2 5.9*  ALBUMIN 2.5* 2.6* 2.8* 2.4* 2.1*    Recent Labs Lab 07/04/14 0556 07/05/14 0500  LIPASE >3000* 1873*  AMYLASE 1847*  --    No results found for this basename: AMMONIA,  in the last 168 hours CBC:  Recent Labs Lab 07/02/14 0531 07/03/14 0516 07/04/14 0556 07/05/14 0500 07/06/14 0535  WBC 5.4 4.1 11.4* 10.4 7.9  HGB 10.2* 10.2* 11.1* 9.6* 8.9*  HCT 29.8* 29.9* 33.4* 28.2* 26.2*  MCV 90.0 90.1 93.8 93.4 90.0  PLT 222 241 280 233 201   Cardiac Enzymes: No results found for this basename: CKTOTAL, CKMB, CKMBINDEX, TROPONINI,  in the last 168 hours BNP (last 3 results)  Recent Labs  06/27/14 1954 07/04/14 1300  PROBNP 333.0 397.2   CBG:  Recent Labs Lab 07/05/14 1159 07/05/14 1839 07/05/14 2121 07/06/14 0802 07/06/14 1212  GLUCAP 169* 150* 169* 146* 160*    Micro No results found for this or any previous visit (from the past 240 hour(s)).   Studies: Dg Chest 2 View  07/04/2014   CLINICAL DATA:  New productive cough, congestion, personal history of type 2 diabetes, hypertension, coronary artery disease post CABG, dyslipidemia, abdominal aortic aneurysm, initial encounter  EXAM: CHEST  2 VIEW  COMPARISON:  06/27/2014  FINDINGS: Normal heart size post median sternotomy by history CABG.  Mediastinal contours and pulmonary vascularity normal.  Atherosclerotic calcification.  Minimal elevation of LEFT diaphragm and LEFT basilar atelectasis.  No acute infiltrate, pleural effusion or pneumothorax.  Prior cervical spine fusion.  IMPRESSION: Minimal LEFT basilar atelectasis.   Electronically Signed   By: Lavonia Dana M.D.   On: 07/04/2014 14:41   Dg Ercp  07/05/2014   CLINICAL DATA:  Biliary stent obstruction. Removal of existing biliary stent and placement of new stent. Patient with pancreatitis, jaundice and fever prior to the procedure.  EXAM: ERCP  TECHNIQUE: Multiple spot images obtained with the fluoroscopic device and  submitted for interpretation post-procedure.  COMPARISON:  MRCP 06/30/2014  FINDINGS: Examination demonstrates evidence of the pre-existing biliary stent with subsequent cannulation of the common bile duct an opacification of the common bile duct and intrahepatic ducts. There is mild dilatation of the common bile duct and central intrahepatic ducts without definite focal mass, filling defect or stricture. Final image demonstrates a biliary stent which appears in adequate position.  IMPRESSION: Mild dilatation of the common bile duct and central intrahepatic ducts with placement of a biliary stent in adequate position. Recommend correlation with findings at the time of the procedure.  These images were submitted for radiologic interpretation only. Please see the procedural report for the amount of contrast and the fluoroscopy time utilized.   Electronically Signed   By: Marin Olp M.D.   On: 07/05/2014 18:53    Scheduled Meds: . amLODipine  5 mg Oral Daily  . aspirin EC  81 mg Oral Daily  . clopidogrel  75 mg Oral Daily  . ezetimibe  10 mg Oral Daily  . fenofibrate  160 mg Oral Daily  . guaiFENesin  1,200 mg Oral BID  . insulin aspart  0-15 Units Subcutaneous TID WC  . insulin aspart  0-5 Units Subcutaneous QHS  . isosorbide mononitrate  30 mg Oral Daily  . metoprolol succinate  25 mg Oral Daily  . pantoprazole  40 mg Oral Daily  . piperacillin-tazobactam (ZOSYN)  IV  3.375 g Intravenous 3 times per day  . vancomycin  750 mg Intravenous Q12H   Continuous Infusions: . sodium chloride 20 mL/hr at 07/05/14 2050  . sodium chloride 200 mL/hr (07/05/14 1522)       Time spent: 35 minutes    [facility]  [facility] Pager [phone] If 7PM-7AM, please contact night-coverage at [email or web address], password [facility] 07/06/2014, 12:44 PM  LOS: 9 days

## 2014-07-06 NOTE — Progress Notes (Signed)
    Subjective:  No chest pain. Feeling much better. Minimal cough  Objective:  Vital Signs in the last 24 hours: Temp:  [97.5 F (36.4 C)-100.1 F (37.8 C)] 97.7 F (36.5 C) (10/03 0500) Pulse Rate:  [59-87] 66 (10/03 0926) Resp:  [13-32] 18 (10/03 0500) BP: (111-187)/(46-76) 146/65 mmHg (10/03 0926) SpO2:  [94 %-99 %] 97 % (10/03 0500) Weight:  [207 lb 1.6 oz (93.94 kg)] 207 lb 1.6 oz (93.94 kg) (10/03 0500)  Intake/Output from previous day: 10/02 0701 - 10/03 0700 In: 800 [I.V.:800] Out: 1225 [Urine:1225]   Physical Exam: General: Well developed, well nourished, in no acute distress. Head:  Normocephalic and atraumatic. Icteric sclera Lungs: Clear to auscultation and percussion. Heart: Normal S1 and S2.  No murmur, rubs or gallops.  Abdomen: mild distention, positive bowel sounds. Extremities: No clubbing or cyanosis. No edema. Neurologic: Alert and oriented x 3.    Lab Results:  Recent Labs  07/05/14 0500 07/06/14 0535  WBC 10.4 7.9  HGB 9.6* 8.9*  PLT 233 201    Recent Labs  07/04/14 0556 07/05/14 0500  NA 136* 132*  K 4.7 4.7  CL 99 98  CO2 24 20  GLUCOSE 229* 154*  BUN 27* 37*  CREATININE 1.45* 1.72*   Hepatic Function Panel  Recent Labs  07/06/14 0535  PROT 5.9*  ALBUMIN 2.1*  AST 51*  ALT 66*  ALKPHOS 182*  BILITOT 8.3*  BILIDIR 7.4*  IBILI 0.9     Telemetry: no adverse rhythms Personally viewed.   Assessment/Plan:  Principal Problem:   Choledocholithiasis Active Problems:   Type II diabetes mellitus with complication -- CAD, AAA   Hypertension associated with diabetes   Hyperlipidemia LDL goal <70; statin intolerant   Chronic diastolic CHF (congestive heart failure)   CAD S/P CABG '95- several PCis since, last 05/17/14   History of ST elevation myocardial infarction (STEMI) of inferolateral wall (PTCA - 100% large lateral OM)   Chronic renal insufficiency, stage III (moderate)   Unstable angina pectoris   Coronary stent  restenosis with uncertain cause: Required PCI for ISR of OM1 (Promus P 2.75 mm x 12 mm) & pRCA   Calculus of bile duct without mention of cholecystitis or obstruction   Pancreatitis, acute   Cholangitis  -Dr. Tamala Julian note reviewed from yesterday -Spoke to Dr. Hilarie Fredrickson with GI -Will need further IV abx for cholangitis.  -Appreciate transfer of care/ attending to [facility]. Spoke to Dr. Hartford Poli this AM since there are no active cardiac issues.    [name], [place] 07/06/2014, 9:36 AM

## 2014-07-07 DIAGNOSIS — K859 Acute pancreatitis, unspecified: Secondary | ICD-10-CM

## 2014-07-07 LAB — COMPREHENSIVE METABOLIC PANEL
ALT: 58 U/L — ABNORMAL HIGH (ref 0–53)
ANION GAP: 15 (ref 5–15)
AST: 41 U/L — ABNORMAL HIGH (ref 0–37)
Albumin: 2.1 g/dL — ABNORMAL LOW (ref 3.5–5.2)
Alkaline Phosphatase: 179 U/L — ABNORMAL HIGH (ref 39–117)
BILIRUBIN TOTAL: 4.5 mg/dL — AB (ref 0.3–1.2)
BUN: 26 mg/dL — AB (ref 6–23)
CHLORIDE: 98 meq/L (ref 96–112)
CO2: 18 mEq/L — ABNORMAL LOW (ref 19–32)
CREATININE: 1.15 mg/dL (ref 0.50–1.35)
Calcium: 8.7 mg/dL (ref 8.4–10.5)
GFR calc Af Amer: 69 mL/min — ABNORMAL LOW (ref >90)
GFR calc non Af Amer: 60 mL/min — ABNORMAL LOW (ref >90)
GLUCOSE: 160 mg/dL — AB (ref 70–99)
Potassium: 4.8 mEq/L (ref 3.7–5.3)
Sodium: 131 mEq/L — ABNORMAL LOW (ref 137–147)
Total Protein: 6.4 g/dL (ref 6.0–8.3)

## 2014-07-07 LAB — CBC
HEMATOCRIT: 28.9 % — AB (ref 39.0–52.0)
HEMOGLOBIN: 10.1 g/dL — AB (ref 13.0–17.0)
MCH: 30.7 pg (ref 26.0–34.0)
MCHC: 34.9 g/dL (ref 30.0–36.0)
MCV: 87.8 fL (ref 78.0–100.0)
Platelets: 295 10*3/uL (ref 150–400)
RBC: 3.29 MIL/uL — AB (ref 4.22–5.81)
RDW: 14.1 % (ref 11.5–15.5)
WBC: 10.8 10*3/uL — AB (ref 4.0–10.5)

## 2014-07-07 LAB — GLUCOSE, CAPILLARY
Glucose-Capillary: 153 mg/dL — ABNORMAL HIGH (ref 70–99)
Glucose-Capillary: 140 mg/dL — ABNORMAL HIGH (ref 70–99)
Glucose-Capillary: 164 mg/dL — ABNORMAL HIGH (ref 70–99)
Glucose-Capillary: 134 mg/dL — ABNORMAL HIGH (ref 70–99)

## 2014-07-07 LAB — LIPASE, BLOOD: LIPASE: 281 U/L — AB (ref 11–59)

## 2014-07-07 LAB — VANCOMYCIN, TROUGH: Vancomycin Tr: 11.9 ug/mL (ref 10.0–20.0)

## 2014-07-07 MED ORDER — VANCOMYCIN HCL IN DEXTROSE 1-5 GM/200ML-% IV SOLN
1000.0000 mg | Freq: Two times a day (BID) | INTRAVENOUS | Status: DC
  Administered 2014-07-07 – 2014-07-09 (×4): 1000 mg via INTRAVENOUS
  Filled 2014-07-07 (×5): qty 200

## 2014-07-07 MED ORDER — OXYCODONE HCL 5 MG PO TABS: 5.0000 mg | ORAL_TABLET | Freq: Four times a day (QID) | ORAL | Status: DC | PRN

## 2014-07-07 MED ORDER — HYDROMORPHONE HCL 1 MG/ML IJ SOLN: 1.0000 mg | INTRAMUSCULAR | Status: DC | PRN

## 2014-07-07 MED ORDER — OXYCODONE HCL 5 MG PO TABS
5.0000 mg | ORAL_TABLET | Freq: Four times a day (QID) | ORAL | Status: DC | PRN
  Administered 2014-07-07 – 2014-07-11 (×9): 5 mg via ORAL
  Filled 2014-07-07 (×9): qty 1

## 2014-07-07 NOTE — Progress Notes (Signed)
ANTIBIOTIC CONSULT NOTE - FOLLOW UP  Pharmacy Consult for vancomycin/zosyn Indication: rule out sepsis  Allergies  Allergen Reactions  . Lisinopril Cough    Rxn: unknown  . Statins Other (See Comments)    Muscle ache  . Welchol [Colesevelam Hcl] Itching    Patient Measurements: Height: 5\' 9"  (175.3 cm) Weight: 210 lb 11.2 oz (95.573 kg) IBW/kg (Calculated) : 70.7  Vital Signs: Temp: 98.2 F (36.8 C) (10/04 0500) Temp Source: Oral (10/04 0500) BP: 144/80 mmHg (10/04 0500) Pulse Rate: 80 (10/04 0500) Intake/Output from previous day: 10/03 0701 - 10/04 0700 In: 720 [P.O.:720] Out: 1775 [Urine:1775] Intake/Output from this shift:    Labs:  Recent Labs  07/05/14 0500 07/06/14 0535 07/07/14 0245  WBC 10.4 7.9 10.8*  HGB 9.6* 8.9* 10.1*  PLT 233 201 295  CREATININE 1.72*  --  1.15   Estimated Creatinine Clearance: 62.4 ml/min (by C-G formula based on Cr of 1.15).  Recent Labs  07/07/14 0720  [place] 11.9     Microbiology: Recent Results (from the past 720 hour(s))  CULTURE, BLOOD (ROUTINE X 2)     Status: None   Collection Time    07/04/14  9:17 PM      Result Value Ref Range Status   Specimen Description BLOOD RIGHT ARM   Final   Special Requests BOTTLES DRAWN AEROBIC AND ANAEROBIC 10CC   Final   Culture  Setup Time     Final   Value: 07/05/2014 00:50     Performed at [facility]   Culture     Final   Value:        BLOOD CULTURE RECEIVED NO GROWTH TO DATE CULTURE WILL BE HELD FOR 5 DAYS BEFORE ISSUING A FINAL NEGATIVE REPORT     Performed at [facility]   Report Status PENDING   Incomplete  CULTURE, BLOOD (ROUTINE X 2)     Status: None   Collection Time    07/04/14  9:20 PM      Result Value Ref Range Status   Specimen Description BLOOD LEFT ARM   Final   Special Requests BOTTLES DRAWN AEROBIC AND ANAEROBIC 10CC EACH   Final   Culture  Setup Time     Final   Value: 07/05/2014 00:57     Performed at [facility]   Culture     Final   Value:        BLOOD CULTURE RECEIVED NO GROWTH TO DATE CULTURE WILL BE HELD FOR 5 DAYS BEFORE ISSUING A FINAL NEGATIVE REPORT     Performed at [facility]   Report Status PENDING   Incomplete    Anti-infectives   Start     Dose/Rate Route Frequency Ordered Stop   07/05/14 0800  vancomycin (VANCOCIN) IVPB 750 mg/150 ml premix     750 mg 150 mL/hr over 60 Minutes Intravenous Every 12 hours 07/04/14 2040     07/04/14 2100  vancomycin (VANCOCIN) IVPB 1000 mg/200 mL premix     1,000 mg 200 mL/hr over 60 Minutes Intravenous  Once 07/04/14 2040 07/04/14 2344   07/04/14 2045  piperacillin-tazobactam (ZOSYN) IVPB 3.375 g     3.375 g 12.5 mL/hr over 240 Minutes Intravenous 3 times per day 07/04/14 2040     07/03/14 1100  [MAR Hold]  Ampicillin-Sulbactam (UNASYN) 3 g in sodium chloride 0.9 % 100 mL IVPB     (On MAR Hold since 07/03/14 1014)   3 g 100 mL/hr over  60 Minutes Intravenous  Once 07/01/14 1545 07/03/14 1136   07/02/14 1600  Ampicillin-Sulbactam (UNASYN) 3 g in sodium chloride 0.9 % 100 mL IVPB  Status:  Discontinued     3 g 100 mL/hr over 60 Minutes Intravenous  Once 07/01/14 1544 07/01/14 1545      Assessment: 78 yo m admitted on 9/24 and is s/p ERCP on 9/30.  Patient developed fever and pharmacy was consulted to dose and adjust vancomycin and zosyn for possible sepsis.  Patient had acute renal insufficiency with a SCr of 1.45 on 10/1.  SCr continued to trend up to 1.72 yesterday and patient received an extra dose of his vancomycin on 10/2 (?).  Level checked this AM was below goal (goal 15-20) at 11.9 on 750 mg IV q12hr.  Will increase vancomycin to 1,000 mg IV q12hr for a estimated trough of 15.8.  Continue on current zosyn dose.  Wbc 10.8, tmax 98.5, SCr today 1.15.  No growth on blood cultures.  10/1 Bld x2: ngtd  10/1 Vanco>>  10/1 Zosyn>>  Goal of Therapy:  Vancomycin trough level 15-20 mcg/ml  Plan:  Increase vancomycin dose to 1,000 mg IV  q12hr Continue zosyn 3.375 gm IV q8hr (4-hr infusion) De-escalate antibiotics as soon as appropriate Monitor CBC, temperature curve, cultures, renal function, clinical course  [name], PharmD Clinical Pharmacy Resident Pager: [phone] 07/07/2014 9:12 AM

## 2014-07-07 NOTE — Progress Notes (Signed)
Progress Note   Subjective  Still with epigastric abd pain requiring IV pain meds Feels hungry, had grits and OJ for breakfast without increased pain Small BM, nonbloody, feels constipated and wants to try prune juice rather than laxative    Objective  Vital signs in last 24 hours: Temp:  [97.8 F (36.6 C)-98.5 F (36.9 C)] 98.2 F (36.8 C) (10/04 0500) Pulse Rate:  [59-80] 80 (10/04 0500) Resp:  [16-18] 18 (10/04 0500) BP: (139-163)/(64-80) 163/71 mmHg (10/04 0932) SpO2:  [96 %-100 %] 99 % (10/04 0500) Weight:  [210 lb 11.2 oz (95.573 kg)] 210 lb 11.2 oz (95.573 kg) (10/04 0500) Last BM Date: 07/06/14 Gen: awake, alert, NAD HEENT: mild icterus, op clear CV: RRR Pulm: decreased b/l bases Abd: soft, epigastric tenderness without rebound or guarding, hypoactive but present BS  Ext: no c/c, trace pretib edema Neuro: nonfocal  Intake/Output from previous day: 10/03 0701 - 10/04 0700 In: 720 [P.O.:720] Out: 1775 [Urine:1775] Intake/Output this shift: Total I/O In: -  Out: 300 [Urine:300]  Lab Results:  Recent Labs  07/05/14 0500 07/06/14 0535 07/07/14 0245  WBC 10.4 7.9 10.8*  HGB 9.6* 8.9* 10.1*  HCT 28.2* 26.2* 28.9*  PLT 233 201 295   BMET  Recent Labs  07/05/14 0500 07/07/14 0245  NA 132* 131*  K 4.7 4.8  CL 98 98  CO2 20 18*  GLUCOSE 154* 160*  BUN 37* 26*  CREATININE 1.72* 1.15  CALCIUM 8.7 8.7   LFT  Recent Labs  07/06/14 0535 07/07/14 0245  PROT 5.9* 6.4  ALBUMIN 2.1* 2.1*  AST 51* 41*  ALT 66* 58*  ALKPHOS 182* 179*  BILITOT 8.3* 4.5*  BILIDIR 7.4*  --   IBILI 0.9  --    Studies/Results: Dg Ercp  07/05/2014   CLINICAL DATA:  Biliary stent obstruction. Removal of existing biliary stent and placement of new stent. Patient with pancreatitis, jaundice and fever prior to the procedure.  EXAM: ERCP  TECHNIQUE: Multiple spot images obtained with the fluoroscopic device and submitted for interpretation post-procedure.  COMPARISON:   MRCP 06/30/2014  FINDINGS: Examination demonstrates evidence of the pre-existing biliary stent with subsequent cannulation of the common bile duct an opacification of the common bile duct and intrahepatic ducts. There is mild dilatation of the common bile duct and central intrahepatic ducts without definite focal mass, filling defect or stricture. Final image demonstrates a biliary stent which appears in adequate position.  IMPRESSION: Mild dilatation of the common bile duct and central intrahepatic ducts with placement of a biliary stent in adequate position. Recommend correlation with findings at the time of the procedure.  These images were submitted for radiologic interpretation only. Please see the procedural report for the amount of contrast and the fluoroscopy time utilized.   Electronically Signed   By: Marin Olp M.D.   On: 07/05/2014 18:53      Assessment & Plan  78 yo male with CAD, s/p PCI 2/44/0102, diastolic CHF, with CBD stone and elevated LFTs, s/p ERCP x 2   1. Cholangitis/CBD obstruction secondary to stones/pancreatitis (gallstone vs. ERCP, felt the former more likely) -- slow improvement clinically, large improvement in LFTs today.  Tolerating current diet, but pain still significant requiring IV pain control --continue hydration, patient encouraged to push fluids --monitor LFTs --continue ABX given cholangitis, but now bile ducts are draining, expect improvement --OOB as much as possible  2. Constipation -- trial of prune juice rather than laxative today (pt preference), OOB  3. CAD, PCI -- on plavix, cardiology signed off     Principal Problem:   Cholangitis Active Problems:   Type II diabetes mellitus with complication -- CAD, AAA   Hypertension associated with diabetes   Hyperlipidemia LDL goal <70; statin intolerant   Chronic diastolic CHF (congestive heart failure)   CAD S/P CABG '95- several PCis since, last 05/17/14   History of ST elevation myocardial  infarction (STEMI) of inferolateral wall (PTCA - 100% large lateral OM)   Chronic renal insufficiency, stage III (moderate)   Unstable angina pectoris   Coronary stent restenosis with uncertain cause: Required PCI for ISR of OM1 (Promus P 2.75 mm x 12 mm) & pRCA   Calculus of bile duct without mention of cholecystitis or obstruction   Pancreatitis, acute   Choledocholithiasis     LOS: 10 days   Ysmael Hires M  07/07/2014, 12:00 PM

## 2014-07-07 NOTE — Progress Notes (Signed)
[facility] PROGRESS NOTE   [name] LKG:[id number] DOB: 26-Jun-1936 DOA: 06/27/2014 PCP: [name], MD  HPI/Subjective: Still with abdominal pain, mid epigastric.  Vomited last night small amount.  Had BM.   Assessment/Plan: Principal Problem:   Cholangitis Active Problems:   Type II diabetes mellitus with complication -- CAD, AAA   Hypertension associated with diabetes   Hyperlipidemia LDL goal <70; statin intolerant   Chronic diastolic CHF (congestive heart failure)   CAD S/P CABG '95- several PCis since, last 05/17/14   History of ST elevation myocardial infarction (STEMI) of inferolateral wall (PTCA - 100% large lateral OM)   Chronic renal insufficiency, stage III (moderate)   Unstable angina pectoris   Coronary stent restenosis with uncertain cause: Required PCI for ISR of OM1 (Promus P 2.75 mm x 12 mm) & pRCA   Calculus of bile duct without mention of cholecystitis or obstruction   Pancreatitis, acute   Choledocholithiasis    Acute cholangitis -Patient admitted to the hospital with RUQ/chest pain after recent PCI. -Developed fever while in the hospital, also had transaminitis and jaundice. -MRCP done on 9/27 showed CBD stones. -Patient undergone ERCP and placement of biliary stent on 9/30 done by Dr. Deatra Ina. -After that patient fever was worsening, bilirubin also was worsening. -ERCP repeated on 10/2 with removal of old stent and placement of a new covered stent. -Patient is on vancomycin day 1,  and Zosyn day 4.   Acute pancreatitis -Probably post ERCP pancreatitis, lipase was >3000 on 10/1. -Lipase was 1873 on 10/2, abdominal pain improving. -Continue with liquid diet.  -Lipase decrease to 281.  Sepsis -Fever of 102.8 with a respiratory rate of 30 and presence of acute cholangitis. -Blood cultures obtained and NGTD. Continue broad-spectrum antibiotics.  Type 2 diabetes mellitus -Patient is on pioglitazone and metformin both on  hold. -Insulin sliding scale, currently patient is on full liquid diet.3  CAD with recent PCI -Cardiac catheterization with placement of DES (on 8/14) for ISR in the mid obtuse marginal and the proximal RCA. -Continue Plavix.  CKD stage III  -Creatinine baseline about 1.3, currently elevated at 1.7, continue IV fluid hydration. -Improving with IV fluids. Cr decrease to 1.1.  Code Status: Full code Family Communication: Plan discussed with the patient. Disposition Plan: Remains inpatient. PT consult.    Consultants:  Gastroenterology  Procedures:  None  Antibiotics:  Zosyn and vancomycin   Objective: Filed Vitals:   07/07/14 0500  BP: 144/80  Pulse: 80  Temp: 98.2 F (36.8 C)  Resp: 18    Intake/Output Summary (Last 24 hours) at 07/07/14 0844 Last data filed at 07/07/14 0017  Gross per 24 hour  Intake    720 ml  Output   1450 ml  Net   -730 ml   Filed Weights   07/05/14 0500 07/06/14 0500 07/07/14 0500  Weight: 93.26 kg (205 lb 9.6 oz) 93.94 kg (207 lb 1.6 oz) 95.573 kg (210 lb 11.2 oz)    Exam: General: Alert and awake, oriented x3, not in any acute distress. HEENT: anicteric sclera, pupils reactive to light and accommodation, EOMI CVS: S1-S2 clear, no murmur rubs or gallops Chest: clear to auscultation bilaterally, no wheezing, rales or rhonchi Abdomen: soft nontender, nondistended, normal bowel sounds, no organomegaly Extremities: no cyanosis, clubbing or edema noted bilaterally   Data Reviewed: Basic Metabolic Panel:  Recent Labs Lab 07/01/14 0409 07/04/14 0556 07/05/14 0500 07/07/14 0245  NA 133* 136* 132* 131*  K 4.4 4.7 4.7 4.8  CL 98 99 98 98  CO2 21 24 20  18*  GLUCOSE 183* 229* 154* 160*  BUN 19 27* 37* 26*  CREATININE 1.30 1.45* 1.72* 1.15  CALCIUM 9.0 9.5 8.7 8.7   Liver Function Tests:  Recent Labs Lab 07/02/14 0531 07/04/14 0556 07/05/14 0500 07/06/14 0535 07/07/14 0245  AST 65* 102* 68* 51* 41*  ALT 68* 100* 85* 66*  58*  ALKPHOS 173* 225* 204* 182* 179*  BILITOT 3.8* 4.7* 7.5* 8.3* 4.5*  PROT 6.5 7.2 6.2 5.9* 6.4  ALBUMIN 2.6* 2.8* 2.4* 2.1* 2.1*    Recent Labs Lab 07/04/14 0556 07/05/14 0500 07/07/14 0245  LIPASE >3000* 1873* 281*  AMYLASE 1847*  --   --    No results found for this basename: AMMONIA,  in the last 168 hours CBC:  Recent Labs Lab 07/03/14 0516 07/04/14 0556 07/05/14 0500 07/06/14 0535 07/07/14 0245  WBC 4.1 11.4* 10.4 7.9 10.8*  HGB 10.2* 11.1* 9.6* 8.9* 10.1*  HCT 29.9* 33.4* 28.2* 26.2* 28.9*  MCV 90.1 93.8 93.4 90.0 87.8  PLT 241 280 233 201 295   Cardiac Enzymes: No results found for this basename: CKTOTAL, CKMB, CKMBINDEX, TROPONINI,  in the last 168 hours BNP (last 3 results)  Recent Labs  06/27/14 1954 07/04/14 1300  PROBNP 333.0 397.2   CBG:  Recent Labs Lab 07/05/14 2121 07/06/14 0802 07/06/14 1212 07/06/14 1729 07/06/14 2047  GLUCAP 169* 146* 160* 195* 156*    Micro Recent Results (from the past 240 hour(s))  CULTURE, BLOOD (ROUTINE X 2)     Status: None   Collection Time    07/04/14  9:17 PM      Result Value Ref Range Status   Specimen Description BLOOD RIGHT ARM   Final   Special Requests BOTTLES DRAWN AEROBIC AND ANAEROBIC 10CC   Final   Culture  Setup Time     Final   Value: 07/05/2014 00:50     Performed at [facility]   Culture     Final   Value:        BLOOD CULTURE RECEIVED NO GROWTH TO DATE CULTURE WILL BE HELD FOR 5 DAYS BEFORE ISSUING A FINAL NEGATIVE REPORT     Performed at [facility]   Report Status PENDING   Incomplete  CULTURE, BLOOD (ROUTINE X 2)     Status: None   Collection Time    07/04/14  9:20 PM      Result Value Ref Range Status   Specimen Description BLOOD LEFT ARM   Final   Special Requests BOTTLES DRAWN AEROBIC AND ANAEROBIC 10CC EACH   Final   Culture  Setup Time     Final   Value: 07/05/2014 00:57     Performed at [facility]   Culture     Final   Value:        BLOOD  CULTURE RECEIVED NO GROWTH TO DATE CULTURE WILL BE HELD FOR 5 DAYS BEFORE ISSUING A FINAL NEGATIVE REPORT     Performed at [facility]   Report Status PENDING   Incomplete     Studies: Dg Ercp  07/05/2014   CLINICAL DATA:  Biliary stent obstruction. Removal of existing biliary stent and placement of new stent. Patient with pancreatitis, jaundice and fever prior to the procedure.  EXAM: ERCP  TECHNIQUE: Multiple spot images obtained with the fluoroscopic device and submitted for interpretation post-procedure.  COMPARISON:  MRCP 06/30/2014  FINDINGS: Examination demonstrates evidence of the  pre-existing biliary stent with subsequent cannulation of the common bile duct an opacification of the common bile duct and intrahepatic ducts. There is mild dilatation of the common bile duct and central intrahepatic ducts without definite focal mass, filling defect or stricture. Final image demonstrates a biliary stent which appears in adequate position.  IMPRESSION: Mild dilatation of the common bile duct and central intrahepatic ducts with placement of a biliary stent in adequate position. Recommend correlation with findings at the time of the procedure.  These images were submitted for radiologic interpretation only. Please see the procedural report for the amount of contrast and the fluoroscopy time utilized.   Electronically Signed   By: Marin Olp M.D.   On: 07/05/2014 18:53    Scheduled Meds: . amLODipine  5 mg Oral Daily  . aspirin EC  81 mg Oral Daily  . clopidogrel  75 mg Oral Daily  . ezetimibe  10 mg Oral Daily  . fenofibrate  160 mg Oral Daily  . guaiFENesin  1,200 mg Oral BID  . insulin aspart  0-15 Units Subcutaneous TID WC  . isosorbide mononitrate  30 mg Oral Daily  . metoprolol succinate  25 mg Oral Daily  . pantoprazole  40 mg Oral Daily  . piperacillin-tazobactam (ZOSYN)  IV  3.375 g Intravenous 3 times per day  . vancomycin  750 mg Intravenous Q12H   Continuous  Infusions: . sodium chloride 75 mL (07/06/14 1416)       Time spent: 25 minutes    Regalado, Belkys A  [facility] Pager [phone] If 7PM-7AM, please contact night-coverage at [email or web address], password [facility] 07/07/2014, 8:44 AM  LOS: 10 days

## 2014-07-08 ENCOUNTER — Encounter (HOSPITAL_COMMUNITY): Payer: Self-pay | Admitting: Gastroenterology

## 2014-07-08 LAB — GLUCOSE, CAPILLARY
GLUCOSE-CAPILLARY: 191 mg/dL — AB (ref 70–99)
GLUCOSE-CAPILLARY: 206 mg/dL — AB (ref 70–99)
Glucose-Capillary: 186 mg/dL — ABNORMAL HIGH (ref 70–99)
Glucose-Capillary: 216 mg/dL — ABNORMAL HIGH (ref 70–99)

## 2014-07-08 LAB — BASIC METABOLIC PANEL
Anion gap: 13 (ref 5–15)
BUN: 15 mg/dL (ref 6–23)
CALCIUM: 9.1 mg/dL (ref 8.4–10.5)
CO2: 22 meq/L (ref 19–32)
CREATININE: 1.11 mg/dL (ref 0.50–1.35)
Chloride: 96 mEq/L (ref 96–112)
GFR calc Af Amer: 73 mL/min — ABNORMAL LOW (ref >90)
GFR calc non Af Amer: 63 mL/min — ABNORMAL LOW (ref >90)
GLUCOSE: 181 mg/dL — AB (ref 70–99)
Potassium: 3.9 mEq/L (ref 3.7–5.3)
Sodium: 131 mEq/L — ABNORMAL LOW (ref 137–147)

## 2014-07-08 LAB — CBC
HEMATOCRIT: 28.9 % — AB (ref 39.0–52.0)
HEMOGLOBIN: 10.1 g/dL — AB (ref 13.0–17.0)
MCH: 30.7 pg (ref 26.0–34.0)
MCHC: 34.9 g/dL (ref 30.0–36.0)
MCV: 87.8 fL (ref 78.0–100.0)
Platelets: 334 10*3/uL (ref 150–400)
RBC: 3.29 MIL/uL — ABNORMAL LOW (ref 4.22–5.81)
RDW: 13.9 % (ref 11.5–15.5)
WBC: 15.1 10*3/uL — ABNORMAL HIGH (ref 4.0–10.5)

## 2014-07-08 LAB — HEPATIC FUNCTION PANEL
ALK PHOS: 158 U/L — AB (ref 39–117)
ALT: 45 U/L (ref 0–53)
AST: 27 U/L (ref 0–37)
Albumin: 2.2 g/dL — ABNORMAL LOW (ref 3.5–5.2)
BILIRUBIN DIRECT: 2.2 mg/dL — AB (ref 0.0–0.3)
Indirect Bilirubin: 1.1 mg/dL — ABNORMAL HIGH (ref 0.3–0.9)
Total Bilirubin: 3.3 mg/dL — ABNORMAL HIGH (ref 0.3–1.2)
Total Protein: 6.7 g/dL (ref 6.0–8.3)

## 2014-07-08 MED ORDER — POLYETHYLENE GLYCOL 3350 17 G PO PACK
17.0000 g | PACK | Freq: Every day | ORAL | Status: DC
  Administered 2014-07-08 – 2014-07-09 (×2): 17 g via ORAL
  Filled 2014-07-08 (×3): qty 1

## 2014-07-08 NOTE — Progress Notes (Signed)
Medicare Important Message given?  YES (If response is "NO", the following Medicare IM given date fields will be blank) Date Medicare IM given:  07/08/2014 Medicare IM given by:  Devita Nies  

## 2014-07-08 NOTE — Progress Notes (Signed)
[facility] PROGRESS NOTE   SEHAJ MCENROE DUK:[id number] DOB: Sep 19, 1936 DOA: 06/27/2014 PCP: [name], MD  HPI/Subjective: Abdominal 5/10, worse at time with meals.    Assessment/Plan: Principal Problem:   Cholangitis Active Problems:   Type II diabetes mellitus with complication -- CAD, AAA   Hypertension associated with diabetes   Hyperlipidemia LDL goal <70; statin intolerant   Chronic diastolic CHF (congestive heart failure)   CAD S/P CABG '95- several PCis since, last 05/17/14   History of ST elevation myocardial infarction (STEMI) of inferolateral wall (PTCA - 100% large lateral OM)   Chronic renal insufficiency, stage III (moderate)   Unstable angina pectoris   Coronary stent restenosis with uncertain cause: Required PCI for ISR of OM1 (Promus P 2.75 mm x 12 mm) & pRCA   Calculus of bile duct without mention of cholecystitis or obstruction   Pancreatitis, acute   Choledocholithiasis    Acute cholangitis -Patient admitted to the hospital with RUQ/chest pain after recent PCI. -Developed fever while in the hospital, also had transaminitis and jaundice. -MRCP done on 9/27 showed CBD stones. -Patient undergone ERCP and placement of biliary stent on 9/30 done by Dr. Deatra Ina. -After that patient fever was worsening, bilirubin also was worsening. -ERCP repeated on 10/2 with removal of old stent and placement of a new covered stent. -Patient is on vancomycin day 2,  and Zosyn day 5.  -WBC increase today at 15. If continue to increase might need CT abdomen.  -LFT trending down.   Acute pancreatitis -Probably post ERCP pancreatitis, lipase was >3000 on 10/1. -Lipase was 1873 on 10/2, abdominal pain improving. -Continue with liquid diet.  -Lipase decrease to 281.  Sepsis -Fever of 102.8 with a respiratory rate of 30 and presence of acute cholangitis. -Blood cultures obtained and NGTD. Continue broad-spectrum antibiotics.  Type 2 diabetes  mellitus -Patient is on pioglitazone and metformin both on hold. -Insulin sliding scale, currently patient is on full liquid diet.3  CAD with recent PCI -Cardiac catheterization with placement of DES (on 8/14) for ISR in the mid obtuse marginal and the proximal RCA. -Continue Plavix.  CKD stage III  -Creatinine baseline about 1.3, currently elevated at 1.7, continue IV fluid hydration. -Improving with IV fluids. Cr decrease to 1.1.  Code Status: Full code Family Communication: Plan discussed with the patient. Disposition Plan: Remains inpatient. PT consult.    Consultants:  Gastroenterology  Procedures:  None  Antibiotics:  Zosyn and vancomycin   Objective: Filed Vitals:   07/08/14 1059  BP:   Pulse: 80  Temp:   Resp:     Intake/Output Summary (Last 24 hours) at 07/08/14 1114 Last data filed at 07/08/14 1101  Gross per 24 hour  Intake    240 ml  Output   1375 ml  Net  -1135 ml   Filed Weights   07/06/14 0500 07/07/14 0500 07/08/14 0515  Weight: 93.94 kg (207 lb 1.6 oz) 95.573 kg (210 lb 11.2 oz) 95.5 kg (210 lb 8.6 oz)    Exam: General: Alert and awake, oriented x3, not in any acute distress. HEENT: anicteric sclera, pupils reactive to light and accommodation, EOMI CVS: S1-S2 clear, no murmur rubs or gallops Chest: clear to auscultation bilaterally, no wheezing, rales or rhonchi Abdomen: soft nontender, nondistended, normal bowel sounds, no organomegaly Extremities: no cyanosis, clubbing or edema noted bilaterally   Data Reviewed: Basic Metabolic Panel:  Recent Labs Lab 07/04/14 0556 07/05/14 0500 07/07/14 0245 07/08/14 0415  NA 136* 132* 131* 131*  K 4.7 4.7 4.8 3.9  CL 99 98 98 96  CO2 24 20 18* 22  GLUCOSE 229* 154* 160* 181*  BUN 27* 37* 26* 15  CREATININE 1.45* 1.72* 1.15 1.11  CALCIUM 9.5 8.7 8.7 9.1   Liver Function Tests:  Recent Labs Lab 07/04/14 0556 07/05/14 0500 07/06/14 0535 07/07/14 0245 07/08/14 0415  AST 102* 68*  51* 41* 27  ALT 100* 85* 66* 58* 45  ALKPHOS 225* 204* 182* 179* 158*  BILITOT 4.7* 7.5* 8.3* 4.5* 3.3*  PROT 7.2 6.2 5.9* 6.4 6.7  ALBUMIN 2.8* 2.4* 2.1* 2.1* 2.2*    Recent Labs Lab 07/04/14 0556 07/05/14 0500 07/07/14 0245  LIPASE >3000* 1873* 281*  AMYLASE 1847*  --   --    No results found for this basename: AMMONIA,  in the last 168 hours CBC:  Recent Labs Lab 07/04/14 0556 07/05/14 0500 07/06/14 0535 07/07/14 0245 07/08/14 0415  WBC 11.4* 10.4 7.9 10.8* 15.1*  HGB 11.1* 9.6* 8.9* 10.1* 10.1*  HCT 33.4* 28.2* 26.2* 28.9* 28.9*  MCV 93.8 93.4 90.0 87.8 87.8  PLT 280 233 201 295 334   Cardiac Enzymes: No results found for this basename: CKTOTAL, CKMB, CKMBINDEX, TROPONINI,  in the last 168 hours BNP (last 3 results)  Recent Labs  06/27/14 1954 07/04/14 1300  PROBNP 333.0 397.2   CBG:  Recent Labs Lab 07/07/14 0743 07/07/14 1140 07/07/14 1653 07/07/14 2105 07/08/14 0745  GLUCAP 153* 140* 164* 134* 186*    Micro Recent Results (from the past 240 hour(s))  CULTURE, BLOOD (ROUTINE X 2)     Status: None   Collection Time    07/04/14  9:17 PM      Result Value Ref Range Status   Specimen Description BLOOD RIGHT ARM   Final   Special Requests BOTTLES DRAWN AEROBIC AND ANAEROBIC 10CC   Final   Culture  Setup Time     Final   Value: 07/05/2014 00:50     Performed at [facility]   Culture     Final   Value:        BLOOD CULTURE RECEIVED NO GROWTH TO DATE CULTURE WILL BE HELD FOR 5 DAYS BEFORE ISSUING A FINAL NEGATIVE REPORT     Performed at [facility]   Report Status PENDING   Incomplete  CULTURE, BLOOD (ROUTINE X 2)     Status: None   Collection Time    07/04/14  9:20 PM      Result Value Ref Range Status   Specimen Description BLOOD LEFT ARM   Final   Special Requests BOTTLES DRAWN AEROBIC AND ANAEROBIC 10CC EACH   Final   Culture  Setup Time     Final   Value: 07/05/2014 00:57     Performed at [facility]   Culture      Final   Value:        BLOOD CULTURE RECEIVED NO GROWTH TO DATE CULTURE WILL BE HELD FOR 5 DAYS BEFORE ISSUING A FINAL NEGATIVE REPORT     Performed at [facility]   Report Status PENDING   Incomplete     Studies: No results found.  Scheduled Meds: . amLODipine  5 mg Oral Daily  . aspirin EC  81 mg Oral Daily  . clopidogrel  75 mg Oral Daily  . ezetimibe  10 mg Oral Daily  . fenofibrate  160 mg Oral Daily  . guaiFENesin  1,200 mg Oral BID  .  insulin aspart  0-15 Units Subcutaneous TID WC  . isosorbide mononitrate  30 mg Oral Daily  . metoprolol succinate  25 mg Oral Daily  . pantoprazole  40 mg Oral Daily  . piperacillin-tazobactam (ZOSYN)  IV  3.375 g Intravenous 3 times per day  . vancomycin  1,000 mg Intravenous Q12H   Continuous Infusions: . sodium chloride 75 mL (07/06/14 1416)       Time spent: 25 minutes    Cyana Shook A  [facility] Pager [phone] If 7PM-7AM, please contact night-coverage at [email or web address], password [facility] 07/08/2014, 11:14 AM  LOS: 11 days

## 2014-07-08 NOTE — Progress Notes (Signed)
Daily Rounding Note  07/08/2014, 10:30 AM  LOS: 11 days   SUBJECTIVE:       Pain in upper abdomen improved but persists. Using just one 5 mg Oxycodone per day for last few days. No nausea.  On full liquids.  No sweats or chills.  Coughing up clear sputum.   OBJECTIVE:         Vital signs in last 24 hours:    Temp:  [97.8 F (36.6 C)-98.4 F (36.9 C)] 98.4 F (36.9 C) (10/05 0515) Pulse Rate:  [65-75] 75 (10/05 0515) Resp:  [16-18] 18 (10/05 0515) BP: (135-159)/(53-73) 159/73 mmHg (10/05 0515) SpO2:  [97 %-100 %] 98 % (10/05 0515) Weight:  [95.5 kg (210 lb 8.6 oz)] 95.5 kg (210 lb 8.6 oz) (10/05 0515) Last BM Date: 07/07/14 Filed Weights   07/06/14 0500 07/07/14 0500 07/08/14 0515  Weight: 93.94 kg (207 lb 1.6 oz) 95.573 kg (210 lb 11.2 oz) 95.5 kg (210 lb 8.6 oz)   General: looks slightly ill, comfortable   Heart: RRR Chest: clear bil.  No MRG Abdomen: soft, ND, minimal tenderness in upper abdomen/LUQ.  Active BS  Extremities: no CCE Neuro/Psych:  Pleasant, cooperative, relaxed.  Oriented x 3, alert, no tremor or limb weakness.   Intake/Output from previous day: 10/04 0701 - 10/05 0700 In: -  Out: 800 [Urine:800]  Intake/Output this shift: Total I/O In: 240 [P.O.:240] Out: 475 [Urine:475]  Lab Results:  Recent Labs  07/06/14 0535 07/07/14 0245 07/08/14 0415  WBC 7.9 10.8* 15.1*  HGB 8.9* 10.1* 10.1*  HCT 26.2* 28.9* 28.9*  PLT 201 295 334   BMET  Recent Labs  07/07/14 0245 07/08/14 0415  NA 131* 131*  K 4.8 3.9  CL 98 96  CO2 18* 22  GLUCOSE 160* 181*  BUN 26* 15  CREATININE 1.15 1.11  CALCIUM 8.7 9.1   LFT  Recent Labs  07/06/14 0535 07/07/14 0245 07/08/14 0415  PROT 5.9* 6.4 6.7  ALBUMIN 2.1* 2.1* 2.2*  AST 51* 41* 27  ALT 66* 58* 45  ALKPHOS 182* 179* 158*  BILITOT 8.3* 4.5* 3.3*  BILIDIR 7.4*  --  2.2*  IBILI 0.9  --  1.1*   Scheduled Meds: . amLODipine  5 mg Oral  Daily  . aspirin EC  81 mg Oral Daily  . clopidogrel  75 mg Oral Daily  . ezetimibe  10 mg Oral Daily  . fenofibrate  160 mg Oral Daily  . guaiFENesin  1,200 mg Oral BID  . insulin aspart  0-15 Units Subcutaneous TID WC  . isosorbide mononitrate  30 mg Oral Daily  . metoprolol succinate  25 mg Oral Daily  . pantoprazole  40 mg Oral Daily  . piperacillin-tazobactam (ZOSYN)  IV  3.375 g Intravenous 3 times per day  . vancomycin  1,000 mg Intravenous Q12H   Continuous Infusions: . sodium chloride 75 mL (07/06/14 1416)   PRN Meds:.acetaminophen, HYDROmorphone (DILAUDID) injection, nitroGLYCERIN, ondansetron (ZOFRAN) IV, oxyCODONE   ASSESMENT:   * Choledocholithiasis. Cholelithiasis. No surgical option a this time due to Plavix requirement.  9/30 ERCP #1 with metal stent placement.  Developed pancreatitis, cholangitis, stone occlusion of stent post op 10/2 ERCP # 2:  Stent replaced, bile duct swept of stones and pus. Day 5 Vanco and Zosyn.  LFTs and Lipase all trended down but WBCs have risen.  No fever.   * Unstable angina.  Ruled out for MI.  DES  placed 05/17/14 and committed to one year of Plavix.   * CHF, diastolic, chronic.   *  DM2.  No meds at home, currently on SSI.    PLAN   *  Advance to [facility] diet.  CMET and CBC in AM.  Continue  Abx.     [name]  07/08/2014, 10:30 AM Pager: [phone]     Attending physician's note   I have taken an interval history, reviewed the chart and examined the patient. I agree with the Advanced Practitioner's note, impression and recommendations. He had some epigastric pain after eating heart healthy diet today. Will need to return to full liquids if post prandial pain persists. Overall he continues to improve. WBC slightly increased but all other parameters are improving. Monitor CMET and CBC. Continue IV antibiotics for another 1-2 days. Consider discharge in 1-2 days on PO antibiotics if he continues to improve.    Pricilla Riffle. Fuller Plan,  MD [facility]

## 2014-07-08 NOTE — Anesthesia Postprocedure Evaluation (Signed)
Anesthesia Post Note  Patient: [name]  Procedure(s) Performed: Procedure(s) (LRB): ENDOSCOPIC RETROGRADE CHOLANGIOPANCREATOGRAPHY (ERCP) (N/A)  Anesthesia type: General  Patient location: PACU  Post pain: Pain level controlled and Adequate analgesia  Post assessment: Post-op Vital signs reviewed, Patient's Cardiovascular Status Stable, Respiratory Function Stable, Patent Airway and Pain level controlled  Last Vitals:  Filed Vitals:   07/08/14 0515  BP: 159/73  Pulse: 75  Temp: 36.9 C  Resp: 18    Post vital signs: Reviewed and stable  Level of consciousness: awake, alert  and oriented  Complications: No apparent anesthesia complications

## 2014-07-08 NOTE — Progress Notes (Signed)
Utilization review complete. Peja Allender RN CCM Case Mgmt  

## 2014-07-08 NOTE — Evaluation (Signed)
Physical Therapy Evaluation Patient Details Name: [name] MRN: [id number] DOB: 10/04/1936 Today's Date: 07/08/2014   History of Present Illness  Patient admitted to the hospital with RUQ/chest pain after recent PCI. Developed fever while in the hospital, also had transaminitis and jaundice. MRCP done on 9/27 showed CBD stones. Patient undergone ERCP and placement of biliary stent on 9/30. ERCP repeated on 10/2 with removal of old stent and placement of a new covered stent.  Clinical Impression  Pt moving well but with balance deficits noted with gait. Pt has DME at home but has been refusing to use it and will need further assessment acutely for LRAD. Recommend cane use initially as well as additional acute therapy to further balance training to maximize function, stability gait and independence.     Follow Up Recommendations Outpatient PT    Equipment Recommendations  None recommended by PT    Recommendations for Other Services       Precautions / Restrictions Precautions Precautions: Fall      Mobility  Bed Mobility Overal bed mobility: Modified Independent                Transfers Overall transfer level: Modified independent                  Ambulation/Gait Ambulation/Gait assistance: Min guard Ambulation Distance (Feet): 300 Feet Assistive device: None Gait Pattern/deviations: Step-through pattern;Decreased stride length   Gait velocity interpretation: Below normal speed for age/gender General Gait Details: pt maintaining LLE knee flexion throughout gait with slightly crouched posture and LOB x 2 during gait with pt reaching for environmental support  Stairs            Wheelchair Mobility    Modified Rankin (Stroke Patients Only)       Balance Overall balance assessment: Needs assistance   Sitting balance-Leahy Scale: Good       Standing balance-Leahy Scale: Fair                               Pertinent  Vitals/Pain Pain Assessment: 0-10 Pain Score: 5  Pain Location: abdomen Pain Descriptors / Indicators: Aching Pain Intervention(s): Repositioned;Limited activity within patient's tolerance    Home Living Family/patient expects to be discharged to:: Private residence Living Arrangements: Spouse/significant other Available Help at Discharge: Family;Available 24 hours/day Type of Home: House Home Access: Stairs to enter Entrance Stairs-Rails: None Entrance Stairs-Number of Steps: 2 Home Layout: One level Home Equipment: Walker - 2 wheels;Cane - single point      Prior Function Level of Independence: Independent         Comments: pt reports one fall in the last year, furniture walks at home and uses cart when out shopping. Pt with multiple back surgeries and RTKA that he reports impair his balance     Hand Dominance        Extremity/Trunk Assessment   Upper Extremity Assessment: Overall WFL for tasks assessed           Lower Extremity Assessment: Overall WFL for tasks assessed      Cervical / Trunk Assessment: Normal  Communication   Communication: No difficulties  Cognition Arousal/Alertness: Awake/alert Behavior During Therapy: WFL for tasks assessed/performed Overall Cognitive Status: Within Functional Limits for tasks assessed                      General Comments      Exercises  Assessment/Plan    PT Assessment Patient needs continued PT services  PT Diagnosis Difficulty walking   PT Problem List Decreased activity tolerance;Decreased balance;Decreased knowledge of use of DME  PT Treatment Interventions Gait training;DME instruction;Balance training;Functional mobility training;Therapeutic activities;Patient/family education   PT Goals (Current goals can be found in the Care Plan section) Acute Rehab PT Goals Patient Stated Goal: be able to leave PT Goal Formulation: With patient Time For Goal Achievement: 07/22/14 Potential to  Achieve Goals: Good    Frequency Min 3X/week   Barriers to discharge        Co-evaluation               End of Session   Activity Tolerance: Patient tolerated treatment well Patient left: in chair;with call bell/phone within [name] Nurse Communication: Mobility status;Precautions         Time: 9166-0600 PT Time Calculation (min): 13 min   Charges:   PT Evaluation $Initial PT Evaluation Tier I: 1 Procedure PT Treatments $Gait Training: 8-22 mins   PT G CodesMelford [name] 07/08/2014, 8:18 AM [name], [place]

## 2014-07-09 LAB — COMPREHENSIVE METABOLIC PANEL
ALK PHOS: 125 U/L — AB (ref 39–117)
ALT: 31 U/L (ref 0–53)
AST: 18 U/L (ref 0–37)
Albumin: 2 g/dL — ABNORMAL LOW (ref 3.5–5.2)
Anion gap: 14 (ref 5–15)
BILIRUBIN TOTAL: 2.3 mg/dL — AB (ref 0.3–1.2)
BUN: 13 mg/dL (ref 6–23)
CALCIUM: 8.9 mg/dL (ref 8.4–10.5)
CHLORIDE: 95 meq/L — AB (ref 96–112)
CO2: 22 mEq/L (ref 19–32)
Creatinine, Ser: 1.05 mg/dL (ref 0.50–1.35)
GFR, EST AFRICAN AMERICAN: 78 mL/min — AB (ref >90)
GFR, EST NON AFRICAN AMERICAN: 67 mL/min — AB (ref >90)
GLUCOSE: 192 mg/dL — AB (ref 70–99)
Potassium: 3.9 mEq/L (ref 3.7–5.3)
SODIUM: 131 meq/L — AB (ref 137–147)
Total Protein: 6.2 g/dL (ref 6.0–8.3)

## 2014-07-09 LAB — GLUCOSE, CAPILLARY
GLUCOSE-CAPILLARY: 187 mg/dL — AB (ref 70–99)
Glucose-Capillary: 209 mg/dL — ABNORMAL HIGH (ref 70–99)
Glucose-Capillary: 288 mg/dL — ABNORMAL HIGH (ref 70–99)
Glucose-Capillary: 178 mg/dL — ABNORMAL HIGH (ref 70–99)
Glucose-Capillary: 233 mg/dL — ABNORMAL HIGH (ref 70–99)

## 2014-07-09 LAB — CBC
HCT: 26.5 % — ABNORMAL LOW (ref 39.0–52.0)
HEMOGLOBIN: 9.1 g/dL — AB (ref 13.0–17.0)
MCH: 29.7 pg (ref 26.0–34.0)
MCHC: 34.3 g/dL (ref 30.0–36.0)
MCV: 86.6 fL (ref 78.0–100.0)
Platelets: 317 10*3/uL (ref 150–400)
RBC: 3.06 MIL/uL — AB (ref 4.22–5.81)
RDW: 14 % (ref 11.5–15.5)
WBC: 15.8 10*3/uL — ABNORMAL HIGH (ref 4.0–10.5)

## 2014-07-09 MED ORDER — POLYETHYLENE GLYCOL 3350 17 G PO PACK
17.0000 g | PACK | Freq: Two times a day (BID) | ORAL | Status: DC
Start: 2014-07-09 — End: 2014-07-11
  Administered 2014-07-09 – 2014-07-10 (×3): 17 g via ORAL
  Filled 2014-07-09 (×5): qty 1

## 2014-07-09 NOTE — Progress Notes (Signed)
Inpatient Diabetes Program Recommendations  AACE/ADA: New Consensus Statement on Inpatient Glycemic Control (2013)  Target Ranges:  Prepandial:   less than 140 mg/dL      Peak postprandial:   less than 180 mg/dL (1-2 hours)      Critically ill patients:  140 - 180 mg/dL     Results for [name], [name] (MRN [id number]) as of 07/09/2014 07:34  Ref. Range 07/08/2014 07:45 07/08/2014 11:50 07/08/2014 17:00 07/08/2014 20:40  Glucose-Capillary Latest Range: 70-99 mg/dL 186 (H) 191 (H) 216 (H) 206 (H)     Patient now on Solid Heart Healthy diet.  Eating 50-60% of meals.  Having some glucose elevations.    MD- Please consider adding Novolog Meal Coverage- Novolog 3 units tid with meals     Will follow Wyn Quaker RN, MSN, CDE Diabetes Coordinator Inpatient Diabetes Program Team Pager: [phone] (8a-10p)

## 2014-07-09 NOTE — Progress Notes (Signed)
Daily Rounding Note  07/09/2014, 8:49 AM  LOS: 12 days   SUBJECTIVE:       Feels better, tolerating solids.  LUQ pain is less.  No BMs.  Urinating well, urine not dark  OBJECTIVE:         Vital signs in last 24 hours:    Temp:  [98.1 F (36.7 C)-99.1 F (37.3 C)] 99.1 F (37.3 C) (10/06 0500) Pulse Rate:  [67-80] 70 (10/06 0500) Resp:  [16-18] 18 (10/06 0500) BP: (135-142)/(67-84) 142/73 mmHg (10/06 0500) SpO2:  [96 %-99 %] 96 % (10/06 0500) Weight:  [95.3 kg (210 lb 1.6 oz)] 95.3 kg (210 lb 1.6 oz) (10/06 0500) Last BM Date: 07/07/14 Filed Weights   07/07/14 0500 07/08/14 0515 07/09/14 0500  Weight: 95.573 kg (210 lb 11.2 oz) 95.5 kg (210 lb 8.6 oz) 95.3 kg (210 lb 1.6 oz)   General: looks better, still with scleral icterus   Heart: RRR. Chest: clear bil.  No cough or dyspnea Abdomen: soft, minor LUQ tenderness, active BS  Extremities: no CCE Neuro/Psych:  Alert, pleasant.  No gross deficits, tremors, limb weakness.   Intake/Output from previous day: 10/05 0701 - 10/06 0700 In: 840 [P.O.:840] Out: 2505 [Urine:2505]  Intake/Output this shift: Total I/O In: -  Out: 700 [Urine:700]  Lab Results:  Recent Labs  07/07/14 0245 07/08/14 0415 07/09/14 0441  WBC 10.8* 15.1* 15.8*  HGB 10.1* 10.1* 9.1*  HCT 28.9* 28.9* 26.5*  PLT 295 334 317  MCV                                     86 BMET  Recent Labs  07/07/14 0245 07/08/14 0415 07/09/14 0441  NA 131* 131* 131*  K 4.8 3.9 3.9  CL 98 96 95*  CO2 18* 22 22  GLUCOSE 160* 181* 192*  BUN 26* 15 13  CREATININE 1.15 1.11 1.05  CALCIUM 8.7 9.1 8.9   LFT  Recent Labs  07/07/14 0245 07/08/14 0415 07/09/14 0441  PROT 6.4 6.7 6.2  ALBUMIN 2.1* 2.2* 2.0*  AST 41* 27 18  ALT 58* 45 31  ALKPHOS 179* 158* 125*  BILITOT 4.5* 3.3* 2.3*  BILIDIR  --  2.2*  --   IBILI  --  1.1*  --    Scheduled Meds: . amLODipine  5 mg Oral Daily  . aspirin EC  81  mg Oral Daily  . clopidogrel  75 mg Oral Daily  . ezetimibe  10 mg Oral Daily  . fenofibrate  160 mg Oral Daily  . insulin aspart  0-15 Units Subcutaneous TID WC  . isosorbide mononitrate  30 mg Oral Daily  . metoprolol succinate  25 mg Oral Daily  . pantoprazole  40 mg Oral Daily  . piperacillin-tazobactam (ZOSYN)  IV  3.375 g Intravenous 3 times per day  . polyethylene glycol  17 g Oral Daily  . vancomycin  1,000 mg Intravenous Q12H   Continuous Infusions: . sodium chloride 75 mL (07/06/14 1416)   PRN Meds:.acetaminophen, HYDROmorphone (DILAUDID) injection, nitroGLYCERIN, ondansetron (ZOFRAN) IV, oxyCODONE   ASSESMENT:   * Choledocholithiasis. Cholelithiasis. No surgical option a this time due to Plavix requirement.  9/30 ERCP #1 with metal stent placement. Developed pancreatitis, cholangitis, stone occlusion of stent post op  10/2 ERCP # 2: Stent replaced, bile duct swept of stones and pus.  Day  6 Vanco and Zosyn.  LFTs and Lipase all trended down but WBCs remain elevated. No fever  * Unstable angina. Ruled out for MI. DES placed 05/17/14 and committed to one year of Plavix. On  81 ASA.  * CHF, diastolic, chronic.   *  Normocytic anemia.  Dilutional effect of aggressive IV hydration likely contributing.   *  DM2. No meds at home, currently on SSI.     PLAN   *  Would like to see WBC count drop so keep in pt for another day. CBC in AM.  *  Wonder if we still need the vancomycin, consider stopping this and continuing Zosyn alone? Went ahead and discontinued Vancomycin.   *  Will arrange follow up with Dr Fuller Plan, his GI MD, or GI APP within one month of discharge.  *  IVF to [facility] *  If persistent elevation of WBCs tomorrow, may need CT scan abdomen which he has not had.      [name]  07/09/2014, 8:49 AM Pager: [phone]     Attending physician's note   I have taken an interval history, reviewed the chart and examined the patient. I agree with the Advanced  Practitioner's note, impression and recommendations. Steadily improving. Continue IV Zosyn today. If stable tomorrow would change to a PO antibiotic for at least 7 more days and plan for discharge. GI follow up with me in 1 month. GI signing off.  Pricilla Riffle. Fuller Plan, MD [facility]

## 2014-07-09 NOTE — Progress Notes (Signed)
[facility] PROGRESS NOTE   HERVEY WEDIG HQP:[id number] DOB: 1935-10-07 DOA: 06/27/2014 PCP: [name], MD  HPI/Subjective: Pain better, tolerating diet. No BM yet.    Assessment/Plan: Principal Problem:   Cholangitis Active Problems:   Type II diabetes mellitus with complication -- CAD, AAA   Hypertension associated with diabetes   Hyperlipidemia LDL goal <70; statin intolerant   Chronic diastolic CHF (congestive heart failure)   CAD S/P CABG '95- several PCis since, last 05/17/14   History of ST elevation myocardial infarction (STEMI) of inferolateral wall (PTCA - 100% large lateral OM)   Chronic renal insufficiency, stage III (moderate)   Unstable angina pectoris   Coronary stent restenosis with uncertain cause: Required PCI for ISR of OM1 (Promus P 2.75 mm x 12 mm) & pRCA   Calculus of bile duct without mention of cholecystitis or obstruction   Pancreatitis, acute   Choledocholithiasis  78 y/o history of significant coronary artery disease status post CABG x2 in 1997, . He was admitted 05/14/14 with unstable angina by cardiologist service. He had a cath followed by stage PCI 05/17/14 two days later secondary to renal insufficiency. He had an RCA DES placed and an OM1 DES placed. He presents on 9-24 with recurrent chest pain. He had a repeat Cath on 9-25 which show patent stent. He had Abdominal US that showed gallstone. Subsequently had MRCP which showed Choledocolithiasis with intra and extrahepatic biliary dilatation. GI was consulted. Patient undergone ERCP and placement of biliary stent on 9/30 done by Dr. Deatra Ina. After that patient fever was worsening, bilirubin also was worsening. ERCP repeated on 10/2 with removal of old stent and placement of a new covered stent. Patient is on IV zosyn. LFT trending down, abdominal pain improving. Leukocytosis persist.   Patient care was transfer to [facility].  Acute cholangitis -Patient admitted to the hospital with  RUQ/chest pain after recent PCI. -Developed fever while in the hospital, also had transaminitis and jaundice. -MRCP done on 9/27 showed CBD stones. -Patient undergone ERCP and placement of biliary stent on 9/30 done by Dr. Deatra Ina. -After that patient fever was worsening, bilirubin also was worsening. -ERCP repeated on 10/2 with removal of old stent and placement of a new covered stent. -received ancomycin for 2 days,  and Zosyn day 6.  -WBC remain at 15.  -LFT trending down.   Acute pancreatitis -Probably post ERCP pancreatitis, lipase was >3000 on 10/1. -Lipase was 1873 on 10/2, abdominal pain improving. -Continue with heart healthy diet.  -Lipase decrease to 281.  Sepsis -Fever of 102.8 with a respiratory rate of 30 and presence of acute cholangitis. -Blood cultures obtained and NGTD. Continue broad-spectrum antibiotics.  Type 2 diabetes mellitus -Patient is on pioglitazone and metformin both on hold. -Insulin sliding scale, currently patient is on full liquid diet.3  CAD with recent PCI -Cardiac catheterization with placement of DES (on 8/14) for ISR in the mid obtuse marginal and the proximal RCA. -Continue Plavix.  CKD stage III  -Creatinine baseline about 1.3, currently elevated at 1.7, continue IV fluid hydration. -Improving with IV fluids. Cr decrease to 1.1.  Code Status: Full code Family Communication: Plan discussed with the patient. Disposition Plan: Remains inpatient. PT consult.    Consultants:  Gastroenterology  Procedures:  None  Antibiotics:  Zosyn    Objective: Filed Vitals:   07/09/14 1329  BP: 136/81  Pulse: 87  Temp: 97.7 F (36.5 C)  Resp: 17    Intake/Output Summary (Last 24 hours) at 07/09/14  1410 Last data filed at 07/09/14 0800  Gross per 24 hour  Intake    480 ml  Output   2150 ml  Net  -1670 ml   Filed Weights   07/07/14 0500 07/08/14 0515 07/09/14 0500  Weight: 95.573 kg (210 lb 11.2 oz) 95.5 kg (210 lb 8.6 oz) 95.3 kg  (210 lb 1.6 oz)    Exam: General: Alert and awake, oriented x3, not in any acute distress. HEENT: anicteric sclera, pupils reactive to light and accommodation, EOMI CVS: S1-S2 clear, no murmur rubs or gallops Chest: clear to auscultation bilaterally, no wheezing, rales or rhonchi Abdomen: soft nontender, nondistended, normal bowel sounds, no organomegaly Extremities: no cyanosis, clubbing or edema noted bilaterally   Data Reviewed: Basic Metabolic Panel:  Recent Labs Lab 07/04/14 0556 07/05/14 0500 07/07/14 0245 07/08/14 0415 07/09/14 0441  NA 136* 132* 131* 131* 131*  K 4.7 4.7 4.8 3.9 3.9  CL 99 98 98 96 95*  CO2 24 20 18* 22 22  GLUCOSE 229* 154* 160* 181* 192*  BUN 27* 37* 26* 15 13  CREATININE 1.45* 1.72* 1.15 1.11 1.05  CALCIUM 9.5 8.7 8.7 9.1 8.9   Liver Function Tests:  Recent Labs Lab 07/05/14 0500 07/06/14 0535 07/07/14 0245 07/08/14 0415 07/09/14 0441  AST 68* 51* 41* 27 18  ALT 85* 66* 58* 45 31  ALKPHOS 204* 182* 179* 158* 125*  BILITOT 7.5* 8.3* 4.5* 3.3* 2.3*  PROT 6.2 5.9* 6.4 6.7 6.2  ALBUMIN 2.4* 2.1* 2.1* 2.2* 2.0*    Recent Labs Lab 07/04/14 0556 07/05/14 0500 07/07/14 0245  LIPASE >3000* 1873* 281*  AMYLASE 1847*  --   --    No results found for this basename: AMMONIA,  in the last 168 hours CBC:  Recent Labs Lab 07/05/14 0500 07/06/14 0535 07/07/14 0245 07/08/14 0415 07/09/14 0441  WBC 10.4 7.9 10.8* 15.1* 15.8*  HGB 9.6* 8.9* 10.1* 10.1* 9.1*  HCT 28.2* 26.2* 28.9* 28.9* 26.5*  MCV 93.4 90.0 87.8 87.8 86.6  PLT 233 201 295 334 317   Cardiac Enzymes: No results found for this basename: CKTOTAL, CKMB, CKMBINDEX, TROPONINI,  in the last 168 hours BNP (last 3 results)  Recent Labs  06/27/14 1954 07/04/14 1300  PROBNP 333.0 397.2   CBG:  Recent Labs Lab 07/08/14 1150 07/08/14 1700 07/08/14 2040 07/09/14 0753 07/09/14 1136  GLUCAP 191* 216* 206* 187* 209*    Micro Recent Results (from the past 240 hour(s))   CULTURE, BLOOD (ROUTINE X 2)     Status: None   Collection Time    07/04/14  9:17 PM      Result Value Ref Range Status   Specimen Description BLOOD RIGHT ARM   Final   Special Requests BOTTLES DRAWN AEROBIC AND ANAEROBIC 10CC   Final   Culture  Setup Time     Final   Value: 07/05/2014 00:50     Performed at [facility]   Culture     Final   Value:        BLOOD CULTURE RECEIVED NO GROWTH TO DATE CULTURE WILL BE HELD FOR 5 DAYS BEFORE ISSUING A FINAL NEGATIVE REPORT     Performed at [facility]   Report Status PENDING   Incomplete  CULTURE, BLOOD (ROUTINE X 2)     Status: None   Collection Time    07/04/14  9:20 PM      Result Value Ref Range Status   Specimen Description BLOOD LEFT ARM  Final   Special Requests BOTTLES DRAWN AEROBIC AND ANAEROBIC 10CC EACH   Final   Culture  Setup Time     Final   Value: 07/05/2014 00:57     Performed at [facility]   Culture     Final   Value:        BLOOD CULTURE RECEIVED NO GROWTH TO DATE CULTURE WILL BE HELD FOR 5 DAYS BEFORE ISSUING A FINAL NEGATIVE REPORT     Performed at [facility]   Report Status PENDING   Incomplete     Studies: No results found.  Scheduled Meds: . amLODipine  5 mg Oral Daily  . aspirin EC  81 mg Oral Daily  . clopidogrel  75 mg Oral Daily  . ezetimibe  10 mg Oral Daily  . fenofibrate  160 mg Oral Daily  . insulin aspart  0-15 Units Subcutaneous TID WC  . isosorbide mononitrate  30 mg Oral Daily  . metoprolol succinate  25 mg Oral Daily  . pantoprazole  40 mg Oral Daily  . piperacillin-tazobactam (ZOSYN)  IV  3.375 g Intravenous 3 times per day  . polyethylene glycol  17 g Oral Daily   Continuous Infusions:       Time spent: 25 minutes    [name] Hummer A  [facility] Pager [phone] If 7PM-7AM, please contact night-coverage at [email or web address], password [facility] 07/09/2014, 2:10 PM  LOS: 12 days

## 2014-07-10 LAB — COMPREHENSIVE METABOLIC PANEL
ALT: 29 U/L (ref 0–53)
ANION GAP: 15 (ref 5–15)
AST: 22 U/L (ref 0–37)
Albumin: 1.9 g/dL — ABNORMAL LOW (ref 3.5–5.2)
Alkaline Phosphatase: 118 U/L — ABNORMAL HIGH (ref 39–117)
BUN: 15 mg/dL (ref 6–23)
CO2: 22 mEq/L (ref 19–32)
Calcium: 8.9 mg/dL (ref 8.4–10.5)
Chloride: 93 mEq/L — ABNORMAL LOW (ref 96–112)
Creatinine, Ser: 1.13 mg/dL (ref 0.50–1.35)
GFR calc non Af Amer: 61 mL/min — ABNORMAL LOW (ref >90)
GFR, EST AFRICAN AMERICAN: 71 mL/min — AB (ref >90)
Glucose, Bld: 211 mg/dL — ABNORMAL HIGH (ref 70–99)
Potassium: 3.9 mEq/L (ref 3.7–5.3)
Sodium: 130 mEq/L — ABNORMAL LOW (ref 137–147)
TOTAL PROTEIN: 6.2 g/dL (ref 6.0–8.3)
Total Bilirubin: 2 mg/dL — ABNORMAL HIGH (ref 0.3–1.2)

## 2014-07-10 LAB — LIPASE, BLOOD: Lipase: 107 U/L — ABNORMAL HIGH (ref 11–59)

## 2014-07-10 LAB — GLUCOSE, CAPILLARY
GLUCOSE-CAPILLARY: 200 mg/dL — AB (ref 70–99)
GLUCOSE-CAPILLARY: 261 mg/dL — AB (ref 70–99)
Glucose-Capillary: 251 mg/dL — ABNORMAL HIGH (ref 70–99)
Glucose-Capillary: 112 mg/dL — ABNORMAL HIGH (ref 70–99)

## 2014-07-10 LAB — CBC
HEMATOCRIT: 25.6 % — AB (ref 39.0–52.0)
Hemoglobin: 9 g/dL — ABNORMAL LOW (ref 13.0–17.0)
MCH: 30.7 pg (ref 26.0–34.0)
MCHC: 35.2 g/dL (ref 30.0–36.0)
MCV: 87.4 fL (ref 78.0–100.0)
Platelets: 343 10*3/uL (ref 150–400)
RBC: 2.93 MIL/uL — ABNORMAL LOW (ref 4.22–5.81)
RDW: 13.9 % (ref 11.5–15.5)
WBC: 15.4 10*3/uL — ABNORMAL HIGH (ref 4.0–10.5)

## 2014-07-10 MED ORDER — INSULIN ASPART 100 UNIT/ML ~~LOC~~ SOLN
3.0000 [IU] | Freq: Three times a day (TID) | SUBCUTANEOUS | Status: DC
  Administered 2014-07-10 – 2014-07-11 (×3): 3 [IU] via SUBCUTANEOUS

## 2014-07-10 MED ORDER — METRONIDAZOLE 500 MG PO TABS
500.0000 mg | ORAL_TABLET | Freq: Three times a day (TID) | ORAL | Status: DC
  Administered 2014-07-10 – 2014-07-11 (×3): 500 mg via ORAL
  Filled 2014-07-10 (×6): qty 1

## 2014-07-10 MED ORDER — CIPROFLOXACIN HCL 500 MG PO TABS
500.0000 mg | ORAL_TABLET | Freq: Two times a day (BID) | ORAL | Status: DC
  Administered 2014-07-10 – 2014-07-11 (×3): 500 mg via ORAL
  Filled 2014-07-10 (×5): qty 1

## 2014-07-10 NOTE — Progress Notes (Signed)
Inpatient Diabetes Program Recommendations  AACE/ADA: New Consensus Statement on Inpatient Glycemic Control (2013)  Target Ranges:  Prepandial:   less than 140 mg/dL      Peak postprandial:   less than 180 mg/dL (1-2 hours)      Critically ill patients:  140 - 180 mg/dL    Results for [name], [name] (MRN [id number]) as of 07/10/2014 07:06  Ref. Range 07/09/2014 07:53 07/09/2014 11:36 07/09/2014 14:22 07/09/2014 17:24 07/09/2014 21:48  Glucose-Capillary Latest Range: 70-99 mg/dL 187 (H) 209 (H) 288 (H) 178 (H) 233 (H)      Patient now on Solid Heart Healthy diet. Eating 60-80% of meals.   Having some glucose elevations.    MD- Please consider adding Novolog Meal Coverage- Novolog 3 units tid with meals    Will follow Wyn Quaker RN, MSN, CDE Diabetes Coordinator Inpatient Diabetes Program Team Pager: [phone] (8a-10p)

## 2014-07-10 NOTE — Progress Notes (Signed)
Chaplain visited with pt upon seeing that pt had been in hospital for nearly 2 weeks. Pt stated it has been a difficult time, "aggravating." Chaplain helped pt reflect on his medical and life history and his feelings about being in the hospital. Pt identified fishing as very meaningful to him and a source of great joy. Will follow as needed.   Vanetta Mulders 07/10/2014 3:39 PM

## 2014-07-10 NOTE — Progress Notes (Signed)
Physical Therapy Treatment Patient Details Name: [name] MRN: [id number] DOB: 1936/04/17 Today's Date: 07/10/2014    History of Present Illness Patient admitted to the hospital with RUQ/chest pain after recent PCI. Developed fever while in the hospital, also had transaminitis and jaundice. MRCP done on 9/27 showed CBD stones. Patient undergone ERCP and placement of biliary stent on 9/30. ERCP repeated on 10/2 with removal of old stent and placement of a new covered stent.    PT Comments    Progressing steadily,  Much less unsteady today, but would need a cane or appropriate device outside on unlevel surfaces.  DGI predicts risk for falls.  Follow Up Recommendations  Outpatient PT     Equipment Recommendations  None recommended by PT    Recommendations for Other Services       Precautions / Restrictions Precautions Precautions: Fall Restrictions Weight Bearing Restrictions: No    Mobility  Bed Mobility Overal bed mobility: Modified Independent                Transfers Overall transfer level: Modified independent               General transfer comment: safe transfer technique  Ambulation/Gait Ambulation/Gait assistance: Supervision Ambulation Distance (Feet): 500 Feet Assistive device: None Gait Pattern/deviations: Step-through pattern Gait velocity: slow.  little discernable increase in speed on cue   General Gait Details: slow, but generally steady; bil knee flexion consistently  See DGI   Stairs Stairs: Yes   Stair Management: Two rails;Step to pattern;Forwards Number of Stairs: 5 General stair comments: slow and guarded, step to pattern  Wheelchair Mobility    Modified Rankin (Stroke Patients Only)       Balance Overall balance assessment: Needs assistance   Sitting balance-Leahy Scale: Good       Standing balance-Leahy Scale: Fair                      Cognition Arousal/Alertness: Awake/alert Behavior During  Therapy: WFL for tasks assessed/performed Overall Cognitive Status: Within Functional Limits for tasks assessed                      Exercises      General Comments General comments (skin integrity, edema, etc.): pt was still adamant that he did not need cane.  And that he would use the cane when he felt he needed it.      Pertinent Vitals/Pain Pain Assessment: 0-10 Pain Score: 7  Pain Location: back pain Pain Descriptors / Indicators: Aching;Constant Pain Intervention(s): Limited activity within patient's tolerance;Monitored during session    Home Living                      Prior Function            PT Goals (current goals can now be found in the care plan section) Acute Rehab PT Goals Patient Stated Goal: be able to leave PT Goal Formulation: With patient Time For Goal Achievement: 07/22/14 Potential to Achieve Goals: Good Progress towards PT goals: Progressing toward goals    Frequency  Min 3X/week    PT Plan Current plan remains appropriate    Co-evaluation             End of Session   Activity Tolerance: Patient tolerated treatment well Patient left: in bed;with call bell/phone within reach     Time: 1428-1446 PT Time Calculation (min): 18 min  Charges:  $Gait Training:  8-22 mins                    G Codes:      Opel Lejeune, Tessie Fass 07/10/2014, 2:56 PM 07/10/2014  Donnella Sham, PT [phone]  (pager)

## 2014-07-10 NOTE — Progress Notes (Signed)
[facility] PROGRESS NOTE   [name] GGY:[id number] DOB: 18-Nov-1935 DOA: 06/27/2014 PCP: [name], MD  HPI/Subjective: Pain better, tolerating diet. BM this am  Assessment/Plan: Principal Problem:   Cholangitis Active Problems:   Type II diabetes mellitus with complication -- CAD, AAA   Hypertension associated with diabetes   Hyperlipidemia LDL goal <70; statin intolerant   Chronic diastolic CHF (congestive heart failure)   CAD S/P CABG '95- several PCis since, last 05/17/14   History of ST elevation myocardial infarction (STEMI) of inferolateral wall (PTCA - 100% large lateral OM)   Chronic renal insufficiency, stage III (moderate)   Unstable angina pectoris   Coronary stent restenosis with uncertain cause: Required PCI for ISR of OM1 (Promus P 2.75 mm x 12 mm) & pRCA   Calculus of bile duct without mention of cholecystitis or obstruction   Pancreatitis, acute   Choledocholithiasis  79 y/o history of significant coronary artery disease status post CABG x2 in 1997, . He was admitted 05/14/14 with unstable angina by cardiologist service. He had a cath followed by stage PCI 05/17/14 two days later secondary to renal insufficiency. He had an RCA DES placed and an OM1 DES placed. He presents on 9-24 with recurrent chest pain. He had a repeat Cath on 9-25 which show patent stent. He had Abdominal US that showed gallstone. Subsequently had MRCP which showed Choledocolithiasis with intra and extrahepatic biliary dilatation. GI was consulted. Patient undergone ERCP and placement of biliary stent on 9/30 done by Dr. Deatra Ina. After that patient fever was worsening, bilirubin also was worsening. ERCP repeated on 10/2 with removal of old stent and placement of a new covered stent. Patient is on IV zosyn. LFT trending down, abdominal pain improving. Leukocytosis persist.   [facility]  Acute cholangitis -Patient admitted to the hospital with  RUQ/chest pain after recent PCI. -Developed fever while in the hospital, also had transaminitis and jaundice. -MRCP done on 9/27 showed CBD stones. -Patient undergone ERCP and placement of biliary stent on 9/30 done by Dr. Deatra Ina. -After that patient fever was worsening, bilirubin also was worsening. -ERCP repeated on 10/2 with removal of old stent and placement of a new covered stent. -received Vancomycin for 2 days,  and Zosyn day 7.  -WBC stable at 15, change to PO Cipro/Flagyl -LFT trending down.  -Hopefully home tomorrow with Gi FU  Acute pancreatitis -Probably post ERCP pancreatitis, lipase was >3000 on 10/1. -Lipase was 1873 on 10/2, abdominal pain improving. -Continue with heart healthy diet.  -improved  Sepsis -Fever of 102.8 with a respiratory rate of 30 and presence of acute cholangitis. -Blood cultures obtained and NGTD.  -change to PO Abx  Type 2 diabetes mellitus -Patient is on pioglitazone and metformin both on hold. -Insulin sliding scale, add novolog with meals  CAD with recent PCI -Cardiac catheterization with placement of DES (on 8/14) for ISR in the mid obtuse marginal and the proximal RCA. -Continue Plavix.  CKD stage III  -Creatinine baseline about 1.3, currently elevated at 1.7, continue IV fluid hydration. -Improving with IV fluids. Cr decrease to 1.1.  Code Status: Full code Family Communication: Plan discussed with the patient. Disposition Plan: home tomorrow if WBC tredning down   Consultants:  Gastroenterology  Procedures:  None  Antibiotics:  Zosyn    Objective: Filed Vitals:   07/10/14 1042  BP: 133/67  Pulse:   Temp:   Resp:     Intake/Output Summary (Last 24 hours) at  07/10/14 1125 Last data filed at 07/10/14 0803  Gross per 24 hour  Intake      0 ml  Output   1925 ml  Net  -1925 ml   Filed Weights   07/08/14 0515 07/09/14 0500 07/10/14 0500  Weight: 95.5 kg (210 lb 8.6 oz) 95.3 kg (210 lb 1.6 oz) 91.4 kg (201 lb 8  oz)    Exam: General: Alert and awake, oriented x3, not in any acute distress. HEENT: anicteric sclera, pupils reactive to light and accommodation, EOMI CVS: S1-S2 clear, no murmur rubs or gallops Chest: clear to auscultation bilaterally, no wheezing, rales or rhonchi Abdomen: soft mild R sided tenderness, nondistended, normal bowel sounds, no organomegaly Extremities: no cyanosis, clubbing or edema noted bilaterally   Data Reviewed: Basic Metabolic Panel:  Recent Labs Lab 07/05/14 0500 07/07/14 0245 07/08/14 0415 07/09/14 0441 07/10/14 0413  NA 132* 131* 131* 131* 130*  K 4.7 4.8 3.9 3.9 3.9  CL 98 98 96 95* 93*  CO2 20 18* 22 22 22   GLUCOSE 154* 160* 181* 192* 211*  BUN 37* 26* 15 13 15   CREATININE 1.72* 1.15 1.11 1.05 1.13  CALCIUM 8.7 8.7 9.1 8.9 8.9   Liver Function Tests:  Recent Labs Lab 07/06/14 0535 07/07/14 0245 07/08/14 0415 07/09/14 0441 07/10/14 0413  AST 51* 41* 27 18 22   ALT 66* 58* 45 31 29  ALKPHOS 182* 179* 158* 125* 118*  BILITOT 8.3* 4.5* 3.3* 2.3* 2.0*  PROT 5.9* 6.4 6.7 6.2 6.2  ALBUMIN 2.1* 2.1* 2.2* 2.0* 1.9*    Recent Labs Lab 07/04/14 0556 07/05/14 0500 07/07/14 0245 07/10/14 0413  LIPASE >3000* 1873* 281* 107*  AMYLASE 1847*  --   --   --    No results found for this basename: AMMONIA,  in the last 168 hours CBC:  Recent Labs Lab 07/06/14 0535 07/07/14 0245 07/08/14 0415 07/09/14 0441 07/10/14 0413  WBC 7.9 10.8* 15.1* 15.8* 15.4*  HGB 8.9* 10.1* 10.1* 9.1* 9.0*  HCT 26.2* 28.9* 28.9* 26.5* 25.6*  MCV 90.0 87.8 87.8 86.6 87.4  PLT 201 295 334 317 343   Cardiac Enzymes: No results found for this basename: CKTOTAL, CKMB, CKMBINDEX, TROPONINI,  in the last 168 hours BNP (last 3 results)  Recent Labs  06/27/14 1954 07/04/14 1300  PROBNP 333.0 397.2   CBG:  Recent Labs Lab 07/09/14 1136 07/09/14 1422 07/09/14 1724 07/09/14 2148 07/10/14 0800  GLUCAP 209* 288* 178* 233* 200*    Micro Recent Results  (from the past 240 hour(s))  CULTURE, BLOOD (ROUTINE X 2)     Status: None   Collection Time    07/04/14  9:17 PM      Result Value Ref Range Status   Specimen Description BLOOD RIGHT ARM   Final   Special Requests BOTTLES DRAWN AEROBIC AND ANAEROBIC 10CC   Final   Culture  Setup Time     Final   Value: 07/05/2014 00:50     Performed at [facility]   Culture     Final   Value:        BLOOD CULTURE RECEIVED NO GROWTH TO DATE CULTURE WILL BE HELD FOR 5 DAYS BEFORE ISSUING A FINAL NEGATIVE REPORT     Performed at [facility]   Report Status PENDING   Incomplete  CULTURE, BLOOD (ROUTINE X 2)     Status: None   Collection Time    07/04/14  9:20 PM  Result Value Ref Range Status   Specimen Description BLOOD LEFT ARM   Final   Special Requests BOTTLES DRAWN AEROBIC AND ANAEROBIC 10CC EACH   Final   Culture  Setup Time     Final   Value: 07/05/2014 00:57     Performed at [facility]   Culture     Final   Value:        BLOOD CULTURE RECEIVED NO GROWTH TO DATE CULTURE WILL BE HELD FOR 5 DAYS BEFORE ISSUING A FINAL NEGATIVE REPORT     Performed at [facility]   Report Status PENDING   Incomplete     Studies: No results found.  Scheduled Meds: . amLODipine  5 mg Oral Daily  . aspirin EC  81 mg Oral Daily  . clopidogrel  75 mg Oral Daily  . ezetimibe  10 mg Oral Daily  . fenofibrate  160 mg Oral Daily  . insulin aspart  0-15 Units Subcutaneous TID WC  . isosorbide mononitrate  30 mg Oral Daily  . metoprolol succinate  25 mg Oral Daily  . pantoprazole  40 mg Oral Daily  . piperacillin-tazobactam (ZOSYN)  IV  3.375 g Intravenous 3 times per day  . polyethylene glycol  17 g Oral BID   Continuous Infusions:       Time spent: 25 minutes    Neyah Ellerman  [facility] Pager [phone] If 7PM-7AM, please contact night-coverage at [email or web address], password [facility] 07/10/2014, 11:25 AM  LOS: 13 days

## 2014-07-11 LAB — CULTURE, BLOOD (ROUTINE X 2)
CULTURE: NO GROWTH
Culture: NO GROWTH

## 2014-07-11 LAB — COMPREHENSIVE METABOLIC PANEL
ALT: 26 U/L (ref 0–53)
AST: 21 U/L (ref 0–37)
Albumin: 1.9 g/dL — ABNORMAL LOW (ref 3.5–5.2)
Alkaline Phosphatase: 104 U/L (ref 39–117)
Anion gap: 15 (ref 5–15)
BUN: 18 mg/dL (ref 6–23)
CALCIUM: 8.8 mg/dL (ref 8.4–10.5)
CO2: 22 mEq/L (ref 19–32)
Chloride: 94 mEq/L — ABNORMAL LOW (ref 96–112)
Creatinine, Ser: 1.32 mg/dL (ref 0.50–1.35)
GFR calc Af Amer: 59 mL/min — ABNORMAL LOW (ref >90)
GFR calc non Af Amer: 51 mL/min — ABNORMAL LOW (ref >90)
Glucose, Bld: 230 mg/dL — ABNORMAL HIGH (ref 70–99)
Potassium: 4.6 mEq/L (ref 3.7–5.3)
Sodium: 131 mEq/L — ABNORMAL LOW (ref 137–147)
TOTAL PROTEIN: 6.1 g/dL (ref 6.0–8.3)
Total Bilirubin: 1.6 mg/dL — ABNORMAL HIGH (ref 0.3–1.2)

## 2014-07-11 LAB — CBC
HCT: 26 % — ABNORMAL LOW (ref 39.0–52.0)
Hemoglobin: 8.9 g/dL — ABNORMAL LOW (ref 13.0–17.0)
MCH: 30.1 pg (ref 26.0–34.0)
MCHC: 34.2 g/dL (ref 30.0–36.0)
MCV: 87.8 fL (ref 78.0–100.0)
PLATELETS: 364 10*3/uL (ref 150–400)
RBC: 2.96 MIL/uL — ABNORMAL LOW (ref 4.22–5.81)
RDW: 13.9 % (ref 11.5–15.5)
WBC: 12.7 10*3/uL — ABNORMAL HIGH (ref 4.0–10.5)

## 2014-07-11 LAB — GLUCOSE, CAPILLARY
Glucose-Capillary: 235 mg/dL — ABNORMAL HIGH (ref 70–99)
Glucose-Capillary: 282 mg/dL — ABNORMAL HIGH (ref 70–99)

## 2014-07-11 MED ORDER — CIPROFLOXACIN HCL 500 MG PO TABS: 500.0000 mg | ORAL_TABLET | Freq: Two times a day (BID) | ORAL | Status: DC

## 2014-07-11 MED ORDER — METOPROLOL SUCCINATE ER 25 MG PO TB24: 25.0000 mg | ORAL_TABLET | Freq: Every day | ORAL | Status: DC

## 2014-07-11 MED ORDER — POLYETHYLENE GLYCOL 3350 17 G PO PACK: 17.0000 g | PACK | Freq: Every day | ORAL | Status: DC | PRN

## 2014-07-11 MED ORDER — OXYCODONE HCL 5 MG PO TABS: 5.0000 mg | ORAL_TABLET | Freq: Four times a day (QID) | ORAL | Status: DC | PRN

## 2014-07-11 MED ORDER — CLOPIDOGREL BISULFATE 75 MG PO TABS
75.0000 mg | ORAL_TABLET | Freq: Every day | ORAL | Status: DC
Start: 2014-07-11 — End: 2014-10-15

## 2014-07-11 MED ORDER — ISOSORBIDE MONONITRATE ER 30 MG PO TB24: 30.0000 mg | ORAL_TABLET | Freq: Every day | ORAL | Status: DC

## 2014-07-11 MED ORDER — METRONIDAZOLE 500 MG PO TABS: 500.0000 mg | ORAL_TABLET | Freq: Three times a day (TID) | ORAL | Status: DC

## 2014-07-11 MED ORDER — EZETIMIBE 10 MG PO TABS: 10.0000 mg | ORAL_TABLET | Freq: Every day | ORAL | Status: DC

## 2014-07-11 MED ORDER — AMLODIPINE BESYLATE 5 MG PO TABS
5.0000 mg | ORAL_TABLET | Freq: Every day | ORAL | Status: DC
Start: 2014-07-11 — End: 2014-12-25

## 2014-07-11 NOTE — Discharge Summary (Signed)
Physician Discharge Summary  [name] YWV:[id number] DOB: 12-07-1935 DOA: 06/27/2014  PCP: [name], MD  Admit date: 06/27/2014 Discharge date: 07/11/2014  Time spent: 45 minutes  Recommendations for Outpatient Follow-up:  1. Alonza Bogus, [facility] on 10/20 2. Dr.Lalonde PCP in 1 week  Discharge Diagnoses:  Principal Problem:   Cholangitis    Calculus of bile duct without mention of cholecystitis or obstruction   Pancreatitis, acute   Choledocholithiasis   Type II diabetes mellitus with complication -- CAD, AAA   Hypertension associated with diabetes   Hyperlipidemia LDL goal <70; statin intolerant   Chronic diastolic CHF (congestive heart failure)   CAD S/P CABG '95- several PCis since, last 05/17/14   History of ST elevation myocardial infarction (STEMI) of inferolateral wall (PTCA - 100% large lateral OM)   Chronic renal insufficiency, stage III (moderate)   Unstable angina pectoris   Coronary stent restenosis with uncertain cause: Required PCI for ISR of OM1 (Promus P 2.75 mm x 12 mm) & pRCA    Discharge Condition: stable  Diet recommendation: DM, heart healthy  Filed Weights   07/09/14 0500 07/10/14 0500 07/11/14 0500  Weight: 95.3 kg (210 lb 1.6 oz) 91.4 kg (201 lb 8 oz) 89.132 kg (196 lb 8 oz)    History of present illness:   This is a 78 y/o followed for years by Dr Rex Kras, now by Dr Ellyn Hack. He has a history history of significant coronary artery disease status post CABG x2 in 1997. He has had multiple PCIs. He was admitted 05/14/14 with unstable angina. He had a cath followed by stage PCI 05/17/14 two days later secondary to renal insufficiency. He had an RCA DES placed and an OM1 DES placed. P2y12 was checked and was low, Plavix was continued. During his hospitalization his Toprol was decreased and he was taken off Lanoxin for bradycardia. He did not have an LV gram. His last EF was 55/60% by echo Dec 2013. He was seen in the office 06/06/14 for follow  up. He denied chest pain at that time.  Starting 9/21 he has had intermittent chest tightness over the left chest identical to his typical angina except it has not radiated down the left arm. 9/24 night it became worse and he presented to the ER with 76/10 pain which resolved after 3 SL NTG and morphine  Hospital Course:  Acute cholangitis  -Patient admitted to the hospital with RUQ/chest pain after recent PCI.  -Developed fever while in the hospital, also had transaminitis and jaundice.  -MRCP done on 9/27 showed CBD stones.  -Patient undergone ERCP and placement of biliary stent on 9/30 done by Dr. Deatra Ina.  -After that patient fever was worsening, bilirubin also was worsening.  -ERCP repeated on 10/2 with removal of old stent and placement of a new covered stent.  -received Vancomycin for 2 days, and Zosyn day 7.  -WBC improving, clinically stable and improving, changed to PO Cipro/Flagyl  -LFT trending down.  -continue CIpro/Flagyl for 4 more days -GI FU arranged   Acute pancreatitis  -Probably post ERCP pancreatitis, lipase was >3000 on 10/1.  -Lipase was 1873 on 10/2, abdominal pain improving.  -Continue with heart healthy diet.  -improved   Sepsis  -Fever of 102.8 with a respiratory rate of 30 and presence of acute cholangitis.  -Blood cultures obtained and NGTD.  -changed to PO Abx as noted above  Type 2 diabetes mellitus  -resumed home meds, was on Insulin inpatient  CAD with recent  PCI  -Cardiac catheterization with placement of DES (on 8/14) for ISR in the mid obtuse marginal and the proximal RCA.  -Continue Plavix, Toprol, Zetia  CKD stage III  -Creatinine baseline about 1.3, peaked at 1.7, improved with  IV fluid hydration.  -Cr decreased to 1.1.   Procedures:  ERCP and biliary stent 9/30  Repeat ERCP and Biliary stent 10/2   Consultations:  GI  Discharge Exam: Filed Vitals:   07/11/14 0500  BP: 131/58  Pulse: 73  Temp: 97.6 F (36.4 C)  Resp: 16     General: AAOx3 Cardiovascular: S1S2/RRR Respiratory: CTAB  Discharge Instructions You were cared for by a hospitalist during your hospital stay. If you have any questions about your discharge medications or the care you received while you were in the hospital after you are discharged, you can call the unit and asked to speak with the hospitalist on call if the hospitalist that took care of you is not available. Once you are discharged, your primary care physician will handle any further medical issues. Please note that NO REFILLS for any discharge medications will be authorized once you are discharged, as it is imperative that you return to your primary care physician (or establish a relationship with a primary care physician if you do not have one) for your aftercare needs so that they can reassess your need for medications and monitor your lab values.  Discharge Instructions   Diet - low sodium heart healthy    Complete by:  As directed      Diet Carb Modified    Complete by:  As directed      Increase activity slowly    Complete by:  As directed           Current Discharge Medication List    START taking these medications   Details  ciprofloxacin (CIPRO) 500 MG tablet Take 1 tablet (500 mg total) by mouth 2 (two) times daily. For 4 days Qty: 8 tablet, Refills: 0    metroNIDAZOLE (FLAGYL) 500 MG tablet Take 1 tablet (500 mg total) by mouth every 8 (eight) hours. For 4 days Qty: 12 tablet, Refills: 0    oxyCODONE (OXY IR/ROXICODONE) 5 MG immediate release tablet Take 1 tablet (5 mg total) by mouth every 6 (six) hours as needed for moderate pain. Qty: 30 tablet, Refills: 0    polyethylene glycol (MIRALAX / GLYCOLAX) packet Take 17 g by mouth daily as needed. Qty: 14 each, Refills: 0      CONTINUE these medications which have CHANGED   Details  amLODipine (NORVASC) 5 MG tablet Take 1 tablet (5 mg total) by mouth daily. Qty: 30 tablet, Refills: 0    clopidogrel (PLAVIX) 75  MG tablet Take 1 tablet (75 mg total) by mouth daily. Qty: 30 tablet, Refills: 0    ezetimibe (ZETIA) 10 MG tablet Take 1 tablet (10 mg total) by mouth daily. Qty: 30 tablet, Refills: 0    metoprolol succinate (TOPROL-XL) 25 MG 24 hr tablet Take 1 tablet (25 mg total) by mouth daily. Qty: 30 tablet, Refills: 0      CONTINUE these medications which have NOT CHANGED   Details  acetaminophen (TYLENOL) 500 MG tablet Take 1,000 mg by mouth once.    aspirin 81 MG tablet Take 81 mg by mouth daily.    ferrous sulfate 325 (65 FE) MG tablet Take 325 mg by mouth daily with breakfast.    Multiple Vitamins-Minerals (MULTIVITAMIN WITH MINERALS) tablet Take 1 tablet  by mouth daily.     pioglitazone-metformin (ACTOPLUS MET) 15-500 MG per tablet Take 1 tablet by mouth daily.    glucose blood test strip 1 each by Other route 2 (two) times daily. THIS IS FOR THE TRUE TRACK METERUse as instructed Qty: 100 each, Refills: 12      STOP taking these medications     Choline Fenofibrate (TRILIPIX) 135 MG capsule      pantoprazole (PROTONIX) 40 MG tablet      nitroGLYCERIN (NITROSTAT) 0.4 MG SL tablet        Allergies  Allergen Reactions  . Lisinopril Cough    Rxn: unknown  . Statins Other (See Comments)    Muscle ache  . Welchol [Colesevelam Hcl] Itching   Follow-up Information   Follow up with ZEHR, JESSICA D., PA-C On 08/02/2014. (10 AM follow up with GI physician assistant at Dr Lynne Leader office.)    Specialty:  Gastroenterology   Contact information:   [place] [address] [phone]       Follow up with [name], MD. Schedule an appointment as soon as possible for a visit in 1 week.   Specialty:  Family Medicine   Contact information:   [place] [address] [phone]        The results of significant diagnostics from this hospitalization (including imaging, microbiology, ancillary and laboratory) are listed below for  reference.    Significant Diagnostic Studies: Dg Chest 2 View  07/04/2014   CLINICAL DATA:  New productive cough, congestion, personal history of type 2 diabetes, hypertension, coronary artery disease post CABG, dyslipidemia, abdominal aortic aneurysm, initial encounter  EXAM: CHEST  2 VIEW  COMPARISON:  06/27/2014  FINDINGS: Normal heart size post median sternotomy by history CABG.  Mediastinal contours and pulmonary vascularity normal.  Atherosclerotic calcification.  Minimal elevation of LEFT diaphragm and LEFT basilar atelectasis.  No acute infiltrate, pleural effusion or pneumothorax.  Prior cervical spine fusion.  IMPRESSION: Minimal LEFT basilar atelectasis.   Electronically Signed   By: Lavonia Dana M.D.   On: 07/04/2014 14:41   Dg Chest 2 View  06/27/2014   CLINICAL DATA:  Chest pain, fever, and cough for 3 days  EXAM: CHEST  2 VIEW  COMPARISON:  May 14, 2014  FINDINGS: The heart size and mediastinal contours are stable. Patient status post prior median sternotomy and CABG. Both lungs are clear. The visualized skeletal structures are stable.  IMPRESSION: No active cardiopulmonary disease.   Electronically Signed   By: Abelardo Diesel M.D.   On: 06/27/2014 21:43   Mr 3d Recon At Scanner  06/30/2014   CLINICAL DATA:  Cholelithiasis with elevated liver function studies.  EXAM: MRI ABDOMEN WITHOUT AND WITH CONTRAST (INCLUDING MRCP)  TECHNIQUE: Multiplanar multisequence MR imaging of the abdomen was performed both before and after the administration of intravenous contrast. Heavily T2-weighted images of the biliary and pancreatic ducts were obtained, and three-dimensional MRCP images were rendered by post processing.  CONTRAST:  53m MULTIHANCE GADOBENATE DIMEGLUMINE 529 MG/ML IV SOLN  COMPARISON:  Abdominal ultrasound 06/28/2014. Lumbar myelogram CT 08/02/2012.  FINDINGS: The gallbladder is distended and contains small stones. There is no gallbladder wall thickening or surrounding inflammatory change.  There is intra and extrahepatic biliary dilatation. The common hepatic duct measures up to 12 mm in diameter. There are small stones within the distal common bile duct, best seen on the coronal images.  The pancreatic duct is normal in caliber. There is no  evidence pancreas divisum or surrounding inflammatory change.  There are no suspicious hepatic findings. There is a tiny cyst in the right lobe near the IVC. There are areas of nodular arterial phase enhancement within the liver without abnormality on the portal phase images, consistent with incidental transient hepatic intensity differences.  A 2.8 cm left adrenal nodule is unchanged from the prior myelogram CT, consistent with a benign finding. This may contain a small amount of central fat and may reflect a myelolipoma. The right adrenal gland and spleen appear normal. Tiny renal cysts are present bilaterally. The right kidney is atrophied with cortical thinning. 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 This is most completely visualized in the coronal plane, measuring up to 4.3 cm AP and 4.2 cm transverse. This extends approximately 4.9 cm cephalocaudad small hiatal hernia and postsurgical changes in the lumbar spine are noted.  IMPRESSION: 1. Choledocholithiasis with intra and extrahepatic biliary dilatation. 2. Cholelithiasis without evidence of cholecystitis. 3. No significant pancreatic or hepatic abnormalities. 4. Stable left adrenal nodule consistent with a benign finding. 5. Fusiform aneurysm of the distal aorta. Recommend follow up by [place] in 1year. This recommendation follows ACR consensus guidelines: White Paper of the ACR Incidental Findings Committee II on Vascular Findings. Joellyn Rued [id number]; 38:[id number].   Electronically Signed   By: Camie Patience M.D.   On: 06/30/2014 12:14   US Abdomen Limited  06/28/2014   CLINICAL DATA:  Chest pain  EXAM: US ABDOMEN LIMITED - RIGHT UPPER QUADRANT  COMPARISON:  None.  FINDINGS:  Gallbladder:  There are cholelithiasis without pericholecystic fluid or gallbladder wall thickening. No sonographic Murphy sign noted.  Common bile duct:  Diameter: 9 mm  Liver:  No focal lesion identified. Within normal limits in parenchymal echogenicity. There is mild intrahepatic biliary ductal dilatation.  IMPRESSION: Cholelithiasis without sonographic evidence of acute cholecystitis. There is dilatation of the common bile duct and intrahepatic bile ducts without choledocholithiasis. The common bile duct to the level of the pancreatic head is not well visualized. If there is further clinical concern, further evaluation with MRCP is recommended.   Electronically Signed   By: Kathreen Devoid   On: 06/28/2014 21:24   Dg Ercp  07/05/2014   CLINICAL DATA:  Biliary stent obstruction. Removal of existing biliary stent and placement of new stent. Patient with pancreatitis, jaundice and fever prior to the procedure.  EXAM: ERCP  TECHNIQUE: Multiple spot images obtained with the fluoroscopic device and submitted for interpretation post-procedure.  COMPARISON:  MRCP 06/30/2014  FINDINGS: Examination demonstrates evidence of the pre-existing biliary stent with subsequent cannulation of the common bile duct an opacification of the common bile duct and intrahepatic ducts. There is mild dilatation of the common bile duct and central intrahepatic ducts without definite focal mass, filling defect or stricture. Final image demonstrates a biliary stent which appears in adequate position.  IMPRESSION: Mild dilatation of the common bile duct and central intrahepatic ducts with placement of a biliary stent in adequate position. Recommend correlation with findings at the time of the procedure.  These images were submitted for radiologic interpretation only. Please see the procedural report for the amount of contrast and the fluoroscopy time utilized.   Electronically Signed   By: Marin Olp M.D.   On: 07/05/2014 18:53   Dg Ercp  Biliary & Pancreatic Ducts  07/03/2014   CLINICAL DATA:  ERCP  EXAM: ERCP  TECHNIQUE: Multiple spot images obtained with the fluoroscopic device and submitted  for interpretation post-procedure.  COMPARISON:  None.  FINDINGS: Images demonstrate cannulation of the common bile duct. Contrast fills the biliary tree. A stent has been placed across the ampulla. Filling defects are present in the mid common bile duct.  IMPRESSION: See above.  These images were submitted for radiologic interpretation only. Please see the procedural report for the amount of contrast and the fluoroscopy time utilized.   Electronically Signed   By: Maryclare Bean M.D.   On: 07/03/2014 13:12   Dg Abd 2 Views  07/04/2014   CLINICAL DATA:  Abdominal pain, post ERCP  EXAM: ABDOMEN - 2 VIEW  COMPARISON:  MRCP dated 06/30/2014  FINDINGS: Nonspecific bowel gas pattern, with mildly prominent loops of small bowel in the central abdomen, likely reflecting adynamic small bowel ileus or less likely partial small bowel obstruction.  Moderate left colonic stool burden.  No evidence of free air under the diaphragm on the upright view.  Common duct stent.  Lumbar spine fixation hardware.  IMPRESSION: Mildly prominent loops of small bowel in the central abdomen, likely reflecting adynamic small bowel ileus, less likely partial small bowel obstruction.  Moderate left colonic stool burden.  Common duct stent.  No free air.   Electronically Signed   By: Julian Hy M.D.   On: 07/04/2014 01:01   Mr Abd W/wo Cm/mrcp  06/30/2014   CLINICAL DATA:  Cholelithiasis with elevated liver function studies.  EXAM: MRI ABDOMEN WITHOUT AND WITH CONTRAST (INCLUDING MRCP)  TECHNIQUE: Multiplanar multisequence MR imaging of the abdomen was performed both before and after the administration of intravenous contrast. Heavily T2-weighted images of the biliary and pancreatic ducts were obtained, and three-dimensional MRCP images were rendered by post processing.  CONTRAST:   57m MULTIHANCE GADOBENATE DIMEGLUMINE 529 MG/ML IV SOLN  COMPARISON:  Abdominal ultrasound 06/28/2014. Lumbar myelogram CT 08/02/2012.  FINDINGS: The gallbladder is distended and contains small stones. There is no gallbladder wall thickening or surrounding inflammatory change. There is intra and extrahepatic biliary dilatation. The common hepatic duct measures up to 12 mm in diameter. There are small stones within the distal common bile duct, best seen on the coronal images.  The pancreatic duct is normal in caliber. There is no evidence pancreas divisum or surrounding inflammatory change.  There are no suspicious hepatic findings. There is a tiny cyst in the right lobe near the IVC. There are areas of nodular arterial phase enhancement within the liver without abnormality on the portal phase images, consistent with incidental transient hepatic intensity differences.  A 2.8 cm left adrenal nodule is unchanged from the prior myelogram CT, consistent with a benign finding. This may contain a small amount of central fat and may reflect a myelolipoma. The right adrenal gland and spleen appear normal. Tiny renal cysts are present bilaterally. The right kidney is atrophied with cortical thinning. 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 This is most completely visualized in the coronal plane, measuring up to 4.3 cm AP and 4.2 cm transverse. This extends approximately 4.9 cm cephalocaudad small hiatal hernia and postsurgical changes in the lumbar spine are noted.  IMPRESSION: 1. Choledocholithiasis with intra and extrahepatic biliary dilatation. 2. Cholelithiasis without evidence of cholecystitis. 3. No significant pancreatic or hepatic abnormalities. 4. Stable left adrenal nodule consistent with a benign finding. 5. Fusiform aneurysm of the distal aorta. Recommend follow up by [place] 1year. This recommendation follows ACR consensus guidelines: White Paper of the ACR Incidental Findings Committee II on  Vascular Findings.  Joellyn Rued [id number]; 44:[id number].   Electronically Signed   By: Camie Patience M.D.   On: 06/30/2014 12:14    Microbiology: Recent Results (from the past 240 hour(s))  CULTURE, BLOOD (ROUTINE X 2)     Status: None   Collection Time    07/04/14  9:17 PM      Result Value Ref Range Status   Specimen Description BLOOD RIGHT ARM   Final   Special Requests BOTTLES DRAWN AEROBIC AND ANAEROBIC 10CC   Final   Culture  Setup Time     Final   Value: 07/05/2014 00:50     Performed at [facility]   Culture     Final   Value: NO GROWTH 5 DAYS     Performed at [facility]   Report Status 07/11/2014 FINAL   Final  CULTURE, BLOOD (ROUTINE X 2)     Status: None   Collection Time    07/04/14  9:20 PM      Result Value Ref Range Status   Specimen Description BLOOD LEFT ARM   Final   Special Requests BOTTLES DRAWN AEROBIC AND ANAEROBIC 10CC EACH   Final   Culture  Setup Time     Final   Value: 07/05/2014 00:57     Performed at [facility]   Culture     Final   Value: NO GROWTH 5 DAYS     Performed at [facility]   Report Status 07/11/2014 FINAL   Final     Labs: Basic Metabolic Panel:  Recent Labs Lab 07/07/14 0245 07/08/14 0415 07/09/14 0441 07/10/14 0413 07/11/14 0251  NA 131* 131* 131* 130* 131*  K 4.8 3.9 3.9 3.9 4.6  CL 98 96 95* 93* 94*  CO2 18* _0 GLUCOSE 160* 181* 192* 211* 230*  BUN 26* _1 CREATININE 1.15 1.11 1.05 1.13 1.32  CALCIUM 8.7 9.1 8.9 8.9 8.8   Liver Function Tests:  Recent Labs Lab 07/07/14 0245 07/08/14 0415 07/09/14 0441 07/10/14 0413 07/11/14 0251  AST 41* _2 ALT 58* 45 _3 ALKPHOS 179* 158* 125* 118* 104  BILITOT 4.5* 3.3* 2.3* 2.0* 1.6*  PROT 6.4 6.7 6.2 6.2 6.1  ALBUMIN 2.1* 2.2* 2.0* 1.9* 1.9*    Recent Labs Lab 07/05/14 0500 07/07/14 0245 07/10/14 0413  LIPASE 1873* 281* 107*   No results found for this basename: AMMONIA,  in the last 168  hours CBC:  Recent Labs Lab 07/07/14 0245 07/08/14 0415 07/09/14 0441 07/10/14 0413 07/11/14 0251  WBC 10.8* 15.1* 15.8* 15.4* 12.7*  HGB 10.1* 10.1* 9.1* 9.0* 8.9*  HCT 28.9* 28.9* 26.5* 25.6* 26.0*  MCV 87.8 87.8 86.6 87.4 87.8  PLT 295 334 317 343 364   Cardiac Enzymes: No results found for this basename: CKTOTAL, CKMB, CKMBINDEX, TROPONINI,  in the last 168 hours BNP: BNP (last 3 results)  Recent Labs  06/27/14 1954 07/04/14 1300  PROBNP 333.0 397.2   CBG:  Recent Labs Lab 07/10/14 0800 07/10/14 1140 07/10/14 1645 07/10/14 2026 07/11/14 0728  GLUCAP 200* 251* 112* 261* 235*       Signed:  Jolana Runkles  [facility] 07/11/2014, 11:27 AM

## 2014-07-11 NOTE — Progress Notes (Signed)
Inpatient Diabetes Program Recommendations  AACE/ADA: New Consensus Statement on Inpatient Glycemic Control (2013)  Target Ranges:  Prepandial:   less than 140 mg/dL      Peak postprandial:   less than 180 mg/dL (1-2 hours)      Critically ill patients:  140 - 180 mg/dL   Results for [name], [name] (MRN [id number]) as of 07/11/2014 13:06  Ref. Range 07/10/2014 08:00 07/10/2014 11:40 07/10/2014 16:45 07/10/2014 20:26 07/11/2014 07:28 07/11/2014 11:25  Glucose-Capillary Latest Range: 70-99 mg/dL 200 (H) 251 (H) 112 (H) 261 (H) 235 (H) 282 (H)   Diabetes history: DM2 Outpatient Diabetes medications: ActoPlus Met 15-500 mg daily Current orders for Inpatient glycemic control: Novolog 0-15 units AC, Novolog 3 units TID with meals  Inpatient Diabetes Program Recommendations Insulin - Basal: Please consider ordering low dose basal insulin; recommend ordering Levemir 8 units QHS. Correction (SSI): Please consider ordering Novolog bedtime correction scale.  Thanks, Barnie Alderman, RN, MSN, CCRN Diabetes Coordinator Inpatient Diabetes Program [phone] (Team Pager) [phone] ([facility] office) [phone] [facility] office)

## 2014-07-14 ENCOUNTER — Encounter (HOSPITAL_COMMUNITY): Payer: Self-pay | Admitting: Emergency Medicine

## 2014-07-14 DIAGNOSIS — E785 Hyperlipidemia, unspecified: Secondary | ICD-10-CM | POA: Insufficient documentation

## 2014-07-14 DIAGNOSIS — R1011 Right upper quadrant pain: Secondary | ICD-10-CM | POA: Diagnosis not present

## 2014-07-14 DIAGNOSIS — Z87891 Personal history of nicotine dependence: Secondary | ICD-10-CM | POA: Diagnosis not present

## 2014-07-14 DIAGNOSIS — Z794 Long term (current) use of insulin: Secondary | ICD-10-CM | POA: Insufficient documentation

## 2014-07-14 DIAGNOSIS — Z9889 Other specified postprocedural states: Secondary | ICD-10-CM | POA: Diagnosis not present

## 2014-07-14 DIAGNOSIS — Z7982 Long term (current) use of aspirin: Secondary | ICD-10-CM | POA: Diagnosis not present

## 2014-07-14 DIAGNOSIS — I2510 Atherosclerotic heart disease of native coronary artery without angina pectoris: Secondary | ICD-10-CM | POA: Insufficient documentation

## 2014-07-14 DIAGNOSIS — Z7902 Long term (current) use of antithrombotics/antiplatelets: Secondary | ICD-10-CM | POA: Insufficient documentation

## 2014-07-14 DIAGNOSIS — I252 Old myocardial infarction: Secondary | ICD-10-CM | POA: Insufficient documentation

## 2014-07-14 DIAGNOSIS — E860 Dehydration: Secondary | ICD-10-CM | POA: Diagnosis not present

## 2014-07-14 DIAGNOSIS — E1165 Type 2 diabetes mellitus with hyperglycemia: Secondary | ICD-10-CM | POA: Insufficient documentation

## 2014-07-14 DIAGNOSIS — I10 Essential (primary) hypertension: Secondary | ICD-10-CM | POA: Insufficient documentation

## 2014-07-14 DIAGNOSIS — Z87448 Personal history of other diseases of urinary system: Secondary | ICD-10-CM | POA: Diagnosis not present

## 2014-07-14 DIAGNOSIS — Z79899 Other long term (current) drug therapy: Secondary | ICD-10-CM | POA: Diagnosis not present

## 2014-07-14 DIAGNOSIS — Z8701 Personal history of pneumonia (recurrent): Secondary | ICD-10-CM | POA: Diagnosis not present

## 2014-07-14 DIAGNOSIS — M170 Bilateral primary osteoarthritis of knee: Secondary | ICD-10-CM | POA: Insufficient documentation

## 2014-07-14 DIAGNOSIS — Z792 Long term (current) use of antibiotics: Secondary | ICD-10-CM | POA: Diagnosis not present

## 2014-07-14 DIAGNOSIS — D649 Anemia, unspecified: Secondary | ICD-10-CM | POA: Insufficient documentation

## 2014-07-14 DIAGNOSIS — Z9861 Coronary angioplasty status: Secondary | ICD-10-CM | POA: Diagnosis not present

## 2014-07-14 DIAGNOSIS — Z951 Presence of aortocoronary bypass graft: Secondary | ICD-10-CM | POA: Diagnosis not present

## 2014-07-14 DIAGNOSIS — G8929 Other chronic pain: Secondary | ICD-10-CM | POA: Diagnosis not present

## 2014-07-14 DIAGNOSIS — E871 Hypo-osmolality and hyponatremia: Secondary | ICD-10-CM | POA: Insufficient documentation

## 2014-07-14 DIAGNOSIS — Z8719 Personal history of other diseases of the digestive system: Secondary | ICD-10-CM | POA: Diagnosis not present

## 2014-07-14 DIAGNOSIS — R10811 Right upper quadrant abdominal tenderness: Secondary | ICD-10-CM | POA: Insufficient documentation

## 2014-07-14 LAB — CBC WITH DIFFERENTIAL/PLATELET
BASOS PCT: 0 % (ref 0–1)
Basophils Absolute: 0 10*3/uL (ref 0.0–0.1)
Eosinophils Absolute: 0 10*3/uL (ref 0.0–0.7)
Eosinophils Relative: 0 % (ref 0–5)
HCT: 31 % — ABNORMAL LOW (ref 39.0–52.0)
HEMOGLOBIN: 10.5 g/dL — AB (ref 13.0–17.0)
LYMPHS PCT: 15 % (ref 12–46)
Lymphs Abs: 1.5 10*3/uL (ref 0.7–4.0)
MCH: 30.4 pg (ref 26.0–34.0)
MCHC: 33.9 g/dL (ref 30.0–36.0)
MCV: 89.9 fL (ref 78.0–100.0)
MONO ABS: 0.5 10*3/uL (ref 0.1–1.0)
MONOS PCT: 5 % (ref 3–12)
NEUTROS ABS: 7.9 10*3/uL — AB (ref 1.7–7.7)
NEUTROS PCT: 80 % — AB (ref 43–77)
Platelets: 457 10*3/uL — ABNORMAL HIGH (ref 150–400)
RBC: 3.45 MIL/uL — ABNORMAL LOW (ref 4.22–5.81)
RDW: 13.6 % (ref 11.5–15.5)
WBC: 9.9 10*3/uL (ref 4.0–10.5)

## 2014-07-14 LAB — COMPREHENSIVE METABOLIC PANEL
ALBUMIN: 2.5 g/dL — AB (ref 3.5–5.2)
ALK PHOS: 95 U/L (ref 39–117)
ALT: 22 U/L (ref 0–53)
AST: 21 U/L (ref 0–37)
Anion gap: 16 — ABNORMAL HIGH (ref 5–15)
BILIRUBIN TOTAL: 1.3 mg/dL — AB (ref 0.3–1.2)
BUN: 23 mg/dL (ref 6–23)
CHLORIDE: 91 meq/L — AB (ref 96–112)
CO2: 21 meq/L (ref 19–32)
CREATININE: 1.47 mg/dL — AB (ref 0.50–1.35)
Calcium: 9.6 mg/dL (ref 8.4–10.5)
GFR calc Af Amer: 52 mL/min — ABNORMAL LOW (ref >90)
GFR, EST NON AFRICAN AMERICAN: 45 mL/min — AB (ref >90)
Glucose, Bld: 305 mg/dL — ABNORMAL HIGH (ref 70–99)
POTASSIUM: 4.5 meq/L (ref 3.7–5.3)
Sodium: 128 mEq/L — ABNORMAL LOW (ref 137–147)
Total Protein: 7.2 g/dL (ref 6.0–8.3)

## 2014-07-14 LAB — CBG MONITORING, ED: GLUCOSE-CAPILLARY: 299 mg/dL — AB (ref 70–99)

## 2014-07-15 ENCOUNTER — Emergency Department (HOSPITAL_COMMUNITY)
Admission: EM | Admit: 2014-07-15 | Discharge: 2014-07-15 | Disposition: A | Discharge status: Home / Self Care | Payer: Medicare Other | Attending: Emergency Medicine | Admitting: Emergency Medicine

## 2014-07-15 DIAGNOSIS — R739 Hyperglycemia, unspecified: Secondary | ICD-10-CM

## 2014-07-15 DIAGNOSIS — E1165 Type 2 diabetes mellitus with hyperglycemia: Secondary | ICD-10-CM | POA: Diagnosis not present

## 2014-07-15 LAB — URINALYSIS, ROUTINE W REFLEX MICROSCOPIC
BILIRUBIN URINE: NEGATIVE
Glucose, UA: 250 mg/dL — AB
Hgb urine dipstick: NEGATIVE
KETONES UR: NEGATIVE mg/dL
Nitrite: NEGATIVE
PH: 5 (ref 5.0–8.0)
PROTEIN: NEGATIVE mg/dL
Specific Gravity, Urine: 1.017 (ref 1.005–1.030)
Urobilinogen, UA: 0.2 mg/dL (ref 0.0–1.0)

## 2014-07-15 LAB — CBG MONITORING, ED: GLUCOSE-CAPILLARY: 256 mg/dL — AB (ref 70–99)

## 2014-07-15 LAB — URINE MICROSCOPIC-ADD ON

## 2014-07-15 MED ORDER — INSULIN DETEMIR 100 UNIT/ML ~~LOC~~ SOLN
10.0000 [IU] | Freq: Once | SUBCUTANEOUS | Status: AC
  Administered 2014-07-15: 10 [IU] via SUBCUTANEOUS
  Filled 2014-07-15: qty 0.1

## 2014-07-15 MED ORDER — INSULIN DETEMIR 100 UNIT/ML ~~LOC~~ SOLN: 10.0000 [IU] | Freq: Every day | SUBCUTANEOUS | Status: DC

## 2014-07-15 MED ORDER — SODIUM CHLORIDE 0.9 % IV BOLUS (SEPSIS)
1000.0000 mL | Freq: Once | INTRAVENOUS | Status: AC
  Administered 2014-07-15: 1000 mL via INTRAVENOUS

## 2014-07-15 NOTE — ED Provider Notes (Signed)
CSN: [id number]     Arrival date & time 07/14/14  2218 History   First MD Initiated Contact with Patient 07/15/14 0108     Chief Complaint  Patient presents with  . Hyperglycemia     (Consider location/radiation/quality/duration/timing/severity/associated sxs/prior Treatment) HPI  This is a 78 yo male with recent complicated past medical history including cardiac stenting, and acute cholangitis with pancreatitis requiring biliary stenting. Patient was discharged on Thursday. He states that since that time he has had persistent hyperglycemia. The wife notes that prior to arrival his blood glucose at home was 578 at 7:15 PM and 41 at 9:35 PM. He called his primary physician who recommended he come to the emergency room. Patient is currently on Cipro and Flagyl as an outpatient. He denies any chest pain or shortness of breath. He reports persistent right upper quadrant pain which is unchanged from admission. He denies any vomiting or diarrhea. He only takes metformin for his diabetes.  Past Medical History  Diagnosis Date  . History of: ST elevation myocardial infarction (STEMI) involving left circumflex coronary artery with complication 0254    PTCA-circumflex; PCI in 1991  . CAD, multiple vessel 1985-2010    Most recent cath 01/01/09: 100% Occluded LAD after SP1, patent LIMA-distal LAD with apical 90% lesion.  Cx-OM1 widely patent stent into proximal bifurcating OM1 . Follow-on Cx => 70-80% -- non-amenable PCI. Widely patent 3 overlapping stents in midRCA with only 40% RPL stenosis; SVG- OM known to be occluded.  . S/P CABG x 2 1997    LIMA-LAD, SVG-OM  . H/O unstable angina 06/2003, 12/2006, 05/2014    a) '04: Staged Taxus DES PCI to RCA and Cx-OM;;'08 - PCI to proximal ISR in RCA - Cypher DES; c) 05/2014: ISR in RCA & OM1, PCI with Promus P DES: RCA  2.75 x 12, OM1 3.0 mm x 8)  . History of nuclear stress test December 2013    LOW at Risk. Moderate region of mid to basal inferolateral scar  without ischemia -- consistent with distal circumflex disease. Mild apical hypokinesis with an EF of 47%.  . Irregular heart beat     PVCs and PACs, no arrhythmia recorded  . Exertional dyspnea     Chronic baseline SOB with ambulation  . Sleep apnea     pt. states he was told to return for f/u, to be fitted for CPAp, but pt. reports that he didn't follow up  . AAA (abdominal aortic aneurysm) 08/30/12    Doppler 08/30/12 had a small amount of growth. It was a 4.2 x 4.3 cm greater in size than the previous one noted at 4 x 3.8.  . Diabetes mellitus     Not on oral medication  . Hypertension   . Dyslipidemia, goal LDL below 70   . Osteoarthritis of both knees     And back; multiple back surgeries, right knee arthroplasty and left knee arthroscopic surgery x2  . Chronic back pain      multiple surgeries; C-spine and lumbar  . Erectile dysfunction   . H/O: pneumonia February 14  . Chronic anemia     On iron supplement; history of positive guaiac - negative colonoscopy in 1996.; Thought to be related to hemorrhoids; status post hemorrhoidectomy  . Ankle edema     Chronic  . Diverticulitis of colon 1996   Past Surgical History  Procedure Laterality Date  . Coronary angioplasty  2706,2376    1985 lateral STEMI Circumflex PTCA  . Anterior  lat lumbar fusion Left 11/22/2012    Procedure: ANTERIOR LATERAL LUMBAR FUSION 1 LEVEL;  Surgeon: Eustace Moore, MD;  Location: [facility];  Service: Neurosurgery;  Laterality: Left;  Anterior Lateral Lumbar Fusion Lumbar Three-Four  . Lumbar percutaneous pedicle screw 1 level N/A 11/22/2012    Procedure: LUMBAR PERCUTANEOUS PEDICLE SCREW 1 LEVEL;  Surgeon: Eustace Moore, MD;  Location: [facility];  Service: Neurosurgery;  Laterality: N/A;  Lumbar Three-Four Percutaneous Pedicle Screw, Lateral approach  . Back surgery  1979 & x 10    pt. remarks, "I have had about 10 back surgeries"  . Posterior cervical fusion/foraminotomy N/A 05/02/2013    Procedure:  POSTERIOR CERVICAL FUSION/FORAMINOTOMY CERVICAL SEVEN THORACIC-ONE;  Surgeon: Eustace Moore, MD;  Location: [facility];  Service: Neurosurgery;  Laterality: N/A;  POSTERIOR CERVICAL FUSION/FORAMINOTOMY CERVICAL SEVEN THORACIC-ONE  . Coronary artery bypass graft  1990    LIMA-LAD, SVG-OM  . Cardiac catheterization  September 2004    None Occluded vein graft to OM; diffuse RCA disease in the mid vessel, 80% circumflex-OM stenosis; follow on AV groove circumflex with sequential 90% stenoses and intervening saccular dilation   . Percutaneous coronary stent intervention (pci-s)  September 2004    PCI - RCA 2 overlapping Taxus DES 2.75 mm x 32 mm and 2.75 mm x 12 mm (3.0 mm); PCI-Cx-OM1 - Taxus DES 3.0 mm x 20 mm (3.1 mm);   Marland Kitchen Percutaneous coronary stent intervention (pci-s)  March 2008    80% ISR in proximal Taxus stent in RCA -- covered proximally with Cypher DES 3.0 mm x 12 mm  . Cardiac catheterization  March 2010    4 abnormal Myoview showing apical thinning (possibly due to apical LAD 95%) : 100% Occluded LAD after SV1, distal LAD grafted via LIMA -apical 95% . Cx -OM1 w/patent stent extending into OM 1 . Follow on Cx - 70-80% - non-amenable PCI. RCA widely patent 3 overlapping stents in mRCA w/less than 40% stenosisin RPL; SVG-OM known occluded   . Cervical discectomy  04/23/10    Decompressive anterior carvical diskectomy. C4-5, C6-7  . Cervical arthrodesis  04/23/10    Anterior cervical arthrodesis, C4-5, C6-7 utilizing 7-mm PEEK interbody cage packed with local autograft & Antifuse putty at C4-5 & an 8-mm cage at C6-7.  Marland Kitchen Anterior cervical plating  04/23/10    At C4-5 and a C6-7 utilizing two separate Biomet MaxAn plates.  . Colonoscopy  1996  . Minor hemorrhoidectomy    . Total knee arthroplasty Right   . Knee arthroscopy Left     x 2  . Nm myoview ltd  December 2013    LOW RISK. Mmoderate region of mid to basal inferolateral scar without ischemia. Mild apical hypokinesis with an EF of  47%.  . Transthoracic echocardiogram  December 2013    EF 55-60%. moderate LA dilation. Aortic Sclerosis  . Abdominal and lower extremity arterial ultrasound  08/23/2012; 10/10/2013    Normal ABIs. Nonocclusive lower extremity disease. 4.2 cm x 4.3 cm infrarenal AAA;; 4.4 cm x 4.3 cm (essentially stable)   . Cataract extraction    . Ercp N/A 07/03/2014    Procedure: ENDOSCOPIC RETROGRADE CHOLANGIOPANCREATOGRAPHY (ERCP);  Surgeon: Inda Castle, MD;  Location: [facility];  Service: Endoscopy;  Laterality: N/A;  . Biliary stent placement N/A 07/03/2014    Procedure: BILIARY STENT PLACEMENT;  Surgeon: Inda Castle, MD;  Location: [facility];  Service: Endoscopy;  Laterality: N/A;  . Ercp N/A 07/05/2014  Procedure: ENDOSCOPIC RETROGRADE CHOLANGIOPANCREATOGRAPHY (ERCP);  Surgeon: Inda Castle, MD;  Location: [facility];  Service: Endoscopy;  Laterality: N/A;   Family History  Problem Relation Age of Onset  . Arthritis Mother   . Diabetes Father   . Heart disease Father   . Stroke Sister   . Hypertension Sister   . Heart disease Sister   . Diabetes Sister   . Breast cancer Sister   . Heart disease Brother   . Ulcers Brother   . Colon cancer Neg Hx    History  Substance Use Topics  . Smoking status: Former Smoker    Types: Cigarettes    Quit date: 10/05/1983  . Smokeless tobacco: Never Used  . Alcohol Use: 1.8 oz/week    3 Cans of beer per week     Comment: almost every day    Review of Systems  Constitutional: Negative.  Negative for fever.  Respiratory: Negative.  Negative for chest tightness and shortness of breath.   Cardiovascular: Negative.  Negative for chest pain.  Gastrointestinal: Positive for abdominal pain. Negative for nausea, vomiting and diarrhea.  Genitourinary: Negative.  Negative for dysuria.  Skin: Negative for rash.  Neurological: Negative for headaches.  Psychiatric/Behavioral: Negative for confusion.  All other systems reviewed and are  negative.     Allergies  Lisinopril; Statins; and Welchol  Home Medications   Prior to Admission medications   Medication Sig Start Date End Date Taking? Authorizing Provider  amLODipine (NORVASC) 5 MG tablet Take 1 tablet (5 mg total) by mouth daily. 07/11/14  Yes Domenic Polite, MD  aspirin 81 MG tablet Take 81 mg by mouth daily.   Yes Historical Provider, MD  Choline Fenofibrate (FENOFIBRIC ACID) 135 MG CPDR Take 1 tablet by mouth daily.   Yes Historical Provider, MD  ciprofloxacin (CIPRO) 500 MG tablet Take 1 tablet (500 mg total) by mouth 2 (two) times daily. For 4 days 07/11/14  Yes Domenic Polite, MD  clopidogrel (PLAVIX) 75 MG tablet Take 1 tablet (75 mg total) by mouth daily. 07/11/14  Yes Domenic Polite, MD  ezetimibe (ZETIA) 10 MG tablet Take 1 tablet (10 mg total) by mouth daily. 07/11/14  Yes Domenic Polite, MD  ferrous sulfate 325 (65 FE) MG tablet Take 325 mg by mouth daily with breakfast.   Yes Historical Provider, MD  isosorbide mononitrate (IMDUR) 30 MG 24 hr tablet Take 30 mg by mouth daily.   Yes Historical Provider, MD  metoprolol succinate (TOPROL-XL) 25 MG 24 hr tablet Take 1 tablet (25 mg total) by mouth daily. 07/11/14  Yes Domenic Polite, MD  metroNIDAZOLE (FLAGYL) 500 MG tablet Take 1 tablet (500 mg total) by mouth every 8 (eight) hours. For 4 days 07/11/14  Yes Domenic Polite, MD  Multiple Vitamins-Minerals (MULTIVITAMIN WITH MINERALS) tablet Take 1 tablet by mouth daily.    Yes Historical Provider, MD  oxyCODONE (OXY IR/ROXICODONE) 5 MG immediate release tablet Take 1 tablet (5 mg total) by mouth every 6 (six) hours as needed for moderate pain. 07/11/14  Yes Domenic Polite, MD  pantoprazole (PROTONIX) 40 MG tablet Take 40 mg by mouth daily.   Yes Historical Provider, MD  pioglitazone-metformin (ACTOPLUS MET) 15-500 MG per tablet Take 1 tablet by mouth daily.   Yes Historical Provider, MD  glucose blood test strip 1 each by Other route 2 (two) times daily. THIS IS FOR  THE TRUE TRACK METERUse as instructed 06/25/14   Denita Lung, MD  insulin detemir (LEVEMIR) 100 UNIT/ML  injection Inject 0.1 mLs (10 Units total) into the skin at bedtime. 07/15/14   Merryl Hacker, MD   BP 121/67  Pulse 60  Temp(Src) 97.8 F (36.6 C) (Oral)  Resp 16  Ht '5\' 9"'  (1.753 m)  Wt 193 lb (87.544 kg)  BMI 28.49 kg/m2  SpO2 99% Physical Exam  Nursing note and vitals reviewed. Constitutional: He is oriented to person, place, and time. No distress.  Chronically ill-appearing, no acute distress  HENT:  Head: Normocephalic and atraumatic.  Mucous membranes dry  Eyes: Pupils are equal, round, and reactive to light.  Cardiovascular: Normal rate, regular rhythm and normal heart sounds.   No murmur heard. Pulmonary/Chest: Effort normal and breath sounds normal. No respiratory distress. He has no wheezes.  Well-healed midline sternotomy scar  Abdominal: Soft. Bowel sounds are normal. There is tenderness. There is no rebound.  Right upper quadrant tenderness to palpation without rebound or guarding  Musculoskeletal: He exhibits no edema.  Neurological: He is alert and oriented to person, place, and time.  Skin: Skin is warm and dry.  Psychiatric: He has a normal mood and affect.    ED Course  Procedures (including critical care time) Labs Review Labs Reviewed  CBC WITH DIFFERENTIAL - Abnormal; Notable for the following:    RBC 3.45 (*)    Hemoglobin 10.5 (*)    HCT 31.0 (*)    Platelets 457 (*)    Neutrophils Relative % 80 (*)    Neutro Abs 7.9 (*)    All other components within normal limits  COMPREHENSIVE METABOLIC PANEL - Abnormal; Notable for the following:    Sodium 128 (*)    Chloride 91 (*)    Glucose, Bld 305 (*)    Creatinine, Ser 1.47 (*)    Albumin 2.5 (*)    Total Bilirubin 1.3 (*)    GFR calc non Af Amer 45 (*)    GFR calc Af Amer 52 (*)    Anion gap 16 (*)    All other components within normal limits  URINALYSIS, ROUTINE W REFLEX MICROSCOPIC -  Abnormal; Notable for the following:    APPearance CLOUDY (*)    Glucose, UA 250 (*)    Leukocytes, UA SMALL (*)    All other components within normal limits  URINE MICROSCOPIC-ADD ON - Abnormal; Notable for the following:    Squamous Epithelial / LPF FEW (*)    Bacteria, UA FEW (*)    Casts HYALINE CASTS (*)    All other components within normal limits  CBG MONITORING, ED - Abnormal; Notable for the following:    Glucose-Capillary 299 (*)    All other components within normal limits  CBG MONITORING, ED - Abnormal; Notable for the following:    Glucose-Capillary 256 (*)    All other components within normal limits    Imaging Review No results found.   EKG Interpretation   Date/Time:  Monday July 15 2014 02:31:34 EDT Ventricular Rate:  57 PR Interval:  205 QRS Duration: 122 QT Interval:  462 QTC Calculation: 450 R Axis:   3 Text Interpretation:  Sinus rhythm Ventricular premature complex Probable  inferior infarct, old Probable lateral infarct, old Confirmed by HORTON   MD, [place] ([id number]) on 07/15/2014 7:20:33 AM      MDM   Final diagnoses:  Hyperglycemia    Patient presents with hyperglycemia from home. Recent complicated medical course including acute cholangitis status post biliary stenting and PCI in August.  Initial blood glucose here  to 99. Basic labwork obtained. EKG is largely unchanged from prior. Liver function tests her baseline. Patient is mildly hyponatremic which is likely pseudohyponatremia given hyperglycemia.  Anion gap of 16. Patient was given 1 L of fluids. Echo shows normal EF.  Discussed patient with his primary physician. Patient was on Levemir while in the hospital. Will administer 10 units of Levemir and now and placed on Levemir each bedtime. Primary physician like to followup with patient on Tuesday. He is to monitor his blood glucoses 3 times daily. Patient and his wife stated understanding.  After history, exam, and medical workup I feel  the patient has been appropriately medically screened and is safe for discharge home. Pertinent diagnoses were discussed with the patient. Patient was given return precautions.     Merryl Hacker, MD 07/15/14 [phone]

## 2014-07-15 NOTE — Discharge Instructions (Signed)
Hyperglycemia You were seen today for hyperglycemia. He will be started on insulin at bedtime. You should take her blood sugars 3 times a day and keep a lot. You should followup with her primary Dr. on Tuesday. Hyperglycemia occurs when the glucose (sugar) in your blood is too high. Hyperglycemia can happen for many reasons, but it most often happens to people who do not know they have diabetes or are not managing their diabetes properly.  CAUSES  Whether you have diabetes or not, there are other causes of hyperglycemia. Hyperglycemia can occur when you have diabetes, but it can also occur in other situations that you might not be as aware of, such as: Diabetes  If you have diabetes and are having problems controlling your blood glucose, hyperglycemia could occur because of some of the following reasons:  Not following your meal plan.  Not taking your diabetes medications or not taking it properly.  Exercising less or doing less activity than you normally do.  Being sick. Pre-diabetes  This cannot be ignored. Before people develop Type 2 diabetes, they almost always have "pre-diabetes." This is when your blood glucose levels are higher than normal, but not yet high enough to be diagnosed as diabetes. Research has shown that some long-term damage to the body, especially the heart and circulatory system, may already be occurring during pre-diabetes. If you take action to manage your blood glucose when you have pre-diabetes, you may delay or prevent Type 2 diabetes from developing. Stress  If you have diabetes, you may be "diet" controlled or on oral medications or insulin to control your diabetes. However, you may find that your blood glucose is higher than usual in the hospital whether you have diabetes or not. This is often referred to as "stress hyperglycemia." Stress can elevate your blood glucose. This happens because of hormones put out by the body during times of stress. If stress has been  the cause of your high blood glucose, it can be followed regularly by your caregiver. That way he/she can make sure your hyperglycemia does not continue to get worse or progress to diabetes. Steroids  Steroids are medications that act on the infection fighting system (immune system) to block inflammation or infection. One side effect can be a rise in blood glucose. Most people can produce enough extra insulin to allow for this rise, but for those who cannot, steroids make blood glucose levels go even higher. It is not unusual for steroid treatments to "uncover" diabetes that is developing. It is not always possible to determine if the hyperglycemia will go away after the steroids are stopped. A special blood test called an A1c is sometimes done to determine if your blood glucose was elevated before the steroids were started. SYMPTOMS  Thirsty.  Frequent urination.  Dry mouth.  Blurred vision.  Tired or fatigue.  Weakness.  Sleepy.  Tingling in feet or leg. DIAGNOSIS  Diagnosis is made by monitoring blood glucose in one or all of the following ways:  A1c test. This is a chemical found in your blood.  Fingerstick blood glucose monitoring.  Laboratory results. TREATMENT  First, knowing the cause of the hyperglycemia is important before the hyperglycemia can be treated. Treatment may include, but is not be limited to:  Education.  Change or adjustment in medications.  Change or adjustment in meal plan.  Treatment for an illness, infection, etc.  More frequent blood glucose monitoring.  Change in exercise plan.  Decreasing or stopping steroids.  Lifestyle changes.  HOME CARE INSTRUCTIONS   Test your blood glucose as directed.  Exercise regularly. Your caregiver will give you instructions about exercise. Pre-diabetes or diabetes which comes on with stress is helped by exercising.  Eat wholesome, balanced meals. Eat often and at regular, fixed times. Your caregiver or  nutritionist will give you a meal plan to guide your sugar intake.  Being at an ideal weight is important. If needed, losing as little as 10 to 15 pounds may help improve blood glucose levels. SEEK MEDICAL CARE IF:   You have questions about medicine, activity, or diet.  You continue to have symptoms (problems such as increased thirst, urination, or weight gain). SEEK IMMEDIATE MEDICAL CARE IF:   You are vomiting or have diarrhea.  Your breath smells fruity.  You are breathing faster or slower.  You are very sleepy or incoherent.  You have numbness, tingling, or pain in your feet or hands.  You have chest pain.  Your symptoms get worse even though you have been following your caregiver's orders.  If you have any other questions or concerns. Document Released: 03/16/2001 Document Revised: 12/13/2011 Document Reviewed: 01/17/2012 [facility] Patient Information 2015 [place], [place]. This information is not intended to replace advice given to you by your health care provider. Make sure you discuss any questions you have with your health care provider.

## 2014-07-16 ENCOUNTER — Ambulatory Visit (INDEPENDENT_AMBULATORY_CARE_PROVIDER_SITE_OTHER): Payer: Medicare Other | Admitting: Family Medicine

## 2014-07-16 ENCOUNTER — Encounter: Payer: Self-pay | Admitting: Family Medicine

## 2014-07-16 VITALS — BP 110/60 | HR 70 | Wt 190.0 lb

## 2014-07-16 DIAGNOSIS — I200 Unstable angina: Secondary | ICD-10-CM | POA: Diagnosis not present

## 2014-07-16 DIAGNOSIS — E118 Type 2 diabetes mellitus with unspecified complications: Secondary | ICD-10-CM

## 2014-07-16 NOTE — Progress Notes (Signed)
   Subjective:    Patient ID: [name], male    DOB: 1936-08-22, 78 y.o.   MRN: [id number]  HPI He is here for posthospitalization followup. He was treated for gallbladder disease and did have ERCP to remove the stones. He was placed on antibiotics. In the hospital he was given insulin. He was sent home on his regular diabetes medications however his blood sugar continued to go up. I was called and recommended going to the emergency room. They stated his blood sugar was in the 300 range. He was given Lantus insulin 10 units. Yesterday his blood sugar ranged in the 300s. This morning he was 267 over this afternoon he went into the 500 range. He feels quite fatigued but no fever, chills, nausea, vomiting   Review of Systems     Objective:   Physical Exam Alert and quite fatigued appearing. Cardiac exam shows regular rhythm without murmurs or gallops. Lungs clear to auscultation. Abdominal exam shows decreased bowel sounds without masses or tenderness.       Assessment & Plan:  Type II diabetes mellitus with complication -- CAD, AAA  given 10 units of regular insulin. They are to call me tonight with a blood sugar reading. I might possibly give another 10 units of regular tonight. He will also increase his Levemir to 16 units. I will follow this closely. Discussed getting his blood sugar in the morning down between 90 and 120.

## 2014-07-16 NOTE — Patient Instructions (Signed)
Call me tonight and let me know what his blood sugar is. Go ahead and give him the next dose of Levemir at 16 units

## 2014-07-17 ENCOUNTER — Telehealth: Payer: Self-pay

## 2014-07-17 NOTE — Telephone Encounter (Signed)
Increase to 16 units and call in the morning.

## 2014-07-17 NOTE — Telephone Encounter (Signed)
I HAVE TALKED WITH DOT PER JCL TO HAVE HIM TAKE 10 UNITS OF NOVALOG BECAUSE B/S WAS 402 AND TO TAKE 16 UNITS TONIGHT OF [place] CALL BACK TOMORROW  LET us KNOW WHAT B/S ARE

## 2014-07-17 NOTE — Telephone Encounter (Signed)
DR.LALONDE DOT CALLED AND SAID [name] B/S THIS MORNING WAS 249 PLEASE ADVISE

## 2014-07-18 ENCOUNTER — Telehealth: Payer: Self-pay | Admitting: Family Medicine

## 2014-07-18 NOTE — Telephone Encounter (Signed)
Continue on the 16 units and to check the blood sugar in the morning and either before or 2 hours after any of the meals. Have him call tomorrow and then have someone call me with the information.

## 2014-07-18 NOTE — Telephone Encounter (Signed)
DOT WAS INFORMED AND SHE SAID SHE WILL CALL BACK TOMORROW I HAVE MAILED HER A RX FOR TEST STRIPS AND LANCETS FOR THE ONETOUCH VERIO

## 2014-07-19 ENCOUNTER — Telehealth: Payer: Self-pay | Admitting: Internal Medicine

## 2014-07-19 NOTE — Telephone Encounter (Signed)
Pt wife called giving blood sugars  9:30am- 173 Ate lunch at 1:30pm and checked sugar at 2:25pm- 422 (had [place] sandwich,chips and diet drink) gave 10 units of insulin @ 2:32pm  I called Dr. Redmond School and he told me to advise them to increase to 18 units at bedtime and call tomorrow with numbers. Wife was notified of what to do

## 2014-07-25 ENCOUNTER — Encounter: Payer: Self-pay | Admitting: *Deleted

## 2014-07-25 ENCOUNTER — Telehealth: Payer: Self-pay | Admitting: Cardiology

## 2014-07-25 ENCOUNTER — Telehealth: Payer: Self-pay

## 2014-07-25 NOTE — Telephone Encounter (Signed)
Received call from [facility] with Dr.Turner's office.She received letter stating patient needs to stay on plavix.Stated patient needs tooth pulled wanted to ask Dr.Harding if ok to pull tooth while on plavix.

## 2014-07-25 NOTE — Telephone Encounter (Signed)
Provided information documented below to Dr. Theodosia Blender office. Letter composed and faxed to Dr. Radford Pax @ [phone]

## 2014-07-25 NOTE — Telephone Encounter (Signed)
SPOKE TO DR Gretel Acre OFFICE NEED TO KNOW IF PATIENT CAN/OR NEED TO HOLD PLAVIX FOR DENTAL EXTRACTIONS  REVIEWED  WITH luke KILROY PA  PATIENT CAN NOT STOPPED PLAVIX FOR TEETH EXTRACTIONS

## 2014-07-25 NOTE — Telephone Encounter (Signed)
I would prefer that he does not stop Plavix. His stent was just 2 months ago. It would be okay to stop aspirin. He should probably be okay but would have an increased risk of bleeding. I will defer to the dentist if he thinks it is safe on Plavix alone.  Leonie Man, MD

## 2014-07-25 NOTE — Telephone Encounter (Signed)
CALLED NO ANSWER /ANSWERMACHINE -OUT TO LUNCH WILL CALL BACK

## 2014-07-25 NOTE — Telephone Encounter (Signed)
The patient is in the [place] at the dentist office and they are needing to know if he needs to have a extraction today .Marland Kitchen

## 2014-07-26 NOTE — Telephone Encounter (Signed)
Returned call to Ken Caryl with Dr.Turner's office.Office closed left message on personal mailbox Dr.Harding advised patient needs to stay on Plavix his stent was just 2 months ago.Pt okay to stop aspirin.Advised should be okay but would have an increased risk of bleeding.He would defer to the dentist if he thinks it is safe on Plavix alone.

## 2014-08-02 ENCOUNTER — Encounter: Payer: Self-pay | Admitting: Gastroenterology

## 2014-08-02 ENCOUNTER — Other Ambulatory Visit (INDEPENDENT_AMBULATORY_CARE_PROVIDER_SITE_OTHER): Payer: Medicare Other

## 2014-08-02 ENCOUNTER — Telehealth: Payer: Self-pay | Admitting: Family Medicine

## 2014-08-02 ENCOUNTER — Ambulatory Visit (INDEPENDENT_AMBULATORY_CARE_PROVIDER_SITE_OTHER): Payer: Medicare Other | Admitting: Gastroenterology

## 2014-08-02 VITALS — BP 128/64 | HR 76 | Ht 69.0 in | Wt 193.0 lb

## 2014-08-02 DIAGNOSIS — K831 Obstruction of bile duct: Secondary | ICD-10-CM

## 2014-08-02 DIAGNOSIS — K8309 Other cholangitis: Secondary | ICD-10-CM

## 2014-08-02 DIAGNOSIS — K830 Cholangitis: Secondary | ICD-10-CM

## 2014-08-02 DIAGNOSIS — I200 Unstable angina: Secondary | ICD-10-CM

## 2014-08-02 DIAGNOSIS — K8050 Calculus of bile duct without cholangitis or cholecystitis without obstruction: Secondary | ICD-10-CM

## 2014-08-02 LAB — COMPREHENSIVE METABOLIC PANEL
ALBUMIN: 2.8 g/dL — AB (ref 3.5–5.2)
ALT: 30 U/L (ref 0–53)
AST: 34 U/L (ref 0–37)
Alkaline Phosphatase: 43 U/L (ref 39–117)
BUN: 17 mg/dL (ref 6–23)
CO2: 23 mEq/L (ref 19–32)
Calcium: 9.5 mg/dL (ref 8.4–10.5)
Chloride: 106 mEq/L (ref 96–112)
Creatinine, Ser: 1.3 mg/dL (ref 0.4–1.5)
GFR: 59.62 mL/min — ABNORMAL LOW (ref >60.00)
Glucose, Bld: 250 mg/dL — ABNORMAL HIGH (ref 70–99)
Potassium: 4.4 mEq/L (ref 3.5–5.1)
Sodium: 137 mEq/L (ref 135–145)
Total Bilirubin: 1.2 mg/dL (ref 0.2–1.2)
Total Protein: 6.5 g/dL (ref 6.0–8.3)

## 2014-08-02 LAB — CBC WITH DIFFERENTIAL/PLATELET
BASOS ABS: 0 10*3/uL (ref 0.0–0.1)
Basophils Relative: 0.5 % (ref 0.0–3.0)
EOS ABS: 0 10*3/uL (ref 0.0–0.7)
Eosinophils Relative: 0.5 % (ref 0.0–5.0)
HCT: 32.5 % — ABNORMAL LOW (ref 39.0–52.0)
Hemoglobin: 10.9 g/dL — ABNORMAL LOW (ref 13.0–17.0)
Lymphocytes Relative: 22.1 % (ref 12.0–46.0)
Lymphs Abs: 1.1 10*3/uL (ref 0.7–4.0)
MCHC: 33.4 g/dL (ref 30.0–36.0)
MCV: 91.7 fl (ref 78.0–100.0)
MONO ABS: 0.3 10*3/uL (ref 0.1–1.0)
Monocytes Relative: 6.9 % (ref 3.0–12.0)
NEUTROS PCT: 70 % (ref 43.0–77.0)
Neutro Abs: 3.5 10*3/uL (ref 1.4–7.7)
PLATELETS: 213 10*3/uL (ref 150.0–400.0)
RBC: 3.54 Mil/uL — ABNORMAL LOW (ref 4.22–5.81)
RDW: 16 % — AB (ref 11.5–15.5)
WBC: 5.1 10*3/uL (ref 4.0–10.5)

## 2014-08-02 LAB — HEPATIC FUNCTION PANEL
ALBUMIN: 2.8 g/dL — AB (ref 3.5–5.2)
ALT: 30 U/L (ref 0–53)
AST: 34 U/L (ref 0–37)
Alkaline Phosphatase: 43 U/L (ref 39–117)
BILIRUBIN DIRECT: 0.3 mg/dL (ref 0.0–0.3)
TOTAL PROTEIN: 6.5 g/dL (ref 6.0–8.3)
Total Bilirubin: 1.2 mg/dL (ref 0.2–1.2)

## 2014-08-02 NOTE — Telephone Encounter (Signed)
Get information concerning his blood sugars before we make that call

## 2014-08-02 NOTE — Telephone Encounter (Signed)
Please call re: metformin dosing  He is currently taking just once a day, she wonders if he needs to go back to twice a day

## 2014-08-02 NOTE — Telephone Encounter (Signed)
Left message on home # that we need more info on his blood sugars before Dr.Lalonde can say to please call us on Monday to get this info

## 2014-08-02 NOTE — Patient Instructions (Signed)
Your physician has requested that you go to the basement for the following lab work before leaving today:  CBC, [place] will need to come to the lab in 3 months (end of Jan.) and have LFTS drawn every three months for 9 months.    You have a recall office visit with Dr. Fuller Plan in 06/2015.  You will receive a letter ahead of time to remind you to schedule the appointment

## 2014-08-02 NOTE — Progress Notes (Signed)
     08/02/2014 [name] [id number] 04/30/1936   History of Present Illness:  This is a 78 year old male who was recently admitted to the hospital for biliary obstruction.    Not a surgical candidate at this time due to Plavix requirement from cardiac stent placement in 05/2014.   --9/30 ERCP #1 with metal stent placement. Developed pancreatitis, cholangitis, stone occlusion of stent post-op   --10/2 ERCP # 2: Stent replaced, bile duct swept of stones and pus.  He is here today for hospital follow-up.  He feels ok.  No complaints of fevers, chills, nausea, vomiting, jaundice.  Says that appetite is good.  Has some mild abdominal discomfort, but this is below his umbilicus.    Last labs on 10/11 showed normal LFT's.  Last colonoscopy was 09/2012 by Dr. Fuller Plan at which time he was found to have internal hemorrhoids, moderate diverticulosis, and polyps that were tubular adenomas.  Repeat colonoscopy was recommended in 5 years from that time.   Current Medications, Allergies, Past Medical History, Past Surgical History, Family History and Social History were reviewed in [employer] record.   Physical Exam: BP 128/64  Pulse 76  Ht 5\' 9"  (1.753 m)  Wt 193 lb (87.544 kg)  BMI 28.49 kg/m2 General: Well developed white male in no acute distress Head: Normocephalic and atraumatic Eyes:  Sclerae anicteric, conjunctiva pink  Ears: Normal auditory acuity Lungs: Clear throughout to auscultation Heart: Regular rate and rhythm Abdomen: Soft, non-distended.  Normal bowel sounds.  Mild TTP just below the umbilicus. Musculoskeletal: Symmetrical with no gross deformities  Extremities: No edema  Neurological: Alert oriented x 4, grossly non-focal Psychological:  Alert and cooperative. Normal mood and affect  Assessment and Recommendations: *Biliary obstruction due to choledocholithiasis, cholelithiasis: Not a surgical option at this time due to Plavix requirement from  cardiac stent placement in 05/2014.   --9/30 ERCP #1 with metal stent placement. Developed pancreatitis, cholangitis, stone occlusion of stent post-op   --10/2 ERCP # 2: Stent replaced, bile duct swept of stones and pus. * Unstable angina. Ruled out for MI. DES placed 05/17/14 and committed to one year of Plavix. On  81 ASA as well.  -Will repeat LFT's today along with BMP and CBC.  Will recheck LFT's every 3 months until a recall office visit in 06/2014 at which time we could re-discuss cholecystectomy and stent removal.  In the interim, he is to contact us he develops abdominal pain, jaundice, fever, chills, nausea, vomiting, etc.

## 2014-08-04 NOTE — Progress Notes (Signed)
Reviewed and agree with management plan.  Jacqui Headen T. Trentan Trippe, MD FACG 

## 2014-08-06 ENCOUNTER — Telehealth: Payer: Self-pay | Admitting: Internal Medicine

## 2014-08-06 NOTE — Telephone Encounter (Signed)
Pt does use [facility] for his diabetic supplies. Pt test 3x a day.

## 2014-08-23 DIAGNOSIS — H40013 Open angle with borderline findings, low risk, bilateral: Secondary | ICD-10-CM | POA: Diagnosis not present

## 2014-08-27 ENCOUNTER — Telehealth: Payer: Self-pay | Admitting: Cardiology

## 2014-08-27 MED ORDER — EZETIMIBE 10 MG PO TABS: 10.0000 mg | ORAL_TABLET | Freq: Every day | ORAL | Status: DC

## 2014-08-27 NOTE — Telephone Encounter (Signed)
Pt need a prescription for his Zetia #90 instead of #30. Please call to [phone]

## 2014-08-27 NOTE — Telephone Encounter (Signed)
90 day supply sent to patient pharmacy

## 2014-09-10 ENCOUNTER — Other Ambulatory Visit: Payer: Self-pay | Admitting: Family Medicine

## 2014-09-12 ENCOUNTER — Encounter (HOSPITAL_COMMUNITY): Payer: Self-pay | Admitting: Interventional Cardiology

## 2014-10-04 DIAGNOSIS — J189 Pneumonia, unspecified organism: Secondary | ICD-10-CM

## 2014-10-04 HISTORY — DX: Pneumonia, unspecified organism: J18.9

## 2014-10-08 ENCOUNTER — Encounter: Payer: Self-pay | Admitting: Family Medicine

## 2014-10-08 ENCOUNTER — Ambulatory Visit (INDEPENDENT_AMBULATORY_CARE_PROVIDER_SITE_OTHER): Payer: Medicare Other | Admitting: Family Medicine

## 2014-10-08 ENCOUNTER — Telehealth: Payer: Self-pay | Admitting: Family Medicine

## 2014-10-08 VITALS — BP 140/80 | HR 74 | Ht 69.0 in | Wt 202.0 lb

## 2014-10-08 DIAGNOSIS — K831 Obstruction of bile duct: Secondary | ICD-10-CM | POA: Diagnosis not present

## 2014-10-08 DIAGNOSIS — T82857D Stenosis of cardiac prosthetic devices, implants and grafts, subsequent encounter: Secondary | ICD-10-CM

## 2014-10-08 DIAGNOSIS — E785 Hyperlipidemia, unspecified: Secondary | ICD-10-CM | POA: Diagnosis not present

## 2014-10-08 DIAGNOSIS — E118 Type 2 diabetes mellitus with unspecified complications: Secondary | ICD-10-CM

## 2014-10-08 DIAGNOSIS — N528 Other male erectile dysfunction: Secondary | ICD-10-CM

## 2014-10-08 DIAGNOSIS — I10 Essential (primary) hypertension: Secondary | ICD-10-CM

## 2014-10-08 DIAGNOSIS — E1159 Type 2 diabetes mellitus with other circulatory complications: Secondary | ICD-10-CM

## 2014-10-08 DIAGNOSIS — E1169 Type 2 diabetes mellitus with other specified complication: Secondary | ICD-10-CM | POA: Diagnosis not present

## 2014-10-08 DIAGNOSIS — N529 Male erectile dysfunction, unspecified: Secondary | ICD-10-CM

## 2014-10-08 DIAGNOSIS — E119 Type 2 diabetes mellitus without complications: Secondary | ICD-10-CM

## 2014-10-08 DIAGNOSIS — T82855D Stenosis of coronary artery stent, subsequent encounter: Secondary | ICD-10-CM

## 2014-10-08 LAB — POCT UA - MICROALBUMIN: Creatinine, POC: 78.5 mg/dL

## 2014-10-08 LAB — POCT GLYCOSYLATED HEMOGLOBIN (HGB A1C): Hemoglobin A1C: 5.7

## 2014-10-08 MED ORDER — SILDENAFIL CITRATE 20 MG PO TABS: ORAL_TABLET | ORAL | Status: DC

## 2014-10-08 NOTE — Telephone Encounter (Signed)
Pt states he never started the [place] because he was sick.  Per [employer] he does want pt to be on this medication.  Pt will start this medication and let us know when he needs RF.  Pt has [employer]

## 2014-10-08 NOTE — Patient Instructions (Signed)
Walk as much as you can but you might do better walking just for 5 minutes at a time. Keep right no bicycle

## 2014-10-08 NOTE — Progress Notes (Signed)
  Subjective:    [name] is a 79 y.o. male who presents for follow-up of Type 2 diabetes mellitus.  He seems be doing well and has lost weight however it was due to biliary obstruction. He will have surgery on that in several months. He will need to wait due to cardiac stenting less than 6 months ago. He would like a refill on Viagra.  Home blood sugar records: Patient test B/S one time a day  Current symptoms/problems NONE Daily foot checks: Any foot concerns:  Yes  feet hurt when he walks a lot and tinggel swells when he is on them to long Last eye exam:  5/15 ,9/15   Medication compliance: Good Current diet: NONE Current exercise: Fishing. His physical activity is limited to 2 back and leg pains. Known diabetic complications: see chronic problem list Cardiovascular risk factors: As per problem list   ROS as in subjective above    Objective:   General appearence: alert, no distress, WD/WN rt: RRR, normal S1, S2, no murmurs  Lab Review Lab Results  Component Value Date   HGBA1C 7.4* 07/02/2014   Lab Results  Component Value Date   CHOL 212* 05/15/2014   HDL 36* 05/15/2014   LDLCALC 124* 05/15/2014   TRIG 262* 05/15/2014   CHOLHDL 5.9 05/15/2014   No results found for: Derl Barrow   Chemistry      Component Value Date/Time   NA 137 08/02/2014 1101   K 4.4 08/02/2014 1101   CL 106 08/02/2014 1101   CO2 23 08/02/2014 1101   BUN 17 08/02/2014 1101   CREATININE 1.3 08/02/2014 1101   CREATININE 1.18 06/19/2014 1150      Component Value Date/Time   CALCIUM 9.5 08/02/2014 1101   ALKPHOS 43 08/02/2014 1101   ALKPHOS 43 08/02/2014 1101   AST 34 08/02/2014 1101   AST 34 08/02/2014 1101   ALT 30 08/02/2014 1101   ALT 30 08/02/2014 1101   BILITOT 1.2 08/02/2014 1101   BILITOT 1.2 08/02/2014 1101        Chemistry      Component Value Date/Time   NA 137 08/02/2014 1101   K 4.4 08/02/2014 1101   CL 106 08/02/2014 1101   CO2 23 08/02/2014 1101   BUN 17 08/02/2014 1101   CREATININE 1.3 08/02/2014 1101   CREATININE 1.18 06/19/2014 1150      Component Value Date/Time   CALCIUM 9.5 08/02/2014 1101   ALKPHOS 43 08/02/2014 1101   ALKPHOS 43 08/02/2014 1101   AST 34 08/02/2014 1101   AST 34 08/02/2014 1101   ALT 30 08/02/2014 1101   ALT 30 08/02/2014 1101   BILITOT 1.2 08/02/2014 1101   BILITOT 1.2 08/02/2014 1101       5.7 hemoglobin A1c    Assessment:  Hyperlipidemia LDL goal <70; statin intolerant  Diabetes mellitus without complication - Plan: POCT glycosylated hemoglobin (Hb A1C), POCT UA - Microalbumin  ED (erectile dysfunction) of organic origin  Biliary obstruction  Coronary stent restenosis with uncertain cause, subsequent encounter  Hypertension associated with diabetes  Type II diabetes mellitus with complication -- CAD, AAA        Plan:    1.  Rx changes: Sildenafil called in. 2.  Education: Reviewed 'ABCs' of diabetes management 3.  Compliance at present is estimated to be good

## 2014-10-15 ENCOUNTER — Telehealth: Payer: Self-pay | Admitting: Cardiology

## 2014-10-15 MED ORDER — CLOPIDOGREL BISULFATE 75 MG PO TABS: 75.0000 mg | ORAL_TABLET | Freq: Every day | ORAL | Status: DC

## 2014-10-15 NOTE — Telephone Encounter (Signed)
Pt need a new prescription for his Clopidogrel 75 mg #90 and refills please. Please call to [phone].

## 2014-10-15 NOTE — Telephone Encounter (Signed)
Rx(s) sent to pharmacy electronically.  

## 2014-10-29 IMAGING — CR DG CHEST 2V
2 series · 2 of 2 positions shown · non-contrast
Comparison: May 14, 2014

CLINICAL DATA: Chest pain, fever, and cough for 3 days

EXAM:
CHEST  2 VIEW

[w chest pa *]
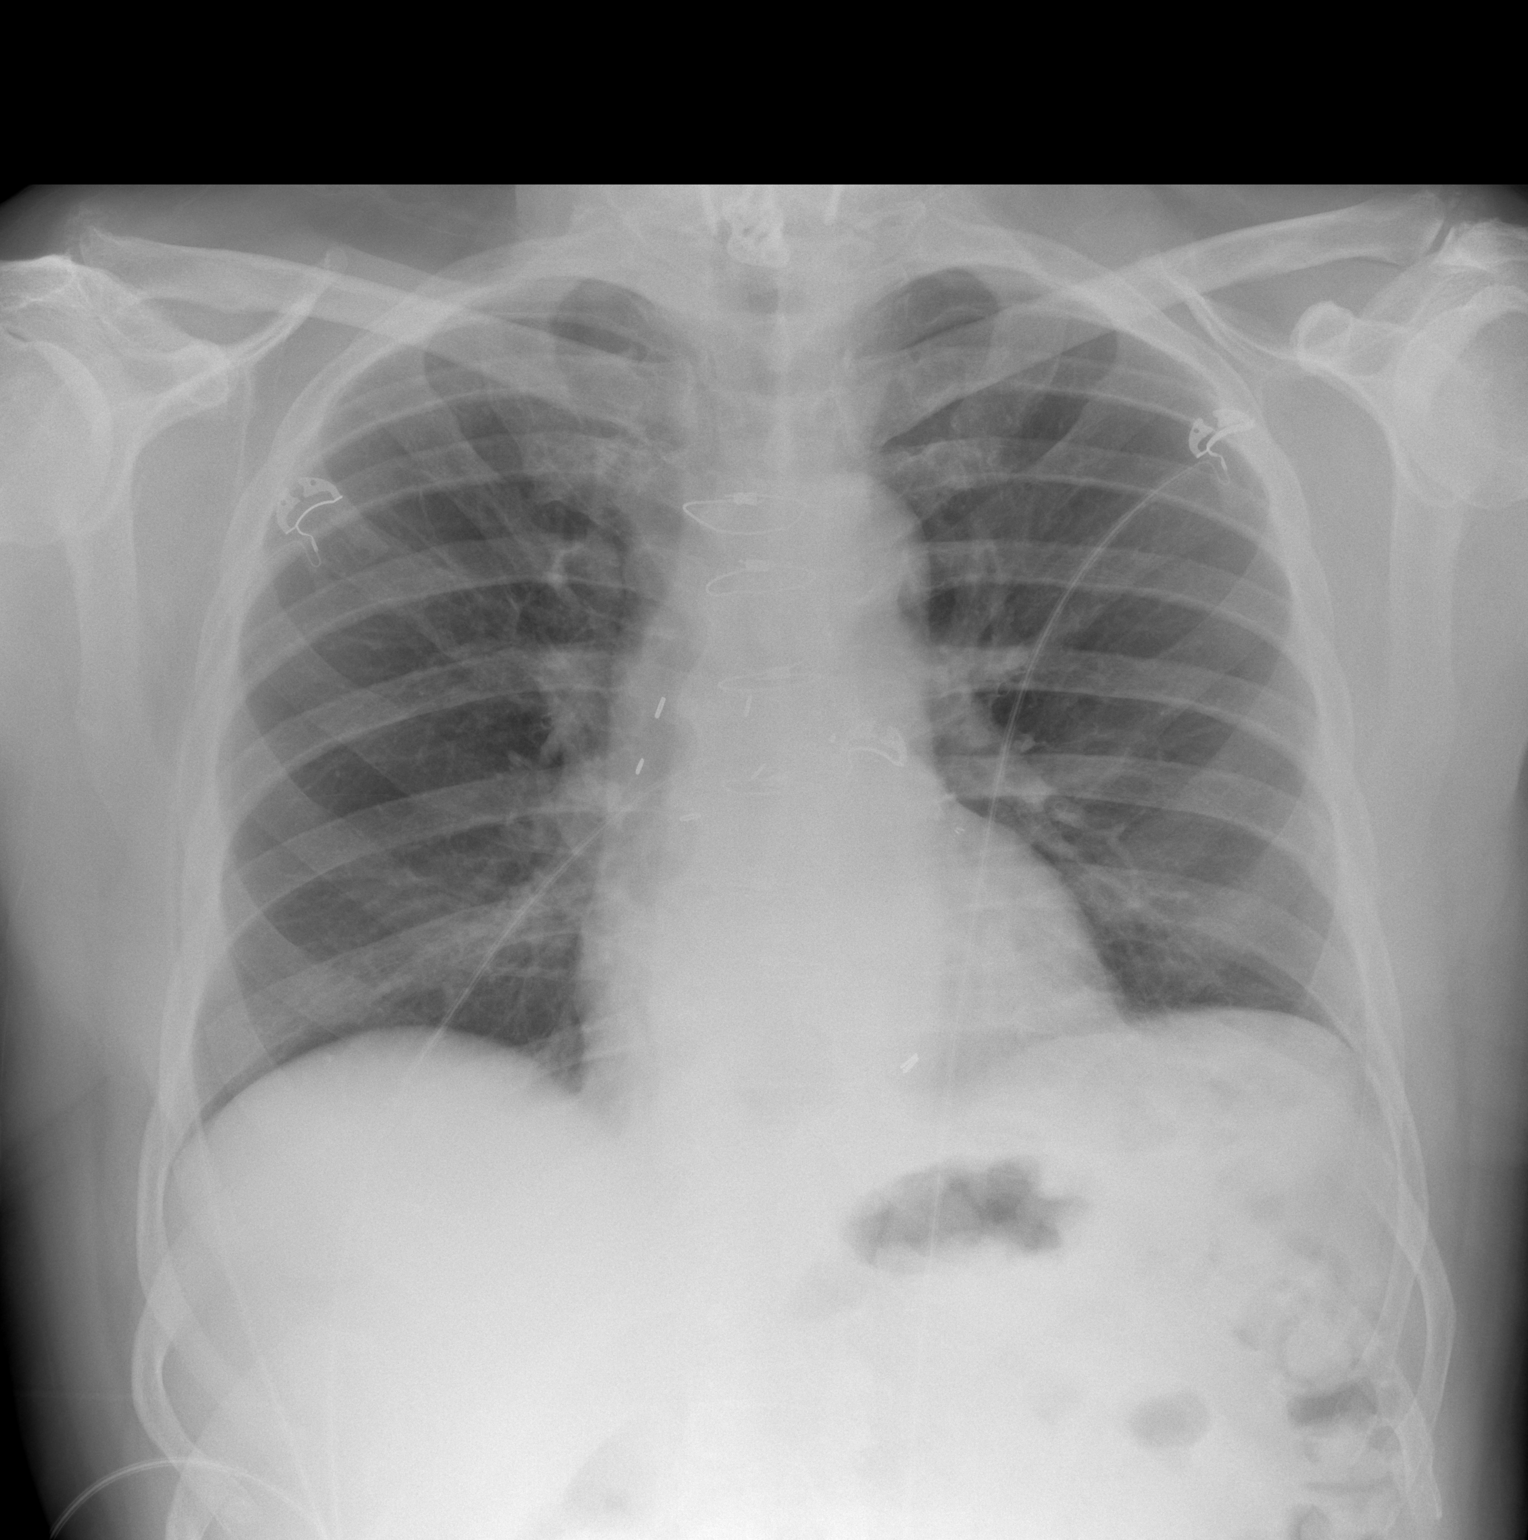

[w chest lat *]
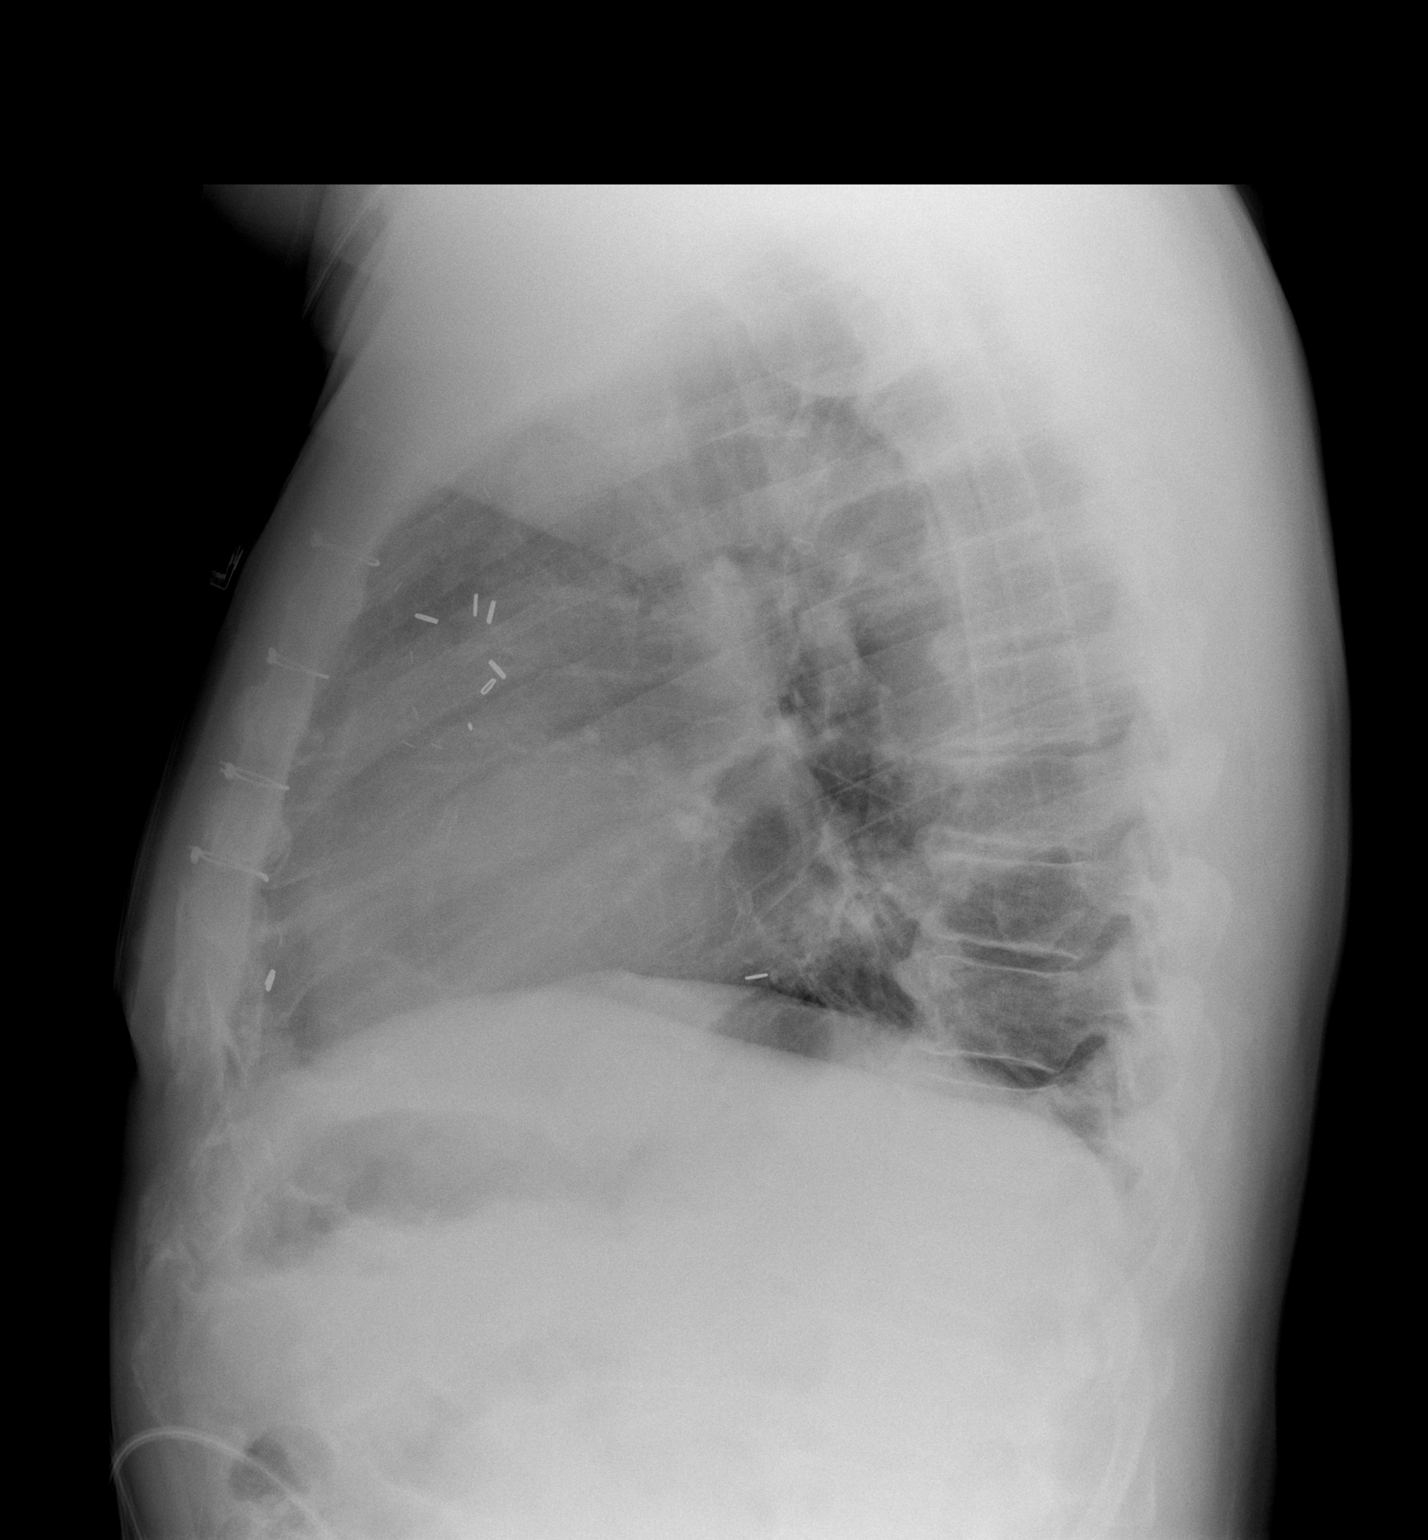

[2 of 2 positions shown; findings below may reference images not displayed]

FINDINGS: The heart size and mediastinal contours are stable. Patient status
post prior median sternotomy and CABG. Both lungs are clear. The
visualized skeletal structures are stable.
IMPRESSION: No active cardiopulmonary disease.

## 2014-11-01 ENCOUNTER — Telehealth: Payer: Self-pay | Admitting: Gastroenterology

## 2014-11-01 DIAGNOSIS — R7989 Other specified abnormal findings of blood chemistry: Secondary | ICD-10-CM

## 2014-11-01 DIAGNOSIS — R945 Abnormal results of liver function studies: Principal | ICD-10-CM

## 2014-11-01 NOTE — Telephone Encounter (Signed)
Spoke with pts wife and she is aware, order in epic.

## 2014-11-01 NOTE — Telephone Encounter (Signed)
Please have patient come in for hepatic function panel one day next week.  Thank you,  Jess

## 2014-11-05 ENCOUNTER — Other Ambulatory Visit: Payer: Self-pay | Admitting: *Deleted

## 2014-11-05 ENCOUNTER — Other Ambulatory Visit (INDEPENDENT_AMBULATORY_CARE_PROVIDER_SITE_OTHER): Payer: Medicare Other

## 2014-11-05 DIAGNOSIS — R7989 Other specified abnormal findings of blood chemistry: Secondary | ICD-10-CM

## 2014-11-05 DIAGNOSIS — K831 Obstruction of bile duct: Secondary | ICD-10-CM

## 2014-11-05 LAB — HEPATIC FUNCTION PANEL
ALBUMIN: 3.9 g/dL (ref 3.5–5.2)
ALT: 26 U/L (ref 0–53)
AST: 30 U/L (ref 0–37)
Alkaline Phosphatase: 29 U/L — ABNORMAL LOW (ref 39–117)
BILIRUBIN DIRECT: 0.3 mg/dL (ref 0.0–0.3)
BILIRUBIN TOTAL: 0.8 mg/dL (ref 0.2–1.2)
Total Protein: 6.3 g/dL (ref 6.0–8.3)

## 2014-11-11 ENCOUNTER — Other Ambulatory Visit: Payer: Self-pay | Admitting: Cardiology

## 2014-11-11 MED ORDER — EZETIMIBE 10 MG PO TABS: 10.0000 mg | ORAL_TABLET | Freq: Every day | ORAL | Status: DC

## 2014-11-11 NOTE — Telephone Encounter (Signed)
Rx(s) sent to pharmacy electronically.  

## 2014-11-11 NOTE — Telephone Encounter (Signed)
Pt need an new prescription for Zetia #90. Please call to [phone].

## 2014-11-26 DIAGNOSIS — H40013 Open angle with borderline findings, low risk, bilateral: Secondary | ICD-10-CM | POA: Diagnosis not present

## 2014-12-01 ENCOUNTER — Telehealth: Payer: Self-pay | Admitting: Family Medicine

## 2014-12-09 ENCOUNTER — Other Ambulatory Visit: Payer: Self-pay | Admitting: Family Medicine

## 2014-12-17 NOTE — Telephone Encounter (Signed)
Labs per [employer]-,  Wife will have pt come by one day next week for labs-lm

## 2014-12-17 NOTE — Telephone Encounter (Signed)
Spoke with pt's wife and she states pt is only taking the 2 cholesterol meds Fenofibrate & Zetia.  She said he hasn't had his cholesterol checked in a long time and doesn't want him to be on 3 cholesterol meds until they know if he needs to be on it or not.  So he doesn't want to start the [place] til after his appt in May and labs are drawn.  So if you want pt to be on this medicine in May will need to do an appeal for coverage.

## 2014-12-25 ENCOUNTER — Telehealth: Payer: Self-pay | Admitting: Physician Assistant

## 2014-12-25 MED ORDER — AMLODIPINE BESYLATE 5 MG PO TABS: 5.0000 mg | ORAL_TABLET | Freq: Every day | ORAL | Status: DC

## 2014-12-25 NOTE — Telephone Encounter (Signed)
Rx(s) sent to pharmacy electronically. Patient notified. 

## 2014-12-25 NOTE — Telephone Encounter (Signed)
°  1. Which medications need to be refilled? [id number] 2. Which pharmacy is medication to be sent to? [phone]. Do they need a 30 day or 90 day supply? 90 and refills  4. Would they like a call back once the medication has been sent to the pharmacy? yes

## 2015-01-06 ENCOUNTER — Encounter: Payer: Self-pay | Admitting: Cardiology

## 2015-01-06 ENCOUNTER — Ambulatory Visit (INDEPENDENT_AMBULATORY_CARE_PROVIDER_SITE_OTHER): Payer: Medicare Other | Admitting: Cardiology

## 2015-01-06 VITALS — BP 132/68 | HR 67 | Ht 69.0 in | Wt 207.2 lb

## 2015-01-06 DIAGNOSIS — E1169 Type 2 diabetes mellitus with other specified complication: Secondary | ICD-10-CM

## 2015-01-06 DIAGNOSIS — I10 Essential (primary) hypertension: Secondary | ICD-10-CM | POA: Diagnosis not present

## 2015-01-06 DIAGNOSIS — E1159 Type 2 diabetes mellitus with other circulatory complications: Secondary | ICD-10-CM

## 2015-01-06 DIAGNOSIS — Z79899 Other long term (current) drug therapy: Secondary | ICD-10-CM

## 2015-01-06 DIAGNOSIS — I5032 Chronic diastolic (congestive) heart failure: Secondary | ICD-10-CM

## 2015-01-06 DIAGNOSIS — Z01810 Encounter for preprocedural cardiovascular examination: Secondary | ICD-10-CM

## 2015-01-06 DIAGNOSIS — I2510 Atherosclerotic heart disease of native coronary artery without angina pectoris: Secondary | ICD-10-CM

## 2015-01-06 DIAGNOSIS — E785 Hyperlipidemia, unspecified: Secondary | ICD-10-CM | POA: Diagnosis not present

## 2015-01-06 DIAGNOSIS — Z8679 Personal history of other diseases of the circulatory system: Secondary | ICD-10-CM

## 2015-01-06 DIAGNOSIS — Z955 Presence of coronary angioplasty implant and graft: Secondary | ICD-10-CM

## 2015-01-06 DIAGNOSIS — Z9861 Coronary angioplasty status: Secondary | ICD-10-CM | POA: Diagnosis not present

## 2015-01-06 DIAGNOSIS — K8050 Calculus of bile duct without cholangitis or cholecystitis without obstruction: Secondary | ICD-10-CM

## 2015-01-06 DIAGNOSIS — E669 Obesity, unspecified: Secondary | ICD-10-CM

## 2015-01-06 NOTE — Patient Instructions (Signed)
Your physician recommends that you return for lab work in: FASTING at [facility].  Your physician recommends that you schedule a follow-up appointment in: 4 months.

## 2015-01-08 ENCOUNTER — Encounter: Payer: Self-pay | Admitting: Cardiology

## 2015-01-08 DIAGNOSIS — Z955 Presence of coronary angioplasty implant and graft: Secondary | ICD-10-CM | POA: Insufficient documentation

## 2015-01-08 DIAGNOSIS — Z01810 Encounter for preprocedural cardiovascular examination: Secondary | ICD-10-CM | POA: Insufficient documentation

## 2015-01-08 NOTE — Assessment & Plan Note (Signed)
No active heart failure symptoms. He is on adequate afterload reduction with current dose of ARB as well as amlodipine. Is not standing diuretic.

## 2015-01-08 NOTE — Assessment & Plan Note (Addendum)
Thankfully, no recurrent symptoms since fall of 2015 after staged PCI. He did have a relook catheterization done one month later with patent stents. He does not have listed sublingual nitroglycerin. But he does have it with him. Need to be cautious if used in conjunction with sildenafil

## 2015-01-08 NOTE — Assessment & Plan Note (Signed)
See above for preoperative evaluation.

## 2015-01-08 NOTE — Progress Notes (Signed)
PCP: [name], MD  Clinic Note: Chief Complaint  Patient presents with  . Follow-up    7 months:  No complaints of chest pain or dizziness.  Occas. SOB with activity.  Mild edema if he is on his feet all day.  . Coronary Artery Disease    hhistory of CABG and PCI  . Hyperlipidemia    statin intolerance    HPI: [name] is a 79 y.o. male with a PMH below who presents today for annual follow-up to see me, however in the interim since I last saw him in March 2015, he was admitted on May 18, 2014 with signs and symptoms of unstable angina. He was taken to cardiac catheterization lab where he was found to have in-stent restenosis in the RCA as well as disease in the circumflex. He then underwent staged PCI to in-stent restenosis in the RCA and OM1 PCI - both with DES stents. He then returned to the hospital in September with atypical chest pain symptoms and was taken back to the cardiac catheterization lab and catheterization at that time revealed patent stents. He has been maintained on Plavix with a therapeutic P2y12 Inhibition Assay.  He has been statin intolerant and was initially placed on Zetia plus fenofibrate. There had been discussion about initiating Repatha - mostly from his PCP, however this was never done. Usually to initiate this new therapy cardiology clearance is required.  Past Medical History  Diagnosis Date  . History of: ST elevation myocardial infarction (STEMI) involving left circumflex coronary artery with complication 9381    PTCA-circumflex; PCI in 1991  . CAD, multiple vessel 1985-2010    Most recent cath 01/01/09: 100% Occluded LAD after SP1, patent LIMA-distal LAD with apical 90% lesion.  Cx-OM1 widely patent stent into proximal bifurcating OM1 . Follow-on Cx => 70-80% -- non-amenable PCI. Widely patent 3 overlapping stents in midRCA with only 40% RPL stenosis; SVG- OM known to be occluded.  . S/P CABG x 2 1997    LIMA-LAD, SVG-OM  . H/O  unstable angina 06/2003, 12/2006, 05/2014    a) '04: Staged Taxus DES PCI to RCA and Cx-OM;;'08 - PCI to proximal ISR in RCA - Cypher DES; c) 05/2014: ISR in RCA & OM1, PCI with Promus P DES: RCA  2.75 x 12, OM1 3.0 mm x 8)  . History of nuclear stress test December 2013    LOW at Risk. Moderate region of mid to basal inferolateral scar without ischemia -- consistent with distal circumflex disease. Mild apical hypokinesis with an EF of 47%.  . Irregular heart beat     PVCs and PACs, no arrhythmia recorded  . Exertional dyspnea     Chronic baseline SOB with ambulation  . Sleep apnea     pt. states he was told to return for f/u, to be fitted for CPAp, but pt. reports that he didn't follow up  . AAA (abdominal aortic aneurysm) 08/30/12    Doppler 08/30/12 had a small amount of growth. It was a 4.2 x 4.3 cm greater in size than the previous one noted at 4 x 3.8.  . Diabetes mellitus     Not on oral medication  . Hypertension   . Dyslipidemia, goal LDL below 70   . Osteoarthritis of both knees     And back; multiple back surgeries, right knee arthroplasty and left knee arthroscopic surgery x2  . Chronic back pain      multiple surgeries; C-spine and lumbar  .  Erectile dysfunction   . H/O: pneumonia February 14  . Chronic anemia     On iron supplement; history of positive guaiac - negative colonoscopy in 1996.; Thought to be related to hemorrhoids; status post hemorrhoidectomy  . Ankle edema     Chronic  . Diverticulitis of colon 1996  . Choledocholithiasis   . Hemorrhoids   . Diverticulosis   . Adenomatous colon polyp   . Hiatal hernia   . Cholelithiasis - with cholangitis     status post ERCP with removal of calculi and biliary stent placement.    Prior Cardiac Evaluation and Past Surgical History: Past Surgical History  Procedure Laterality Date  . Coronary angioplasty  4098,1191    1985 lateral STEMI Circumflex PTCA  . Anterior lat lumbar fusion Left 11/22/2012    Procedure:  ANTERIOR LATERAL LUMBAR FUSION 1 LEVEL;  Surgeon: Eustace Moore, MD;  Location: [place] [facility];  Service: Neurosurgery;  Laterality: Left;  Anterior Lateral Lumbar Fusion Lumbar Three-Four  . Lumbar percutaneous pedicle screw 1 level N/A 11/22/2012    Procedure: LUMBAR PERCUTANEOUS PEDICLE SCREW 1 LEVEL;  Surgeon: Eustace Moore, MD;  Location: [place] [facility];  Service: Neurosurgery;  Laterality: N/A;  Lumbar Three-Four Percutaneous Pedicle Screw, Lateral approach  . Back surgery  1979 & x 10    pt. remarks, "I have had about 10 back surgeries"  . Posterior cervical fusion/foraminotomy N/A 05/02/2013    Procedure: POSTERIOR CERVICAL FUSION/FORAMINOTOMY CERVICAL SEVEN THORACIC-ONE;  Surgeon: Eustace Moore, MD;  Location: [place] [facility];  Service: Neurosurgery;  Laterality: N/A;  POSTERIOR CERVICAL FUSION/FORAMINOTOMY CERVICAL SEVEN THORACIC-ONE  . Coronary artery bypass graft  1990    LIMA-LAD, SVG-OM  . Cardiac catheterization  September 2004    None Occluded vein graft to OM; diffuse RCA disease in the mid vessel, 80% circumflex-OM stenosis; follow on AV groove circumflex with sequential 90% stenoses and intervening saccular dilation   . Percutaneous coronary stent intervention (pci-s)  September 2004    PCI - RCA 2 overlapping Taxus DES 2.75 mm x 32 mm and 2.75 mm x 12 mm (3.0 mm); PCI-Cx-OM1 - Taxus DES 3.0 mm x 20 mm (3.1 mm);   Marland Kitchen Percutaneous coronary stent intervention (pci-s)  March 2008    80% ISR in proximal Taxus stent in RCA -- covered proximally with Cypher DES 3.0 mm x 12 mm  . Cardiac catheterization  March 2010    4 abnormal Myoview showing apical thinning (possibly due to apical LAD 95%) : 100% Occluded LAD after SV1, distal LAD grafted via LIMA -apical 95% . Cx -OM1 w/patent stent extending into OM 1 . Follow on Cx - 70-80% - non-amenable PCI. RCA widely patent 3 overlapping stents in mRCA w/less than 40% stenosisin RPL; SVG-OM known occluded   . Cervical discectomy  04/23/10     Decompressive anterior carvical diskectomy. C4-5, C6-7  . Cervical arthrodesis  04/23/10    Anterior cervical arthrodesis, C4-5, C6-7 utilizing 7-mm PEEK interbody cage packed with local autograft & Antifuse putty at C4-5 & an 8-mm cage at C6-7.  Marland Kitchen Anterior cervical plating  04/23/10    At C4-5 and a C6-7 utilizing two separate Biomet MaxAn plates.  . Colonoscopy  1996  . Minor hemorrhoidectomy    . Total knee arthroplasty Right   . Knee arthroscopy Left     x 2  . Nm myoview ltd  December 2013    LOW RISK. Mmoderate region of mid to basal inferolateral scar without  ischemia. Mild apical hypokinesis with an EF of 47%.  . Transthoracic echocardiogram  December 2013    EF 55-60%. moderate LA dilation. Aortic Sclerosis  . Abdominal and lower extremity arterial ultrasound  08/23/2012; 10/10/2013    Normal ABIs. Nonocclusive lower extremity disease. 4.2 cm x 4.3 cm infrarenal AAA;; 4.4 cm x 4.3 cm (essentially stable)   . Cataract extraction    . Ercp N/A 07/03/2014    Procedure: ENDOSCOPIC RETROGRADE CHOLANGIOPANCREATOGRAPHY (ERCP);  Surgeon: Inda Castle, MD;  Location: [facility];  Service: Endoscopy;  Laterality: N/A;  . Biliary stent placement N/A 07/03/2014    Procedure: BILIARY STENT PLACEMENT;  Surgeon: Inda Castle, MD;  Location: [facility];  Service: Endoscopy;  Laterality: N/A;  . Ercp N/A 07/05/2014    Procedure: ENDOSCOPIC RETROGRADE CHOLANGIOPANCREATOGRAPHY (ERCP);  Surgeon: Inda Castle, MD;  Location: [facility];  Service: Endoscopy;  Laterality: N/A;  . Left heart catheterization with coronary angiogram N/A 05/15/2014    Procedure: LEFT HEART CATHETERIZATION WITH CORONARY ANGIOGRAM;  Surgeon: Sinclair Grooms, MD;  Location: [facility] CATH LAB;  Service: Cardiovascular;  Laterality: N/A;  . Percutaneous coronary stent intervention (pci-s) N/A 05/17/2014    Procedure: PERCUTANEOUS CORONARY STENT INTERVENTION (PCI-S);  Surgeon: Sinclair Grooms, MD;  [facility] C[facility] LAB;   Service: Cardiovascular;  Laterality: N/A;  . Left heart catheterization with coronary/graft angiogram N/A 06/28/2014    Procedure: LEFT HEART CATHETERIZATION WITH Beatrix Fetters;  Surgeon: Troy Sine, MD;  Location: [facility] CATH LAB;  Service: Cardiovascular;  Laterality: N/A;   Interval History: Since his last clinic visit, he is done relatively well without any significant symptoms. He continues to be very active without any significant complaints. He does do some but not routine exercise. He does note that if he overexerts himself, especially with rushing up a hill or up a flight of steps he will get short of breath. But otherwise he denies any resting or exertional anginal chest pain or dyspnea. No CHF symptoms of PND, orthopnea or edema. No palpitations, lightheadedness, dizziness, weakness, syncope/near syncope, or TIA/amaurosis fugax symptoms.  He does say that if he is on his feet for long period of time he may have some mild edema.  He has been troubled however with gallbladder concerns. He has gallstones and has had intermittent cholecystitis. He did undergo ERCP with stent placement and they are hoping to be able to perform full cholecystectomy, but this is been placed on hold due to his drug-eluting stents.  ROS: A comprehensive was performed. Review of Systems  Constitutional: Negative for malaise/fatigue.  HENT: Negative for nosebleeds.   Cardiovascular: Negative.        Per history of present illness  Gastrointestinal: Positive for nausea and abdominal pain (biliary colic). Negative for vomiting, blood in stool and melena.  Genitourinary: Negative for hematuria.  Musculoskeletal: Positive for joint pain (knees and hips).  Neurological: Negative for dizziness and focal weakness.  Endo/Heme/Allergies: Does not bruise/bleed easily.  Psychiatric/Behavioral: Negative.        He does seem a little bit upset about having to wait for his GI issues.  All other systems reviewed  and are negative.   Current Outpatient Prescriptions on File Prior to Visit  Medication Sig Dispense Refill  . amLODipine (NORVASC) 5 MG tablet Take 1 tablet (5 mg total) by mouth daily. 30 tablet 1  . aspirin 81 MG tablet Take 81 mg by mouth daily.    . Choline Fenofibrate (FENOFIBRIC ACID) 135  MG CPDR Take 1 tablet by mouth daily.    . clopidogrel (PLAVIX) 75 MG tablet Take 1 tablet (75 mg total) by mouth daily. <please make appointment with Dr. Ellyn Hack for refills> 90 tablet 0  . ezetimibe (ZETIA) 10 MG tablet Take 1 tablet (10 mg total) by mouth daily. 90 tablet 2  . ferrous sulfate 325 (65 FE) MG tablet Take 325 mg by mouth daily with breakfast.    . glucose blood test strip 1 each by Other route 2 (two) times daily. THIS IS FOR THE TRUE TRACK METERUse as instructed 100 each 12  . losartan (COZAAR) 50 MG tablet Take 50 mg by mouth daily.    . metoprolol succinate (TOPROL-XL) 25 MG 24 hr tablet Take 1 tablet (25 mg total) by mouth daily. 30 tablet 0  . Multiple Vitamins-Minerals (MULTIVITAMIN WITH MINERALS) tablet Take 1 tablet by mouth daily.     Marland Kitchen oxyCODONE (OXY IR/ROXICODONE) 5 MG immediate release tablet Take 1 tablet (5 mg total) by mouth every 6 (six) hours as needed for moderate pain. (Patient taking differently: Take 5 mg by mouth every 6 (six) hours as needed for moderate pain. PRN) 30 tablet 0  . pantoprazole (PROTONIX) 40 MG tablet Take 40 mg by mouth daily.    . pioglitazone-metformin (ACTOPLUS MET) 15-500 MG per tablet TAKE ONE TABLET BY MOUTH ONCE DAILY 90 tablet 0  . sildenafil (REVATIO) 20 MG tablet Take 2-5 pills daily as needed 50 tablet 5   No current facility-administered medications on file prior to visit.   Allergies  Allergen Reactions  . Lisinopril Cough    Rxn: unknown  . Statins Other (See Comments)    Muscle ache  . Welchol [Colesevelam Hcl] Itching    History  Substance Use Topics  . Smoking status: Former Smoker    Types: Cigarettes    Quit date:  10/05/1983  . Smokeless tobacco: Never Used  . Alcohol Use: 1.8 oz/week    3 Cans of beer per week     Comment: almost every day   Family History  Problem Relation Age of Onset  . Arthritis Mother   . Diabetes Father   . Heart disease Father   . Stroke Sister   . Hypertension Sister   . Heart disease Sister   . Diabetes Sister   . Breast cancer Sister   . Heart disease Brother   . Ulcers Brother   . Colon cancer Neg Hx     Wt Readings from Last 3 Encounters:  01/06/15 207 lb 3.2 oz (93.985 kg)  10/08/14 202 lb (91.627 kg)  08/02/14 193 lb (87.544 kg)   PHYSICAL EXAM BP 132/68 mmHg  Pulse 67  Ht '5\' 9"'  (1.753 m)  Wt 207 lb 3.2 oz (93.985 kg)  BMI 30.58 kg/m2 General appearance: A&Ox3, cooperative, appears stated age, NAD. Pleasant mood and affect, answers questions appropriately.  HEENT: [place]/AT, EOMI, MMM, anicteric sclera Neck: no adenopathy, no carotid bruit, no JVD and supple, symmetrical, trachea midline  Lungs: CTAB, normal percussion bilaterally and Good air movement, nonlabored, no W./ R./R.  Heart: normal apical impulse, RRR, S1, S2 normal and 1-2/6 SEM at value S/P without radiation. No R./G.  Abdomen: soft, mild RUQ tenderness. No HSM Extremities: No C/C/E  Pulses: 2+ and symmetric  Neurologic: Grossly normal    Adult ECG Report  Rate: 67 ;  Rhythm: normal sinus rhythm and 1 A-V block (PR interval 210 ms); otherwise normal axis (20), QTc 426. Mild nonspecific ST  and T-wave changes.  Narrative Interpretation: normal EKG  Recent Labs:  He is due for new check  Lab Results  Component Value Date   CHOL 212* 05/15/2014   HDL 36* 05/15/2014   LDLCALC 124* 05/15/2014   TRIG 262* 05/15/2014   CHOLHDL 5.9 05/15/2014   Lab Results  Component Value Date   K 4.4 08/02/2014   Lab Results  Component Value Date   CREATININE 1.3 08/02/2014    ASSESSMENT / PLAN: Problem List Items Addressed This Visit    CAD S/P CABG '95- several PCis since, last  05/17/14 - Primary (Chronic)    Status post PCI RCA and OM1 in the fall 2015. No longer having any symptoms of resting or exertional angina.  Plan: Continue Plavix, beta blocker, ARB, dihydropyridine calcium channel blocker; reset time for screening nuclear stress test for another 3 years or so pending symptoms.      Relevant Orders   EKG 12-Lead (Completed)   Lipid panel   Choledocholithiasis    See above for preoperative evaluation.      Chronic diastolic CHF (congestive heart failure) (Chronic)    No active heart failure symptoms. He is on adequate afterload reduction with current dose of ARB as well as amlodipine. Is not standing diuretic.      History of unstable angina    Thankfully, no recurrent symptoms since fall of 2015 after staged PCI. He did have a relook catheterization done one month later with patent stents. He does not have listed sublingual nitroglycerin. But he does have it with him. Need to be cautious if used in conjunction with sildenafil      Hyperlipidemia LDL goal <70; statin intolerant (Chronic)   Relevant Orders   Lipid panel   Hypertension associated with diabetes (Chronic)    Well-controlled on current medications. No change      Relevant Orders   EKG 12-Lead (Completed)   Medication management    Determining potential for adjusting anti-lipid medications and is on an ARB.      Relevant Orders   Lipid panel   Comprehensive metabolic panel   Obesity (BMI 30-39.9) (Chronic)    He unfortunately has gained weight back from October. We discussed importance of staying active exercising, and maintaining a steady healthy diet.      Preoperative cardiovascular examination    Essentially as noted above.  Would be okay as of May 2016 to temporarily suspend Plavix for 5 days preoperatively. Would hope to be able to use aspirin in the intervening duration while off Plavix.  Would be okay to proceed to the OR without further ischemic evaluation. Would be  considered a low risk cardiac patient (with treated/revascularized CAD with no active symptoms), and no evidence of heart failure, significant chronic kidney disease with creatinine greater than 2, no diabetes on insulin, no history of cerebrovascular disease. Intraperitoneal surgery would be considered a high risk surgery.  He would still be considered low risk with less than 4.1% chance of complications. Of course with suspension of Plavix within the first year, there is a chance of stent thrombosis, albeit unlikely based on European data..      Presence of drug coated stent in right coronary artery and circumflex coronary artery (Chronic)    He is now almost 8 months out from DES stents being placed. With essentially third-generation DES stents, there is increasingly convincing data from European studies that patients are fine with short-term suspension of dual antiplatelet therapy in the 6-9 month window  for necessary procedures.  By 9 months he should be fine to suspend Plavix for 5 days preoperativelyand use aspirin in the intervening duration until postop.  It would seem this symptomatically his cholecystectomy is probably indicated to be done sooner rather than later.          Orders Placed This Encounter  Procedures  . Lipid panel  . Comprehensive metabolic panel  . EKG 12-Lead   No orders of the defined types were placed in this encounter.    The patient has had several procedures and hospitalizations in the intervening time from his last visit. I personally reviewed 3 cardiac catheterization/PCI reports and updated the past medical history. I have also reviewed his ERCP data. In addition to discussing his routine follow-up we also discussed essential preoperative evaluation. I spent close to 45 minutes with the patient and an additional half an hour charting.  Followup: July-October    Leonie Man, M.D., M.S. Interventional Cardiologist   Pager #  [phone]

## 2015-01-08 NOTE — Assessment & Plan Note (Signed)
Status post PCI RCA and OM1 in the fall 2015. No longer having any symptoms of resting or exertional angina.  Plan: Continue Plavix, beta blocker, ARB, dihydropyridine calcium channel blocker; reset time for screening nuclear stress test for another 3 years or so pending symptoms.

## 2015-01-08 NOTE — Assessment & Plan Note (Signed)
Essentially as noted above.  Would be okay as of May 2016 to temporarily suspend Plavix for 5 days preoperatively. Would hope to be able to use aspirin in the intervening duration while off Plavix.  Would be okay to proceed to the OR without further ischemic evaluation. Would be considered a low risk cardiac patient (with treated/revascularized CAD with no active symptoms), and no evidence of heart failure, significant chronic kidney disease with creatinine greater than 2, no diabetes on insulin, no history of cerebrovascular disease. Intraperitoneal surgery would be considered a high risk surgery.  He would still be considered low risk with less than 0.2% chance of complications. Of course with suspension of Plavix within the first year, there is a chance of stent thrombosis, albeit unlikely based on European data.Marland Kitchen

## 2015-01-08 NOTE — Assessment & Plan Note (Signed)
He unfortunately has gained weight back from October. We discussed importance of staying active exercising, and maintaining a steady healthy diet.

## 2015-01-08 NOTE — Assessment & Plan Note (Signed)
Determining potential for adjusting anti-lipid medications and is on an ARB.

## 2015-01-08 NOTE — Assessment & Plan Note (Signed)
Plan was for him to start [place] - prescribed by PCP, however the patient is reluctant to take additional medications especially in light of his biliary issues.  Plan:he is due for lipid panel and CMP follow-up. Would consider referral to our clinical pharmacologist for consideration of initiation of Repatha or other PCS K9 inhibitior.

## 2015-01-08 NOTE — Assessment & Plan Note (Addendum)
He is now almost 8 months out from DES stents being placed. With essentially third-generation DES stents, there is increasingly convincing data from European studies that patients are fine with short-term suspension of dual antiplatelet therapy in the 6-9 month window for necessary procedures.  By 9 months he should be fine to suspend Plavix for 5 days preoperativelyand use aspirin in the intervening duration until postop.  It would seem this symptomatically his cholecystectomy is probably indicated to be done sooner rather than later.

## 2015-01-08 NOTE — Assessment & Plan Note (Signed)
Well controlled on current medications. No change 

## 2015-01-09 ENCOUNTER — Telehealth: Payer: Self-pay | Admitting: Cardiology

## 2015-01-09 ENCOUNTER — Telehealth: Payer: Self-pay | Admitting: Internal Medicine

## 2015-01-09 DIAGNOSIS — I2510 Atherosclerotic heart disease of native coronary artery without angina pectoris: Secondary | ICD-10-CM | POA: Diagnosis not present

## 2015-01-09 DIAGNOSIS — Z9861 Coronary angioplasty status: Secondary | ICD-10-CM | POA: Diagnosis not present

## 2015-01-09 DIAGNOSIS — E785 Hyperlipidemia, unspecified: Secondary | ICD-10-CM | POA: Diagnosis not present

## 2015-01-09 DIAGNOSIS — Z79899 Other long term (current) drug therapy: Secondary | ICD-10-CM | POA: Diagnosis not present

## 2015-01-09 NOTE — Telephone Encounter (Signed)
Pt is giving [place] verbal permission to talk to the diabetic supply company about his appointment coming up

## 2015-01-09 NOTE — Telephone Encounter (Signed)
She was suppose to call back and let you know who the doctor was. The doctor is Dr Fuller Plan with [facility].

## 2015-01-10 ENCOUNTER — Other Ambulatory Visit: Payer: Self-pay | Admitting: Cardiology

## 2015-01-10 LAB — COMPREHENSIVE METABOLIC PANEL
ALT: 24 U/L (ref 0–53)
AST: 30 U/L (ref 0–37)
Albumin: 3.9 g/dL (ref 3.5–5.2)
Alkaline Phosphatase: 40 U/L (ref 39–117)
BUN: 28 mg/dL — AB (ref 6–23)
CALCIUM: 9 mg/dL (ref 8.4–10.5)
CHLORIDE: 105 meq/L (ref 96–112)
CO2: 25 meq/L (ref 19–32)
CREATININE: 1.31 mg/dL (ref 0.50–1.35)
GLUCOSE: 87 mg/dL (ref 70–99)
POTASSIUM: 4.2 meq/L (ref 3.5–5.3)
Sodium: 138 mEq/L (ref 135–145)
Total Bilirubin: 0.8 mg/dL (ref 0.2–1.2)
Total Protein: 6 g/dL (ref 6.0–8.3)

## 2015-01-10 LAB — LIPID PANEL
CHOL/HDL RATIO: 3.5 ratio
CHOLESTEROL: 156 mg/dL (ref 0–200)
HDL: 44 mg/dL (ref >=40)
LDL CALC: 99 mg/dL (ref 0–99)
Triglycerides: 64 mg/dL (ref <150)
VLDL: 13 mg/dL (ref 0–40)

## 2015-01-10 NOTE — Telephone Encounter (Signed)
Rx(s) sent to pharmacy electronically.  

## 2015-01-13 NOTE — Telephone Encounter (Signed)
FORWARD TO Dr Ellyn Hack

## 2015-01-14 NOTE — Telephone Encounter (Signed)
I forwarded note to Dr. Fuller Plan.  [place]

## 2015-01-15 NOTE — Telephone Encounter (Signed)
Labs are in, please review and advise about the Repatha

## 2015-01-15 NOTE — Telephone Encounter (Signed)
At this time,I don't think that I would push the issue

## 2015-01-16 NOTE — Telephone Encounter (Signed)
Has pt been informed about labs or do I need to call?

## 2015-01-20 ENCOUNTER — Other Ambulatory Visit: Payer: Self-pay | Admitting: Cardiology

## 2015-01-30 ENCOUNTER — Telehealth: Payer: Self-pay | Admitting: *Deleted

## 2015-01-30 NOTE — Telephone Encounter (Signed)
Patient's wife will have patient come for lab.

## 2015-01-30 NOTE — Telephone Encounter (Signed)
-----   Message from [name], RN sent at 01/02/2015  8:37 AM EDT ----- Call and remind patient due for labs for [name]

## 2015-02-04 ENCOUNTER — Other Ambulatory Visit (INDEPENDENT_AMBULATORY_CARE_PROVIDER_SITE_OTHER): Payer: Medicare Other

## 2015-02-04 ENCOUNTER — Other Ambulatory Visit: Payer: Self-pay | Admitting: Cardiology

## 2015-02-04 DIAGNOSIS — K831 Obstruction of bile duct: Secondary | ICD-10-CM | POA: Diagnosis not present

## 2015-02-04 LAB — HEPATIC FUNCTION PANEL
ALT: 27 U/L (ref 0–53)
AST: 29 U/L (ref 0–37)
Albumin: 3.7 g/dL (ref 3.5–5.2)
Alkaline Phosphatase: 34 U/L — ABNORMAL LOW (ref 39–117)
BILIRUBIN TOTAL: 0.7 mg/dL (ref 0.2–1.2)
Bilirubin, Direct: 0.2 mg/dL (ref 0.0–0.3)
Total Protein: 6.7 g/dL (ref 6.0–8.3)

## 2015-02-06 ENCOUNTER — Encounter: Payer: Self-pay | Admitting: Family Medicine

## 2015-02-06 ENCOUNTER — Ambulatory Visit (INDEPENDENT_AMBULATORY_CARE_PROVIDER_SITE_OTHER): Payer: Medicare Other | Admitting: Family Medicine

## 2015-02-06 VITALS — BP 130/78 | HR 80 | Wt 212.0 lb

## 2015-02-06 DIAGNOSIS — I10 Essential (primary) hypertension: Secondary | ICD-10-CM

## 2015-02-06 DIAGNOSIS — E118 Type 2 diabetes mellitus with unspecified complications: Secondary | ICD-10-CM | POA: Diagnosis not present

## 2015-02-06 DIAGNOSIS — I2510 Atherosclerotic heart disease of native coronary artery without angina pectoris: Secondary | ICD-10-CM

## 2015-02-06 DIAGNOSIS — E1159 Type 2 diabetes mellitus with other circulatory complications: Secondary | ICD-10-CM

## 2015-02-06 DIAGNOSIS — K831 Obstruction of bile duct: Secondary | ICD-10-CM | POA: Diagnosis not present

## 2015-02-06 DIAGNOSIS — Z9861 Coronary angioplasty status: Secondary | ICD-10-CM

## 2015-02-06 DIAGNOSIS — E1169 Type 2 diabetes mellitus with other specified complication: Secondary | ICD-10-CM | POA: Diagnosis not present

## 2015-02-06 DIAGNOSIS — E669 Obesity, unspecified: Secondary | ICD-10-CM

## 2015-02-06 DIAGNOSIS — N528 Other male erectile dysfunction: Secondary | ICD-10-CM | POA: Diagnosis not present

## 2015-02-06 DIAGNOSIS — Z79899 Other long term (current) drug therapy: Secondary | ICD-10-CM | POA: Diagnosis not present

## 2015-02-06 DIAGNOSIS — N529 Male erectile dysfunction, unspecified: Secondary | ICD-10-CM

## 2015-02-06 LAB — POCT GLYCOSYLATED HEMOGLOBIN (HGB A1C): Hemoglobin A1C: 6.2

## 2015-02-06 NOTE — Progress Notes (Signed)
   Subjective:    Patient ID: [name], male    DOB: 1936-01-12, 79 y.o.   MRN: [id number]  HPI He is here for an interval evaluation. He saw his cardiologist in April concerning his underlying heart disease.He does not complain of chest pain, shortness of breath. He also has had difficulty recently with gallbladder disease and does have a biliary stent. Apparently that is not causing him any present difficulty. He continues on medications listed in the chart. He checks his blood sugars regularly and they are in the low 100s. He does not smoke. Drinking is intermittent and usually 2 beers or less per day. He does exercise mainly with gardening. Does check his feet and does complain of intermittent tingling. He has had a nice exam in the last year. He did ask for a sample of Viagra. He states that the generic does not work as well as the branded product.  Review of Systems     Objective:   Physical Exam Alert and in no distress. Hemoglobin A1c is 6.2.       Assessment & Plan:  Type II diabetes mellitus with complication -- CAD, AAA - Plan: HgB A1c  Obesity (BMI 30-39.9)  Medication management  Hypertension associated with diabetes  ED (erectile dysfunction) of organic origin  Biliary obstruction A sample of Viagra was given. Also discussed in detail the need for him to have an advanced directive. He understands this and does plan to act on it. Continue on present medication regimen.

## 2015-02-20 ENCOUNTER — Other Ambulatory Visit: Payer: Self-pay | Admitting: Cardiology

## 2015-02-20 NOTE — Telephone Encounter (Signed)
90 day script sent electronically to the pharmacy

## 2015-02-20 NOTE — Telephone Encounter (Signed)
Mrs. Hazelrigg is calling because [name] is asking if he can get his medication (amlodipine besylate 5mg  ) for 90 days instead of 30 days.

## 2015-04-01 ENCOUNTER — Other Ambulatory Visit: Payer: Self-pay | Admitting: Cardiology

## 2015-04-01 DIAGNOSIS — I714 Abdominal aortic aneurysm, without rupture, unspecified: Secondary | ICD-10-CM

## 2015-04-18 ENCOUNTER — Ambulatory Visit (HOSPITAL_COMMUNITY)
Admission: RE | Admit: 2015-04-18 | Discharge: 2015-04-18 | Disposition: A | Discharge status: Home / Self Care | Payer: Medicare Other | Source: Ambulatory Visit | Attending: Cardiology | Admitting: Cardiology

## 2015-04-18 DIAGNOSIS — I714 Abdominal aortic aneurysm, without rupture, unspecified: Secondary | ICD-10-CM

## 2015-04-18 NOTE — Progress Notes (Signed)
PRELIMINARY RESULTS BY TECH - AAA Duplex Completed.     Stable distal AAA with current measurements of 4.4 x 4.5 cm. Oda Cogan, BS, RDMS, RVT

## 2015-04-22 DIAGNOSIS — M25532 Pain in left wrist: Secondary | ICD-10-CM | POA: Diagnosis not present

## 2015-05-02 ENCOUNTER — Other Ambulatory Visit: Payer: Self-pay | Admitting: Family Medicine

## 2015-05-08 ENCOUNTER — Encounter: Payer: Self-pay | Admitting: Gastroenterology

## 2015-05-21 ENCOUNTER — Inpatient Hospital Stay (HOSPITAL_COMMUNITY)
Admission: AD | Admit: 2015-05-21 | Discharge: 2015-05-26 | DRG: 418 | Disposition: A | Discharge status: Home / Self Care | Payer: Medicare Other | Source: Ambulatory Visit | Attending: Internal Medicine | Admitting: Internal Medicine

## 2015-05-21 ENCOUNTER — Telehealth: Payer: Self-pay | Admitting: Gastroenterology

## 2015-05-21 ENCOUNTER — Encounter: Payer: Self-pay | Admitting: Gastroenterology

## 2015-05-21 ENCOUNTER — Inpatient Hospital Stay (HOSPITAL_COMMUNITY): Payer: Medicare Other

## 2015-05-21 ENCOUNTER — Other Ambulatory Visit (INDEPENDENT_AMBULATORY_CARE_PROVIDER_SITE_OTHER): Payer: Medicare Other

## 2015-05-21 ENCOUNTER — Ambulatory Visit (INDEPENDENT_AMBULATORY_CARE_PROVIDER_SITE_OTHER): Payer: Medicare Other | Admitting: Gastroenterology

## 2015-05-21 ENCOUNTER — Encounter (HOSPITAL_COMMUNITY): Payer: Self-pay | Admitting: *Deleted

## 2015-05-21 VITALS — BP 134/70 | HR 68 | Ht 69.0 in | Wt 209.6 lb

## 2015-05-21 DIAGNOSIS — K8067 Calculus of gallbladder and bile duct with acute and chronic cholecystitis with obstruction: Principal | ICD-10-CM | POA: Diagnosis present

## 2015-05-21 DIAGNOSIS — Z951 Presence of aortocoronary bypass graft: Secondary | ICD-10-CM

## 2015-05-21 DIAGNOSIS — E785 Hyperlipidemia, unspecified: Secondary | ICD-10-CM | POA: Diagnosis not present

## 2015-05-21 DIAGNOSIS — I714 Abdominal aortic aneurysm, without rupture, unspecified: Secondary | ICD-10-CM

## 2015-05-21 DIAGNOSIS — Z79891 Long term (current) use of opiate analgesic: Secondary | ICD-10-CM

## 2015-05-21 DIAGNOSIS — N529 Male erectile dysfunction, unspecified: Secondary | ICD-10-CM | POA: Diagnosis present

## 2015-05-21 DIAGNOSIS — Z01810 Encounter for preprocedural cardiovascular examination: Secondary | ICD-10-CM | POA: Diagnosis not present

## 2015-05-21 DIAGNOSIS — I493 Ventricular premature depolarization: Secondary | ICD-10-CM | POA: Diagnosis present

## 2015-05-21 DIAGNOSIS — K8010 Calculus of gallbladder with chronic cholecystitis without obstruction: Secondary | ICD-10-CM

## 2015-05-21 DIAGNOSIS — K8000 Calculus of gallbladder with acute cholecystitis without obstruction: Secondary | ICD-10-CM | POA: Diagnosis not present

## 2015-05-21 DIAGNOSIS — I10 Essential (primary) hypertension: Secondary | ICD-10-CM | POA: Diagnosis present

## 2015-05-21 DIAGNOSIS — Z87891 Personal history of nicotine dependence: Secondary | ICD-10-CM

## 2015-05-21 DIAGNOSIS — K8033 Calculus of bile duct with acute cholangitis with obstruction: Secondary | ICD-10-CM

## 2015-05-21 DIAGNOSIS — K831 Obstruction of bile duct: Secondary | ICD-10-CM

## 2015-05-21 DIAGNOSIS — I2510 Atherosclerotic heart disease of native coronary artery without angina pectoris: Secondary | ICD-10-CM | POA: Diagnosis not present

## 2015-05-21 DIAGNOSIS — R1011 Right upper quadrant pain: Secondary | ICD-10-CM

## 2015-05-21 DIAGNOSIS — Z96651 Presence of right artificial knee joint: Secondary | ICD-10-CM | POA: Diagnosis present

## 2015-05-21 DIAGNOSIS — I252 Old myocardial infarction: Secondary | ICD-10-CM | POA: Diagnosis not present

## 2015-05-21 DIAGNOSIS — E119 Type 2 diabetes mellitus without complications: Secondary | ICD-10-CM | POA: Diagnosis present

## 2015-05-21 DIAGNOSIS — Z8701 Personal history of pneumonia (recurrent): Secondary | ICD-10-CM | POA: Diagnosis not present

## 2015-05-21 DIAGNOSIS — Z8249 Family history of ischemic heart disease and other diseases of the circulatory system: Secondary | ICD-10-CM

## 2015-05-21 DIAGNOSIS — Z823 Family history of stroke: Secondary | ICD-10-CM

## 2015-05-21 DIAGNOSIS — E1169 Type 2 diabetes mellitus with other specified complication: Secondary | ICD-10-CM | POA: Diagnosis present

## 2015-05-21 DIAGNOSIS — I5032 Chronic diastolic (congestive) heart failure: Secondary | ICD-10-CM | POA: Diagnosis present

## 2015-05-21 DIAGNOSIS — I110 Hypertensive heart disease with heart failure: Secondary | ICD-10-CM | POA: Diagnosis present

## 2015-05-21 DIAGNOSIS — Z7902 Long term (current) use of antithrombotics/antiplatelets: Secondary | ICD-10-CM

## 2015-05-21 DIAGNOSIS — G8929 Other chronic pain: Secondary | ICD-10-CM | POA: Diagnosis present

## 2015-05-21 DIAGNOSIS — I52 Other heart disorders in diseases classified elsewhere: Secondary | ICD-10-CM | POA: Diagnosis present

## 2015-05-21 DIAGNOSIS — Z9861 Coronary angioplasty status: Secondary | ICD-10-CM | POA: Diagnosis not present

## 2015-05-21 DIAGNOSIS — Z7982 Long term (current) use of aspirin: Secondary | ICD-10-CM | POA: Diagnosis not present

## 2015-05-21 DIAGNOSIS — Z888 Allergy status to other drugs, medicaments and biological substances status: Secondary | ICD-10-CM

## 2015-05-21 DIAGNOSIS — Z79899 Other long term (current) drug therapy: Secondary | ICD-10-CM

## 2015-05-21 DIAGNOSIS — Z833 Family history of diabetes mellitus: Secondary | ICD-10-CM

## 2015-05-21 DIAGNOSIS — G4733 Obstructive sleep apnea (adult) (pediatric): Secondary | ICD-10-CM | POA: Diagnosis present

## 2015-05-21 DIAGNOSIS — E118 Type 2 diabetes mellitus with unspecified complications: Secondary | ICD-10-CM

## 2015-05-21 DIAGNOSIS — Z419 Encounter for procedure for purposes other than remedying health state, unspecified: Secondary | ICD-10-CM

## 2015-05-21 DIAGNOSIS — K828 Other specified diseases of gallbladder: Secondary | ICD-10-CM | POA: Diagnosis present

## 2015-05-21 DIAGNOSIS — R109 Unspecified abdominal pain: Secondary | ICD-10-CM | POA: Diagnosis not present

## 2015-05-21 DIAGNOSIS — D649 Anemia, unspecified: Secondary | ICD-10-CM | POA: Diagnosis present

## 2015-05-21 DIAGNOSIS — Z955 Presence of coronary angioplasty implant and graft: Secondary | ICD-10-CM | POA: Diagnosis not present

## 2015-05-21 DIAGNOSIS — M549 Dorsalgia, unspecified: Secondary | ICD-10-CM | POA: Diagnosis not present

## 2015-05-21 DIAGNOSIS — K449 Diaphragmatic hernia without obstruction or gangrene: Secondary | ICD-10-CM | POA: Diagnosis not present

## 2015-05-21 DIAGNOSIS — M170 Bilateral primary osteoarthritis of knee: Secondary | ICD-10-CM | POA: Diagnosis present

## 2015-05-21 DIAGNOSIS — I2582 Chronic total occlusion of coronary artery: Secondary | ICD-10-CM | POA: Diagnosis present

## 2015-05-21 DIAGNOSIS — K819 Cholecystitis, unspecified: Secondary | ICD-10-CM | POA: Diagnosis not present

## 2015-05-21 LAB — COMPREHENSIVE METABOLIC PANEL
ALT: 42 U/L (ref 17–63)
AST: 57 U/L — AB (ref 15–41)
Albumin: 3.7 g/dL (ref 3.5–5.0)
Alkaline Phosphatase: 32 U/L — ABNORMAL LOW (ref 38–126)
Anion gap: 6 (ref 5–15)
BUN: 20 mg/dL (ref 6–20)
CHLORIDE: 106 mmol/L (ref 101–111)
CO2: 24 mmol/L (ref 22–32)
CREATININE: 1.3 mg/dL — AB (ref 0.61–1.24)
Calcium: 8.9 mg/dL (ref 8.9–10.3)
GFR calc Af Amer: 59 mL/min — ABNORMAL LOW (ref >60)
GFR, EST NON AFRICAN AMERICAN: 51 mL/min — AB (ref >60)
Glucose, Bld: 204 mg/dL — ABNORMAL HIGH (ref 65–99)
POTASSIUM: 4.1 mmol/L (ref 3.5–5.1)
SODIUM: 136 mmol/L (ref 135–145)
Total Bilirubin: 0.4 mg/dL (ref 0.3–1.2)
Total Protein: 7 g/dL (ref 6.5–8.1)

## 2015-05-21 LAB — CBC WITH DIFFERENTIAL/PLATELET
BASOS ABS: 0 10*3/uL (ref 0.0–0.1)
Basophils Relative: 0.3 % (ref 0.0–3.0)
EOS PCT: 0.1 % (ref 0.0–5.0)
Eosinophils Absolute: 0 10*3/uL (ref 0.0–0.7)
HCT: 34 % — ABNORMAL LOW (ref 39.0–52.0)
HEMOGLOBIN: 11.3 g/dL — AB (ref 13.0–17.0)
LYMPHS ABS: 1.4 10*3/uL (ref 0.7–4.0)
Lymphocytes Relative: 15.8 % (ref 12.0–46.0)
MCHC: 33.3 g/dL (ref 30.0–36.0)
MCV: 94.8 fl (ref 78.0–100.0)
MONO ABS: 0.4 10*3/uL (ref 0.1–1.0)
Monocytes Relative: 4.3 % (ref 3.0–12.0)
NEUTROS PCT: 79.5 % — AB (ref 43.0–77.0)
Neutro Abs: 7.2 10*3/uL (ref 1.4–7.7)
Platelets: 185 10*3/uL (ref 150.0–400.0)
RBC: 3.59 Mil/uL — AB (ref 4.22–5.81)
RDW: 15.5 % (ref 11.5–15.5)
WBC: 9 10*3/uL (ref 4.0–10.5)

## 2015-05-21 LAB — CREATININE, SERUM
CREATININE: 1.3 mg/dL — AB (ref 0.61–1.24)
GFR, EST AFRICAN AMERICAN: 59 mL/min — AB (ref >60)
GFR, EST NON AFRICAN AMERICAN: 51 mL/min — AB (ref >60)

## 2015-05-21 LAB — LIPASE: LIPASE: 31 U/L (ref 11.0–59.0)

## 2015-05-21 LAB — CBC
HCT: 33.6 % — ABNORMAL LOW (ref 39.0–52.0)
Hemoglobin: 10.8 g/dL — ABNORMAL LOW (ref 13.0–17.0)
MCH: 30.7 pg (ref 26.0–34.0)
MCHC: 32.1 g/dL (ref 30.0–36.0)
MCV: 95.5 fL (ref 78.0–100.0)
PLATELETS: 171 10*3/uL (ref 150–400)
RBC: 3.52 MIL/uL — AB (ref 4.22–5.81)
RDW: 14.4 % (ref 11.5–15.5)
WBC: 13.7 10*3/uL — AB (ref 4.0–10.5)

## 2015-05-21 LAB — HEPATIC FUNCTION PANEL
ALK PHOS: 32 U/L — AB (ref 39–117)
ALT: 32 U/L (ref 0–53)
AST: 60 U/L — AB (ref 0–37)
Albumin: 3.9 g/dL (ref 3.5–5.2)
BILIRUBIN DIRECT: 0.4 mg/dL — AB (ref 0.0–0.3)
BILIRUBIN TOTAL: 0.8 mg/dL (ref 0.2–1.2)
Total Protein: 6.5 g/dL (ref 6.0–8.3)

## 2015-05-21 LAB — GLUCOSE, CAPILLARY: GLUCOSE-CAPILLARY: 157 mg/dL — AB (ref 65–99)

## 2015-05-21 MED ORDER — SODIUM CHLORIDE 0.45 % IV SOLN
INTRAVENOUS | Status: DC
  Administered 2015-05-21 – 2015-05-26 (×5): via INTRAVENOUS
  Filled 2015-05-21 (×10): qty 1000

## 2015-05-21 MED ORDER — HEPARIN SODIUM (PORCINE) 5000 UNIT/ML IJ SOLN
5000.0000 [IU] | Freq: Three times a day (TID) | INTRAMUSCULAR | Status: DC
  Administered 2015-05-21 – 2015-05-22 (×4): 5000 [IU] via SUBCUTANEOUS
  Filled 2015-05-21 (×8): qty 1

## 2015-05-21 MED ORDER — EZETIMIBE 10 MG PO TABS
10.0000 mg | ORAL_TABLET | Freq: Every day | ORAL | Status: DC
  Administered 2015-05-21 – 2015-05-26 (×5): 10 mg via ORAL
  Filled 2015-05-21 (×6): qty 1

## 2015-05-21 MED ORDER — THIAMINE HCL 100 MG/ML IJ SOLN
100.0000 mg | Freq: Every day | INTRAMUSCULAR | Status: DC
  Filled 2015-05-21 (×3): qty 1

## 2015-05-21 MED ORDER — METOPROLOL SUCCINATE ER 25 MG PO TB24
25.0000 mg | ORAL_TABLET | Freq: Every day | ORAL | Status: DC
  Administered 2015-05-21 – 2015-05-24 (×4): 25 mg via ORAL
  Filled 2015-05-21 (×4): qty 1

## 2015-05-21 MED ORDER — ONDANSETRON HCL 4 MG PO TABS
4.0000 mg | ORAL_TABLET | Freq: Four times a day (QID) | ORAL | Status: DC | PRN
Start: 2015-05-21 — End: 2015-05-26

## 2015-05-21 MED ORDER — FOLIC ACID 1 MG PO TABS
1.0000 mg | ORAL_TABLET | Freq: Every day | ORAL | Status: DC
  Administered 2015-05-21 – 2015-05-26 (×5): 1 mg via ORAL
  Filled 2015-05-21 (×6): qty 1

## 2015-05-21 MED ORDER — INSULIN ASPART 100 UNIT/ML ~~LOC~~ SOLN: 0.0000 [IU] | Freq: Every day | SUBCUTANEOUS | Status: DC

## 2015-05-21 MED ORDER — VITAMIN B-1 100 MG PO TABS
100.0000 mg | ORAL_TABLET | Freq: Every day | ORAL | Status: DC
  Administered 2015-05-21 – 2015-05-26 (×5): 100 mg via ORAL
  Filled 2015-05-21 (×6): qty 1

## 2015-05-21 MED ORDER — LORAZEPAM 1 MG PO TABS
0.0000 mg | ORAL_TABLET | Freq: Four times a day (QID) | ORAL | Status: AC
  Administered 2015-05-22 (×2): 1 mg via ORAL
  Filled 2015-05-21 (×2): qty 1

## 2015-05-21 MED ORDER — INSULIN ASPART 100 UNIT/ML ~~LOC~~ SOLN
0.0000 [IU] | Freq: Three times a day (TID) | SUBCUTANEOUS | Status: DC
  Administered 2015-05-24: 2 [IU] via SUBCUTANEOUS
  Administered 2015-05-24 (×2): 3 [IU] via SUBCUTANEOUS
  Administered 2015-05-25 (×2): 2 [IU] via SUBCUTANEOUS
  Administered 2015-05-25: 3 [IU] via SUBCUTANEOUS
  Administered 2015-05-26: 2 [IU] via SUBCUTANEOUS

## 2015-05-21 MED ORDER — SODIUM CHLORIDE 0.9 % IJ SOLN
3.0000 mL | Freq: Two times a day (BID) | INTRAMUSCULAR | Status: DC
  Administered 2015-05-21 – 2015-05-25 (×5): 3 mL via INTRAVENOUS

## 2015-05-21 MED ORDER — ADULT MULTIVITAMIN W/MINERALS CH
1.0000 | ORAL_TABLET | Freq: Every day | ORAL | Status: DC
  Administered 2015-05-21 – 2015-05-26 (×5): 1 via ORAL
  Filled 2015-05-21 (×6): qty 1

## 2015-05-21 MED ORDER — LORAZEPAM 1 MG PO TABS: 1.0000 mg | ORAL_TABLET | Freq: Four times a day (QID) | ORAL | Status: AC | PRN

## 2015-05-21 MED ORDER — LORAZEPAM 1 MG PO TABS: 0.0000 mg | ORAL_TABLET | Freq: Two times a day (BID) | ORAL | Status: AC

## 2015-05-21 MED ORDER — ONDANSETRON HCL 4 MG/2ML IJ SOLN
4.0000 mg | Freq: Four times a day (QID) | INTRAMUSCULAR | Status: DC | PRN
  Administered 2015-05-23: 4 mg via INTRAVENOUS

## 2015-05-21 MED ORDER — MORPHINE SULFATE (PF) 2 MG/ML IV SOLN
2.0000 mg | INTRAVENOUS | Status: DC | PRN
  Administered 2015-05-21 – 2015-05-26 (×12): 2 mg via INTRAVENOUS
  Filled 2015-05-21 (×10): qty 1

## 2015-05-21 MED ORDER — SODIUM CHLORIDE 0.9 % IV SOLN
3.0000 g | Freq: Four times a day (QID) | INTRAVENOUS | Status: DC
  Administered 2015-05-22 (×2): 3 g via INTRAVENOUS
  Filled 2015-05-21 (×4): qty 3

## 2015-05-21 MED ORDER — LOSARTAN POTASSIUM 50 MG PO TABS
50.0000 mg | ORAL_TABLET | Freq: Every day | ORAL | Status: DC
  Administered 2015-05-21 – 2015-05-26 (×6): 50 mg via ORAL
  Filled 2015-05-21 (×6): qty 1

## 2015-05-21 MED ORDER — FERROUS SULFATE 325 (65 FE) MG PO TABS
325.0000 mg | ORAL_TABLET | Freq: Every day | ORAL | Status: DC
  Administered 2015-05-22 – 2015-05-26 (×4): 325 mg via ORAL
  Filled 2015-05-21 (×6): qty 1

## 2015-05-21 MED ORDER — LORAZEPAM 2 MG/ML IJ SOLN: 1.0000 mg | Freq: Four times a day (QID) | INTRAMUSCULAR | Status: AC | PRN

## 2015-05-21 MED ORDER — AMLODIPINE BESYLATE 5 MG PO TABS
5.0000 mg | ORAL_TABLET | Freq: Every day | ORAL | Status: DC
  Administered 2015-05-21 – 2015-05-26 (×6): 5 mg via ORAL
  Filled 2015-05-21 (×6): qty 1

## 2015-05-21 MED ORDER — HYDROMORPHONE HCL 1 MG/ML IJ SOLN
1.0000 mg | INTRAMUSCULAR | Status: DC | PRN
  Administered 2015-05-21 – 2015-05-23 (×3): 1 mg via INTRAVENOUS
  Filled 2015-05-21 (×3): qty 1

## 2015-05-21 MED ORDER — PANTOPRAZOLE SODIUM 40 MG PO TBEC
40.0000 mg | DELAYED_RELEASE_TABLET | Freq: Every day | ORAL | Status: DC
  Administered 2015-05-21 – 2015-05-26 (×5): 40 mg via ORAL
  Filled 2015-05-21 (×6): qty 1

## 2015-05-21 MED ORDER — AMPICILLIN-SULBACTAM SODIUM 3 (2-1) G IJ SOLR
3.0000 g | Freq: Once | INTRAMUSCULAR | Status: AC
  Administered 2015-05-21: 3 g via INTRAVENOUS
  Filled 2015-05-21: qty 3

## 2015-05-21 MED ORDER — BISACODYL 10 MG RE SUPP: 10.0000 mg | Freq: Every day | RECTAL | Status: DC | PRN

## 2015-05-21 NOTE — Telephone Encounter (Signed)
Patient with abdominal pain that started abruptly this am.  He has stomach pain that is now radiating up and into his back.  He has a history of biliary obstruction.  He will come in today and see Alonza Bogus, PA at 2:00

## 2015-05-21 NOTE — Telephone Encounter (Signed)
Per Alonza Bogus, PA patient will come in at 12:30 and get CBC, lipase, and LFTs.  Wife is aware

## 2015-05-21 NOTE — H&P (Signed)
[facility] History and Physical  ERNESTO ZUKOWSKI NTI:[id number] DOB: 1935/11/30 DOA: 05/21/2015  Referring physician: Velora Heckler [facility] PCP: [name], MD  Specialists:   Chief Complaint: Abd pain  HPI: [name] is a 79 y.o. male  With a hx of CAD s/p CABG, biliary obstruction s/p stenting x 2, AAA, DM2, HTN, HLD who presents from GI clinic with worsening abd pains. Pt previously not considered surgical candidate secondary to cardiac stent placement in 2015 requiring continued antiplatelet and subsequently underwent biliary stenting x 2. Pt noted to have alk phos of 32 with lipase of 31 and total bili of 0.8. Abd [place] with findings of biliary sludge and CBD of 72m and no sonographic evidence of Murphy's sign. [facility]. Currently, pt reports continued generalized abd discomfort with nausea. No chest pain or SOB.  Review of Systems:  Review of Systems  Constitutional: Negative for fever and chills.  HENT: Negative for hearing loss and tinnitus.   Eyes: Negative for pain and discharge.  Respiratory: Negative for shortness of breath and wheezing.   Cardiovascular: Negative for chest pain, palpitations and claudication.  Gastrointestinal: Positive for nausea and abdominal pain.  Genitourinary: Negative for urgency and frequency.  Musculoskeletal: Negative for joint pain and falls.  Neurological: Negative for tremors, seizures and loss of consciousness.  Psychiatric/Behavioral: Negative for memory loss and substance abuse.     Past Medical History  Diagnosis Date  . History of: ST elevation myocardial infarction (STEMI) involving left circumflex coronary artery with complication 18676   PTCA-circumflex; PCI in 1991  . CAD, multiple vessel 1985-2010    Most recent cath 01/01/09: 100% Occluded LAD after SP1, patent LIMA-distal LAD with apical 90% lesion.  Cx-OM1 widely patent stent into proximal  bifurcating OM1 . Follow-on Cx => 70-80% -- non-amenable PCI. Widely patent 3 overlapping stents in midRCA with only 40% RPL stenosis; SVG- OM known to be occluded.  . S/P CABG x 2 1997    LIMA-LAD, SVG-OM  . H/O unstable angina 06/2003, 12/2006, 05/2014    a) '04: Staged Taxus DES PCI to RCA and Cx-OM;;'08 - PCI to proximal ISR in RCA - Cypher DES; c) 05/2014: ISR in RCA & OM1, PCI with Promus P DES: RCA  2.75 x 12, OM1 3.0 mm x 8)  . History of nuclear stress test December 2013    LOW at Risk. Moderate region of mid to basal inferolateral scar without ischemia -- consistent with distal circumflex disease. Mild apical hypokinesis with an EF of 47%.  . Irregular heart beat     PVCs and PACs, no arrhythmia recorded  . Exertional dyspnea     Chronic baseline SOB with ambulation  . Sleep apnea     pt. states he was told to return for f/u, to be fitted for CPAp, but pt. reports that he didn't follow up  . AAA (abdominal aortic aneurysm) 08/30/12    Doppler 08/30/12 had a small amount of growth. It was a 4.2 x 4.3 cm greater in size than the previous one noted at 4 x 3.8.  . Diabetes mellitus     Not on oral medication  . Hypertension   . Dyslipidemia, goal LDL below 70   . Osteoarthritis of both knees     And back; multiple back surgeries, right knee arthroplasty and left knee arthroscopic surgery x2  . Chronic back pain      multiple surgeries; C-spine and  lumbar  . Erectile dysfunction   . H/O: pneumonia February 14  . Chronic anemia     On iron supplement; history of positive guaiac - negative colonoscopy in 1996.; Thought to be related to hemorrhoids; status post hemorrhoidectomy  . Ankle edema     Chronic  . Diverticulitis of colon 1996  . Choledocholithiasis   . Hemorrhoids   . Diverticulosis   . Adenomatous colon polyp   . Hiatal hernia   . Cholelithiasis - with cholangitis     status post ERCP with removal of calculi and biliary stent placement.   Past Surgical History   Procedure Laterality Date  . Coronary angioplasty  [id number]    1985 lateral STEMI Circumflex PTCA  . Anterior lat lumbar fusion Left 11/22/2012    Procedure: ANTERIOR LATERAL LUMBAR FUSION 1 LEVEL;  Surgeon: Eustace Moore, MD;  Location: [place] [facility];  Service: Neurosurgery;  Laterality: Left;  Anterior Lateral Lumbar Fusion Lumbar Three-Four  . Lumbar percutaneous pedicle screw 1 level N/A 11/22/2012    Procedure: LUMBAR PERCUTANEOUS PEDICLE SCREW 1 LEVEL;  Surgeon: Eustace Moore, MD;  Location: [place] [facility];  Service: Neurosurgery;  Laterality: N/A;  Lumbar Three-Four Percutaneous Pedicle Screw, Lateral approach  . Back surgery  1979 & x 10    pt. remarks, "I have had about 10 back surgeries"  . Posterior cervical fusion/foraminotomy N/A 05/02/2013    Procedure: POSTERIOR CERVICAL FUSION/FORAMINOTOMY CERVICAL SEVEN THORACIC-ONE;  Surgeon: Eustace Moore, MD;  Location: [place] [facility];  Service: Neurosurgery;  Laterality: N/A;  POSTERIOR CERVICAL FUSION/FORAMINOTOMY CERVICAL SEVEN THORACIC-ONE  . Coronary artery bypass graft  1990    LIMA-LAD, SVG-OM  . Cardiac catheterization  September 2004    None Occluded vein graft to OM; diffuse RCA disease in the mid vessel, 80% circumflex-OM stenosis; follow on AV groove circumflex with sequential 90% stenoses and intervening saccular dilation   . Percutaneous coronary stent intervention (pci-s)  September 2004    PCI - RCA 2 overlapping Taxus DES 2.75 mm x 32 mm and 2.75 mm x 12 mm (3.0 mm); PCI-Cx-OM1 - Taxus DES 3.0 mm x 20 mm (3.1 mm);   Marland Kitchen Percutaneous coronary stent intervention (pci-s)  March 2008    80% ISR in proximal Taxus stent in RCA -- covered proximally with Cypher DES 3.0 mm x 12 mm  . Cardiac catheterization  March 2010    4 abnormal Myoview showing apical thinning (possibly due to apical LAD 95%) : 100% Occluded LAD after SV1, distal LAD grafted via LIMA -apical 95% . Cx -OM1 w/patent stent extending into OM 1 . Follow on Cx - 70-80% -  non-amenable PCI. RCA widely patent 3 overlapping stents in mRCA w/less than 40% stenosisin RPL; SVG-OM known occluded   . Cervical discectomy  04/23/10    Decompressive anterior carvical diskectomy. C4-5, C6-7  . Cervical arthrodesis  04/23/10    Anterior cervical arthrodesis, C4-5, C6-7 utilizing 7-mm PEEK interbody cage packed with local autograft & Antifuse putty at C4-5 & an 8-mm cage at C6-7.  Marland Kitchen Anterior cervical plating  04/23/10    At C4-5 and a C6-7 utilizing two separate Biomet MaxAn plates.  . Colonoscopy  1996  . Minor hemorrhoidectomy    . Total knee arthroplasty Right   . Knee arthroscopy Left     x 2  . Nm myoview ltd  December 2013    LOW RISK. Mmoderate region of mid to basal inferolateral scar without ischemia. Mild apical hypokinesis with  an EF of 47%.  . Transthoracic echocardiogram  December 2013    EF 55-60%. moderate LA dilation. Aortic Sclerosis  . Abdominal and lower extremity arterial ultrasound  08/23/2012; 10/10/2013    Normal ABIs. Nonocclusive lower extremity disease. 4.2 cm x 4.3 cm infrarenal AAA;; 4.4 cm x 4.3 cm (essentially stable)   . Cataract extraction    . Ercp N/A 07/03/2014    Procedure: ENDOSCOPIC RETROGRADE CHOLANGIOPANCREATOGRAPHY (ERCP);  Surgeon: Inda Castle, MD;  Location: [place];  Service: Endoscopy;  Laterality: N/A;  . Biliary stent placement N/A 07/03/2014    Procedure: BILIARY STENT PLACEMENT;  Surgeon: Inda Castle, MD;  Location: [place];  Service: Endoscopy;  Laterality: N/A;  . Ercp N/A 07/05/2014    Procedure: ENDOSCOPIC RETROGRADE CHOLANGIOPANCREATOGRAPHY (ERCP);  Surgeon: Inda Castle, MD;  Location: [place];  Service: Endoscopy;  Laterality: N/A;  . Left heart catheterization with coronary angiogram N/A 05/15/2014    Procedure: LEFT HEART CATHETERIZATION WITH CORONARY ANGIOGRAM;  Surgeon: Sinclair Grooms, MD;  Location: [facility] CATH LAB;  Service: Cardiovascular;  Laterality: N/A;  . Percutaneous coronary stent  intervention (pci-s) N/A 05/17/2014    Procedure: PERCUTANEOUS CORONARY STENT INTERVENTION (PCI-S);  Surgeon: Sinclair Grooms, MD;  Location: [facility] CATH LAB;  Service: Cardiovascular;  Laterality: N/A;  . Left heart catheterization with coronary/graft angiogram N/A 06/28/2014    Procedure: LEFT HEART CATHETERIZATION WITH Beatrix Fetters;  Surgeon: Troy Sine, MD;  Location: [facility] CATH LAB;  Service: Cardiovascular;  Laterality: N/A;   Social History:  reports that he quit smoking about 31 years ago. His smoking use included Cigarettes. He has never used smokeless tobacco. He reports that he drinks about 1.8 oz of alcohol per week. He reports that he does not use illicit drugs.  where does patient live--home, ALF, SNF? and with whom if at home?  Can patient participate in ADLs?  Allergies  Allergen Reactions  . Lisinopril Cough    Rxn: unknown  . Statins Other (See Comments)    Muscle ache  . Welchol [Colesevelam Hcl] Itching    Family History  Problem Relation Age of Onset  . Arthritis Mother   . Diabetes Father   . Heart disease Father   . Stroke Sister   . Hypertension Sister   . Heart disease Sister   . Diabetes Sister   . Breast cancer Sister   . Heart disease Brother   . Ulcers Brother   . Colon cancer Neg Hx     (be sure to complete)  Prior to Admission medications   Medication Sig Start Date End Date Taking? Authorizing Provider  acetaminophen (TYLENOL) 500 MG tablet Take 1,000 mg by mouth every 6 (six) hours as needed for mild pain, moderate pain or headache.   Yes Historical Provider, MD  amLODipine (NORVASC) 5 MG tablet TAKE ONE TABLET BY MOUTH ONCE DAILY Patient taking differently: TAKE 5 MG BY MOUTH DAILY 02/20/15  Yes Leonie Man, MD  aspirin 81 MG tablet Take 81 mg by mouth daily.   Yes Historical Provider, MD  Choline Fenofibrate (FENOFIBRIC ACID) 135 MG CPDR TAKE ONE CAPSULE BY MOUTH ONCE DAILY Patient taking differently: TAKE 135 MG BY MOUTH DAILY  01/20/15  Yes Leonie Man, MD  clopidogrel (PLAVIX) 75 MG tablet TAKE ONE TABLET BY MOUTH ONCE DAILY Patient taking differently: TAKE 75 MG BY MOUTH ONCE DAILY 01/10/15  Yes Leonie Man, MD  ezetimibe (ZETIA) 10 MG tablet Take 1 tablet (  10 mg total) by mouth daily. 11/11/14  Yes Leonie Man, MD  ferrous sulfate 325 (65 FE) MG tablet Take 325 mg by mouth daily with breakfast.   Yes Historical Provider, MD  glucose blood test strip 1 each by Other route 2 (two) times daily. THIS IS FOR THE TRUE TRACK METERUse as instructed 06/25/14  Yes Denita Lung, MD  losartan (COZAAR) 50 MG tablet Take 50 mg by mouth daily.   Yes Historical Provider, MD  metoprolol succinate (TOPROL-XL) 25 MG 24 hr tablet Take 1 tablet (25 mg total) by mouth daily. 07/11/14  Yes Domenic Polite, MD  Multiple Vitamins-Minerals (MULTIVITAMIN WITH MINERALS) tablet Take 1 tablet by mouth daily.    Yes Historical Provider, MD  oxyCODONE (OXY IR/ROXICODONE) 5 MG immediate release tablet Take 1 tablet (5 mg total) by mouth every 6 (six) hours as needed for moderate pain. Patient taking differently: Take 5 mg by mouth every 6 (six) hours as needed for moderate pain. PRN 07/11/14  Yes Domenic Polite, MD  pantoprazole (PROTONIX) 40 MG tablet TAKE ONE TABLET BY MOUTH ONCE DAILY Patient taking differently: TAKE 40 MG BY MOUTH DAILY 02/04/15  Yes Leonie Man, MD  pioglitazone-metformin (ACTOPLUS MET) 15-500 MG per tablet TAKE ONE TABLET BY MOUTH ONCE DAILY 05/02/15  Yes Denita Lung, MD  sildenafil (REVATIO) 20 MG tablet Take 2-5 pills daily as needed Patient taking differently: Take 40-100 mg by mouth daily as needed (erectile dysfunction). Take 2-5 pills daily as needed 10/08/14  Yes Denita Lung, MD   Physical Exam: Filed Vitals:   05/21/15 1600  BP: 154/64  Pulse: 72  Temp: 98.2 F (36.8 C)  TempSrc: Oral  Resp: 16  Height: _0  (1.753 m)  Weight: 95.057 kg (209 lb 9 oz)  SpO2: 98%     General:  Awake, in  nad  Eyes: PERRL B  ENT: membranes moist, dentition fair  Neck: trachea midline, neck supple  Cardiovascular: regular, s1, s2  Respiratory: normal resp effort, no wheezing  Abdomen: soft,nondistended  Skin: normal skin turgor, no abnormal skin lesions seen  Musculoskeletal: perfused,no clubbing  Psychiatric: mood/affect normal// no auditory/visual hallucinations  Neurologic: cn2-12 grossly intact, strength/sensation intact  Labs on Admission:  Basic Metabolic Panel: No results for input(s): NA, K, CL, CO2, GLUCOSE, BUN, CREATININE, CALCIUM, MG, PHOS in the last 168 hours. Liver Function Tests:  Recent Labs Lab 05/21/15 1018  AST 60*  ALT 32  ALKPHOS 32*  BILITOT 0.8  PROT 6.5  ALBUMIN 3.9    Recent Labs Lab 05/21/15 1018  LIPASE 31.0   No results for input(s): AMMONIA in the last 168 hours. CBC:  Recent Labs Lab 05/21/15 1018  WBC 9.0  NEUTROABS 7.2  HGB 11.3*  HCT 34.0*  MCV 94.8  PLT 185.0   Cardiac Enzymes: No results for input(s): CKTOTAL, CKMB, CKMBINDEX, TROPONINI in the last 168 hours.  BNP (last 3 results) No results for input(s): BNP in the last 8760 hours.  ProBNP (last 3 results)  Recent Labs  06/27/14 1954 07/04/14 1300  PROBNP 333.0 397.2    CBG: No results for input(s): GLUCAP in the last 168 hours.  Radiological Exams on Admission: US Abdomen Complete  05/21/2015   CLINICAL DATA:  Acute abdominal pain, biliary stent  EXAM: ULTRASOUND ABDOMEN COMPLETE  COMPARISON:  MRI abdomen dated 06/30/2014  FINDINGS: Gallbladder: Gallbladder sludge. Pericholecystic fluid. Negative sonographic Murphy's sign.  Common bile duct: Diameter: 12 mm.  Liver: No focal lesion  identified. Within normal limits in parenchymal echogenicity. Intrahepatic ductal dilatation.  IVC: No abnormality visualized.  Pancreas: Not discretely visualized.  Spleen: Size and appearance within normal limits.  Right Kidney: Length: 9.0 cm.  No mass or hydronephrosis.   Left Kidney: Length: 12.0 cm.  No mass or hydronephrosis.  Abdominal aorta: 4.9 cm lower abdominal aortic aneurysm.  Other findings: None.  IMPRESSION: Mild intrahepatic/extrahepatic ductal dilatation. Common duct measures 12 mm.  Layering gallbladder sludge. No sonographic findings to suggest acute cholecystitis.  4.9 cm lower abdominal aortic aneurysm. Recommend followup by abdomen and pelvis CTA in 6 months, and vascular surgery referral/consultation if not already obtained. This recommendation follows ACR consensus guidelines: White Paper of the ACR Incidental Findings Committee II on Vascular Findings. J Am Coll Radiol 2013; 10:789-794.   Electronically Signed   By: Julian Hy M.D.   On: 05/21/2015 17:06     Assessment/Plan Principal Problem:   Biliary obstruction Active Problems:   Type II diabetes mellitus with complication -- CAD, AAA   Hypertension associated with diabetes   Hyperlipidemia LDL goal <70; statin intolerant   Chronic diastolic CHF (congestive heart failure)   S/P CABG x 2: 1997. SVG-OM (known to be occluded), LIMA-LAD   1. Biliary obstruction 1. Presented from GI clinic with worsening abd pain with prior stenting x 2. Abd [place] with evidence of obstruction with 2m CBD. GI following, who is to d/w General Surgery. 2. Will hold aspirin and plavix (last taken 8/16) 3. Would consult Cardiology in the AM for cardiac clearance 2. DM2 1. Will continue on SSI coverage for now 2. Recent a1c from 02/06/15 noted to be 6.2% 3. CAD s/p CABG 1. Currently chest pain free 2. Will obtain EKG 3. Per above, will hold aspirin and plavix 4. Would consult Cardiology in AM for cardiac clearance 4. HTN 1. BP stable 2. Will cont home meds 5. HLD 1. On zetia prior to admit 6. Chronic diastolic CHF 1. Most recent echo in 2013 with normal LVEF 2. Clinically euvolemic 7. Daily ETOH use 1. Reports drinking 4-5 beers daily, last use was on 8/16 2. Will continue on  CIWA 8. AAA 1. Abd UKoreademonstrated a 4.9cm lower AAA with recs for follow up CTA abd/pelvis in 6 months 2. Discussed case with on-call Vascular Surgeon, Dr LKellie Simmeringwho recommends CT abd/pelvis to r/o AAA leak. If neg, recommendation to follow as outpatient with Vascular Surgery in 6 months 9. DVT prophylaxis 1. Heparin subq  Code Status: Full  Family Communication: Pt in room Disposition Plan: Admit to med-tele    Charniece Venturino, SOrpah MelterTri[facility] Hospitalists Pager [phone] If 7PM-7AM, please contact night-coverage [email or web address] Password [email or web address] Ltd8/17/2016, 5:53 PM

## 2015-05-21 NOTE — Addendum Note (Signed)
Addended by: Marlon Pel on: 05/21/2015 09:18 AM   Modules accepted: Orders

## 2015-05-21 NOTE — Progress Notes (Signed)
ANTIBIOTIC CONSULT NOTE - INITIAL  Pharmacy Consult for Unasyn Indication: Intra-abdominal infection  Allergies  Allergen Reactions  . Lisinopril Cough    Rxn: unknown  . Statins Other (See Comments)    Muscle ache  . Welchol [Colesevelam Hcl] Itching    Patient Measurements: Height: 5\' 9"  (175.3 cm) Weight: 209 lb 9 oz (95.057 kg) IBW/kg (Calculated) : 70.7 Adjusted Body Weight:   Vital Signs: Temp: 98.2 F (36.8 C) (08/17 1600) Temp Source: Oral (08/17 1600) BP: 154/64 mmHg (08/17 1600) Pulse Rate: 72 (08/17 1600) Intake/Output from previous day:   Intake/Output from this shift:    Labs:  Recent Labs  05/21/15 1018  WBC 9.0  HGB 11.3*  PLT 185.0   CrCl cannot be calculated (Patient has no serum creatinine result on file.). No results for input(s): VANCOTROUGH, VANCOPEAK, VANCORANDOM, GENTTROUGH, GENTPEAK, GENTRANDOM, TOBRATROUGH, TOBRAPEAK, TOBRARND, AMIKACINPEAK, AMIKACINTROU, AMIKACIN in the last 72 hours.   Microbiology: No results found for this or any previous visit (from the past 720 hour(s)).  Medical History: Past Medical History  Diagnosis Date  . History of: ST elevation myocardial infarction (STEMI) involving left circumflex coronary artery with complication 1607    PTCA-circumflex; PCI in 1991  . CAD, multiple vessel 1985-2010    Most recent cath 01/01/09: 100% Occluded LAD after SP1, patent LIMA-distal LAD with apical 90% lesion.  Cx-OM1 widely patent stent into proximal bifurcating OM1 . Follow-on Cx => 70-80% -- non-amenable PCI. Widely patent 3 overlapping stents in midRCA with only 40% RPL stenosis; SVG- OM known to be occluded.  . S/P CABG x 2 1997    LIMA-LAD, SVG-OM  . H/O unstable angina 06/2003, 12/2006, 05/2014    a) '04: Staged Taxus DES PCI to RCA and Cx-OM;;'08 - PCI to proximal ISR in RCA - Cypher DES; c) 05/2014: ISR in RCA & OM1, PCI with Promus P DES: RCA  2.75 x 12, OM1 3.0 mm x 8)  . History of nuclear stress test December 2013     LOW at Risk. Moderate region of mid to basal inferolateral scar without ischemia -- consistent with distal circumflex disease. Mild apical hypokinesis with an EF of 47%.  . Irregular heart beat     PVCs and PACs, no arrhythmia recorded  . Exertional dyspnea     Chronic baseline SOB with ambulation  . Sleep apnea     pt. states he was told to return for f/u, to be fitted for CPAp, but pt. reports that he didn't follow up  . AAA (abdominal aortic aneurysm) 08/30/12    Doppler 08/30/12 had a small amount of growth. It was a 4.2 x 4.3 cm greater in size than the previous one noted at 4 x 3.8.  . Diabetes mellitus     Not on oral medication  . Hypertension   . Dyslipidemia, goal LDL below 70   . Osteoarthritis of both knees     And back; multiple back surgeries, right knee arthroplasty and left knee arthroscopic surgery x2  . Chronic back pain      multiple surgeries; C-spine and lumbar  . Erectile dysfunction   . H/O: pneumonia February 14  . Chronic anemia     On iron supplement; history of positive guaiac - negative colonoscopy in 1996.; Thought to be related to hemorrhoids; status post hemorrhoidectomy  . Ankle edema     Chronic  . Diverticulitis of colon 1996  . Choledocholithiasis   . Hemorrhoids   . Diverticulosis   .  Adenomatous colon polyp   . Hiatal hernia   . Cholelithiasis - with cholangitis     status post ERCP with removal of calculi and biliary stent placement.   Assessment: 83 yoM with PMHx CAD s/p CABG and biliary obstruction with prior stenting x 2 presents from GI clinic with worsening abdominal pains. Abd US shows evidence of biliary obstruction. Antiplatelets on hold and cardiology consulted for cardiac clearance for possible surgery to remove stent, perform sphincterotomy, and have cholecystectomy.  Pharmacy consulted to start Unasyn for possible intra-abdominal infection.  Anti-infectives 8/17 >> Unasyn  >>  Vitals/Labs WBC: Elevated  13.7K Tm24h:Afebrile SCr: 1.30, CrCl ~55 ml/min (N48)  Cultures 8/17 bloodx2: ordered  Goal of Therapy:  Eradication of infection  Plan:  Unasyn 3g IV q6h F/u renal fxn, cultures, clinical course  Ralene Bathe, PharmD, BCPS 05/21/2015, 9:24 PM  Pager: [phone] s

## 2015-05-21 NOTE — Progress Notes (Signed)
RN notified PCP on call to see if patient should travel to [place] with or without RN. Awaiting PCP on call to respond.

## 2015-05-21 NOTE — Progress Notes (Signed)
Reviewed and agree with management plan.  Janashia Parco T. Lelend Heinecke, MD FACG 

## 2015-05-21 NOTE — Addendum Note (Signed)
Addended by: Marlon Pel on: 05/21/2015 09:20 AM   Modules accepted: Orders

## 2015-05-21 NOTE — Progress Notes (Signed)
     05/21/2015 [name] [id number] 23-Feb-1936   History of Present Illness:  This is a 79 year old male who was admitted to the hospital for biliary obstruction/cholangitis in September 2015.    Was not a surgical candidate at that time due to Plavix requirement from cardiac stent placement in 05/2014.   --9/30 ERCP #1 with metal stent placement. Developed pancreatitis, cholangitis, stone occlusion of stent post-op   --10/2 ERCP # 2: Stent replaced, bile duct swept of stones and pus.  Plan was to follow LFT's every 3 months until September 2016 at which time cholecystectomy and stent removal would be re-discussed.  LFT's have been normal and patient had been feeling well until this morning when he developed epigastric and RUQ abdominal pain that woke him from sleep at 4 am.  Has been persistent since that time.  Some nausea.  Telling me that he is quite miserable and has 10/10 pain.  We had labs checked this AM; lipase was normal, LFT's with only slight bump in AST to 60.  No leukocytosis.  Did not take Plavix today.   Current Medications, Allergies, Past Medical History, Past Surgical History, Family History and Social History were reviewed in [employer] record.   Physical Exam: BP 134/70 mmHg  Pulse 68  Ht 5\' 9"  (1.753 m)  Wt 209 lb 9 oz (95.057 kg)  BMI 30.93 kg/m2 General: Well developed white male in no acute distress Head: Normocephalic and atraumatic Eyes:  Sclerae anicteric, conjunctiva pink  Ears: Normal auditory acuity Lungs: Clear throughout to auscultation Heart: Regular rate and rhythm Abdomen: Soft, non-distended.  Normal bowel sounds.  Moderate epigastric and RUQ TTP. Musculoskeletal: Symmetrical with no gross deformities  Extremities: No edema  Neurological: Alert oriented x 4, grossly non-focal Psychological:  Alert and cooperative. Normal mood and affect  Assessment and Recommendations: *Biliary obstruction due to  choledocholithiasis, cholelithiasis: Not a surgical candidate previously due to Plavix requirement from cardiac stent placement in 05/2014 (one year commitment).   --9/30 ERCP #1 with metal stent placement. Developed pancreatitis, cholangitis, stone occlusion of stent post-op   --10/2 ERCP # 2: Stent replaced, bile duct swept of stones and pus. Now with development of sudden onset abdominal pain, similar to previous.  Concern for early obstruction again.  Discussed with Dr. Fuller Plan.  Best option would be for hospitalization to receive IVF's, pain control, and IV antibiotics while waiting for Plavix to wash out to remove stent/perform sphincterotomy and have cholecystectomy.  Should have surgical and cardiology consults. * Unstable angina. Ruled out for MI. DES placed 05/17/14 and committed to one year of Plavix. On  81 ASA as well.

## 2015-05-22 ENCOUNTER — Encounter (HOSPITAL_COMMUNITY): Payer: Self-pay | Admitting: General Surgery

## 2015-05-22 DIAGNOSIS — K8010 Calculus of gallbladder with chronic cholecystitis without obstruction: Secondary | ICD-10-CM

## 2015-05-22 LAB — COMPREHENSIVE METABOLIC PANEL
ALT: 32 U/L (ref 17–63)
ANION GAP: 8 (ref 5–15)
AST: 35 U/L (ref 15–41)
Albumin: 3.4 g/dL — ABNORMAL LOW (ref 3.5–5.0)
Alkaline Phosphatase: 28 U/L — ABNORMAL LOW (ref 38–126)
BUN: 18 mg/dL (ref 6–20)
CHLORIDE: 106 mmol/L (ref 101–111)
CO2: 21 mmol/L — AB (ref 22–32)
CREATININE: 1.27 mg/dL — AB (ref 0.61–1.24)
Calcium: 8.6 mg/dL — ABNORMAL LOW (ref 8.9–10.3)
GFR, EST NON AFRICAN AMERICAN: 53 mL/min — AB (ref >60)
Glucose, Bld: 120 mg/dL — ABNORMAL HIGH (ref 65–99)
POTASSIUM: 3.7 mmol/L (ref 3.5–5.1)
SODIUM: 135 mmol/L (ref 135–145)
Total Bilirubin: 1.1 mg/dL (ref 0.3–1.2)
Total Protein: 6 g/dL — ABNORMAL LOW (ref 6.5–8.1)

## 2015-05-22 LAB — GLUCOSE, CAPILLARY
GLUCOSE-CAPILLARY: 104 mg/dL — AB (ref 65–99)
GLUCOSE-CAPILLARY: 98 mg/dL (ref 65–99)
GLUCOSE-CAPILLARY: 86 mg/dL (ref 65–99)
Glucose-Capillary: 102 mg/dL — ABNORMAL HIGH (ref 65–99)

## 2015-05-22 LAB — CBC
HEMATOCRIT: 32.6 % — AB (ref 39.0–52.0)
HEMOGLOBIN: 10.4 g/dL — AB (ref 13.0–17.0)
MCH: 30.3 pg (ref 26.0–34.0)
MCHC: 31.9 g/dL (ref 30.0–36.0)
MCV: 95 fL (ref 78.0–100.0)
PLATELETS: 166 10*3/uL (ref 150–400)
RBC: 3.43 MIL/uL — AB (ref 4.22–5.81)
RDW: 14.5 % (ref 11.5–15.5)
WBC: 15.1 10*3/uL — AB (ref 4.0–10.5)

## 2015-05-22 MED ORDER — PIPERACILLIN-TAZOBACTAM 3.375 G IVPB
3.3750 g | Freq: Three times a day (TID) | INTRAVENOUS | Status: DC
  Administered 2015-05-22 – 2015-05-26 (×12): 3.375 g via INTRAVENOUS
  Filled 2015-05-22 (×14): qty 50

## 2015-05-22 NOTE — Progress Notes (Signed)
[facility] Progress Note  Subjective:    WBC 15.1,bili 1.1.T 99.7.Pain is a 9 out of 10. Nauseous, has not vomited. Passing gas.CT with stone lodged in neck of gallbladder. On unasyn. Plavix was d/c'd yesterday, now on heparin.   Objective:  Vital signs in last 24 hours: Temp:  [98.2 F (36.8 C)-99.7 F (37.6 C)] 98.4 F (36.9 C) (08/18 0523) Pulse Rate:  [68-79] 79 (08/18 0523) Resp:  [16-24] 18 (08/18 0523) BP: (128-154)/(59-70) 128/59 mmHg (08/18 0523) SpO2:  [96 %-99 %] 96 % (08/18 0523) Weight:  [209 lb 9 oz (95.057 kg)] 209 lb 9 oz (95.057 kg) (08/17 1600) Last BM Date: 05/20/15 General: Well developed white male in no acute distress Head: Normocephalic and atraumatic Eyes: Sclerae anicteric, conjunctiva pink  Ears: Normal auditory acuity Lungs: Clear throughout to auscultation Heart: Regular rate and rhythm Abdomen: Soft, non-distended. Normal bowel sounds. Moderate epigastric and RUQ TTP. Musculoskeletal: Symmetrical with no gross deformities  Extremities: No edema  Neurological: Alert oriented x 4, grossly non-focal Psychological: Alert and cooperative. Normal mood and affect   Lab Results:  Recent Labs  05/21/15 1018 05/21/15 1905 05/22/15 0502  WBC 9.0 13.7* 15.1*  HGB 11.3* 10.8* 10.4*  HCT 34.0* 33.6* 32.6*  PLT 185.0 171 166   BMET  Recent Labs  05/21/15 1905 05/22/15 0502  NA 136 135  K 4.1 3.7  CL 106 106  CO2 24 21*  GLUCOSE 204* 120*  BUN 20 18  CREATININE 1.30*  1.30* 1.27*  CALCIUM 8.9 8.6*   LFT  Recent Labs  05/21/15 1018  05/22/15 0502  PROT 6.5  < > 6.0*  ALBUMIN 3.9  < > 3.4*  AST 60*  < > 35  ALT 32  < > 32  ALKPHOS 32*  < > 28*  BILITOT 0.8  < > 1.1  BILIDIR 0.4*  --   --   < > = values in this interval not displayed.  Ct Abdomen Pelvis Wo Contrast  05/21/2015   CLINICAL DATA:  Abdominal pain. Nausea. Bile duct obstruction with stenting. Cholelithiasis. Abdominal aortic aneurysm.  EXAM:  CT ABDOMEN AND PELVIS WITHOUT CONTRAST  TECHNIQUE: Multidetector CT imaging of the abdomen and pelvis was performed following the standard protocol without IV contrast.  COMPARISON:  ERCP dated 07/05/2014 and MRI of the abdomen dated 06/30/2014  FINDINGS: Lower chest: Minimal scarring at the lung bases. Large hiatal hernia. Extensive coronary artery calcification. Extensive calcification in the mitral valve annulus. Heart size is normal.  Hepatobiliary: There is a 5 mm stone in the neck of the gallbladder. There is edema of the wall of the distended gallbladder. No biliary ductal dilatation. Biliary stent in place which appears in good position. Small amount of air in the biliary tree of the left lobe of the liver. Liver parenchyma is otherwise normal.  Pancreas: Normal.  Spleen: Normal.  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 Right adrenal gland is normal. Chronic right renal atrophy. Left kidney is normal. Bladder is normal.  Stomach/Bowel: Large hiatal hernia. Multiple diverticula in the proximal sigmoid portion of the colon. Terminal ileum and appendix are normal.  Vascular/Lymphatic: 4.9 cm saccular abdominal aortic aneurysm. Extensive aortic atherosclerosis. No adenopathy.  Musculoskeletal: No acute abnormality. 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 Previous lumbar fusions.  IMPRESSION: 1. Diffuse edema of the wall of the distended gallbladder with a stone lodged in the neck of the gallbladder. This could represent acute  and possibly gangrenous cholecystitis. 2. Biliary stent appears in good position. 3. 4.9 cm saccular abdominal aortic aneurysm. 4. Large hiatal hernia.   Electronically Signed   By: Lorriane Shire M.D.   On: 05/21/2015 21:26   US Abdomen Complete  05/21/2015   CLINICAL DATA:  Acute abdominal pain, biliary stent  EXAM: ULTRASOUND ABDOMEN COMPLETE  COMPARISON:  MRI abdomen dated 06/30/2014  FINDINGS: Gallbladder: Gallbladder sludge.  Pericholecystic fluid. Negative sonographic Murphy's sign.  Common bile duct: Diameter: 12 mm.  Liver: No focal lesion identified. Within normal limits in parenchymal echogenicity. Intrahepatic ductal dilatation.  IVC: No abnormality visualized.  Pancreas: Not discretely visualized.  Spleen: Size and appearance within normal limits.  Right Kidney: Length: 9.0 cm.  No mass or hydronephrosis.  Left Kidney: Length: 12.0 cm.  No mass or hydronephrosis.  Abdominal aorta: 4.9 cm lower abdominal aortic aneurysm.  Other findings: None.  IMPRESSION: Mild intrahepatic/extrahepatic ductal dilatation. Common duct measures 12 mm.  Layering gallbladder sludge. No sonographic findings to suggest acute cholecystitis.  4.9 cm lower abdominal aortic aneurysm. Recommend followup by abdomen and pelvis CTA in 6 months, and vascular surgery referral/consultation if not already obtained. This recommendation follows ACR consensus guidelines: White Paper of the ACR Incidental Findings Committee II on Vascular Findings. J Am Coll Radiol 2013; 10:789-794.   Electronically Signed   By: Julian Hy M.D.   On: 05/21/2015 17:06    ASSESSMENT/PLAN:  Biliary obstruction due to choledocholithiasis, cholelithiasis: Not a surgical candidate previously due to Plavix requirement from cardiac stent placement in 05/2014 (one year commitment).  --9/30 ERCP #1 with metal stent placement. Developed pancreatitis, cholangitis, stone occlusion of stent post-op  --10/2 ERCP # 2: Stent replaced, bile duct swept of stones and pus. Now with development of sudden onset abdominal pain, similar to previous. WBC 15.1, febrile.On unasyn. Plaxix d/c'd yesterday, on heparin.Will ask surgery to eval. Re: cholecystectomy. Will also need ERCP with stent removal.    LOS: 1 day   Hyland Mollenkopf, Vita Barley PA-C 05/22/2015, Pager [phone]

## 2015-05-22 NOTE — Consult Note (Signed)
Reason for Consult: cholecystitis  Referring Physician: Dr. Erskine Emery   HPI: [name] is a 79 year old male with a history of CAD s/p CABG multiple stents most recent PCI 05/17/14 with recath 06/28/14 which revealed patent stents.  At that time he was found to have cholangitis and choledocholithiasis. The patient had an ERCP 07/03/14 with metal stent placement, post op stent occlusion.  Repeat ERCP and stent replaced on 07/05/14.  We then saw him in consult, he was not a surgical candidate given recent PCI/plavix.      He has been followed by GI every 3 months and was in his usual state of health until yesterday morning at which time he was wakened by severe right sided abdominal pain.  associated with nausea.  Denies vomiting, fever, chills or sweats.  Denies changes in bowel pattern.  Characterized as sharp pain.  Time pattern is constant.  Severe in severity.  Modifying factors include; analgesics without any help.  He was seen GI by and referred to the ED. Initial work up showed a normal white count and normal LFTs.  Abdominal US showed mild intrahepatic/extrahepatic ductal dilatation with CBD at 12 mm.  Layering gallbladder sludge.  CT scan showed diffuse edema, distended gallbladder wall with a stone in the neck of the galbladder.   We have therefore been asked to evaluate.  LFTs this AM remain normal.  WBC has increased to 15.1k.  The patient has been placed on Unasyn and remains NPO.   Reports the pain is better now, but returns with inspiration.    Last dose of Plavix was on 8/16 AM.    He admits to drinking 4-5 beers per day most days.  He denies any chest pains, SOB or DOE.    Past Medical History  Diagnosis Date  . History of: ST elevation myocardial infarction (STEMI) involving left circumflex coronary artery with complication 8563    PTCA-circumflex; PCI in 1991  . CAD, multiple vessel 1985-2010    Most recent cath 01/01/09: 100% Occluded LAD after SP1, patent LIMA-distal LAD  with apical 90% lesion.  Cx-OM1 widely patent stent into proximal bifurcating OM1 . Follow-on Cx => 70-80% -- non-amenable PCI. Widely patent 3 overlapping stents in midRCA with only 40% RPL stenosis; SVG- OM known to be occluded.  . S/P CABG x 2 1997    LIMA-LAD, SVG-OM  . H/O unstable angina 06/2003, 12/2006, 05/2014    a) '04: Staged Taxus DES PCI to RCA and Cx-OM;;'08 - PCI to proximal ISR in RCA - Cypher DES; c) 05/2014: ISR in RCA & OM1, PCI with Promus P DES: RCA  2.75 x 12, OM1 3.0 mm x 8)  . History of nuclear stress test December 2013    LOW at Risk. Moderate region of mid to basal inferolateral scar without ischemia -- consistent with distal circumflex disease. Mild apical hypokinesis with an EF of 47%.  . Irregular heart beat     PVCs and PACs, no arrhythmia recorded  . Exertional dyspnea     Chronic baseline SOB with ambulation  . Sleep apnea     pt. states he was told to return for f/u, to be fitted for CPAp, but pt. reports that he didn't follow up  . AAA (abdominal aortic aneurysm) 08/30/12    Doppler 08/30/12 had a small amount of growth. It was a 4.2 x 4.3 cm greater in size than the previous one noted at 4 x 3.8.  . Diabetes mellitus  Not on oral medication  . Hypertension   . Dyslipidemia, goal LDL below 70   . Osteoarthritis of both knees     And back; multiple back surgeries, right knee arthroplasty and left knee arthroscopic surgery x2  . Chronic back pain      multiple surgeries; C-spine and lumbar  . Erectile dysfunction   . H/O: pneumonia February 14  . Chronic anemia     On iron supplement; history of positive guaiac - negative colonoscopy in 1996.; Thought to be related to hemorrhoids; status post hemorrhoidectomy  . Ankle edema     Chronic  . Diverticulitis of colon 1996  . Choledocholithiasis   . Hemorrhoids   . Diverticulosis   . Adenomatous colon polyp   . Hiatal hernia   . Cholelithiasis - with cholangitis     status post ERCP with removal of  calculi and biliary stent placement.    Past Surgical History  Procedure Laterality Date  . Coronary angioplasty  [id number],[id number]    1985 lateral STEMI Circumflex PTCA  . Anterior lat lumbar fusion Left 11/22/2012    Procedure: ANTERIOR LATERAL LUMBAR FUSION 1 LEVEL;  Surgeon: Eustace Moore, MD;  Location: [facility];  Service: Neurosurgery;  Laterality: Left;  Anterior Lateral Lumbar Fusion Lumbar Three-Four  . Lumbar percutaneous pedicle screw 1 level N/A 11/22/2012    Procedure: LUMBAR PERCUTANEOUS PEDICLE SCREW 1 LEVEL;  Surgeon: Eustace Moore, MD;  Location: [facility];  Service: Neurosurgery;  Laterality: N/A;  Lumbar Three-Four Percutaneous Pedicle Screw, Lateral approach  . Back surgery  1979 & x 10    pt. remarks, "I have had about 10 back surgeries"  . Posterior cervical fusion/foraminotomy N/A 05/02/2013    Procedure: POSTERIOR CERVICAL FUSION/FORAMINOTOMY CERVICAL SEVEN THORACIC-ONE;  Surgeon: Eustace Moore, MD;  Location: [facility];  Service: Neurosurgery;  Laterality: N/A;  POSTERIOR CERVICAL FUSION/FORAMINOTOMY CERVICAL SEVEN THORACIC-ONE  . Coronary artery bypass graft  1990    LIMA-LAD, SVG-OM  . Cardiac catheterization  September 2004    None Occluded vein graft to OM; diffuse RCA disease in the mid vessel, 80% circumflex-OM stenosis; follow on AV groove circumflex with sequential 90% stenoses and intervening saccular dilation   . Percutaneous coronary stent intervention (pci-s)  September 2004    PCI - RCA 2 overlapping Taxus DES 2.75 mm x 32 mm and 2.75 mm x 12 mm (3.0 mm); PCI-Cx-OM1 - Taxus DES 3.0 mm x 20 mm (3.1 mm);   Marland Kitchen Percutaneous coronary stent intervention (pci-s)  March 2008    80% ISR in proximal Taxus stent in RCA -- covered proximally with Cypher DES 3.0 mm x 12 mm  . Cardiac catheterization  March 2010    4 abnormal Myoview showing apical thinning (possibly due to apical LAD 95%) : 100% Occluded LAD after SV1, distal LAD grafted via LIMA -apical 95% . Cx -OM1  w/patent stent extending into OM 1 . Follow on Cx - 70-80% - non-amenable PCI. RCA widely patent 3 overlapping stents in mRCA w/less than 40% stenosisin RPL; SVG-OM known occluded   . Cervical discectomy  04/23/10    Decompressive anterior carvical diskectomy. C4-5, C6-7  . Cervical arthrodesis  04/23/10    Anterior cervical arthrodesis, C4-5, C6-7 utilizing 7-mm PEEK interbody cage packed with local autograft & Antifuse putty at C4-5 & an 8-mm cage at C6-7.  Marland Kitchen Anterior cervical plating  04/23/10    At C4-5 and a C6-7 utilizing two separate Biomet MaxAn plates.  Marland Kitchen  Colonoscopy  1996  . Minor hemorrhoidectomy    . Total knee arthroplasty Right   . Knee arthroscopy Left     x 2  . Nm myoview ltd  December 2013    LOW RISK. Mmoderate region of mid to basal inferolateral scar without ischemia. Mild apical hypokinesis with an EF of 47%.  . Transthoracic echocardiogram  December 2013    EF 55-60%. moderate LA dilation. Aortic Sclerosis  . Abdominal and lower extremity arterial ultrasound  08/23/2012; 10/10/2013    Normal ABIs. Nonocclusive lower extremity disease. 4.2 cm x 4.3 cm infrarenal AAA;; 4.4 cm x 4.3 cm (essentially stable)   . Cataract extraction    . Ercp N/A 07/03/2014    Procedure: ENDOSCOPIC RETROGRADE CHOLANGIOPANCREATOGRAPHY (ERCP);  Surgeon: Inda Castle, MD;  Location: [facility];  Service: Endoscopy;  Laterality: N/A;  . Biliary stent placement N/A 07/03/2014    Procedure: BILIARY STENT PLACEMENT;  Surgeon: Inda Castle, MD;  Location: [facility];  Service: Endoscopy;  Laterality: N/A;  . Ercp N/A 07/05/2014    Procedure: ENDOSCOPIC RETROGRADE CHOLANGIOPANCREATOGRAPHY (ERCP);  Surgeon: Inda Castle, MD;  Location: [facility];  Service: Endoscopy;  Laterality: N/A;  . Left heart catheterization with coronary angiogram N/A 05/15/2014    Procedure: LEFT HEART CATHETERIZATION WITH CORONARY ANGIOGRAM;  Surgeon: Sinclair Grooms, MD;  Location: [facility] CATH LAB;  Service:  Cardiovascular;  Laterality: N/A;  . Percutaneous coronary stent intervention (pci-s) N/A 05/17/2014    Procedure: PERCUTANEOUS CORONARY STENT INTERVENTION (PCI-S);  Surgeon: Sinclair Grooms, MD;  Location: [facility] CATH LAB;  Service: Cardiovascular;  Laterality: N/A;  . Left heart catheterization with coronary/graft angiogram N/A 06/28/2014    Procedure: LEFT HEART CATHETERIZATION WITH Beatrix Fetters;  Surgeon: Troy Sine, MD;  Location: [facility] CATH LAB;  Service: Cardiovascular;  Laterality: N/A;    Family History  Problem Relation Age of Onset  . Arthritis Mother   . Diabetes Father   . Heart disease Father   . Stroke Sister   . Hypertension Sister   . Heart disease Sister   . Diabetes Sister   . Breast cancer Sister   . Heart disease Brother   . Ulcers Brother   . Colon cancer Neg Hx     Social History:  reports that he quit smoking about 31 years ago. His smoking use included Cigarettes. He has never used smokeless tobacco. He reports that he drinks about 1.8 oz of alcohol per week. He reports that he does not use illicit drugs.  Allergies:  Allergies  Allergen Reactions  . Lisinopril Cough    Rxn: unknown  . Statins Other (See Comments)    Muscle ache  . Welchol [Colesevelam Hcl] Itching    Medications:  Scheduled Meds: . amLODipine  5 mg Oral Daily  . ezetimibe  10 mg Oral Daily  . ferrous sulfate  325 mg Oral Q breakfast  . folic acid  1 mg Oral Daily  . heparin  5,000 Units Subcutaneous 3 times per day  . insulin aspart  0-15 Units Subcutaneous TID WC  . insulin aspart  0-5 Units Subcutaneous QHS  . LORazepam  0-4 mg Oral Q6H   Followed by  . [START ON 05/23/2015] LORazepam  0-4 mg Oral Q12H  . losartan  50 mg Oral Daily  . metoprolol succinate  25 mg Oral Daily  . multivitamin with minerals  1 tablet Oral Daily  . pantoprazole  40 mg Oral Daily  . piperacillin-tazobactam (  ZOSYN)  IV  3.375 g Intravenous 3 times per day  . sodium chloride  3 mL  Intravenous Q12H  . thiamine  100 mg Oral Daily   Or  . thiamine  100 mg Intravenous Daily   Continuous Infusions: . sodium chloride 0.45 % 1,000 mL with potassium chloride 20 mEq infusion 75 mL/hr at 05/21/15 1812   PRN Meds:.bisacodyl, HYDROmorphone (DILAUDID) injection, LORazepam **OR** LORazepam, morphine injection, ondansetron **OR** ondansetron (ZOFRAN) IV   Results for orders placed or performed during the hospital encounter of 05/21/15 (from the past 48 hour(s))  CBC     Status: Abnormal   Collection Time: 05/21/15  7:05 PM  Result Value Ref Range   WBC 13.7 (H) 4.0 - 10.5 K/uL   RBC 3.52 (L) 4.22 - 5.81 MIL/uL   Hemoglobin 10.8 (L) 13.0 - 17.0 g/dL   HCT 33.6 (L) 39.0 - 52.0 %   MCV 95.5 78.0 - 100.0 fL   MCH 30.7 26.0 - 34.0 pg   MCHC 32.1 30.0 - 36.0 g/dL   RDW 14.4 11.5 - 15.5 %   Platelets 171 150 - 400 K/uL  Creatinine, serum     Status: Abnormal   Collection Time: 05/21/15  7:05 PM  Result Value Ref Range   Creatinine, Ser 1.30 (H) 0.61 - 1.24 mg/dL   GFR calc non Af Amer 51 (L) >60 mL/min   GFR calc Af Amer 59 (L) >60 mL/min    Comment: (NOTE) The eGFR has been calculated using the CKD EPI equation. This calculation has not been validated in all clinical situations. eGFR's persistently <60 mL/min signify possible Chronic Kidney Disease.   Comprehensive metabolic panel     Status: Abnormal   Collection Time: 05/21/15  7:05 PM  Result Value Ref Range   Sodium 136 135 - 145 mmol/L   Potassium 4.1 3.5 - 5.1 mmol/L   Chloride 106 101 - 111 mmol/L   CO2 24 22 - 32 mmol/L   Glucose, Bld 204 (H) 65 - 99 mg/dL   BUN 20 6 - 20 mg/dL   Creatinine, Ser 1.30 (H) 0.61 - 1.24 mg/dL   Calcium 8.9 8.9 - 10.3 mg/dL   Total Protein 7.0 6.5 - 8.1 g/dL   Albumin 3.7 3.5 - 5.0 g/dL   AST 57 (H) 15 - 41 U/L   ALT 42 17 - 63 U/L   Alkaline Phosphatase 32 (L) 38 - 126 U/L   Total Bilirubin 0.4 0.3 - 1.2 mg/dL   GFR calc non Af Amer 51 (L) >60 mL/min   GFR calc Af Amer 59  (L) >60 mL/min    Comment: (NOTE) The eGFR has been calculated using the CKD EPI equation. This calculation has not been validated in all clinical situations. eGFR's persistently <60 mL/min signify possible Chronic Kidney Disease.    Anion gap 6 5 - 15  Glucose, capillary     Status: Abnormal   Collection Time: 05/21/15  8:41 PM  Result Value Ref Range   Glucose-Capillary 157 (H) 65 - 99 mg/dL  Comprehensive metabolic panel     Status: Abnormal   Collection Time: 05/22/15  5:02 AM  Result Value Ref Range   Sodium 135 135 - 145 mmol/L   Potassium 3.7 3.5 - 5.1 mmol/L   Chloride 106 101 - 111 mmol/L   CO2 21 (L) 22 - 32 mmol/L   Glucose, Bld 120 (H) 65 - 99 mg/dL   BUN 18 6 - 20 mg/dL   Creatinine,  Ser 1.27 (H) 0.61 - 1.24 mg/dL   Calcium 8.6 (L) 8.9 - 10.3 mg/dL   Total Protein 6.0 (L) 6.5 - 8.1 g/dL   Albumin 3.4 (L) 3.5 - 5.0 g/dL   AST 35 15 - 41 U/L   ALT 32 17 - 63 U/L   Alkaline Phosphatase 28 (L) 38 - 126 U/L   Total Bilirubin 1.1 0.3 - 1.2 mg/dL   GFR calc non Af Amer 53 (L) >60 mL/min   GFR calc Af Amer >60 >60 mL/min    Comment: (NOTE) The eGFR has been calculated using the CKD EPI equation. This calculation has not been validated in all clinical situations. eGFR's persistently <60 mL/min signify possible Chronic Kidney Disease.    Anion gap 8 5 - 15  CBC     Status: Abnormal   Collection Time: 05/22/15  5:02 AM  Result Value Ref Range   WBC 15.1 (H) 4.0 - 10.5 K/uL   RBC 3.43 (L) 4.22 - 5.81 MIL/uL   Hemoglobin 10.4 (L) 13.0 - 17.0 g/dL   HCT 32.6 (L) 39.0 - 52.0 %   MCV 95.0 78.0 - 100.0 fL   MCH 30.3 26.0 - 34.0 pg   MCHC 31.9 30.0 - 36.0 g/dL   RDW 14.5 11.5 - 15.5 %   Platelets 166 150 - 400 K/uL  Glucose, capillary     Status: Abnormal   Collection Time: 05/22/15  8:05 AM  Result Value Ref Range   Glucose-Capillary 104 (H) 65 - 99 mg/dL    Ct Abdomen Pelvis Wo Contrast  05/21/2015   CLINICAL DATA:  Abdominal pain. Nausea. Bile duct obstruction  with stenting. Cholelithiasis. Abdominal aortic aneurysm.  EXAM: CT ABDOMEN AND PELVIS WITHOUT CONTRAST  TECHNIQUE: Multidetector CT imaging of the abdomen and pelvis was performed following the standard protocol without IV contrast.  COMPARISON:  ERCP dated 07/05/2014 and MRI of the abdomen dated 06/30/2014  FINDINGS: Lower chest: Minimal scarring at the lung bases. Large hiatal hernia. Extensive coronary artery calcification. Extensive calcification in the mitral valve annulus. Heart size is normal.  Hepatobiliary: There is a 5 mm stone in the neck of the gallbladder. There is edema of the wall of the distended gallbladder. No biliary ductal dilatation. Biliary stent in place which appears in good position. Small amount of air in the biliary tree of the left lobe of the liver. Liver parenchyma is otherwise normal.  Pancreas: Normal.  Spleen: Normal.  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 Right adrenal gland is normal. Chronic right renal atrophy. Left kidney is normal. Bladder is normal.  Stomach/Bowel: Large hiatal hernia. Multiple diverticula in the proximal sigmoid portion of the colon. Terminal ileum and appendix are normal.  Vascular/Lymphatic: 4.9 cm saccular abdominal aortic aneurysm. Extensive aortic atherosclerosis. No adenopathy.  Musculoskeletal: No acute abnormality. 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 Previous lumbar fusions.  IMPRESSION: 1. Diffuse edema of the wall of the distended gallbladder with a stone lodged in the neck of the gallbladder. This could represent acute and possibly gangrenous cholecystitis. 2. Biliary stent appears in good position. 3. 4.9 cm saccular abdominal aortic aneurysm. 4. Large hiatal hernia.   Electronically Signed   By: Lorriane Shire M.D.   On: 05/21/2015 21:26   US Abdomen Complete  05/21/2015   CLINICAL DATA:  Acute abdominal pain, biliary stent  EXAM: ULTRASOUND ABDOMEN COMPLETE  COMPARISON:  MRI  abdomen dated 06/30/2014  FINDINGS: Gallbladder: Gallbladder sludge. Pericholecystic fluid. Negative  sonographic Murphy's sign.  Common bile duct: Diameter: 12 mm.  Liver: No focal lesion identified. Within normal limits in parenchymal echogenicity. Intrahepatic ductal dilatation.  IVC: No abnormality visualized.  Pancreas: Not discretely visualized.  Spleen: Size and appearance within normal limits.  Right Kidney: Length: 9.0 cm.  No mass or hydronephrosis.  Left Kidney: Length: 12.0 cm.  No mass or hydronephrosis.  Abdominal aorta: 4.9 cm lower abdominal aortic aneurysm.  Other findings: None.  IMPRESSION: Mild intrahepatic/extrahepatic ductal dilatation. Common duct measures 12 mm.  Layering gallbladder sludge. No sonographic findings to suggest acute cholecystitis.  4.9 cm lower abdominal aortic aneurysm. Recommend followup by abdomen and pelvis CTA in 6 months, and vascular surgery referral/consultation if not already obtained. This recommendation follows ACR consensus guidelines: White Paper of the ACR Incidental Findings Committee II on Vascular Findings. J Am Coll Radiol 2013; 10:789-794.   Electronically Signed   By: Julian Hy M.D.   On: 05/21/2015 17:06    Review of Systems  Constitutional: Negative for fever, chills, weight loss, malaise/fatigue and diaphoresis.  HENT: Negative for hearing loss.   Eyes: Negative for blurred vision, double vision, photophobia, pain, discharge and redness.  Respiratory: Negative for cough, hemoptysis, sputum production and wheezing.   Cardiovascular: Negative for chest pain, palpitations, orthopnea, claudication, leg swelling and PND.  Gastrointestinal: Positive for nausea, abdominal pain and constipation. Negative for heartburn, vomiting, diarrhea, blood in stool and melena.  Genitourinary: Negative for dysuria, urgency, frequency, hematuria and flank pain.  Neurological: Negative for dizziness, tingling, tremors, sensory change, speech change, focal  weakness, seizures, loss of consciousness, weakness and headaches.   Blood pressure 128/59, pulse 79, temperature 98.4 F (36.9 C), temperature source Oral, resp. rate 18, height _0  (1.753 m), weight 95.057 kg (209 lb 9 oz), SpO2 96 %. Physical Exam  Constitutional: He is oriented to person, place, and time. He appears well-developed and well-nourished. No distress.  HENT:  Head: Normocephalic and atraumatic.  Mouth/Throat: No oropharyngeal exudate.  Eyes: Right eye exhibits no discharge. Left eye exhibits no discharge. No scleral icterus.  Neck: Normal range of motion. Neck supple.  Cardiovascular: Normal rate, regular rhythm, normal heart sounds and intact distal pulses.  Exam reveals no gallop and no friction rub.   No murmur heard. Respiratory: Effort normal and breath sounds normal. No respiratory distress. He has no wheezes. He has no rales. He exhibits no tenderness.  GI: Soft. Bowel sounds are normal. He exhibits no distension and no mass. There is no rebound and no guarding.  TTP RUQ.  Musculoskeletal: Normal range of motion. He exhibits no tenderness.  Trace BLE edema  Neurological: He is alert and oriented to person, place, and time.  Skin: Skin is warm and dry. No rash noted. He is not diaphoretic. No erythema. No pallor.  Psychiatric: He has a normal mood and affect. His behavior is normal. Thought content normal.    Assessment/Plan: Cholecystitis and cholelithiasis-last dose of Plavix was AM 8/16.  Cardiology has been consulted for surgical clearance.  Will await recommendations.    Likely laparoscopic cholecystectomy(possibly Saturday, but may be Monday based on OR schedule).  An alternative would be a percutaneous cholecystostomy tube.   Biliary obstruction-s/p stent.  Will need an ERCP with stent removal per GI.  LFTs are normal.   CV-dCHF, HTN, CAD s/p CABG, PCI 8/15.  4.9cm AAA.  Await cards recs re surgery DM II VTE prophylaxis-SCD/heparin ID-change Unasyn to  Zosyn given severity.   FEN-continue NPO, IVF and  pain control    RIEBOCK, [place] ANP-BC 05/22/2015, 10:18 AM    Agree with above. Last Plavix was Monday, 8/15.  So tomorrow would be four days off Plavix. Has stent in place with no evidence of CBD obstruction.   Possible cholecystectomy tomorrow.   As for cardiology eval, there is a note on 01/08/2015 from Dr. Ellyn Hack stating:  "Would be okay as of May 2016 to temporarily suspend Plavix for 5 days preoperatively.  Would be okay to proceed to the OR without further ischemic evaluation. Would be considered a low risk cardiac patient (with treated/revascularized CAD with no active symptoms), and no evidence of heart failure, significant chronic kidney disease with creatinine greater than 2, no diabetes on insulin, no history of cerebrovascular disease. Intraperitoneal surgery would be considered a high risk surgery."  I spoke to Dr. Donetta Potts about the card consult around 5:30 PM.     He is in the room by himself.   He is married.  Has two children - son in 60's and daughter in 45's. I discussed with the patient the indications and risks of gall bladder surgery.  The primary risks of gall bladder surgery include, but are not limited to, bleeding, infection, common bile duct injury, and open surgery.  There is also the risk that the patient may have continued symptoms after surgery.    We discussed the typical post-operative recovery course. I tried to answer the patient's questions.  Alphonsa Overall, MD, [facility] Pager: [phone] Office phone:  [phone]

## 2015-05-22 NOTE — Progress Notes (Signed)
[facility] PROGRESS NOTE  [name] QMV:[id number] DOB: 07-20-1936 DOA: 05/21/2015 PCP: [name], MD  Assessment/Plan: 1. Biliary obstruction 1. Presented from GI clinic with worsening abd pain with prior stenting x 2. Abd [place] with evidence of obstruction with 11mm CBD. CT abd with evidence of GB wall thickening and possible stone obstruction 2. Holding aspirin and plavix (last taken 8/16). Have consulted Cardiology for cardiac clearance 3. Plans for ERCP for stent removal 4. Per Surgery, possible lap chole vs perc cholecystostomy tube 2. DM2 1. Will continue on SSI coverage for now 2. Recent a1c from 02/06/15 noted to be 6.2% 3. Glucose remains stable 3. CAD s/p CABG 1. Remains chest pain free 2. Per above, continuing to hold aspirin and plavix 3. Consulted Cardiology for cardiac clearance 4. HTN 1. BP stable 2. Will cont home meds 5. HLD 1. On zetia prior to admit 6. Chronic diastolic CHF 1. Most recent echo in 2013 with normal LVEF 2. Clinically euvolemic 7. Daily ETOH use 1. Reports drinking 4-5 beers daily, last use was on 8/16 2. Will continue on CIWA 3. Stable thus far 8. AAA 1. Abd US demonstrated a 4.9cm lower AAA with recs for follow up CTA abd/pelvis in 6 months 2. Discussed case with on-call Vascular Surgeon, Dr Kellie Simmering who recommended CT abd/pelvis to r/o AAA leak. If neg, recommendation to follow as outpatient with Vascular Surgery in 6 months 3. CT re-demonstrates 4.9cm AAA without mention of leak 9. DVT prophylaxis 1. Heparin subq  Code Status: Full Family Communication: Pt in room (indicate person spoken with, relationship, and if by phone, the number) Disposition Plan: Pending   Consultants:  GI  General Surgery  Cardiology  Discussed case with Vascular Surgery over phone  Procedures:    Antibiotics:  Unasyn 8/17>>> (indicate start date, and stop date if known)  HPI/Subjective: Still complains of abd pain. Denies  chest pain or sob  Objective: Filed Vitals:   05/21/15 2043 05/22/15 0000 05/22/15 0523 05/22/15 1325  BP: 146/67 141/60 128/59 143/61  Pulse: 73 77 79 76  Temp: 98.7 F (37.1 C) 99.7 F (37.6 C) 98.4 F (36.9 C) 98.7 F (37.1 C)  TempSrc: Oral Oral Oral Oral  Resp: 24 20 18 20   Height:      Weight:      SpO2: 99%  96% 98%   No intake or output data in the 24 hours ending 05/22/15 1723 Filed Weights   05/21/15 1600  Weight: 95.057 kg (209 lb 9 oz)    Exam:   General:  Awake, in nad  Cardiovascular: regular, s1, s2  Respiratory: normal resp effort, no wheezing  Abdomen: soft,obese, tender  Musculoskeletal: perfused, no clubbing   Data Reviewed: Basic Metabolic Panel:  Recent Labs Lab 05/21/15 1905 05/22/15 0502  NA 136 135  K 4.1 3.7  CL 106 106  CO2 24 21*  GLUCOSE 204* 120*  BUN 20 18  CREATININE 1.30*  1.30* 1.27*  CALCIUM 8.9 8.6*   Liver Function Tests:  Recent Labs Lab 05/21/15 1018 05/21/15 1905 05/22/15 0502  AST 60* 57* 35  ALT 32 42 32  ALKPHOS 32* 32* 28*  BILITOT 0.8 0.4 1.1  PROT 6.5 7.0 6.0*  ALBUMIN 3.9 3.7 3.4*    Recent Labs Lab 05/21/15 1018  LIPASE 31.0   No results for input(s): AMMONIA in the last 168 hours. CBC:  Recent Labs Lab 05/21/15 1018 05/21/15 1905 05/22/15 0502  WBC 9.0 13.7* 15.1*  NEUTROABS 7.2  --   --  HGB 11.3* 10.8* 10.4*  HCT 34.0* 33.6* 32.6*  MCV 94.8 95.5 95.0  PLT 185.0 171 166   Cardiac Enzymes: No results for input(s): CKTOTAL, CKMB, CKMBINDEX, TROPONINI in the last 168 hours. BNP (last 3 results) No results for input(s): BNP in the last 8760 hours.  ProBNP (last 3 results)  Recent Labs  06/27/14 1954 07/04/14 1300  PROBNP 333.0 397.2    CBG:  Recent Labs Lab 05/21/15 2041 05/22/15 0805 05/22/15 1127 05/22/15 1659  GLUCAP 157* 104* 98 102*    Recent Results (from the past 240 hour(s))  Culture, blood (routine x 2)     Status: None (Preliminary result)    Collection Time: 05/21/15  7:05 PM  Result Value Ref Range Status   Specimen Description BLOOD LEFT ARM  Final   Special Requests BOTTLES DRAWN AEROBIC AND ANAEROBIC 5CC  Final   Culture   Final    NO GROWTH < 24 HOURS Performed at [facility]    Report Status PENDING  Incomplete  Culture, blood (routine x 2)     Status: None (Preliminary result)   Collection Time: 05/21/15  7:14 PM  Result Value Ref Range Status   Specimen Description BLOOD LEFT ARM  Final   Special Requests BOTTLES DRAWN AEROBIC AND ANAEROBIC 10CC  Final   Culture   Final    NO GROWTH < 24 HOURS Performed at [facility]    Report Status PENDING  Incomplete     Studies: Ct Abdomen Pelvis Wo Contrast  05/21/2015   CLINICAL DATA:  Abdominal pain. Nausea. Bile duct obstruction with stenting. Cholelithiasis. Abdominal aortic aneurysm.  EXAM: CT ABDOMEN AND PELVIS WITHOUT CONTRAST  TECHNIQUE: Multidetector CT imaging of the abdomen and pelvis was performed following the standard protocol without IV contrast.  COMPARISON:  ERCP dated 07/05/2014 and MRI of the abdomen dated 06/30/2014  FINDINGS: Lower chest: Minimal scarring at the lung bases. Large hiatal hernia. Extensive coronary artery calcification. Extensive calcification in the mitral valve annulus. Heart size is normal.  Hepatobiliary: There is a 5 mm stone in the neck of the gallbladder. There is edema of the wall of the distended gallbladder. No biliary ductal dilatation. Biliary stent in place which appears in good position. Small amount of air in the biliary tree of the left lobe of the liver. Liver parenchyma is otherwise normal.  Pancreas: Normal.  Spleen: Normal.  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 Right adrenal gland is normal. Chronic right renal atrophy. Left kidney is normal. Bladder is normal.  Stomach/Bowel: Large hiatal hernia. Multiple diverticula in the proximal sigmoid portion of the colon. Terminal ileum  and appendix are normal.  Vascular/Lymphatic: 4.9 cm saccular abdominal aortic aneurysm. Extensive aortic atherosclerosis. No adenopathy.  Musculoskeletal: No acute abnormality. 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 Previous lumbar fusions.  IMPRESSION: 1. Diffuse edema of the wall of the distended gallbladder with a stone lodged in the neck of the gallbladder. This could represent acute and possibly gangrenous cholecystitis. 2. Biliary stent appears in good position. 3. 4.9 cm saccular abdominal aortic aneurysm. 4. Large hiatal hernia.   Electronically Signed   By: Lorriane Shire M.D.   On: 05/21/2015 21:26   US Abdomen Complete  05/21/2015   CLINICAL DATA:  Acute abdominal pain, biliary stent  EXAM: ULTRASOUND ABDOMEN COMPLETE  COMPARISON:  MRI abdomen dated 06/30/2014  FINDINGS: Gallbladder: Gallbladder sludge. Pericholecystic fluid. Negative sonographic Murphy's sign.  Common bile duct: Diameter:  12 mm.  Liver: No focal lesion identified. Within normal limits in parenchymal echogenicity. Intrahepatic ductal dilatation.  IVC: No abnormality visualized.  Pancreas: Not discretely visualized.  Spleen: Size and appearance within normal limits.  Right Kidney: Length: 9.0 cm.  No mass or hydronephrosis.  Left Kidney: Length: 12.0 cm.  No mass or hydronephrosis.  Abdominal aorta: 4.9 cm lower abdominal aortic aneurysm.  Other findings: None.  IMPRESSION: Mild intrahepatic/extrahepatic ductal dilatation. Common duct measures 12 mm.  Layering gallbladder sludge. No sonographic findings to suggest acute cholecystitis.  4.9 cm lower abdominal aortic aneurysm. Recommend followup by abdomen and pelvis CTA in 6 months, and vascular surgery referral/consultation if not already obtained. This recommendation follows ACR consensus guidelines: White Paper of the ACR Incidental Findings Committee II on Vascular Findings. J Am Coll Radiol 2013; 10:789-794.   Electronically Signed   By: Julian Hy M.D.   On: 05/21/2015 17:06    Scheduled Meds: . amLODipine  5 mg Oral Daily  . ezetimibe  10 mg Oral Daily  . ferrous sulfate  325 mg Oral Q breakfast  . folic acid  1 mg Oral Daily  . heparin  5,000 Units Subcutaneous 3 times per day  . insulin aspart  0-15 Units Subcutaneous TID WC  . insulin aspart  0-5 Units Subcutaneous QHS  . LORazepam  0-4 mg Oral Q6H   Followed by  . [START ON 05/23/2015] LORazepam  0-4 mg Oral Q12H  . losartan  50 mg Oral Daily  . metoprolol succinate  25 mg Oral Daily  . multivitamin with minerals  1 tablet Oral Daily  . pantoprazole  40 mg Oral Daily  . piperacillin-tazobactam (ZOSYN)  IV  3.375 g Intravenous 3 times per day  . sodium chloride  3 mL Intravenous Q12H  . thiamine  100 mg Oral Daily   Or  . thiamine  100 mg Intravenous Daily   Continuous Infusions: . sodium chloride 0.45 % 1,000 mL with potassium chloride 20 mEq infusion 75 mL/hr at 05/22/15 1446    Principal Problem:   Biliary obstruction Active Problems:   Type II diabetes mellitus with complication -- CAD, AAA   Hypertension associated with diabetes   Hyperlipidemia LDL goal <70; statin intolerant   Chronic diastolic CHF (congestive heart failure)   S/P CABG x 2: 1997. SVG-OM (known to be occluded), LIMA-LAD   Cholelithiasis with cholecystitis    Bekki Tavenner, [facility] Pager [phone]. If 7PM-7AM, please contact night-coverage at [email or web address], password [facility] 05/22/2015, 5:23 PM  LOS: 1 day

## 2015-05-23 ENCOUNTER — Inpatient Hospital Stay (HOSPITAL_COMMUNITY): Payer: Medicare Other

## 2015-05-23 ENCOUNTER — Encounter (HOSPITAL_COMMUNITY)
Admission: AD | Disposition: A | Discharge status: Home / Self Care | Payer: Self-pay | Source: Ambulatory Visit | Attending: Internal Medicine

## 2015-05-23 ENCOUNTER — Inpatient Hospital Stay (HOSPITAL_COMMUNITY): Payer: Medicare Other | Admitting: Anesthesiology

## 2015-05-23 ENCOUNTER — Encounter (HOSPITAL_COMMUNITY): Payer: Self-pay

## 2015-05-23 DIAGNOSIS — Z01810 Encounter for preprocedural cardiovascular examination: Secondary | ICD-10-CM

## 2015-05-23 DIAGNOSIS — I714 Abdominal aortic aneurysm, without rupture: Secondary | ICD-10-CM

## 2015-05-23 DIAGNOSIS — K831 Obstruction of bile duct: Secondary | ICD-10-CM

## 2015-05-23 DIAGNOSIS — Z951 Presence of aortocoronary bypass graft: Secondary | ICD-10-CM

## 2015-05-23 HISTORY — PX: CHOLECYSTECTOMY: SHX55

## 2015-05-23 LAB — CBC
HEMATOCRIT: 32.2 % — AB (ref 39.0–52.0)
HEMOGLOBIN: 10.8 g/dL — AB (ref 13.0–17.0)
MCH: 31.9 pg (ref 26.0–34.0)
MCHC: 33.5 g/dL (ref 30.0–36.0)
MCV: 95 fL (ref 78.0–100.0)
Platelets: 154 10*3/uL (ref 150–400)
RBC: 3.39 MIL/uL — ABNORMAL LOW (ref 4.22–5.81)
RDW: 14.7 % (ref 11.5–15.5)
WBC: 15.6 10*3/uL — ABNORMAL HIGH (ref 4.0–10.5)

## 2015-05-23 LAB — GLUCOSE, CAPILLARY
GLUCOSE-CAPILLARY: 88 mg/dL (ref 65–99)
GLUCOSE-CAPILLARY: 81 mg/dL (ref 65–99)
GLUCOSE-CAPILLARY: 97 mg/dL (ref 65–99)
Glucose-Capillary: 95 mg/dL (ref 65–99)
Glucose-Capillary: 119 mg/dL — ABNORMAL HIGH (ref 65–99)

## 2015-05-23 LAB — TROPONIN I: Troponin I: 0.03 ng/mL (ref <0.031)

## 2015-05-23 LAB — COMPREHENSIVE METABOLIC PANEL
ALBUMIN: 3.2 g/dL — AB (ref 3.5–5.0)
ALK PHOS: 28 U/L — AB (ref 38–126)
ALT: 25 U/L (ref 17–63)
ANION GAP: 7 (ref 5–15)
AST: 22 U/L (ref 15–41)
BUN: 19 mg/dL (ref 6–20)
CALCIUM: 8.7 mg/dL — AB (ref 8.9–10.3)
CHLORIDE: 105 mmol/L (ref 101–111)
CO2: 22 mmol/L (ref 22–32)
Creatinine, Ser: 1.56 mg/dL — ABNORMAL HIGH (ref 0.61–1.24)
GFR calc Af Amer: 48 mL/min — ABNORMAL LOW (ref >60)
GFR calc non Af Amer: 41 mL/min — ABNORMAL LOW (ref >60)
GLUCOSE: 97 mg/dL (ref 65–99)
POTASSIUM: 3.7 mmol/L (ref 3.5–5.1)
SODIUM: 134 mmol/L — AB (ref 135–145)
Total Bilirubin: 1.7 mg/dL — ABNORMAL HIGH (ref 0.3–1.2)
Total Protein: 6.1 g/dL — ABNORMAL LOW (ref 6.5–8.1)

## 2015-05-23 LAB — CK TOTAL AND CKMB (NOT AT ARMC)
CK, MB: 15.5 ng/mL — AB (ref 0.5–5.0)
RELATIVE INDEX: INVALID (ref 0.0–2.5)
Total CK: 36 U/L — ABNORMAL LOW (ref 49–397)

## 2015-05-23 LAB — SURGICAL PCR SCREEN
MRSA, PCR: NEGATIVE
Staphylococcus aureus: POSITIVE — AB

## 2015-05-23 SURGERY — LAPAROSCOPIC CHOLECYSTECTOMY WITH INTRAOPERATIVE CHOLANGIOGRAM
Anesthesia: General

## 2015-05-23 MED ORDER — LIDOCAINE HCL (PF) 2 % IJ SOLN
INTRAMUSCULAR | Status: DC | PRN
  Administered 2015-05-23: 50 mg via INTRADERMAL

## 2015-05-23 MED ORDER — GLYCOPYRROLATE 0.2 MG/ML IJ SOLN
INTRAMUSCULAR | Status: AC
  Filled 2015-05-23: qty 3

## 2015-05-23 MED ORDER — PROPOFOL 10 MG/ML IV BOLUS
INTRAVENOUS | Status: DC | PRN
  Administered 2015-05-23: 30 mg via INTRAVENOUS

## 2015-05-23 MED ORDER — ONDANSETRON HCL 4 MG/2ML IJ SOLN: 4.0000 mg | Freq: Once | INTRAMUSCULAR | Status: DC | PRN

## 2015-05-23 MED ORDER — BUPIVACAINE HCL (PF) 0.25 % IJ SOLN
INTRAMUSCULAR | Status: DC | PRN
Start: 2015-05-23 — End: 2015-05-23
  Administered 2015-05-23: 30 mL

## 2015-05-23 MED ORDER — LACTATED RINGERS IR SOLN
Status: DC | PRN
  Administered 2015-05-23: 3000 mL
  Administered 2015-05-23: 1

## 2015-05-23 MED ORDER — DEXAMETHASONE SODIUM PHOSPHATE 10 MG/ML IJ SOLN
INTRAMUSCULAR | Status: AC
  Filled 2015-05-23: qty 1

## 2015-05-23 MED ORDER — FENTANYL CITRATE (PF) 100 MCG/2ML IJ SOLN
25.0000 ug | INTRAMUSCULAR | Status: DC | PRN
Start: 2015-05-23 — End: 2015-05-23

## 2015-05-23 MED ORDER — MIDAZOLAM HCL 2 MG/2ML IJ SOLN
INTRAMUSCULAR | Status: AC
  Filled 2015-05-23: qty 2

## 2015-05-23 MED ORDER — MORPHINE SULFATE (PF) 2 MG/ML IV SOLN
1.0000 mg | INTRAVENOUS | Status: DC | PRN
  Filled 2015-05-23: qty 1

## 2015-05-23 MED ORDER — PHENYLEPHRINE HCL 10 MG/ML IJ SOLN
INTRAMUSCULAR | Status: DC | PRN
  Administered 2015-05-23 (×3): 80 ug via INTRAVENOUS

## 2015-05-23 MED ORDER — FENTANYL CITRATE (PF) 100 MCG/2ML IJ SOLN
INTRAMUSCULAR | Status: DC | PRN
  Administered 2015-05-23 (×2): 50 ug via INTRAVENOUS
  Administered 2015-05-23: 100 ug via INTRAVENOUS
  Administered 2015-05-23: 50 ug via INTRAVENOUS

## 2015-05-23 MED ORDER — GLYCOPYRROLATE 0.2 MG/ML IJ SOLN
INTRAMUSCULAR | Status: DC | PRN
  Administered 2015-05-23: 0.6 mg via INTRAVENOUS

## 2015-05-23 MED ORDER — ROCURONIUM BROMIDE 100 MG/10ML IV SOLN
INTRAVENOUS | Status: DC | PRN
  Administered 2015-05-23: 10 mg via INTRAVENOUS
  Administered 2015-05-23: 40 mg via INTRAVENOUS
  Administered 2015-05-23: 10 mg via INTRAVENOUS

## 2015-05-23 MED ORDER — LACTATED RINGERS IV SOLN
INTRAVENOUS | Status: AC
  Administered 2015-05-23: 21:00:00 via INTRAVENOUS
  Administered 2015-05-23: 1000 mL via INTRAVENOUS
  Administered 2015-05-23: 15:00:00 via INTRAVENOUS

## 2015-05-23 MED ORDER — ESMOLOL HCL 10 MG/ML IV SOLN
INTRAVENOUS | Status: AC
  Filled 2015-05-23: qty 10

## 2015-05-23 MED ORDER — HEPARIN SODIUM (PORCINE) 5000 UNIT/ML IJ SOLN
5000.0000 [IU] | Freq: Three times a day (TID) | INTRAMUSCULAR | Status: DC
  Administered 2015-05-23 – 2015-05-26 (×8): 5000 [IU] via SUBCUTANEOUS
  Filled 2015-05-23 (×9): qty 1

## 2015-05-23 MED ORDER — PHENYLEPHRINE 40 MCG/ML (10ML) SYRINGE FOR IV PUSH (FOR BLOOD PRESSURE SUPPORT)
PREFILLED_SYRINGE | INTRAVENOUS | Status: AC
  Filled 2015-05-23: qty 10

## 2015-05-23 MED ORDER — NEOSTIGMINE METHYLSULFATE 10 MG/10ML IV SOLN
INTRAVENOUS | Status: DC | PRN
  Administered 2015-05-23: 4 mg via INTRAVENOUS

## 2015-05-23 MED ORDER — PROPOFOL 10 MG/ML IV BOLUS
INTRAVENOUS | Status: AC
Start: 2015-05-23 — End: 2015-05-23
  Filled 2015-05-23: qty 20

## 2015-05-23 MED ORDER — ONDANSETRON HCL 4 MG/2ML IJ SOLN
INTRAMUSCULAR | Status: AC
  Filled 2015-05-23: qty 2

## 2015-05-23 MED ORDER — DEXAMETHASONE SODIUM PHOSPHATE 10 MG/ML IJ SOLN
INTRAMUSCULAR | Status: DC | PRN
  Administered 2015-05-23: 10 mg via INTRAVENOUS

## 2015-05-23 MED ORDER — LIDOCAINE HCL (CARDIAC) 20 MG/ML IV SOLN
INTRAVENOUS | Status: AC
  Filled 2015-05-23: qty 5

## 2015-05-23 MED ORDER — PHENYLEPHRINE HCL 10 MG/ML IJ SOLN
10.0000 mg | INTRAMUSCULAR | Status: DC | PRN
  Administered 2015-05-23: 50 ug/min via INTRAVENOUS

## 2015-05-23 MED ORDER — ETOMIDATE 2 MG/ML IV SOLN
INTRAVENOUS | Status: DC | PRN
  Administered 2015-05-23: 12 mg via INTRAVENOUS

## 2015-05-23 MED ORDER — IOHEXOL 300 MG/ML  SOLN
INTRAMUSCULAR | Status: DC | PRN
  Administered 2015-05-23: 8 mL via ORAL

## 2015-05-23 MED ORDER — FENTANYL CITRATE (PF) 250 MCG/5ML IJ SOLN
INTRAMUSCULAR | Status: AC
  Filled 2015-05-23: qty 5

## 2015-05-23 MED ORDER — ESMOLOL HCL 10 MG/ML IV SOLN
INTRAVENOUS | Status: DC | PRN
  Administered 2015-05-23 (×2): 10 mg via INTRAVENOUS

## 2015-05-23 MED ORDER — MORPHINE SULFATE (PF) 2 MG/ML IV SOLN
1.0000 mg | INTRAVENOUS | Status: DC | PRN
  Filled 2015-05-23: qty 2

## 2015-05-23 MED ORDER — SUCCINYLCHOLINE CHLORIDE 20 MG/ML IJ SOLN
INTRAMUSCULAR | Status: DC | PRN
  Administered 2015-05-23: 100 mg via INTRAVENOUS

## 2015-05-23 MED ORDER — PHENYLEPHRINE HCL 10 MG/ML IJ SOLN
INTRAMUSCULAR | Status: AC
  Filled 2015-05-23: qty 1

## 2015-05-23 MED ORDER — ETOMIDATE 2 MG/ML IV SOLN
INTRAVENOUS | Status: AC
  Filled 2015-05-23: qty 10

## 2015-05-23 SURGICAL SUPPLY — 36 items
APL SKNCLS STERI-STRIP NONHPOA (GAUZE/BANDAGES/DRESSINGS) ×1
APPLIER CLIP ROT 10 11.4 M/L (STAPLE) ×3
APR CLP MED LRG 11.4X10 (STAPLE) ×1
BAG SPEC RTRVL LRG 6X4 10 (ENDOMECHANICALS)
BENZOIN TINCTURE PRP APPL 2/3 (GAUZE/BANDAGES/DRESSINGS) ×3 IMPLANT
CHLORAPREP W/TINT 26ML (MISCELLANEOUS) ×3 IMPLANT
CHOLANGIOGRAM CATH TAUT (CATHETERS) ×3 IMPLANT
CLIP APPLIE ROT 10 11.4 M/L (STAPLE) ×1 IMPLANT
CLOSURE WOUND 1/4X4 (GAUZE/BANDAGES/DRESSINGS) ×1
COVER MAYO STAND STRL (DRAPES) IMPLANT
COVER SURGICAL LIGHT HANDLE (MISCELLANEOUS) ×3 IMPLANT
DECANTER SPIKE VIAL GLASS SM (MISCELLANEOUS) ×3 IMPLANT
DRAIN CHANNEL 19F RND (DRAIN) ×2 IMPLANT
DRAPE C-ARM 42X120 X-RAY (DRAPES) IMPLANT
DRAPE LAPAROSCOPIC ABDOMINAL (DRAPES) ×3 IMPLANT
ELECT REM PT RETURN 9FT ADLT (ELECTROSURGICAL) ×3
ELECTRODE REM PT RTRN 9FT ADLT (ELECTROSURGICAL) ×1 IMPLANT
GLOVE SURG SIGNA 7.5 PF LTX (GLOVE) ×3 IMPLANT
GOWN STRL REUS W/TWL XL LVL3 (GOWN DISPOSABLE) ×9 IMPLANT
HEMOSTAT SURGICEL 4X8 (HEMOSTASIS) IMPLANT
IV CATH AUTO 14GX1.75 SAFE ORG (IV SOLUTION) ×3 IMPLANT
IV SET EXTENSION CATH 6 NF (IV SETS) ×3 IMPLANT
KIT BASIN OR (CUSTOM PROCEDURE TRAY) ×3 IMPLANT
LIQUID BAND (GAUZE/BANDAGES/DRESSINGS) IMPLANT
NS IRRIG 1000ML POUR BTL (IV SOLUTION) ×2 IMPLANT
POUCH SPECIMEN RETRIEVAL 10MM (ENDOMECHANICALS) IMPLANT
SET IRRIG TUBING LAPAROSCOPIC (IRRIGATION / IRRIGATOR) ×3 IMPLANT
SLEEVE XCEL OPT CAN 5 100 (ENDOMECHANICALS) ×3 IMPLANT
STOPCOCK 4 WAY LG BORE MALE ST (IV SETS) ×3 IMPLANT
STRIP CLOSURE SKIN 1/4X4 (GAUZE/BANDAGES/DRESSINGS) ×2 IMPLANT
SUT VIC AB 5-0 PS2 18 (SUTURE) ×3 IMPLANT
TOWEL OR 17X26 10 PK STRL BLUE (TOWEL DISPOSABLE) ×3 IMPLANT
TRAY LAPAROSCOPIC (CUSTOM PROCEDURE TRAY) ×3 IMPLANT
TROCAR BLADELESS OPT 5 100 (ENDOMECHANICALS) ×3 IMPLANT
TROCAR XCEL BLUNT TIP 100MML (ENDOMECHANICALS) ×3 IMPLANT
TROCAR XCEL NON-BLD 11X100MML (ENDOMECHANICALS) IMPLANT

## 2015-05-23 NOTE — Progress Notes (Signed)
     [facility] Progress Note  Subjective:  Still with RUQ pain but not as severe as yesterday. No nausea or vomitng. Passing gas. For OR later today.     Objective:  Vital signs in last 24 hours: Temp:  [98.3 F (36.8 C)-99.6 F (37.6 C)] 98.8 F (37.1 C) (08/19 0545) Pulse Rate:  [76-92] 85 (08/19 0545) Resp:  [18-20] 18 (08/19 0545) BP: (137-143)/(51-67) 137/51 mmHg (08/19 0545) SpO2:  [95 %-98 %] 95 % (08/19 0545) Weight:  [207 lb 0.2 oz (93.9 kg)] 207 lb 0.2 oz (93.9 kg) (08/19 0545) Last BM Date: 05/20/15 General:   Alert,  Well-developed, in NAD Heart:  Regular rate and rhythm; no murmurs Pulm;lungs clear Abdomen:  Soft, tender RUQ, nondistended. Normal bowel sounds, without guarding, and without rebound.   Extremities:  trace LE edema NeurologicAlert and  oriented x4;  grossly normal neurologically. Psych: Alert and cooperative. Normal mood and affect.  Intake/Output from previous day: 08/18 0701 - 08/19 0700 In: 1898.8 [I.V.:1798.8; IV Piggyback:100] Out: 700 [Urine:700] Intake/Output this shift:    Lab Results:  Recent Labs  05/21/15 1905 05/22/15 0502 05/23/15 0450  WBC 13.7* 15.1* 15.6*  HGB 10.8* 10.4* 10.8*  HCT 33.6* 32.6* 32.2*  PLT 171 166 154   BMET  Recent Labs  05/21/15 1905 05/22/15 0502 05/23/15 0450  NA 136 135 134*  K 4.1 3.7 3.7  CL 106 106 105  CO2 24 21* 22  GLUCOSE 204* 120* 97  BUN 20 18 19   CREATININE 1.30*  1.30* 1.27* 1.56*  CALCIUM 8.9 8.6* 8.7*   LFT  Recent Labs  05/21/15 1018  05/23/15 0450  PROT 6.5  < > 6.1*  ALBUMIN 3.9  < > 3.2*  AST 60*  < > 22  ALT 32  < > 25  ALKPHOS 32*  < > 28*  BILITOT 0.8  < > 1.7*  BILIDIR 0.4*  --   --   < > = values in this interval not displayed.    ASSESSMENT/PLAN:   Biliary obstruction due to choledocholithiasis, cholelithiasis: Not a surgical candidate previously due to Plavix requirement from cardiac stent placement in 05/2014 (one year commitment).   --9/30 ERCP #1 with metal stent placement. Developed pancreatitis, cholangitis, stone occlusion of stent post-op  --10/2 ERCP # 2: Stent replaced, bile duct swept of stones and pus. Now with development of sudden onset abdominal pain, similar to previous.Last dose of plavix was 8/15. For OR today for cholecystectomy with IOC. Will need ERCP with stent removal at a later date.     LOS: 2 days   Keona Sheffler, Vita Barley PA-C 05/23/2015, Pager [phone]

## 2015-05-23 NOTE — Anesthesia Procedure Notes (Signed)
Procedure Name: Intubation Date/Time: 05/23/2015 1:23 PM Performed by: Dimas Millin, Krystl Wickware F Pre-anesthesia Checklist: Patient identified, Emergency Drugs available, Suction available, Patient being monitored and Timeout performed Patient Re-evaluated:Patient Re-evaluated prior to inductionOxygen Delivery Method: Circle system utilized Preoxygenation: Pre-oxygenation with 100% oxygen Intubation Type: IV induction Ventilation: Mask ventilation without difficulty Laryngoscope Size: Miller and 2 Grade View: Grade I Number of attempts: 1 Airway Equipment and Method: Stylet Placement Confirmation: ETT inserted through vocal cords under direct vision,  positive ETCO2 and breath sounds checked- equal and bilateral Secured at: 23 cm Tube secured with: Tape Dental Injury: Teeth and Oropharynx as per pre-operative assessment

## 2015-05-23 NOTE — Consult Note (Signed)
Cardiologist:  Ellyn Hack  Reason for Consult: preop clearance Referring Physician:   [name] is an 79 y.o. male.  HPI:  The patient is a 79yo male with a history of CAD, CABG x2-1997(LIMA to LAD, SVG to OM), PVCs, PACs, OSA, HTN, dyslipidemia, AAA.  His last cardiac cath was 06/28/14. The sites of PCI done in May 17, 2014, including the proximal RCA and obtuse marginal branch, were widely patent without restenosis.  Both stents were drug eluding. There was no change in the previous diffusely diseased AV groove circumflex, which is a small-caliber vessel with a prominent region of ectasia.  LV function ~45%.    He presented from the GI clinic with worsening Abd pain.  Dr. Lucia Gaskins is planning cholecystectomy today at 1:30.  Since the patient has seen Dr. Ellyn Hack back in April he's had no complaints of angina with his usual activities which include going fishing and mowing his yard.  He's had some lower extremity edema which resolves by morning.  He's also had some nausea associated with his abdominal pain.  He otherwise denies  vomiting, fever, orthopnea, dizziness, PND, cough, congestion, hematochezia, melena, claudication.     Past Medical History  Diagnosis Date  . History of: ST elevation myocardial infarction (STEMI) involving left circumflex coronary artery with complication 0071    PTCA-circumflex; PCI in 1991  . CAD, multiple vessel 1985-2010    Most recent cath 01/01/09: 100% Occluded LAD after SP1, patent LIMA-distal LAD with apical 90% lesion.  Cx-OM1 widely patent stent into proximal bifurcating OM1 . Follow-on Cx => 70-80% -- non-amenable PCI. Widely patent 3 overlapping stents in midRCA with only 40% RPL stenosis; SVG- OM known to be occluded.  . S/P CABG x 2 1997    LIMA-LAD, SVG-OM  . H/O unstable angina 06/2003, 12/2006, 05/2014    a) '04: Staged Taxus DES PCI to RCA and Cx-OM;;'08 - PCI to proximal ISR in RCA - Cypher DES; c) 05/2014: ISR in RCA & OM1, PCI with Promus P  DES: RCA  2.75 x 12, OM1 3.0 mm x 8)  . History of nuclear stress test December 2013    LOW at Risk. Moderate region of mid to basal inferolateral scar without ischemia -- consistent with distal circumflex disease. Mild apical hypokinesis with an EF of 47%.  . Irregular heart beat     PVCs and PACs, no arrhythmia recorded  . Exertional dyspnea     Chronic baseline SOB with ambulation  . Sleep apnea     pt. states he was told to return for f/u, to be fitted for CPAp, but pt. reports that he didn't follow up  . AAA (abdominal aortic aneurysm) 08/30/12    Doppler 08/30/12 had a small amount of growth. It was a 4.2 x 4.3 cm greater in size than the previous one noted at 4 x 3.8.  . Diabetes mellitus     Not on oral medication  . Hypertension   . Dyslipidemia, goal LDL below 70   . Osteoarthritis of both knees     And back; multiple back surgeries, right knee arthroplasty and left knee arthroscopic surgery x2  . Chronic back pain      multiple surgeries; C-spine and lumbar  . Erectile dysfunction   . H/O: pneumonia February 14  . Chronic anemia     On iron supplement; history of positive guaiac - negative colonoscopy in 1996.; Thought to be related to hemorrhoids; status post hemorrhoidectomy  . Ankle edema  Chronic  . Diverticulitis of colon 1996  . Choledocholithiasis   . Hemorrhoids   . Diverticulosis   . Adenomatous colon polyp   . Hiatal hernia   . Cholelithiasis - with cholangitis     status post ERCP with removal of calculi and biliary stent placement.    Past Surgical History  Procedure Laterality Date  . Coronary angioplasty  [id number]    1985 lateral STEMI Circumflex PTCA  . Anterior lat lumbar fusion Left 11/22/2012    Procedure: ANTERIOR LATERAL LUMBAR FUSION 1 LEVEL;  Surgeon: Eustace Moore, MD;  Location: [place] [facility];  Service: Neurosurgery;  Laterality: Left;  Anterior Lateral Lumbar Fusion Lumbar Three-Four  . Lumbar percutaneous pedicle screw 1 level N/A  11/22/2012    Procedure: LUMBAR PERCUTANEOUS PEDICLE SCREW 1 LEVEL;  Surgeon: Eustace Moore, MD;  Location: [place] [facility];  Service: Neurosurgery;  Laterality: N/A;  Lumbar Three-Four Percutaneous Pedicle Screw, Lateral approach  . Back surgery  1979 & x 10    pt. remarks, "I have had about 10 back surgeries"  . Posterior cervical fusion/foraminotomy N/A 05/02/2013    Procedure: POSTERIOR CERVICAL FUSION/FORAMINOTOMY CERVICAL SEVEN THORACIC-ONE;  Surgeon: Eustace Moore, MD;  Location: [place] [facility];  Service: Neurosurgery;  Laterality: N/A;  POSTERIOR CERVICAL FUSION/FORAMINOTOMY CERVICAL SEVEN THORACIC-ONE  . Coronary artery bypass graft  1990    LIMA-LAD, SVG-OM  . Cardiac catheterization  September 2004    None Occluded vein graft to OM; diffuse RCA disease in the mid vessel, 80% circumflex-OM stenosis; follow on AV groove circumflex with sequential 90% stenoses and intervening saccular dilation   . Percutaneous coronary stent intervention (pci-s)  September 2004    PCI - RCA 2 overlapping Taxus DES 2.75 mm x 32 mm and 2.75 mm x 12 mm (3.0 mm); PCI-Cx-OM1 - Taxus DES 3.0 mm x 20 mm (3.1 mm);   Marland Kitchen Percutaneous coronary stent intervention (pci-s)  March 2008    80% ISR in proximal Taxus stent in RCA -- covered proximally with Cypher DES 3.0 mm x 12 mm  . Cardiac catheterization  March 2010    4 abnormal Myoview showing apical thinning (possibly due to apical LAD 95%) : 100% Occluded LAD after SV1, distal LAD grafted via LIMA -apical 95% . Cx -OM1 w/patent stent extending into OM 1 . Follow on Cx - 70-80% - non-amenable PCI. RCA widely patent 3 overlapping stents in mRCA w/less than 40% stenosisin RPL; SVG-OM known occluded   . Cervical discectomy  04/23/10    Decompressive anterior carvical diskectomy. C4-5, C6-7  . Cervical arthrodesis  04/23/10    Anterior cervical arthrodesis, C4-5, C6-7 utilizing 7-mm PEEK interbody cage packed with local autograft & Antifuse putty at C4-5 & an 8-mm cage at C6-7.    Marland Kitchen Anterior cervical plating  04/23/10    At C4-5 and a C6-7 utilizing two separate Biomet MaxAn plates.  . Colonoscopy  1996  . Minor hemorrhoidectomy    . Total knee arthroplasty Right   . Knee arthroscopy Left     x 2  . Nm myoview ltd  December 2013    LOW RISK. Mmoderate region of mid to basal inferolateral scar without ischemia. Mild apical hypokinesis with an EF of 47%.  . Transthoracic echocardiogram  December 2013    EF 55-60%. moderate LA dilation. Aortic Sclerosis  . Abdominal and lower extremity arterial ultrasound  08/23/2012; 10/10/2013    Normal ABIs. Nonocclusive lower extremity disease. 4.2 cm x 4.3 cm infrarenal  AAA;; 4.4 cm x 4.3 cm (essentially stable)   . Cataract extraction    . Ercp N/A 07/03/2014    Procedure: ENDOSCOPIC RETROGRADE CHOLANGIOPANCREATOGRAPHY (ERCP);  Surgeon: Inda Castle, MD;  Location: [place];  Service: Endoscopy;  Laterality: N/A;  . Biliary stent placement N/A 07/03/2014    Procedure: BILIARY STENT PLACEMENT;  Surgeon: Inda Castle, MD;  Location: [place];  Service: Endoscopy;  Laterality: N/A;  . Ercp N/A 07/05/2014    Procedure: ENDOSCOPIC RETROGRADE CHOLANGIOPANCREATOGRAPHY (ERCP);  Surgeon: Inda Castle, MD;  Location: [place];  Service: Endoscopy;  Laterality: N/A;  . Left heart catheterization with coronary angiogram N/A 05/15/2014    Procedure: LEFT HEART CATHETERIZATION WITH CORONARY ANGIOGRAM;  Surgeon: Sinclair Grooms, MD;  Location: [facility] CATH LAB;  Service: Cardiovascular;  Laterality: N/A;  . Percutaneous coronary stent intervention (pci-s) N/A 05/17/2014    Procedure: PERCUTANEOUS CORONARY STENT INTERVENTION (PCI-S);  Surgeon: Sinclair Grooms, MD;  Location: [facility] C[facility] LAB;  Service: Cardiovascular;  Laterality: N/A;  . Left heart catheterization with coronary/graft angiogram N/A 06/28/2014    Procedure: LEFT HEART CATHETERIZATION WITH Beatrix Fetters;  Surgeon: Troy Sine, MD;  Location: [facility] CATH LAB;   Service: Cardiovascular;  Laterality: N/A;    Family History  Problem Relation Age of Onset  . Arthritis Mother   . Diabetes Father   . Heart disease Father   . Stroke Sister   . Hypertension Sister   . Heart disease Sister   . Diabetes Sister   . Breast cancer Sister   . Heart disease Brother   . Ulcers Brother   . Colon cancer Neg Hx     Social History:  reports that he quit smoking about 31 years ago. His smoking use included Cigarettes. He has never used smokeless tobacco. He reports that he drinks about 1.8 oz of alcohol per week. He reports that he does not use illicit drugs.  Allergies:  Allergies  Allergen Reactions  . Lisinopril Cough    Rxn: unknown  . Statins Other (See Comments)    Muscle ache  . Welchol [Colesevelam Hcl] Itching    Medications:  Scheduled Meds: . amLODipine  5 mg Oral Daily  . ezetimibe  10 mg Oral Daily  . ferrous sulfate  325 mg Oral Q breakfast  . folic acid  1 mg Oral Daily  . heparin  5,000 Units Subcutaneous 3 times per day  . insulin aspart  0-15 Units Subcutaneous TID WC  . insulin aspart  0-5 Units Subcutaneous QHS  . LORazepam  0-4 mg Oral Q6H   Followed by  . LORazepam  0-4 mg Oral Q12H  . losartan  50 mg Oral Daily  . metoprolol succinate  25 mg Oral Daily  . multivitamin with minerals  1 tablet Oral Daily  . pantoprazole  40 mg Oral Daily  . piperacillin-tazobactam (ZOSYN)  IV  3.375 g Intravenous 3 times per day  . sodium chloride  3 mL Intravenous Q12H  . thiamine  100 mg Oral Daily   Or  . thiamine  100 mg Intravenous Daily   Continuous Infusions: . sodium chloride 0.45 % 1,000 mL with potassium chloride 20 mEq infusion 75 mL/hr at 05/22/15 1446   PRN Meds:.bisacodyl, HYDROmorphone (DILAUDID) injection, LORazepam **OR** LORazepam, morphine injection, ondansetron **OR** ondansetron (ZOFRAN) IV   Results for orders placed or performed during the hospital encounter of 05/21/15 (from the past 48 hour(s))  CBC  Status: Abnormal   Collection Time: 05/21/15  7:05 PM  Result Value Ref Range   WBC 13.7 (H) 4.0 - 10.5 K/uL   RBC 3.52 (L) 4.22 - 5.81 MIL/uL   Hemoglobin 10.8 (L) 13.0 - 17.0 g/dL   HCT 33.6 (L) 39.0 - 52.0 %   MCV 95.5 78.0 - 100.0 fL   MCH 30.7 26.0 - 34.0 pg   MCHC 32.1 30.0 - 36.0 g/dL   RDW 14.4 11.5 - 15.5 %   Platelets 171 150 - 400 K/uL  Creatinine, serum     Status: Abnormal   Collection Time: 05/21/15  7:05 PM  Result Value Ref Range   Creatinine, Ser 1.30 (H) 0.61 - 1.24 mg/dL   GFR calc non Af Amer 51 (L) >60 mL/min   GFR calc Af Amer 59 (L) >60 mL/min    Comment: (NOTE) The eGFR has been calculated using the CKD EPI equation. This calculation has not been validated in all clinical situations. eGFR's persistently <60 mL/min signify possible Chronic Kidney Disease.   Culture, blood (routine x 2)     Status: None (Preliminary result)   Collection Time: 05/21/15  7:05 PM  Result Value Ref Range   Specimen Description BLOOD LEFT ARM    Special Requests BOTTLES DRAWN AEROBIC AND ANAEROBIC 5CC    Culture      NO GROWTH < 24 HOURS Performed at [facility]    Report Status PENDING   Comprehensive metabolic panel     Status: Abnormal   Collection Time: 05/21/15  7:05 PM  Result Value Ref Range   Sodium 136 135 - 145 mmol/L   Potassium 4.1 3.5 - 5.1 mmol/L   Chloride 106 101 - 111 mmol/L   CO2 24 22 - 32 mmol/L   Glucose, Bld 204 (H) 65 - 99 mg/dL   BUN 20 6 - 20 mg/dL   Creatinine, Ser 1.30 (H) 0.61 - 1.24 mg/dL   Calcium 8.9 8.9 - 10.3 mg/dL   Total Protein 7.0 6.5 - 8.1 g/dL   Albumin 3.7 3.5 - 5.0 g/dL   AST 57 (H) 15 - 41 U/L   ALT 42 17 - 63 U/L   Alkaline Phosphatase 32 (L) 38 - 126 U/L   Total Bilirubin 0.4 0.3 - 1.2 mg/dL   GFR calc non Af Amer 51 (L) >60 mL/min   GFR calc Af Amer 59 (L) >60 mL/min    Comment: (NOTE) The eGFR has been calculated using the CKD EPI equation. This calculation has not been validated in all clinical  situations. eGFR's persistently <60 mL/min signify possible Chronic Kidney Disease.    Anion gap 6 5 - 15  Culture, blood (routine x 2)     Status: None (Preliminary result)   Collection Time: 05/21/15  7:14 PM  Result Value Ref Range   Specimen Description BLOOD LEFT ARM    Special Requests BOTTLES DRAWN AEROBIC AND ANAEROBIC 10CC    Culture      NO GROWTH < 24 HOURS Performed at [facility]    Report Status PENDING   Glucose, capillary     Status: Abnormal   Collection Time: 05/21/15  8:41 PM  Result Value Ref Range   Glucose-Capillary 157 (H) 65 - 99 mg/dL  Comprehensive metabolic panel     Status: Abnormal   Collection Time: 05/22/15  5:02 AM  Result Value Ref Range   Sodium 135 135 - 145 mmol/L   Potassium 3.7 3.5 - 5.1 mmol/L  Chloride 106 101 - 111 mmol/L   CO2 21 (L) 22 - 32 mmol/L   Glucose, Bld 120 (H) 65 - 99 mg/dL   BUN 18 6 - 20 mg/dL   Creatinine, Ser 1.27 (H) 0.61 - 1.24 mg/dL   Calcium 8.6 (L) 8.9 - 10.3 mg/dL   Total Protein 6.0 (L) 6.5 - 8.1 g/dL   Albumin 3.4 (L) 3.5 - 5.0 g/dL   AST 35 15 - 41 U/L   ALT 32 17 - 63 U/L   Alkaline Phosphatase 28 (L) 38 - 126 U/L   Total Bilirubin 1.1 0.3 - 1.2 mg/dL   GFR calc non Af Amer 53 (L) >60 mL/min   GFR calc Af Amer >60 >60 mL/min    Comment: (NOTE) The eGFR has been calculated using the CKD EPI equation. This calculation has not been validated in all clinical situations. eGFR's persistently <60 mL/min signify possible Chronic Kidney Disease.    Anion gap 8 5 - 15  CBC     Status: Abnormal   Collection Time: 05/22/15  5:02 AM  Result Value Ref Range   WBC 15.1 (H) 4.0 - 10.5 K/uL   RBC 3.43 (L) 4.22 - 5.81 MIL/uL   Hemoglobin 10.4 (L) 13.0 - 17.0 g/dL   HCT 32.6 (L) 39.0 - 52.0 %   MCV 95.0 78.0 - 100.0 fL   MCH 30.3 26.0 - 34.0 pg   MCHC 31.9 30.0 - 36.0 g/dL   RDW 14.5 11.5 - 15.5 %   Platelets 166 150 - 400 K/uL  Glucose, capillary     Status: Abnormal   Collection Time: 05/22/15  8:05  AM  Result Value Ref Range   Glucose-Capillary 104 (H) 65 - 99 mg/dL  Glucose, capillary     Status: None   Collection Time: 05/22/15 11:27 AM  Result Value Ref Range   Glucose-Capillary 98 65 - 99 mg/dL  Glucose, capillary     Status: Abnormal   Collection Time: 05/22/15  4:59 PM  Result Value Ref Range   Glucose-Capillary 102 (H) 65 - 99 mg/dL  Glucose, capillary     Status: None   Collection Time: 05/22/15 10:26 PM  Result Value Ref Range   Glucose-Capillary 86 65 - 99 mg/dL  Comprehensive metabolic panel     Status: Abnormal   Collection Time: 05/23/15  4:50 AM  Result Value Ref Range   Sodium 134 (L) 135 - 145 mmol/L   Potassium 3.7 3.5 - 5.1 mmol/L   Chloride 105 101 - 111 mmol/L   CO2 22 22 - 32 mmol/L   Glucose, Bld 97 65 - 99 mg/dL   BUN 19 6 - 20 mg/dL   Creatinine, Ser 1.56 (H) 0.61 - 1.24 mg/dL   Calcium 8.7 (L) 8.9 - 10.3 mg/dL   Total Protein 6.1 (L) 6.5 - 8.1 g/dL   Albumin 3.2 (L) 3.5 - 5.0 g/dL   AST 22 15 - 41 U/L   ALT 25 17 - 63 U/L   Alkaline Phosphatase 28 (L) 38 - 126 U/L   Total Bilirubin 1.7 (H) 0.3 - 1.2 mg/dL   GFR calc non Af Amer 41 (L) >60 mL/min   GFR calc Af Amer 48 (L) >60 mL/min    Comment: (NOTE) The eGFR has been calculated using the CKD EPI equation. This calculation has not been validated in all clinical situations. eGFR's persistently <60 mL/min signify possible Chronic Kidney Disease.    Anion gap 7 5 - 15  CBC  Status: Abnormal   Collection Time: 05/23/15  4:50 AM  Result Value Ref Range   WBC 15.6 (H) 4.0 - 10.5 K/uL   RBC 3.39 (L) 4.22 - 5.81 MIL/uL   Hemoglobin 10.8 (L) 13.0 - 17.0 g/dL   HCT 32.2 (L) 39.0 - 52.0 %   MCV 95.0 78.0 - 100.0 fL   MCH 31.9 26.0 - 34.0 pg   MCHC 33.5 30.0 - 36.0 g/dL   RDW 14.7 11.5 - 15.5 %   Platelets 154 150 - 400 K/uL  Glucose, capillary     Status: None   Collection Time: 05/23/15  5:20 AM  Result Value Ref Range   Glucose-Capillary 95 65 - 99 mg/dL    Ct Abdomen Pelvis Wo  Contrast  05/21/2015   CLINICAL DATA:  Abdominal pain. Nausea. Bile duct obstruction with stenting. Cholelithiasis. Abdominal aortic aneurysm.  EXAM: CT ABDOMEN AND PELVIS WITHOUT CONTRAST  TECHNIQUE: Multidetector CT imaging of the abdomen and pelvis was performed following the standard protocol without IV contrast.  COMPARISON:  ERCP dated 07/05/2014 and MRI of the abdomen dated 06/30/2014  FINDINGS: Lower chest: Minimal scarring at the lung bases. Large hiatal hernia. Extensive coronary artery calcification. Extensive calcification in the mitral valve annulus. Heart size is normal.  Hepatobiliary: There is a 5 mm stone in the neck of the gallbladder. There is edema of the wall of the distended gallbladder. No biliary ductal dilatation. Biliary stent in place which appears in good position. Small amount of air in the biliary tree of the left lobe of the liver. Liver parenchyma is otherwise normal.  Pancreas: Normal.  Spleen: Normal.  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 Right adrenal gland is normal. Chronic right renal atrophy. Left kidney is normal. Bladder is normal.  Stomach/Bowel: Large hiatal hernia. Multiple diverticula in the proximal sigmoid portion of the colon. Terminal ileum and appendix are normal.  Vascular/Lymphatic: 4.9 cm saccular abdominal aortic aneurysm. Extensive aortic atherosclerosis. No adenopathy.  Musculoskeletal: No acute abnormality. 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 Previous lumbar fusions.  IMPRESSION: 1. Diffuse edema of the wall of the distended gallbladder with a stone lodged in the neck of the gallbladder. This could represent acute and possibly gangrenous cholecystitis. 2. Biliary stent appears in good position. 3. 4.9 cm saccular abdominal aortic aneurysm. 4. Large hiatal hernia.   Electronically Signed   By: Lorriane Shire M.D.   On: 05/21/2015 21:26   US Abdomen Complete  05/21/2015   CLINICAL DATA:  Acute  abdominal pain, biliary stent  EXAM: ULTRASOUND ABDOMEN COMPLETE  COMPARISON:  MRI abdomen dated 06/30/2014  FINDINGS: Gallbladder: Gallbladder sludge. Pericholecystic fluid. Negative sonographic Murphy's sign.  Common bile duct: Diameter: 12 mm.  Liver: No focal lesion identified. Within normal limits in parenchymal echogenicity. Intrahepatic ductal dilatation.  IVC: No abnormality visualized.  Pancreas: Not discretely visualized.  Spleen: Size and appearance within normal limits.  Right Kidney: Length: 9.0 cm.  No mass or hydronephrosis.  Left Kidney: Length: 12.0 cm.  No mass or hydronephrosis.  Abdominal aorta: 4.9 cm lower abdominal aortic aneurysm.  Other findings: None.  IMPRESSION: Mild intrahepatic/extrahepatic ductal dilatation. Common duct measures 12 mm.  Layering gallbladder sludge. No sonographic findings to suggest acute cholecystitis.  4.9 cm lower abdominal aortic aneurysm. Recommend followup by abdomen and pelvis CTA in 6 months, and vascular surgery referral/consultation if not already obtained. This recommendation follows ACR consensus guidelines: White Paper of the ACR Incidental Findings Committee II  on Vascular Findings. J Am Coll Radiol 2013; 10:789-794.   Electronically Signed   By: Julian Hy M.D.   On: 05/21/2015 17:06    Review of Systems  Constitutional: Negative for fever and diaphoresis.  HENT: Negative for congestion and sore throat.   Respiratory: Negative for cough and shortness of breath.   Cardiovascular: Positive for leg swelling. Negative for chest pain, orthopnea, claudication and PND.  Gastrointestinal: Positive for nausea and abdominal pain. Negative for vomiting, blood in stool and melena.  Musculoskeletal: Negative for myalgias.  Neurological: Negative for dizziness.  All other systems reviewed and are negative.  Blood pressure 137/51, pulse 85, temperature 98.8 F (37.1 C), temperature source Oral, resp. rate 18, height '5\' 9"'  (1.753 m), weight 207 lb  0.2 oz (93.9 kg), SpO2 95 %. Physical Exam  Nursing note and vitals reviewed. Constitutional: He is oriented to person, place, and time. He appears well-developed and well-nourished. No distress.  HENT:  Head: Normocephalic and atraumatic.  Eyes: EOM are normal. Pupils are equal, round, and reactive to light. No scleral icterus.  Neck: Normal range of motion. Neck supple. No JVD present.  Cardiovascular: Normal rate, regular rhythm, S1 normal and S2 normal.   Murmur heard.  Systolic murmur is present with a grade of 1/6  Pulses:      Radial pulses are 2+ on the right side, and 2+ on the left side.       Dorsalis pedis pulses are 2+ on the right side, and 2+ on the left side.  Respiratory: Effort normal. He has no wheezes. He has no rales.  Decreased breath sounds right base mild raspiness  GI: Soft. Bowel sounds are normal. He exhibits no distension.  Musculoskeletal: He exhibits no edema.  Lymphadenopathy:    He has no cervical adenopathy.  Neurological: He is alert and oriented to person, place, and time. He exhibits normal muscle tone.  Skin: Skin is warm and dry.  Psychiatric: He has a normal mood and affect.    Assessment/Plan: Principal Problem:   Biliary obstruction Active Problems:   Type II diabetes mellitus with complication -- CAD, AAA   Hypertension associated with diabetes   Hyperlipidemia LDL goal <70; statin intolerant   Chronic diastolic CHF (congestive heart failure)   S/P CABG x 2: 1997. SVG-OM (known to be occluded), LIMA-LAD   Cholelithiasis with cholecystitis    79yo male with a history of CAD, CABG x2-1997(LIMA to LAD, SVG to OM), PVCs, PACs, OSA, HTN, dyslipidemia, AAA.  His last cardiac cath was 06/28/14. The sites of PCI done in May 17, 2014, including the proximal RCA and obtuse marginal branch, were widely patent without restenosis.  Both stents were drug eluding. There was no change in the previous diffusely diseased AV groove circumflex, which is a  small-caliber vessel with a prominent region of ectasia.  LV function ~45%.    Patient is a no complaints of angina since he last saw Dr. Ellyn Hack in April. At that time Dr. Ellyn Hack consider him low risk for potential surgery.  I see no reason to change this assessment. EKG is essentially unchanged from prior. Patient has been off Plavix for 4 days now.  Tarri Fuller, [facility] 05/23/2015, 7:46 AM    Patient examined chart reviewed.  Pleas reference exhaustive note by Dr Ellyn Hack from April  He is now a year out from DES to RCA/OM Has no angina.  Has ongoing issues with biliary Cholic.  Should be no issue stopping plavix for surgery.  Continue beta blocker.  Should be ok to take 81 mg of aspirin and would ask surgery to write for this and also let us know when  plavix can be resumed post op.  Will follow.  Exam remarkable for some abdominal tenderness.  Lungs clear no murmur  Telemetry with NSR no arrhythmia  Jenkins Rouge

## 2015-05-23 NOTE — Transfer of Care (Signed)
Immediate Anesthesia Transfer of Care Note  Patient: [name]  Procedure(s) Performed: Procedure(s): LAPAROSCOPIC CHOLECYSTECTOMY WITH INTRAOPERATIVE CHOLANGIOGRAM (N/A)  Patient Location: PACU  Anesthesia Type:General  Level of Consciousness:  sedated, patient cooperative and responds to stimulation  Airway & Oxygen Therapy:Patient Spontanous Breathing and Patient connected to face mask oxgen  Post-op Assessment:  Report given to PACU RN and Post -op Vital signs reviewed and stable  Post vital signs:  Reviewed and stable  Last Vitals:  Filed Vitals:   05/23/15 1129  BP: 132/60  Pulse: 83  Temp: 37.6 C  Resp: 18    Complications: No apparent anesthesia complications

## 2015-05-23 NOTE — Progress Notes (Signed)
Patient evaluated in PACU s/p laparoscopic cholecystectomy for EKG concerning for possible STEMI.    Briefly, patient is a 79yo M with extensive CAD history including MI, 2v CABG, and multiple cardiac stents who presented to hospital with worsening cholecystitis.  Patient seen preoperatively by cardiology and cleared for surgery.  Most recent echo showed normal EF.   During surgery, patient appeared to convert into Atrial fibrillation but remained hemodynamically stable.  Patient extubated in OR and taken to PACU.  In PACU, post-op 12 lead EKG concerning for A-fib with RVR and new T wave inversions seen in anterior leads compared to preop EKG.  Surgeon and anesthesiologist at bedside in PACU to assess patient.  Patient denies CP, SOB, satting 100% on simple facemask and hemodynamically stable.  Cardiology was consulted and asked for assistance in EKG interpretation and agreed that no STEMI apparent but agreed that patient in new onset A-fib.  1 set of cardiac enzymes sent to lab and are currently pending.    Plan is to send patient to stepdown unit with telemetry, and Cardiology will continue to follow patient.  Hoy Morn, MD Anesthesiology

## 2015-05-23 NOTE — Anesthesia Preprocedure Evaluation (Signed)
Anesthesia Evaluation  Patient identified by MRN, date of birth, ID band Patient awake  General Assessment Comment:Clear liquids @ 1230  Reviewed: Allergy & Precautions, H&P , NPO status , Patient's Chart, lab work & pertinent test results, reviewed documented beta blocker date and time   Airway Mallampati: I  TM Distance: >3 FB Neck ROM: Full    Dental  (+) Partial Upper, Dental Advisory Given, Partial Lower, Missing,    Pulmonary shortness of breath and with exertion, sleep apnea and Continuous Positive Airway Pressure Ventilation , former smoker,  breath sounds clear to auscultation        Cardiovascular Exercise Tolerance: Poor hypertension, Pt. on medications and Pt. on home beta blockers + angina + CAD, + Past MI, + Cardiac Stents, + Peripheral Vascular Disease and +CHF Rhythm:Regular Rate:Normal     Neuro/Psych negative neurological ROS  negative psych ROS   GI/Hepatic Neg liver ROS, GERD-  Medicated and Controlled,  Endo/Other  diabetes, Well Controlled, Type 2, Oral Hypoglycemic Agents  Renal/GU Renal InsufficiencyRenal disease     Musculoskeletal  (+) Arthritis -,   Abdominal   Peds  Hematology  (+) Blood dyscrasia, anemia ,   Anesthesia Other Findings   Reproductive/Obstetrics                             Anesthesia Physical  Anesthesia Plan  ASA: IV  Anesthesia Plan: General   Post-op Pain Management:    Induction: Intravenous  Airway Management Planned: Oral ETT  Additional Equipment:   Intra-op Plan:   Post-operative Plan: Extubation in OR and Possible Post-op intubation/ventilation  Informed Consent: I have reviewed the patients History and Physical, chart, labs and discussed the procedure including the risks, benefits and alternatives for the proposed anesthesia with the patient or authorized representative who has indicated his/her understanding and acceptance.    Dental advisory given  Plan Discussed with: CRNA and Anesthesiologist  Anesthesia Plan Comments: (Risks/benefits of general anesthesia discussed with patient including risk of damage to teeth, lips, gum, and tongue, nausea/vomiting, allergic reactions to medications, and the possibility of heart attack, stroke and death.  All patient questions answered.  Patient wishes to proceed.  Discussed increased risk of cardiac complications given extensive cardiac history.)        Anesthesia Quick Evaluation

## 2015-05-23 NOTE — Progress Notes (Signed)
Patient ID: [name], male   DOB: [birth year], 79 y.o.   MRN: [id number]     [facility]      [address] [place], [place] [address]    Phone: [phone] FAX: [phone]     Subjective: RUQ pain.  No sob, cp, palpitations.  Passing flatus.  No n/v.  Heparin held.    Objective:  Vital signs:  Filed Vitals:   05/22/15 1325 05/22/15 1844 05/22/15 2344 05/23/15 0545  BP: 143/61 140/61 139/67 137/51  Pulse: 76 76 92 85  Temp: 98.7 F (37.1 C) 98.3 F (36.8 C) 99.6 F (37.6 C) 98.8 F (37.1 C)  TempSrc: Oral Oral Oral Oral  Resp: '20 18 18 18  ' Height:      Weight:    93.9 kg (207 lb 0.2 oz)  SpO2: 98% 96% 95% 95%    Last BM Date: 05/20/15  Intake/Output   Yesterday:  08/18 0701 - 08/19 0700 In: 1898.8 [I.V.:1798.8; IV Piggyback:100] Out: 700 [Urine:700] This shift:    I/O last 3 completed shifts: In: 1898.8 [I.V.:1798.8; IV Piggyback:100] Out: 700 [Urine:700]   Physical Exam: General: Pt awake/alert/oriented x4 in no acute distress Chest: cta.  No chest wall pain w good excursion CV:  Pulses intact.  Regular rhythm Abdomen: Soft.  Nondistended.  TTP RUQ.   No evidence of peritonitis.  No incarcerated hernias. Ext:  SCDs BLE. Trace pedal edema.    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), LIMA-LAD   Cholelithiasis with cholecystitis    Results:   Labs: Results for orders placed or performed during the hospital encounter of 05/21/15 (from the past 48 hour(s))  CBC     Status: Abnormal   Collection Time: 05/21/15  7:05 PM  Result Value Ref Range   WBC 13.7 (H) 4.0 - 10.5 K/uL   RBC 3.52 (L) 4.22 - 5.81 MIL/uL    Hemoglobin 10.8 (L) 13.0 - 17.0 g/dL   HCT 33.6 (L) 39.0 - 52.0 %   MCV 95.5 78.0 - 100.0 fL   MCH 30.7 26.0 - 34.0 pg   MCHC 32.1 30.0 - 36.0 g/dL   RDW 14.4 11.5 - 15.5 %   Platelets 171 150 - 400 K/uL  Creatinine, serum     Status: Abnormal   Collection Time: 05/21/15  7:05 PM  Result Value Ref Range   Creatinine, Ser 1.30 (H) 0.61 - 1.24 mg/dL   GFR calc non Af Amer 51 (L) >60 mL/min   GFR calc Af Amer 59 (L) >60 mL/min    Comment: (NOTE) The eGFR has been calculated using the CKD EPI equation. This calculation has not been validated in all clinical situations. eGFR's persistently <60 mL/min signify possible Chronic Kidney Disease.   Culture, blood (routine x 2)     Status: None (Preliminary result)   Collection Time: 05/21/15  7:05 PM  Result Value Ref Range   Specimen Description BLOOD LEFT ARM    Special Requests BOTTLES DRAWN AEROBIC AND ANAEROBIC 5CC    Culture      NO GROWTH < 24 HOURS Performed at [facility]    Report Status PENDING   Comprehensive metabolic  panel     Status: Abnormal   Collection Time: 05/21/15  7:05 PM  Result Value Ref Range   Sodium 136 135 - 145 mmol/L   Potassium 4.1 3.5 - 5.1 mmol/L   Chloride 106 101 - 111 mmol/L   CO2 24 22 - 32 mmol/L   Glucose, Bld 204 (H) 65 - 99 mg/dL   BUN 20 6 - 20 mg/dL   Creatinine, Ser 1.30 (H) 0.61 - 1.24 mg/dL   Calcium 8.9 8.9 - 10.3 mg/dL   Total Protein 7.0 6.5 - 8.1 g/dL   Albumin 3.7 3.5 - 5.0 g/dL   AST 57 (H) 15 - 41 U/L   ALT 42 17 - 63 U/L   Alkaline Phosphatase 32 (L) 38 - 126 U/L   Total Bilirubin 0.4 0.3 - 1.2 mg/dL   GFR calc non Af Amer 51 (L) >60 mL/min   GFR calc Af Amer 59 (L) >60 mL/min    Comment: (NOTE) The eGFR has been calculated using the CKD EPI equation. This calculation has not been validated in all clinical situations. eGFR's persistently <60 mL/min signify possible Chronic Kidney Disease.    Anion gap 6 5 - 15  Culture, blood (routine x 2)     Status: None  (Preliminary result)   Collection Time: 05/21/15  7:14 PM  Result Value Ref Range   Specimen Description BLOOD LEFT ARM    Special Requests BOTTLES DRAWN AEROBIC AND ANAEROBIC 10CC    Culture      NO GROWTH < 24 HOURS Performed at [facility]    Report Status PENDING   Glucose, capillary     Status: Abnormal   Collection Time: 05/21/15  8:41 PM  Result Value Ref Range   Glucose-Capillary 157 (H) 65 - 99 mg/dL  Comprehensive metabolic panel     Status: Abnormal   Collection Time: 05/22/15  5:02 AM  Result Value Ref Range   Sodium 135 135 - 145 mmol/L   Potassium 3.7 3.5 - 5.1 mmol/L   Chloride 106 101 - 111 mmol/L   CO2 21 (L) 22 - 32 mmol/L   Glucose, Bld 120 (H) 65 - 99 mg/dL   BUN 18 6 - 20 mg/dL   Creatinine, Ser 1.27 (H) 0.61 - 1.24 mg/dL   Calcium 8.6 (L) 8.9 - 10.3 mg/dL   Total Protein 6.0 (L) 6.5 - 8.1 g/dL   Albumin 3.4 (L) 3.5 - 5.0 g/dL   AST 35 15 - 41 U/L   ALT 32 17 - 63 U/L   Alkaline Phosphatase 28 (L) 38 - 126 U/L   Total Bilirubin 1.1 0.3 - 1.2 mg/dL   GFR calc non Af Amer 53 (L) >60 mL/min   GFR calc Af Amer >60 >60 mL/min    Comment: (NOTE) The eGFR has been calculated using the CKD EPI equation. This calculation has not been validated in all clinical situations. eGFR's persistently <60 mL/min signify possible Chronic Kidney Disease.    Anion gap 8 5 - 15  CBC     Status: Abnormal   Collection Time: 05/22/15  5:02 AM  Result Value Ref Range   WBC 15.1 (H) 4.0 - 10.5 K/uL   RBC 3.43 (L) 4.22 - 5.81 MIL/uL   Hemoglobin 10.4 (L) 13.0 - 17.0 g/dL   HCT 32.6 (L) 39.0 - 52.0 %   MCV 95.0 78.0 - 100.0 fL   MCH 30.3 26.0 - 34.0 pg   MCHC 31.9 30.0 - 36.0 g/dL  RDW 14.5 11.5 - 15.5 %   Platelets 166 150 - 400 K/uL  Glucose, capillary     Status: Abnormal   Collection Time: 05/22/15  8:05 AM  Result Value Ref Range   Glucose-Capillary 104 (H) 65 - 99 mg/dL  Glucose, capillary     Status: None   Collection Time: 05/22/15 11:27 AM  Result  Value Ref Range   Glucose-Capillary 98 65 - 99 mg/dL  Glucose, capillary     Status: Abnormal   Collection Time: 05/22/15  4:59 PM  Result Value Ref Range   Glucose-Capillary 102 (H) 65 - 99 mg/dL  Glucose, capillary     Status: None   Collection Time: 05/22/15 10:26 PM  Result Value Ref Range   Glucose-Capillary 86 65 - 99 mg/dL  Comprehensive metabolic panel     Status: Abnormal   Collection Time: 05/23/15  4:50 AM  Result Value Ref Range   Sodium 134 (L) 135 - 145 mmol/L   Potassium 3.7 3.5 - 5.1 mmol/L   Chloride 105 101 - 111 mmol/L   CO2 22 22 - 32 mmol/L   Glucose, Bld 97 65 - 99 mg/dL   BUN 19 6 - 20 mg/dL   Creatinine, Ser 1.56 (H) 0.61 - 1.24 mg/dL   Calcium 8.7 (L) 8.9 - 10.3 mg/dL   Total Protein 6.1 (L) 6.5 - 8.1 g/dL   Albumin 3.2 (L) 3.5 - 5.0 g/dL   AST 22 15 - 41 U/L   ALT 25 17 - 63 U/L   Alkaline Phosphatase 28 (L) 38 - 126 U/L   Total Bilirubin 1.7 (H) 0.3 - 1.2 mg/dL   GFR calc non Af Amer 41 (L) >60 mL/min   GFR calc Af Amer 48 (L) >60 mL/min    Comment: (NOTE) The eGFR has been calculated using the CKD EPI equation. This calculation has not been validated in all clinical situations. eGFR's persistently <60 mL/min signify possible Chronic Kidney Disease.    Anion gap 7 5 - 15  CBC     Status: Abnormal   Collection Time: 05/23/15  4:50 AM  Result Value Ref Range   WBC 15.6 (H) 4.0 - 10.5 K/uL   RBC 3.39 (L) 4.22 - 5.81 MIL/uL   Hemoglobin 10.8 (L) 13.0 - 17.0 g/dL   HCT 32.2 (L) 39.0 - 52.0 %   MCV 95.0 78.0 - 100.0 fL   MCH 31.9 26.0 - 34.0 pg   MCHC 33.5 30.0 - 36.0 g/dL   RDW 14.7 11.5 - 15.5 %   Platelets 154 150 - 400 K/uL  Glucose, capillary     Status: None   Collection Time: 05/23/15  5:20 AM  Result Value Ref Range   Glucose-Capillary 95 65 - 99 mg/dL    Imaging / Studies: Ct Abdomen Pelvis Wo Contrast  05/21/2015   CLINICAL DATA:  Abdominal pain. Nausea. Bile duct obstruction with stenting. Cholelithiasis. Abdominal aortic  aneurysm.  EXAM: CT ABDOMEN AND PELVIS WITHOUT CONTRAST  TECHNIQUE: Multidetector CT imaging of the abdomen and pelvis was performed following the standard protocol without IV contrast.  COMPARISON:  ERCP dated 07/05/2014 and MRI of the abdomen dated 06/30/2014  FINDINGS: Lower chest: Minimal scarring at the lung bases. Large hiatal hernia. Extensive coronary artery calcification. Extensive calcification in the mitral valve annulus. Heart size is normal.  Hepatobiliary: There is a 5 mm stone in the neck of the gallbladder. There is edema of the wall of the distended gallbladder. No biliary ductal dilatation.  Biliary stent in place which appears in good position. Small amount of air in the biliary tree of the left lobe of the liver. Liver parenchyma is otherwise normal.  Pancreas: Normal.  Spleen: Normal.  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 Right adrenal gland is normal. Chronic right renal atrophy. Left kidney is normal. Bladder is normal.  Stomach/Bowel: Large hiatal hernia. Multiple diverticula in the proximal sigmoid portion of the colon. Terminal ileum and appendix are normal.  Vascular/Lymphatic: 4.9 cm saccular abdominal aortic aneurysm. Extensive aortic atherosclerosis. No adenopathy.  Musculoskeletal: No acute abnormality. 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 Previous lumbar fusions.  IMPRESSION: 1. Diffuse edema of the wall of the distended gallbladder with a stone lodged in the neck of the gallbladder. This could represent acute and possibly gangrenous cholecystitis. 2. Biliary stent appears in good position. 3. 4.9 cm saccular abdominal aortic aneurysm. 4. Large hiatal hernia.   Electronically Signed   By: Lorriane Shire M.D.   On: 05/21/2015 21:26   US Abdomen Complete  05/21/2015   CLINICAL DATA:  Acute abdominal pain, biliary stent  EXAM: ULTRASOUND ABDOMEN COMPLETE  COMPARISON:  MRI abdomen dated 06/30/2014  FINDINGS: Gallbladder:  Gallbladder sludge. Pericholecystic fluid. Negative sonographic Murphy's sign.  Common bile duct: Diameter: 12 mm.  Liver: No focal lesion identified. Within normal limits in parenchymal echogenicity. Intrahepatic ductal dilatation.  IVC: No abnormality visualized.  Pancreas: Not discretely visualized.  Spleen: Size and appearance within normal limits.  Right Kidney: Length: 9.0 cm.  No mass or hydronephrosis.  Left Kidney: Length: 12.0 cm.  No mass or hydronephrosis.  Abdominal aorta: 4.9 cm lower abdominal aortic aneurysm.  Other findings: None.  IMPRESSION: Mild intrahepatic/extrahepatic ductal dilatation. Common duct measures 12 mm.  Layering gallbladder sludge. No sonographic findings to suggest acute cholecystitis.  4.9 cm lower abdominal aortic aneurysm. Recommend followup by abdomen and pelvis CTA in 6 months, and vascular surgery referral/consultation if not already obtained. This recommendation follows ACR consensus guidelines: White Paper of the ACR Incidental Findings Committee II on Vascular Findings. J Am Coll Radiol 2013; 10:789-794.   Electronically Signed   By: Julian Hy M.D.   On: 05/21/2015 17:06    Medications / Allergies:  Scheduled Meds: . amLODipine  5 mg Oral Daily  . ezetimibe  10 mg Oral Daily  . ferrous sulfate  325 mg Oral Q breakfast  . folic acid  1 mg Oral Daily  . heparin  5,000 Units Subcutaneous 3 times per day  . insulin aspart  0-15 Units Subcutaneous TID WC  . insulin aspart  0-5 Units Subcutaneous QHS  . LORazepam  0-4 mg Oral Q6H   Followed by  . LORazepam  0-4 mg Oral Q12H  . losartan  50 mg Oral Daily  . metoprolol succinate  25 mg Oral Daily  . multivitamin with minerals  1 tablet Oral Daily  . pantoprazole  40 mg Oral Daily  . piperacillin-tazobactam (ZOSYN)  IV  3.375 g Intravenous 3 times per day  . sodium chloride  3 mL Intravenous Q12H  . thiamine  100 mg Oral Daily   Or  . thiamine  100 mg Intravenous Daily   Continuous Infusions: .  sodium chloride 0.45 % 1,000 mL with potassium chloride 20 mEq infusion 75 mL/hr at 05/22/15 1446   PRN Meds:.bisacodyl, HYDROmorphone (DILAUDID) injection, LORazepam **OR** LORazepam, morphine injection, ondansetron **OR** ondansetron (ZOFRAN) IV  Antibiotics: Anti-infectives    Start  Dose/Rate Route Frequency Ordered Stop   05/22/15 1200  piperacillin-tazobactam (ZOSYN) IVPB 3.375 g     3.375 g 12.5 mL/hr over 240 Minutes Intravenous 3 times per day 05/22/15 1019     05/22/15 0000  Ampicillin-Sulbactam (UNASYN) 3 g in sodium chloride 0.9 % 100 mL IVPB  Status:  Discontinued     3 g 100 mL/hr over 60 Minutes Intravenous Every 6 hours 05/21/15 2124 05/22/15 1019   05/21/15 1745  Ampicillin-Sulbactam (UNASYN) 3 g in sodium chloride 0.9 % 100 mL IVPB     3 g 100 mL/hr over 60 Minutes Intravenous  Once 05/21/15 1731 05/21/15 1921        Assessment/Plan Cholecystitis and cholelithiasis-last dose of Plavix was AM 8/15.    Cards cleared.    To OR later today for laparoscopic cholecystectomy with IOC.  I once again discussed surgical risks with the patient including but not limited to infection, bleeding, injury to surrounding structures, open surgery, anesthesia risks.  He verbalizes understanding and wishes to proceed.    Biliary obstruction-s/p stent. Will need an ERCP with stent removal per GI. CV-dCHF, HTN, CAD s/p CABG, PCI 8/15. 4.9cm AAA. cards following.  plavix and ASA on hold.  Will determine when to resume both post operatively.  DM II VTE prophylaxis-SCD/heparin ID-zosyn   FEN-continue NPO, IVF and pain control Dispo-OR today   Erby Pian, ANP-BC [facility] Pager [phone])   05/23/2015 9:20 AM  Agree with above. Appreciate cardiology assistance. For lap chole today. I went over the surgery thoroughly last night with the patient.  His wife is here today.  Alphonsa Overall, MD, [facility] Surgery Pager:  [phone] Office phone:  [phone]

## 2015-05-23 NOTE — Progress Notes (Signed)
Pt admitted to pacu in afib, diaphoretic.  EKG performed.  Md at bedside.  Cardiology paged.

## 2015-05-23 NOTE — Care Management Important Message (Signed)
Important Message  Patient Details  Name: [name] MRN: [id number] Date of Birth: October 21, 1935   Medicare Important Message Given:  Yes-second notification given    Camillo Flaming 05/23/2015, 11:48 AMImportant Message  Patient Details  Name: [name] MRN: [id number] Date of Birth: 27-Jan-1936   Medicare Important Message Given:  Yes-second notification given    Camillo Flaming 05/23/2015, 11:48 AM

## 2015-05-23 NOTE — Op Note (Signed)
05/21/2015 - 05/23/2015  3:37 PM  PATIENT:  [name], 79 y.o., male, MRN: [id number]  PREOP DIAGNOSIS:  cholecystitis  POSTOP DIAGNOSIS:   Acute cholecystitis with impacted stone (patchy areas of gall bladder infarction)  PROCEDURE:   Procedure(s): LAPAROSCOPIC CHOLECYSTECTOMY WITH INTRAOPERATIVE CHOLANGIOGRAM  SURGEON:   Alphonsa Overall, M.D.  ASSISTANT:   Wenda Overland, NP  ANESTHESIA:   general  Anesthesiologist: Catalina Gravel, MD CRNA: Donnie Coffin, CRNA; Verlin Grills, CRNA  General  ASA:  4E  EBL:  100  ml  BLOOD ADMINISTERED: none  DRAINS: 19 F Blake drain  LOCAL MEDICATIONS USED:   30 cc 1/4% Marcaine  SPECIMEN:   Gall bladder  COUNTS CORRECT:  YES  INDICATIONS FOR PROCEDURE:  [name] is a 79 y.o. (DOB: Mar 13, 1936) whit  male whose primary care physician is [name], MD and comes for cholecystectomy.   He has had biliary trouble for some time.  He required a biliary stent placement on 07/03/2014.  Because of recent stent placement, he was deemed not a surgical candidate at that time.  He has presented with onset of RUQ pain.  He has been off his Plavix for 4 days and is far enough out from his stent placement that we can proceed with surgery.   The indications and risks of the gall bladder surgery were explained to the patient.  The risks include, but are not limited to, infection, bleeding, common bile duct injury and open surgery.  SURGERY:  The patient was taken to room #2 at [facility].  The abdomen was prepped with chloroprep.  The patient was on Zosyn prior to the beginning of the operation.   A time out was held and the surgical checklist run.   An infraumbilical incision was made into the abdominal cavity.  A 12 mm Hasson trocar was inserted into the abdominal cavity through the infraumbilical incision and secured with a 0 Vicryl suture.  Three additional trocars were inserted: a 10 mm trocar in the sub-xiphoid location, a 5 mm  trocar in the right mid subcostal area, and a 5 mm trocar in the right lateral subcostal area.   The abdomen was explored and the liver, stomach, and bowel that could be seen were unremarkable.   The gall bladder was encased in omentum.  There was purulence around the gall bladder.  The gall bladder was distended, had acute on chronic inflammation with patchy areas of infarction.  I decompressed the gall bladder and aspirated "white bile".  I grasped the gall bladder and rotated it cephalad.  Disssection was carried down to the gall bladder/cystic duct junction and the cystic duct isolated.  A clip was placed on the gall bladder side of the cystic duct.   An intra-operative cholangiogram was shot.   The intra-operative cholangiogram was shot using a cut off Taut catheter placed through a 14 gauge angiocath in the RUQ.  The Taut catheter was inserted in the cut cystic duct and secured with an endoclip.  A cholangiogram was shot with 10 cc of 1/2 strength Omnipaque.  Using fluoroscopy, the cholangiogram showed the flow of contrast into the common bile duct, up the hepatic radicals, and into the duodenum.  I could see the biliary stent, which went to the orifice of the cystic duct.  There also looked to be filling defects in the stent.  There was no mass or obstruction.     The Taut catheter was removed.  The cystic duct  was occluded with a PDS Endoloop.  I then placed a 2-0 Vicryl sutures around the cystic duct stump.  The gall bladder was bluntly and sharpley dissected from the gall bladder bed.   After the gall bladder was removed from the liver, the gall bladder bed and Triangle of [place] were inspected.  There was no bleeding or bile leak.  The gall bladder was placed in a endocatch bag and delivered through the umbilicus.  The abdomen was irrigated with 4,000 cc saline.  I placed a 64 F drain in the gall bladder bed and brought it out of the right lateral 5 mm port site.  It was sewn in place with a 2-0  nylon.   The trocars were then removed.  I infiltrated 30cc of 1/4% Marcaine into the incisions.  The umbilical port closed with a 0 Vicryl suture and the skin closed with 5-0 Monocryl.  The skin was painted with Dermabond.  The patient's sponge and needle count were correct.  The patient was transported to the RR.  The patient will get a EKG in the RR because of some cardiac irregularity.  I will also plan to put him in step down ICU.  Alphonsa Overall, MD, [facility] Surgery Pager: [phone] Office phone:  [phone]

## 2015-05-23 NOTE — Progress Notes (Signed)
CK-MB, troponin labs sent

## 2015-05-23 NOTE — Progress Notes (Signed)
[facility] PROGRESS NOTE  ICARUS PARTCH RCV:[id number] DOB: 10/29/1935 DOA: 05/21/2015 PCP: [name], MD  Assessment/Plan: 1. Biliary obstruction 1. Presented from GI clinic with worsening abd pain with prior stenting x 2. Abd [place] with evidence of obstruction with 66mm CBD. CT abd with evidence of GB wall thickening and possible stone obstruction 2. Held aspirin and plavix (last taken 8/16) in anticipation for surgery. Have consulted Cardiology for cardiac clearance 3. Pt for cholecystectomy today per Surgery and plans for stent removal afterwards 4. Cardiology recs to resume plavix as soon as it is OK with General Surgery 2. DM2 1. Will continue on SSI coverage for now 2. Recent a1c from 02/06/15 noted to be 6.2% 3. Glucose remains stable 3. CAD s/p CABG 1. Remains chest pain free 2. Per above, continuing to hold aspirin and plavix 3. Consulted Cardiology for cardiac clearance 4. HTN 1. BP remains stable 2. Will cont home meds 5. HLD 1. On zetia prior to admit 6. Chronic diastolic CHF 1. Most recent echo in 2013 with normal LVEF 2. Clinically euvolemic 7. Daily ETOH use 1. Reports drinking 4-5 beers daily, last use was on 8/16 2. Will continue on CIWA 3. Stable thus far 8. AAA 1. Abd US demonstrated a 4.9cm lower AAA with recs for follow up CTA abd/pelvis in 6 months 2. Discussed case with on-call Vascular Surgeon, Dr Kellie Simmering who recommended CT abd/pelvis to r/o AAA leak. If neg, recommendation to follow as outpatient with Vascular Surgery in 6 months 3. CT re-demonstrates 4.9cm AAA without mention of leak 9. DVT prophylaxis 1. Heparin subq while inpatient  Code Status: Full Family Communication: Pt in room Disposition Plan: Pending   Consultants:  GI  General Surgery  Cardiology  Discussed case with Vascular Surgery over phone  Procedures:  Laparoscopic Cholecystectomy 8/19  Antibiotics:  Unasyn 8/17>>> (indicate start date, and stop date  if known)  HPI/Subjective: Reports continued abd pain, but seems better today.  Objective: Filed Vitals:   05/23/15 0545 05/23/15 1129 05/23/15 1551 05/23/15 1600  BP: 137/51 132/60 113/63 118/63  Pulse: 85 83 107 107  Temp: 98.8 F (37.1 C) 99.7 F (37.6 C) 97.9 F (36.6 C)   TempSrc: Oral Oral    Resp: 18 18 24 24   Height:      Weight: 93.9 kg (207 lb 0.2 oz)     SpO2: 95% 96% 92% 99%    Intake/Output Summary (Last 24 hours) at 05/23/15 1607 Last data filed at 05/23/15 1554  Gross per 24 hour  Intake   2930 ml  Output    925 ml  Net   2005 ml   Filed Weights   05/21/15 1600 05/23/15 0545  Weight: 95.057 kg (209 lb 9 oz) 93.9 kg (207 lb 0.2 oz)    Exam:   General:  Awake, laying in bed, in nad  Cardiovascular: regular, s1, s2  Respiratory: normal resp effort, no wheezing  Abdomen: soft,obese, tender mainly in RUQ,   Musculoskeletal: perfused, no clubbing   Data Reviewed: Basic Metabolic Panel:  Recent Labs Lab 05/21/15 1905 05/22/15 0502 05/23/15 0450  NA 136 135 134*  K 4.1 3.7 3.7  CL 106 106 105  CO2 24 21* 22  GLUCOSE 204* 120* 97  BUN 20 18 19   CREATININE 1.30*  1.30* 1.27* 1.56*  CALCIUM 8.9 8.6* 8.7*   Liver Function Tests:  Recent Labs Lab 05/21/15 1018 05/21/15 1905 05/22/15 0502 05/23/15 0450  AST 60* 57* 35 22  ALT  32 42 32 25  ALKPHOS 32* 32* 28* 28*  BILITOT 0.8 0.4 1.1 1.7*  PROT 6.5 7.0 6.0* 6.1*  ALBUMIN 3.9 3.7 3.4* 3.2*    Recent Labs Lab 05/21/15 1018  LIPASE 31.0   No results for input(s): AMMONIA in the last 168 hours. CBC:  Recent Labs Lab 05/21/15 1018 05/21/15 1905 05/22/15 0502 05/23/15 0450  WBC 9.0 13.7* 15.1* 15.6*  NEUTROABS 7.2  --   --   --   HGB 11.3* 10.8* 10.4* 10.8*  HCT 34.0* 33.6* 32.6* 32.2*  MCV 94.8 95.5 95.0 95.0  PLT 185.0 171 166 154   Cardiac Enzymes: No results for input(s): CKTOTAL, CKMB, CKMBINDEX, TROPONINI in the last 168 hours. BNP (last 3 results) No results for  input(s): BNP in the last 8760 hours.  ProBNP (last 3 results)  Recent Labs  06/27/14 1954 07/04/14 1300  PROBNP 333.0 397.2    CBG:  Recent Labs Lab 05/22/15 2226 05/23/15 0520 05/23/15 1128 05/23/15 1356 05/23/15 1558  GLUCAP 86 95 88 81 97    Recent Results (from the past 240 hour(s))  Culture, blood (routine x 2)     Status: None (Preliminary result)   Collection Time: 05/21/15  7:05 PM  Result Value Ref Range Status   Specimen Description BLOOD LEFT ARM  Final   Special Requests BOTTLES DRAWN AEROBIC AND ANAEROBIC 5CC  Final   Culture   Final    NO GROWTH 2 DAYS Performed at [facility]    Report Status PENDING  Incomplete  Culture, blood (routine x 2)     Status: None (Preliminary result)   Collection Time: 05/21/15  7:14 PM  Result Value Ref Range Status   Specimen Description BLOOD LEFT ARM  Final   Special Requests BOTTLES DRAWN AEROBIC AND ANAEROBIC 10CC  Final   Culture   Final    NO GROWTH 2 DAYS Performed at [facility]    Report Status PENDING  Incomplete  Surgical pcr screen     Status: Abnormal   Collection Time: 05/23/15  7:53 AM  Result Value Ref Range Status   MRSA, PCR NEGATIVE NEGATIVE Final   Staphylococcus aureus POSITIVE (A) NEGATIVE Final    Comment:        The Xpert SA Assay (FDA approved for NASAL specimens in patients over 5 years of age), is one component of a comprehensive surveillance program.  Test performance has been validated by [facility] for patients greater than or equal to 71 year old. It is not intended to diagnose infection nor to guide or monitor treatment.      Studies: Ct Abdomen Pelvis Wo Contrast  05/21/2015   CLINICAL DATA:  Abdominal pain. Nausea. Bile duct obstruction with stenting. Cholelithiasis. Abdominal aortic aneurysm.  EXAM: CT ABDOMEN AND PELVIS WITHOUT CONTRAST  TECHNIQUE: Multidetector CT imaging of the abdomen and pelvis was performed following the standard protocol without  IV contrast.  COMPARISON:  ERCP dated 07/05/2014 and MRI of the abdomen dated 06/30/2014  FINDINGS: Lower chest: Minimal scarring at the lung bases. Large hiatal hernia. Extensive coronary artery calcification. Extensive calcification in the mitral valve annulus. Heart size is normal.  Hepatobiliary: There is a 5 mm stone in the neck of the gallbladder. There is edema of the wall of the distended gallbladder. No biliary ductal dilatation. Biliary stent in place which appears in good position. Small amount of air in the biliary tree of the left lobe of the liver. Liver parenchyma is  otherwise normal.  Pancreas: Normal.  Spleen: Normal.  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 Right adrenal gland is normal. Chronic right renal atrophy. Left kidney is normal. Bladder is normal.  Stomach/Bowel: Large hiatal hernia. Multiple diverticula in the proximal sigmoid portion of the colon. Terminal ileum and appendix are normal.  Vascular/Lymphatic: 4.9 cm saccular abdominal aortic aneurysm. Extensive aortic atherosclerosis. No adenopathy.  Musculoskeletal: No acute abnormality. 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 Previous lumbar fusions.  IMPRESSION: 1. Diffuse edema of the wall of the distended gallbladder with a stone lodged in the neck of the gallbladder. This could represent acute and possibly gangrenous cholecystitis. 2. Biliary stent appears in good position. 3. 4.9 cm saccular abdominal aortic aneurysm. 4. Large hiatal hernia.   Electronically Signed   By: Lorriane Shire M.D.   On: 05/21/2015 21:26   Dg Cholangiogram Operative  05/23/2015   CLINICAL DATA:  Cholecystectomy for cholecystitis. History of biliary stent placement.  EXAM: INTRAOPERATIVE CHOLANGIOGRAM  TECHNIQUE: Cholangiographic images from the C-arm fluoroscopic device were submitted for interpretation post-operatively. Please see the procedural report for the amount of contrast and the  fluoroscopy time utilized.  COMPARISON:  CT and ultrasound studies on 05/21/2015  FINDINGS: Intraoperative cholangiogram shows injected contrast entering a metallic stent within the common bile duct. There is flow of contrast into the duodenum with some irregular filling defects in the stent likely related to sludge and/or calculi. There may be some subtle filling defects in the duct superior to the stent. No contrast extravasation identified.  IMPRESSION: Open metallic common bile duct stent with some probable sludge and/or calculi within the stent lumen.   Electronically Signed   By: Aletta Edouard M.D.   On: 05/23/2015 15:12   US Abdomen Complete  05/21/2015   CLINICAL DATA:  Acute abdominal pain, biliary stent  EXAM: ULTRASOUND ABDOMEN COMPLETE  COMPARISON:  MRI abdomen dated 06/30/2014  FINDINGS: Gallbladder: Gallbladder sludge. Pericholecystic fluid. Negative sonographic Murphy's sign.  Common bile duct: Diameter: 12 mm.  Liver: No focal lesion identified. Within normal limits in parenchymal echogenicity. Intrahepatic ductal dilatation.  IVC: No abnormality visualized.  Pancreas: Not discretely visualized.  Spleen: Size and appearance within normal limits.  Right Kidney: Length: 9.0 cm.  No mass or hydronephrosis.  Left Kidney: Length: 12.0 cm.  No mass or hydronephrosis.  Abdominal aorta: 4.9 cm lower abdominal aortic aneurysm.  Other findings: None.  IMPRESSION: Mild intrahepatic/extrahepatic ductal dilatation. Common duct measures 12 mm.  Layering gallbladder sludge. No sonographic findings to suggest acute cholecystitis.  4.9 cm lower abdominal aortic aneurysm. Recommend followup by abdomen and pelvis CTA in 6 months, and vascular surgery referral/consultation if not already obtained. This recommendation follows ACR consensus guidelines: White Paper of the ACR Incidental Findings Committee II on Vascular Findings. J Am Coll Radiol 2013; 10:789-794.   Electronically Signed   By: Julian Hy M.D.    On: 05/21/2015 17:06    Scheduled Meds: . [MAR Hold] amLODipine  5 mg Oral Daily  . [MAR Hold] ezetimibe  10 mg Oral Daily  . [MAR Hold] ferrous sulfate  325 mg Oral Q breakfast  . [MAR Hold] folic acid  1 mg Oral Daily  . [MAR Hold] heparin  5,000 Units Subcutaneous 3 times per day  . [MAR Hold] insulin aspart  0-15 Units Subcutaneous TID WC  . [MAR Hold] insulin aspart  0-5 Units Subcutaneous QHS  . [MAR Hold] LORazepam  0-4 mg Oral Q6H  Followed by  . [MAR Hold] LORazepam  0-4 mg Oral Q12H  . [MAR Hold] losartan  50 mg Oral Daily  . [MAR Hold] metoprolol succinate  25 mg Oral Daily  . [MAR Hold] multivitamin with minerals  1 tablet Oral Daily  . [MAR Hold] pantoprazole  40 mg Oral Daily  . [MAR Hold] piperacillin-tazobactam (ZOSYN)  IV  3.375 g Intravenous 3 times per day  . [MAR Hold] sodium chloride  3 mL Intravenous Q12H  . [MAR Hold] thiamine  100 mg Oral Daily   Or  . [MAR Hold] thiamine  100 mg Intravenous Daily   Continuous Infusions: . lactated ringers 1,000 mL (05/23/15 1251)  . sodium chloride 0.45 % 1,000 mL with potassium chloride 20 mEq infusion 75 mL/hr at 05/22/15 1446    Principal Problem:   Biliary obstruction Active Problems:   Type II diabetes mellitus with complication -- CAD, AAA   Hypertension associated with diabetes   Hyperlipidemia LDL goal <70; statin intolerant   Chronic diastolic CHF (congestive heart failure)   S/P CABG x 2: 1997. SVG-OM (known to be occluded), LIMA-LAD   Cholelithiasis with cholecystitis    [facility], [facility] Pager [phone]. If 7PM-7AM, please contact night-coverage at [email or web address], password [facility] 05/23/2015, 4:07 PM  LOS: 2 days

## 2015-05-23 NOTE — Progress Notes (Signed)
Pt to 1228 from PACU via stretcher; in no apparent distress, denies c/o pain; oriented to unit/staff; discussed fall risk prevention and safety; call bell and urinal within reach; see flowsheets for full assessment Blair Hailey, RN

## 2015-05-23 NOTE — Progress Notes (Signed)
RN notified the service for Dr. Lucia Gaskins. Hold Heparin Injection as per Dr. Johney Maine. Will add this to the orders.

## 2015-05-24 DIAGNOSIS — I5032 Chronic diastolic (congestive) heart failure: Secondary | ICD-10-CM

## 2015-05-24 LAB — GLUCOSE, CAPILLARY
GLUCOSE-CAPILLARY: 131 mg/dL — AB (ref 65–99)
GLUCOSE-CAPILLARY: 141 mg/dL — AB (ref 65–99)
GLUCOSE-CAPILLARY: 143 mg/dL — AB (ref 65–99)
GLUCOSE-CAPILLARY: 197 mg/dL — AB (ref 65–99)
Glucose-Capillary: 155 mg/dL — ABNORMAL HIGH (ref 65–99)

## 2015-05-24 MED ORDER — ASPIRIN EC 81 MG PO TBEC
81.0000 mg | DELAYED_RELEASE_TABLET | Freq: Every day | ORAL | Status: DC
  Administered 2015-05-25 – 2015-05-26 (×2): 81 mg via ORAL
  Filled 2015-05-24 (×3): qty 1

## 2015-05-24 MED ORDER — CLOPIDOGREL BISULFATE 75 MG PO TABS
75.0000 mg | ORAL_TABLET | Freq: Every day | ORAL | Status: DC
  Administered 2015-05-25 – 2015-05-26 (×2): 75 mg via ORAL
  Filled 2015-05-24 (×2): qty 1

## 2015-05-24 NOTE — Progress Notes (Signed)
Report called to Katharine Look, Therapist, sports.  Patient transferred via wheelchair to room 1429.  Wife at patient's side.

## 2015-05-24 NOTE — Progress Notes (Signed)
[facility] Progress Note  Subjective:   S/P lap chole yesterday.No CP or SOB. Feels much better--no abd pain.   Objective:  Vital signs in last 24 hours: Temp:  [97.4 F (36.3 C)-99.7 F (37.6 C)] 97.8 F (36.6 C) (08/20 0600) Pulse Rate:  [44-107] 57 (08/20 0800) Resp:  [14-28] 28 (08/20 0800) BP: (100-132)/(40-102) 120/80 mmHg (08/20 0700) SpO2:  [92 %-100 %] 95 % (08/20 0800) Weight:  [211 lb 10.3 oz (96 kg)] 211 lb 10.3 oz (96 kg) (08/19 1700) Last BM Date: 05/20/15 General:   Alert,  Well-developed,    in NAD Heart:  Regular rate and rhythm; no murmurs Pulm;lungs clear Abdomen:  Soft, Normal bowel sounds, without guarding, and without rebound.  Post op tenderness near portal sites. Extremities:  Without edema. Neurologic: Alert and  oriented x4;  grossly normal neurologically. Psych: Alert and cooperative. Normal mood and affect.  Intake/Output from previous day: 08/19 0701 - 08/20 0700 In: 2956.3 [I.V.:2806.3; IV Piggyback:150] Out: 1075 [Urine:975; Drains:100] Intake/Output this shift: Total I/O In: -  Out: 245 [Urine:225; Drains:20]  Lab Results:  Recent Labs  05/21/15 1905 05/22/15 0502 05/23/15 0450  WBC 13.7* 15.1* 15.6*  HGB 10.8* 10.4* 10.8*  HCT 33.6* 32.6* 32.2*  PLT 171 166 154   BMET  Recent Labs  05/21/15 1905 05/22/15 0502 05/23/15 0450  NA 136 135 134*  K 4.1 3.7 3.7  CL 106 106 105  CO2 24 21* 22  GLUCOSE 204* 120* 97  BUN 20 18 19   CREATININE 1.30*  1.30* 1.27* 1.56*  CALCIUM 8.9 8.6* 8.7*   LFT  Recent Labs  05/21/15 1018  05/23/15 0450  PROT 6.5  < > 6.1*  ALBUMIN 3.9  < > 3.2*  AST 60*  < > 22  ALT 32  < > 25  ALKPHOS 32*  < > 28*  BILITOT 0.8  < > 1.7*  BILIDIR 0.4*  --   --   < > = values in this interval not displayed. PT/INR No results for input(s): LABPROT, INR in the last 72 hours. Hepatitis Panel No results for input(s): HEPBSAG, HCVAB, HEPAIGM, HEPBIGM in the last 72 hours.  Dg  Cholangiogram Operative  05/23/2015   CLINICAL DATA:  Cholecystectomy for cholecystitis. History of biliary stent placement.  EXAM: INTRAOPERATIVE CHOLANGIOGRAM  TECHNIQUE: Cholangiographic images from the C-arm fluoroscopic device were submitted for interpretation post-operatively. Please see the procedural report for the amount of contrast and the fluoroscopy time utilized.  COMPARISON:  CT and ultrasound studies on 05/21/2015  FINDINGS: Intraoperative cholangiogram shows injected contrast entering a metallic stent within the common bile duct. There is flow of contrast into the duodenum with some irregular filling defects in the stent likely related to sludge and/or calculi. There may be some subtle filling defects in the duct superior to the stent. No contrast extravasation identified.  IMPRESSION: Open metallic common bile duct stent with some probable sludge and/or calculi within the stent lumen.   Electronically Signed   By: Aletta Edouard M.D.   On: 05/23/2015 15:12    ASSESSMENT/PLAN:  Biliary obstruction due to choledocholithiasis, cholelithiasis: Not a surgical candidate previously due to Plavix requirement from cardiac stent placement in 05/2014 (one year commitment).  --9/30 ERCP #1 with metal stent placement. Developed pancreatitis, cholangitis, stone occlusion of stent post-op  --10/2 ERCP # 2: Stent replaced, bile duct swept of stones and pus.  S/P lap chole yesterday--feels better. Plavix and ASA were held for surgery.  ASA to be restarted tomorrow per cardiology. Will review with Dr Deatra Ina as to timing of ERCP with stent removal.     LOS: 3 days   Jaterrius Ricketson, Deloris Ping 05/24/2015, Pager [phone]  Addendum--reviewed with Dr Deatra Ina. Pt to be scheduled for ERCP with stent removal as out pt. Will arrange for f/u in GI office.

## 2015-05-24 NOTE — Progress Notes (Signed)
Pt ambulated in hallway 200 feet. Lind Guest, RN

## 2015-05-24 NOTE — Progress Notes (Signed)
Plavix and aspirin were scheduled today. Called Dr. Zella Richer, [facility].  Instructed not to give aspirin or plavix today.

## 2015-05-24 NOTE — Progress Notes (Signed)
Patient ID: [name], male   DOB: 09-Jan-1936, 79 y.o.   MRN: [id number]    Subjective:  Denies SSCP, palpitations or Dyspnea Post lapcholy  Objective:  Filed Vitals:   05/24/15 0500 05/24/15 0600 05/24/15 0700 05/24/15 0800  BP: 113/58 104/50 120/80   Pulse: 47 46 52 57  Temp:  97.8 F (36.6 C)    TempSrc:  Axillary    Resp: 14 15 17 28   Height:      Weight:      SpO2: 99% 98% 99% 95%    Intake/Output from previous day:  Intake/Output Summary (Last 24 hours) at 05/24/15 9833 Last data filed at 05/24/15 0800  Gross per 24 hour  Intake 2956.25 ml  Output   1320 ml  Net 1636.25 ml    Physical Exam: Affect appropriate Elderly male taking PO HEENT: normal Neck supple with no adenopathy JVP normal no bruits no thyromegaly Lungs clear with no wheezing and good diaphragmatic motion Heart:  S1/S2 no murmur, no rub, gallop or click PMI normal Abdomen: BS positive post lapcholy no bruit.  No HSM or HJR Distal pulses intact with no bruits No edema Neuro non-focal Skin warm and dry No muscular weakness   Lab Results: Basic Metabolic Panel:  Recent Labs  05/22/15 0502 05/23/15 0450  NA 135 134*  K 3.7 3.7  CL 106 105  CO2 21* 22  GLUCOSE 120* 97  BUN 18 19  CREATININE 1.27* 1.56*  CALCIUM 8.6* 8.7*   Liver Function Tests:  Recent Labs  05/22/15 0502 05/23/15 0450  AST 35 22  ALT 32 25  ALKPHOS 28* 28*  BILITOT 1.1 1.7*  PROT 6.0* 6.1*  ALBUMIN 3.4* 3.2*    Recent Labs  05/21/15 1018  LIPASE 31.0   CBC:  Recent Labs  05/21/15 1018  05/22/15 0502 05/23/15 0450  WBC 9.0  < > 15.1* 15.6*  NEUTROABS 7.2  --   --   --   HGB 11.3*  < > 10.4* 10.8*  HCT 34.0*  < > 32.6* 32.2*  MCV 94.8  < > 95.0 95.0  PLT 185.0  < > 166 154  < > = values in this interval not displayed. Cardiac Enzymes:  Recent Labs  05/23/15 1615  CKTOTAL 36*  CKMB 15.5*  TROPONINI 0.03    Imaging: Dg Cholangiogram Operative  05/23/2015   CLINICAL DATA:   Cholecystectomy for cholecystitis. History of biliary stent placement.  EXAM: INTRAOPERATIVE CHOLANGIOGRAM  TECHNIQUE: Cholangiographic images from the C-arm fluoroscopic device were submitted for interpretation post-operatively. Please see the procedural report for the amount of contrast and the fluoroscopy time utilized.  COMPARISON:  CT and ultrasound studies on 05/21/2015  FINDINGS: Intraoperative cholangiogram shows injected contrast entering a metallic stent within the common bile duct. There is flow of contrast into the duodenum with some irregular filling defects in the stent likely related to sludge and/or calculi. There may be some subtle filling defects in the duct superior to the stent. No contrast extravasation identified.  IMPRESSION: Open metallic common bile duct stent with some probable sludge and/or calculi within the stent lumen.   Electronically Signed   By: Aletta Edouard M.D.   On: 05/23/2015 15:12    Cardiac Studies:  ECG:    Telemetry:  NSR no arrhythmia 05/24/2015   Echo:   EF 550-60% 2013    Medications:   . amLODipine  5 mg Oral Daily  . ezetimibe  10 mg Oral Daily  . ferrous  sulfate  325 mg Oral Q breakfast  . folic acid  1 mg Oral Daily  . heparin  5,000 Units Subcutaneous 3 times per day  . insulin aspart  0-15 Units Subcutaneous TID WC  . insulin aspart  0-5 Units Subcutaneous QHS  . LORazepam  0-4 mg Oral Q12H  . losartan  50 mg Oral Daily  . metoprolol succinate  25 mg Oral Daily  . multivitamin with minerals  1 tablet Oral Daily  . pantoprazole  40 mg Oral Daily  . piperacillin-tazobactam (ZOSYN)  IV  3.375 g Intravenous 3 times per day  . sodium chloride  3 mL Intravenous Q12H  . thiamine  100 mg Oral Daily   Or  . thiamine  100 mg Intravenous Daily     . lactated ringers 125 mL/hr at 05/23/15 2119  . sodium chloride 0.45 % 1,000 mL with potassium chloride 20 mEq infusion 75 mL/hr at 05/22/15 1446    Assessment/Plan:  CAD:  Post CABG with stents  to SVG RCA/OM a year ago  Plavix and Aspirin held for surgery per Dr Ellyn Hack.  No chest pain troponin negative  Repeat ECG Telemetry now NSR rates mid 60s continue beta blocker.  Resume Aspirin and beta blocker 8/21 discussed with Dr Hassell Done  HTN:  On ARB  GB: post lap choly inferior ECG changes may be due to GB inflammation as he has no chest pain and troponin negative.  Wounds look good And will need biliary stents removed ? 2-6 weeks   Jenkins Rouge 05/24/2015, 8:23 AM

## 2015-05-24 NOTE — Progress Notes (Signed)
Patient ID: [name], male   DOB: [birth year], 79 y.o.   MRN: [id number] [facility] Surgery Progress Note:   1 Day Post-Op  Subjective: Mental status is alert and feeling much better.   Objective: Vital signs in last 24 hours: Temp:  [97.4 F (36.3 C)-99.7 F (37.6 C)] 97.8 F (36.6 C) (08/20 0600) Pulse Rate:  [44-107] 57 (08/20 0800) Resp:  [14-28] 28 (08/20 0800) BP: (100-132)/(40-102) 120/80 mmHg (08/20 0700) SpO2:  [92 %-100 %] 95 % (08/20 0800) Weight:  [96 kg (211 lb 10.3 oz)] 96 kg (211 lb 10.3 oz) (08/19 1700)  Intake/Output from previous day: 08/19 0701 - 08/20 0700 In: 2956.3 [I.V.:2806.3; IV Piggyback:150] Out: 1075 [Urine:975; Drains:100] Intake/Output this shift: Total I/O In: -  Out: 20 [Drains:20]  Physical Exam: Work of breathing is normal.  JP is serosanguinous (no bile)  Lab Results:  Results for orders placed or performed during the hospital encounter of 05/21/15 (from the past 48 hour(s))  Glucose, capillary     Status: None   Collection Time: 05/22/15 11:27 AM  Result Value Ref Range   Glucose-Capillary 98 65 - 99 mg/dL  Glucose, capillary     Status: Abnormal   Collection Time: 05/22/15  4:59 PM  Result Value Ref Range   Glucose-Capillary 102 (H) 65 - 99 mg/dL  Glucose, capillary     Status: None   Collection Time: 05/22/15 10:26 PM  Result Value Ref Range   Glucose-Capillary 86 65 - 99 mg/dL  Comprehensive metabolic panel     Status: Abnormal   Collection Time: 05/23/15  4:50 AM  Result Value Ref Range   Sodium 134 (L) 135 - 145 mmol/L   Potassium 3.7 3.5 - 5.1 mmol/L   Chloride 105 101 - 111 mmol/L   CO2 22 22 - 32 mmol/L   Glucose, Bld 97 65 - 99 mg/dL   BUN 19 6 - 20 mg/dL   Creatinine, Ser 1.56 (H) 0.61 - 1.24 mg/dL   Calcium 8.7 (L) 8.9 - 10.3 mg/dL   Total Protein 6.1 (L) 6.5 - 8.1 g/dL   Albumin 3.2 (L) 3.5 - 5.0 g/dL   AST 22 15 - 41 U/L   ALT 25 17 - 63 U/L   Alkaline Phosphatase 28 (L) 38 - 126 U/L   Total Bilirubin  1.7 (H) 0.3 - 1.2 mg/dL   GFR calc non Af Amer 41 (L) >60 mL/min   GFR calc Af Amer 48 (L) >60 mL/min    Comment: (NOTE) The eGFR has been calculated using the CKD EPI equation. This calculation has not been validated in all clinical situations. eGFR's persistently <60 mL/min signify possible Chronic Kidney Disease.    Anion gap 7 5 - 15  CBC     Status: Abnormal   Collection Time: 05/23/15  4:50 AM  Result Value Ref Range   WBC 15.6 (H) 4.0 - 10.5 K/uL   RBC 3.39 (L) 4.22 - 5.81 MIL/uL   Hemoglobin 10.8 (L) 13.0 - 17.0 g/dL   HCT 32.2 (L) 39.0 - 52.0 %   MCV 95.0 78.0 - 100.0 fL   MCH 31.9 26.0 - 34.0 pg   MCHC 33.5 30.0 - 36.0 g/dL   RDW 14.7 11.5 - 15.5 %   Platelets 154 150 - 400 K/uL  Glucose, capillary     Status: None   Collection Time: 05/23/15  5:20 AM  Result Value Ref Range   Glucose-Capillary 95 65 - 99 mg/dL  Surgical pcr  screen     Status: Abnormal   Collection Time: 05/23/15  7:53 AM  Result Value Ref Range   MRSA, PCR NEGATIVE NEGATIVE   Staphylococcus aureus POSITIVE (A) NEGATIVE    Comment:        The Xpert SA Assay (FDA approved for NASAL specimens in patients over 76 years of age), is one component of a comprehensive surveillance program.  Test performance has been validated by [facility] for patients greater than or equal to 28 year old. It is not intended to diagnose infection nor to guide or monitor treatment.   Glucose, capillary     Status: None   Collection Time: 05/23/15 11:28 AM  Result Value Ref Range   Glucose-Capillary 88 65 - 99 mg/dL  Glucose, capillary     Status: None   Collection Time: 05/23/15  1:56 PM  Result Value Ref Range   Glucose-Capillary 81 65 - 99 mg/dL   Comment 1 Document in Chart   Glucose, capillary     Status: None   Collection Time: 05/23/15  3:58 PM  Result Value Ref Range   Glucose-Capillary 97 65 - 99 mg/dL  Troponin I (q 6hr x 3)     Status: None   Collection Time: 05/23/15  4:15 PM  Result Value Ref  Range   Troponin I 0.03 <0.031 ng/mL  CK total and CKMB (cardiac)not at [facility]     Status: Abnormal   Collection Time: 05/23/15  4:15 PM  Result Value Ref Range   Total CK 36 (L) 49 - 397 U/L   CK, MB 15.5 (H) 0.5 - 5.0 ng/mL   Relative Index RELATIVE INDEX IS INVALID 0.0 - 2.5    Comment: WHEN CK < 100 U/L        Performed at [facility]   Glucose, capillary     Status: Abnormal   Collection Time: 05/23/15  5:47 PM  Result Value Ref Range   Glucose-Capillary 119 (H) 65 - 99 mg/dL  Glucose, capillary     Status: Abnormal   Collection Time: 05/24/15 12:25 AM  Result Value Ref Range   Glucose-Capillary 197 (H) 65 - 99 mg/dL    Radiology/Results: Dg Cholangiogram Operative  05/23/2015   CLINICAL DATA:  Cholecystectomy for cholecystitis. History of biliary stent placement.  EXAM: INTRAOPERATIVE CHOLANGIOGRAM  TECHNIQUE: Cholangiographic images from the C-arm fluoroscopic device were submitted for interpretation post-operatively. Please see the procedural report for the amount of contrast and the fluoroscopy time utilized.  COMPARISON:  CT and ultrasound studies on 05/21/2015  FINDINGS: Intraoperative cholangiogram shows injected contrast entering a metallic stent within the common bile duct. There is flow of contrast into the duodenum with some irregular filling defects in the stent likely related to sludge and/or calculi. There may be some subtle filling defects in the duct superior to the stent. No contrast extravasation identified.  IMPRESSION: Open metallic common bile duct stent with some probable sludge and/or calculi within the stent lumen.   Electronically Signed   By: Aletta Edouard M.D.   On: 05/23/2015 15:12    Anti-infectives: Anti-infectives    Start     Dose/Rate Route Frequency Ordered Stop   05/22/15 1200  piperacillin-tazobactam (ZOSYN) IVPB 3.375 g     3.375 g 12.5 mL/hr over 240 Minutes Intravenous 3 times per day 05/22/15 1019     05/22/15 0000   Ampicillin-Sulbactam (UNASYN) 3 g in sodium chloride 0.9 % 100 mL IVPB  Status:  Discontinued  3 g 100 mL/hr over 60 Minutes Intravenous Every 6 hours 05/21/15 2124 05/22/15 1019   05/21/15 1745  Ampicillin-Sulbactam (UNASYN) 3 g in sodium chloride 0.9 % 100 mL IVPB     3 g 100 mL/hr over 60 Minutes Intravenous  Once 05/21/15 1731 05/21/15 1921      Assessment/Plan: Problem List: Patient Active Problem List   Diagnosis Date Noted  . AAA (abdominal aortic aneurysm)   . Cholelithiasis with cholecystitis 05/22/2015  . History of unstable angina 01/08/2015  . Presence of drug coated stent in right coronary artery and circumflex coronary artery 01/08/2015  . Medication management 01/08/2015  . Biliary obstruction 08/02/2014  . Calculus of bile duct 07/03/2014  . Chronic renal insufficiency, stage III (moderate) 06/06/2014  . Coronary stent restenosis with uncertain cause: Required PCI for ISR of OM1 (Promus P 2.75 mm x 12 mm) & pRCA 05/17/2014  . Obesity (BMI 30-39.9) 06/28/2013  . Encounter for long-term (current) use of other medications 06/28/2013  . AAA (abdominal aortic aneurysm) without rupture 04/16/2013  . ACE-inhibitor cough 04/16/2013  . Chronic diastolic CHF (congestive heart failure) 11/24/2012  . Type II diabetes mellitus with complication -- CAD, AAA 03/04/2011    Class: Diagnosis of  . Hypertension associated with diabetes 03/04/2011  . Hyperlipidemia LDL goal <70; statin intolerant 03/04/2011    Class: Diagnosis of  . ED (erectile dysfunction) of organic origin 03/04/2011  . GERD (gastroesophageal reflux disease) 03/04/2011  . S/P CABG x 2: 1997. SVG-OM (known to be occluded), LIMA-LAD 06/28/1996  . History of ST elevation myocardial infarction (STEMI) of inferolateral wall (PTCA - 100% large lateral OM) 06/28/1996    Class: History of  . CAD S/P CABG '95- several PCis since, last 05/17/14 06/28/1994    Class: History of    Doing well post lap chole for  gangrenous cholecystitis 1 Day Post-Op    LOS: 3 days   Matt B. Hassell Done, MD, [facility] [phone] beeper [phone]  05/24/2015 8:08 AM

## 2015-05-24 NOTE — Progress Notes (Signed)
[facility] PROGRESS NOTE  [name] EPP:[id number] DOB: 04-Feb-1936 DOA: 05/21/2015 PCP: [name], MD  Assessment/Plan: 1. Biliary obstruction secondary to Gangrenous Cholecystitis 1. Presented from GI clinic with worsening abd pain with prior stenting x 2. Abd [place] with evidence of obstruction with 60mm CBD. CT abd with evidence of GB wall thickening and possible stone obstruction 2. Held aspirin and plavix (last taken 8/16) for surgery. 3. Pt s/p cholecystectomy 8/19 per Surgery  4. GI plans of ERCP with stent removal later as outpatient 5. Cardiology recs to resume plavix as soon as it is OK with General Surgery 6. ASA planned to be restarted 8/21 per Cardiology 2. DM2 1. Will continue on SSI coverage for now 2. Recent a1c from 02/06/15 noted to be 6.2% 3. Glucose remains stable 3. CAD s/p CABG 1. Remains chest pain free 2. Per above, off aspirin and plavix, but plans to resume ASA on 8/21 3. Cardiology following 4. HTN 1. BP remains stable 2. Will cont home meds 5. HLD 1. On zetia prior to admit 6. Chronic diastolic CHF 1. Most recent echo in 2013 with normal LVEF 2. Clinically euvolemic 7. Daily ETOH use 1. Reports drinking 4-5 beers daily, last use was on 8/16 2. Will continue on CIWA 3. Stable thus far 8. AAA 1. Abd US demonstrated a 4.9cm lower AAA with recs for follow up CTA abd/pelvis in 6 months 2. Discussed case with on-call Vascular Surgeon, Dr Kellie Simmering who recommended CT abd/pelvis to r/o AAA leak. If neg, recommendation to follow as outpatient with Vascular Surgery in 6 months 3. CT re-demonstrates 4.9cm AAA without mention of leak 9. DVT prophylaxis 1. Heparin subq while inpatient  Code Status: Full Family Communication: Pt in room Disposition Plan: Pending   Consultants:  GI  General Surgery  Cardiology  Discussed case with Vascular Surgery over phone  Procedures:  Laparoscopic Cholecystectomy 8/19  Antibiotics:  Unasyn  8/17>>>8/18  Zosyn 8/18>>>   HPI/Subjective: States feeling much better today. Tolerating clears  Objective: Filed Vitals:   05/24/15 0800 05/24/15 0900 05/24/15 1200 05/24/15 1250  BP:   138/70 102/53  Pulse: 57  56 53  Temp:      TempSrc:      Resp: 28 23  16   Height:      Weight:      SpO2: 95% 98% 100% 97%    Intake/Output Summary (Last 24 hours) at 05/24/15 1438 Last data filed at 05/24/15 1200  Gross per 24 hour  Intake   4790 ml  Output   1395 ml  Net   3395 ml   Filed Weights   05/21/15 1600 05/23/15 0545 05/23/15 1700  Weight: 95.057 kg (209 lb 9 oz) 93.9 kg (207 lb 0.2 oz) 96 kg (211 lb 10.3 oz)    Exam:   General:  Awake, sitting in bed, eating ice cream, in nad  Cardiovascular: regular, s1, s2  Respiratory: normal resp effort, no wheezing  Abdomen: soft,obese, pos BS, mild post-op pain   Musculoskeletal: perfused, no clubbing   Data Reviewed: Basic Metabolic Panel:  Recent Labs Lab 05/21/15 1905 05/22/15 0502 05/23/15 0450  NA 136 135 134*  K 4.1 3.7 3.7  CL 106 106 105  CO2 24 21* 22  GLUCOSE 204* 120* 97  BUN 20 18 19   CREATININE 1.30*  1.30* 1.27* 1.56*  CALCIUM 8.9 8.6* 8.7*   Liver Function Tests:  Recent Labs Lab 05/21/15 1018 05/21/15 1905 05/22/15 0502 05/23/15 0450  AST 60*  57* 35 22  ALT 32 42 32 25  ALKPHOS 32* 32* 28* 28*  BILITOT 0.8 0.4 1.1 1.7*  PROT 6.5 7.0 6.0* 6.1*  ALBUMIN 3.9 3.7 3.4* 3.2*    Recent Labs Lab 05/21/15 1018  LIPASE 31.0   No results for input(s): AMMONIA in the last 168 hours. CBC:  Recent Labs Lab 05/21/15 1018 05/21/15 1905 05/22/15 0502 05/23/15 0450  WBC 9.0 13.7* 15.1* 15.6*  NEUTROABS 7.2  --   --   --   HGB 11.3* 10.8* 10.4* 10.8*  HCT 34.0* 33.6* 32.6* 32.2*  MCV 94.8 95.5 95.0 95.0  PLT 185.0 171 166 154   Cardiac Enzymes:  Recent Labs Lab 05/23/15 1615  CKTOTAL 36*  CKMB 15.5*  TROPONINI 0.03   BNP (last 3 results) No results for input(s): BNP in the  last 8760 hours.  ProBNP (last 3 results)  Recent Labs  06/27/14 1954 07/04/14 1300  PROBNP 333.0 397.2    CBG:  Recent Labs Lab 05/23/15 1128 05/23/15 1356 05/23/15 1558 05/23/15 1747 05/24/15 0025  GLUCAP 88 81 97 119* 197*    Recent Results (from the past 240 hour(s))  Culture, blood (routine x 2)     Status: None (Preliminary result)   Collection Time: 05/21/15  7:05 PM  Result Value Ref Range Status   Specimen Description BLOOD LEFT ARM  Final   Special Requests BOTTLES DRAWN AEROBIC AND ANAEROBIC 5CC  Final   Culture   Final    NO GROWTH 2 DAYS Performed at [facility]    Report Status PENDING  Incomplete  Culture, blood (routine x 2)     Status: None (Preliminary result)   Collection Time: 05/21/15  7:14 PM  Result Value Ref Range Status   Specimen Description BLOOD LEFT ARM  Final   Special Requests BOTTLES DRAWN AEROBIC AND ANAEROBIC 10CC  Final   Culture   Final    NO GROWTH 2 DAYS Performed at [facility]    Report Status PENDING  Incomplete  Surgical pcr screen     Status: Abnormal   Collection Time: 05/23/15  7:53 AM  Result Value Ref Range Status   MRSA, PCR NEGATIVE NEGATIVE Final   Staphylococcus aureus POSITIVE (A) NEGATIVE Final    Comment:        The Xpert SA Assay (FDA approved for NASAL specimens in patients over 27 years of age), is one component of a comprehensive surveillance program.  Test performance has been validated by [facility] for patients greater than or equal to 81 year old. It is not intended to diagnose infection nor to guide or monitor treatment.      Studies: Dg Cholangiogram Operative  05/23/2015   CLINICAL DATA:  Cholecystectomy for cholecystitis. History of biliary stent placement.  EXAM: INTRAOPERATIVE CHOLANGIOGRAM  TECHNIQUE: Cholangiographic images from the C-arm fluoroscopic device were submitted for interpretation post-operatively. Please see the procedural report for the amount of  contrast and the fluoroscopy time utilized.  COMPARISON:  CT and ultrasound studies on 05/21/2015  FINDINGS: Intraoperative cholangiogram shows injected contrast entering a metallic stent within the common bile duct. There is flow of contrast into the duodenum with some irregular filling defects in the stent likely related to sludge and/or calculi. There may be some subtle filling defects in the duct superior to the stent. No contrast extravasation identified.  IMPRESSION: Open metallic common bile duct stent with some probable sludge and/or calculi within the stent lumen.   Electronically Signed  By: Aletta Edouard M.D.   On: 05/23/2015 15:12    Scheduled Meds: . amLODipine  5 mg Oral Daily  . aspirin EC  81 mg Oral Daily  . clopidogrel  75 mg Oral Daily  . ezetimibe  10 mg Oral Daily  . ferrous sulfate  325 mg Oral Q breakfast  . folic acid  1 mg Oral Daily  . heparin  5,000 Units Subcutaneous 3 times per day  . insulin aspart  0-15 Units Subcutaneous TID WC  . insulin aspart  0-5 Units Subcutaneous QHS  . LORazepam  0-4 mg Oral Q12H  . losartan  50 mg Oral Daily  . metoprolol succinate  25 mg Oral Daily  . multivitamin with minerals  1 tablet Oral Daily  . pantoprazole  40 mg Oral Daily  . piperacillin-tazobactam (ZOSYN)  IV  3.375 g Intravenous 3 times per day  . sodium chloride  3 mL Intravenous Q12H  . thiamine  100 mg Oral Daily   Or  . thiamine  100 mg Intravenous Daily   Continuous Infusions: . sodium chloride 0.45 % 1,000 mL with potassium chloride 20 mEq infusion 75 mL/hr at 05/24/15 1253    Principal Problem:   Biliary obstruction Active Problems:   Type II diabetes mellitus with complication -- CAD, AAA   Hypertension associated with diabetes   Hyperlipidemia LDL goal <70; statin intolerant   Chronic diastolic CHF (congestive heart failure)   S/P CABG x 2: 1997. SVG-OM (known to be occluded), LIMA-LAD   Cholelithiasis with cholecystitis   AAA (abdominal aortic  aneurysm)    Oliana Gowens, [facility] Pager [phone]. If 7PM-7AM, please contact night-coverage at [email or web address], password [facility] 05/24/2015, 2:38 PM  LOS: 3 days

## 2015-05-25 LAB — CBC
HCT: 27.8 % — ABNORMAL LOW (ref 39.0–52.0)
Hemoglobin: 8.8 g/dL — ABNORMAL LOW (ref 13.0–17.0)
MCH: 30 pg (ref 26.0–34.0)
MCHC: 31.7 g/dL (ref 30.0–36.0)
MCV: 94.9 fL (ref 78.0–100.0)
PLATELETS: 146 10*3/uL — AB (ref 150–400)
RBC: 2.93 MIL/uL — AB (ref 4.22–5.81)
RDW: 14.5 % (ref 11.5–15.5)
WBC: 10 10*3/uL (ref 4.0–10.5)

## 2015-05-25 LAB — GLUCOSE, CAPILLARY
GLUCOSE-CAPILLARY: 123 mg/dL — AB (ref 65–99)
GLUCOSE-CAPILLARY: 155 mg/dL — AB (ref 65–99)
Glucose-Capillary: 119 mg/dL — ABNORMAL HIGH (ref 65–99)
Glucose-Capillary: 131 mg/dL — ABNORMAL HIGH (ref 65–99)

## 2015-05-25 LAB — COMPREHENSIVE METABOLIC PANEL
ALK PHOS: 26 U/L — AB (ref 38–126)
ALT: 45 U/L (ref 17–63)
ANION GAP: 6 (ref 5–15)
AST: 39 U/L (ref 15–41)
Albumin: 2.5 g/dL — ABNORMAL LOW (ref 3.5–5.0)
BILIRUBIN TOTAL: 0.7 mg/dL (ref 0.3–1.2)
BUN: 27 mg/dL — ABNORMAL HIGH (ref 6–20)
CALCIUM: 8.6 mg/dL — AB (ref 8.9–10.3)
CO2: 23 mmol/L (ref 22–32)
Chloride: 108 mmol/L (ref 101–111)
Creatinine, Ser: 1.23 mg/dL (ref 0.61–1.24)
GFR calc Af Amer: >60 mL/min (ref >60)
GFR, EST NON AFRICAN AMERICAN: 55 mL/min — AB (ref >60)
Glucose, Bld: 135 mg/dL — ABNORMAL HIGH (ref 65–99)
POTASSIUM: 4.3 mmol/L (ref 3.5–5.1)
Sodium: 137 mmol/L (ref 135–145)
TOTAL PROTEIN: 5.6 g/dL — AB (ref 6.5–8.1)

## 2015-05-25 MED ORDER — METOPROLOL SUCCINATE ER 25 MG PO TB24
25.0000 mg | ORAL_TABLET | Freq: Every day | ORAL | Status: DC
  Administered 2015-05-26: 25 mg via ORAL
  Filled 2015-05-25 (×2): qty 1

## 2015-05-25 NOTE — Progress Notes (Signed)
[facility] PROGRESS NOTE  [name] PZW:[id number] DOB: 14-Dec-1935 DOA: 05/21/2015 PCP: [name], MD  Assessment/Plan: 1. Biliary obstruction secondary to Gangrenous Cholecystitis 1. Presented from GI clinic with worsening abd pain with prior stenting x 2. Abd [place] with evidence of obstruction with 11mm CBD. CT abd with evidence of GB wall thickening and possible stone obstruction 2. Initially held aspirin and plavix for surgery. 3. Pt now s/p cholecystectomy 8/19 per Surgery  4. GI plans of ERCP with stent removal later as outpatient 5. Cardiology has since resumed ASA and plavix as of 8/20 6. Advancing diet 2. DM2 1. Will continue on SSI coverage for now 2. Recent a1c from 02/06/15 noted to be 6.2% 3. Glucose remains stable 3. CAD s/p CABG 1. Remains chest pain free 2. Cardiology following 3. Plavix and ASA resumed 8/20 4. HTN 1. BP remains stable 2. Will cont home meds 5. HLD 1. On zetia prior to admit 6. Chronic diastolic CHF 1. Most recent echo in 2013 with normal LVEF 2. Clinically euvolemic 7. Daily ETOH use 1. Reports drinking 4-5 beers daily, last use was on 8/16 2. On CIWA 3. Stable thus far 8. AAA 1. Abd US demonstrated a 4.9cm lower AAA with recs for follow up CTA abd/pelvis in 6 months 2. Discussed case with on-call Vascular Surgeon, Dr Kellie Simmering who recommended CT abd/pelvis to r/o AAA leak. If neg, recommendation to follow as outpatient with Vascular Surgery in 6 months 3. CT re-demonstrates 4.9cm AAA without mention of leak 9. DVT prophylaxis 1. Heparin subq while inpatient  Code Status: Full Family Communication: Pt in room Disposition Plan: Pending   Consultants:  GI  General Surgery  Cardiology  Discussed case with Vascular Surgery over phone  Procedures:  Laparoscopic Cholecystectomy 8/19  Antibiotics:  Unasyn 8/17>>>8/18  Zosyn 8/18>>>   HPI/Subjective: Reports feeling better today. Reports only mild pos-op  pain.   Objective: Filed Vitals:   05/24/15 1650 05/24/15 1930 05/25/15 0501 05/25/15 1346  BP: 112/55 125/59 125/66 109/54  Pulse: 59 55 51 57  Temp: 97.5 F (36.4 C) 97.6 F (36.4 C) 97.4 F (36.3 C) 97.5 F (36.4 C)  TempSrc: Oral Oral Oral Oral  Resp: 16 16 14 17   Height:      Weight:   96.2 kg (212 lb 1.3 oz)   SpO2: 98% 98% 96% 98%    Intake/Output Summary (Last 24 hours) at 05/25/15 1647 Last data filed at 05/25/15 1600  Gross per 24 hour  Intake   1873 ml  Output   1712 ml  Net    161 ml   Filed Weights   05/23/15 0545 05/23/15 1700 05/25/15 0501  Weight: 93.9 kg (207 lb 0.2 oz) 96 kg (211 lb 10.3 oz) 96.2 kg (212 lb 1.3 oz)    Exam:   General:  Awake, sitting in bed, in nad  Cardiovascular: regular, s1, s2  Respiratory: normal resp effort, no wheezing  Abdomen: soft,obese, pos BS, mild post-op pain   Musculoskeletal: perfused, no clubbing, no cyanosis  Data Reviewed: Basic Metabolic Panel:  Recent Labs Lab 05/21/15 1905 05/22/15 0502 05/23/15 0450 05/25/15 0523  NA 136 135 134* 137  K 4.1 3.7 3.7 4.3  CL 106 106 105 108  CO2 24 21* 22 23  GLUCOSE 204* 120* 97 135*  BUN 20 18 19  27*  CREATININE 1.30*  1.30* 1.27* 1.56* 1.23  CALCIUM 8.9 8.6* 8.7* 8.6*   Liver Function Tests:  Recent Labs Lab 05/21/15 1018 05/21/15  1905 05/22/15 0502 05/23/15 0450 05/25/15 0523  AST 60* 57* 35 22 39  ALT 32 42 32 25 45  ALKPHOS 32* 32* 28* 28* 26*  BILITOT 0.8 0.4 1.1 1.7* 0.7  PROT 6.5 7.0 6.0* 6.1* 5.6*  ALBUMIN 3.9 3.7 3.4* 3.2* 2.5*    Recent Labs Lab 05/21/15 1018  LIPASE 31.0   No results for input(s): AMMONIA in the last 168 hours. CBC:  Recent Labs Lab 05/21/15 1018 05/21/15 1905 05/22/15 0502 05/23/15 0450 05/25/15 0523  WBC 9.0 13.7* 15.1* 15.6* 10.0  NEUTROABS 7.2  --   --   --   --   HGB 11.3* 10.8* 10.4* 10.8* 8.8*  HCT 34.0* 33.6* 32.6* 32.2* 27.8*  MCV 94.8 95.5 95.0 95.0 94.9  PLT 185.0 171 166 154 146*    Cardiac Enzymes:  Recent Labs Lab 05/23/15 1615  CKTOTAL 36*  CKMB 15.5*  TROPONINI 0.03   BNP (last 3 results) No results for input(s): BNP in the last 8760 hours.  ProBNP (last 3 results)  Recent Labs  06/27/14 1954 07/04/14 1300  PROBNP 333.0 397.2    CBG:  Recent Labs Lab 05/24/15 1704 05/24/15 2115 05/25/15 0645 05/25/15 0735 05/25/15 1206  GLUCAP 131* 143* 119* 131* 123*    Recent Results (from the past 240 hour(s))  Culture, blood (routine x 2)     Status: None (Preliminary result)   Collection Time: 05/21/15  7:05 PM  Result Value Ref Range Status   Specimen Description BLOOD LEFT ARM  Final   Special Requests BOTTLES DRAWN AEROBIC AND ANAEROBIC 5CC  Final   Culture   Final    NO GROWTH 3 DAYS Performed at [facility]    Report Status PENDING  Incomplete  Culture, blood (routine x 2)     Status: None (Preliminary result)   Collection Time: 05/21/15  7:14 PM  Result Value Ref Range Status   Specimen Description BLOOD LEFT ARM  Final   Special Requests BOTTLES DRAWN AEROBIC AND ANAEROBIC 10CC  Final   Culture   Final    NO GROWTH 3 DAYS Performed at [facility]    Report Status PENDING  Incomplete  Surgical pcr screen     Status: Abnormal   Collection Time: 05/23/15  7:53 AM  Result Value Ref Range Status   MRSA, PCR NEGATIVE NEGATIVE Final   Staphylococcus aureus POSITIVE (A) NEGATIVE Final    Comment:        The Xpert SA Assay (FDA approved for NASAL specimens in patients over 63 years of age), is one component of a comprehensive surveillance program.  Test performance has been validated by [facility] for patients greater than or equal to 85 year old. It is not intended to diagnose infection nor to guide or monitor treatment.      Studies: No results found.  Scheduled Meds: . amLODipine  5 mg Oral Daily  . aspirin EC  81 mg Oral Daily  . clopidogrel  75 mg Oral Daily  . ezetimibe  10 mg Oral Daily  .  ferrous sulfate  325 mg Oral Q breakfast  . folic acid  1 mg Oral Daily  . heparin  5,000 Units Subcutaneous 3 times per day  . insulin aspart  0-15 Units Subcutaneous TID WC  . insulin aspart  0-5 Units Subcutaneous QHS  . LORazepam  0-4 mg Oral Q12H  . losartan  50 mg Oral Daily  . metoprolol succinate  25 mg Oral Daily  .  multivitamin with minerals  1 tablet Oral Daily  . pantoprazole  40 mg Oral Daily  . piperacillin-tazobactam (ZOSYN)  IV  3.375 g Intravenous 3 times per day  . sodium chloride  3 mL Intravenous Q12H  . thiamine  100 mg Oral Daily   Or  . thiamine  100 mg Intravenous Daily   Continuous Infusions: . sodium chloride 0.45 % 1,000 mL with potassium chloride 20 mEq infusion 75 mL/hr at 05/25/15 0100    Principal Problem:   Biliary obstruction Active Problems:   Type II diabetes mellitus with complication -- CAD, AAA   Hypertension associated with diabetes   Hyperlipidemia LDL goal <70; statin intolerant   Chronic diastolic CHF (congestive heart failure)   S/P CABG x 2: 1997. SVG-OM (known to be occluded), LIMA-LAD   Cholelithiasis with cholecystitis   AAA (abdominal aortic aneurysm)    Korie Brabson, [facility] Pager [phone]. If 7PM-7AM, please contact night-coverage at [email or web address], password [email or web address] 05/25/2015, 4:47 PM  LOS: 4 days

## 2015-05-25 NOTE — Progress Notes (Signed)
Patient ID: [name], male   DOB: 1936-07-26, 79 y.o.   MRN: [id number]    Subjective:  Denies SSCP, palpitations or Dyspnea Post lapcholy only liquids PO.    Objective:  Filed Vitals:   05/24/15 1250 05/24/15 1650 05/24/15 1930 05/25/15 0501  BP: 102/53 112/55 125/59 125/66  Pulse: 53 59 55 51  Temp:  97.5 F (36.4 C) 97.6 F (36.4 C) 97.4 F (36.3 C)  TempSrc:  Oral Oral Oral  Resp: 16 16 16 14   Height:      Weight:    96.2 kg (212 lb 1.3 oz)  SpO2: 97% 98% 98% 96%    Intake/Output from previous day:  Intake/Output Summary (Last 24 hours) at 05/25/15 0726 Last data filed at 05/25/15 7846  Gross per 24 hour  Intake 3168.75 ml  Output   1580 ml  Net 1588.75 ml    Physical Exam: Affect appropriate Elderly male taking PO HEENT: normal Neck supple with no adenopathy JVP normal no bruits no thyromegaly Lungs clear with no wheezing and good diaphragmatic motion Heart:  S1/S2 no murmur, no rub, gallop or click PMI normal Abdomen: BS positive post lapcholy with drain in place still  no bruit.  No HSM or HJR Distal pulses intact with no bruits No edema Neuro non-focal Skin warm and dry No muscular weakness   Lab Results: Basic Metabolic Panel:  Recent Labs  05/23/15 0450 05/25/15 0523  NA 134* 137  K 3.7 4.3  CL 105 108  CO2 22 23  GLUCOSE 97 135*  BUN 19 27*  CREATININE 1.56* 1.23  CALCIUM 8.7* 8.6*   Liver Function Tests:  Recent Labs  05/23/15 0450 05/25/15 0523  AST 22 39  ALT 25 45  ALKPHOS 28* 26*  BILITOT 1.7* 0.7  PROT 6.1* 5.6*  ALBUMIN 3.2* 2.5*   No results for input(s): LIPASE, AMYLASE in the last 72 hours. CBC:  Recent Labs  05/23/15 0450 05/25/15 0523  WBC 15.6* 10.0  HGB 10.8* 8.8*  HCT 32.2* 27.8*  MCV 95.0 94.9  PLT 154 146*   Cardiac Enzymes:  Recent Labs  05/23/15 1615  CKTOTAL 36*  CKMB 15.5*  TROPONINI 0.03    Imaging: Dg Cholangiogram Operative  05/23/2015   CLINICAL DATA:  Cholecystectomy for  cholecystitis. History of biliary stent placement.  EXAM: INTRAOPERATIVE CHOLANGIOGRAM  TECHNIQUE: Cholangiographic images from the C-arm fluoroscopic device were submitted for interpretation post-operatively. Please see the procedural report for the amount of contrast and the fluoroscopy time utilized.  COMPARISON:  CT and ultrasound studies on 05/21/2015  FINDINGS: Intraoperative cholangiogram shows injected contrast entering a metallic stent within the common bile duct. There is flow of contrast into the duodenum with some irregular filling defects in the stent likely related to sludge and/or calculi. There may be some subtle filling defects in the duct superior to the stent. No contrast extravasation identified.  IMPRESSION: Open metallic common bile duct stent with some probable sludge and/or calculi within the stent lumen.   Electronically Signed   By: Aletta Edouard M.D.   On: 05/23/2015 15:12    Cardiac Studies:  ECG: SR no acute changes no AV block 8/20   Telemetry:  Bradycardic rate 47    Echo:   EF 550-60% 2013    Medications:   . amLODipine  5 mg Oral Daily  . aspirin EC  81 mg Oral Daily  . clopidogrel  75 mg Oral Daily  . ezetimibe  10 mg Oral Daily  .  ferrous sulfate  325 mg Oral Q breakfast  . folic acid  1 mg Oral Daily  . heparin  5,000 Units Subcutaneous 3 times per day  . insulin aspart  0-15 Units Subcutaneous TID WC  . insulin aspart  0-5 Units Subcutaneous QHS  . LORazepam  0-4 mg Oral Q12H  . losartan  50 mg Oral Daily  . metoprolol succinate  25 mg Oral Daily  . multivitamin with minerals  1 tablet Oral Daily  . pantoprazole  40 mg Oral Daily  . piperacillin-tazobactam (ZOSYN)  IV  3.375 g Intravenous 3 times per day  . sodium chloride  3 mL Intravenous Q12H  . thiamine  100 mg Oral Daily   Or  . thiamine  100 mg Intravenous Daily     . sodium chloride 0.45 % 1,000 mL with potassium chloride 20 mEq infusion 75 mL/hr at 05/25/15 0100    Assessment/Plan:    CAD:  Post CABG with stents to SVG RCA/OM a year ago  Plavix and Aspirin resumed yesterday  No chest pain troponin negative  Repeat ECG Telemetry bradycardia  Hold beta blocker .  HTN:  On ARB  GB: post lap choly inferior ECG changes may be due to GB inflammation as he has no chest pain and troponin negative.  Wounds look good And will need biliary stents removed ? 2-6 weeks   Jenkins Rouge 05/25/2015, 7:26 AM

## 2015-05-25 NOTE — Progress Notes (Signed)
Patient ID: [name], male   DOB: [birth year], 79 y.o.   MRN: [id number] [facility] Progress Note:   2 Days Post-Op  Subjective: Mental status is clear.  Moved to telemetry yesterday.  Getting up walking Objective: Vital signs in last 24 hours: Temp:  [97.4 F (36.3 C)-97.6 F (36.4 C)] 97.4 F (36.3 C) (08/21 0501) Pulse Rate:  [51-59] 51 (08/21 0501) Resp:  [14-16] 14 (08/21 0501) BP: (102-138)/(53-70) 125/66 mmHg (08/21 0501) SpO2:  [96 %-100 %] 96 % (08/21 0501) Weight:  [96.2 kg (212 lb 1.3 oz)] 96.2 kg (212 lb 1.3 oz) (08/21 0501)  Intake/Output from previous day: 08/20 0701 - 08/21 0700 In: 3925 [P.O.:1200; I.V.:2575; IV Piggyback:150] Out: 1600 [Urine:1575; Drains:25] Intake/Output this shift:    Physical Exam: Work of breathing is normal.  JP serosanguinous  Lab Results:  Results for orders placed or performed during the hospital encounter of 05/21/15 (from the past 48 hour(s))  Glucose, capillary     Status: None   Collection Time: 05/23/15 11:28 AM  Result Value Ref Range   Glucose-Capillary 88 65 - 99 mg/dL  Glucose, capillary     Status: None   Collection Time: 05/23/15  1:56 PM  Result Value Ref Range   Glucose-Capillary 81 65 - 99 mg/dL   Comment 1 Document in Chart   Glucose, capillary     Status: None   Collection Time: 05/23/15  3:58 PM  Result Value Ref Range   Glucose-Capillary 97 65 - 99 mg/dL  Troponin I (q 6hr x 3)     Status: None   Collection Time: 05/23/15  4:15 PM  Result Value Ref Range   Troponin I 0.03 <0.031 ng/mL  CK total and CKMB (cardiac)not at [facility]     Status: Abnormal   Collection Time: 05/23/15  4:15 PM  Result Value Ref Range   Total CK 36 (L) 49 - 397 U/L   CK, MB 15.5 (H) 0.5 - 5.0 ng/mL   Relative Index RELATIVE INDEX IS INVALID 0.0 - 2.5    Comment: WHEN CK < 100 U/L        Performed at [facility]   Glucose, capillary     Status: Abnormal   Collection Time: 05/23/15  5:47 PM  Result Value Ref  Range   Glucose-Capillary 119 (H) 65 - 99 mg/dL  Glucose, capillary     Status: Abnormal   Collection Time: 05/24/15 12:25 AM  Result Value Ref Range   Glucose-Capillary 197 (H) 65 - 99 mg/dL  Glucose, capillary     Status: Abnormal   Collection Time: 05/24/15  7:56 AM  Result Value Ref Range   Glucose-Capillary 155 (H) 65 - 99 mg/dL  Glucose, capillary     Status: Abnormal   Collection Time: 05/24/15 11:38 AM  Result Value Ref Range   Glucose-Capillary 141 (H) 65 - 99 mg/dL  Glucose, capillary     Status: Abnormal   Collection Time: 05/24/15  5:04 PM  Result Value Ref Range   Glucose-Capillary 131 (H) 65 - 99 mg/dL  Glucose, capillary     Status: Abnormal   Collection Time: 05/24/15  9:15 PM  Result Value Ref Range   Glucose-Capillary 143 (H) 65 - 99 mg/dL  CBC     Status: Abnormal   Collection Time: 05/25/15  5:23 AM  Result Value Ref Range   WBC 10.0 4.0 - 10.5 K/uL   RBC 2.93 (L) 4.22 - 5.81 MIL/uL   Hemoglobin  8.8 (L) 13.0 - 17.0 g/dL   HCT 27.8 (L) 39.0 - 52.0 %   MCV 94.9 78.0 - 100.0 fL   MCH 30.0 26.0 - 34.0 pg   MCHC 31.7 30.0 - 36.0 g/dL   RDW 14.5 11.5 - 15.5 %   Platelets 146 (L) 150 - 400 K/uL  Comprehensive metabolic panel     Status: Abnormal   Collection Time: 05/25/15  5:23 AM  Result Value Ref Range   Sodium 137 135 - 145 mmol/L   Potassium 4.3 3.5 - 5.1 mmol/L   Chloride 108 101 - 111 mmol/L   CO2 23 22 - 32 mmol/L   Glucose, Bld 135 (H) 65 - 99 mg/dL   BUN 27 (H) 6 - 20 mg/dL   Creatinine, Ser 1.23 0.61 - 1.24 mg/dL   Calcium 8.6 (L) 8.9 - 10.3 mg/dL   Total Protein 5.6 (L) 6.5 - 8.1 g/dL   Albumin 2.5 (L) 3.5 - 5.0 g/dL   AST 39 15 - 41 U/L   ALT 45 17 - 63 U/L   Alkaline Phosphatase 26 (L) 38 - 126 U/L   Total Bilirubin 0.7 0.3 - 1.2 mg/dL   GFR calc non Af Amer 55 (L) >60 mL/min   GFR calc Af Amer >60 >60 mL/min    Comment: (NOTE) The eGFR has been calculated using the CKD EPI equation. This calculation has not been validated in all  clinical situations. eGFR's persistently <60 mL/min signify possible Chronic Kidney Disease.    Anion gap 6 5 - 15  Glucose, capillary     Status: Abnormal   Collection Time: 05/25/15  6:45 AM  Result Value Ref Range   Glucose-Capillary 119 (H) 65 - 99 mg/dL  Glucose, capillary     Status: Abnormal   Collection Time: 05/25/15  7:35 AM  Result Value Ref Range   Glucose-Capillary 131 (H) 65 - 99 mg/dL    Radiology/Results: Dg Cholangiogram Operative  05/23/2015   CLINICAL DATA:  Cholecystectomy for cholecystitis. History of biliary stent placement.  EXAM: INTRAOPERATIVE CHOLANGIOGRAM  TECHNIQUE: Cholangiographic images from the C-arm fluoroscopic device were submitted for interpretation post-operatively. Please see the procedural report for the amount of contrast and the fluoroscopy time utilized.  COMPARISON:  CT and ultrasound studies on 05/21/2015  FINDINGS: Intraoperative cholangiogram shows injected contrast entering a metallic stent within the common bile duct. There is flow of contrast into the duodenum with some irregular filling defects in the stent likely related to sludge and/or calculi. There may be some subtle filling defects in the duct superior to the stent. No contrast extravasation identified.  IMPRESSION: Open metallic common bile duct stent with some probable sludge and/or calculi within the stent lumen.   Electronically Signed   By: Aletta Edouard M.D.   On: 05/23/2015 15:12    Anti-infectives: Anti-infectives    Start     Dose/Rate Route Frequency Ordered Stop   05/22/15 1200  piperacillin-tazobactam (ZOSYN) IVPB 3.375 g     3.375 g 12.5 mL/hr over 240 Minutes Intravenous 3 times per day 05/22/15 1019     05/22/15 0000  Ampicillin-Sulbactam (UNASYN) 3 g in sodium chloride 0.9 % 100 mL IVPB  Status:  Discontinued     3 g 100 mL/hr over 60 Minutes Intravenous Every 6 hours 05/21/15 2124 05/22/15 1019   05/21/15 1745  Ampicillin-Sulbactam (UNASYN) 3 g in sodium chloride  0.9 % 100 mL IVPB     3 g 100 mL/hr over 60 Minutes Intravenous  Once 05/21/15 1731 05/21/15 1921      Assessment/Plan: Problem List: Patient Active Problem List   Diagnosis Date Noted  . AAA (abdominal aortic aneurysm)   . Cholelithiasis with cholecystitis 05/22/2015  . History of unstable angina 01/08/2015  . Presence of drug coated stent in right coronary artery and circumflex coronary artery 01/08/2015  . Medication management 01/08/2015  . Biliary obstruction 08/02/2014  . Calculus of bile duct 07/03/2014  . Chronic renal insufficiency, stage III (moderate) 06/06/2014  . Coronary stent restenosis with uncertain cause: Required PCI for ISR of OM1 (Promus P 2.75 mm x 12 mm) & pRCA 05/17/2014  . Obesity (BMI 30-39.9) 06/28/2013  . Encounter for long-term (current) use of other medications 06/28/2013  . AAA (abdominal aortic aneurysm) without rupture 04/16/2013  . ACE-inhibitor cough 04/16/2013  . Chronic diastolic CHF (congestive heart failure) 11/24/2012  . Type II diabetes mellitus with complication -- CAD, AAA 03/04/2011    Class: Diagnosis of  . Hypertension associated with diabetes 03/04/2011  . Hyperlipidemia LDL goal <70; statin intolerant 03/04/2011    Class: Diagnosis of  . ED (erectile dysfunction) of organic origin 03/04/2011  . GERD (gastroesophageal reflux disease) 03/04/2011  . S/P CABG x 2: 1997. SVG-OM (known to be occluded), LIMA-LAD 06/28/1996  . History of ST elevation myocardial infarction (STEMI) of inferolateral wall (PTCA - 100% large lateral OM) 06/28/1996    Class: History of  . CAD S/P CABG '95- several PCis since, last 05/17/14 06/28/1994    Class: History of    Doing fairly well.  No pain.  Will advance diet to solid carb modified.   2 Days Post-Op    LOS: 4 days   Matt B. Hassell Done, MD, [facility] [phone] beeper [phone]  05/25/2015 9:04 AM

## 2015-05-25 NOTE — Progress Notes (Signed)
Patient ambulated approximately 190 feet.

## 2015-05-26 ENCOUNTER — Telehealth: Payer: Self-pay

## 2015-05-26 ENCOUNTER — Encounter (HOSPITAL_COMMUNITY): Payer: Self-pay | Admitting: Surgery

## 2015-05-26 DIAGNOSIS — K8010 Calculus of gallbladder with chronic cholecystitis without obstruction: Secondary | ICD-10-CM

## 2015-05-26 DIAGNOSIS — I10 Essential (primary) hypertension: Secondary | ICD-10-CM

## 2015-05-26 DIAGNOSIS — E785 Hyperlipidemia, unspecified: Secondary | ICD-10-CM

## 2015-05-26 DIAGNOSIS — E1169 Type 2 diabetes mellitus with other specified complication: Secondary | ICD-10-CM

## 2015-05-26 LAB — COMPREHENSIVE METABOLIC PANEL
ALT: 52 U/L (ref 17–63)
AST: 41 U/L (ref 15–41)
Albumin: 2.7 g/dL — ABNORMAL LOW (ref 3.5–5.0)
Alkaline Phosphatase: 30 U/L — ABNORMAL LOW (ref 38–126)
Anion gap: 6 (ref 5–15)
BUN: 22 mg/dL — ABNORMAL HIGH (ref 6–20)
CHLORIDE: 106 mmol/L (ref 101–111)
CO2: 23 mmol/L (ref 22–32)
CREATININE: 1.28 mg/dL — AB (ref 0.61–1.24)
Calcium: 8.7 mg/dL — ABNORMAL LOW (ref 8.9–10.3)
GFR, EST NON AFRICAN AMERICAN: 52 mL/min — AB (ref >60)
Glucose, Bld: 155 mg/dL — ABNORMAL HIGH (ref 65–99)
POTASSIUM: 4 mmol/L (ref 3.5–5.1)
SODIUM: 135 mmol/L (ref 135–145)
Total Bilirubin: 0.7 mg/dL (ref 0.3–1.2)
Total Protein: 6.1 g/dL — ABNORMAL LOW (ref 6.5–8.1)

## 2015-05-26 LAB — CULTURE, BLOOD (ROUTINE X 2)
CULTURE: NO GROWTH
CULTURE: NO GROWTH

## 2015-05-26 LAB — GLUCOSE, CAPILLARY
GLUCOSE-CAPILLARY: 125 mg/dL — AB (ref 65–99)
GLUCOSE-CAPILLARY: 123 mg/dL — AB (ref 65–99)

## 2015-05-26 MED ORDER — BISACODYL 10 MG RE SUPP: 10.0000 mg | Freq: Every day | RECTAL | Status: DC | PRN

## 2015-05-26 MED ORDER — METOPROLOL SUCCINATE ER 25 MG PO TB24: 12.5000 mg | ORAL_TABLET | Freq: Every day | ORAL | Status: DC

## 2015-05-26 MED ORDER — OXYCODONE HCL 5 MG PO TABS: 5.0000 mg | ORAL_TABLET | Freq: Four times a day (QID) | ORAL | Status: DC | PRN

## 2015-05-26 MED ORDER — AMOXICILLIN-POT CLAVULANATE 875-125 MG PO TABS: 1.0000 | ORAL_TABLET | Freq: Two times a day (BID) | ORAL | Status: DC

## 2015-05-26 MED ORDER — METOPROLOL SUCCINATE ER 25 MG PO TB24
12.5000 mg | ORAL_TABLET | Freq: Every day | ORAL | Status: DC
Start: 2015-05-27 — End: 2015-05-26

## 2015-05-26 NOTE — Discharge Summary (Addendum)
Physician Discharge Summary  [name] AYT:[id number] DOB: [birth year] DOA: 05/21/2015  PCP: [name], MD  Admit date: 05/21/2015 Discharge date: 05/26/2015  Time spent: 20 minutes  Recommendations for Outpatient Follow-up:  1. Follow up with General Surgery in 1-2 weeks 2. Follow up with Cardiology in 2 weeks 3. Follow up with PCP in 1-2 weeks 4. Recommend re-imaging of AAA in 6 months with follow up with Vascular surgery at that time. Case was discussed with Dr. Kellie Simmering during this admission  Discharge Diagnoses:  Principal Problem:   Biliary obstruction Active Problems:   Type II diabetes mellitus with complication -- CAD, AAA   Hypertension associated with diabetes   Hyperlipidemia LDL goal <70; statin intolerant   Chronic diastolic CHF (congestive heart failure)   S/P CABG x 2: 1997. SVG-OM (known to be occluded), LIMA-LAD   Cholelithiasis with cholecystitis   AAA (abdominal aortic aneurysm)   Discharge Condition: Improved  Diet recommendation: Heart healthy, diabetic  Filed Weights   05/23/15 1700 05/25/15 0501 05/26/15 0452  Weight: 96 kg (211 lb 10.3 oz) 96.2 kg (212 lb 1.3 oz) 96.4 kg (212 lb 8.4 oz)    History of present illness:  Please see admit h and p from 8/17 for details. Briefly, pt presented from GI clinic with increasing abd pains in the setting of known biliary obstruction s/p prior stenting. The patient was admitted for further work up.  Hospital Course:   Biliary obstruction secondary to Gangrenous Cholecystitis  Presented from GI clinic with worsening abd pain with prior stenting x 2. Abd [place] with evidence of obstruction with 75m CBD. CT abd with evidence of GB wall thickening and possible stone obstruction  Initially held aspirin and plavix for surgery.  Pt now s/p cholecystectomy 8/19 per Surgery   GI plans of ERCP with stent removal later as outpatient  Cardiology has since resumed ASA and plavix as of 8/20  Beta blocker  was stopped as of 8/22 secondary to bradycardia to the 50's  DM2  Will continue on SSI coverage for now  Recent a1c from 02/06/15 noted to be 6.2%  Glucose remains stable  CAD s/p CABG  Remains chest pain free  Cardiology had been following  Plavix and ASA resumed 8/20  Beta blocker was held as of 8/22 secondary to bradycardia  HTN  BP remains stable  Will cont home meds  HLD  On zetia prior to admit  Chronic diastolic CHF  Most recent echo in 2013 with normal LVEF  Clinically euvolemic  Daily ETOH use  Reports drinking 4-5 beers daily, last use was on 8/16  On CIWA  Stable thus far  AAA  Abd [place]oreademonstrated a 4.9cm lower AAA with recs for follow up CTA abd/pelvis in 6 months  Discussed case with on-call Vascular Surgeon, Dr LKellie Simmeringwho recommended CT abd/pelvis to r/o AAA leak. If neg, recommendation to follow as outpatient with Vascular Surgery in 6 months  CT re-demonstrates 4.9cm AAA without mention of leak  Will have pt follow up with Vascular Surgery in 6 months as recommended  DVT prophylaxis  Heparin subq while inpatient  Procedures:  Laparoscopic Cholecystectomy 8/19  Consultations:  GI  General Surgery  Cardiology  Case discussed with Dr. LKellie Simmering(Vascular Surgery) over phone  Discharge Exam: Filed Vitals:   05/25/15 1346 05/25/15 2135 05/26/15 0452 05/26/15 1309  BP: 109/54 137/63 131/67 136/66  Pulse: 57 56 53 65  Temp: 97.5 F (36.4 C) 97.7 F (36.5 C) 97.8 F (36.6  C) 97.7 F (36.5 C)  TempSrc: Oral Oral Oral Oral  Resp: '17 18 18 20  ' Height:      Weight:   96.4 kg (212 lb 8.4 oz)   SpO2: 98% 98% 98% 100%    General: awake, in nad Cardiovascular: regular, s1,s 2 Respiratory: normal resp effort, no wheezing  Discharge Instructions     Medication List    STOP taking these medications        metoprolol succinate 25 MG 24 hr tablet  Commonly known as:  TOPROL-XL      TAKE these medications         acetaminophen 500 MG tablet  Commonly known as:  TYLENOL  Take 1,000 mg by mouth every 6 (six) hours as needed for mild pain, moderate pain or headache.     amLODipine 5 MG tablet  Commonly known as:  NORVASC  TAKE ONE TABLET BY MOUTH ONCE DAILY     amoxicillin-clavulanate 875-125 MG per tablet  Commonly known as:  AUGMENTIN  Take 1 tablet by mouth 2 (two) times daily.     aspirin 81 MG tablet  Take 81 mg by mouth daily.     bisacodyl 10 MG suppository  Commonly known as:  DULCOLAX  Place 1 suppository (10 mg total) rectally daily as needed for moderate constipation.     clopidogrel 75 MG tablet  Commonly known as:  PLAVIX  TAKE ONE TABLET BY MOUTH ONCE DAILY     ezetimibe 10 MG tablet  Commonly known as:  ZETIA  Take 1 tablet (10 mg total) by mouth daily.     Fenofibric Acid 135 MG Cpdr  TAKE ONE CAPSULE BY MOUTH ONCE DAILY     ferrous sulfate 325 (65 FE) MG tablet  Take 325 mg by mouth daily with breakfast.     glucose blood test strip  1 each by Other route 2 (two) times daily. THIS IS FOR THE TRUE TRACK METERUse as instructed     losartan 50 MG tablet  Commonly known as:  COZAAR  Take 50 mg by mouth daily.     multivitamin with minerals tablet  Take 1 tablet by mouth daily.     oxyCODONE 5 MG immediate release tablet  Commonly known as:  Oxy IR/ROXICODONE  Take 1 tablet (5 mg total) by mouth every 6 (six) hours as needed for moderate pain. PRN     pantoprazole 40 MG tablet  Commonly known as:  PROTONIX  TAKE ONE TABLET BY MOUTH ONCE DAILY     pioglitazone-metformin 15-500 MG per tablet  Commonly known as:  ACTOPLUS MET  TAKE ONE TABLET BY MOUTH ONCE DAILY     sildenafil 20 MG tablet  Commonly known as:  REVATIO  Take 2-5 pills daily as needed       Allergies  Allergen Reactions  . Lisinopril Cough    Rxn: unknown  . Statins Other (See Comments)    Muscle ache  . Welchol [Colesevelam Hcl] Itching   Follow-up Information    Follow up with  Norberto Sorenson T. Fuller Plan, MD.   Specialty:  Gastroenterology   Why:  Office will call you with appt.   Contact information:   [address] [place] [address] [phone]       Follow up with [name], MD. Schedule an appointment as soon as possible for a visit in 1 week.   Specialty:  Family Medicine   Why:  Hospital follow up   Contact information:  [address] [place] [address] [phone]       Follow up with Tarri Fuller, PA-C On 06/10/2015.   Specialties:  Physician Assistant, Radiology, Interventional Cardiology   Why:  Hospital Follow-Up Appointment on 06/10/2015 at 10:30 AM   Contact information:   [address] [place] [address] [phone]       Follow up with Tinnie Gens, MD. Schedule an appointment as soon as possible for a visit in 6 months.   Specialties:  Vascular Surgery, Interventional Cardiology, Cardiology   Contact information:   [address] [place] [address] [phone]       Follow up with [facility] H, MD. Schedule an appointment as soon as possible for a visit in 1 week.   Specialty:  General Surgery   Contact information:   [place] [address] [phone]        The results of significant diagnostics from this hospitalization (including imaging, microbiology, ancillary and laboratory) are listed below for reference.    Significant Diagnostic Studies: Ct Abdomen Pelvis Wo Contrast  05/21/2015   CLINICAL DATA:  Abdominal pain. Nausea. Bile duct obstruction with stenting. Cholelithiasis. Abdominal aortic aneurysm.  EXAM: CT ABDOMEN AND PELVIS WITHOUT CONTRAST  TECHNIQUE: Multidetector CT imaging of the abdomen and pelvis was performed following the standard protocol without IV contrast.  COMPARISON:  ERCP dated 07/05/2014 and MRI of the abdomen dated 06/30/2014  FINDINGS: Lower chest: Minimal scarring at the lung bases. Large hiatal hernia. Extensive coronary artery  calcification. Extensive calcification in the mitral valve annulus. Heart size is normal.  Hepatobiliary: There is a 5 mm stone in the neck of the gallbladder. There is edema of the wall of the distended gallbladder. No biliary ductal dilatation. Biliary stent in place which appears in good position. Small amount of air in the biliary tree of the left lobe of the liver. Liver parenchyma is otherwise normal.  Pancreas: Normal.  Spleen: Normal.  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 Right adrenal gland is normal. Chronic right renal atrophy. Left kidney is normal. Bladder is normal.  Stomach/Bowel: Large hiatal hernia. Multiple diverticula in the proximal sigmoid portion of the colon. Terminal ileum and appendix are normal.  Vascular/Lymphatic: 4.9 cm saccular abdominal aortic aneurysm. Extensive aortic atherosclerosis. No adenopathy.  Musculoskeletal: No acute abnormality. 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 Previous lumbar fusions.  IMPRESSION: 1. Diffuse edema of the wall of the distended gallbladder with a stone lodged in the neck of the gallbladder. This could represent acute and possibly gangrenous cholecystitis. 2. Biliary stent appears in good position. 3. 4.9 cm saccular abdominal aortic aneurysm. 4. Large hiatal hernia.   Electronically Signed   By: Lorriane Shire M.D.   On: 05/21/2015 21:26   Dg Cholangiogram Operative  05/23/2015   CLINICAL DATA:  Cholecystectomy for cholecystitis. History of biliary stent placement.  EXAM: INTRAOPERATIVE CHOLANGIOGRAM  TECHNIQUE: Cholangiographic images from the C-arm fluoroscopic device were submitted for interpretation post-operatively. Please see the procedural report for the amount of contrast and the fluoroscopy time utilized.  COMPARISON:  CT and ultrasound studies on 05/21/2015  FINDINGS: Intraoperative cholangiogram shows injected contrast entering a metallic stent within the common bile duct.  There is flow of contrast into the duodenum with some irregular filling defects in the stent likely related to sludge and/or calculi. There may be some subtle filling defects in the duct superior to the stent. No contrast extravasation identified.  IMPRESSION: Open metallic common bile duct stent with some probable sludge and/or calculi within the stent lumen.   Electronically Signed   By: Aletta Edouard M.D.   On: 05/23/2015 15:12   US Abdomen Complete  05/21/2015   CLINICAL DATA:  Acute abdominal pain, biliary stent  EXAM: ULTRASOUND ABDOMEN COMPLETE  COMPARISON:  MRI abdomen dated 06/30/2014  FINDINGS: Gallbladder: Gallbladder sludge. Pericholecystic fluid. Negative sonographic Murphy's sign.  Common bile duct: Diameter: 12 mm.  Liver: No focal lesion identified. Within normal limits in parenchymal echogenicity. Intrahepatic ductal dilatation.  IVC: No abnormality visualized.  Pancreas: Not discretely visualized.  Spleen: Size and appearance within normal limits.  Right Kidney: Length: 9.0 cm.  No mass or hydronephrosis.  Left Kidney: Length: 12.0 cm.  No mass or hydronephrosis.  Abdominal aorta: 4.9 cm lower abdominal aortic aneurysm.  Other findings: None.  IMPRESSION: Mild intrahepatic/extrahepatic ductal dilatation. Common duct measures 12 mm.  Layering gallbladder sludge. No sonographic findings to suggest acute cholecystitis.  4.9 cm lower abdominal aortic aneurysm. Recommend followup by abdomen and pelvis CTA in 6 months, and vascular surgery referral/consultation if not already obtained. This recommendation follows ACR consensus guidelines: White Paper of the ACR Incidental Findings Committee II on Vascular Findings. J Am Coll Radiol 2013; 10:789-794.   Electronically Signed   By: Julian Hy M.D.   On: 05/21/2015 17:06    Microbiology: Recent Results (from the past 240 hour(s))  Culture, blood (routine x 2)     Status: None   Collection Time: 05/21/15  7:05 PM  Result Value Ref Range  Status   Specimen Description BLOOD LEFT ARM  Final   Special Requests BOTTLES DRAWN AEROBIC AND ANAEROBIC 5CC  Final   Culture   Final    NO GROWTH 5 DAYS Performed at [facility]    Report Status 05/26/2015 FINAL  Final  Culture, blood (routine x 2)     Status: None   Collection Time: 05/21/15  7:14 PM  Result Value Ref Range Status   Specimen Description BLOOD LEFT ARM  Final   Special Requests BOTTLES DRAWN AEROBIC AND ANAEROBIC 10CC  Final   Culture   Final    NO GROWTH 5 DAYS Performed at [facility]    Report Status 05/26/2015 FINAL  Final  Surgical pcr screen     Status: Abnormal   Collection Time: 05/23/15  7:53 AM  Result Value Ref Range Status   MRSA, PCR NEGATIVE NEGATIVE Final   Staphylococcus aureus POSITIVE (A) NEGATIVE Final    Comment:        The Xpert SA Assay (FDA approved for NASAL specimens in patients over 18 years of age), is one component of a comprehensive surveillance program.  Test performance has been validated by [facility] for patients greater than or equal to 29 year old. It is not intended to diagnose infection nor to guide or monitor treatment.      Labs: Basic Metabolic Panel:  Recent Labs Lab 05/21/15 1905 05/22/15 0502 05/23/15 0450 05/25/15 0523 05/26/15 0817  NA 136 135 134* 137 135  K 4.1 3.7 3.7 4.3 4.0  CL 106 106 105 108 106  CO2 24 21* '22 23 23  ' GLUCOSE 204* 120* 97 135* 155*  BUN '20 18 19 ' 27* 22*  CREATININE 1.30*  1.30* 1.27* 1.56* 1.23 1.28*  CALCIUM 8.9 8.6* 8.7* 8.6* 8.7*   Liver Function Tests:  Recent Labs Lab 05/21/15 1905 05/22/15 0502 05/23/15 0450 05/25/15 0523 05/26/15  0817  AST 57* 35 22 39 41  ALT 42 32 25 45 52  ALKPHOS 32* 28* 28* 26* 30*  BILITOT 0.4 1.1 1.7* 0.7 0.7  PROT 7.0 6.0* 6.1* 5.6* 6.1*  ALBUMIN 3.7 3.4* 3.2* 2.5* 2.7*    Recent Labs Lab 05/21/15 1018  LIPASE 31.0   No results for input(s): AMMONIA in the last 168 hours. CBC:  Recent Labs Lab  05/21/15 1018 05/21/15 1905 05/22/15 0502 05/23/15 0450 05/25/15 0523  WBC 9.0 13.7* 15.1* 15.6* 10.0  NEUTROABS 7.2  --   --   --   --   HGB 11.3* 10.8* 10.4* 10.8* 8.8*  HCT 34.0* 33.6* 32.6* 32.2* 27.8*  MCV 94.8 95.5 95.0 95.0 94.9  PLT 185.0 171 166 154 146*   Cardiac Enzymes:  Recent Labs Lab 05/23/15 1615  CKTOTAL 36*  CKMB 15.5*  TROPONINI 0.03   BNP: BNP (last 3 results) No results for input(s): BNP in the last 8760 hours.  ProBNP (last 3 results)  Recent Labs  06/27/14 1954 07/04/14 1300  PROBNP 333.0 397.2    CBG:  Recent Labs Lab 05/25/15 0735 05/25/15 1206 05/25/15 1645 05/25/15 2134 05/26/15 0743  GLUCAP 131* 123* 155* 125* 123*    Signed:  CHIU, STEPHEN K  [facility] 05/26/2015, 2:00 PM

## 2015-05-26 NOTE — Progress Notes (Signed)
Patient Name: [name] Date of Encounter: 05/26/2015  Principal Problem:   Biliary obstruction Active Problems:   Type II diabetes mellitus with complication -- CAD, AAA   Hypertension associated with diabetes   Hyperlipidemia LDL goal <70; statin intolerant   Chronic diastolic CHF (congestive heart failure)   S/P CABG x 2: 1997. SVG-OM (known to be occluded), LIMA-LAD   Cholelithiasis with cholecystitis   AAA (abdominal aortic aneurysm)    Primary Cardiologist: Dr. Ellyn Hack Patient Profile: 79 yo male w/ PMH of CAD (CABG in 1997 w/ LIMA to LAD, SVG to OM and PCI on 05/17/14 w/ DES in mid obtuse marginal and proximal RCA), HTN, HLD, and AAA admitted on 05/21/2015 for cholecystitis. Underwent cholecystectomy on 05/23/15.  SUBJECTIVE: Feeling well. Says he made good progress over the weekend by advancing to solid foods as well as walking around the hallway several times. Denies any chest pain, shortness of breath, or palpitations.  OBJECTIVE Filed Vitals:   05/25/15 0501 05/25/15 1346 05/25/15 2135 05/26/15 0452  BP: 125/66 109/54 137/63 131/67  Pulse: 51 57 56 53  Temp: 97.4 F (36.3 C) 97.5 F (36.4 C) 97.7 F (36.5 C) 97.8 F (36.6 C)  TempSrc: Oral Oral Oral Oral  Resp: 14 17 18 18   Height:      Weight: 212 lb 1.3 oz (96.2 kg)   212 lb 8.4 oz (96.4 kg)  SpO2: 96% 98% 98% 98%    Intake/Output Summary (Last 24 hours) at 05/26/15 0744 Last data filed at 05/26/15 3154  Gross per 24 hour  Intake   2578 ml  Output   1817 ml  Net    761 ml   Filed Weights   05/23/15 1700 05/25/15 0501 05/26/15 0452  Weight: 211 lb 10.3 oz (96 kg) 212 lb 1.3 oz (96.2 kg) 212 lb 8.4 oz (96.4 kg)    PHYSICAL EXAM General: Well developed, well nourished, male in no acute distress. Head: Normocephalic, atraumatic.  Neck: Supple without bruits, JVD not elevated. Lungs:  Resp regular and unlabored, CTA without wheezing or rales. Heart: RRR, S1, S2, no S3, S4, or murmur; no  rub. Abdomen: Soft, non-distended with normoactive bowel sounds. Tender along surgical incision sites. Extremities: No clubbing, no cyanosis, no edema. Distal pedal pulses are 2+ bilaterally. Neuro: Alert and oriented X 3. Moves all extremities spontaneously. Psych: Normal affect.  LABS: CBC: Recent Labs  05/25/15 0523  WBC 10.0  HGB 8.8*  HCT 27.8*  MCV 94.9  PLT 008*   Basic Metabolic Panel: Recent Labs  05/25/15 0523  NA 137  K 4.3  CL 108  CO2 23  GLUCOSE 135*  BUN 27*  CREATININE 1.23  CALCIUM 8.6*   Liver Function Tests: Recent Labs  05/25/15 0523  AST 39  ALT 45  ALKPHOS 26*  BILITOT 0.7  PROT 5.6*  ALBUMIN 2.5*   Cardiac Enzymes: Recent Labs  05/23/15 1615  CKTOTAL 36*  CKMB 15.5*  TROPONINI 0.03   TELE: Sinus rhythm with rate in 40's - 60's. Occasional PVC's.      ECG: 05/25/15: Sinus bradycardia w/ rate in 50's.   Current Medications:  . amLODipine  5 mg Oral Daily  . aspirin EC  81 mg Oral Daily  . clopidogrel  75 mg Oral Daily  . ezetimibe  10 mg Oral Daily  . ferrous sulfate  325 mg Oral Q breakfast  . folic acid  1 mg Oral Daily  . heparin  5,000 Units  Subcutaneous 3 times per day  . insulin aspart  0-15 Units Subcutaneous TID WC  . insulin aspart  0-5 Units Subcutaneous QHS  . losartan  50 mg Oral Daily  . metoprolol succinate  25 mg Oral Daily  . multivitamin with minerals  1 tablet Oral Daily  . pantoprazole  40 mg Oral Daily  . piperacillin-tazobactam (ZOSYN)  IV  3.375 g Intravenous 3 times per day  . sodium chloride  3 mL Intravenous Q12H  . thiamine  100 mg Oral Daily   Or  . thiamine  100 mg Intravenous Daily   . sodium chloride 0.45 % 1,000 mL with potassium chloride 20 mEq infusion 75 mL/hr at 05/25/15 0100    ASSESSMENT AND PLAN:  1. CAD s/p CABG (1997 w/ LIMA to LAD, SVG to OM) and PCI (05/17/14 w/ DES in mid obtuse marginal and proximal RCA) - without angina since April 2016.  - Patient had been off Plavix since  05/20/2015. Resumed ASA and Plavix on 05/25/15.  - HR has been in 40's - 50's at times in the past 24 hours. Continue holding BB for now until HR can tolerate the addition of it.  2. Hypertension  - BP has been 109/54 - 137/67 in the past 24 hours. - continue ARB  3. HLD - Continue Zetia (Statin Intolerant).   4. Biliary Obstruction - s/p cholecystectomy on 05/21/15. - planning to have biliary stents removed in outpatient setting.  Signed, Dineen Kid , PA-C 7:44 AM 05/26/2015    The patient was seen, examined and discussed with Dineen Kid, PA-C and I agree with the above.    79 year old male admitted with acute cholecystitis with known CAD, distant CABG and PCI in 05/2014, off Plavix, we recommend to restart DAPT with ASA/Plavix today, continue holding metoprolol as his baseline HR in 50'. We will arrange for a follow up with Dr Ellyn Hack and reassess. He can be discharged today.  Dorothy Spark 05/26/2015

## 2015-05-26 NOTE — Telephone Encounter (Signed)
-----   Message from Vita Barley Hvozdovic, PA-C sent at 05/24/2015  9:45 AM EDT ----- Barbera Setters, Pt had lap chole Fri 8/19. Needs appt in office to schedule ERCP with stent removal ( has appt 9/28 but that's too far out). Can he get in in 2 weeks with Dr Fuller Plan or with an APP when he is in [place] and I both know pt). Thanks for your help. Cecille Rubin

## 2015-05-26 NOTE — Telephone Encounter (Signed)
appt scheduled with his wife for 06/11/15 3:00.  She is aware.  He is still inpatient.

## 2015-05-26 NOTE — Progress Notes (Signed)
Patient and wife were both educated on drain care at home. They were instructed to follow up with Dr. Lucia Gaskins on drain removal.  No further questions at this time.

## 2015-05-26 NOTE — Progress Notes (Signed)
Patient ambulated the full length of the hallway; approximately 386 feet.

## 2015-05-26 NOTE — Care Management Note (Signed)
Case Management Note  Patient Details  Name: ELIJAN GOOGE MRN: [id number] Date of Birth: 12/10/1935  Subjective/Objective:                    Action/Plan:d/c home no needs or orders.   Expected Discharge Date:   (unknown)               Expected Discharge Plan:  Home/Self Care  In-House Referral:     Discharge planning Services  CM Consult  Post Acute Care Choice:    Choice offered to:     DME Arranged:    DME Agency:     HH Arranged:    [place] Agency:     Status of Service:  Completed, signed off  Medicare Important Message Given:  Yes-second notification given Date Medicare IM Given:    Medicare IM give by:    Date Additional Medicare IM Given:    Additional Medicare Important Message give by:     If discussed at [place] of Stay Meetings, dates discussed:    Additional Comments:  Dessa Phi, RN 05/26/2015, 2:46 PM

## 2015-05-26 NOTE — Progress Notes (Signed)
3 Days Post-Op  Subjective: Feeling better overall.  Still required some assistance with walking.  Tolerating diet.  Passing gas but no BM yet.  Objective: Vital signs in last 24 hours: Temp:  [97.5 F (36.4 C)-97.8 F (36.6 C)] 97.8 F (36.6 C) (08/22 0452) Pulse Rate:  [53-57] 53 (08/22 0452) Resp:  [17-18] 18 (08/22 0452) BP: (109-137)/(54-67) 131/67 mmHg (08/22 0452) SpO2:  [98 %] 98 % (08/22 0452) Weight:  [96.4 kg (212 lb 8.4 oz)] 96.4 kg (212 lb 8.4 oz) (08/22 0452) Last BM Date: 05/20/15  Intake/Output from previous day: 08/21 0701 - 08/22 0700 In: 2578 [P.O.:600; I.V.:1878; IV Piggyback:100] Out: 0932 [Urine:1730; Drains:87] Intake/Output this shift: Total I/O In: 410 [P.O.:360; IV Piggyback:50] Out: 302.5 [Urine:300; Drains:2.5]  PE: General- In NAD Abdomen-soft, incisions are clean and intact  Lab Results:   Recent Labs  05/25/15 0523  WBC 10.0  HGB 8.8*  HCT 27.8*  PLT 146*   BMET  Recent Labs  05/25/15 0523 05/26/15 0817  NA 137 135  K 4.3 4.0  CL 108 106  CO2 23 23  GLUCOSE 135* 155*  BUN 27* 22*  CREATININE 1.23 1.28*  CALCIUM 8.6* 8.7*   PT/INR No results for input(s): LABPROT, INR in the last 72 hours. Comprehensive Metabolic Panel:    Component Value Date/Time   NA 135 05/26/2015 0817   NA 137 05/25/2015 0523   K 4.0 05/26/2015 0817   K 4.3 05/25/2015 0523   CL 106 05/26/2015 0817   CL 108 05/25/2015 0523   CO2 23 05/26/2015 0817   CO2 23 05/25/2015 0523   BUN 22* 05/26/2015 0817   BUN 27* 05/25/2015 0523   CREATININE 1.28* 05/26/2015 0817   CREATININE 1.23 05/25/2015 0523   CREATININE 1.31 01/09/2015 0927   CREATININE 1.18 06/19/2014 1150   GLUCOSE 155* 05/26/2015 0817   GLUCOSE 135* 05/25/2015 0523   CALCIUM 8.7* 05/26/2015 0817   CALCIUM 8.6* 05/25/2015 0523   AST 41 05/26/2015 0817   AST 39 05/25/2015 0523   ALT 52 05/26/2015 0817   ALT 45 05/25/2015 0523   ALKPHOS 30* 05/26/2015 0817   ALKPHOS 26* 05/25/2015  0523   BILITOT 0.7 05/26/2015 0817   BILITOT 0.7 05/25/2015 0523   PROT 6.1* 05/26/2015 0817   PROT 5.6* 05/25/2015 0523   ALBUMIN 2.7* 05/26/2015 0817   ALBUMIN 2.5* 05/25/2015 0523     Studies/Results: No results found.  Anti-infectives: Anti-infectives    Start     Dose/Rate Route Frequency Ordered Stop   05/22/15 1200  piperacillin-tazobactam (ZOSYN) IVPB 3.375 g     3.375 g 12.5 mL/hr over 240 Minutes Intravenous 3 times per day 05/22/15 1019     05/22/15 0000  Ampicillin-Sulbactam (UNASYN) 3 g in sodium chloride 0.9 % 100 mL IVPB  Status:  Discontinued     3 g 100 mL/hr over 60 Minutes Intravenous Every 6 hours 05/21/15 2124 05/22/15 1019   05/21/15 1745  Ampicillin-Sulbactam (UNASYN) 3 g in sodium chloride 0.9 % 100 mL IVPB     3 g 100 mL/hr over 60 Minutes Intravenous  Once 05/21/15 1731 05/21/15 1921      Assessment Principal Problem:  Acute calculous cholecystitis s/p lap chole with IOC 05/23/15 (Dr. Newman)-tolerating diet, wounds healing well, drain output not bilious, somewhat deconditioned Active Problems:   Type II diabetes mellitus with complication    Chronic diastolic CHF (congestive heart failure)   S/P CABG x 2: 1997. SVG-OM (known to be occluded), LIMA-LAD  Cholelithiasis with cholecystitis   AAA (abdominal aortic aneurysm)    LOS: 5 days   Plan: Teach family and patient drain care.  [place].  Drain will need to stay in for 7-10 days.  Follow up with Dr. Lucia Gaskins in office later this week or early next week for drain removal.  Recommend abxs for 7 days total after day of surgery.     [name] Lenna Sciara 05/26/2015

## 2015-05-26 NOTE — Care Management Important Message (Signed)
Important Message  Patient Details  Name: [name] MRN: [id number] Date of Birth: 1935/12/11   Medicare Important Message Given:  Yes-second notification given    Dessa Phi, RN 05/26/2015, 2:46 PM

## 2015-05-26 NOTE — Anesthesia Postprocedure Evaluation (Signed)
  Anesthesia Post-op Note  Patient: [name]  Procedure(s) Performed: Procedure(s): LAPAROSCOPIC CHOLECYSTECTOMY WITH INTRAOPERATIVE CHOLANGIOGRAM (N/A)  Patient Location: PACU  Anesthesia Type:General  Level of Consciousness: awake, alert , oriented and patient cooperative  Airway and Oxygen Therapy: Patient Spontanous Breathing and Patient connected to face mask oxygen  Post-op Pain: none  Post-op Assessment: Post-op Vital signs reviewed, Patient's Cardiovascular Status Stable, Respiratory Function Stable, Patent Airway, No signs of Nausea or vomiting and Pain level controlled              Post-op Vital Signs: Reviewed and stable  Last Vitals:  Filed Vitals:   05/26/15 0452  BP: 131/67  Pulse: 53  Temp: 36.6 C  Resp: 18    Complications: No apparent anesthesia complications   Patient found to be in Atrial fibrillation on post-op EKG.  Discussed case with surgeon and cardiology.  BP stable, patient mentating well, denied CP, SOB, HR in low 100s. Cardiac enzymes sent and negative.

## 2015-05-27 ENCOUNTER — Telehealth: Payer: Self-pay | Admitting: Family Medicine

## 2015-05-27 NOTE — Telephone Encounter (Signed)
Pt was released from hospital yesterday. Spoke to [name] who is his spouse/care provider. She states that he is doing pretty good but weak. She states that most medications stayed the same however they did stop toprol and added plavix. She states that he does have all his medications at the house but he is getting low on amlodipine. She states she plans on going by pharmacy today to pick that up. She made an appt for him to come in on Thursday. She was reminded to bring all medications with him including OTC. She did state that she was not giving him aspirin because he is on plavix.. I discussed with JCL and he stated it was ok to take both, however [name] still had concerns so Per JCL pt was advised to just wait on that until Thursday and discuss then.

## 2015-05-29 ENCOUNTER — Ambulatory Visit (INDEPENDENT_AMBULATORY_CARE_PROVIDER_SITE_OTHER): Payer: Medicare Other | Admitting: Family Medicine

## 2015-05-29 ENCOUNTER — Encounter: Payer: Self-pay | Admitting: Family Medicine

## 2015-05-29 VITALS — BP 100/50 | HR 76 | Wt 205.0 lb

## 2015-05-29 DIAGNOSIS — Z23 Encounter for immunization: Secondary | ICD-10-CM

## 2015-05-29 DIAGNOSIS — Z79899 Other long term (current) drug therapy: Secondary | ICD-10-CM | POA: Diagnosis not present

## 2015-05-29 DIAGNOSIS — I714 Abdominal aortic aneurysm, without rupture, unspecified: Secondary | ICD-10-CM

## 2015-05-29 DIAGNOSIS — N528 Other male erectile dysfunction: Secondary | ICD-10-CM | POA: Diagnosis not present

## 2015-05-29 DIAGNOSIS — I152 Hypertension secondary to endocrine disorders: Secondary | ICD-10-CM

## 2015-05-29 DIAGNOSIS — E1159 Type 2 diabetes mellitus with other circulatory complications: Secondary | ICD-10-CM

## 2015-05-29 DIAGNOSIS — E118 Type 2 diabetes mellitus with unspecified complications: Secondary | ICD-10-CM

## 2015-05-29 DIAGNOSIS — E1169 Type 2 diabetes mellitus with other specified complication: Secondary | ICD-10-CM | POA: Diagnosis not present

## 2015-05-29 DIAGNOSIS — F1010 Alcohol abuse, uncomplicated: Secondary | ICD-10-CM

## 2015-05-29 DIAGNOSIS — I10 Essential (primary) hypertension: Secondary | ICD-10-CM | POA: Diagnosis not present

## 2015-05-29 DIAGNOSIS — K831 Obstruction of bile duct: Secondary | ICD-10-CM

## 2015-05-29 DIAGNOSIS — N529 Male erectile dysfunction, unspecified: Secondary | ICD-10-CM

## 2015-05-29 NOTE — Progress Notes (Signed)
   Subjective:    Patient ID: [name], male    DOB: 11/05/1935, 79 y.o.   MRN: [id number]  HPI He is here for a hospital follow-up. He was recently admitted for treatment of biliary obstruction with cautery cystitis. Stone was noted. Etiology was consult at as he does have underlying cardiac disease and was on Plavix as well as aspirin.He has underlying diabetes, hypertension and hyperlipidemia and is on medications for this. Also of note is his alcohol use of drinking 45 beers per day. We'll in the hospital he did stop his metopic wall as well as hold his aspirin and Plavix. During the exam his AAA was noted to be enlarged from previous evaluation. Presently he is doing well having pain only in the area of his drain. He was given 4 days of an anabiotic and is about to finish that. He continues to use pain medication on an as-needed basis. He's had no fever, chills, abdominal pain,Nausea, vomiting, chest pain or shortness of breath. He has had some difficulty with constipation.   Review of Systems     Objective:   Physical Exam Alert and in no distress. Abdominal tube noted in the right upper quadrant. Abdominal exam shows no masses or tenderness. Cardiac exam shows regular rhythm without murmurs or gallops. Lungs are clear to auscultation.       Assessment & Plan:  AAA (abdominal aortic aneurysm) without rupture - Plan: US Aorta  Need for prophylactic vaccination and inoculation against influenza - Plan: Flu vaccine HIGH DOSE PF (Fluzone High dose)  Type II diabetes mellitus with complication -- CAD, AAA  Medication management  Hypertension associated with diabetes  Biliary obstruction  ED (erectile dysfunction) of organic origin  Alcohol abuse Order was placed for repeat ultrasound in approximately 6 months. He will continue on his diabetes and lipid medications as well as cardiac med specifically aspirin and Plavix. Encouraged him to avoid all alcohol consumption. At the  end of the encounter he did ask for a sample of Viagra which was given to him. Also flu shot given. Reinforced the need for him to finish the antibody, take pain medication as needed and use MiraLAX to help with his constipation. Approximately one half hour spent discussing all these  issues  with him and his wife.

## 2015-05-29 NOTE — Patient Instructions (Signed)
Use the MiraLAX for the constipation and get up and be as active as you can. Take the pain medicine as needed. Avoid the alcohol. I set you up for the ultrasound for February.

## 2015-06-04 DIAGNOSIS — I714 Abdominal aortic aneurysm, without rupture: Secondary | ICD-10-CM | POA: Diagnosis not present

## 2015-06-04 DIAGNOSIS — I2510 Atherosclerotic heart disease of native coronary artery without angina pectoris: Secondary | ICD-10-CM | POA: Diagnosis not present

## 2015-06-10 ENCOUNTER — Ambulatory Visit (INDEPENDENT_AMBULATORY_CARE_PROVIDER_SITE_OTHER): Payer: Medicare Other | Admitting: Physician Assistant

## 2015-06-10 ENCOUNTER — Encounter: Payer: Self-pay | Admitting: Physician Assistant

## 2015-06-10 ENCOUNTER — Other Ambulatory Visit: Payer: Self-pay | Admitting: Family Medicine

## 2015-06-10 VITALS — BP 122/74 | HR 68 | Ht 69.0 in | Wt 200.5 lb

## 2015-06-10 DIAGNOSIS — I5032 Chronic diastolic (congestive) heart failure: Secondary | ICD-10-CM

## 2015-06-10 DIAGNOSIS — I2510 Atherosclerotic heart disease of native coronary artery without angina pectoris: Secondary | ICD-10-CM

## 2015-06-10 DIAGNOSIS — E1169 Type 2 diabetes mellitus with other specified complication: Secondary | ICD-10-CM | POA: Diagnosis not present

## 2015-06-10 DIAGNOSIS — Z951 Presence of aortocoronary bypass graft: Secondary | ICD-10-CM

## 2015-06-10 DIAGNOSIS — I714 Abdominal aortic aneurysm, without rupture, unspecified: Secondary | ICD-10-CM

## 2015-06-10 DIAGNOSIS — E1159 Type 2 diabetes mellitus with other circulatory complications: Secondary | ICD-10-CM

## 2015-06-10 DIAGNOSIS — E785 Hyperlipidemia, unspecified: Secondary | ICD-10-CM

## 2015-06-10 DIAGNOSIS — Z9861 Coronary angioplasty status: Secondary | ICD-10-CM | POA: Diagnosis not present

## 2015-06-10 DIAGNOSIS — I10 Essential (primary) hypertension: Secondary | ICD-10-CM

## 2015-06-10 DIAGNOSIS — I152 Hypertension secondary to endocrine disorders: Secondary | ICD-10-CM

## 2015-06-10 MED ORDER — AMLODIPINE BESYLATE 5 MG PO TABS: ORAL_TABLET | ORAL | Status: DC

## 2015-06-10 NOTE — Patient Instructions (Addendum)
Your physician wants you to follow-up in: 3 Months with Dr Ellyn Hack. You will receive a reminder letter in the mail two months in advance. If you don't receive a letter, please call our office to schedule the follow-up appointment.  You have been referred to Dr Kellie Simmering at [facility] for AAA

## 2015-06-10 NOTE — Progress Notes (Signed)
Patient ID: [name], male   DOB: 1935-11-07, 79 y.o.   MRN: [id number]    Date:  06/10/2015   ID:  [name], DOB 1935/10/09, MRN [id number]  PCP:  [name], MD  Primary Cardiologist:  Ellyn Hack  Chief Complaint  Patient presents with  . Hospitalization Follow-up    pt had gallbladder remove, pt denied chest pain      History of Present Illness: [name] is a 79 y.o. male   The patient is a 79yo male with a history of CAD, CABG x2-1997(LIMA to LAD, SVG to OM), PVCs, PACs, OSA, HTN, dyslipidemia, AAA. His last cardiac cath was 06/28/14. The sites of PCI done in May 17, 2014, including the proximal RCA and obtuse marginal branch, were widely patent without restenosis. Both stents were drug eluding. There was no change in the previous diffusely diseased AV groove circumflex, which is a small-caliber vessel with a prominent region of ectasia. LV function ~45%.    Patient was admitted August 2016 with worsening abdominal pain and underwent laparoscopic cholecystectomy with intraoperative cholangiogram.  He saw for preoperative clearance.  Aspirin and Plavix have been stopped and then resumed on 05/25/2015. He had heart rate in the 40s and 50s and so we held his beta blocker.  He is on Zetia because he is statin intolerant.  He is here for posthospital follow-up and reports feeling generally weak but otherwise denies nausea, vomiting, fever, chest pain, shortness of breath, orthopnea, dizziness, PND, cough, congestion, abdominal pain, hematochezia, melena, lower extremity edema.  Wt Readings from Last 3 Encounters:  06/10/15 200 lb 8 oz (90.946 kg)  05/29/15 205 lb (92.987 kg)  05/26/15 212 lb 8.4 oz (96.4 kg)     Past Medical History  Diagnosis Date  . History of: ST elevation myocardial infarction (STEMI) involving left circumflex coronary artery with complication 6144    PTCA-circumflex; PCI in 1991  . CAD, multiple vessel 1985-2010    Most recent cath  01/01/09: 100% Occluded LAD after SP1, patent LIMA-distal LAD with apical 90% lesion.  Cx-OM1 widely patent stent into proximal bifurcating OM1 . Follow-on Cx => 70-80% -- non-amenable PCI. Widely patent 3 overlapping stents in midRCA with only 40% RPL stenosis; SVG- OM known to be occluded.  . S/P CABG x 2 1997    LIMA-LAD, SVG-OM  . H/O unstable angina 06/2003, 12/2006, 05/2014    a) '04: Staged Taxus DES PCI to RCA and Cx-OM;;'08 - PCI to proximal ISR in RCA - Cypher DES; c) 05/2014: ISR in RCA & OM1, PCI with Promus P DES: RCA  2.75 x 12, OM1 3.0 mm x 8)  . History of nuclear stress test December 2013    LOW at Risk. Moderate region of mid to basal inferolateral scar without ischemia -- consistent with distal circumflex disease. Mild apical hypokinesis with an EF of 47%.  . Irregular heart beat     PVCs and PACs, no arrhythmia recorded  . Exertional dyspnea     Chronic baseline SOB with ambulation  . Sleep apnea     pt. states he was told to return for f/u, to be fitted for CPAp, but pt. reports that he didn't follow up  . AAA (abdominal aortic aneurysm) 08/30/12    Doppler 08/30/12 had a small amount of growth. It was a 4.2 x 4.3 cm greater in size than the previous one noted at 4 x 3.8.  . Diabetes mellitus     Not on oral  medication  . Hypertension   . Dyslipidemia, goal LDL below 70   . Osteoarthritis of both knees     And back; multiple back surgeries, right knee arthroplasty and left knee arthroscopic surgery x2  . Chronic back pain      multiple surgeries; C-spine and lumbar  . Erectile dysfunction   . H/O: pneumonia February 14  . Chronic anemia     On iron supplement; history of positive guaiac - negative colonoscopy in 1996.; Thought to be related to hemorrhoids; status post hemorrhoidectomy  . Ankle edema     Chronic  . Diverticulitis of colon 1996  . Choledocholithiasis   . Hemorrhoids   . Diverticulosis   . Adenomatous colon polyp   . Hiatal hernia   . Cholelithiasis -  with cholangitis     status post ERCP with removal of calculi and biliary stent placement.    Current Outpatient Prescriptions  Medication Sig Dispense Refill  . acetaminophen (TYLENOL) 500 MG tablet Take 1,000 mg by mouth every 6 (six) hours as needed for mild pain, moderate pain or headache.    Marland Kitchen amLODipine (NORVASC) 5 MG tablet TAKE ONE TABLET BY MOUTH ONCE DAILY (Patient taking differently: TAKE 5 MG BY MOUTH DAILY) 90 tablet 3  . aspirin 81 MG tablet Take 81 mg by mouth daily.    . bisacodyl (DULCOLAX) 10 MG suppository Place 1 suppository (10 mg total) rectally daily as needed for moderate constipation. 12 suppository 0  . Choline Fenofibrate (FENOFIBRIC ACID) 135 MG CPDR TAKE ONE CAPSULE BY MOUTH ONCE DAILY (Patient taking differently: TAKE 135 MG BY MOUTH DAILY) 90 capsule 3  . clopidogrel (PLAVIX) 75 MG tablet TAKE ONE TABLET BY MOUTH ONCE DAILY (Patient taking differently: TAKE 75 MG BY MOUTH ONCE DAILY) 90 tablet 3  . ezetimibe (ZETIA) 10 MG tablet Take 1 tablet (10 mg total) by mouth daily. 90 tablet 2  . ferrous sulfate 325 (65 FE) MG tablet Take 325 mg by mouth daily with breakfast.    . glucose blood test strip 1 each by Other route 2 (two) times daily. THIS IS FOR THE TRUE TRACK METERUse as instructed 100 each 12  . losartan (COZAAR) 50 MG tablet Take 50 mg by mouth daily.    . Multiple Vitamins-Minerals (MULTIVITAMIN WITH MINERALS) tablet Take 1 tablet by mouth daily.     Marland Kitchen oxyCODONE (OXY IR/ROXICODONE) 5 MG immediate release tablet Take 1 tablet (5 mg total) by mouth every 6 (six) hours as needed for moderate pain. PRN 20 tablet 0  . pantoprazole (PROTONIX) 40 MG tablet TAKE ONE TABLET BY MOUTH ONCE DAILY (Patient taking differently: TAKE 40 MG BY MOUTH DAILY) 90 tablet 3  . pioglitazone-metformin (ACTOPLUS MET) 15-500 MG per tablet TAKE ONE TABLET BY MOUTH ONCE DAILY 90 tablet 0  . sildenafil (REVATIO) 20 MG tablet Take 2-5 pills daily as needed (Patient taking differently:  Take 40-100 mg by mouth daily as needed (erectile dysfunction). Take 2-5 pills daily as needed) 50 tablet 5   No current facility-administered medications for this visit.    Allergies:    Allergies  Allergen Reactions  . Lisinopril Cough    Rxn: unknown  . Statins Other (See Comments)    Muscle ache  . Welchol [Colesevelam Hcl] Itching    Social History:  The patient  reports that he quit smoking about 31 years ago. His smoking use included Cigarettes. He has never used smokeless tobacco. He reports that he drinks about 1.8 oz  of alcohol per week. He reports that he does not use illicit drugs.   Family history:   Family History  Problem Relation Age of Onset  . Arthritis Mother   . Diabetes Father   . Heart disease Father   . Stroke Sister   . Hypertension Sister   . Heart disease Sister   . Diabetes Sister   . Breast cancer Sister   . Heart disease Brother   . Ulcers Brother   . Colon cancer Neg Hx     ROS:  Please see the history of present illness.  All other systems reviewed and negative.   PHYSICAL EXAM: VS:  BP 122/74 mmHg  Pulse 68  Ht _0  (1.753 m)  Wt 200 lb 8 oz (90.946 kg)  BMI 29.60 kg/m2 Well nourished, well developed, in no acute distress HEENT: Pupils are equal round react to light accommodation extraocular movements are intact.  Neck: no JVDNo cervical lymphadenopathy. Cardiac: Regular rate and rhythm with a 1/6 systolic murmur  Lungs:  Diffuse rhonchi  Abd: soft, nontender, positive bowel sounds all quadrants, no hepatosplenomegaly.  Laparoscopic sites healing well of signs of infection. Ext: no lower extremity edema.  2+ radial and dorsalis pedis pulses. Skin: warm and dry Neuro:  Grossly normal  EKG:  Sinus rhythm with PVCs rate 60 bpm  ASSESSMENT AND PLAN:  Problem List Items Addressed This Visit    S/P CABG x 2: 1997. SVG-OM (known to be occluded), LIMA-LAD (Chronic)   Hypertension associated with diabetes (Chronic)   Hyperlipidemia LDL  goal <70; statin intolerant (Chronic)   Chronic diastolic CHF (congestive heart failure) (Chronic)   CAD S/P CABG '95- several PCis since, last 05/17/14 (Chronic)   AAA (abdominal aortic aneurysm) without rupture - Primary (Chronic)      1. CAD s/p CABG (1997 w/ LIMA to LAD, SVG to OM) and PCI (05/17/14 w/ DES in mid obtuse marginal and proximal RCA) Patient's heart rate is currently 68 bpm.  Will hold off on adding back a beta blocker at this time  2. Hypertension  Well-controlled. Currently on amlodipine 5 mg losartan 50  3. HLD   Continue Zetia (Statin Intolerant).   4. Biliary Obstruction s/p cholecystectomy on 05/21/15.  He is supposed to follow-up with [facility] regarding removal of biliary stent.  5.  Abdominal aortic aneurysm This was 4.9 cm on his CT scan during his recent admission. This 0.5 cm bigger than previous ultrasound.  Patient will be referred to Dr. Kellie Simmering. 6.  Chronic diastolic heart failure Appears euvolemic.

## 2015-06-11 ENCOUNTER — Ambulatory Visit (INDEPENDENT_AMBULATORY_CARE_PROVIDER_SITE_OTHER): Payer: Medicare Other | Admitting: Gastroenterology

## 2015-06-11 ENCOUNTER — Telehealth: Payer: Self-pay

## 2015-06-11 ENCOUNTER — Encounter: Payer: Self-pay | Admitting: Gastroenterology

## 2015-06-11 VITALS — BP 110/60 | HR 62 | Ht 69.0 in | Wt 202.0 lb

## 2015-06-11 DIAGNOSIS — I2510 Atherosclerotic heart disease of native coronary artery without angina pectoris: Secondary | ICD-10-CM

## 2015-06-11 DIAGNOSIS — Z9861 Coronary angioplasty status: Secondary | ICD-10-CM

## 2015-06-11 DIAGNOSIS — K8051 Calculus of bile duct without cholangitis or cholecystitis with obstruction: Secondary | ICD-10-CM

## 2015-06-11 NOTE — Patient Instructions (Signed)
You have been scheduled for an ERCP. Please follow written instructions given to you at your visit today. If you use inhalers (even only as needed), please bring them with you on the day of your procedure.  Thank you for choosing me and [facility].  Pricilla Riffle. Dagoberto Ligas., MD., Marval Regal

## 2015-06-11 NOTE — Telephone Encounter (Signed)
   Dear Dr. Ellyn Hack,    We have scheduled the above patient for an endoscopic procedure. Our records show that he is on anticoagulation therapy.   Please advise as to how long the patient may come off his therapy of Plavix prior to the ERCP, which is scheduled for 06/30/15.  Please fax back/ or route your answer to Marlon Pel, [place] at [phone].   Sincerely,    Marlon Pel, CMA Lucio Edward, MD

## 2015-06-11 NOTE — Progress Notes (Signed)
    History of Present Illness: This is a 79 year old male returning for follow-up of bile duct stones and a biliary stent placed in 2015 for temporary treatment prevent obstruction from CBD stones. He is accompanied by his wife. He was not a sphincterotomy or surgical candidate for the past year due to Plavix requirement from cardiac stent placement in 05/2014. He was hospitalized 3 weeks ago for severe epigastric pain and diagnosed with acute cholecystitis and he underwent cholecystectomy with IOC. He has recovered from his cholecystectomy except for some mild incisional area soreness. He has been released by Dr. Lucia Gaskins.   --07/03/14 ERCP #1: Metal stent placement. Developed pancreatitis, cholangitis, stone occlusion of stent post-op  --07/05/14 ERCP # 2: Stent replaced, bile duct swept of stones and pus. --05/23/15 Cholecystectomy for acute cholecystitis. IOC showed CBD stones vs sludge and biliary stent.    Current Medications, Allergies, Past Medical History, Past Surgical History, Family History and Social History were reviewed in [employer] record.  Physical Exam: General: Well developed , well nourished, no acute distress Head: Normocephalic and atraumatic Eyes:  sclerae anicteric, EOMI Ears: Normal auditory acuity Mouth: No deformity or lesions Lungs: Clear throughout to auscultation Heart: Regular rate and rhythm; no murmurs, rubs or bruits Abdomen: Soft, non tender and non distended. No masses, hepatosplenomegaly or hernias noted. Normal Bowel sounds Musculoskeletal: Symmetrical with no gross deformities  Pulses:  Normal pulses noted Extremities: No clubbing, cyanosis, edema or deformities noted Neurological: Alert oriented x 4, grossly nonfocal Psychological:  Alert and cooperative. Normal mood and affect  Assessment and Recommendations:  1. Choledocholithiasis versus sludge with indwelling biliary stent. Schedule ERCP for stent, stone removal and possible  sphincterotomy. The risks benefits and alternatives to ERCP discussed with the patient and he consents to proceed. Risks include but are not limited to pancreatitis, infection, perforation, sedation related complications.  2. Coronary artery disease status post stent placement in August 2015. He is now over 1 year out from his stent. Hold Plavix 5 days before procedure - will instruct when and how to resume after procedure. Low but real risk of cardiovascular event such as heart attack, stroke, embolism, thrombosis or ischemia/infarct of other organs off Plavix explained and need to seek urgent help if this occurs. The patient consents to proceed. Will communicate by phone or EMR with patient's prescribing provider to confirm that holding Plavix is reasonable in this case.   3. Status post cholecystectomy for acute cholecystitis in August 2016.

## 2015-06-11 NOTE — Telephone Encounter (Signed)
He should be fine stopping his Plavix now. He is over 1 year out from his PCI. Can hold Plavix for 5-7 days and restart when safe post  Procedure.   Leonie Man, MD

## 2015-06-12 ENCOUNTER — Encounter: Payer: Self-pay | Admitting: Physician Assistant

## 2015-06-12 NOTE — Telephone Encounter (Signed)
Notified patient he can hold Plavix 5 days before his ERCP per Dr. Ellyn Hack. Pt verbalized understanding.

## 2015-06-17 ENCOUNTER — Encounter (HOSPITAL_COMMUNITY): Payer: Self-pay | Admitting: *Deleted

## 2015-06-30 ENCOUNTER — Encounter (HOSPITAL_COMMUNITY)
Admission: RE | Disposition: A | Discharge status: Home / Self Care | Payer: Self-pay | Source: Ambulatory Visit | Attending: Gastroenterology

## 2015-06-30 ENCOUNTER — Ambulatory Visit (HOSPITAL_COMMUNITY): Payer: Medicare Other | Admitting: Anesthesiology

## 2015-06-30 ENCOUNTER — Ambulatory Visit (HOSPITAL_COMMUNITY)
Admission: RE | Admit: 2015-06-30 | Discharge: 2015-06-30 | Disposition: A | Discharge status: Home / Self Care | Payer: Medicare Other | Source: Ambulatory Visit | Attending: Gastroenterology | Admitting: Gastroenterology

## 2015-06-30 ENCOUNTER — Encounter (HOSPITAL_COMMUNITY): Payer: Self-pay | Admitting: *Deleted

## 2015-06-30 ENCOUNTER — Ambulatory Visit (HOSPITAL_COMMUNITY): Payer: Medicare Other

## 2015-06-30 DIAGNOSIS — T85590A Other mechanical complication of bile duct prosthesis, initial encounter: Secondary | ICD-10-CM | POA: Insufficient documentation

## 2015-06-30 DIAGNOSIS — K838 Other specified diseases of biliary tract: Secondary | ICD-10-CM | POA: Insufficient documentation

## 2015-06-30 DIAGNOSIS — E1151 Type 2 diabetes mellitus with diabetic peripheral angiopathy without gangrene: Secondary | ICD-10-CM | POA: Diagnosis not present

## 2015-06-30 DIAGNOSIS — G4730 Sleep apnea, unspecified: Secondary | ICD-10-CM | POA: Diagnosis not present

## 2015-06-30 DIAGNOSIS — Z87891 Personal history of nicotine dependence: Secondary | ICD-10-CM | POA: Diagnosis not present

## 2015-06-30 DIAGNOSIS — I2510 Atherosclerotic heart disease of native coronary artery without angina pectoris: Secondary | ICD-10-CM | POA: Insufficient documentation

## 2015-06-30 DIAGNOSIS — I252 Old myocardial infarction: Secondary | ICD-10-CM | POA: Insufficient documentation

## 2015-06-30 DIAGNOSIS — Z9049 Acquired absence of other specified parts of digestive tract: Secondary | ICD-10-CM | POA: Insufficient documentation

## 2015-06-30 DIAGNOSIS — I10 Essential (primary) hypertension: Secondary | ICD-10-CM | POA: Insufficient documentation

## 2015-06-30 DIAGNOSIS — Z4682 Encounter for fitting and adjustment of non-vascular catheter: Secondary | ICD-10-CM | POA: Diagnosis not present

## 2015-06-30 DIAGNOSIS — T85590S Other mechanical complication of bile duct prosthesis, sequela: Secondary | ICD-10-CM

## 2015-06-30 DIAGNOSIS — K8051 Calculus of bile duct without cholangitis or cholecystitis with obstruction: Secondary | ICD-10-CM | POA: Diagnosis not present

## 2015-06-30 DIAGNOSIS — Z955 Presence of coronary angioplasty implant and graft: Secondary | ICD-10-CM | POA: Diagnosis not present

## 2015-06-30 DIAGNOSIS — K8050 Calculus of bile duct without cholangitis or cholecystitis without obstruction: Secondary | ICD-10-CM | POA: Insufficient documentation

## 2015-06-30 HISTORY — PX: SPHINCTEROTOMY: SHX5544

## 2015-06-30 HISTORY — PX: ERCP: SHX5425

## 2015-06-30 LAB — GLUCOSE, CAPILLARY: Glucose-Capillary: 98 mg/dL (ref 65–99)

## 2015-06-30 SURGERY — ERCP, WITH INTERVENTION IF INDICATED
Anesthesia: General

## 2015-06-30 MED ORDER — SODIUM CHLORIDE 0.9 % IV SOLN: INTRAVENOUS | Status: DC

## 2015-06-30 MED ORDER — INDOMETHACIN 50 MG RE SUPP
RECTAL | Status: AC
  Filled 2015-06-30: qty 2

## 2015-06-30 MED ORDER — LIDOCAINE HCL (CARDIAC) 20 MG/ML IV SOLN
INTRAVENOUS | Status: DC | PRN
  Administered 2015-06-30: 50 mg via INTRAVENOUS

## 2015-06-30 MED ORDER — DEXAMETHASONE SODIUM PHOSPHATE 10 MG/ML IJ SOLN
INTRAMUSCULAR | Status: DC | PRN
  Administered 2015-06-30: 10 mg via INTRAVENOUS

## 2015-06-30 MED ORDER — PHENYLEPHRINE 40 MCG/ML (10ML) SYRINGE FOR IV PUSH (FOR BLOOD PRESSURE SUPPORT)
PREFILLED_SYRINGE | INTRAVENOUS | Status: AC
  Filled 2015-06-30: qty 10

## 2015-06-30 MED ORDER — PHENYLEPHRINE HCL 10 MG/ML IJ SOLN
INTRAMUSCULAR | Status: DC | PRN
  Administered 2015-06-30: 80 ug via INTRAVENOUS

## 2015-06-30 MED ORDER — ONDANSETRON HCL 4 MG/2ML IJ SOLN
INTRAMUSCULAR | Status: AC
  Filled 2015-06-30: qty 2

## 2015-06-30 MED ORDER — PROPOFOL 10 MG/ML IV BOLUS
INTRAVENOUS | Status: DC | PRN
  Administered 2015-06-30: 150 mg via INTRAVENOUS

## 2015-06-30 MED ORDER — GLUCAGON HCL RDNA (DIAGNOSTIC) 1 MG IJ SOLR
INTRAMUSCULAR | Status: AC
  Filled 2015-06-30: qty 1

## 2015-06-30 MED ORDER — FENTANYL CITRATE (PF) 100 MCG/2ML IJ SOLN
INTRAMUSCULAR | Status: DC | PRN
  Administered 2015-06-30 (×2): 50 ug via INTRAVENOUS

## 2015-06-30 MED ORDER — FENTANYL CITRATE (PF) 100 MCG/2ML IJ SOLN: INTRAMUSCULAR | Status: DC | PRN

## 2015-06-30 MED ORDER — LIDOCAINE HCL (CARDIAC) 20 MG/ML IV SOLN
INTRAVENOUS | Status: AC
  Filled 2015-06-30: qty 5

## 2015-06-30 MED ORDER — CIPROFLOXACIN IN D5W 400 MG/200ML IV SOLN
INTRAVENOUS | Status: AC
  Filled 2015-06-30: qty 200

## 2015-06-30 MED ORDER — FENTANYL CITRATE (PF) 100 MCG/2ML IJ SOLN
INTRAMUSCULAR | Status: AC
  Filled 2015-06-30: qty 4

## 2015-06-30 MED ORDER — CIPROFLOXACIN IN D5W 400 MG/200ML IV SOLN
400.0000 mg | Freq: Once | INTRAVENOUS | Status: AC
  Administered 2015-06-30: 400 mg via INTRAVENOUS

## 2015-06-30 MED ORDER — PROPOFOL 10 MG/ML IV BOLUS
INTRAVENOUS | Status: AC
  Filled 2015-06-30: qty 20

## 2015-06-30 MED ORDER — ONDANSETRON HCL 4 MG/2ML IJ SOLN
INTRAMUSCULAR | Status: DC | PRN
  Administered 2015-06-30: 4 mg via INTRAVENOUS

## 2015-06-30 MED ORDER — SUCCINYLCHOLINE CHLORIDE 20 MG/ML IJ SOLN
INTRAMUSCULAR | Status: DC | PRN
  Administered 2015-06-30: 100 mg via INTRAVENOUS

## 2015-06-30 MED ORDER — GLUCAGON HCL RDNA (DIAGNOSTIC) 1 MG IJ SOLR
INTRAMUSCULAR | Status: DC | PRN
  Administered 2015-06-30 (×2): 0.25 mg via INTRAVENOUS

## 2015-06-30 MED ORDER — INDOMETHACIN 50 MG RE SUPP
100.0000 mg | Freq: Once | RECTAL | Status: AC
  Administered 2015-06-30: 100 mg via RECTAL

## 2015-06-30 MED ORDER — LACTATED RINGERS IV SOLN
INTRAVENOUS | Status: DC
  Administered 2015-06-30: 1000 mL via INTRAVENOUS

## 2015-06-30 NOTE — Anesthesia Procedure Notes (Signed)
Procedure Name: Intubation Date/Time: 06/30/2015 10:09 AM Performed by: Glory Buff Pre-anesthesia Checklist: Patient identified, Emergency Drugs available, Suction available and Patient being monitored Patient Re-evaluated:Patient Re-evaluated prior to inductionOxygen Delivery Method: Circle System Utilized Preoxygenation: Pre-oxygenation with 100% oxygen Intubation Type: IV induction Ventilation: Mask ventilation without difficulty Grade View: Grade I Tube type: Oral Tube size: 7.5 mm Number of attempts: 1 Airway Equipment and Method: Stylet and Oral airway Placement Confirmation: ETT inserted through vocal cords under direct vision,  positive ETCO2 and breath sounds checked- equal and bilateral Secured at: 21 cm Tube secured with: Tape Dental Injury: Teeth and Oropharynx as per pre-operative assessment

## 2015-06-30 NOTE — Interval H&P Note (Signed)
History and Physical Interval Note:  06/30/2015 8:57 AM  [name]  has presented today for surgery, with the diagnosis of Calculus of bile duct with obstruction  The various methods of treatment have been discussed with the patient and family. After consideration of risks, benefits and other options for treatment, the patient has consented to  Procedure(s): ENDOSCOPIC RETROGRADE CHOLANGIOPANCREATOGRAPHY (ERCP) (N/A) SPHINCTEROTOMY (N/A) as a surgical intervention .  The patient's history has been reviewed, patient examined, no change in status, stable for surgery.  I have reviewed the patient's chart and labs.  Questions were answered to the patient's satisfaction.     Pricilla Riffle. Fuller Plan MD

## 2015-06-30 NOTE — Anesthesia Postprocedure Evaluation (Signed)
  Anesthesia Post-op Note  Patient: [name]  Procedure(s) Performed: Procedure(s) (LRB): ENDOSCOPIC RETROGRADE CHOLANGIOPANCREATOGRAPHY (ERCP) (N/A) SPHINCTEROTOMY (N/A)  Patient Location: PACU  Anesthesia Type: General  Level of Consciousness: awake and alert   Airway and Oxygen Therapy: Patient Spontanous Breathing  Post-op Pain: mild  Post-op Assessment: Post-op Vital signs reviewed, Patient's Cardiovascular Status Stable, Respiratory Function Stable, Patent Airway and No signs of Nausea or vomiting  Last Vitals:  Filed Vitals:   06/30/15 1056  BP: 161/77  Pulse: 77  Temp: 36.3 C  Resp:     Post-op Vital Signs: stable   Complications: No apparent anesthesia complications

## 2015-06-30 NOTE — Discharge Instructions (Signed)
Endoscopic Retrograde Cholangiopancreatography (ERCP), Care After °Refer to this sheet in the next few weeks. These instructions provide you with information on caring for yourself after your procedure. Your health care provider may also give you more specific instructions. Your treatment has been planned according to current medical practices, but problems sometimes occur. Call your health care provider if you have any problems or questions after your procedure.  °WHAT TO EXPECT AFTER THE PROCEDURE  °After your procedure, it is typical to feel:  °· Soreness in your throat.   °· Sick to your stomach (nauseous).   °· Bloated. °· Dizzy.   °· Fatigued. °HOME CARE INSTRUCTIONS °· Have a friend or family member stay with you for the first 24 hours after your procedure. °· Start taking your usual medicines and eating normally as soon as you feel well enough to do so or as directed by your health care provider. °SEEK MEDICAL CARE IF: °· You have abdominal pain.   °· You develop signs of infection, such as:   °¨ Chills.   °¨ Feeling unwell.   °SEEK IMMEDIATE MEDICAL CARE IF: °· You have difficulty swallowing. °· You have worsening throat, chest, or abdominal pain. °· You vomit. °· You have bloody or very black stools. °· You have a fever. °Document Released: 07/11/2013 Document Reviewed: 07/11/2013 °ExitCare® Patient Information ©2015 [employer]. This information is not intended to replace advice given to you by your health care provider. Make sure you discuss any questions you have with your health care provider. ° °

## 2015-06-30 NOTE — Anesthesia Preprocedure Evaluation (Addendum)
Anesthesia Evaluation  Patient identified by MRN, date of birth, ID band Patient awake    Reviewed: Allergy & Precautions, NPO status , Patient's Chart, lab work & pertinent test results  Airway Mallampati: II  TM Distance: >3 FB Neck ROM: Full    Dental no notable dental hx.    Pulmonary sleep apnea , former smoker,    Pulmonary exam normal breath sounds clear to auscultation       Cardiovascular hypertension, Pt. on medications + CAD, + Past MI, + Cardiac Stents and + Peripheral Vascular Disease  Normal cardiovascular exam Rhythm:Regular Rate:Normal     Neuro/Psych negative neurological ROS  negative psych ROS   GI/Hepatic negative GI ROS, Neg liver ROS,   Endo/Other  diabetes  Renal/GU Renal InsufficiencyRenal disease  negative genitourinary   Musculoskeletal negative musculoskeletal ROS (+)   Abdominal   Peds negative pediatric ROS (+)  Hematology  (+) anemia ,   Anesthesia Other Findings   Reproductive/Obstetrics negative OB ROS                            Anesthesia Physical Anesthesia Plan  ASA: III  Anesthesia Plan: General   Post-op Pain Management:    Induction: Intravenous  Airway Management Planned: Oral ETT  Additional Equipment:   Intra-op Plan:   Post-operative Plan: Extubation in OR  Informed Consent: I have reviewed the patients History and Physical, chart, labs and discussed the procedure including the risks, benefits and alternatives for the proposed anesthesia with the patient or authorized representative who has indicated his/her understanding and acceptance.   Dental advisory given  Plan Discussed with: CRNA and Surgeon  Anesthesia Plan Comments:         Anesthesia Quick Evaluation

## 2015-06-30 NOTE — Op Note (Signed)
[facility] [place], [id number]   ERCP PROCEDURE REPORT        EXAM DATE: 06/30/2015  PATIENT NAME:          [name]          MR #: [id number] BIRTHDATE:       Sep 18, 1936     VISIT #:     [phone] ATTENDING:     Ladene Artist, MD, [facility]     STATUS:     outpatient  ASSISTANT:      Elspeth Cho, [place], [place], and [place], [place]   INDICATIONS:  The patient is a 79 yr old male here for an ERCP due to established bile duct stones and biliary stent removal. PROCEDURE PERFORMED:     ERCP with removal of calculus/calculi ERCP with stent removal MEDICATIONS:     Monitored anesthesia care and Per Anesthesia   CONSENT: The patient understands the risks and benefits of the procedure and understands that these risks include, but are not limited to: sedation, allergic reaction, infection, perforation and/or bleeding. Alternative means of evaluation and treatment include, among others: physical exam, x-rays, and/or surgical intervention. The patient elects to proceed with this endoscopic procedure.  DESCRIPTION OF PROCEDURE: During intra-op preparation period all mechanical & medical equipment was checked for proper function. Hand hygiene and appropriate measures for infection prevention was taken. After the risks, benefits and alternatives of the procedure were thoroughly explained, Informed was verified, confirmed and timeout was successfully executed by the treatment team. With the patient in left semi-prone position, medications were administered intravenously.The    was passed from the mouth into the esophagus and further advanced from the esophagus into the stomach. From stomach scope was directed to the second portion of the duodenum. Major papilla was aligned with the duodenoscope. The scope position was confirmed fluoroscopically. Rest of the findings/therapeutics are given below. The scope was then completely withdrawn from  the patient and the procedure completed. The pulse, BP, and O2 saturation were monitored and documented by the physician and the nursing staff throughout the entire procedure. The patient was cared for as planned according to standard protocol. The patient was then discharged to recovery in stable condition and with appropriate post procedure care. Estimated blood loss is zero unless otherwise noted in this procedure report.  Covered metal stent at the ampulla was removed without difficulty. CBD cannulated freely and guidewire inserted. The ampulla was mildy dilated from the stent and a sphincterotomy was not necessary. Sludge and multiple stones were seen in the common bile duct. Using a stone extraction balloon the bile duct was swept several times. Sludge and multiple stones were removed from the bile duct successfully.  There was mild dilation of the CBD noted.  The intrahepatic biliary tree appeared normal with no evidence of stricture or dilation.  Following balloon catheter sweeping there were no residual filling defects noted in the CBD. Prior cholecystectomy noted. The PD was not cannulated or injected by intention. There was good biliary drainage at the end of the procedure.    ADVERSE EVENT:     There were no complications. IMPRESSIONS:     1.  Covered metal stent at the ampulla removed without difficulty 2.  Sludge and multiple stones removed by balloon extraction from the CBD 3.  Mildly dilated CBD  RECOMMENDATIONS:     Liver enzymes in 4-6 weeks Resume Plavix tomorrow   Ladene Artist, MD, [facility] eSigned:  Pricilla Riffle  Fuller Plan, MD, [facility] 06/30/2015 10:55 AM

## 2015-06-30 NOTE — Transfer of Care (Signed)
Immediate Anesthesia Transfer of Care Note  Patient: [name]  Procedure(s) Performed: Procedure(s): ENDOSCOPIC RETROGRADE CHOLANGIOPANCREATOGRAPHY (ERCP) (N/A) SPHINCTEROTOMY (N/A)  Patient Location: PACU  Anesthesia Type:General  Level of Consciousness: awake, alert  and oriented  Airway & Oxygen Therapy: Patient Spontanous Breathing and Patient connected to face mask oxygen  Post-op Assessment: Report given to RN and Post -op Vital signs reviewed and stable  Post vital signs: Reviewed and stable  Last Vitals:  Filed Vitals:   06/30/15 0843  BP: 125/68  Pulse: 68  Temp: 36.5 C  Resp: 22    Complications: No apparent anesthesia complications

## 2015-06-30 NOTE — H&P (View-Only) (Signed)
    History of Present Illness: This is a 79 year old male returning for follow-up of bile duct stones and a biliary stent placed in 2015 for temporary treatment prevent obstruction from CBD stones. He is accompanied by his wife. He was not a sphincterotomy or surgical candidate for the past year due to Plavix requirement from cardiac stent placement in 05/2014. He was hospitalized 3 weeks ago for severe epigastric pain and diagnosed with acute cholecystitis and he underwent cholecystectomy with IOC. He has recovered from his cholecystectomy except for some mild incisional area soreness. He has been released by Dr. Lucia Gaskins.   --07/03/14 ERCP #1: Metal stent placement. Developed pancreatitis, cholangitis, stone occlusion of stent post-op  --07/05/14 ERCP # 2: Stent replaced, bile duct swept of stones and pus. --05/23/15 Cholecystectomy for acute cholecystitis. IOC showed CBD stones vs sludge and biliary stent.    Current Medications, Allergies, Past Medical History, Past Surgical History, Family History and Social History were reviewed in [employer] record.  Physical Exam: General: Well developed , well nourished, no acute distress Head: Normocephalic and atraumatic Eyes:  sclerae anicteric, EOMI Ears: Normal auditory acuity Mouth: No deformity or lesions Lungs: Clear throughout to auscultation Heart: Regular rate and rhythm; no murmurs, rubs or bruits Abdomen: Soft, non tender and non distended. No masses, hepatosplenomegaly or hernias noted. Normal Bowel sounds Musculoskeletal: Symmetrical with no gross deformities  Pulses:  Normal pulses noted Extremities: No clubbing, cyanosis, edema or deformities noted Neurological: Alert oriented x 4, grossly nonfocal Psychological:  Alert and cooperative. Normal mood and affect  Assessment and Recommendations:  1. Choledocholithiasis versus sludge with indwelling biliary stent. Schedule ERCP for stent, stone removal and possible  sphincterotomy. The risks benefits and alternatives to ERCP discussed with the patient and he consents to proceed. Risks include but are not limited to pancreatitis, infection, perforation, sedation related complications.  2. Coronary artery disease status post stent placement in August 2015. He is now over 1 year out from his stent. Hold Plavix 5 days before procedure - will instruct when and how to resume after procedure. Low but real risk of cardiovascular event such as heart attack, stroke, embolism, thrombosis or ischemia/infarct of other organs off Plavix explained and need to seek urgent help if this occurs. The patient consents to proceed. Will communicate by phone or EMR with patient's prescribing provider to confirm that holding Plavix is reasonable in this case.   3. Status post cholecystectomy for acute cholecystitis in August 2016.

## 2015-07-01 ENCOUNTER — Encounter (HOSPITAL_COMMUNITY): Payer: Self-pay | Admitting: Gastroenterology

## 2015-07-02 ENCOUNTER — Ambulatory Visit: Payer: Medicare Other | Admitting: Gastroenterology

## 2015-07-05 DIAGNOSIS — I350 Nonrheumatic aortic (valve) stenosis: Secondary | ICD-10-CM

## 2015-07-05 HISTORY — DX: Nonrheumatic aortic (valve) stenosis: I35.0

## 2015-07-21 ENCOUNTER — Encounter: Payer: Self-pay | Admitting: Vascular Surgery

## 2015-07-22 ENCOUNTER — Encounter: Payer: Self-pay | Admitting: Vascular Surgery

## 2015-07-22 ENCOUNTER — Ambulatory Visit (INDEPENDENT_AMBULATORY_CARE_PROVIDER_SITE_OTHER): Payer: Medicare Other | Admitting: Vascular Surgery

## 2015-07-22 VITALS — BP 123/68 | HR 66 | Temp 97.8°F | Resp 16 | Ht 69.0 in | Wt 209.0 lb

## 2015-07-22 DIAGNOSIS — I2510 Atherosclerotic heart disease of native coronary artery without angina pectoris: Secondary | ICD-10-CM

## 2015-07-22 DIAGNOSIS — Z9861 Coronary angioplasty status: Secondary | ICD-10-CM

## 2015-07-22 DIAGNOSIS — I714 Abdominal aortic aneurysm, without rupture, unspecified: Secondary | ICD-10-CM

## 2015-07-22 NOTE — Progress Notes (Signed)
Subjective:     Patient ID: [name], male   DOB: 08/06/1936, 79 y.o.   MRN: [id number]  HPI this 79-year-old male was referred by Dr. David Harding for evaluation of enlarging abdominal aortic aneurysm. Patient has had this aneurysm followed over the past few years and it has enlarged from 4 cm to its current diameter of 4.9-5 cm by recent CT scan and ultrasound. In July of this year it measured 4.4 x 4.5 cm. Patient does have a history of coronary artery disease with previous coronary artery bypass grafting in 1997 and has had stents placed in the interim. He was seen at Dr. Harding's office 06/10/2015 and is currently doing well with an ejection fraction of 45%. He denies active chest pain or cardiac symptoms. He did have a recent cholecystectomy performed by Dr. David Newman and required a biliary stent and removal of a stone by Dr. Stark. Currently he is doing well.  Past Medical History  Diagnosis Date  . History of: ST elevation myocardial infarction (STEMI) involving left circumflex coronary artery with complication 1985    PTCA-circumflex; PCI in 1991  . CAD, multiple vessel 1985-2010    Most recent cath 01/01/09: 100% Occluded LAD after SP1, patent LIMA-distal LAD with apical 90% lesion.  Cx-OM1 widely patent stent into proximal bifurcating OM1 . Follow-on Cx => 70-80% -- non-amenable PCI. Widely patent 3 overlapping stents in midRCA with only 40% RPL stenosis; SVG- OM known to be occluded.  . S/P CABG x 2 1997    LIMA-LAD, SVG-OM  . H/O unstable angina 06/2003, 12/2006, 05/2014    a) '04: Staged Taxus DES PCI to RCA and Cx-OM;;'08 - PCI to proximal ISR in RCA - Cypher DES; c) 05/2014: ISR in RCA & OM1, PCI with Promus P DES: RCA  2.75 x 12, OM1 3.0 mm x 8)  . History of nuclear stress test December 2013    LOW at Risk. Moderate region of mid to basal inferolateral scar without ischemia -- consistent with distal circumflex disease. Mild apical hypokinesis with an EF of 47%.  . Irregular  heart beat     PVCs and PACs, no arrhythmia recorded  . Exertional dyspnea     Chronic baseline SOB with ambulation  . Diabetes mellitus     Not on oral medication  . Hypertension   . Dyslipidemia, goal LDL below 70   . Osteoarthritis of both knees     And back; multiple back surgeries, right knee arthroplasty and left knee arthroscopic surgery x2  . Chronic back pain      multiple surgeries; C-spine and lumbar  . Erectile dysfunction   . H/O: pneumonia February 14  . Chronic anemia     On iron supplement; history of positive guaiac - negative colonoscopy in 1996.; Thought to be related to hemorrhoids; status post hemorrhoidectomy  . Ankle edema     Chronic  . Diverticulitis of colon 1996  . Choledocholithiasis   . Hemorrhoids   . Diverticulosis   . Adenomatous colon polyp   . Hiatal hernia   . Cholelithiasis - with cholangitis     status post ERCP with removal of calculi and biliary stent placement.  . Sleep apnea     pt. states he was told to return for f/u, to be fitted for Cpap, but pt. reports that he didn't follow up-no cpap used  . AAA (abdominal aortic aneurysm) (HCC) 08/30/12    Doppler 08/30/12 had a small amount of growth. It   was a 4.2 x 4.3 cm greater in size than the previous one noted at 4 x 3.8.  05-21-15 followed by CT abd-Dr. Lawson.    Social History  Substance Use Topics  . Smoking status: Former Smoker    Types: Cigarettes    Quit date: 10/05/1983  . Smokeless tobacco: Never Used  . Alcohol Use: 1.8 oz/week    3 Cans of beer per week     Comment: 3 or more cans of beer per day - 05/21/15    Family History  Problem Relation Age of Onset  . Arthritis Mother   . Diabetes Father   . Heart disease Father   . Stroke Sister   . Hypertension Sister   . Heart disease Sister   . Diabetes Sister   . Breast cancer Sister   . Heart disease Brother   . Ulcers Brother   . Colon cancer Neg Hx     Allergies  Allergen Reactions  . Lisinopril Cough    Rxn:  unknown  . Statins Other (See Comments)    Muscle ache  . Welchol [Colesevelam Hcl] Itching     Current outpatient prescriptions:  .  acetaminophen (TYLENOL) 500 MG tablet, Take 1,000 mg by mouth every 6 (six) hours as needed for mild pain, moderate pain or headache., Disp: , Rfl:  .  amLODipine (NORVASC) 5 MG tablet, TAKE 5 MG BY MOUTH DAILY, Disp: 90 tablet, Rfl: 3 .  aspirin 81 MG tablet, Take 81 mg by mouth daily., Disp: , Rfl:  .  Choline Fenofibrate (FENOFIBRIC ACID) 135 MG CPDR, TAKE ONE CAPSULE BY MOUTH ONCE DAILY (Patient taking differently: TAKE 135 MG BY MOUTH DAILY), Disp: 90 capsule, Rfl: 3 .  clopidogrel (PLAVIX) 75 MG tablet, TAKE ONE TABLET BY MOUTH ONCE DAILY (Patient taking differently: TAKE 75 MG BY MOUTH ONCE DAILY), Disp: 90 tablet, Rfl: 3 .  ezetimibe (ZETIA) 10 MG tablet, Take 1 tablet (10 mg total) by mouth daily., Disp: 90 tablet, Rfl: 2 .  ferrous sulfate 325 (65 FE) MG tablet, Take 325 mg by mouth daily with breakfast., Disp: , Rfl:  .  losartan (COZAAR) 50 MG tablet, TAKE ONE TABLET BY MOUTH ONCE DAILY, Disp: 90 tablet, Rfl: 0 .  Multiple Vitamins-Minerals (MULTIVITAMIN WITH MINERALS) tablet, Take 1 tablet by mouth daily. , Disp: , Rfl:  .  pantoprazole (PROTONIX) 40 MG tablet, TAKE ONE TABLET BY MOUTH ONCE DAILY (Patient taking differently: TAKE 40 MG BY MOUTH DAILY), Disp: 90 tablet, Rfl: 3 .  pioglitazone-metformin (ACTOPLUS MET) 15-500 MG per tablet, TAKE ONE TABLET BY MOUTH ONCE DAILY, Disp: 90 tablet, Rfl: 0 .  sildenafil (REVATIO) 20 MG tablet, Take 2-5 pills daily as needed (Patient taking differently: Take 40-100 mg by mouth daily as needed (erectile dysfunction). Take 2-5 pills daily as needed), Disp: 50 tablet, Rfl: 5  Filed Vitals:   07/22/15 1005  BP: 123/68  Pulse: 66  Temp: 97.8 F (36.6 C)  Resp: 16  Height: 5' 9" (1.753 m)  Weight: 209 lb (94.802 kg)  SpO2: 100%    Body mass index is 30.85 kg/(m^2).           Review of Systems  denies chest pain, PND, orthopnea. Does have occasional dyspnea on exertion but denies claudication type symptoms. Has left knee pain due to arthritis. Other systems negative and a complete review of systems other than history of present illness     Objective:   Physical Exam BP 123/68 mmHg    Pulse 66  Temp(Src) 97.8 F (36.6 C)  Resp 16  Ht 5' 9" (1.753 m)  Wt 209 lb (94.802 kg)  BMI 30.85 kg/m2  SpO2 100%  Gen.-alert and oriented x3 in no apparent distress HEENT normal for age Lungs no rhonchi or wheezing Cardiovascular regular rhythm no murmurs carotid pulses 3+ palpable no bruits audible Abdomen soft nontender-5 cm pulsatile mass noted Musculoskeletal free of  major deformities Skin clear -no rashes Neurologic normal Lower extremities 3+ femoral and dorsalis pedis pulses palpable on the right 3+ femoral and posterior tibial pulse palpable on the left Popliteal pulses palpable easily bilaterally  Today I reviewed the ultrasound report from 04/18/2015 which revealed the aneurysm to be 4.4 x 4.5 cm. I also reviewed the CT scan and the images by computer and measured the aneurysm which measures approximately 5 cm in maximum diameter and has a long infrarenal neck. The aneurysm is quite saccular in configuration.       Assessment:     #1 enlarging infrarenal abdominal aortic aneurysm currently measuring approximately 5 cm in maximum diameter which is quite saccular in nature-has enlarged from 4.5 cm in the past few months #2 coronary artery disease currently stable with history of coronary artery bypass grafting and stent placement-followed by Dr. Glenetta Hew #3 history of recent cholecystectomy with removal of stone and biliary stent #4 hypertension well controlled  I discussed the aneurysm and its dimensions with the patient and his wife and he would like to proceed with aortic stent grafting or open repair if needed. I think the patient is an excellent The for aortic stent  grafting    Plan:     Will refer back to Dr. Glenetta Hew for cardiac clearance although I do not think open repair will be required Have tentatively scheduled aortic stent graft repair on Wednesday, November 9 Risks and benefits thoroughly discussed with patient and his wife and they would like to proceed Will await Dr. Allison Quarry comments regarding cardiac risk

## 2015-07-23 ENCOUNTER — Ambulatory Visit (INDEPENDENT_AMBULATORY_CARE_PROVIDER_SITE_OTHER): Payer: Medicare Other | Admitting: Cardiology

## 2015-07-23 ENCOUNTER — Encounter: Payer: Self-pay | Admitting: Cardiology

## 2015-07-23 VITALS — BP 160/80 | HR 66 | Ht 69.0 in | Wt 210.0 lb

## 2015-07-23 DIAGNOSIS — T82857D Stenosis of cardiac prosthetic devices, implants and grafts, subsequent encounter: Secondary | ICD-10-CM | POA: Diagnosis not present

## 2015-07-23 DIAGNOSIS — Z9861 Coronary angioplasty status: Secondary | ICD-10-CM | POA: Diagnosis not present

## 2015-07-23 DIAGNOSIS — R0609 Other forms of dyspnea: Secondary | ICD-10-CM

## 2015-07-23 DIAGNOSIS — I2119 ST elevation (STEMI) myocardial infarction involving other coronary artery of inferior wall: Secondary | ICD-10-CM

## 2015-07-23 DIAGNOSIS — Z01810 Encounter for preprocedural cardiovascular examination: Secondary | ICD-10-CM

## 2015-07-23 DIAGNOSIS — I10 Essential (primary) hypertension: Secondary | ICD-10-CM

## 2015-07-23 DIAGNOSIS — I2510 Atherosclerotic heart disease of native coronary artery without angina pectoris: Secondary | ICD-10-CM

## 2015-07-23 DIAGNOSIS — E785 Hyperlipidemia, unspecified: Secondary | ICD-10-CM

## 2015-07-23 DIAGNOSIS — E1159 Type 2 diabetes mellitus with other circulatory complications: Secondary | ICD-10-CM | POA: Diagnosis not present

## 2015-07-23 DIAGNOSIS — I5032 Chronic diastolic (congestive) heart failure: Secondary | ICD-10-CM

## 2015-07-23 DIAGNOSIS — T82855D Stenosis of coronary artery stent, subsequent encounter: Secondary | ICD-10-CM

## 2015-07-23 NOTE — Assessment & Plan Note (Signed)
No active heart failure symptoms. He is still not on diuretic. He is on ARB for afterload reduction. Plan is to recheck echocardiogram to reassess systolic as well as diastolic function and filling pressures.

## 2015-07-23 NOTE — Assessment & Plan Note (Signed)
The patient does have a history of coronary artery disease that has been revascularized with no active symptoms of angina.  He does have chronic diastolic dysfunction with mild systolic dysfunction, no active heart failure. He has diabetes, and is on insulin. No history of cerebrovascular disease.  Endovascularly, this procedure is relatively low risk from a cardiac standpoint -- however, if done open, a AAA repair is a very high-risk procedure her cardiac standpoint.  PREOPERATIVE CARDIAC RISK ASSESSMENT   Revised Cardiac Risk Index:  High Risk Surgery: no; unless converted to OPEN  Defined as Intraperitoneal, intrathoracic or suprainguinal vascular  Active CAD: no; no active angina (following PCI > 1 yr ago)  CHF: no; No CHF Sx despite mild EF reduction & diastolic dysfunction (no diuretic required)  Cerebrovascular Disease: no;   Diabetes: yes; On Insulin: no  CKD (Cr >~ 2): no  Total: 0 Estimated Risk of Adverse Outcome: <1% (however with borderline other RFs - would consider potentially at least one point and therefore 1-3 % risk Estimated Risk of MI, PE, VF/VT (Cardiac Arrest), Complete Heart Block: <3 %   ACC/AHA Guidelines for "Clearance":  Step 1 - Need for Emergency Surgery: No: not emergent  If Yes - go straight to OR with perioperative surveillance  Step 2 - Active Cardiac Conditions (Unstable Angina, Decompensated HF, Significant  Arrhytmias - Complete HB, Mobitz II, Symptomatic VT or SVT, Severe Aortic Stenosis - mean gradient > 40 mmHg, Valve area < 1.0 cm2):   No: no  If Yes - Evaluate & Treat per ACC/AHA Guidelines  Step 3 -  Low Risk Surgery: No: not if Open  If Yes --> proceed to OR  If No --> Step 4  Step 4 - Functional Capacity >= 4 METS without symptoms: Yes  If Yes --> proceed to OR  If No --> Step 5  Step 5 --  Clinical Risk Factors (CRF)   3 or more: Yes  If Yes -- assess Surgical Risk, --  (High Risk Non-cardiac), Intraabdominal or  thoracic vascular surgery consider testing if it will change management.  Intermediate Risk: Proceed to OR with HR control, or consider testing if it will change management  In the absence of active CAD/anginal symptoms or heart failure symptoms, I would not otherwise evaluate for cardiac standpoint with any type of stress test. He did have a mildly reduced ejection fraction by left ventriculography last year and not followed up with an echocardiogram.. I would like to reassess an echocardiogram as this has not been done. This would be mostly from instrumentation and not for clearance.

## 2015-07-23 NOTE — Assessment & Plan Note (Signed)
He did have evidence of infarct or Myoview in 2013. Would expect to have inferolateral wall motion abnormality on echocardiogram.  Would expect it to be somewhere between 40-50% (likely 45%).  Plan: Check 2-D echocardiogram to ensure no new wall motion abnormalities or abnormalities noted. This will also get baseline pressures.

## 2015-07-23 NOTE — Assessment & Plan Note (Signed)
Not currently on statin. He is on Zetia. Once we get him through his AAA procedure, we can referred for evaluation for starting Repatha.

## 2015-07-23 NOTE — Progress Notes (Signed)
PCP: Carollee Herter, MD  Clinic Note: Chief Complaint  Patient presents with  . Advice Only    here to get surgcal clearance. surgery scheduled on 08/13/15  . Pre-op Exam    HPI: [name] is a 79 y.o. male with a PMH below who presents today for Pre-Operative Cardiovascular Evaluation for Planned AAA repaitn by Dr. Hart Rochester.  He has a long-standing history of CAD dating back to 22. Most recent cardiac evaluation and treatment was on for her staged PCI back in August of 2015 weighted PCI to in-stent restenosis in the RCA as well as OM1 in both areas treated with DES stents. He then had a cardiac catheterization shortly thereafter showing widely patent stents and grafts.  He is intolerant of statin. Currently on Zetia plus fenofibrate with consideration for possible conversion to PC SK 9 inhibitor.  [name] was last seen on 01/06/2015 for preoperative evaluation for cholecystectomy for biliary colic.  He was doing well at that time without any potential symptoms of cardiac issues. He did well with this surgery (surgery was on August 19) without any cardiac issues.    Recent Hospitalizations:  Cholecystectomy in August  He saw Wilburt Finlay, [place] following his hospital stay, continued to do well without any additional cardiac symptoms. Prior to his operation he had repeat abdominal aortic Doppler performed that revealed worsening AAA. He was therefore referred to see Dr. Hart Rochester from vascular surgery.  He saw Dr. Hart Rochester yesterday (10/18) -- with an enlarging infrarenal abdominal aortic aneurysm that was now measuring approximately 5 cm in diameter, discussed recommendations for AAA repair. Preferred option would be an endovascular EVAR on 08/13/15.    Studies Reviewed:   Abdominal Aorta doppler (04/18/2015): distal normal aorta has an irregular fusiform aneurysm with current measurements of 4.4 x 4.5 mm  Interval History:  [name] presents really without any major complaints or  cardiac standpoint. He still has dyspnea with overexertion with rushing up a flight of steps or hills. This has been stable for him since prior to his CABG in 1985. He has not had any exacerbation or worsening of his dyspnea and, nor has he had any chest tightness or pressure with rest or exertion. He is relatively cooperative but feels any symptoms and is not having these symptoms since his PCI last year. He has mild loss of edema in the ankles that is chronic and better when he elevates his legs. No PND or orthopnea to suggest heart failure symptoms.  No rapid/irregular heartbeat/palpitations. No tachyarrhythmia type symptoms. He is not on a beta blocker since his hospital stay last year due to bradycardia. Note TIA/amaurosis fugax or syncope/near syncope symptoms.  ROS: A comprehensive was performed. Review of Systems  Constitutional: Negative for malaise/fatigue.  HENT: Negative for nosebleeds.   Cardiovascular: Negative for claudication.  Gastrointestinal: Negative for heartburn, abdominal pain, blood in stool and melena.  Genitourinary: Negative for hematuria.  Musculoskeletal: Negative for myalgias.  Neurological: Negative for dizziness and weakness.  Endo/Heme/Allergies: Does not bruise/bleed easily.  All other systems reviewed and are negative.    Past Medical History  Diagnosis Date  . History of: ST elevation myocardial infarction (STEMI) involving left circumflex coronary artery with complication 1985    PTCA-circumflex; PCI in 1991  . CAD, multiple vessel 1985-2010    Most recent cath 01/01/09: 100% Occluded LAD after SP1, patent LIMA-distal LAD with apical 90% lesion.  Cx-OM1 widely patent stent into proximal bifurcating OM1 . Follow-on Cx => 70-80% --  non-amenable PCI. Widely patent 3 overlapping stents in midRCA with only 40% RPL stenosis; SVG- OM known to be occluded.  . S/P CABG x 2 1997    LIMA-LAD, SVG-OM  . H/O unstable angina 06/2003, 12/2006, 05/2014    a) '04: Staged  Taxus DES PCI to RCA and Cx-OM;;'08 - PCI to proximal ISR in RCA - Cypher DES; c) 05/2014: ISR in RCA & OM1, PCI with Promus P DES: RCA  2.75 x 12, OM1 3.0 mm x 8)  . History of nuclear stress test December 2013    LOW at Risk. Moderate region of mid to basal inferolateral scar without ischemia -- consistent with distal circumflex disease. Mild apical hypokinesis with an EF of 47%.  . Irregular heart beat     PVCs and PACs, no arrhythmia recorded  . Exertional dyspnea     Chronic baseline SOB with ambulation  . Diabetes mellitus     Not on oral medication  . Hypertension   . Dyslipidemia, goal LDL below 70   . Osteoarthritis of both knees     And back; multiple back surgeries, right knee arthroplasty and left knee arthroscopic surgery x2  . Chronic back pain      multiple surgeries; C-spine and lumbar  . Erectile dysfunction   . H/O: pneumonia February 14  . Chronic anemia     On iron supplement; history of positive guaiac - negative colonoscopy in 1996.; Thought to be related to hemorrhoids; status post hemorrhoidectomy  . Ankle edema     Chronic  . Diverticulitis of colon 1996  . Choledocholithiasis   . Hemorrhoids   . Diverticulosis   . Adenomatous colon polyp   . Hiatal hernia   . Cholelithiasis - with cholangitis     status post ERCP with removal of calculi and biliary stent placement.  . Sleep apnea     pt. states he was told to return for f/u, to be fitted for Cpap, but pt. reports that he didn't follow up-no cpap used  . AAA (abdominal aortic aneurysm) ([place]) 08/30/12    Doppler 08/30/12 had a small amount of growth. It was a 4.2 x 4.3 cm greater in size than the previous one noted at 4 x 3.8.  05-21-15 followed by CT abd-Dr. Kellie Simmering.    Past Surgical History  Procedure Laterality Date  . Coronary angioplasty  [id number],[id number]    1985 lateral STEMI Circumflex PTCA  . Anterior lat lumbar fusion Left 11/22/2012    Procedure: ANTERIOR LATERAL LUMBAR FUSION 1 LEVEL;  Surgeon: Eustace Moore, MD;  Location: [place];  Service: Neurosurgery;  Laterality: Left;  Anterior Lateral Lumbar Fusion Lumbar Three-Four  . Lumbar percutaneous pedicle screw 1 level N/A 11/22/2012    Procedure: LUMBAR PERCUTANEOUS PEDICLE SCREW 1 LEVEL;  Surgeon: Eustace Moore, MD;  Location: [place] [facility];  Service: Neurosurgery;  Laterality: N/A;  Lumbar Three-Four Percutaneous Pedicle Screw, Lateral approach  . Back surgery  1979 & x 10    pt. remarks, "I have had about 10 back surgeries"  . Posterior cervical fusion/foraminotomy N/A 05/02/2013    Procedure: POSTERIOR CERVICAL FUSION/FORAMINOTOMY CERVICAL SEVEN THORACIC-ONE;  Surgeon: Eustace Moore, MD;  Location: [facility];  Service: Neurosurgery;  Laterality: N/A;  POSTERIOR CERVICAL FUSION/FORAMINOTOMY CERVICAL SEVEN THORACIC-ONE  . Coronary artery bypass graft  1990    LIMA-LAD, SVG-OM  . Cardiac catheterization  September 2004    None Occluded vein graft to OM; diffuse RCA disease in the  mid vessel, 80% circumflex-OM stenosis; follow on AV groove circumflex with sequential 90% stenoses and intervening saccular dilation   . Percutaneous coronary stent intervention (pci-s)  September 2004    PCI - RCA 2 overlapping Taxus DES 2.75 mm x 32 mm and 2.75 mm x 12 mm (3.0 mm); PCI-Cx-OM1 - Taxus DES 3.0 mm x 20 mm (3.1 mm);   Marland Kitchen Percutaneous coronary stent intervention (pci-s)  March 2008    80% ISR in proximal Taxus stent in RCA -- covered proximally with Cypher DES 3.0 mm x 12 mm  . Cardiac catheterization  March 2010    4 abnormal Myoview showing apical thinning (possibly due to apical LAD 95%) : 100% Occluded LAD after SV1, distal LAD grafted via LIMA -apical 95% . Cx -OM1 w/patent stent extending into OM 1 . Follow on Cx - 70-80% - non-amenable PCI. RCA widely patent 3 overlapping stents in mRCA w/less than 40% stenosisin RPL; SVG-OM known occluded   . Cervical discectomy  04/23/10    Decompressive anterior carvical diskectomy. C4-5, C6-7  . Cervical  arthrodesis  04/23/10    Anterior cervical arthrodesis, C4-5, C6-7 utilizing 7-mm PEEK interbody cage packed with local autograft & Antifuse putty at C4-5 & an 8-mm cage at C6-7.  Marland Kitchen Anterior cervical plating  04/23/10    At C4-5 and a C6-7 utilizing two separate Biomet MaxAn plates.  . Colonoscopy  1996  . Minor hemorrhoidectomy    . Total knee arthroplasty Right   . Knee arthroscopy Left     x 2  . Nm myoview ltd  December 2013    LOW RISK. Mmoderate region of mid to basal inferolateral scar without ischemia. Mild apical hypokinesis with an EF of 47%.  . Transthoracic echocardiogram  December 2013    EF 55-60%. moderate LA dilation. Aortic Sclerosis  . Abdominal and lower extremity arterial ultrasound  08/23/2012; 10/10/2013    Normal ABIs. Nonocclusive lower extremity disease. 4.2 cm x 4.3 cm infrarenal AAA;; 4.4 cm x 4.3 cm (essentially stable)   . Cataract extraction    . Ercp N/A 07/03/2014    Procedure: ENDOSCOPIC RETROGRADE CHOLANGIOPANCREATOGRAPHY (ERCP);  Surgeon: Inda Castle, MD;  Location: [facility];  Service: Endoscopy;  Laterality: N/A;  . Biliary stent placement N/A 07/03/2014    Procedure: BILIARY STENT PLACEMENT;  Surgeon: Inda Castle, MD;  Location: [facility];  Service: Endoscopy;  Laterality: N/A;  . Ercp N/A 07/05/2014    Procedure: ENDOSCOPIC RETROGRADE CHOLANGIOPANCREATOGRAPHY (ERCP);  Surgeon: Inda Castle, MD;  Location: [facility];  Service: Endoscopy;  Laterality: N/A;  . Left heart catheterization with coronary angiogram N/A 05/15/2014    Procedure: LEFT HEART CATHETERIZATION WITH CORONARY ANGIOGRAM;  Surgeon: Sinclair Grooms, MD;  Location: [facility] C[facility] LAB;  Service: Cardiovascular;  Laterality: N/A;  . Percutaneous coronary stent intervention (pci-s) N/A 05/17/2014    Procedure: PERCUTANEOUS CORONARY STENT INTERVENTION (PCI-S);  Surgeon: Sinclair Grooms, MD;  Location: [facility] CATH LAB;  Service: Cardiovascular;  Laterality: N/A;  . Left heart  catheterization with coronary/graft angiogram N/A 06/28/2014    Procedure: LEFT HEART CATHETERIZATION WITH Beatrix Fetters;  Surgeon: Troy Sine, MD;  Location: [facility] CATH LAB;  Service: Cardiovascular;  Laterality: N/A;  . Cholecystectomy N/A 05/23/2015    Procedure: LAPAROSCOPIC CHOLECYSTECTOMY WITH INTRAOPERATIVE CHOLANGIOGRAM;  Surgeon: Alphonsa Overall, MD;  Location: [facility];  Service: General;  Laterality: N/A;  . Ercp N/A 06/30/2015    Procedure: ENDOSCOPIC RETROGRADE CHOLANGIOPANCREATOGRAPHY (ERCP);  Surgeon: Norberto Sorenson  Sindy Guadeloupe, MD;  Location: [facility];  Service: Endoscopy;  Laterality: N/A;  . Sphincterotomy N/A 06/30/2015    Procedure: SPHINCTEROTOMY;  Surgeon: Ladene Artist, MD;  Location: [facility];  Service: Endoscopy;  Laterality: N/A;    Prior to Admission medications   Medication Sig Start Date End Date Taking? Authorizing Provider  acetaminophen (TYLENOL) 500 MG tablet Take 1,000 mg by mouth every 6 (six) hours as needed for mild pain, moderate pain or headache.   Yes Historical Provider, MD  amLODipine (NORVASC) 5 MG tablet TAKE 5 MG BY MOUTH DAILY 06/10/15  Yes Brett Canales, PA-C  aspirin 81 MG tablet Take 81 mg by mouth daily.   Yes Historical Provider, MD  Choline Fenofibrate (FENOFIBRIC ACID) 135 MG CPDR TAKE ONE CAPSULE BY MOUTH ONCE DAILY Patient taking differently: TAKE 135 MG BY MOUTH DAILY 01/20/15  Yes Leonie Man, MD  clopidogrel (PLAVIX) 75 MG tablet TAKE ONE TABLET BY MOUTH ONCE DAILY Patient taking differently: TAKE 75 MG BY MOUTH ONCE DAILY 01/10/15  Yes Leonie Man, MD  ezetimibe (ZETIA) 10 MG tablet Take 1 tablet (10 mg total) by mouth daily. 11/11/14  Yes Leonie Man, MD  ferrous sulfate 325 (65 FE) MG tablet Take 325 mg by mouth daily with breakfast.   Yes Historical Provider, MD  losartan (COZAAR) 50 MG tablet TAKE ONE TABLET BY MOUTH ONCE DAILY 06/10/15  Yes Denita Lung, MD  Multiple Vitamins-Minerals (MULTIVITAMIN WITH MINERALS) tablet Take  1 tablet by mouth daily.    Yes Historical Provider, MD  pantoprazole (PROTONIX) 40 MG tablet TAKE ONE TABLET BY MOUTH ONCE DAILY Patient taking differently: TAKE 40 MG BY MOUTH DAILY 02/04/15  Yes Leonie Man, MD  pioglitazone-metformin (ACTOPLUS MET) 15-500 MG per tablet TAKE ONE TABLET BY MOUTH ONCE DAILY 05/02/15  Yes Denita Lung, MD  sildenafil (REVATIO) 20 MG tablet Take 2-5 pills daily as needed Patient taking differently: Take 40-100 mg by mouth daily as needed (erectile dysfunction). Take 2-5 pills daily as needed 10/08/14  Yes Denita Lung, MD   Allergies  Allergen Reactions  . Lisinopril Cough    Rxn: unknown  . Statins Other (See Comments)    Muscle ache  . Welchol [Colesevelam Hcl] Itching     Social History   Social History  . Marital Status: Married    Spouse Name: N/A  . Number of Children: N/A  . Years of Education: N/A   Social History Main Topics  . Smoking status: Former Smoker    Types: Cigarettes    Quit date: 10/05/1983  . Smokeless tobacco: Never Used  . Alcohol Use: 1.8 oz/week    3 Cans of beer per week     Comment: 3 or more cans of beer per day - 05/21/15  . Drug Use: No  . Sexual Activity: Not Currently   Other Topics Concern  . None   Social History Narrative   He is married, father of two, grandfather to 72, great grandfather to two.    Not really getting much exercise now, do to his significant back and hip pain.    He does not smoke and only has an alcoholic beverage.    Family History  Problem Relation Age of Onset  . Arthritis Mother   . Diabetes Father   . Heart disease Father   . Stroke Sister   . Hypertension Sister   . Heart disease Sister   . Diabetes Sister   .  Breast cancer Sister   . Heart disease Brother   . Ulcers Brother   . Colon cancer Neg Hx      Wt Readings from Last 3 Encounters:  07/23/15 210 lb (95.255 kg)  07/22/15 209 lb (94.802 kg)  06/30/15 200 lb (90.719 kg)    PHYSICAL EXAM BP 160/80  mmHg  Pulse 66  Ht _0  (1.753 m)  Wt 210 lb (95.255 kg)  BMI 31.00 kg/m2 General appearance: A&Ox3, cooperative, appears stated age, NAD. Pleasant mood and affect, answers questions appropriately.  HEENT: /AT, EOMI, MMM, anicteric sclera Neck: no adenopathy, no carotid bruit, no JVD and supple, symmetrical, trachea midline  Lungs: CTAB, normal percussion bilaterally and Good air movement, nonlabored, no W./ R./R.  Heart: normal apical impulse, RRR, S1, S2 normal and 1-2/6 SEM at value S/P without radiation. No R./G.  Abdomen: soft, mild RUQ tenderness. No HSM Extremities: No C/C/E  Pulses: 2+ and symmetric  Neurologic: Grossly normal     Adult ECG Report  Rate: 66 ;  Rhythm: normal sinus rhythm; computer did not identify P waves that are clearly visible.  Narrative Interpretation: whole axis, intervals and durations. Normal EKG.   Other studies Reviewed: Additional studies/ records that were reviewed today include:  Recent Labs:   Lab Results  Component Value Date   CHOL 156 01/09/2015   HDL 44 01/09/2015   LDLCALC 99 01/09/2015   TRIG 64 01/09/2015   CHOLHDL 3.5 01/09/2015     Chemistry      Component Value Date/Time   NA 135 05/26/2015 0817   K 4.0 05/26/2015 0817   CL 106 05/26/2015 0817   CO2 23 05/26/2015 0817   BUN 22* 05/26/2015 0817   CREATININE 1.28* 05/26/2015 0817   CREATININE 1.31 01/09/2015 0927      Component Value Date/Time   CALCIUM 8.7* 05/26/2015 0817   ALKPHOS 30* 05/26/2015 0817   AST 41 05/26/2015 0817   ALT 52 05/26/2015 0817   BILITOT 0.7 05/26/2015 0817       ASSESSMENT / PLAN: Problem List Items Addressed This Visit    Preoperative cardiovascular examination    The patient does have a history of coronary artery disease that has been revascularized with no active symptoms of angina.  He does have chronic diastolic dysfunction with mild systolic dysfunction, no active heart failure. He has diabetes, and is on insulin. No  history of cerebrovascular disease.  Endovascularly, this procedure is relatively low risk from a cardiac standpoint -- however, if done open, a AAA repair is a very high-risk procedure her cardiac standpoint.  PREOPERATIVE CARDIAC RISK ASSESSMENT   Revised Cardiac Risk Index:  High Risk Surgery: no; unless converted to OPEN  Defined as Intraperitoneal, intrathoracic or suprainguinal vascular  Active CAD: no; no active angina (following PCI > 1 yr ago)  CHF: no; No CHF Sx despite mild EF reduction & diastolic dysfunction (no diuretic required)  Cerebrovascular Disease: no;   Diabetes: yes; On Insulin: no  CKD (Cr >~ 2): no  Total: 0 Estimated Risk of Adverse Outcome: <1% (however with borderline other RFs - would consider potentially at least one point and therefore 1-3 % risk Estimated Risk of MI, PE, VF/VT (Cardiac Arrest), Complete Heart Block: <3 %   ACC/AHA Guidelines for "Clearance":  Step 1 - Need for Emergency Surgery: No: not emergent  If Yes - go straight to OR with perioperative surveillance  Step 2 - Active Cardiac Conditions (Unstable Angina, Decompensated HF, Significant  Arrhytmias -  Complete HB, Mobitz II, Symptomatic VT or SVT, Severe Aortic Stenosis - mean gradient > 40 mmHg, Valve area < 1.0 cm2):   No: no  If Yes - Evaluate & Treat per ACC/AHA Guidelines  Step 3 -  Low Risk Surgery: No: not if Open  If Yes --> proceed to OR  If No --> Step 4  Step 4 - Functional Capacity >= 4 METS without symptoms: Yes  If Yes --> proceed to OR  If No --> Step 5  Step 5 --  Clinical Risk Factors (CRF)   3 or more: Yes  If Yes -- assess Surgical Risk, --  (High Risk Non-cardiac), Intraabdominal or thoracic vascular surgery consider testing if it will change management.  Intermediate Risk: Proceed to OR with HR control, or consider testing if it will change management  In the absence of active CAD/anginal symptoms or heart failure symptoms, I would not  otherwise evaluate for cardiac standpoint with any type of stress test. He did have a mildly reduced ejection fraction by left ventriculography last year and not followed up with an echocardiogram.. I would like to reassess an echocardiogram as this has not been done. This would be mostly from instrumentation and not for clearance.       Hypertension associated with diabetes ([place]) (Chronic)    Blood pressure is not well-controlled today as would be expected. On this is unusual because he has had well-controlled blood pressures and on previous evaluations. Would consider increasing ARB dose if pressures remain elevated, especially in light of upcoming procedure. For now we'll just have to continue current dose or consider increasing if needed.      Relevant Orders   EKG 12-Lead   ECHOCARDIOGRAM COMPLETE   Hyperlipidemia LDL goal <70; statin intolerant (Chronic)    Not currently on statin. He is on Zetia. Once we get him through his AAA procedure, we can referred for evaluation for starting Repatha.      History of ST elevation myocardial infarction (STEMI) of inferolateral wall (PTCA - 100% large lateral OM) (Chronic)    He did have evidence of infarct or Myoview in 2013. Would expect to have inferolateral wall motion abnormality on echocardiogram.  Would expect it to be somewhere between 40-50% (likely 45%).  Plan: Check 2-D echocardiogram to ensure no new wall motion abnormalities or abnormalities noted. This will also get baseline pressures.      Coronary stent restenosis with uncertain cause: Required PCI for ISR of OM1 (Promus P 2.75 mm x 12 mm) & pRCA    By now we're more than one year now. Okay to be off of the Alimta for planned operation if necessary.      Relevant Orders   EKG 12-Lead   ECHOCARDIOGRAM COMPLETE   Chronic diastolic CHF (congestive heart failure) ([facility]) (Chronic)    No active heart failure symptoms. He is still not on diuretic. He is on ARB for afterload  reduction. Plan is to recheck echocardiogram to reassess systolic as well as diastolic function and filling pressures.      CAD S/P CABG '95- several PCis since, last 05/17/14 - Primary (Chronic)    Most recently had stents in August 1 15. Patent one month later. Remains on Plavix and aspirin for antiplatelet agents. These are now safe to be held preop if necessary for open AAA repair. O  He is on ARB and calcium channel blocker which is for blood pressure and that the reduction. . Beta blocker not  Used  due to history of bradycardia.  He is statin intolerant, we'll try to keep up with his lipids. I don't have lipid evaluation recently.      Relevant Orders   EKG 12-Lead   ECHOCARDIOGRAM COMPLETE    Other Visit Diagnoses    Pre-operative cardiovascular examination        Relevant Orders    EKG 12-Lead    ECHOCARDIOGRAM COMPLETE    DOE (dyspnea on exertion)        Relevant Orders    EKG 12-Lead    ECHOCARDIOGRAM COMPLETE       Current medicines are reviewed at length with the patient today. (+/- concerns) none The following changes have been made: none Studies Ordered:   Orders Placed This Encounter  Procedures  . EKG 12-Lead  . ECHOCARDIOGRAM COMPLETE   followup 6 months   HARDING, Leonie Green, M.D., M.S. Interventional Cardiologist   Pager # [phone]

## 2015-07-23 NOTE — Assessment & Plan Note (Addendum)
Most recently had stents in August 1 15. Patent one month later. Remains on Plavix and aspirin for antiplatelet agents. These are now safe to be held preop if necessary for open AAA repair. O  He is on ARB and calcium channel blocker which is for blood pressure and that the reduction. . Beta blocker not  Used due to history of bradycardia.  He is statin intolerant, we'll try to keep up with his lipids. I don't have lipid evaluation recently.

## 2015-07-23 NOTE — Assessment & Plan Note (Signed)
Blood pressure is not well-controlled today as would be expected. On this is unusual because he has had well-controlled blood pressures and on previous evaluations. Would consider increasing ARB dose if pressures remain elevated, especially in light of upcoming procedure. For now we'll just have to continue current dose or consider increasing if needed.

## 2015-07-23 NOTE — Patient Instructions (Signed)
Your physician has requested that you have an echocardiogram [address]. Echocardiography is a painless test that uses sound waves to create images of your heart. It provides your doctor with information about the size and shape of your heart and how well your heart's chambers and valves are working. This procedure takes approximately one hour. There are no restrictions for this procedures WILL CONTACT YOU WITH RESULTS.  If you need a refill on your cardiac medications before your next appointment, please call your pharmacy.   Your physician wants you to follow-up in 6 months with Dr Ellyn Hack.  You will receive a reminder letter in the mail two months in advance. If you don't receive a letter, please call our office to schedule the follow-up appointment.

## 2015-07-23 NOTE — Assessment & Plan Note (Signed)
By now we're more than one year now. Okay to be off of the Alimta for planned operation if necessary.

## 2015-07-29 ENCOUNTER — Other Ambulatory Visit: Payer: Self-pay

## 2015-07-29 ENCOUNTER — Encounter: Payer: Self-pay | Admitting: Vascular Surgery

## 2015-07-29 ENCOUNTER — Ambulatory Visit (HOSPITAL_COMMUNITY): Payer: Medicare Other | Attending: Internal Medicine

## 2015-07-29 DIAGNOSIS — I152 Hypertension secondary to endocrine disorders: Secondary | ICD-10-CM

## 2015-07-29 DIAGNOSIS — I358 Other nonrheumatic aortic valve disorders: Secondary | ICD-10-CM | POA: Insufficient documentation

## 2015-07-29 DIAGNOSIS — Z01818 Encounter for other preprocedural examination: Secondary | ICD-10-CM | POA: Insufficient documentation

## 2015-07-29 DIAGNOSIS — I059 Rheumatic mitral valve disease, unspecified: Secondary | ICD-10-CM | POA: Diagnosis not present

## 2015-07-29 DIAGNOSIS — I2510 Atherosclerotic heart disease of native coronary artery without angina pectoris: Secondary | ICD-10-CM | POA: Diagnosis not present

## 2015-07-29 DIAGNOSIS — E119 Type 2 diabetes mellitus without complications: Secondary | ICD-10-CM | POA: Diagnosis not present

## 2015-07-29 DIAGNOSIS — I340 Nonrheumatic mitral (valve) insufficiency: Secondary | ICD-10-CM | POA: Insufficient documentation

## 2015-07-29 DIAGNOSIS — Z9861 Coronary angioplasty status: Secondary | ICD-10-CM

## 2015-07-29 DIAGNOSIS — Z01810 Encounter for preprocedural cardiovascular examination: Secondary | ICD-10-CM

## 2015-07-29 DIAGNOSIS — T82855D Stenosis of coronary artery stent, subsequent encounter: Secondary | ICD-10-CM

## 2015-07-29 DIAGNOSIS — I517 Cardiomegaly: Secondary | ICD-10-CM | POA: Diagnosis not present

## 2015-07-29 DIAGNOSIS — I350 Nonrheumatic aortic (valve) stenosis: Secondary | ICD-10-CM | POA: Diagnosis not present

## 2015-07-29 DIAGNOSIS — T82857D Stenosis of cardiac prosthetic devices, implants and grafts, subsequent encounter: Secondary | ICD-10-CM | POA: Diagnosis not present

## 2015-07-29 DIAGNOSIS — I10 Essential (primary) hypertension: Secondary | ICD-10-CM | POA: Insufficient documentation

## 2015-07-29 DIAGNOSIS — R0609 Other forms of dyspnea: Secondary | ICD-10-CM

## 2015-07-29 DIAGNOSIS — I071 Rheumatic tricuspid insufficiency: Secondary | ICD-10-CM | POA: Diagnosis not present

## 2015-07-29 DIAGNOSIS — E1159 Type 2 diabetes mellitus with other circulatory complications: Secondary | ICD-10-CM | POA: Diagnosis not present

## 2015-07-30 ENCOUNTER — Telehealth: Payer: Self-pay | Admitting: *Deleted

## 2015-07-30 NOTE — Telephone Encounter (Signed)
-----   Message from Leonie Man, MD sent at 07/29/2015  5:32 PM EDT ----- Normal pump function and wall motion.  Not unexpectedly, abnormal relaxation (normal with age). NEW FINDING: the aortic valve that was previously just calcified (sclerotic) without any flow limitation, is not Mild to Moderately "Stenotic" - i.e. Narrowed with some flow limitation.  At this level of "Stenosis" - will will follow-up in ~ 1 year. Should not have any impact on AAA procedure.  Leonie Man, MD

## 2015-07-30 NOTE — Telephone Encounter (Signed)
Left message to call back  

## 2015-07-31 NOTE — Telephone Encounter (Signed)
[name] is returning your call, please call .   Thanks

## 2015-07-31 NOTE — Telephone Encounter (Signed)
Wife called again about results.

## 2015-07-31 NOTE — Telephone Encounter (Signed)
Spoke to wife results given, Verbalized understanding.

## 2015-08-01 ENCOUNTER — Other Ambulatory Visit: Payer: Self-pay | Admitting: *Deleted

## 2015-08-01 NOTE — Patient Outreach (Signed)
[facility]) Care Management  08/01/2015  KERRION KEMPPAINEN 03-Dec-1935 [id number]   Referral from [employer] [facility] [employer], assigned Johny Shock, RN to outreach for [facility] Management services.  Thanks, Ronnell Freshwater. [place], [place] Assistant Phone: [phone] Fax: [phone]

## 2015-08-01 NOTE — Patient Outreach (Signed)
[facility]) Care Management  08/01/2015  [name] November 15, 1935 [id number]   [place] attempted  1st outreach call to Tier 2 patient to discuss services of [place]. Patient was unavailable. HIPPA compliance voicemail message was left with return callback number.   [place] Care Management [phone]

## 2015-08-05 ENCOUNTER — Telehealth: Payer: Self-pay | Admitting: Cardiology

## 2015-08-05 ENCOUNTER — Encounter (HOSPITAL_COMMUNITY)
Admission: RE | Admit: 2015-08-05 | Discharge: 2015-08-05 | Disposition: A | Discharge status: Home / Self Care | Payer: Medicare Other | Source: Ambulatory Visit | Attending: Vascular Surgery | Admitting: Vascular Surgery

## 2015-08-05 ENCOUNTER — Encounter (HOSPITAL_COMMUNITY): Payer: Self-pay

## 2015-08-05 DIAGNOSIS — Z0183 Encounter for blood typing: Secondary | ICD-10-CM | POA: Diagnosis not present

## 2015-08-05 DIAGNOSIS — K219 Gastro-esophageal reflux disease without esophagitis: Secondary | ICD-10-CM | POA: Diagnosis not present

## 2015-08-05 DIAGNOSIS — Z951 Presence of aortocoronary bypass graft: Secondary | ICD-10-CM | POA: Insufficient documentation

## 2015-08-05 DIAGNOSIS — I714 Abdominal aortic aneurysm, without rupture: Secondary | ICD-10-CM | POA: Insufficient documentation

## 2015-08-05 DIAGNOSIS — Z79899 Other long term (current) drug therapy: Secondary | ICD-10-CM | POA: Diagnosis not present

## 2015-08-05 DIAGNOSIS — G4733 Obstructive sleep apnea (adult) (pediatric): Secondary | ICD-10-CM | POA: Diagnosis not present

## 2015-08-05 DIAGNOSIS — I2510 Atherosclerotic heart disease of native coronary artery without angina pectoris: Secondary | ICD-10-CM | POA: Diagnosis not present

## 2015-08-05 DIAGNOSIS — I10 Essential (primary) hypertension: Secondary | ICD-10-CM | POA: Diagnosis not present

## 2015-08-05 DIAGNOSIS — E119 Type 2 diabetes mellitus without complications: Secondary | ICD-10-CM | POA: Insufficient documentation

## 2015-08-05 DIAGNOSIS — Z7984 Long term (current) use of oral hypoglycemic drugs: Secondary | ICD-10-CM | POA: Diagnosis not present

## 2015-08-05 DIAGNOSIS — E785 Hyperlipidemia, unspecified: Secondary | ICD-10-CM | POA: Insufficient documentation

## 2015-08-05 DIAGNOSIS — Z01818 Encounter for other preprocedural examination: Secondary | ICD-10-CM | POA: Insufficient documentation

## 2015-08-05 DIAGNOSIS — Z01812 Encounter for preprocedural laboratory examination: Secondary | ICD-10-CM | POA: Diagnosis not present

## 2015-08-05 DIAGNOSIS — Z87891 Personal history of nicotine dependence: Secondary | ICD-10-CM | POA: Diagnosis not present

## 2015-08-05 DIAGNOSIS — Z7902 Long term (current) use of antithrombotics/antiplatelets: Secondary | ICD-10-CM | POA: Diagnosis not present

## 2015-08-05 DIAGNOSIS — Z7982 Long term (current) use of aspirin: Secondary | ICD-10-CM | POA: Diagnosis not present

## 2015-08-05 LAB — COMPREHENSIVE METABOLIC PANEL
ALK PHOS: 37 U/L — AB (ref 38–126)
ALT: 23 U/L (ref 17–63)
AST: 29 U/L (ref 15–41)
Albumin: 3.4 g/dL — ABNORMAL LOW (ref 3.5–5.0)
Anion gap: 6 (ref 5–15)
BILIRUBIN TOTAL: 0.7 mg/dL (ref 0.3–1.2)
BUN: 17 mg/dL (ref 6–20)
CHLORIDE: 110 mmol/L (ref 101–111)
CO2: 23 mmol/L (ref 22–32)
CREATININE: 1.33 mg/dL — AB (ref 0.61–1.24)
Calcium: 9.3 mg/dL (ref 8.9–10.3)
GFR calc Af Amer: 58 mL/min — ABNORMAL LOW (ref >60)
GFR, EST NON AFRICAN AMERICAN: 50 mL/min — AB (ref >60)
Glucose, Bld: 97 mg/dL (ref 65–99)
Potassium: 4.2 mmol/L (ref 3.5–5.1)
Sodium: 139 mmol/L (ref 135–145)
Total Protein: 6.2 g/dL — ABNORMAL LOW (ref 6.5–8.1)

## 2015-08-05 LAB — URINALYSIS, ROUTINE W REFLEX MICROSCOPIC
BILIRUBIN URINE: NEGATIVE
Glucose, UA: NEGATIVE mg/dL
Hgb urine dipstick: NEGATIVE
KETONES UR: NEGATIVE mg/dL
Leukocytes, UA: NEGATIVE
NITRITE: NEGATIVE
Protein, ur: NEGATIVE mg/dL
SPECIFIC GRAVITY, URINE: 1.016 (ref 1.005–1.030)
UROBILINOGEN UA: 1 mg/dL (ref 0.0–1.0)
pH: 7 (ref 5.0–8.0)

## 2015-08-05 LAB — SURGICAL PCR SCREEN
MRSA, PCR: NEGATIVE
STAPHYLOCOCCUS AUREUS: POSITIVE — AB

## 2015-08-05 LAB — GLUCOSE, CAPILLARY: Glucose-Capillary: 73 mg/dL (ref 65–99)

## 2015-08-05 LAB — CBC
HEMATOCRIT: 29.1 % — AB (ref 39.0–52.0)
HEMOGLOBIN: 9.5 g/dL — AB (ref 13.0–17.0)
MCH: 31.3 pg (ref 26.0–34.0)
MCHC: 32.6 g/dL (ref 30.0–36.0)
MCV: 95.7 fL (ref 78.0–100.0)
PLATELETS: 193 10*3/uL (ref 150–400)
RBC: 3.04 MIL/uL — AB (ref 4.22–5.81)
RDW: 17.2 % — ABNORMAL HIGH (ref 11.5–15.5)
WBC: 4.3 10*3/uL (ref 4.0–10.5)

## 2015-08-05 LAB — PROTIME-INR
INR: 1.11 (ref 0.00–1.49)
PROTHROMBIN TIME: 14.5 s (ref 11.6–15.2)

## 2015-08-05 LAB — APTT: aPTT: 28 seconds (ref 24–37)

## 2015-08-05 LAB — PREPARE RBC (CROSSMATCH): Order Confirmation: POSITIVE

## 2015-08-05 MED ORDER — SODIUM CHLORIDE 0.9 % IV SOLN
INTRAVENOUS | Status: DC
Start: 2015-08-05 — End: 2015-08-06

## 2015-08-05 NOTE — Progress Notes (Signed)
PATIENT'S SPOUSE STATED HE WAS INSTRUCTED TO CONTINUE ASPIRIN, BUT STOP PLAVIX. HIS LAST DOSE WILL BE 08/07/15.

## 2015-08-05 NOTE — Telephone Encounter (Signed)
Pt states that he went for his pre-surgery work up today. Pt states the nurse there told him he  should call to let Dr Ellyn Hack know he had swelling in his feet and ankles.  Pt states the swelling in his feet and ankles is the same as at the time of office visit with Dr Ellyn Hack 07/23/15, not any worse. Pt states his shortness of breath is not any worse than at time of office visit with Dr Ellyn Hack 07/23/15. Pt states he weighs regularly and his weight is stable. Pt advised I will forward to Dr Ellyn Hack for review.

## 2015-08-05 NOTE — Pre-Procedure Instructions (Signed)
[name]  08/05/2015      [facility] [place] ([place]), [place] [address] [place] ([place]) [place] [address] Phone: [phone] Fax: [place], [place] [address] Phone: [phone] Fax: [phone]    Your procedure is scheduled on   Wednesday  08/13/15  Report to [facility] Admitting at 630 A.M.  Call this number if you have problems the morning of surgery:  [phone]   Remember:  Do not eat food or drink liquids after midnight.  Take these medicines the morning of surgery with A SIP OF WATER     TYLENOL, AMLODIPINE, PANTOPRAZOLE (STOP PLAVIX AS DIRECTED AND MULTIVITAMIN) How to Manage Your Diabetes Before Surgery   Why is it important to control my blood sugar before and after surgery?   Improving blood sugar levels before and after surgery helps healing and can limit problems.  A way of improving blood sugar control is eating a healthy diet by:  - Eating less sugar and carbohydrates  - Increasing activity/exercise  - Talk with your doctor about reaching your blood sugar goals  High blood sugars (greater than 180 mg/dL) can raise your risk of infections and slow down your recovery so you will need to focus on controlling your diabetes during the weeks before surgery.  Make sure that the doctor who takes care of your diabetes knows about your planned surgery including the date and location.  How do I manage my blood sugars before surgery?   Check your blood sugar at least 4 times a day, 2 days before surgery to make sure that they are not too high or low.   Check your blood sugar the morning of your surgery when you wake up and every 2               hours until you get to the Short-Stay unit.  If your blood sugar is less than 70 mg/dL, you will need to treat for low blood sugar by:  Treat a low blood sugar (less than 70  mg/dL) with 1/2 cup of clear juice (cranberry or apple), 4 glucose tablets, OR glucose gel.  Recheck blood sugar in 15 minutes after treatment (to make sure it is greater than 70 mg/dL).  If blood sugar is not greater than 70 mg/dL on re-check, call [phone] for further instructions.   Report your blood sugar to the Short-Stay nurse when you get to Short-Stay.  References:  [facility], 2007 "How to Manage your Diabetes Before and After Surgery".  What do I do about my diabetes medications?   Do not take oral diabetes medicines (pills) the morning of surgery.    For patients with "Insulin Pumps":  Contact your diabetes doctor for specific instructions before surgery.   Decrease basal insulin rates by 20% at midnight the night before surgery.  Note that if your surgery is planned to be longer than 2 hours, your insulin pump will be removed and intravenous (IV) insulin will be started and managed by the nurses and anesthesiologist.  You will be able to restart your insulin pump once you are awake and able to manage it.  Make sure to bring insulin pump supplies to the hospital with you in case your site needs to be changed.        Do not wear jewelry, make-up or  nail polish.  Do not wear lotions, powders, or perfumes.  You may wear deodorant.  Do not shave 48 hours prior to surgery.  Men may shave face and neck.  Do not bring valuables to the hospital.  [facility] is not responsible for any belongings or valuables.  Contacts, dentures or bridgework may not be worn into surgery.  Leave your suitcase in the car.  After surgery it may be brought to your room.  For patients admitted to the hospital, discharge time will be determined by your treatment team.  Patients discharged the day of surgery will not be allowed to drive home.   Name and phone number of your driver:   Special instructions:  [facility] - Preparing for Surgery  Before surgery,  you can play an important role.  Because skin is not sterile, your skin needs to be as free of germs as possible.  You can reduce the number of germs on you skin by washing with CHG (chlorahexidine gluconate) soap before surgery.  CHG is an antiseptic cleaner which kills germs and bonds with the skin to continue killing germs even after washing.  Please DO NOT use if you have an allergy to CHG or antibacterial soaps.  If your skin becomes reddened/irritated stop using the CHG and inform your nurse when you arrive at Short Stay.  Do not shave (including legs and underarms) for at least 48 hours prior to the first CHG shower.  You may shave your face.  Please follow these instructions carefully:   1.  Shower with CHG Soap the night before surgery and the                                morning of Surgery.  2.  If you choose to wash your hair, wash your hair first as usual with your       normal shampoo.  3.  After you shampoo, rinse your hair and body thoroughly to remove the                      Shampoo.  4.  Use CHG as you would any other liquid soap.  You can apply chg directly       to the skin and wash gently with scrungie or a clean washcloth.  5.  Apply the CHG Soap to your body ONLY FROM THE NECK DOWN.        Do not use on open wounds or open sores.  Avoid contact with your eyes,       ears, mouth and genitals (private parts).  Wash genitals (private parts)       with your normal soap.  6.  Wash thoroughly, paying special attention to the area where your surgery        will be performed.  7.  Thoroughly rinse your body with warm water from the neck down.  8.  DO NOT shower/wash with your normal soap after using and rinsing off       the CHG Soap.  9.  Pat yourself dry with a clean towel.            10.  Wear clean pajamas.            11.  Place clean sheets on your bed the night of your first shower and do not        sleep with pets.  Day of Surgery  Do not apply any lotions/deoderants the  morning of surgery.  Please wear clean clothes to the hospital/surgery center.    Please read over the following fact sheets that you were given. Pain Booklet, Coughing and Deep Breathing, Blood Transfusion Information, MRSA Information and Surgical Site Infection Prevention

## 2015-08-05 NOTE — Progress Notes (Signed)
Notified pt of positive PCR of staph. Pt thinks he still has Mupirocin Ointment from 2 months ago. If he doesn't he will call me back and I will call in RX for him. Instructed him to use it twice a day for 5 days and to use a Q-tip to put it in his nose.

## 2015-08-05 NOTE — Progress Notes (Signed)
Pt called back stating that he couldn't find the ointment. I called in Mupirocin Ointment Rx to [facility] on [address].

## 2015-08-05 NOTE — Telephone Encounter (Signed)
Pt's wife  Called in stating that she noticed that the pt had some swelling in his ankles and legs. She is concerned because he is scheduled to have some stents placed on 11/9 and she wanted to know if there is something he needed to do prior to the procedure. Please f/u\  Thanks

## 2015-08-06 ENCOUNTER — Other Ambulatory Visit: Payer: Self-pay | Admitting: Family Medicine

## 2015-08-06 LAB — HEMOGLOBIN A1C
HEMOGLOBIN A1C: 5.4 % (ref 4.8–5.6)
MEAN PLASMA GLUCOSE: 108 mg/dL

## 2015-08-06 NOTE — Progress Notes (Signed)
Anesthesia Chart Review:  Pt is 79 year old male scheduled for abdominal aortic endovascular stent graft on 08/13/2015 with Dr. Kellie Simmering.   Cardiologist is Dr. Glenetta Hew. PCP is Dr. Jill Alexanders.   PMH includes: CAD (S/p CABG (LIMA-LAD, SVG-OM), s/p multiple stents/PCI), HTN, DM, hyperlipidemia, anemia, AAA, OSA (no CPAP), GERD. Former smoker. BMI 31. Alcohol abuse: pt reported 3 or more beers per day in August; PCP notes indicate 4-5 beers per day.   S/p ERCP 06/30/15, 07/05/14, 07/03/14. S/p laparoscopic cholecystectomy 05/23/15. S/p posterior cervical fusion/ foraminotomy 05/02/13. S/p anterior lumbar fusion 11/22/12.   Per Dr. Allison Quarry notes: Pt "has a long-standing history of CAD dating back to 64. Most recent cardiac evaluation and treatment was on for her staged PCI back in August of 2015 weighted PCI to in-stent restenosis in the RCA as well as OM1 in both areas treated with DES stents. He then had a cardiac catheterization shortly thereafter showing widely patent stents and grafts".   Medications include: amlodipine, ASA, fenofibrate, plavix, zetia, iron, losartan, protonix, pioglitazone-metformin, sildenafil. Last dose of plavix 08/07/15.   Preoperative labs reviewed.  H/H 9.5/29.1. Hgb results over last year 8.8-11.3. T&S obtained at PAT. Left voicemail for [facility] in Dr. Evelena Leyden office with hgb results.   EKG 07/20/15: NSR.   Echo 07/29/15:  - Left ventricle: The cavity size was normal. Wall thickness was normal. Systolic function was normal. The estimated ejection fraction was in the range of 55% to 60%. Wall motion was normal; there were no regional wall motion abnormalities. Doppler parameters are consistent with abnormal left ventricular relaxation (grade 1 diastolic dysfunction). The E/e&' ratio is >15, suggesting elevated LV filling pressure. - Aortic valve: Trileaflet; mildly calcified leaflets. Mean gradient (S): 10 mm Hg. Peak gradient (S): 18 mm Hg. Valve area (VTI): 1.42  cm^2. Valve area (Vmax): 1.51 cm^2. Valve area (Vmean): 1.47 cm^2. - Mitral valve: Calcified annulus. There was mild regurgitation. - Left atrium: Moderately dilated at 45 ml/m2. - Right atrium: The atrium was mildly dilated. - Tricuspid valve: There was mild regurgitation. - Pulmonary arteries: Dilated. PA peak pressure: 30 mm Hg (S). - Inferior vena cava: The vessel was normal in size. The respirophasic diameter changes were in the normal range (= 50%), consistent with normal central venous pressure. - Impressions: Compared to a prior echo in 2013, there is now mild to moderateaortic stenosis - AVA around 1.5 cm2.  Nuclear stress test 09/22/12: Low risk stress nuclear study demonstrating a moderate region on mid to basal inferior to inferolateral scar without significant ischemia. LV Wall Motion: Mild apical hypokinesis with EF 47%  Cardiac cath 01/01/2009: 1. Loss of the saphenous vein graft to the OM system, which is an old finding. 2. Widely patent stents in the native OM and in the right coronary artery. 3. No high-grade stenosis in any major vessels, but the ongoing circumflex has ostial 70-80% narrowing. This comes off within the stent, is followed by saccular aneurysmal segment. Any type of intervention on this relatively small and somewhat insignificant vessel would clearly be high risk. In view of his nuclear study being negative for ischemia, I have opted to treat him medically.  Pt has cardiac clearance from Dr. Ellyn Hack in [place] note dated 07/23/15.   If no changes, I anticipate pt can proceed with surgery as scheduled.   Willeen Cass, FNP-BC [facility] Short Stay Surgical Center/Anesthesiology Phone: [phone] 08/06/2015 4:03 PM

## 2015-08-06 NOTE — Telephone Encounter (Signed)
Left message to call back  

## 2015-08-06 NOTE — Telephone Encounter (Signed)
His lower leg swelling is about the same as it has been. He is not having a significant heart failure symptoms. I would not treat this with anything more than just feet elevation.  If he has some signs of volume overload postoperatively given some Lasix., But I don't think he needs anything long-term.  Leonie Man, MD

## 2015-08-07 ENCOUNTER — Other Ambulatory Visit: Payer: Self-pay | Admitting: *Deleted

## 2015-08-07 NOTE — Telephone Encounter (Signed)
Does [name] still need to speak with [name]?  Please call [phone] not answer--call [phone]

## 2015-08-07 NOTE — Telephone Encounter (Signed)
Spoke to wife ,she is aware of Dr Ellyn Hack comments

## 2015-08-07 NOTE — Patient Outreach (Signed)
[facility]) Care Management  08/07/2015  [name] 09/06/1936 [id number]   Telephone call back  from patient wife [phone] while in meeting. Telephone call to patient 10:00 08/07/2015 no answer. Answering machine did not pick up to leave a message. RN Health Coach will call again later  today.  [place] Care Management [phone]

## 2015-08-08 ENCOUNTER — Encounter: Payer: Self-pay | Admitting: *Deleted

## 2015-08-08 ENCOUNTER — Other Ambulatory Visit: Payer: Self-pay | Admitting: *Deleted

## 2015-08-08 NOTE — Patient Outreach (Addendum)
[facility]) Care Management  08/08/2015  [name] 03-11-1936 [id number]   RN Health Coach telephone call to patient. [employer] Compliance verified.  RN Health Coach discussed with the wife the [facility] program. Per patient and wife. Patient  Is having surgery on 08/13/2015 for a triple A graft. Per wife she will call me after the surgery and discuss the program further.   Plan RN Will send out a packet so patient and wife can look it over.  [place] Care Management [phone]

## 2015-08-13 ENCOUNTER — Encounter (HOSPITAL_COMMUNITY)
Admission: RE | Disposition: A | Discharge status: Home / Self Care | Payer: Self-pay | Source: Ambulatory Visit | Attending: Vascular Surgery

## 2015-08-13 ENCOUNTER — Encounter (HOSPITAL_COMMUNITY): Payer: Self-pay | Admitting: Certified Registered Nurse Anesthetist

## 2015-08-13 ENCOUNTER — Inpatient Hospital Stay (HOSPITAL_COMMUNITY)
Admission: RE | Admit: 2015-08-13 | Discharge: 2015-08-14 | DRG: 269 | Disposition: A | Discharge status: Home / Self Care | Payer: Medicare Other | Source: Ambulatory Visit | Attending: Vascular Surgery | Admitting: Vascular Surgery

## 2015-08-13 ENCOUNTER — Inpatient Hospital Stay (HOSPITAL_COMMUNITY): Payer: Medicare Other | Admitting: Emergency Medicine

## 2015-08-13 ENCOUNTER — Inpatient Hospital Stay (HOSPITAL_COMMUNITY): Payer: Medicare Other | Admitting: Anesthesiology

## 2015-08-13 ENCOUNTER — Inpatient Hospital Stay (HOSPITAL_COMMUNITY): Payer: Medicare Other

## 2015-08-13 ENCOUNTER — Other Ambulatory Visit: Payer: Self-pay | Admitting: *Deleted

## 2015-08-13 DIAGNOSIS — I714 Abdominal aortic aneurysm, without rupture, unspecified: Secondary | ICD-10-CM

## 2015-08-13 DIAGNOSIS — Z87891 Personal history of nicotine dependence: Secondary | ICD-10-CM | POA: Diagnosis not present

## 2015-08-13 DIAGNOSIS — Z9889 Other specified postprocedural states: Secondary | ICD-10-CM

## 2015-08-13 DIAGNOSIS — Z7982 Long term (current) use of aspirin: Secondary | ICD-10-CM | POA: Diagnosis not present

## 2015-08-13 DIAGNOSIS — Z48812 Encounter for surgical aftercare following surgery on the circulatory system: Secondary | ICD-10-CM

## 2015-08-13 DIAGNOSIS — Z79899 Other long term (current) drug therapy: Secondary | ICD-10-CM

## 2015-08-13 DIAGNOSIS — Z7902 Long term (current) use of antithrombotics/antiplatelets: Secondary | ICD-10-CM | POA: Diagnosis not present

## 2015-08-13 DIAGNOSIS — I10 Essential (primary) hypertension: Secondary | ICD-10-CM | POA: Diagnosis present

## 2015-08-13 DIAGNOSIS — N529 Male erectile dysfunction, unspecified: Secondary | ICD-10-CM | POA: Diagnosis present

## 2015-08-13 DIAGNOSIS — K219 Gastro-esophageal reflux disease without esophagitis: Secondary | ICD-10-CM | POA: Diagnosis not present

## 2015-08-13 DIAGNOSIS — I2510 Atherosclerotic heart disease of native coronary artery without angina pectoris: Secondary | ICD-10-CM | POA: Diagnosis present

## 2015-08-13 DIAGNOSIS — I252 Old myocardial infarction: Secondary | ICD-10-CM

## 2015-08-13 DIAGNOSIS — E119 Type 2 diabetes mellitus without complications: Secondary | ICD-10-CM | POA: Diagnosis present

## 2015-08-13 DIAGNOSIS — Z96651 Presence of right artificial knee joint: Secondary | ICD-10-CM | POA: Diagnosis present

## 2015-08-13 DIAGNOSIS — Z833 Family history of diabetes mellitus: Secondary | ICD-10-CM

## 2015-08-13 DIAGNOSIS — Z951 Presence of aortocoronary bypass graft: Secondary | ICD-10-CM | POA: Diagnosis not present

## 2015-08-13 DIAGNOSIS — Z888 Allergy status to other drugs, medicaments and biological substances status: Secondary | ICD-10-CM | POA: Diagnosis not present

## 2015-08-13 DIAGNOSIS — E785 Hyperlipidemia, unspecified: Secondary | ICD-10-CM | POA: Diagnosis present

## 2015-08-13 DIAGNOSIS — Z955 Presence of coronary angioplasty implant and graft: Secondary | ICD-10-CM | POA: Diagnosis not present

## 2015-08-13 DIAGNOSIS — Z452 Encounter for adjustment and management of vascular access device: Secondary | ICD-10-CM | POA: Diagnosis not present

## 2015-08-13 DIAGNOSIS — G8929 Other chronic pain: Secondary | ICD-10-CM | POA: Diagnosis present

## 2015-08-13 DIAGNOSIS — Z8249 Family history of ischemic heart disease and other diseases of the circulatory system: Secondary | ICD-10-CM

## 2015-08-13 DIAGNOSIS — Z419 Encounter for procedure for purposes other than remedying health state, unspecified: Secondary | ICD-10-CM

## 2015-08-13 DIAGNOSIS — M549 Dorsalgia, unspecified: Secondary | ICD-10-CM | POA: Diagnosis present

## 2015-08-13 HISTORY — PX: ABDOMINAL AORTIC ENDOVASCULAR STENT GRAFT: SHX5707

## 2015-08-13 LAB — BLOOD GAS, ARTERIAL
ACID-BASE DEFICIT: 2.4 mmol/L — AB (ref 0.0–2.0)
Bicarbonate: 21 mEq/L (ref 20.0–24.0)
Drawn by: 206361
FIO2: 0.21
O2 Saturation: 97.4 %
PATIENT TEMPERATURE: 98.6
PCO2 ART: 30.6 mmHg — AB (ref 35.0–45.0)
PH ART: 7.45 (ref 7.350–7.450)
PO2 ART: 103 mmHg — AB (ref 80.0–100.0)
TCO2: 21.9 mmol/L (ref 0–100)

## 2015-08-13 LAB — CBC
HEMATOCRIT: 26.6 % — AB (ref 39.0–52.0)
HEMOGLOBIN: 8.7 g/dL — AB (ref 13.0–17.0)
MCH: 31.1 pg (ref 26.0–34.0)
MCHC: 32.7 g/dL (ref 30.0–36.0)
MCV: 95 fL (ref 78.0–100.0)
PLATELETS: 160 10*3/uL (ref 150–400)
RBC: 2.8 MIL/uL — AB (ref 4.22–5.81)
RDW: 16.1 % — AB (ref 11.5–15.5)
WBC: 4.2 10*3/uL (ref 4.0–10.5)

## 2015-08-13 LAB — GLUCOSE, CAPILLARY
Glucose-Capillary: 92 mg/dL (ref 65–99)
Glucose-Capillary: 101 mg/dL — ABNORMAL HIGH (ref 65–99)

## 2015-08-13 LAB — BASIC METABOLIC PANEL
ANION GAP: 4 — AB (ref 5–15)
BUN: 13 mg/dL (ref 6–20)
CO2: 24 mmol/L (ref 22–32)
CREATININE: 1.28 mg/dL — AB (ref 0.61–1.24)
Calcium: 8.4 mg/dL — ABNORMAL LOW (ref 8.9–10.3)
Chloride: 110 mmol/L (ref 101–111)
GFR calc non Af Amer: 52 mL/min — ABNORMAL LOW (ref >60)
Glucose, Bld: 106 mg/dL — ABNORMAL HIGH (ref 65–99)
Potassium: 4.2 mmol/L (ref 3.5–5.1)
SODIUM: 138 mmol/L (ref 135–145)

## 2015-08-13 LAB — APTT: APTT: 29 s (ref 24–37)

## 2015-08-13 LAB — MAGNESIUM: Magnesium: 2 mg/dL (ref 1.7–2.4)

## 2015-08-13 LAB — PROTIME-INR
INR: 1.19 (ref 0.00–1.49)
PROTHROMBIN TIME: 15.2 s (ref 11.6–15.2)

## 2015-08-13 SURGERY — INSERTION, ENDOVASCULAR STENT GRAFT, AORTA, ABDOMINAL
Anesthesia: General | Site: Abdomen

## 2015-08-13 MED ORDER — LIDOCAINE HCL (CARDIAC) 20 MG/ML IV SOLN
INTRAVENOUS | Status: DC | PRN
  Administered 2015-08-13: 100 mg via INTRAVENOUS

## 2015-08-13 MED ORDER — PHENYLEPHRINE HCL 10 MG/ML IJ SOLN
10.0000 mg | INTRAVENOUS | Status: DC | PRN
  Administered 2015-08-13: 20 ug/min via INTRAVENOUS

## 2015-08-13 MED ORDER — ALUM & MAG HYDROXIDE-SIMETH 200-200-20 MG/5ML PO SUSP: 15.0000 mL | ORAL | Status: DC | PRN

## 2015-08-13 MED ORDER — LIDOCAINE HCL (CARDIAC) 20 MG/ML IV SOLN
INTRAVENOUS | Status: AC
  Filled 2015-08-13: qty 5

## 2015-08-13 MED ORDER — LACTATED RINGERS IV SOLN
INTRAVENOUS | Status: DC | PRN
  Administered 2015-08-13: 08:00:00 via INTRAVENOUS

## 2015-08-13 MED ORDER — PROTAMINE SULFATE 10 MG/ML IV SOLN
INTRAVENOUS | Status: AC
  Filled 2015-08-13: qty 5

## 2015-08-13 MED ORDER — FENOFIBRIC ACID 135 MG PO CPDR: 1.0000 | DELAYED_RELEASE_CAPSULE | Freq: Every day | ORAL | Status: DC

## 2015-08-13 MED ORDER — AMLODIPINE BESYLATE 5 MG PO TABS
5.0000 mg | ORAL_TABLET | Freq: Every day | ORAL | Status: DC
  Administered 2015-08-14: 5 mg via ORAL
  Filled 2015-08-13: qty 1

## 2015-08-13 MED ORDER — CLOPIDOGREL BISULFATE 75 MG PO TABS
75.0000 mg | ORAL_TABLET | Freq: Every day | ORAL | Status: DC
  Administered 2015-08-13 – 2015-08-14 (×2): 75 mg via ORAL
  Filled 2015-08-13 (×2): qty 1

## 2015-08-13 MED ORDER — ASPIRIN 81 MG PO CHEW
81.0000 mg | CHEWABLE_TABLET | Freq: Every day | ORAL | Status: DC
  Administered 2015-08-14: 81 mg via ORAL
  Filled 2015-08-13: qty 1

## 2015-08-13 MED ORDER — GLYCOPYRROLATE 0.2 MG/ML IJ SOLN
INTRAMUSCULAR | Status: AC
  Filled 2015-08-13: qty 4

## 2015-08-13 MED ORDER — PIOGLITAZONE HCL-METFORMIN HCL 15-500 MG PO TABS: 1.0000 | ORAL_TABLET | Freq: Every day | ORAL | Status: DC

## 2015-08-13 MED ORDER — HYDRALAZINE HCL 20 MG/ML IJ SOLN: 5.0000 mg | INTRAMUSCULAR | Status: DC | PRN

## 2015-08-13 MED ORDER — MORPHINE SULFATE (PF) 2 MG/ML IV SOLN: 2.0000 mg | INTRAVENOUS | Status: DC | PRN

## 2015-08-13 MED ORDER — SUGAMMADEX SODIUM 200 MG/2ML IV SOLN
INTRAVENOUS | Status: DC | PRN
  Administered 2015-08-13: 200 mg via INTRAVENOUS

## 2015-08-13 MED ORDER — CHLORHEXIDINE GLUCONATE CLOTH 2 % EX PADS: 6.0000 | MEDICATED_PAD | Freq: Once | CUTANEOUS | Status: DC

## 2015-08-13 MED ORDER — PROPOFOL 10 MG/ML IV BOLUS
INTRAVENOUS | Status: AC
  Filled 2015-08-13: qty 20

## 2015-08-13 MED ORDER — ONDANSETRON HCL 4 MG/2ML IJ SOLN: 4.0000 mg | Freq: Four times a day (QID) | INTRAMUSCULAR | Status: DC | PRN

## 2015-08-13 MED ORDER — SODIUM CHLORIDE 0.9 % IJ SOLN
INTRAMUSCULAR | Status: AC
Start: 2015-08-13 — End: 2015-08-13
  Filled 2015-08-13: qty 10

## 2015-08-13 MED ORDER — ONDANSETRON HCL 4 MG/2ML IJ SOLN: 4.0000 mg | Freq: Once | INTRAMUSCULAR | Status: DC | PRN

## 2015-08-13 MED ORDER — PROPOFOL 10 MG/ML IV BOLUS
INTRAVENOUS | Status: AC
Start: 2015-08-13 — End: 2015-08-13
  Filled 2015-08-13: qty 20

## 2015-08-13 MED ORDER — MIDAZOLAM HCL 5 MG/5ML IJ SOLN
INTRAMUSCULAR | Status: DC | PRN
  Administered 2015-08-13 (×2): 1 mg via INTRAVENOUS

## 2015-08-13 MED ORDER — SODIUM CHLORIDE 0.9 % IV SOLN
INTRAVENOUS | Status: DC
  Administered 2015-08-13: 21:00:00 via INTRAVENOUS
  Administered 2015-08-13: 125 mL/h via INTRAVENOUS

## 2015-08-13 MED ORDER — SODIUM CHLORIDE 0.9 % IV SOLN
INTRAVENOUS | Status: DC | PRN
  Administered 2015-08-13: 500 mL

## 2015-08-13 MED ORDER — ONDANSETRON HCL 4 MG/2ML IJ SOLN
INTRAMUSCULAR | Status: AC
  Filled 2015-08-13: qty 2

## 2015-08-13 MED ORDER — DOCUSATE SODIUM 100 MG PO CAPS
100.0000 mg | ORAL_CAPSULE | Freq: Every day | ORAL | Status: DC
  Administered 2015-08-14: 100 mg via ORAL
  Filled 2015-08-13: qty 1

## 2015-08-13 MED ORDER — ENOXAPARIN SODIUM 40 MG/0.4ML ~~LOC~~ SOLN
40.0000 mg | SUBCUTANEOUS | Status: DC
  Administered 2015-08-14: 40 mg via SUBCUTANEOUS
  Filled 2015-08-13 (×2): qty 0.4

## 2015-08-13 MED ORDER — OXYCODONE-ACETAMINOPHEN 5-325 MG PO TABS
1.0000 | ORAL_TABLET | ORAL | Status: DC | PRN
  Administered 2015-08-13 – 2015-08-14 (×3): 1 via ORAL
  Filled 2015-08-13 (×3): qty 1

## 2015-08-13 MED ORDER — ROCURONIUM BROMIDE 50 MG/5ML IV SOLN
INTRAVENOUS | Status: AC
Start: 2015-08-13 — End: 2015-08-13
  Filled 2015-08-13: qty 1

## 2015-08-13 MED ORDER — ETOMIDATE 2 MG/ML IV SOLN
INTRAVENOUS | Status: DC | PRN
  Administered 2015-08-13: 20 mg via INTRAVENOUS

## 2015-08-13 MED ORDER — FENTANYL CITRATE (PF) 250 MCG/5ML IJ SOLN
INTRAMUSCULAR | Status: AC
Start: 2015-08-13 — End: 2015-08-13
  Filled 2015-08-13: qty 5

## 2015-08-13 MED ORDER — SENNOSIDES-DOCUSATE SODIUM 8.6-50 MG PO TABS
1.0000 | ORAL_TABLET | Freq: Every evening | ORAL | Status: DC | PRN
Start: 2015-08-13 — End: 2015-08-14

## 2015-08-13 MED ORDER — LABETALOL HCL 5 MG/ML IV SOLN: 10.0000 mg | INTRAVENOUS | Status: DC | PRN

## 2015-08-13 MED ORDER — SUGAMMADEX SODIUM 200 MG/2ML IV SOLN
INTRAVENOUS | Status: AC
  Filled 2015-08-13: qty 2

## 2015-08-13 MED ORDER — FENOFIBRATE 160 MG PO TABS
160.0000 mg | ORAL_TABLET | Freq: Every day | ORAL | Status: DC
  Administered 2015-08-14: 160 mg via ORAL
  Filled 2015-08-13: qty 1

## 2015-08-13 MED ORDER — HEPARIN SODIUM (PORCINE) 1000 UNIT/ML IJ SOLN
INTRAMUSCULAR | Status: AC
  Filled 2015-08-13: qty 1

## 2015-08-13 MED ORDER — ACETAMINOPHEN 500 MG PO TABS: 1000.0000 mg | ORAL_TABLET | Freq: Four times a day (QID) | ORAL | Status: DC | PRN

## 2015-08-13 MED ORDER — GLYCOPYRROLATE 0.2 MG/ML IJ SOLN
INTRAMUSCULAR | Status: DC | PRN
  Administered 2015-08-13: .2 mg via INTRAVENOUS

## 2015-08-13 MED ORDER — MIDAZOLAM HCL 2 MG/2ML IJ SOLN
INTRAMUSCULAR | Status: AC
  Filled 2015-08-13: qty 4

## 2015-08-13 MED ORDER — EZETIMIBE 10 MG PO TABS
10.0000 mg | ORAL_TABLET | Freq: Every day | ORAL | Status: DC
  Administered 2015-08-14: 10 mg via ORAL
  Filled 2015-08-13: qty 1

## 2015-08-13 MED ORDER — PANTOPRAZOLE SODIUM 40 MG PO TBEC
40.0000 mg | DELAYED_RELEASE_TABLET | Freq: Every day | ORAL | Status: DC
  Administered 2015-08-14: 40 mg via ORAL

## 2015-08-13 MED ORDER — BISACODYL 5 MG PO TBEC: 5.0000 mg | DELAYED_RELEASE_TABLET | Freq: Every day | ORAL | Status: DC | PRN

## 2015-08-13 MED ORDER — GLYCOPYRROLATE 0.2 MG/ML IJ SOLN
INTRAMUSCULAR | Status: AC
  Filled 2015-08-13: qty 1

## 2015-08-13 MED ORDER — FERROUS SULFATE 325 (65 FE) MG PO TABS
325.0000 mg | ORAL_TABLET | Freq: Every day | ORAL | Status: DC
  Administered 2015-08-14: 325 mg via ORAL
  Filled 2015-08-13 (×2): qty 1

## 2015-08-13 MED ORDER — LOSARTAN POTASSIUM 50 MG PO TABS
50.0000 mg | ORAL_TABLET | Freq: Every day | ORAL | Status: DC
  Administered 2015-08-13 – 2015-08-14 (×2): 50 mg via ORAL
  Filled 2015-08-13 (×2): qty 1

## 2015-08-13 MED ORDER — SODIUM CHLORIDE 0.9 % IV SOLN: 500.0000 mL | Freq: Once | INTRAVENOUS | Status: DC | PRN

## 2015-08-13 MED ORDER — EPHEDRINE SULFATE 50 MG/ML IJ SOLN
INTRAMUSCULAR | Status: AC
  Filled 2015-08-13: qty 1

## 2015-08-13 MED ORDER — IODIXANOL 320 MG/ML IV SOLN
INTRAVENOUS | Status: DC | PRN
  Administered 2015-08-13: 50.2 mL via INTRAVENOUS

## 2015-08-13 MED ORDER — CEFUROXIME SODIUM 1.5 G IJ SOLR
INTRAMUSCULAR | Status: AC
  Filled 2015-08-13: qty 1.5

## 2015-08-13 MED ORDER — FENTANYL CITRATE (PF) 100 MCG/2ML IJ SOLN
INTRAMUSCULAR | Status: DC | PRN
  Administered 2015-08-13 (×2): 100 ug via INTRAVENOUS

## 2015-08-13 MED ORDER — CEFUROXIME SODIUM 1.5 G IJ SOLR
1.5000 g | INTRAMUSCULAR | Status: AC
  Administered 2015-08-13: 1.5 g via INTRAVENOUS

## 2015-08-13 MED ORDER — FENTANYL CITRATE (PF) 100 MCG/2ML IJ SOLN: 25.0000 ug | INTRAMUSCULAR | Status: DC | PRN

## 2015-08-13 MED ORDER — PHENYLEPHRINE 40 MCG/ML (10ML) SYRINGE FOR IV PUSH (FOR BLOOD PRESSURE SUPPORT)
PREFILLED_SYRINGE | INTRAVENOUS | Status: AC
  Filled 2015-08-13: qty 10

## 2015-08-13 MED ORDER — ROCURONIUM BROMIDE 50 MG/5ML IV SOLN
INTRAVENOUS | Status: AC
  Filled 2015-08-13: qty 1

## 2015-08-13 MED ORDER — MAGNESIUM SULFATE 2 GM/50ML IV SOLN: 2.0000 g | Freq: Every day | INTRAVENOUS | Status: DC | PRN

## 2015-08-13 MED ORDER — PROTAMINE SULFATE 10 MG/ML IV SOLN
INTRAVENOUS | Status: DC | PRN
  Administered 2015-08-13: 10 mg via INTRAVENOUS

## 2015-08-13 MED ORDER — OXYCODONE HCL 5 MG PO TABS: 5.0000 mg | ORAL_TABLET | Freq: Four times a day (QID) | ORAL | Status: DC | PRN

## 2015-08-13 MED ORDER — SUCCINYLCHOLINE CHLORIDE 20 MG/ML IJ SOLN
INTRAMUSCULAR | Status: AC
  Filled 2015-08-13: qty 1

## 2015-08-13 MED ORDER — HEPARIN SODIUM (PORCINE) 1000 UNIT/ML IJ SOLN
INTRAMUSCULAR | Status: DC | PRN
  Administered 2015-08-13: 6000 [IU] via INTRAVENOUS

## 2015-08-13 MED ORDER — ROCURONIUM BROMIDE 100 MG/10ML IV SOLN
INTRAVENOUS | Status: DC | PRN
  Administered 2015-08-13: 50 mg via INTRAVENOUS
  Administered 2015-08-13: 20 mg via INTRAVENOUS

## 2015-08-13 MED ORDER — POTASSIUM CHLORIDE CRYS ER 20 MEQ PO TBCR: 20.0000 meq | EXTENDED_RELEASE_TABLET | Freq: Every day | ORAL | Status: DC | PRN

## 2015-08-13 MED ORDER — PHENOL 1.4 % MT LIQD: 1.0000 | OROMUCOSAL | Status: DC | PRN

## 2015-08-13 MED ORDER — METOPROLOL TARTRATE 1 MG/ML IV SOLN: 2.0000 mg | INTRAVENOUS | Status: DC | PRN

## 2015-08-13 MED ORDER — ONDANSETRON HCL 4 MG/2ML IJ SOLN
INTRAMUSCULAR | Status: DC | PRN
  Administered 2015-08-13: 4 mg via INTRAVENOUS

## 2015-08-13 SURGICAL SUPPLY — 60 items
CANISTER SUCTION 2500CC (MISCELLANEOUS) ×3 IMPLANT
CATH ANGIO 5F BER2 65CM (CATHETERS) ×2 IMPLANT
CATH BEACON 5.038 65CM KMP-01 (CATHETERS) IMPLANT
CATH OMNI FLUSH .035X70CM (CATHETERS) ×2 IMPLANT
COVER PROBE W GEL 5X96 (DRAPES) ×1 IMPLANT
DEVICE CLOSURE PERCLS PRGLD 6F (VASCULAR PRODUCTS) IMPLANT
DRSG TEGADERM 2-3/8X2-3/4 SM (GAUZE/BANDAGES/DRESSINGS) ×6 IMPLANT
DRYSEAL FLEXSHEATH 12FR 33CM (SHEATH) ×2
DRYSEAL FLEXSHEATH 16FR 33CM (SHEATH) ×2
ELECT REM PT RETURN 9FT ADLT (ELECTROSURGICAL) ×6
ELECTRODE REM PT RTRN 9FT ADLT (ELECTROSURGICAL) ×2 IMPLANT
EXCLUDER TNK LEG 23MX12X14 (Endovascular Graft) IMPLANT
EXCLUDER TRUNK LEG 23MX12X14 (Endovascular Graft) ×3 IMPLANT
GAUZE SPONGE 2X2 8PLY STRL LF (GAUZE/BANDAGES/DRESSINGS) ×1 IMPLANT
GLOVE BIOGEL PI IND STRL 6.5 (GLOVE) IMPLANT
GLOVE BIOGEL PI IND STRL 7.0 (GLOVE) IMPLANT
GLOVE BIOGEL PI IND STRL 7.5 (GLOVE) IMPLANT
GLOVE BIOGEL PI INDICATOR 6.5 (GLOVE) ×4
GLOVE BIOGEL PI INDICATOR 7.0 (GLOVE) ×2
GLOVE BIOGEL PI INDICATOR 7.5 (GLOVE) ×2
GLOVE ECLIPSE 6.5 STRL STRAW (GLOVE) ×4 IMPLANT
GLOVE SS BIOGEL STRL SZ 7 (GLOVE) ×1 IMPLANT
GLOVE SUPERSENSE BIOGEL SZ 7 (GLOVE) ×2
GLOVE SURG SS PI 7.5 STRL IVOR (GLOVE) ×2 IMPLANT
GOWN STRL REUS W/ TWL LRG LVL3 (GOWN DISPOSABLE) ×3 IMPLANT
GOWN STRL REUS W/ TWL XL LVL3 (GOWN DISPOSABLE) IMPLANT
GOWN STRL REUS W/TWL LRG LVL3 (GOWN DISPOSABLE) ×6
GOWN STRL REUS W/TWL XL LVL3 (GOWN DISPOSABLE) ×3
GRAFT BALLN CATH 65CM (STENTS) IMPLANT
GRAFT EXCLUDER LEG (Endovascular Graft) ×2 IMPLANT
KIT BASIN OR (CUSTOM PROCEDURE TRAY) ×3 IMPLANT
KIT ROOM TURNOVER OR (KITS) ×3 IMPLANT
LIQUID BAND (GAUZE/BANDAGES/DRESSINGS) ×5 IMPLANT
NDL PERC 18GX7CM (NEEDLE) ×1 IMPLANT
NEEDLE PERC 18GX7CM (NEEDLE) ×3 IMPLANT
NS IRRIG 1000ML POUR BTL (IV SOLUTION) ×3 IMPLANT
PACK ENDOVASCULAR (PACKS) ×3 IMPLANT
PAD ARMBOARD 7.5X6 YLW CONV (MISCELLANEOUS) ×6 IMPLANT
PERCLOSE PROGLIDE 6F (VASCULAR PRODUCTS) ×15
SHEATH AVANTI 11CM 8FR (MISCELLANEOUS) ×2 IMPLANT
SHEATH BRITE TIP 8FR 23CM (MISCELLANEOUS) ×2 IMPLANT
SHEATH DRYSEAL FLEX 12FR 33CM (SHEATH) IMPLANT
SHEATH DRYSEAL FLEX 16FR 33CM (SHEATH) IMPLANT
SHIELD RADPAD SCOOP 12X17 (MISCELLANEOUS) ×4 IMPLANT
SPONGE GAUZE 2X2 STER 10/PKG (GAUZE/BANDAGES/DRESSINGS) ×4
STENT GRAFT BALLN CATH 65CM (STENTS) ×2
STOPCOCK MORSE 400PSI 3WAY (MISCELLANEOUS) ×3 IMPLANT
SUT PROLENE 5 0 C 1 24 (SUTURE) IMPLANT
SUT PROLENE 5 0 CC 1 (SUTURE) IMPLANT
SUT PROLENE 6 0 C 1 30 (SUTURE) IMPLANT
SUT VIC AB 2-0 CT1 27 (SUTURE)
SUT VIC AB 2-0 CT1 TAPERPNT 27 (SUTURE) IMPLANT
SUT VIC AB 3-0 SH 27 (SUTURE)
SUT VIC AB 3-0 SH 27X BRD (SUTURE) IMPLANT
SUT VICRYL 4-0 PS2 18IN ABS (SUTURE) ×6 IMPLANT
SYR 30ML LL (SYRINGE) ×3 IMPLANT
TRAY FOLEY W/METER SILVER 16FR (SET/KITS/TRAYS/PACK) ×3 IMPLANT
TUBING HIGH PRESSURE 120CM (CONNECTOR) ×3 IMPLANT
WIRE AMPLATZ SS-J .035X180CM (WIRE) ×4 IMPLANT
WIRE BENTSON .035X145CM (WIRE) ×4 IMPLANT

## 2015-08-13 NOTE — Anesthesia Postprocedure Evaluation (Signed)
  Anesthesia Post-op Note  Patient: [name]  Procedure(s) Performed: Procedure(s) (LRB): ABDOMINAL AORTIC ENDOVASCULAR STENT GRAFT (N/A)  Patient Location: PACU  Anesthesia Type: General  Level of Consciousness: awake and alert   Airway and Oxygen Therapy: Patient Spontanous Breathing  Post-op Pain: mild  Post-op Assessment: Post-op Vital signs reviewed, Patient's Cardiovascular Status Stable, Respiratory Function Stable, Patent Airway and No signs of Nausea or vomiting  Last Vitals:  Filed Vitals:   08/13/15 1336  BP: 138/64  Pulse:   Temp: 36.4 C  Resp: 18    Post-op Vital Signs: stable   Complications: No apparent anesthesia complications

## 2015-08-13 NOTE — Op Note (Signed)
    Patient name: STERLIN KNIGHTLY MRN: [id number] DOB: May 18, 1936 Sex: male  08/13/2015 Pre-operative Diagnosis: AAA Post-operative diagnosis:  Same Surgeon:  Annamarie Major Co-Surgeon:  Victorino Dike Procedure:   EVAR Anesthesia:  General Blood Loss:  See anesthesia record Specimens:  none  Findings:  Complete exclusion  Indications:  Patient was seen and evaluated by Dr Kellie Simmering who recommended EVAR  Procedure:  The patient was identified in the holding area and taken to [place] [address]  The patient was then placed supine on the table. general anesthesia was administered.  The patient was prepped and draped in the usual sterile fashion.  A time out was called and antibiotics were administered.  I assisted with ultrasound guided percutaneous common femoral access, and positioning of the main body.   I also performed deployment of the contralateral limb and closure of the arteriotomy sites and closure of the left groin   Disposition:  To PACU in stable condition.   Theotis Burrow, M.D. [facility] Office: [phone] Pager:  [phone]

## 2015-08-13 NOTE — Interval H&P Note (Signed)
History and Physical Interval Note:  08/13/2015 8:12 AM  [name]  has presented today for surgery, with the diagnosis of Abdominal aortic aneurysm I71.4  The various methods of treatment have been discussed with the patient and family. After consideration of risks, benefits and other options for treatment, the patient has consented to  Procedure(s): ABDOMINAL AORTIC ENDOVASCULAR STENT GRAFT (N/A) as a surgical intervention .  The patient's history has been reviewed, patient examined, no change in status, stable for surgery.  I have reviewed the patient's chart and labs.  Questions were answered to the patient's satisfaction.     Tinnie Gens

## 2015-08-13 NOTE — Transfer of Care (Signed)
Immediate Anesthesia Transfer of Care Note  Patient: [name]  Procedure(s) Performed: Procedure(s): ABDOMINAL AORTIC ENDOVASCULAR STENT GRAFT (N/A)  Patient Location: PACU  Anesthesia Type:General  Level of Consciousness: awake, alert , oriented and patient cooperative  Airway & Oxygen Therapy: Patient Spontanous Breathing and Patient connected to nasal cannula oxygen  Post-op Assessment: Report given to RN and Post -op Vital signs reviewed and stable  Post vital signs: Reviewed and stable  Last Vitals:  Filed Vitals:   08/13/15 0709  BP: 152/68  Pulse: 63  Temp: 36.4 C  Resp: 20    Complications: No apparent anesthesia complications

## 2015-08-13 NOTE — Anesthesia Procedure Notes (Signed)
Procedure Name: Intubation Date/Time: 08/13/2015 9:00 AM Performed by: Salli Quarry Nyella Eckels Pre-anesthesia Checklist: Patient identified, Emergency Drugs available, Suction available and Patient being monitored Patient Re-evaluated:Patient Re-evaluated prior to inductionOxygen Delivery Method: Circle system utilized Preoxygenation: Pre-oxygenation with 100% oxygen Intubation Type: IV induction Ventilation: Mask ventilation without difficulty Laryngoscope Size: Mac and 3 Grade View: Grade I Tube type: Oral Tube size: 8.0 mm Number of attempts: 1 Airway Equipment and Method: Stylet Placement Confirmation: ETT inserted through vocal cords under direct vision,  positive ETCO2 and breath sounds checked- equal and bilateral Secured at: 23 cm Tube secured with: Tape Dental Injury: Teeth and Oropharynx as per pre-operative assessment

## 2015-08-13 NOTE — Progress Notes (Signed)
  Day of Surgery Note    Subjective:  No complaints except his right arm where they were trying to get an artery.  States he's hungry.  Filed Vitals:   08/13/15 1336  BP: 138/64  Pulse:   Temp: 97.6 F (36.4 C)  Resp: 18    Incisions:   Bilateral groins are soft without hematoma Extremities:  Bilateral feet are warm with 1+ palpable left DP and 2+ palpable right DP Cardiac:  regular Lungs:  Non labored Abdomen:  Soft, non distended, non tender  Assessment/Plan:  This is a 79 y.o. male who is s/p  #1 bilateral percutaneous access common femoral arteries #2 insertion aorto by common iliac Gore-C3-endovascular stent graft using  A-23 x 12 mm x 14 cm main body-right  B-14 mm x 10 cm contralateral limb-left #3 repair bilateral common femoral arteries using Perclose device 2 bilaterally #4 completion angiography  -pt doing well this afternoon.  He has palpable pedal pulses -if pt does well tonight, anticipate discharge tomorrow -he will f/u with Dr. Kellie Simmering in 4 weeks with CTA protocol.   Leontine Locket, PA-C 08/13/2015 2:18 PM

## 2015-08-13 NOTE — Progress Notes (Signed)
UR COMPLETED  

## 2015-08-13 NOTE — Anesthesia Preprocedure Evaluation (Addendum)
Anesthesia Evaluation  Patient identified by MRN, date of birth, ID band Patient awake  General Assessment Comment:Clear liquids @ 1230  Reviewed: Allergy & Precautions, H&P , NPO status , Patient's Chart, lab work & pertinent test results, reviewed documented beta blocker date and time   History of Anesthesia Complications Negative for: history of anesthetic complications  Airway Mallampati: I  TM Distance: >3 FB Neck ROM: Full    Dental  (+) Partial Upper, Dental Advisory Given, Partial Lower, Missing,    Pulmonary shortness of breath and with exertion, sleep apnea and Continuous Positive Airway Pressure Ventilation , former smoker,    Pulmonary exam normal breath sounds clear to auscultation       Cardiovascular Exercise Tolerance: Poor hypertension, Pt. on medications and Pt. on home beta blockers + CAD, + Past MI, + Cardiac Stents, + Peripheral Vascular Disease and +CHF  Normal cardiovascular exam Rhythm:Regular Rate:Normal  CAD (S/p CABG (LIMA-LAD, SVG-OM), s/p multiple stents/PCI), HTN, hyperlipidemia, AAA  Echo 07/29/15:  - Left ventricle: The cavity size was normal. Wall thickness was normal. Systolic function was normal. The estimated ejection fraction was in the range of 55% to 60%. Wall motion was normal; there were no regional wall motion abnormalities. Doppler parameters are consistent with abnormal left ventricular relaxation (grade 1 diastolic dysfunction). The E/e&' ratio is >15, suggesting elevated LV filling pressure. - Aortic valve: Trileaflet; mildly calcified leaflets. Mean gradient (S): 10 mm Hg. Peak gradient (S): 18 mm Hg. Valve area (VTI): 1.42 cm^2. Valve area (Vmax): 1.51 cm^2. Valve area (Vmean): 1.47 cm^2. - Mitral valve: Calcified annulus. There was mild regurgitation. - Left atrium: Moderately dilated at 45 ml/m2. - Right atrium: The atrium was mildly dilated. - Tricuspid valve: There was mild  regurgitation. - Pulmonary arteries: Dilated. PA peak pressure: 30 mm Hg (S). - Inferior vena cava: The vessel was normal in size. The respirophasic diameter changes were in the normal range (= 50%), consistent with normal central venous pressure. - Impressions: Compared to a prior echo in 2013, there is now mild to moderateaortic stenosis - AVA around 1.5 cm2.   Neuro/Psych negative neurological ROS  negative psych ROS   GI/Hepatic Neg liver ROS, GERD  Medicated and Controlled,  Endo/Other  diabetes, Well Controlled, Type 2, Oral Hypoglycemic Agents  Renal/GU Renal InsufficiencyRenal disease     Musculoskeletal  (+) Arthritis , Osteoarthritis,    Abdominal   Peds  Hematology  (+) Blood dyscrasia, anemia ,   Anesthesia Other Findings   Reproductive/Obstetrics                           Anesthesia Physical Anesthesia Plan  ASA: III  Anesthesia Plan: General   Post-op Pain Management:    Induction: Intravenous  Airway Management Planned: Oral ETT  Additional Equipment: Arterial line  Intra-op Plan:   Post-operative Plan: Extubation in OR  Informed Consent: I have reviewed the patients History and Physical, chart, labs and discussed the procedure including the risks, benefits and alternatives for the proposed anesthesia with the patient or authorized representative who has indicated his/her understanding and acceptance.   Dental advisory given  Plan Discussed with: CRNA  Anesthesia Plan Comments: (Risks/benefits of general anesthesia discussed with patient including risk of damage to teeth, lips, gum, and tongue, nausea/vomiting, allergic reactions to medications, and the possibility of heart attack, stroke and death.  All patient questions answered.  Patient wishes to proceed.  2PIV, A-Line, [place])  Anesthesia Quick Evaluation  

## 2015-08-13 NOTE — Discharge Summary (Signed)
[facility] Discharge Summary   [name] [birth year] 79 y.o. male  MRN: [id number]  Admission Date: 08/13/2015  Discharge Date: 08/14/2015  Physician: Mal Misty, MD  Admission Diagnosis: Abdominal aortic aneurysm I71.4   HPI:   This is a 79 y.o. male was referred by Dr. Glenetta Hew for evaluation of enlarging abdominal aortic aneurysm. Patient has had this aneurysm followed over the past few years and it has enlarged from 4 cm to its current diameter of 4.9-5 cm by recent CT scan and ultrasound. In July of this year it measured 4.4 x 4.5 cm. Patient does have a history of coronary artery disease with previous coronary artery bypass grafting in 1997 and has had stents placed in the interim. He was seen at Dr. Allison Quarry office 06/10/2015 and is currently doing well with an ejection fraction of 45%. He denies active chest pain or cardiac symptoms. He did have a recent cholecystectomy performed by Dr. Alphonsa Overall and required a biliary stent and removal of a stone by Dr. Fuller Plan. Currently he is doing well.   Hospital Course:  The patient was admitted to the hospital and taken to the operating room on 08/13/2015 and underwent: #1 bilateral percutaneous access common femoral arteries #2 insertion aorto by common iliac Gore-C3-endovascular stent graft using  A-23 x 12 mm x 14 cm main body-right  B-14 mm x 10 cm contralateral limb-left #3 repair bilateral common femoral arteries using Perclose device 2 bilaterally #4 completion angiography    The pt tolerated the procedure well and was transported to the PACU in good condition.   POD#1  Ambulating, tolerating PO's well and voided.   Disposition stable Discharge home CTA at 4 week follow up.  The remainder of the hospital course consisted of increasing mobilization and increasing intake of solids without difficulty.  CBC    Component Value Date/Time   WBC 4.2 08/13/2015 1117   RBC 2.80* 08/13/2015 1117   HGB 8.7*  08/13/2015 1117   HCT 26.6* 08/13/2015 1117   PLT 160 08/13/2015 1117   MCV 95.0 08/13/2015 1117   MCH 31.1 08/13/2015 1117   MCHC 32.7 08/13/2015 1117   RDW 16.1* 08/13/2015 1117   LYMPHSABS 1.4 05/21/2015 1018   MONOABS 0.4 05/21/2015 1018   EOSABS 0.0 05/21/2015 1018   BASOSABS 0.0 05/21/2015 1018    BMET    Component Value Date/Time   NA 138 08/13/2015 1117   K 4.2 08/13/2015 1117   CL 110 08/13/2015 1117   CO2 24 08/13/2015 1117   GLUCOSE 106* 08/13/2015 1117   BUN 13 08/13/2015 1117   CREATININE 1.28* 08/13/2015 1117   CREATININE 1.31 01/09/2015 0927   CALCIUM 8.4* 08/13/2015 1117   GFRNONAA 52* 08/13/2015 1117   GFRAA >60 08/13/2015 1117       Discharge Instructions    ABDOMINAL PROCEDURE/ANEURYSM REPAIR/AORTO-BIFEMORAL BYPASS:  Call MD for increased abdominal pain; cramping diarrhea; nausea/vomiting    Complete by:  As directed      Call MD for:  redness, tenderness, or signs of infection (pain, swelling, bleeding, redness, odor or green/yellow discharge around incision site)    Complete by:  As directed      Call MD for:  severe or increased pain, loss or decreased feeling  in affected limb(s)    Complete by:  As directed      Call MD for:  temperature >100.5    Complete by:  As directed      Discharge instructions  Complete by:  As directed   Resume your Metformin on Saturday, August 16, 2015.     Discharge wound care:    Complete by:  As directed   Shower daily with soap and water starting 08/15/15     Driving Restrictions    Complete by:  As directed   No driving for 2 weeks     Lifting restrictions    Complete by:  As directed   No lifting for 4 weeks     Resume previous diet    Complete by:  As directed            Discharge Diagnosis:  Abdominal aortic aneurysm I71.4  Secondary Diagnosis: Patient Active Problem List   Diagnosis Date Noted  . AAA (abdominal aortic aneurysm) ([place]) 08/13/2015  . Preoperative cardiovascular examination  07/23/2015  . Calculus of bile duct with obstruction and without cholangitis or cholecystitis   . Obstruction of biliary stent   . Cholelithiasis with cholecystitis 05/22/2015  . History of unstable angina 01/08/2015  . Presence of drug coated stent in right coronary artery and circumflex coronary artery 01/08/2015  . Medication management 01/08/2015  . Chronic renal insufficiency, stage III (moderate) 06/06/2014  . Coronary stent restenosis with uncertain cause: Required PCI for ISR of OM1 (Promus P 2.75 mm x 12 mm) & pRCA 05/17/2014  . Obesity (BMI 30-39.9) 06/28/2013  . Encounter for long-term (current) use of other medications 06/28/2013  . AAA (abdominal aortic aneurysm) without rupture ([place]) 04/16/2013  . ACE-inhibitor cough 04/16/2013  . Chronic diastolic CHF (congestive heart failure) ([place]) 11/24/2012  . Type II diabetes mellitus with complication -- CAD, AAA 03/04/2011    Class: Diagnosis of  . Hypertension associated with diabetes ([place]) 03/04/2011  . Hyperlipidemia LDL goal <70; statin intolerant 03/04/2011    Class: Diagnosis of  . ED (erectile dysfunction) of organic origin 03/04/2011  . GERD (gastroesophageal reflux disease) 03/04/2011  . S/P CABG x 2: 1997. SVG-OM (known to be occluded), LIMA-LAD 06/28/1996  . History of ST elevation myocardial infarction (STEMI) of inferolateral wall (PTCA - 100% large lateral OM) 06/28/1996    Class: History of  . CAD S/P CABG '95- several PCis since, last 05/17/14 06/28/1994    Class: History of   Past Medical History  Diagnosis Date  . History of: ST elevation myocardial infarction (STEMI) involving left circumflex coronary artery with complication 8250    PTCA-circumflex; PCI in 1991  . CAD, multiple vessel 1985-2010    Most recent cath 01/01/09: 100% Occluded LAD after SP1, patent LIMA-distal LAD with apical 90% lesion.  Cx-OM1 widely patent stent into proximal bifurcating OM1 . Follow-on Cx => 70-80% -- non-amenable PCI. Widely  patent 3 overlapping stents in midRCA with only 40% RPL stenosis; SVG- OM known to be occluded.  . S/P CABG x 2 1997    LIMA-LAD, SVG-OM  . H/O unstable angina 06/2003, 12/2006, 05/2014    a) '04: Staged Taxus DES PCI to RCA and Cx-OM;;'08 - PCI to proximal ISR in RCA - Cypher DES; c) 05/2014: ISR in RCA & OM1, PCI with Promus P DES: RCA  2.75 x 12, OM1 3.0 mm x 8)  . History of nuclear stress test December 2013    LOW at Risk. Moderate region of mid to basal inferolateral scar without ischemia -- consistent with distal circumflex disease. Mild apical hypokinesis with an EF of 47%.  . Irregular heart beat     PVCs and PACs, no arrhythmia recorded  .  Exertional dyspnea     Chronic baseline SOB with ambulation  . Diabetes mellitus     Not on oral medication  . Hypertension   . Dyslipidemia, goal LDL below 70   . Osteoarthritis of both knees     And back; multiple back surgeries, right knee arthroplasty and left knee arthroscopic surgery x2  . Chronic back pain      multiple surgeries; C-spine and lumbar  . Erectile dysfunction   . H/O: pneumonia February 14  . Chronic anemia     On iron supplement; history of positive guaiac - negative colonoscopy in 1996.; Thought to be related to hemorrhoids; status post hemorrhoidectomy  . Ankle edema     Chronic  . Diverticulitis of colon 1996  . Choledocholithiasis   . Hemorrhoids   . Diverticulosis   . Adenomatous colon polyp   . Hiatal hernia   . Cholelithiasis - with cholangitis     status post ERCP with removal of calculi and biliary stent placement.  . Sleep apnea     pt. states he was told to return for f/u, to be fitted for Cpap, but pt. reports that he didn't follow up-no cpap used  . AAA (abdominal aortic aneurysm) ([place]) 08/30/12    Doppler 08/30/12 had a small amount of growth. It was a 4.2 x 4.3 cm greater in size than the previous one noted at 4 x 3.8.  05-21-15 followed by CT abd-Dr. Kellie Simmering.  Marland Kitchen GERD (gastroesophageal reflux disease)          Medication List    TAKE these medications        acetaminophen 500 MG tablet  Commonly known as:  TYLENOL  Take 1,000 mg by mouth every 6 (six) hours as needed for mild pain, moderate pain or headache.     amLODipine 5 MG tablet  Commonly known as:  NORVASC  TAKE 5 MG BY MOUTH DAILY     aspirin 81 MG tablet  Take 81 mg by mouth daily.     clopidogrel 75 MG tablet  Commonly known as:  PLAVIX  TAKE ONE TABLET BY MOUTH ONCE DAILY     ezetimibe 10 MG tablet  Commonly known as:  ZETIA  Take 1 tablet (10 mg total) by mouth daily.     Fenofibric Acid 135 MG Cpdr  TAKE ONE CAPSULE BY MOUTH ONCE DAILY     ferrous sulfate 325 (65 FE) MG tablet  Take 325 mg by mouth daily with breakfast.     losartan 50 MG tablet  Commonly known as:  COZAAR  TAKE ONE TABLET BY MOUTH ONCE DAILY     multivitamin with minerals tablet  Take 1 tablet by mouth daily.     oxyCODONE 5 MG immediate release tablet  Commonly known as:  ROXICODONE  Take 1 tablet (5 mg total) by mouth every 6 (six) hours as needed.     pantoprazole 40 MG tablet  Commonly known as:  PROTONIX  TAKE ONE TABLET BY MOUTH ONCE DAILY     pioglitazone-metformin 15-500 MG tablet  Commonly known as:  ACTOPLUS MET  TAKE ONE TABLET BY MOUTH ONCE DAILY     sildenafil 20 MG tablet  Commonly known as:  REVATIO  Take 2-5 pills daily as needed        Prescriptions given: Roxicodone #20 No Refill  Instructions: 1.  No driving x 2 weeks. 2.  No heavy lifting x 4 weeks  Disposition: Stable  Patient's condition: is Good  Follow up: 1. Dr. Kellie Simmering in 4 weeks with CTA   Theda Sers Blum PA-C [facility] [phone] 08/13/2015  2:20 PM   - For VQI Registry use --- Instructions: Press F2 to tab through selections.  Delete question if not applicable.   Post-op:  Time to Extubation: '[x]'  In OR, '[ ]'  < 12 hrs, '[ ]'  12-24 hrs, '[ ]'  >=24 hrs Vasopressors Req. Post-op: No MI: No., '[ ]'   Troponin only, '[ ]'  EKG or Clinical New Arrhythmia: No CHF: No ICU Stay: 0 day in step down ICU 1 Transfusion: No  If yes, 0 units given  Complications: Resp failure: No., '[ ]'  Pneumonia, '[ ]'  Ventilator Chg in renal function: No., '[ ]'  Inc. Cr > 0.5, '[ ]'  Temp. Dialysis, '[ ]'  Permanent dialysis Leg ischemia: No., no Surgery needed, '[ ]'  Yes, Surgery needed, '[ ]'  Amputation Bowel ischemia: No., '[ ]'  Medical Rx, '[ ]'  Surgical Rx Wound complication: No., '[ ]'  Superficial separation/infection, '[ ]'  Return to OR Return to OR: No  Return to OR for bleeding: No Stroke: No., '[ ]'  Minor, '[ ]'  Major  Discharge medications: Statin use:  No If No: [ x] For Medical reasons, '[ ]'  Non-compliant, '[ ]'  Not-indicated ASA use:  Yes  If No: '[ ]'  For Medical reasons, '[ ]'  Non-compliant, '[ ]'  Not-indicated Plavix use:  yes If No: '[ ]'  For Medical reasons, '[ ]'  Non-compliant, '[ ]'  Not-indicated Beta blocker use:  No If No: '[ ]'  For Medical reasons, '[ ]'  Non-compliant, '[ ]'  Not-indicated ACEI use:  No ARB use:  Yes CCB use:  yes

## 2015-08-13 NOTE — Op Note (Signed)
OPERATIVE REPORT  Date of Surgery: 08/13/2015  Surgeon: Tinnie Gens, MD and Dr. Annamarie Major  Pre-op Diagnosis: Abdominal aortic aneurysm I71.4  Post-op Diagnosis: Abdominal aortic aneurysm I71.4  Procedure: Procedure( #1 bilateral percutaneous access common femoral arteries #2 insertion aorto by common iliac Gore-C3-endovascular stent graft using    A-23 x 12 mm x 14 cm main body-right    B-14 mm x 10 cm contralateral limb-left #3 repair bilateral common femoral arteries using Perclose device 2 bilaterally #4 completion angiography  Anesthesia: General  EBL: 086 cc  Complications: None  Procedure Details: The patient was taken the operating room placed in supine position at which time satisfactory general endotracheal anesthesia was administered. The abdomen and groins were prepped Betadine scrub and solution draped in routine sterile manner. Both common femoral arteries were then entered percutaneously with ultrasound guidance without difficulty. 2 Perclose sutures were placed bilaterally one at the 11:00 and one at the 1:00 position. A short 8 French sheath was inserted on the right and the long 8 French sheath on the left and the patient was then heparinized. Using a Kumpe catheter the guidewires were exchanged for Amplatz wires. A   16 French sheath was inserted on the right and a 12 French sheath on the left. The Gore-C3 stent graft device which was a 23 mm x 12 mm x 14 cm was then passed through the 16 mm sheath on the right and positioned in the infrarenal aorta. Angiogram of the perirenal aorta was obtained using 10 cc of contrast at 10 cc/s. Level the renal arteries was marked and the graft was deployed just distal to the left renal artery which was the lowest renal artery. A second angiogram was performed using 10 more cc of contrast to confirm the location of the graft which was in excellent position. Graft was deployed down to the gait. Following this using a buddy wire  technique contralateral gate was easily cannulated. Kumpe catheter was passed into the proximal portion of the graft and exchanged for an Amplatz wire. Retrograde angiogram was performed through the sheath and after appropriate measurements A 14 mm x 10 cm contralateral limb was deployed down to just proximal to the origin of the left internal iliac artery. A retrograde angiogram was then performed on the right side through the sheath to localize the origin of the right internal iliac and the remainder of the right limb of the graft was deployed which terminated about 1-2 cm proximal to the origin of the right internal iliac. All junctions were then dilated with the Q 50 balloon. Completion angiogram was performed using 20 cc of contrast 20 cc/s. This revealed excellent position of the graft no evidence of endoleak. She's were all removed and the Perclose device used bilaterally with excellent hemostasis and repair of bilateral femoral arteries. Her means given to reverse the heparin following adequate hemostasis wounds were closed in layers with Vicryl subcuticular fashion with Dermabond. Patient had a palpable dorsalis pedis pulse in the right foot and posterior tibial. The left foot. Having tolerated the procedure well and was taken recovery room in satisfactory Lyman Bishop, MD 08/13/2015 10:24 AM

## 2015-08-13 NOTE — H&P (View-Only) (Signed)
Subjective:     Patient ID: [name], male   DOB: 18-Sep-1936, 79 y.o.   MRN: [id number]  HPI this 78 year old male was referred by Dr. Glenetta Hew for evaluation of enlarging abdominal aortic aneurysm. Patient has had this aneurysm followed over the past few years and it has enlarged from 4 cm to its current diameter of 4.9-5 cm by recent CT scan and ultrasound. In July of this year it measured 4.4 x 4.5 cm. Patient does have a history of coronary artery disease with previous coronary artery bypass grafting in 1997 and has had stents placed in the interim. He was seen at Dr. Allison Quarry office 06/10/2015 and is currently doing well with an ejection fraction of 45%. He denies active chest pain or cardiac symptoms. He did have a recent cholecystectomy performed by Dr. Alphonsa Overall and required a biliary stent and removal of a stone by Dr. Fuller Plan. Currently he is doing well.  Past Medical History  Diagnosis Date  . History of: ST elevation myocardial infarction (STEMI) involving left circumflex coronary artery with complication 9381    PTCA-circumflex; PCI in 1991  . CAD, multiple vessel 1985-2010    Most recent cath 01/01/09: 100% Occluded LAD after SP1, patent LIMA-distal LAD with apical 90% lesion.  Cx-OM1 widely patent stent into proximal bifurcating OM1 . Follow-on Cx => 70-80% -- non-amenable PCI. Widely patent 3 overlapping stents in midRCA with only 40% RPL stenosis; SVG- OM known to be occluded.  . S/P CABG x 2 1997    LIMA-LAD, SVG-OM  . H/O unstable angina 06/2003, 12/2006, 05/2014    a) '04: Staged Taxus DES PCI to RCA and Cx-OM;;'08 - PCI to proximal ISR in RCA - Cypher DES; c) 05/2014: ISR in RCA & OM1, PCI with Promus P DES: RCA  2.75 x 12, OM1 3.0 mm x 8)  . History of nuclear stress test December 2013    LOW at Risk. Moderate region of mid to basal inferolateral scar without ischemia -- consistent with distal circumflex disease. Mild apical hypokinesis with an EF of 47%.  . Irregular  heart beat     PVCs and PACs, no arrhythmia recorded  . Exertional dyspnea     Chronic baseline SOB with ambulation  . Diabetes mellitus     Not on oral medication  . Hypertension   . Dyslipidemia, goal LDL below 70   . Osteoarthritis of both knees     And back; multiple back surgeries, right knee arthroplasty and left knee arthroscopic surgery x2  . Chronic back pain      multiple surgeries; C-spine and lumbar  . Erectile dysfunction   . H/O: pneumonia February 14  . Chronic anemia     On iron supplement; history of positive guaiac - negative colonoscopy in 1996.; Thought to be related to hemorrhoids; status post hemorrhoidectomy  . Ankle edema     Chronic  . Diverticulitis of colon 1996  . Choledocholithiasis   . Hemorrhoids   . Diverticulosis   . Adenomatous colon polyp   . Hiatal hernia   . Cholelithiasis - with cholangitis     status post ERCP with removal of calculi and biliary stent placement.  . Sleep apnea     pt. states he was told to return for f/u, to be fitted for Cpap, but pt. reports that he didn't follow up-no cpap used  . AAA (abdominal aortic aneurysm) ([place]) 08/30/12    Doppler 08/30/12 had a small amount of growth. It  was a 4.2 x 4.3 cm greater in size than the previous one noted at 4 x 3.8.  05-21-15 followed by CT abd-Dr. Kellie Simmering.    Social History  Substance Use Topics  . Smoking status: Former Smoker    Types: Cigarettes    Quit date: 10/05/1983  . Smokeless tobacco: Never Used  . Alcohol Use: 1.8 oz/week    3 Cans of beer per week     Comment: 3 or more cans of beer per day - 05/21/15    Family History  Problem Relation Age of Onset  . Arthritis Mother   . Diabetes Father   . Heart disease Father   . Stroke Sister   . Hypertension Sister   . Heart disease Sister   . Diabetes Sister   . Breast cancer Sister   . Heart disease Brother   . Ulcers Brother   . Colon cancer Neg Hx     Allergies  Allergen Reactions  . Lisinopril Cough    Rxn:  unknown  . Statins Other (See Comments)    Muscle ache  . Welchol [Colesevelam Hcl] Itching     Current outpatient prescriptions:  .  acetaminophen (TYLENOL) 500 MG tablet, Take 1,000 mg by mouth every 6 (six) hours as needed for mild pain, moderate pain or headache., Disp: , Rfl:  .  amLODipine (NORVASC) 5 MG tablet, TAKE 5 MG BY MOUTH DAILY, Disp: 90 tablet, Rfl: 3 .  aspirin 81 MG tablet, Take 81 mg by mouth daily., Disp: , Rfl:  .  Choline Fenofibrate (FENOFIBRIC ACID) 135 MG CPDR, TAKE ONE CAPSULE BY MOUTH ONCE DAILY (Patient taking differently: TAKE 135 MG BY MOUTH DAILY), Disp: 90 capsule, Rfl: 3 .  clopidogrel (PLAVIX) 75 MG tablet, TAKE ONE TABLET BY MOUTH ONCE DAILY (Patient taking differently: TAKE 75 MG BY MOUTH ONCE DAILY), Disp: 90 tablet, Rfl: 3 .  ezetimibe (ZETIA) 10 MG tablet, Take 1 tablet (10 mg total) by mouth daily., Disp: 90 tablet, Rfl: 2 .  ferrous sulfate 325 (65 FE) MG tablet, Take 325 mg by mouth daily with breakfast., Disp: , Rfl:  .  losartan (COZAAR) 50 MG tablet, TAKE ONE TABLET BY MOUTH ONCE DAILY, Disp: 90 tablet, Rfl: 0 .  Multiple Vitamins-Minerals (MULTIVITAMIN WITH MINERALS) tablet, Take 1 tablet by mouth daily. , Disp: , Rfl:  .  pantoprazole (PROTONIX) 40 MG tablet, TAKE ONE TABLET BY MOUTH ONCE DAILY (Patient taking differently: TAKE 40 MG BY MOUTH DAILY), Disp: 90 tablet, Rfl: 3 .  pioglitazone-metformin (ACTOPLUS MET) 15-500 MG per tablet, TAKE ONE TABLET BY MOUTH ONCE DAILY, Disp: 90 tablet, Rfl: 0 .  sildenafil (REVATIO) 20 MG tablet, Take 2-5 pills daily as needed (Patient taking differently: Take 40-100 mg by mouth daily as needed (erectile dysfunction). Take 2-5 pills daily as needed), Disp: 50 tablet, Rfl: 5  Filed Vitals:   07/22/15 1005  BP: 123/68  Pulse: 66  Temp: 97.8 F (36.6 C)  Resp: 16  Height: 5' 9" (1.753 m)  Weight: 209 lb (94.802 kg)  SpO2: 100%    Body mass index is 30.85 kg/(m^2).           Review of Systems  denies chest pain, PND, orthopnea. Does have occasional dyspnea on exertion but denies claudication type symptoms. Has left knee pain due to arthritis. Other systems negative and a complete review of systems other than history of present illness     Objective:   Physical Exam BP 123/68 mmHg  Pulse 66  Temp(Src) 97.8 F (36.6 C)  Resp 16  Ht 5' 9" (1.753 m)  Wt 209 lb (94.802 kg)  BMI 30.85 kg/m2  SpO2 100%  Gen.-alert and oriented x3 in no apparent distress HEENT normal for age Lungs no rhonchi or wheezing Cardiovascular regular rhythm no murmurs carotid pulses 3+ palpable no bruits audible Abdomen soft nontender-5 cm pulsatile mass noted Musculoskeletal free of  major deformities Skin clear -no rashes Neurologic normal Lower extremities 3+ femoral and dorsalis pedis pulses palpable on the right 3+ femoral and posterior tibial pulse palpable on the left Popliteal pulses palpable easily bilaterally  Today I reviewed the ultrasound report from 04/18/2015 which revealed the aneurysm to be 4.4 x 4.5 cm. I also reviewed the CT scan and the images by computer and measured the aneurysm which measures approximately 5 cm in maximum diameter and has a long infrarenal neck. The aneurysm is quite saccular in configuration.       Assessment:     #1 enlarging infrarenal abdominal aortic aneurysm currently measuring approximately 5 cm in maximum diameter which is quite saccular in nature-has enlarged from 4.5 cm in the past few months #2 coronary artery disease currently stable with history of coronary artery bypass grafting and stent placement-followed by Dr. Glenetta Hew #3 history of recent cholecystectomy with removal of stone and biliary stent #4 hypertension well controlled  I discussed the aneurysm and its dimensions with the patient and his wife and he would like to proceed with aortic stent grafting or open repair if needed. I think the patient is an excellent The for aortic stent  grafting    Plan:     Will refer back to Dr. Glenetta Hew for cardiac clearance although I do not think open repair will be required Have tentatively scheduled aortic stent graft repair on Wednesday, November 9 Risks and benefits thoroughly discussed with patient and his wife and they would like to proceed Will await Dr. Allison Quarry comments regarding cardiac risk

## 2015-08-14 ENCOUNTER — Telehealth: Payer: Self-pay | Admitting: Vascular Surgery

## 2015-08-14 ENCOUNTER — Encounter (HOSPITAL_COMMUNITY): Payer: Self-pay | Admitting: Vascular Surgery

## 2015-08-14 LAB — TYPE AND SCREEN
ABO/RH(D): A POS
Antibody Screen: NEGATIVE
UNIT DIVISION: 0
UNIT DIVISION: 0

## 2015-08-14 LAB — CBC
HCT: 27.2 % — ABNORMAL LOW (ref 39.0–52.0)
Hemoglobin: 8.7 g/dL — ABNORMAL LOW (ref 13.0–17.0)
MCH: 30.2 pg (ref 26.0–34.0)
MCHC: 32 g/dL (ref 30.0–36.0)
MCV: 94.4 fL (ref 78.0–100.0)
PLATELETS: 163 10*3/uL (ref 150–400)
RBC: 2.88 MIL/uL — AB (ref 4.22–5.81)
RDW: 16 % — ABNORMAL HIGH (ref 11.5–15.5)
WBC: 4.8 10*3/uL (ref 4.0–10.5)

## 2015-08-14 LAB — GLUCOSE, CAPILLARY: GLUCOSE-CAPILLARY: 77 mg/dL (ref 65–99)

## 2015-08-14 LAB — BASIC METABOLIC PANEL
Anion gap: 5 (ref 5–15)
BUN: 13 mg/dL (ref 6–20)
CO2: 22 mmol/L (ref 22–32)
CREATININE: 1.43 mg/dL — AB (ref 0.61–1.24)
Calcium: 8.4 mg/dL — ABNORMAL LOW (ref 8.9–10.3)
Chloride: 109 mmol/L (ref 101–111)
GFR calc Af Amer: 53 mL/min — ABNORMAL LOW (ref >60)
GFR, EST NON AFRICAN AMERICAN: 46 mL/min — AB (ref >60)
Glucose, Bld: 87 mg/dL (ref 65–99)
POTASSIUM: 4.6 mmol/L (ref 3.5–5.1)
SODIUM: 136 mmol/L (ref 135–145)

## 2015-08-14 NOTE — Progress Notes (Signed)
Discussed discharge instructions and medications with patient and patients wife. Both verbalized understanding with all questions answered. VSS. No complaints of pain. Office will call to set up follow up appointment. Pt discharged home with wife.  [facility]

## 2015-08-14 NOTE — Telephone Encounter (Addendum)
-----   Message from Mena Goes, RN sent at 08/13/2015  4:30 PM EST ----- Regarding: schedule   ----- Message -----    From: Gabriel Earing, PA-C    Sent: 08/13/2015   2:16 PM      To: [facility] Charge Pool  S/p [facility] 08/13/15.  F/u with Dr. Kellie Simmering in 4 weeks with CTA protocol.  Thanks, Samantha  notified patient of post op appt. on 09-09-15 and cta appt. on 11-29

## 2015-08-14 NOTE — Progress Notes (Addendum)
[facility] [place]  Subjective  - Doing well ambulating, tolerating PO's well and voided.   Objective 130/67 59 98.1 F (36.7 C) (Oral) 18 99%  Intake/Output Summary (Last 24 hours) at 08/14/15 0743 Last data filed at 08/14/15 0700  Gross per 24 hour  Intake   3090 ml  Output   3680 ml  Net   -590 ml    Palpable DP bilaterally Groins soft without hematoma Heart RRR Lungs non labored breathing  Assessment/Planning: POD # 1 EVAR  Disposition stable Discharge home CTA at 4 week follow up.  Laurence Slate [facility] 08/14/2015 7:43 AM --  Laboratory Lab Results:  Recent Labs  08/13/15 1117 08/14/15 0410  WBC 4.2 4.8  HGB 8.7* 8.7*  HCT 26.6* 27.2*  PLT 160 163   BMET  Recent Labs  08/13/15 1117 08/14/15 0410  NA 138 136  K 4.2 4.6  CL 110 109  CO2 24 22  GLUCOSE 106* 87  BUN 13 13  CREATININE 1.28* 1.43*  CALCIUM 8.4* 8.4*    COAG Lab Results  Component Value Date   INR 1.19 08/13/2015   INR 1.11 08/05/2015   INR 1.23 06/28/2014   No results found for: PTT  Doing well this a.m. Both inguinal areas are soft without hematoma 3+ right DP and 2+ left PT pulse palpable No abdominal discomfort  Plan DC today and return in 4 weeks with CT angiogram

## 2015-08-14 NOTE — Telephone Encounter (Addendum)
-----   Message from Mena Goes, RN sent at 08/13/2015  4:30 PM EST ----- Regarding: schedule   ----- Message -----    From: Gabriel Earing, PA-C    Sent: 08/13/2015   2:16 PM      To: Vvs Charge Pool  S/p EVAR 08/13/15.  F/u with Dr. Kellie Simmering in 4 weeks with CTA protocol.  Thanks, Tilman Neat - s/p EVAR 08-13-15 4 wk fu with cta abd/pelvis prior to seeing MD as per staff message, notified patient, sent to debbie for prior auth  08-13-15   bar

## 2015-08-26 DIAGNOSIS — H26492 Other secondary cataract, left eye: Secondary | ICD-10-CM | POA: Diagnosis not present

## 2015-08-26 DIAGNOSIS — H26491 Other secondary cataract, right eye: Secondary | ICD-10-CM | POA: Diagnosis not present

## 2015-08-26 DIAGNOSIS — H40013 Open angle with borderline findings, low risk, bilateral: Secondary | ICD-10-CM | POA: Diagnosis not present

## 2015-09-01 ENCOUNTER — Inpatient Hospital Stay: Admission: RE | Admit: 2015-09-01 | Payer: Medicare Other | Source: Ambulatory Visit

## 2015-09-02 ENCOUNTER — Ambulatory Visit
Admission: RE | Admit: 2015-09-02 | Discharge: 2015-09-02 | Disposition: A | Discharge status: Home / Self Care | Payer: Medicare Other | Source: Ambulatory Visit | Attending: Vascular Surgery | Admitting: Vascular Surgery

## 2015-09-02 DIAGNOSIS — I714 Abdominal aortic aneurysm, without rupture, unspecified: Secondary | ICD-10-CM

## 2015-09-02 DIAGNOSIS — Z48812 Encounter for surgical aftercare following surgery on the circulatory system: Secondary | ICD-10-CM

## 2015-09-02 MED ORDER — IOPAMIDOL (ISOVUE-370) INJECTION 76%
75.0000 mL | Freq: Once | INTRAVENOUS | Status: AC | PRN
  Administered 2015-09-02: 75 mL via INTRAVENOUS

## 2015-09-04 ENCOUNTER — Other Ambulatory Visit: Payer: Self-pay | Admitting: Cardiology

## 2015-09-05 ENCOUNTER — Encounter: Payer: Self-pay | Admitting: Medical

## 2015-09-05 ENCOUNTER — Ambulatory Visit
Admission: RE | Admit: 2015-09-05 | Discharge: 2015-09-05 | Disposition: A | Discharge status: Home / Self Care | Payer: Medicare Other | Source: Ambulatory Visit | Attending: Medical | Admitting: Medical

## 2015-09-05 ENCOUNTER — Encounter: Payer: Self-pay | Admitting: Vascular Surgery

## 2015-09-05 ENCOUNTER — Ambulatory Visit (INDEPENDENT_AMBULATORY_CARE_PROVIDER_SITE_OTHER): Payer: Medicare Other | Admitting: Medical

## 2015-09-05 ENCOUNTER — Telehealth: Payer: Self-pay

## 2015-09-05 VITALS — BP 130/80 | HR 94 | Temp 98.2°F | Wt 216.0 lb

## 2015-09-05 DIAGNOSIS — R05 Cough: Secondary | ICD-10-CM

## 2015-09-05 DIAGNOSIS — R059 Cough, unspecified: Secondary | ICD-10-CM

## 2015-09-05 DIAGNOSIS — N183 Chronic kidney disease, stage 3 unspecified: Secondary | ICD-10-CM

## 2015-09-05 DIAGNOSIS — Z9861 Coronary angioplasty status: Secondary | ICD-10-CM | POA: Diagnosis not present

## 2015-09-05 DIAGNOSIS — I714 Abdominal aortic aneurysm, without rupture, unspecified: Secondary | ICD-10-CM

## 2015-09-05 DIAGNOSIS — J189 Pneumonia, unspecified organism: Secondary | ICD-10-CM

## 2015-09-05 DIAGNOSIS — E118 Type 2 diabetes mellitus with unspecified complications: Secondary | ICD-10-CM | POA: Diagnosis not present

## 2015-09-05 DIAGNOSIS — N189 Chronic kidney disease, unspecified: Secondary | ICD-10-CM | POA: Diagnosis not present

## 2015-09-05 DIAGNOSIS — I2510 Atherosclerotic heart disease of native coronary artery without angina pectoris: Secondary | ICD-10-CM

## 2015-09-05 DIAGNOSIS — R0602 Shortness of breath: Secondary | ICD-10-CM | POA: Diagnosis not present

## 2015-09-05 LAB — CBC WITH DIFFERENTIAL/PLATELET
Basophils Absolute: 0 10*3/uL (ref 0.0–0.1)
Basophils Relative: 0 % (ref 0–1)
EOS ABS: 0 10*3/uL (ref 0.0–0.7)
Eosinophils Relative: 1 % (ref 0–5)
HCT: 30.1 % — ABNORMAL LOW (ref 39.0–52.0)
Hemoglobin: 9.9 g/dL — ABNORMAL LOW (ref 13.0–17.0)
LYMPHS ABS: 1.2 10*3/uL (ref 0.7–4.0)
Lymphocytes Relative: 28 % (ref 12–46)
MCH: 30.7 pg (ref 26.0–34.0)
MCHC: 32.9 g/dL (ref 30.0–36.0)
MCV: 93.5 fL (ref 78.0–100.0)
MONO ABS: 0.4 10*3/uL (ref 0.1–1.0)
MONOS PCT: 8 % (ref 3–12)
MPV: 10.7 fL (ref 8.6–12.4)
NEUTROS PCT: 63 % (ref 43–77)
Neutro Abs: 2.8 10*3/uL (ref 1.7–7.7)
PLATELETS: 177 10*3/uL (ref 150–400)
RBC: 3.22 MIL/uL — ABNORMAL LOW (ref 4.22–5.81)
RDW: 16.1 % — ABNORMAL HIGH (ref 11.5–15.5)
WBC: 4.4 10*3/uL (ref 4.0–10.5)

## 2015-09-05 LAB — BASIC METABOLIC PANEL
BUN: 17 mg/dL (ref 7–25)
CALCIUM: 8.8 mg/dL (ref 8.6–10.3)
CO2: 25 mmol/L (ref 20–31)
CREATININE: 1.64 mg/dL — AB (ref 0.70–1.18)
Chloride: 106 mmol/L (ref 98–110)
Glucose, Bld: 103 mg/dL — ABNORMAL HIGH (ref 65–99)
POTASSIUM: 4.3 mmol/L (ref 3.5–5.3)
SODIUM: 138 mmol/L (ref 135–146)

## 2015-09-05 MED ORDER — ALBUTEROL SULFATE HFA 108 (90 BASE) MCG/ACT IN AERS
2.0000 | INHALATION_SPRAY | Freq: Four times a day (QID) | RESPIRATORY_TRACT | Status: DC | PRN
Start: 2015-09-05 — End: 2015-09-06

## 2015-09-05 MED ORDER — LEVOFLOXACIN 500 MG PO TABS: 500.0000 mg | ORAL_TABLET | Freq: Every day | ORAL | Status: DC

## 2015-09-05 NOTE — Telephone Encounter (Signed)
Informed pt to use albuterol inhaler 2 puffs every 4-6 hours and take levaquin Shane sent to pharmacy. Told him to get plenty of rest and fluids and continue to take mucinex dm OTC and if he gets worse to go to the ED

## 2015-09-05 NOTE — Progress Notes (Addendum)
Subjective: Chief Complaint  Patient presents with  . Cough    had a ct scan and they gave him the iv contrast. said he has been sick ever since. mucinex OTC and cough syrup and tylenol. has not taken any meds this morning but been taking them until today   Here with wife.  He recently had stent to repair aortic aneurysm 08/13/15, was hospitalized overnight.  He had CT with contrast to check for arterial leak on 09/02/15, and had to stop metformin while on this.  Starting back Metformin today.   He notes few day hx/o cough, SOB, but he is always SOB chronically.   Has had pneumonia in the past, hospitalized for this prior.   Denies other URI symptoms other than congestion.  Denies nausea, vomiting, diarrhea, sore throat, ear pain.  Using some mucinex DM.  Former smoker but quit in 1985.  No other aggravating or relieving factors. No other complaint.  Past Medical History  Diagnosis Date  . History of: ST elevation myocardial infarction (STEMI) involving left circumflex coronary artery with complication Q000111Q    PTCA-circumflex; PCI in 1991  . CAD, multiple vessel 1985-2010    Most recent cath 01/01/09: 100% Occluded LAD after SP1, patent LIMA-distal LAD with apical 90% lesion.  Cx-OM1 widely patent stent into proximal bifurcating OM1 . Follow-on Cx => 70-80% -- non-amenable PCI. Widely patent 3 overlapping stents in midRCA with only 40% RPL stenosis; SVG- OM known to be occluded.  . S/P CABG x 2 1997    LIMA-LAD, SVG-OM  . H/O unstable angina 06/2003, 12/2006, 05/2014    a) '04: Staged Taxus DES PCI to RCA and Cx-OM;;'08 - PCI to proximal ISR in RCA - Cypher DES; c) 05/2014: ISR in RCA & OM1, PCI with Promus P DES: RCA  2.75 x 12, OM1 3.0 mm x 8)  . History of nuclear stress test December 2013    LOW at Risk. Moderate region of mid to basal inferolateral scar without ischemia -- consistent with distal circumflex disease. Mild apical hypokinesis with an EF of 47%.  . Irregular heart beat     PVCs and  PACs, no arrhythmia recorded  . Exertional dyspnea     Chronic baseline SOB with ambulation  . Diabetes mellitus     Not on oral medication  . Hypertension   . Dyslipidemia, goal LDL below 70   . Osteoarthritis of both knees     And back; multiple back surgeries, right knee arthroplasty and left knee arthroscopic surgery x2  . Chronic back pain      multiple surgeries; C-spine and lumbar  . Erectile dysfunction   . H/O: pneumonia February 14  . Chronic anemia     On iron supplement; history of positive guaiac - negative colonoscopy in 1996.; Thought to be related to hemorrhoids; status post hemorrhoidectomy  . Ankle edema     Chronic  . Diverticulitis of colon 1996  . Choledocholithiasis   . Hemorrhoids   . Diverticulosis   . Adenomatous colon polyp   . Hiatal hernia   . Cholelithiasis - with cholangitis     status post ERCP with removal of calculi and biliary stent placement.  . Sleep apnea     pt. states he was told to return for f/u, to be fitted for Cpap, but pt. reports that he didn't follow up-no cpap used  . AAA (abdominal aortic aneurysm) ([place]) 08/30/12    Doppler 08/30/12 had a small amount of growth. It  was a 4.2 x 4.3 cm greater in size than the previous one noted at 4 x 3.8.  05-21-15 followed by CT abd-Dr. Kellie Simmering.  Marland Kitchen GERD (gastroesophageal reflux disease)    ROS as in subjective   Objective: BP 130/80 mmHg  Pulse 94  Temp(Src) 98.2 F (36.8 C) (Oral)  Wt 216 lb (97.977 kg)  SpO2 98%  General appearance: alert, no distress, WD/WN HEENT: normocephalic, sclerae anicteric, conjunctiva pink and moist, TMs pearly, nares patent, no discharge or erythema, pharynx normal, tonsils unremarkable Oral cavity: MMM, no lesions Neck: supple, no lymphadenopathy, no thyromegaly, no masses Heart: RRR, normal S1, S2, 2/6 systolic ejection murmur Lungs: CTA bilaterally, no wheezes, rhonchi, or rales Pulses: 2+ symmetric     Assessment: Encounter Diagnoses  Name Primary?   . Cough Yes  . CAP (community acquired pneumonia)   . Type 2 diabetes mellitus with complication, without long-term current use of insulin ([place])   . Abdominal aortic aneurysm (AAA) without rupture ([place])   . Chronic renal insufficiency, stage III (moderate)     Plan: Discussed concerns, symptoms.  Will send for chest xray, labs.   F/u pending studies.  Discussed case with supervising physician Dr. Tomi Bamberger.  Gia was seen today for cough.  Diagnoses and all orders for this visit:  Cough -     DG Chest 2 View; Future -     Basic metabolic panel -     CBC with Differential/Platelet  CAP (community acquired pneumonia)  Type 2 diabetes mellitus with complication, without long-term current use of insulin ([facility]) -     Basic metabolic panel -     CBC with Differential/Platelet  Abdominal aortic aneurysm (AAA) without rupture (H[facility]) -     Basic metabolic panel -     CBC with Differential/Platelet  Chronic renal insufficiency, stage III (moderate) -     Basic metabolic panel -     CBC with Differential/Platelet  Other orders -     levofloxacin (LEVAQUIN) 500 MG tablet; Take 1 tablet (500 mg total) by mouth daily. -     albuterol (PROVENTIL HFA;VENTOLIN HFA) 108 (90 BASE) MCG/ACT inhaler; Inhale 2 puffs into the lungs every 6 (six) hours as needed for wheezing or shortness of breath.

## 2015-09-06 ENCOUNTER — Other Ambulatory Visit: Payer: Self-pay | Admitting: Medical

## 2015-09-06 MED ORDER — ALBUTEROL SULFATE HFA 108 (90 BASE) MCG/ACT IN AERS: 2.0000 | INHALATION_SPRAY | Freq: Four times a day (QID) | RESPIRATORY_TRACT | Status: DC | PRN

## 2015-09-08 NOTE — Telephone Encounter (Signed)
Rx(s) sent to pharmacy electronically.  

## 2015-09-09 ENCOUNTER — Encounter: Payer: Self-pay | Admitting: Vascular Surgery

## 2015-09-09 ENCOUNTER — Ambulatory Visit (INDEPENDENT_AMBULATORY_CARE_PROVIDER_SITE_OTHER): Payer: Self-pay | Admitting: Vascular Surgery

## 2015-09-09 VITALS — BP 158/92 | HR 68 | Temp 97.1°F | Resp 16 | Ht 69.0 in | Wt 212.0 lb

## 2015-09-09 DIAGNOSIS — I714 Abdominal aortic aneurysm, without rupture, unspecified: Secondary | ICD-10-CM

## 2015-09-09 DIAGNOSIS — I2510 Atherosclerotic heart disease of native coronary artery without angina pectoris: Secondary | ICD-10-CM

## 2015-09-09 DIAGNOSIS — Z9861 Coronary angioplasty status: Secondary | ICD-10-CM

## 2015-09-09 NOTE — Progress Notes (Signed)
Subjective:     Patient ID: [name], male   DOB: 04-02-1936, 79 y.o.   MRN: [id number]  HPI this 79 year old male returns for initial follow-up regarding his endovascular stent graft repair of an abdominal aortic aneurysm which I performed about 4 weeks ago. He had an uneventful postoperative course. He has done well since his discharge but has developed some pneumonia which is being treated with antibiotics by his medical doctor. He has had no abdominal or back pain nor claudication symptoms.  Past Medical History  Diagnosis Date  . History of: ST elevation myocardial infarction (STEMI) involving left circumflex coronary artery with complication 7829    PTCA-circumflex; PCI in 1991  . CAD, multiple vessel 1985-2010    Most recent cath 01/01/09: 100% Occluded LAD after SP1, patent LIMA-distal LAD with apical 90% lesion.  Cx-OM1 widely patent stent into proximal bifurcating OM1 . Follow-on Cx => 70-80% -- non-amenable PCI. Widely patent 3 overlapping stents in midRCA with only 40% RPL stenosis; SVG- OM known to be occluded.  . S/P CABG x 2 1997    LIMA-LAD, SVG-OM  . H/O unstable angina 06/2003, 12/2006, 05/2014    a) '04: Staged Taxus DES PCI to RCA and Cx-OM;;'08 - PCI to proximal ISR in RCA - Cypher DES; c) 05/2014: ISR in RCA & OM1, PCI with Promus P DES: RCA  2.75 x 12, OM1 3.0 mm x 8)  . History of nuclear stress test December 2013    LOW at Risk. Moderate region of mid to basal inferolateral scar without ischemia -- consistent with distal circumflex disease. Mild apical hypokinesis with an EF of 47%.  . Irregular heart beat     PVCs and PACs, no arrhythmia recorded  . Exertional dyspnea     Chronic baseline SOB with ambulation  . Diabetes mellitus     Not on oral medication  . Hypertension   . Dyslipidemia, goal LDL below 70   . Osteoarthritis of both knees     And back; multiple back surgeries, right knee arthroplasty and left knee arthroscopic surgery x2  . Chronic back pain     multiple surgeries; C-spine and lumbar  . Erectile dysfunction   . H/O: pneumonia February 14  . Chronic anemia     On iron supplement; history of positive guaiac - negative colonoscopy in 1996.; Thought to be related to hemorrhoids; status post hemorrhoidectomy  . Ankle edema     Chronic  . Diverticulitis of colon 1996  . Choledocholithiasis   . Hemorrhoids   . Diverticulosis   . Adenomatous colon polyp   . Hiatal hernia   . Cholelithiasis - with cholangitis     status post ERCP with removal of calculi and biliary stent placement.  . Sleep apnea     pt. states he was told to return for f/u, to be fitted for Cpap, but pt. reports that he didn't follow up-no cpap used  . AAA (abdominal aortic aneurysm) ([place]) 08/30/12    Doppler 08/30/12 had a small amount of growth. It was a 4.2 x 4.3 cm greater in size than the previous one noted at 4 x 3.8.  05-21-15 followed by CT abd-Dr. Kellie Simmering.  Marland Kitchen GERD (gastroesophageal reflux disease)     Social History  Substance Use Topics  . Smoking status: Former Smoker    Types: Cigarettes    Quit date: 10/05/1983  . Smokeless tobacco: Never Used  . Alcohol Use: 1.8 oz/week    3 Cans of beer  per week     Comment: 3 or more cans of beer per day - 05/21/15    Family History  Problem Relation Age of Onset  . Arthritis Mother   . Diabetes Father   . Heart disease Father   . Stroke Sister   . Hypertension Sister   . Heart disease Sister   . Diabetes Sister   . Breast cancer Sister   . Heart disease Brother   . Ulcers Brother   . Colon cancer Neg Hx     Allergies  Allergen Reactions  . Lisinopril Cough    Rxn: unknown  . Statins Other (See Comments)    Muscle ache  . Welchol [Colesevelam Hcl] Itching     Current outpatient prescriptions:  .  acetaminophen (TYLENOL) 500 MG tablet, Take 1,000 mg by mouth every 6 (six) hours as needed for mild pain, moderate pain or headache., Disp: , Rfl:  .  albuterol (PROVENTIL HFA;VENTOLIN HFA) 108 (90  BASE) MCG/ACT inhaler, Inhale 2 puffs into the lungs every 6 (six) hours as needed for wheezing or shortness of breath., Disp: 1 Inhaler, Rfl: 0 .  amLODipine (NORVASC) 5 MG tablet, TAKE 5 MG BY MOUTH DAILY, Disp: 90 tablet, Rfl: 3 .  aspirin 81 MG tablet, Take 81 mg by mouth daily., Disp: , Rfl:  .  Choline Fenofibrate (FENOFIBRIC ACID) 135 MG CPDR, TAKE ONE CAPSULE BY MOUTH ONCE DAILY (Patient taking differently: TAKE 135 MG BY MOUTH DAILY), Disp: 90 capsule, Rfl: 3 .  clopidogrel (PLAVIX) 75 MG tablet, TAKE ONE TABLET BY MOUTH ONCE DAILY (Patient taking differently: TAKE 75 MG BY MOUTH ONCE DAILY), Disp: 90 tablet, Rfl: 3 .  ferrous sulfate 325 (65 FE) MG tablet, Take 325 mg by mouth daily with breakfast., Disp: , Rfl:  .  levofloxacin (LEVAQUIN) 500 MG tablet, Take 1 tablet (500 mg total) by mouth daily., Disp: 7 tablet, Rfl: 0 .  Multiple Vitamins-Minerals (MULTIVITAMIN WITH MINERALS) tablet, Take 1 tablet by mouth daily. , Disp: , Rfl:  .  oxyCODONE (ROXICODONE) 5 MG immediate release tablet, Take 1 tablet (5 mg total) by mouth every 6 (six) hours as needed., Disp: 20 tablet, Rfl: 0 .  pantoprazole (PROTONIX) 40 MG tablet, TAKE ONE TABLET BY MOUTH ONCE DAILY (Patient taking differently: TAKE 40 MG BY MOUTH DAILY), Disp: 90 tablet, Rfl: 3 .  pioglitazone-metformin (ACTOPLUS MET) 15-500 MG tablet, TAKE ONE TABLET BY MOUTH ONCE DAILY, Disp: 90 tablet, Rfl: 0 .  sildenafil (REVATIO) 20 MG tablet, Take 2-5 pills daily as needed (Patient taking differently: Take 40-100 mg by mouth daily as needed (erectile dysfunction). Take 2-5 pills daily as needed), Disp: 50 tablet, Rfl: 5 .  ZETIA 10 MG tablet, TAKE ONE TABLET BY MOUTH ONCE DAILY, Disp: 90 tablet, Rfl: 2 .  losartan (COZAAR) 50 MG tablet, TAKE ONE TABLET BY MOUTH ONCE DAILY (Patient not taking: Reported on 09/09/2015), Disp: 90 tablet, Rfl: 0  Filed Vitals:   09/09/15 1525 09/09/15 1530  BP: 168/87 158/92  Pulse: 68 68  Temp: 97.1 F (36.2 C)    Resp: 16   Height: '5\' 9"'  (1.753 m)   Weight: 212 lb (96.163 kg)   SpO2: 99%     Body mass index is 31.29 kg/(m^2).           Review of Systems denies chest pain but does have some congestion and productive cough with wheezing.    Objective:   Physical Exam BP 158/92 mmHg  Pulse 68  Temp(Src) 97.1 F (36.2 C)  Resp 16  Ht '5\' 9"'  (1.753 m)  Wt 212 lb (96.163 kg)  BMI 31.29 kg/m2  SpO2 99%  Gen.-alert and oriented x3 in no apparent distress HEENT normal for age Lungs-bilateral congestion and wheezing Cardiovascular regular rhythm no murmurs carotid pulses 3+ palpable no bruits audible Abdomen soft nontender no palpable masses Musculoskeletal free of  major deformities Skin clear -no rashes Neurologic normal Lower extremities 3+ femoral and dorsalis pedis pulses palpable bilaterally with no edema  Today I reviewed the CT angiogram of the abdomen and pelvis by computer. I reviewed the images and reviewed the report of the radiologist. Patient does have a type II endoleak which appears to originate from the inferior mesenteric artery and drains into the L4 lumbar artery. There is also some very mild relative ischemia of the midportion of the right kidney in the lower pole of the left kidney.      Assessment:     4 months post endovascular stent graft repair of abdominal aortic aneurysm with type II endoleak but stable aneurysm sac size-4.0 x 4.6 cm Mild relative ischemia midportion right kidney and lower portion left kidney. Possible pneumonia being treated by his medical doctor    Plan:     Return in 6 months with repeat CT angiogram of abdomen and pelvis to follow-up on above items Discuss this with patient and his wife.

## 2015-09-09 NOTE — Addendum Note (Signed)
Addended by: Dorthula Rue L on: 09/09/2015 04:55 PM   Modules accepted: Orders

## 2015-09-09 NOTE — Progress Notes (Signed)
Filed Vitals:   09/09/15 1525 09/09/15 1530  BP: 168/87 158/92  Pulse: 68 68  Temp: 97.1 F (36.2 C)   Resp: 16   Height: 5\' 9"  (1.753 m)   Weight: 212 lb (96.163 kg)   SpO2: 99%

## 2015-09-12 ENCOUNTER — Encounter: Payer: Self-pay | Admitting: Medical

## 2015-09-12 ENCOUNTER — Ambulatory Visit (INDEPENDENT_AMBULATORY_CARE_PROVIDER_SITE_OTHER): Payer: Medicare Other | Admitting: Medical

## 2015-09-12 VITALS — BP 150/80 | HR 63 | Temp 97.5°F | Wt 210.0 lb

## 2015-09-12 DIAGNOSIS — Z9861 Coronary angioplasty status: Secondary | ICD-10-CM | POA: Diagnosis not present

## 2015-09-12 DIAGNOSIS — I2510 Atherosclerotic heart disease of native coronary artery without angina pectoris: Secondary | ICD-10-CM

## 2015-09-12 DIAGNOSIS — R059 Cough, unspecified: Secondary | ICD-10-CM

## 2015-09-12 DIAGNOSIS — R05 Cough: Secondary | ICD-10-CM | POA: Diagnosis not present

## 2015-09-12 DIAGNOSIS — J189 Pneumonia, unspecified organism: Secondary | ICD-10-CM | POA: Diagnosis not present

## 2015-09-12 DIAGNOSIS — N289 Disorder of kidney and ureter, unspecified: Secondary | ICD-10-CM

## 2015-09-12 LAB — BASIC METABOLIC PANEL
BUN: 25 mg/dL (ref 7–25)
CALCIUM: 9.5 mg/dL (ref 8.6–10.3)
CO2: 24 mmol/L (ref 20–31)
Chloride: 108 mmol/L (ref 98–110)
Creat: 1.71 mg/dL — ABNORMAL HIGH (ref 0.70–1.18)
Glucose, Bld: 94 mg/dL (ref 65–99)
POTASSIUM: 4.4 mmol/L (ref 3.5–5.3)
SODIUM: 135 mmol/L (ref 135–146)

## 2015-09-12 NOTE — Progress Notes (Signed)
Subjective: Chief Complaint  Patient presents with  . Follow-up    feeling better coughing flim still. finished antibiotic last night. using inhaler. feels like it is all helping   Here for f/u.  I saw him last week on 09/05/15 for respiratory tract infection.  Had him begin Levaquin, and had him hold off on his ARB over the weekend given elevated creatinine.   Prior to last visit he recently had stent to repair aortic aneurysm 08/13/15, was hospitalized overnight.  He had CT with contrast to check for arterial leak on 09/02/15, and had to stop metformin while on this. His symptoms last visit included cough, SOB, but he is always SOB chronically.      Currently he reports some residual cough but overall much improved.   Finished antibiotic yesterday.   drinking plenty of water.  No fever.     No other aggravating or relieving factors. No other complaint.  Past Medical History  Diagnosis Date  . History of: ST elevation myocardial infarction (STEMI) involving left circumflex coronary artery with complication [id number]    PTCA-circumflex; PCI in 1991  . CAD, multiple vessel 1985-2010    Most recent cath 01/01/09: 100% Occluded LAD after SP1, patent LIMA-distal LAD with apical 90% lesion.  Cx-OM1 widely patent stent into proximal bifurcating OM1 . Follow-on Cx => 70-80% -- non-amenable PCI. Widely patent 3 overlapping stents in midRCA with only 40% RPL stenosis; SVG- OM known to be occluded.  . S/P CABG x 2 1997    LIMA-LAD, SVG-OM  . H/O unstable angina 06/2003, 12/2006, 05/2014    a) '04: Staged Taxus DES PCI to RCA and Cx-OM;;'08 - PCI to proximal ISR in RCA - Cypher DES; c) 05/2014: ISR in RCA & OM1, PCI with Promus P DES: RCA  2.75 x 12, OM1 3.0 mm x 8)  . History of nuclear stress test December 2013    LOW at Risk. Moderate region of mid to basal inferolateral scar without ischemia -- consistent with distal circumflex disease. Mild apical hypokinesis with an EF of 47%.  . Irregular heart beat    PVCs and PACs, no arrhythmia recorded  . Exertional dyspnea     Chronic baseline SOB with ambulation  . Diabetes mellitus     Not on oral medication  . Hypertension   . Dyslipidemia, goal LDL below 70   . Osteoarthritis of both knees     And back; multiple back surgeries, right knee arthroplasty and left knee arthroscopic surgery x2  . Chronic back pain      multiple surgeries; C-spine and lumbar  . Erectile dysfunction   . H/O: pneumonia February 14  . Chronic anemia     On iron supplement; history of positive guaiac - negative colonoscopy in 1996.; Thought to be related to hemorrhoids; status post hemorrhoidectomy  . Ankle edema     Chronic  . Diverticulitis of colon 1996  . Choledocholithiasis   . Hemorrhoids   . Diverticulosis   . Adenomatous colon polyp   . Hiatal hernia   . Cholelithiasis - with cholangitis     status post ERCP with removal of calculi and biliary stent placement.  . Sleep apnea     pt. states he was told to return for f/u, to be fitted for Cpap, but pt. reports that he didn't follow up-no cpap used  . AAA (abdominal aortic aneurysm) ([place]) 08/30/12    Doppler 08/30/12 had a small amount of growth. It was a 4.2 x  4.3 cm greater in size than the previous one noted at 4 x 3.8.  05-21-15 followed by CT abd-Dr. Kellie Simmering.  Marland Kitchen GERD (gastroesophageal reflux disease)    ROS as in subjective   Objective: BP 150/80 mmHg  Pulse 63  Temp(Src) 97.5 F (36.4 C) (Oral)  Wt 210 lb (95.255 kg)  BP Readings from Last 3 Encounters:  09/12/15 150/80  09/09/15 158/92  09/05/15 130/80   Wt Readings from Last 3 Encounters:  09/12/15 210 lb (95.255 kg)  09/09/15 212 lb (96.163 kg)  09/05/15 216 lb (97.977 kg)   General appearance: alert, no distress, WD/WN HEENT: normocephalic, sclerae anicteric, conjunctiva pink and moist, TMs pearly, nares patent, no discharge or erythema, pharynx normal, tonsils unremarkable Oral cavity: MMM, no lesions Neck: supple, no  lymphadenopathy, no thyromegaly, no masses Heart: RRR, normal S1, S2, 2/6 systolic ejection murmur Lungs: CTA bilaterally, no wheezes, rhonchi, or rales Pulses: 2+ symmetric     Assessment: Encounter Diagnoses  Name Primary?  . Pneumonia due to organism Yes  . Renal insufficiency   . Cough     Plan: Discussed concerns, symptoms.  Improved.  Has finished Levaquin.  Repeat BMET.  Pending BMET, will likely restart Losartan or other modification for BP.   C/t rest, hydration, albuterol q6 hours, mucinex prn.    Fennec was seen today for follow-up.  Diagnoses and all orders for this visit:  Pneumonia due to organism  Renal insufficiency -     Basic metabolic panel  Cough

## 2015-09-18 ENCOUNTER — Ambulatory Visit: Payer: Medicare Other | Admitting: Cardiology

## 2015-09-24 DIAGNOSIS — H26491 Other secondary cataract, right eye: Secondary | ICD-10-CM | POA: Diagnosis not present

## 2015-09-24 DIAGNOSIS — Z961 Presence of intraocular lens: Secondary | ICD-10-CM | POA: Diagnosis not present

## 2015-09-24 DIAGNOSIS — H26492 Other secondary cataract, left eye: Secondary | ICD-10-CM | POA: Diagnosis not present

## 2015-09-30 ENCOUNTER — Encounter: Payer: Self-pay | Admitting: Family Medicine

## 2015-09-30 ENCOUNTER — Ambulatory Visit (INDEPENDENT_AMBULATORY_CARE_PROVIDER_SITE_OTHER): Payer: Medicare Other | Admitting: Family Medicine

## 2015-09-30 VITALS — BP 128/80 | HR 68 | Wt 213.8 lb

## 2015-09-30 DIAGNOSIS — T466X5A Adverse effect of antihyperlipidemic and antiarteriosclerotic drugs, initial encounter: Secondary | ICD-10-CM | POA: Insufficient documentation

## 2015-09-30 DIAGNOSIS — Z889 Allergy status to unspecified drugs, medicaments and biological substances status: Secondary | ICD-10-CM | POA: Diagnosis not present

## 2015-09-30 DIAGNOSIS — Z79899 Other long term (current) drug therapy: Secondary | ICD-10-CM

## 2015-09-30 DIAGNOSIS — N529 Male erectile dysfunction, unspecified: Secondary | ICD-10-CM

## 2015-09-30 DIAGNOSIS — I10 Essential (primary) hypertension: Secondary | ICD-10-CM

## 2015-09-30 DIAGNOSIS — E1159 Type 2 diabetes mellitus with other circulatory complications: Secondary | ICD-10-CM | POA: Diagnosis not present

## 2015-09-30 DIAGNOSIS — N528 Other male erectile dysfunction: Secondary | ICD-10-CM

## 2015-09-30 DIAGNOSIS — Z789 Other specified health status: Secondary | ICD-10-CM | POA: Insufficient documentation

## 2015-09-30 DIAGNOSIS — G720 Drug-induced myopathy: Secondary | ICD-10-CM | POA: Insufficient documentation

## 2015-09-30 DIAGNOSIS — Z9861 Coronary angioplasty status: Secondary | ICD-10-CM

## 2015-09-30 DIAGNOSIS — E118 Type 2 diabetes mellitus with unspecified complications: Secondary | ICD-10-CM | POA: Diagnosis not present

## 2015-09-30 DIAGNOSIS — I2510 Atherosclerotic heart disease of native coronary artery without angina pectoris: Secondary | ICD-10-CM

## 2015-09-30 DIAGNOSIS — Z951 Presence of aortocoronary bypass graft: Secondary | ICD-10-CM | POA: Diagnosis not present

## 2015-09-30 DIAGNOSIS — E669 Obesity, unspecified: Secondary | ICD-10-CM | POA: Diagnosis not present

## 2015-09-30 DIAGNOSIS — I152 Hypertension secondary to endocrine disorders: Secondary | ICD-10-CM

## 2015-09-30 HISTORY — DX: Drug-induced myopathy: G72.0

## 2015-09-30 HISTORY — DX: Adverse effect of antihyperlipidemic and antiarteriosclerotic drugs, initial encounter: T46.6X5A

## 2015-09-30 NOTE — Progress Notes (Signed)
  Subjective:    Patient ID: [name], male    DOB: 04/07/1936, 79 y.o.   MRN: [id number]  [name] is a 79 y.o. male who presents for follow-up of Type 2 diabetes mellitus.  Patient is checking home blood sugars.   Home blood sugar records: BGs range between 100 and 125 How often is blood sugars being checked: every day Current symptoms/problems include none and have been stable. Daily foot checks: yes   Any foot concerns: none Last eye exam: last week  Exercise: not much due to back and knee He reports that he has stopped taking his Zetia due to costs. He has had no chest pain, shortness of breath. He would like samples of Viagra he has been seen recently by cardiovascular as well as cardiology and is doing well. He has had multiple cardiac stents and also had a AAA stent and is doing well with that. All portions of the patient's history were reviewed and updated as appropriate: allergies, current medications, past medical history, past social history and problem list.  ROS as in subjective above.     Objective:    Physical Exam Alert and in no distress otherwise not examined.  Weight 213 lb 12.8 oz (96.979 kg).  Lab Review Diabetic Labs Latest Ref Rng 09/12/2015 09/05/2015 08/14/2015 08/13/2015 08/05/2015  HbA1c 4.8 - 5.6 % - - - - 5.4  Chol 0 - 200 mg/dL - - - - -  HDL >=40 mg/dL - - - - -  Calc LDL 0 - 99 mg/dL - - - - -  Triglycerides <150 mg/dL - - - - -  Creatinine 0.70 - 1.18 mg/dL 1.71(H) 1.64(H) 1.43(H) 1.28(H) 1.33(H)  GFR >60.00 mL/min - - - - -   BP/Weight 09/30/2015 09/12/2015 09/09/2015 09/05/2015 [id number]  Systolic BP - Q000111Q [id number]  Diastolic BP - 80 92 80 58  Wt. (Lbs) 213.8 210 212 216 -  BMI 31.56 31 31.29 31.88 -   Foot/eye exam completion dates Latest Ref Rng 02/14/2014 08/17/2013  Eye Exam No Retinopathy No Retinopathy -  Foot Form Completion - - Done   recent A1c was 5.4  [name]  reports that he quit smoking about 32 years ago. His  smoking use included Cigarettes. He has never used smokeless tobacco. He reports that he drinks about 1.8 oz of alcohol per week. He reports that he does not use illicit drugs.     Assessment & Plan:    Encounter for long-term (current) use of medications  ED (erectile dysfunction) of organic origin  Type 2 diabetes mellitus with complication, without long-term current use of insulin ([place])  Hypertension associated with diabetes ([place])  S/P CABG x 2: 1997. SVG-OM (known to be occluded), LIMA-LAD  Statin intolerance  CAD S/P CABG '95- several PCis since, last 05/17/14  Obesity (BMI 30-39.9)   1. Rx changes: Repatha prior authorization  2. Education: Reviewed 'ABCs' of diabetes management (respective goals in parentheses):  A1C (<7), blood pressure (<130/80), and cholesterol (LDL <100). 3. Compliance at present is estimated to be good. Efforts to improve compliance (if necessary) will be directed at increased exercise. 4. Follow up: 4 months  A sample of Viagra was given.

## 2015-10-01 ENCOUNTER — Telehealth: Payer: Self-pay | Admitting: Family Medicine

## 2015-10-01 NOTE — Telephone Encounter (Signed)
Left message for pt to call. I needs pt to bring by his PHARMACY benefits card. We need a copy of front and back for a rx going to a specialty pharmacy.

## 2015-10-03 ENCOUNTER — Telehealth: Payer: Self-pay | Admitting: Family Medicine

## 2015-10-03 NOTE — Telephone Encounter (Signed)
Faxed paperwork to [facility] for [place]

## 2015-10-07 ENCOUNTER — Other Ambulatory Visit: Payer: Self-pay | Admitting: *Deleted

## 2015-10-07 DIAGNOSIS — I5020 Unspecified systolic (congestive) heart failure: Secondary | ICD-10-CM

## 2015-10-07 NOTE — Patient Outreach (Signed)
[facility]) Care Management  10/07/2015  [name] [birth year] [id number]  RN Health Coach telephone call to patient.  [employer] compliance verified. Patient was unavailable. RN Health Coach spoke with wife [name]. Per [name] request for Information to be  mailed to the patient home on 08/08/2015..  The patient was having surgery a Triple A on 08/13/2015.  Per wife [name] at that time stated she would call me back afterwards and discuss further. No call was received. Per call to wife today to follow up. The patient is doing well. He was on Zetia and had gotten in a donut hole and he couldn't afford the medication. Per wife the Dr is putting the patient on [place]. They are trying to get it approved by the insurance.  The wife has heard of the zones of CHF. She was unable to describe the zones to me or the action plans. The patient does weigh twice daily. The patient is also a diabetic and his blood sugars are running 101-106. Patient wife has agreed to a referral to the pharmacy to see if anything could be done about the cost of the medication. Per wife ok for a referral to a Health Coach. Assessment: Patient is unable to afford medication Zetia Physician is trying to get Repatha approved Patient and wife need education on CHF Patient could benefit from referral to pharmacy Patient could benefit with referral to [place]  Referral to Health Coach  Refer to Pharmacist  [place] Management [phone]

## 2015-10-08 DIAGNOSIS — H26491 Other secondary cataract, right eye: Secondary | ICD-10-CM | POA: Diagnosis not present

## 2015-10-09 NOTE — Progress Notes (Signed)
This encounter was created in error - please disregard.

## 2015-10-10 NOTE — Telephone Encounter (Signed)
Recv'd denial from ins company.  Faxed denial to [facility]

## 2015-10-13 ENCOUNTER — Other Ambulatory Visit: Payer: Self-pay | Admitting: Pharmacist

## 2015-10-13 NOTE — Patient Outreach (Signed)
[facility]) Care Management  [place]   10/13/2015  [name] 1936/04/13 [id number]  Subjective: [name] is a 80 y.o. male who was referred to [place] for a medication review and medication assistance for Repatha.   Objective:   Current Medications: Current Outpatient Prescriptions  Medication Sig Dispense Refill  . acetaminophen (TYLENOL) 500 MG tablet Take 1,000 mg by mouth every 6 (six) hours as needed for mild pain, moderate pain or headache.    . albuterol (PROVENTIL HFA;VENTOLIN HFA) 108 (90 BASE) MCG/ACT inhaler Inhale 2 puffs into the lungs every 6 (six) hours as needed for wheezing or shortness of breath. 1 Inhaler 0  . amLODipine (NORVASC) 5 MG tablet TAKE 5 MG BY MOUTH DAILY 90 tablet 3  . aspirin 81 MG tablet Take 81 mg by mouth daily.    . Choline Fenofibrate (FENOFIBRIC ACID) 135 MG CPDR TAKE ONE CAPSULE BY MOUTH ONCE DAILY (Patient taking differently: TAKE 135 MG BY MOUTH DAILY) 90 capsule 3  . clopidogrel (PLAVIX) 75 MG tablet TAKE ONE TABLET BY MOUTH ONCE DAILY (Patient taking differently: TAKE 75 MG BY MOUTH ONCE DAILY) 90 tablet 3  . ferrous sulfate 325 (65 FE) MG tablet Take 325 mg by mouth daily with breakfast.    . losartan (COZAAR) 50 MG tablet TAKE ONE TABLET BY MOUTH ONCE DAILY 90 tablet 0  . Multiple Vitamins-Minerals (MULTIVITAMIN WITH MINERALS) tablet Take 1 tablet by mouth daily.     Marland Kitchen oxyCODONE (ROXICODONE) 5 MG immediate release tablet Take 1 tablet (5 mg total) by mouth every 6 (six) hours as needed. (Patient not taking: Reported on 09/12/2015) 20 tablet 0  . pantoprazole (PROTONIX) 40 MG tablet TAKE ONE TABLET BY MOUTH ONCE DAILY (Patient taking differently: TAKE 40 MG BY MOUTH DAILY) 90 tablet 3  . pioglitazone-metformin (ACTOPLUS MET) 15-500 MG tablet TAKE ONE TABLET BY MOUTH ONCE DAILY 90 tablet 0  . sildenafil (REVATIO) 20 MG tablet Take 2-5 pills daily as needed 50 tablet 5   No current  facility-administered medications for this visit.    Functional Status: In your present state of health, do you have any difficulty performing the following activities: 08/13/2015 08/05/2015  Hearing? N N  Vision? N N  Difficulty concentrating or making decisions? N N  Walking or climbing stairs? Y Y  Dressing or bathing? N N  Doing errands, shopping? N -    Fall/Depression Screening: PHQ 2/9 Scores 10/07/2015 10/08/2014 08/17/2013 07/26/2012  PHQ - 2 Score 0 0 0 4  PHQ- 9 Score - - - 8    Assessment: Drugs sorted by system:  Neurologic/Psychologic: none  Cardiovascular: amlodipine, aspirin, choline fenofibrate, clopidogrel, losartan  Pulmonary/Allergy: albuterol  Gastrointestinal: pantoprazole  Endocrine: pioglitazone-metformin  Renal: none  Infectious Diseases: none  Topical: none  Pain: acetaminophen  Vitamins/Minerals: ferrous sulfate, MVI  Miscellaneous: sildenafil  Findings:  Duplications in therapy: none noted  Gaps in therapy: patient has statin intolerance and is to be started on Repatha.  Medications to avoid in the elderly: none noted  Drug interactions: none noted  Inappropriate dose: none noted  Other issues noted: none - need to monitor renal function closely for metformin use but currently appropriate based on last SCr in December 2016.   Plan: 1. Medication review: no issues noted and no recommendations for any changes 2. Medication assistance: patient is undergoing prior authorization with insurance at this physician's office. I called the patient to discuss this and I had  to leave a HIPAA compliant message for patient to return my phone call.  I will reach out on 10/17/15 if the patient does not return my call today.  Nicoletta Ba, PharmD, [facility] [phone]

## 2015-10-14 ENCOUNTER — Other Ambulatory Visit: Payer: Self-pay | Admitting: Family Medicine

## 2015-10-14 NOTE — Telephone Encounter (Signed)
Faxed appeal letter to Medicare Part D appeals t# [phone]

## 2015-10-17 ENCOUNTER — Other Ambulatory Visit: Payer: Self-pay | Admitting: Pharmacist

## 2015-10-17 NOTE — Patient Outreach (Signed)
[name]) Care Management  [name]   10/17/2015  [name] September 11, 1936 [id number]  Subjective: [name] is a 80 y.o. male who was referred to [place] for a medication review and medication assistance for Repatha.   I have already completed the medication review (see note from 10/13/15).  I spoke to the patient and he said that his physician's office is working on the application for assistance for [place].   The PAN program that provides assistance is currently closed.   I have no other resources to offer the patient to help cover the medication.  Objective:   Current Medications: Current Outpatient Prescriptions  Medication Sig Dispense Refill  . acetaminophen (TYLENOL) 500 MG tablet Take 1,000 mg by mouth every 6 (six) hours as needed for mild pain, moderate pain or headache.    . albuterol (PROVENTIL HFA;VENTOLIN HFA) 108 (90 BASE) MCG/ACT inhaler Inhale 2 puffs into the lungs every 6 (six) hours as needed for wheezing or shortness of breath. 1 Inhaler 0  . amLODipine (NORVASC) 5 MG tablet TAKE 5 MG BY MOUTH DAILY 90 tablet 3  . aspirin 81 MG tablet Take 81 mg by mouth daily.    . Choline Fenofibrate (FENOFIBRIC ACID) 135 MG CPDR TAKE ONE CAPSULE BY MOUTH ONCE DAILY (Patient taking differently: TAKE 135 MG BY MOUTH DAILY) 90 capsule 3  . clopidogrel (PLAVIX) 75 MG tablet TAKE ONE TABLET BY MOUTH ONCE DAILY (Patient taking differently: TAKE 75 MG BY MOUTH ONCE DAILY) 90 tablet 3  . ferrous sulfate 325 (65 FE) MG tablet Take 325 mg by mouth daily with breakfast.    . losartan (COZAAR) 50 MG tablet TAKE ONE TABLET BY MOUTH ONCE DAILY 90 tablet 0  . Multiple Vitamins-Minerals (MULTIVITAMIN WITH MINERALS) tablet Take 1 tablet by mouth daily.     Marland Kitchen oxyCODONE (ROXICODONE) 5 MG immediate release tablet Take 1 tablet (5 mg total) by mouth every 6 (six) hours as needed. (Patient not taking: Reported on 09/12/2015) 20 tablet 0  . pantoprazole  (PROTONIX) 40 MG tablet TAKE ONE TABLET BY MOUTH ONCE DAILY (Patient taking differently: TAKE 40 MG BY MOUTH DAILY) 90 tablet 3  . pioglitazone-metformin (ACTOPLUS MET) 15-500 MG tablet TAKE ONE TABLET BY MOUTH ONCE DAILY 90 tablet 0  . sildenafil (REVATIO) 20 MG tablet Take 2-5 pills daily as needed 50 tablet 5   No current facility-administered medications for this visit.    Functional Status: In your present state of health, do you have any difficulty performing the following activities: 08/13/2015 08/05/2015  Hearing? N N  Vision? N N  Difficulty concentrating or making decisions? N N  Walking or climbing stairs? Y Y  Dressing or bathing? N N  Doing errands, shopping? N -    Fall/Depression Screening: PHQ 2/9 Scores 10/07/2015 10/08/2014 08/17/2013 07/26/2012  PHQ - 2 Score 0 0 0 4  PHQ- 9 Score - - - 8    Assessment: 1. Medication assistance: patient with Medicare Part D and he cannot afford Repatha. The doctor's office is working on the patient assistance program application but it is unlikely that he will be accepted as he has Medicare. The PAN program is currently closed so there is no financial assistance available from them right now.    Plan: 1. Medication assistance: patient is undergoing prior authorization with insurance at this physician's office. There are no other resources that I can provide to the patient. Patient verbalized understanding. Will close pharmacy  case.   [name], PharmD, [place] [phone]

## 2015-10-17 NOTE — Telephone Encounter (Signed)
[name] called for more info regarding appeal.  States Repatha is approved is written by Cardiologist, Lipidologist, Endocrinologist.  Advised pt sees a Cardiologist but Dr. Redmond School prescribes his maintanance meds.  She will resubmit with info and we should recv response today.

## 2015-10-22 ENCOUNTER — Other Ambulatory Visit: Payer: Self-pay

## 2015-10-22 NOTE — Patient Outreach (Signed)
[facility] [facility]) Care Management  10/22/2015  [name] 1935-10-21 [id number]   Telephone call to patient referred for disease management of CHF.  Call was answered by his wife, who stated that patient was not at home.  She requested I call back at another time.  Appointment rescheduled for 10-28-15.  Candie Mile, RN, MSN [place] [phone] Fax [phone]

## 2015-10-23 NOTE — Telephone Encounter (Signed)
Repatha approved til 04/04/16, faxed approval to [employer] called [employer] s/w Sonia Baller she states it went thru but co pay is $406.  She is going to work with pt and try to get Patient some assistance.  She will let me know if that doesn't work so we can switch him if necessary.

## 2015-10-28 ENCOUNTER — Ambulatory Visit: Payer: Self-pay

## 2015-10-28 ENCOUNTER — Other Ambulatory Visit: Payer: Self-pay

## 2015-10-28 NOTE — Patient Outreach (Signed)
[facility]) Care Management  10/28/2015  [name] 01-Jan-1936 [id number]   Telephone call to patient for initial assessment.  Patient reports he has another appointment for today, and requested that I reschedule our contact for 10-30-15.  Appointment set up for 10-30-15 at 9AM.  Candie Mile, RN, MSN [place] [phone] Fax [phone]

## 2015-10-30 ENCOUNTER — Other Ambulatory Visit: Payer: Self-pay

## 2015-10-30 NOTE — Patient Outreach (Signed)
[place] [facility]) Care Management  10/30/2015  [name] 1935/10/24 [id number]  3rd unsuccessful attempt to reach patient for scheduled initial assessment.  HIPPA appropriate message left requesting call back. RN will attempt contact later today.  Candie Mile, RN, MSN [place] [phone] Fax [phone]

## 2015-11-04 NOTE — Patient Outreach (Signed)
[place] [facility]) Care Management  October 30, 2015  [name] Feb 13, 1936 [id number]   Another unsuccessful  attempt to reach patient this afternoon. RN will make another attempt within one week.  Candie Mile, RN, MSN [place] [phone] Fax [phone]

## 2015-11-11 ENCOUNTER — Other Ambulatory Visit: Payer: Self-pay

## 2015-11-11 DIAGNOSIS — H1132 Conjunctival hemorrhage, left eye: Secondary | ICD-10-CM | POA: Diagnosis not present

## 2015-11-11 NOTE — Patient Outreach (Signed)
[place] [facility]) Care Management  11/11/2015  [name] 1936/03/19 [id number]   Reached patient today after 3 previous unsuccessful attempts.  He was referred for disease management for congestive health failure education and management.  Briefly reviewed [facility] services.  Patient stated he is in frequent contact with a nurse from [facility], and that he does not need or want [facility] services.  Reports his wife assists him in managing health issues.  Patient did agree for me to send a pamphlet to him about [facility].  Encouraged him to call me with any questions or concerns.  Plan:  Mail letter with pamphlet and magnet.           Notify PCP of case closure.  Candie Mile, RN, MSN [place] [phone] Fax [phone]

## 2015-11-15 ENCOUNTER — Other Ambulatory Visit: Payer: Self-pay | Admitting: Family Medicine

## 2015-11-19 ENCOUNTER — Other Ambulatory Visit: Payer: Self-pay | Admitting: Family Medicine

## 2015-11-25 NOTE — Telephone Encounter (Signed)
Called pt and advised, approved but co pay $406 & they can't afford, info given to pt & his wife to call Delsa Sale @ [place] t# [phone] for info on the Pt Assistance programs for this medication.

## 2015-12-01 ENCOUNTER — Telehealth: Payer: Self-pay | Admitting: Family Medicine

## 2015-12-01 NOTE — Telephone Encounter (Signed)
Pt dropped off disability parking placard to be completed. Please call pt at home when complete.

## 2015-12-01 NOTE — Telephone Encounter (Signed)
Pt called and told form is ready. Per pt mailing to [place] Address.

## 2015-12-02 DIAGNOSIS — M1712 Unilateral primary osteoarthritis, left knee: Secondary | ICD-10-CM | POA: Diagnosis not present

## 2015-12-02 DIAGNOSIS — M25551 Pain in right hip: Secondary | ICD-10-CM | POA: Diagnosis not present

## 2015-12-15 ENCOUNTER — Other Ambulatory Visit: Payer: Self-pay | Admitting: Family Medicine

## 2015-12-17 ENCOUNTER — Telehealth: Payer: Self-pay | Admitting: Family Medicine

## 2015-12-17 MED ORDER — GLUCOSE BLOOD VI STRP: ORAL_STRIP | Status: DC

## 2015-12-17 NOTE — Telephone Encounter (Signed)
Pt's wife called for refills of Verio one touch test strips. She states he is completely out. Please send to [facility] on [address]. Pt can be reached at [phone]

## 2015-12-17 NOTE — Telephone Encounter (Signed)
done

## 2015-12-18 ENCOUNTER — Telehealth: Payer: Self-pay | Admitting: Family Medicine

## 2015-12-18 MED ORDER — GLUCOSE BLOOD VI STRP
ORAL_STRIP | Status: DC
Start: 2015-12-18 — End: 2017-04-20

## 2015-12-18 NOTE — Telephone Encounter (Signed)
Pt is at the pharmacy waiting and still unable to get test strips due to missing info on Rx. Resent Rx for test strips with diagnosis code and # of times a day pt tests

## 2015-12-30 NOTE — Telephone Encounter (Signed)
Called & spoke with pt & his wife and explained how Pt Assistance worked because they didn't understand the process.  Wife will call Pt Assistance today and see if pt can get approved.

## 2016-01-15 ENCOUNTER — Other Ambulatory Visit: Payer: Self-pay | Admitting: Family Medicine

## 2016-02-02 ENCOUNTER — Ambulatory Visit (INDEPENDENT_AMBULATORY_CARE_PROVIDER_SITE_OTHER): Payer: Medicare Other | Admitting: Cardiology

## 2016-02-02 ENCOUNTER — Encounter: Payer: Self-pay | Admitting: Cardiology

## 2016-02-02 VITALS — BP 150/80 | HR 62 | Ht 69.0 in | Wt 214.4 lb

## 2016-02-02 DIAGNOSIS — T82857D Stenosis of cardiac prosthetic devices, implants and grafts, subsequent encounter: Secondary | ICD-10-CM

## 2016-02-02 DIAGNOSIS — I5032 Chronic diastolic (congestive) heart failure: Secondary | ICD-10-CM | POA: Diagnosis not present

## 2016-02-02 DIAGNOSIS — I350 Nonrheumatic aortic (valve) stenosis: Secondary | ICD-10-CM | POA: Diagnosis not present

## 2016-02-02 DIAGNOSIS — E785 Hyperlipidemia, unspecified: Secondary | ICD-10-CM

## 2016-02-02 DIAGNOSIS — Z889 Allergy status to unspecified drugs, medicaments and biological substances status: Secondary | ICD-10-CM

## 2016-02-02 DIAGNOSIS — I10 Essential (primary) hypertension: Secondary | ICD-10-CM

## 2016-02-02 DIAGNOSIS — I2510 Atherosclerotic heart disease of native coronary artery without angina pectoris: Secondary | ICD-10-CM | POA: Diagnosis not present

## 2016-02-02 DIAGNOSIS — I2119 ST elevation (STEMI) myocardial infarction involving other coronary artery of inferior wall: Secondary | ICD-10-CM | POA: Diagnosis not present

## 2016-02-02 DIAGNOSIS — Z955 Presence of coronary angioplasty implant and graft: Secondary | ICD-10-CM

## 2016-02-02 DIAGNOSIS — E1159 Type 2 diabetes mellitus with other circulatory complications: Secondary | ICD-10-CM

## 2016-02-02 DIAGNOSIS — Z789 Other specified health status: Secondary | ICD-10-CM

## 2016-02-02 DIAGNOSIS — Z9861 Coronary angioplasty status: Secondary | ICD-10-CM

## 2016-02-02 DIAGNOSIS — T82855D Stenosis of coronary artery stent, subsequent encounter: Secondary | ICD-10-CM

## 2016-02-02 DIAGNOSIS — E669 Obesity, unspecified: Secondary | ICD-10-CM

## 2016-02-02 MED ORDER — LOSARTAN POTASSIUM 100 MG PO TABS: 100.0000 mg | ORAL_TABLET | Freq: Every day | ORAL | Status: DC

## 2016-02-02 NOTE — Progress Notes (Signed)
PCP: [name], MD  Clinic Note: Chief Complaint  Patient presents with  . Follow-up    ECHO/ 6 months   pt c/o SOB on exertion, swelling in ankles when on feet too long  . Coronary Artery Disease  . Aortic Stenosis    New Dx - Mild-Mod.    HPI: [name] is a 80 y.o. male with a PMH below who presents today for Six-month follow-up of CAD dating back to 6. He has recently had AAA repair by Dr. Kellie Simmering. When I last saw him in October 2016, this was preoperatively for risk stratification.. He has a long-standing history of CAD dating back to 12. Most recent cardiac evaluation and treatment was on for her staged PCI back in August of 2015 weighted PCI to in-stent restenosis in the RCA as well as OM1 in both areas treated with DES stents. He then had a cardiac catheterization shortly thereafter showing widely patent stents and grafts. He is intolerant of statin. Currently on Zetia plus fenofibrate with consideration for possible conversion to PC SK 9 inhibitor. Last Cath 05/2014  BRAEDAN MEUTH was last seen on 07/23/2015 -- he is doing relatively well without any major complaints. He still noted exertional dyspnea over exertion or rushing upstairs.No real change since his CABG.  Recent Hospitalizations: For his AAA repair Nov 2016 (f/u with Dr. Kellie Simmering - next month)  Studies Reviewed:   Echo 07/2015: Normal pump function and wall motion - EF 55-60%. Not unexpectedly, abnormal relaxation (normal with age). NEW FINDING: the aortic valve that was previocadusly just calcified (sclerotic) without any flow limitation, is now Mild to Moderately "Stenotic" - i.e. Narrowed with some flow limitation. At this level of "Stenosis" - will will follow-up in ~ 1 year. Peak gradient 18 mmHg, mean gradient 10 mmHg   Interval History: [name] presents today relatively well. He says that his exertional dyspnea is pretty stable. This is nothing new and he goes back a long time. He  remains active doing what he can, but is not able to do "a lot ". Mostly these limited by back and knee pain as opposed to dyspnea. Bone-on-bone osteoarthritis pain. He really charts his exertional dyspnea of to his lung disease and not to any heart failure symptoms. He has not had any recurrent anginal symptoms such as chest tightness or pressure with rest or exertion. No PND or orthopnea, but he does have some mild lower extremity edema if he is on his feet for long time.  He tells me his blood pressures at home this morning was 139/65. He surprised that the report here today suggested a much higher blood pressure.  Cardiac Review of Symptoms No palpitations, lightheadedness, dizziness, weakness or syncope/near syncope. No TIA/amaurosis fugax symptoms. No melena, hematochezia, hematuria, or epstaxis. No claudication.  ROS: A comprehensive was performed. Review of Systems  Constitutional: Negative for malaise/fatigue.  HENT: Negative for congestion and nosebleeds.   Respiratory: Positive for shortness of breath (stable baseline exertional dyspnea). Negative for cough and wheezing.   Cardiovascular: Positive for leg swelling (ankles swell when on feet long tme (chronic)). Negative for chest pain, palpitations, orthopnea, claudication and PND.       Per HPI  Genitourinary: Positive for frequency (~1-2 hr nocturia).  Musculoskeletal: Positive for back pain. Joint pain: needs L knee replaced 2/2 OA.  Neurological: Negative for dizziness, tingling, tremors, sensory change, speech change, focal weakness, seizures, loss of consciousness, weakness and headaches.  Endo/Heme/Allergies: Bruises/bleeds easily.  Psychiatric/Behavioral: Negative  for memory loss. The patient does not have insomnia.   All other systems reviewed and are negative.   Past Medical History  Diagnosis Date  . History of: ST elevation myocardial infarction (STEMI) involving left circumflex coronary artery with complication 1610      PTCA-circumflex; PCI in 1991  . CAD, multiple vessel 1985-2010    Most recent cath 01/01/09: 100% Occluded LAD after SP1, patent LIMA-distal LAD with apical 90% lesion.  Cx-OM1 widely patent stent into proximal bifurcating OM1 . Follow-on Cx => 70-80% -- non-amenable PCI. Widely patent 3 overlapping stents in midRCA with only 40% RPL stenosis; SVG- OM known to be occluded.  . S/P CABG x 2 1997    LIMA-LAD, SVG-OM  . H/O unstable angina 06/2003, 12/2006, 05/2014    a) '04: Staged Taxus DES PCI to RCA and Cx-OM;;'08 - PCI to proximal ISR in RCA - Cypher DES; c) 05/2014: ISR in RCA & OM1, PCI with Promus P DES: RCA  2.75 x 12, OM1 3.0 mm x 8)  . History of nuclear stress test December 2013    LOW at Risk. Moderate region of mid to basal inferolateral scar without ischemia -- consistent with distal circumflex disease. Mild apical hypokinesis with an EF of 47%.  . Irregular heart beat     PVCs and PACs, no arrhythmia recorded  . Exertional dyspnea     Chronic baseline SOB with ambulation  . Diabetes mellitus     Not on oral medication  . Hypertension   . Dyslipidemia, goal LDL below 70   . Osteoarthritis of both knees     And back; multiple back surgeries, right knee arthroplasty and left knee arthroscopic surgery x2  . Chronic back pain      multiple surgeries; C-spine and lumbar  . Erectile dysfunction   . H/O: pneumonia February 14  . Chronic anemia     On iron supplement; history of positive guaiac - negative colonoscopy in 1996.; Thought to be related to hemorrhoids; status post hemorrhoidectomy  . Ankle edema     Chronic  . Diverticulitis of colon 1996  . Choledocholithiasis   . Hemorrhoids   . Diverticulosis   . Adenomatous colon polyp   . Hiatal hernia   . Cholelithiasis - with cholangitis     status post ERCP with removal of calculi and biliary stent placement.  . Sleep apnea     pt. states he was told to return for f/u, to be fitted for Cpap, but pt. reports that he didn't  follow up-no cpap used  . AAA (abdominal aortic aneurysm) ([place]) 08/30/12    Doppler 08/30/12 had a small amount of growth. It was a 4.2 x 4.3 cm greater in size than the previous one noted at 4 x 3.8.  05-21-15 followed by CT abd-Dr. Kellie Simmering.  Marland Kitchen GERD (gastroesophageal reflux disease)   . Moderate aortic stenosis by prior echocardiogram October 2016    Normal LV function - EF 55-60%.. Abnormal relaxation. Mild-moderate aortic stenosis (peak/mean gradient 18/10 mmH)    Past Surgical History  Procedure Laterality Date  . Coronary angioplasty  [id number]    1985 lateral STEMI Circumflex PTCA  . Anterior lat lumbar fusion Left 11/22/2012    Procedure: ANTERIOR LATERAL LUMBAR FUSION 1 LEVEL;  Surgeon: Eustace Moore, MD;  Location: [place] [facility];  Service: Neurosurgery;  Laterality: Left;  Anterior Lateral Lumbar Fusion Lumbar Three-Four  . Lumbar percutaneous pedicle screw 1 level N/A 11/22/2012    Procedure: LUMBAR  PERCUTANEOUS PEDICLE SCREW 1 LEVEL;  Surgeon: Eustace Moore, MD;  Location: [place];  Service: Neurosurgery;  Laterality: N/A;  Lumbar Three-Four Percutaneous Pedicle Screw, Lateral approach  . Back surgery  1979 & x 10    pt. remarks, "I have had about 10 back surgeries"  . Posterior cervical fusion/foraminotomy N/A 05/02/2013    Procedure: POSTERIOR CERVICAL FUSION/FORAMINOTOMY CERVICAL SEVEN THORACIC-ONE;  Surgeon: Eustace Moore, MD;  Location: [place] [facility];  Service: Neurosurgery;  Laterality: N/A;  POSTERIOR CERVICAL FUSION/FORAMINOTOMY CERVICAL SEVEN THORACIC-ONE  . Coronary artery bypass graft  1990    LIMA-LAD, SVG-OM  . Cardiac catheterization  September 2004    None Occluded vein graft to OM; diffuse RCA disease in the mid vessel, 80% circumflex-OM stenosis; follow on AV groove circumflex with sequential 90% stenoses and intervening saccular dilation   . Percutaneous coronary stent intervention (pci-s)  September 2004    PCI - RCA 2 overlapping Taxus DES 2.75 mm x 32 mm and  2.75 mm x 12 mm (3.0 mm); PCI-Cx-OM1 - Taxus DES 3.0 mm x 20 mm (3.1 mm);   Marland Kitchen Percutaneous coronary stent intervention (pci-s)  March 2008    80% ISR in proximal Taxus stent in RCA -- covered proximally with Cypher DES 3.0 mm x 12 mm  . Cardiac catheterization  March 2010    4 abnormal Myoview showing apical thinning (possibly due to apical LAD 95%) : 100% Occluded LAD after SV1, distal LAD grafted via LIMA -apical 95% . Cx -OM1 w/patent stent extending into OM 1 . Follow on Cx - 70-80% - non-amenable PCI. RCA widely patent 3 overlapping stents in mRCA w/less than 40% stenosisin RPL; SVG-OM known occluded   . Cervical discectomy  04/23/10    Decompressive anterior carvical diskectomy. C4-5, C6-7  . Cervical arthrodesis  04/23/10    Anterior cervical arthrodesis, C4-5, C6-7 utilizing 7-mm PEEK interbody cage packed with local autograft & Antifuse putty at C4-5 & an 8-mm cage at C6-7.  Marland Kitchen Anterior cervical plating  04/23/10    At C4-5 and a C6-7 utilizing two separate Biomet MaxAn plates.  . Colonoscopy  1996  . Minor hemorrhoidectomy    . Total knee arthroplasty Right   . Knee arthroscopy Left     x 2  . Nm myoview ltd  December 2013    LOW RISK. Mmoderate region of mid to basal inferolateral scar without ischemia. Mild apical hypokinesis with an EF of 47%.  . Transthoracic echocardiogram  December 2013; October 2016    a. EF 55-60%. mod LA dilation. Aortic Sclerosis;; b. EF 55-60%, Gr 1 DD, Mild-Mod AS (Peark - Mean Gradient 18-10 mmHg)  . Abdominal and lower extremity arterial ultrasound  08/23/2012; 10/10/2013    Normal ABIs. Nonocclusive lower extremity disease. 4.2 cm x 4.3 cm infrarenal AAA;; 4.4 cm x 4.3 cm (essentially stable)   . Cataract extraction    . Ercp N/A 07/03/2014    Procedure: ENDOSCOPIC RETROGRADE CHOLANGIOPANCREATOGRAPHY (ERCP);  Surgeon: Inda Castle, MD;  Location: [facility];  Service: Endoscopy;  Laterality: N/A;  . Biliary stent placement N/A 07/03/2014     Procedure: BILIARY STENT PLACEMENT;  Surgeon: Inda Castle, MD;  Location: [place] [facility];  Service: Endoscopy;  Laterality: N/A;  . Ercp N/A 07/05/2014    Procedure: ENDOSCOPIC RETROGRADE CHOLANGIOPANCREATOGRAPHY (ERCP);  Surgeon: Inda Castle, MD;  Location: [place] [facility];  Service: Endoscopy;  Laterality: N/A;  . Left heart catheterization with coronary angiogram N/A 05/15/2014  Procedure: LEFT HEART CATHETERIZATION WITH CORONARY ANGIOGRAM;  Surgeon: Sinclair Grooms, MD;  Location[facility] C[facility] LAB;  Service: Cardiovascular;  Laterality: N/A;  . Percutaneous coronary stent intervention (pci-s) N/A 05/17/2014    Procedure: PERCUTANEOUS CORONARY STENT INTERVENTION (PCI-S);  Surgeon: Sinclair Grooms, MD;  Location: [facility] CATH LAB;  Service: Cardiovascular;  Laterality: N/A;  . Left heart catheterization with coronary/graft angiogram N/A 06/28/2014    Procedure: LEFT HEART CATHETERIZATION WITH Beatrix Fetters;  Surgeon: Troy Sine, MD;  Location: [facility] CATH LAB;  Service: Cardiovascular;  Laterality: N/A;  . Cholecystectomy N/A 05/23/2015    Procedure: LAPAROSCOPIC CHOLECYSTECTOMY WITH INTRAOPERATIVE CHOLANGIOGRAM;  Surgeon: Alphonsa Overall, MD;  Location: [facility];  Service: General;  Laterality: N/A;  . Ercp N/A 06/30/2015    Procedure: ENDOSCOPIC RETROGRADE CHOLANGIOPANCREATOGRAPHY (ERCP);  Surgeon: Ladene Artist, MD;  Location: [facility];  Service: Endoscopy;  Laterality: N/A;  . Sphincterotomy N/A 06/30/2015    Procedure: SPHINCTEROTOMY;  Surgeon: Ladene Artist, MD;  Location: [facility];  Service: Endoscopy;  Laterality: N/A;  . Abdominal aortic endovascular stent graft N/A 08/13/2015    Procedure: ABDOMINAL AORTIC ENDOVASCULAR STENT GRAFT;  Surgeon: Mal Misty, MD;  Location: [place];  Service: Vascular;  Laterality: N/A;    Prior to Admission medications   Medication Sig Start Date End Date Taking? Authorizing Provider  acetaminophen (TYLENOL) 500 MG tablet Take 1,000 mg by mouth  every 6 (six) hours as needed for mild pain, moderate pain or headache.   Yes Historical Provider, MD  albuterol (PROVENTIL HFA;VENTOLIN HFA) 108 (90 BASE) MCG/ACT inhaler Inhale 2 puffs into the lungs every 6 (six) hours as needed for wheezing or shortness of breath. 09/06/15  Yes Camelia Eng Tysinger, PA-C  amLODipine (NORVASC) 5 MG tablet TAKE 5 MG BY MOUTH DAILY 06/10/15  Yes Brett Canales, PA-C  aspirin 81 MG tablet Take 81 mg by mouth daily.   Yes Historical Provider, MD  Choline Fenofibrate (FENOFIBRIC ACID) 135 MG CPDR TAKE ONE CAPSULE BY MOUTH ONCE DAILY Patient taking differently: TAKE 135 MG BY MOUTH DAILY 01/20/15  Yes Leonie Man, MD  clopidogrel (PLAVIX) 75 MG tablet TAKE ONE TABLET BY MOUTH ONCE DAILY Patient taking differently: TAKE 75 MG BY MOUTH ONCE DAILY 01/10/15  Yes Leonie Man, MD  ferrous sulfate 325 (65 FE) MG tablet Take 325 mg by mouth daily with breakfast.   Yes Historical Provider, MD  glucose blood test strip Pt uses one touch verio test strips and tests twice daily. 12/18/15  Yes Denita Lung, MD  losartan (COZAAR) 50 MG tablet TAKE ONE TABLET BY MOUTH ONCE DAILY 01/15/16  Yes Denita Lung, MD  Multiple Vitamins-Minerals (MULTIVITAMIN WITH MINERALS) tablet Take 1 tablet by mouth daily.    Yes Historical Provider, MD  oxyCODONE (ROXICODONE) 5 MG immediate release tablet Take 1 tablet (5 mg total) by mouth every 6 (six) hours as needed. 08/13/15  Yes Samantha J Rhyne, PA-C  pantoprazole (PROTONIX) 40 MG tablet TAKE ONE TABLET BY MOUTH ONCE DAILY Patient taking differently: TAKE 40 MG BY MOUTH DAILY 02/04/15  Yes Leonie Man, MD  pioglitazone-metformin (ACTOPLUS MET) 15-500 MG tablet TAKE ONE TABLET BY MOUTH ONCE DAILY 11/17/15  Yes Denita Lung, MD  sildenafil (REVATIO) 20 MG tablet Take 2-5 pills daily as needed 10/08/14  Yes Denita Lung, MD   Allergies  Allergen Reactions  . Lisinopril Cough    Rxn: unknown  . Statins Other (See  Comments)    Muscle ache  .  Welchol [Colesevelam Hcl] Itching     Social History   Social History  . Marital Status: Married    Spouse Name: N/A  . Number of Children: N/A  . Years of Education: N/A   Social History Main Topics  . Smoking status: Former Smoker    Types: Cigarettes    Quit date: 10/05/1983  . Smokeless tobacco: Never Used  . Alcohol Use: 1.8 oz/week    3 Cans of beer per week     Comment: 3 or more cans of beer per day - 05/21/15  . Drug Use: No  . Sexual Activity: Not Currently   Other Topics Concern  . None   Social History Narrative   He is married, father of two, grandfather to 2, great grandfather to two.    Not really getting much exercise now, do to his significant back and hip pain.    He does not smoke and only has an alcoholic beverage.    Family History  Problem Relation Age of Onset  . Arthritis Mother   . Diabetes Father   . Heart disease Father   . Stroke Sister   . Hypertension Sister   . Heart disease Sister   . Diabetes Sister   . Breast cancer Sister   . Heart disease Brother   . Ulcers Brother   . Colon cancer Neg Hx     Wt Readings from Last 3 Encounters:  02/02/16 214 lb 6.4 oz (97.251 kg)  09/30/15 213 lb 12.8 oz (96.979 kg)  09/12/15 210 lb (95.255 kg)    PHYSICAL EXAM BP 150/80 mmHg  Pulse 62  Ht '5\' 9"'  (1.753 m)  Wt 214 lb 6.4 oz (97.251 kg)  BMI 31.65 kg/m2 General appearance: A&Ox3, cooperative, appears stated age, NAD. Pleasant mood and affect, answers questions appropriately.  HEENT: [place]/AT, EOMI, MMM, anicteric sclera Neck: no adenopathy, no carotid bruit, no JVD and supple, symmetrical, trachea midline  Lungs: CTAB, normal percussion bilaterally and Good air movement, nonlabored, no W./ R./R.  Heart: normal apical impulse, RRR, S1, S2 normal and 1-2/6 SEM at value S/P without radiation. No R./G.  Abdomen: soft, mild RUQ tenderness. No HSM Extremities: No C/C/E  Pulses: 2+ and symmetric  Neurologic: Grossly normal      Adult ECG Report  Rate: 62 ;  Rhythm: normal sinus rhythm and 1 AVB (PR interval 224), occasional PVCs. LVH. Inferior MI, age undetermined;   Narrative Interpretation: Stable EKG but LVH criteria and PVCs are new.   Other studies Reviewed: Additional studies/ records that were reviewed today include:  Recent Labs: PCP  Lab Results  Component Value Date   CHOL 156 01/09/2015   HDL 44 01/09/2015   LDLCALC 99 01/09/2015   TRIG 64 01/09/2015   CHOLHDL 3.5 01/09/2015    ASSESSMENT / PLAN: Problem List Items Addressed This Visit    Statin intolerance   Relevant Orders   EKG 12-Lead   ECHOCARDIOGRAM COMPLETE   Presence of drug coated stent in right coronary artery and circumflex coronary artery (Chronic)   Relevant Orders   EKG 12-Lead   ECHOCARDIOGRAM COMPLETE   Obesity (BMI 30-39.9) (Chronic)   Relevant Orders   EKG 12-Lead   ECHOCARDIOGRAM COMPLETE   Moderate aortic stenosis by prior echocardiogram (Chronic)    New finding on echocardiogram done last year. His murmur isn't that worrisome however. I think we can follow-up with neck cardiac exam this year and if stable  will probably check every other year.      Relevant Medications   losartan (COZAAR) 100 MG tablet   Other Relevant Orders   EKG 12-Lead   ECHOCARDIOGRAM COMPLETE   Hypertension associated with diabetes (HCC) (Chronic)    Blood pressures, high today as opposed to his pressure at home. At still higher than I would like to be. Plan: Increase Cozaar to 100 mg daily.      Relevant Medications   losartan (COZAAR) 100 MG tablet   Other Relevant Orders   EKG 12-Lead   ECHOCARDIOGRAM COMPLETE   Hyperlipidemia LDL goal <70; statin intolerant (Chronic)    He stopped taking Zetia I think because of cost. Remains on fenofibric acid. We considered referral for consideration of Repatha. He is due for a lipid check -- will need to contact him to have this checked.. If still out of control, will refer to lipid clinic.        Relevant Medications   losartan (COZAAR) 100 MG tablet   Other Relevant Orders   EKG 12-Lead   ECHOCARDIOGRAM COMPLETE   History of ST elevation myocardial infarction (STEMI) of inferolateral wall (PTCA - 100% large lateral OM) (Chronic)    Infarct noted on Myoview, but no ischemia. Interestingly, there was no suggestion of regional wall motion abnormality on echo cardiogram. EF was 55-60%. Has remained stable with no further symptoms. His exertional dyspnea predates his MI.      Relevant Medications   losartan (COZAAR) 100 MG tablet   Other Relevant Orders   EKG 12-Lead   ECHOCARDIOGRAM COMPLETE   Coronary stent restenosis with uncertain cause: Required PCI for ISR of OM1 (Promus P 2.75 mm x 12 mm) & pRCA (Chronic)    Far enough out from his most recent PCI that we can to stay on single platelet agent. He converted from Brilinta to Plavix.      Relevant Orders   EKG 12-Lead   ECHOCARDIOGRAM COMPLETE   Chronic diastolic CHF (congestive heart failure) (HCC) (Chronic)    I'm sure this plays some role in his exertional dyspnea, but no real active heart failure symptoms. He is on now increased dose of ARB along with amlodipine for additional blood pressure/afterload adduction.      Relevant Medications   losartan (COZAAR) 100 MG tablet   Other Relevant Orders   EKG 12-Lead   ECHOCARDIOGRAM COMPLETE   CAD S/P CABG '95- several PCis since, last 05/17/14 - Primary (Chronic)    Most recent PCI in 2015. No recurrent anginal symptoms. On aspirin plus Plavix. We can stop aspirin. On ARB and calcium channel blocker. Not on beta blocker because of history of bradycardia and COPD. Statin intolerant. On Fenofibric Acid      Relevant Medications   losartan (COZAAR) 100 MG tablet   Other Relevant Orders   EKG 12-Lead   ECHOCARDIOGRAM COMPLETE      Current medicines are reviewed at length with the patient today. (+/- concerns) n/a The following changes have been made:  INCREASE  TO 100 MG LOSARTAN  ONE TABLET DAILY  YOU MAY STOP TAKING BABY ASPIRIN.  [place] OCT 2017Your physician has requested that you have an echocardiogram at [address]  Studies Ordered:   Orders Placed This Encounter  Procedures  . EKG 12-Lead  . ECHOCARDIOGRAM COMPLETE   follow-up in NOV 2017 WITH DR HARDING.     Leonie Man, M.D., M.S. Interventional Cardiologist   Pager # [phone] Phone # [phone]  [place] [place],  [address]

## 2016-02-02 NOTE — Patient Instructions (Signed)
INCREASE TO 100 MG LOSARTAN  ONE TABLET DAILY  YOU MAY STOP TAKING BABY ASPIRIN.  [place] OCT 2017Your physician has requested that you have an echocardiogram at [address]. Echocardiography is a painless test that uses sound waves to create images of your heart. It provides your doctor with information about the size and shape of your heart and how well your heart's chambers and valves are working. This procedure takes approximately one hour. There are no restrictions for this procedure.   Your physician wants you to follow-up in NOV 2017 [place]. You will receive a reminder letter in the mail two months in advance. If you don't receive a letter, please call our office to schedule the follow-up appointment.  If you need a refill on your cardiac medications before your next appointment, please call your pharmacy.

## 2016-02-03 ENCOUNTER — Encounter: Payer: Self-pay | Admitting: Cardiology

## 2016-02-04 ENCOUNTER — Encounter: Payer: Self-pay | Admitting: Cardiology

## 2016-02-04 NOTE — Assessment & Plan Note (Signed)
New finding on echocardiogram done last year. His murmur isn't that worrisome however. I think we can follow-up with neck cardiac exam this year and if stable will probably check every other year.

## 2016-02-04 NOTE — Assessment & Plan Note (Signed)
Far enough out from his most recent PCI that we can to stay on single platelet agent. He converted from Brilinta to Plavix.

## 2016-02-04 NOTE — Assessment & Plan Note (Signed)
Blood pressures, high today as opposed to his pressure at home. At still higher than I would like to be. Plan: Increase Cozaar to 100 mg daily.

## 2016-02-04 NOTE — Assessment & Plan Note (Signed)
Infarct noted on Myoview, but no ischemia. Interestingly, there was no suggestion of regional wall motion abnormality on echo cardiogram. EF was 55-60%. Has remained stable with no further symptoms. His exertional dyspnea predates his MI.

## 2016-02-04 NOTE — Assessment & Plan Note (Signed)
Most recent PCI in 2015. No recurrent anginal symptoms. On aspirin plus Plavix. We can stop aspirin. On ARB and calcium channel blocker. Not on beta blocker because of history of bradycardia and COPD. Statin intolerant. On Fenofibric Acid

## 2016-02-04 NOTE — Telephone Encounter (Signed)
Left message for pt

## 2016-02-04 NOTE — Assessment & Plan Note (Signed)
He stopped taking Zetia I think because of cost. Remains on fenofibric acid. We considered referral for consideration of Repatha. He is due for a lipid check -- will need to contact him to have this checked.. If still out of control, will refer to lipid clinic.

## 2016-02-04 NOTE — Assessment & Plan Note (Signed)
I'm sure this plays some role in his exertional dyspnea, but no real active heart failure symptoms. He is on now increased dose of ARB along with amlodipine for additional blood pressure/afterload adduction.

## 2016-02-09 ENCOUNTER — Other Ambulatory Visit: Payer: Self-pay

## 2016-02-09 MED ORDER — CLOPIDOGREL BISULFATE 75 MG PO TABS: ORAL_TABLET | ORAL | Status: DC

## 2016-02-13 ENCOUNTER — Telehealth: Payer: Self-pay | Admitting: *Deleted

## 2016-02-13 ENCOUNTER — Other Ambulatory Visit: Payer: Self-pay | Admitting: Cardiology

## 2016-02-13 DIAGNOSIS — Z79899 Other long term (current) drug therapy: Secondary | ICD-10-CM

## 2016-02-13 DIAGNOSIS — E785 Hyperlipidemia, unspecified: Secondary | ICD-10-CM

## 2016-02-13 DIAGNOSIS — N529 Male erectile dysfunction, unspecified: Secondary | ICD-10-CM

## 2016-02-13 DIAGNOSIS — Z789 Other specified health status: Secondary | ICD-10-CM

## 2016-02-13 NOTE — Telephone Encounter (Signed)
Rx(s) sent to pharmacy electronically.  

## 2016-02-13 NOTE — Telephone Encounter (Signed)
INFORMED PATIENT A LETTER WILL BE SENT FOR LABS LIPIDS HEPATIC FASTING

## 2016-02-13 NOTE — Telephone Encounter (Signed)
-----   Message from Leonie Man, MD sent at 02/04/2016 11:52 PM EDT ----- Regarding: Forgot to order lipids Ivin Booty, I forgot to order fasting lipid panel and LFTs on this gentleman. No big rush, but this should be checked.  [place]

## 2016-02-19 ENCOUNTER — Other Ambulatory Visit: Payer: Self-pay | Admitting: Family Medicine

## 2016-02-19 NOTE — Telephone Encounter (Signed)
Is this okay to refill? 

## 2016-02-20 ENCOUNTER — Other Ambulatory Visit: Payer: Self-pay | Admitting: Cardiology

## 2016-02-20 NOTE — Telephone Encounter (Signed)
Rx(s) sent to pharmacy electronically.  

## 2016-03-02 DIAGNOSIS — N528 Other male erectile dysfunction: Secondary | ICD-10-CM | POA: Diagnosis not present

## 2016-03-02 DIAGNOSIS — Z79899 Other long term (current) drug therapy: Secondary | ICD-10-CM | POA: Diagnosis not present

## 2016-03-02 DIAGNOSIS — Z889 Allergy status to unspecified drugs, medicaments and biological substances status: Secondary | ICD-10-CM | POA: Diagnosis not present

## 2016-03-02 DIAGNOSIS — N529 Male erectile dysfunction, unspecified: Secondary | ICD-10-CM | POA: Diagnosis not present

## 2016-03-02 DIAGNOSIS — E785 Hyperlipidemia, unspecified: Secondary | ICD-10-CM | POA: Diagnosis not present

## 2016-03-03 LAB — HEPATIC FUNCTION PANEL
ALBUMIN: 4.1 g/dL (ref 3.6–5.1)
ALK PHOS: 30 U/L — AB (ref 40–115)
ALT: 22 U/L (ref 9–46)
AST: 30 U/L (ref 10–35)
BILIRUBIN TOTAL: 0.7 mg/dL (ref 0.2–1.2)
Bilirubin, Direct: 0.2 mg/dL (ref <=0.2)
Indirect Bilirubin: 0.5 mg/dL (ref 0.2–1.2)
Total Protein: 6.4 g/dL (ref 6.1–8.1)

## 2016-03-03 LAB — LIPID PANEL
Cholesterol: 182 mg/dL (ref 125–200)
HDL: 64 mg/dL (ref >=40)
LDL Cholesterol: 105 mg/dL (ref <130)
Total CHOL/HDL Ratio: 2.8 Ratio (ref <=5.0)
Triglycerides: 64 mg/dL (ref <150)
VLDL: 13 mg/dL (ref <30)

## 2016-03-04 ENCOUNTER — Other Ambulatory Visit: Payer: Self-pay | Admitting: Vascular Surgery

## 2016-03-04 DIAGNOSIS — I714 Abdominal aortic aneurysm, without rupture: Secondary | ICD-10-CM | POA: Diagnosis not present

## 2016-03-04 LAB — CREATININE, SERUM: Creat: 1.65 mg/dL — ABNORMAL HIGH (ref 0.70–1.18)

## 2016-03-09 ENCOUNTER — Ambulatory Visit
Admission: RE | Admit: 2016-03-09 | Discharge: 2016-03-09 | Disposition: A | Discharge status: Home / Self Care | Payer: Medicare Other | Source: Ambulatory Visit | Attending: Vascular Surgery | Admitting: Vascular Surgery

## 2016-03-09 DIAGNOSIS — I714 Abdominal aortic aneurysm, without rupture, unspecified: Secondary | ICD-10-CM

## 2016-03-09 MED ORDER — IOPAMIDOL (ISOVUE-370) INJECTION 76%
50.0000 mL | Freq: Once | INTRAVENOUS | Status: AC | PRN
  Administered 2016-03-09: 50 mL via INTRAVENOUS

## 2016-03-11 ENCOUNTER — Encounter: Payer: Self-pay | Admitting: Vascular Surgery

## 2016-03-16 ENCOUNTER — Telehealth: Payer: Self-pay | Admitting: *Deleted

## 2016-03-16 ENCOUNTER — Encounter: Payer: Self-pay | Admitting: Vascular Surgery

## 2016-03-16 ENCOUNTER — Ambulatory Visit (INDEPENDENT_AMBULATORY_CARE_PROVIDER_SITE_OTHER): Payer: Medicare Other | Admitting: Vascular Surgery

## 2016-03-16 ENCOUNTER — Telehealth: Payer: Self-pay | Admitting: Cardiology

## 2016-03-16 VITALS — BP 142/79 | HR 60 | Temp 97.0°F | Resp 16 | Ht 69.0 in | Wt 217.0 lb

## 2016-03-16 DIAGNOSIS — T82330A Leakage of aortic (bifurcation) graft (replacement), initial encounter: Secondary | ICD-10-CM | POA: Insufficient documentation

## 2016-03-16 DIAGNOSIS — T82330D Leakage of aortic (bifurcation) graft (replacement), subsequent encounter: Secondary | ICD-10-CM

## 2016-03-16 DIAGNOSIS — Z8679 Personal history of other diseases of the circulatory system: Secondary | ICD-10-CM | POA: Insufficient documentation

## 2016-03-16 DIAGNOSIS — IMO0001 Reserved for inherently not codable concepts without codable children: Secondary | ICD-10-CM

## 2016-03-16 DIAGNOSIS — I2510 Atherosclerotic heart disease of native coronary artery without angina pectoris: Secondary | ICD-10-CM | POA: Diagnosis not present

## 2016-03-16 DIAGNOSIS — Z9861 Coronary angioplasty status: Secondary | ICD-10-CM | POA: Diagnosis not present

## 2016-03-16 DIAGNOSIS — Z9889 Other specified postprocedural states: Secondary | ICD-10-CM | POA: Insufficient documentation

## 2016-03-16 NOTE — Telephone Encounter (Signed)
-----   Message from Leonie Man, MD sent at 03/15/2016  9:07 PM EDT ----- Lipid levels look okay, a little bit up from last check since Zetia was stopped. Still not at goal however. LDL is targeted less than 70 and is currently only 105. I would like to see if we can potentially do a little better than this.  I was considering referral to our pharmacist's lipid management clinic  Glenetta Hew, MD

## 2016-03-16 NOTE — Telephone Encounter (Signed)
Spoke to patient.  LAB Result given . Verbalized understanding  aware will make an appointment with [facility] to discuss if any option is needed Sent to scheduler to call patient

## 2016-03-16 NOTE — Progress Notes (Signed)
Subjective:     Patient ID: [name], male   DOB: [birth year], 80 y.o.   MRN: [id number]  HPI this 80 year old male returns for continued follow-up regarding his abdominal aortic stent graft repair for an abdominal aortic aneurysm which I performed 08/13/2015. Patient has no specific complaints relative to the surgery. 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 He's had no new abdominal or back symptoms.  Past Medical History  Diagnosis Date  . History of: ST elevation myocardial infarction (STEMI) involving left circumflex coronary artery with complication 5366    PTCA-circumflex; PCI in 1991  . CAD, multiple vessel 1985-2010    Most recent cath 01/01/09: 100% Occluded LAD after SP1, patent LIMA-distal LAD with apical 90% lesion.  Cx-OM1 widely patent stent into proximal bifurcating OM1 . Follow-on Cx => 70-80% -- non-amenable PCI. Widely patent 3 overlapping stents in midRCA with only 40% RPL stenosis; SVG- OM known to be occluded.  . S/P CABG x 2 1997    LIMA-LAD, SVG-OM  . H/O unstable angina 06/2003, 12/2006, 05/2014    a) '04: Staged Taxus DES PCI to RCA and Cx-OM;;'08 - PCI to proximal ISR in RCA - Cypher DES; c) 05/2014: ISR in RCA & OM1, PCI with Promus P DES: RCA  2.75 x 12, OM1 3.0 mm x 8)  . History of nuclear stress test December 2013    LOW at Risk. Moderate region of mid to basal inferolateral scar without ischemia -- consistent with distal circumflex disease. Mild apical hypokinesis with an EF of 47%.  . Irregular heart beat     PVCs and PACs, no arrhythmia recorded  . Exertional dyspnea     Chronic baseline SOB with ambulation  . Diabetes mellitus     Not on oral medication  . Hypertension   . Dyslipidemia, goal LDL below 70   . Osteoarthritis of both knees     And back; multiple back surgeries, right knee arthroplasty and left knee arthroscopic surgery x2  . Chronic back pain      multiple surgeries; C-spine  and lumbar  . Erectile dysfunction   . H/O: pneumonia February 14  . Chronic anemia     On iron supplement; history of positive guaiac - negative colonoscopy in 1996.; Thought to be related to hemorrhoids; status post hemorrhoidectomy  . Ankle edema     Chronic  . Diverticulitis of colon 1996  . Choledocholithiasis   . Hemorrhoids   . Diverticulosis   . Adenomatous colon polyp   . Hiatal hernia   . Cholelithiasis - with cholangitis     status post ERCP with removal of calculi and biliary stent placement.  . Sleep apnea     pt. states he was told to return for f/u, to be fitted for Cpap, but pt. reports that he didn't follow up-no cpap used  . AAA (abdominal aortic aneurysm) ([place]) 08/30/12    Doppler 08/30/12 had a small amount of growth. It was a 4.2 x 4.3 cm greater in size than the previous one noted at 4 x 3.8.  05-21-15 followed by CT abd-Dr. Kellie Simmering.  Marland Kitchen GERD (gastroesophageal reflux disease)   . Moderate aortic stenosis by prior echocardiogram October 2016    Normal LV function - EF 55-60%.. Abnormal relaxation. Mild-moderate aortic stenosis (peak/mean gradient 18/10 mmH)    Social History  Substance Use Topics  . Smoking status: Former Smoker    Types: Cigarettes  Quit date: 10/05/1983  . Smokeless tobacco: Never Used  . Alcohol Use: 1.8 oz/week    3 Cans of beer per week     Comment: 3 or more cans of beer per day - 05/21/15    Family History  Problem Relation Age of Onset  . Arthritis Mother   . Diabetes Father   . Heart disease Father   . Stroke Sister   . Hypertension Sister   . Heart disease Sister   . Diabetes Sister   . Breast cancer Sister   . Heart disease Brother   . Ulcers Brother   . Colon cancer Neg Hx     Allergies  Allergen Reactions  . Lisinopril Cough    Rxn: unknown  . Statins Other (See Comments)    Muscle ache  . Welchol [Colesevelam Hcl] Itching     Current outpatient prescriptions:  .  amLODipine (NORVASC) 5 MG tablet, TAKE 5 MG  BY MOUTH DAILY, Disp: 90 tablet, Rfl: 3 .  Choline Fenofibrate (FENOFIBRIC ACID) 135 MG CPDR, TAKE ONE CAPSULE BY MOUTH ONCE DAILY, Disp: 90 capsule, Rfl: 3 .  clopidogrel (PLAVIX) 75 MG tablet, TAKE 75 MG BY MOUTH ONCE DAILY, Disp: 90 tablet, Rfl: 3 .  ferrous sulfate 325 (65 FE) MG tablet, Take 325 mg by mouth daily with breakfast., Disp: , Rfl:  .  glucose blood test strip, Pt uses one touch verio test strips and tests twice daily., Disp: 100 each, Rfl: 2 .  losartan (COZAAR) 100 MG tablet, Take 1 tablet (100 mg total) by mouth daily., Disp: 90 tablet, Rfl: 3 .  Multiple Vitamins-Minerals (MULTIVITAMIN WITH MINERALS) tablet, Take 1 tablet by mouth daily. , Disp: , Rfl:  .  pantoprazole (PROTONIX) 40 MG tablet, TAKE ONE TABLET BY MOUTH ONCE DAILY, Disp: 90 tablet, Rfl: 3 .  pioglitazone-metformin (ACTOPLUS MET) 15-500 MG tablet, TAKE ONE TABLET BY MOUTH ONCE DAILY, Disp: 90 tablet, Rfl: 0 .  sildenafil (REVATIO) 20 MG tablet, TAKE 2-5 TABLETS BY MOUTH DAILY AS NEEDED, Disp: 50 tablet, Rfl: 5 .  acetaminophen (TYLENOL) 500 MG tablet, Take 1,000 mg by mouth every 6 (six) hours as needed for mild pain, moderate pain or headache. Reported on 03/16/2016, Disp: , Rfl:  .  albuterol (PROVENTIL HFA;VENTOLIN HFA) 108 (90 BASE) MCG/ACT inhaler, Inhale 2 puffs into the lungs every 6 (six) hours as needed for wheezing or shortness of breath. (Patient not taking: Reported on 03/16/2016), Disp: 1 Inhaler, Rfl: 0  Filed Vitals:   03/16/16 1349 03/16/16 1355  BP: 159/80 142/79  Pulse: 60 60  Temp: 97 F (36.1 C)   TempSrc: Oral   Resp: 16   Height: '5\' 9"'  (1.753 m)   Weight: 217 lb (98.431 kg)   SpO2: 97%     Body mass index is 32.03 kg/(m^2).           Review of Systems patient has remote history of coronary artery disease with coronary artery bypass grafting and stenting. He has no active cardiac symptoms at this time. Denies chest pain, dyspnea on exertion, PND, orthopnea, hemoptysis. He does  have bilateral knee discomfort. Denies diabetes mellitus or stroke type symptoms such lateralizing weakness, aphasia, amaurosis fugax, diplopia, blurred vision, syncope. Other systems negative and complete review of systems.    Objective:   Physical Exam BP 142/79 mmHg  Pulse 60  Temp(Src) 97 F (36.1 C) (Oral)  Resp 16  Ht '5\' 9"'  (1.753 m)  Wt 217 lb (98.431 kg)  BMI  32.03 kg/m2  SpO2 97%    Gen.-alert and oriented x3 in no apparent distress HEENT normal for age Lungs no rhonchi or wheezing Cardiovascular regular rhythm no murmurs carotid pulses 3+ palpable no bruits audible Abdomen soft nontender no palpable masses Musculoskeletal free of  major deformities Skin clear -no rashes Neurologic normal Lower extremities 3+ femoral and dorsalis pedis pulses palpable bilaterally with no edema  Today I ordered a CT angiogram of the abdomen and pelvis which I reviewed by computer and also reviewed the report from the radiologist. Diameter of the sac continues to be about 54 mm essentially unchanged from last study 6 months ago There continues to be a small type II endoleak which appears to communicate between the inferior mesenteric artery and a lumbar branch.       Assessment:     7 months post-abdominal aortic stent graft repair-Gore-C3 with persistent small type II endoleak from IMA-lumbar with stable sac size    Plan:     Patient will return in 6 months with follow-up CT angiogram of abdomen and pelvis and be seen by Dr. Servando Snare continued follow-up Endoleak's were discussed with the patient and his wife and they have an understanding of this and the fact that this will likely not require treatment but does need to be followed

## 2016-03-18 NOTE — Addendum Note (Signed)
Addended by: Mena Goes on: 03/18/2016 04:36 PM   Modules accepted: Orders

## 2016-03-18 NOTE — Telephone Encounter (Signed)
Closed encounter °

## 2016-03-26 ENCOUNTER — Ambulatory Visit (INDEPENDENT_AMBULATORY_CARE_PROVIDER_SITE_OTHER): Payer: Medicare Other | Admitting: Pharmacist Clinician (PhC)/ Clinical Pharmacy Specialist

## 2016-03-26 VITALS — Ht 69.0 in | Wt 215.8 lb

## 2016-03-26 DIAGNOSIS — E785 Hyperlipidemia, unspecified: Secondary | ICD-10-CM

## 2016-03-26 DIAGNOSIS — Z9861 Coronary angioplasty status: Secondary | ICD-10-CM | POA: Diagnosis not present

## 2016-03-26 DIAGNOSIS — I2510 Atherosclerotic heart disease of native coronary artery without angina pectoris: Secondary | ICD-10-CM

## 2016-03-26 MED ORDER — ROSUVASTATIN CALCIUM 5 MG PO TABS: ORAL_TABLET | ORAL | Status: DC

## 2016-03-26 NOTE — Progress Notes (Signed)
03/26/2016 [name] 1936/03/07 [id number]   HPI:  [name] is a 80 y.o. male patient of Dr Ellyn Hack, who presents today for a lipid clinic evaluation.  He is a previous patient of Al Little and was tried on 2 different statin drugs, both of which caused myalgias.  This was about 5-6 years ago since he last took any.  He most recently tried ezetimbie, but had to stop as his copays were close to $300/month.  He currently take just fenofibrate 135 mg.  He apparently tried a PCSK-9 inhibitor with samples about 18 months ago, but never went thru the process to get covered on his insurance.    Current Medications: fenofibrate 135 mg qd  Risk Factors: MI (1985), CABG x 2 (1990), PCI w/stent x 3 (2004), PCI w/stent x 1 (2008), PCI w/stent (2015)  Cholesterol Goals:  LDL <70   Intolerant/previously tried: Lipitor 20 mg daily and Crestor 20 mg daily both caused myalgias   Diet: does eat 2 sausage patties and 2 eggs every morning; drinks 3+ beers each weeknight and 6-10 each weekend day.  Eats some fried foods, although not more than 2-3 times per week  Exercise:  Nothing specific due to back and knee problems.  Does stay active around yard  Labs:  02/2016 -  TC 182, TG 64, HDL 64, LDL 105 01/2015 -   TC 156, TG 64, HDL 44, LDL 99  Current Outpatient Prescriptions  Medication Sig Dispense Refill  . acetaminophen (TYLENOL) 500 MG tablet Take 1,000 mg by mouth every 6 (six) hours as needed for mild pain, moderate pain or headache. Reported on 03/16/2016    . albuterol (PROVENTIL HFA;VENTOLIN HFA) 108 (90 BASE) MCG/ACT inhaler Inhale 2 puffs into the lungs every 6 (six) hours as needed for wheezing or shortness of breath. (Patient not taking: Reported on 03/16/2016) 1 Inhaler 0  . amLODipine (NORVASC) 5 MG tablet TAKE 5 MG BY MOUTH DAILY 90 tablet 3  . Choline Fenofibrate (FENOFIBRIC ACID) 135 MG CPDR TAKE ONE CAPSULE BY MOUTH ONCE DAILY 90 capsule 3  . clopidogrel (PLAVIX) 75 MG tablet TAKE 75  MG BY MOUTH ONCE DAILY 90 tablet 3  . ferrous sulfate 325 (65 FE) MG tablet Take 325 mg by mouth daily with breakfast.    . glucose blood test strip Pt uses one touch verio test strips and tests twice daily. 100 each 2  . losartan (COZAAR) 100 MG tablet Take 1 tablet (100 mg total) by mouth daily. 90 tablet 3  . Multiple Vitamins-Minerals (MULTIVITAMIN WITH MINERALS) tablet Take 1 tablet by mouth daily.     . pantoprazole (PROTONIX) 40 MG tablet TAKE ONE TABLET BY MOUTH ONCE DAILY 90 tablet 3  . pioglitazone-metformin (ACTOPLUS MET) 15-500 MG tablet TAKE ONE TABLET BY MOUTH ONCE DAILY 90 tablet 0  . rosuvastatin (CRESTOR) 5 MG tablet Take 1 tablet by mouth up to 3 times weekly as tolerated 12 tablet 3  . sildenafil (REVATIO) 20 MG tablet TAKE 2-5 TABLETS BY MOUTH DAILY AS NEEDED 50 tablet 5   No current facility-administered medications for this visit.    Allergies  Allergen Reactions  . Lisinopril Cough    Rxn: unknown  . Statins Other (See Comments)    Muscle ache  . Welchol [Colesevelam Hcl] Itching    Past Medical History  Diagnosis Date  . History of: ST elevation myocardial infarction (STEMI) involving left circumflex coronary artery with complication 6314    PTCA-circumflex; PCI in  1991  . CAD, multiple vessel 1985-2010    Most recent cath 01/01/09: 100% Occluded LAD after SP1, patent LIMA-distal LAD with apical 90% lesion.  Cx-OM1 widely patent stent into proximal bifurcating OM1 . Follow-on Cx => 70-80% -- non-amenable PCI. Widely patent 3 overlapping stents in midRCA with only 40% RPL stenosis; SVG- OM known to be occluded.  . S/P CABG x 2 1997    LIMA-LAD, SVG-OM  . H/O unstable angina 06/2003, 12/2006, 05/2014    a) '04: Staged Taxus DES PCI to RCA and Cx-OM;;'08 - PCI to proximal ISR in RCA - Cypher DES; c) 05/2014: ISR in RCA & OM1, PCI with Promus P DES: RCA  2.75 x 12, OM1 3.0 mm x 8)  . History of nuclear stress test December 2013    LOW at Risk. Moderate region of mid to  basal inferolateral scar without ischemia -- consistent with distal circumflex disease. Mild apical hypokinesis with an EF of 47%.  . Irregular heart beat     PVCs and PACs, no arrhythmia recorded  . Exertional dyspnea     Chronic baseline SOB with ambulation  . Diabetes mellitus     Not on oral medication  . Hypertension   . Dyslipidemia, goal LDL below 70   . Osteoarthritis of both knees     And back; multiple back surgeries, right knee arthroplasty and left knee arthroscopic surgery x2  . Chronic back pain      multiple surgeries; C-spine and lumbar  . Erectile dysfunction   . H/O: pneumonia February 14  . Chronic anemia     On iron supplement; history of positive guaiac - negative colonoscopy in 1996.; Thought to be related to hemorrhoids; status post hemorrhoidectomy  . Ankle edema     Chronic  . Diverticulitis of colon 1996  . Choledocholithiasis   . Hemorrhoids   . Diverticulosis   . Adenomatous colon polyp   . Hiatal hernia   . Cholelithiasis - with cholangitis     status post ERCP with removal of calculi and biliary stent placement.  . Sleep apnea     pt. states he was told to return for f/u, to be fitted for Cpap, but pt. reports that he didn't follow up-no cpap used  . AAA (abdominal aortic aneurysm) ([place]) 08/30/12    Doppler 08/30/12 had a small amount of growth. It was a 4.2 x 4.3 cm greater in size than the previous one noted at 4 x 3.8.  05-21-15 followed by CT abd-Dr. Kellie Simmering.  Marland Kitchen GERD (gastroesophageal reflux disease)   . Moderate aortic stenosis by prior echocardiogram October 2016    Normal LV function - EF 55-60%.. Abnormal relaxation. Mild-moderate aortic stenosis (peak/mean gradient 18/10 mmH)    Height '5\' 9"'  (1.753 m), weight 215 lb 12.8 oz (97.886 kg).   ASSESSMENT AND PLAN:  [name] PharmD CPP [name]

## 2016-03-26 NOTE — Assessment & Plan Note (Signed)
With his most current LDL at 105, we discussed several options, including a rechallenge with low-dose statin or a PCSK-9 inhibitor.  He is willing to try rosuvastatin 5 mg weekly x 4-6 weeks then increase to twice weekly as tolerated.  Will repeat labs in September and make any further decisions at that time.

## 2016-03-26 NOTE — Patient Instructions (Signed)
Start rosuvastatin 5 mg once weekly.  If after 6 weeks you are feeling fine, try increasing dose to 5 mg twice weekly.    Repeat cholesterol labs in 10-12 weeks  (mid-September) and come in for review after that  Cholesterol Cholesterol is a fat. Your body needs a small amount of cholesterol. Cholesterol may build up in your blood vessels. This increases your chance of having a heart attack or stroke. You cannot feel your cholesterol levels. The only way to know your cholesterol level is high is with a blood test. Keep your test results. Work with your doctor to keep your cholesterol at a good level. WHAT DO THE TEST RESULTS MEAN?  Total cholesterol is how much cholesterol is in your blood.  LDL is bad cholesterol. This is the type that can build up. You want LDL to be low.  HDL is good cholesterol. It cleans your blood vessels and carries LDL away. You want HDL to be high.  Triglycerides are fat that the body can burn for energy or store. WHAT ARE GOOD LEVELS OF CHOLESTEROL?  Total cholesterol below 200.  LDL below 100 for people at risk. Below 70 for those at very high risk.  HDL above 50 is good. Above 60 is best.  Triglycerides below 150. HOW CAN I LOWER MY CHOLESTEROL?  Diet. Follow your diet programs as told by your doctor.  Choose fish, white meat chicken, roasted [place], or baked [place]. Try not to eat red meat, fried foods, or processed meats such as sausage and lunch meats.  Eat lots of fresh fruits and vegetables.  Choose whole grains, beans, pasta, potatoes, and cereals.  Use only small amounts of olive, corn, or canola oils.  Try not to eat butter, mayonnaise, shortening, or palm kernel oils.  Try not to eat foods with trans fats.  Drink skim or nonfat milk. Eat low-fat or nonfat yogurt and cheeses. Try not to drink whole milk or cream. Try not to eat ice cream, egg yolks, and full-fat cheeses.  Healthy desserts include angel food cake, ginger snaps, animal  crackers, hard candy, popsicles, and low-fat or nonfat frozen yogurt. Try not to eat pastries, cakes, pies, and cookies.  Exercise. Follow your exercise programs as told by your doctor.  Be more active. You can try gardening, walking, or taking the stairs. Ask your doctor about how you can be more active.  Medicine. Take medicine as told by your doctor.   This information is not intended to replace advice given to you by your health care provider. Make sure you discuss any questions you have with your health care provider.   Document Released: 12/17/2008 Document Revised: 10/11/2014 Document Reviewed: 07/04/2013 Elsevier Interactive Patient Education [employer].

## 2016-04-04 NOTE — Telephone Encounter (Signed)
Pt was switched to rosuvastatin 5 mg weekly x 4-6 weeks then increase to twice weekly as tolerated per Heart doctor

## 2016-04-13 DIAGNOSIS — H40013 Open angle with borderline findings, low risk, bilateral: Secondary | ICD-10-CM | POA: Diagnosis not present

## 2016-04-13 DIAGNOSIS — H353122 Nonexudative age-related macular degeneration, left eye, intermediate dry stage: Secondary | ICD-10-CM | POA: Diagnosis not present

## 2016-04-13 DIAGNOSIS — H353112 Nonexudative age-related macular degeneration, right eye, intermediate dry stage: Secondary | ICD-10-CM | POA: Diagnosis not present

## 2016-04-13 DIAGNOSIS — H35033 Hypertensive retinopathy, bilateral: Secondary | ICD-10-CM | POA: Diagnosis not present

## 2016-04-13 DIAGNOSIS — E119 Type 2 diabetes mellitus without complications: Secondary | ICD-10-CM | POA: Diagnosis not present

## 2016-05-20 ENCOUNTER — Other Ambulatory Visit: Payer: Self-pay | Admitting: Family Medicine

## 2016-06-10 ENCOUNTER — Other Ambulatory Visit: Payer: Self-pay | Admitting: Cardiology

## 2016-06-10 DIAGNOSIS — E785 Hyperlipidemia, unspecified: Secondary | ICD-10-CM | POA: Diagnosis not present

## 2016-06-10 LAB — HEPATIC FUNCTION PANEL
ALBUMIN: 3.9 g/dL (ref 3.6–5.1)
ALK PHOS: 32 U/L — AB (ref 40–115)
ALT: 15 U/L (ref 9–46)
AST: 20 U/L (ref 10–35)
BILIRUBIN TOTAL: 0.6 mg/dL (ref 0.2–1.2)
Bilirubin, Direct: 0.1 mg/dL (ref <=0.2)
Indirect Bilirubin: 0.5 mg/dL (ref 0.2–1.2)
Total Protein: 6.3 g/dL (ref 6.1–8.1)

## 2016-06-10 LAB — LIPID PANEL
CHOLESTEROL: 177 mg/dL (ref 125–200)
HDL: 58 mg/dL (ref >=40)
LDL Cholesterol: 104 mg/dL (ref <130)
Total CHOL/HDL Ratio: 3.1 Ratio (ref <=5.0)
Triglycerides: 73 mg/dL (ref <150)
VLDL: 15 mg/dL (ref <30)

## 2016-06-24 ENCOUNTER — Encounter: Payer: Self-pay | Admitting: Pharmacist Clinician (PhC)/ Clinical Pharmacy Specialist

## 2016-06-24 ENCOUNTER — Ambulatory Visit (INDEPENDENT_AMBULATORY_CARE_PROVIDER_SITE_OTHER): Payer: Medicare Other | Admitting: Pharmacist Clinician (PhC)/ Clinical Pharmacy Specialist

## 2016-06-24 DIAGNOSIS — Z9861 Coronary angioplasty status: Secondary | ICD-10-CM

## 2016-06-24 DIAGNOSIS — I2510 Atherosclerotic heart disease of native coronary artery without angina pectoris: Secondary | ICD-10-CM

## 2016-06-24 DIAGNOSIS — E785 Hyperlipidemia, unspecified: Secondary | ICD-10-CM

## 2016-06-24 NOTE — Assessment & Plan Note (Signed)
Cholesterol unchanged, but took last dose of rosuvastatin a few weeks prior to labs.  He is not terribly interested in pursuing medication options today. He would prefer to try the neighbor's red wind theory.  He is willing to repeat labs in early January and consider another trial of ezetimibe at that time.  Will send him a reminder in December for that.

## 2016-06-24 NOTE — Progress Notes (Signed)
06/24/2016 [name] 06/29/1936 [id number]   HPI:  [name] is a 80 y.o. male patient of [name], who presents today for a lipid clinic evaluation.  He is a previous patient of [name] and was tried on 2 different statin drugs, both of which caused myalgias.  This was about 5-6 years ago since he last took any.  He most recently tried ezetimbie, but had to stop as his copays were close to $300/month.  He currently take just fenofibrate 135 mg.  He apparently tried a PCSK-9 inhibitor with samples about 18 months ago, but never went thru the process to get covered on his insurance.    At his last visit he agreed to rosuvastatin 5 mg once weekly, but was unable to tolerate even this due to leg pains.  We discussed the option of getting ezetimibe at [name], where he buys his sildenafil, and he is not completely opposed to that.  He states that his neighbor lowered her cholesterol just by drinking 4oz of red wine daily.  He would prefer to try that route, and if labs in January don't look better, he would consider the ezetmbie again   Current Medications: fenofibrate 135 mg qd  Risk Factors: MI (1985), CABG x 2 (1990), PCI w/stent x 3 (2004), PCI w/stent x 1 (2008), PCI w/stent (2015)  Cholesterol Goals:  LDL <70   Intolerant/previously tried: Lipitor 20 mg daily and Crestor 20 mg daily both caused myalgias; Crestor 5 mg weekly also caused myalgias  Diet: does eat 2 sausage patties and 2 eggs several morning per week; drinks 3+ beers each weeknight and 6-10 each weekend day.  Eats some fried foods, although not more than 2-3 times per week  Exercise:  Nothing specific due to back and knee problems.  Does stay active around yard  Labs:  06/2016 - TC 177, TG 73, HDL 58, LDL 104 02/2016 -  TC 182, TG 64, HDL 64, LDL 105 01/2015 -   TC 156, TG 64, HDL 44, LDL 99  Current Outpatient Prescriptions  Medication Sig Dispense Refill  . acetaminophen (TYLENOL) 500 MG tablet Take 1,000  mg by mouth every 6 (six) hours as needed for mild pain, moderate pain or headache. Reported on 03/16/2016    . albuterol (PROVENTIL HFA;VENTOLIN HFA) 108 (90 BASE) MCG/ACT inhaler Inhale 2 puffs into the lungs every 6 (six) hours as needed for wheezing or shortness of breath. (Patient not taking: Reported on 03/16/2016) 1 Inhaler 0  . amLODipine (NORVASC) 5 MG tablet TAKE 5 MG BY MOUTH DAILY 90 tablet 3  . Choline Fenofibrate (FENOFIBRIC ACID) 135 MG CPDR TAKE ONE CAPSULE BY MOUTH ONCE DAILY 90 capsule 3  . clopidogrel (PLAVIX) 75 MG tablet TAKE 75 MG BY MOUTH ONCE DAILY 90 tablet 3  . ferrous sulfate 325 (65 FE) MG tablet Take 325 mg by mouth daily with breakfast.    . glucose blood test strip Pt uses one touch verio test strips and tests twice daily. 100 each 2  . losartan (COZAAR) 100 MG tablet Take 1 tablet (100 mg total) by mouth daily. 90 tablet 3  . Multiple Vitamins-Minerals (MULTIVITAMIN WITH MINERALS) tablet Take 1 tablet by mouth daily.     . pantoprazole (PROTONIX) 40 MG tablet TAKE ONE TABLET BY MOUTH ONCE DAILY 90 tablet 3  . pioglitazone-metformin (ACTOPLUS MET) 15-500 MG tablet TAKE ONE TABLET BY MOUTH ONCE DAILY 90 tablet 0  . pioglitazone-metformin (ACTOPLUS MET) 15-500 MG tablet   TAKE ONE TABLET BY MOUTH ONCE DAILY 90 tablet 0  . sildenafil (REVATIO) 20 MG tablet TAKE 2-5 TABLETS BY MOUTH DAILY AS NEEDED 50 tablet 5   No current facility-administered medications for this visit.     Allergies  Allergen Reactions  . Lisinopril Cough    Rxn: unknown  . Statins Other (See Comments)    Muscle ache  . Welchol [Colesevelam Hcl] Itching    Past Medical History:  Diagnosis Date  . AAA (abdominal aortic aneurysm) ([facility]) 08/30/12   Doppler 08/30/12 had a small amount of growth. It was a 4.2 x 4.3 cm greater in size than the previous one noted at 4 x 3.8.  05-21-15 followed by CT abd-Julian. Lawson.  . Adenomatous colon polyp   . Ankle edema    Chronic  . CAD, multiple vessel  1985-2010   Most recent cath 01/01/09: 100% Occluded LAD after SP1, patent LIMA-distal LAD with apical 90% lesion.  Cx-OM1 widely patent stent into proximal bifurcating OM1 . Follow-on Cx => 70-80% -- non-amenable PCI. Widely patent 3 overlapping stents in midRCA with only 40% RPL stenosis; SVG- OM known to be occluded.  . Choledocholithiasis   . Cholelithiasis - with cholangitis    status post ERCP with removal of calculi and biliary stent placement.  . Chronic anemia    On iron supplement; history of positive guaiac - negative colonoscopy in 1996.; Thought to be related to hemorrhoids; status post hemorrhoidectomy  . Chronic back pain     multiple surgeries; C-spine and lumbar  . Diabetes mellitus    Not on oral medication  . Diverticulitis of colon 1996  . Diverticulosis   . Dyslipidemia, goal LDL below 70   . Erectile dysfunction   . Exertional dyspnea    Chronic baseline SOB with ambulation  . GERD (gastroesophageal reflux disease)   . H/O unstable angina 06/2003, 12/2006, 05/2014   a) '04: Staged Taxus DES PCI to RCA and Cx-OM;;'08 - PCI to proximal ISR in RCA - Cypher DES; c) 05/2014: ISR in RCA & OM1, PCI with Promus P DES: RCA  2.75 x 12, OM1 3.0 mm x 8)  . H/O: pneumonia February 14  . Hemorrhoids   . Hiatal hernia   . History of nuclear stress test December 2013   LOW at Risk. Moderate region of mid to basal inferolateral scar without ischemia -- consistent with distal circumflex disease. Mild apical hypokinesis with an EF of 47%.  . History of: ST elevation myocardial infarction (STEMI) involving left circumflex coronary artery with complication 1985   PTCA-circumflex; PCI in 1991  . Hypertension   . Irregular heart beat    PVCs and PACs, no arrhythmia recorded  . Moderate aortic stenosis by prior echocardiogram October 2016   Normal LV function - EF 55-60%.. Abnormal relaxation. Mild-moderate aortic stenosis (peak/mean gradient 18/10 mmH)  . Osteoarthritis of both knees     And back; multiple back surgeries, right knee arthroplasty and left knee arthroscopic surgery x2  . S/P CABG x 2 1997   LIMA-LAD, SVG-OM  . Sleep apnea    pt. states he was told to return for f/u, to be fitted for Cpap, but pt. reports that he didn't follow up-no cpap used    There were no vitals taken for this visit.   ASSESSMENT AND PLAN: Cholesterol unchanged, but took last dose of rosuvastatin a few weeks prior to labs.  He is not terribly interested in pursuing medication options today. He would prefer   to try the neighbor's red wind theory.  He is willing to repeat labs in early January and consider another trial of ezetimibe at that time.  Will send him a reminder in December for that.      PharmD CPP [facility]     

## 2016-06-24 NOTE — Patient Instructions (Signed)
Will repeat labs in early January, at that time we can decide if we need to re-try Zetia or look toward the injectable cholesterol medications.   Cholesterol Cholesterol is a fat. Your body needs a small amount of cholesterol. Cholesterol may build up in your blood vessels. This increases your chance of having a heart attack or stroke. You cannot feel your cholesterol levels. The only way to know your cholesterol level is high is with a blood test. Keep your test results. Work with your doctor to keep your cholesterol at a good level. WHAT DO THE TEST RESULTS MEAN?  Total cholesterol is how much cholesterol is in your blood.  LDL is bad cholesterol. This is the type that can build up. You want LDL to be low.  HDL is good cholesterol. It cleans your blood vessels and carries LDL away. You want HDL to be high.  Triglycerides are fat that the body can burn for energy or store. WHAT ARE GOOD LEVELS OF CHOLESTEROL?  Total cholesterol below 200.  LDL below 100 for people at risk. Below 70 for those at very high risk.  HDL above 50 is good. Above 60 is best.  Triglycerides below 150. HOW CAN I LOWER MY CHOLESTEROL?  Diet. Follow your diet programs as told by your doctor.  Choose fish, white meat chicken, roasted [place], or baked [place]. Try not to eat red meat, fried foods, or processed meats such as sausage and lunch meats.  Eat lots of fresh fruits and vegetables.  Choose whole grains, beans, pasta, potatoes, and cereals.  Use only small amounts of olive, corn, or canola oils.  Try not to eat butter, mayonnaise, shortening, or palm kernel oils.  Try not to eat foods with trans fats.  Drink skim or nonfat milk. Eat low-fat or nonfat yogurt and cheeses. Try not to drink whole milk or cream. Try not to eat ice cream, egg yolks, and full-fat cheeses.  Healthy desserts include angel food cake, ginger snaps, animal crackers, hard candy, popsicles, and low-fat or nonfat frozen yogurt.  Try not to eat pastries, cakes, pies, and cookies.  Exercise. Follow your exercise programs as told by your doctor.  Be more active. You can try gardening, walking, or taking the stairs. Ask your doctor about how you can be more active.  Medicine. Take medicine as told by your doctor.   This information is not intended to replace advice given to you by your health care provider. Make sure you discuss any questions you have with your health care provider.   Document Released: 12/17/2008 Document Revised: 10/11/2014 Document Reviewed: 07/04/2013 Elsevier Interactive Patient Education [employer].

## 2016-07-04 HISTORY — PX: TRANSTHORACIC ECHOCARDIOGRAM: SHX275

## 2016-07-19 ENCOUNTER — Other Ambulatory Visit: Payer: Self-pay

## 2016-07-19 ENCOUNTER — Ambulatory Visit (HOSPITAL_COMMUNITY): Payer: Medicare Other | Attending: Cardiology

## 2016-07-19 DIAGNOSIS — I5032 Chronic diastolic (congestive) heart failure: Secondary | ICD-10-CM | POA: Diagnosis not present

## 2016-07-19 DIAGNOSIS — E1159 Type 2 diabetes mellitus with other circulatory complications: Secondary | ICD-10-CM | POA: Insufficient documentation

## 2016-07-19 DIAGNOSIS — I10 Essential (primary) hypertension: Secondary | ICD-10-CM | POA: Insufficient documentation

## 2016-07-19 DIAGNOSIS — Z955 Presence of coronary angioplasty implant and graft: Secondary | ICD-10-CM

## 2016-07-19 DIAGNOSIS — Z789 Other specified health status: Secondary | ICD-10-CM | POA: Diagnosis not present

## 2016-07-19 DIAGNOSIS — I2510 Atherosclerotic heart disease of native coronary artery without angina pectoris: Secondary | ICD-10-CM

## 2016-07-19 DIAGNOSIS — E669 Obesity, unspecified: Secondary | ICD-10-CM | POA: Diagnosis not present

## 2016-07-19 DIAGNOSIS — I2119 ST elevation (STEMI) myocardial infarction involving other coronary artery of inferior wall: Secondary | ICD-10-CM | POA: Diagnosis not present

## 2016-07-19 DIAGNOSIS — T82855D Stenosis of coronary artery stent, subsequent encounter: Secondary | ICD-10-CM

## 2016-07-19 DIAGNOSIS — E785 Hyperlipidemia, unspecified: Secondary | ICD-10-CM

## 2016-07-19 DIAGNOSIS — I700 Atherosclerosis of aorta: Secondary | ICD-10-CM | POA: Insufficient documentation

## 2016-07-19 DIAGNOSIS — Z9861 Coronary angioplasty status: Secondary | ICD-10-CM

## 2016-07-19 DIAGNOSIS — I350 Nonrheumatic aortic (valve) stenosis: Secondary | ICD-10-CM | POA: Diagnosis not present

## 2016-07-19 DIAGNOSIS — I152 Hypertension secondary to endocrine disorders: Secondary | ICD-10-CM

## 2016-07-23 ENCOUNTER — Ambulatory Visit (INDEPENDENT_AMBULATORY_CARE_PROVIDER_SITE_OTHER): Payer: Medicare Other | Admitting: Family Medicine

## 2016-07-23 ENCOUNTER — Encounter: Payer: Self-pay | Admitting: Family Medicine

## 2016-07-23 VITALS — BP 114/70 | HR 64 | Temp 97.8°F | Wt 219.0 lb

## 2016-07-23 DIAGNOSIS — Z9861 Coronary angioplasty status: Secondary | ICD-10-CM | POA: Diagnosis not present

## 2016-07-23 DIAGNOSIS — I2510 Atherosclerotic heart disease of native coronary artery without angina pectoris: Secondary | ICD-10-CM | POA: Diagnosis not present

## 2016-07-23 DIAGNOSIS — H1031 Unspecified acute conjunctivitis, right eye: Secondary | ICD-10-CM

## 2016-07-23 DIAGNOSIS — J029 Acute pharyngitis, unspecified: Secondary | ICD-10-CM

## 2016-07-23 MED ORDER — AMOXICILLIN 875 MG PO TABS: 875.0000 mg | ORAL_TABLET | Freq: Two times a day (BID) | ORAL | 0 refills | Status: DC

## 2016-07-23 NOTE — Progress Notes (Signed)
   Subjective:    Patient ID: [name], male    DOB: 04/05/1936, 80 y.o.   MRN: [id number]  HPI He has a three-day history of started with sore throat followed by a dry cough and redness and drainage in the right eye with some PND, sinus congestion but no earache, fever or chills.   Review of Systems     Objective:   Physical Exam Alert and in no distress. Right conjunctiva is injected with purulent drainage noted. No sinus tenderness noted. Tympanic membranes and canals are normal. Pharyngeal area is normal. Neck is supple without adenopathy or thyromegaly. Cardiac exam shows a regular sinus rhythm without murmurs or gallops. Lungs are clear to auscultation.        Assessment & Plan:  Acute conjunctivitis of right eye, unspecified acute conjunctivitis type - Plan: amoxicillin (AMOXIL) 875 MG tablet  Sore throat - Plan: amoxicillin (AMOXIL) 875 MG tablet Will call if no improvement.

## 2016-08-19 ENCOUNTER — Other Ambulatory Visit: Payer: Self-pay | Admitting: Family Medicine

## 2016-08-24 DIAGNOSIS — H40013 Open angle with borderline findings, low risk, bilateral: Secondary | ICD-10-CM | POA: Diagnosis not present

## 2016-09-06 ENCOUNTER — Telehealth: Payer: Self-pay | Admitting: Cardiology

## 2016-09-06 ENCOUNTER — Other Ambulatory Visit: Payer: Self-pay

## 2016-09-06 MED ORDER — AMLODIPINE BESYLATE 5 MG PO TABS: ORAL_TABLET | ORAL | 3 refills | Status: DC

## 2016-09-06 NOTE — Telephone Encounter (Signed)
New Message   *STAT* If patient is at the pharmacy, call can be transferred to refill team.   1. Which medications need to be refilled? (please list name of each medication and dose if known) Amlodipine (Norvasc) 5 mg tablet by mouth daily  2. Which pharmacy/location (including street and city if local pharmacy) is medication to be sent to? [facility] - [place] ([place]), [place] - [address]  3. Do they need a 30 day or 90 day supply? 90 day

## 2016-09-07 MED ORDER — AMLODIPINE BESYLATE 5 MG PO TABS: ORAL_TABLET | ORAL | 0 refills | Status: DC

## 2016-09-07 NOTE — Telephone Encounter (Signed)
Rx has been sent to the pharmacy electronically. ° °

## 2016-09-10 ENCOUNTER — Other Ambulatory Visit: Payer: Self-pay

## 2016-10-07 ENCOUNTER — Ambulatory Visit
Admission: RE | Admit: 2016-10-07 | Discharge: 2016-10-07 | Disposition: A | Discharge status: Home / Self Care | Payer: Medicare Other | Source: Ambulatory Visit | Attending: Vascular Surgery | Admitting: Vascular Surgery

## 2016-10-07 DIAGNOSIS — IMO0001 Reserved for inherently not codable concepts without codable children: Secondary | ICD-10-CM

## 2016-10-07 DIAGNOSIS — I714 Abdominal aortic aneurysm, without rupture: Secondary | ICD-10-CM | POA: Diagnosis not present

## 2016-10-07 DIAGNOSIS — T82330D Leakage of aortic (bifurcation) graft (replacement), subsequent encounter: Secondary | ICD-10-CM

## 2016-10-07 MED ORDER — IOPAMIDOL (ISOVUE-370) INJECTION 76%: 75.0000 mL | Freq: Once | INTRAVENOUS | Status: DC | PRN

## 2016-10-11 ENCOUNTER — Encounter: Payer: Self-pay | Admitting: Vascular Surgery

## 2016-10-15 ENCOUNTER — Ambulatory Visit (INDEPENDENT_AMBULATORY_CARE_PROVIDER_SITE_OTHER): Payer: Medicare Other | Admitting: Vascular Surgery

## 2016-10-15 ENCOUNTER — Encounter: Payer: Self-pay | Admitting: Vascular Surgery

## 2016-10-15 VITALS — BP 151/84 | HR 68 | Temp 97.8°F | Resp 20 | Ht 69.0 in | Wt 219.0 lb

## 2016-10-15 DIAGNOSIS — I714 Abdominal aortic aneurysm, without rupture, unspecified: Secondary | ICD-10-CM

## 2016-10-15 NOTE — Progress Notes (Signed)
Patient ID: [name], male   DOB: 1935-12-23, 81 y.o.   MRN: [id number]  Reason for Consult: Re-evaluation (6 month f/u - s/p EVAR - CTA abd/pelvis prior)   Referred by Denita Lung, MD  Subjective:     HPI:  [name] is a 81 y.o. male with previous Marylouise Stacks performed in November 2016 by Dr. Kellie Simmering. 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 He continues to walk without issue although he does have some swelling of his bilateral lower extremities. He does have a significant heart history had a had multiple stents. He is diabetic he currently takes Plavix but no other blood thinners.  Past Medical History:  Diagnosis Date  . AAA (abdominal aortic aneurysm) ([place]) 08/30/12   Doppler 08/30/12 had a small amount of growth. It was a 4.2 x 4.3 cm greater in size than the previous one noted at 4 x 3.8.  05-21-15 followed by CT abd-Dr. Kellie Simmering.  . Adenomatous colon polyp   . Ankle edema    Chronic  . CAD, multiple vessel 1985-2010   Most recent cath 01/01/09: 100% Occluded LAD after SP1, patent LIMA-distal LAD with apical 90% lesion.  Cx-OM1 widely patent stent into proximal bifurcating OM1 . Follow-on Cx => 70-80% -- non-amenable PCI. Widely patent 3 overlapping stents in midRCA with only 40% RPL stenosis; SVG- OM known to be occluded.  . Choledocholithiasis   . Cholelithiasis - with cholangitis    status post ERCP with removal of calculi and biliary stent placement.  . Chronic anemia    On iron supplement; history of positive guaiac - negative colonoscopy in 1996.; Thought to be related to hemorrhoids; status post hemorrhoidectomy  . Chronic back pain     multiple surgeries; C-spine and lumbar  . Diabetes mellitus    Not on oral medication  . Diverticulitis of colon 1996  . Diverticulosis   . Dyslipidemia, goal LDL below 70   . Erectile dysfunction   . Exertional dyspnea    Chronic baseline  SOB with ambulation  . GERD (gastroesophageal reflux disease)   . H/O unstable angina 06/2003, 12/2006, 05/2014   a) '04: Staged Taxus DES PCI to RCA and Cx-OM;;'08 - PCI to proximal ISR in RCA - Cypher DES; c) 05/2014: ISR in RCA & OM1, PCI with Promus P DES: RCA  2.75 x 12, OM1 3.0 mm x 8)  . H/O: pneumonia February 14  . Hemorrhoids   . Hiatal hernia   . History of nuclear stress test December 2013   LOW at Risk. Moderate region of mid to basal inferolateral scar without ischemia -- consistent with distal circumflex disease. Mild apical hypokinesis with an EF of 47%.  Marland Kitchen History of: ST elevation myocardial infarction (STEMI) involving left circumflex coronary artery with complication 9390   PTCA-circumflex; PCI in 1991  . Hypertension   . Irregular heart beat    PVCs and PACs, no arrhythmia recorded  . Moderate aortic stenosis by prior echocardiogram October 2016   Normal LV function - EF 55-60%.. Abnormal relaxation. Mild-moderate aortic stenosis (peak/mean gradient 18/10 mmH)  . Osteoarthritis of both knees    And back; multiple back surgeries, right knee arthroplasty and left knee arthroscopic surgery x2  . S/P CABG x 2 1997   LIMA-LAD, SVG-OM  . Sleep apnea    pt. states he was told to return for f/u, to be fitted  for Cpap, but pt. reports that he didn't follow up-no cpap used   Family History  Problem Relation Age of Onset  . Arthritis Mother   . Diabetes Father   . Heart disease Father   . Stroke Sister   . Hypertension Sister   . Heart disease Sister   . Diabetes Sister   . Breast cancer Sister   . Heart disease Brother   . Ulcers Brother   . Colon cancer Neg Hx    Past Surgical History:  Procedure Laterality Date  . Abdominal and Lower Extremity Arterial Ultrasound  08/23/2012; 10/10/2013   Normal ABIs. Nonocclusive lower extremity disease. 4.2 cm x 4.3 cm infrarenal AAA;; 4.4 cm x 4.3 cm (essentially stable)   . ABDOMINAL AORTIC ENDOVASCULAR STENT GRAFT N/A 08/13/2015     Procedure: ABDOMINAL AORTIC ENDOVASCULAR STENT GRAFT;  Surgeon: Mal Misty, MD;  Location: [place] [facility];  Service: Vascular;  Laterality: N/A;  . Anterior cervical plating  04/23/10   At C4-5 and a C6-7 utilizing two separate Biomet MaxAn plates.  . ANTERIOR LAT LUMBAR FUSION Left 11/22/2012   Procedure: ANTERIOR LATERAL LUMBAR FUSION 1 LEVEL;  Surgeon: Eustace Moore, MD;  Location: [facility];  Service: Neurosurgery;  Laterality: Left;  Anterior Lateral Lumbar Fusion Lumbar Three-Four  . BACK SURGERY  1979 & x 10   pt. remarks, "I have had about 10 back surgeries"  . BILIARY STENT PLACEMENT N/A 07/03/2014   Procedure: BILIARY STENT PLACEMENT;  Surgeon: Inda Castle, MD;  Location: [place];  Service: Endoscopy;  Laterality: N/A;  . CARDIAC CATHETERIZATION  September 2004   None Occluded vein graft to OM; diffuse RCA disease in the mid vessel, 80% circumflex-OM stenosis; follow on AV groove circumflex with sequential 90% stenoses and intervening saccular dilation   . CARDIAC CATHETERIZATION  March 2010   4 abnormal Myoview showing apical thinning (possibly due to apical LAD 95%) : 100% Occluded LAD after SV1, distal LAD grafted via LIMA -apical 95% . Cx -OM1 w/patent stent extending into OM 1 . Follow on Cx - 70-80% - non-amenable PCI. RCA widely patent 3 overlapping stents in mRCA w/less than 40% stenosisin RPL; SVG-OM known occluded   . CATARACT EXTRACTION    . Cervical arthrodesis  04/23/10   Anterior cervical arthrodesis, C4-5, C6-7 utilizing 7-mm PEEK interbody cage packed with local autograft & Antifuse putty at C4-5 & an 8-mm cage at C6-7.  Marland Kitchen CERVICAL DISCECTOMY  04/23/10   Decompressive anterior carvical diskectomy. C4-5, C6-7  . CHOLECYSTECTOMY N/A 05/23/2015   Procedure: LAPAROSCOPIC CHOLECYSTECTOMY WITH INTRAOPERATIVE CHOLANGIOGRAM;  Surgeon: Alphonsa Overall, MD;  Location: [facility];  Service: General;  Laterality: N/A;  . [place]  . CORONARY ANGIOPLASTY  [id number]   1985  lateral STEMI Circumflex PTCA  . CORONARY ARTERY BYPASS GRAFT  1990   LIMA-LAD, SVG-OM  . ERCP N/A 07/03/2014   Procedure: ENDOSCOPIC RETROGRADE CHOLANGIOPANCREATOGRAPHY (ERCP);  Surgeon: Inda Castle, MD;  Location: [facility];  Service: Endoscopy;  Laterality: N/A;  . ERCP N/A 07/05/2014   Procedure: ENDOSCOPIC RETROGRADE CHOLANGIOPANCREATOGRAPHY (ERCP);  Surgeon: Inda Castle, MD;  Location: [facility];  Service: Endoscopy;  Laterality: N/A;  . ERCP N/A 06/30/2015   Procedure: ENDOSCOPIC RETROGRADE CHOLANGIOPANCREATOGRAPHY (ERCP);  Surgeon: Ladene Artist, MD;  Location: [facility];  Service: Endoscopy;  Laterality: N/A;  . KNEE ARTHROSCOPY Left    x 2  . LEFT HEART CATHETERIZATION WITH CORONARY ANGIOGRAM N/A 05/15/2014   Procedure: LEFT HEART  CATHETERIZATION WITH CORONARY ANGIOGRAM;  Surgeon: Sinclair Grooms, MD;  Location: [facility] LAB;  Service: Cardiovascular;  Laterality: N/A;  . LEFT HEART CATHETERIZATION WITH CORONARY/GRAFT ANGIOGRAM N/A 06/28/2014   Procedure: LEFT HEART CATHETERIZATION WITH Beatrix Fetters;  Surgeon: Troy Sine, MD;  Location: [facility] CATH LAB;  Service: Cardiovascular;  Laterality: N/A;  . LUMBAR PERCUTANEOUS PEDICLE SCREW 1 LEVEL N/A 11/22/2012   Procedure: LUMBAR PERCUTANEOUS PEDICLE SCREW 1 LEVEL;  Surgeon: Eustace Moore, MD;  Location: [place] [facility];  Service: Neurosurgery;  Laterality: N/A;  Lumbar Three-Four Percutaneous Pedicle Screw, Lateral approach  . MINOR HEMORRHOIDECTOMY    . NM MYOVIEW LTD  December 2013   LOW RISK. Mmoderate region of mid to basal inferolateral scar without ischemia. Mild apical hypokinesis with an EF of 47%.  Marland Kitchen PERCUTANEOUS CORONARY STENT INTERVENTION (PCI-S)  September 2004   PCI - RCA 2 overlapping Taxus DES 2.75 mm x 32 mm and 2.75 mm x 12 mm (3.0 mm); PCI-Cx-OM1 - Taxus DES 3.0 mm x 20 mm (3.1 mm);   Marland Kitchen PERCUTANEOUS CORONARY STENT INTERVENTION (PCI-S)  March 2008   80% ISR in proximal Taxus stent in RCA -- covered  proximally with Cypher DES 3.0 mm x 12 mm  . PERCUTANEOUS CORONARY STENT INTERVENTION (PCI-S) N/A 05/17/2014   Procedure: PERCUTANEOUS CORONARY STENT INTERVENTION (PCI-S);  Surgeon: Sinclair Grooms, MD;  Location: [facility] CATH LAB;  Service: Cardiovascular;  Laterality: N/A;  . POSTERIOR CERVICAL FUSION/FORAMINOTOMY N/A 05/02/2013   Procedure: POSTERIOR CERVICAL FUSION/FORAMINOTOMY CERVICAL SEVEN THORACIC-ONE;  Surgeon: Eustace Moore, MD;  Location: [place] [facility];  Service: Neurosurgery;  Laterality: N/A;  POSTERIOR CERVICAL FUSION/FORAMINOTOMY CERVICAL SEVEN THORACIC-ONE  . SPHINCTEROTOMY N/A 06/30/2015   Procedure: SPHINCTEROTOMY;  Surgeon: Ladene Artist, MD;  Location: [facility];  Service: Endoscopy;  Laterality: N/A;  . TOTAL KNEE ARTHROPLASTY Right   . TRANSTHORACIC ECHOCARDIOGRAM  December 2013; October 2016   a. EF 55-60%. mod LA dilation. Aortic Sclerosis;; b. EF 55-60%, Gr 1 DD, Mild-Mod AS (Peark - Mean Gradient 18-10 mmHg)    Short Social History:  Social History  Substance Use Topics  . Smoking status: Former Smoker    Types: Cigarettes    Quit date: 10/05/1983  . Smokeless tobacco: Never Used  . Alcohol use 1.8 oz/week    3 Cans of beer per week     Comment: 3 or more cans of beer per day - 05/21/15    Allergies  Allergen Reactions  . Lisinopril Cough    Rxn: unknown  . Statins Other (See Comments)    Muscle ache  . Welchol [Colesevelam Hcl] Itching    Current Outpatient Prescriptions  Medication Sig Dispense Refill  . acetaminophen (TYLENOL) 500 MG tablet Take 1,000 mg by mouth every 6 (six) hours as needed for mild pain, moderate pain or headache. Reported on 03/16/2016    . albuterol (PROVENTIL HFA;VENTOLIN HFA) 108 (90 BASE) MCG/ACT inhaler Inhale 2 puffs into the lungs every 6 (six) hours as needed for wheezing or shortness of breath. 1 Inhaler 0  . amLODipine (NORVASC) 5 MG tablet TAKE 5 MG BY MOUTH DAILY....need appointment before further refills 90 tablet 0  .  Choline Fenofibrate (FENOFIBRIC ACID) 135 MG CPDR TAKE ONE CAPSULE BY MOUTH ONCE DAILY 90 capsule 3  . clopidogrel (PLAVIX) 75 MG tablet TAKE 75 MG BY MOUTH ONCE DAILY 90 tablet 3  . ferrous sulfate 325 (65 FE) MG tablet Take 325 mg by mouth daily with breakfast.    .  glucose blood test strip Pt uses one touch verio test strips and tests twice daily. 100 each 2  . losartan (COZAAR) 100 MG tablet Take 1 tablet (100 mg total) by mouth daily. 90 tablet 3  . Multiple Vitamins-Minerals (MULTIVITAMIN WITH MINERALS) tablet Take 1 tablet by mouth daily.     . pantoprazole (PROTONIX) 40 MG tablet TAKE ONE TABLET BY MOUTH ONCE DAILY 90 tablet 3  . pioglitazone-metformin (ACTOPLUS MET) 15-500 MG tablet TAKE ONE TABLET BY MOUTH ONCE DAILY 90 tablet 0  . sildenafil (REVATIO) 20 MG tablet TAKE 2-5 TABLETS BY MOUTH DAILY AS NEEDED 50 tablet 5  . amoxicillin (AMOXIL) 875 MG tablet Take 1 tablet (875 mg total) by mouth 2 (two) times daily. (Patient not taking: Reported on 10/15/2016) 20 tablet 0  . pioglitazone-metformin (ACTOPLUS MET) 15-500 MG tablet TAKE ONE TABLET BY MOUTH ONCE DAILY (Patient not taking: Reported on 10/15/2016) 90 tablet 0   No current facility-administered medications for this visit.     Review of Systems  Constitutional:  Constitutional negative. HENT: HENT negative.  Eyes: Eyes negative.  Respiratory: Respiratory negative.  Cardiovascular: Positive for leg swelling.  GI: Gastrointestinal negative.  Musculoskeletal: Musculoskeletal negative.  Skin: Skin negative.  Neurological: Neurological negative. Hematologic: Hematologic/lymphatic negative.  Psychiatric: Psychiatric negative.        Objective:  Objective   Vitals:   10/15/16 0958 10/15/16 1001  BP: (!) 153/4 (!) 151/84  Pulse: 68   Resp: 20   Temp: 97.8 F (36.6 C)   TempSrc: Oral   SpO2: 98%   Weight: 219 lb (99.3 kg)   Height: '5\' 9"'  (1.753 m)    Body mass index is 32.34 kg/m.  Physical Exam  Constitutional:  He is oriented to person, place, and time. He appears well-nourished.  HENT:  Head: Atraumatic.  Eyes: EOM are normal.  Neck: Normal range of motion.  Cardiovascular: Normal rate and regular rhythm.   Pulmonary/Chest: Breath sounds normal.  Abdominal: Soft. Bowel sounds are normal. He exhibits no mass.  Musculoskeletal: Normal range of motion.  Neurological: He is alert and oriented to person, place, and time.  Skin: Skin is warm and dry.  Psychiatric: He has a normal mood and affect. His behavior is normal. Judgment and thought content normal.    Data: IMPRESSION: VASCULAR  1. Stable fusiform infrarenal abdominal aortic aneurysm status post EVAR with findings of a persistent type 2 endoleak. The native aneurysm sac remains unchanged in size at 5.6 x 4.5 cm compared to 03/09/2016. 2. Scattered atherosclerotic vascular calcifications without new stenosis, aneurysm or dissection. 3. Cardiomegaly with mild left ventricular enlargement.  NON-VASCULAR  1. Colonic diverticular disease without CT evidence of active inflammation. 2. Additional ancillary findings as above without significant interval change.     Assessment/Plan:    81 year old male history of abdominal aortic aneurysm that was repaired in November 2016. At that time the aneurysm was approximately 5 cm and he has a known type II endoleak from an IMA and likely lumbar arteries. These endoleak's are again demonstrated on CT scan I cannot definitively rule out a type III endoleak. Because of his increase in size to 5.6 cm I think it is prudent to perform angiogram with evaluation of his EVAR graft as well as possible coiling of his IMA at that time. We will pursue this on January 31. I discussed risk and benefits he agrees to proceed.      Waynetta Sandy MD [facility]

## 2016-10-18 ENCOUNTER — Other Ambulatory Visit: Payer: Self-pay

## 2016-10-19 ENCOUNTER — Ambulatory Visit (INDEPENDENT_AMBULATORY_CARE_PROVIDER_SITE_OTHER): Payer: Medicare Other | Admitting: Family Medicine

## 2016-10-19 ENCOUNTER — Encounter: Payer: Self-pay | Admitting: Family Medicine

## 2016-10-19 ENCOUNTER — Telehealth: Payer: Self-pay | Admitting: Cardiology

## 2016-10-19 VITALS — BP 132/70 | HR 77 | Resp 18 | Wt 220.4 lb

## 2016-10-19 DIAGNOSIS — T82330D Leakage of aortic (bifurcation) graft (replacement), subsequent encounter: Secondary | ICD-10-CM

## 2016-10-19 DIAGNOSIS — M25561 Pain in right knee: Secondary | ICD-10-CM | POA: Diagnosis not present

## 2016-10-19 DIAGNOSIS — Z951 Presence of aortocoronary bypass graft: Secondary | ICD-10-CM

## 2016-10-19 DIAGNOSIS — I714 Abdominal aortic aneurysm, without rupture, unspecified: Secondary | ICD-10-CM

## 2016-10-19 DIAGNOSIS — Z96651 Presence of right artificial knee joint: Secondary | ICD-10-CM | POA: Diagnosis not present

## 2016-10-19 DIAGNOSIS — Z23 Encounter for immunization: Secondary | ICD-10-CM | POA: Diagnosis not present

## 2016-10-19 DIAGNOSIS — M1712 Unilateral primary osteoarthritis, left knee: Secondary | ICD-10-CM | POA: Diagnosis not present

## 2016-10-19 DIAGNOSIS — IMO0001 Reserved for inherently not codable concepts without codable children: Secondary | ICD-10-CM

## 2016-10-19 DIAGNOSIS — G8929 Other chronic pain: Secondary | ICD-10-CM | POA: Insufficient documentation

## 2016-10-19 DIAGNOSIS — M11261 Other chondrocalcinosis, right knee: Secondary | ICD-10-CM | POA: Diagnosis not present

## 2016-10-19 DIAGNOSIS — M1990 Unspecified osteoarthritis, unspecified site: Secondary | ICD-10-CM | POA: Diagnosis not present

## 2016-10-19 DIAGNOSIS — Z471 Aftercare following joint replacement surgery: Secondary | ICD-10-CM | POA: Diagnosis not present

## 2016-10-19 DIAGNOSIS — M25562 Pain in left knee: Secondary | ICD-10-CM | POA: Diagnosis not present

## 2016-10-19 DIAGNOSIS — M7651 Patellar tendinitis, right knee: Secondary | ICD-10-CM | POA: Diagnosis not present

## 2016-10-19 NOTE — Telephone Encounter (Signed)
Patient came in and dropped a preoperative clearance form form Dr. Ellyn Hack to fill out.  I gave this form to Trixie Dredge to have this filled out and call the patient when ready to be picked up.

## 2016-10-19 NOTE — Progress Notes (Addendum)
   Subjective:    Patient ID: ZYHEIM CORDS, male    DOB: 10-18-1935, 81 y.o.   MRN: [id number]  HPI He is here for preoperative evaluation. He is having difficulty withAAA and possible endovascular leak. He is scheduled for surgery at the end of the month. He is having no chest pain, shortness breath, PND or DOE. He's had no abdominal pain. He also is scheduled to eventually have a knee replacement. He has had right TKR and will need a left TKR. This is interfering with his ADLs.    Review of Systems     Objective:   Physical Exam Alert and in no distress. Tympanic membranes and canals are normal. Pharyngeal area is normal. Neck is supple without adenopathy or thyromegaly. Cardiac exam shows a regular sinus rhythm with a systolic murmur, no gallops. Lungs are clear to auscultation.        Assessment & Plan:  Need for influenza vaccination - Plan: Flu vaccine HIGH DOSE PF (Fluzone High dose)  S/P CABG x 2: 1997. SVG-OM (known to be occluded), LIMA-LAD  AAA (abdominal aortic aneurysm) without rupture (HCC)  Endoleak post endovascular aneurysm repair, subsequent encounter  Arthritis From my perspective he is cleared for surgery and is scheduled to see his cardiologist be cleared there as well. Prescription written for him to go to the pharmacy to get TDaP

## 2016-10-21 ENCOUNTER — Ambulatory Visit: Payer: Medicare Other | Admitting: Cardiology

## 2016-10-22 ENCOUNTER — Telehealth: Payer: Self-pay | Admitting: Cardiology

## 2016-10-22 NOTE — Telephone Encounter (Signed)
Called patient to verify new appointment for a presurgical consult with Dr. Ellyn Hack.  He already knew of the appointment.

## 2016-10-27 ENCOUNTER — Ambulatory Visit (INDEPENDENT_AMBULATORY_CARE_PROVIDER_SITE_OTHER): Payer: Medicare Other | Admitting: Cardiology

## 2016-10-27 VITALS — BP 162/90 | HR 64 | Ht 69.0 in | Wt 218.4 lb

## 2016-10-27 DIAGNOSIS — Z01810 Encounter for preprocedural cardiovascular examination: Secondary | ICD-10-CM | POA: Diagnosis not present

## 2016-10-27 DIAGNOSIS — IMO0001 Reserved for inherently not codable concepts without codable children: Secondary | ICD-10-CM

## 2016-10-27 DIAGNOSIS — I110 Hypertensive heart disease with heart failure: Secondary | ICD-10-CM | POA: Diagnosis not present

## 2016-10-27 DIAGNOSIS — I2510 Atherosclerotic heart disease of native coronary artery without angina pectoris: Secondary | ICD-10-CM | POA: Diagnosis not present

## 2016-10-27 DIAGNOSIS — E785 Hyperlipidemia, unspecified: Secondary | ICD-10-CM

## 2016-10-27 DIAGNOSIS — I350 Nonrheumatic aortic (valve) stenosis: Secondary | ICD-10-CM

## 2016-10-27 DIAGNOSIS — Z9861 Coronary angioplasty status: Secondary | ICD-10-CM

## 2016-10-27 DIAGNOSIS — I5032 Chronic diastolic (congestive) heart failure: Secondary | ICD-10-CM

## 2016-10-27 DIAGNOSIS — I2119 ST elevation (STEMI) myocardial infarction involving other coronary artery of inferior wall: Secondary | ICD-10-CM | POA: Diagnosis not present

## 2016-10-27 DIAGNOSIS — T82330D Leakage of aortic (bifurcation) graft (replacement), subsequent encounter: Secondary | ICD-10-CM

## 2016-10-27 NOTE — Progress Notes (Signed)
PCP: [name], MD  Clinic Note: Chief Complaint  Patient presents with  . Medical Clearance    for Knee Surgery & AAA EVAR repair, pt denied chest pain, pt c/o SOB and swelling in ankles  . Coronary Artery Disease  . Cardiac Valve Problem    Aortic Stenosis    HPI: [name] is a 81 y.o. male with a PMH below who presents today for Six-month follow-up of CAD dating back to 69. He has recently had AAA repair by Dr. Kellie Simmering. When I last saw him in October 2016, this was preoperatively for risk stratification - initially for abdominal aortic aneurysm reevaluation followed by knee surgery. He has a long-standing history of CAD dating back to 12. Most recent cardiac evaluation and treatment was on for her staged PCI back in August of 2015 weighted PCI to in-stent restenosis in the RCA as well as OM1 in both areas treated with DES stents. He then had a cardiac catheterization shortly thereafter showing widely patent stents and grafts. He is intolerant of statin. Currently on Zetia plus fenofibrate with consideration for possible conversion to PC SK 9 inhibitor. Last Cath 05/2014  THADDIUS MANES was last seen in May 2017 with stable exertional dyspnea. But mostly troubled with bone-on-bone arthritis pain. -- he is doing relatively well without any major complaints. He still noted exertional dyspnea over exertion or rushing upstairs.No real change since his CABG.  Recent Hospitalizations: For his AAA repair Nov 2016 (f/u with Dr. Kellie Simmering - next month)  Studies Reviewed:   Echo 07/2016: EF 45-50%. Inferolateral hypokinesis (correlates with scar noted on Myoview). GR 1 DD. Mild aortic stenosis. Mod LAE.   Interval History: [name] presents today relatively well. He says that his exertional dyspnea is pretty stable - no real change. He really is limited as far as any activity by his knee pain. Minutes and more than dyspnea.. This is nothing new and he goes back a long time. He  remains active doing what he can, but is not able to do "a lot ". M  He really thinks that his exertional dyspnea is more related to to his lung disease and not to any heart failure symptoms - He has not had any recurrent anginal symptoms such as chest tightness or pressure with rest or exertion. No PND or orthopnea, but he does have some mild lower extremity edema if he is on his feet for long time.  He tells me his blood pressures at home this morning was 139/65. He surprised that the report here today suggested a much higher blood pressure.  Cardiac Review of Symptoms No palpitations, lightheadedness, dizziness, weakness or syncope/near syncope. No TIA/amaurosis fugax symptoms. No melena, hematochezia, hematuria, or epstaxis. No claudication.  ROS: A comprehensive was performed. Review of Systems  Constitutional: Negative for malaise/fatigue.  HENT: Negative for congestion and nosebleeds.   Respiratory: Positive for shortness of breath (stable baseline exertional dyspnea). Negative for cough and wheezing.   Cardiovascular: Positive for leg swelling (ankles swell when on feet long tme (chronic)). Negative for chest pain, palpitations, orthopnea, claudication and PND.       Per HPI  Genitourinary: Positive for frequency (~1-2 hr nocturia).  Musculoskeletal: Positive for back pain. Joint pain: needs L knee replaced 2/2 OA.  Neurological: Negative for dizziness, tingling, tremors, sensory change, speech change, focal weakness, seizures, loss of consciousness, weakness and headaches.  Endo/Heme/Allergies: Bruises/bleeds easily.  Psychiatric/Behavioral: Negative for memory loss. The patient does not have insomnia.  All other systems reviewed and are negative.   Past Medical History:  Diagnosis Date  . AAA (abdominal aortic aneurysm) ([place]) 08/30/12   Doppler 08/30/12 had a small amount of growth. It was a 4.2 x 4.3 cm greater in size than the previous one noted at 4 x 3.8.  05-21-15 followed  by CT abd-Dr. Kellie Simmering.  . Adenomatous colon polyp   . Ankle edema    Chronic  . CAD, multiple vessel 1985-2010   Most recent cath 01/01/09: 100% Occluded LAD after SP1, patent LIMA-distal LAD with apical 90% lesion.  Cx-OM1 widely patent stent into proximal bifurcating OM1 . Follow-on Cx => 70-80% -- non-amenable PCI. Widely patent 3 overlapping stents in midRCA with only 40% RPL stenosis; SVG- OM known to be occluded.  . Choledocholithiasis   . Cholelithiasis - with cholangitis    status post ERCP with removal of calculi and biliary stent placement.  . Chronic anemia    On iron supplement; history of positive guaiac - negative colonoscopy in 1996.; Thought to be related to hemorrhoids; status post hemorrhoidectomy  . Chronic back pain     multiple surgeries; C-spine and lumbar  . Diabetes mellitus    Not on oral medication  . Diverticulitis of colon 1996  . Diverticulosis   . Dyslipidemia, goal LDL below 70   . Erectile dysfunction   . Exertional dyspnea    Chronic baseline SOB with ambulation  . GERD (gastroesophageal reflux disease)   . H/O unstable angina 06/2003, 12/2006, 05/2014   a) '04: Staged Taxus DES PCI to RCA and Cx-OM;;'08 - PCI to proximal ISR in RCA - Cypher DES; c) 05/2014: ISR in RCA & OM1, PCI with Promus P DES: RCA  2.75 x 12, OM1 3.0 mm x 8)  . H/O: pneumonia February 14  . Hemorrhoids   . Hiatal hernia   . History of nuclear stress test December 2013   LOW at Risk. Moderate region of mid to basal inferolateral scar without ischemia -- consistent with distal circumflex disease. Mild apical hypokinesis with an EF of 47%.  Marland Kitchen History of: ST elevation myocardial infarction (STEMI) involving left circumflex coronary artery with complication 1594   PTCA-circumflex; PCI in 1991  . Hypertension   . Irregular heart beat    PVCs and PACs, no arrhythmia recorded  . Moderate aortic stenosis by prior echocardiogram October 2016   Normal LV function - EF 55-60%.. Abnormal  relaxation. Mild-moderate aortic stenosis (peak/mean gradient 18/10 mmH)  . Osteoarthritis of both knees    And back; multiple back surgeries, right knee arthroplasty and left knee arthroscopic surgery x2  . S/P CABG x 2 1997   LIMA-LAD, SVG-OM  . Sleep apnea    pt. states he was told to return for f/u, to be fitted for Cpap, but pt. reports that he didn't follow up-no cpap used    Past Surgical History:  Procedure Laterality Date  . Abdominal and Lower Extremity Arterial Ultrasound  08/23/2012; 10/10/2013   Normal ABIs. Nonocclusive lower extremity disease. 4.2 cm x 4.3 cm infrarenal AAA;; 4.4 cm x 4.3 cm (essentially stable)   . ABDOMINAL AORTIC ENDOVASCULAR STENT GRAFT N/A 08/13/2015   Procedure: ABDOMINAL AORTIC ENDOVASCULAR STENT GRAFT;  Surgeon: Mal Misty, MD;  Location: [facility];  Service: Vascular;  Laterality: N/A;  . Anterior cervical plating  04/23/10   At C4-5 and a C6-7 utilizing two separate Biomet MaxAn plates.  . ANTERIOR LAT LUMBAR FUSION Left 11/22/2012   Procedure: ANTERIOR  LATERAL LUMBAR FUSION 1 LEVEL;  Surgeon: Eustace Moore, MD;  Location: [facility];  Service: Neurosurgery;  Laterality: Left;  Anterior Lateral Lumbar Fusion Lumbar Three-Four  . BACK SURGERY  1979 & x 10   pt. remarks, "I have had about 10 back surgeries"  . BILIARY STENT PLACEMENT N/A 07/03/2014   Procedure: BILIARY STENT PLACEMENT;  Surgeon: Inda Castle, MD;  Location: [facility];  Service: Endoscopy;  Laterality: N/A;  . CARDIAC CATHETERIZATION  September 2004   None Occluded vein graft to OM; diffuse RCA disease in the mid vessel, 80% circumflex-OM stenosis; follow on AV groove circumflex with sequential 90% stenoses and intervening saccular dilation   . CARDIAC CATHETERIZATION  March 2010   4 abnormal Myoview showing apical thinning (possibly due to apical LAD 95%) : 100% Occluded LAD after SV1, distal LAD grafted via LIMA -apical 95% . Cx -OM1 w/patent stent extending into OM 1 . Follow on  Cx - 70-80% - non-amenable PCI. RCA widely patent 3 overlapping stents in mRCA w/less than 40% stenosisin RPL; SVG-OM known occluded   . CATARACT EXTRACTION    . Cervical arthrodesis  04/23/10   Anterior cervical arthrodesis, C4-5, C6-7 utilizing 7-mm PEEK interbody cage packed with local autograft & Antifuse putty at C4-5 & an 8-mm cage at C6-7.  Marland Kitchen CERVICAL DISCECTOMY  04/23/10   Decompressive anterior carvical diskectomy. C4-5, C6-7  . CHOLECYSTECTOMY N/A 05/23/2015   Procedure: LAPAROSCOPIC CHOLECYSTECTOMY WITH INTRAOPERATIVE CHOLANGIOGRAM;  Surgeon: Alphonsa Overall, MD;  Location: [facility];  Service: General;  Laterality: N/A;  . [place]  . CORONARY ANGIOPLASTY  [phone]   1985 lateral STEMI Circumflex PTCA  . CORONARY ARTERY BYPASS GRAFT  1990   LIMA-LAD, SVG-OM  . ERCP N/A 07/03/2014   Procedure: ENDOSCOPIC RETROGRADE CHOLANGIOPANCREATOGRAPHY (ERCP);  Surgeon: Inda Castle, MD;  Location: [facility];  Service: Endoscopy;  Laterality: N/A;  . ERCP N/A 07/05/2014   Procedure: ENDOSCOPIC RETROGRADE CHOLANGIOPANCREATOGRAPHY (ERCP);  Surgeon: Inda Castle, MD;  Location: [facility];  Service: Endoscopy;  Laterality: N/A;  . ERCP N/A 06/30/2015   Procedure: ENDOSCOPIC RETROGRADE CHOLANGIOPANCREATOGRAPHY (ERCP);  Surgeon: Ladene Artist, MD;  Location: [facility];  Service: Endoscopy;  Laterality: N/A;  . KNEE ARTHROSCOPY Left    x 2  . LEFT HEART CATHETERIZATION WITH CORONARY ANGIOGRAM N/A 05/15/2014   Procedure: LEFT HEART CATHETERIZATION WITH CORONARY ANGIOGRAM;  Surgeon: Sinclair Grooms, MD;  Location: [facility] C[facility] LAB;  Service: Cardiovascular;  Laterality: N/A;  . LEFT HEART CATHETERIZATION WITH CORONARY/GRAFT ANGIOGRAM N/A 06/28/2014   Procedure: LEFT HEART CATHETERIZATION WITH Beatrix Fetters;  Surgeon: Troy Sine, MD;  Location: [facility] CATH LAB;  Service: Cardiovascular;  Laterality: N/A;  . LUMBAR PERCUTANEOUS PEDICLE SCREW 1 LEVEL N/A 11/22/2012   Procedure: LUMBAR  PERCUTANEOUS PEDICLE SCREW 1 LEVEL;  Surgeon: Eustace Moore, MD;  Location: [place] [facility];  Service: Neurosurgery;  Laterality: N/A;  Lumbar Three-Four Percutaneous Pedicle Screw, Lateral approach  . MINOR HEMORRHOIDECTOMY    . NM MYOVIEW LTD  December 2013   LOW RISK. Mmoderate region of mid to basal inferolateral scar without ischemia. Mild apical hypokinesis with an EF of 47%.  Marland Kitchen PERCUTANEOUS CORONARY STENT INTERVENTION (PCI-S)  September 2004   PCI - RCA 2 overlapping Taxus DES 2.75 mm x 32 mm and 2.75 mm x 12 mm (3.0 mm); PCI-Cx-OM1 - Taxus DES 3.0 mm x 20 mm (3.1 mm);   Marland Kitchen PERCUTANEOUS CORONARY STENT INTERVENTION (PCI-S)  March 2008  80% ISR in proximal Taxus stent in RCA -- covered proximally with Cypher DES 3.0 mm x 12 mm  . PERCUTANEOUS CORONARY STENT INTERVENTION (PCI-S) N/A 05/17/2014   Procedure: PERCUTANEOUS CORONARY STENT INTERVENTION (PCI-S);  Surgeon: Sinclair Grooms, MD;  [facility] LAB;  Service: Cardiovascular;  Laterality: N/A;  . POSTERIOR CERVICAL FUSION/FORAMINOTOMY N/A 05/02/2013   Procedure: POSTERIOR CERVICAL FUSION/FORAMINOTOMY CERVICAL SEVEN THORACIC-ONE;  Surgeon: Eustace Moore, MD;  Location: [place] [facility];  Service: Neurosurgery;  Laterality: N/A;  POSTERIOR CERVICAL FUSION/FORAMINOTOMY CERVICAL SEVEN THORACIC-ONE  . SPHINCTEROTOMY N/A 06/30/2015   Procedure: SPHINCTEROTOMY;  Surgeon: Ladene Artist, MD;  Location: [facility];  Service: Endoscopy;  Laterality: N/A;  . TOTAL KNEE ARTHROPLASTY Right   . TRANSTHORACIC ECHOCARDIOGRAM  December 2013; October 2016   a. EF 55-60%. mod LA dilation. Aortic Sclerosis;; b. EF 55-60%, Gr 1 DD, Mild-Mod AS (Peark - Mean Gradient 18-10 mmHg)  . TRANSTHORACIC ECHOCARDIOGRAM  07/2016   EF 45-50%. Inferolateral hypokinesis (correlates with scar noted on Myoview). GR 1 DD. Mild aortic stenosis. Mod LAE.   Current Meds  Medication Sig  . acetaminophen (TYLENOL) 500 MG tablet Take 1,000 mg by mouth every 6 (six) hours as  needed for mild pain, moderate pain or headache. Reported on 03/16/2016  . albuterol (PROVENTIL HFA;VENTOLIN HFA) 108 (90 BASE) MCG/ACT inhaler Inhale 2 puffs into the lungs every 6 (six) hours as needed for wheezing or shortness of breath.  Marland Kitchen amLODipine (NORVASC) 5 MG tablet TAKE 5 MG BY MOUTH DAILY....need appointment before further refills  . Choline Fenofibrate (FENOFIBRIC ACID) 135 MG CPDR TAKE ONE CAPSULE BY MOUTH ONCE DAILY  . clopidogrel (PLAVIX) 75 MG tablet TAKE 75 MG BY MOUTH ONCE DAILY  . ferrous sulfate 325 (65 FE) MG tablet Take 325 mg by mouth daily with breakfast.  . glucose blood test strip Pt uses one touch verio test strips and tests twice daily.  Marland Kitchen losartan (COZAAR) 100 MG tablet Take 1 tablet (100 mg total) by mouth daily.  . Multiple Vitamins-Minerals (MULTIVITAMIN WITH MINERALS) tablet Take 1 tablet by mouth daily.   . pantoprazole (PROTONIX) 40 MG tablet TAKE ONE TABLET BY MOUTH ONCE DAILY  . pioglitazone-metformin (ACTOPLUS MET) 15-500 MG tablet TAKE ONE TABLET BY MOUTH ONCE DAILY  . sildenafil (REVATIO) 20 MG tablet TAKE 2-5 TABLETS BY MOUTH DAILY AS NEEDED (Patient taking differently: TAKE 2-5 TABLETS BY MOUTH DAILY AS NEEDED FOR ERECTILE DYSFUNCTION)     Allergies  Allergen Reactions  . Lisinopril Cough  . Statins Other (See Comments)    Muscle ache  . Welchol [Colesevelam Hcl] Itching    Social History   Social History  . Marital status: Married    Spouse name: N/A  . Number of children: N/A  . Years of education: N/A   Social History Main Topics  . Smoking status: Former Smoker    Types: Cigarettes    Quit date: 10/05/1983  . Smokeless tobacco: Never Used  . Alcohol use 1.8 oz/week    3 Cans of beer per week     Comment: 3 or more cans of beer per day - 05/21/15  . Drug use: No  . Sexual activity: Not Currently   Other Topics Concern  . None   Social History Narrative   He is married, father of two, grandfather to 79, great grandfather to two.     Not really getting much exercise now, do to his significant back and hip pain.  He does not smoke and only has an alcoholic beverage.    Family History  Problem Relation Age of Onset  . Arthritis Mother   . Diabetes Father   . Heart disease Father   . Stroke Sister   . Hypertension Sister   . Heart disease Sister   . Diabetes Sister   . Breast cancer Sister   . Heart disease Brother   . Ulcers Brother   . Colon cancer Neg Hx     Wt Readings from Last 3 Encounters:  10/27/16 99.1 kg (218 lb 6.4 oz)  10/19/16 100 kg (220 lb 6.4 oz)  10/15/16 99.3 kg (219 lb)    PHYSICAL EXAM BP (!) 162/90   Pulse 64   Ht '5\' 9"'  (1.753 m)   Wt 99.1 kg (218 lb 6.4 oz)   BMI 32.25 kg/m  General appearance: A&Ox3, cooperative, appears stated age, NAD. Pleasant mood and affect, answers questions appropriately.  HEENT: [place]/AT, EOMI, MMM, anicteric sclera Neck: no adenopathy, no carotid bruit, no JVD and supple, symmetrical, trachea midline  Lungs: CTAB, normal percussion bilaterally and Good air movement, nonlabored, no W./ R./R.  Heart: normal apical impulse, RRR, S1, S2 normal and 1-2/6 SEM at value S/P without radiation. No R./G.  Abdomen: soft, mild RUQ tenderness. No HSM Extremities: No C/C/E  Pulses: 2+ and symmetric  Neurologic: Grossly normal     Adult ECG Report  Rate: 64 ;  Rhythm: normal sinus rhythm and 1 AVB (PR interval 220), occasional PVCs. LVH. Inferior MI, age undetermined;   Narrative Interpretation: Stable EKG but LVH criteria - no PVCs   Other studies Reviewed: Additional studies/ records that were reviewed today include:  Recent Labs: PCP  Lab Results  Component Value Date   CHOL 177 06/10/2016   HDL 58 06/10/2016   LDLCALC 104 06/10/2016   TRIG 73 06/10/2016   CHOLHDL 3.1 06/10/2016    ASSESSMENT / PLAN: Problem List Items Addressed This Visit    CAD S/P CABG '95- several PCis since, last 05/17/14 (Chronic)    Most recent PCI was in 2015. He is  still on Plavix but no longer on aspirin, stopped at last visit. Okay to hold Plavix at this point for surgeries.  He is on calcium channel blocker and ARB but not beta blocker due to history of bradycardia and his COPD. Not on statin as he has had significant myalgias as a result. He is on fenofibrate, with plans to refer to our pharmacy run the clinic for PCSK9 inhibitor.      History of ST elevation myocardial infarction (STEMI) of inferolateral wall (PTCA - 100% large lateral OM) (Chronic)    I think is the first time when he probably has had an accurate read of his echo showing some inferolateral hypokinesis the goes along with the inferolateral scar noted on Myoview. EF of 45-50% is probably more accurate. He is not having any heart failure or recurrent anginal symptoms. His exertional dyspnea predates his MI.      Hypertensive heart disease with chronic diastolic congestive heart failure (HCC) (Chronic)    Probably does contribute some to his exertional dyspnea. Last visit we increased his losartan, and he said earlier today his blood pressure is 130/70. Walking into the office cause a lot of pain and he probably is under a lot of pain now and therefore his blood pressures little higher. He says he checks it at home routinely and has been well-controlled now on the full dose of  100 mg losartan. We'll continue to monitor as he still has room to increase amlodipine if necessary.  He is not requiring any diuretic with no PND, orthopnea or edema. Not on beta blocker for COPD reasons as well as history of bradycardia. On ARB and calcium channel blocker      Hyperlipidemia LDL goal <70; statin intolerant (Chronic)    Refused to take Zetia cost. Only a fenofibrate. Plan is to refer to [place] (Pharm-D run [facility] in our office to discuss PCSK-9 inhibitor) Will need to recheck - but can wait until post-op.      Moderate aortic stenosis by prior echocardiogram (Chronic)    Really  mild-moderate stenosis of the aortic valve. The most recent echo did not suggest it to be as narrow as previously noted. Would simply monitor probably in about 2 years with another echo.      Pre-operative cardiovascular examination - Primary    He has history of CAD with revascularization and no active angina symptoms. He does have some chronic diastolic dysfunction but not actively symptomatic. He does have diabetes but not on insulin. No cerebrovascular disease history.  In the absence of any active anginal symptoms I would not pursue any additional cardiac evaluation preoperatively. The okay to stop Plavix for surgery.  Knee surgery is a low risk surgery from a cardiac standpoint.   PREOPERATIVE CARDIAC RISK ASSESSMENT   Revised Cardiac Risk Index:  High Risk Surgery: no; LOW  Defined as Intraperitoneal, intrathoracic or suprainguinal vascular  Active CAD: no; revascularized  CHF: Chronic; compensated  Cerebrovascular Disease: no;   Diabetes: yes; On Insulin: no  CKD (Cr >~ 2): no;   Total: 0 Estimated Risk of Adverse Outcome: LOW RISK  Estimated Risk of MI, PE, VF/VT (Cardiac Arrest), Complete Heart Block: <1%      Relevant Orders   EKG 12-Lead (Completed)   [place] post (EVAR) endovascular aneurysm repair [facility])    Plan for relook angiography by Dr. Donzetta Matters. He is stable from a cardiac standpoint to proceed. If necessary can hold Plavix.         Current medicines are reviewed at length with the patient today. (+/- concerns) n/a The following changes have been made:   Patient Instructions  No changes with current  Treatment   [place].  Your physician wants you to follow-up in 12 months with Dr Ellyn Hack. You will receive a reminder letter in the mail two months in advance. If you don't receive a letter, please call our office to schedule the follow-up appointment.  If you need a refill on your cardiac medications before your  next appointment, please call your pharmacy.    Studies Ordered:   Orders Placed This Encounter  Procedures  . EKG 12-Lead   follow-up in NOV 2017 WITH DR HARDING.     Glenetta Hew, M.D., M.S. Interventional Cardiologist   Pager # [phone] Phone # [phone] [address] [place], [place] [address]

## 2016-10-27 NOTE — Patient Instructions (Signed)
No changes with current  Treatment   YOU HAVE CLEARANCE FOR SURGERY WITH DR Ronnie Derby.  Your physician wants you to follow-up in 12 months with Dr Ellyn Hack. You will receive a reminder letter in the mail two months in advance. If you don't receive a letter, please call our office to schedule the follow-up appointment.  If you need a refill on your cardiac medications before your next appointment, please call your pharmacy.

## 2016-10-29 ENCOUNTER — Encounter: Payer: Self-pay | Admitting: Cardiology

## 2016-10-29 NOTE — Assessment & Plan Note (Signed)
Most recent PCI was in 2015. He is still on Plavix but no longer on aspirin, stopped at last visit. Okay to hold Plavix at this point for surgeries.  He is on calcium channel blocker and ARB but not beta blocker due to history of bradycardia and his COPD. Not on statin as he has had significant myalgias as a result. He is on fenofibrate, with plans to refer to our pharmacy run the clinic for PCSK9 inhibitor.

## 2016-10-29 NOTE — Assessment & Plan Note (Signed)
Really mild-moderate stenosis of the aortic valve. The most recent echo did not suggest it to be as narrow as previously noted. Would simply monitor probably in about 2 years with another echo.

## 2016-10-29 NOTE — Assessment & Plan Note (Signed)
I think is the first time when he probably has had an accurate read of his echo showing some inferolateral hypokinesis the goes along with the inferolateral scar noted on Myoview. EF of 45-50% is probably more accurate. He is not having any heart failure or recurrent anginal symptoms. His exertional dyspnea predates his MI.

## 2016-10-29 NOTE — Assessment & Plan Note (Signed)
Plan for relook angiography by Dr. Donzetta Matters. He is stable from a cardiac standpoint to proceed. If necessary can hold Plavix.

## 2016-10-29 NOTE — Assessment & Plan Note (Signed)
He has history of CAD with revascularization and no active angina symptoms. He does have some chronic diastolic dysfunction but not actively symptomatic. He does have diabetes but not on insulin. No cerebrovascular disease history.  In the absence of any active anginal symptoms I would not pursue any additional cardiac evaluation preoperatively. The okay to stop Plavix for surgery.  Knee surgery is a low risk surgery from a cardiac standpoint.   PREOPERATIVE CARDIAC RISK ASSESSMENT   Revised Cardiac Risk Index:  High Risk Surgery: no; LOW  Defined as Intraperitoneal, intrathoracic or suprainguinal vascular  Active CAD: no; revascularized  CHF: Chronic; compensated  Cerebrovascular Disease: no;   Diabetes: yes; On Insulin: no  CKD (Cr >~ 2): no;   Total: 0 Estimated Risk of Adverse Outcome: LOW RISK  Estimated Risk of MI, PE, VF/VT (Cardiac Arrest), Complete Heart Block: <1%

## 2016-10-29 NOTE — Assessment & Plan Note (Addendum)
Probably does contribute some to his exertional dyspnea. Last visit we increased his losartan, and he said earlier today his blood pressure is 130/70. Walking into the office cause a lot of pain and he probably is under a lot of pain now and therefore his blood pressures little higher. He says he checks it at home routinely and has been well-controlled now on the full dose of 100 mg losartan. We'll continue to monitor as he still has room to increase amlodipine if necessary.  He is not requiring any diuretic with no PND, orthopnea or edema. Not on beta blocker for COPD reasons as well as history of bradycardia. On ARB and calcium channel blocker

## 2016-10-29 NOTE — Assessment & Plan Note (Signed)
Refused to take Zetia cost. Only a fenofibrate. Plan is to refer to [place] (Pharm-D run [facility] in our office to discuss PCSK-9 inhibitor) Will need to recheck - but can wait until post-op.

## 2016-11-03 ENCOUNTER — Other Ambulatory Visit: Payer: Self-pay | Admitting: *Deleted

## 2016-11-03 ENCOUNTER — Encounter (HOSPITAL_COMMUNITY)
Admission: RE | Disposition: A | Discharge status: Home / Self Care | Payer: Self-pay | Source: Ambulatory Visit | Attending: Vascular Surgery

## 2016-11-03 ENCOUNTER — Ambulatory Visit (HOSPITAL_COMMUNITY)
Admission: RE | Admit: 2016-11-03 | Discharge: 2016-11-03 | Disposition: A | Discharge status: Home / Self Care | Payer: Medicare Other | Source: Ambulatory Visit | Attending: Vascular Surgery | Admitting: Vascular Surgery

## 2016-11-03 DIAGNOSIS — I714 Abdominal aortic aneurysm, without rupture: Secondary | ICD-10-CM | POA: Insufficient documentation

## 2016-11-03 DIAGNOSIS — T82330A Leakage of aortic (bifurcation) graft (replacement), initial encounter: Secondary | ICD-10-CM

## 2016-11-03 DIAGNOSIS — IMO0002 Reserved for concepts with insufficient information to code with codable children: Secondary | ICD-10-CM

## 2016-11-03 DIAGNOSIS — Z95828 Presence of other vascular implants and grafts: Secondary | ICD-10-CM

## 2016-11-03 HISTORY — PX: VISCERAL ANGIOGRAM: SHX5515

## 2016-11-03 HISTORY — PX: PERIPHERAL VASCULAR CATHETERIZATION: SHX172C

## 2016-11-03 LAB — POCT ACTIVATED CLOTTING TIME: ACTIVATED CLOTTING TIME: 158 s

## 2016-11-03 LAB — POCT I-STAT, CHEM 8
BUN: 27 mg/dL — ABNORMAL HIGH (ref 6–20)
CALCIUM ION: 1.26 mmol/L (ref 1.15–1.40)
CREATININE: 1.5 mg/dL — AB (ref 0.61–1.24)
Chloride: 108 mmol/L (ref 101–111)
GLUCOSE: 101 mg/dL — AB (ref 65–99)
HEMATOCRIT: 39 % (ref 39.0–52.0)
HEMOGLOBIN: 13.3 g/dL (ref 13.0–17.0)
Potassium: 4 mmol/L (ref 3.5–5.1)
Sodium: 140 mmol/L (ref 135–145)
TCO2: 22 mmol/L (ref 0–100)

## 2016-11-03 LAB — GLUCOSE, CAPILLARY: Glucose-Capillary: 84 mg/dL (ref 65–99)

## 2016-11-03 SURGERY — EMBOLIZATION

## 2016-11-03 MED ORDER — OXYCODONE-ACETAMINOPHEN 5-325 MG PO TABS: 1.0000 | ORAL_TABLET | ORAL | Status: DC | PRN

## 2016-11-03 MED ORDER — LIDOCAINE HCL (PF) 1 % IJ SOLN
INTRAMUSCULAR | Status: AC
  Filled 2016-11-03: qty 30

## 2016-11-03 MED ORDER — HEPARIN (PORCINE) IN NACL 2-0.9 UNIT/ML-% IJ SOLN
INTRAMUSCULAR | Status: AC
  Filled 2016-11-03: qty 1000

## 2016-11-03 MED ORDER — MIDAZOLAM HCL 2 MG/2ML IJ SOLN
INTRAMUSCULAR | Status: AC
  Filled 2016-11-03: qty 2

## 2016-11-03 MED ORDER — LIDOCAINE HCL (PF) 1 % IJ SOLN
INTRAMUSCULAR | Status: DC | PRN
  Administered 2016-11-03: 13 mL

## 2016-11-03 MED ORDER — FENTANYL CITRATE (PF) 100 MCG/2ML IJ SOLN
INTRAMUSCULAR | Status: AC
  Filled 2016-11-03: qty 2

## 2016-11-03 MED ORDER — HEPARIN SODIUM (PORCINE) 1000 UNIT/ML IJ SOLN
INTRAMUSCULAR | Status: DC | PRN
  Administered 2016-11-03: 8000 [IU] via INTRAVENOUS

## 2016-11-03 MED ORDER — SODIUM CHLORIDE 0.9 % IV SOLN
INTRAVENOUS | Status: DC
Start: 2016-11-03 — End: 2016-11-03
  Administered 2016-11-03: 10:00:00 via INTRAVENOUS

## 2016-11-03 MED ORDER — MIDAZOLAM HCL 2 MG/2ML IJ SOLN
INTRAMUSCULAR | Status: DC | PRN
  Administered 2016-11-03: 0.5 mg via INTRAVENOUS

## 2016-11-03 MED ORDER — HEPARIN SODIUM (PORCINE) 1000 UNIT/ML IJ SOLN
INTRAMUSCULAR | Status: AC
Start: 2016-11-03 — End: 2016-11-03
  Filled 2016-11-03: qty 1

## 2016-11-03 MED ORDER — IODIXANOL 320 MG/ML IV SOLN
INTRAVENOUS | Status: DC | PRN
  Administered 2016-11-03: 80 mL via INTRAVENOUS

## 2016-11-03 MED ORDER — FENTANYL CITRATE (PF) 100 MCG/2ML IJ SOLN
INTRAMUSCULAR | Status: DC | PRN
  Administered 2016-11-03: 50 ug via INTRAVENOUS
  Administered 2016-11-03: 25 ug via INTRAVENOUS

## 2016-11-03 MED ORDER — SODIUM CHLORIDE 0.9 % IV SOLN: 1.0000 mL/kg/h | INTRAVENOUS | Status: DC

## 2016-11-03 MED ORDER — MORPHINE SULFATE (PF) 4 MG/ML IV SOLN: 2.0000 mg | INTRAVENOUS | Status: DC | PRN

## 2016-11-03 MED ORDER — HEPARIN (PORCINE) IN NACL 2-0.9 UNIT/ML-% IJ SOLN
INTRAMUSCULAR | Status: DC | PRN
  Administered 2016-11-03: 1000 mL

## 2016-11-03 SURGICAL SUPPLY — 21 items
CATH NAVICROSS ST .035X90CM (MICROCATHETER) ×3 IMPLANT
CATH OMNI FLUSH 5F 65CM (CATHETERS) ×3 IMPLANT
CATH QUICKCROSS SUPP .035X90CM (MICROCATHETER) ×3 IMPLANT
COIL IDC 2D HELICAL 5MMX15CM (Vascular Products) ×3 IMPLANT
COVER PRB 48X5XTLSCP FOLD TPE (BAG) ×2 IMPLANT
COVER PROBE 5X48 (BAG) ×8
DEVICE TORQUE .014-.018 (MISCELLANEOUS) ×1 IMPLANT
DEVICE TORQUE .025-.038 (MISCELLANEOUS) ×3 IMPLANT
GUIDEWIRE ANGLED .035X260CM (WIRE) ×3 IMPLANT
GUIDEWIRE STR TIP .014X300X8 (WIRE) ×3 IMPLANT
KIT PV (KITS) ×4 IMPLANT
SHEATH FLEX ANSEL ANG 5F 45CM (SHEATH) ×3 IMPLANT
SHEATH PINNACLE 5F 10CM (SHEATH) ×3 IMPLANT
SYR MEDRAD MARK V 150ML (SYRINGE) ×4 IMPLANT
TORQUE DEVICE .014-.018 (MISCELLANEOUS) ×4
TRANSDUCER W/STOPCOCK (MISCELLANEOUS) ×4 IMPLANT
TRAY PV CATH (CUSTOM PROCEDURE TRAY) ×4 IMPLANT
WIRE BENTSON .035X145CM (WIRE) ×3 IMPLANT
WIRE MINI STICK MAX (SHEATH) ×3 IMPLANT
WIRE ROSEN-J .035X260CM (WIRE) ×3 IMPLANT
WIRE SPARTACORE .014X190CM (WIRE) ×3 IMPLANT

## 2016-11-03 NOTE — Discharge Instructions (Signed)

## 2016-11-03 NOTE — Op Note (Addendum)
    Patient name: [name] MRN: [id number] DOB: [birth year] Sex: male  11/03/2016 Pre-operative Diagnosis: type II endoleak  Post-operative diagnosis:  Same Surgeon:  [name]. Donzetta Matters, MD Procedure Performed: 1.  US guided cannulation of right common femoral artery 2.  Aortogram 3.  Selective cannulation IMA via SMA 4.  Coil embolization of IMA at origin with 33mm x 15cm interlock 5.  Moderate sedation for 77 minutes with fentanyl and versed  Indications:  81yo WM with previous EVAR for AAA now with increasing size to 5.5 cm. He has CTA evidence of type II endoleak from both lumbar and IMA vessels. He is therefore indicated for attempted treatment of type II from IMA.   Findings: on aortogram there is an evident type II endoleak without type I or III. There was a large type II endoleak from the IMA with evidence of competitive flow. Following coil embolization there was no longer flow to the aneurysm sac.    Procedure:  The patient was identified in the holding area and taken to room 8.  The patient was then placed supine on the table and prepped and draped in the usual sterile fashion.  A time out was called.  Ultrasound was used to evaluate the right common femoral artery.  It was patent .  A digital ultrasound image was acquired.  A micropuncture needle was used to access the right common femoral artery under ultrasound guidance.  An 018 wire was advanced without resistance and a micropuncture sheath was placed.  The 018 wire was removed and a benson wire was placed.  The micropuncture sheath was exchanged for a 5 french sheath.  An omniflush catheter was advanced over the wire to the level of L-1.  An abdominal angiogram was obtained.  We then selected the SMA and confirmed. A 27fr sheath was placed into the SMA and the patient heparinized. Using glide wire and navicross we selected the middle colic artery. The navicross was then advanced over wire through the arc to a descending branch.  A  micro catheter was then used to select the IMA and confirmed with angiogram. Coil embolization was then performed and completion angio demonstrated no further type II endoleak to the sac from the IMA. Satisfied, the sheath was withdrawn into the right common iliac artery and catheters removed. Patient tolerated the procedure well without immediate complication.   Contrast: 80cc   [name] C. Donzetta Matters, MD [facility] Office: [phone] Pager: [phone]

## 2016-11-03 NOTE — Progress Notes (Signed)
5 french sheath removed from right femoral artery and pressure held x 20 minutes.  Site looks good with no hematoma.  VS stable throughout sheath removal.  Distal pulse 2+ right DP.  Site dressed with 4x4 and tegaderm.  Bedrest will begin at 1620 and instructions given for bedrest.

## 2016-11-03 NOTE — H&P (Signed)
     History and Physical Update  The patient was interviewed and re-examined.  The patient's previous History and Physical has been reviewed and is unchanged from office visit. Will attempt to treat type 2 endoleak from ima today.  Anastasia Tompson C. Donzetta Matters, MD [facility] Office: [phone] Pager: [phone]   11/03/2016, 10:52 AM

## 2016-11-04 ENCOUNTER — Telehealth: Payer: Self-pay | Admitting: *Deleted

## 2016-11-04 ENCOUNTER — Encounter (HOSPITAL_COMMUNITY): Payer: Self-pay | Admitting: Vascular Surgery

## 2016-11-04 DIAGNOSIS — E785 Hyperlipidemia, unspecified: Secondary | ICD-10-CM

## 2016-11-04 DIAGNOSIS — Z79899 Other long term (current) drug therapy: Secondary | ICD-10-CM

## 2016-11-04 NOTE — Telephone Encounter (Signed)
-----   Message from Leonie Man, MD sent at 10/29/2016 11:01 PM EST ----- Regarding: FORGOT ABOUT HIS [place],  I totally forgot about Mr. Staub's lipid check. We can wait until his AAA eval, but he needs repeat Lipids --> did not take Zetia.  Was hoping to refer to [facility] in [place] after labs.  Glenetta Hew, MD

## 2016-11-04 NOTE — Telephone Encounter (Signed)
Spoke to patient.  Patient had AAA procedure completed yesterday. Knee surgery has not been schedule yet . Informed patient when he has pre -op labs done for knee surgery - please take the mailed lab slip with him and have fasting lipid done at the same time. Once lab is completed will set up appointment [facility].  Mailed labslip

## 2016-11-09 DIAGNOSIS — Z79899 Other long term (current) drug therapy: Secondary | ICD-10-CM | POA: Diagnosis not present

## 2016-11-09 DIAGNOSIS — E785 Hyperlipidemia, unspecified: Secondary | ICD-10-CM | POA: Diagnosis not present

## 2016-11-09 LAB — HEPATIC FUNCTION PANEL
ALT: 19 U/L (ref 9–46)
AST: 23 U/L (ref 10–35)
Albumin: 4 g/dL (ref 3.6–5.1)
Alkaline Phosphatase: 28 U/L — ABNORMAL LOW (ref 40–115)
BILIRUBIN DIRECT: 0.2 mg/dL (ref <=0.2)
BILIRUBIN INDIRECT: 0.5 mg/dL (ref 0.2–1.2)
BILIRUBIN TOTAL: 0.7 mg/dL (ref 0.2–1.2)
Total Protein: 6.4 g/dL (ref 6.1–8.1)

## 2016-11-09 LAB — LIPID PANEL
CHOL/HDL RATIO: 3.5 ratio (ref <5.0)
Cholesterol: 172 mg/dL (ref <200)
HDL: 49 mg/dL (ref >40)
LDL Cholesterol: 106 mg/dL — ABNORMAL HIGH (ref <100)
Triglycerides: 85 mg/dL (ref <150)
VLDL: 17 mg/dL (ref <30)

## 2016-11-18 ENCOUNTER — Other Ambulatory Visit: Payer: Self-pay | Admitting: Family Medicine

## 2016-11-18 ENCOUNTER — Other Ambulatory Visit: Payer: Self-pay | Admitting: Orthopedic Surgery

## 2016-11-18 NOTE — Telephone Encounter (Signed)
Is this okay I dont se where he has had an A1c

## 2016-11-18 NOTE — Telephone Encounter (Signed)
Go ahead and renew it to have them come in for an exam.

## 2016-11-23 ENCOUNTER — Ambulatory Visit (INDEPENDENT_AMBULATORY_CARE_PROVIDER_SITE_OTHER): Payer: Medicare Other | Admitting: Medical

## 2016-11-23 ENCOUNTER — Encounter: Payer: Self-pay | Admitting: Medical

## 2016-11-23 VITALS — BP 132/86 | HR 77 | Temp 98.0°F | Wt 220.6 lb

## 2016-11-23 DIAGNOSIS — J069 Acute upper respiratory infection, unspecified: Secondary | ICD-10-CM | POA: Diagnosis not present

## 2016-11-23 DIAGNOSIS — I2510 Atherosclerotic heart disease of native coronary artery without angina pectoris: Secondary | ICD-10-CM

## 2016-11-23 DIAGNOSIS — Z9861 Coronary angioplasty status: Secondary | ICD-10-CM | POA: Diagnosis not present

## 2016-11-23 MED ORDER — LEVOFLOXACIN 500 MG PO TABS: 500.0000 mg | ORAL_TABLET | Freq: Every day | ORAL | 0 refills | Status: DC

## 2016-11-23 NOTE — Progress Notes (Signed)
Subjective:  [name] is a 81 y.o. male who presents for illness.  He reports 2wk hx/o  started runny nose, congestion, got choked on his medication recently, tasted like rust, got to coughing phlegm.  Has some congestion, no fever, no SOB, some tightness, No fever, no sick ctx, wife some head congestion.  No other aggravating or relieving factors. No other complaint.  Just saw cardiology within a month ago.  No other aggravating or relieving factors.  No other c/o.  Past Medical History:  Diagnosis Date  . AAA (abdominal aortic aneurysm) ([place]) 08/30/12   Doppler 08/30/12 had a small amount of growth. It was a 4.2 x 4.3 cm greater in size than the previous one noted at 4 x 3.8.  05-21-15 followed by CT abd-Dr. Kellie Simmering.  . Adenomatous colon polyp   . Ankle edema    Chronic  . CAD, multiple vessel 1985-2010   Most recent cath 01/01/09: 100% Occluded LAD after SP1, patent LIMA-distal LAD with apical 90% lesion.  Cx-OM1 widely patent stent into proximal bifurcating OM1 . Follow-on Cx => 70-80% -- non-amenable PCI. Widely patent 3 overlapping stents in midRCA with only 40% RPL stenosis; SVG- OM known to be occluded.  . Choledocholithiasis   . Cholelithiasis - with cholangitis    status post ERCP with removal of calculi and biliary stent placement.  . Chronic anemia    On iron supplement; history of positive guaiac - negative colonoscopy in 1996.; Thought to be related to hemorrhoids; status post hemorrhoidectomy  . Chronic back pain     multiple surgeries; C-spine and lumbar  . Diabetes mellitus    Not on oral medication  . Diverticulitis of colon 1996  . Diverticulosis   . Dyslipidemia, goal LDL below 70   . Erectile dysfunction   . Exertional dyspnea    Chronic baseline SOB with ambulation  . GERD (gastroesophageal reflux disease)   . H/O unstable angina 06/2003, 12/2006, 05/2014   a) '04: Staged Taxus DES PCI to RCA and Cx-OM;;'08 - PCI to proximal ISR in RCA - Cypher DES; c) 05/2014:  ISR in RCA & OM1, PCI with Promus P DES: RCA  2.75 x 12, OM1 3.0 mm x 8)  . H/O: pneumonia February 14  . Hemorrhoids   . Hiatal hernia   . History of nuclear stress test December 2013   LOW at Risk. Moderate region of mid to basal inferolateral scar without ischemia -- consistent with distal circumflex disease. Mild apical hypokinesis with an EF of 47%.  Marland Kitchen History of: ST elevation myocardial infarction (STEMI) involving left circumflex coronary artery with complication [id number]   PTCA-circumflex; PCI in 1991  . Hypertension   . Irregular heart beat    PVCs and PACs, no arrhythmia recorded  . Moderate aortic stenosis by prior echocardiogram October 2016   Normal LV function - EF 55-60%.. Abnormal relaxation. Mild-moderate aortic stenosis (peak/mean gradient 18/10 mmH)  . Osteoarthritis of both knees    And back; multiple back surgeries, right knee arthroplasty and left knee arthroscopic surgery x2  . S/P CABG x 2 1997   LIMA-LAD, SVG-OM  . Sleep apnea    pt. states he was told to return for f/u, to be fitted for Cpap, but pt. reports that he didn't follow up-no cpap used    ROS as in subjective   Objective: BP 132/86   Pulse 77   Temp 98 F (36.7 C)   Wt 220 lb 9.6 oz (100.1 kg)  SpO2 99%   BMI 32.58 kg/m   General appearance: Alert, WD/WN, no distress                             Skin: warm, no rash                           Head: no sinus tenderness,                            Eyes: conjunctiva normal, corneas clear, PERRLA                            Ears: pearly TMs, external ear canals normal                          Nose: septum midline, turbinates swollen, with erythema and clear discharge             Mouth/throat: MMM, tongue normal, mild pharyngeal erythema                           Neck: supple, no adenopathy, no thyromegaly, non tender                          Heart: RRR, normal S1, [id number] holosystolic murmur heard best in upper borders                         Lungs:  CTA bilaterally, no wheezes, rales, or rhonchi       Assessment  Encounter Diagnosis  Name Primary?  Marland Kitchen Upper respiratory tract infection, unspecified type Yes     Plan: He declines CXR.   Begin Levaquin for suspected early bacterial respiratory tract infection, rest, hydrate well.    Can c/t mucinex DM.    Patient was advised to call or return if worse or not improving in the next few days.    Patient voiced understanding of diagnosis, recommendations, and treatment plan.     [name] was seen today for possible uri.  Diagnoses and all orders for this visit:  Upper respiratory tract infection, unspecified type  Other orders -     levofloxacin (LEVAQUIN) 500 MG tablet; Take 1 tablet (500 mg total) by mouth daily.

## 2016-12-01 ENCOUNTER — Ambulatory Visit
Admission: RE | Admit: 2016-12-01 | Discharge: 2016-12-01 | Disposition: A | Discharge status: Home / Self Care | Payer: Medicare Other | Source: Ambulatory Visit | Attending: Medical | Admitting: Medical

## 2016-12-01 ENCOUNTER — Telehealth: Payer: Self-pay | Admitting: Medical

## 2016-12-01 ENCOUNTER — Other Ambulatory Visit: Payer: Self-pay | Admitting: Medical

## 2016-12-01 DIAGNOSIS — R05 Cough: Secondary | ICD-10-CM

## 2016-12-01 DIAGNOSIS — R059 Cough, unspecified: Secondary | ICD-10-CM

## 2016-12-01 NOTE — Telephone Encounter (Signed)
Yes if symptoms persist, go for CXR.  Order has been placed.

## 2016-12-01 NOTE — Telephone Encounter (Signed)
Pt's wife came in for an appt with Dr. Tomi Bamberger. While here she stated that [name] is still having issues coughing up phlegm. She states at his appt he was informed to call back for a xray if symptoms continued. She states she would like him to go. Pt can be reached at [phone].

## 2016-12-01 NOTE — Telephone Encounter (Signed)
Called pt he is going to have to x-ray done

## 2016-12-02 NOTE — Telephone Encounter (Signed)
Error

## 2016-12-03 ENCOUNTER — Other Ambulatory Visit: Payer: Self-pay | Admitting: Medical

## 2016-12-03 MED ORDER — PROMETHAZINE-DM 6.25-15 MG/5ML PO SYRP: 5.0000 mL | ORAL_SOLUTION | Freq: Four times a day (QID) | ORAL | 0 refills | Status: DC | PRN

## 2016-12-08 NOTE — Pre-Procedure Instructions (Addendum)
TIGE MEAS  12/08/2016      [place] ([place]), [place] [address] [place] [address] Phone: [phone] Fax: [phone]  [place] [place] [address] Phone: [phone] Fax: [phone]  [facility] [place], [address] [place] [address] Phone: [phone] Fax: [phone]    Your procedure is scheduled on Mon. March 19  Report to [facility] Admitting at 8:00 A.M.  Call this number if you have problems the morning of surgery:  [phone]   Remember:  Do not eat food or drink liquids after midnight on Sun. March 18   Take these medicines the morning of surgery with A SIP OF WATER : tylenol if needed, albuterol if needed-bring to hospital, amlodipine (norvasc), protonix, phenergan if needed             Stop plavix per Dr. Ronnie Derby           1 week prior to surgery stop: advil, motrin, aleve, BC Powders, goody's, vitamins/herbal medicines     How to Manage Your Diabetes Before and After Surgery  Why is it important to control my blood sugar before and after surgery? . Improving blood sugar levels before and after surgery helps healing and can limit problems. . A way of improving blood sugar control is eating a healthy diet by: o  Eating less sugar and carbohydrates o  Increasing activity/exercise o  Talking with your doctor about reaching your blood sugar goals . High blood sugars (greater than 180 mg/dL) can raise your risk of infections and slow your recovery, so you will need to focus on controlling your diabetes during the weeks before surgery. . Make sure that the doctor who takes care of your diabetes knows about your planned surgery including the date and location.  How do I manage my blood sugar before  surgery? . Check your blood sugar at least 4 times a day, starting 2 days before surgery, to make sure that the level is not too high or low. o Check your blood sugar the morning of your surgery when you wake up and every 2 hours until you get to the Short Stay unit. . If your blood sugar is less than 70 mg/dL, you will need to treat for low blood sugar: o Do not take insulin. o Treat a low blood sugar (less than 70 mg/dL) with  cup of clear juice (cranberry or apple), 4 glucose tablets, OR glucose gel. o Recheck blood sugar in 15 minutes after treatment (to make sure it is greater than 70 mg/dL). If your blood sugar is not greater than 70 mg/dL on recheck, call [phone] for further instructions. . Report your blood sugar to the short stay nurse when you get to Short Stay.  . If you are admitted to the hospital after surgery: o Your blood sugar will be checked by the staff and you will probably be given insulin after surgery (instead of oral diabetes medicines) to make sure you have good blood sugar levels. o The goal for blood sugar control after surgery is 80-180 mg/dL.      WHAT DO I DO ABOUT MY DIABETES MEDICATION?   Marland Kitchen Do not take oral diabetes medicines (pills) the morning of surgery.  Do not wear jewelry.  Do not wear lotions, powders, or perfumes, or deoderant.  Do not shave 48 hours prior to surgery.  Men may shave face and neck.  Do not bring valuables to the hospital.  [facility] is not responsible for any belongings or valuables.  Contacts, dentures or bridgework may not be worn into surgery.  Leave your suitcase in the car.  After surgery it may be brought to your room.  For patients admitted to the hospital, discharge time will be determined by your treatment team.  Patients discharged the day of surgery will not be allowed to drive home.    Special instructions:  [facility]- Preparing For Surgery  Before surgery, you can play an important role.  Because skin is not sterile, your skin needs to be as free of germs as possible. You can reduce the number of germs on your skin by washing with CHG (chlorahexidine gluconate) Soap before surgery.  CHG is an antiseptic cleaner which kills germs and bonds with the skin to continue killing germs even after washing.  Please do not use if you have an allergy to CHG or antibacterial soaps. If your skin becomes reddened/irritated stop using the CHG.  Do not shave (including legs and underarms) for at least 48 hours prior to first CHG shower. It is OK to shave your face.  Please follow these instructions carefully.   1. Shower the NIGHT BEFORE SURGERY and the MORNING OF SURGERY with CHG.   2. If you chose to wash your hair, wash your hair first as usual with your normal shampoo.  3. After you shampoo, rinse your hair and body thoroughly to remove the shampoo.  4. Use CHG as you would any other liquid soap. You can apply CHG directly to the skin and wash gently with a scrungie or a clean washcloth.   5. Apply the CHG Soap to your body ONLY FROM THE NECK DOWN.  Do not use on open wounds or open sores. Avoid contact with your eyes, ears, mouth and genitals (private parts). Wash genitals (private parts) with your normal soap.  6. Wash thoroughly, paying special attention to the area where your surgery will be performed.  7. Thoroughly rinse your body with warm water from the neck down.  8. DO NOT shower/wash with your normal soap after using and rinsing off the CHG Soap.  9. Pat yourself dry with a CLEAN TOWEL.   10. Wear CLEAN PAJAMAS   11. Place CLEAN SHEETS on your bed the night of your first shower and DO NOT SLEEP WITH PETS.    Day of Surgery: Do not apply any deodorants/lotions. Please wear clean clothes to the hospital/surgery center.      Please read over the following fact sheets that you were given. Coughing and Deep Breathing, Total Joint Packet and MRSA Information

## 2016-12-09 ENCOUNTER — Encounter (HOSPITAL_COMMUNITY)
Admission: RE | Admit: 2016-12-09 | Discharge: 2016-12-09 | Disposition: A | Discharge status: Home / Self Care | Payer: Medicare Other | Source: Ambulatory Visit | Attending: Orthopedic Surgery | Admitting: Orthopedic Surgery

## 2016-12-09 ENCOUNTER — Encounter (HOSPITAL_COMMUNITY): Payer: Self-pay

## 2016-12-09 DIAGNOSIS — M1712 Unilateral primary osteoarthritis, left knee: Secondary | ICD-10-CM | POA: Diagnosis not present

## 2016-12-09 DIAGNOSIS — Z01812 Encounter for preprocedural laboratory examination: Secondary | ICD-10-CM | POA: Diagnosis not present

## 2016-12-09 HISTORY — DX: Unspecified asthma, uncomplicated: J45.909

## 2016-12-09 LAB — COMPREHENSIVE METABOLIC PANEL
ALBUMIN: 3.6 g/dL (ref 3.5–5.0)
ALK PHOS: 27 U/L — AB (ref 38–126)
ALT: 19 U/L (ref 17–63)
AST: 26 U/L (ref 15–41)
Anion gap: 6 (ref 5–15)
BILIRUBIN TOTAL: 0.7 mg/dL (ref 0.3–1.2)
BUN: 20 mg/dL (ref 6–20)
CALCIUM: 9.3 mg/dL (ref 8.9–10.3)
CO2: 24 mmol/L (ref 22–32)
CREATININE: 1.46 mg/dL — AB (ref 0.61–1.24)
Chloride: 109 mmol/L (ref 101–111)
GFR calc Af Amer: 51 mL/min — ABNORMAL LOW (ref >60)
GFR calc non Af Amer: 44 mL/min — ABNORMAL LOW (ref >60)
GLUCOSE: 94 mg/dL (ref 65–99)
Potassium: 4.3 mmol/L (ref 3.5–5.1)
Sodium: 139 mmol/L (ref 135–145)
TOTAL PROTEIN: 6.7 g/dL (ref 6.5–8.1)

## 2016-12-09 LAB — CBC WITH DIFFERENTIAL/PLATELET
BASOS ABS: 0 10*3/uL (ref 0.0–0.1)
BASOS PCT: 1 %
Eosinophils Absolute: 0 10*3/uL (ref 0.0–0.7)
Eosinophils Relative: 1 %
HEMATOCRIT: 36.7 % — AB (ref 39.0–52.0)
HEMOGLOBIN: 12.1 g/dL — AB (ref 13.0–17.0)
Lymphocytes Relative: 34 %
Lymphs Abs: 1.2 10*3/uL (ref 0.7–4.0)
MCH: 30.8 pg (ref 26.0–34.0)
MCHC: 33 g/dL (ref 30.0–36.0)
MCV: 93.4 fL (ref 78.0–100.0)
MONOS PCT: 8 %
Monocytes Absolute: 0.3 10*3/uL (ref 0.1–1.0)
NEUTROS ABS: 2 10*3/uL (ref 1.7–7.7)
NEUTROS PCT: 56 %
Platelets: 200 10*3/uL (ref 150–400)
RBC: 3.93 MIL/uL — AB (ref 4.22–5.81)
RDW: 14.6 % (ref 11.5–15.5)
WBC: 3.6 10*3/uL — AB (ref 4.0–10.5)

## 2016-12-09 LAB — SURGICAL PCR SCREEN
MRSA, PCR: NEGATIVE
Staphylococcus aureus: NEGATIVE

## 2016-12-09 LAB — GLUCOSE, CAPILLARY: Glucose-Capillary: 84 mg/dL (ref 65–99)

## 2016-12-09 NOTE — Progress Notes (Signed)
PCP: Dr. Jill Alexanders Cardiologist: Dr. Ellyn Hack Vascular: Dr. Donzetta Matters  Fasting sugars 80-120's  Pt. States he was instructed by Dr. Ronnie Derby to stop plavix 1 week prior to surgery.

## 2016-12-09 NOTE — Progress Notes (Signed)
   12/09/16 1025  OBSTRUCTIVE SLEEP APNEA  Have you ever been diagnosed with sleep apnea through a sleep study? No  Do you snore loudly (loud enough to be heard through closed doors)?  1  Do you often feel tired, fatigued, or sleepy during the daytime (such as falling asleep during driving or talking to someone)? 0  Has anyone observed you stop breathing during your sleep? 1  Do you have, or are you being treated for high blood pressure? 1  BMI more than 35 kg/m2? 0  Age > 50 (1-yes) 1  Neck circumference greater than:Male 16 inches or larger, Male 17inches or larger? 0  Male Gender (Yes=1) 1  Obstructive Sleep Apnea Score 5  Score 5 or greater  Results sent to PCP

## 2016-12-10 ENCOUNTER — Encounter: Payer: Self-pay | Admitting: Vascular Surgery

## 2016-12-10 LAB — HEMOGLOBIN A1C
Hgb A1c MFr Bld: 5.8 % — ABNORMAL HIGH (ref 4.8–5.6)
Mean Plasma Glucose: 120 mg/dL

## 2016-12-10 NOTE — Progress Notes (Addendum)
Anesthesia Chart Review:  Pt is a 81 year old male scheduled for left total knee arthroplasty on 12/20/2016 with Vickey Huger, M.D.  - Cardiologist is Glenetta Hew, MD who cleared pt for surgery at last office visit 10/29/16.  - PCP is Jill Alexanders, MD, last office visit 11/23/16 with Chana Bode, PA for acute respiratory infection.  - Vascular surgeon is Servando Snare, MD  PMH includes: CAD (S/p CABG 1997 (LIMA-LAD, SVG-OM), s/p multiple stents/PCI, most recent was stenting to OM and RCA 05/17/14), HTN, DM, hyperlipidemia, anemia, AAA (s/p EVAR 08/13/15; type II endoleak with coil embolization of IMA 11/03/16), OSA (no CPAP), GERD. Former smoker. BMI 32. Hx alcohol abuse, reports now drinks 4oz wine daily.   S/p ERCP 06/30/15, 07/05/14, 07/03/14. S/p laparoscopic cholecystectomy 05/23/15. S/p posterior cervical fusion/ foraminotomy 05/02/13. S/p anterior lumbar fusion 11/22/12.   Medications include: Albuterol, amlodipine, fenofibrate, plavix, iron, losartan, protonix, pioglitazone-metformin, sildenafil. Pt to stop Plavix 1 week before surgery.  Preoperative labs reviewed.   - HbA1c 5.8, glucose 94  CXR 12/01/16: There is no pneumonia, CHF, nor other acute cardiopulmonary abnormality. Thoracic aortic atherosclerosis.  EKG 10/27/16: Sinus rhythm with first-degree AV block. Possible inferior infarct, age undetermined.  Echo 07/19/16:  - Left ventricle: The cavity size was mildly dilated. Wall thickness was normal. Systolic function was mildly reduced. The estimated ejection fraction was in the range of 45% to 50%. There is hypokinesis of the inferolateral myocardium. Doppler parameters are consistent with abnormal left ventricular relaxation (grade 1 diastolic dysfunction). - Aortic valve: There was mild stenosis. Valve area (VTI): 1.58 cm^2. Valve area (Vmax): 1.61 cm^2. Valve area (Vmean): 1.49 cm^2. - Mitral valve: Calcified annulus. - Left atrium: The atrium was moderately dilated. - Right  atrium: The atrium was mildly dilated.  Cardiac cath 06/28/14:  1. LM: Normal 2. LAD: Totally occluded after septal perforating artery and small diagonal vessel proximally. 3. CX: Widely patent stent proximally extending into the midportion of the OM. No in-stent restenosis. AV groove CX which arose from stented segment small caliber and had 80% ostial stenosis followed by an ectatic segment 4. RCA: Widely patent tandems stents commencing proximally and extending into the region of the acute margin without in-stent restenosis. RCA supplied the PDA vessel. Mild 10-20% narrowing in the continuation branch beyond the PDA takeoff. 5. LIMA to LAD: Widely patent anastomosis into the mid LAD. Filling of the LAD retrograde to the proximal occluded segment of the LAD beyond the anastomosis widely patent 6. SVG to OM: Occluded at the ostium, which is old  Pt has f/u visit with Dr. Donzetta Matters 12/17/16. Will review notes when available.   Willeen Cass, FNP-BC [facility] Short Stay Surgical Center/Anesthesiology Phone: [phone] 12/10/2016 12:41 PM   Addendum:   Per Dr. Claretha Cooper note 12/17/16, endoleak persists despite embolization. Pt referred to IR for lumbar artery embolization.   I spoke with Colletta Maryland in Dr. Claretha Cooper office who spoke with Dr. Donzetta Matters.  Pt can proceed with TKA as scheduled.   If no changes, I anticipate pt can proceed with surgery as scheduled.    Willeen Cass, FNP-BC [facility] Short Stay Surgical Center/Anesthesiology Phone: [phone] 12/17/2016 3:25 PM

## 2016-12-17 ENCOUNTER — Ambulatory Visit
Admission: RE | Admit: 2016-12-17 | Discharge: 2016-12-17 | Disposition: A | Discharge status: Home / Self Care | Payer: Medicare Other | Source: Ambulatory Visit | Attending: Vascular Surgery | Admitting: Vascular Surgery

## 2016-12-17 ENCOUNTER — Ambulatory Visit (INDEPENDENT_AMBULATORY_CARE_PROVIDER_SITE_OTHER): Payer: Self-pay | Admitting: Vascular Surgery

## 2016-12-17 ENCOUNTER — Encounter: Payer: Self-pay | Admitting: Vascular Surgery

## 2016-12-17 VITALS — BP 139/77 | HR 77 | Temp 96.4°F | Resp 18 | Ht 69.0 in | Wt 215.0 lb

## 2016-12-17 DIAGNOSIS — T82330D Leakage of aortic (bifurcation) graft (replacement), subsequent encounter: Secondary | ICD-10-CM

## 2016-12-17 DIAGNOSIS — IMO0001 Reserved for inherently not codable concepts without codable children: Secondary | ICD-10-CM

## 2016-12-17 DIAGNOSIS — Z95828 Presence of other vascular implants and grafts: Secondary | ICD-10-CM | POA: Diagnosis not present

## 2016-12-17 DIAGNOSIS — IMO0002 Reserved for concepts with insufficient information to code with codable children: Secondary | ICD-10-CM

## 2016-12-17 DIAGNOSIS — T82330A Leakage of aortic (bifurcation) graft (replacement), initial encounter: Secondary | ICD-10-CM

## 2016-12-17 MED ORDER — DEXAMETHASONE SODIUM PHOSPHATE 10 MG/ML IJ SOLN
8.0000 mg | Freq: Once | INTRAMUSCULAR | Status: DC
  Filled 2016-12-17: qty 1

## 2016-12-17 MED ORDER — TRANEXAMIC ACID 1000 MG/10ML IV SOLN
1000.0000 mg | INTRAVENOUS | Status: AC
  Administered 2016-12-20: 1000 mg via INTRAVENOUS
  Filled 2016-12-17: qty 10

## 2016-12-17 MED ORDER — ACETAMINOPHEN 500 MG PO TABS
1000.0000 mg | ORAL_TABLET | Freq: Once | ORAL | Status: AC
  Administered 2016-12-20: 1000 mg via ORAL
  Filled 2016-12-17: qty 2

## 2016-12-17 MED ORDER — BUPIVACAINE LIPOSOME 1.3 % IJ SUSP
20.0000 mL | INTRAMUSCULAR | Status: AC
Start: 2016-12-20 — End: 2016-12-20
  Administered 2016-12-20: 20 mL
  Filled 2016-12-17: qty 20

## 2016-12-17 MED ORDER — CEFAZOLIN SODIUM-DEXTROSE 2-4 GM/100ML-% IV SOLN
2.0000 g | INTRAVENOUS | Status: AC
  Administered 2016-12-20: 2 g via INTRAVENOUS
  Filled 2016-12-17: qty 100

## 2016-12-17 MED ORDER — GABAPENTIN 300 MG PO CAPS
300.0000 mg | ORAL_CAPSULE | Freq: Once | ORAL | Status: AC
  Administered 2016-12-20: 300 mg via ORAL
  Filled 2016-12-17: qty 1

## 2016-12-17 MED ORDER — IOPAMIDOL (ISOVUE-370) INJECTION 76%: 75.0000 mL | Freq: Once | INTRAVENOUS | Status: DC | PRN

## 2016-12-17 NOTE — Progress Notes (Signed)
Subjective:     Patient ID: [name], male   DOB: 1936/06/20, 81 y.o.   MRN: [id number]  HPI 81 year old male follows up status post embolization of type II endoleak from right common femoral approach. He has not had any abdominal or right lower extremity complaints since that time. CT scan was performed today prior to arrival.   Review of Systems No complaints    Objective:   Physical Exam aaox3 Non labored respirations Abdomen is soft, no palpable mass R groin is soft without hematoma     IMPRESSION: VASCULAR  After embolization of the IMA, the type 2 endoleak persists and sac diameter is stable with a maximal diameter of 5.6 x 4.5 cm. The culprit is a persistent lumbar artery.  Assessment/plan:     81 year old male with persistent type II endoleak on CT angio performed today with 5.6 cm aneurysm in greatest dimension. His IMA has been embolized and he is not had any issues from this. Unfortunately the endoleak continues to arise from the lumbar artery which is evident on the scan today. I have spoken with Dr. Barbie Banner from interventional radiology and we will refer for embolization of his lumbar artery. He will follow up in 3 months with repeat CT scan.       Mattalynn Crandle C. Donzetta Matters, MD [facility] Office: [phone] Pager: [phone]

## 2016-12-19 NOTE — H&P (Signed)
[name] MRN:  [id number] DOB/SEX:  [birth year]/male  CHIEF COMPLAINT:  Painful left Knee  HISTORY: Patient is a 81 y.o. male presented with a history of pain in the left knee. Onset of symptoms was gradual starting a few years ago with gradually worsening course since that time. Patient has been treated conservatively with over-the-counter NSAIDs and activity modification. Patient currently rates pain in the knee at 10 out of 10 with activity. There is pain at night.  PAST MEDICAL HISTORY: Patient Active Problem List   Diagnosis Date Noted  . Endoleak post (EVAR) endovascular aneurysm repair ([place]) 03/16/2016  . Moderate aortic stenosis by prior echocardiogram 02/02/2016  . Statin intolerance 09/30/2015  . Pre-operative cardiovascular examination 07/23/2015  . Calculus of bile duct with obstruction and without cholangitis or cholecystitis   . Obstruction of biliary stent   . History of unstable angina 01/08/2015  . Presence of drug coated stent in right coronary artery and circumflex coronary artery 01/08/2015  . Chronic renal insufficiency, stage III (moderate) 06/06/2014  . Coronary stent restenosis with uncertain cause: Required PCI for ISR of OM1 (Promus P 2.75 mm x 12 mm) & pRCA 05/17/2014  . Obesity (BMI 30-39.9) 06/28/2013  . Encounter for long-term (current) use of medications 06/28/2013  . ACE-inhibitor cough 04/16/2013  . Chronic diastolic CHF (congestive heart failure) ([place]) 11/24/2012  . Type II diabetes mellitus with complication -- CAD, AAA 03/04/2011    Class: Diagnosis of  . Hypertensive heart disease with chronic diastolic congestive heart failure ([place]) 03/04/2011  . Hyperlipidemia LDL goal <70; statin intolerant 03/04/2011    Class: Diagnosis of  . ED (erectile dysfunction) of organic origin 03/04/2011  . GERD (gastroesophageal reflux disease) 03/04/2011  . S/P CABG x 2: 1997. SVG-OM (known to be occluded), LIMA-LAD 06/28/1996  . History of ST elevation  myocardial infarction (STEMI) of inferolateral wall (PTCA - 100% large lateral OM) 06/28/1996    Class: History of  . CAD S/P CABG '95- several PCis since, last 05/17/14 06/28/1994    Class: History of   Past Medical History:  Diagnosis Date  . AAA (abdominal aortic aneurysm) ([place]) 08/30/12   Doppler 08/30/12 had a small amount of growth. It was a 4.2 x 4.3 cm greater in size than the previous one noted at 4 x 3.8.  05-21-15 followed by CT abd-Dr. Kellie Simmering.  . Adenomatous colon polyp   . Ankle edema    Chronic  . Asthma    as a child  . CAD, multiple vessel 1985-2010   Most recent cath 01/01/09: 100% Occluded LAD after SP1, patent LIMA-distal LAD with apical 90% lesion.  Cx-OM1 widely patent stent into proximal bifurcating OM1 . Follow-on Cx => 70-80% -- non-amenable PCI. Widely patent 3 overlapping stents in midRCA with only 40% RPL stenosis; SVG- OM known to be occluded.  . Choledocholithiasis   . Cholelithiasis - with cholangitis    status post ERCP with removal of calculi and biliary stent placement.  . Chronic anemia    On iron supplement; history of positive guaiac - negative colonoscopy in 1996.; Thought to be related to hemorrhoids; status post hemorrhoidectomy  . Chronic back pain     multiple surgeries; C-spine and lumbar  . Diabetes mellitus    Not on oral medication  . Diverticulitis of colon 1996  . Diverticulosis   . Dyslipidemia, goal LDL below 70   . Erectile dysfunction   . Exertional dyspnea    Chronic baseline SOB with ambulation  .  GERD (gastroesophageal reflux disease)   . H/O unstable angina 06/2003, 12/2006, 05/2014   a) '04: Staged Taxus DES PCI to RCA and Cx-OM;;'08 - PCI to proximal ISR in RCA - Cypher DES; c) 05/2014: ISR in RCA & OM1, PCI with Promus P DES: RCA  2.75 x 12, OM1 3.0 mm x 8)  . H/O: pneumonia February 14  . Hemorrhoids   . Hiatal hernia   . History of nuclear stress test December 2013   LOW at Risk. Moderate region of mid to basal inferolateral  scar without ischemia -- consistent with distal circumflex disease. Mild apical hypokinesis with an EF of 47%.  Marland Kitchen History of: ST elevation myocardial infarction (STEMI) involving left circumflex coronary artery with complication 7564   PTCA-circumflex; PCI in 1991  . Hypertension   . Irregular heart beat    PVCs and PACs, no arrhythmia recorded  . Moderate aortic stenosis by prior echocardiogram October 2016   Normal LV function - EF 55-60%.. Abnormal relaxation. Mild-moderate aortic stenosis (peak/mean gradient 18/10 mmH)  . Osteoarthritis of both knees    And back; multiple back surgeries, right knee arthroplasty and left knee arthroscopic surgery x2  . Pneumonia 2016  . S/P CABG x 2 1997   LIMA-LAD, SVG-OM  . Sleep apnea    pt. states he was told to return for f/u, to be fitted for Cpap, but pt. reports that he didn't follow up-no cpap used   Past Surgical History:  Procedure Laterality Date  . Abdominal and Lower Extremity Arterial Ultrasound  08/23/2012; 10/10/2013   Normal ABIs. Nonocclusive lower extremity disease. 4.2 cm x 4.3 cm infrarenal AAA;; 4.4 cm x 4.3 cm (essentially stable)   . ABDOMINAL AORTIC ENDOVASCULAR STENT GRAFT N/A 08/13/2015   Procedure: ABDOMINAL AORTIC ENDOVASCULAR STENT GRAFT;  Surgeon: Mal Misty, MD;  Location: [facility];  Service: Vascular;  Laterality: N/A;  . Anterior cervical plating  04/23/10   At C4-5 and a C6-7 utilizing two separate Biomet MaxAn plates.  . ANTERIOR LAT LUMBAR FUSION Left 11/22/2012   Procedure: ANTERIOR LATERAL LUMBAR FUSION 1 LEVEL;  Surgeon: Eustace Moore, MD;  Location: [facility];  Service: Neurosurgery;  Laterality: Left;  Anterior Lateral Lumbar Fusion Lumbar Three-Four  . BACK SURGERY  1979 & x 10   pt. remarks, "I have had about 10 back surgeries"  . BILIARY STENT PLACEMENT N/A 07/03/2014   Procedure: BILIARY STENT PLACEMENT;  Surgeon: Inda Castle, MD;  Location: [facility];  Service: Endoscopy;  Laterality: N/A;  .  CARDIAC CATHETERIZATION  September 2004   None Occluded vein graft to OM; diffuse RCA disease in the mid vessel, 80% circumflex-OM stenosis; follow on AV groove circumflex with sequential 90% stenoses and intervening saccular dilation   . CARDIAC CATHETERIZATION  March 2010   4 abnormal Myoview showing apical thinning (possibly due to apical LAD 95%) : 100% Occluded LAD after SV1, distal LAD grafted via LIMA -apical 95% . Cx -OM1 w/patent stent extending into OM 1 . Follow on Cx - 70-80% - non-amenable PCI. RCA widely patent 3 overlapping stents in mRCA w/less than 40% stenosisin RPL; SVG-OM known occluded   . CATARACT EXTRACTION    . Cervical arthrodesis  04/23/10   Anterior cervical arthrodesis, C4-5, C6-7 utilizing 7-mm PEEK interbody cage packed with local autograft & Antifuse putty at C4-5 & an 8-mm cage at C6-7.  Marland Kitchen CERVICAL DISCECTOMY  04/23/10   Decompressive anterior carvical diskectomy. C4-5, C6-7  . CHOLECYSTECTOMY N/A  05/23/2015   Procedure: LAPAROSCOPIC CHOLECYSTECTOMY WITH INTRAOPERATIVE CHOLANGIOGRAM;  Surgeon: Alphonsa Overall, MD;  Location: [facility];  Service: General;  Laterality: N/A;  . [place]  . CORONARY ANGIOPLASTY  [id number]   1985 lateral STEMI Circumflex PTCA  . CORONARY ARTERY BYPASS GRAFT  1990   LIMA-LAD, SVG-OM  . ERCP N/A 07/03/2014   Procedure: ENDOSCOPIC RETROGRADE CHOLANGIOPANCREATOGRAPHY (ERCP);  Surgeon: Inda Castle, MD;  Location: [facility];  Service: Endoscopy;  Laterality: N/A;  . ERCP N/A 07/05/2014   Procedure: ENDOSCOPIC RETROGRADE CHOLANGIOPANCREATOGRAPHY (ERCP);  Surgeon: Inda Castle, MD;  Location: [facility];  Service: Endoscopy;  Laterality: N/A;  . ERCP N/A 06/30/2015   Procedure: ENDOSCOPIC RETROGRADE CHOLANGIOPANCREATOGRAPHY (ERCP);  Surgeon: Ladene Artist, MD;  Location: [facility];  Service: Endoscopy;  Laterality: N/A;  . KNEE ARTHROSCOPY Left    x 2  . LEFT HEART CATHETERIZATION WITH CORONARY ANGIOGRAM N/A 05/15/2014    Procedure: LEFT HEART CATHETERIZATION WITH CORONARY ANGIOGRAM;  Surgeon: Sinclair Grooms, MD;  Location: [facility] C[facility] LAB;  Service: Cardiovascular;  Laterality: N/A;  . LEFT HEART CATHETERIZATION WITH CORONARY/GRAFT ANGIOGRAM N/A 06/28/2014   Procedure: LEFT HEART CATHETERIZATION WITH Beatrix Fetters;  Surgeon: Troy Sine, MD;  Location: [facility] CATH LAB;  Service: Cardiovascular;  Laterality: N/A;  . LUMBAR PERCUTANEOUS PEDICLE SCREW 1 LEVEL N/A 11/22/2012   Procedure: LUMBAR PERCUTANEOUS PEDICLE SCREW 1 LEVEL;  Surgeon: Eustace Moore, MD;  Location: [facility];  Service: Neurosurgery;  Laterality: N/A;  Lumbar Three-Four Percutaneous Pedicle Screw, Lateral approach  . MINOR HEMORRHOIDECTOMY    . NM MYOVIEW LTD  December 2013   LOW RISK. Mmoderate region of mid to basal inferolateral scar without ischemia. Mild apical hypokinesis with an EF of 47%.  Marland Kitchen PERCUTANEOUS CORONARY STENT INTERVENTION (PCI-S)  September 2004   PCI - RCA 2 overlapping Taxus DES 2.75 mm x 32 mm and 2.75 mm x 12 mm (3.0 mm); PCI-Cx-OM1 - Taxus DES 3.0 mm x 20 mm (3.1 mm);   Marland Kitchen PERCUTANEOUS CORONARY STENT INTERVENTION (PCI-S)  March 2008   80% ISR in proximal Taxus stent in RCA -- covered proximally with Cypher DES 3.0 mm x 12 mm  . PERCUTANEOUS CORONARY STENT INTERVENTION (PCI-S) N/A 05/17/2014   Procedure: PERCUTANEOUS CORONARY STENT INTERVENTION (PCI-S);  Surgeon: Sinclair Grooms, MD;  Location: [facility] CATH LAB;  Service: Cardiovascular;  Laterality: N/A;  . PERIPHERAL VASCULAR CATHETERIZATION  11/03/2016   Procedure: Embolization;  Surgeon: Waynetta Sandy, MD;  Location: [facility];  Service: Cardiovascular;;  . POSTERIOR CERVICAL FUSION/FORAMINOTOMY N/A 05/02/2013   Procedure: POSTERIOR CERVICAL FUSION/FORAMINOTOMY CERVICAL SEVEN THORACIC-ONE;  Surgeon: Eustace Moore, MD;  Location: [place] [facility];  Service: Neurosurgery;  Laterality: N/A;  POSTERIOR CERVICAL FUSION/FORAMINOTOMY CERVICAL SEVEN THORACIC-ONE   . SPHINCTEROTOMY N/A 06/30/2015   Procedure: SPHINCTEROTOMY;  Surgeon: Ladene Artist, MD;  Location: [facility];  Service: Endoscopy;  Laterality: N/A;  . TOTAL KNEE ARTHROPLASTY Right   . TRANSTHORACIC ECHOCARDIOGRAM  December 2013; October 2016   a. EF 55-60%. mod LA dilation. Aortic Sclerosis;; b. EF 55-60%, Gr 1 DD, Mild-Mod AS (Peark - Mean Gradient 18-10 mmHg)  . TRANSTHORACIC ECHOCARDIOGRAM  07/2016   EF 45-50%. Inferolateral hypokinesis (correlates with scar noted on Myoview). GR 1 DD. Mild aortic stenosis. Mod LAE.  Marland Kitchen VISCERAL ANGIOGRAM  11/03/2016   Procedure: Visceral Angiogram;  Surgeon: Waynetta Sandy, MD;  Location: [facility];  Service: Cardiovascular;;  MEDICATIONS:   No prescriptions prior to admission.    ALLERGIES:   Allergies  Allergen Reactions  . Lisinopril Cough  . Statins Other (See Comments)    MYALGIAS Muscle ache  . Welchol [Colesevelam Hcl] Itching    REVIEW OF SYSTEMS:  A comprehensive review of systems was negative except for: Musculoskeletal: positive for arthralgias and bone pain   FAMILY HISTORY:   Family History  Problem Relation Age of Onset  . Arthritis Mother   . Diabetes Father   . Heart disease Father   . Stroke Sister   . Hypertension Sister   . Heart disease Sister   . Diabetes Sister   . Breast cancer Sister   . Heart disease Brother   . Ulcers Brother   . Colon cancer Neg Hx     SOCIAL HISTORY:   Social History  Substance Use Topics  . Smoking status: Former Smoker    Types: Cigarettes    Quit date: 10/05/1983  . Smokeless tobacco: Never Used  . Alcohol use 0.6 oz/week    1 Glasses of wine per week     Comment:  4 oz. wine daily     EXAMINATION:  Vital signs in last 24 hours:    There were no vitals taken for this visit.  General Appearance:    Alert, cooperative, no distress, appears stated age  Head:    Normocephalic, without obvious abnormality, atraumatic  Eyes:    PERRL,  conjunctiva/corneas clear, EOM's intact, fundi    benign, both eyes       Ears:    Normal TM's and external ear canals, both ears  Nose:   Nares normal, septum midline, mucosa normal, no drainage    or sinus tenderness  Throat:   Lips, mucosa, and tongue normal; teeth and gums normal  Neck:   Supple, symmetrical, trachea midline, no adenopathy;       thyroid:  No enlargement/tenderness/nodules; no carotid   bruit or JVD  Back:     Symmetric, no curvature, ROM normal, no CVA tenderness  Lungs:     Clear to auscultation bilaterally, respirations unlabored  Chest wall:    No tenderness or deformity  Heart:    Regular rate and rhythm, S1 and S2 normal, no murmur, rub   or gallop  Abdomen:     Soft, non-tender, bowel sounds active all four quadrants,    no masses, no organomegaly  Genitalia:    Normal male without lesion, discharge or tenderness  Rectal:    Normal tone, normal prostate, no masses or tenderness;   guaiac negative stool  Extremities:   Extremities normal, atraumatic, no cyanosis or edema  Pulses:   2+ and symmetric all extremities  Skin:   Skin color, texture, turgor normal, no rashes or lesions  Lymph nodes:   Cervical, supraclavicular, and axillary nodes normal  Neurologic:   CNII-XII intact. Normal strength, sensation and reflexes      throughout    Musculoskeletal:  ROM 0-120, Ligaments intact,  Imaging Review Plain radiographs demonstrate severe degenerative joint disease of the left knee. The overall alignment is neutral. The bone quality appears to be excellent for age and reported activity level.  Assessment/Plan: Primary osteoarthritis, left knee   The patient history, physical examination and imaging studies are consistent with advanced degenerative joint disease of the left knee. The patient has failed conservative treatment.  The clearance notes were reviewed.  After discussion with the patient it was felt that Total Knee Replacement  was indicated. The procedure,   risks, and benefits of total knee arthroplasty were presented and reviewed. The risks including but not limited to aseptic loosening, infection, blood clots, vascular injury, stiffness, patella tracking problems complications among others were discussed. The patient acknowledged the explanation, agreed to proceed with the plan.  Donia Ast 12/19/2016, 9:04 PM

## 2016-12-20 ENCOUNTER — Observation Stay (HOSPITAL_COMMUNITY)
Admission: RE | Admit: 2016-12-20 | Discharge: 2016-12-21 | Disposition: A | Discharge status: Home / Self Care | Payer: Medicare Other | Source: Ambulatory Visit | Attending: Orthopedic Surgery | Admitting: Orthopedic Surgery

## 2016-12-20 ENCOUNTER — Encounter (HOSPITAL_COMMUNITY)
Admission: RE | Disposition: A | Discharge status: Home / Self Care | Payer: Self-pay | Source: Ambulatory Visit | Attending: Orthopedic Surgery

## 2016-12-20 ENCOUNTER — Encounter (HOSPITAL_COMMUNITY): Payer: Self-pay | Admitting: *Deleted

## 2016-12-20 ENCOUNTER — Ambulatory Visit (HOSPITAL_COMMUNITY): Payer: Medicare Other | Admitting: Anesthesiology

## 2016-12-20 ENCOUNTER — Ambulatory Visit (HOSPITAL_COMMUNITY): Payer: Medicare Other | Admitting: Vascular Surgery

## 2016-12-20 DIAGNOSIS — T82330A Leakage of aortic (bifurcation) graft (replacement), initial encounter: Secondary | ICD-10-CM

## 2016-12-20 DIAGNOSIS — I130 Hypertensive heart and chronic kidney disease with heart failure and stage 1 through stage 4 chronic kidney disease, or unspecified chronic kidney disease: Secondary | ICD-10-CM | POA: Diagnosis not present

## 2016-12-20 DIAGNOSIS — K219 Gastro-esophageal reflux disease without esophagitis: Secondary | ICD-10-CM | POA: Insufficient documentation

## 2016-12-20 DIAGNOSIS — I493 Ventricular premature depolarization: Secondary | ICD-10-CM | POA: Insufficient documentation

## 2016-12-20 DIAGNOSIS — D631 Anemia in chronic kidney disease: Secondary | ICD-10-CM | POA: Diagnosis not present

## 2016-12-20 DIAGNOSIS — M549 Dorsalgia, unspecified: Secondary | ICD-10-CM | POA: Insufficient documentation

## 2016-12-20 DIAGNOSIS — I252 Old myocardial infarction: Secondary | ICD-10-CM | POA: Insufficient documentation

## 2016-12-20 DIAGNOSIS — IMO0002 Reserved for concepts with insufficient information to code with codable children: Secondary | ICD-10-CM

## 2016-12-20 DIAGNOSIS — Z955 Presence of coronary angioplasty implant and graft: Secondary | ICD-10-CM | POA: Insufficient documentation

## 2016-12-20 DIAGNOSIS — J45909 Unspecified asthma, uncomplicated: Secondary | ICD-10-CM | POA: Insufficient documentation

## 2016-12-20 DIAGNOSIS — E1122 Type 2 diabetes mellitus with diabetic chronic kidney disease: Secondary | ICD-10-CM | POA: Diagnosis not present

## 2016-12-20 DIAGNOSIS — I25810 Atherosclerosis of coronary artery bypass graft(s) without angina pectoris: Secondary | ICD-10-CM | POA: Insufficient documentation

## 2016-12-20 DIAGNOSIS — M25762 Osteophyte, left knee: Secondary | ICD-10-CM | POA: Insufficient documentation

## 2016-12-20 DIAGNOSIS — M1990 Unspecified osteoarthritis, unspecified site: Secondary | ICD-10-CM | POA: Insufficient documentation

## 2016-12-20 DIAGNOSIS — Z7982 Long term (current) use of aspirin: Secondary | ICD-10-CM | POA: Insufficient documentation

## 2016-12-20 DIAGNOSIS — M1712 Unilateral primary osteoarthritis, left knee: Secondary | ICD-10-CM | POA: Diagnosis not present

## 2016-12-20 DIAGNOSIS — I491 Atrial premature depolarization: Secondary | ICD-10-CM | POA: Diagnosis not present

## 2016-12-20 DIAGNOSIS — I350 Nonrheumatic aortic (valve) stenosis: Secondary | ICD-10-CM | POA: Diagnosis not present

## 2016-12-20 DIAGNOSIS — G8918 Other acute postprocedural pain: Secondary | ICD-10-CM | POA: Diagnosis not present

## 2016-12-20 DIAGNOSIS — I2582 Chronic total occlusion of coronary artery: Secondary | ICD-10-CM | POA: Insufficient documentation

## 2016-12-20 DIAGNOSIS — Z9849 Cataract extraction status, unspecified eye: Secondary | ICD-10-CM | POA: Insufficient documentation

## 2016-12-20 DIAGNOSIS — Z79899 Other long term (current) drug therapy: Secondary | ICD-10-CM | POA: Insufficient documentation

## 2016-12-20 DIAGNOSIS — R0600 Dyspnea, unspecified: Secondary | ICD-10-CM | POA: Diagnosis not present

## 2016-12-20 DIAGNOSIS — Z803 Family history of malignant neoplasm of breast: Secondary | ICD-10-CM | POA: Insufficient documentation

## 2016-12-20 DIAGNOSIS — G8929 Other chronic pain: Secondary | ICD-10-CM | POA: Diagnosis not present

## 2016-12-20 DIAGNOSIS — Z8379 Family history of other diseases of the digestive system: Secondary | ICD-10-CM | POA: Insufficient documentation

## 2016-12-20 DIAGNOSIS — I5032 Chronic diastolic (congestive) heart failure: Secondary | ICD-10-CM | POA: Insufficient documentation

## 2016-12-20 DIAGNOSIS — Z9049 Acquired absence of other specified parts of digestive tract: Secondary | ICD-10-CM | POA: Insufficient documentation

## 2016-12-20 DIAGNOSIS — Z96651 Presence of right artificial knee joint: Secondary | ICD-10-CM | POA: Insufficient documentation

## 2016-12-20 DIAGNOSIS — I700 Atherosclerosis of aorta: Secondary | ICD-10-CM | POA: Insufficient documentation

## 2016-12-20 DIAGNOSIS — E669 Obesity, unspecified: Secondary | ICD-10-CM | POA: Insufficient documentation

## 2016-12-20 DIAGNOSIS — K5732 Diverticulitis of large intestine without perforation or abscess without bleeding: Secondary | ICD-10-CM | POA: Insufficient documentation

## 2016-12-20 DIAGNOSIS — Z951 Presence of aortocoronary bypass graft: Secondary | ICD-10-CM | POA: Diagnosis not present

## 2016-12-20 DIAGNOSIS — Z823 Family history of stroke: Secondary | ICD-10-CM | POA: Insufficient documentation

## 2016-12-20 DIAGNOSIS — G4730 Sleep apnea, unspecified: Secondary | ICD-10-CM | POA: Insufficient documentation

## 2016-12-20 DIAGNOSIS — Z6832 Body mass index (BMI) 32.0-32.9, adult: Secondary | ICD-10-CM | POA: Insufficient documentation

## 2016-12-20 DIAGNOSIS — E785 Hyperlipidemia, unspecified: Secondary | ICD-10-CM | POA: Diagnosis not present

## 2016-12-20 DIAGNOSIS — I2510 Atherosclerotic heart disease of native coronary artery without angina pectoris: Secondary | ICD-10-CM | POA: Diagnosis not present

## 2016-12-20 DIAGNOSIS — N183 Chronic kidney disease, stage 3 (moderate): Secondary | ICD-10-CM | POA: Insufficient documentation

## 2016-12-20 DIAGNOSIS — I714 Abdominal aortic aneurysm, without rupture: Secondary | ICD-10-CM | POA: Insufficient documentation

## 2016-12-20 DIAGNOSIS — I739 Peripheral vascular disease, unspecified: Secondary | ICD-10-CM | POA: Insufficient documentation

## 2016-12-20 DIAGNOSIS — Z87891 Personal history of nicotine dependence: Secondary | ICD-10-CM | POA: Insufficient documentation

## 2016-12-20 DIAGNOSIS — K449 Diaphragmatic hernia without obstruction or gangrene: Secondary | ICD-10-CM | POA: Diagnosis not present

## 2016-12-20 DIAGNOSIS — Z8261 Family history of arthritis: Secondary | ICD-10-CM | POA: Insufficient documentation

## 2016-12-20 DIAGNOSIS — Z8249 Family history of ischemic heart disease and other diseases of the circulatory system: Secondary | ICD-10-CM | POA: Insufficient documentation

## 2016-12-20 DIAGNOSIS — Z96659 Presence of unspecified artificial knee joint: Secondary | ICD-10-CM

## 2016-12-20 DIAGNOSIS — Z981 Arthrodesis status: Secondary | ICD-10-CM | POA: Insufficient documentation

## 2016-12-20 DIAGNOSIS — Z888 Allergy status to other drugs, medicaments and biological substances status: Secondary | ICD-10-CM | POA: Insufficient documentation

## 2016-12-20 HISTORY — PX: TOTAL KNEE ARTHROPLASTY: SHX125

## 2016-12-20 LAB — GLUCOSE, CAPILLARY
GLUCOSE-CAPILLARY: 107 mg/dL — AB (ref 65–99)
Glucose-Capillary: 89 mg/dL (ref 65–99)
Glucose-Capillary: 120 mg/dL — ABNORMAL HIGH (ref 65–99)
Glucose-Capillary: 182 mg/dL — ABNORMAL HIGH (ref 65–99)

## 2016-12-20 SURGERY — ARTHROPLASTY, KNEE, TOTAL
Anesthesia: Spinal | Site: Knee | Laterality: Left

## 2016-12-20 MED ORDER — LIDOCAINE HCL (CARDIAC) 20 MG/ML IV SOLN
INTRAVENOUS | Status: DC | PRN
  Administered 2016-12-20: 50 mg via INTRATRACHEAL

## 2016-12-20 MED ORDER — PROMETHAZINE HCL 25 MG/ML IJ SOLN: 6.2500 mg | INTRAMUSCULAR | Status: DC | PRN

## 2016-12-20 MED ORDER — BUPIVACAINE HCL (PF) 0.75 % IJ SOLN
INTRAMUSCULAR | Status: DC | PRN
  Administered 2016-12-20: 15 mg via INTRATHECAL

## 2016-12-20 MED ORDER — ASPIRIN EC 325 MG PO TBEC
325.0000 mg | DELAYED_RELEASE_TABLET | Freq: Every day | ORAL | Status: DC
  Administered 2016-12-21: 325 mg via ORAL
  Filled 2016-12-20: qty 1

## 2016-12-20 MED ORDER — EPHEDRINE SULFATE 50 MG/ML IJ SOLN
INTRAMUSCULAR | Status: DC | PRN
  Administered 2016-12-20 (×5): 5 mg via INTRAVENOUS

## 2016-12-20 MED ORDER — MIDAZOLAM HCL 2 MG/2ML IJ SOLN
INTRAMUSCULAR | Status: AC
  Filled 2016-12-20: qty 2

## 2016-12-20 MED ORDER — SODIUM CHLORIDE 0.9 % IR SOLN
Status: DC | PRN
  Administered 2016-12-20: 3000 mL

## 2016-12-20 MED ORDER — ALBUTEROL SULFATE (2.5 MG/3ML) 0.083% IN NEBU: 3.0000 mL | INHALATION_SOLUTION | Freq: Four times a day (QID) | RESPIRATORY_TRACT | Status: DC | PRN

## 2016-12-20 MED ORDER — MIDAZOLAM HCL 2 MG/2ML IJ SOLN
INTRAMUSCULAR | Status: AC
  Administered 2016-12-20: 2 mg
  Filled 2016-12-20: qty 2

## 2016-12-20 MED ORDER — SODIUM CHLORIDE 0.9 % IJ SOLN
INTRAMUSCULAR | Status: DC | PRN
  Administered 2016-12-20: 20 mL

## 2016-12-20 MED ORDER — BUPIVACAINE HCL (PF) 0.25 % IJ SOLN
INTRAMUSCULAR | Status: AC
  Filled 2016-12-20: qty 30

## 2016-12-20 MED ORDER — GABAPENTIN 300 MG PO CAPS
300.0000 mg | ORAL_CAPSULE | Freq: Three times a day (TID) | ORAL | Status: DC
  Administered 2016-12-20 – 2016-12-21 (×3): 300 mg via ORAL
  Filled 2016-12-20 (×3): qty 1

## 2016-12-20 MED ORDER — HYDROMORPHONE HCL 1 MG/ML IJ SOLN
INTRAMUSCULAR | Status: AC
  Filled 2016-12-20: qty 0.5

## 2016-12-20 MED ORDER — TRANEXAMIC ACID 1000 MG/10ML IV SOLN
1000.0000 mg | Freq: Once | INTRAVENOUS | Status: AC
  Administered 2016-12-20: 1000 mg via INTRAVENOUS
  Filled 2016-12-20: qty 10

## 2016-12-20 MED ORDER — AMLODIPINE BESYLATE 5 MG PO TABS
5.0000 mg | ORAL_TABLET | Freq: Every day | ORAL | Status: DC
  Administered 2016-12-21: 5 mg via ORAL
  Filled 2016-12-20: qty 1

## 2016-12-20 MED ORDER — SODIUM CHLORIDE 0.9 % IV SOLN: INTRAVENOUS | Status: DC

## 2016-12-20 MED ORDER — DOCUSATE SODIUM 100 MG PO CAPS
100.0000 mg | ORAL_CAPSULE | Freq: Two times a day (BID) | ORAL | Status: DC
  Administered 2016-12-20 – 2016-12-21 (×3): 100 mg via ORAL
  Filled 2016-12-20 (×3): qty 1

## 2016-12-20 MED ORDER — HYDROCODONE-ACETAMINOPHEN 7.5-325 MG PO TABS
1.0000 | ORAL_TABLET | Freq: Four times a day (QID) | ORAL | Status: AC
  Administered 2016-12-20 – 2016-12-21 (×3): 1 via ORAL
  Filled 2016-12-20 (×4): qty 1

## 2016-12-20 MED ORDER — METOCLOPRAMIDE HCL 5 MG PO TABS: 5.0000 mg | ORAL_TABLET | Freq: Three times a day (TID) | ORAL | Status: DC | PRN

## 2016-12-20 MED ORDER — ACETAMINOPHEN 650 MG RE SUPP: 650.0000 mg | Freq: Four times a day (QID) | RECTAL | Status: DC | PRN

## 2016-12-20 MED ORDER — DEXAMETHASONE SODIUM PHOSPHATE 10 MG/ML IJ SOLN
10.0000 mg | Freq: Once | INTRAMUSCULAR | Status: AC
  Administered 2016-12-21: 10 mg via INTRAVENOUS
  Filled 2016-12-20: qty 1

## 2016-12-20 MED ORDER — PIOGLITAZONE HCL-METFORMIN HCL 15-500 MG PO TABS: 1.0000 | ORAL_TABLET | Freq: Every day | ORAL | Status: DC

## 2016-12-20 MED ORDER — PROPOFOL 10 MG/ML IV BOLUS
INTRAVENOUS | Status: AC
  Filled 2016-12-20: qty 20

## 2016-12-20 MED ORDER — ACETAMINOPHEN 325 MG PO TABS: 650.0000 mg | ORAL_TABLET | Freq: Four times a day (QID) | ORAL | Status: DC | PRN

## 2016-12-20 MED ORDER — OXYCODONE HCL 5 MG PO TABS
ORAL_TABLET | ORAL | Status: AC
  Filled 2016-12-20: qty 2

## 2016-12-20 MED ORDER — SENNOSIDES-DOCUSATE SODIUM 8.6-50 MG PO TABS: 1.0000 | ORAL_TABLET | Freq: Every evening | ORAL | Status: DC | PRN

## 2016-12-20 MED ORDER — HYDROCODONE-ACETAMINOPHEN 7.5-325 MG PO TABS
1.0000 | ORAL_TABLET | Freq: Four times a day (QID) | ORAL | Status: DC | PRN
  Administered 2016-12-20: 1 via ORAL

## 2016-12-20 MED ORDER — HYDROMORPHONE HCL 2 MG/ML IJ SOLN
1.0000 mg | INTRAMUSCULAR | Status: DC | PRN
  Administered 2016-12-20 – 2016-12-21 (×3): 1 mg via INTRAVENOUS
  Filled 2016-12-20 (×3): qty 1

## 2016-12-20 MED ORDER — PHENOL 1.4 % MT LIQD: 1.0000 | OROMUCOSAL | Status: DC | PRN

## 2016-12-20 MED ORDER — PROPOFOL 500 MG/50ML IV EMUL
INTRAVENOUS | Status: DC | PRN
  Administered 2016-12-20: 50 ug/kg/min via INTRAVENOUS

## 2016-12-20 MED ORDER — CLOPIDOGREL BISULFATE 75 MG PO TABS
75.0000 mg | ORAL_TABLET | Freq: Every day | ORAL | Status: DC
  Administered 2016-12-20 – 2016-12-21 (×2): 75 mg via ORAL
  Filled 2016-12-20 (×2): qty 1

## 2016-12-20 MED ORDER — MIDAZOLAM HCL 5 MG/5ML IJ SOLN
INTRAMUSCULAR | Status: DC | PRN
  Administered 2016-12-20: 1 mg via INTRAVENOUS

## 2016-12-20 MED ORDER — PANTOPRAZOLE SODIUM 40 MG PO TBEC
40.0000 mg | DELAYED_RELEASE_TABLET | Freq: Every day | ORAL | Status: DC
Start: 2016-12-20 — End: 2016-12-21
  Administered 2016-12-20 – 2016-12-21 (×2): 40 mg via ORAL
  Filled 2016-12-20 (×2): qty 1

## 2016-12-20 MED ORDER — FENTANYL CITRATE (PF) 100 MCG/2ML IJ SOLN
INTRAMUSCULAR | Status: AC
Start: 2016-12-20 — End: 2016-12-20
  Filled 2016-12-20: qty 2

## 2016-12-20 MED ORDER — OXYCODONE HCL 5 MG/5ML PO SOLN: 5.0000 mg | Freq: Once | ORAL | Status: AC | PRN

## 2016-12-20 MED ORDER — METOCLOPRAMIDE HCL 5 MG/ML IJ SOLN: 5.0000 mg | Freq: Three times a day (TID) | INTRAMUSCULAR | Status: DC | PRN

## 2016-12-20 MED ORDER — HYDROMORPHONE HCL 1 MG/ML IJ SOLN
0.2500 mg | INTRAMUSCULAR | Status: DC | PRN
  Administered 2016-12-20 (×3): 0.5 mg via INTRAVENOUS

## 2016-12-20 MED ORDER — EPHEDRINE 5 MG/ML INJ
INTRAVENOUS | Status: AC
  Filled 2016-12-20: qty 10

## 2016-12-20 MED ORDER — DIPHENHYDRAMINE HCL 12.5 MG/5ML PO ELIX: 12.5000 mg | ORAL_SOLUTION | ORAL | Status: DC | PRN

## 2016-12-20 MED ORDER — ZOLPIDEM TARTRATE 5 MG PO TABS: 5.0000 mg | ORAL_TABLET | Freq: Every evening | ORAL | Status: DC | PRN

## 2016-12-20 MED ORDER — INSULIN ASPART 100 UNIT/ML ~~LOC~~ SOLN
0.0000 [IU] | Freq: Three times a day (TID) | SUBCUTANEOUS | Status: DC
  Administered 2016-12-21: 2 [IU] via SUBCUTANEOUS

## 2016-12-20 MED ORDER — BISACODYL 5 MG PO TBEC: 5.0000 mg | DELAYED_RELEASE_TABLET | Freq: Every day | ORAL | Status: DC | PRN

## 2016-12-20 MED ORDER — HYDROCODONE-ACETAMINOPHEN 7.5-325 MG PO TABS: 1.0000 | ORAL_TABLET | Freq: Four times a day (QID) | ORAL | Status: DC

## 2016-12-20 MED ORDER — CELECOXIB 200 MG PO CAPS
200.0000 mg | ORAL_CAPSULE | Freq: Two times a day (BID) | ORAL | Status: DC
  Administered 2016-12-20 – 2016-12-21 (×3): 200 mg via ORAL
  Filled 2016-12-20 (×3): qty 1

## 2016-12-20 MED ORDER — FLEET ENEMA 7-19 GM/118ML RE ENEM: 1.0000 | ENEMA | Freq: Once | RECTAL | Status: DC | PRN

## 2016-12-20 MED ORDER — LOSARTAN POTASSIUM 50 MG PO TABS
100.0000 mg | ORAL_TABLET | Freq: Every day | ORAL | Status: DC
  Administered 2016-12-20 – 2016-12-21 (×2): 100 mg via ORAL
  Filled 2016-12-20 (×2): qty 2

## 2016-12-20 MED ORDER — ONDANSETRON HCL 4 MG PO TABS: 4.0000 mg | ORAL_TABLET | Freq: Four times a day (QID) | ORAL | Status: DC | PRN

## 2016-12-20 MED ORDER — FENTANYL CITRATE (PF) 100 MCG/2ML IJ SOLN
INTRAMUSCULAR | Status: AC
  Administered 2016-12-20: 50 ug
  Filled 2016-12-20: qty 2

## 2016-12-20 MED ORDER — MENTHOL 3 MG MT LOZG
1.0000 | LOZENGE | OROMUCOSAL | Status: DC | PRN
  Administered 2016-12-20: 3 mg via ORAL
  Filled 2016-12-20: qty 9

## 2016-12-20 MED ORDER — METHOCARBAMOL 500 MG PO TABS
500.0000 mg | ORAL_TABLET | Freq: Four times a day (QID) | ORAL | Status: DC | PRN
  Administered 2016-12-20 – 2016-12-21 (×2): 500 mg via ORAL
  Filled 2016-12-20 (×2): qty 1

## 2016-12-20 MED ORDER — ALUM & MAG HYDROXIDE-SIMETH 200-200-20 MG/5ML PO SUSP: 30.0000 mL | ORAL | Status: DC | PRN

## 2016-12-20 MED ORDER — METFORMIN HCL 500 MG PO TABS
500.0000 mg | ORAL_TABLET | Freq: Every day | ORAL | Status: DC
  Administered 2016-12-21: 500 mg via ORAL
  Filled 2016-12-20: qty 1

## 2016-12-20 MED ORDER — BUPIVACAINE-EPINEPHRINE (PF) 0.25% -1:200000 IJ SOLN
INTRAMUSCULAR | Status: DC | PRN
  Administered 2016-12-20: 30 mL

## 2016-12-20 MED ORDER — LACTATED RINGERS IV SOLN
INTRAVENOUS | Status: DC
  Administered 2016-12-20 (×2): via INTRAVENOUS

## 2016-12-20 MED ORDER — OXYCODONE HCL 5 MG PO TABS
5.0000 mg | ORAL_TABLET | ORAL | Status: DC | PRN
  Administered 2016-12-20 – 2016-12-21 (×3): 10 mg via ORAL
  Filled 2016-12-20 (×2): qty 2

## 2016-12-20 MED ORDER — METHOCARBAMOL 1000 MG/10ML IJ SOLN
500.0000 mg | Freq: Four times a day (QID) | INTRAVENOUS | Status: DC | PRN
  Filled 2016-12-20: qty 5

## 2016-12-20 MED ORDER — FENTANYL CITRATE (PF) 100 MCG/2ML IJ SOLN
INTRAMUSCULAR | Status: DC | PRN
  Administered 2016-12-20: 50 ug via INTRAVENOUS

## 2016-12-20 MED ORDER — CEFAZOLIN IN D5W 1 GM/50ML IV SOLN
1.0000 g | Freq: Four times a day (QID) | INTRAVENOUS | Status: AC
  Administered 2016-12-20 (×2): 1 g via INTRAVENOUS
  Filled 2016-12-20 (×3): qty 50

## 2016-12-20 MED ORDER — OXYCODONE HCL 5 MG PO TABS
5.0000 mg | ORAL_TABLET | Freq: Once | ORAL | Status: AC | PRN
  Administered 2016-12-20: 5 mg via ORAL

## 2016-12-20 MED ORDER — ONDANSETRON HCL 4 MG/2ML IJ SOLN: 4.0000 mg | Freq: Four times a day (QID) | INTRAMUSCULAR | Status: DC | PRN

## 2016-12-20 MED ORDER — PIOGLITAZONE HCL 15 MG PO TABS
15.0000 mg | ORAL_TABLET | Freq: Every day | ORAL | Status: DC
  Administered 2016-12-21: 15 mg via ORAL
  Filled 2016-12-20: qty 1

## 2016-12-20 SURGICAL SUPPLY — 59 items
BANDAGE ELASTIC 6 VELCRO ST LF (GAUZE/BANDAGES/DRESSINGS) ×2 IMPLANT
BANDAGE ESMARK 6X9 LF (GAUZE/BANDAGES/DRESSINGS) ×1 IMPLANT
BLADE SAGITTAL 13X1.27X60 (BLADE) ×2 IMPLANT
BLADE SAGITTAL 13X1.27X60MM (BLADE) ×1
BLADE SAW SGTL 83.5X18.5 (BLADE) ×3 IMPLANT
BNDG CMPR 9X6 STRL LF SNTH (GAUZE/BANDAGES/DRESSINGS) ×1
BNDG CMPR MED 10X6 ELC LF (GAUZE/BANDAGES/DRESSINGS) ×1
BNDG ELASTIC 6X10 VLCR STRL LF (GAUZE/BANDAGES/DRESSINGS) ×2 IMPLANT
BNDG ESMARK 6X9 LF (GAUZE/BANDAGES/DRESSINGS) ×3
BOWL SMART MIX CTS (DISPOSABLE) ×3 IMPLANT
CAPT KNEE TOTAL 3 ×3 IMPLANT
CEMENT BONE SIMPLEX SPEEDSET (Cement) ×6 IMPLANT
CLOSURE WOUND 1/2 X4 (GAUZE/BANDAGES/DRESSINGS) ×1
COVER SURGICAL LIGHT HANDLE (MISCELLANEOUS) ×3 IMPLANT
CUFF TOURNIQUET SINGLE 34IN LL (TOURNIQUET CUFF) ×3 IMPLANT
DRAPE EXTREMITY T 121X128X90 (DRAPE) ×3 IMPLANT
DRAPE INCISE IOBAN 66X45 STRL (DRAPES) ×6 IMPLANT
DRAPE U-SHAPE 47X51 STRL (DRAPES) ×3 IMPLANT
DRSG AQUACEL AG ADV 3.5X10 (GAUZE/BANDAGES/DRESSINGS) ×3 IMPLANT
DURAPREP 26ML APPLICATOR (WOUND CARE) ×6 IMPLANT
ELECT REM PT RETURN 9FT ADLT (ELECTROSURGICAL) ×3
ELECTRODE REM PT RTRN 9FT ADLT (ELECTROSURGICAL) ×1 IMPLANT
FILTER STRAW FLUID ASPIR (MISCELLANEOUS) ×2 IMPLANT
GLOVE BIOGEL PI IND STRL 8.5 (GLOVE) ×1 IMPLANT
GLOVE BIOGEL PI INDICATOR 8.5 (GLOVE) ×2
GLOVE SURG ORTHO 8.0 STRL STRW (GLOVE) ×6 IMPLANT
GOWN STRL REUS W/ TWL LRG LVL3 (GOWN DISPOSABLE) ×1 IMPLANT
GOWN STRL REUS W/ TWL XL LVL3 (GOWN DISPOSABLE) ×2 IMPLANT
GOWN STRL REUS W/TWL 2XL LVL3 (GOWN DISPOSABLE) ×3 IMPLANT
GOWN STRL REUS W/TWL LRG LVL3 (GOWN DISPOSABLE) ×3
GOWN STRL REUS W/TWL XL LVL3 (GOWN DISPOSABLE) ×6
HANDPIECE INTERPULSE COAX TIP (DISPOSABLE) ×3
HOOD PEEL AWAY FACE SHEILD DIS (HOOD) ×9 IMPLANT
KIT BASIN OR (CUSTOM PROCEDURE TRAY) ×3 IMPLANT
KIT ROOM TURNOVER OR (KITS) ×3 IMPLANT
KNEE CAPITATED TOTAL 3 IMPLANT
MANIFOLD NEPTUNE II (INSTRUMENTS) ×3 IMPLANT
NDL 18GX1X1/2 (RX/OR ONLY) (NEEDLE) IMPLANT
NEEDLE 18GX1X1/2 (RX/OR ONLY) (NEEDLE) ×3 IMPLANT
NEEDLE 22X1 1/2 (OR ONLY) (NEEDLE) ×6 IMPLANT
NS IRRIG 1000ML POUR BTL (IV SOLUTION) ×3 IMPLANT
PACK TOTAL JOINT (CUSTOM PROCEDURE TRAY) ×3 IMPLANT
PAD ARMBOARD 7.5X6 YLW CONV (MISCELLANEOUS) ×6 IMPLANT
SET HNDPC FAN SPRY TIP SCT (DISPOSABLE) ×1 IMPLANT
STRIP CLOSURE SKIN 1/2X4 (GAUZE/BANDAGES/DRESSINGS) ×2 IMPLANT
SUCTION FRAZIER HANDLE 10FR (MISCELLANEOUS) ×2
SUCTION TUBE FRAZIER 10FR DISP (MISCELLANEOUS) IMPLANT
SUT MNCRL AB 3-0 PS2 18 (SUTURE) ×3 IMPLANT
SUT VIC AB 0 CTB1 27 (SUTURE) ×6 IMPLANT
SUT VIC AB 1 CT1 27 (SUTURE) ×6
SUT VIC AB 1 CT1 27XBRD ANBCTR (SUTURE) ×2 IMPLANT
SUT VIC AB 2-0 CT1 27 (SUTURE) ×6
SUT VIC AB 2-0 CT1 TAPERPNT 27 (SUTURE) ×2 IMPLANT
SYR 20CC LL (SYRINGE) ×6 IMPLANT
SYR TB 1ML LUER SLIP (SYRINGE) ×2 IMPLANT
TOWEL OR 17X24 6PK STRL BLUE (TOWEL DISPOSABLE) ×3 IMPLANT
TOWEL OR 17X26 10 PK STRL BLUE (TOWEL DISPOSABLE) ×3 IMPLANT
TRAY CATH 16FR W/PLASTIC CATH (SET/KITS/TRAYS/PACK) IMPLANT
WRAP KNEE MAXI GEL POST OP (GAUZE/BANDAGES/DRESSINGS) ×3 IMPLANT

## 2016-12-20 NOTE — Anesthesia Preprocedure Evaluation (Addendum)
Anesthesia Evaluation  Patient identified by MRN, date of birth, ID band Patient awake  General Assessment Comment:Clear liquids @ 1230  Reviewed: Allergy & Precautions, H&P , NPO status , Patient's Chart, lab work & pertinent test results, reviewed documented beta blocker date and time   History of Anesthesia Complications Negative for: history of anesthetic complications  Airway Mallampati: I  TM Distance: >3 FB Neck ROM: Full    Dental  (+) Partial Upper, Dental Advisory Given, Partial Lower, Missing,    Pulmonary shortness of breath and with exertion, sleep apnea and Continuous Positive Airway Pressure Ventilation , former smoker,    Pulmonary exam normal breath sounds clear to auscultation       Cardiovascular Exercise Tolerance: Poor hypertension, Pt. on medications and Pt. on home beta blockers + CAD, + Past MI, + Cardiac Stents, + Peripheral Vascular Disease and +CHF  Normal cardiovascular exam Rhythm:Regular Rate:Normal  CAD (S/p CABG (LIMA-LAD, SVG-OM), s/p multiple stents/PCI), HTN, hyperlipidemia, AAA  Echo 07/29/15:  - Left ventricle: The cavity size was normal. Wall thickness was normal. Systolic function was normal. The estimated ejection fraction was in the range of 55% to 60%. Wall motion was normal; there were no regional wall motion abnormalities. Doppler parameters are consistent with abnormal left ventricular relaxation (grade 1 diastolic dysfunction). The E/e&' ratio is >15, suggesting elevated LV filling pressure. - Aortic valve: Trileaflet; mildly calcified leaflets. Mean gradient (S): 10 mm Hg. Peak gradient (S): 18 mm Hg. Valve area (VTI): 1.42 cm^2. Valve area (Vmax): 1.51 cm^2. Valve area (Vmean): 1.47 cm^2. - Mitral valve: Calcified annulus. There was mild regurgitation. - Left atrium: Moderately dilated at 45 ml/m2. - Right atrium: The atrium was mildly dilated. - Tricuspid valve: There was mild  regurgitation. - Pulmonary arteries: Dilated. PA peak pressure: 30 mm Hg (S). - Inferior vena cava: The vessel was normal in size. The respirophasic diameter changes were in the normal range (= 50%), consistent with normal central venous pressure. - Impressions: Compared to a prior echo in 2013, there is now mild to moderateaortic stenosis - AVA around 1.5 cm2.   Neuro/Psych negative neurological ROS  negative psych ROS   GI/Hepatic Neg liver ROS, GERD  Medicated and Controlled,  Endo/Other  diabetes, Well Controlled, Type 2, Oral Hypoglycemic Agents  Renal/GU Renal InsufficiencyRenal disease     Musculoskeletal  (+) Arthritis , Osteoarthritis,    Abdominal   Peds  Hematology  (+) Blood dyscrasia, anemia ,   Anesthesia Other Findings   Reproductive/Obstetrics                             Anesthesia Physical  Anesthesia Plan  ASA: III  Anesthesia Plan: Spinal   Post-op Pain Management:    Induction: Intravenous  Airway Management Planned: Oral ETT  Additional Equipment:   Intra-op Plan:   Post-operative Plan:   Informed Consent: I have reviewed the patients History and Physical, chart, labs and discussed the procedure including the risks, benefits and alternatives for the proposed anesthesia with the patient or authorized representative who has indicated his/her understanding and acceptance.   Dental advisory given  Plan Discussed with: CRNA  Anesthesia Plan Comments:        Anesthesia Quick Evaluation

## 2016-12-20 NOTE — Addendum Note (Signed)
Addended by: Lianne Cure A on: 12/20/2016 04:50 PM   Modules accepted: Orders

## 2016-12-20 NOTE — Anesthesia Procedure Notes (Signed)
Spinal  Patient location during procedure: OR Start time: 12/20/2016 10:15 AM End time: 12/20/2016 10:20 AM Staffing Anesthesiologist: Candida Peeling RAY Performed: anesthesiologist  Preanesthetic Checklist Completed: patient identified, site marked, surgical consent, pre-op evaluation, timeout performed, IV checked, risks and benefits discussed and monitors and equipment checked Spinal Block Patient position: sitting Prep: Betadine Patient monitoring: heart rate, cardiac monitor, continuous pulse ox and blood pressure Approach: midline Location: L3-4 Injection technique: single-shot Needle Needle type: Quincke  Needle gauge: 22 G Needle length: 9 cm

## 2016-12-20 NOTE — Progress Notes (Signed)
Orthopedic Tech Progress Note Patient Details:  [name] 1936-04-21 [id number]  CPM Left Knee CPM Left Knee: On Left Knee Flexion (Degrees): 90 Left Knee Extension (Degrees): 0 Additional Comments: Applied CPM to pt Left Knee.  Pt tolerated well.  Provided bone foam zero degree (footsie roll) at bedside. Left Knee   Kristopher Oppenheim 12/20/2016, 1:04 PM

## 2016-12-20 NOTE — Op Note (Signed)
TOTAL KNEE REPLACEMENT OPERATIVE NOTE:  12/20/2016  12:17 PM  PATIENT:  [name]  81 y.o. male  PRE-OPERATIVE DIAGNOSIS:  primary osteoarthritis left knee  POST-OPERATIVE DIAGNOSIS:  primary osteoarthritis left knee  PROCEDURE:  Procedure(s): LEFT TOTAL KNEE ARTHROPLASTY  SURGEON:  Surgeon(s): Vickey Huger, MD  PHYSICIAN ASSISTANT: Carlyon Shadow, [facility]  ANESTHESIA:   spinal  DRAINS: Hemovac  SPECIMEN: None  COUNTS:  Correct  TOURNIQUET:   Total Tourniquet Time Documented: Thigh (Left) - 48 minutes Total: Thigh (Left) - 48 minutes   DICTATION:  Indication for procedure:    The patient is a 81 y.o. male who has failed conservative treatment for primary osteoarthritis left knee.  Informed consent was obtained prior to anesthesia. The risks versus benefits of the operation were explain and in a way the patient can, and did, understand.   On the implant demand matching protocol, this patient scored 10.  Therefore, this patient was not receive a polyethylene insert with vitamin E which is a high demand implant.  Description of procedure:     The patient was taken to the operating room and placed under anesthesia.  The patient was positioned in the usual fashion taking care that all body parts were adequately padded and/or protected.  I foley catheter was not placed.  A tourniquet was applied and the leg prepped and draped in the usual sterile fashion.  The extremity was exsanguinated with the esmarch and tourniquet inflated to 350 mmHg.  Pre-operative range of motion was normal.  The knee was in 5 degree of mild varus.  A midline incision approximately 6-7 inches long was made with a #10 blade.  A new blade was used to make a parapatellar arthrotomy going 2-3 cm into the quadriceps tendon, over the patella, and alongside the medial aspect of the patellar tendon.  A synovectomy was then performed with the #10 blade and forceps. I then elevated the deep MCL off the medial  tibial metaphysis subperiosteally around to the semimembranosus attachment.    I everted the patella and used calipers to measure patellar thickness.  I used the reamer to ream down to appropriate thickness to recreate the native thickness.  I then removed excess bone with the rongeur and sagittal saw.  I used the appropriately sized template and drilled the three lug holes.  I then put the trial in place and measured the thickness with the calipers to ensure recreation of the native thickness.  The trial was then removed and the patella subluxed and the knee brought into flexion.  A homan retractor was place to retract and protect the patella and lateral structures.  A Z-retractor was place medially to protect the medial structures.  The extra-medullary alignment system was used to make cut the tibial articular surface perpendicular to the anamotic axis of the tibia and in 3 degrees of posterior slope.  The cut surface and alignment jig was removed.  I then used the intramedullary alignment guide to make a 6 valgus cut on the distal femur.  I then marked out the epicondylar axis on the distal femur.  The posterior condylar axis measured 3 degrees.  I then used the anterior referencing sizer and measured the femur to be a size 9.  The 4-In-1 cutting block was screwed into place in external rotation matching the posterior condylar angle, making our cuts perpendicular to the epicondylar axis.  Anterior, posterior and chamfer cuts were made with the sagittal saw.  The cutting block and cut pieces  were removed.  A lamina spreader was placed in 90 degrees of flexion.  The ACL, PCL, menisci, and posterior condylar osteophytes were removed.  A 67mm spacer blocked was found to offer good flexion and extension gap balance after severe in degree releasing.   The scoop retractor was then placed and the femoral finishing block was pinned in place.  The small sagittal saw was used as well as the lug drill to finish the  femur.  The block and cut surfaces were removed and the medullary canal hole filled with autograft bone from the cut pieces.  The tibia was delivered forward in deep flexion and external rotation.  A size F tray was selected and pinned into place centered on the medial 1/3 of the tibial tubercle.  The reamer and keel was used to prepare the tibia through the tray.    I then trialed with the size 10 femur, size F tibia, a 11 mm insert and the 35 patella.  I had excellent flexion/extension gap balance, excellent patella tracking.  Flexion was full and beyond 120 degrees; extension was zero.  These components were chosen and the staff opened them to me on the back table while the knee was lavaged copiously and the cement mixed.  The soft tissue was infiltrated with 60cc of exparel 1.3% through a 21 gauge needle.  I cemented in the components and removed all excess cement.  The polyethylene tibial component was snapped into place and the knee placed in extension while cement was hardening.  The capsule was infilltrated with 30cc of .25% Marcaine with epinephrine.  A hemovac was place in the joint exiting superolaterally.  A pain pump was place superomedially superficial to the arthrotomy.  Once the cement was hard, the tourniquet was let down.  Hemostasis was obtained.  The arthrotomy was closed with figure-8 #1 vicryl sutures.  The deep soft tissues were closed with #0 vicryls and the subcuticular layer closed with a running #2-0 vicryl.  The skin was reapproximated and closed with skin staples.  The wound was dressed with xeroform, 4 x4's, 2 ABD sponges, a single layer of webril and a TED stocking.   The patient was then awakened, extubated, and taken to the recovery room in stable condition.  BLOOD LOSS:  300cc DRAINS: 1 hemovac, 1 pain catheter COMPLICATIONS:  None.  PLAN OF CARE: Admit to inpatient   PATIENT DISPOSITION:  PACU - hemodynamically stable.   Delay start of Pharmacological VTE agent  (>24hrs) due to surgical blood loss or risk of bleeding:  not applicable  Please fax a copy of this op note to my office at [phone] (please only include page 1 and 2 of the Case Information op note)

## 2016-12-20 NOTE — Transfer of Care (Signed)
Immediate Anesthesia Transfer of Care Note  Patient: [name]  Procedure(s) Performed: Procedure(s): LEFT TOTAL KNEE ARTHROPLASTY (Left)  Patient Location: PACU  Anesthesia Type:Regional and Spinal  Level of Consciousness: awake, alert  and oriented  Airway & Oxygen Therapy: Patient Spontanous Breathing and Patient connected to nasal cannula oxygen  Post-op Assessment: Report given to RN and Post -op Vital signs reviewed and stable  Post vital signs: Reviewed and stable  Last Vitals:  Vitals:   12/20/16 0822  BP: (!) 145/86  Pulse: (!) 58  Resp: 18  Temp: 36.4 C    Last Pain:  Vitals:   12/20/16 0822  TempSrc: Oral      Patients Stated Pain Goal: 4 (93/23/[id number])  Complications: No apparent anesthesia complications

## 2016-12-20 NOTE — Care Management Note (Signed)
Case Management Note  Patient Details  Name: [name] MRN: [id number] Date of Birth: Dec 15, 1935  Subjective/Objective:                    Action/Plan:  Patient arranged with [facility] for [name] , await PT eval Expected Discharge Date:                  Expected Discharge Plan:  [name]  In-House Referral:     Discharge planning Services  CM Consult  Post Acute Care Choice:  Home Health, Durable Medical Equipment Choice offered to:     DME Arranged:    DME Agency:     HH Arranged:  PT [name]:  [name] (now [facility])  Status of Service:  In process, will continue to follow  If discussed at Long Length of Stay Meetings, dates discussed:    Additional Comments:  Marilu Favre, RN 12/20/2016, 3:44 PM

## 2016-12-20 NOTE — Anesthesia Procedure Notes (Addendum)
Anesthesia Regional Block: Adductor canal block   Pre-Anesthetic Checklist: ,, timeout performed, Correct Patient, Correct Site, Correct Laterality, Correct Procedure, Correct Position, site marked, Risks and benefits discussed,  Surgical consent,  Pre-op evaluation,  At surgeon's request and post-op pain management  Laterality: Left  Prep: chloraprep       Needles:  Injection technique: Single-shot  Needle Type: Stimiplex     Needle Length: 9cm  Needle Gauge: 21     Additional Needles:   Procedures: ultrasound guided,,,,,,,,  Narrative:  Start time: 12/20/2016 9:59 AM End time: 12/20/2016 10:04 AM Injection made incrementally with aspirations every 5 mL.  Performed by: Personally  Anesthesiologist: Candida Peeling RAY  Additional Notes: 20 ml 0.5% Ropivacaine single shot Adductor Canal

## 2016-12-21 ENCOUNTER — Encounter (HOSPITAL_COMMUNITY): Payer: Self-pay | Admitting: Orthopedic Surgery

## 2016-12-21 DIAGNOSIS — I130 Hypertensive heart and chronic kidney disease with heart failure and stage 1 through stage 4 chronic kidney disease, or unspecified chronic kidney disease: Secondary | ICD-10-CM | POA: Diagnosis not present

## 2016-12-21 DIAGNOSIS — E1122 Type 2 diabetes mellitus with diabetic chronic kidney disease: Secondary | ICD-10-CM | POA: Diagnosis not present

## 2016-12-21 DIAGNOSIS — N183 Chronic kidney disease, stage 3 (moderate): Secondary | ICD-10-CM | POA: Diagnosis not present

## 2016-12-21 DIAGNOSIS — I5032 Chronic diastolic (congestive) heart failure: Secondary | ICD-10-CM | POA: Diagnosis not present

## 2016-12-21 DIAGNOSIS — M1712 Unilateral primary osteoarthritis, left knee: Secondary | ICD-10-CM | POA: Diagnosis not present

## 2016-12-21 DIAGNOSIS — M25762 Osteophyte, left knee: Secondary | ICD-10-CM | POA: Diagnosis not present

## 2016-12-21 LAB — BASIC METABOLIC PANEL
Anion gap: 8 (ref 5–15)
BUN: 21 mg/dL — AB (ref 6–20)
CALCIUM: 8.7 mg/dL — AB (ref 8.9–10.3)
CO2: 24 mmol/L (ref 22–32)
CREATININE: 1.55 mg/dL — AB (ref 0.61–1.24)
Chloride: 104 mmol/L (ref 101–111)
GFR, EST AFRICAN AMERICAN: 48 mL/min — AB (ref >60)
GFR, EST NON AFRICAN AMERICAN: 41 mL/min — AB (ref >60)
Glucose, Bld: 127 mg/dL — ABNORMAL HIGH (ref 65–99)
Potassium: 4.7 mmol/L (ref 3.5–5.1)
SODIUM: 136 mmol/L (ref 135–145)

## 2016-12-21 LAB — CBC
HCT: 30.1 % — ABNORMAL LOW (ref 39.0–52.0)
HEMOGLOBIN: 9.8 g/dL — AB (ref 13.0–17.0)
MCH: 30.3 pg (ref 26.0–34.0)
MCHC: 32.6 g/dL (ref 30.0–36.0)
MCV: 93.2 fL (ref 78.0–100.0)
Platelets: 126 10*3/uL — ABNORMAL LOW (ref 150–400)
RBC: 3.23 MIL/uL — ABNORMAL LOW (ref 4.22–5.81)
RDW: 14.5 % (ref 11.5–15.5)
WBC: 6.6 10*3/uL (ref 4.0–10.5)

## 2016-12-21 LAB — GLUCOSE, CAPILLARY: GLUCOSE-CAPILLARY: 132 mg/dL — AB (ref 65–99)

## 2016-12-21 MED ORDER — ASPIRIN 325 MG PO TBEC: 325.0000 mg | DELAYED_RELEASE_TABLET | Freq: Every day | ORAL | 0 refills | Status: DC

## 2016-12-21 MED ORDER — METHOCARBAMOL 500 MG PO TABS: 500.0000 mg | ORAL_TABLET | Freq: Four times a day (QID) | ORAL | 0 refills | Status: DC | PRN

## 2016-12-21 MED ORDER — OXYCODONE HCL 10 MG PO TABS: 10.0000 mg | ORAL_TABLET | ORAL | 0 refills | Status: DC | PRN

## 2016-12-21 NOTE — Anesthesia Postprocedure Evaluation (Signed)
Anesthesia Post Note  Patient: [name]  Procedure(s) Performed: Procedure(s) (LRB): LEFT TOTAL KNEE ARTHROPLASTY (Left)  Patient location during evaluation: PACU Anesthesia Type: Spinal Level of consciousness: oriented and awake and alert Pain management: pain level controlled Vital Signs Assessment: post-procedure vital signs reviewed and stable Respiratory status: spontaneous breathing and respiratory function stable Cardiovascular status: blood pressure returned to baseline and stable Postop Assessment: no headache and no backache Anesthetic complications: no       Last Vitals:  Vitals:   12/21/16 0146 12/21/16 0645  BP: 140/76 116/78  Pulse: 67 90  Resp:  18  Temp:  36.8 C    Last Pain:  Vitals:   12/21/16 0645  TempSrc: Oral  PainSc:                  [name]

## 2016-12-21 NOTE — Care Management (Signed)
Case manager spoke with patient concerning discharge plan. Patient was preoperatively setup with [facility], no changes. States he has RW and 3in1, CPM has been delivered to his home. Patient will have family support at discharge.[name]

## 2016-12-21 NOTE — Discharge Summary (Signed)
SPORTS MEDICINE & JOINT REPLACEMENT   Lara Mulch, MD   Carlyon Shadow, PA-C [place], [place], [place]  [address]                             [phone]  PATIENT ID: [name]        MRN:  [id number]          DOB/AGE: 1936/04/03 / 81 y.o.    DISCHARGE SUMMARY  ADMISSION DATE:    12/20/2016 DISCHARGE DATE:   12/21/2016   ADMISSION DIAGNOSIS: primary osteoarthritis left knee    DISCHARGE DIAGNOSIS:  primary osteoarthritis left knee    ADDITIONAL DIAGNOSIS: Active Problems:   S/P total knee replacement  Past Medical History:  Diagnosis Date  . AAA (abdominal aortic aneurysm) ([place]) 08/30/12   Doppler 08/30/12 had a small amount of growth. It was a 4.2 x 4.3 cm greater in size than the previous one noted at 4 x 3.8.  05-21-15 followed by CT abd-Dr. Kellie Simmering.  . Adenomatous colon polyp   . Ankle edema    Chronic  . Asthma    as a child  . CAD, multiple vessel 1985-2010   Most recent cath 01/01/09: 100% Occluded LAD after SP1, patent LIMA-distal LAD with apical 90% lesion.  Cx-OM1 widely patent stent into proximal bifurcating OM1 . Follow-on Cx => 70-80% -- non-amenable PCI. Widely patent 3 overlapping stents in midRCA with only 40% RPL stenosis; SVG- OM known to be occluded.  . Choledocholithiasis   . Cholelithiasis - with cholangitis    status post ERCP with removal of calculi and biliary stent placement.  . Chronic anemia    On iron supplement; history of positive guaiac - negative colonoscopy in 1996.; Thought to be related to hemorrhoids; status post hemorrhoidectomy  . Chronic back pain     multiple surgeries; C-spine and lumbar  . Diabetes mellitus    Not on oral medication  . Diverticulitis of colon 1996  . Diverticulosis   . Dyslipidemia, goal LDL below 70   . Erectile dysfunction   . Exertional dyspnea    Chronic baseline SOB with ambulation  . GERD (gastroesophageal reflux disease)   . H/O unstable angina 06/2003, 12/2006, 05/2014   a) '04: Staged  Taxus DES PCI to RCA and Cx-OM;;'08 - PCI to proximal ISR in RCA - Cypher DES; c) 05/2014: ISR in RCA & OM1, PCI with Promus P DES: RCA  2.75 x 12, OM1 3.0 mm x 8)  . H/O: pneumonia February 14  . Hemorrhoids   . Hiatal hernia   . History of nuclear stress test December 2013   LOW at Risk. Moderate region of mid to basal inferolateral scar without ischemia -- consistent with distal circumflex disease. Mild apical hypokinesis with an EF of 47%.  Marland Kitchen History of: ST elevation myocardial infarction (STEMI) involving left circumflex coronary artery with complication 2423   PTCA-circumflex; PCI in 1991  . Hypertension   . Irregular heart beat    PVCs and PACs, no arrhythmia recorded  . Moderate aortic stenosis by prior echocardiogram October 2016   Normal LV function - EF 55-60%.. Abnormal relaxation. Mild-moderate aortic stenosis (peak/mean gradient 18/10 mmH)  . Osteoarthritis of both knees    And back; multiple back surgeries, right knee arthroplasty and left knee arthroscopic surgery x2  . Pneumonia 2016  . S/P CABG x 2 1997   LIMA-LAD, SVG-OM  . Sleep apnea  pt. states he was told to return for f/u, to be fitted for Cpap, but pt. reports that he didn't follow up-no cpap used    PROCEDURE: Procedure(s): LEFT TOTAL KNEE ARTHROPLASTY on 12/20/2016  CONSULTS:    HISTORY:  See H&P in chart  HOSPITAL COURSE:  MACKIE HOLNESS is a 81 y.o. admitted on 12/20/2016 and found to have a diagnosis of primary osteoarthritis left knee.  After appropriate laboratory studies were obtained  they were taken to the operating room on 12/20/2016 and underwent Procedure(s): LEFT TOTAL KNEE ARTHROPLASTY.   They were given perioperative antibiotics:  Anti-infectives    Start     Dose/Rate Route Frequency Ordered Stop   12/20/16 1630  ceFAZolin (ANCEF) IVPB 1 g/50 mL premix     1 g 100 mL/hr over 30 Minutes Intravenous Every 6 hours 12/20/16 1534 12/20/16 2250   12/20/16 0930  ceFAZolin (ANCEF) IVPB 2g/100 mL  premix     2 g 200 mL/hr over 30 Minutes Intravenous To [facility]Stay [facility] 12/17/16 1327 12/20/16 1028    .  Patient given tranexamic acid IV or topical and exparel intra-operatively.  Tolerated the procedure well.    POD# 1: Vital signs were stable.  Patient denied Chest pain, shortness of breath, or calf pain.  Patient was started on Lovenox 30 mg subcutaneously twice daily at 8am.  Consults to PT, OT, and care management were made.  The patient was weight bearing as tolerated.  CPM was placed on the operative leg 0-90 degrees for 6-8 hours a day. When out of the CPM, patient was placed in the foam block to achieve full extension. Incentive spirometry was taught.  Dressing was changed.       POD #2, Continued  PT for ambulation and exercise program.  IV saline locked.  O2 discontinued.    The remainder of the hospital course was dedicated to ambulation and strengthening.   The patient was discharged on 1 Day Post-Op in  Good condition.  Blood products given:none  DIAGNOSTIC STUDIES: Recent vital signs: Patient Vitals for the past 24 hrs:  BP Temp Temp src Pulse Resp SpO2  12/21/16 0645 116/78 98.3 F (36.8 C) Oral 90 18 99 %  12/21/16 0146 140/76 - - 67 - 96 %  12/20/16 2045 134/72 97.6 F (36.4 C) Oral 64 16 97 %  12/20/16 1537 133/68 97.9 F (36.6 C) - 64 17 99 %  12/20/16 1515 - 97.9 F (36.6 C) - 60 (!) 26 99 %  12/20/16 1505 134/72 - - (!) 58 20 99 %  12/20/16 1450 134/77 - - (!) 51 15 96 %  12/20/16 1435 132/69 - - (!) 56 16 94 %  12/20/16 1420 126/64 - - (!) 52 15 97 %  12/20/16 1405 124/65 - - (!) 54 20 93 %  12/20/16 1350 123/68 - - (!) 55 16 93 %  12/20/16 1335 123/63 - - (!) 55 17 97 %  12/20/16 1320 122/62 - - (!) 54 14 95 %  12/20/16 1305 124/64 - - (!) 57 16 97 %  12/20/16 1250 126/60 - - (!) 58 15 94 %  12/20/16 1235 101/61 97.5 F (36.4 C) - 61 19 95 %       Recent laboratory studies:  Recent Labs  12/21/16 0500  WBC 6.6  HGB 9.8*  HCT 30.1*   PLT 126*    Recent Labs  12/21/16 0500  NA 136  K 4.7  CL 104  CO2 24  BUN 21*  CREATININE 1.55*  GLUCOSE 127*  CALCIUM 8.7*   Lab Results  Component Value Date   INR 1.19 08/13/2015   INR 1.11 08/05/2015   INR 1.23 06/28/2014     Recent Radiographic Studies :  Dg Chest 2 View  Result Date: 12/01/2016 CLINICAL DATA:  Three weeks of productive cough, shortness of breath, and mild chest tightness. History of coronary artery disease with previous CABG, hypertension, diabetes EXAM: CHEST  2 VIEW COMPARISON:  PA and lateral chest x-ray of September 05, 2015 FINDINGS: The lungs are adequately inflated and clear. The heart and pulmonary vascularity are normal. There are post CABG changes. There is calcification in the wall of the aortic arch. There is air within a small hiatal hernia. The sternal wires are intact. There is no pleural effusion. The bony thorax exhibits no acute abnormality. IMPRESSION: There is no pneumonia, CHF, nor other acute cardiopulmonary abnormality. Thoracic aortic atherosclerosis. Electronically Signed   By: David  Martinique M.D.   On: 12/01/2016 16:21   Ct Angio Abdomen Pelvis  W &/or Wo Contrast  Result Date: 12/17/2016 CLINICAL DATA:  Aortic stent graft follow-up EXAM: CTA ABDOMEN AND PELVIS wITHOUT AND WITH CONTRAST TECHNIQUE: Multidetector CT imaging of the abdomen and pelvis was performed using the standard protocol during bolus administration of intravenous contrast. Multiplanar reconstructed images and MIPs were obtained and reviewed to evaluate the vascular anatomy. CONTRAST:  75 cc Isovue 370 COMPARISON:  10/07/2016 FINDINGS: VASCULAR Aorta: Aorto bi-iliac stent graft is stable in position. Maximal sac diameter is stable at 5.6 x 4.5 cm. The type 2 endoleak is again noted emanating from a right L4 lumbar artery. Embolization coils have been placed in the IMA. There is some enhancement in the IMA stump above the coils likely emanating from the endoleak. IMA  branches are patent. Celiac: Patent. SMA: Patent and ectatic. Renals: There are atherosclerotic changes at the origin of the renal arteries. No significant narrowing. A tiny accessory right lower pole renal artery has been sacrificed. IMA: Coil embolization of the proximal IMA is noted. Branches reconstitute. The IMA stump opacifies likely from the endoleak. Inflow: Mid right common iliac artery landing zone is patent. Common and external iliac arteries are patent. Internal iliac artery is patent. Mid left common iliac artery landing zone is patent. Left common, internal, and external iliac arteries are patent. Veins: Renal veins are patent. IVC is patent. Common iliac veins are patent. Review of the MIP images confirms the above findings. NON-VASCULAR Lower chest: Dependent atelectasis. Hepatobiliary: There is focal transient enhancement in the inferior right lobe of the liver are. See image 58 of series 5. This resolves and most likely represents a vascular phenomenon. No definite liver mass. Postcholecystectomy. Pancreas: Unremarkable. Spleen: Unremarkable. Adrenals/Urinary Tract: Fat containing lesion in the left adrenal gland is stable. Right adrenal gland is normal. Left kidney is unremarkable. Atrophy of the right kidney is present. Stomach/Bowel: Moderate size hiatal hernia. No obvious mass in the colon. Normal appendix. Diverticulosis of the colon. No evidence of small-bowel obstruction. Lymphatic: No evidence of retroperitoneal adenopathy. Reproductive: Prostate remains mildly prominent.  Normal bladder. Other: No free-fluid. Musculoskeletal: No vertebral compression deformity. Lumbar fusion hardware is in place. There is significant spinal stenosis just above the fusion level. IMPRESSION: VASCULAR After embolization of the IMA, the type 2 endoleak persists and sac diameter is stable with a maximal diameter of 5.6 x 4.5 cm. The culprit is a persistent lumbar artery. NON-VASCULAR Chronic changes.  Electronically Signed  By: Marybelle Killings M.D.   On: 12/17/2016 12:07    DISCHARGE INSTRUCTIONS: Discharge Instructions    CPM    Complete by:  As directed    Continuous passive motion machine (CPM):      Use the CPM from 0 to 90 for 4-6 hours per day.      You may increase by 10 per day.  You may break it up into 2 or 3 sessions per day.      Use CPM for 2 weeks or until you are told to stop.   Call MD / Call 911    Complete by:  As directed    If you experience chest pain or shortness of breath, CALL 911 and be transported to the hospital emergency room.  If you develope a fever above 101 F, pus (white drainage) or increased drainage or redness at the wound, or calf pain, call your surgeon's office.   Constipation Prevention    Complete by:  As directed    Drink plenty of fluids.  Prune juice may be helpful.  You may use a stool softener, such as Colace (over the counter) 100 mg twice a day.  Use MiraLax (over the counter) for constipation as needed.   Diet - low sodium heart healthy    Complete by:  As directed    Discharge instructions    Complete by:  As directed    INSTRUCTIONS AFTER JOINT REPLACEMENT   Remove items at home which could result in a fall. This includes throw rugs or furniture in walking pathways ICE to the affected joint every three hours while awake for 30 minutes at a time, for at least the first 3-5 days, and then as needed for pain and swelling.  Continue to use ice for pain and swelling. You may notice swelling that will progress down to the foot and ankle.  This is normal after surgery.  Elevate your leg when you are not up walking on it.   Continue to use the breathing machine you got in the hospital (incentive spirometer) which will help keep your temperature down.  It is common for your temperature to cycle up and down following surgery, especially at night when you are not up moving around and exerting yourself.  The breathing machine keeps your lungs expanded  and your temperature down.   DIET:  As you were doing prior to hospitalization, we recommend a well-balanced diet.  DRESSING / WOUND CARE / SHOWERING  Keep the surgical dressing until follow up.  The dressing is water proof, so you can shower without any extra covering.  IF THE DRESSING FALLS OFF or the wound gets wet inside, change the dressing with sterile gauze.  Please use good hand washing techniques before changing the dressing.  Do not use any lotions or creams on the incision until instructed by your surgeon.    ACTIVITY  Increase activity slowly as tolerated, but follow the weight bearing instructions below.   No driving for 6 weeks or until further direction given by your physician.  You cannot drive while taking narcotics.  No lifting or carrying greater than 10 lbs. until further directed by your surgeon. Avoid periods of inactivity such as sitting longer than an hour when not asleep. This helps prevent blood clots.  You may return to work once you are authorized by your doctor.     WEIGHT BEARING   Weight bearing as tolerated with assist device (walker, cane, etc) as directed, use  it as long as suggested by your surgeon or therapist, typically at least 4-6 weeks.   EXERCISES  Results after joint replacement surgery are often greatly improved when you follow the exercise, range of motion and muscle strengthening exercises prescribed by your doctor. Safety measures are also important to protect the joint from further injury. Any time any of these exercises cause you to have increased pain or swelling, decrease what you are doing until you are comfortable again and then slowly increase them. If you have problems or questions, call your caregiver or physical therapist for advice.   Rehabilitation is important following a joint replacement. After just a few days of immobilization, the muscles of the leg can become weakened and shrink (atrophy).  These exercises are designed to  build up the tone and strength of the thigh and leg muscles and to improve motion. Often times heat used for twenty to thirty minutes before working out will loosen up your tissues and help with improving the range of motion but do not use heat for the first two weeks following surgery (sometimes heat can increase post-operative swelling).   These exercises can be done on a training (exercise) mat, on the floor, on a table or on a bed. Use whatever works the best and is most comfortable for you.    Use music or television while you are exercising so that the exercises are a pleasant break in your day. This will make your life better with the exercises acting as a break in your routine that you can look forward to.   Perform all exercises about fifteen times, three times per day or as directed.  You should exercise both the operative leg and the other leg as well.   Exercises include:   Quad Sets - Tighten up the muscle on the front of the thigh (Quad) and hold for 5-10 seconds.   Straight Leg Raises - With your knee straight (if you were given a brace, keep it on), lift the leg to 60 degrees, hold for 3 seconds, and slowly lower the leg.  Perform this exercise against resistance later as your leg gets stronger.  Leg Slides: Lying on your back, slowly slide your foot toward your buttocks, bending your knee up off the floor (only go as far as is comfortable). Then slowly slide your foot back down until your leg is flat on the floor again.  Angel Wings: Lying on your back spread your legs to the side as far apart as you can without causing discomfort.  Hamstring Strength:  Lying on your back, push your heel against the floor with your leg straight by tightening up the muscles of your buttocks.  Repeat, but this time bend your knee to a comfortable angle, and push your heel against the floor.  You may put a pillow under the heel to make it more comfortable if necessary.   A rehabilitation program following  joint replacement surgery can speed recovery and prevent re-injury in the future due to weakened muscles. Contact your doctor or a physical therapist for more information on knee rehabilitation.    CONSTIPATION  Constipation is defined medically as fewer than three stools per week and severe constipation as less than one stool per week.  Even if you have a regular bowel pattern at home, your normal regimen is likely to be disrupted due to multiple reasons following surgery.  Combination of anesthesia, postoperative narcotics, change in appetite and fluid intake all can affect your bowels.  YOU MUST use at least one of the following options; they are listed in order of increasing strength to get the job done.  They are all available over the counter, and you may need to use some, POSSIBLY even all of these options:    Drink plenty of fluids (prune juice may be helpful) and high fiber foods Colace 100 mg by mouth twice a day  Senokot for constipation as directed and as needed Dulcolax (bisacodyl), take with full glass of water  Miralax (polyethylene glycol) once or twice a day as needed.  If you have tried all these things and are unable to have a bowel movement in the first 3-4 days after surgery call either your surgeon or your primary doctor.    If you experience loose stools or diarrhea, hold the medications until you stool forms back up.  If your symptoms do not get better within 1 week or if they get worse, check with your doctor.  If you experience "the worst abdominal pain ever" or develop nausea or vomiting, please contact the office immediately for further recommendations for treatment.   ITCHING:  If you experience itching with your medications, try taking only a single pain pill, or even half a pain pill at a time.  You can also use Benadryl over the counter for itching or also to help with sleep.   TED HOSE STOCKINGS:  Use stockings on both legs until for at least 2 weeks or as  directed by physician office. They may be removed at night for sleeping.  MEDICATIONS:  See your medication summary on the "After Visit Summary" that nursing will review with you.  You may have some home medications which will be placed on hold until you complete the course of blood thinner medication.  It is important for you to complete the blood thinner medication as prescribed.  PRECAUTIONS:  If you experience chest pain or shortness of breath - call 911 immediately for transfer to the hospital emergency department.   If you develop a fever greater that 101 F, purulent drainage from wound, increased redness or drainage from wound, foul odor from the wound/dressing, or calf pain - CONTACT YOUR SURGEON.                                                   FOLLOW-UP APPOINTMENTS:  If you do not already have a post-op appointment, please call the office for an appointment to be seen by your surgeon.  Guidelines for how soon to be seen are listed in your "After Visit Summary", but are typically between 1-4 weeks after surgery.  OTHER INSTRUCTIONS:   Knee Replacement:  Do not place pillow under knee, focus on keeping the knee straight while resting. CPM instructions: 0-90 degrees, 2 hours in the morning, 2 hours in the afternoon, and 2 hours in the evening. Place foam block, curve side up under heel at all times except when in CPM or when walking.  DO NOT modify, tear, cut, or change the foam block in any way.  MAKE SURE YOU:  Understand these instructions.  Get help right away if you are not doing well or get worse.    Thank you for letting us be a part of your medical care team.  It is a privilege we respect greatly.  We hope these instructions will  help you stay on track for a fast and full recovery!   Increase activity slowly as tolerated    Complete by:  As directed       DISCHARGE MEDICATIONS:   Allergies as of 12/21/2016      Reactions   Lisinopril Cough   Statins Other (See Comments)    MYALGIAS Muscle ache   Welchol [colesevelam Hcl] Itching      Medication List    STOP taking these medications   ibuprofen 200 MG tablet Commonly known as:  ADVIL,MOTRIN   promethazine-dextromethorphan 6.25-15 MG/5ML syrup Commonly known as:  PROMETHAZINE-DM     TAKE these medications   acetaminophen 500 MG tablet Commonly known as:  TYLENOL Take 1,000 mg by mouth every 6 (six) hours as needed for mild pain, moderate pain or headache. Reported on 03/16/2016   albuterol 108 (90 Base) MCG/ACT inhaler Commonly known as:  PROVENTIL HFA;VENTOLIN HFA Inhale 2 puffs into the lungs every 6 (six) hours as needed for wheezing or shortness of breath.   amLODipine 5 MG tablet Commonly known as:  NORVASC TAKE 5 MG BY MOUTH DAILY....need appointment before further refills What changed:  how much to take  how to take this  when to take this  additional instructions   aspirin 325 MG EC tablet Take 1 tablet (325 mg total) by mouth daily with breakfast.   clopidogrel 75 MG tablet Commonly known as:  PLAVIX TAKE 75 MG BY MOUTH ONCE DAILY   Fenofibric Acid 135 MG Cpdr TAKE ONE CAPSULE BY MOUTH ONCE DAILY What changed:  See the new instructions.   ferrous sulfate 325 (65 FE) MG tablet Take 325 mg by mouth daily with breakfast.   glucose blood test strip Pt uses one touch verio test strips and tests twice daily. What changed:  how much to take  how to take this  when to take this  additional instructions   losartan 100 MG tablet Commonly known as:  COZAAR Take 1 tablet (100 mg total) by mouth daily.   methocarbamol 500 MG tablet Commonly known as:  ROBAXIN Take 1-2 tablets (500-1,000 mg total) by mouth every 6 (six) hours as needed for muscle spasms.   multivitamin with minerals tablet Take 1 tablet by mouth daily.   Oxycodone HCl 10 MG Tabs Take 1 tablet (10 mg total) by mouth every 3 (three) hours as needed for breakthrough pain.   pantoprazole 40 MG  tablet Commonly known as:  PROTONIX TAKE ONE TABLET BY MOUTH ONCE DAILY   pioglitazone-metformin 15-500 MG tablet Commonly known as:  ACTOPLUS MET TAKE ONE TABLET BY MOUTH ONCE DAILY   sildenafil 20 MG tablet Commonly known as:  REVATIO TAKE 2-5 TABLETS BY MOUTH DAILY AS NEEDED What changed:  See the new instructions.            Durable Medical Equipment        Start     Ordered   12/20/16 1535  DME Walker rolling  Once    Question:  Patient needs a walker to treat with the following condition  Answer:  S/P total knee replacement   12/20/16 1534   12/20/16 1535  DME 3 n 1  Once     12/20/16 1534   12/20/16 1535  DME Bedside commode  Once    Question:  Patient needs a bedside commode to treat with the following condition  Answer:  S/P total knee replacement   12/20/16 1534      FOLLOW UP  VISIT:   Follow-up Information    [facility] Follow up.   Specialty:  [place] Why:  A representative from [facility] will contact you to arrange start date and time for therapy. Contact information: [address] [place] [address] [phone]           DISPOSITION: HOME VS. SNF  CONDITION:  Good   Donia Ast 12/21/2016, 12:21 PM

## 2016-12-21 NOTE — Evaluation (Addendum)
Physical Therapy Evaluation Patient Details Name: [name] MRN: [id number] DOB: 04-03-1936 Today's Date: 12/21/2016   History of Present Illness  Pt is a 81 yo male with end stage OA of L knee s/p L TKA 12/19/16 PMH AAA, CAD, DM, Exertional dyspnea, and HTN.   Clinical Impression  Pt is s/p TKA resulting in the deficits listed below (see PT Problem List). Pt minAx1 for transfers and ambulation of 50 feet, pt experienced unsteadiness coming to standing and 2xLOB requiring minAx1 for correction. Pt will benefit from skilled PT to increase their independence and safety with mobility to allow discharge to the venue listed below. Pt to practice steps at next PT session.     Follow Up Recommendations Home health PT;Supervision/Assistance - 24 hour    Equipment Recommendations  Rolling walker with 5" wheels    Recommendations for Other Services       Precautions / Restrictions Precautions Precautions: Knee Restrictions Weight Bearing Restrictions: Yes LLE Weight Bearing: Weight bearing as tolerated      Mobility  Bed Mobility               General bed mobility comments: in recliner on entry  Transfers Overall transfer level: Needs assistance Equipment used: Rolling walker (2 wheeled) Transfers: Sit to/from Stand Sit to Stand: Min assist         General transfer comment: vc for hand placement for push off, pt with unsteadiness once in standing with weightshifting requiring minA for stability   Ambulation/Gait Ambulation/Gait assistance: Min assist Ambulation Distance (Feet): 50 Feet Assistive device: Rolling walker (2 wheeled) Gait Pattern/deviations: Step-through pattern;Decreased step length - right;Decreased stance time - left;Decreased weight shift to left;Antalgic;Trunk flexed Gait velocity: slow Gait velocity interpretation: Below normal speed for age/gender General Gait Details: Pt unsure of weightshift onto L LE with 2x LoB requiring minAx1 to recover, vc  for use of UE for steadiness in walker and education on WBAT       Balance Overall balance assessment: Needs assistance         Standing balance support: During functional activity;Bilateral upper extremity supported Standing balance-Leahy Scale: Fair Standing balance comment: Pt requires UE support for weightshift onto L LE to maintain balance.                             Pertinent Vitals/Pain Pain Assessment: 0-10 Pain Score: 9  Pain Location: L knee Pain Descriptors / Indicators: Constant;Discomfort;Grimacing Pain Intervention(s): RN gave pain meds during session  [place] expects to be discharged to:: Private residence Living Arrangements: Spouse/significant other Available Help at Discharge: Family;Available 24 hours/day Type of Home: House Home Access: Stairs to enter Entrance Stairs-Rails: None Entrance Stairs-Number of Steps: 2 Home Layout: Two level;Other (Comment);Able to live on main level with bedroom/bathroom (Basement) Home Equipment: Gilford Rile - 2 wheels;Bedside commode;Adaptive equipment;Grab bars - tub/shower Additional Comments: Pt has DME from last TKA    Prior Function Level of Independence: Independent         Comments: Pt reports that he was indpenedent in ADLs PTA     Hand Dominance   Dominant Hand: Right    Extremity/Trunk Assessment        Lower Extremity Assessment Lower Extremity Assessment: LLE deficits/detail    Cervical / Trunk Assessment Cervical / Trunk Assessment: Normal  Communication   Communication: No difficulties  Cognition Arousal/Alertness: Awake/alert Behavior During Therapy: WFL for tasks assessed/performed Overall Cognitive  Status: Within Functional Limits for tasks assessed                         Exercises Total Joint Exercises Ankle Circles/Pumps: AROM;Both;10 reps;Seated Quad Sets: AROM;Both;10 reps;Seated Gluteal Sets: AROM;Both;10 reps;Seated Heel Slides:  AROM;Both;10 reps;Seated Straight Leg Raises: AROM;Both;10 reps;Seated Long Arc Quad: AROM;Both;10 reps;Seated Goniometric ROM: -3 degree ext in recliner to 85 flexion measured in seated   Assessment/Plan    PT Assessment Patient needs continued PT services  PT Problem List Decreased range of motion;Decreased strength;Decreased activity tolerance;Decreased balance;Decreased mobility;Decreased knowledge of use of DME;Pain       PT Treatment Interventions DME instruction;Gait training;Stair training;Functional mobility training;Therapeutic activities;Therapeutic exercise;Balance training;Patient/family education    PT Goals (Current goals can be found in the Care Plan section)  Acute Rehab PT Goals Patient Stated Goal: Go home PT Goal Formulation: With patient Time For Goal Achievement: 12/28/16 Potential to Achieve Goals: Good    Frequency 7X/week    End of Session Equipment Utilized During Treatment: Gait belt Activity Tolerance: Patient tolerated treatment well Patient left: in chair;with call bell/phone within reach Nurse Communication: Mobility status PT Visit Diagnosis: Unsteadiness on feet (R26.81);Pain;Other abnormalities of gait and mobility (R26.89) Pain - Right/Left: Left Pain - part of body: Knee    Functional Assessment Tool Used: AM-PAC 6 Clicks Basic Mobility Functional Limitation: Mobility: Walking and moving around Mobility: Walking and Moving Around Current Status (S2831): At least 40 percent but less than 60 percent impaired, limited or restricted Mobility: Walking and Moving Around Goal Status [phone]): At least 1 percent but less than 20 percent impaired, limited or restricted    Time: 0923-0948 PT Time Calculation (min) (ACUTE ONLY): 25 min   Charges:   PT Evaluation $PT Eval Low Complexity: 1 Procedure PT Treatments $Gait Training: 8-22 mins   PT G Codes:   PT G-Codes **NOT FOR INPATIENT CLASS** Functional Assessment Tool Used: AM-PAC 6 Clicks  Basic Mobility Functional Limitation: Mobility: Walking and moving around Mobility: Walking and Moving Around Current Status (Y0737): At least 40 percent but less than 60 percent impaired, limited or restricted Mobility: Walking and Moving Around Goal Status [phone]): At least 1 percent but less than 20 percent impaired, limited or restricted     [place] 12/21/2016, 12:27 PM  Dani Gobble. Migdalia Dk PT, DPT Acute Rehabilitation  [phone] Pager [phone]

## 2016-12-21 NOTE — Evaluation (Signed)
Occupational Therapy Evaluation Patient Details Name: [name] MRN: [id number] DOB: 02-05-1936 Today's Date: 12/21/2016    History of Present Illness   81 y.o. male presented with a history of pain in the left knee. Underwent L TKA 12/20/2016.   Clinical Impression   PTA,  Pt lived with his wife and was independent in ADLs. Currently, pt performs ADLs with Min guard-Min A for balance in standing. Will follow acutely to facilitate progress towards PLOF and safe dc home. Recommend dc home once medically stable per physician with initial 24 hour supervision.    Follow Up Recommendations  Supervision/Assistance - 24 hour    Equipment Recommendations  None recommended by OT    Recommendations for Other Services       Precautions / Restrictions        Mobility Bed Mobility Overal bed mobility: Modified Independent             General bed mobility comments: Increased time  Transfers Overall transfer level: Needs assistance Equipment used: Rolling walker (2 wheeled) Transfers: Sit to/from Stand Sit to Stand: Min assist         General transfer comment: Pt demonstrated some unsteadiness during standing balance and required Min A for balance during sit<>stand    Balance Overall balance assessment: Needs assistance Sitting-balance support: Feet supported Sitting balance-Leahy Scale: Good Sitting balance - Comments: Unable to weight shift and maintain sitting balance when donning socks   Standing balance support: During functional activity;Bilateral upper extremity supported Standing balance-Leahy Scale: Fair Standing balance comment: Pt benefits from having hand on surface (ie.e RW or sink) during standing balance. Demonstrates some unsteadiness during static standing at sink                            ADL Overall ADL's : Needs assistance/impaired     Grooming: Oral care;Wash/dry face;Min guard;Minimal assistance;Standing Grooming Details  (indicate cue type and reason): Pt required Min A for inital balance at sink but progress to needing Min guard A with increased balance Upper Body Bathing: Supervision/ safety;Sitting   Lower Body Bathing: Min guard;Sit to/from stand   Upper Body Dressing : Set up;Supervision/safety;Sitting   Lower Body Dressing: Set up;Supervision/safety;Sit to/from stand Lower Body Dressing Details (indicate cue type and reason): Pt donned socks with set up and supervision at EOB Toilet Transfer: Minimal assistance;RW;Ambulation Toilet Transfer Details (indicate cue type and reason): Simulated to recliner         Functional mobility during ADLs: Minimal assistance;Rolling walker General ADL Comments: This is pt's first time OOB     Vision Baseline Vision/History: Wears glasses Wears Glasses: Reading only       Perception     Praxis      Pertinent Vitals/Pain Pain Assessment: 0-10 Pain Score: 8  Pain Location: L knee Pain Descriptors / Indicators: Constant;Discomfort;Grimacing Pain Intervention(s): Monitored during session     Hand Dominance Right   Extremity/Trunk Assessment Upper Extremity Assessment Upper Extremity Assessment: Overall WFL for tasks assessed   Lower Extremity Assessment Lower Extremity Assessment: Defer to PT evaluation   Cervical / Trunk Assessment Cervical / Trunk Assessment: Normal   Communication Communication Communication: No difficulties   Cognition Arousal/Alertness: Awake/alert Behavior During Therapy: WFL for tasks assessed/performed Overall Cognitive Status: Within Functional Limits for tasks assessed                     General Comments   VSS throughout session  Exercises       Shoulder Instructions      Home Living Family/patient expects to be discharged to:: Private residence Living Arrangements: Spouse/significant other Available Help at Discharge: Family;Available 24 hours/day Type of Home: House Home Access: Stairs to  enter [employer] of Steps: 2 Entrance Stairs-Rails: None Home Layout: Two level;Other (Comment);Able to live on main level with bedroom/bathroom (Basement)     Bathroom Shower/Tub: Occupational psychologist: Standard     Home Equipment: Environmental consultant - 2 wheels;Bedside commode;Adaptive equipment;Grab bars - tub/shower Adaptive Equipment: Reacher;Sock aid Additional Comments: Pt has DME from last TKA      Prior Functioning/Environment Level of Independence: Independent        Comments: Pt reports that he was indpenedent in ADLs PTA        OT Problem List: Decreased activity tolerance;Impaired balance (sitting and/or standing);Decreased knowledge of use of DME or AE;Decreased safety awareness;Pain      OT Treatment/Interventions: Therapeutic exercise;Self-care/ADL training;Energy conservation;DME and/or AE instruction;Therapeutic activities;Patient/family education    OT Goals(Current goals can be found in the care plan section) Acute Rehab OT Goals Patient Stated Goal: Go home OT Goal Formulation: With patient Time For Goal Achievement: 12/21/16 Potential to Achieve Goals: Good ADL Goals Pt Will Perform Grooming: with modified independence;standing Pt Will Perform Lower Body Dressing: sit to/from stand;with supervision Pt Will Transfer to Toilet: with supervision;regular height toilet;ambulating Additional ADL Goal #1: Pt will independently verbalize three fall prevention strategies  OT Frequency: Min 1X/week   Barriers to D/C:            Co-evaluation              End of Session Equipment Utilized During Treatment: Rolling walker CPM Left Knee CPM Left Knee: Off Left Knee Flexion (Degrees): 90 Left Knee Extension (Degrees): 0 Nurse Communication: Mobility status  Activity Tolerance: Patient tolerated treatment well Patient left: in chair;with call bell/phone within reach  OT Visit Diagnosis: Unsteadiness on feet (R26.81);Pain Pain -  Right/Left: Left Pain - part of body: Knee                ADL either performed or assessed with clinical judgement  Time: 0833-0903 OT Time Calculation (min): 30 min Charges:  OT General Charges $OT Visit: 1 Procedure OT Evaluation $OT Eval Low Complexity: 1 Procedure OT Treatments $Self Care/Home Management : 8-22 mins G-Codes: OT G-codes **NOT FOR INPATIENT CLASS** Functional Assessment Tool Used: Clinical judgement Functional Limitation: Self care Self Care Current Status (Y4825): At least 1 percent but less than 20 percent impaired, limited or restricted Self Care Goal Status (O0370): 0 percent impaired, limited or restricted   [facility], OTR/L [place] 12/21/2016, 9:22 AM

## 2016-12-21 NOTE — Progress Notes (Signed)
SPORTS MEDICINE AND JOINT REPLACEMENT  Lara Mulch, MD    Carlyon Shadow, PA-C [address], [place], [place]  [address]                             [phone]   PROGRESS NOTE  Subjective:  negative for Chest Pain  negative for Shortness of Breath  negative for Nausea/Vomiting   negative for Calf Pain  negative for Bowel Movement   Tolerating Diet: yes         Patient reports pain as 3 on 0-10 scale.    Objective: Vital signs in last 24 hours:   Patient Vitals for the past 24 hrs:  BP Temp Temp src Pulse Resp SpO2 Height Weight  12/21/16 0146 140/76 - - 67 - 96 % - -  12/20/16 2045 134/72 97.6 F (36.4 C) Oral 64 16 97 % - -  12/20/16 1537 133/68 97.9 F (36.6 C) - 64 17 99 % - -  12/20/16 1515 - 97.9 F (36.6 C) - 60 (!) 26 99 % - -  12/20/16 1505 134/72 - - (!) 58 20 99 % - -  12/20/16 1450 134/77 - - (!) 51 15 96 % - -  12/20/16 1435 132/69 - - (!) 56 16 94 % - -  12/20/16 1420 126/64 - - (!) 52 15 97 % - -  12/20/16 1405 124/65 - - (!) 54 20 93 % - -  12/20/16 1350 123/68 - - (!) 55 16 93 % - -  12/20/16 1335 123/63 - - (!) 55 17 97 % - -  12/20/16 1320 122/62 - - (!) 54 14 95 % - -  12/20/16 1305 124/64 - - (!) 57 16 97 % - -  12/20/16 1250 126/60 - - (!) 58 15 94 % - -  12/20/16 1235 101/61 97.5 F (36.4 C) - 61 19 95 % - -  12/20/16 0932 (!) 145/86 97.5 F (36.4 C) Oral (!) 58 18 99 % 5\' 9"  (1.753 m) 98.9 kg (218 lb)    @flow {1959:LAST@   Intake/Output from previous day:   03/19 0701 - 03/20 0700 In: 1710 [P.O.:360; I.V.:1300] Out: 1020 [Urine:820]   Intake/Output this shift:   No intake/output data recorded.   Intake/Output      03/19 0701 - 03/20 0700 03/20 0701 - 03/21 0700   P.O. 360    I.V. (mL/kg) 1300 (13.1)    IV Piggyback 50    Total Intake(mL/kg) 1710 (17.3)    Urine (mL/kg/hr) 820    Blood 200    Total Output 1020     Net +690             LABORATORY DATA:  Recent Labs  12/21/16 0500  WBC 6.6  HGB 9.8*  HCT 30.1*   PLT 126*    Recent Labs  12/21/16 0500  NA 136  K 4.7  CL 104  CO2 24  BUN 21*  CREATININE 1.55*  GLUCOSE 127*  CALCIUM 8.7*   Lab Results  Component Value Date   INR 1.19 08/13/2015   INR 1.11 08/05/2015   INR 1.23 06/28/2014    Examination:  General appearance: alert, cooperative and no distress Extremities: extremities normal, atraumatic, no cyanosis or edema  Wound Exam: clean, dry, intact   Drainage:  None: wound tissue dry  Motor Exam: Quadriceps and Hamstrings Intact  Sensory Exam: Superficial Peroneal, Deep Peroneal and Tibial normal  Assessment:    1 Day Post-Op  Procedure(s) (LRB): LEFT TOTAL KNEE ARTHROPLASTY (Left)  ADDITIONAL DIAGNOSIS:  Active Problems:   S/P total knee replacement     Plan: Physical Therapy as ordered Weight Bearing as Tolerated (WBAT)  DVT Prophylaxis:  Aspirin  DISCHARGE PLAN: Home  DISCHARGE NEEDS: HHPT   Patient doing well, anticipate D/C home today         Donia Ast 12/21/2016, 7:14 AM

## 2016-12-21 NOTE — Care Management Obs Status (Signed)
[place] NOTIFICATION   Patient Details  Name: DARYL BEEHLER MRN: [id number] Date of Birth: 02/06/1936   Medicare Observation Status Notification Given:  Yes    Ninfa Meeker, RN 12/21/2016, 10:58 AM

## 2016-12-21 NOTE — Progress Notes (Signed)
Physical Therapy Treatment Patient Details Name: [name] MRN: [id number] DOB: December 10, 1935 Today's Date: 12/21/2016    History of Present Illness Pt is a 81 yo male with end stage OA of L knee s/p L TKA 12/19/16 PMH AAA, CAD, DM, Exertional dyspnea, and HTN.     PT Comments    Patient is making good progress with PT.  From a mobility standpoint anticipate patient will be ready for DC home. Pt is supervision with ambulation with RW. Pt received stair training and can ascend/descend 5 steps with RW and supervision. Pt requires continued skilled PT after discharge to improve L knee ROM and strength and gait training to be safe in his home environment.      Follow Up Recommendations  Home health PT;Supervision/Assistance - 24 hour     Equipment Recommendations  Rolling walker with 5" wheels    Recommendations for Other Services       Precautions / Restrictions Precautions Precautions: Knee Restrictions Weight Bearing Restrictions: Yes LLE Weight Bearing: Weight bearing as tolerated    Mobility  Bed Mobility               General bed mobility comments: in recliner on entry  Transfers Overall transfer level: Needs assistance Equipment used: Rolling walker (2 wheeled) Transfers: Sit to/from Stand Sit to Stand: Modified independent (Device/Increase time)         General transfer comment: pt steady after ascent  Ambulation/Gait Ambulation/Gait assistance: Supervision   Assistive device: Rolling walker (2 wheeled) Gait Pattern/deviations: Step-through pattern;Decreased step length - right;Decreased stance time - left;Decreased weight shift to left;Antalgic;Trunk flexed Gait velocity: slow Gait velocity interpretation: Below normal speed for age/gender General Gait Details: slow steady gait, no buckling, no LoB, vc for anterior pelvic tilt and looking up and out   Stairs Stairs: Yes   Stair Management: With walker;No rails;Step to  pattern;Backwards;Forwards Number of Stairs: 10 General stair comments: vc for proper foot progression pt stable throughout but requires seated rest break after stairs before walking back to room  Wheelchair Mobility    Modified Rankin (Stroke Patients Only)       Balance Overall balance assessment: Needs assistance Sitting-balance support: Feet supported Sitting balance-Leahy Scale: Good     Standing balance support: During functional activity;Bilateral upper extremity supported Standing balance-Leahy Scale: Fair Standing balance comment: Pt requires UE support for weightshift onto L LE to maintain balance.                    Cognition Arousal/Alertness: Awake/alert Behavior During Therapy: WFL for tasks assessed/performed Overall Cognitive Status: Within Functional Limits for tasks assessed                      Exercises Total Joint Exercises Ankle Circles/Pumps: AROM;Both;10 reps;Seated Quad Sets: AROM;Both;10 reps;Seated Long Arc Quad: AROM;Both;10 reps;Seated Knee Flexion: AROM;Both;10 reps;Seated    General Comments        Pertinent Vitals/Pain Pain Location: L knee Pain Descriptors / Indicators: Constant;Discomfort;Grimacing  VSS    Home Living Family/patient expects to be discharged to:: Private residence Living Arrangements: Spouse/significant other                  Prior Function            PT Goals (current goals can now be found in the care plan section) Acute Rehab PT Goals Patient Stated Goal: Go home PT Goal Formulation: With patient Time For Goal Achievement: 12/28/16 Potential to Achieve  Goals: Good Progress towards PT goals: Progressing toward goals    Frequency    7X/week      PT Plan      Co-evaluation             End of Session Equipment Utilized During Treatment: Gait belt Activity Tolerance: Patient tolerated treatment well Patient left: in chair;with call bell/phone within reach Nurse  Communication: Mobility status PT Visit Diagnosis: Unsteadiness on feet (R26.81);Pain;Other abnormalities of gait and mobility (R26.89) Pain - Right/Left: Left Pain - part of body: Knee     Time: 0223-3612 PT Time Calculation (min) (ACUTE ONLY): 37 min  Charges:  $Gait Training: 8-22 mins $Therapeutic Exercise: 8-22 mins                    G Codes:  Functional Assessment Tool Used: AM-PAC 6 Clicks Basic Mobility Functional Limitation: Mobility: Walking and moving around Mobility: Walking and Moving Around Current Status (A4497): At least 40 percent but less than 60 percent impaired, limited or restricted Mobility: Walking and Moving Around Goal Status [phone]): At least 1 percent but less than 20 percent impaired, limited or restricted    [place] 12/21/2016, 5:08 PM  Dani Gobble. Migdalia Dk PT, DPT Acute Rehabilitation  [phone] Pager [phone]

## 2016-12-22 DIAGNOSIS — Z471 Aftercare following joint replacement surgery: Secondary | ICD-10-CM | POA: Diagnosis not present

## 2016-12-22 DIAGNOSIS — E1122 Type 2 diabetes mellitus with diabetic chronic kidney disease: Secondary | ICD-10-CM | POA: Diagnosis not present

## 2016-12-22 DIAGNOSIS — I130 Hypertensive heart and chronic kidney disease with heart failure and stage 1 through stage 4 chronic kidney disease, or unspecified chronic kidney disease: Secondary | ICD-10-CM | POA: Diagnosis not present

## 2016-12-22 DIAGNOSIS — N183 Chronic kidney disease, stage 3 (moderate): Secondary | ICD-10-CM | POA: Diagnosis not present

## 2016-12-22 DIAGNOSIS — I2510 Atherosclerotic heart disease of native coronary artery without angina pectoris: Secondary | ICD-10-CM | POA: Diagnosis not present

## 2016-12-22 DIAGNOSIS — I5032 Chronic diastolic (congestive) heart failure: Secondary | ICD-10-CM | POA: Diagnosis not present

## 2016-12-23 DIAGNOSIS — I2510 Atherosclerotic heart disease of native coronary artery without angina pectoris: Secondary | ICD-10-CM | POA: Diagnosis not present

## 2016-12-23 DIAGNOSIS — I130 Hypertensive heart and chronic kidney disease with heart failure and stage 1 through stage 4 chronic kidney disease, or unspecified chronic kidney disease: Secondary | ICD-10-CM | POA: Diagnosis not present

## 2016-12-23 DIAGNOSIS — Z471 Aftercare following joint replacement surgery: Secondary | ICD-10-CM | POA: Diagnosis not present

## 2016-12-23 DIAGNOSIS — I5032 Chronic diastolic (congestive) heart failure: Secondary | ICD-10-CM | POA: Diagnosis not present

## 2016-12-23 DIAGNOSIS — N183 Chronic kidney disease, stage 3 (moderate): Secondary | ICD-10-CM | POA: Diagnosis not present

## 2016-12-23 DIAGNOSIS — E1122 Type 2 diabetes mellitus with diabetic chronic kidney disease: Secondary | ICD-10-CM | POA: Diagnosis not present

## 2016-12-24 DIAGNOSIS — Z471 Aftercare following joint replacement surgery: Secondary | ICD-10-CM | POA: Diagnosis not present

## 2016-12-24 DIAGNOSIS — I2510 Atherosclerotic heart disease of native coronary artery without angina pectoris: Secondary | ICD-10-CM | POA: Diagnosis not present

## 2016-12-24 DIAGNOSIS — I5032 Chronic diastolic (congestive) heart failure: Secondary | ICD-10-CM | POA: Diagnosis not present

## 2016-12-24 DIAGNOSIS — I130 Hypertensive heart and chronic kidney disease with heart failure and stage 1 through stage 4 chronic kidney disease, or unspecified chronic kidney disease: Secondary | ICD-10-CM | POA: Diagnosis not present

## 2016-12-24 DIAGNOSIS — E1122 Type 2 diabetes mellitus with diabetic chronic kidney disease: Secondary | ICD-10-CM | POA: Diagnosis not present

## 2016-12-24 DIAGNOSIS — N183 Chronic kidney disease, stage 3 (moderate): Secondary | ICD-10-CM | POA: Diagnosis not present

## 2016-12-27 DIAGNOSIS — Z471 Aftercare following joint replacement surgery: Secondary | ICD-10-CM | POA: Diagnosis not present

## 2016-12-27 DIAGNOSIS — I130 Hypertensive heart and chronic kidney disease with heart failure and stage 1 through stage 4 chronic kidney disease, or unspecified chronic kidney disease: Secondary | ICD-10-CM | POA: Diagnosis not present

## 2016-12-27 DIAGNOSIS — N183 Chronic kidney disease, stage 3 (moderate): Secondary | ICD-10-CM | POA: Diagnosis not present

## 2016-12-27 DIAGNOSIS — E1122 Type 2 diabetes mellitus with diabetic chronic kidney disease: Secondary | ICD-10-CM | POA: Diagnosis not present

## 2016-12-27 DIAGNOSIS — I5032 Chronic diastolic (congestive) heart failure: Secondary | ICD-10-CM | POA: Diagnosis not present

## 2016-12-27 DIAGNOSIS — I2510 Atherosclerotic heart disease of native coronary artery without angina pectoris: Secondary | ICD-10-CM | POA: Diagnosis not present

## 2016-12-29 DIAGNOSIS — I130 Hypertensive heart and chronic kidney disease with heart failure and stage 1 through stage 4 chronic kidney disease, or unspecified chronic kidney disease: Secondary | ICD-10-CM | POA: Diagnosis not present

## 2016-12-29 DIAGNOSIS — E1122 Type 2 diabetes mellitus with diabetic chronic kidney disease: Secondary | ICD-10-CM | POA: Diagnosis not present

## 2016-12-29 DIAGNOSIS — I5032 Chronic diastolic (congestive) heart failure: Secondary | ICD-10-CM | POA: Diagnosis not present

## 2016-12-29 DIAGNOSIS — I2510 Atherosclerotic heart disease of native coronary artery without angina pectoris: Secondary | ICD-10-CM | POA: Diagnosis not present

## 2016-12-29 DIAGNOSIS — Z471 Aftercare following joint replacement surgery: Secondary | ICD-10-CM | POA: Diagnosis not present

## 2016-12-29 DIAGNOSIS — N183 Chronic kidney disease, stage 3 (moderate): Secondary | ICD-10-CM | POA: Diagnosis not present

## 2016-12-30 DIAGNOSIS — M1712 Unilateral primary osteoarthritis, left knee: Secondary | ICD-10-CM | POA: Diagnosis not present

## 2016-12-30 DIAGNOSIS — M25562 Pain in left knee: Secondary | ICD-10-CM | POA: Diagnosis not present

## 2016-12-30 DIAGNOSIS — Z96652 Presence of left artificial knee joint: Secondary | ICD-10-CM | POA: Diagnosis not present

## 2017-01-03 ENCOUNTER — Other Ambulatory Visit: Payer: Self-pay | Admitting: Vascular Surgery

## 2017-01-03 DIAGNOSIS — IMO0002 Reserved for concepts with insufficient information to code with codable children: Secondary | ICD-10-CM

## 2017-01-03 DIAGNOSIS — T82330A Leakage of aortic (bifurcation) graft (replacement), initial encounter: Secondary | ICD-10-CM

## 2017-01-03 DIAGNOSIS — M25562 Pain in left knee: Secondary | ICD-10-CM | POA: Diagnosis not present

## 2017-01-03 DIAGNOSIS — M1712 Unilateral primary osteoarthritis, left knee: Secondary | ICD-10-CM | POA: Diagnosis not present

## 2017-01-04 ENCOUNTER — Ambulatory Visit
Admission: RE | Admit: 2017-01-04 | Discharge: 2017-01-04 | Disposition: A | Discharge status: Home / Self Care | Payer: Medicare Other | Source: Ambulatory Visit | Attending: Vascular Surgery | Admitting: Vascular Surgery

## 2017-01-04 ENCOUNTER — Other Ambulatory Visit: Payer: Medicare Other

## 2017-01-04 DIAGNOSIS — T82330A Leakage of aortic (bifurcation) graft (replacement), initial encounter: Secondary | ICD-10-CM

## 2017-01-04 DIAGNOSIS — IMO0002 Reserved for concepts with insufficient information to code with codable children: Secondary | ICD-10-CM

## 2017-01-04 HISTORY — PX: IR RADIOLOGIST EVAL & MGMT: IMG5224

## 2017-01-04 NOTE — Consult Note (Signed)
Chief Complaint: Patient was seen in consultation today for  Chief Complaint  Patient presents with  . Advice Only    Consult for Type 2 Endoleak   at the request of Waynetta Sandy  Referring Physician(s): Cain,Brandon Christopher  History of Present Illness: ESGAR BARNICK is a 81 y.o. male who underwent aortobiiliac stent graft for aortic aneurysm exclusion. 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 He was initially treated with coil embolization of the IMA, and follow-up imaging demonstrates a persistent type II endoleak with a stable maximal sac diameter of 5.6 cm. He is asymptomatic. He denies any abdominal pain or changes in his bowel habits. He did undergo left total knee arthroplasty 2 weeks ago and is progressing nicely with therapy. He is ambulatory with a cane. He does have a history of diabetes mellitus and coronary artery disease. He has had coronary artery bypass and coronary stenting. He denies any angina or shortness of breath. He denies peripheral vascular disease. He does not have a nephrologist but does have renal insufficiency.  Past Medical History:  Diagnosis Date  . AAA (abdominal aortic aneurysm) ([place]) 08/30/12   Doppler 08/30/12 had a small amount of growth. It was a 4.2 x 4.3 cm greater in size than the previous one noted at 4 x 3.8.  05-21-15 followed by CT abd-Dr. Kellie Simmering.  . Adenomatous colon polyp   . Ankle edema    Chronic  . Asthma    as a child  . CAD, multiple vessel 1985-2010   Most recent cath 01/01/09: 100% Occluded LAD after SP1, patent LIMA-distal LAD with apical 90% lesion.  Cx-OM1 widely patent stent into proximal bifurcating OM1 . Follow-on Cx => 70-80% -- non-amenable PCI. Widely patent 3 overlapping stents in midRCA with only 40% RPL stenosis; SVG- OM known to be occluded.  . Choledocholithiasis   . Cholelithiasis - with cholangitis    status post ERCP with removal of calculi and biliary stent  placement.  . Chronic anemia    On iron supplement; history of positive guaiac - negative colonoscopy in 1996.; Thought to be related to hemorrhoids; status post hemorrhoidectomy  . Chronic back pain     multiple surgeries; C-spine and lumbar  . Diabetes mellitus    Not on oral medication  . Diverticulitis of colon 1996  . Diverticulosis   . Dyslipidemia, goal LDL below 70   . Erectile dysfunction   . Exertional dyspnea    Chronic baseline SOB with ambulation  . GERD (gastroesophageal reflux disease)   . H/O unstable angina 06/2003, 12/2006, 05/2014   a) '04: Staged Taxus DES PCI to RCA and Cx-OM;;'08 - PCI to proximal ISR in RCA - Cypher DES; c) 05/2014: ISR in RCA & OM1, PCI with Promus P DES: RCA  2.75 x 12, OM1 3.0 mm x 8)  . H/O: pneumonia February 14  . Hemorrhoids   . Hiatal hernia   . History of nuclear stress test December 2013   LOW at Risk. Moderate region of mid to basal inferolateral scar without ischemia -- consistent with distal circumflex disease. Mild apical hypokinesis with an EF of 47%.  Marland Kitchen History of: ST elevation myocardial infarction (STEMI) involving left circumflex coronary artery with complication 7741   PTCA-circumflex; PCI in 1991  . Hypertension   . Irregular heart beat    PVCs and PACs, no arrhythmia recorded  . Moderate aortic stenosis by prior echocardiogram October 2016   Normal LV function -  EF 55-60%.. Abnormal relaxation. Mild-moderate aortic stenosis (peak/mean gradient 18/10 mmH)  . Osteoarthritis of both knees    And back; multiple back surgeries, right knee arthroplasty and left knee arthroscopic surgery x2  . Pneumonia 2016  . S/P CABG x 2 1997   LIMA-LAD, SVG-OM  . Sleep apnea    pt. states he was told to return for f/u, to be fitted for Cpap, but pt. reports that he didn't follow up-no cpap used    Past Surgical History:  Procedure Laterality Date  . Abdominal and Lower Extremity Arterial Ultrasound  08/23/2012; 10/10/2013   Normal ABIs.  Nonocclusive lower extremity disease. 4.2 cm x 4.3 cm infrarenal AAA;; 4.4 cm x 4.3 cm (essentially stable)   . ABDOMINAL AORTIC ENDOVASCULAR STENT GRAFT N/A 08/13/2015   Procedure: ABDOMINAL AORTIC ENDOVASCULAR STENT GRAFT;  Surgeon: Mal Misty, MD;  Location: [facility];  Service: Vascular;  Laterality: N/A;  . Anterior cervical plating  04/23/10   At C4-5 and a C6-7 utilizing two separate Biomet MaxAn plates.  . ANTERIOR LAT LUMBAR FUSION Left 11/22/2012   Procedure: ANTERIOR LATERAL LUMBAR FUSION 1 LEVEL;  Surgeon: Eustace Moore, MD;  Location: [facility];  Service: Neurosurgery;  Laterality: Left;  Anterior Lateral Lumbar Fusion Lumbar Three-Four  . BACK SURGERY  1979 & x 10   pt. remarks, "I have had about 10 back surgeries"  . BILIARY STENT PLACEMENT N/A 07/03/2014   Procedure: BILIARY STENT PLACEMENT;  Surgeon: Inda Castle, MD;  Location: [facility];  Service: Endoscopy;  Laterality: N/A;  . CARDIAC CATHETERIZATION  September 2004   None Occluded vein graft to OM; diffuse RCA disease in the mid vessel, 80% circumflex-OM stenosis; follow on AV groove circumflex with sequential 90% stenoses and intervening saccular dilation   . CARDIAC CATHETERIZATION  March 2010   4 abnormal Myoview showing apical thinning (possibly due to apical LAD 95%) : 100% Occluded LAD after SV1, distal LAD grafted via LIMA -apical 95% . Cx -OM1 w/patent stent extending into OM 1 . Follow on Cx - 70-80% - non-amenable PCI. RCA widely patent 3 overlapping stents in mRCA w/less than 40% stenosisin RPL; SVG-OM known occluded   . CATARACT EXTRACTION    . Cervical arthrodesis  04/23/10   Anterior cervical arthrodesis, C4-5, C6-7 utilizing 7-mm PEEK interbody cage packed with local autograft & Antifuse putty at C4-5 & an 8-mm cage at C6-7.  Marland Kitchen CERVICAL DISCECTOMY  04/23/10   Decompressive anterior carvical diskectomy. C4-5, C6-7  . CHOLECYSTECTOMY N/A 05/23/2015   Procedure: LAPAROSCOPIC CHOLECYSTECTOMY WITH INTRAOPERATIVE  CHOLANGIOGRAM;  Surgeon: Alphonsa Overall, MD;  Location: [facility];  Service: General;  Laterality: N/A;  . [place]  . CORONARY ANGIOPLASTY  9030,[id number]   1985 lateral STEMI Circumflex PTCA  . CORONARY ARTERY BYPASS GRAFT  1990   LIMA-LAD, SVG-OM  . ERCP N/A 07/03/2014   Procedure: ENDOSCOPIC RETROGRADE CHOLANGIOPANCREATOGRAPHY (ERCP);  Surgeon: Inda Castle, MD;  Location: [facility];  Service: Endoscopy;  Laterality: N/A;  . ERCP N/A 07/05/2014   Procedure: ENDOSCOPIC RETROGRADE CHOLANGIOPANCREATOGRAPHY (ERCP);  Surgeon: Inda Castle, MD;  Location: [facility];  Service: Endoscopy;  Laterality: N/A;  . ERCP N/A 06/30/2015   Procedure: ENDOSCOPIC RETROGRADE CHOLANGIOPANCREATOGRAPHY (ERCP);  Surgeon: Ladene Artist, MD;  Location: [facility];  Service: Endoscopy;  Laterality: N/A;  . KNEE ARTHROSCOPY Left    x 2  . LEFT HEART CATHETERIZATION WITH CORONARY ANGIOGRAM N/A 05/15/2014   Procedure: LEFT HEART CATHETERIZATION WITH CORONARY  Cyril Loosen;  Surgeon: Sinclair Grooms, MD;  Location: [facility] C[facility] LAB;  Service: Cardiovascular;  Laterality: N/A;  . LEFT HEART CATHETERIZATION WITH CORONARY/GRAFT ANGIOGRAM N/A 06/28/2014   Procedure: LEFT HEART CATHETERIZATION WITH Beatrix Fetters;  Surgeon: Troy Sine, MD;  Location: [facility] C[facility];  Service: Cardiovascular;  Laterality: N/A;  . LUMBAR PERCUTANEOUS PEDICLE SCREW 1 LEVEL N/A 11/22/2012   Procedure: LUMBAR PERCUTANEOUS PEDICLE SCREW 1 LEVEL;  Surgeon: Eustace Moore, MD;  Location: [facility];  Service: Neurosurgery;  Laterality: N/A;  Lumbar Three-Four Percutaneous Pedicle Screw, Lateral approach  . MINOR HEMORRHOIDECTOMY    . NM MYOVIEW LTD  December 2013   LOW RISK. Mmoderate region of mid to basal inferolateral scar without ischemia. Mild apical hypokinesis with an EF of 47%.  Marland Kitchen PERCUTANEOUS CORONARY STENT INTERVENTION (PCI-S)  September 2004   PCI - RCA 2 overlapping Taxus DES 2.75 mm x 32 mm and 2.75 mm x 12 mm (3.0 mm);  PCI-Cx-OM1 - Taxus DES 3.0 mm x 20 mm (3.1 mm);   Marland Kitchen PERCUTANEOUS CORONARY STENT INTERVENTION (PCI-S)  March 2008   80% ISR in proximal Taxus stent in RCA -- covered proximally with Cypher DES 3.0 mm x 12 mm  . PERCUTANEOUS CORONARY STENT INTERVENTION (PCI-S) N/A 05/17/2014   Procedure: PERCUTANEOUS CORONARY STENT INTERVENTION (PCI-S);  Surgeon: Sinclair Grooms, MD;  Location: [facility];  Service: Cardiovascular;  Laterality: N/A;  . PERIPHERAL VASCULAR CATHETERIZATION  11/03/2016   Procedure: Embolization;  Surgeon: Waynetta Sandy, MD;  Location: [facility] [facility];  Service: Cardiovascular;;  . POSTERIOR CERVICAL FUSION/FORAMINOTOMY N/A 05/02/2013   Procedure: POSTERIOR CERVICAL FUSION/FORAMINOTOMY CERVICAL SEVEN THORACIC-ONE;  Surgeon: Eustace Moore, MD;  Location: [place] [facility];  Service: Neurosurgery;  Laterality: N/A;  POSTERIOR CERVICAL FUSION/FORAMINOTOMY CERVICAL SEVEN THORACIC-ONE  . SPHINCTEROTOMY N/A 06/30/2015   Procedure: SPHINCTEROTOMY;  Surgeon: Ladene Artist, MD;  Location: [facility];  Service: Endoscopy;  Laterality: N/A;  . TOTAL KNEE ARTHROPLASTY Right   . TOTAL KNEE ARTHROPLASTY Left 12/20/2016   Procedure: LEFT TOTAL KNEE ARTHROPLASTY;  Surgeon: Vickey Huger, MD;  Location: [facility];  Service: Orthopedics;  Laterality: Left;  . TRANSTHORACIC ECHOCARDIOGRAM  December 2013; October 2016   a. EF 55-60%. mod LA dilation. Aortic Sclerosis;; b. EF 55-60%, Gr 1 DD, Mild-Mod AS (Peark - Mean Gradient 18-10 mmHg)  . TRANSTHORACIC ECHOCARDIOGRAM  07/2016   EF 45-50%. Inferolateral hypokinesis (correlates with scar noted on Myoview). GR 1 DD. Mild aortic stenosis. Mod LAE.  Marland Kitchen VISCERAL ANGIOGRAM  11/03/2016   Procedure: Visceral Angiogram;  Surgeon: Waynetta Sandy, MD;  Location: [place] [facility];  Service: Cardiovascular;;    Allergies: Lisinopril; Statins; and Welchol [colesevelam hcl]  Medications: Prior to Admission medications   Medication Sig Start Date  End Date Taking? Authorizing Provider  albuterol (PROVENTIL HFA;VENTOLIN HFA) 108 (90 BASE) MCG/ACT inhaler Inhale 2 puffs into the lungs every 6 (six) hours as needed for wheezing or shortness of breath. 09/06/15  Yes Camelia Eng Tysinger, PA-C  amLODipine (NORVASC) 5 MG tablet TAKE 5 MG BY MOUTH DAILY....need appointment before further refills Patient taking differently: Take 5 mg by mouth daily.  09/07/16  Yes Leonie Man, MD  Choline Fenofibrate (FENOFIBRIC ACID) 135 MG CPDR TAKE ONE CAPSULE BY MOUTH ONCE DAILY Patient taking differently: TAKE ONE CAPSULE BY MOUTH ONCE DAILY IN THE EVENING 02/13/16  Yes Leonie Man, MD  ferrous sulfate 325 (65 FE) MG tablet Take 325 mg  by mouth daily with breakfast.   Yes Historical Provider, MD  glucose blood test strip Pt uses one touch verio test strips and tests twice daily. Patient taking differently: 1 each by Other route every other day.  12/18/15  Yes Denita Lung, MD  HYDROcodone-acetaminophen (NORCO/VICODIN) 5-325 MG tablet Take 1 tablet by mouth every 6 (six) hours as needed for moderate pain.   Yes Historical Provider, MD  losartan (COZAAR) 100 MG tablet Take 1 tablet (100 mg total) by mouth daily. 02/02/16  Yes Leonie Man, MD  methocarbamol (ROBAXIN) 500 MG tablet Take 1-2 tablets (500-1,000 mg total) by mouth every 6 (six) hours as needed for muscle spasms. 12/21/16  Yes Donia Ast, PA  Multiple Vitamins-Minerals (MULTIVITAMIN WITH MINERALS) tablet Take 1 tablet by mouth daily.    Yes Historical Provider, MD  pantoprazole (PROTONIX) 40 MG tablet TAKE ONE TABLET BY MOUTH ONCE DAILY 02/20/16  Yes Leonie Man, MD  pioglitazone-metformin (ACTOPLUS MET) 15-500 MG tablet TAKE ONE TABLET BY MOUTH ONCE DAILY 11/17/15  Yes Denita Lung, MD  sildenafil (REVATIO) 20 MG tablet TAKE 2-5 TABLETS BY MOUTH DAILY AS NEEDED Patient taking differently: TAKE 2-5 TABLETS BY MOUTH DAILY AS NEEDED FOR ERECTILE DYSFUNCTION 02/19/16  Yes Denita Lung, MD    acetaminophen (TYLENOL) 500 MG tablet Take 1,000 mg by mouth every 6 (six) hours as needed for mild pain, moderate pain or headache. Reported on 03/16/2016    Historical Provider, MD  aspirin EC 325 MG EC tablet Take 1 tablet (325 mg total) by mouth daily with breakfast. Patient not taking: Reported on 01/04/2017 12/21/16   Donia Ast, PA  clopidogrel (PLAVIX) 75 MG tablet TAKE 75 MG BY MOUTH ONCE DAILY Patient not taking: Reported on 01/04/2017 02/09/16   Leonie Man, MD  oxyCODONE 10 MG TABS Take 1 tablet (10 mg total) by mouth every 3 (three) hours as needed for breakthrough pain. Patient not taking: Reported on 01/04/2017 12/21/16   Donia Ast, PA     Family History  Problem Relation Age of Onset  . Arthritis Mother   . Diabetes Father   . Heart disease Father   . Stroke Sister   . Hypertension Sister   . Heart disease Sister   . Diabetes Sister   . Breast cancer Sister   . Heart disease Brother   . Ulcers Brother   . Colon cancer Neg Hx     Social History   Social History  . Marital status: Married    Spouse name: N/A  . Number of children: N/A  . Years of education: N/A   Social History Main Topics  . Smoking status: Former Smoker    Types: Cigarettes    Quit date: 10/05/1983  . Smokeless tobacco: Never Used  . Alcohol use 0.6 oz/week    1 Glasses of wine per week     Comment:  4 oz. wine daily  . Drug use: No  . Sexual activity: Not Currently   Other Topics Concern  . Not on file   Social History Narrative   He is married, father of two, grandfather to 33, great grandfather to two.    Not really getting much exercise now, do to his significant back and hip pain.    He does not smoke and only has an alcoholic beverage.     Review of Systems: A 12 point ROS discussed and pertinent positives are indicated in the HPI above.  All  other systems are negative.  Review of Systems  Vital Signs: BP 140/76 (BP Location: Left Arm, Patient Position:  Sitting, Cuff Size: Large)   Pulse 94   Temp 97.7 F (36.5 C) (Oral)   Resp 16   Ht '5\' 9"'  (1.753 m)   Wt 214 lb (97.1 kg)   SpO2 97%   BMI 31.60 kg/m   Physical Exam  Constitutional: He is oriented to person, place, and time. He appears well-developed and well-nourished.  HENT:  Head: Normocephalic and atraumatic.  Cardiovascular: Normal rate and regular rhythm.   Femoral pulses are 2+ bilaterally.  Dorsalis pedis pulses are 2+ on the right and 1+ on the left. Posterior tibial pulses are 1+ bilaterally.  Pulmonary/Chest: Effort normal.  Mild expiratory wheezing.  Neurological: He is alert and oriented to person, place, and time.     Imaging: Ct Angio Abdomen Pelvis  W &/or Wo Contrast  Result Date: 12/17/2016 CLINICAL DATA:  Aortic stent graft follow-up EXAM: CTA ABDOMEN AND PELVIS wITHOUT AND WITH CONTRAST TECHNIQUE: Multidetector CT imaging of the abdomen and pelvis was performed using the standard protocol during bolus administration of intravenous contrast. Multiplanar reconstructed images and MIPs were obtained and reviewed to evaluate the vascular anatomy. CONTRAST:  75 cc Isovue 370 COMPARISON:  10/07/2016 FINDINGS: VASCULAR Aorta: Aorto bi-iliac stent graft is stable in position. Maximal sac diameter is stable at 5.6 x 4.5 cm. The type 2 endoleak is again noted emanating from a right L4 lumbar artery. Embolization coils have been placed in the IMA. There is some enhancement in the IMA stump above the coils likely emanating from the endoleak. IMA branches are patent. Celiac: Patent. SMA: Patent and ectatic. Renals: There are atherosclerotic changes at the origin of the renal arteries. No significant narrowing. A tiny accessory right lower pole renal artery has been sacrificed. IMA: Coil embolization of the proximal IMA is noted. Branches reconstitute. The IMA stump opacifies likely from the endoleak. Inflow: Mid right common iliac artery landing zone is patent. Common and external  iliac arteries are patent. Internal iliac artery is patent. Mid left common iliac artery landing zone is patent. Left common, internal, and external iliac arteries are patent. Veins: Renal veins are patent. IVC is patent. Common iliac veins are patent. Review of the MIP images confirms the above findings. NON-VASCULAR Lower chest: Dependent atelectasis. Hepatobiliary: There is focal transient enhancement in the inferior right lobe of the liver are. See image 58 of series 5. This resolves and most likely represents a vascular phenomenon. No definite liver mass. Postcholecystectomy. Pancreas: Unremarkable. Spleen: Unremarkable. Adrenals/Urinary Tract: Fat containing lesion in the left adrenal gland is stable. Right adrenal gland is normal. Left kidney is unremarkable. Atrophy of the right kidney is present. Stomach/Bowel: Moderate size hiatal hernia. No obvious mass in the colon. Normal appendix. Diverticulosis of the colon. No evidence of small-bowel obstruction. Lymphatic: No evidence of retroperitoneal adenopathy. Reproductive: Prostate remains mildly prominent.  Normal bladder. Other: No free-fluid. Musculoskeletal: No vertebral compression deformity. Lumbar fusion hardware is in place. There is significant spinal stenosis just above the fusion level. IMPRESSION: VASCULAR After embolization of the IMA, the type 2 endoleak persists and sac diameter is stable with a maximal diameter of 5.6 x 4.5 cm. The culprit is a persistent lumbar artery. NON-VASCULAR Chronic changes. Electronically Signed   By: Marybelle Killings M.D.   On: 12/17/2016 12:07    Labs:  CBC:  Recent Labs  11/03/16 1009 12/09/16 1047 12/21/16 0500  WBC  --  3.6* 6.6  HGB 13.3 12.1* 9.8*  HCT 39.0 36.7* 30.1*  PLT  --  200 126*    COAGS: No results for input(s): INR, APTT in the last 8760 hours.  BMP:  Recent Labs  03/04/16 0942 11/03/16 1009 12/09/16 1047 12/21/16 0500  NA  --  140 139 136  K  --  4.0 4.3 4.7  CL  --  108  109 104  CO2  --   --  24 24  GLUCOSE  --  101* 94 127*  BUN  --  27* 20 21*  CALCIUM  --   --  9.3 8.7*  CREATININE 1.65* 1.50* 1.46* 1.55*  GFRNONAA  --   --  44* 41*  GFRAA  --   --  51* 48*    LIVER FUNCTION TESTS:  Recent Labs  03/02/16 1215 06/10/16 0904 11/09/16 0922 12/09/16 1047  BILITOT 0.7 0.6 0.7 0.7  AST '30 20 23 26  ' ALT '22 15 19 19  ' ALKPHOS 30* 32* 28* 27*  PROT 6.4 6.3 6.4 6.7  ALBUMIN 4.1 3.9 4.0 3.6    TUMOR MARKERS: No results for input(s): AFPTM, CEA, CA199, CHROMGRNA in the last 8760 hours.  Assessment and Plan:  Mr. Saadeh is suffering from a persistent type II endoleak after aortobiiliac stent grafting, despite successful embolization of the IMA. The CT angiogram does demonstrate a lumbar vessel contributing to the endoleak. This should be accessible via the right internal iliac artery. This is probably the inflow vessel. Hopefully both the inflow and outflow vessel can be identified followed by embolization. Otherwise, sac embolization utilizing coils and possibly onyx may be considered during the procedure. If this is unsuccessful, ultimately, direct sac puncture can be considered on a future date. Preferably, the Plavix should be stopped prior to the procedure. The initial angiogram procedure was discussed. The patient's questions were answered. He understands and consents.  Thank you for this interesting consult.  I greatly enjoyed meeting JASANI DOLNEY and look forward to participating in their care.  A copy of this report was sent to the requesting provider on this date.  Electronically Signed: Sakina Briones, ART A 01/04/2017, 2:51 PM   I spent a total of  60 Minutes   in face to face in clinical consultation, greater than 50% of which was counseling/coordinating care for type II endoleak treatment.

## 2017-01-06 ENCOUNTER — Other Ambulatory Visit (HOSPITAL_COMMUNITY): Payer: Self-pay | Admitting: Interventional Radiology

## 2017-01-06 ENCOUNTER — Telehealth: Payer: Self-pay

## 2017-01-06 DIAGNOSIS — T82330D Leakage of aortic (bifurcation) graft (replacement), subsequent encounter: Secondary | ICD-10-CM

## 2017-01-06 DIAGNOSIS — M25562 Pain in left knee: Secondary | ICD-10-CM | POA: Diagnosis not present

## 2017-01-06 DIAGNOSIS — IMO0001 Reserved for inherently not codable concepts without codable children: Secondary | ICD-10-CM

## 2017-01-06 DIAGNOSIS — M1712 Unilateral primary osteoarthritis, left knee: Secondary | ICD-10-CM | POA: Diagnosis not present

## 2017-01-06 NOTE — Telephone Encounter (Signed)
Fax rcvd from [facility] to hold Plavix for 5 days prior to endoleak type 2 procedure. OK for pt to hold Plavix for 5 days per Dr. Redmond School.  Called Vickie at IR to notify her. Victorino December

## 2017-01-11 DIAGNOSIS — M25562 Pain in left knee: Secondary | ICD-10-CM | POA: Diagnosis not present

## 2017-01-11 DIAGNOSIS — M1712 Unilateral primary osteoarthritis, left knee: Secondary | ICD-10-CM | POA: Diagnosis not present

## 2017-01-12 ENCOUNTER — Telehealth: Payer: Self-pay | Admitting: Family Medicine

## 2017-01-12 MED ORDER — ALBUTEROL SULFATE HFA 108 (90 BASE) MCG/ACT IN AERS: 2.0000 | INHALATION_SPRAY | Freq: Four times a day (QID) | RESPIRATORY_TRACT | 0 refills | Status: DC | PRN

## 2017-01-12 NOTE — Telephone Encounter (Signed)
Pt called for refills of proair.please send to [facility] on [address]

## 2017-01-13 ENCOUNTER — Other Ambulatory Visit (HOSPITAL_COMMUNITY): Payer: Medicare Other

## 2017-01-13 DIAGNOSIS — M25562 Pain in left knee: Secondary | ICD-10-CM | POA: Diagnosis not present

## 2017-01-13 DIAGNOSIS — M1712 Unilateral primary osteoarthritis, left knee: Secondary | ICD-10-CM | POA: Diagnosis not present

## 2017-01-14 ENCOUNTER — Encounter (HOSPITAL_COMMUNITY)
Admission: RE | Admit: 2017-01-14 | Discharge: 2017-01-14 | Disposition: A | Discharge status: Home / Self Care | Payer: Medicare Other | Source: Ambulatory Visit | Attending: Interventional Radiology | Admitting: Interventional Radiology

## 2017-01-14 ENCOUNTER — Encounter (HOSPITAL_COMMUNITY): Payer: Self-pay

## 2017-01-14 DIAGNOSIS — Z01812 Encounter for preprocedural laboratory examination: Secondary | ICD-10-CM | POA: Diagnosis not present

## 2017-01-14 DIAGNOSIS — T82330A Leakage of aortic (bifurcation) graft (replacement), initial encounter: Secondary | ICD-10-CM | POA: Insufficient documentation

## 2017-01-14 LAB — BASIC METABOLIC PANEL
Anion gap: 7 (ref 5–15)
BUN: 20 mg/dL (ref 6–20)
CHLORIDE: 108 mmol/L (ref 101–111)
CO2: 22 mmol/L (ref 22–32)
CREATININE: 1.36 mg/dL — AB (ref 0.61–1.24)
Calcium: 9.2 mg/dL (ref 8.9–10.3)
GFR calc non Af Amer: 48 mL/min — ABNORMAL LOW (ref >60)
GFR, EST AFRICAN AMERICAN: 56 mL/min — AB (ref >60)
GLUCOSE: 110 mg/dL — AB (ref 65–99)
Potassium: 4 mmol/L (ref 3.5–5.1)
SODIUM: 137 mmol/L (ref 135–145)

## 2017-01-14 LAB — CBC
HCT: 32.6 % — ABNORMAL LOW (ref 39.0–52.0)
HEMOGLOBIN: 10.6 g/dL — AB (ref 13.0–17.0)
MCH: 30.5 pg (ref 26.0–34.0)
MCHC: 32.5 g/dL (ref 30.0–36.0)
MCV: 93.7 fL (ref 78.0–100.0)
PLATELETS: 216 10*3/uL (ref 150–400)
RBC: 3.48 MIL/uL — AB (ref 4.22–5.81)
RDW: 15.1 % (ref 11.5–15.5)
WBC: 5.7 10*3/uL (ref 4.0–10.5)

## 2017-01-14 LAB — PROTIME-INR
INR: 1.1
Prothrombin Time: 14.2 seconds (ref 11.4–15.2)

## 2017-01-14 LAB — GLUCOSE, CAPILLARY: Glucose-Capillary: 105 mg/dL — ABNORMAL HIGH (ref 65–99)

## 2017-01-14 NOTE — Pre-Procedure Instructions (Addendum)
CHIP CANEPA  01/14/2017      [place] ([place]), [place] - [address] [place] ([place] [address] Phone: [phone] Fax: [phone]  [place], [address] [place] [address] Phone: [phone] Fax: [phone]  [facility] [place], [address] AT [address] [address] Phone: [phone] Fax: [phone]    Your procedure is scheduled on 01/18/17  Report to [facility] Admitting at 600 A.M.  Call this number if you have problems the morning of surgery:  [phone]   Remember:  Do not eat food or drink liquids after midnight.  Take these medicines the morning of surgery with A SIP OF WATER    Inhaler if needed(bring albuterol), amlodipine,protonix, hydrocodone if needed  STOP all herbel meds, nsaids (aleve,naproxen,advil,ibuprofen) Starting today including all vitamins/supplements  Stop plavix per dr (01/12/17)  No actoplus day of surgery     How to Manage Your Diabetes Before and After Surgery  Why is it important to control my blood sugar before and after surgery? . Improving blood sugar levels before and after surgery helps healing and can limit problems. . A way of improving blood sugar control is eating a healthy diet by: o  Eating less sugar and carbohydrates o  Increasing activity/exercise o  Talking with your doctor about reaching your blood sugar goals . High blood sugars (greater than 180 mg/dL) can raise your risk of infections and slow your recovery, so you will need to focus on controlling your diabetes during the weeks before surgery. . Make sure that the doctor who takes care of your diabetes knows about your planned surgery including the date and location.  How do I manage my blood sugar before surgery? . Check your blood sugar at  least 4 times a day, starting 2 days before surgery, to make sure that the level is not too high or low. o Check your blood sugar the morning of your surgery when you wake up and every 2 hours until you get to the Short Stay unit. . If your blood sugar is less than 70 mg/dL, you will need to treat for low blood sugar: o Do not take insulin. o Treat a low blood sugar (less than 70 mg/dL) with  cup of clear juice (cranberry or apple), 4 glucose tablets, OR glucose gel. o Recheck blood sugar in 15 minutes after treatment (to make sure it is greater than 70 mg/dL). If your blood sugar is not greater than 70 mg/dL on recheck, call [phone] for further instructions. . Report your blood sugar to the short stay nurse when you get to Short Stay.  . If you are admitted to the hospital after surgery: o Your blood sugar will be checked by the staff and you will probably be given insulin after surgery (instead of oral diabetes medicines) to make sure you have good blood sugar levels. o The goal for blood sugar control after surgery is 80-180 mg/dL.   WHAT DO I DO ABOUT MY DIABETES MEDICATION?   Marland Kitchen Do not take oral diabetes medicines (pills) the morning of surgery.(actoplus)    Do not wear jewelry, make-up or nail polish.  Do not wear lotions, powders, or perfumes, or deoderant.  Do not shave 48 hours prior to surgery.  Men  may shave face and neck.  Do not bring valuables to the hospital.  [facility] is not responsible for any belongings or valuables.  Contacts, dentures or bridgework may not be worn into surgery.  Leave your suitcase in the car.  After surgery it may be brought to your room.  For patients admitted to the hospital, discharge time will be determined by your treatment team.  Patients discharged the day of surgery will not be allowed to drive home.   Special instructions:   Special Instructions: [place] - Preparing for Surgery  Before surgery, you can play an important role.   Because skin is not sterile, your skin needs to be as free of germs as possible.  You can reduce the number of germs on you skin by washing with CHG (chlorahexidine gluconate) soap before surgery.  CHG is an antiseptic cleaner which kills germs and bonds with the skin to continue killing germs even after washing.  Please DO NOT use if you have an allergy to CHG or antibacterial soaps.  If your skin becomes reddened/irritated stop using the CHG and inform your nurse when you arrive at Short Stay.  Do not shave (including legs and underarms) for at least 48 hours prior to the first CHG shower.  You may shave your face.  Please follow these instructions carefully:   1.  Shower with CHG Soap the night before surgery and the morning of Surgery.  2.  If you choose to wash your hair, wash your hair first as usual with your normal shampoo.  3.  After you shampoo, rinse your hair and body thoroughly to remove the Shampoo.  4.  Use CHG as you would any other liquid soap.  You can apply chg directly  to the skin and wash gently with scrungie or a clean washcloth.  5.  Apply the CHG Soap to your body ONLY FROM THE NECK DOWN.  Do not use on open wounds or open sores.  Avoid contact with your eyes ears, mouth and genitals (private parts).  Wash genitals (private parts)       with your normal soap.  6.  Wash thoroughly, paying special attention to the area where your surgery will be performed.  7.  Thoroughly rinse your body with warm water from the neck down.  8.  DO NOT shower/wash with your normal soap after using and rinsing off the CHG Soap.  9.  Pat yourself dry with a clean towel.            10.  Wear clean pajamas.            11.  Place clean sheets on your bed the night of your first shower and do not sleep with pets.  Day of Surgery  Do not apply any lotions/deodorants the morning of surgery.  Please wear clean clothes to the hospital/surgery center.  Please read over the fact sheets that you  were given.

## 2017-01-16 ENCOUNTER — Other Ambulatory Visit: Payer: Self-pay | Admitting: Radiology

## 2017-01-17 ENCOUNTER — Encounter (HOSPITAL_COMMUNITY): Payer: Self-pay

## 2017-01-17 ENCOUNTER — Other Ambulatory Visit: Payer: Self-pay | Admitting: Radiology

## 2017-01-17 ENCOUNTER — Encounter (HOSPITAL_COMMUNITY): Payer: Self-pay | Admitting: Vascular Surgery

## 2017-01-17 ENCOUNTER — Encounter (HOSPITAL_COMMUNITY): Payer: Self-pay | Admitting: Certified Registered Nurse Anesthetist

## 2017-01-17 DIAGNOSIS — M25562 Pain in left knee: Secondary | ICD-10-CM | POA: Diagnosis not present

## 2017-01-17 DIAGNOSIS — M1712 Unilateral primary osteoarthritis, left knee: Secondary | ICD-10-CM | POA: Diagnosis not present

## 2017-01-17 NOTE — Progress Notes (Signed)
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  - Cardiologist is Glenetta Hew, MD, last visit 10/29/16 for following and pre-operative evaluation prior to left TKA.   - PCP is Jill Alexanders, MD, last office visit 11/23/16 with Chana Bode, PA for acute respiratory infection.  - Vascular surgeon is Servando Snare, MD  [facility] includes:lateral STEMI '95 s/p PCI, CAD (S/p CABG 1997 (LIMA-LAD, SVG-OM), s/p multiple stents/PCI, most recent was stenting to OM and RCA 05/17/14), irregular heart beat (PVCs, PACs), aortic stenosis (mild 07/2016; rec: repeat ~ 2 years), HTN, DM2, hyperlipidemia, anemia, AAA (s/p EVAR 08/13/15; type II endoleak with coil embolization of IMA 11/03/16), OSA (no CPAP), exertion dyspnea, GERD, hiatal hernia, chronic pain, C7-T1 posterior fusion 05/02/13, L3-4 ALIF 11/22/12, cholecystectom y 05/23/15, left TKA 12/20/16. Former smoker. BMI 32. Hx alcohol abuse, reports now drinks 4oz wine daily.   Medications include: Albuterol, amlodipine, ASA 325 mg (not taking currently), fenofibrate, Plavix (okay to hold 5 days prior to procedure per Dr. Redmond School; last dose 01/12/17), 65 Fe, Norco, losartan, Robaxin, Protonix, pioglitazone-metformin, sildenafil.   BP 139/67   Pulse 79   Temp 36.4 C   Resp 18   Ht 5\' 9"  (1.753 m)   Wt 214 lb (97.1 kg)   SpO2 100%   BMI 31.60 kg/m   Preoperative labs reviewed. H/H 10.6/32.6, up from 9.8/30.1 (post TKA). Cr 1.36, down from 1.55 on 12/21/16. Glucose 110. A1c 5.8 on 12/09/16.   CXR 12/01/16: Impression: There is no pneumonia, CHF, nor other acute cardiopulmonary abnormality. Thoracic aortic atherosclerosis.  CTA abd/pelvis 12/17/16: IMPRESSION: VASCULAR After embolization of the IMA, the type 2 endoleak persists and sac diameter is stable with a maximal diameter of 5.6 x 4.5 cm. 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 NON-VASCULAR Chronic changes.  EKG 10/27/16: Sinus rhythm with first-degree AV block. Possible inferior infarct, age undetermined.  Echo 07/19/16: Study Conclusions: - Left ventricle: The cavity size was mildly dilated. Wall thickness was normal. Systolic function was mildly reduced. Theestimated ejection fraction was in the range of 45% to 50%. Thereis hypokinesis of the inferolateral myocardium. Dopplerparameters are consistent with abnormal left ventricularrelaxation (grade 1 diastolic dysfunction). - Aortic valve: There was mild stenosis. Valve area (VTI): 1.58 cm^2. Valve area (Vmax): 1.61 cm^2. Valve area (Vmean): 1.49cm^2. - Mitral valve: Calcified annulus. - Left atrium: The atrium was moderately dilated. - Right atrium: The atrium was mildly dilated.  Cardiac cath 06/28/14: Summary: 1. LM: Normal 2. LAD: Totally occluded after septal perforating artery and small diagonal vessel proximally. 3. CX: Widely patent stent proximally extending into the midportion of the OM. No in-stent restenosis. AV groove CX which arose from stented segment small caliber and had 80% ostial stenosis followed by an ectatic segment. 4. RCA: Widely patent tandems stents commencing proximally and extending into the region of the acute margin without in-stent restenosis. RCA supplied the PDA vessel. Mild 10-20% narrowing in the continuation branch beyond the PDA takeoff. 5. LIMA to LAD: Widely patent anastomosis into the mid LAD. Filling of the LAD retrograde to the proximal occluded segment of the LAD beyond the anastomosis widely patent 6. SVG to OM: Occluded at the ostium, which is old.  If no acute changes then I anticipate that he can proceed as planned.  [facility] Short Stay Center/Anesthesiology Phone [phone] 01/17/2017 12:13 PM

## 2017-01-18 ENCOUNTER — Other Ambulatory Visit (HOSPITAL_COMMUNITY): Payer: Self-pay | Admitting: Interventional Radiology

## 2017-01-18 ENCOUNTER — Encounter (HOSPITAL_COMMUNITY): Admission: RE | Payer: Self-pay | Source: Ambulatory Visit

## 2017-01-18 ENCOUNTER — Encounter: Payer: Self-pay | Admitting: Cardiology

## 2017-01-18 ENCOUNTER — Ambulatory Visit (HOSPITAL_COMMUNITY)
Admission: RE | Admit: 2017-01-18 | Discharge: 2017-01-18 | Disposition: A | Discharge status: Home / Self Care | Payer: Medicare Other | Source: Ambulatory Visit | Attending: Interventional Radiology | Admitting: Interventional Radiology

## 2017-01-18 ENCOUNTER — Encounter (HOSPITAL_COMMUNITY): Payer: Self-pay | Admitting: Interventional Radiology

## 2017-01-18 ENCOUNTER — Ambulatory Visit (HOSPITAL_COMMUNITY)
Admission: RE | Admit: 2017-01-18 | Payer: Medicare Other | Source: Ambulatory Visit | Admitting: Interventional Radiology

## 2017-01-18 DIAGNOSIS — T82330A Leakage of aortic (bifurcation) graft (replacement), initial encounter: Secondary | ICD-10-CM | POA: Diagnosis not present

## 2017-01-18 DIAGNOSIS — Z87891 Personal history of nicotine dependence: Secondary | ICD-10-CM | POA: Insufficient documentation

## 2017-01-18 DIAGNOSIS — Y832 Surgical operation with anastomosis, bypass or graft as the cause of abnormal reaction of the patient, or of later complication, without mention of misadventure at the time of the procedure: Secondary | ICD-10-CM | POA: Diagnosis not present

## 2017-01-18 DIAGNOSIS — E119 Type 2 diabetes mellitus without complications: Secondary | ICD-10-CM | POA: Insufficient documentation

## 2017-01-18 DIAGNOSIS — I714 Abdominal aortic aneurysm, without rupture: Secondary | ICD-10-CM | POA: Insufficient documentation

## 2017-01-18 DIAGNOSIS — Z8249 Family history of ischemic heart disease and other diseases of the circulatory system: Secondary | ICD-10-CM | POA: Diagnosis not present

## 2017-01-18 DIAGNOSIS — IMO0001 Reserved for inherently not codable concepts without codable children: Secondary | ICD-10-CM

## 2017-01-18 DIAGNOSIS — Z951 Presence of aortocoronary bypass graft: Secondary | ICD-10-CM | POA: Diagnosis not present

## 2017-01-18 DIAGNOSIS — Z7982 Long term (current) use of aspirin: Secondary | ICD-10-CM | POA: Diagnosis not present

## 2017-01-18 DIAGNOSIS — Z7902 Long term (current) use of antithrombotics/antiplatelets: Secondary | ICD-10-CM | POA: Diagnosis not present

## 2017-01-18 DIAGNOSIS — M549 Dorsalgia, unspecified: Secondary | ICD-10-CM | POA: Diagnosis not present

## 2017-01-18 DIAGNOSIS — I2510 Atherosclerotic heart disease of native coronary artery without angina pectoris: Secondary | ICD-10-CM | POA: Insufficient documentation

## 2017-01-18 DIAGNOSIS — G8929 Other chronic pain: Secondary | ICD-10-CM | POA: Diagnosis not present

## 2017-01-18 DIAGNOSIS — Z955 Presence of coronary angioplasty implant and graft: Secondary | ICD-10-CM | POA: Insufficient documentation

## 2017-01-18 DIAGNOSIS — Z7984 Long term (current) use of oral hypoglycemic drugs: Secondary | ICD-10-CM | POA: Diagnosis not present

## 2017-01-18 DIAGNOSIS — I2582 Chronic total occlusion of coronary artery: Secondary | ICD-10-CM | POA: Insufficient documentation

## 2017-01-18 DIAGNOSIS — K449 Diaphragmatic hernia without obstruction or gangrene: Secondary | ICD-10-CM | POA: Insufficient documentation

## 2017-01-18 DIAGNOSIS — E785 Hyperlipidemia, unspecified: Secondary | ICD-10-CM | POA: Insufficient documentation

## 2017-01-18 DIAGNOSIS — I252 Old myocardial infarction: Secondary | ICD-10-CM | POA: Diagnosis not present

## 2017-01-18 DIAGNOSIS — T82330D Leakage of aortic (bifurcation) graft (replacement), subsequent encounter: Secondary | ICD-10-CM

## 2017-01-18 DIAGNOSIS — G4730 Sleep apnea, unspecified: Secondary | ICD-10-CM | POA: Diagnosis not present

## 2017-01-18 DIAGNOSIS — K219 Gastro-esophageal reflux disease without esophagitis: Secondary | ICD-10-CM | POA: Diagnosis not present

## 2017-01-18 DIAGNOSIS — I10 Essential (primary) hypertension: Secondary | ICD-10-CM | POA: Insufficient documentation

## 2017-01-18 DIAGNOSIS — T82898A Other specified complication of vascular prosthetic devices, implants and grafts, initial encounter: Secondary | ICD-10-CM | POA: Diagnosis not present

## 2017-01-18 HISTORY — PX: IR AORTAGRAM ABDOMINAL SERIALOGRAM: IMG636

## 2017-01-18 HISTORY — PX: IR US GUIDE VASC ACCESS RIGHT: IMG2390

## 2017-01-18 HISTORY — PX: IR ANGIOGRAM SELECTIVE EACH ADDITIONAL VESSEL: IMG667

## 2017-01-18 HISTORY — PX: IR ANGIOGRAM PELVIS SELECTIVE OR SUPRASELECTIVE: IMG661

## 2017-01-18 HISTORY — PX: IR EMBO ARTERIAL NOT HEMORR HEMANG INC GUIDE ROADMAPPING: IMG5448

## 2017-01-18 HISTORY — PX: IR ANGIOGRAM EXTREMITY LEFT: IMG651

## 2017-01-18 LAB — CBC WITH DIFFERENTIAL/PLATELET
BASOS ABS: 0 10*3/uL (ref 0.0–0.1)
Basophils Relative: 1 %
Eosinophils Absolute: 0.1 10*3/uL (ref 0.0–0.7)
Eosinophils Relative: 2 %
HEMATOCRIT: 33.7 % — AB (ref 39.0–52.0)
Hemoglobin: 10.8 g/dL — ABNORMAL LOW (ref 13.0–17.0)
LYMPHS ABS: 1.8 10*3/uL (ref 0.7–4.0)
LYMPHS PCT: 29 %
MCH: 30 pg (ref 26.0–34.0)
MCHC: 32 g/dL (ref 30.0–36.0)
MCV: 93.6 fL (ref 78.0–100.0)
MONO ABS: 0.5 10*3/uL (ref 0.1–1.0)
MONOS PCT: 8 %
NEUTROS ABS: 3.7 10*3/uL (ref 1.7–7.7)
Neutrophils Relative %: 60 %
Platelets: 188 10*3/uL (ref 150–400)
RBC: 3.6 MIL/uL — ABNORMAL LOW (ref 4.22–5.81)
RDW: 15 % (ref 11.5–15.5)
WBC: 6 10*3/uL (ref 4.0–10.5)

## 2017-01-18 LAB — BASIC METABOLIC PANEL
ANION GAP: 10 (ref 5–15)
BUN: 24 mg/dL — ABNORMAL HIGH (ref 6–20)
CO2: 25 mmol/L (ref 22–32)
Calcium: 10 mg/dL (ref 8.9–10.3)
Chloride: 104 mmol/L (ref 101–111)
Creatinine, Ser: 1.54 mg/dL — ABNORMAL HIGH (ref 0.61–1.24)
GFR calc non Af Amer: 41 mL/min — ABNORMAL LOW (ref >60)
GFR, EST AFRICAN AMERICAN: 48 mL/min — AB (ref >60)
GLUCOSE: 105 mg/dL — AB (ref 65–99)
POTASSIUM: 4.5 mmol/L (ref 3.5–5.1)
SODIUM: 139 mmol/L (ref 135–145)

## 2017-01-18 LAB — PROTIME-INR
INR: 0.98
Prothrombin Time: 13 seconds (ref 11.4–15.2)

## 2017-01-18 LAB — GLUCOSE, CAPILLARY: Glucose-Capillary: 139 mg/dL — ABNORMAL HIGH (ref 65–99)

## 2017-01-18 SURGERY — RADIOLOGY WITH ANESTHESIA
Anesthesia: General

## 2017-01-18 MED ORDER — NITROGLYCERIN 1 MG/10 ML FOR IR/CATH LAB
INTRA_ARTERIAL | Status: AC
  Filled 2017-01-18: qty 10

## 2017-01-18 MED ORDER — MIDAZOLAM HCL 2 MG/2ML IJ SOLN
INTRAMUSCULAR | Status: AC
  Filled 2017-01-18: qty 2

## 2017-01-18 MED ORDER — LIDOCAINE HCL 1 % IJ SOLN
INTRAMUSCULAR | Status: AC
  Filled 2017-01-18: qty 20

## 2017-01-18 MED ORDER — FENTANYL CITRATE (PF) 100 MCG/2ML IJ SOLN
INTRAMUSCULAR | Status: AC
  Filled 2017-01-18: qty 2

## 2017-01-18 MED ORDER — FENTANYL CITRATE (PF) 100 MCG/2ML IJ SOLN
INTRAMUSCULAR | Status: AC | PRN
  Administered 2017-01-18 (×3): 50 ug via INTRAVENOUS
  Administered 2017-01-18 (×2): 25 ug via INTRAVENOUS

## 2017-01-18 MED ORDER — LIDOCAINE HCL 1 % IJ SOLN
INTRAMUSCULAR | Status: AC | PRN
  Administered 2017-01-18: 3 mL

## 2017-01-18 MED ORDER — CEFAZOLIN SODIUM-DEXTROSE 2-4 GM/100ML-% IV SOLN
INTRAVENOUS | Status: AC
  Filled 2017-01-18: qty 100

## 2017-01-18 MED ORDER — MIDAZOLAM HCL 2 MG/2ML IJ SOLN
INTRAMUSCULAR | Status: AC | PRN
  Administered 2017-01-18 (×4): 1 mg via INTRAVENOUS

## 2017-01-18 MED ORDER — SODIUM CHLORIDE 0.9 % IV SOLN: INTRAVENOUS | Status: DC

## 2017-01-18 MED ORDER — NITROGLYCERIN 1 MG/10 ML FOR IR/CATH LAB
INTRA_ARTERIAL | Status: AC | PRN
  Administered 2017-01-18: 100 ug via INTRA_ARTERIAL

## 2017-01-18 MED ORDER — CEFAZOLIN SODIUM-DEXTROSE 2-4 GM/100ML-% IV SOLN
2.0000 g | INTRAVENOUS | Status: AC
  Administered 2017-01-18: 2 g via INTRAVENOUS

## 2017-01-18 MED ORDER — IOPAMIDOL (ISOVUE-300) INJECTION 61%
INTRAVENOUS | Status: AC
  Administered 2017-01-18: 100 mL
  Filled 2017-01-18: qty 150

## 2017-01-18 MED ORDER — IOPAMIDOL (ISOVUE-300) INJECTION 61%
INTRAVENOUS | Status: AC
  Administered 2017-01-18: 50 mL
  Filled 2017-01-18: qty 100

## 2017-01-18 NOTE — Sedation Documentation (Signed)
Patient denies pain and is resting comfortably.  

## 2017-01-18 NOTE — Sedation Documentation (Signed)
Pt urinate using urinal 100cc

## 2017-01-18 NOTE — Sedation Documentation (Signed)
Patient denies pain and is resting comfortably. Pain has subsided

## 2017-01-18 NOTE — Discharge Instructions (Signed)
Angiogram, Care After °This sheet gives you information about how to care for yourself after your procedure. Your health care provider may also give you more specific instructions. If you have problems or questions, contact your health care provider. °What can I expect after the procedure? °After the procedure, it is common to have bruising and tenderness at the catheter insertion area. °Follow these instructions at home: °Insertion site care  °· Follow instructions from your health care provider about how to take care of your insertion site. Make sure you: °¨ Wash your hands with soap and water before you change your bandage (dressing). If soap and water are not available, use hand sanitizer. °¨ Change your dressing as told by your health care provider. °¨ Leave stitches (sutures), skin glue, or adhesive strips in place. These skin closures may need to stay in place for 2 weeks or longer. If adhesive strip edges start to loosen and curl up, you may trim the loose edges. Do not remove adhesive strips completely unless your health care provider tells you to do that. °· Do not take baths, swim, or use a hot tub until your health care provider approves. °· You may shower 24-48 hours after the procedure or as told by your health care provider. °¨ Gently wash the site with plain soap and water. °¨ Pat the area dry with a clean towel. °¨ Do not rub the site. This may cause bleeding. °· Do not apply powder or lotion to the site. Keep the site clean and dry. °· Check your insertion site every day for signs of infection. Check for: °¨ Redness, swelling, or pain. °¨ Fluid or blood. °¨ Warmth. °¨ Pus or a bad smell. °Activity  °· Rest as told by your health care provider, usually for 1-2 days. °· Do not lift anything that is heavier than 10 lbs. (4.5 kg) or as told by your health care provider. °· Do not drive for 24 hours if you were given a medicine to help you relax (sedative). °· Do not drive or use heavy machinery while  taking prescription pain medicine. °General instructions  °· Return to your normal activities as told by your health care provider, usually in about a week. Ask your health care provider what activities are safe for you. °· If the catheter site starts bleeding, lie flat and put pressure on the site. If the bleeding does not stop, get help right away. This is a medical emergency. °· Drink enough fluid to keep your urine clear or pale yellow. This helps flush the contrast dye from your body. °· Take over-the-counter and prescription medicines only as told by your health care provider. °· Keep all follow-up visits as told by your health care provider. This is important. °Contact a health care provider if: °· You have a fever or chills. °· You have redness, swelling, or pain around your insertion site. °· You have fluid or blood coming from your insertion site. °· The insertion site feels warm to the touch. °· You have pus or a bad smell coming from your insertion site. °· You have bruising around the insertion site. °· You notice blood collecting in the tissue around the catheter site (hematoma). The hematoma may be painful to the touch. °Get help right away if: °· You have severe pain at the catheter insertion area. °· The catheter insertion area swells very fast. °· The catheter insertion area is bleeding, and the bleeding does not stop when you hold steady pressure on   the area. °· The area near or just beyond the catheter insertion site becomes pale, cool, tingly, or numb. °These symptoms may represent a serious problem that is an emergency. Do not wait to see if the symptoms will go away. Get medical help right away. Call your local emergency services (911 in the [place] Do not drive yourself to the hospital. °Summary °· After the procedure, it is common to have bruising and tenderness at the catheter insertion area. °· After the procedure, it is important to rest and drink plenty of fluids. °· Do not take baths,  swim, or use a hot tub until your health care provider says it is okay to do so. You may shower 24-48 hours after the procedure or as told by your health care provider. °· If the catheter site starts bleeding, lie flat and put pressure on the site. If the bleeding does not stop, get help right away. This is a medical emergency. °This information is not intended to replace advice given to you by your health care provider. Make sure you discuss any questions you have with your health care provider. °Document Released: 04/08/2005 Document Revised: 08/25/2016 Document Reviewed: 08/25/2016 °[employer] Interactive Patient Education © 2017 [employer] ° °

## 2017-01-18 NOTE — Consult Note (Signed)
Chief Complaint: Patient was seen in consultation today for Type 2 endoleak reapair  Referring Physician(s): Dr. Servando Snare  Supervising Physician: Marybelle Killings  Patient Status: [facility] - Out-pt  History of Present Illness: [name] is a 81 y.o. male with past medical history of renal insufficiency, DM2, and CAD s/p CABG who underwent aortobiiliac stent graft for aortic aneurysm exclusion. 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 He was initially treated with coil embolization of the IMA, and follow-up imaging demonstrates a persistent type II endoleak with a stable maximal sac diameter of 5.6 cm. He is asymptomatic.  IR consulted for Type 2 endoleak repair at the request of Dr. Servando Snare.   Patient has been NPO.  He stopped his Plavix 4/11. He has been in his usual state of health.   Past Medical History:  Diagnosis Date  . AAA (abdominal aortic aneurysm) ([place]) 08/30/12   Doppler 08/30/12 had a small amount of growth. It was a 4.2 x 4.3 cm greater in size than the previous one noted at 4 x 3.8.  05-21-15 followed by CT abd-Dr. Kellie Simmering.  . Adenomatous colon polyp   . Ankle edema    Chronic  . Aortic stenosis    mild AS 07/2016 echo  . Asthma    as a child  . CAD, multiple vessel 1985-2010   Most recent cath 01/01/09: 100% Occluded LAD after SP1, patent LIMA-distal LAD with apical 90% lesion.  Cx-OM1 widely patent stent into proximal bifurcating OM1 . Follow-on Cx => 70-80% -- non-amenable PCI. Widely patent 3 overlapping stents in midRCA with only 40% RPL stenosis; SVG- OM known to be occluded.  . Choledocholithiasis   . Cholelithiasis - with cholangitis    status post ERCP with removal of calculi and biliary stent placement.  . Chronic anemia    On iron supplement; history of positive guaiac - negative colonoscopy in 1996.; Thought to be related to hemorrhoids; status post hemorrhoidectomy  . Chronic back pain     multiple surgeries;  C-spine and lumbar  . Diabetes mellitus    on oral medication  . Diverticulitis of colon 1996  . Diverticulosis   . Dyslipidemia, goal LDL below 70   . Dyspnea   . Erectile dysfunction   . Exertional dyspnea    Chronic baseline SOB with ambulation  . GERD (gastroesophageal reflux disease)   . H/O unstable angina 06/2003, 12/2006, 05/2014   a) '04: Staged Taxus DES PCI to RCA and Cx-OM;;'08 - PCI to proximal ISR in RCA - Cypher DES; c) 05/2014: ISR in RCA & OM1, PCI with Promus P DES: RCA  2.75 x 12, OM1 3.0 mm x 8)  . H/O: pneumonia February 14  . Hemorrhoids   . Hiatal hernia   . History of nuclear stress test December 2013   LOW at Risk. Moderate region of mid to basal inferolateral scar without ischemia -- consistent with distal circumflex disease. Mild apical hypokinesis with an EF of 47%.  Marland Kitchen History of: ST elevation myocardial infarction (STEMI) involving left circumflex coronary artery with complication 2500   PTCA-circumflex; PCI in 1991  . Hypertension   . Irregular heart beat    PVCs and PACs, no arrhythmia recorded  . Moderate aortic stenosis by prior echocardiogram October 2016   Normal LV function - EF 55-60%.. Abnormal relaxation. Mild-moderate aortic stenosis (peak/mean gradient 18/10 mmH)  . Osteoarthritis of both knees    And back; multiple back surgeries,  right knee arthroplasty and left knee arthroscopic surgery x2  . Pneumonia 2016  . S/P CABG x 2 1997   LIMA-LAD, SVG-OM  . Sleep apnea    pt. states he was told to return for f/u, to be fitted for Cpap, but pt. reports that he didn't follow up-no cpap used    Past Surgical History:  Procedure Laterality Date  . Abdominal and Lower Extremity Arterial Ultrasound  08/23/2012; 10/10/2013   Normal ABIs. Nonocclusive lower extremity disease. 4.2 cm x 4.3 cm infrarenal AAA;; 4.4 cm x 4.3 cm (essentially stable)   . ABDOMINAL AORTIC ENDOVASCULAR STENT GRAFT N/A 08/13/2015   Procedure: ABDOMINAL AORTIC ENDOVASCULAR STENT  GRAFT;  Surgeon: Mal Misty, MD;  Location: [place];  Service: Vascular;  Laterality: N/A;  . Anterior cervical plating  04/23/10   At C4-5 and a C6-7 utilizing two separate Biomet MaxAn plates.  . ANTERIOR LAT LUMBAR FUSION Left 11/22/2012   Procedure: ANTERIOR LATERAL LUMBAR FUSION 1 LEVEL;  Surgeon: Eustace Moore, MD;  Location: [place] [facility];  Service: Neurosurgery;  Laterality: Left;  Anterior Lateral Lumbar Fusion Lumbar Three-Four  . BACK SURGERY  1979 & x 10   pt. remarks, "I have had about 10 back surgeries"  . BILIARY STENT PLACEMENT N/A 07/03/2014   Procedure: BILIARY STENT PLACEMENT;  Surgeon: Inda Castle, MD;  Location: [place];  Service: Endoscopy;  Laterality: N/A;  . CARDIAC CATHETERIZATION  September 2004   None Occluded vein graft to OM; diffuse RCA disease in the mid vessel, 80% circumflex-OM stenosis; follow on AV groove circumflex with sequential 90% stenoses and intervening saccular dilation   . CARDIAC CATHETERIZATION  March 2010   4 abnormal Myoview showing apical thinning (possibly due to apical LAD 95%) : 100% Occluded LAD after SV1, distal LAD grafted via LIMA -apical 95% . Cx -OM1 w/patent stent extending into OM 1 . Follow on Cx - 70-80% - non-amenable PCI. RCA widely patent 3 overlapping stents in mRCA w/less than 40% stenosisin RPL; SVG-OM known occluded   . CATARACT EXTRACTION    . Cervical arthrodesis  04/23/10   Anterior cervical arthrodesis, C4-5, C6-7 utilizing 7-mm PEEK interbody cage packed with local autograft & Antifuse putty at C4-5 & an 8-mm cage at C6-7.  Marland Kitchen CERVICAL DISCECTOMY  04/23/10   Decompressive anterior carvical diskectomy. C4-5, C6-7  . CHOLECYSTECTOMY N/A 05/23/2015   Procedure: LAPAROSCOPIC CHOLECYSTECTOMY WITH INTRAOPERATIVE CHOLANGIOGRAM;  Surgeon: Alphonsa Overall, MD;  Location: [facility];  Service: General;  Laterality: N/A;  . [place]  . CORONARY ANGIOPLASTY  1985,1991,15   1985 lateral STEMI Circumflex PTCA  . CORONARY ARTERY  BYPASS GRAFT  1990   LIMA-LAD, SVG-OM  . ERCP N/A 07/03/2014   Procedure: ENDOSCOPIC RETROGRADE CHOLANGIOPANCREATOGRAPHY (ERCP);  Surgeon: Inda Castle, MD;  Location: [place];  Service: Endoscopy;  Laterality: N/A;  . ERCP N/A 07/05/2014   Procedure: ENDOSCOPIC RETROGRADE CHOLANGIOPANCREATOGRAPHY (ERCP);  Surgeon: Inda Castle, MD;  Location: [place];  Service: Endoscopy;  Laterality: N/A;  . ERCP N/A 06/30/2015   Procedure: ENDOSCOPIC RETROGRADE CHOLANGIOPANCREATOGRAPHY (ERCP);  Surgeon: Ladene Artist, MD;  Location: [facility];  Service: Endoscopy;  Laterality: N/A;  . EYE SURGERY    . KNEE ARTHROSCOPY Left    x 2  . LEFT HEART CATHETERIZATION WITH CORONARY ANGIOGRAM N/A 05/15/2014   Procedure: LEFT HEART CATHETERIZATION WITH CORONARY ANGIOGRAM;  Surgeon: Sinclair Grooms, MD;  Location: [facility] LAB;  Service: Cardiovascular;  Laterality: N/A;  .  LEFT HEART CATHETERIZATION WITH CORONARY/GRAFT ANGIOGRAM N/A 06/28/2014   Procedure: LEFT HEART CATHETERIZATION WITH Beatrix Fetters;  Surgeon: Troy Sine, MD;  Location: [facility] CATH LAB;  Service: Cardiovascular;  Laterality: N/A;  . LUMBAR PERCUTANEOUS PEDICLE SCREW 1 LEVEL N/A 11/22/2012   Procedure: LUMBAR PERCUTANEOUS PEDICLE SCREW 1 LEVEL;  Surgeon: Eustace Moore, MD;  Location: [place] [facility]UR[facility]S;  Service: Neurosurgery;  Laterality: N/A;  Lumbar Three-Four Percutaneous Pedicle Screw, Lateral approach  . MINOR HEMORRHOIDECTOMY    . NM MYOVIEW LTD  December 2013   LOW RISK. Mmoderate region of mid to basal inferolateral scar without ischemia. Mild apical hypokinesis with an EF of 47%.  Marland Kitchen PERCUTANEOUS CORONARY STENT INTERVENTION (PCI-S)  September 2004   PCI - RCA 2 overlapping Taxus DES 2.75 mm x 32 mm and 2.75 mm x 12 mm (3.0 mm); PCI-Cx-OM1 - Taxus DES 3.0 mm x 20 mm (3.1 mm);   Marland Kitchen PERCUTANEOUS CORONARY STENT INTERVENTION (PCI-S)  March 2008   80% ISR in proximal Taxus stent in RCA -- covered proximally with Cypher DES 3.0  mm x 12 mm  . PERCUTANEOUS CORONARY STENT INTERVENTION (PCI-S) N/A 05/17/2014   Procedure: PERCUTANEOUS CORONARY STENT INTERVENTION (PCI-S);  Surgeon: Sinclair Grooms, MD;  Location: [facility] CATH LAB;  Service: Cardiovascular;  Laterality: N/A;  . PERIPHERAL VASCULAR CATHETERIZATION  11/03/2016   Procedure: Embolization;  Surgeon: Waynetta Sandy, MD;  Location: [place] CV LAB;  Service: Cardiovascular;;  . POSTERIOR CERVICAL FUSION/FORAMINOTOMY N/A 05/02/2013   Procedure: POSTERIOR CERVICAL FUSION/FORAMINOTOMY CERVICAL SEVEN THORACIC-ONE;  Surgeon: Eustace Moore, MD;  Location: [place] NEURO ORS;  Service: Neurosurgery;  Laterality: N/A;  POSTERIOR CERVICAL FUSION/FORAMINOTOMY CERVICAL SEVEN THORACIC-ONE  . SPHINCTEROTOMY N/A 06/30/2015   Procedure: SPHINCTEROTOMY;  Surgeon: Ladene Artist, MD;  Location: [facility];  Service: Endoscopy;  Laterality: N/A;  . TOTAL KNEE ARTHROPLASTY Right   . TOTAL KNEE ARTHROPLASTY Left 12/20/2016   Procedure: LEFT TOTAL KNEE ARTHROPLASTY;  Surgeon: Vickey Huger, MD;  Location: [place];  Service: Orthopedics;  Laterality: Left;  . TRANSTHORACIC ECHOCARDIOGRAM  December 2013; October 2016   a. EF 55-60%. mod LA dilation. Aortic Sclerosis;; b. EF 55-60%, Gr 1 DD, Mild-Mod AS (Peark - Mean Gradient 18-10 mmHg)  . TRANSTHORACIC ECHOCARDIOGRAM  07/2016   EF 45-50%. Inferolateral hypokinesis (correlates with scar noted on Myoview). GR 1 DD. Mild aortic stenosis. Mod LAE.  Marland Kitchen VISCERAL ANGIOGRAM  11/03/2016   Procedure: Visceral Angiogram;  Surgeon: Waynetta Sandy, MD;  Location: [place] CV LAB;  Service: Cardiovascular;;    Allergies: Oxycodone; Lisinopril; Statins; and Welchol [colesevelam hcl]  Medications: Prior to Admission medications   Medication Sig Start Date End Date Taking? Authorizing Provider  acetaminophen (TYLENOL) 500 MG tablet Take 1,000 mg by mouth every 6 (six) hours as needed for mild pain, moderate pain or headache. Reported on  03/16/2016    Historical Provider, MD  albuterol (PROVENTIL HFA;VENTOLIN HFA) 108 (90 Base) MCG/ACT inhaler Inhale 2 puffs into the lungs every 6 (six) hours as needed for wheezing or shortness of breath. 01/12/17   Denita Lung, MD  amLODipine (NORVASC) 5 MG tablet TAKE 5 MG BY MOUTH DAILY....need appointment before further refills Patient taking differently: Take 5 mg by mouth daily.  09/07/16   Leonie Man, MD  aspirin EC 325 MG EC tablet Take 1 tablet (325 mg total) by mouth daily with breakfast. Patient not taking: Reported on 01/04/2017 12/21/16   Donia Ast, PA  Choline  Fenofibrate (FENOFIBRIC ACID) 135 MG CPDR TAKE ONE CAPSULE BY MOUTH ONCE DAILY Patient taking differently: TAKE ONE CAPSULE BY MOUTH ONCE DAILY IN THE EVENING 02/13/16   Leonie Man, MD  clopidogrel (PLAVIX) 75 MG tablet TAKE 75 MG BY MOUTH ONCE DAILY 02/09/16   Leonie Man, MD  ferrous sulfate 325 (65 FE) MG tablet Take 325 mg by mouth daily with breakfast.    Historical Provider, MD  glucose blood test strip Pt uses one touch verio test strips and tests twice daily. Patient taking differently: 1 each by Other route every other day.  12/18/15   Denita Lung, MD  HYDROcodone-acetaminophen (NORCO/VICODIN) 5-325 MG tablet Take 1 tablet by mouth every 6 (six) hours as needed for moderate pain.    Historical Provider, MD  losartan (COZAAR) 100 MG tablet Take 1 tablet (100 mg total) by mouth daily. 02/02/16   Leonie Man, MD  methocarbamol (ROBAXIN) 500 MG tablet Take 1-2 tablets (500-1,000 mg total) by mouth every 6 (six) hours as needed for muscle spasms. 12/21/16   Donia Ast, PA  Multiple Vitamins-Minerals (MULTIVITAMIN WITH MINERALS) tablet Take 1 tablet by mouth daily.     Historical Provider, MD  oxyCODONE 10 MG TABS Take 1 tablet (10 mg total) by mouth every 3 (three) hours as needed for breakthrough pain. Patient not taking: Reported on 01/04/2017 12/21/16   Donia Ast, PA  pantoprazole  (PROTONIX) 40 MG tablet TAKE ONE TABLET BY MOUTH ONCE DAILY 02/20/16   Leonie Man, MD  pioglitazone-metformin (ACTOPLUS MET) 15-500 MG tablet TAKE ONE TABLET BY MOUTH ONCE DAILY 11/17/15   Denita Lung, MD  promethazine-dextromethorphan (PROMETHAZINE-DM) 6.25-15 MG/5ML syrup Take 5 mLs by mouth 4 (four) times daily as needed for cough.    Historical Provider, MD  sildenafil (REVATIO) 20 MG tablet TAKE 2-5 TABLETS BY MOUTH DAILY AS NEEDED Patient taking differently: TAKE 2-5 TABLETS BY MOUTH DAILY AS NEEDED FOR ERECTILE DYSFUNCTION 02/19/16   Denita Lung, MD     Family History  Problem Relation Age of Onset  . Arthritis Mother   . Diabetes Father   . Heart disease Father   . Stroke Sister   . Hypertension Sister   . Heart disease Sister   . Diabetes Sister   . Breast cancer Sister   . Heart disease Brother   . Ulcers Brother   . Colon cancer Neg Hx     Social History   Social History  . Marital status: Married    Spouse name: N/A  . Number of children: N/A  . Years of education: N/A   Social History Main Topics  . Smoking status: Former Smoker    Types: Cigarettes    Quit date: 10/05/1983  . Smokeless tobacco: Never Used  . Alcohol use 0.6 oz/week    1 Glasses of wine per week     Comment:  4 oz. wine daily  . Drug use: No  . Sexual activity: Not Currently   Other Topics Concern  . Not on file   Social History Narrative   He is married, father of two, grandfather to 80, great grandfather to two.    Not really getting much exercise now, do to his significant back and hip pain.    He does not smoke and only has an alcoholic beverage.      Review of Systems  Constitutional: Negative for fatigue and fever.  Respiratory: Negative for cough and shortness of breath.  Cardiovascular: Negative for chest pain.  Gastrointestinal: Negative for abdominal pain.  Psychiatric/Behavioral: Negative for behavioral problems and confusion.    Vital Signs: BP (!) 143/68    Pulse 64   Temp 97.8 F (36.6 C) (Oral)   Resp 16   Ht '5\' 9"'  (1.753 m)   Wt 210 lb (95.3 kg)   SpO2 99%   BMI 31.01 kg/m   Physical Exam  Constitutional: He is oriented to person, place, and time. He appears well-developed.  Cardiovascular: Normal rate, regular rhythm and normal heart sounds.   Pulmonary/Chest: Effort normal and breath sounds normal. No respiratory distress.  Abdominal: Soft.  Neurological: He is alert and oriented to person, place, and time.  Skin: Skin is warm and dry.  Psychiatric: He has a normal mood and affect. His behavior is normal. Judgment and thought content normal.  Nursing note and vitals reviewed.   Mallampati Score:  MD Evaluation Airway: WNL Heart: WNL Abdomen: WNL Chest/ Lungs: WNL ASA  Classification: 3 Mallampati/Airway Score: Two  Imaging: No results found.  Labs:  CBC:  Recent Labs  12/09/16 1047 12/21/16 0500 01/14/17 1501 01/18/17 0715  WBC 3.6* 6.6 5.7 6.0  HGB 12.1* 9.8* 10.6* 10.8*  HCT 36.7* 30.1* 32.6* 33.7*  PLT 200 126* 216 188    COAGS:  Recent Labs  01/14/17 1501 01/18/17 0715  INR 1.10 0.98    BMP:  Recent Labs  11/03/16 1009 12/09/16 1047 12/21/16 0500 01/14/17 1501  NA 140 139 136 137  K 4.0 4.3 4.7 4.0  CL 108 109 104 108  CO2  --  '24 24 22  ' GLUCOSE 101* 94 127* 110*  BUN 27* 20 21* 20  CALCIUM  --  9.3 8.7* 9.2  CREATININE 1.50* 1.46* 1.55* 1.36*  GFRNONAA  --  44* 41* 48*  GFRAA  --  51* 48* 56*    LIVER FUNCTION TESTS:  Recent Labs  03/02/16 1215 06/10/16 0904 11/09/16 0922 12/09/16 1047  BILITOT 0.7 0.6 0.7 0.7  AST '30 20 23 26  ' ALT '22 15 19 19  ' ALKPHOS 30* 32* 28* 27*  PROT 6.4 6.3 6.4 6.7  ALBUMIN 4.1 3.9 4.0 3.6    TUMOR MARKERS: No results for input(s): AFPTM, CEA, CA199, CHROMGRNA in the last 8760 hours.  Assessment and Plan: Patient with past medical history of renal insufficiency, DM2, and CAD s/p CABG presents with Type 2 endoleak that persists after  embolization of the IMA.  CT Angio Abd/Pelvis 3/16 shows likely culprit is a persistent lumbar artery.  IR consulted for endoleak repair.   Patient met with Dr. Barbie Banner in [place] [facility] 01/04/17 to discuss options.  Patient elects for repair and presents for procedure today. He has been NPO.  He stopped his Plavix on 01/12/17.  He has been in his usual state of health.  Risks and benefits discussed with the patient including, but not limited to bleeding, infection, vascular injury or contrast induced renal failure. All of the patient's questions were answered, patient is agreeable to proceed. Consent signed and in chart.  Thank you for this interesting consult.  I greatly enjoyed meeting WEN MERCED and look forward to participating in their care.  A copy of this report was sent to the requesting provider on this date.  Electronically Signed: Docia Barrier 01/18/2017, 7:47 AM   I spent a total of  30 Minutes   in face to face in clinical consultation, greater than 50% of which was counseling/coordinating care for  Type 2 endoleak

## 2017-01-18 NOTE — Procedures (Signed)
Angio and R lumbar 4 artery embolization Access R groin EBL 0 Comp 0

## 2017-01-18 NOTE — Sedation Documentation (Signed)
Patient is resting comfortably. C/o mild hip pain. Medication given per Dr. Barbie Banner and hip to be adjusted.

## 2017-01-18 NOTE — Sedation Documentation (Signed)
Patient denies pain and is resting comfortably. Asleep

## 2017-01-18 NOTE — Sedation Documentation (Signed)
Patient denies pain and is resting comfortably. Pain has subsided.

## 2017-01-18 NOTE — Sedation Documentation (Signed)
Pt c/o Hip pain again; medication given per Dr. Barbie Banner

## 2017-01-25 DIAGNOSIS — M1712 Unilateral primary osteoarthritis, left knee: Secondary | ICD-10-CM | POA: Diagnosis not present

## 2017-01-25 DIAGNOSIS — M25562 Pain in left knee: Secondary | ICD-10-CM | POA: Diagnosis not present

## 2017-01-26 ENCOUNTER — Other Ambulatory Visit (HOSPITAL_COMMUNITY): Payer: Self-pay | Admitting: Interventional Radiology

## 2017-01-26 DIAGNOSIS — T82330D Leakage of aortic (bifurcation) graft (replacement), subsequent encounter: Secondary | ICD-10-CM

## 2017-01-26 DIAGNOSIS — IMO0001 Reserved for inherently not codable concepts without codable children: Secondary | ICD-10-CM

## 2017-01-27 DIAGNOSIS — M25562 Pain in left knee: Secondary | ICD-10-CM | POA: Diagnosis not present

## 2017-01-27 DIAGNOSIS — M1712 Unilateral primary osteoarthritis, left knee: Secondary | ICD-10-CM | POA: Diagnosis not present

## 2017-02-01 DIAGNOSIS — M1712 Unilateral primary osteoarthritis, left knee: Secondary | ICD-10-CM | POA: Diagnosis not present

## 2017-02-01 DIAGNOSIS — M25562 Pain in left knee: Secondary | ICD-10-CM | POA: Diagnosis not present

## 2017-02-04 DIAGNOSIS — M1712 Unilateral primary osteoarthritis, left knee: Secondary | ICD-10-CM | POA: Diagnosis not present

## 2017-02-04 DIAGNOSIS — M25562 Pain in left knee: Secondary | ICD-10-CM | POA: Diagnosis not present

## 2017-02-08 DIAGNOSIS — M25562 Pain in left knee: Secondary | ICD-10-CM | POA: Diagnosis not present

## 2017-02-08 DIAGNOSIS — M1712 Unilateral primary osteoarthritis, left knee: Secondary | ICD-10-CM | POA: Diagnosis not present

## 2017-02-10 DIAGNOSIS — M25562 Pain in left knee: Secondary | ICD-10-CM | POA: Diagnosis not present

## 2017-02-10 DIAGNOSIS — M1712 Unilateral primary osteoarthritis, left knee: Secondary | ICD-10-CM | POA: Diagnosis not present

## 2017-02-11 ENCOUNTER — Other Ambulatory Visit: Payer: Self-pay | Admitting: Cardiology

## 2017-02-15 DIAGNOSIS — M1712 Unilateral primary osteoarthritis, left knee: Secondary | ICD-10-CM | POA: Diagnosis not present

## 2017-02-15 DIAGNOSIS — M25562 Pain in left knee: Secondary | ICD-10-CM | POA: Diagnosis not present

## 2017-02-17 DIAGNOSIS — M1712 Unilateral primary osteoarthritis, left knee: Secondary | ICD-10-CM | POA: Diagnosis not present

## 2017-02-17 DIAGNOSIS — M25562 Pain in left knee: Secondary | ICD-10-CM | POA: Diagnosis not present

## 2017-02-22 DIAGNOSIS — M25562 Pain in left knee: Secondary | ICD-10-CM | POA: Diagnosis not present

## 2017-02-22 DIAGNOSIS — H35033 Hypertensive retinopathy, bilateral: Secondary | ICD-10-CM | POA: Diagnosis not present

## 2017-02-22 DIAGNOSIS — E119 Type 2 diabetes mellitus without complications: Secondary | ICD-10-CM | POA: Diagnosis not present

## 2017-02-22 DIAGNOSIS — M1712 Unilateral primary osteoarthritis, left knee: Secondary | ICD-10-CM | POA: Diagnosis not present

## 2017-02-22 DIAGNOSIS — H353130 Nonexudative age-related macular degeneration, bilateral, stage unspecified: Secondary | ICD-10-CM | POA: Diagnosis not present

## 2017-02-22 DIAGNOSIS — H40013 Open angle with borderline findings, low risk, bilateral: Secondary | ICD-10-CM | POA: Diagnosis not present

## 2017-02-22 LAB — HM DIABETES EYE EXAM

## 2017-02-23 ENCOUNTER — Encounter: Payer: Self-pay | Admitting: Family Medicine

## 2017-02-24 ENCOUNTER — Other Ambulatory Visit: Payer: Self-pay | Admitting: Family Medicine

## 2017-02-24 DIAGNOSIS — M25562 Pain in left knee: Secondary | ICD-10-CM | POA: Diagnosis not present

## 2017-02-24 DIAGNOSIS — M1712 Unilateral primary osteoarthritis, left knee: Secondary | ICD-10-CM | POA: Diagnosis not present

## 2017-03-01 ENCOUNTER — Encounter: Payer: Self-pay | Admitting: Family Medicine

## 2017-03-01 ENCOUNTER — Ambulatory Visit (INDEPENDENT_AMBULATORY_CARE_PROVIDER_SITE_OTHER): Payer: Medicare Other | Admitting: Family Medicine

## 2017-03-01 VITALS — BP 120/70 | HR 84 | Ht 70.0 in | Wt 215.0 lb

## 2017-03-01 DIAGNOSIS — T82330D Leakage of aortic (bifurcation) graft (replacement), subsequent encounter: Secondary | ICD-10-CM | POA: Diagnosis not present

## 2017-03-01 DIAGNOSIS — I110 Hypertensive heart disease with heart failure: Secondary | ICD-10-CM

## 2017-03-01 DIAGNOSIS — M25562 Pain in left knee: Secondary | ICD-10-CM | POA: Diagnosis not present

## 2017-03-01 DIAGNOSIS — N183 Chronic kidney disease, stage 3 unspecified: Secondary | ICD-10-CM

## 2017-03-01 DIAGNOSIS — E669 Obesity, unspecified: Secondary | ICD-10-CM | POA: Diagnosis not present

## 2017-03-01 DIAGNOSIS — Z789 Other specified health status: Secondary | ICD-10-CM

## 2017-03-01 DIAGNOSIS — E118 Type 2 diabetes mellitus with unspecified complications: Secondary | ICD-10-CM | POA: Diagnosis not present

## 2017-03-01 DIAGNOSIS — T82855D Stenosis of coronary artery stent, subsequent encounter: Secondary | ICD-10-CM | POA: Diagnosis not present

## 2017-03-01 DIAGNOSIS — K219 Gastro-esophageal reflux disease without esophagitis: Secondary | ICD-10-CM | POA: Diagnosis not present

## 2017-03-01 DIAGNOSIS — Z9861 Coronary angioplasty status: Secondary | ICD-10-CM | POA: Diagnosis not present

## 2017-03-01 DIAGNOSIS — E785 Hyperlipidemia, unspecified: Secondary | ICD-10-CM

## 2017-03-01 DIAGNOSIS — Z79899 Other long term (current) drug therapy: Secondary | ICD-10-CM | POA: Diagnosis not present

## 2017-03-01 DIAGNOSIS — I5032 Chronic diastolic (congestive) heart failure: Secondary | ICD-10-CM | POA: Diagnosis not present

## 2017-03-01 DIAGNOSIS — N529 Male erectile dysfunction, unspecified: Secondary | ICD-10-CM

## 2017-03-01 DIAGNOSIS — I2510 Atherosclerotic heart disease of native coronary artery without angina pectoris: Secondary | ICD-10-CM | POA: Diagnosis not present

## 2017-03-01 DIAGNOSIS — IMO0001 Reserved for inherently not codable concepts without codable children: Secondary | ICD-10-CM

## 2017-03-01 DIAGNOSIS — Z96653 Presence of artificial knee joint, bilateral: Secondary | ICD-10-CM | POA: Diagnosis not present

## 2017-03-01 DIAGNOSIS — M1712 Unilateral primary osteoarthritis, left knee: Secondary | ICD-10-CM | POA: Diagnosis not present

## 2017-03-01 DIAGNOSIS — Z23 Encounter for immunization: Secondary | ICD-10-CM

## 2017-03-01 NOTE — Patient Instructions (Addendum)
  [name] , Thank you for taking time to come for your Medicare Wellness Visit. I appreciate your ongoing commitment to your health goals. Please review the following plan we discussed and let me know if I can assist you in the future.   These are the goals we discussed: Goals    None      This is a list of the screening recommended for you and due dates:  Health Maintenance  Topic Date Due  . Complete foot exam   08/17/2014  . Tetanus Vaccine  01/18/2017  . Flu Shot  05/04/2017  . Hemoglobin A1C  06/11/2017  . Colon Cancer Screening  09/21/2017  . Eye exam for diabetics  02/22/2018  . Pneumonia vaccines  Completed

## 2017-03-01 NOTE — Progress Notes (Addendum)
Subjective:   HPI  [name] is a 81 y.o. male who presents for Chief Complaint  Patient presents with  . Medicare Wellness    med check plus    Medical care team includes: Denita Lung, MD here for primary care  Dr.Cain Dr.Harding Dr Ronnie Derby  Dr [facility] Preventative care:  Last ophthalmology visit: 02/02/17 Last dental visit: couple weeks ago Last colonoscopy: 09/21/12 Last prostate exam:  Last EKG: 10/27/16 Last labs:4/18  Prior vaccinations:  TD or Tdap: 01/19/07 Influenza:10/19/16 Pneumococcal: pneu 23 :01/19/07 06/04/14 Shingles/Zostavax:Information given on Shingrix   Advanced directive: yes asked for a copy  Concerns: Overall he seems be doing well and has no particular concerns. He has had no chest pain, shortness of breath, PND or DOE. He has had a endovascular leak and has had stenting done. He is still in the process of making sure that that is stable. He does keep in touch with cardiology. Record indicates renal insufficiency. He also has hyperlipidemia. His diabetes seems to be under excellent control. He does have statin intolerance but is not interested in any other therapies for this. His weight is stable. He has not had any symptoms of reflux disease. Medications were reviewed. He seems be stable on all of these including sildenafil. He has had both knees replaced and is doing quite well, having no complaints.  Reviewed their medical, surgical, family, social, medication, and allergy history and updated chart as appropriate.  Past Medical History:  Diagnosis Date  . AAA (abdominal aortic aneurysm) ([place]) 08/30/12   Doppler 08/30/12 had a small amount of growth. It was a 4.2 x 4.3 cm greater in size than the previous one noted at 4 x 3.8.  05-21-15 followed by CT abd-Dr. Kellie Simmering.  . Adenomatous colon polyp   . Ankle edema    Chronic  . Aortic stenosis    mild AS 07/2016 echo  . Asthma    as a child  . CAD, multiple vessel 1985-2010   Most recent cath  01/01/09: 100% Occluded LAD after SP1, patent LIMA-distal LAD with apical 90% lesion.  Cx-OM1 widely patent stent into proximal bifurcating OM1 . Follow-on Cx => 70-80% -- non-amenable PCI. Widely patent 3 overlapping stents in midRCA with only 40% RPL stenosis; SVG- OM known to be occluded.  . Choledocholithiasis   . Cholelithiasis - with cholangitis    status post ERCP with removal of calculi and biliary stent placement.  . Chronic anemia    On iron supplement; history of positive guaiac - negative colonoscopy in 1996.; Thought to be related to hemorrhoids; status post hemorrhoidectomy  . Chronic back pain     multiple surgeries; C-spine and lumbar  . Diabetes mellitus    on oral medication  . Diverticulitis of colon 1996  . Diverticulosis   . Dyslipidemia, goal LDL below 70   . Dyspnea   . Erectile dysfunction   . Exertional dyspnea    Chronic baseline SOB with ambulation  . GERD (gastroesophageal reflux disease)   . H/O unstable angina 06/2003, 12/2006, 05/2014   a) '04: Staged Taxus DES PCI to RCA and Cx-OM;;'08 - PCI to proximal ISR in RCA - Cypher DES; c) 05/2014: ISR in RCA & OM1, PCI with Promus P DES: RCA  2.75 x 12, OM1 3.0 mm x 8)  . H/O: pneumonia February 14  . Hemorrhoids   . Hiatal hernia   . History of nuclear stress test December 2013   LOW at Risk. Moderate region  of mid to basal inferolateral scar without ischemia -- consistent with distal circumflex disease. Mild apical hypokinesis with an EF of 47%.  Marland Kitchen History of: ST elevation myocardial infarction (STEMI) involving left circumflex coronary artery with complication 3875   PTCA-circumflex; PCI in 1991  . Hypertension   . Irregular heart beat    PVCs and PACs, no arrhythmia recorded  . Moderate aortic stenosis by prior echocardiogram October 2016   Normal LV function - EF 55-60%.. Abnormal relaxation. Mild-moderate aortic stenosis (peak/mean gradient 18/10 mmH)  . Osteoarthritis of both knees    And back; multiple back  surgeries, right knee arthroplasty and left knee arthroscopic surgery x2  . Pneumonia 2016  . S/P CABG x 2 1997   LIMA-LAD, SVG-OM  . Sleep apnea    pt. states he was told to return for f/u, to be fitted for Cpap, but pt. reports that he didn't follow up-no cpap used    Past Surgical History:  Procedure Laterality Date  . Abdominal and Lower Extremity Arterial Ultrasound  08/23/2012; 10/10/2013   Normal ABIs. Nonocclusive lower extremity disease. 4.2 cm x 4.3 cm infrarenal AAA;; 4.4 cm x 4.3 cm (essentially stable)   . ABDOMINAL AORTIC ENDOVASCULAR STENT GRAFT N/A 08/13/2015   Procedure: ABDOMINAL AORTIC ENDOVASCULAR STENT GRAFT;  Surgeon: Mal Misty, MD;  Location: [place];  Service: Vascular;  Laterality: N/A;  . Anterior cervical plating  04/23/10   At C4-5 and a C6-7 utilizing two separate Biomet MaxAn plates.  . ANTERIOR LAT LUMBAR FUSION Left 11/22/2012   Procedure: ANTERIOR LATERAL LUMBAR FUSION 1 LEVEL;  Surgeon: Eustace Moore, MD;  Location: [place] [facility];  Service: Neurosurgery;  Laterality: Left;  Anterior Lateral Lumbar Fusion Lumbar Three-Four  . BACK SURGERY  1979 & x 10   pt. remarks, "I have had about 10 back surgeries"  . BILIARY STENT PLACEMENT N/A 07/03/2014   Procedure: BILIARY STENT PLACEMENT;  Surgeon: Inda Castle, MD;  Location: [place];  Service: Endoscopy;  Laterality: N/A;  . CARDIAC CATHETERIZATION  September 2004   None Occluded vein graft to OM; diffuse RCA disease in the mid vessel, 80% circumflex-OM stenosis; follow on AV groove circumflex with sequential 90% stenoses and intervening saccular dilation   . CARDIAC CATHETERIZATION  March 2010   4 abnormal Myoview showing apical thinning (possibly due to apical LAD 95%) : 100% Occluded LAD after SV1, distal LAD grafted via LIMA -apical 95% . Cx -OM1 w/patent stent extending into OM 1 . Follow on Cx - 70-80% - non-amenable PCI. RCA widely patent 3 overlapping stents in mRCA w/less than 40% stenosisin RPL;  SVG-OM known occluded   . CATARACT EXTRACTION    . Cervical arthrodesis  04/23/10   Anterior cervical arthrodesis, C4-5, C6-7 utilizing 7-mm PEEK interbody cage packed with local autograft & Antifuse putty at C4-5 & an 8-mm cage at C6-7.  Marland Kitchen CERVICAL DISCECTOMY  04/23/10   Decompressive anterior carvical diskectomy. C4-5, C6-7  . CHOLECYSTECTOMY N/A 05/23/2015   Procedure: LAPAROSCOPIC CHOLECYSTECTOMY WITH INTRAOPERATIVE CHOLANGIOGRAM;  Surgeon: Alphonsa Overall, MD;  Location: [facility];  Service: General;  Laterality: N/A;  . [place]  . CORONARY ANGIOPLASTY  1985,1991,15   1985 lateral STEMI Circumflex PTCA  . CORONARY ARTERY BYPASS GRAFT  1990   LIMA-LAD, SVG-OM  . ERCP N/A 07/03/2014   Procedure: ENDOSCOPIC RETROGRADE CHOLANGIOPANCREATOGRAPHY (ERCP);  Surgeon: Inda Castle, MD;  Location: [place];  Service: Endoscopy;  Laterality: N/A;  . ERCP N/A 07/05/2014  Procedure: ENDOSCOPIC RETROGRADE CHOLANGIOPANCREATOGRAPHY (ERCP);  Surgeon: Inda Castle, MD;  Location: [place];  Service: Endoscopy;  Laterality: N/A;  . ERCP N/A 06/30/2015   Procedure: ENDOSCOPIC RETROGRADE CHOLANGIOPANCREATOGRAPHY (ERCP);  Surgeon: Ladene Artist, MD;  Location: [facility];  Service: Endoscopy;  Laterality: N/A;  . EYE SURGERY    . IR ANGIOGRAM EXTREMITY LEFT  01/18/2017  . IR ANGIOGRAM PELVIS SELECTIVE OR SUPRASELECTIVE  01/18/2017  . IR ANGIOGRAM SELECTIVE EACH ADDITIONAL VESSEL  01/18/2017  . IR AORTAGRAM ABDOMINAL SERIALOGRAM  01/18/2017  . IR EMBO ARTERIAL NOT HEMORR HEMANG INC GUIDE ROADMAPPING  01/18/2017  . IR US GUIDE VASC ACCESS RIGHT  01/18/2017  . KNEE ARTHROSCOPY Left    x 2  . LEFT HEART CATHETERIZATION WITH CORONARY ANGIOGRAM N/A 05/15/2014   Procedure: LEFT HEART CATHETERIZATION WITH CORONARY ANGIOGRAM;  Surgeon: Sinclair Grooms, MD;  Location: [facility];  Service: Cardiovascular;  Laterality: N/A;  . LEFT HEART CATHETERIZATION WITH CORONARY/GRAFT ANGIOGRAM N/A 06/28/2014    Procedure: LEFT HEART CATHETERIZATION WITH Beatrix Fetters;  Surgeon: Troy Sine, MD;  Location: [facility] CATH LAB;  Service: Cardiovascular;  Laterality: N/A;  . LUMBAR PERCUTANEOUS PEDICLE SCREW 1 LEVEL N/A 11/22/2012   Procedure: LUMBAR PERCUTANEOUS PEDICLE SCREW 1 LEVEL;  Surgeon: Eustace Moore, MD;  Location: [place] [facility];  Service: Neurosurgery;  Laterality: N/A;  Lumbar Three-Four Percutaneous Pedicle Screw, Lateral approach  . MINOR HEMORRHOIDECTOMY    . NM MYOVIEW LTD  December 2013   LOW RISK. Mmoderate region of mid to basal inferolateral scar without ischemia. Mild apical hypokinesis with an EF of 47%.  Marland Kitchen PERCUTANEOUS CORONARY STENT INTERVENTION (PCI-S)  September 2004   PCI - RCA 2 overlapping Taxus DES 2.75 mm x 32 mm and 2.75 mm x 12 mm (3.0 mm); PCI-Cx-OM1 - Taxus DES 3.0 mm x 20 mm (3.1 mm);   Marland Kitchen PERCUTANEOUS CORONARY STENT INTERVENTION (PCI-S)  March 2008   80% ISR in proximal Taxus stent in RCA -- covered proximally with Cypher DES 3.0 mm x 12 mm  . PERCUTANEOUS CORONARY STENT INTERVENTION (PCI-S) N/A 05/17/2014   Procedure: PERCUTANEOUS CORONARY STENT INTERVENTION (PCI-S);  Surgeon: Sinclair Grooms, MD;  Location: [facility] CATH LAB;  Service: Cardiovascular;  Laterality: N/A;  . PERIPHERAL VASCULAR CATHETERIZATION  11/03/2016   Procedure: Embolization;  Surgeon: Waynetta Sandy, MD;  Location: [place] CV LAB;  Service: Cardiovascular;;  . POSTERIOR CERVICAL FUSION/FORAMINOTOMY N/A 05/02/2013   Procedure: POSTERIOR CERVICAL FUSION/FORAMINOTOMY CERVICAL SEVEN THORACIC-ONE;  Surgeon: Eustace Moore, MD;  Location: [place] [facility];  Service: Neurosurgery;  Laterality: N/A;  POSTERIOR CERVICAL FUSION/FORAMINOTOMY CERVICAL SEVEN THORACIC-ONE  . SPHINCTEROTOMY N/A 06/30/2015   Procedure: SPHINCTEROTOMY;  Surgeon: Ladene Artist, MD;  Location: [facility];  Service: Endoscopy;  Laterality: N/A;  . TOTAL KNEE ARTHROPLASTY Right   . TOTAL KNEE ARTHROPLASTY Left 12/20/2016    Procedure: LEFT TOTAL KNEE ARTHROPLASTY;  Surgeon: Vickey Huger, MD;  Location: [place];  Service: Orthopedics;  Laterality: Left;  . TRANSTHORACIC ECHOCARDIOGRAM  December 2013; October 2016   a. EF 55-60%. mod LA dilation. Aortic Sclerosis;; b. EF 55-60%, Gr 1 DD, Mild-Mod AS ([place]k - Mean Gradient 18-10 mmHg)  . TRANSTHORACIC ECHOCARDIOGRAM  07/2016   EF 45-50%. Inferolateral hypokinesis (correlates with scar noted on Myoview). GR 1 DD. Mild aortic stenosis. Mod LAE.  Marland Kitchen VISCERAL ANGIOGRAM  11/03/2016   Procedure: Visceral Angiogram;  Surgeon: Waynetta Sandy, MD;  Location: [place] [facility];  Service:  Cardiovascular;;    Social History   Social History  . Marital status: Married    Spouse name: N/A  . Number of children: N/A  . Years of education: N/A   Occupational History  . Not on file.   Social History Main Topics  . Smoking status: Former Smoker    Types: Cigarettes    Quit date: 10/05/1983  . Smokeless tobacco: Never Used  . Alcohol use 0.6 oz/week    1 Glasses of wine per week     Comment:  4 oz. wine daily  . Drug use: No  . Sexual activity: Not Currently   Other Topics Concern  . Not on file   Social History Narrative   He is married, father of two, grandfather to 38, great grandfather to two.    Not really getting much exercise now, do to his significant back and hip pain.    He does not smoke and only has an alcoholic beverage.     Family History  Problem Relation Age of Onset  . Arthritis Mother   . Diabetes Father   . Heart disease Father   . Stroke Sister   . Hypertension Sister   . Heart disease Sister   . Diabetes Sister   . Breast cancer Sister   . Heart disease Brother   . Ulcers Brother   . Colon cancer Neg Hx      Current Outpatient Prescriptions:  .  acetaminophen (TYLENOL) 500 MG tablet, Take 1,000 mg by mouth every 6 (six) hours as needed for mild pain, moderate pain or headache. Reported on 03/16/2016, Disp: , Rfl:  .   albuterol (PROVENTIL HFA;VENTOLIN HFA) 108 (90 Base) MCG/ACT inhaler, Inhale 2 puffs into the lungs every 6 (six) hours as needed for wheezing or shortness of breath., Disp: 1 Inhaler, Rfl: 0 .  amLODipine (NORVASC) 5 MG tablet, TAKE 5 MG BY MOUTH DAILY....need appointment before further refills (Patient taking differently: Take 5 mg by mouth daily. ), Disp: 90 tablet, Rfl: 0 .  aspirin EC 325 MG EC tablet, Take 1 tablet (325 mg total) by mouth daily with breakfast., Disp: 30 tablet, Rfl: 0 .  Choline Fenofibrate (FENOFIBRIC ACID) 135 MG CPDR, TAKE ONE CAPSULE BY MOUTH ONCE DAILY (Patient taking differently: TAKE ONE CAPSULE BY MOUTH ONCE DAILY IN THE EVENING), Disp: 90 capsule, Rfl: 3 .  clopidogrel (PLAVIX) 75 MG tablet, TAKE ONE TABLET BY MOUTH ONCE DAILY, Disp: 90 tablet, Rfl: 3 .  ferrous sulfate 325 (65 FE) MG tablet, Take 325 mg by mouth daily with breakfast., Disp: , Rfl:  .  glucose blood test strip, Pt uses one touch verio test strips and tests twice daily. (Patient taking differently: 1 each by Other route every other day. ), Disp: 100 each, Rfl: 2 .  losartan (COZAAR) 100 MG tablet, Take 1 tablet (100 mg total) by mouth daily., Disp: 90 tablet, Rfl: 3 .  Multiple Vitamins-Minerals (MULTIVITAMIN WITH MINERALS) tablet, Take 1 tablet by mouth daily. , Disp: , Rfl:  .  pantoprazole (PROTONIX) 40 MG tablet, TAKE ONE TABLET BY MOUTH ONCE DAILY, Disp: 90 tablet, Rfl: 3 .  pioglitazone-metformin (ACTOPLUS MET) 15-500 MG tablet, TAKE ONE TABLET BY MOUTH ONCE DAILY, Disp: 90 tablet, Rfl: 0 .  sildenafil (REVATIO) 20 MG tablet, TAKE 2-5 TABLETS BY MOUTH DAILY AS NEEDED (Patient taking differently: TAKE 2-5 TABLETS BY MOUTH DAILY AS NEEDED FOR ERECTILE DYSFUNCTION), Disp: 50 tablet, Rfl: 5  Allergies  Allergen Reactions  . Oxycodone  Shortness Of Breath and Cough  . Lisinopril Cough  . Statins Other (See Comments)    MYALGIAS   . Welchol [Colesevelam Hcl] Itching       Review of  Systems Negative except as above    Objective:   General appearance: alert, no distress, WD/WN, Caucasian male Skin: Normal HEENT: normocephalic, conjunctiva/corneas normal, sclerae anicteric, PERRLA, EOMi, nares patent, no discharge or erythema, pharynx normal Oral cavity: MMM, tongue normal, teeth normal Neck: supple, no lymphadenopathy, no thyromegaly, no masses, normal ROM, no bruits Chest: non tender, normal shape and expansion Heart: RRR, normal S1, S2,2/6 SEM Lungs: CTA bilaterally, no wheezes, rhonchi, or rales Abdomen: +bs, soft, non tender, non distended, no masses, no hepatomegaly, no splenomegaly, no bruits Musculoskeletal: upper extremities non tender, no obvious deformity, normal ROM throughout, lower extremities non tender, no obvious deformity, normal ROM throughout Extremities: no edema, no cyanosis, no clubbing Pulses: 2+ symmetric, upper and lower extremities, normal cap refill Neurological: alert, oriented x 3, CN2-12 intact, strength normal upper extremities and lower extremities, sensation normal throughout, DTRs 2+ throughout, no cerebellar signs, gait normal Psychiatric: normal affect, behavior normal, pleasant    Assessment and Plan :    Need for Streptococcus pneumoniae vaccination - Plan: Pneumococcal conjugate vaccine 13-valent  Encounter for long-term (current) use of medications  Coronary stent restenosis with uncertain cause, subsequent encounter  Chronic diastolic CHF (congestive heart failure) (HCC)  Chronic renal insufficiency, stage III (moderate)  Hyperlipidemia LDL goal <70; statin intolerant  Type 2 diabetes mellitus with complication, without long-term current use of insulin (HCC)  Statin intolerance  Obesity (BMI 30-39.9)  Status post total bilateral knee replacement  Endoleak post endovascular aneurysm repair, subsequent encounter  Hypertensive heart disease with chronic diastolic congestive heart failure (HCC)  Gastroesophageal  reflux disease, esophagitis presence not specified  ED (erectile dysfunction) of organic origin  he seems to be very stable on the above diagnoses as well as medications. Plan to continue him on that and follow-up routinely for his underlying diabetes.  Physical exam - discussed and counseled on healthy lifestyle, diet, exercise, preventative care, vaccinations, sick and well care, proper use of emergency dept and after hours care, and addressed their concerns.

## 2017-03-08 ENCOUNTER — Other Ambulatory Visit: Payer: Self-pay | Admitting: Cardiology

## 2017-03-14 ENCOUNTER — Encounter: Payer: Self-pay | Admitting: Vascular Surgery

## 2017-03-18 ENCOUNTER — Encounter: Payer: Self-pay | Admitting: Interventional Radiology

## 2017-03-21 ENCOUNTER — Ambulatory Visit
Admission: RE | Admit: 2017-03-21 | Discharge: 2017-03-21 | Disposition: A | Discharge status: Home / Self Care | Payer: Medicare Other | Source: Ambulatory Visit | Attending: Vascular Surgery | Admitting: Vascular Surgery

## 2017-03-21 DIAGNOSIS — T82330D Leakage of aortic (bifurcation) graft (replacement), subsequent encounter: Secondary | ICD-10-CM

## 2017-03-21 DIAGNOSIS — I714 Abdominal aortic aneurysm, without rupture: Secondary | ICD-10-CM | POA: Diagnosis not present

## 2017-03-21 DIAGNOSIS — IMO0001 Reserved for inherently not codable concepts without codable children: Secondary | ICD-10-CM

## 2017-03-21 MED ORDER — IOPAMIDOL (ISOVUE-370) INJECTION 76%
60.0000 mL | Freq: Once | INTRAVENOUS | Status: AC | PRN
Start: 2017-03-21 — End: 2017-03-21
  Administered 2017-03-21: 60 mL via INTRAVENOUS

## 2017-03-25 ENCOUNTER — Encounter: Payer: Self-pay | Admitting: Vascular Surgery

## 2017-03-25 ENCOUNTER — Ambulatory Visit (INDEPENDENT_AMBULATORY_CARE_PROVIDER_SITE_OTHER): Payer: Medicare Other | Admitting: Vascular Surgery

## 2017-03-25 VITALS — BP 148/74 | HR 70 | Temp 97.0°F | Resp 16 | Ht 69.0 in | Wt 212.0 lb

## 2017-03-25 DIAGNOSIS — IMO0001 Reserved for inherently not codable concepts without codable children: Secondary | ICD-10-CM

## 2017-03-25 DIAGNOSIS — T82330D Leakage of aortic (bifurcation) graft (replacement), subsequent encounter: Secondary | ICD-10-CM | POA: Diagnosis not present

## 2017-03-25 NOTE — Progress Notes (Signed)
Patient ID: [name], male   DOB: 12-05-1935, 81 y.o.   MRN: [id number]  Reason for Consult: AAA   Referred by Denita Lung, MD  Subjective:     HPI:  [name] is a 81 y.o. male follows up for recent embolization of a lumbar artery performed by Dr. Barbie Banner in interventional radiology. This year he is also undergone stabilization of an IMA by me that has also had knee surgery. He overall is recovered well from all these procedures he has no new back or abdominal pain. He has no complaints related to today's visit.  Past Medical History:  Diagnosis Date  . AAA (abdominal aortic aneurysm) ([place]) 08/30/12   Doppler 08/30/12 had a small amount of growth. It was a 4.2 x 4.3 cm greater in size than the previous one noted at 4 x 3.8.  05-21-15 followed by CT abd-Dr. Kellie Simmering.  . Adenomatous colon polyp   . Ankle edema    Chronic  . Aortic stenosis    mild AS 07/2016 echo  . Asthma    as a child  . CAD, multiple vessel 1985-2010   Most recent cath 01/01/09: 100% Occluded LAD after SP1, patent LIMA-distal LAD with apical 90% lesion.  Cx-OM1 widely patent stent into proximal bifurcating OM1 . Follow-on Cx => 70-80% -- non-amenable PCI. Widely patent 3 overlapping stents in midRCA with only 40% RPL stenosis; SVG- OM known to be occluded.  . Choledocholithiasis   . Cholelithiasis - with cholangitis    status post ERCP with removal of calculi and biliary stent placement.  . Chronic anemia    On iron supplement; history of positive guaiac - negative colonoscopy in 1996.; Thought to be related to hemorrhoids; status post hemorrhoidectomy  . Chronic back pain     multiple surgeries; C-spine and lumbar  . Diabetes mellitus    on oral medication  . Diverticulitis of colon 1996  . Diverticulosis   . Dyslipidemia, goal LDL below 70   . Dyspnea   . Erectile dysfunction   . Exertional dyspnea    Chronic baseline SOB with ambulation  . GERD (gastroesophageal reflux disease)   . H/O  unstable angina 06/2003, 12/2006, 05/2014   a) '04: Staged Taxus DES PCI to RCA and Cx-OM;;'08 - PCI to proximal ISR in RCA - Cypher DES; c) 05/2014: ISR in RCA & OM1, PCI with Promus P DES: RCA  2.75 x 12, OM1 3.0 mm x 8)  . H/O: pneumonia February 14  . Hemorrhoids   . Hiatal hernia   . History of nuclear stress test December 2013   LOW at Risk. Moderate region of mid to basal inferolateral scar without ischemia -- consistent with distal circumflex disease. Mild apical hypokinesis with an EF of 47%.  Marland Kitchen History of: ST elevation myocardial infarction (STEMI) involving left circumflex coronary artery with complication 2423   PTCA-circumflex; PCI in 1991  . Hypertension   . Irregular heart beat    PVCs and PACs, no arrhythmia recorded  . Moderate aortic stenosis by prior echocardiogram October 2016   Normal LV function - EF 55-60%.. Abnormal relaxation. Mild-moderate aortic stenosis (peak/mean gradient 18/10 mmH)  . Osteoarthritis of both knees    And back; multiple back surgeries, right knee arthroplasty and left knee arthroscopic surgery x2  . Pneumonia 2016  . S/P CABG x 2 1997   LIMA-LAD, SVG-OM  . Sleep apnea    pt. states he was told to return for f/u, to  be fitted for Cpap, but pt. reports that he didn't follow up-no cpap used   Family History  Problem Relation Age of Onset  . Arthritis Mother   . Diabetes Father   . Heart disease Father   . Stroke Sister   . Hypertension Sister   . Heart disease Sister   . Diabetes Sister   . Breast cancer Sister   . Heart disease Brother   . Ulcers Brother   . Colon cancer Neg Hx    Past Surgical History:  Procedure Laterality Date  . Abdominal and Lower Extremity Arterial Ultrasound  08/23/2012; 10/10/2013   Normal ABIs. Nonocclusive lower extremity disease. 4.2 cm x 4.3 cm infrarenal AAA;; 4.4 cm x 4.3 cm (essentially stable)   . ABDOMINAL AORTIC ENDOVASCULAR STENT GRAFT N/A 08/13/2015   Procedure: ABDOMINAL AORTIC ENDOVASCULAR STENT  GRAFT;  Surgeon: Mal Misty, MD;  Location: [place];  Service: Vascular;  Laterality: N/A;  . Anterior cervical plating  04/23/10   At C4-5 and a C6-7 utilizing two separate Biomet MaxAn plates.  . ANTERIOR LAT LUMBAR FUSION Left 11/22/2012   Procedure: ANTERIOR LATERAL LUMBAR FUSION 1 LEVEL;  Surgeon: Eustace Moore, MD;  Location: [place] [facility];  Service: Neurosurgery;  Laterality: Left;  Anterior Lateral Lumbar Fusion Lumbar Three-Four  . BACK SURGERY  1979 & x 10   pt. remarks, "I have had about 10 back surgeries"  . BILIARY STENT PLACEMENT N/A 07/03/2014   Procedure: BILIARY STENT PLACEMENT;  Surgeon: Inda Castle, MD;  Location: [place];  Service: Endoscopy;  Laterality: N/A;  . CARDIAC CATHETERIZATION  September 2004   None Occluded vein graft to OM; diffuse RCA disease in the mid vessel, 80% circumflex-OM stenosis; follow on AV groove circumflex with sequential 90% stenoses and intervening saccular dilation   . CARDIAC CATHETERIZATION  March 2010   4 abnormal Myoview showing apical thinning (possibly due to apical LAD 95%) : 100% Occluded LAD after SV1, distal LAD grafted via LIMA -apical 95% . Cx -OM1 w/patent stent extending into OM 1 . Follow on Cx - 70-80% - non-amenable PCI. RCA widely patent 3 overlapping stents in mRCA w/less than 40% stenosisin RPL; SVG-OM known occluded   . CATARACT EXTRACTION    . Cervical arthrodesis  04/23/10   Anterior cervical arthrodesis, C4-5, C6-7 utilizing 7-mm PEEK interbody cage packed with local autograft & Antifuse putty at C4-5 & an 8-mm cage at C6-7.  Marland Kitchen CERVICAL DISCECTOMY  04/23/10   Decompressive anterior carvical diskectomy. C4-5, C6-7  . CHOLECYSTECTOMY N/A 05/23/2015   Procedure: LAPAROSCOPIC CHOLECYSTECTOMY WITH INTRAOPERATIVE CHOLANGIOGRAM;  Surgeon: Alphonsa Overall, MD;  Location: [facility];  Service: General;  Laterality: N/A;  . [place]  . CORONARY ANGIOPLASTY  1985,1991,15   1985 lateral STEMI Circumflex PTCA  . CORONARY ARTERY  BYPASS GRAFT  1990   LIMA-LAD, SVG-OM  . ERCP N/A 07/03/2014   Procedure: ENDOSCOPIC RETROGRADE CHOLANGIOPANCREATOGRAPHY (ERCP);  Surgeon: Inda Castle, MD;  Location: [place];  Service: Endoscopy;  Laterality: N/A;  . ERCP N/A 07/05/2014   Procedure: ENDOSCOPIC RETROGRADE CHOLANGIOPANCREATOGRAPHY (ERCP);  Surgeon: Inda Castle, MD;  Location: [place];  Service: Endoscopy;  Laterality: N/A;  . ERCP N/A 06/30/2015   Procedure: ENDOSCOPIC RETROGRADE CHOLANGIOPANCREATOGRAPHY (ERCP);  Surgeon: Ladene Artist, MD;  Location: [facility];  Service: Endoscopy;  Laterality: N/A;  . EYE SURGERY    . IR ANGIOGRAM EXTREMITY LEFT  01/18/2017  . IR ANGIOGRAM PELVIS SELECTIVE OR SUPRASELECTIVE  01/18/2017  .  IR ANGIOGRAM SELECTIVE EACH ADDITIONAL VESSEL  01/18/2017  . IR AORTAGRAM ABDOMINAL SERIALOGRAM  01/18/2017  . IR EMBO ARTERIAL NOT HEMORR HEMANG INC GUIDE ROADMAPPING  01/18/2017  . IR RADIOLOGIST EVAL & MGMT  01/04/2017  . IR US GUIDE VASC ACCESS RIGHT  01/18/2017  . KNEE ARTHROSCOPY Left    x 2  . LEFT HEART CATHETERIZATION WITH CORONARY ANGIOGRAM N/A 05/15/2014   Procedure: LEFT HEART CATHETERIZATION WITH CORONARY ANGIOGRAM;  Surgeon: Sinclair Grooms, MD;  Location: [facility] CATH LAB;  Service: Cardiovascular;  Laterality: N/A;  . LEFT HEART CATHETERIZATION WITH CORONARY/GRAFT ANGIOGRAM N/A 06/28/2014   Procedure: LEFT HEART CATHETERIZATION WITH Beatrix Fetters;  Surgeon: Troy Sine, MD;  Location: [facility] CATH LAB;  Service: Cardiovascular;  Laterality: N/A;  . LUMBAR PERCUTANEOUS PEDICLE SCREW 1 LEVEL N/A 11/22/2012   Procedure: LUMBAR PERCUTANEOUS PEDICLE SCREW 1 LEVEL;  Surgeon: Eustace Moore, MD;  Location: [place] [facility];  Service: Neurosurgery;  Laterality: N/A;  Lumbar Three-Four Percutaneous Pedicle Screw, Lateral approach  . MINOR HEMORRHOIDECTOMY    . NM MYOVIEW LTD  December 2013   LOW RISK. Mmoderate region of mid to basal inferolateral scar without ischemia. Mild apical  hypokinesis with an EF of 47%.  Marland Kitchen PERCUTANEOUS CORONARY STENT INTERVENTION (PCI-S)  September 2004   PCI - RCA 2 overlapping Taxus DES 2.75 mm x 32 mm and 2.75 mm x 12 mm (3.0 mm); PCI-Cx-OM1 - Taxus DES 3.0 mm x 20 mm (3.1 mm);   Marland Kitchen PERCUTANEOUS CORONARY STENT INTERVENTION (PCI-S)  March 2008   80% ISR in proximal Taxus stent in RCA -- covered proximally with Cypher DES 3.0 mm x 12 mm  . PERCUTANEOUS CORONARY STENT INTERVENTION (PCI-S) N/A 05/17/2014   Procedure: PERCUTANEOUS CORONARY STENT INTERVENTION (PCI-S);  Surgeon: Sinclair Grooms, MD;  Location[facility] CATH LAB;  Service: Cardiovascular;  Laterality: N/A;  . PERIPHERAL VASCULAR CATHETERIZATION  11/03/2016   Procedure: Embolization;  Surgeon: Waynetta Sandy, MD;  Location: [place] CV LAB;  Service: Cardiovascular;;  . POSTERIOR CERVICAL FUSION/FORAMINOTOMY N/A 05/02/2013   Procedure: POSTERIOR CERVICAL FUSION/FORAMINOTOMY CERVICAL SEVEN THORACIC-ONE;  Surgeon: Eustace Moore, MD;  Location: [place] [facility];  Service: Neurosurgery;  Laterality: N/A;  POSTERIOR CERVICAL FUSION/FORAMINOTOMY CERVICAL SEVEN THORACIC-ONE  . SPHINCTEROTOMY N/A 06/30/2015   Procedure: SPHINCTEROTOMY;  Surgeon: Ladene Artist, MD;  Location: [facility];  Service: Endoscopy;  Laterality: N/A;  . TOTAL KNEE ARTHROPLASTY Right   . TOTAL KNEE ARTHROPLASTY Left 12/20/2016   Procedure: LEFT TOTAL KNEE ARTHROPLASTY;  Surgeon: Vickey Huger, MD;  Location: [place];  Service: Orthopedics;  Laterality: Left;  . TRANSTHORACIC ECHOCARDIOGRAM  December 2013; October 2016   a. EF 55-60%. mod LA dilation. Aortic Sclerosis;; b. EF 55-60%, Gr 1 DD, Mild-Mod AS (Peark - Mean Gradient 18-10 mmHg)  . TRANSTHORACIC ECHOCARDIOGRAM  07/2016   EF 45-50%. Inferolateral hypokinesis (correlates with scar noted on Myoview). GR 1 DD. Mild aortic stenosis. Mod LAE.  Marland Kitchen VISCERAL ANGIOGRAM  11/03/2016   Procedure: Visceral Angiogram;  Surgeon: Waynetta Sandy, MD;  Location: [place]  CV LAB;  Service: Cardiovascular;;    Short Social History:  Social History  Substance Use Topics  . Smoking status: Former Smoker    Types: Cigarettes    Quit date: 10/05/1983  . Smokeless tobacco: Never Used  . Alcohol use 0.6 oz/week    1 Glasses of wine per week     Comment:  4 oz. wine daily    Allergies  Allergen Reactions  . Oxycodone Shortness Of Breath and Cough  . Lisinopril Cough  . Statins Other (See Comments)    MYALGIAS   . Welchol [Colesevelam Hcl] Itching    Current Outpatient Prescriptions  Medication Sig Dispense Refill  . acetaminophen (TYLENOL) 500 MG tablet Take 1,000 mg by mouth every 6 (six) hours as needed for mild pain, moderate pain or headache. Reported on 03/16/2016    . albuterol (PROVENTIL HFA;VENTOLIN HFA) 108 (90 Base) MCG/ACT inhaler Inhale 2 puffs into the lungs every 6 (six) hours as needed for wheezing or shortness of breath. 1 Inhaler 0  . amLODipine (NORVASC) 5 MG tablet TAKE 5 MG BY MOUTH DAILY....need appointment before further refills (Patient taking differently: Take 5 mg by mouth daily. ) 90 tablet 0  . Choline Fenofibrate (FENOFIBRIC ACID) 135 MG CPDR TAKE ONE CAPSULE BY MOUTH ONCE DAILY 90 capsule 1  . clopidogrel (PLAVIX) 75 MG tablet TAKE ONE TABLET BY MOUTH ONCE DAILY 90 tablet 3  . ferrous sulfate 325 (65 FE) MG tablet Take 325 mg by mouth daily with breakfast.    . glucose blood test strip Pt uses one touch verio test strips and tests twice daily. (Patient taking differently: 1 each by Other route every other day. ) 100 each 2  . losartan (COZAAR) 100 MG tablet TAKE ONE TABLET BY MOUTH ONCE DAILY 90 tablet 1  . Multiple Vitamins-Minerals (MULTIVITAMIN WITH MINERALS) tablet Take 1 tablet by mouth daily.     . pantoprazole (PROTONIX) 40 MG tablet TAKE ONE TABLET BY MOUTH ONCE DAILY 90 tablet 3  . pioglitazone-metformin (ACTOPLUS MET) 15-500 MG tablet TAKE ONE TABLET BY MOUTH ONCE DAILY 90 tablet 0  . sildenafil (REVATIO) 20 MG tablet  TAKE 2-5 TABLETS BY MOUTH DAILY AS NEEDED (Patient taking differently: TAKE 2-5 TABLETS BY MOUTH DAILY AS NEEDED FOR ERECTILE DYSFUNCTION) 50 tablet 5  . aspirin EC 325 MG EC tablet Take 1 tablet (325 mg total) by mouth daily with breakfast. (Patient not taking: Reported on 03/25/2017) 30 tablet 0   No current facility-administered medications for this visit.     Review of Systems  Constitutional:  Constitutional negative. HENT: HENT negative.  Eyes: Eyes negative.  Respiratory: Respiratory negative.  Cardiovascular: Cardiovascular negative.  GI: Gastrointestinal negative.  Musculoskeletal: Musculoskeletal negative.  Skin: Skin negative.  Neurological: Neurological negative. Hematologic: Hematologic/lymphatic negative.  Psychiatric: Psychiatric negative.        Objective:  Objective   Vitals:   03/25/17 0918 03/25/17 0924  BP: (!) 153/72 (!) 148/74  Pulse: 70 70  Resp: 16   Temp: 97 F (36.1 C)   TempSrc: Oral   SpO2: 99%   Weight: 212 lb (96.2 kg)   Height: _0  (1.753 m)    Body mass index is 31.31 kg/m.  Physical Exam  Constitutional: He is oriented to person, place, and time. He appears well-developed.  HENT:  Head: Normocephalic.  Cardiovascular: Normal rate.   Pulmonary/Chest: Effort normal.  Abdominal: Soft. He exhibits no mass.  Musculoskeletal: Normal range of motion. He exhibits no edema.  Neurological: He is alert and oriented to person, place, and time.  Skin: Skin is warm and dry.  Psychiatric: He has a normal mood and affect. His behavior is normal. Judgment and thought content normal.    Data: IMPRESSION: VASCULAR  1. Significant interval improvement in the conspicuity of the type 2 endoleak following coil embolization of the right L4 lumbar artery. There is no more evidence of contrast enhancement  posterior to the aortic limbs. Only a trace amount of contrast enhancement can be seen within the excluded aneurysm sac anterior to the limbs.  The aneurysm sac itself remains stable at 5.6 x 4.5 cm in diameter. 2. Prior changes of endovascular aortic repair and coil embolization of the IMA and right L4 lumbar artery. 3. 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 4. Coronary artery calcifications.      Assessment/Plan:     81 year old male follows up from recent coil embolization of a type II endoleak by both myself followed by Dr. Barbie Banner with interventional radiology. He is also had a knee surgery which he has recovered well from. He has no new complaints related to today's visit physically no new back or abdominal pain and is overall doing well. His aneurysm is stable at 5.6 cm or back to June 2017. This time there is a small suggestion of persistent leak that I cannot identify the source on CTA. Given the stable size of his aneurysm at this time we'll follow him up in 6 months with repeat duplex. We discussed the signs and symptoms of rupture which he knows well. Follow-up in 6 months.    Waynetta Sandy MD [facility]

## 2017-03-25 NOTE — Progress Notes (Signed)
Vitals:   03/25/17 0918  BP: (!) 153/72  Pulse: 70  Resp: 16  Temp: 97 F (36.1 C)  TempSrc: Oral  SpO2: 99%  Weight: 212 lb (96.2 kg)  Height: 5\' 9"  (1.753 m)

## 2017-03-28 ENCOUNTER — Telehealth: Payer: Self-pay | Admitting: Family Medicine

## 2017-03-28 MED ORDER — PIOGLITAZONE HCL-METFORMIN HCL 15-500 MG PO TABS: 1.0000 | ORAL_TABLET | Freq: Every day | ORAL | 0 refills | Status: DC

## 2017-03-28 NOTE — Telephone Encounter (Signed)
Pt wife called and states that [name] needs a refill on his metformin pt wife states that he needs a 90 day supply pt uses [facility] [place] pt wife would like a call back after sent in [phone]

## 2017-03-28 NOTE — Telephone Encounter (Signed)
rx sent for 90 days and left message for patient at number provide.

## 2017-03-29 NOTE — Addendum Note (Signed)
Addended by: Lianne Cure A on: 03/29/2017 04:18 PM   Modules accepted: Orders

## 2017-03-30 ENCOUNTER — Encounter: Payer: Self-pay | Admitting: Cardiology

## 2017-04-20 ENCOUNTER — Telehealth: Payer: Self-pay | Admitting: Radiology

## 2017-04-20 ENCOUNTER — Encounter (HOSPITAL_COMMUNITY): Payer: Self-pay

## 2017-04-20 ENCOUNTER — Inpatient Hospital Stay (HOSPITAL_COMMUNITY)
Admission: EM | Admit: 2017-04-20 | Discharge: 2017-04-22 | DRG: 282 | Disposition: A | Discharge status: Home / Self Care | Payer: Medicare Other | Attending: Cardiovascular Disease | Admitting: Cardiovascular Disease

## 2017-04-20 ENCOUNTER — Emergency Department (HOSPITAL_COMMUNITY): Payer: Medicare Other

## 2017-04-20 ENCOUNTER — Encounter (HOSPITAL_COMMUNITY)
Admission: EM | Disposition: A | Discharge status: Home / Self Care | Payer: Self-pay | Source: Home / Self Care | Attending: Cardiovascular Disease

## 2017-04-20 DIAGNOSIS — D649 Anemia, unspecified: Secondary | ICD-10-CM | POA: Diagnosis not present

## 2017-04-20 DIAGNOSIS — Z981 Arthrodesis status: Secondary | ICD-10-CM

## 2017-04-20 DIAGNOSIS — I200 Unstable angina: Secondary | ICD-10-CM | POA: Diagnosis present

## 2017-04-20 DIAGNOSIS — Z955 Presence of coronary angioplasty implant and graft: Secondary | ICD-10-CM

## 2017-04-20 DIAGNOSIS — Z79899 Other long term (current) drug therapy: Secondary | ICD-10-CM

## 2017-04-20 DIAGNOSIS — I440 Atrioventricular block, first degree: Secondary | ICD-10-CM | POA: Diagnosis present

## 2017-04-20 DIAGNOSIS — Z888 Allergy status to other drugs, medicaments and biological substances status: Secondary | ICD-10-CM

## 2017-04-20 DIAGNOSIS — R079 Chest pain, unspecified: Secondary | ICD-10-CM

## 2017-04-20 DIAGNOSIS — M549 Dorsalgia, unspecified: Secondary | ICD-10-CM | POA: Diagnosis present

## 2017-04-20 DIAGNOSIS — Z9119 Patient's noncompliance with other medical treatment and regimen: Secondary | ICD-10-CM

## 2017-04-20 DIAGNOSIS — J449 Chronic obstructive pulmonary disease, unspecified: Secondary | ICD-10-CM | POA: Diagnosis present

## 2017-04-20 DIAGNOSIS — Z8679 Personal history of other diseases of the circulatory system: Secondary | ICD-10-CM

## 2017-04-20 DIAGNOSIS — E119 Type 2 diabetes mellitus without complications: Secondary | ICD-10-CM | POA: Diagnosis present

## 2017-04-20 DIAGNOSIS — M79601 Pain in right arm: Secondary | ICD-10-CM | POA: Diagnosis present

## 2017-04-20 DIAGNOSIS — I119 Hypertensive heart disease without heart failure: Secondary | ICD-10-CM | POA: Diagnosis not present

## 2017-04-20 DIAGNOSIS — E663 Overweight: Secondary | ICD-10-CM | POA: Diagnosis present

## 2017-04-20 DIAGNOSIS — Z7982 Long term (current) use of aspirin: Secondary | ICD-10-CM

## 2017-04-20 DIAGNOSIS — I25110 Atherosclerotic heart disease of native coronary artery with unstable angina pectoris: Secondary | ICD-10-CM | POA: Diagnosis not present

## 2017-04-20 DIAGNOSIS — K219 Gastro-esophageal reflux disease without esophagitis: Secondary | ICD-10-CM | POA: Diagnosis present

## 2017-04-20 DIAGNOSIS — Z96652 Presence of left artificial knee joint: Secondary | ICD-10-CM | POA: Diagnosis present

## 2017-04-20 DIAGNOSIS — Z87891 Personal history of nicotine dependence: Secondary | ICD-10-CM

## 2017-04-20 DIAGNOSIS — I350 Nonrheumatic aortic (valve) stenosis: Secondary | ICD-10-CM | POA: Diagnosis not present

## 2017-04-20 DIAGNOSIS — Z7902 Long term (current) use of antithrombotics/antiplatelets: Secondary | ICD-10-CM

## 2017-04-20 DIAGNOSIS — M25511 Pain in right shoulder: Secondary | ICD-10-CM | POA: Diagnosis present

## 2017-04-20 DIAGNOSIS — Z6831 Body mass index (BMI) 31.0-31.9, adult: Secondary | ICD-10-CM

## 2017-04-20 DIAGNOSIS — G4730 Sleep apnea, unspecified: Secondary | ICD-10-CM | POA: Diagnosis present

## 2017-04-20 DIAGNOSIS — M79602 Pain in left arm: Secondary | ICD-10-CM | POA: Diagnosis present

## 2017-04-20 DIAGNOSIS — Z951 Presence of aortocoronary bypass graft: Secondary | ICD-10-CM

## 2017-04-20 DIAGNOSIS — M170 Bilateral primary osteoarthritis of knee: Secondary | ICD-10-CM | POA: Diagnosis present

## 2017-04-20 DIAGNOSIS — I214 Non-ST elevation (NSTEMI) myocardial infarction: Secondary | ICD-10-CM | POA: Diagnosis not present

## 2017-04-20 DIAGNOSIS — Z885 Allergy status to narcotic agent status: Secondary | ICD-10-CM

## 2017-04-20 DIAGNOSIS — E785 Hyperlipidemia, unspecified: Secondary | ICD-10-CM | POA: Diagnosis present

## 2017-04-20 DIAGNOSIS — G8929 Other chronic pain: Secondary | ICD-10-CM | POA: Diagnosis present

## 2017-04-20 HISTORY — PX: LEFT HEART CATH AND CORS/GRAFTS ANGIOGRAPHY: CATH118250

## 2017-04-20 LAB — CBC WITH DIFFERENTIAL/PLATELET
Basophils Absolute: 0 10*3/uL (ref 0.0–0.1)
Basophils Relative: 1 %
Eosinophils Absolute: 0 10*3/uL (ref 0.0–0.7)
Eosinophils Relative: 1 %
HCT: 35 % — ABNORMAL LOW (ref 39.0–52.0)
Hemoglobin: 11.3 g/dL — ABNORMAL LOW (ref 13.0–17.0)
Lymphocytes Relative: 23 %
Lymphs Abs: 1 10*3/uL (ref 0.7–4.0)
MCH: 30.1 pg (ref 26.0–34.0)
MCHC: 32.3 g/dL (ref 30.0–36.0)
MCV: 93.1 fL (ref 78.0–100.0)
Monocytes Absolute: 0.4 10*3/uL (ref 0.1–1.0)
Monocytes Relative: 9 %
Neutro Abs: 2.9 10*3/uL (ref 1.7–7.7)
Neutrophils Relative %: 66 %
Platelets: 171 10*3/uL (ref 150–400)
RBC: 3.76 MIL/uL — ABNORMAL LOW (ref 4.22–5.81)
RDW: 15.1 % (ref 11.5–15.5)
WBC: 4.3 10*3/uL (ref 4.0–10.5)

## 2017-04-20 LAB — BASIC METABOLIC PANEL
Anion gap: 6 (ref 5–15)
BUN: 19 mg/dL (ref 6–20)
CO2: 22 mmol/L (ref 22–32)
Calcium: 9 mg/dL (ref 8.9–10.3)
Chloride: 110 mmol/L (ref 101–111)
Creatinine, Ser: 1.43 mg/dL — ABNORMAL HIGH (ref 0.61–1.24)
GFR calc Af Amer: 52 mL/min — ABNORMAL LOW (ref >60)
GFR calc non Af Amer: 45 mL/min — ABNORMAL LOW (ref >60)
Glucose, Bld: 99 mg/dL (ref 65–99)
Potassium: 4.1 mmol/L (ref 3.5–5.1)
Sodium: 138 mmol/L (ref 135–145)

## 2017-04-20 LAB — GLUCOSE, CAPILLARY
GLUCOSE-CAPILLARY: 88 mg/dL (ref 65–99)
Glucose-Capillary: 62 mg/dL — ABNORMAL LOW (ref 65–99)

## 2017-04-20 LAB — PROTIME-INR
INR: 0.99
PROTHROMBIN TIME: 13.1 s (ref 11.4–15.2)

## 2017-04-20 LAB — TROPONIN I
TROPONIN I: 3.47 ng/mL — AB (ref <0.03)
Troponin I: 0.05 ng/mL (ref <0.03)

## 2017-04-20 IMAGING — CT CT CTA ABD/PEL W/CM AND/OR W/O CM
3 of 10 series · 11 of 36 positions shown, 16 images · IV contrast (75CC ISOVUE 370)
Comparison: 10/07/2016

CLINICAL DATA: Aortic stent graft follow-up

EXAM:
CTA ABDOMEN AND PELVIS wITHOUT AND WITH CONTRAST
TECHNIQUE: Multidetector CT imaging of the abdomen and pelvis was performed
using the standard protocol during bolus administration of
intravenous contrast. Multiplanar reconstructed images and MIPs were
obtained and reviewed to evaluate the vascular anatomy.
CONTRAST:  75 cc Isovue 370

[Series 5: angio 2.5 · axial · 0.73mm/px · z∈[-321,-1]mm · 5 of 193 slices shown, 10 images]
[im 33/193  soft-tissue]
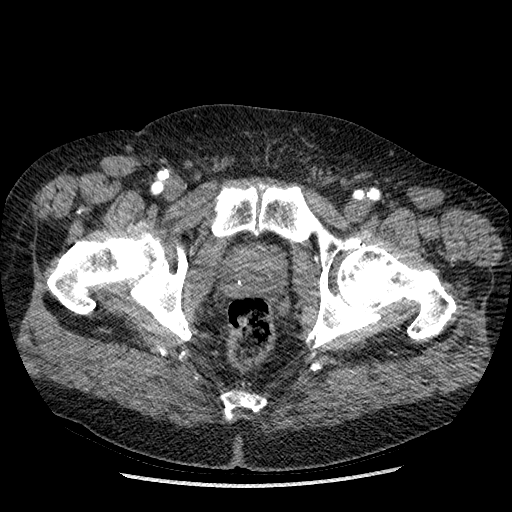
[im 33/193  bone]
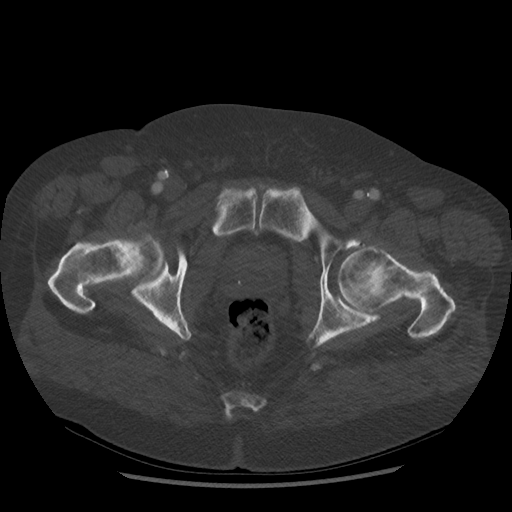
[im 65/193  soft-tissue]
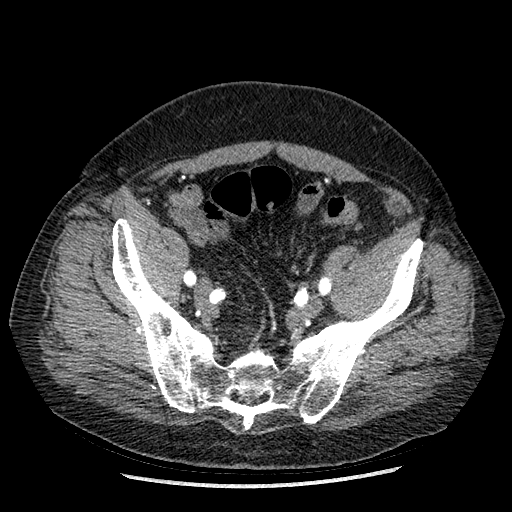
[im 65/193  lung]
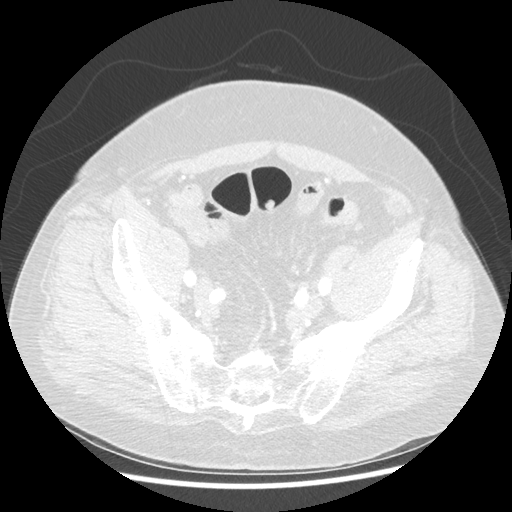
[im 97/193  soft-tissue]
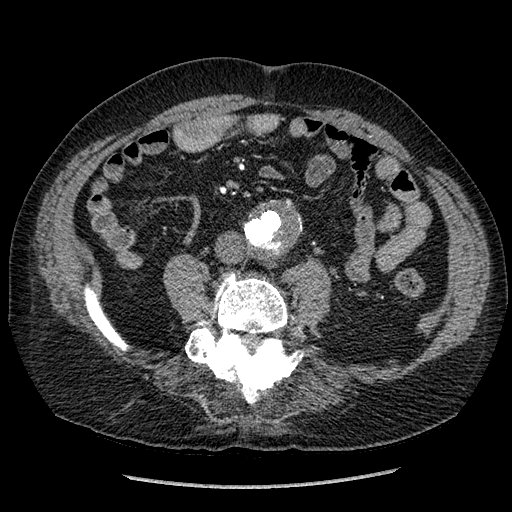
[im 97/193  lung]
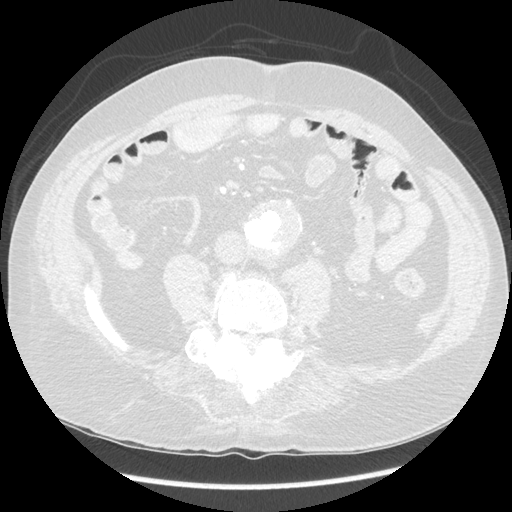
[im 129/193  soft-tissue]
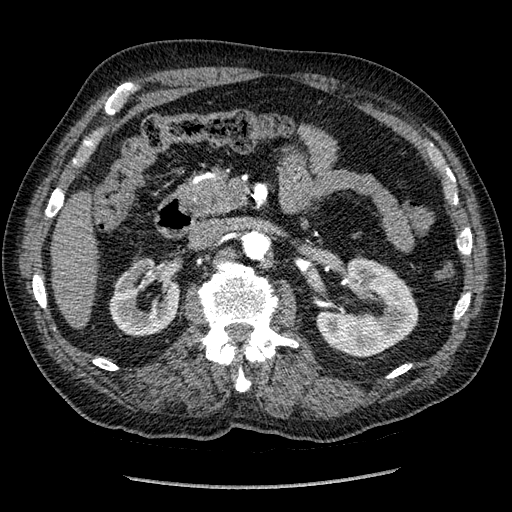
[im 129/193  lung]
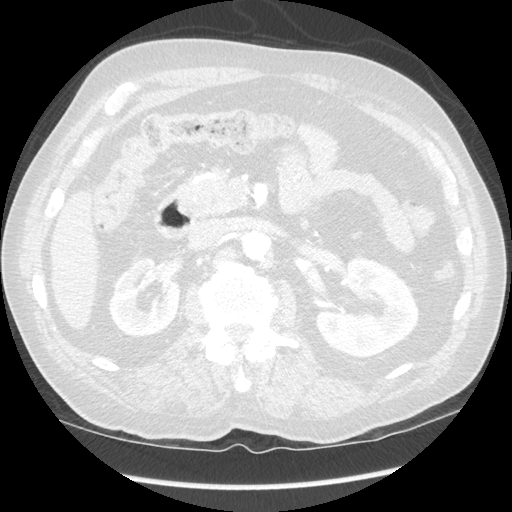
[im 161/193  soft-tissue]
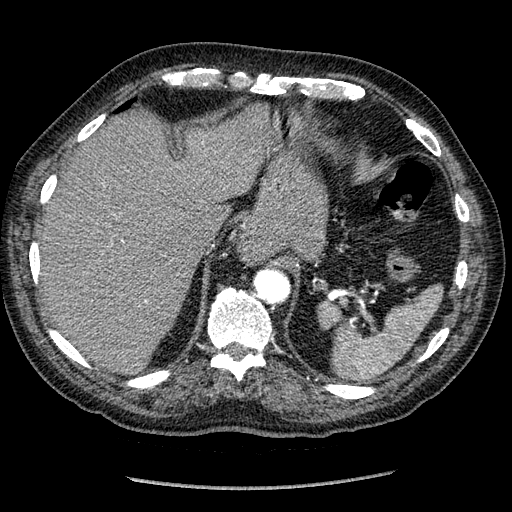
[im 161/193  lung]
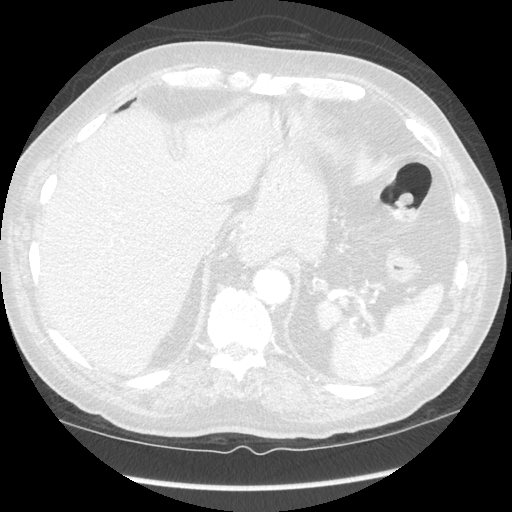

[Series 602: sagittal body · sagittal · 0.94mm/px · 3 of 151 slices shown (1 of 2)]
[im 38/151  soft-tissue]
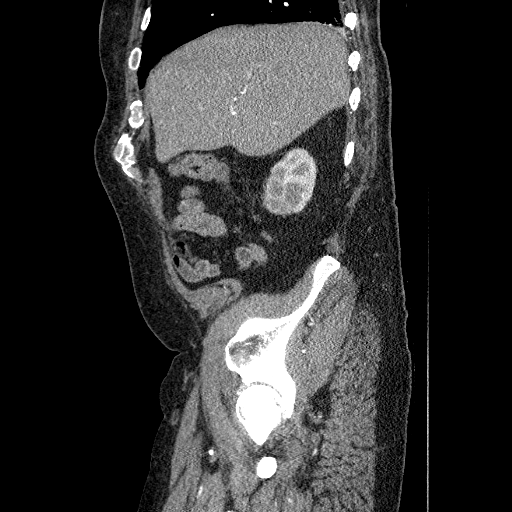
[im 76/151  soft-tissue]
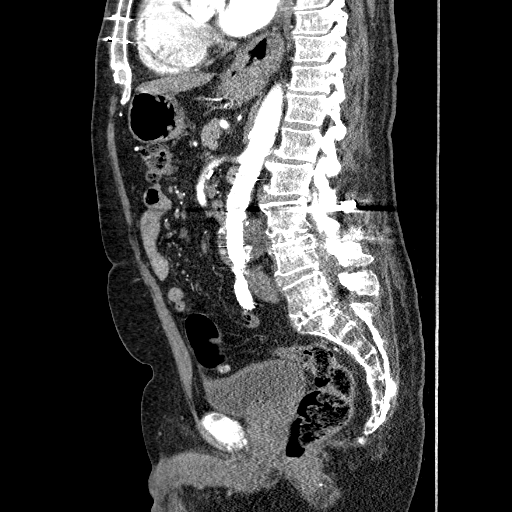
[im 113/151  soft-tissue]
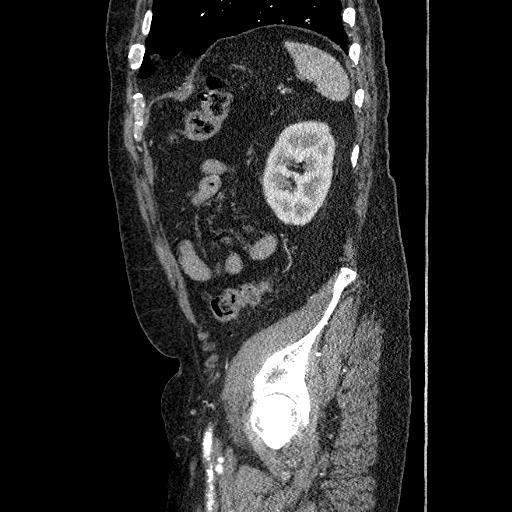

[Series 607: sagittal body · sagittal · 0.73mm/px · 3 of 151 slices shown (2 of 2)]
[im 38/151  soft-tissue]
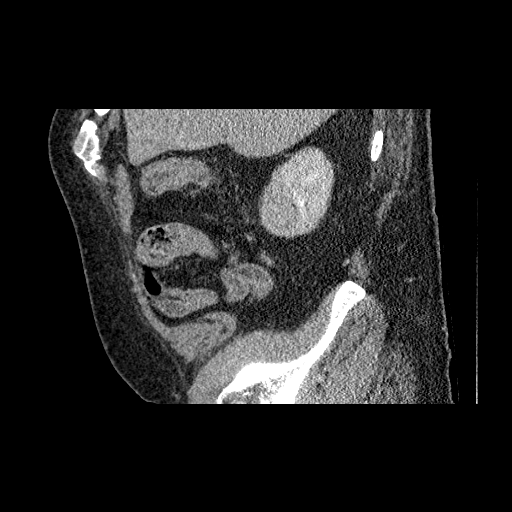
[im 76/151  soft-tissue]
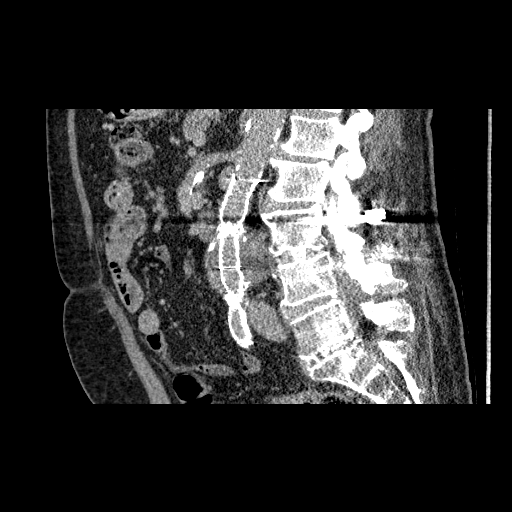
[im 113/151  soft-tissue]
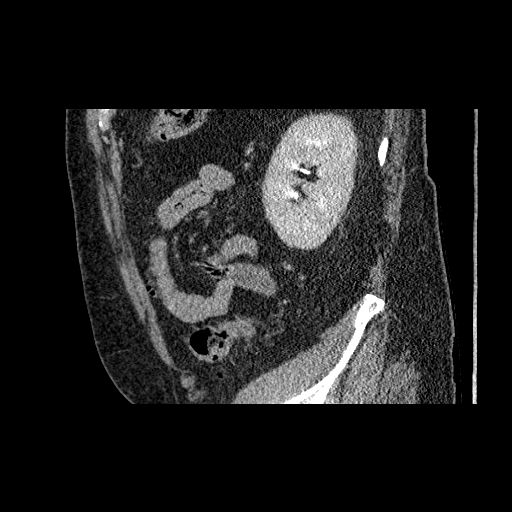

[11 of 36 positions shown; findings below may reference images not displayed]

FINDINGS: VASCULAR

Aorta: Aorto bi-iliac stent graft is stable in position. Maximal sac
diameter is stable at 5.6 x 4.5 cm. The type 2 endoleak is again
noted emanating from a right L4 lumbar artery. Embolization coils
have been placed in the IMA. There is some enhancement in the IMA
stump above the coils likely emanating from the endoleak. IMA
branches are patent.

Celiac: Patent.

SMA: Patent and ectatic.

Renals: There are atherosclerotic changes at the origin of the renal
arteries. No significant narrowing. A tiny accessory right lower
pole renal artery has been sacrificed.

IMA: Coil embolization of the proximal IMA is noted. Branches
reconstitute. The IMA stump opacifies likely from the endoleak.

Inflow: Mid right common iliac artery landing zone is patent. Common
and external iliac arteries are patent. Internal iliac artery is
patent.

Mid left common iliac artery landing zone is patent. Left common,
internal, and external iliac arteries are patent.

Veins: Renal veins are patent. IVC is patent. Common iliac veins are
patent.

Review of the MIP images confirms the above findings.

NON-VASCULAR

Lower chest: Dependent atelectasis.

Hepatobiliary: There is focal transient enhancement in the inferior
right lobe of the liver are. See image 58 of series 5. This resolves
and most likely represents a vascular phenomenon. No definite liver
mass. Postcholecystectomy.

Pancreas: Unremarkable.

Spleen: Unremarkable.

Adrenals/Urinary Tract: Fat containing lesion in the left adrenal
gland is stable. Right adrenal gland is normal. Left kidney is
unremarkable. Atrophy of the right kidney is present.

Stomach/Bowel: Moderate size hiatal hernia. No obvious mass in the
colon. Normal appendix. Diverticulosis of the colon. No evidence of
small-bowel obstruction.

Lymphatic: No evidence of retroperitoneal adenopathy.

Reproductive: Prostate remains mildly prominent.  Normal bladder.

Other: No free-fluid.

Musculoskeletal: No vertebral compression deformity. Lumbar fusion
hardware is in place. There is significant spinal stenosis just
above the fusion level.
IMPRESSION: VASCULAR

After embolization of the IMA, the type 2 endoleak persists and sac
diameter is stable with a maximal diameter of 5.6 x 4.5 cm. The
culprit is a persistent lumbar artery.

NON-VASCULAR

Chronic changes.

## 2017-04-20 SURGERY — LEFT HEART CATH AND CORS/GRAFTS ANGIOGRAPHY
Anesthesia: LOCAL

## 2017-04-20 MED ORDER — SODIUM CHLORIDE 0.9% FLUSH
3.0000 mL | Freq: Two times a day (BID) | INTRAVENOUS | Status: DC
  Administered 2017-04-21: 22:00:00 3 mL via INTRAVENOUS

## 2017-04-20 MED ORDER — ACTIVE PARTNERSHIP FOR HEALTH OF YOUR HEART BOOK
Freq: Once | Status: AC
  Administered 2017-04-20: 21:00:00
  Filled 2017-04-20: qty 1

## 2017-04-20 MED ORDER — HEPARIN (PORCINE) IN NACL 100-0.45 UNIT/ML-% IJ SOLN
1300.0000 [IU]/h | INTRAMUSCULAR | Status: DC
  Administered 2017-04-21 – 2017-04-22 (×2): 1300 [IU]/h via INTRAVENOUS
  Filled 2017-04-20 (×2): qty 250

## 2017-04-20 MED ORDER — NITROGLYCERIN 0.4 MG SL SUBL: 0.4000 mg | SUBLINGUAL_TABLET | SUBLINGUAL | Status: DC | PRN

## 2017-04-20 MED ORDER — HEPARIN (PORCINE) IN NACL 2-0.9 UNIT/ML-% IJ SOLN
INTRAMUSCULAR | Status: AC | PRN
  Administered 2017-04-20: 1500 mL

## 2017-04-20 MED ORDER — DEXTROSE 50 % IV SOLN
25.0000 mL | INTRAVENOUS | Status: DC | PRN
  Administered 2017-04-20: 25 mL via INTRAVENOUS

## 2017-04-20 MED ORDER — SODIUM CHLORIDE 0.9 % IV SOLN
250.0000 mL | INTRAVENOUS | Status: DC | PRN
  Administered 2017-04-22: 05:00:00 250 mL via INTRAVENOUS

## 2017-04-20 MED ORDER — SODIUM CHLORIDE 0.9 % WEIGHT BASED INFUSION: 1.0000 mL/kg/h | INTRAVENOUS | Status: DC

## 2017-04-20 MED ORDER — PANTOPRAZOLE SODIUM 40 MG PO TBEC
40.0000 mg | DELAYED_RELEASE_TABLET | Freq: Every day | ORAL | Status: DC
  Administered 2017-04-21 – 2017-04-22 (×2): 40 mg via ORAL
  Filled 2017-04-20 (×2): qty 1

## 2017-04-20 MED ORDER — INSULIN ASPART 100 UNIT/ML ~~LOC~~ SOLN: 0.0000 [IU] | Freq: Every day | SUBCUTANEOUS | Status: DC

## 2017-04-20 MED ORDER — CLOPIDOGREL BISULFATE 75 MG PO TABS
75.0000 mg | ORAL_TABLET | Freq: Every day | ORAL | Status: DC
  Administered 2017-04-21 – 2017-04-22 (×2): 75 mg via ORAL
  Filled 2017-04-20 (×2): qty 1

## 2017-04-20 MED ORDER — LIDOCAINE HCL (PF) 1 % IJ SOLN
INTRAMUSCULAR | Status: AC
  Filled 2017-04-20: qty 30

## 2017-04-20 MED ORDER — ASPIRIN 81 MG PO CHEW: 81.0000 mg | CHEWABLE_TABLET | Freq: Every day | ORAL | Status: DC

## 2017-04-20 MED ORDER — DEXTROSE 50 % IV SOLN
INTRAVENOUS | Status: AC
  Filled 2017-04-20: qty 50

## 2017-04-20 MED ORDER — ONDANSETRON HCL 4 MG/2ML IJ SOLN: 4.0000 mg | Freq: Four times a day (QID) | INTRAMUSCULAR | Status: DC | PRN

## 2017-04-20 MED ORDER — SODIUM CHLORIDE 0.9 % IV SOLN: INTRAVENOUS | Status: DC

## 2017-04-20 MED ORDER — FENOFIBRIC ACID 135 MG PO CPDR: 1.0000 | DELAYED_RELEASE_CAPSULE | Freq: Every day | ORAL | Status: DC

## 2017-04-20 MED ORDER — HEART ATTACK BOUNCING BOOK
Freq: Once | Status: AC
  Administered 2017-04-20: 21:00:00
  Filled 2017-04-20: qty 1

## 2017-04-20 MED ORDER — SODIUM CHLORIDE 0.9% FLUSH: 3.0000 mL | Freq: Two times a day (BID) | INTRAVENOUS | Status: DC

## 2017-04-20 MED ORDER — HEPARIN (PORCINE) IN NACL 100-0.45 UNIT/ML-% IJ SOLN
1300.0000 [IU]/h | INTRAMUSCULAR | Status: DC
  Administered 2017-04-20: 1300 [IU]/h via INTRAVENOUS
  Filled 2017-04-20: qty 250

## 2017-04-20 MED ORDER — HEPARIN (PORCINE) IN NACL 2-0.9 UNIT/ML-% IJ SOLN
INTRAMUSCULAR | Status: AC
  Filled 2017-04-20: qty 1000

## 2017-04-20 MED ORDER — AMLODIPINE BESYLATE 5 MG PO TABS
5.0000 mg | ORAL_TABLET | Freq: Every day | ORAL | Status: DC
  Administered 2017-04-21 – 2017-04-22 (×2): 5 mg via ORAL
  Filled 2017-04-20 (×2): qty 1

## 2017-04-20 MED ORDER — INSULIN ASPART 100 UNIT/ML ~~LOC~~ SOLN
0.0000 [IU] | Freq: Three times a day (TID) | SUBCUTANEOUS | Status: DC
  Administered 2017-04-21 (×2): 1 [IU] via SUBCUTANEOUS

## 2017-04-20 MED ORDER — ACETAMINOPHEN 325 MG PO TABS
650.0000 mg | ORAL_TABLET | ORAL | Status: DC | PRN
  Administered 2017-04-21: 650 mg via ORAL
  Filled 2017-04-20: qty 2

## 2017-04-20 MED ORDER — ASPIRIN EC 81 MG PO TBEC
81.0000 mg | DELAYED_RELEASE_TABLET | Freq: Every day | ORAL | Status: DC
  Administered 2017-04-21 – 2017-04-22 (×2): 81 mg via ORAL
  Filled 2017-04-20 (×2): qty 1

## 2017-04-20 MED ORDER — FENOFIBRATE 160 MG PO TABS
160.0000 mg | ORAL_TABLET | Freq: Every day | ORAL | Status: DC
  Administered 2017-04-21 – 2017-04-22 (×2): 160 mg via ORAL
  Filled 2017-04-20 (×2): qty 1

## 2017-04-20 MED ORDER — IOPAMIDOL (ISOVUE-370) INJECTION 76%
INTRAVENOUS | Status: AC
  Filled 2017-04-20: qty 125

## 2017-04-20 MED ORDER — SODIUM CHLORIDE 0.9 % WEIGHT BASED INFUSION: 3.0000 mL/kg/h | INTRAVENOUS | Status: DC

## 2017-04-20 MED ORDER — HEPARIN BOLUS VIA INFUSION
4000.0000 [IU] | Freq: Once | INTRAVENOUS | Status: AC
  Administered 2017-04-20: 4000 [IU] via INTRAVENOUS
  Filled 2017-04-20: qty 4000

## 2017-04-20 MED ORDER — LIDOCAINE HCL (PF) 1 % IJ SOLN
INTRAMUSCULAR | Status: DC | PRN
  Administered 2017-04-20: 15 mL via INTRADERMAL

## 2017-04-20 MED ORDER — HYDRALAZINE HCL 20 MG/ML IJ SOLN: 10.0000 mg | INTRAMUSCULAR | Status: DC | PRN

## 2017-04-20 MED ORDER — IOPAMIDOL (ISOVUE-370) INJECTION 76%
INTRAVENOUS | Status: DC | PRN
  Administered 2017-04-20: 85 mL via INTRA_ARTERIAL

## 2017-04-20 MED ORDER — SODIUM CHLORIDE 0.9% FLUSH: 3.0000 mL | INTRAVENOUS | Status: DC | PRN

## 2017-04-20 MED ORDER — SODIUM CHLORIDE 0.9 % IV SOLN: 250.0000 mL | INTRAVENOUS | Status: DC | PRN

## 2017-04-20 MED ORDER — ASPIRIN 81 MG PO CHEW: 81.0000 mg | CHEWABLE_TABLET | ORAL | Status: DC

## 2017-04-20 MED ORDER — ACETAMINOPHEN 325 MG PO TABS: 650.0000 mg | ORAL_TABLET | ORAL | Status: DC | PRN

## 2017-04-20 MED ORDER — NITROGLYCERIN IN D5W 200-5 MCG/ML-% IV SOLN
0.0000 ug/min | INTRAVENOUS | Status: DC
  Administered 2017-04-20: 5 ug/min via INTRAVENOUS
  Administered 2017-04-21: 20 ug/min via INTRAVENOUS
  Administered 2017-04-22: 05:00:00 25 ug/min via INTRAVENOUS
  Filled 2017-04-20 (×2): qty 250

## 2017-04-20 MED ORDER — ALBUTEROL SULFATE (2.5 MG/3ML) 0.083% IN NEBU: 3.0000 mL | INHALATION_SOLUTION | Freq: Four times a day (QID) | RESPIRATORY_TRACT | Status: DC | PRN

## 2017-04-20 MED ORDER — FERROUS SULFATE 325 (65 FE) MG PO TABS
325.0000 mg | ORAL_TABLET | Freq: Every day | ORAL | Status: DC
  Administered 2017-04-21 – 2017-04-22 (×2): 325 mg via ORAL
  Filled 2017-04-20 (×2): qty 1

## 2017-04-20 SURGICAL SUPPLY — 8 items
CATH INFINITI MULTIPACK ST 5F (CATHETERS) ×1 IMPLANT
KIT HEART LEFT (KITS) ×2 IMPLANT
PACK CARDIAC CATHETERIZATION (CUSTOM PROCEDURE TRAY) ×2 IMPLANT
SHEATH PINNACLE 5F 10CM (SHEATH) ×1 IMPLANT
SYR MEDRAD MARK V 150ML (SYRINGE) ×2 IMPLANT
TRANSDUCER W/STOPCOCK (MISCELLANEOUS) ×2 IMPLANT
TUBING CIL FLEX 10 FLL-RA (TUBING) ×2 IMPLANT
WIRE EMERALD 3MM-J .035X150CM (WIRE) ×1 IMPLANT

## 2017-04-20 NOTE — Progress Notes (Signed)
ANTICOAGULATION CONSULT NOTE - Initial Consult  Pharmacy Consult for heparin Indication: chest pain/ACS  Allergies  Allergen Reactions  . Oxycodone Shortness Of Breath and Cough  . Lisinopril Cough  . Statins Other (See Comments)    MYALGIAS   . Welchol [Colesevelam Hcl] Itching    Patient Measurements: Height: 5\' 9"  (175.3 cm) Weight: 215 lb (97.5 kg) IBW/kg (Calculated) : 70.7 Heparin Dosing Weight: 91.1kg  Vital Signs: Temp: 97.8 F (36.6 C) (07/18 1102) Temp Source: Oral (07/18 1102) BP: 130/92 (07/18 1415) Pulse Rate: 52 (07/18 1415)  Labs:  Recent Labs  04/20/17 1147  HGB 11.3*  HCT 35.0*  PLT 171  CREATININE 1.43*  TROPONINI 0.05*    Estimated Creatinine Clearance: 48.2 mL/min (A) (by C-G formula based on SCr of 1.43 mg/dL (H)).   Medical History: Past Medical History:  Diagnosis Date  . AAA (abdominal aortic aneurysm) ([place]) 08/30/12   Doppler 08/30/12 had a small amount of growth. It was a 4.2 x 4.3 cm greater in size than the previous one noted at 4 x 3.8.  05-21-15 followed by CT abd-Dr. Kellie Simmering.  . Adenomatous colon polyp   . Ankle edema    Chronic  . Aortic stenosis    mild AS 07/2016 echo  . Asthma    as a child  . CAD, multiple vessel 1985-2010   Most recent cath 01/01/09: 100% Occluded LAD after SP1, patent LIMA-distal LAD with apical 90% lesion.  Cx-OM1 widely patent stent into proximal bifurcating OM1 . Follow-on Cx => 70-80% -- non-amenable PCI. Widely patent 3 overlapping stents in midRCA with only 40% RPL stenosis; SVG- OM known to be occluded.  . Choledocholithiasis   . Cholelithiasis - with cholangitis    status post ERCP with removal of calculi and biliary stent placement.  . Chronic anemia    On iron supplement; history of positive guaiac - negative colonoscopy in 1996.; Thought to be related to hemorrhoids; status post hemorrhoidectomy  . Chronic back pain     multiple surgeries; C-spine and lumbar  . Diabetes mellitus    on oral  medication  . Diverticulitis of colon 1996  . Diverticulosis   . Dyslipidemia, goal LDL below 70   . Dyspnea   . Erectile dysfunction   . Exertional dyspnea    Chronic baseline SOB with ambulation  . GERD (gastroesophageal reflux disease)   . H/O unstable angina 06/2003, 12/2006, 05/2014   a) '04: Staged Taxus DES PCI to RCA and Cx-OM;;'08 - PCI to proximal ISR in RCA - Cypher DES; c) 05/2014: ISR in RCA & OM1, PCI with Promus P DES: RCA  2.75 x 12, OM1 3.0 mm x 8)  . H/O: pneumonia February 14  . Hemorrhoids   . Hiatal hernia   . History of nuclear stress test December 2013   LOW at Risk. Moderate region of mid to basal inferolateral scar without ischemia -- consistent with distal circumflex disease. Mild apical hypokinesis with an EF of 47%.  Marland Kitchen History of: ST elevation myocardial infarction (STEMI) involving left circumflex coronary artery with complication 0093   PTCA-circumflex; PCI in 1991  . Hypertension   . Irregular heart beat    PVCs and PACs, no arrhythmia recorded  . Moderate aortic stenosis by prior echocardiogram October 2016   Normal LV function - EF 55-60%.. Abnormal relaxation. Mild-moderate aortic stenosis (peak/mean gradient 18/10 mmH)  . Osteoarthritis of both knees    And back; multiple back surgeries, right knee arthroplasty and left knee  arthroscopic surgery x2  . Pneumonia 2016  . S/P CABG x 2 1997   LIMA-LAD, SVG-OM  . Sleep apnea    pt. states he was told to return for f/u, to be fitted for Cpap, but pt. reports that he didn't follow up-no cpap used    Medications:  Infusions:  . heparin      Assessment: 75 yom presented to the ED with CP. Found to have an elevated troponin at 0.05. To start IV heparin. H/H is slightly low at 11.3/35 and platelets are WNL. He is not on oral anticoagulation PTA.   Goal of Therapy:  Heparin level 0.3-0.7 units/ml Monitor platelets by anticoagulation protocol: Yes   Plan:  Heparin bolus 4000 units IV x 1  Heparin gtt  1300 units/hr Check a 6 hr heparin level Daily heparin level and CBC  Reeder Brisby, Rande Lawman 04/20/2017,3:06 PM

## 2017-04-20 NOTE — ED Notes (Signed)
Cardiology paged to inform them that he continues to have pain.

## 2017-04-20 NOTE — Interval H&P Note (Signed)
Cath Lab Visit (complete for each Cath Lab visit)  Clinical Evaluation Leading to the Procedure:   ACS: Yes.    Non-ACS:    Anginal Classification: CCS III  Anti-ischemic medical therapy: Maximal Therapy (2 or more classes of medications)  Non-Invasive Test Results: No non-invasive testing performed  Prior CABG: Previous CABG      History and Physical Interval Note:  04/20/2017 6:53 PM  Gerrianne Scale  has presented today for surgery, with the diagnosis of cad  The various methods of treatment have been discussed with the patient and family. After consideration of risks, benefits and other options for treatment, the patient has consented to  Procedure(s): Left Heart Cath and Cors/Grafts Angiography (N/A) as a surgical intervention .  The patient's history has been reviewed, patient examined, no change in status, stable for surgery.  I have reviewed the patient's chart and labs.  Questions were answered to the patient's satisfaction.     Quay Burow

## 2017-04-20 NOTE — ED Provider Notes (Signed)
[facility] DEPT Provider Note   CSN: [id number] Arrival date & time: 04/20/17  1057  By signing my name below, I, [name], attest that this documentation has been prepared under the direction and in the presence of Julian Manifold, MD. Electronically Signed: Mayer [name], Scribe. 04/20/17. 11:01 AM.  History   Chief Complaint Chief Complaint  Patient presents with  . Chest Pain   The history is provided by the patient. No language interpreter was used.    HPI Comments: [name] is a 81 y.o. male with h/o of CAD, AAA, triple bypass, and catheterizations who presents to the Emergency Department complaining of constant, gradually worsening CP that began 2 hours ago when he woke up. He states the pain originally was radiating down bilateral arms, but this has improved. Pt went to bed before with no symptoms, but woke up with CP, and his pain is persisting. Pt has bilateral swelling in legs and ankles, but this is baseline for him after having a recent knee surgery 4 months ago without complications. He took 324 mg of aspirin and NTG with mild relief (he states he took 2 "old" NTG with no relief, then was given 2 more in EMS with relief). Pt has had an MI before and states this feels similarly. He denies SOB, nausea, and diaphoresis. Dr. Ellyn Hack is his cardiologist. Pt reports having 9 stents placed in his heart.  Pt also complains of waxing/waning neck pain from a chronic issue after neck surgery that has been worsening recently. He takes tylenol with some relief.  Past Medical History:  Diagnosis Date  . AAA (abdominal aortic aneurysm) ([place]) 08/30/12   Doppler 08/30/12 had a small amount of growth. It was a 4.2 x 4.3 cm greater in size than the previous one noted at 4 x 3.8.  05-21-15 followed by CT abd-Dr. Kellie Simmering.  . Adenomatous colon polyp   . Ankle edema    Chronic  . Aortic stenosis    mild AS 07/2016 echo  . Asthma    as a child  . CAD, multiple vessel 1985-2010   Most recent cath 01/01/09: 100% Occluded LAD after SP1, patent LIMA-distal LAD with apical 90% lesion.  Cx-OM1 widely patent stent into proximal bifurcating OM1 . Follow-on Cx => 70-80% -- non-amenable PCI. Widely patent 3 overlapping stents in midRCA with only 40% RPL stenosis; SVG- OM known to be occluded.  . Choledocholithiasis   . Cholelithiasis - with cholangitis    status post ERCP with removal of calculi and biliary stent placement.  . Chronic anemia    On iron supplement; history of positive guaiac - negative colonoscopy in 1996.; Thought to be related to hemorrhoids; status post hemorrhoidectomy  . Chronic back pain     multiple surgeries; C-spine and lumbar  . Diabetes mellitus    on oral medication  . Diverticulitis of colon 1996  . Diverticulosis   . Dyslipidemia, goal LDL below 70   . Dyspnea   . Erectile dysfunction   . Exertional dyspnea    Chronic baseline SOB with ambulation  . GERD (gastroesophageal reflux disease)   . H/O unstable angina 06/2003, 12/2006, 05/2014   a) '04: Staged Taxus DES PCI to RCA and Cx-OM;;'08 - PCI to proximal ISR in RCA - Cypher DES; c) 05/2014: ISR in RCA & OM1, PCI with Promus P DES: RCA  2.75 x 12, OM1 3.0 mm x 8)  . H/O: pneumonia February 14  . Hemorrhoids   . Hiatal hernia   .  History of nuclear stress test December 2013   LOW at Risk. Moderate region of mid to basal inferolateral scar without ischemia -- consistent with distal circumflex disease. Mild apical hypokinesis with an EF of 47%.  Marland Kitchen History of: ST elevation myocardial infarction (STEMI) involving left circumflex coronary artery with complication 1438   PTCA-circumflex; PCI in 1991  . Hypertension   . Irregular heart beat    PVCs and PACs, no arrhythmia recorded  . Moderate aortic stenosis by prior echocardiogram October 2016   Normal LV function - EF 55-60%.. Abnormal relaxation. Mild-moderate aortic stenosis (peak/mean gradient 18/10 mmH)  . Osteoarthritis of both knees    And  back; multiple back surgeries, right knee arthroplasty and left knee arthroscopic surgery x2  . Pneumonia 2016  . S/P CABG x 2 1997   LIMA-LAD, SVG-OM  . Sleep apnea    pt. states he was told to return for f/u, to be fitted for Cpap, but pt. reports that he didn't follow up-no cpap used    Patient Active Problem List   Diagnosis Date Noted  . S/P total knee replacement 12/20/2016  . Endoleak post (EVAR) endovascular aneurysm repair ([place]) 03/16/2016  . Moderate aortic stenosis by prior echocardiogram 02/02/2016  . Statin intolerance 09/30/2015  . Calculus of bile duct with obstruction and without cholangitis or cholecystitis   . Obstruction of biliary stent   . History of unstable angina 01/08/2015  . Presence of drug coated stent in right coronary artery and circumflex coronary artery 01/08/2015  . Chronic renal insufficiency, stage III (moderate) 06/06/2014  . Coronary stent restenosis with uncertain cause: Required PCI for ISR of OM1 (Promus P 2.75 mm x 12 mm) & pRCA 05/17/2014  . Obesity (BMI 30-39.9) 06/28/2013  . Encounter for long-term (current) use of medications 06/28/2013  . ACE-inhibitor cough 04/16/2013  . Chronic diastolic CHF (congestive heart failure) ([place]) 11/24/2012  . Type II diabetes mellitus with complication -- CAD, AAA 03/04/2011    Class: Diagnosis of  . Hypertensive heart disease with chronic diastolic congestive heart failure ([place]) 03/04/2011  . Hyperlipidemia LDL goal <70; statin intolerant 03/04/2011    Class: Diagnosis of  . ED (erectile dysfunction) of organic origin 03/04/2011  . GERD (gastroesophageal reflux disease) 03/04/2011  . S/P CABG x 2: 1997. SVG-OM (known to be occluded), LIMA-LAD 06/28/1996  . History of ST elevation myocardial infarction (STEMI) of inferolateral wall (PTCA - 100% large lateral OM) 06/28/1996    Class: History of  . CAD S/P CABG '95- several PCis since, last 05/17/14 06/28/1994    Class: History of    Past Surgical History:   Procedure Laterality Date  . Abdominal and Lower Extremity Arterial Ultrasound  08/23/2012; 10/10/2013   Normal ABIs. Nonocclusive lower extremity disease. 4.2 cm x 4.3 cm infrarenal AAA;; 4.4 cm x 4.3 cm (essentially stable)   . ABDOMINAL AORTIC ENDOVASCULAR STENT GRAFT N/A 08/13/2015   Procedure: ABDOMINAL AORTIC ENDOVASCULAR STENT GRAFT;  Surgeon: Mal Misty, MD;  Location: [facility];  Service: Vascular;  Laterality: N/A;  . Anterior cervical plating  04/23/10   At C4-5 and a C6-7 utilizing two separate Biomet MaxAn plates.  . ANTERIOR LAT LUMBAR FUSION Left 11/22/2012   Procedure: ANTERIOR LATERAL LUMBAR FUSION 1 LEVEL;  Surgeon: Eustace Moore, MD;  Location: [name];  Service: Neurosurgery;  Laterality: Left;  Anterior Lateral Lumbar Fusion Lumbar Three-Four  . BACK SURGERY  1979 & x 10   pt. remarks, "I have had about  10 back surgeries"  . BILIARY STENT PLACEMENT N/A 07/03/2014   Procedure: BILIARY STENT PLACEMENT;  Surgeon: Inda Castle, MD;  Location: [facility];  Service: Endoscopy;  Laterality: N/A;  . CARDIAC CATHETERIZATION  September 2004   None Occluded vein graft to OM; diffuse RCA disease in the mid vessel, 80% circumflex-OM stenosis; follow on AV groove circumflex with sequential 90% stenoses and intervening saccular dilation   . CARDIAC CATHETERIZATION  March 2010   4 abnormal Myoview showing apical thinning (possibly due to apical LAD 95%) : 100% Occluded LAD after SV1, distal LAD grafted via LIMA -apical 95% . Cx -OM1 w/patent stent extending into OM 1 . Follow on Cx - 70-80% - non-amenable PCI. RCA widely patent 3 overlapping stents in mRCA w/less than 40% stenosisin RPL; SVG-OM known occluded   . CATARACT EXTRACTION    . Cervical arthrodesis  04/23/10   Anterior cervical arthrodesis, C4-5, C6-7 utilizing 7-mm PEEK interbody cage packed with local autograft & Antifuse putty at C4-5 & an 8-mm cage at C6-7.  Marland Kitchen CERVICAL DISCECTOMY  04/23/10   Decompressive anterior  carvical diskectomy. C4-5, C6-7  . CHOLECYSTECTOMY N/A 05/23/2015   Procedure: LAPAROSCOPIC CHOLECYSTECTOMY WITH INTRAOPERATIVE CHOLANGIOGRAM;  Surgeon: Alphonsa Overall, MD;  Location: [facility];  Service: General;  Laterality: N/A;  . [place]  . CORONARY ANGIOPLASTY  1985,1991,15   1985 lateral STEMI Circumflex PTCA  . CORONARY ARTERY BYPASS GRAFT  1990   LIMA-LAD, SVG-OM  . ERCP N/A 07/03/2014   Procedure: ENDOSCOPIC RETROGRADE CHOLANGIOPANCREATOGRAPHY (ERCP);  Surgeon: Inda Castle, MD;  Location: [facility];  Service: Endoscopy;  Laterality: N/A;  . ERCP N/A 07/05/2014   Procedure: ENDOSCOPIC RETROGRADE CHOLANGIOPANCREATOGRAPHY (ERCP);  Surgeon: Inda Castle, MD;  Location: [facility];  Service: Endoscopy;  Laterality: N/A;  . ERCP N/A 06/30/2015   Procedure: ENDOSCOPIC RETROGRADE CHOLANGIOPANCREATOGRAPHY (ERCP);  Surgeon: Ladene Artist, MD;  Location: [facility];  Service: Endoscopy;  Laterality: N/A;  . EYE SURGERY    . IR ANGIOGRAM EXTREMITY LEFT  01/18/2017  . IR ANGIOGRAM PELVIS SELECTIVE OR SUPRASELECTIVE  01/18/2017  . IR ANGIOGRAM SELECTIVE EACH ADDITIONAL VESSEL  01/18/2017  . IR AORTAGRAM ABDOMINAL SERIALOGRAM  01/18/2017  . IR EMBO ARTERIAL NOT HEMORR HEMANG INC GUIDE ROADMAPPING  01/18/2017  . IR RADIOLOGIST EVAL & MGMT  01/04/2017  . IR US GUIDE VASC ACCESS RIGHT  01/18/2017  . KNEE ARTHROSCOPY Left    x 2  . LEFT HEART CATHETERIZATION WITH CORONARY ANGIOGRAM N/A 05/15/2014   Procedure: LEFT HEART CATHETERIZATION WITH CORONARY ANGIOGRAM;  Surgeon: Sinclair Grooms, MD;  Location: [facility] CATH LAB;  Service: Cardiovascular;  Laterality: N/A;  . LEFT HEART CATHETERIZATION WITH CORONARY/GRAFT ANGIOGRAM N/A 06/28/2014   Procedure: LEFT HEART CATHETERIZATION WITH Beatrix Fetters;  Surgeon: Troy Sine, MD;  Location: [facility] CATH LAB;  Service: Cardiovascular;  Laterality: N/A;  . LUMBAR PERCUTANEOUS PEDICLE SCREW 1 LEVEL N/A 11/22/2012   Procedure: LUMBAR PERCUTANEOUS  PEDICLE SCREW 1 LEVEL;  Surgeon: Eustace Moore, MD;  Location: [facility] NEUR[facility] ORS;  Service: Neurosurgery;  Laterality: N/A;  Lumbar Three-Four Percutaneous Pedicle Screw, Lateral approach  . MINOR HEMORRHOIDECTOMY    . NM MYOVIEW LTD  December 2013   LOW RISK. Mmoderate region of mid to basal inferolateral scar without ischemia. Mild apical hypokinesis with an EF of 47%.  Marland Kitchen PERCUTANEOUS CORONARY STENT INTERVENTION (PCI-S)  September 2004   PCI - RCA 2 overlapping Taxus DES 2.75 mm x 32 mm and 2.75  mm x 12 mm (3.0 mm); PCI-Cx-OM1 - Taxus DES 3.0 mm x 20 mm (3.1 mm);   Marland Kitchen PERCUTANEOUS CORONARY STENT INTERVENTION (PCI-S)  March 2008   80% ISR in proximal Taxus stent in RCA -- covered proximally with Cypher DES 3.0 mm x 12 mm  . PERCUTANEOUS CORONARY STENT INTERVENTION (PCI-S) N/A 05/17/2014   Procedure: PERCUTANEOUS CORONARY STENT INTERVENTION (PCI-S);  Surgeon: Sinclair Grooms, MD;  Location: [facility] C[facility] LAB;  Service: Cardiovascular;  Laterality: N/A;  . PERIPHERAL VASCULAR CATHETERIZATION  11/03/2016   Procedure: Embolization;  Surgeon: Waynetta Sandy, MD;  Location: [facility] CV LAB;  Service: Cardiovascular;;  . POSTERIOR CERVICAL FUSION/FORAMINOTOMY N/A 05/02/2013   Procedure: POSTERIOR CERVICAL FUSION/FORAMINOTOMY CERVICAL SEVEN THORACIC-ONE;  Surgeon: Eustace Moore, MD;  Location: [facility];  Service: Neurosurgery;  Laterality: N/A;  POSTERIOR CERVICAL FUSION/FORAMINOTOMY CERVICAL SEVEN THORACIC-ONE  . SPHINCTEROTOMY N/A 06/30/2015   Procedure: SPHINCTEROTOMY;  Surgeon: Ladene Artist, MD;  Location: [facility];  Service: Endoscopy;  Laterality: N/A;  . TOTAL KNEE ARTHROPLASTY Right   . TOTAL KNEE ARTHROPLASTY Left 12/20/2016   Procedure: LEFT TOTAL KNEE ARTHROPLASTY;  Surgeon: Vickey Huger, MD;  Location: [facility];  Service: Orthopedics;  Laterality: Left;  . TRANSTHORACIC ECHOCARDIOGRAM  December 2013; October 2016   a. EF 55-60%. mod LA dilation. Aortic Sclerosis;; b. EF 55-60%, Gr 1 DD,  Mild-Mod AS (Peark - Mean Gradient 18-10 mmHg)  . TRANSTHORACIC ECHOCARDIOGRAM  07/2016   EF 45-50%. Inferolateral hypokinesis (correlates with scar noted on Myoview). GR 1 DD. Mild aortic stenosis. Mod LAE.  Marland Kitchen VISCERAL ANGIOGRAM  11/03/2016   Procedure: Visceral Angiogram;  Surgeon: Waynetta Sandy, MD;  Location: [facility] CV LAB;  Service: Cardiovascular;;       Home Medications    Prior to Admission medications   Medication Sig Start Date End Date Taking? Authorizing Provider  acetaminophen (TYLENOL) 500 MG tablet Take 1,000 mg by mouth every 6 (six) hours as needed for mild pain, moderate pain or headache. Reported on 03/16/2016    [provider]  albuterol (PROVENTIL HFA;VENTOLIN HFA) 108 (90 Base) MCG/ACT inhaler Inhale 2 puffs into the lungs every 6 (six) hours as needed for wheezing or shortness of breath. 01/12/17   Denita Lung, MD  amLODipine (NORVASC) 5 MG tablet TAKE 5 MG BY MOUTH DAILY....need appointment before further refills Patient taking differently: Take 5 mg by mouth daily.  09/07/16   Leonie Man, MD  aspirin EC 325 MG EC tablet Take 1 tablet (325 mg total) by mouth daily with breakfast. Patient not taking: Reported on 03/25/2017 12/21/16   Donia Ast, PA  Choline Fenofibrate (FENOFIBRIC ACID) 135 MG CPDR TAKE ONE CAPSULE BY MOUTH ONCE DAILY 03/08/17   Leonie Man, MD  clopidogrel (PLAVIX) 75 MG tablet TAKE ONE TABLET BY MOUTH ONCE DAILY 02/11/17   Leonie Man, MD  ferrous sulfate 325 (65 FE) MG tablet Take 325 mg by mouth daily with breakfast.    [provider]  glucose blood test strip Pt uses one touch verio test strips and tests twice daily. Patient taking differently: 1 each by Other route every other day.  12/18/15   Denita Lung, MD  losartan (COZAAR) 100 MG tablet TAKE ONE TABLET BY MOUTH ONCE DAILY 03/08/17   Leonie Man, MD  Multiple Vitamins-Minerals (MULTIVITAMIN WITH MINERALS) tablet Take 1 tablet by  mouth daily.     [provider]  pantoprazole (PROTONIX) 40 MG  tablet TAKE ONE TABLET BY MOUTH ONCE DAILY 02/11/17   Leonie Man, MD  pioglitazone-metformin (ACTOPLUS MET) 15-500 MG tablet Take 1 tablet by mouth daily. 03/28/17   Denita Lung, MD  sildenafil (REVATIO) 20 MG tablet TAKE 2-5 TABLETS BY MOUTH DAILY AS NEEDED Patient taking differently: TAKE 2-5 TABLETS BY MOUTH DAILY AS NEEDED FOR ERECTILE DYSFUNCTION 02/19/16   Denita Lung, MD    Family History Family History  Problem Relation Age of Onset  . Arthritis Mother   . Diabetes Father   . Heart disease Father   . Stroke Sister   . Hypertension Sister   . Heart disease Sister   . Diabetes Sister   . Breast cancer Sister   . Heart disease Brother   . Ulcers Brother   . Colon cancer Neg Hx     Social History Social History  Substance Use Topics  . Smoking status: Former Smoker    Types: Cigarettes    Quit date: 10/05/1983  . Smokeless tobacco: Never Used  . Alcohol use 0.6 oz/week    1 Glasses of wine per week     Comment:  4 oz. wine daily     Allergies   Oxycodone; Lisinopril; Statins; and Welchol [colesevelam hcl]   Review of Systems Review of Systems  Constitutional: Negative for diaphoresis.  Respiratory: Negative for shortness of breath.   Cardiovascular: Positive for chest pain and leg swelling.  Gastrointestinal: Negative for nausea.  Musculoskeletal: Positive for myalgias and neck pain.   A complete 10 system review of systems was obtained and all systems are negative except as noted in the HPI and PMH.    Physical Exam Updated Vital Signs BP 126/74   Pulse (!) 59   Temp 97.8 F (36.6 C) (Oral)   Resp 19   Ht '5\' 9"'  (1.753 m)   Wt 215 lb (97.5 kg)   SpO2 100%   BMI 31.75 kg/m   Physical Exam  Constitutional: He is oriented to person, place, and time. He appears well-developed and well-nourished. No distress.  HENT:  Head: Normocephalic and atraumatic.  Mouth/Throat:  Oropharynx is clear and moist. No oropharyngeal exudate.  Eyes: Pupils are equal, round, and reactive to light. Conjunctivae and EOM are normal.  Neck: Normal range of motion. Neck supple.  No meningismus.  Cardiovascular: Normal rate, regular rhythm, normal heart sounds and intact distal pulses.   No murmur heard. Mild lower extremity edema Sternotomy scar  Pulmonary/Chest: Effort normal and breath sounds normal. No respiratory distress.  Abdominal: Soft. There is no tenderness. There is no rebound and no guarding.  Musculoskeletal: Normal range of motion. He exhibits no edema or tenderness.  Neurological: He is alert and oriented to person, place, and time. No cranial nerve deficit. He exhibits normal muscle tone. Coordination normal.   5/5 strength throughout. CN 2-12 intact.Equal grip strength.   Skin: Skin is warm.  Psychiatric: He has a normal mood and affect. His behavior is normal.  Nursing note and vitals reviewed.    ED Treatments / Results  DIAGNOSTIC STUDIES: Oxygen Saturation is 100% on RA, normal by my interpretation.    COORDINATION OF CARE: 11:22 AM Discussed treatment plan with pt at bedside and pt agreed to plan.  Labs (all labs ordered are listed, but only abnormal results are displayed) Labs Reviewed  CBC WITH DIFFERENTIAL/PLATELET - Abnormal; Notable for the following:       Result Value   RBC 3.76 (*)    Hemoglobin  11.3 (*)    HCT 35.0 (*)    All other components within normal limits  BASIC METABOLIC PANEL - Abnormal; Notable for the following:    Creatinine, Ser 1.43 (*)    GFR calc non Af Amer 45 (*)    GFR calc Af Amer 52 (*)    All other components within normal limits  TROPONIN I - Abnormal; Notable for the following:    Troponin I 0.05 (*)    All other components within normal limits  CBC - Abnormal; Notable for the following:    RBC 3.56 (*)    Hemoglobin 10.5 (*)    HCT 32.9 (*)    Platelets 148 (*)    All other components within normal  limits  TROPONIN I - Abnormal; Notable for the following:    Troponin I 3.47 (*)    All other components within normal limits  TROPONIN I - Abnormal; Notable for the following:    Troponin I 7.87 (*)    All other components within normal limits  TROPONIN I - Abnormal; Notable for the following:    Troponin I 12.38 (*)    All other components within normal limits  BASIC METABOLIC PANEL - Abnormal; Notable for the following:    CO2 21 (*)    Creatinine, Ser 1.38 (*)    Calcium 8.7 (*)    GFR calc non Af Amer 47 (*)    GFR calc Af Amer 55 (*)    All other components within normal limits  GLUCOSE, CAPILLARY - Abnormal; Notable for the following:    Glucose-Capillary 62 (*)    All other components within normal limits  GLUCOSE, CAPILLARY - Abnormal; Notable for the following:    Glucose-Capillary 129 (*)    All other components within normal limits  GLUCOSE, CAPILLARY - Abnormal; Notable for the following:    Glucose-Capillary 125 (*)    All other components within normal limits  CBC - Abnormal; Notable for the following:    RBC 3.45 (*)    Hemoglobin 10.0 (*)    HCT 31.9 (*)    All other components within normal limits  TROPONIN I - Abnormal; Notable for the following:    Troponin I 8.77 (*)    All other components within normal limits  TROPONIN I - Abnormal; Notable for the following:    Troponin I 8.22 (*)    All other components within normal limits  GLUCOSE, CAPILLARY - Abnormal; Notable for the following:    Glucose-Capillary 103 (*)    All other components within normal limits  GLUCOSE, CAPILLARY - Abnormal; Notable for the following:    Glucose-Capillary 100 (*)    All other components within normal limits  PROTIME-INR  HEPARIN LEVEL (UNFRACTIONATED)  GLUCOSE, CAPILLARY  GLUCOSE, CAPILLARY  HEPARIN LEVEL (UNFRACTIONATED)  HEPARIN LEVEL (UNFRACTIONATED)  GLUCOSE, CAPILLARY  POCT ACTIVATED CLOTTING TIME    EKG  EKG Interpretation  Date/Time:  Wednesday April 20 2017 11:03:20 EDT Ventricular Rate:  62 PR Interval:    QRS Duration: 124 QT Interval:  420 QTC Calculation: 427 R Axis:   4 Text Interpretation:  Sinus rhythm Borderline prolonged PR interval Nonspecific intraventricular conduction delay Confirmed by [name] [phone]) on 04/20/2017 11:08:37 AM Also confirmed by [name] [phone]), editor Drema Pry [phone])  on 04/20/2017 11:33:52 AM       Radiology No results found.  Procedures Procedures (including critical care time)  Medications Ordered in ED Medications - No data to display  Initial Impression / Assessment and Plan / ED Course  I have reviewed the triage vital signs and the nursing notes.  Pertinent labs & imaging results that were available during my care of the patient were reviewed by me and considered in my medical decision making (see chart for details).     81yM with CP. Concerning for ACS. Cardiology consult.   Final Clinical Impressions(s) / ED Diagnoses   Final diagnoses:  Chest pain, unspecified type    New Prescriptions New Prescriptions   No medications on file    I personally preformed the services scribed in my presence. The recorded information has been reviewed is accurate. Julian Manifold, MD.     Julian Manifold, MD 04/30/17 [phone]

## 2017-04-20 NOTE — Telephone Encounter (Signed)
Phoned patient to schedule 3 mo follow up appointment with Dr Art Barbie Banner.  Patient's wife stated that patient had follow up CT Angio and appointment with Dr. Donzetta Matters on 03/25/2017.  States that they prefer to follow up with Dr Barbie Banner after next follow up imaging recommended by Dr Donzetta Matters for 6 months.  [place] Riki Rusk, RN 04/20/2017 8:14 AM

## 2017-04-20 NOTE — ED Triage Notes (Signed)
Pt brought in by EMS due to having chest pain. Per pt, pt was laying in bed when chest pain started. Pt denies SOB, n/v, dizziness, or diaphoresis. Pt has hx of CABG and multiple stents. Pt received 324mg  of aspirin and nitrox2.

## 2017-04-20 NOTE — Progress Notes (Signed)
Chaplain responded to STEMI and introduced self to patient and attendant wife, Julian Server "Dot", and daughter, Lynelle Smoke. Daughter indicates that her brother, patient's son is en route (lives here in [place]).  Patient indicates that he has been through this procedure many times.  He was responsive in conversation, but said he was feeling pain, and later indicated to medical staff a pain level of 6. Family was engaged and concerned, but also said they knew the routine.  Chaplain escorted family behind patient transport to [place] waiting area and gave orientation to location and process.  Offered support and continued care as needed.  Please call at any time.    Luana Shu [phone]    04/20/17 1900  Clinical Encounter Type  Visited With Patient and family together  Visit Type Initial  Referral From Care management  Consult/Referral To Chaplain  Stress Factors  Patient Stress Factors Health changes  Family Stress Factors Health changes

## 2017-04-20 NOTE — H&P (Signed)
History & Physical    Patient ID: [name] MRN: [id number], DOB/AGE: 05-Mar-1936   Admit date: 04/20/2017   Primary Physician: Denita Lung, MD Primary Cardiologist: Dr. Ellyn Hack   History of Present Illness    [name] is a 81 y.o. male with past medical history of CAD since 1985 with multiple stents and CABG 1997, AAA and repair 08/2015, and recent left total knee arthroplasty on 12/20/16.   The patient has long-standing history of CAD dating back to 26. Most recent cardiac evaluation and treatment included a staged PCI in August 2015 with PCI to in-stent restenosis in the RCA as well as OM 1, both areas treated with DES. He then had a cardiac catheterization shortly thereafter showing widely patent stents and grafts. He is intolerant of statins. Treated with fenofibrate.  Most recent echocardiogram on 07/19/16 showed mildly decreased LV function with EF 45-50%, hypokinesis of the inferior lateral myocardium, grade 1 diastolic dysfunction, calcified aortic valve with mild AI, moderate LAE, mild RAE, trace TR  Today the patient developed substernal chest pressure radiating to the bilateral arms at a level of 8-9/10. He had no associated shortness of breath, diaphoresis, lightheadedness or nausea. He tried sublingual nitroglycerin at home but it was old and had no effect. His wife called 63 and he was given sublingual nitroglycerin en route with improvement in his discomfort. He continues to have chest discomfort that is nonradiating at a level of 5-6/10. Prior to this episode he has had no recent exertional chest discomfort. He has chronic dyspnea on exertion that has been no worse of late. No orthopnea, PND or edema.  The patient does not smoke. He drinks one to 3 beers approximately 3 days per week and drinks about 4 ounces of wine to help his cholesterol about 3-4 days per week.   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 The patient reports  went back in 01/2017 for repeat procedure in radiology with successful embolization of a right lumbar artery as the source of the persistent type II endoleak. A follow-up CT angiography showed only a trace amount of contrast enhancement seen within the excluded aneurysmal sac anterior to the limbs. The aneurysm sac itself remains stable in diameter.   Pertinent labs include: Troponin 0.05, potassium 4.1, serum creatinine 1.43,  hemoglobin 11.3 EKG shows sinus rhythm at 62 bpm, nonspecific IVCD, no significant changes from previous Chest x-ray shows no acute cardiopulmonary disease  Past Medical History    Past Medical History:  Diagnosis Date  . AAA (abdominal aortic aneurysm) ([place]) 08/30/12   Doppler 08/30/12 had a small amount of growth. It was a 4.2 x 4.3 cm greater in size than the previous one noted at 4 x 3.8.  05-21-15 followed by CT abd-Dr. Kellie Simmering.  . Adenomatous colon polyp   . Ankle edema    Chronic  . Aortic stenosis    mild AS 07/2016 echo  . Asthma    as a child  . CAD, multiple vessel 1985-2010   Most recent cath 01/01/09: 100% Occluded LAD after SP1, patent LIMA-distal LAD with apical 90% lesion.  Cx-OM1 widely patent stent into proximal bifurcating OM1 . Follow-on Cx => 70-80% -- non-amenable PCI. Widely patent 3 overlapping stents in midRCA with only 40% RPL stenosis; SVG- OM known to be occluded.  . Choledocholithiasis   . Cholelithiasis - with cholangitis    status post ERCP with removal of calculi and biliary stent placement.  Marland Kitchen  Chronic anemia    On iron supplement; history of positive guaiac - negative colonoscopy in 1996.; Thought to be related to hemorrhoids; status post hemorrhoidectomy  . Chronic back pain     multiple surgeries; C-spine and lumbar  . Diabetes mellitus    on oral medication  . Diverticulitis of colon 1996  . Diverticulosis   . Dyslipidemia, goal LDL below 70   . Dyspnea   . Erectile dysfunction   . Exertional dyspnea    Chronic baseline  SOB with ambulation  . GERD (gastroesophageal reflux disease)   . H/O unstable angina 06/2003, 12/2006, 05/2014   a) '04: Staged Taxus DES PCI to RCA and Cx-OM;;'08 - PCI to proximal ISR in RCA - Cypher DES; c) 05/2014: ISR in RCA & OM1, PCI with Promus P DES: RCA  2.75 x 12, OM1 3.0 mm x 8)  . H/O: pneumonia February 14  . Hemorrhoids   . Hiatal hernia   . History of nuclear stress test December 2013   LOW at Risk. Moderate region of mid to basal inferolateral scar without ischemia -- consistent with distal circumflex disease. Mild apical hypokinesis with an EF of 47%.  Marland Kitchen History of: ST elevation myocardial infarction (STEMI) involving left circumflex coronary artery with complication 1478   PTCA-circumflex; PCI in 1991  . Hypertension   . Irregular heart beat    PVCs and PACs, no arrhythmia recorded  . Moderate aortic stenosis by prior echocardiogram October 2016   Normal LV function - EF 55-60%.. Abnormal relaxation. Mild-moderate aortic stenosis (peak/mean gradient 18/10 mmH)  . Osteoarthritis of both knees    And back; multiple back surgeries, right knee arthroplasty and left knee arthroscopic surgery x2  . Pneumonia 2016  . S/P CABG x 2 1997   LIMA-LAD, SVG-OM  . Sleep apnea    pt. states he was told to return for f/u, to be fitted for Cpap, but pt. reports that he didn't follow up-no cpap used    Past Surgical History:  Procedure Laterality Date  . Abdominal and Lower Extremity Arterial Ultrasound  08/23/2012; 10/10/2013   Normal ABIs. Nonocclusive lower extremity disease. 4.2 cm x 4.3 cm infrarenal AAA;; 4.4 cm x 4.3 cm (essentially stable)   . ABDOMINAL AORTIC ENDOVASCULAR STENT GRAFT N/A 08/13/2015   Procedure: ABDOMINAL AORTIC ENDOVASCULAR STENT GRAFT;  Surgeon: Mal Misty, MD;  Location: [facility];  Service: Vascular;  Laterality: N/A;  . Anterior cervical plating  04/23/10   At C4-5 and a C6-7 utilizing two separate Biomet MaxAn plates.  . ANTERIOR LAT LUMBAR FUSION Left  11/22/2012   Procedure: ANTERIOR LATERAL LUMBAR FUSION 1 LEVEL;  Surgeon: Eustace Moore, MD;  Location: [facility];  Service: Neurosurgery;  Laterality: Left;  Anterior Lateral Lumbar Fusion Lumbar Three-Four  . BACK SURGERY  1979 & x 10   pt. remarks, "I have had about 10 back surgeries"  . BILIARY STENT PLACEMENT N/A 07/03/2014   Procedure: BILIARY STENT PLACEMENT;  Surgeon: Inda Castle, MD;  Location: [facility];  Service: Endoscopy;  Laterality: N/A;  . CARDIAC CATHETERIZATION  September 2004   None Occluded vein graft to OM; diffuse RCA disease in the mid vessel, 80% circumflex-OM stenosis; follow on AV groove circumflex with sequential 90% stenoses and intervening saccular dilation   . CARDIAC CATHETERIZATION  March 2010   4 abnormal Myoview showing apical thinning (possibly due to apical LAD 95%) : 100% Occluded LAD after SV1, distal LAD grafted via LIMA -apical 95% .  Cx -OM1 w/patent stent extending into OM 1 . Follow on Cx - 70-80% - non-amenable PCI. RCA widely patent 3 overlapping stents in mRCA w/less than 40% stenosisin RPL; SVG-OM known occluded   . CATARACT EXTRACTION    . Cervical arthrodesis  04/23/10   Anterior cervical arthrodesis, C4-5, C6-7 utilizing 7-mm PEEK interbody cage packed with local autograft & Antifuse putty at C4-5 & an 8-mm cage at C6-7.  Marland Kitchen CERVICAL DISCECTOMY  04/23/10   Decompressive anterior carvical diskectomy. C4-5, C6-7  . CHOLECYSTECTOMY N/A 05/23/2015   Procedure: LAPAROSCOPIC CHOLECYSTECTOMY WITH INTRAOPERATIVE CHOLANGIOGRAM;  Surgeon: Alphonsa Overall, MD;  Location: [facility];  Service: General;  Laterality: N/A;  . [place]  . CORONARY ANGIOPLASTY  1985,1991,15   1985 lateral STEMI Circumflex PTCA  . CORONARY ARTERY BYPASS GRAFT  1990   LIMA-LAD, SVG-OM  . ERCP N/A 07/03/2014   Procedure: ENDOSCOPIC RETROGRADE CHOLANGIOPANCREATOGRAPHY (ERCP);  Surgeon: Inda Castle, MD;  Location: [facility];  Service: Endoscopy;  Laterality: N/A;  . ERCP  N/A 07/05/2014   Procedure: ENDOSCOPIC RETROGRADE CHOLANGIOPANCREATOGRAPHY (ERCP);  Surgeon: Inda Castle, MD;  Location: [facility];  Service: Endoscopy;  Laterality: N/A;  . ERCP N/A 06/30/2015   Procedure: ENDOSCOPIC RETROGRADE CHOLANGIOPANCREATOGRAPHY (ERCP);  Surgeon: Ladene Artist, MD;  Location: [facility];  Service: Endoscopy;  Laterality: N/A;  . EYE SURGERY    . IR ANGIOGRAM EXTREMITY LEFT  01/18/2017  . IR ANGIOGRAM PELVIS SELECTIVE OR SUPRASELECTIVE  01/18/2017  . IR ANGIOGRAM SELECTIVE EACH ADDITIONAL VESSEL  01/18/2017  . IR AORTAGRAM ABDOMINAL SERIALOGRAM  01/18/2017  . IR EMBO ARTERIAL NOT HEMORR HEMANG INC GUIDE ROADMAPPING  01/18/2017  . IR RADIOLOGIST EVAL & MGMT  01/04/2017  . IR US GUIDE VASC ACCESS RIGHT  01/18/2017  . KNEE ARTHROSCOPY Left    x 2  . LEFT HEART CATHETERIZATION WITH CORONARY ANGIOGRAM N/A 05/15/2014   Procedure: LEFT HEART CATHETERIZATION WITH CORONARY ANGIOGRAM;  Surgeon: Sinclair Grooms, MD;  Location: [facility];  Service: Cardiovascular;  Laterality: N/A;  . LEFT HEART CATHETERIZATION WITH CORONARY/GRAFT ANGIOGRAM N/A 06/28/2014   Procedure: LEFT HEART CATHETERIZATION WITH Beatrix Fetters;  Surgeon: Troy Sine, MD;  Location: [facility];  Service: Cardiovascular;  Laterality: N/A;  . LUMBAR PERCUTANEOUS PEDICLE SCREW 1 LEVEL N/A 11/22/2012   Procedure: LUMBAR PERCUTANEOUS PEDICLE SCREW 1 LEVEL;  Surgeon: Eustace Moore, MD;  Location: [facility];  Service: Neurosurgery;  Laterality: N/A;  Lumbar Three-Four Percutaneous Pedicle Screw, Lateral approach  . MINOR HEMORRHOIDECTOMY    . NM MYOVIEW LTD  December 2013   LOW RISK. Mmoderate region of mid to basal inferolateral scar without ischemia. Mild apical hypokinesis with an EF of 47%.  Marland Kitchen PERCUTANEOUS CORONARY STENT INTERVENTION (PCI-S)  September 2004   PCI - RCA 2 overlapping Taxus DES 2.75 mm x 32 mm and 2.75 mm x 12 mm (3.0 mm); PCI-Cx-OM1 - Taxus DES 3.0 mm x 20 mm (3.1 mm);   Marland Kitchen  PERCUTANEOUS CORONARY STENT INTERVENTION (PCI-S)  March 2008   80% ISR in proximal Taxus stent in RCA -- covered proximally with Cypher DES 3.0 mm x 12 mm  . PERCUTANEOUS CORONARY STENT INTERVENTION (PCI-S) N/A 05/17/2014   Procedure: PERCUTANEOUS CORONARY STENT INTERVENTION (PCI-S);  Surgeon: Sinclair Grooms, MD;  Location: [facility] C[facility];  Service: Cardiovascular;  Laterality: N/A;  . PERIPHERAL VASCULAR CATHETERIZATION  11/03/2016   Procedure: Embolization;  Surgeon: Waynetta Sandy, MD;  Location: [facility];  Service: Cardiovascular;;  . POSTERIOR CERVICAL FUSION/FORAMINOTOMY N/A 05/02/2013   Procedure: POSTERIOR CERVICAL FUSION/FORAMINOTOMY CERVICAL SEVEN THORACIC-ONE;  Surgeon: Eustace Moore, MD;  Location: [facility];  Service: Neurosurgery;  Laterality: N/A;  POSTERIOR CERVICAL FUSION/FORAMINOTOMY CERVICAL SEVEN THORACIC-ONE  . SPHINCTEROTOMY N/A 06/30/2015   Procedure: SPHINCTEROTOMY;  Surgeon: Ladene Artist, MD;  Location: [facility];  Service: Endoscopy;  Laterality: N/A;  . TOTAL KNEE ARTHROPLASTY Right   . TOTAL KNEE ARTHROPLASTY Left 12/20/2016   Procedure: LEFT TOTAL KNEE ARTHROPLASTY;  Surgeon: Vickey Huger, MD;  Location: [facility];  Service: Orthopedics;  Laterality: Left;  . TRANSTHORACIC ECHOCARDIOGRAM  December 2013; October 2016   a. EF 55-60%. mod LA dilation. Aortic Sclerosis;; b. EF 55-60%, Gr 1 DD, Mild-Mod AS ([place] - Mean Gradient 18-10 mmHg)  . TRANSTHORACIC ECHOCARDIOGRAM  07/2016   EF 45-50%. Inferolateral hypokinesis (correlates with scar noted on Myoview). GR 1 DD. Mild aortic stenosis. Mod LAE.  Marland Kitchen VISCERAL ANGIOGRAM  11/03/2016   Procedure: Visceral Angiogram;  Surgeon: Waynetta Sandy, MD;  Location: [facility];  Service: Cardiovascular;;     Allergies  Allergies  Allergen Reactions  . Oxycodone Shortness Of Breath and Cough  . Lisinopril Cough  . Statins Other (See Comments)    MYALGIAS   . Welchol [Colesevelam Hcl]  Itching     Home Medications    Prior to Admission medications   Medication Sig Start Date End Date Taking? Authorizing Provider  acetaminophen (TYLENOL) 500 MG tablet Take 1,000 mg by mouth every 6 (six) hours as needed for mild pain, moderate pain or headache. Reported on 03/16/2016    [provider]  albuterol (PROVENTIL HFA;VENTOLIN HFA) 108 (90 Base) MCG/ACT inhaler Inhale 2 puffs into the lungs every 6 (six) hours as needed for wheezing or shortness of breath. 01/12/17   Denita Lung, MD  amLODipine (NORVASC) 5 MG tablet TAKE 5 MG BY MOUTH DAILY....need appointment before further refills Patient taking differently: Take 5 mg by mouth daily.  09/07/16   Leonie Man, MD  aspirin EC 325 MG EC tablet Take 1 tablet (325 mg total) by mouth daily with breakfast. Patient not taking: Reported on 03/25/2017 12/21/16   Donia Ast, PA  Choline Fenofibrate (FENOFIBRIC ACID) 135 MG CPDR TAKE ONE CAPSULE BY MOUTH ONCE DAILY 03/08/17   Leonie Man, MD  clopidogrel (PLAVIX) 75 MG tablet TAKE ONE TABLET BY MOUTH ONCE DAILY 02/11/17   Leonie Man, MD  ferrous sulfate 325 (65 FE) MG tablet Take 325 mg by mouth daily with breakfast.    [provider]  glucose blood test strip Pt uses one touch verio test strips and tests twice daily. Patient taking differently: 1 each by Other route every other day.  12/18/15   Denita Lung, MD  losartan (COZAAR) 100 MG tablet TAKE ONE TABLET BY MOUTH ONCE DAILY 03/08/17   Leonie Man, MD  Multiple Vitamins-Minerals (MULTIVITAMIN WITH MINERALS) tablet Take 1 tablet by mouth daily.     [provider]  pantoprazole (PROTONIX) 40 MG tablet TAKE ONE TABLET BY MOUTH ONCE DAILY 02/11/17   Leonie Man, MD  pioglitazone-metformin (ACTOPLUS MET) 15-500 MG tablet Take 1 tablet by mouth daily. 03/28/17   Denita Lung, MD  sildenafil (REVATIO) 20 MG tablet TAKE 2-5 TABLETS BY MOUTH DAILY AS NEEDED Patient taking differently:  TAKE 2-5 TABLETS BY MOUTH DAILY AS NEEDED FOR ERECTILE DYSFUNCTION 02/19/16   Denita Lung,  MD    Family History    Family History  Problem Relation Age of Onset  . Arthritis Mother   . Diabetes Father   . Heart disease Father   . Stroke Sister   . Hypertension Sister   . Heart disease Sister   . Diabetes Sister   . Breast cancer Sister   . Heart disease Brother   . Ulcers Brother   . Colon cancer Neg Hx      Social History    Social History   Social History  . Marital status: Married    Spouse name: N/A  . Number of children: N/A  . Years of education: N/A   Occupational History  . Not on file.   Social History Main Topics  . Smoking status: Former Smoker    Types: Cigarettes    Quit date: 10/05/1983  . Smokeless tobacco: Never Used  . Alcohol use 0.6 oz/week    1 Glasses of wine per week     Comment:  4 oz. wine daily  . Drug use: No  . Sexual activity: Not Currently   Other Topics Concern  . Not on file   Social History Narrative   He is married, father of two, grandfather to 1, great grandfather to two.    Not really getting much exercise now, do to his significant back and hip pain.    He does not smoke and only has an alcoholic beverage.      Review of Systems    General:  No chills, fever, night sweats or weight changes.  Cardiovascular:  No chest pain, dyspnea on exertion, edema, orthopnea, palpitations, paroxysmal nocturnal dyspnea. Dermatological: No rash, lesions/masses Respiratory: No cough, dyspnea Urologic: No hematuria, dysuria Abdominal:   No nausea, vomiting, diarrhea, bright red blood per rectum, melena, or hematemesis Neurologic:  No visual changes, wkns, changes in mental status. All other systems reviewed and are otherwise negative except as noted above.  Physical Exam    Blood pressure (!) 143/76, pulse (!) 54, temperature 97.8 F (36.6 C), temperature source Oral, resp. rate 19, height '5\' 9"'  (1.753 m), weight 215 lb (97.5  kg), SpO2 95 %.  General: Well developed, well nourished,male in no acute distress. Head: Normocephalic, atraumatic, sclera non-icteric, no xanthomas, nares are without discharge.  Neck: No carotid bruits. JVD not elevated.  Lungs: Respirations regular and unlabored, without wheezes or rales.  Heart: Regular rate and rhythm. No S3 or S4.  0-6/2 systolic murmur best heard near LUSB. Abdomen: Soft, non-tender, non-distended with normoactive bowel sounds. No hepatomegaly. No rebound/guarding. No obvious abdominal masses. Msk:  Strength and tone appear normal for age. No joint deformities or effusions. Extremities: No clubbing or cyanosis. No edema.  Distal pedal pulses are 2+ bilaterally. Neuro: Alert and oriented X 3. Moves all extremities spontaneously. No focal deficits noted. Psych:  Responds to questions appropriately with a normal affect. Skin: No rashes or lesions noted  Labs    Troponin (Point of Care Test) No results for input(s): TROPIPOC in the last 72 hours.  Recent Labs  04/20/17 1147  TROPONINI 0.05*   Lab Results  Component Value Date   WBC 4.3 04/20/2017   HGB 11.3 (L) 04/20/2017   HCT 35.0 (L) 04/20/2017   MCV 93.1 04/20/2017   PLT 171 04/20/2017    Recent Labs Lab 04/20/17 1147  NA 138  K 4.1  CL 110  CO2 22  BUN 19  CREATININE 1.43*  CALCIUM 9.0  GLUCOSE 99  Lab Results  Component Value Date   CHOL 172 11/09/2016   HDL 49 11/09/2016   LDLCALC 106 (H) 11/09/2016   TRIG 85 11/09/2016   Lab Results  Component Value Date   DDIMER 1.12 (H) 11/24/2012    No results found for: BNP Pro B Natriuretic peptide (BNP)  Date/Time Value Ref Range Status  07/04/2014 01:00 PM 397.2 0 - 450 pg/mL Final  06/27/2014 07:54 PM 333.0 0 - 450 pg/mL Final   No results for input(s): INR in the last 72 hours.    Radiology Studies    Dg Chest 2 View  Result Date: 04/20/2017 CLINICAL DATA:  Chest pain EXAM: CHEST  2 VIEW COMPARISON:  12/01/2016 FINDINGS:  Cardiac shadow is stable. Postsurgical changes are again seen. The lungs are well aerated bilaterally. No focal infiltrate or sizable effusion is noted. Degenerative changes of the thoracic spine are seen. IMPRESSION: No active cardiopulmonary disease. Electronically Signed   By: Inez Catalina M.D.   On: 04/20/2017 11:53    EKG & Cardiac Imaging    EKG: Sinus rhythm at 62 bpm with nonspecific IVCD, no significant changes from previous   ECHOCARDIOGRAM: Study Conclusions  (07/19/16)  - Left ventricle: The cavity size was mildly dilated. Wall   thickness was normal. Systolic function was mildly reduced. The   estimated ejection fraction was in the range of 45% to 50%. There   is hypokinesis of the inferolateral myocardium. Doppler   parameters are consistent with abnormal left ventricular   relaxation (grade 1 diastolic dysfunction). - Aortic valve: There was mild stenosis. Valve area (VTI): 1.58   cm^2. Valve area (Vmax): 1.61 cm^2. Valve area (Vmean): 1.49   cm^2. - Mitral valve: Calcified annulus. - Left atrium: The atrium was moderately dilated. - Right atrium: The atrium was mildly dilated.  Impressions:  - Hypokinesis of the basal inferolateral wall with mildly reduced   LV systolic function; grade 1 diastolic dysfunction; calcified   aortic valve with mild AS; moderate LAE; mild RAE; trace TR.  Cardiac catheterization from 06/28/2014  Mild LV dysfunction with a moderate region of the inferior hypocontractility and ejection fraction of 45%.  Native coronary artery disease with total occlusion of the LAD after the septal perforating and diagonal vessel; widely patent stent extending from the circumflex proximal to the AV groove branch into the midportion of the OM vessel, and evidence for 80% ostial stenosis in the AV groove circumflex with diffusely ectatic segment.  Beyond this stenosis and a small caliber AV groove vessel which is old; widely patent stents in the RCA  extending proximally to the acute margin, without restenosis, and mild luminal narrowing of 20% in the continuation branch of the distal RCA after the PDA takeoff.  RECOMMENDATION:  Medical therapy.  The recent sites of PCI done in August 2015 including the proximal RCA and obtuse marginal branch are widely patent without restenosis.  There is no change in the previous diffusely diseased AV groove circumflex, which is a small-caliber vessel with a prominent region of ectasia.   Assessment & Plan    Active Problems:   * No active hospital problems. *  CAD -Long-standing since 1985 with CABG in 1995 and several PCI's since. Most recent PCI was in 2015 and still on Plavix, no aspirin (asa 325 mg is on med list). He is on calcium channel blocker and ARB but no beta blocker due to history of bradycardia and his COPD. Not on statin as he has significant myalgias.  Treated with fenofibrate and Zetia -Here today with new onset central chest pressure radiating to bilateral arms with no associated symptoms. -EKG is without ischemic changes -First troponin is mildly elevated at 0.05 -has normal potassium. Serum creatinine elevated at 1.43 which is about his baseline -Chest pain continues but is much improved. We'll admit to step down, cycle troponins, order IV heparin, recheck EKG,  nitroglycerin drip. And will discuss need for further workup, probably cardiac catheterization, with Dr. Sallyanne Kuster. Plan for tentative cath tomorrow at 10:30 with Dr. Saunders Revel.   Chronic systolic and diastolic dysfunction -mildly reduced LV systolic function; grade 1 diastolic dysfunction;  EF 45-50% -Medical management includes ARB. No beta blocker due to history of bradycardia -Patient appears euvolemic  Aortic stenosis -Mild to moderate. Most recent echo did not suggest it to be as narrow as previously noted per Dr. Allison Quarry note.   Hypertension -Home medicines include amlodipine 5 mg, losartan 100 mg daily. His blood  pressures fairly well-controlled.  Hyperlipidemia -Patient is intolerant to statins due to significant myalgias. He treated with fenofibrate. Zetia was refused due to cost. Patient was to be referred to lipid clinic for possible PCS canine inhibitor therapy. I do not see a note from the clinic. -Lipid panel on 11/09/16 showed total cholesterol 172, LDL 106, HDL 49, triglycerides 85 -Continue current therapy  Diabetes type 2  -Last A1c was 5.8 on 12/09/16 -Old oral meds while admitted. We'll order before meals and at bedtime blood sugars with sliding scale insulin, mild scale.  SignedDaune Perch, NP-C 04/20/2017, 1:56 PM Pager: [phone]  I have seen and examined the patient along with Daune Perch, NP-C.  I have reviewed the chart, notes and new data.  I agree with NP's note.  Key new complaints: angina at rest, abrupt onset, similar to past events; still with mild-moderate discomfort Key examination changes: no arrhythmia and no overt hypervolemia Key new findings / data: minimal troponin increase. Creatinine close to recent baseline. ECG with probable old inferoposterior MI, but no acute repol changes.   PLAN: Recommend IV heparin and coronary angio. Ideally will "cool down" with IV NTG overnight to allow IV fluids for nephroprotection, but if he continues to have significant angina will proceed with urgent cath today. This procedure has been fully reviewed with the patient and written informed consent has been obtained.   Sanda Klein, MD, [place] [phone] 04/20/2017, 4:11 PM

## 2017-04-20 NOTE — Progress Notes (Signed)
ANTICOAGULATION CONSULT NOTE - Follow Up Consult  Pharmacy Consult for heparin Indication: chest pain/ACS  Allergies  Allergen Reactions  . Oxycodone Shortness Of Breath and Cough  . Lisinopril Cough  . Statins Other (See Comments)    MYALGIAS   . Welchol [Colesevelam Hcl] Itching    Patient Measurements: Height: 5\' 9"  (175.3 cm) Weight: 215 lb (97.5 kg) IBW/kg (Calculated) : 70.7 Heparin Dosing Weight: 91.1kg  Vital Signs: Temp: 97.6 F (36.4 C) (07/18 2008) Temp Source: Oral (07/18 2008) BP: 148/70 (07/18 2008) Pulse Rate: 60 (07/18 2008)  Labs:  Recent Labs  04/20/17 1147 04/20/17 1801  HGB 11.3*  --   HCT 35.0*  --   PLT 171  --   CREATININE 1.43*  --   TROPONINI 0.05* 3.47*    Estimated Creatinine Clearance: 48.2 mL/min (A) (by C-G formula based on SCr of 1.43 mg/dL (H)).   Medical History: Past Medical History:  Diagnosis Date  . AAA (abdominal aortic aneurysm) ([place]) 08/30/12   Doppler 08/30/12 had a small amount of growth. It was a 4.2 x 4.3 cm greater in size than the previous one noted at 4 x 3.8.  05-21-15 followed by CT abd-Dr. Kellie Simmering.  . Adenomatous colon polyp   . Ankle edema    Chronic  . Aortic stenosis    mild AS 07/2016 echo  . Asthma    as a child  . CAD, multiple vessel 1985-2010   Most recent cath 01/01/09: 100% Occluded LAD after SP1, patent LIMA-distal LAD with apical 90% lesion.  Cx-OM1 widely patent stent into proximal bifurcating OM1 . Follow-on Cx => 70-80% -- non-amenable PCI. Widely patent 3 overlapping stents in midRCA with only 40% RPL stenosis; SVG- OM known to be occluded.  . Choledocholithiasis   . Cholelithiasis - with cholangitis    status post ERCP with removal of calculi and biliary stent placement.  . Chronic anemia    On iron supplement; history of positive guaiac - negative colonoscopy in 1996.; Thought to be related to hemorrhoids; status post hemorrhoidectomy  . Chronic back pain     multiple surgeries; C-spine  and lumbar  . Diabetes mellitus    on oral medication  . Diverticulitis of colon 1996  . Diverticulosis   . Dyslipidemia, goal LDL below 70   . Dyspnea   . Erectile dysfunction   . Exertional dyspnea    Chronic baseline SOB with ambulation  . GERD (gastroesophageal reflux disease)   . H/O unstable angina 06/2003, 12/2006, 05/2014   a) '04: Staged Taxus DES PCI to RCA and Cx-OM;;'08 - PCI to proximal ISR in RCA - Cypher DES; c) 05/2014: ISR in RCA & OM1, PCI with Promus P DES: RCA  2.75 x 12, OM1 3.0 mm x 8)  . H/O: pneumonia February 14  . Hemorrhoids   . Hiatal hernia   . History of nuclear stress test December 2013   LOW at Risk. Moderate region of mid to basal inferolateral scar without ischemia -- consistent with distal circumflex disease. Mild apical hypokinesis with an EF of 47%.  Marland Kitchen History of: ST elevation myocardial infarction (STEMI) involving left circumflex coronary artery with complication 9417   PTCA-circumflex; PCI in 1991  . Hypertension   . Irregular heart beat    PVCs and PACs, no arrhythmia recorded  . Moderate aortic stenosis by prior echocardiogram October 2016   Normal LV function - EF 55-60%.. Abnormal relaxation. Mild-moderate aortic stenosis (peak/mean gradient 18/10 mmH)  . Osteoarthritis of  both knees    And back; multiple back surgeries, right knee arthroplasty and left knee arthroscopic surgery x2  . Pneumonia 2016  . S/P CABG x 2 1997   LIMA-LAD, SVG-OM  . Sleep apnea    pt. states he was told to return for f/u, to be fitted for Cpap, but pt. reports that he didn't follow up-no cpap used    Medications:  Infusions:  . sodium chloride 75 mL/hr at 04/20/17 2030  . sodium chloride    . [START ON 04/21/2017] heparin    . nitroGLYCERIN 15 mcg/min (04/20/17 2030)    Assessment: 6 yom presented to the ED with CP. Found to have an elevated troponin at 0.05. Started IV heparin then stopped for cath lab - no intervention - restart heparin drip 6 hr after  sheath pulled out 2000 restart drip rate 1300 uts/hr.    H/H is slightly low at 11.3/35 and platelets are WNL. He is not on oral anticoagulation PTA.   Goal of Therapy:  Heparin level 0.3-0.7 units/ml Monitor platelets by anticoagulation protocol: Yes   Plan:  Heparin gtt 1300 units/hr Check a 6 hr heparin level Daily heparin level and CBC  Bonnita Nasuti Pharm.D. CPP, BCPS Clinical Pharmacist [phone] 04/20/2017 9:04 PM

## 2017-04-21 ENCOUNTER — Encounter (HOSPITAL_COMMUNITY): Payer: Self-pay | Admitting: Cardiovascular Disease

## 2017-04-21 DIAGNOSIS — I5042 Chronic combined systolic (congestive) and diastolic (congestive) heart failure: Secondary | ICD-10-CM | POA: Diagnosis not present

## 2017-04-21 DIAGNOSIS — Z951 Presence of aortocoronary bypass graft: Secondary | ICD-10-CM | POA: Diagnosis not present

## 2017-04-21 DIAGNOSIS — K219 Gastro-esophageal reflux disease without esophagitis: Secondary | ICD-10-CM | POA: Diagnosis not present

## 2017-04-21 DIAGNOSIS — I214 Non-ST elevation (NSTEMI) myocardial infarction: Secondary | ICD-10-CM | POA: Diagnosis not present

## 2017-04-21 DIAGNOSIS — Z96652 Presence of left artificial knee joint: Secondary | ICD-10-CM | POA: Diagnosis present

## 2017-04-21 DIAGNOSIS — Z888 Allergy status to other drugs, medicaments and biological substances status: Secondary | ICD-10-CM | POA: Diagnosis not present

## 2017-04-21 DIAGNOSIS — D649 Anemia, unspecified: Secondary | ICD-10-CM | POA: Diagnosis not present

## 2017-04-21 DIAGNOSIS — M25511 Pain in right shoulder: Secondary | ICD-10-CM | POA: Diagnosis present

## 2017-04-21 DIAGNOSIS — I440 Atrioventricular block, first degree: Secondary | ICD-10-CM | POA: Diagnosis not present

## 2017-04-21 DIAGNOSIS — Z79899 Other long term (current) drug therapy: Secondary | ICD-10-CM | POA: Diagnosis not present

## 2017-04-21 DIAGNOSIS — M170 Bilateral primary osteoarthritis of knee: Secondary | ICD-10-CM | POA: Diagnosis present

## 2017-04-21 DIAGNOSIS — G8929 Other chronic pain: Secondary | ICD-10-CM | POA: Diagnosis present

## 2017-04-21 DIAGNOSIS — Z885 Allergy status to narcotic agent status: Secondary | ICD-10-CM | POA: Diagnosis not present

## 2017-04-21 DIAGNOSIS — E663 Overweight: Secondary | ICD-10-CM | POA: Diagnosis present

## 2017-04-21 DIAGNOSIS — E0859 Diabetes mellitus due to underlying condition with other circulatory complications: Secondary | ICD-10-CM

## 2017-04-21 DIAGNOSIS — E785 Hyperlipidemia, unspecified: Secondary | ICD-10-CM | POA: Diagnosis not present

## 2017-04-21 DIAGNOSIS — I350 Nonrheumatic aortic (valve) stenosis: Secondary | ICD-10-CM | POA: Diagnosis not present

## 2017-04-21 DIAGNOSIS — I10 Essential (primary) hypertension: Secondary | ICD-10-CM

## 2017-04-21 DIAGNOSIS — J449 Chronic obstructive pulmonary disease, unspecified: Secondary | ICD-10-CM | POA: Diagnosis not present

## 2017-04-21 DIAGNOSIS — I25110 Atherosclerotic heart disease of native coronary artery with unstable angina pectoris: Secondary | ICD-10-CM | POA: Diagnosis not present

## 2017-04-21 DIAGNOSIS — E119 Type 2 diabetes mellitus without complications: Secondary | ICD-10-CM | POA: Diagnosis not present

## 2017-04-21 DIAGNOSIS — E1159 Type 2 diabetes mellitus with other circulatory complications: Secondary | ICD-10-CM | POA: Diagnosis not present

## 2017-04-21 DIAGNOSIS — M79601 Pain in right arm: Secondary | ICD-10-CM | POA: Diagnosis present

## 2017-04-21 DIAGNOSIS — E7800 Pure hypercholesterolemia, unspecified: Secondary | ICD-10-CM | POA: Diagnosis not present

## 2017-04-21 DIAGNOSIS — G4730 Sleep apnea, unspecified: Secondary | ICD-10-CM | POA: Diagnosis not present

## 2017-04-21 DIAGNOSIS — M549 Dorsalgia, unspecified: Secondary | ICD-10-CM | POA: Diagnosis present

## 2017-04-21 DIAGNOSIS — I119 Hypertensive heart disease without heart failure: Secondary | ICD-10-CM | POA: Diagnosis not present

## 2017-04-21 DIAGNOSIS — Z87891 Personal history of nicotine dependence: Secondary | ICD-10-CM | POA: Diagnosis not present

## 2017-04-21 DIAGNOSIS — M79602 Pain in left arm: Secondary | ICD-10-CM | POA: Diagnosis present

## 2017-04-21 LAB — BASIC METABOLIC PANEL
Anion gap: 6 (ref 5–15)
BUN: 18 mg/dL (ref 6–20)
CO2: 21 mmol/L — AB (ref 22–32)
Calcium: 8.7 mg/dL — ABNORMAL LOW (ref 8.9–10.3)
Chloride: 110 mmol/L (ref 101–111)
Creatinine, Ser: 1.38 mg/dL — ABNORMAL HIGH (ref 0.61–1.24)
GFR calc Af Amer: 55 mL/min — ABNORMAL LOW (ref >60)
GFR, EST NON AFRICAN AMERICAN: 47 mL/min — AB (ref >60)
GLUCOSE: 83 mg/dL (ref 65–99)
POTASSIUM: 3.9 mmol/L (ref 3.5–5.1)
Sodium: 137 mmol/L (ref 135–145)

## 2017-04-21 LAB — GLUCOSE, CAPILLARY
GLUCOSE-CAPILLARY: 77 mg/dL (ref 65–99)
GLUCOSE-CAPILLARY: 129 mg/dL — AB (ref 65–99)
GLUCOSE-CAPILLARY: 125 mg/dL — AB (ref 65–99)
Glucose-Capillary: 103 mg/dL — ABNORMAL HIGH (ref 65–99)

## 2017-04-21 LAB — CBC
HEMATOCRIT: 32.9 % — AB (ref 39.0–52.0)
HEMOGLOBIN: 10.5 g/dL — AB (ref 13.0–17.0)
MCH: 29.5 pg (ref 26.0–34.0)
MCHC: 31.9 g/dL (ref 30.0–36.0)
MCV: 92.4 fL (ref 78.0–100.0)
Platelets: 148 10*3/uL — ABNORMAL LOW (ref 150–400)
RBC: 3.56 MIL/uL — ABNORMAL LOW (ref 4.22–5.81)
RDW: 15.1 % (ref 11.5–15.5)
WBC: 4.6 10*3/uL (ref 4.0–10.5)

## 2017-04-21 LAB — POCT ACTIVATED CLOTTING TIME: ACTIVATED CLOTTING TIME: 142 s

## 2017-04-21 LAB — TROPONIN I
Troponin I: 7.87 ng/mL (ref <0.03)
Troponin I: 12.38 ng/mL (ref <0.03)
Troponin I: 8.77 ng/mL (ref <0.03)

## 2017-04-21 LAB — HEPARIN LEVEL (UNFRACTIONATED)
Heparin Unfractionated: 0.38 IU/mL (ref 0.30–0.70)
Heparin Unfractionated: 0.51 IU/mL (ref 0.30–0.70)

## 2017-04-21 MED ORDER — ALPRAZOLAM 0.25 MG PO TABS
0.2500 mg | ORAL_TABLET | Freq: Every evening | ORAL | Status: DC | PRN
  Administered 2017-04-21: 22:00:00 0.25 mg via ORAL
  Filled 2017-04-21: qty 1

## 2017-04-21 MED ORDER — TRAMADOL HCL 50 MG PO TABS
50.0000 mg | ORAL_TABLET | Freq: Four times a day (QID) | ORAL | Status: DC | PRN
  Administered 2017-04-21: 03:00:00 50 mg via ORAL
  Filled 2017-04-21: qty 1

## 2017-04-21 MED ORDER — RANOLAZINE ER 500 MG PO TB12: 500.0000 mg | ORAL_TABLET | Freq: Two times a day (BID) | ORAL | 5 refills | Status: DC

## 2017-04-21 MED ORDER — RANOLAZINE ER 500 MG PO TB12
500.0000 mg | ORAL_TABLET | Freq: Two times a day (BID) | ORAL | Status: DC
  Administered 2017-04-21 – 2017-04-22 (×3): 500 mg via ORAL
  Filled 2017-04-21 (×3): qty 1

## 2017-04-21 NOTE — Progress Notes (Signed)
ANTICOAGULATION CONSULT NOTE - Follow Up Consult  Pharmacy Consult for heparin Indication: chest pain/ACS  Allergies  Allergen Reactions  . Oxycodone Shortness Of Breath and Cough  . Lisinopril Cough  . Statins Other (See Comments)    MYALGIAS   . Welchol [Colesevelam Hcl] Itching    Patient Measurements: Height: 5\' 9"  (175.3 cm) Weight: 214 lb 15.2 oz (97.5 kg) IBW/kg (Calculated) : 70.7 Heparin Dosing Weight: 91.1kg  Vital Signs: Temp: 97.5 F (36.4 C) (07/19 0745) Temp Source: Oral (07/19 0745) BP: 118/53 (07/19 0900) Pulse Rate: 55 (07/19 0600)  Labs:  Recent Labs  04/20/17 1147 04/20/17 1801 04/20/17 2047 04/21/17 0044 04/21/17 0604 04/21/17 0942  HGB 11.3*  --   --   --  10.5*  --   HCT 35.0*  --   --   --  32.9*  --   PLT 171  --   --   --  148*  --   LABPROT  --   --  13.1  --   --   --   INR  --   --  0.99  --   --   --   HEPARINUNFRC  --   --   --   --   --  0.38  CREATININE 1.43*  --   --   --  1.38*  --   TROPONINI 0.05* 3.47*  --  7.87* 12.38*  --     Estimated Creatinine Clearance: 50 mL/min (A) (by C-G formula based on SCr of 1.38 mg/dL (H)).   Medical History: Past Medical History:  Diagnosis Date  . AAA (abdominal aortic aneurysm) ([place]) 08/30/12   Doppler 08/30/12 had a small amount of growth. It was a 4.2 x 4.3 cm greater in size than the previous one noted at 4 x 3.8.  05-21-15 followed by CT abd-Dr. Kellie Simmering.  . Adenomatous colon polyp   . Ankle edema    Chronic  . Aortic stenosis    mild AS 07/2016 echo  . Asthma    as a child  . CAD, multiple vessel 1985-2010   Most recent cath 01/01/09: 100% Occluded LAD after SP1, patent LIMA-distal LAD with apical 90% lesion.  Cx-OM1 widely patent stent into proximal bifurcating OM1 . Follow-on Cx => 70-80% -- non-amenable PCI. Widely patent 3 overlapping stents in midRCA with only 40% RPL stenosis; SVG- OM known to be occluded.  . Choledocholithiasis   . Cholelithiasis - with cholangitis    status post ERCP with removal of calculi and biliary stent placement.  . Chronic anemia    On iron supplement; history of positive guaiac - negative colonoscopy in 1996.; Thought to be related to hemorrhoids; status post hemorrhoidectomy  . Chronic back pain     multiple surgeries; C-spine and lumbar  . Diabetes mellitus    on oral medication  . Diverticulitis of colon 1996  . Diverticulosis   . Dyslipidemia, goal LDL below 70   . Dyspnea   . Erectile dysfunction   . Exertional dyspnea    Chronic baseline SOB with ambulation  . GERD (gastroesophageal reflux disease)   . H/O unstable angina 06/2003, 12/2006, 05/2014   a) '04: Staged Taxus DES PCI to RCA and Cx-OM;;'08 - PCI to proximal ISR in RCA - Cypher DES; c) 05/2014: ISR in RCA & OM1, PCI with Promus P DES: RCA  2.75 x 12, OM1 3.0 mm x 8)  . H/O: pneumonia February 14  . Hemorrhoids   . Hiatal hernia   .  History of nuclear stress test December 2013   LOW at Risk. Moderate region of mid to basal inferolateral scar without ischemia -- consistent with distal circumflex disease. Mild apical hypokinesis with an EF of 47%.  Marland Kitchen History of: ST elevation myocardial infarction (STEMI) involving left circumflex coronary artery with complication 9169   PTCA-circumflex; PCI in 1991  . Hypertension   . Irregular heart beat    PVCs and PACs, no arrhythmia recorded  . Moderate aortic stenosis by prior echocardiogram October 2016   Normal LV function - EF 55-60%.. Abnormal relaxation. Mild-moderate aortic stenosis (peak/mean gradient 18/10 mmH)  . Osteoarthritis of both knees    And back; multiple back surgeries, right knee arthroplasty and left knee arthroscopic surgery x2  . Pneumonia 2016  . S/P CABG x 2 1997   LIMA-LAD, SVG-OM  . Sleep apnea    pt. states he was told to return for f/u, to be fitted for Cpap, but pt. reports that he didn't follow up-no cpap used    Medications:  Infusions:  . sodium chloride 250 mL (04/21/17 0700)  .  heparin 1,300 Units/hr (04/21/17 0700)  . nitroGLYCERIN 30 mcg/min (04/21/17 4503)    Assessment: 54 yom presented to the ED with CP. Found to have an elevated troponin at 0.05. Started IV heparin then stopped for cath lab - no intervention - restart heparin drip 6 hr after sheath pulled out 2000 PM. Plan to restart drip rate at 1300 units/hr.      Troponin now up to 12. Plan to continue heparin for 72 hours.  Heparin level is therapeutic on 1300 units/hr.  H/H is slightly low at 10.5/32.9 - monitor.  Platelets down 171 >>148. Will watch closely.  He was not on oral anticoagulation PTA.   Goal of Therapy:  Heparin level 0.3-0.7 units/ml Monitor platelets by anticoagulation protocol: Yes   Plan:  Heparin gtt 1300 units/hr Check a 6 hr heparin level  Daily heparin level and CBC F/up for stop time at 72 hours.   Sloan Leiter, PharmD, BCPS Clinical Pharmacist Clinical phone 04/21/2017 until 3:30 PM - [phone] After hours, please call #[phone] 04/21/2017 10:56 AM

## 2017-04-21 NOTE — Care Management Note (Addendum)
Case Management Note  Patient Details  Name: ANDRAS GRUNEWALD MRN: [id number] Date of Birth: August 28, 1936  Subjective/Objective:   From home with wife, pta indep, he has had a CABG in the past, he has an occlusion , will txt medically, will be on ranexa 500mg  bid.  NCM awaiting benefit check.  PA Gae Bon called in exception for ranexa, and sent script to pharmacy , co pay she states will be $10.              Action/Plan: NCM will follow for dc needs.   Expected Discharge Date:  04/21/17               Expected Discharge Plan:  Home/Self Care  In-House Referral:     Discharge planning Services  CM Consult  Post Acute Care Choice:    Choice offered to:     DME Arranged:    DME Agency:     HH Arranged:    HH Agency:     Status of Service:  Completed, signed off  If discussed at [employer] of Stay Meetings, dates discussed:    Additional Comments:  Zenon Mayo, RN 04/21/2017, 9:50 AM

## 2017-04-21 NOTE — Progress Notes (Signed)
Progress Note  Patient Name: [name] Date of Encounter: 04/21/2017  Primary Cardiologist: Ellyn Hack  Subjective   Still has chest pain this morning, improved with SL NTG. Seems to have separate moderate R shoulder pain with musculoskeletal features. Troponin has increased to 12. ECg remains non-diagnostic.  Inpatient Medications    Scheduled Meds: . amLODipine  5 mg Oral Daily  . aspirin EC  81 mg Oral Daily  . clopidogrel  75 mg Oral Daily  . fenofibrate  160 mg Oral Daily  . ferrous sulfate  325 mg Oral Q breakfast  . insulin aspart  0-5 Units Subcutaneous QHS  . insulin aspart  0-9 Units Subcutaneous TID WC  . pantoprazole  40 mg Oral Daily  . ranolazine  500 mg Oral BID  . sodium chloride flush  3 mL Intravenous Q12H   Continuous Infusions: . sodium chloride 250 mL (04/21/17 0700)  . heparin 1,300 Units/hr (04/21/17 0700)  . nitroGLYCERIN 30 mcg/min (04/21/17 0807)   PRN Meds: sodium chloride, acetaminophen, albuterol, dextrose, hydrALAZINE, nitroGLYCERIN, ondansetron (ZOFRAN) IV, sodium chloride flush, traMADol   Vital Signs    Vitals:   04/21/17 0740 04/21/17 0745 04/21/17 0800 04/21/17 0830  BP: (!) 115/58 (!) 114/55 (!) 118/58 (!) 104/56  Pulse:      Resp: (!) 23 16 20 16   Temp:  (!) 97.5 F (36.4 C)    TempSrc:  Oral    SpO2:  96%    Weight:      Height:        Intake/Output Summary (Last 24 hours) at 04/21/17 0901 Last data filed at 04/21/17 0807  Gross per 24 hour  Intake           838.36 ml  Output              800 ml  Net            38.36 ml   Filed Weights   04/20/17 1833 04/20/17 2008 04/21/17 0600  Weight: 214 lb 8 oz (97.3 kg) 215 lb (97.5 kg) 214 lb 15.2 oz (97.5 kg)    Telemetry    NSR - Personally Reviewed  ECG    NSR, no acute repol changes - Personally Reviewed  Physical Exam  Comfortable at rest GEN: No acute distress.   Neck: No JVD Cardiac: RRR, no murmurs, rubs, or gallops.  Respiratory: Clear to  auscultation bilaterally. GI: Soft, nontender, non-distended  MS: No edema; No deformity. Neuro:  Nonfocal  Psych: Normal affect   Labs    Chemistry Recent Labs Lab 04/20/17 1147 04/21/17 0604  NA 138 137  K 4.1 3.9  CL 110 110  CO2 22 21*  GLUCOSE 99 83  BUN 19 18  CREATININE 1.43* 1.38*  CALCIUM 9.0 8.7*  GFRNONAA 45* 47*  GFRAA 52* 55*  ANIONGAP 6 6     Hematology Recent Labs Lab 04/20/17 1147 04/21/17 0604  WBC 4.3 4.6  RBC 3.76* 3.56*  HGB 11.3* 10.5*  HCT 35.0* 32.9*  MCV 93.1 92.4  MCH 30.1 29.5  MCHC 32.3 31.9  RDW 15.1 15.1  PLT 171 148*    Cardiac Enzymes Recent Labs Lab 04/20/17 1147 04/20/17 1801 04/21/17 0044 04/21/17 0604  TROPONINI 0.05* 3.47* 7.87* 12.38*   No results for input(s): TROPIPOC in the last 168 hours.   BNPNo results for input(s): BNP, PROBNP in the last 168 hours.   DDimer No results for input(s): DDIMER in the last 168 hours.  Radiology    Dg Chest 2 View  Result Date: 04/20/2017 CLINICAL DATA:  Chest pain EXAM: CHEST  2 VIEW COMPARISON:  12/01/2016 FINDINGS: Cardiac shadow is stable. Postsurgical changes are again seen. The lungs are well aerated bilaterally. No focal infiltrate or sizable effusion is noted. Degenerative changes of the thoracic spine are seen. IMPRESSION: No active cardiopulmonary disease. Electronically Signed   By: Inez Catalina M.D.   On: 04/20/2017 11:53    Cardiac Studies    Supravalvular aortography: The ascending aorta was moderately dilated but there was no evidence of dissection.   IMPRESSION: Mr. Dicioccio has widely patent stents in his circumflex obtuse marginal branch and right coronary artery. The LAD is occluded at its origin with a patent LIMA graft. I cannot identify a culprit lesion responsible for his chest pain and mild troponin leak. LV function is mildly depressed as described by recent echo and prior cath. The sheath was removed and pressure held on the groin to achieve  hemostasis. The patient left the lab in stable condition. I am going to restart heparin in 6 hours without a bolus given his positive troponin. Diagnostic Diagram        Patient Profile     81 y.o. male with CAD since 1985 with multiple stents and CABG 1997, AAA and repair 08/2015, presenting with small NSTEMI, possibly due to interim occlusion of a segment of the proximal LAD, upstream of LIM-LAD bypass.  Assessment & Plan    CAD/ NSTEMI Seems to have a moderate size MI in territory of proximal LAD, with otherwise patent RCA/LCX/LIMA to LAD bypass. On Plavix, aspirin, heparin (for 72h). Still has some angina and on IV NTG and calcium channel blocker. BP relatively low. Cannot give beta blocker due to history of bradycardia and his COPD. Add Ranexa.   Chronic systolic and diastolic dysfunction Mildly reduced LV systolic function; grade 1 diastolic dysfunction;  EF 45-50% On ARB. No beta blocker due to history of bradycardia Patient appears euvolemic, no dyspnea  Aortic stenosis Mild to moderate. Most recent echo did not suggest it to be as narrow as previously noted per Dr. Allison Quarry note.   Hypertension Home medicines include amlodipine 5 mg, losartan 100 mg daily. Can reduce losartan if need higher doses of amlodipine as antianginal.  Hyperlipidemia Try statin again, although he has  significant myalgias? Probably better suited for PCSK9 inhibitor. Treated with fenofibrate and Zetia currently, LDL 104.  Diabetes type 2  Well controlled. Last A1c was 5.8 on 12/09/16   Signed, Sanda Klein, MD  04/21/2017, 9:01 AM

## 2017-04-21 NOTE — Progress Notes (Signed)
Patient complains of chest pain 4-5 this morning across chest at the 3rd intercostal space, described as tightness. Right shoulder pain 6-7, described as sharp stabbing pain. No change with palpation in either area but reports worsening discomfort in chest with deep breathing. 12 lead ECG completed and page text cardiology. Increased nitroglycerin infusion to 20 mcg after 12 lead obtained. Patient reported slight improvement from 4-5 to 3-4 with chest pain and no change with right shoulder pain. Blood pressure stable 110's/50's. Sinus bradycardia 40's-50's. Denies headache.

## 2017-04-21 NOTE — Progress Notes (Signed)
ANTICOAGULATION CONSULT NOTE - Follow Up Consult  Pharmacy Consult for heparin Indication: chest pain/ACS  Allergies  Allergen Reactions  . Oxycodone Shortness Of Breath and Cough  . Lisinopril Cough  . Statins Other (See Comments)    MYALGIAS   . Welchol [Colesevelam Hcl] Itching    Patient Measurements: Height: 5\' 9"  (175.3 cm) Weight: 214 lb 15.2 oz (97.5 kg) IBW/kg (Calculated) : 70.7 Heparin Dosing Weight: 91.1kg  Vital Signs: Temp: 97.4 F (36.3 C) (07/19 1200) Temp Source: Oral (07/19 1200) BP: 85/45 (07/19 1530) Pulse Rate: 72 (07/19 1200)  Labs:  Recent Labs  04/20/17 1147 04/20/17 1801 04/20/17 2047 04/21/17 0044 04/21/17 0604 04/21/17 0942 04/21/17 1532  HGB 11.3*  --   --   --  10.5*  --   --   HCT 35.0*  --   --   --  32.9*  --   --   PLT 171  --   --   --  148*  --   --   LABPROT  --   --  13.1  --   --   --   --   INR  --   --  0.99  --   --   --   --   HEPARINUNFRC  --   --   --   --   --  0.38 0.51  CREATININE 1.43*  --   --   --  1.38*  --   --   TROPONINI 0.05* 3.47*  --  7.87* 12.38*  --   --     Estimated Creatinine Clearance: 50 mL/min (A) (by C-G formula based on SCr of 1.38 mg/dL (H)).   Assessment: 8 yom presented to the ED with CP. S/p cath yesterday, plan to continue heparin for 72 hours.  Recheck Heparin level is remains therapeutic at 0.51 on 1300 units/hr.   Goal of Therapy:  Heparin level 0.3-0.7 units/ml Monitor platelets by anticoagulation protocol: Yes   Plan:  Continue Heparin gtt 1300 units/hr Daily heparin level and CBC F/up for stop time at 72 hours.   Maryanna Shape, PharmD, BCPS  Clinical Pharmacist  Pager: [phone]   04/21/2017 4:28 PM

## 2017-04-21 NOTE — Progress Notes (Signed)
#   1.  S/W CATHERINE @ [facility] # [phone]    RENEXA  500 MG BID   COVER- NOT COVER  PRIOR APPROVAL- YES # [phone] FOR EXCEPTION   NO ATTITUDE

## 2017-04-21 NOTE — Progress Notes (Signed)
Patient has had no chest pain the remainder of the day after resolved at 900. Nitroglycerin reduced from 30 mcg to 25 mcg for sbp 80's at 1700 and blood pressure slowly increasing 1815 140/65.

## 2017-04-22 DIAGNOSIS — E1159 Type 2 diabetes mellitus with other circulatory complications: Secondary | ICD-10-CM

## 2017-04-22 DIAGNOSIS — I350 Nonrheumatic aortic (valve) stenosis: Secondary | ICD-10-CM

## 2017-04-22 LAB — CBC
HEMATOCRIT: 31.9 % — AB (ref 39.0–52.0)
HEMOGLOBIN: 10 g/dL — AB (ref 13.0–17.0)
MCH: 29 pg (ref 26.0–34.0)
MCHC: 31.3 g/dL (ref 30.0–36.0)
MCV: 92.5 fL (ref 78.0–100.0)
Platelets: 158 10*3/uL (ref 150–400)
RBC: 3.45 MIL/uL — AB (ref 4.22–5.81)
RDW: 15 % (ref 11.5–15.5)
WBC: 5.2 10*3/uL (ref 4.0–10.5)

## 2017-04-22 LAB — TROPONIN I: TROPONIN I: 8.22 ng/mL — AB (ref <0.03)

## 2017-04-22 LAB — GLUCOSE, CAPILLARY
GLUCOSE-CAPILLARY: 91 mg/dL (ref 65–99)
GLUCOSE-CAPILLARY: 100 mg/dL — AB (ref 65–99)

## 2017-04-22 LAB — HEPARIN LEVEL (UNFRACTIONATED): Heparin Unfractionated: 0.66 IU/mL (ref 0.30–0.70)

## 2017-04-22 MED ORDER — ASPIRIN 81 MG PO TBEC: 81.0000 mg | DELAYED_RELEASE_TABLET | Freq: Every day | ORAL | 11 refills | Status: DC

## 2017-04-22 MED ORDER — NITROGLYCERIN 0.4 MG SL SUBL: 0.4000 mg | SUBLINGUAL_TABLET | SUBLINGUAL | 12 refills | Status: DC | PRN

## 2017-04-22 NOTE — Progress Notes (Signed)
Progress Note  Patient Name: [name] Date of Encounter: 04/22/2017  Primary Cardiologist: Ellyn Hack.  Subjective   No further angina at rest or walking in room since we started ranolazine. Has yet to go for a longer walk today.  Inpatient Medications    Scheduled Meds: . amLODipine  5 mg Oral Daily  . aspirin EC  81 mg Oral Daily  . clopidogrel  75 mg Oral Daily  . fenofibrate  160 mg Oral Daily  . ferrous sulfate  325 mg Oral Q breakfast  . insulin aspart  0-5 Units Subcutaneous QHS  . insulin aspart  0-9 Units Subcutaneous TID WC  . pantoprazole  40 mg Oral Daily  . ranolazine  500 mg Oral BID  . sodium chloride flush  3 mL Intravenous Q12H   Continuous Infusions: . sodium chloride 250 mL (04/22/17 0440)  . heparin 1,300 Units/hr (04/22/17 0439)  . nitroGLYCERIN 25 mcg/min (04/22/17 0438)   PRN Meds: sodium chloride, acetaminophen, albuterol, ALPRAZolam, dextrose, hydrALAZINE, nitroGLYCERIN, ondansetron (ZOFRAN) IV, sodium chloride flush, traMADol   Vital Signs    Vitals:   04/22/17 0600 04/22/17 0700 04/22/17 0719 04/22/17 1154  BP: (!) 105/42 (!) 112/39  125/64  Pulse: (!) 59 (!) 56  63  Resp: 15 15  (!) 21  Temp:   97.9 F (36.6 C) 97.8 F (36.6 C)  TempSrc:   Oral Oral  SpO2: 95% 96%  97%  Weight:      Height:        Intake/Output Summary (Last 24 hours) at 04/22/17 1212 Last data filed at 04/22/17 1100  Gross per 24 hour  Intake           938.45 ml  Output             1350 ml  Net          -411.55 ml   Filed Weights   04/20/17 2008 04/21/17 0600 04/22/17 0113  Weight: 215 lb (97.5 kg) 214 lb 15.2 oz (97.5 kg) 213 lb 10 oz (96.9 kg)    Telemetry    NSR - Personally Reviewed  ECG    NSR, long PR 293ms - Personally Reviewed  Physical Exam  Comfortable, relaxed GEN: No acute distress.   Neck: No JVD Cardiac: RRR, no murmurs, rubs, or gallops.  Respiratory: Clear to auscultation bilaterally. GI: Soft, nontender, non-distended  MS:  No edema; No deformity. Neuro:  Nonfocal  Psych: Normal affect   Labs    Chemistry Recent Labs Lab 04/20/17 1147 04/21/17 0604  NA 138 137  K 4.1 3.9  CL 110 110  CO2 22 21*  GLUCOSE 99 83  BUN 19 18  CREATININE 1.43* 1.38*  CALCIUM 9.0 8.7*  GFRNONAA 45* 47*  GFRAA 52* 55*  ANIONGAP 6 6     Hematology Recent Labs Lab 04/20/17 1147 04/21/17 0604 04/22/17 0320  WBC 4.3 4.6 5.2  RBC 3.76* 3.56* 3.45*  HGB 11.3* 10.5* 10.0*  HCT 35.0* 32.9* 31.9*  MCV 93.1 92.4 92.5  MCH 30.1 29.5 29.0  MCHC 32.3 31.9 31.3  RDW 15.1 15.1 15.0  PLT 171 148* 158    Cardiac Enzymes Recent Labs Lab 04/21/17 0044 04/21/17 0604 04/21/17 1847 04/22/17 0108  TROPONINI 7.87* 12.38* 8.77* 8.22*   No results for input(s): TROPIPOC in the last 168 hours.   BNPNo results for input(s): BNP, PROBNP in the last 168 hours.   DDimer No results for input(s): DDIMER in the last 168 hours.  Radiology    No results found.  Cardiac Studies   Supravalvular aortography:The ascending aorta was moderately dilated but there was no evidence of dissection.   IMPRESSION:[name] has widely patent stents in his circumflex obtuse marginal branch and right coronary artery. The LAD is occluded at its origin with a patent LIMA graft. I cannot identify a culprit lesion responsible for his chest pain and mild troponin leak. LV function is mildly depressed as described by recent echo and prior cath. The sheath was removed and pressure held on the groin to achieve hemostasis. The patient left the lab in stable condition. I am going to restart heparin in 6 hours without a bolus given his positive troponin. Diagnostic Diagram        Patient Profile     81 y.o. male with CAD since 1985 with multiple stents and CABG 1997, AAA and repair 08/2015, presenting with small NSTEMI, possibly due to interim occlusion of a segment of the proximal LAD, upstream of LIM-LAD bypass.  Assessment & Plan      CAD/ NSTEMI Seems to have a moderate size MI in territory of proximal LAD, with otherwise patent RCA/LCX/LIMA to LAD bypass. On Plavix, aspirin, heparin (for 72h). Still had some angina and on nitrates and calcium channel blocker. BP relatively low. Cannot give beta blocker due to history of bradycardia and his COPD. Asymptomatic after we added Ranexa.   If he is still asymptomatic walking in hallway, will DC later today.   Chronic systolic and diastolic dysfunction Mildly reduced LV systolic function; grade 1 diastolic dysfunction; EF 97-58% On ARB. No beta blocker due to history of bradycardia Clinically euvolemic, class I NYHA  Aortic stenosis Mild to moderate. Not yet hemodynamically significant.  Hypertension Home medicines include amlodipine 5 mg, losartan 100 mg daily. Can reduce losartan if need higher doses of amlodipine as antianginal.  Hyperlipidemia Try statin again, although he has  significant myalgias? Probably better suited for PCSK9 inhibitor. Treated with fenofibrate and Zetia currently, LDL 104. Refer to lipid clinic.  Diabetes type 2  Well controlled. Last A1c was 5.8 on 12/09/16  Signed, Sanda Klein, MD  04/22/2017, 12:12 PM

## 2017-04-22 NOTE — Progress Notes (Signed)
ANTICOAGULATION CONSULT NOTE - Follow Up Consult  Pharmacy Consult for heparin Indication: chest pain/ACS  Allergies  Allergen Reactions  . Oxycodone Shortness Of Breath and Cough  . Lisinopril Cough  . Statins Other (See Comments)    MYALGIAS   . Welchol [Colesevelam Hcl] Itching    Patient Measurements: Height: 5\' 9"  (175.3 cm) Weight: 213 lb 10 oz (96.9 kg) IBW/kg (Calculated) : 70.7 Heparin Dosing Weight: 91.1kg  Vital Signs: Temp: 97.9 F (36.6 C) (07/20 0719) Temp Source: Oral (07/20 0719) BP: 112/39 (07/20 0700) Pulse Rate: 56 (07/20 0700)  Labs:  Recent Labs  04/20/17 1147  04/20/17 2047  04/21/17 0604 04/21/17 0942 04/21/17 1532 04/21/17 1847 04/22/17 0108 04/22/17 0320  HGB 11.3*  --   --   --  10.5*  --   --   --   --  10.0*  HCT 35.0*  --   --   --  32.9*  --   --   --   --  31.9*  PLT 171  --   --   --  148*  --   --   --   --  158  LABPROT  --   --  13.1  --   --   --   --   --   --   --   INR  --   --  0.99  --   --   --   --   --   --   --   HEPARINUNFRC  --   --   --   --   --  0.38 0.51  --   --  0.66  CREATININE 1.43*  --   --   --  1.38*  --   --   --   --   --   TROPONINI 0.05*  < >  --   < > 12.38*  --   --  8.77* 8.22*  --   < > = values in this interval not displayed.  Estimated Creatinine Clearance: 49.9 mL/min (A) (by C-G formula based on SCr of 1.38 mg/dL (H)).   Assessment: 29 yom presented to the ED with CP. S/p cath 7/18, plan to continue heparin for 72 hours.  Heparin level remains therapeutic at 0.66 on 1300 units/hr. No bleeding issues noted, cbc stable.   Goal of Therapy:  Heparin level 0.3-0.7 units/ml Monitor platelets by anticoagulation protocol: Yes   Plan:  Continue Heparin gtt 1300 units/hr Daily heparin level and CBC F/up for stop time at 72 hours.   Erin Hearing PharmD., BCPS Clinical Pharmacist Pager [phone] 04/22/2017 11:01 AM

## 2017-04-22 NOTE — Discharge Summary (Signed)
Discharge Summary    Patient ID: [name],  MRN: [id number], DOB/AGE: Jul 01, 1936 81 y.o.  Admit date: 04/20/2017 Discharge date: 04/22/2017  Primary Care Provider: [facility] Primary Cardiologist: Dr. Ellyn Hack  Discharge Diagnoses    Active Problems:   Unstable angina (HCC)   Non-STEMI (non-ST elevated myocardial infarction) [facility])   NSTEMI (non-ST elevated myocardial infarction) ([place])   Allergies Allergies  Allergen Reactions  . Oxycodone Shortness Of Breath and Cough  . Lisinopril Cough  . Statins Other (See Comments)    MYALGIAS   . Welchol [Colesevelam Hcl] Itching    Diagnostic Studies/Procedures    Left Heart Cath and Cors/Grafts Angiography  04/20/17  Conclusion     Prox RCA to Mid RCA lesion, 0 %stenosed.  LM lesion, 100 %stenosed.  Ost 3rd Mrg to 3rd Mrg lesion, 0 %stenosed.  LIMA and is normal in caliber and anatomically normal.  Ost LAD to Prox LAD lesion, 100 %stenosed.  There is mild left ventricular systolic dysfunction.  LV end diastolic pressure is normal.  The left ventricular ejection fraction is 45-50% by visual estimate.     IMPRESSION: [name] has widely patent stents in his circumflex obtuse marginal branch and right coronary artery. The LAD is occluded at its origin with a patent LIMA graft. I cannot identify a culprit lesion responsible for his chest pain and mild troponin leak. LV function is mildly depressed as described by recent echo and prior cath. The sheath was removed and pressure held on the groin to achieve hemostasis. The patient left the lab in stable condition. I am going to restart heparin in 6 hours without a bolus given his positive troponin.  Diagnostic Diagram       _____________   History of Present Illness     [name] is a 81 y.o. male with past medical history of CAD since 1985 with multiple stents and CABG 1997, AAA and repair 08/2015, and recent left total knee arthroplasty on  12/20/16. The patient has long-standing history of CAD dating back to 56. Most recent cardiac evaluation and treatment included a staged PCI in August 2015 with PCI to in-stent restenosis in the RCA as well as OM 1, both areas treated with DES. He then had a cardiac catheterization shortly thereafter showing widely patent stents and grafts. He is intolerant of statins. Treated with fenofibrate.  Most recent echocardiogram on 07/19/16 showed mildly decreased LV function with EF 45-50%, hypokinesis of the inferior lateral myocardium, grade 1 diastolic dysfunction, calcified aortic valve with mild AI, moderate LAE, mild RAE, trace TR  On 04/20/17 the patient developed substernal chest pressure radiating to the bilateral arms at a level of 8-9/10. He had no associated shortness of breath, diaphoresis, lightheadedness or nausea. He tried sublingual nitroglycerin at home but it was old and had no effect. His wife called 64 and he was given sublingual nitroglycerin en route with improvement in his discomfort. He continues to have chest discomfort that is nonradiating at a level of 5-6/10. Prior to this episode he has had no recent exertional chest discomfort. He has chronic dyspnea on exertion that has been no worse of late. No orthopnea, PND or edema.  The patient does not smoke. He drinks one to 3 beers approximately 3 days per week and drinks about 4 ounces of wine to help his cholesterol about 3-4 days per week.   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 A follow-up CT angiography showed only a trace amount of contrast enhancement seen within the excluded aneurysmal sac anterior to the limbs. The aneurysm sac itself remains stable in diameter.   Hospital Course     Consultants: None   Troponins  were cycled and became elevated with peak of 12.38, currently trending down. EKG did not show ischemic changes. The patient was taken to the Cath Lab on 04/20/17 and found to have widely patent stents in his circumflex obtuse marginal branch and RCA. The LAD is occluded at its origin with a patent LIMA graft. There was no culprit lesion identified to be intervened on. The patient seems to have a moderate size MI in territory of proximal LAD, with otherwise patent RCA/LCX/LIMA to LAD bypass. On Plavix and aspirin. Still had some angina and oncalcium channel blocker. BP relatively low. Cannot givebeta blocker due to history of bradycardia and his COPD. Ranexa has been added. He is asymptomatic up walking in the halls today.  Echo 07/2016 showed mildly reduced LV systolic function; grade 1 diastolic dysfunction; EF 09-47%. On ARB. No beta blocker due to history of bradycardia. Clinically euvolemic, class I NYHA.  He has mild-mod AS not yet hemodynamically significant. We'll continue his home medications of amlodipine 5 mg and losartan 100 mg for hypertension control. Can reduce the Losartan if he needs higher doses of amlodipine as an anti-anginal. For hyperlipidemia recommend to retry a statin, although he has had significant myalgias. He is probably better suited for a PCSK9 inhibitor. He is currently treated with fenofibrate and Zetia with an LDL of 106. He should be referred to the lipid clinic. His diabetes is well-controlled with the last A1c of 5.8 on 12/09/16.  We'll discharge home today on Ranexa.  Patient has been seen by Dr. Sallyanne Kuster today and deemed ready for discharge home. All follow up appointments have been scheduled. Discharge medications are listed below. _____________  Discharge Vitals Blood pressure 125/64, pulse 63, temperature 97.8 F (36.6 C), temperature source Oral, resp. rate (!) 21, height '5\' 9"'  (1.753 m), weight 213 lb 10 oz (96.9 kg), SpO2 97 %.  Filed Weights   04/20/17 2008  04/21/17 0600 04/22/17 0113  Weight: 215 lb (97.5 kg) 214 lb 15.2 oz (97.5 kg) 213 lb 10 oz (96.9 kg)    Labs & Radiologic Studies    CBC  Recent Labs  04/20/17 1147 04/21/17 0604 04/22/17 0320  WBC 4.3 4.6 5.2  NEUTROABS 2.9  --   --   HGB 11.3* 10.5* 10.0*  HCT 35.0* 32.9* 31.9*  MCV 93.1 92.4 92.5  PLT 171 148* 096   Basic Metabolic Panel  Recent Labs  04/20/17 1147 04/21/17 0604  NA 138 137  K 4.1 3.9  CL 110 110  CO2 22 21*  GLUCOSE 99 83  BUN 19 18  CREATININE 1.43* 1.38*  CALCIUM 9.0 8.7*   Liver Function Tests No results for input(s): AST, ALT, ALKPHOS, BILITOT, PROT, ALBUMIN in the last 72 hours. No results for input(s): LIPASE, AMYLASE in the last 72 hours. Cardiac Enzymes  Recent Labs  04/21/17 0604 04/21/17 1847 04/22/17 0108  TROPONINI 12.38* 8.77* 8.22*   BNP Invalid input(s): POCBNP D-Dimer No results for input(s): DDIMER in the last 72 hours. Hemoglobin A1C No results for input(s): HGBA1C in the last 72 hours. Fasting Lipid Panel  No results for input(s): CHOL, HDL, LDLCALC, TRIG, CHOLHDL, LDLDIRECT in the last 72 hours. Thyroid Function Tests No results for input(s): TSH, T4TOTAL, T3FREE, THYROIDAB in the last 72 hours.  Invalid input(s): FREET3 _____________  Dg Chest 2 View  Result Date: 04/20/2017 CLINICAL DATA:  Chest pain EXAM: CHEST  2 VIEW COMPARISON:  12/01/2016 FINDINGS: Cardiac shadow is stable. Postsurgical changes are again seen. The lungs are well aerated bilaterally. No focal infiltrate or sizable effusion is noted. Degenerative changes of the thoracic spine are seen. IMPRESSION: No active cardiopulmonary disease. Electronically Signed   By: Inez Catalina M.D.   On: 04/20/2017 11:53   Disposition   Pt is being discharged home today in good condition.  Follow-up Plans & Appointments    Follow-up Information    Leonie Man, MD Follow up.   Specialty:  Cardiology Why:  on August 3rd at 3:00 for cardiology  hospital follow up.  Contact information: [address] [place] [employer] [address] [phone]          Discharge Instructions    Diet - low sodium heart healthy    Complete by:  As directed    Discharge instructions    Complete by:  As directed    PLEASE REMEMBER TO BRING ALL OF YOUR MEDICATIONS TO EACH OF YOUR FOLLOW-UP OFFICE VISITS.  PLEASE ATTEND ALL SCHEDULED FOLLOW-UP APPOINTMENTS.   Activity: Increase activity slowly as tolerated. You may shower, but no soaking baths (or swimming) for 1 week. No driving for 1 week.  You May Return to Work: in 1 week (if applicable)  Wound Care: You may wash cath site gently with soap and water. Keep cath site clean and dry. If you notice pain, swelling, bleeding or pus at your cath site, please call [phone].   Increase activity slowly    Complete by:  As directed       Discharge Medications   Current Discharge Medication List    START taking these medications   Details  aspirin EC 81 MG EC tablet Take 1 tablet (81 mg total) by mouth daily. Qty: 30 tablet, Refills: 11    nitroGLYCERIN (NITROSTAT) 0.4 MG SL tablet Place 1 tablet (0.4 mg total) under the tongue every 5 (five) minutes x 3 doses as needed for chest pain. Qty: 25 tablet, Refills: 12    ranolazine (RANEXA) 500 MG 12 hr tablet Take 1 tablet (500 mg total) by mouth 2 (two) times daily. Qty: 60 tablet, Refills: 5      CONTINUE these medications which have NOT CHANGED   Details  acetaminophen (TYLENOL) 500 MG tablet Take 1,000 mg by mouth every 6 (six) hours as needed for mild pain, moderate pain or headache. Reported on 03/16/2016    amLODipine (NORVASC) 5 MG tablet TAKE 5 MG BY MOUTH DAILY....need appointment before further refills Qty: 90 tablet, Refills: 0    Choline Fenofibrate (FENOFIBRIC ACID) 135 MG CPDR TAKE ONE CAPSULE BY MOUTH ONCE DAILY Qty: 90 capsule, Refills: 1    clopidogrel (PLAVIX) 75 MG tablet TAKE ONE TABLET BY MOUTH ONCE  DAILY Qty: 90 tablet, Refills: 3    ferrous sulfate 325 (65 FE) MG tablet Take 325 mg by mouth daily with breakfast.    losartan (COZAAR) 100 MG tablet TAKE ONE TABLET BY MOUTH ONCE DAILY Qty: 90 tablet, Refills: 1    Multiple Vitamins-Minerals (MULTIVITAMIN WITH MINERALS) tablet Take 1 tablet by mouth daily.     pantoprazole (PROTONIX) 40 MG tablet TAKE ONE  TABLET BY MOUTH ONCE DAILY Qty: 90 tablet, Refills: 3    pioglitazone-metformin (ACTOPLUS MET) 15-500 MG tablet Take 1 tablet by mouth daily. Qty: 90 tablet, Refills: 0    albuterol (PROVENTIL HFA;VENTOLIN HFA) 108 (90 Base) MCG/ACT inhaler Inhale 2 puffs into the lungs every 6 (six) hours as needed for wheezing or shortness of breath. Qty: 1 Inhaler, Refills: 0    sildenafil (REVATIO) 20 MG tablet TAKE 2-5 TABLETS BY MOUTH DAILY AS NEEDED Qty: 50 tablet, Refills: 5         Aspirin prescribed at discharge?  Yes High Intensity Statin Prescribed? (Lipitor 40-39m or Crestor 20-444m: No- does not tolerate statins.  Beta Blocker Prescribed? No due to bradycardia For EF <40%, was ACEI/ARB Prescribed? No: N/A ADP Receptor Inhibitor Prescribed? (i.e. Plavix etc.-Includes Medically Managed Patients): Yes For EF <40%, Aldosterone Inhibitor Prescribed? No: N/A Was EF assessed during THIS hospitalization? No: recently checked Was Cardiac Rehab II ordered? (Included Medically managed Patients): No   Outstanding Labs/Studies     Duration of Discharge Encounter   Greater than 30 minutes including physician time.  Signed, JaDaune PerchP 04/22/2017, 2:17 PM

## 2017-05-06 ENCOUNTER — Ambulatory Visit (INDEPENDENT_AMBULATORY_CARE_PROVIDER_SITE_OTHER): Payer: Medicare Other | Admitting: Cardiology

## 2017-05-06 ENCOUNTER — Encounter: Payer: Self-pay | Admitting: Cardiology

## 2017-05-06 VITALS — BP 167/81 | HR 88 | Ht 69.0 in | Wt 213.0 lb

## 2017-05-06 DIAGNOSIS — I214 Non-ST elevation (NSTEMI) myocardial infarction: Secondary | ICD-10-CM | POA: Diagnosis not present

## 2017-05-06 DIAGNOSIS — Z79899 Other long term (current) drug therapy: Secondary | ICD-10-CM | POA: Diagnosis not present

## 2017-05-06 DIAGNOSIS — I110 Hypertensive heart disease with heart failure: Secondary | ICD-10-CM

## 2017-05-06 DIAGNOSIS — E785 Hyperlipidemia, unspecified: Secondary | ICD-10-CM

## 2017-05-06 DIAGNOSIS — Z955 Presence of coronary angioplasty implant and graft: Secondary | ICD-10-CM | POA: Diagnosis not present

## 2017-05-06 DIAGNOSIS — E669 Obesity, unspecified: Secondary | ICD-10-CM

## 2017-05-06 DIAGNOSIS — I5032 Chronic diastolic (congestive) heart failure: Secondary | ICD-10-CM

## 2017-05-06 DIAGNOSIS — I2510 Atherosclerotic heart disease of native coronary artery without angina pectoris: Secondary | ICD-10-CM | POA: Diagnosis not present

## 2017-05-06 DIAGNOSIS — Z9861 Coronary angioplasty status: Secondary | ICD-10-CM | POA: Diagnosis not present

## 2017-05-06 DIAGNOSIS — I25119 Atherosclerotic heart disease of native coronary artery with unspecified angina pectoris: Secondary | ICD-10-CM | POA: Diagnosis not present

## 2017-05-06 MED ORDER — EZETIMIBE 10 MG PO TABS: 10.0000 mg | ORAL_TABLET | Freq: Every day | ORAL | 6 refills | Status: DC

## 2017-05-06 NOTE — Progress Notes (Signed)
PCP: Julian Lung, MD  Clinic Note: Chief Complaint  Patient presents with  . Hospitalization Follow-up    NSTEMI  . Coronary Artery Disease    HPI: [name] is a 81 y.o. male with a PMH below who presents today for Hospital follow-up. I haven't seen him since January of this year when we did preoperative evaluation for knee surgery He has a long-standing history of CAD dating back 2005 also has history of AAA status post repair. Prior to his most recent hospitalization, his last catheterization was in August 2015 when he had staged PCI to the RCA (in-stent restenosis, and OM1). Follow-up Showed patent stents and grafts. - He is intolerant of statins currently on fenofibrate (having stopped Zetia).  Back in June, he followed up with Dr. Donzetta Matters from [facility] for AAA - has had issus with Endoleak.  Had Lumbar artery embolization by IR & IMA "stabilization" by Dr. Donzetta Matters.   Recent Hospitalizations: July 18-20, 2018: Admitted with non-ST elevation MI. Cardiac catheterization showed occluded proximal portion of the LAD but no PCI target. He was put back on Plavix. Started on Ranexa.  Studies Personally Reviewed - (if available, images/films reviewed: From Epic Chart or Care Everywhere)  CARDIAC CATH 04/20/2017: New finding is LAD now occluded prior to [place].  Not good PCI option.  Prox RCA to Mid RCA stents, 0 %stenosed.  Ost 3rd Mrg to 3rd Mrg stents, 0 %stenosed.  LIMA-LAD and is normal in caliber and anatomically normal.  Ost LAD to Prox LAD lesion, 100 %stenosed - prior to SP1/Diag1  There is mild left ventricular systolic dysfunction.  The left ventricular ejection fraction is 45-50% by visual estimate  LV end diastolic pressure is normal.    Interval History: [name] presents today "frustrated because he had to wait and therefore his blood pressures is a little bit high. He says his blood pressures usually 130/70 at home. He was little bit confused about his  hospital stay and in the outcome of heart catheterization. Initially there was not thought to be anemia differences, however on review of the images, there is clearly a new finding that the LAD was occluded proximal to SP1 D1 and that is likely the cause of his MI.  He still has an occasional discomfort in his chest, but nothing like his admitting anginal symptoms. Is able to start getting back into doing things now without notable symptoms on current medications. No chest pain or shortness of breath with rest or exertion. No PND, orthopnea or edema. No palpitations, lightheadedness, dizziness, weakness or syncope/near syncope. No TIA/amaurosis fugax symptoms. No melena, hematochezia, hematuria, or epstaxis. No claudication.  ROS: A comprehensive was performed. Review of Systems  Constitutional: Negative for malaise/fatigue.  HENT: Negative for congestion.   Respiratory: Positive for shortness of breath (Stable exertional dyspnea).   Cardiovascular: Negative for claudication.       Per history of present illness  Gastrointestinal: Negative for abdominal pain and heartburn.  Genitourinary: Positive for frequency (Baseline nocturia).  Musculoskeletal: Positive for joint pain (Normal for his pains).  Neurological: Negative for dizziness.  Psychiatric/Behavioral: Negative for depression. The patient is not nervous/anxious and does not have insomnia.   All other systems reviewed and are negative.  I have reviewed and (if needed) personally updated the patient's problem list, medications, allergies, past medical and surgical history, social and family history.   Past Medical History:  Diagnosis Date  . Adenomatous colon polyp   . Ankle edema  Chronic  . Asthma    as a child  . CAD S/P percutaneous coronary angioplasty 06/2003, 12/2006, 05/2014   a) '04: Staged Taxus DES PCI to RCA and Cx-OM;;'08 - PCI to proximal ISR in RCA - Cypher DES; c) 05/2014: ISR in RCA & OM1, PCI with Promus P DES:  RCA  2.75 x 12, OM1 3.0 mm x 8)  . CAD, multiple vessel 1985-2010   Most recent cath 01/01/09: 100% Occluded LAD after SP1, patent LIMA-distal LAD with apical 90% lesion.  Cx-OM1 widely patent stent into proximal bifurcating OM1 . Follow-on Cx => 70-80% -- non-amenable PCI. Widely patent 3 overlapping stents in midRCA with only 40% RPL stenosis; SVG- OM known to be occluded.  . Cholelithiasis - with cholangitis & choledocholithiasis    status post ERCP with removal of calculi and biliary stent placement.  . Chronic anemia    On iron supplement; history of positive guaiac - negative colonoscopy in 1996.; Thought to be related to hemorrhoids; status post hemorrhoidectomy  . Chronic back pain     multiple surgeries; C-spine and lumbar  . Diabetes mellitus    on oral medication  . Diverticulitis of colon 1996  . Diverticulosis   . Dyslipidemia, goal LDL below 70   . Erectile dysfunction   . Exertional dyspnea    Chronic baseline SOB with ambulation  . GERD (gastroesophageal reflux disease)   . H/O: pneumonia February 14  . Hemorrhoids   . Hiatal hernia   . History of: ST elevation myocardial infarction (STEMI) involving left circumflex coronary artery with complication 7116   PTCA-circumflex; PCI in 1991  . Hypertension   . Moderate aortic stenosis by prior echocardiogram 07/2015   Normal LV function - EF 55-60%.. Abnormal relaxation. Mild-moderate aortic stenosis (peak/mean gradient 18/10 mmH)  . Osteoarthritis of both knees    And back; multiple back surgeries, right knee arthroplasty and left knee arthroscopic surgery x2  . Pneumonia 2016  . S/P AAA (abdominal aortic aneurysm) repair 08/30/2012   s/p EVAR  . S/P CABG x 2 1997   LIMA-LAD, SVG-OM  . Sleep apnea    pt. states he was told to return for f/u, to be fitted for Cpap, but pt. reports that he didn't follow up-no cpap used    Past Surgical History:  Procedure Laterality Date  . Abdominal and Lower Extremity Arterial  Ultrasound  08/23/2012; 10/10/2013   Normal ABIs. Nonocclusive lower extremity disease. 4.2 cm x 4.3 cm infrarenal AAA;; 4.4 cm x 4.3 cm (essentially stable)   . ABDOMINAL AORTIC ENDOVASCULAR STENT GRAFT N/A 08/13/2015   Procedure: ABDOMINAL AORTIC ENDOVASCULAR STENT GRAFT;  Surgeon: Mal Misty, MD;  Location: [facility];  Service: Vascular;  Laterality: N/A;  . Anterior cervical plating  04/23/10   At C4-5 and a C6-7 utilizing two separate Biomet MaxAn plates.  . ANTERIOR LAT LUMBAR FUSION Left 11/22/2012   Procedure: ANTERIOR LATERAL LUMBAR FUSION 1 LEVEL;  Surgeon: Eustace Moore, MD;  Location: [facility];  Service: Neurosurgery;  Laterality: Left;  Anterior Lateral Lumbar Fusion Lumbar Three-Four  . BACK SURGERY  1979 & x 10   pt. remarks, "I have had about 10 back surgeries"  . BILIARY STENT PLACEMENT N/A 07/03/2014   Procedure: BILIARY STENT PLACEMENT;  Surgeon: Inda Castle, MD;  Location: [facility];  Service: Endoscopy;  Laterality: N/A;  . CARDIAC CATHETERIZATION  September 2004   None Occluded vein graft to OM; diffuse RCA disease in the mid vessel,  80% circumflex-OM stenosis; follow on AV groove circumflex with sequential 90% stenoses and intervening saccular dilation   . CARDIAC CATHETERIZATION  March 2010   4 abnormal Myoview showing apical thinning (possibly due to apical LAD 95%) : 100% Occluded LAD after SV1, distal LAD grafted via LIMA -apical 95% . Cx -OM1 w/patent stent extending into OM 1 . Follow on Cx - 70-80% - non-amenable PCI. RCA widely patent 3 overlapping stents in mRCA w/less than 40% stenosisin RPL; SVG-OM known occluded   . CARDIAC CATHETERIZATION  04/20/2017  . CATARACT EXTRACTION    . Cervical arthrodesis  04/23/10   Anterior cervical arthrodesis, C4-5, C6-7 utilizing 7-mm PEEK interbody cage packed with local autograft & Antifuse putty at C4-5 & an 8-mm cage at C6-7.  Marland Kitchen CERVICAL DISCECTOMY  04/23/10   Decompressive anterior carvical diskectomy. C4-5, C6-7  .  CHOLECYSTECTOMY N/A 05/23/2015   Procedure: LAPAROSCOPIC CHOLECYSTECTOMY WITH INTRAOPERATIVE CHOLANGIOGRAM;  Surgeon: Alphonsa Overall, MD;  Location: [facility];  Service: General;  Laterality: N/A;  . [place]  . CORONARY ANGIOPLASTY  1985,1991,15   1985 lateral STEMI Circumflex PTCA  . CORONARY ARTERY BYPASS GRAFT  1990   LIMA-LAD, SVG-OM  . ERCP N/A 07/03/2014   Procedure: ENDOSCOPIC RETROGRADE CHOLANGIOPANCREATOGRAPHY (ERCP);  Surgeon: Inda Castle, MD;  Location: [facility];  Service: Endoscopy;  Laterality: N/A;  . ERCP N/A 07/05/2014   Procedure: ENDOSCOPIC RETROGRADE CHOLANGIOPANCREATOGRAPHY (ERCP);  Surgeon: Inda Castle, MD;  Location: [facility];  Service: Endoscopy;  Laterality: N/A;  . ERCP N/A 06/30/2015   Procedure: ENDOSCOPIC RETROGRADE CHOLANGIOPANCREATOGRAPHY (ERCP);  Surgeon: Ladene Artist, MD;  Location: [facility];  Service: Endoscopy;  Laterality: N/A;  . EYE SURGERY    . IR ANGIOGRAM EXTREMITY LEFT  01/18/2017  . IR ANGIOGRAM PELVIS SELECTIVE OR SUPRASELECTIVE  01/18/2017  . IR ANGIOGRAM SELECTIVE EACH ADDITIONAL VESSEL  01/18/2017  . IR AORTAGRAM ABDOMINAL SERIALOGRAM  01/18/2017  . IR EMBO ARTERIAL NOT HEMORR HEMANG INC GUIDE ROADMAPPING  01/18/2017  . IR RADIOLOGIST EVAL & MGMT  01/04/2017  . IR US GUIDE VASC ACCESS RIGHT  01/18/2017  . KNEE ARTHROSCOPY Left    x 2  . LEFT HEART CATH AND CORS/GRAFTS ANGIOGRAPHY N/A 04/20/2017   Procedure: Left Heart Cath and Cors/Grafts Angiography;  Surgeon: Lorretta Harp, MD;  Location: [facility];  Service: Cardiovascular;  Laterality: N/A;  . LEFT HEART CATHETERIZATION WITH CORONARY ANGIOGRAM N/A 05/15/2014   Procedure: LEFT HEART CATHETERIZATION WITH CORONARY ANGIOGRAM;  Surgeon: Sinclair Grooms, MD;  Location: [facility];  Service: Cardiovascular;  Laterality: N/A;  . LEFT HEART CATHETERIZATION WITH CORONARY/GRAFT ANGIOGRAM N/A 06/28/2014   Procedure: LEFT HEART CATHETERIZATION WITH Beatrix Fetters;   Surgeon: Troy Sine, MD;  Location: [facility];  Service: Cardiovascular;  Laterality: N/A;  . LUMBAR PERCUTANEOUS PEDICLE SCREW 1 LEVEL N/A 11/22/2012   Procedure: LUMBAR PERCUTANEOUS PEDICLE SCREW 1 LEVEL;  Surgeon: Eustace Moore, MD;  Location: [facility];  Service: Neurosurgery;  Laterality: N/A;  Lumbar Three-Four Percutaneous Pedicle Screw, Lateral approach  . MINOR HEMORRHOIDECTOMY    . NM MYOVIEW LTD  December 2013   LOW RISK. Mmoderate region of mid to basal inferolateral scar without ischemia. Mild apical hypokinesis with an EF of 47%.  Marland Kitchen PERCUTANEOUS CORONARY STENT INTERVENTION (PCI-S)  September 2004   PCI - RCA 2 overlapping Taxus DES 2.75 mm x 32 mm and 2.75 mm x 12 mm (3.0 mm); PCI-Cx-OM1 - Taxus DES 3.0  mm x 20 mm (3.1 mm);   Marland Kitchen PERCUTANEOUS CORONARY STENT INTERVENTION (PCI-S)  March 2008   80% ISR in proximal Taxus stent in RCA -- covered proximally with Cypher DES 3.0 mm x 12 mm  . PERCUTANEOUS CORONARY STENT INTERVENTION (PCI-S) N/A 05/17/2014   Procedure: PERCUTANEOUS CORONARY STENT INTERVENTION (PCI-S);  Surgeon: Sinclair Grooms, MD;  Location: [facility];  Service: Cardiovascular;  Laterality: N/A;  . PERIPHERAL VASCULAR CATHETERIZATION  11/03/2016   Procedure: Embolization;  Surgeon: Waynetta Sandy, MD;  Location: [facility];  Service: Cardiovascular;;  . POSTERIOR CERVICAL FUSION/FORAMINOTOMY N/A 05/02/2013   Procedure: POSTERIOR CERVICAL FUSION/FORAMINOTOMY CERVICAL SEVEN THORACIC-ONE;  Surgeon: Eustace Moore, MD;  Location: [facility];  Service: Neurosurgery;  Laterality: N/A;  POSTERIOR CERVICAL FUSION/FORAMINOTOMY CERVICAL SEVEN THORACIC-ONE  . SPHINCTEROTOMY N/A 06/30/2015   Procedure: SPHINCTEROTOMY;  Surgeon: Ladene Artist, MD;  Location: [facility];  Service: Endoscopy;  Laterality: N/A;  . TOTAL KNEE ARTHROPLASTY Right   . TOTAL KNEE ARTHROPLASTY Left 12/20/2016   Procedure: LEFT TOTAL KNEE ARTHROPLASTY;  Surgeon: Vickey Huger, MD;   Location: [facility];  Service: Orthopedics;  Laterality: Left;  . TRANSTHORACIC ECHOCARDIOGRAM  December 2013; October 2016   a. EF 55-60%. mod LA dilation. Aortic Sclerosis;; b. EF 55-60%, Gr 1 DD, Mild-Mod AS (Peark - Mean Gradient 18-10 mmHg)  . TRANSTHORACIC ECHOCARDIOGRAM  07/2016   EF 45-50%. Inferolateral hypokinesis (correlates with scar noted on Myoview). GR 1 DD. Mild aortic stenosis. Mod LAE.  Marland Kitchen VISCERAL ANGIOGRAM  11/03/2016   Procedure: Visceral Angiogram;  Surgeon: Waynetta Sandy, MD;  Location: [facility];  Service: Cardiovascular;;    Current Meds  Medication Sig  . acetaminophen (TYLENOL) 500 MG tablet Take 1,000 mg by mouth every 6 (six) hours as needed for mild pain, moderate pain or headache. Reported on 03/16/2016  . albuterol (PROVENTIL HFA;VENTOLIN HFA) 108 (90 Base) MCG/ACT inhaler Inhale 2 puffs into the lungs every 6 (six) hours as needed for wheezing or shortness of breath.  Marland Kitchen amLODipine (NORVASC) 5 MG tablet TAKE 5 MG BY MOUTH DAILY....need appointment before further refills (Patient taking differently: Take 5 mg by mouth daily. )  . aspirin EC 81 MG EC tablet Take 1 tablet (81 mg total) by mouth daily.  . Choline Fenofibrate (FENOFIBRIC ACID) 135 MG CPDR TAKE ONE CAPSULE BY MOUTH ONCE DAILY (Patient taking differently: TAKE 134m BY MOUTH ONCE DAILY)  . clopidogrel (PLAVIX) 75 MG tablet TAKE ONE TABLET BY MOUTH ONCE DAILY (Patient taking differently: TAKE 793mBY MOUTH ONCE DAILY)  . ferrous sulfate 325 (65 FE) MG tablet Take 325 mg by mouth daily with breakfast.  . losartan (COZAAR) 100 MG tablet TAKE ONE TABLET BY MOUTH ONCE DAILY (Patient taking differently: TAKE 100 mg BY MOUTH ONCE DAILY)  . Multiple Vitamins-Minerals (MULTIVITAMIN WITH MINERALS) tablet Take 1 tablet by mouth daily.   . nitroGLYCERIN (NITROSTAT) 0.4 MG SL tablet Place 1 tablet (0.4 mg total) under the tongue every 5 (five) minutes x 3 doses as needed for chest pain.  . pantoprazole  (PROTONIX) 40 MG tablet TAKE ONE TABLET BY MOUTH ONCE DAILY (Patient taking differently: TAKE 4013mY MOUTH ONCE DAILY)  . pioglitazone-metformin (ACTOPLUS MET) 15-500 MG tablet Take 1 tablet by mouth daily.  . ranolazine (RANEXA) 500 MG 12 hr tablet Take 1 tablet (500 mg total) by mouth 2 (two) times daily.  . sildenafil (REVATIO) 20 MG tablet TAKE 2-5 TABLETS BY MOUTH DAILY  AS NEEDED (Patient taking differently: TAKE 40-100 BY MOUTH DAILY AS NEEDED FOR ERECTILE DYSFUNCTION)    Allergies  Allergen Reactions  . Oxycodone Shortness Of Breath and Cough  . Lisinopril Cough  . Statins Other (See Comments)    MYALGIAS   . Welchol [Colesevelam Hcl] Itching    Social History   Social History  . Marital status: Married    Spouse name: N/A  . Number of children: N/A  . Years of education: N/A   Social History Main Topics  . Smoking status: Former Smoker    Types: Cigarettes    Quit date: 10/05/1983  . Smokeless tobacco: Never Used  . Alcohol use 0.6 oz/week    1 Glasses of wine per week     Comment:  4 oz. wine daily  . Drug use: No  . Sexual activity: Not Currently   Other Topics Concern  . None   Social History Narrative   He is married, father of two, grandfather to 6, great grandfather to two.    Not really getting much exercise now, do to his significant back and hip pain.    He does not smoke and only has an alcoholic beverage.     family history includes Arthritis in his mother; Breast cancer in his sister; Diabetes in his father and sister; Heart disease in his brother, father, and sister; Hypertension in his sister; Stroke in his sister; Ulcers in his brother.  Wt Readings from Last 3 Encounters:  05/06/17 213 lb (96.6 kg)  04/22/17 213 lb 10 oz (96.9 kg)  03/25/17 212 lb (96.2 kg)    PHYSICAL EXAM BP (!) 167/81   Pulse 88   Ht '5\' 9"'  (1.753 m)   Wt 213 lb (96.6 kg)   BMI 31.45 kg/m  Physical Exam  Constitutional: He is oriented to person, place, and time.  He appears well-developed and well-nourished. No distress.  Mildly obese  HENT:  Head: Normocephalic and atraumatic.  Neck: Neck supple. No JVD present. No thyromegaly present.  Cardiovascular: Normal rate, regular rhythm and intact distal pulses.  Exam reveals no gallop and no friction rub.   Murmur (2/6 SEM at RUSB radiates to carotids) heard. No carotid bruit or JVD  Pulmonary/Chest: Effort normal and breath sounds normal. No respiratory distress. He has no wheezes. He has no rales.  Distant breath sounds  Abdominal: Soft. Bowel sounds are normal. He exhibits no distension. There is no tenderness. There is no rebound.  Musculoskeletal: Normal range of motion. He exhibits no edema.  Neurological: He is alert and oriented to person, place, and time.  Skin: Skin is warm and dry. No rash noted. No erythema.  Psychiatric: He has a normal mood and affect. His behavior is normal. Judgment and thought content normal.  Nursing note and vitals reviewed.   Adult ECG Report  n/a  Other studies Reviewed: Additional studies/ records that were reviewed today include:  Recent Labs:   Lab Results  Component Value Date   CHOL 172 11/09/2016   HDL 49 11/09/2016   LDLCALC 106 (H) 11/09/2016   TRIG 85 11/09/2016   CHOLHDL 3.5 11/09/2016   Lab Results  Component Value Date   CREATININE 1.38 (H) 04/21/2017   BUN 18 04/21/2017   NA 137 04/21/2017   K 3.9 04/21/2017   CL 110 04/21/2017   CO2 21 (L) 04/21/2017    ASSESSMENT / PLAN: Problem List Items Addressed This Visit    CAD S/P CABG '95- several PCis since,  last 05/17/14 (Chronic)    Unfortunately, his MI is related to the more proximal portion of the LAD, and it was felt this is probably not a good target for PCI. Basically, not having any more anginal symptoms. He was started on Ranexa. Continued on amlodipine and ARB. He is back on aspirin plus Plavix. Is not on beta blocker because of COPD and bradycardia.  Not on statin due to  intolerance. He is on fenofibrate.      Relevant Medications   ezetimibe (ZETIA) 10 MG tablet   Chronic diastolic CHF (congestive heart failure) (HCC) (Chronic)   Relevant Medications   ezetimibe (ZETIA) 10 MG tablet   Hyperlipidemia LDL goal <70; statin intolerant - Primary (Chronic)    I discussed with our pharmacy team and CV RRR. Plan for now is to have him go back on Zetia along with fenofibrate, then they will follow-up with him to determine whether or not he needs to go on to PCSK9 inhibitor. I also asked that they monitor his blood pressure to see if he is indeed maintaining SBP numbers in the 120-130 mmHg range.  If not, can titrate up amlodipine.      Relevant Medications   ezetimibe (ZETIA) 10 MG tablet   Other Relevant Orders   Lipid panel   Hepatic function panel   Hypertensive heart disease with chronic diastolic congestive heart failure (HCC) (Chronic)    He has lost any COPD but also has some diastolic dysfunction component of dyspnea. He is on high-dose ARB and moderate dose amlodipine. Not on any diuretic as he has no PND or orthopnea. Plan: Follow blood pressure as he comes in for lipid management with CV RR -> low threshold to titrate amlodipine 10 mg daily for both blood pressure and antianginal effect.      Relevant Medications   ezetimibe (ZETIA) 10 MG tablet   NSTEMI (non-ST elevated myocardial infarction) (HCC) (Chronic)    Occluded proximal LAD with now no SP1 perfusion antegrade. The diagonal branch does appear to be perfused retrograde through LIMA graft. No further anginal symptoms at this point. An echo was not checked as LV Gram showed stable EF.  Gradually getting back into his routine activities. We discussed normal recovery recommendations - he did not indicate an interest in Cardiac Rehab.      Relevant Medications   ezetimibe (ZETIA) 10 MG tablet   Obesity (BMI 30-39.9) (Chronic)    Weight has been stable. Hopefully now once he gets over the  endoleak issues in this recent non-STEMI, he can get him back to exercising and he can lose more weight.      Presence of drug coated stent in right coronary artery and circumflex coronary artery (Chronic)    Patent stents in the RCA and circumflex. He is now back on aspirin and Plavix given his recent non-STEMI.       Other Visit Diagnoses    Medication management       Relevant Orders   Lipid panel   Hepatic function panel      Current medicines are reviewed at length with the patient today. (+/- concerns) None The following changes have been made: See below  Patient Instructions  MEDICATION ADDITION  START GENERIC ZETA 10 MG ONE TABLET DAILY   IN 6 WEEKS HAVE LABS DONE LIPID , HEPATIC  - [facility]   ( HERE AT THIS OFFICE)- DO NOT EAT OR DRINK THE MORNING OF THE TEST   Your physician recommends that  you schedule a follow-up appointment in 7-8 WEEKS WITH CVRR - CHOLESTEROL, BLOOD PRESSURE.   Your physician wants you to follow-up in Dec 2018 with DR Quantay Zaremba.     Studies Ordered:   Orders Placed This Encounter  Procedures  . Lipid panel  . Hepatic function panel      Glenetta Hew, M.D., M.S. Interventional Cardiologist   Pager # [phone] Phone # [phone] [address] [place], [place] [address]

## 2017-05-06 NOTE — Patient Instructions (Signed)
MEDICATION ADDITION  START GENERIC ZETA 10 MG ONE TABLET DAILY   IN 6 WEEKS HAVE LABS DONE LIPID , HEPATIC  - [facility]   ( HERE AT THIS OFFICE)- DO NOT EAT OR DRINK THE MORNING OF THE TEST   Your physician recommends that you schedule a follow-up appointment in 7-8 WEEKS WITH CVRR - CHOLESTEROL, BLOOD PRESSURE.   Your physician wants you to follow-up in Dec 2018 with DR HARDING.

## 2017-05-08 ENCOUNTER — Encounter: Payer: Self-pay | Admitting: Cardiology

## 2017-05-08 NOTE — Assessment & Plan Note (Signed)
Unfortunately, his MI is related to the more proximal portion of the LAD, and it was felt this is probably not a good target for PCI. Basically, not having any more anginal symptoms. He was started on Ranexa. Continued on amlodipine and ARB. He is back on aspirin plus Plavix. Is not on beta blocker because of COPD and bradycardia.  Not on statin due to intolerance. He is on fenofibrate.

## 2017-05-08 NOTE — Assessment & Plan Note (Signed)
Weight has been stable. Hopefully now once he gets over the endoleak issues in this recent non-STEMI, he can get him back to exercising and he can lose more weight.

## 2017-05-08 NOTE — Assessment & Plan Note (Signed)
Occluded proximal LAD with now no SP1 perfusion antegrade. The diagonal branch does appear to be perfused retrograde through LIMA graft. No further anginal symptoms at this point. An echo was not checked as LV Gram showed stable EF.  Gradually getting back into his routine activities. We discussed normal recovery recommendations - he did not indicate an interest in Cardiac Rehab.

## 2017-05-08 NOTE — Assessment & Plan Note (Signed)
I discussed with our pharmacy team and CV RRR. Plan for now is to have him go back on Zetia along with fenofibrate, then they will follow-up with him to determine whether or not he needs to go on to PCSK9 inhibitor. I also asked that they monitor his blood pressure to see if he is indeed maintaining SBP numbers in the 120-130 mmHg range.  If not, can titrate up amlodipine.

## 2017-05-08 NOTE — Assessment & Plan Note (Signed)
He has lost any COPD but also has some diastolic dysfunction component of dyspnea. He is on high-dose ARB and moderate dose amlodipine. Not on any diuretic as he has no PND or orthopnea. Plan: Follow blood pressure as he comes in for lipid management with CV RR -> low threshold to titrate amlodipine 10 mg daily for both blood pressure and antianginal effect.

## 2017-05-08 NOTE — Assessment & Plan Note (Signed)
Patent stents in the RCA and circumflex. He is now back on aspirin and Plavix given his recent non-STEMI.

## 2017-06-13 ENCOUNTER — Other Ambulatory Visit: Payer: Self-pay | Admitting: Family Medicine

## 2017-06-15 ENCOUNTER — Telehealth: Payer: Self-pay | Admitting: Family Medicine

## 2017-06-15 ENCOUNTER — Telehealth: Payer: Self-pay

## 2017-06-15 MED ORDER — GLUCOSE BLOOD VI STRP: ORAL_STRIP | 2 refills | Status: DC

## 2017-06-15 NOTE — Telephone Encounter (Signed)
Talked to [name] she will handle

## 2017-06-15 NOTE — Telephone Encounter (Signed)
Pharmacy faxed requesting new rx for test strips to be sent to [facility] including ICD-10. Updated rx sent. Victorino December

## 2017-06-20 DIAGNOSIS — Z79899 Other long term (current) drug therapy: Secondary | ICD-10-CM | POA: Diagnosis not present

## 2017-06-20 DIAGNOSIS — E785 Hyperlipidemia, unspecified: Secondary | ICD-10-CM | POA: Diagnosis not present

## 2017-06-21 LAB — HEPATIC FUNCTION PANEL
ALK PHOS: 33 IU/L — AB (ref 39–117)
ALT: 21 IU/L (ref 0–44)
AST: 27 IU/L (ref 0–40)
Albumin: 4 g/dL (ref 3.5–4.8)
BILIRUBIN TOTAL: 0.5 mg/dL (ref 0.0–1.2)
BILIRUBIN, DIRECT: 0.22 mg/dL (ref 0.00–0.40)
TOTAL PROTEIN: 6.5 g/dL (ref 6.0–8.5)

## 2017-06-21 LAB — LIPID PANEL
CHOLESTEROL TOTAL: 158 mg/dL (ref 100–199)
Chol/HDL Ratio: 3.6 ratio (ref 0.0–5.0)
HDL: 44 mg/dL (ref >39)
LDL Calculated: 96 mg/dL (ref 0–99)
Triglycerides: 92 mg/dL (ref 0–149)
VLDL CHOLESTEROL CAL: 18 mg/dL (ref 5–40)

## 2017-06-23 ENCOUNTER — Ambulatory Visit (INDEPENDENT_AMBULATORY_CARE_PROVIDER_SITE_OTHER): Payer: Medicare Other | Admitting: Pharmacist

## 2017-06-23 VITALS — BP 136/68 | HR 74

## 2017-06-23 DIAGNOSIS — Z9861 Coronary angioplasty status: Secondary | ICD-10-CM | POA: Diagnosis not present

## 2017-06-23 DIAGNOSIS — E785 Hyperlipidemia, unspecified: Secondary | ICD-10-CM

## 2017-06-23 DIAGNOSIS — I5032 Chronic diastolic (congestive) heart failure: Secondary | ICD-10-CM

## 2017-06-23 DIAGNOSIS — I2510 Atherosclerotic heart disease of native coronary artery without angina pectoris: Secondary | ICD-10-CM

## 2017-06-23 DIAGNOSIS — I110 Hypertensive heart disease with heart failure: Secondary | ICD-10-CM

## 2017-06-23 NOTE — Progress Notes (Signed)
Patient ID: [name]                 DOB: 1935/12/12                      MRN: [id number]     HPI: [name] is a 81 y.o. male referred by Dr. Ellyn Hack to HTN and lipid clinic.  PMH includes CAD s/p placement, AAA s/p repair,CABGx2, hypertension.  Patient presents today for HTN evaluation and potential PCSK9i initiation. Denies chest pain, shortness of breath, dizziness, headaches or muscle pain. Expressed compliance with current prescribed therapy.  Current HTN meds:  Amlodipine 46m daily Losartan 1085mdaily  Current lipid meds: Fenofibrate 13557maily Ezetimibe 64m51mily  Intolerance:  Simvastatin 40mg64mly - muscle pain and spasms legs and back Rosuvastatin 20mg 72my - severe muscle pain Rosuvastatin 64mg d5m - severe leg paiun  BP goal: <130/80  LDL goal:<70 mg/dL  Family History: family history includes Arthritis in his mother; Breast cancer in his sister; Diabetes in his father and sister; Heart disease in his brother, father, and sister; Hypertension in his sister; Stroke in his sister  Social History: former smoker, drink 1 cup of wine daily,   Diet: likes to eat eggs and sausage most days for breakfast, occassionally alterantes with corn flakes or oatmeal. Mostly home cooked meals  Exercise: yard work. No a lot of extra activity due to back problems  Home BP readings: none available today to assessment  Relevant Labs: CHO 158; TG 92; HDL 44; LDL 96 (fenofibrate plus ezetimibe) 06/20/2017  Wt Readings from Last 3 Encounters:  05/06/17 213 lb (96.6 kg)  04/22/17 213 lb 10 oz (96.9 kg)  03/25/17 212 lb (96.2 kg)   BP Readings from Last 3 Encounters:  06/23/17 136/68  05/06/17 (!) 167/81  04/22/17 125/64   Pulse Readings from Last 3 Encounters:  06/23/17 74  05/06/17 88  04/22/17 63    Renal function: CrCl cannot be calculated (Patient's most recent lab result is older than the maximum 21 days allowed.).  Past Medical History:  Diagnosis  Date  . Adenomatous colon polyp   . Ankle edema    Chronic  . Asthma    as a child  . CAD S/P percutaneous coronary angioplasty 06/2003, 12/2006, 05/2014   a) '04: Staged Taxus DES PCI to RCA and Cx-OM;;'08 - PCI to proximal ISR in RCA - Cypher DES; c) 05/2014: ISR in RCA & OM1, PCI with Promus P DES: RCA  2.75 x 12, OM1 3.0 mm x 8)  . CAD, multiple vessel 1985-2010   Most recent cath 01/01/09: 100% Occluded LAD after SP1, patent LIMA-distal LAD with apical 90% lesion.  Cx-OM1 widely patent stent into proximal bifurcating OM1 . Follow-on Cx => 70-80% -- non-amenable PCI. Widely patent 3 overlapping stents in midRCA with only 40% RPL stenosis; SVG- OM known to be occluded.  . Cholelithiasis - with cholangitis & choledocholithiasis    status post ERCP with removal of calculi and biliary stent placement.  . Chronic anemia    On iron supplement; history of positive guaiac - negative colonoscopy in 1996.; Thought to be related to hemorrhoids; status post hemorrhoidectomy  . Chronic back pain     multiple surgeries; C-spine and lumbar  . Diabetes mellitus    on oral medication  . Diverticulitis of colon 1996  . Diverticulosis   . Dyslipidemia, goal LDL below 70   . Erectile dysfunction   .  Exertional dyspnea    Chronic baseline SOB with ambulation  . GERD (gastroesophageal reflux disease)   . H/O: pneumonia February 14  . Hemorrhoids   . Hiatal hernia   . History of: ST elevation myocardial infarction (STEMI) involving left circumflex coronary artery with complication 4562   PTCA-circumflex; PCI in 1991  . Hypertension   . Moderate aortic stenosis by prior echocardiogram 07/2015   Normal LV function - EF 55-60%.. Abnormal relaxation. Mild-moderate aortic stenosis (peak/mean gradient 18/10 mmH)  . Osteoarthritis of both knees    And back; multiple back surgeries, right knee arthroplasty and left knee arthroscopic surgery x2  . Pneumonia 2016  . S/P AAA (abdominal aortic aneurysm) repair  08/30/2012   s/p EVAR  . S/P CABG x 2 1997   LIMA-LAD, SVG-OM  . Sleep apnea    pt. states he was told to return for f/u, to be fitted for Cpap, but pt. reports that he didn't follow up-no cpap used    Current Outpatient Prescriptions on File Prior to Visit  Medication Sig Dispense Refill  . acetaminophen (TYLENOL) 500 MG tablet Take 1,000 mg by mouth every 6 (six) hours as needed for mild pain, moderate pain or headache. Reported on 03/16/2016    . albuterol (PROVENTIL HFA;VENTOLIN HFA) 108 (90 Base) MCG/ACT inhaler Inhale 2 puffs into the lungs every 6 (six) hours as needed for wheezing or shortness of breath. 1 Inhaler 0  . amLODipine (NORVASC) 5 MG tablet TAKE 5 MG BY MOUTH DAILY....need appointment before further refills (Patient taking differently: Take 5 mg by mouth daily. ) 90 tablet 0  . aspirin EC 81 MG EC tablet Take 1 tablet (81 mg total) by mouth daily. 30 tablet 11  . Choline Fenofibrate (FENOFIBRIC ACID) 135 MG CPDR TAKE ONE CAPSULE BY MOUTH ONCE DAILY (Patient taking differently: TAKE 142m BY MOUTH ONCE DAILY) 90 capsule 1  . clopidogrel (PLAVIX) 75 MG tablet TAKE ONE TABLET BY MOUTH ONCE DAILY (Patient taking differently: TAKE 775mBY MOUTH ONCE DAILY) 90 tablet 3  . ezetimibe (ZETIA) 10 MG tablet Take 1 tablet (10 mg total) by mouth daily. 30 tablet 6  . ferrous sulfate 325 (65 FE) MG tablet Take 325 mg by mouth daily with breakfast.    . glucose blood (ONETOUCH VERIO) test strip USE ONE STRIP TO CHECK GLUCOSE TWICE DAILY E11.9 180 each 2  . losartan (COZAAR) 100 MG tablet TAKE ONE TABLET BY MOUTH ONCE DAILY (Patient taking differently: TAKE 100 mg BY MOUTH ONCE DAILY) 90 tablet 1  . Multiple Vitamins-Minerals (MULTIVITAMIN WITH MINERALS) tablet Take 1 tablet by mouth daily.     . nitroGLYCERIN (NITROSTAT) 0.4 MG SL tablet Place 1 tablet (0.4 mg total) under the tongue every 5 (five) minutes x 3 doses as needed for chest pain. 25 tablet 12  . pantoprazole (PROTONIX) 40 MG  tablet TAKE ONE TABLET BY MOUTH ONCE DAILY (Patient taking differently: TAKE 4037mY MOUTH ONCE DAILY) 90 tablet 3  . pioglitazone-metformin (ACTOPLUS MET) 15-500 MG tablet Take 1 tablet by mouth daily. 90 tablet 0  . ranolazine (RANEXA) 500 MG 12 hr tablet Take 1 tablet (500 mg total) by mouth 2 (two) times daily. 60 tablet 5  . sildenafil (REVATIO) 20 MG tablet TAKE 2-5 TABLETS BY MOUTH DAILY AS NEEDED (Patient taking differently: TAKE 40-100 BY MOUTH DAILY AS NEEDED FOR ERECTILE DYSFUNCTION) 50 tablet 5   No current facility-administered medications on file prior to visit.     Allergies  Allergen Reactions  . Oxycodone Shortness Of Breath and Cough  . Lisinopril Cough  . Statins Other (See Comments)    MYALGIAS   . Welchol [Colesevelam Hcl] Itching    Blood pressure 136/68, pulse 74.  Hypertensive heart disease with chronic diastolic congestive heart failure ([place]) Blood pressure well controlled today. Patient reports compliance with current therapy and denies ADRs. Will continue current therapy of amlodipine 50m and losartan 1042mwithout changes and follow up with HTN clinic as needed.  Hyperlipidemia LDL goal <70; statin intolerant LDL remains above goal for secondary prevention. Patient main concern about PCSK9i therapy is cost. Indication, mode of action, dose, common side effects, storage, administration, insurance pre-approval process, co-pay and patient assistance options for Repatha and Praluent were discussed with patient during office visit. All questions were answered as well. Will initiate pre-approval process for Repatha SureClick [id number] 14 days. Patient okay Praluent pre-approval process if Repatha not covered by insurance. Paperwork for both patient assistance programs were provided to patient, he will complete at home and bring to the office is needed.  Estimate time  to complete approval process 6-8 week if not appeal needed.    [name] PharmD,  BCPS, CPDickerson City[address] [id number]/21/2018 10:20 AM

## 2017-06-23 NOTE — Patient Instructions (Addendum)
[name] / Erasmo Downer (Pharmacist)   Return for a a follow up appointment as needed  Your blood pressure today is 136/68 pulse 74  Check your blood pressure at home daily (if able) and keep record of the readings.  Take your BP meds as follows: **ALL medication as previously prescribed*  Bring all of your meds, your BP cuff and your record of home blood pressures to your next appointment.  Exercise as you're able, try to walk approximately 30 minutes per day.  Keep salt intake to a minimum, especially watch canned and prepared boxed foods.  Eat more fresh fruits and vegetables and fewer canned items.  Avoid eating in fast food restaurants.    HOW TO TAKE YOUR BLOOD PRESSURE: . Rest 5 minutes before taking your blood pressure. .  Don't smoke or drink caffeinated beverages for at least 30 minutes before. . Take your blood pressure before (not after) you eat. . Sit comfortably with your back supported and both feet on the floor (don't cross your legs). . Elevate your arm to heart level on a table or a desk. . Use the proper sized cuff. It should fit smoothly and snugly around your bare upper arm. There should be enough room to slip a fingertip under the cuff. The bottom edge of the cuff should be 1 inch above the crease of the elbow. . Ideally, take 3 measurements at one sitting and record the average.      Cholesterol Cholesterol is a fat. Your body needs a small amount of cholesterol. Cholesterol (plaque) may build up in your blood vessels (arteries). That makes you more likely to have a heart attack or stroke. You cannot feel your cholesterol level. Having a blood test is the only way to find out if your level is high. Keep your test results. Work with your doctor to keep your cholesterol at a good level. What do the results mean?  Total cholesterol is how much cholesterol is in your blood.  LDL is bad cholesterol. This is the type that can build up. Try to have low LDL.  HDL is good  cholesterol. It cleans your blood vessels and carries LDL away. Try to have high HDL.  Triglycerides are fat that the body can store or burn for energy. What are good levels of cholesterol?  Total cholesterol below 200.  LDL below 100 is good for people who have health risks. LDL below 70 is good for people who have very high risks.  HDL above 40 is good. It is best to have HDL of 60 or higher.  Triglycerides below 150. How can I lower my cholesterol? Diet Follow your diet program as told by your doctor.  Choose fish, white meat chicken, or [place] that is roasted or baked. Try not to eat red meat, fried foods, sausage, or lunch meats.  Eat lots of fresh fruits and vegetables.  Choose whole grains, beans, pasta, potatoes, and cereals.  Choose olive oil, corn oil, or canola oil. Only use small amounts.  Try not to eat butter, mayonnaise, shortening, or palm kernel oils.  Try not to eat foods with trans fats.  Choose low-fat or nonfat dairy foods. ? Drink skim or nonfat milk. ? Eat low-fat or nonfat yogurt and cheeses. ? Try not to drink whole milk or cream. ? Try not to eat ice cream, egg yolks, or full-fat cheeses.  Healthy desserts include angel food cake, ginger snaps, animal crackers, hard candy, popsicles, and low-fat or nonfat frozen yogurt.  Try not to eat pastries, cakes, pies, and cookies.  Exercise Follow your exercise program as told by your doctor.  Be more active. Try gardening, walking, and taking the stairs.  Ask your doctor about ways that you can be more active.  Medicine  Take over-the-counter and prescription medicines only as told by your doctor.  This information is not intended to replace advice given to you by your health care provider. Make sure you discuss any questions you have with your health care provider. Document Released: 12/17/2008 Document Revised: 04/21/2016 Document Reviewed: 04/01/2016 Elsevier Interactive Patient Education  2017  [employer].

## 2017-06-24 MED ORDER — EVOLOCUMAB 140 MG/ML ~~LOC~~ SOAJ: 140.0000 mg | SUBCUTANEOUS | 11 refills | Status: DC

## 2017-06-24 NOTE — Assessment & Plan Note (Signed)
LDL remains above goal for secondary prevention. Patient main concern about PCSK9i therapy is cost. Indication, mode of action, dose, common side effects, storage, administration, insurance pre-approval process, co-pay and patient assistance options for Repatha and Praluent were discussed with patient during office visit. All questions were answered as well. Will initiate pre-approval process for Repatha SureClick 140mg  every 14 days. Patient okay Praluent pre-approval process if Repatha not covered by insurance. Paperwork for both patient assistance programs were provided to patient, he will complete at home and bring to the office is needed.  Estimate time  to complete approval process 6-8 week if not appeal needed.

## 2017-06-24 NOTE — Assessment & Plan Note (Signed)
Blood pressure well controlled today. Patient reports compliance with current therapy and denies ADRs. Will continue current therapy of amlodipine 5mg  and losartan 100mg  without changes and follow up with HTN clinic as needed.

## 2017-06-28 ENCOUNTER — Other Ambulatory Visit: Payer: Self-pay | Admitting: Family Medicine

## 2017-07-20 ENCOUNTER — Ambulatory Visit (INDEPENDENT_AMBULATORY_CARE_PROVIDER_SITE_OTHER): Payer: Medicare Other | Admitting: Family Medicine

## 2017-07-20 ENCOUNTER — Encounter: Payer: Self-pay | Admitting: Family Medicine

## 2017-07-20 VITALS — BP 140/80 | HR 80 | Ht 69.0 in | Wt 216.4 lb

## 2017-07-20 DIAGNOSIS — Z23 Encounter for immunization: Secondary | ICD-10-CM

## 2017-07-20 DIAGNOSIS — E118 Type 2 diabetes mellitus with unspecified complications: Secondary | ICD-10-CM

## 2017-07-20 DIAGNOSIS — D692 Other nonthrombocytopenic purpura: Secondary | ICD-10-CM | POA: Diagnosis not present

## 2017-07-20 DIAGNOSIS — D649 Anemia, unspecified: Secondary | ICD-10-CM | POA: Diagnosis not present

## 2017-07-20 DIAGNOSIS — N529 Male erectile dysfunction, unspecified: Secondary | ICD-10-CM

## 2017-07-20 DIAGNOSIS — Z789 Other specified health status: Secondary | ICD-10-CM

## 2017-07-20 DIAGNOSIS — Z96653 Presence of artificial knee joint, bilateral: Secondary | ICD-10-CM

## 2017-07-20 DIAGNOSIS — E785 Hyperlipidemia, unspecified: Secondary | ICD-10-CM

## 2017-07-20 LAB — CBC WITH DIFFERENTIAL/PLATELET
BASOS ABS: 31 {cells}/uL (ref 0–200)
Basophils Relative: 0.7 %
EOS ABS: 31 {cells}/uL (ref 15–500)
EOS PCT: 0.7 %
HEMATOCRIT: 33 % — AB (ref 38.5–50.0)
HEMOGLOBIN: 11 g/dL — AB (ref 13.2–17.1)
LYMPHS ABS: 1272 {cells}/uL (ref 850–3900)
MCH: 31.4 pg (ref 27.0–33.0)
MCHC: 33.3 g/dL (ref 32.0–36.0)
MCV: 94.3 fL (ref 80.0–100.0)
MONOS PCT: 9.8 %
MPV: 12.1 fL (ref 7.5–12.5)
NEUTROS ABS: 2636 {cells}/uL (ref 1500–7800)
Neutrophils Relative %: 59.9 %
Platelets: 214 10*3/uL (ref 140–400)
RBC: 3.5 10*6/uL — ABNORMAL LOW (ref 4.20–5.80)
RDW: 14.2 % (ref 11.0–15.0)
Total Lymphocyte: 28.9 %
WBC mixed population: 431 cells/uL (ref 200–950)
WBC: 4.4 10*3/uL (ref 3.8–10.8)

## 2017-07-20 LAB — POCT GLYCOSYLATED HEMOGLOBIN (HGB A1C): Hemoglobin A1C: 4.9

## 2017-07-20 LAB — POCT UA - MICROALBUMIN
Albumin/Creatinine Ratio, Urine, POC: <6.9
CREATININE, POC: 72.5 mg/dL
Microalbumin Ur, POC: <5 mg/L

## 2017-07-20 MED ORDER — METFORMIN HCL 500 MG PO TABS: 500.0000 mg | ORAL_TABLET | Freq: Every day | ORAL | 1 refills | Status: DC

## 2017-07-20 NOTE — Progress Notes (Signed)
Subjective:    Patient ID: [name], male    DOB: 1936-08-08, 81 y.o.   MRN: [id number]  [name] is a 81 y.o. male who presents for follow-up of Type 2 diabetes mellitus.  Home blood sugar records: fasting range: 80 to 114 Current symptoms/problems include none and have been unchanged. Daily foot checks:   Any foot concerns: none Exercise: The patient does not participate in regular exercise at present. Diet:Regular. He has been having difficulty with erectile dysfunction and is involved in a program that so far has been unsuccessful. He has tried shots as well as a pump and does not find them useful. He did have blood testing done which did show testosterone of 611 and a PSA of 7.4. He continues onz/metformin. He also is taking iron supplementation. Continues on Zetia and does have a statin intolerance. He is also using Ranexa, losartan, Plavix. He does take Protonix. The following portions of the patient's history were reviewed and updated as appropriate: allergies, current medications, past medical history, past social history and problem list.  ROS as in subjective above.     Objective:    Physical Exam Alert and in no distress. Alert and in no distress. Tympanic membranes and canals are normal. Pharyngeal area is normal. Neck is supple without adenopathy or thyromegaly. Cardiac exam shows a regular sinus rhythm with 1/6 SEM no gallops. Lungs are clear to auscultation.Purpuric lesions noted on the arms.   Blood pressure 140/80, pulse 80, height 5\' 9"  (1.753 m), weight 216 lb 6.4 oz (98.2 kg).  Lab Review Diabetic Labs Latest Ref Rng & Units 07/20/2017 06/20/2017 04/21/2017 04/20/2017 01/18/2017  HbA1c - 4.9 - - - -  Microalbumin mg/L <5.0 - - - -  Micro/Creat Ratio - <6.9 - - - -  Chol 100 - 199 mg/dL - 158 - - -  HDL >39 mg/dL - 44 - - -  Calc LDL 0 - 99 mg/dL - 96 - - -  Triglycerides 0 - 149 mg/dL - 92 - - -  Creatinine 0.61 - 1.24 mg/dL - - 1.38(H) 1.43(H) 1.54(H)    GFR >60.00 mL/min - - - - -   BP/Weight 07/20/2017 06/23/2017 05/06/2017 04/22/2017 06/01/5620  Systolic BP 308 657 846 962 -  Diastolic BP 80 68 81 64 -  Wt. (Lbs) 216.4 - 213 213.63 -  BMI 31.96 - 31.45 - 31.55   Foot/eye exam completion dates Latest Ref Rng & Units 07/20/2017 02/22/2017  Eye Exam No Retinopathy - No Retinopathy  Foot Form Completion - Done -  A1c 4.9   [name]  reports that he quit smoking about 33 years ago. His smoking use included Cigarettes. He has never used smokeless tobacco. He reports that he drinks about 0.6 oz of alcohol per week . He reports that he does not use drugs.     Assessment & Plan:    Type 2 diabetes mellitus with complication, without long-term current use of insulin (HCC) - Plan: HgB A1c, POCT UA - Microalbumin, metFORMIN (GLUCOPHAGE) 500 MG tablet  Need for influenza vaccination - Plan: Flu vaccine HIGH DOSE PF (Fluzone High dose)  Hyperlipidemia LDL goal <70; statin intolerant  Status post total bilateral knee replacement  Statin intolerance  ED (erectile dysfunction) of organic origin  Anemia, unspecified type - Plan: CBC with Differential/Platelet  Senile purpura ([place])  1. Rx changes: Stop Actos plus and switch to metformin 500 mg daily 2. Education: Reviewed 'ABCs' of diabetes management (respective goals in  parentheses):  A1C (<7), blood pressure (<130/80), and cholesterol (LDL <100). 3. Compliance at present is estimated to be good. Efforts to improve compliance (if necessary) will be directed at increased exercise. 4. Follow up: 4 months  I discussed the skin lesions with him and explained that these are very common. I then discussed the PSA with him and explained that at his age, further evaluation of that would be inappropriate. Also recommend he go to the pharmacy to get both the Shingrix and  TDaP shots. He will continue to go to the [facility] clinic for his erectile dysfunction. Also check CBC due to his anemia.

## 2017-07-20 NOTE — Patient Instructions (Addendum)
Try taking the heartburn medicine every other day and even every third day to keep your heartburn under control Stop the Actos plus(pioglitazone/metformin) and start taking the metformin

## 2017-07-21 ENCOUNTER — Telehealth: Payer: Self-pay | Admitting: Pharmacist

## 2017-07-21 NOTE — Telephone Encounter (Signed)
Called patient today. He is to bring complete form for [employer] tomorrow.  Repatha was pre-approved by insurance with copay of $410/month

## 2017-09-07 ENCOUNTER — Ambulatory Visit (INDEPENDENT_AMBULATORY_CARE_PROVIDER_SITE_OTHER): Payer: Medicare Other | Admitting: Medical

## 2017-09-07 ENCOUNTER — Encounter: Payer: Self-pay | Admitting: Medical

## 2017-09-07 VITALS — BP 134/84 | HR 71 | Temp 97.9°F | Wt 219.4 lb

## 2017-09-07 DIAGNOSIS — Z9861 Coronary angioplasty status: Secondary | ICD-10-CM | POA: Diagnosis not present

## 2017-09-07 DIAGNOSIS — I2510 Atherosclerotic heart disease of native coronary artery without angina pectoris: Secondary | ICD-10-CM | POA: Diagnosis not present

## 2017-09-07 DIAGNOSIS — J988 Other specified respiratory disorders: Secondary | ICD-10-CM

## 2017-09-07 MED ORDER — AZITHROMYCIN 250 MG PO TABS: ORAL_TABLET | ORAL | 0 refills | Status: DC

## 2017-09-07 NOTE — Progress Notes (Signed)
Subjective: Chief Complaint  Patient presents with  . cold congestion    runny nose, some sore throat, congestion , some coughing , started yesterday    Here for cough, cold, has had some runny nose, sore throat, cough, congestion.  Wife has had cough for 2 weeks.   Has tingling and soreness in throat worse today.  No fever.   No NVD.  No wheezing, no SOB.   Using nothing for symptoms.  Wife is seeing doctor today.  Has hx/o asthma, rarely has to use inhaler.  Not using inhaler currently.  No other aggravating or relieving factors. No other complaint.  Past Medical History:  Diagnosis Date  . Adenomatous colon polyp   . Ankle edema    Chronic  . Asthma    as a child  . CAD S/P percutaneous coronary angioplasty 06/2003, 12/2006, 05/2014   a) '04: Staged Taxus DES PCI to RCA and Cx-OM;;'08 - PCI to proximal ISR in RCA - Cypher DES; c) 05/2014: ISR in RCA & OM1, PCI with Promus P DES: RCA  2.75 x 12, OM1 3.0 mm x 8)  . CAD, multiple vessel 1985-2010   Most recent cath 01/01/09: 100% Occluded LAD after SP1, patent LIMA-distal LAD with apical 90% lesion.  Cx-OM1 widely patent stent into proximal bifurcating OM1 . Follow-on Cx => 70-80% -- non-amenable PCI. Widely patent 3 overlapping stents in midRCA with only 40% RPL stenosis; SVG- OM known to be occluded.  . Cholelithiasis - with cholangitis & choledocholithiasis    status post ERCP with removal of calculi and biliary stent placement.  . Chronic anemia    On iron supplement; history of positive guaiac - negative colonoscopy in 1996.; Thought to be related to hemorrhoids; status post hemorrhoidectomy  . Chronic back pain     multiple surgeries; C-spine and lumbar  . Diabetes mellitus    on oral medication  . Diverticulitis of colon 1996  . Diverticulosis   . Dyslipidemia, goal LDL below 70   . Erectile dysfunction   . Exertional dyspnea    Chronic baseline SOB with ambulation  . GERD (gastroesophageal reflux disease)   . H/O: pneumonia  February 14  . Hemorrhoids   . Hiatal hernia   . History of: ST elevation myocardial infarction (STEMI) involving left circumflex coronary artery with complication 3875   PTCA-circumflex; PCI in 1991  . Hypertension   . Moderate aortic stenosis by prior echocardiogram 07/2015   Normal LV function - EF 55-60%.. Abnormal relaxation. Mild-moderate aortic stenosis (peak/mean gradient 18/10 mmH)  . Osteoarthritis of both knees    And back; multiple back surgeries, right knee arthroplasty and left knee arthroscopic surgery x2  . Pneumonia 2016  . S/P AAA (abdominal aortic aneurysm) repair 08/30/2012   s/p EVAR  . S/P CABG x 2 1997   LIMA-LAD, SVG-OM  . Sleep apnea    pt. states he was told to return for f/u, to be fitted for Cpap, but pt. reports that he didn't follow up-no cpap used   Current Outpatient Medications on File Prior to Visit  Medication Sig Dispense Refill  . acetaminophen (TYLENOL) 500 MG tablet Take 1,000 mg by mouth every 6 (six) hours as needed for mild pain, moderate pain or headache. Reported on 03/16/2016    . albuterol (PROVENTIL HFA;VENTOLIN HFA) 108 (90 Base) MCG/ACT inhaler Inhale 2 puffs into the lungs every 6 (six) hours as needed for wheezing or shortness of breath. 1 Inhaler 0  . amLODipine (NORVASC) 5  MG tablet TAKE 5 MG BY MOUTH DAILY....need appointment before further refills (Patient taking differently: Take 5 mg by mouth daily. ) 90 tablet 0  . aspirin EC 81 MG EC tablet Take 1 tablet (81 mg total) by mouth daily. 30 tablet 11  . Choline Fenofibrate (FENOFIBRIC ACID) 135 MG CPDR TAKE ONE CAPSULE BY MOUTH ONCE DAILY (Patient taking differently: TAKE 135mg  BY MOUTH ONCE DAILY) 90 capsule 1  . clopidogrel (PLAVIX) 75 MG tablet TAKE ONE TABLET BY MOUTH ONCE DAILY (Patient taking differently: TAKE 75mg  BY MOUTH ONCE DAILY) 90 tablet 3  . ferrous sulfate 325 (65 FE) MG tablet Take 325 mg by mouth daily with breakfast.    . glucose blood (ONETOUCH VERIO) test strip USE  ONE STRIP TO CHECK GLUCOSE TWICE DAILY E11.9 180 each 2  . losartan (COZAAR) 100 MG tablet TAKE ONE TABLET BY MOUTH ONCE DAILY (Patient taking differently: TAKE 100 mg BY MOUTH ONCE DAILY) 90 tablet 1  . metFORMIN (GLUCOPHAGE) 500 MG tablet Take 1 tablet (500 mg total) by mouth daily with breakfast. 90 tablet 1  . Multiple Vitamins-Minerals (MULTIVITAMIN WITH MINERALS) tablet Take 1 tablet by mouth daily.     . nitroGLYCERIN (NITROSTAT) 0.4 MG SL tablet Place 1 tablet (0.4 mg total) under the tongue every 5 (five) minutes x 3 doses as needed for chest pain. 25 tablet 12  . sildenafil (REVATIO) 20 MG tablet TAKE 2-5 TABLETS BY MOUTH DAILY AS NEEDED (Patient taking differently: TAKE 40-100 BY MOUTH DAILY AS NEEDED FOR ERECTILE DYSFUNCTION) 50 tablet 5  . Evolocumab (REPATHA SURECLICK) 016 MG/ML SOAJ Inject 140 mg into the skin every 14 (fourteen) days. (Patient not taking: Reported on 09/07/2017) 2 pen 11  . ezetimibe (ZETIA) 10 MG tablet Take 1 tablet (10 mg total) by mouth daily. 30 tablet 6  . pantoprazole (PROTONIX) 40 MG tablet TAKE ONE TABLET BY MOUTH ONCE DAILY (Patient taking differently: TAKE 40mg  BY MOUTH ONCE DAILY) 90 tablet 3  . ranolazine (RANEXA) 500 MG 12 hr tablet Take 1 tablet (500 mg total) by mouth 2 (two) times daily. 60 tablet 5   No current facility-administered medications on file prior to visit.     ROS as in subjective   Objective BP 134/84   Pulse 71   Temp 97.9 F (36.6 C)   Wt 219 lb 6.4 oz (99.5 kg)   SpO2 97%   BMI 32.40 kg/m   General appearance: alert, no distress, WD/WN,  HEENT: normocephalic, sclerae anicteric, TMs pearly, nares patent, no discharge or erythema, pharynx with mild erythema Oral cavity: MMM, no lesions Neck: supple, no lymphadenopathy, no thyromegaly, no masses Heart: RRR, normal S1, S2, no murmurs Lungs: CTA bilaterally, no wheezes, rhonchi, or rales Ext: no edema Pulses: 2+ symmetric, upper and lower extremities, normal cap  refill    Assessment: Encounter Diagnosis  Name Primary?  Marland Kitchen Respiratory tract infection Yes     Plan: Discussed symptoms, exam findings, suggestive of URI.  Recommendations:  Rest  Hydrate with water throughout the day  You can use Mucinex DM or Coricidin HBP over the counter for cough and congestion  You can use nasal saline flush for nasal congestion and stuff nose  If not improving or worse within the next 3-5 days with productive cough, fever, wheezing, shortness of breath, then begin Zpak  otherwise your symptoms suggest cold symptoms  [name] was seen today for cold congestion.  Diagnoses and all orders for this visit:  Respiratory tract infection  Other orders -     azithromycin (ZITHROMAX) 250 MG tablet; 2 tablets day 1, then 1 tablet days 2-4

## 2017-09-07 NOTE — Patient Instructions (Addendum)
Recommendations:  Rest  Hydrate with water throughout the day  You can use Mucinex DM or Coricidin HBP over the counter for cough and congestion  You can use nasal saline flush for nasal congestion and stuff nose  If not improving or worse within the next 3-5 days with productive cough, fever, wheezing, shortness of breath, then begin Zpak  otherwise your symptoms suggest cold symptoms    Using Saline Nose Drops with Bulb Syringe A bulb syringe is used to clear your nose. You may use it when you have a stuffy nose, nasal congestion, sinus pressure, or sneezing.   SALINE SOLUTION You can buy nose drops at your local drug store. You can also make nose drops yourself. Mix 1 cup of water with  teaspoon of salt. Stir. Store this mixture at room temperature. Make a new batch daily.  USE THE BULB IN COMBINATION WITH SALINE NOSE DROPS  Squeeze the air out of the bulb before suctioning the saline mixture.  While still squeezing the bulb flat, place the tip of the bulb into the saline mixture.  Let air come back into the bulb.  This will suction up the saline mixture.  Gently flush one nostril at a time.  Salt water nose drops will then moisten your  congested nose and loosen secretions before suctioning.  Use the bulb syringe as directed below to suction.  USING THE BULB SYRINGE TO SUCTION  While still squeezing the bulb flat, place the tip of the bulb into a nostril. Let air come back into the bulb. The suction will pull snot out of the nose and into the bulb.  Repeat on the other nostril.  Squeeze syringe several times into a tissue.  CLEANING THE BULB SYRINGE  Clean the bulb syringe every day with hot soapy water.  Clean the inside of the bulb by squeezing the bulb while the tip is in soapy water.  Rinse by squeezing the bulb while the tip is in clean hot water.  Store the bulb with the tip side down on paper towel.  HOME CARE INSTRUCTIONS   Use saline nose drops  often to keep the nose open and not stuffy.  Throw away used salt water. Make a new solution every time.  Do not use the same solution and dropper for another person  If you do not prefer to use nasal saline flush, other options include nasal saline spray or the [employer], both of which are available over the counter at your pharmacy.

## 2017-09-12 ENCOUNTER — Ambulatory Visit: Payer: Medicare Other | Admitting: Cardiology

## 2017-09-13 ENCOUNTER — Other Ambulatory Visit: Payer: Self-pay | Admitting: Medical

## 2017-09-13 ENCOUNTER — Telehealth: Payer: Self-pay | Admitting: Family Medicine

## 2017-09-13 MED ORDER — PROMETHAZINE-DM 6.25-15 MG/5ML PO SYRP: 5.0000 mL | ORAL_SOLUTION | Freq: Four times a day (QID) | ORAL | 0 refills | Status: DC | PRN

## 2017-09-13 NOTE — Telephone Encounter (Signed)
pts wife called and states that ercel is still having a horrible cough, he is coughing up horrible yellow white thick mucous she is wanting to know if you will send him in some cough syrup,pt is still taking the z pak,  pt uses [facility] [place] [facility] [place] pt can, be reached at [phone]

## 2017-09-13 NOTE — Telephone Encounter (Signed)
Cough syrup sent .

## 2017-09-13 NOTE — Telephone Encounter (Signed)
Informed pt .

## 2017-09-19 ENCOUNTER — Other Ambulatory Visit: Payer: Self-pay | Admitting: Cardiology

## 2017-09-20 ENCOUNTER — Telehealth: Payer: Self-pay | Admitting: Pharmacist

## 2017-09-20 NOTE — Telephone Encounter (Signed)
[employer] safetyNet pending due to missing information. Applycation faxed today (2nd time) with all sections completed.

## 2017-09-30 ENCOUNTER — Encounter: Payer: Self-pay | Admitting: Gastroenterology

## 2017-09-30 ENCOUNTER — Other Ambulatory Visit (HOSPITAL_COMMUNITY): Payer: Medicare Other

## 2017-09-30 ENCOUNTER — Ambulatory Visit: Payer: Medicare Other | Admitting: Family

## 2017-10-03 ENCOUNTER — Telehealth: Payer: Self-pay

## 2017-10-03 NOTE — Telephone Encounter (Signed)
Med. Records sent to [facility]. 10-03-17

## 2017-10-13 ENCOUNTER — Ambulatory Visit (INDEPENDENT_AMBULATORY_CARE_PROVIDER_SITE_OTHER): Payer: Medicare Other | Admitting: Cardiology

## 2017-10-13 ENCOUNTER — Encounter: Payer: Self-pay | Admitting: Cardiology

## 2017-10-13 VITALS — BP 136/74 | HR 72 | Ht 69.0 in | Wt 220.0 lb

## 2017-10-13 DIAGNOSIS — T82855D Stenosis of coronary artery stent, subsequent encounter: Secondary | ICD-10-CM | POA: Diagnosis not present

## 2017-10-13 DIAGNOSIS — Z951 Presence of aortocoronary bypass graft: Secondary | ICD-10-CM | POA: Diagnosis not present

## 2017-10-13 DIAGNOSIS — Z789 Other specified health status: Secondary | ICD-10-CM

## 2017-10-13 DIAGNOSIS — E785 Hyperlipidemia, unspecified: Secondary | ICD-10-CM

## 2017-10-13 DIAGNOSIS — I110 Hypertensive heart disease with heart failure: Secondary | ICD-10-CM

## 2017-10-13 DIAGNOSIS — I2119 ST elevation (STEMI) myocardial infarction involving other coronary artery of inferior wall: Secondary | ICD-10-CM

## 2017-10-13 DIAGNOSIS — I25119 Atherosclerotic heart disease of native coronary artery with unspecified angina pectoris: Secondary | ICD-10-CM | POA: Diagnosis not present

## 2017-10-13 DIAGNOSIS — I5032 Chronic diastolic (congestive) heart failure: Secondary | ICD-10-CM | POA: Diagnosis not present

## 2017-10-13 DIAGNOSIS — I350 Nonrheumatic aortic (valve) stenosis: Secondary | ICD-10-CM

## 2017-10-13 MED ORDER — ROSUVASTATIN CALCIUM 20 MG PO TABS: 20.0000 mg | ORAL_TABLET | Freq: Every day | ORAL | 3 refills | Status: DC

## 2017-10-13 MED ORDER — COQ10 100 MG PO CAPS: 100.0000 mg | ORAL_CAPSULE | Freq: Three times a day (TID) | ORAL | 11 refills | Status: DC

## 2017-10-13 NOTE — Progress Notes (Signed)
PCP: Denita Lung, MD  Clinic Note: Chief Complaint  Patient presents with  . Follow-up    No chest pain or significant dyspnea  . Coronary Artery Disease    No active CHF    HPI: [name] is a 82 y.o. male with a PMH below who presents today for Hospital follow-up. I haven't seen him since January of this year when we did preoperative evaluation for knee surgery He has a long-standing history of CAD dating back to 41. This was for an inferior MI. He subsequently underwent 2 vessel CABG. He has known occlusion of the SVG-OM, and has had PCI in 2004 and 2007 as well as 2015. He also has history of AAA status post repair.  Las PCI August 2015 -- PCI to the RCA & OM (in-stent restenosis, and OM). Follow-up Showed patent stents and grafts.  July 18-20, 2018: Admitted with non-ST elevation MI. Cardiac catheterization showed occluded proximal portion of the LAD but no PCI target. He was put back on Plavix. Started on Ranexa; patent LIMA-LAD and RCA as well as circumflex stents - He is intolerant of statins currently on fenofibrate (having stopped Zetia).  Back in June, he followed up with Dr. Donzetta Matters from [facility] for AAA - has had issus with Endoleak.  Had Lumbar artery embolization by IR & IMA "stabilization" by Dr. Donzetta Matters.   Recent Hospitalizations: .none  I recently saw him in August after his hospitalization for non-STEMI with cardiac catheterization.  Studies Personally Reviewed - (if available, images/films reviewed: From Epic Chart or Care Everywhere)  Interval History: [name] presents today overall feeling fairly well from a cardiac standpoint. No chest pain with rest or exertion. He simply gets short of breath walking up and down stairs mother is worse when cold. Doesn't notice it is bad during the summer time. He notes some mild and today swelling, but only if he is been on his feet all day long standing active. This usually goes down at night when he puts his feet  up. Denies any exertional chest pain. He is happy to see his blood pressure better today.  Remainder of cardiac review of symptoms: No PND, orthopnea or edema.  No palpitations, lightheadedness, dizziness, weakness or syncope/near syncope. No TIA/amaurosis fugax symptoms. No claudication.  No claudication.  ROS: A comprehensive was performed. Review of Systems  Constitutional: Negative for malaise/fatigue.  HENT: Negative for congestion.   Respiratory: Positive for shortness of breath (Stable exertional dyspnea).   Cardiovascular: Negative for claudication.       Per history of present illness  Gastrointestinal: Negative for abdominal pain and heartburn.  Genitourinary: Positive for frequency (Baseline nocturia).  Musculoskeletal: Positive for joint pain (Normal for his pains).  Neurological: Negative for dizziness.  Psychiatric/Behavioral: Negative for depression. The patient is not nervous/anxious and does not have insomnia.   All other systems reviewed and are negative.  I have reviewed and (if needed) personally updated the patient's problem list, medications, allergies, past medical and surgical history, social and family history.   Past Medical History:  Diagnosis Date  . Adenomatous colon polyp   . Ankle edema    Chronic  . Asthma    as a child  . CAD S/P percutaneous coronary angioplasty 06/2003, 12/2005, 05/2014   a) '04: Staged Taxus DES PCI to RCA (2 Taxus 2.75 x 32 & 12) and Cx-OM (Taxus 3 x 20);;'07 - PCI to pRCA ISR- Cypher DES; c) 05/2014: pPCI pRCA stentISR (Promus DES 3  x 8) OM1 distal to stent (Promus DES 2.75 x 12 - 3.1)  . CAD, multiple vessel 1985   Most recent July 2018: Admitted for non-STEMI. Occluded LAD with patent LIMA (SP1 now occluded).  Patent stents in proximal and mid RCA as well as circumflex-OM 3. Known occlusion of SVG-OM. EF 45-50%  . Cholelithiasis - with cholangitis & choledocholithiasis    status post ERCP with removal of calculi and biliary  stent placement.  . Chronic anemia    On iron supplement; history of positive guaiac - negative colonoscopy in 1996.; Thought to be related to hemorrhoids; status post hemorrhoidectomy  . Chronic back pain     multiple surgeries; C-spine and lumbar  . Diabetes mellitus    on oral medication  . Diverticulitis of colon 1996  . Diverticulosis   . Dyslipidemia, goal LDL below 70   . Erectile dysfunction   . Exertional dyspnea    Chronic baseline SOB with ambulation  . GERD (gastroesophageal reflux disease)   . H/O: pneumonia February 14  . Hemorrhoids   . Hiatal hernia   . History of: ST elevation myocardial infarction (STEMI) involving left circumflex coronary artery with complication 1017   PTCA-circumflex; PCI in 1991  . Hypertension   . Moderate aortic stenosis by prior echocardiogram 07/2015   Normal LV function - EF 55-60%.. Abnormal relaxation. Mild-moderate aortic stenosis (peak/mean gradient 18/10 mmH)  . Osteoarthritis of both knees    And back; multiple back surgeries, right knee arthroplasty and left knee arthroscopic surgery x2  . Pneumonia 2016  . S/P AAA (abdominal aortic aneurysm) repair 08/30/2012   s/p EVAR  . S/P CABG x 2 1997   LIMA-LAD, SVG-OM  . Sleep apnea    pt. states he was told to return for f/u, to be fitted for Cpap, but pt. reports that he didn't follow up-no cpap used    Past Surgical History:  Procedure Laterality Date  . Abdominal and Lower Extremity Arterial Ultrasound  08/23/2012; 10/10/2013   Normal ABIs. Nonocclusive lower extremity disease. 4.2 cm x 4.3 cm infrarenal AAA;; 4.4 cm x 4.3 cm (essentially stable)   . ABDOMINAL AORTIC ENDOVASCULAR STENT GRAFT N/A 08/13/2015   Procedure: ABDOMINAL AORTIC ENDOVASCULAR STENT GRAFT;  Surgeon: Mal Misty, MD;  Location: [facility];  Service: Vascular;  Laterality: N/A;  . Anterior cervical plating  04/23/10   At C4-5 and a C6-7 utilizing two separate Biomet MaxAn plates.  . ANTERIOR LAT LUMBAR FUSION Left  11/22/2012   Procedure: ANTERIOR LATERAL LUMBAR FUSION 1 LEVEL;  Surgeon: Eustace Moore, MD;  Location: [facility];  Service: Neurosurgery;  Laterality: Left;  Anterior Lateral Lumbar Fusion Lumbar Three-Four  . BACK SURGERY  1979 & x 10   pt. remarks, "I have had about 10 back surgeries"  . BILIARY STENT PLACEMENT N/A 07/03/2014   Procedure: BILIARY STENT PLACEMENT;  Surgeon: Inda Castle, MD;  Location: [facility];  Service: Endoscopy;  Laterality: N/A;  . CARDIAC CATHETERIZATION  September 2004   None Occluded vein graft to OM; diffuse RCA disease in the mid vessel, 80% circumflex-OM stenosis; follow on AV groove circumflex with sequential 90% stenoses and intervening saccular dilation   . CARDIAC CATHETERIZATION  March 2010   4 abnormal Myoview showing apical thinning (possibly due to apical LAD 95%) : 100% Occluded LAD after SV1, distal LAD grafted via LIMA -apical 95% . Cx -OM1 w/patent stent extending into OM 1 . Follow on Cx - 70-80% - non-amenable  PCI. RCA widely patent 3 overlapping stents in mRCA w/less than 40% stenosisin RPL; SVG-OM known occluded   . CATARACT EXTRACTION    . Cervical arthrodesis  04/23/10   Anterior cervical arthrodesis, C4-5, C6-7 utilizing 7-mm PEEK interbody cage packed with local autograft & Antifuse putty at C4-5 & an 8-mm cage at C6-7.  Marland Kitchen CERVICAL DISCECTOMY  04/23/10   Decompressive anterior carvical diskectomy. C4-5, C6-7  . CHOLECYSTECTOMY N/A 05/23/2015   Procedure: LAPAROSCOPIC CHOLECYSTECTOMY WITH INTRAOPERATIVE CHOLANGIOGRAM;  Surgeon: Alphonsa Overall, MD;  Location: [facility];  Service: General;  Laterality: N/A;  . [place]  . CORONARY ANGIOPLASTY  1985,1991,15   1985 lateral STEMI Circumflex PTCA;   . CORONARY ARTERY BYPASS GRAFT  1997   LIMA-LAD, SVG-OM (SVG known to be occluded prior to 2004)  . ERCP N/A 07/03/2014   Procedure: ENDOSCOPIC RETROGRADE CHOLANGIOPANCREATOGRAPHY (ERCP);  Surgeon: Inda Castle, MD;  Location: [facility];   Service: Endoscopy;  Laterality: N/A;  . ERCP N/A 07/05/2014   Procedure: ENDOSCOPIC RETROGRADE CHOLANGIOPANCREATOGRAPHY (ERCP);  Surgeon: Inda Castle, MD;  Location: [facility];  Service: Endoscopy;  Laterality: N/A;  . ERCP N/A 06/30/2015   Procedure: ENDOSCOPIC RETROGRADE CHOLANGIOPANCREATOGRAPHY (ERCP);  Surgeon: Ladene Artist, MD;  Location: [facility];  Service: Endoscopy;  Laterality: N/A;  . EYE SURGERY    . IR ANGIOGRAM EXTREMITY LEFT  01/18/2017  . IR ANGIOGRAM PELVIS SELECTIVE OR SUPRASELECTIVE  01/18/2017  . IR ANGIOGRAM SELECTIVE EACH ADDITIONAL VESSEL  01/18/2017  . IR AORTAGRAM ABDOMINAL SERIALOGRAM  01/18/2017  . IR EMBO ARTERIAL NOT HEMORR HEMANG INC GUIDE ROADMAPPING  01/18/2017  . IR RADIOLOGIST EVAL & MGMT  01/04/2017  . IR US GUIDE VASC ACCESS RIGHT  01/18/2017  . KNEE ARTHROSCOPY Left    x 2  . LEFT HEART CATH AND CORS/GRAFTS ANGIOGRAPHY N/A 04/20/2017   Procedure: Left Heart Cath and Cors/Grafts Angiography;  Surgeon: Lorretta Harp, MD;  Location: [facility] INVASIVE CV LAB;  LAD now occluded prior to SP1.LIMA-LAD. Patent RCA and circumflex stents. EF 45-50%. Relatively stable.   Marland Kitchen LEFT HEART CATHETERIZATION WITH CORONARY ANGIOGRAM N/A 05/15/2014   Procedure: LEFT HEART CATHETERIZATION WITH CORONARY ANGIOGRAM;  Surgeon: Sinclair Grooms, MD;  Location: [facility] CATH LAB;  Service: Cardiovascular;  Laterality: N/A;  . LEFT HEART CATHETERIZATION WITH CORONARY/GRAFT ANGIOGRAM N/A 06/28/2014   Procedure: LEFT HEART CATHETERIZATION WITH Beatrix Fetters;  Surgeon: Troy Sine, MD;  Location: [facility] CATH LAB;  Service: Cardiovascular;  Laterality: N/A;  . LUMBAR PERCUTANEOUS PEDICLE SCREW 1 LEVEL N/A 11/22/2012   Procedure: LUMBAR PERCUTANEOUS PEDICLE SCREW 1 LEVEL;  Surgeon: Eustace Moore, MD;  Location: [facility];  Service: Neurosurgery;  Laterality: N/A;  Lumbar Three-Four Percutaneous Pedicle Screw, Lateral approach  . MINOR HEMORRHOIDECTOMY    . NM MYOVIEW LTD  December  2013   LOW RISK. Mmoderate region of mid to basal inferolateral scar without ischemia. Mild apical hypokinesis with an EF of 47%.  Marland Kitchen PERCUTANEOUS CORONARY STENT INTERVENTION (PCI-S)  September 2004   PCI - RCA 2 overlapping Taxus DES 2.75 mm x 32 mm and 2.75 mm x 12 mm (3.0 mm); PCI-Cx-OM1 - Taxus DES 3.0 mm x 20 mm (3.1 mm);   Marland Kitchen PERCUTANEOUS CORONARY STENT INTERVENTION (PCI-S)  12/2005   80% ISR in proximal Taxus stent in RCA -- covered proximally with Cypher DES 3.0 mm x 12 mm  . PERCUTANEOUS CORONARY STENT INTERVENTION (PCI-S) N/A 05/17/2014   Procedure: PERCUTANEOUS CORONARY STENT INTERVENTION (PCI-S);  Surgeon: Sinclair Grooms, MD;  Location: [facility] CATH LAB: PCI pRCA stent ISR - Promus DES 3.0 x 8 (3.25), OM distal stent edge - Promus DES 2.75 x 12 (3.1)  . PERIPHERAL VASCULAR CATHETERIZATION  11/03/2016   Procedure: Embolization;  Surgeon: Waynetta Sandy, MD;  Location: [facility] CV LAB;  Service: Cardiovascular;;  . POSTERIOR CERVICAL FUSION/FORAMINOTOMY N/A 05/02/2013   Procedure: POSTERIOR CERVICAL FUSION/FORAMINOTOMY CERVICAL SEVEN THORACIC-ONE;  Surgeon: Eustace Moore, MD;  Location: [facility];  Service: Neurosurgery;  Laterality: N/A;  POSTERIOR CERVICAL FUSION/FORAMINOTOMY CERVICAL SEVEN THORACIC-ONE  . SPHINCTEROTOMY N/A 06/30/2015   Procedure: SPHINCTEROTOMY;  Surgeon: Ladene Artist, MD;  Location: [facility];  Service: Endoscopy;  Laterality: N/A;  . TOTAL KNEE ARTHROPLASTY Right   . TOTAL KNEE ARTHROPLASTY Left 12/20/2016   Procedure: LEFT TOTAL KNEE ARTHROPLASTY;  Surgeon: Vickey Huger, MD;  Location: [facility];  Service: Orthopedics;  Laterality: Left;  . TRANSTHORACIC ECHOCARDIOGRAM  December 2013; October 2016   a. EF 55-60%. mod LA dilation. Aortic Sclerosis;; b. EF 55-60%, Gr 1 DD, Mild-Mod AS (Peark - Mean Gradient 18-10 mmHg)  . TRANSTHORACIC ECHOCARDIOGRAM  07/2016   EF 45-50%. Inferolateral hypokinesis (correlates with scar noted on Myoview). GR 1 DD. Mild  aortic stenosis. Mod LAE.  Marland Kitchen VISCERAL ANGIOGRAM  11/03/2016   Procedure: Visceral Angiogram;  Surgeon: Waynetta Sandy, MD;  Location: [facility] CV LAB;  Service: Cardiovascular;;    CARDIAC CATH 04/20/2017: New finding is LAD now occluded prior to SP1 & Diag1.  Not good PCI option.  Prox RCA to Mid RCA stents & Ost 3rd Mrg to 3rd Mrg stents, 0 %stenosed.  LIMA-LAD and is normal in caliber and anatomically normal.  Ost LAD to Prox LAD lesion, 100 %stenosed - prior to SP1/Diag1 (~ new)   There is mild left ventricular systolic dysfunction.  EF is 45-50% by visual estimate. Normal LVEDP   Current Meds  Medication Sig  . acetaminophen (TYLENOL) 500 MG tablet Take 1,000 mg by mouth every 6 (six) hours as needed for mild pain, moderate pain or headache. Reported on 03/16/2016  . albuterol (PROVENTIL HFA;VENTOLIN HFA) 108 (90 Base) MCG/ACT inhaler Inhale 2 puffs into the lungs every 6 (six) hours as needed for wheezing or shortness of breath.  Marland Kitchen amLODipine (NORVASC) 5 MG tablet TAKE 5 MG BY MOUTH DAILY....need appointment before further refills (Patient taking differently: Take 5 mg by mouth daily. )  . aspirin EC 81 MG EC tablet Take 1 tablet (81 mg total) by mouth daily.  . Choline Fenofibrate (FENOFIBRIC ACID) 135 MG CPDR TAKE 1 CAPSULE BY MOUTH ONCE DAILY  . clopidogrel (PLAVIX) 75 MG tablet TAKE ONE TABLET BY MOUTH ONCE DAILY (Patient taking differently: TAKE 75mg  BY MOUTH ONCE DAILY)  . ferrous sulfate 325 (65 FE) MG tablet Take 325 mg by mouth daily with breakfast.  . glucose blood (ONETOUCH VERIO) test strip USE ONE STRIP TO CHECK GLUCOSE TWICE DAILY E11.9  . losartan (COZAAR) 100 MG tablet TAKE 1 TABLET BY MOUTH ONCE DAILY  . metFORMIN (GLUCOPHAGE) 500 MG tablet Take 1 tablet (500 mg total) by mouth daily with breakfast.  . Multiple Vitamins-Minerals (MULTIVITAMIN WITH MINERALS) tablet Take 1 tablet by mouth daily.   . nitroGLYCERIN (NITROSTAT) 0.4 MG SL tablet Place 1 tablet  (0.4 mg total) under the tongue every 5 (five) minutes x 3 doses as needed for chest pain.  . pantoprazole (PROTONIX) 40 MG tablet TAKE ONE TABLET BY MOUTH ONCE DAILY (  Patient taking differently: TAKE 40mg  BY MOUTH ONCE DAILY)  . promethazine-dextromethorphan (PROMETHAZINE-DM) 6.25-15 MG/5ML syrup Take 5 mLs by mouth 4 (four) times daily as needed for cough.  . ranolazine (RANEXA) 500 MG 12 hr tablet Take 1 tablet (500 mg total) by mouth 2 (two) times daily.  . sildenafil (REVATIO) 20 MG tablet TAKE 2-5 TABLETS BY MOUTH DAILY AS NEEDED (Patient taking differently: TAKE 40-100 BY MOUTH DAILY AS NEEDED FOR ERECTILE DYSFUNCTION)    Allergies  Allergen Reactions  . Oxycodone Shortness Of Breath and Cough  . Lisinopril Cough  . Statins Other (See Comments)    MYALGIAS   . Welchol [Colesevelam Hcl] Itching    Social History   Socioeconomic History  . Marital status: Married    Spouse name: None  . Number of children: None  . Years of education: None  . Highest education level: None  Social Needs  . Financial resource strain: None  . Food insecurity - worry: None  . Food insecurity - inability: None  . Transportation needs - medical: None  . Transportation needs - non-medical: None  Occupational History  . None  Tobacco Use  . Smoking status: Former Smoker    Types: Cigarettes    Last attempt to quit: 10/05/1983    Years since quitting: 34.0  . Smokeless tobacco: Never Used  Substance and Sexual Activity  . Alcohol use: Yes    Alcohol/week: 0.6 oz    Types: 1 Glasses of wine per week    Comment:  4 oz. wine daily  . Drug use: No  . Sexual activity: Not Currently  Other Topics Concern  . None  Social History Narrative   He is married, father of two, grandfather to 8, great grandfather to two.    Not really getting much exercise now, do to his significant back and hip pain.    He does not smoke and only has an alcoholic beverage.     family history includes Arthritis in  his mother; Breast cancer in his sister; Diabetes in his father and sister; Heart disease in his brother, father, and sister; Hypertension in his sister; Stroke in his sister; Ulcers in his brother.  Wt Readings from Last 3 Encounters:  10/13/17 220 lb (99.8 kg)  09/07/17 219 lb 6.4 oz (99.5 kg)  07/20/17 216 lb 6.4 oz (98.2 kg)    PHYSICAL EXAM BP 136/74   Pulse 72   Ht 5\' 9"  (1.753 m)   Wt 220 lb (99.8 kg)   BMI 32.49 kg/m  Physical Exam  Constitutional: He is oriented to person, place, and time. He appears well-developed and well-nourished. No distress.  Mildly obese  HENT:  Head: Normocephalic and atraumatic.  Neck: Neck supple. No JVD present. No thyromegaly present.  Cardiovascular: Normal rate, regular rhythm and intact distal pulses. Exam reveals no gallop and no friction rub.  Murmur (2/6 SEM at RUSB radiates to carotids) heard. No carotid bruit or JVD  Pulmonary/Chest: Effort normal and breath sounds normal. No respiratory distress. He has no wheezes. He has no rales.  Distant breath sounds  Abdominal: Soft. Bowel sounds are normal. He exhibits no distension. There is no tenderness. There is no rebound.  Musculoskeletal: Normal range of motion. He exhibits no edema.  Neurological: He is alert and oriented to person, place, and time.  Skin: Skin is warm and dry. No rash noted. No erythema.  Psychiatric: He has a normal mood and affect. His behavior is normal. Judgment and thought content  normal.  Nursing note and vitals reviewed.   Adult ECG Report  n/a  Other studies Reviewed: Additional studies/ records that were reviewed today include:  Recent Labs:   Lab Results  Component Value Date   CHOL 158 06/20/2017   HDL 44 06/20/2017   LDLCALC 96 06/20/2017   TRIG 92 06/20/2017   CHOLHDL 3.6 06/20/2017   Lab Results  Component Value Date   CREATININE 1.38 (H) 04/21/2017   BUN 18 04/21/2017   NA 137 04/21/2017   K 3.9 04/21/2017   CL 110 04/21/2017   CO2 21  (L) 04/21/2017    ASSESSMENT / PLAN:  Problem List Items Addressed This Visit    CAD S/P CABG '95- several PCis since, last 05/17/14 - Primary (Chronic)   Relevant Medications   rosuvastatin (CRESTOR) 20 MG tablet   Other Relevant Orders   ECHOCARDIOGRAM COMPLETE   Chronic diastolic CHF (congestive heart failure) (HCC) (Chronic)   Relevant Medications   rosuvastatin (CRESTOR) 20 MG tablet   Other Relevant Orders   ECHOCARDIOGRAM COMPLETE   Coronary stent restenosis with uncertain cause: Required PCI for ISR of OM1 (Promus P 2.75 mm x 12 mm) & pRCA (Chronic)   Relevant Orders   ECHOCARDIOGRAM COMPLETE   History of ST elevation myocardial infarction (STEMI) of inferolateral wall (PTCA - 100% large lateral OM) (Chronic)   Relevant Medications   rosuvastatin (CRESTOR) 20 MG tablet   Hyperlipidemia LDL goal <70; statin intolerant (Chronic)   Relevant Medications   rosuvastatin (CRESTOR) 20 MG tablet   Hypertensive heart disease with chronic diastolic congestive heart failure (HCC) (Chronic)   Relevant Medications   rosuvastatin (CRESTOR) 20 MG tablet   Other Relevant Orders   ECHOCARDIOGRAM COMPLETE   Moderate aortic stenosis by prior echocardiogram (Chronic)   Relevant Medications   rosuvastatin (CRESTOR) 20 MG tablet   Other Relevant Orders   ECHOCARDIOGRAM COMPLETE   S/P CABG x 2: 1997. SVG-OM (known to be occluded), LIMA-LAD (Chronic)   Statin intolerance     Overall, since doing very well from a cardiac standpoint. No further anginal symptoms. Normal heart failure symptoms. For his CAD he remains on aspirin and Plavix because of his multiple stents.  He is on Ranexa and amlodipine with well-controlled angina. He is on amlodipine and losartan for blood pressure, not on beta blocker due to COPD and history of bradycardia.  Essentially no heart failure symptoms. No diuretic requirement. He is on ARB for afterload reduction, not on beta blocker for bradycardia reasons.  He is  on fenofibrate and Zetia. --Will restart statin with rosuvastatin that he will titrate up gradually. -- Goal LDL would be less than 70. If not able to achieve this goal, may need to consider PCSK9. Was seen by Raquel Rodriguez-Guzman,RPH in [facility], had been undergoing the paperwork for evaluation to use either pregnant or Repatha. -- They're concerned about the financial issues with him also being on Ranexa.  He is due for follow-up echocardiogram in roughly October timeframe to reassess his aortic valve and EF.  Current medicines are reviewed at length with the patient today. (+/- concerns) None The following changes have been made: See below  Patient Instructions  MEDICATION INSTRUCTION   CoQ10 300 MG - START TAKING 100 MG EVERYDAY FOR ONE WEEK , AND ADD AN ADDITIONAL 100 MG EVERY WEEK UNTIL YOU ARE AT 300 MG DAILY   -- START ROSUVASTATIN   FOR THE FIRST MONTH TAKE  1/2 TABLET  3 DAYS A WEEK  THEN  1/2 TABLET  EVERY DAY  FOR 2 WEEKS  THEN  20 MG 3 DAYS THEN 10 MG THE OTHER DAYS FOR 2 WEEKS IF ABLE TOLERATE MEDICATION  INCREASE TO 20 MG  DAILY.   LABS IN 4 TO 5 MONTHS WILL MAIL LABSLIP TO YOU  LIPID  CMP   SCHEDULE [place] [address] IN  OCT 2019.Your physician has requested that you have an echocardiogram. Echocardiography is a painless test that uses sound waves to create images of your heart. It provides your doctor with information about the size and shape of your heart and how well your heart's chambers and valves are working. This procedure takes approximately one hour. There are no restrictions for this procedure.    Your physician wants you to follow-up in OCT 2019 [place]. You will receive a reminder letter in the mail two months in advance. If you don't receive a letter, please call our office to schedule the follow-up appointment.    Studies Ordered:   Orders Placed This Encounter  Procedures  . ECHOCARDIOGRAM COMPLETE      Glenetta Hew, M.D., M.S. Interventional Cardiologist   Pager # [phone] Phone # [phone] [address] [place], [place] [address]

## 2017-10-13 NOTE — Patient Instructions (Signed)
MEDICATION INSTRUCTION   CoQ10 300 MG - START TAKING 100 MG EVERYDAY FOR ONE WEEK , AND ADD AN ADDITIONAL 100 MG EVERY WEEK UNTIL YOU ARE AT 300 MG DAILY   -- START ROSUVASTATIN   FOR THE FIRST MONTH TAKE  1/2 TABLET  3 DAYS A WEEK   THEN  1/2 TABLET  EVERY DAY  FOR 2 WEEKS  THEN  20 MG 3 DAYS THEN 10 MG THE OTHER DAYS FOR 2 WEEKS IF ABLE TOLERATE MEDICATION  INCREASE TO 20 MG  DAILY.   LABS IN 4 TO 5 MONTHS WILL MAIL LABSLIP TO YOU  LIPID  CMP   SCHEDULE [place] [address] IN  OCT 2019.Your physician has requested that you have an echocardiogram. Echocardiography is a painless test that uses sound waves to create images of your heart. It provides your doctor with information about the size and shape of your heart and how well your heart's chambers and valves are working. This procedure takes approximately one hour. There are no restrictions for this procedure.    Your physician wants you to follow-up in OCT 2019 [place]. You will receive a reminder letter in the mail two months in advance. If you don't receive a letter, please call our office to schedule the follow-up appointment.

## 2017-10-16 ENCOUNTER — Encounter: Payer: Self-pay | Admitting: Cardiology

## 2017-10-21 ENCOUNTER — Ambulatory Visit (HOSPITAL_COMMUNITY)
Admission: RE | Admit: 2017-10-21 | Discharge: 2017-10-21 | Disposition: A | Discharge status: Home / Self Care | Payer: Medicare Other | Source: Ambulatory Visit | Attending: Family | Admitting: Family

## 2017-10-21 ENCOUNTER — Other Ambulatory Visit: Payer: Self-pay

## 2017-10-21 ENCOUNTER — Ambulatory Visit (INDEPENDENT_AMBULATORY_CARE_PROVIDER_SITE_OTHER): Payer: Medicare Other | Admitting: Family

## 2017-10-21 ENCOUNTER — Encounter: Payer: Self-pay | Admitting: Family

## 2017-10-21 VITALS — BP 160/84 | HR 70 | Temp 96.8°F | Resp 18 | Ht 69.0 in | Wt 220.0 lb

## 2017-10-21 DIAGNOSIS — R609 Edema, unspecified: Secondary | ICD-10-CM | POA: Diagnosis not present

## 2017-10-21 DIAGNOSIS — T82330D Leakage of aortic (bifurcation) graft (replacement), subsequent encounter: Secondary | ICD-10-CM | POA: Diagnosis not present

## 2017-10-21 DIAGNOSIS — Z48812 Encounter for surgical aftercare following surgery on the circulatory system: Secondary | ICD-10-CM | POA: Diagnosis not present

## 2017-10-21 DIAGNOSIS — I714 Abdominal aortic aneurysm, without rupture, unspecified: Secondary | ICD-10-CM

## 2017-10-21 DIAGNOSIS — I25119 Atherosclerotic heart disease of native coronary artery with unspecified angina pectoris: Secondary | ICD-10-CM

## 2017-10-21 DIAGNOSIS — Z87891 Personal history of nicotine dependence: Secondary | ICD-10-CM

## 2017-10-21 DIAGNOSIS — IMO0001 Reserved for inherently not codable concepts without codable children: Secondary | ICD-10-CM

## 2017-10-21 NOTE — Progress Notes (Signed)
[facility] [place]  CC: Follow up s/p Endovascular Repair of Abdominal Aortic Aneurysm    History of Present Illness  [name] is a 82 y.o. (Jan 17, 1936) male who is s/p EVAR on 08-13-15 by Dr. Kellie Simmering, s/p embolization of a lumbar artery on 01-18-17 performed by Dr. Barbie Banner in interventional radiology. In 2018 he has also undergone stabilization of an IMA on 11-03-16 by Dr. Donzetta Matters, and also had a heart cath in July 2018, and left knee TKA in March 2018. He overall is recovered well from all these procedures.  Dr. Donzetta Matters last evaluated pt on 03-25-17. At that time pt was s/p recent coil embolization of a type II endoleak by both Dr. Donzetta Matters,  followed by Dr. Barbie Banner with interventional radiology. He is also had a left knee surgery and a heart cath which he has recovered well from. He hds no new complaints related to that visit, no new back or abdominal pain, and was overall doing well. His aneurysm was stable at 5.6 cm back to June 2017. There was a small suggestion of persistent leak in which Dr. Donzetta Matters could not identify the source on CTA. Given the stable size of his aneurysm at that time, pt was to return in 6 months with repeat duplex. Dr. Donzetta Matters discussed the signs and symptoms of rupture which he knows well.   Pt denies any known family history of AAA.   He reports chronic low back pain, has had several lumbar spine surgeries. He denies any new back pain, denies abdominal pain.   He reports claudication type sx's in both calves after walking 2-3 blocks, relieved by rest. He denies any hx of stroke or TIA. He reports 5-6 MI's, had a CABG.   He states the swelling in his lower legs resolves by morning.   Pt states his blood pressure at home is 130-865 systolic.   He states he has chronic shortness of breath and a chronic cough, states both are no worse than usual.   Diabetic: Yes, A1C was 4.9 on 07-20-17 Tobaccos use: former smoker, quit quit in 1985, started about age 55  years   Past Medical History:  Diagnosis Date  . Adenomatous colon polyp   . Ankle edema    Chronic  . Asthma    as a child  . CAD S/P percutaneous coronary angioplasty 06/2003, 12/2005, 05/2014   a) '04: Staged Taxus DES PCI to RCA (2 Taxus 2.75 x 32 & 12) and Cx-OM (Taxus 3 x 20);;'07 - PCI to pRCA ISR- Cypher DES; c) 05/2014: pPCI pRCA stentISR (Promus DES 3 x 8) OM1 distal to stent (Promus DES 2.75 x 12 - 3.1)  . CAD, multiple vessel 1985   Most recent July 2018: Admitted for non-STEMI. Occluded LAD with patent LIMA (SP1 now occluded).  Patent stents in proximal and mid RCA as well as circumflex-OM 3. Known occlusion of SVG-OM. EF 45-50%  . Cholelithiasis - with cholangitis & choledocholithiasis    status post ERCP with removal of calculi and biliary stent placement.  . Chronic anemia    On iron supplement; history of positive guaiac - negative colonoscopy in 1996.; Thought to be related to hemorrhoids; status post hemorrhoidectomy  . Chronic back pain     multiple surgeries; C-spine and lumbar  . Diabetes mellitus    on oral medication  . Diverticulitis of colon 1996  . Diverticulosis   . Dyslipidemia, goal LDL below 70   . Erectile dysfunction   .  Exertional dyspnea    Chronic baseline SOB with ambulation  . GERD (gastroesophageal reflux disease)   . H/O: pneumonia February 14  . Hemorrhoids   . Hiatal hernia   . History of: ST elevation myocardial infarction (STEMI) involving left circumflex coronary artery with complication 4696   PTCA-circumflex; PCI in 1991  . Hypertension   . Moderate aortic stenosis by prior echocardiogram 07/2015   Normal LV function - EF 55-60%.. Abnormal relaxation. Mild-moderate aortic stenosis (peak/mean gradient 18/10 mmH)  . Osteoarthritis of both knees    And back; multiple back surgeries, right knee arthroplasty and left knee arthroscopic surgery x2  . Pneumonia 2016  . S/P AAA (abdominal aortic aneurysm) repair 08/30/2012   s/p EVAR  . S/P  CABG x 2 1997   LIMA-LAD, SVG-OM  . Sleep apnea    pt. states he was told to return for f/u, to be fitted for Cpap, but pt. reports that he didn't follow up-no cpap used   Past Surgical History:  Procedure Laterality Date  . Abdominal and Lower Extremity Arterial Ultrasound  08/23/2012; 10/10/2013   Normal ABIs. Nonocclusive lower extremity disease. 4.2 cm x 4.3 cm infrarenal AAA;; 4.4 cm x 4.3 cm (essentially stable)   . ABDOMINAL AORTIC ENDOVASCULAR STENT GRAFT N/A 08/13/2015   Procedure: ABDOMINAL AORTIC ENDOVASCULAR STENT GRAFT;  Surgeon: Mal Misty, MD;  Location: [place];  Service: Vascular;  Laterality: N/A;  . Anterior cervical plating  04/23/10   At C4-5 and a C6-7 utilizing two separate Biomet MaxAn plates.  . ANTERIOR LAT LUMBAR FUSION Left 11/22/2012   Procedure: ANTERIOR LATERAL LUMBAR FUSION 1 LEVEL;  Surgeon: Eustace Moore, MD;  Location: [place] [facility];  Service: Neurosurgery;  Laterality: Left;  Anterior Lateral Lumbar Fusion Lumbar Three-Four  . BACK SURGERY  1979 & x 10   pt. remarks, "I have had about 10 back surgeries"  . BILIARY STENT PLACEMENT N/A 07/03/2014   Procedure: BILIARY STENT PLACEMENT;  Surgeon: Inda Castle, MD;  Location: [place];  Service: Endoscopy;  Laterality: N/A;  . CARDIAC CATHETERIZATION  September 2004   None Occluded vein graft to OM; diffuse RCA disease in the mid vessel, 80% circumflex-OM stenosis; follow on AV groove circumflex with sequential 90% stenoses and intervening saccular dilation   . CARDIAC CATHETERIZATION  March 2010   4 abnormal Myoview showing apical thinning (possibly due to apical LAD 95%) : 100% Occluded LAD after SV1, distal LAD grafted via LIMA -apical 95% . Cx -OM1 w/patent stent extending into OM 1 . Follow on Cx - 70-80% - non-amenable PCI. RCA widely patent 3 overlapping stents in mRCA w/less than 40% stenosisin RPL; SVG-OM known occluded   . CATARACT EXTRACTION    . Cervical arthrodesis  04/23/10   Anterior cervical  arthrodesis, C4-5, C6-7 utilizing 7-mm PEEK interbody cage packed with local autograft & Antifuse putty at C4-5 & an 8-mm cage at C6-7.  Marland Kitchen CERVICAL DISCECTOMY  04/23/10   Decompressive anterior carvical diskectomy. C4-5, C6-7  . CHOLECYSTECTOMY N/A 05/23/2015   Procedure: LAPAROSCOPIC CHOLECYSTECTOMY WITH INTRAOPERATIVE CHOLANGIOGRAM;  Surgeon: Alphonsa Overall, MD;  Location: [facility];  Service: General;  Laterality: N/A;  . [place]  . CORONARY ANGIOPLASTY  1985,1991,15   1985 lateral STEMI Circumflex PTCA;   . CORONARY ARTERY BYPASS GRAFT  1997   LIMA-LAD, SVG-OM (SVG known to be occluded prior to 2004)  . ERCP N/A 07/03/2014   Procedure: ENDOSCOPIC RETROGRADE CHOLANGIOPANCREATOGRAPHY (ERCP);  Surgeon: Inda Castle,  MD;  Location: [facility];  Service: Endoscopy;  Laterality: N/A;  . ERCP N/A 07/05/2014   Procedure: ENDOSCOPIC RETROGRADE CHOLANGIOPANCREATOGRAPHY (ERCP);  Surgeon: Inda Castle, MD;  Location: [facility];  Service: Endoscopy;  Laterality: N/A;  . ERCP N/A 06/30/2015   Procedure: ENDOSCOPIC RETROGRADE CHOLANGIOPANCREATOGRAPHY (ERCP);  Surgeon: Ladene Artist, MD;  Location: [facility];  Service: Endoscopy;  Laterality: N/A;  . EYE SURGERY    . IR ANGIOGRAM EXTREMITY LEFT  01/18/2017  . IR ANGIOGRAM PELVIS SELECTIVE OR SUPRASELECTIVE  01/18/2017  . IR ANGIOGRAM SELECTIVE EACH ADDITIONAL VESSEL  01/18/2017  . IR AORTAGRAM ABDOMINAL SERIALOGRAM  01/18/2017  . IR EMBO ARTERIAL NOT HEMORR HEMANG INC GUIDE ROADMAPPING  01/18/2017  . IR RADIOLOGIST EVAL & MGMT  01/04/2017  . IR US GUIDE VASC ACCESS RIGHT  01/18/2017  . KNEE ARTHROSCOPY Left    x 2  . LEFT HEART CATH AND CORS/GRAFTS ANGIOGRAPHY N/A 04/20/2017   Procedure: Left Heart Cath and Cors/Grafts Angiography;  Surgeon: Lorretta Harp, MD;  Location: [facility] IN[facility]IVE [facility];  LAD now occluded prior to SP1.LIMA-LAD. Patent RCA and circumflex stents. EF 45-50%. Relatively stable.   Marland Kitchen LEFT HEART CATHETERIZATION WITH CORONARY  ANGIOGRAM N/A 05/15/2014   Procedure: LEFT HEART CATHETERIZATION WITH CORONARY ANGIOGRAM;  Surgeon: Sinclair Grooms, MD;  Location: [facility] CATH LAB;  Service: Cardiovascular;  Laterality: N/A;  . LEFT HEART CATHETERIZATION WITH CORONARY/GRAFT ANGIOGRAM N/A 06/28/2014   Procedure: LEFT HEART CATHETERIZATION WITH Beatrix Fetters;  Surgeon: Troy Sine, MD;  Location[facility] C[facility];  Service: Cardiovascular;  Laterality: N/A;  . LUMBAR PERCUTANEOUS PEDICLE SCREW 1 LEVEL N/A 11/22/2012   Procedure: LUMBAR PERCUTANEOUS PEDICLE SCREW 1 LEVEL;  Surgeon: Eustace Moore, MD;  Location: [place] [facility];  Service: Neurosurgery;  Laterality: N/A;  Lumbar Three-Four Percutaneous Pedicle Screw, Lateral approach  . MINOR HEMORRHOIDECTOMY    . NM MYOVIEW LTD  December 2013   LOW RISK. Mmoderate region of mid to basal inferolateral scar without ischemia. Mild apical hypokinesis with an EF of 47%.  Marland Kitchen PERCUTANEOUS CORONARY STENT INTERVENTION (PCI-S)  September 2004   PCI - RCA 2 overlapping Taxus DES 2.75 mm x 32 mm and 2.75 mm x 12 mm (3.0 mm); PCI-Cx-OM1 - Taxus DES 3.0 mm x 20 mm (3.1 mm);   Marland Kitchen PERCUTANEOUS CORONARY STENT INTERVENTION (PCI-S)  12/2005   80% ISR in proximal Taxus stent in RCA -- covered proximally with Cypher DES 3.0 mm x 12 mm  . PERCUTANEOUS CORONARY STENT INTERVENTION (PCI-S) N/A 05/17/2014   Procedure: PERCUTANEOUS CORONARY STENT INTERVENTION (PCI-S);  Surgeon: Sinclair Grooms, MD;  Location: [facility] CATH LAB: PCI pRCA stent ISR - Promus DES 3.0 x 8 (3.25), OM distal stent edge - Promus DES 2.75 x 12 (3.1)  . PERIPHERAL VASCULAR CATHETERIZATION  11/03/2016   Procedure: Embolization;  Surgeon: Waynetta Sandy, MD;  Location: [place] CV LAB;  Service: Cardiovascular;;  . POSTERIOR CERVICAL FUSION/FORAMINOTOMY N/A 05/02/2013   Procedure: POSTERIOR CERVICAL FUSION/FORAMINOTOMY CERVICAL SEVEN THORACIC-ONE;  Surgeon: Eustace Moore, MD;  Location: [place] [facility];  Service: Neurosurgery;   Laterality: N/A;  POSTERIOR CERVICAL FUSION/FORAMINOTOMY CERVICAL SEVEN THORACIC-ONE  . SPHINCTEROTOMY N/A 06/30/2015   Procedure: SPHINCTEROTOMY;  Surgeon: Ladene Artist, MD;  Location: [facility];  Service: Endoscopy;  Laterality: N/A;  . TOTAL KNEE ARTHROPLASTY Right   . TOTAL KNEE ARTHROPLASTY Left 12/20/2016   Procedure: LEFT TOTAL KNEE ARTHROPLASTY;  Surgeon: Vickey Huger, MD;  Location: [place];  Service: Orthopedics;  Laterality: Left;  . TRANSTHORACIC ECHOCARDIOGRAM  December 2013; October 2016   a. EF 55-60%. mod LA dilation. Aortic Sclerosis;; b. EF 55-60%, Gr 1 DD, Mild-Mod AS (Peark - Mean Gradient 18-10 mmHg)  . TRANSTHORACIC ECHOCARDIOGRAM  07/2016   EF 45-50%. Inferolateral hypokinesis (correlates with scar noted on Myoview). GR 1 DD. Mild aortic stenosis. Mod LAE.  Marland Kitchen VISCERAL ANGIOGRAM  11/03/2016   Procedure: Visceral Angiogram;  Surgeon: Waynetta Sandy, MD;  Location: [place] CV LAB;  Service: Cardiovascular;;   Social History Social History   Tobacco Use  . Smoking status: Former Smoker    Types: Cigarettes    Last attempt to quit: 10/05/1983    Years since quitting: 34.0  . Smokeless tobacco: Never Used  Substance Use Topics  . Alcohol use: Yes    Alcohol/week: 0.6 oz    Types: 1 Glasses of wine per week    Comment:  4 oz. wine daily  . Drug use: No   Family History Family History  Problem Relation Age of Onset  . Arthritis Mother   . Diabetes Father   . Heart disease Father   . Stroke Sister   . Hypertension Sister   . Heart disease Sister   . Diabetes Sister   . Breast cancer Sister   . Heart disease Brother   . Ulcers Brother   . Colon cancer Neg Hx    Current Outpatient Medications on File Prior to Visit  Medication Sig Dispense Refill  . acetaminophen (TYLENOL) 500 MG tablet Take 1,000 mg by mouth every 6 (six) hours as needed for mild pain, moderate pain or headache. Reported on 03/16/2016    . albuterol (PROVENTIL HFA;VENTOLIN HFA)  108 (90 Base) MCG/ACT inhaler Inhale 2 puffs into the lungs every 6 (six) hours as needed for wheezing or shortness of breath. 1 Inhaler 0  . amLODipine (NORVASC) 5 MG tablet TAKE 5 MG BY MOUTH DAILY....need appointment before further refills (Patient taking differently: Take 5 mg by mouth daily. ) 90 tablet 0  . aspirin EC 81 MG EC tablet Take 1 tablet (81 mg total) by mouth daily. 30 tablet 11  . Choline Fenofibrate (FENOFIBRIC ACID) 135 MG CPDR TAKE 1 CAPSULE BY MOUTH ONCE DAILY 90 capsule 0  . clopidogrel (PLAVIX) 75 MG tablet TAKE ONE TABLET BY MOUTH ONCE DAILY (Patient taking differently: TAKE 75mg  BY MOUTH ONCE DAILY) 90 tablet 3  . Coenzyme Q10 (COQ10) 100 MG CAPS Take 100 mg by mouth 3 (three) times daily with meals. 30 each 11  . ezetimibe (ZETIA) 10 MG tablet Take 1 tablet (10 mg total) by mouth daily. 30 tablet 6  . ferrous sulfate 325 (65 FE) MG tablet Take 325 mg by mouth daily with breakfast.    . glucose blood (ONETOUCH VERIO) test strip USE ONE STRIP TO CHECK GLUCOSE TWICE DAILY E11.9 180 each 2  . losartan (COZAAR) 100 MG tablet TAKE 1 TABLET BY MOUTH ONCE DAILY 90 tablet 0  . metFORMIN (GLUCOPHAGE) 500 MG tablet Take 1 tablet (500 mg total) by mouth daily with breakfast. 90 tablet 1  . Multiple Vitamins-Minerals (MULTIVITAMIN WITH MINERALS) tablet Take 1 tablet by mouth daily.     . nitroGLYCERIN (NITROSTAT) 0.4 MG SL tablet Place 1 tablet (0.4 mg total) under the tongue every 5 (five) minutes x 3 doses as needed for chest pain. 25 tablet 12  . pantoprazole (PROTONIX) 40 MG tablet TAKE ONE TABLET BY MOUTH ONCE DAILY (Patient taking  differently: TAKE 40mg  BY MOUTH ONCE DAILY) 90 tablet 3  . promethazine-dextromethorphan (PROMETHAZINE-DM) 6.25-15 MG/5ML syrup Take 5 mLs by mouth 4 (four) times daily as needed for cough. 120 mL 0  . ranolazine (RANEXA) 500 MG 12 hr tablet Take 1 tablet (500 mg total) by mouth 2 (two) times daily. 60 tablet 5  . rosuvastatin (CRESTOR) 20 MG tablet Take  1 tablet (20 mg total) by mouth daily. 90 tablet 3  . sildenafil (REVATIO) 20 MG tablet TAKE 2-5 TABLETS BY MOUTH DAILY AS NEEDED (Patient taking differently: TAKE 40-100 BY MOUTH DAILY AS NEEDED FOR ERECTILE DYSFUNCTION) 50 tablet 5   No current facility-administered medications on file prior to visit.    Allergies  Allergen Reactions  . Oxycodone Shortness Of Breath and Cough  . Lisinopril Cough  . Statins Other (See Comments)    MYALGIAS   . Welchol [Colesevelam Hcl] Itching     ROS: See HPI for pertinent positives and negatives.  Physical Examination  Vitals:   10/21/17 0831 10/21/17 0836  BP: (!) 178/83 (!) 160/84  Pulse: 69 70  Resp: 18   Temp: (!) 96.8 F (36 C)   SpO2: 98%   Weight: 220 lb (99.8 kg)   Height: 5\' 9"  (1.753 m)    Body mass index is 32.49 kg/m.   General: A&O x 3, WD, obese male  HEENT: No gross abnormalities   Pulmonary: Sym exp, respirations are non labored, limited air movement in all fields, rales in lower half of left base, no rhonchi or wheezing.   Cardiac: Regular rhythm and rate, no murmur appreciated  Vascular: Vessel Right Left  Radial 1+Palpable 2+Palpable  Carotid  without bruit  without bruit  Aorta Not palpable N/A  Femoral 2+Palpable 2+Palpable  Popliteal Not palpable Not palpable  PT notPalpable notPalpable  DP 2+Palpable notPalpable   Gastrointestinal: soft, NTND, -G/R, - HSM, - palpable masses, - CVAT B.  Musculoskeletal: M/S 5/5 throughout, extremities without ischemic changes. 1-2+ bilateral pretibial pitting edema.  Skin: No rashes, no ulcers, no cellulitis.    Neurologic: Pain and light touch intact in extremities, Motor exam as listed above. CN 2-12 intact except is hard of hearing.   Psychiatric: Normal thought content, mood appropriate for clinical situation.     DATA  EVAR Duplex (Date: 10/21/17):  AAA sac size: 5.0 cm x 4.3 cm; Right CIA: 1.4 cm; Left CIA: 1.5 cm. Limitations to exam were air/bowel  gas.   no endoleak detected  CTA Abd/Pelvis (03-21-17): IMPRESSION: VASCULAR 1. Significant interval improvement in the conspicuity of the type 2 endoleak following coil embolization of the right L4 lumbar artery. There is no more evidence of contrast enhancement posterior to the aortic limbs. Only a trace amount of contrast enhancement can be seen within the excluded aneurysm sac anterior to the limbs. The aneurysm sac itself remains stable at 5.6 x 4.5 cm in diameter. 2. Prior changes of endovascular aortic repair and coil embolization of the IMA and right L4 lumbar artery. 3. 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 4. Coronary artery calcifications.    Medical Decision Making  TASHA JINDRA is a 83 y.o. male who presents s/p EVAR (Date: 08-13-15).  Pt is asymptomatic with decrease in sac size today at 5.0 cm, compared to CTA on 03-21-17 at 5.6 cm.  I discussed with the patient the importance of surveillance of the endograft.  The next  endograft duplex will be scheduled for 6 months.  The patient will follow up with [place] in 6 months with these studies.  Dependent edema of lower legs: I advised pt to wear knee high compression hose during the day; see Patient Information.   I emphasized the importance of maximal medical management including strict control of blood pressure, blood glucose, and lipid levels, antiplatelet agents, obtaining regular exercise, and cessation of smoking.   Thank you for allowing [place] to participate in this patient's care.  Clemon Chambers, RN, MSN, FNP-C [facility] [place] Office: [phone]  Clinic Physician: Bridgett Larsson  10/21/2017, 8:51 AM

## 2017-10-21 NOTE — Progress Notes (Signed)
Vitals:   10/21/17 0831  BP: (!) 178/83  Pulse: 69  Resp: 18  Temp: (!) 96.8 F (36 C)  SpO2: 98%  Weight: 220 lb (99.8 kg)  Height: 5\' 9"  (1.753 m)

## 2017-10-21 NOTE — Patient Instructions (Addendum)
Before your next abdominal ultrasound:  Take two Extra-Strength Gas-X capsules at bedtime the night before the test. Take another two Extra-Strength Gas-X capsules 3 hours before the test.  Avoid gas forming foods the day before the test.      To measure for knee high compression hose: Measure the length of calf (from the crease of the knee to the bottom of the heel), largest circumference of calf, and ankle circumference first thing in the morning before your legs have a chance to swell.  Take these 3 measurements with you to obtain 20-30 mm mercury graduated knee high compression hose.  Put the stockings on in the morning, remove at bedtime.     . Edema Edema is when you have too much fluid in your body or under your skin. Edema may make your legs, feet, and ankles swell up. Swelling is also common in looser tissues, like around your eyes. This is a common condition. It gets more common as you get older. There are many possible causes of edema. Eating too much salt (sodium) and being on your feet or sitting for a long time can cause edema in your legs, feet, and ankles. Hot weather may make edema worse. Edema is usually painless. Your skin may look swollen or shiny. Follow these instructions at home:  Keep the swollen body part raised (elevated) above the level of your heart when you are sitting or lying down.  Do not sit still or stand for a long time.  Do not wear tight clothes. Do not wear garters on your upper legs.  Exercise your legs. This can help the swelling go down.  Wear elastic bandages or support stockings as told by your doctor.  Eat a low-salt (low-sodium) diet to reduce fluid as told by your doctor.  Depending on the cause of your swelling, you may need to limit how much fluid you drink (fluid restriction).  Take over-the-counter and prescription medicines only as told by your doctor. Contact a doctor if:  Treatment is not working.  You have heart, liver,  or kidney disease and have symptoms of edema.  You have sudden and unexplained weight gain. Get help right away if:  You have shortness of breath or chest pain.  You cannot breathe when you lie down.  You have pain, redness, or warmth in the swollen areas.  You have heart, liver, or kidney disease and get edema all of a sudden.  You have a fever and your symptoms get worse all of a sudden. Summary  Edema is when you have too much fluid in your body or under your skin.  Edema may make your legs, feet, and ankles swell up. Swelling is also common in looser tissues, like around your eyes.  Raise (elevate) the swollen body part above the level of your heart when you are sitting or lying down.  Follow your doctor's instructions about diet and how much fluid you can drink (fluid restriction). This information is not intended to replace advice given to you by your health care provider. Make sure you discuss any questions you have with your health care provider. Document Released: 03/08/2008 Document Revised: 10/08/2016 Document Reviewed: 10/08/2016 Elsevier Interactive Patient Education  2017 [employer].

## 2017-10-22 ENCOUNTER — Other Ambulatory Visit: Payer: Self-pay | Admitting: Cardiovascular Disease

## 2017-10-24 ENCOUNTER — Other Ambulatory Visit: Payer: Self-pay | Admitting: *Deleted

## 2017-10-24 ENCOUNTER — Other Ambulatory Visit: Payer: Self-pay

## 2017-10-24 MED ORDER — RANOLAZINE ER 500 MG PO TB12: 500.0000 mg | ORAL_TABLET | Freq: Two times a day (BID) | ORAL | 8 refills | Status: DC

## 2017-10-26 ENCOUNTER — Encounter: Payer: Self-pay | Admitting: *Deleted

## 2017-10-26 ENCOUNTER — Ambulatory Visit
Admission: RE | Admit: 2017-10-26 | Discharge: 2017-10-26 | Disposition: A | Discharge status: Home / Self Care | Payer: Medicare Other | Source: Ambulatory Visit | Attending: Interventional Radiology | Admitting: Interventional Radiology

## 2017-10-26 DIAGNOSIS — T82330D Leakage of aortic (bifurcation) graft (replacement), subsequent encounter: Secondary | ICD-10-CM

## 2017-10-26 DIAGNOSIS — Z9889 Other specified postprocedural states: Secondary | ICD-10-CM | POA: Diagnosis not present

## 2017-10-26 DIAGNOSIS — IMO0001 Reserved for inherently not codable concepts without codable children: Secondary | ICD-10-CM

## 2017-10-26 DIAGNOSIS — T82330S Leakage of aortic (bifurcation) graft (replacement), sequela: Secondary | ICD-10-CM | POA: Diagnosis not present

## 2017-10-26 HISTORY — PX: IR RADIOLOGIST EVAL & MGMT: IMG5224

## 2017-10-26 NOTE — Progress Notes (Signed)
Chief Complaint: Patient was seen in consultation today for  Chief Complaint  Patient presents with  . Follow-up    12 mo follow up Embolization of TYpe 2 Endoleak   at the request of Durward Matranga  Referring Physician(s): Laithan Conchas  History of Present Illness: [name] is a 82 y.o. 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 His follow-up CT scan last summer demonstrated stable size of aneurysm sac and marked improvement in the endoleak with a tiny subtle persistent endoleak. He returns for a six-month follow-up visit and did undergo an abdominal aortic ultrasound or the ultrasound was somewhat limited by bowel gas but demonstrated an aneurysm sac diameter of 5.0 cm. He denies any complaints in his abdomen or groins.  Past Medical History:  Diagnosis Date  . Adenomatous colon polyp   . Ankle edema    Chronic  . Asthma    as a child  . CAD S/P percutaneous coronary angioplasty 06/2003, 12/2005, 05/2014   a) '04: Staged Taxus DES PCI to RCA (2 Taxus 2.75 x 32 & 12) and Cx-OM (Taxus 3 x 20);;'07 - PCI to pRCA ISR- Cypher DES; c) 05/2014: pPCI pRCA stentISR (Promus DES 3 x 8) OM1 distal to stent (Promus DES 2.75 x 12 - 3.1)  . CAD, multiple vessel 1985   Most recent July 2018: Admitted for non-STEMI. Occluded LAD with patent LIMA (SP1 now occluded).  Patent stents in proximal and mid RCA as well as circumflex-OM 3. Known occlusion of SVG-OM. EF 45-50%  . Cholelithiasis - with cholangitis & choledocholithiasis    status post ERCP with removal of calculi and biliary stent placement.  . Chronic anemia    On iron supplement; history of positive guaiac - negative colonoscopy in 1996.; Thought to be related to hemorrhoids; status post hemorrhoidectomy  . Chronic back pain     multiple surgeries; C-spine and lumbar  . Diabetes mellitus    on oral medication  . Diverticulitis of colon 1996  . Diverticulosis   .  Dyslipidemia, goal LDL below 70   . Erectile dysfunction   . Exertional dyspnea    Chronic baseline SOB with ambulation  . GERD (gastroesophageal reflux disease)   . H/O: pneumonia February 14  . Hemorrhoids   . Hiatal hernia   . History of: ST elevation myocardial infarction (STEMI) involving left circumflex coronary artery with complication 6378   PTCA-circumflex; PCI in 1991  . Hypertension   . Moderate aortic stenosis by prior echocardiogram 07/2015   Normal LV function - EF 55-60%.. Abnormal relaxation. Mild-moderate aortic stenosis (peak/mean gradient 18/10 mmH)  . Osteoarthritis of both knees    And back; multiple back surgeries, right knee arthroplasty and left knee arthroscopic surgery x2  . Pneumonia 2016  . S/P AAA (abdominal aortic aneurysm) repair 08/30/2012   s/p EVAR  . S/P CABG x 2 1997   LIMA-LAD, SVG-OM  . Sleep apnea    pt. states he was told to return for f/u, to be fitted for Cpap, but pt. reports that he didn't follow up-no cpap used    Past Surgical History:  Procedure Laterality Date  . Abdominal and Lower Extremity Arterial Ultrasound  08/23/2012; 10/10/2013   Normal ABIs. Nonocclusive lower extremity disease. 4.2 cm x 4.3 cm infrarenal AAA;; 4.4 cm x 4.3 cm (essentially stable)   . ABDOMINAL AORTIC ENDOVASCULAR STENT GRAFT N/A 08/13/2015   Procedure: ABDOMINAL AORTIC ENDOVASCULAR STENT GRAFT;  Surgeon: Mal Misty, MD;  Location: [facility];  Service: Vascular;  Laterality: N/A;  . Anterior cervical plating  04/23/10   At C4-5 and a C6-7 utilizing two separate Biomet MaxAn plates.  . ANTERIOR LAT LUMBAR FUSION Left 11/22/2012   Procedure: ANTERIOR LATERAL LUMBAR FUSION 1 LEVEL;  Surgeon: Eustace Moore, MD;  Location: [place] [facility];  Service: Neurosurgery;  Laterality: Left;  Anterior Lateral Lumbar Fusion Lumbar Three-Four  . BACK SURGERY  1979 & x 10   pt. remarks, "I have had about 10 back surgeries"  . BILIARY STENT PLACEMENT N/A 07/03/2014   Procedure:  BILIARY STENT PLACEMENT;  Surgeon: Inda Castle, MD;  Location: [place];  Service: Endoscopy;  Laterality: N/A;  . CARDIAC CATHETERIZATION  September 2004   None Occluded vein graft to OM; diffuse RCA disease in the mid vessel, 80% circumflex-OM stenosis; follow on AV groove circumflex with sequential 90% stenoses and intervening saccular dilation   . CARDIAC CATHETERIZATION  March 2010   4 abnormal Myoview showing apical thinning (possibly due to apical LAD 95%) : 100% Occluded LAD after SV1, distal LAD grafted via LIMA -apical 95% . Cx -OM1 w/patent stent extending into OM 1 . Follow on Cx - 70-80% - non-amenable PCI. RCA widely patent 3 overlapping stents in mRCA w/less than 40% stenosisin RPL; SVG-OM known occluded   . CATARACT EXTRACTION    . Cervical arthrodesis  04/23/10   Anterior cervical arthrodesis, C4-5, C6-7 utilizing 7-mm PEEK interbody cage packed with local autograft & Antifuse putty at C4-5 & an 8-mm cage at C6-7.  Marland Kitchen CERVICAL DISCECTOMY  04/23/10   Decompressive anterior carvical diskectomy. C4-5, C6-7  . CHOLECYSTECTOMY N/A 05/23/2015   Procedure: LAPAROSCOPIC CHOLECYSTECTOMY WITH INTRAOPERATIVE CHOLANGIOGRAM;  Surgeon: Alphonsa Overall, MD;  Location: [facility];  Service: General;  Laterality: N/A;  . [place]  . CORONARY ANGIOPLASTY  1985,1991,15   1985 lateral STEMI Circumflex PTCA;   . CORONARY ARTERY BYPASS GRAFT  1997   LIMA-LAD, SVG-OM (SVG known to be occluded prior to 2004)  . ERCP N/A 07/03/2014   Procedure: ENDOSCOPIC RETROGRADE CHOLANGIOPANCREATOGRAPHY (ERCP);  Surgeon: Inda Castle, MD;  Location: [place];  Service: Endoscopy;  Laterality: N/A;  . ERCP N/A 07/05/2014   Procedure: ENDOSCOPIC RETROGRADE CHOLANGIOPANCREATOGRAPHY (ERCP);  Surgeon: Inda Castle, MD;  Location: [facility];  Service: Endoscopy;  Laterality: N/A;  . ERCP N/A 06/30/2015   Procedure: ENDOSCOPIC RETROGRADE CHOLANGIOPANCREATOGRAPHY (ERCP);  Surgeon: Ladene Artist, MD;   Location: [facility];  Service: Endoscopy;  Laterality: N/A;  . EYE SURGERY    . IR ANGIOGRAM EXTREMITY LEFT  01/18/2017  . IR ANGIOGRAM PELVIS SELECTIVE OR SUPRASELECTIVE  01/18/2017  . IR ANGIOGRAM SELECTIVE EACH ADDITIONAL VESSEL  01/18/2017  . IR AORTAGRAM ABDOMINAL SERIALOGRAM  01/18/2017  . IR EMBO ARTERIAL NOT HEMORR HEMANG INC GUIDE ROADMAPPING  01/18/2017  . IR RADIOLOGIST EVAL & MGMT  01/04/2017  . IR RADIOLOGIST EVAL & MGMT  10/26/2017  . IR US GUIDE VASC ACCESS RIGHT  01/18/2017  . KNEE ARTHROSCOPY Left    x 2  . LEFT HEART CATH AND CORS/GRAFTS ANGIOGRAPHY N/A 04/20/2017   Procedure: Left Heart Cath and Cors/Grafts Angiography;  Surgeon: Lorretta Harp, MD;  Location: [facility] INVASIVE CV LAB;  LAD now occluded prior to SP1.LIMA-LAD. Patent RCA and circumflex stents. EF 45-50%. Relatively stable.   Marland Kitchen LEFT HEART CATHETERIZATION WITH CORONARY ANGIOGRAM N/A 05/15/2014   Procedure: LEFT HEART CATHETERIZATION WITH CORONARY ANGIOGRAM;  Surgeon: Sinclair Grooms, MD;  Location: [facility] CATH LAB;  Service: Cardiovascular;  Laterality: N/A;  . LEFT HEART CATHETERIZATION WITH CORONARY/GRAFT ANGIOGRAM N/A 06/28/2014   Procedure: LEFT HEART CATHETERIZATION WITH Beatrix Fetters;  Surgeon: Troy Sine, MD;  Location: [facility] CATH LAB;  Service: Cardiovascular;  Laterality: N/A;  . LUMBAR PERCUTANEOUS PEDICLE SCREW 1 LEVEL N/A 11/22/2012   Procedure: LUMBAR PERCUTANEOUS PEDICLE SCREW 1 LEVEL;  Surgeon: Eustace Moore, MD;  Location: [place] [facility];  Service: Neurosurgery;  Laterality: N/A;  Lumbar Three-Four Percutaneous Pedicle Screw, Lateral approach  . MINOR HEMORRHOIDECTOMY    . NM MYOVIEW LTD  December 2013   LOW RISK. Mmoderate region of mid to basal inferolateral scar without ischemia. Mild apical hypokinesis with an EF of 47%.  Marland Kitchen PERCUTANEOUS CORONARY STENT INTERVENTION (PCI-S)  September 2004   PCI - RCA 2 overlapping Taxus DES 2.75 mm x 32 mm and 2.75 mm x 12 mm (3.0 mm); PCI-Cx-OM1 - Taxus DES 3.0  mm x 20 mm (3.1 mm);   Marland Kitchen PERCUTANEOUS CORONARY STENT INTERVENTION (PCI-S)  12/2005   80% ISR in proximal Taxus stent in RCA -- covered proximally with Cypher DES 3.0 mm x 12 mm  . PERCUTANEOUS CORONARY STENT INTERVENTION (PCI-S) N/A 05/17/2014   Procedure: PERCUTANEOUS CORONARY STENT INTERVENTION (PCI-S);  Surgeon: Sinclair Grooms, MD;  Location: [facility] CATH LAB: PCI pRCA stent ISR - Promus DES 3.0 x 8 (3.25), OM distal stent edge - Promus DES 2.75 x 12 (3.1)  . PERIPHERAL VASCULAR CATHETERIZATION  11/03/2016   Procedure: Embolization;  Surgeon: Waynetta Sandy, MD;  Location: [place] CV LAB;  Service: Cardiovascular;;  . POSTERIOR CERVICAL FUSION/FORAMINOTOMY N/A 05/02/2013   Procedure: POSTERIOR CERVICAL FUSION/FORAMINOTOMY CERVICAL SEVEN THORACIC-ONE;  Surgeon: Eustace Moore, MD;  Location: [place] [facility];  Service: Neurosurgery;  Laterality: N/A;  POSTERIOR CERVICAL FUSION/FORAMINOTOMY CERVICAL SEVEN THORACIC-ONE  . SPHINCTEROTOMY N/A 06/30/2015   Procedure: SPHINCTEROTOMY;  Surgeon: Ladene Artist, MD;  Location: [facility];  Service: Endoscopy;  Laterality: N/A;  . TOTAL KNEE ARTHROPLASTY Right   . TOTAL KNEE ARTHROPLASTY Left 12/20/2016   Procedure: LEFT TOTAL KNEE ARTHROPLASTY;  Surgeon: Vickey Huger, MD;  Location: [place];  Service: Orthopedics;  Laterality: Left;  . TRANSTHORACIC ECHOCARDIOGRAM  December 2013; October 2016   a. EF 55-60%. mod LA dilation. Aortic Sclerosis;; b. EF 55-60%, Gr 1 DD, Mild-Mod AS (Peark - Mean Gradient 18-10 mmHg)  . TRANSTHORACIC ECHOCARDIOGRAM  07/2016   EF 45-50%. Inferolateral hypokinesis (correlates with scar noted on Myoview). GR 1 DD. Mild aortic stenosis. Mod LAE.  Marland Kitchen VISCERAL ANGIOGRAM  11/03/2016   Procedure: Visceral Angiogram;  Surgeon: Waynetta Sandy, MD;  Location: [place] CV LAB;  Service: Cardiovascular;;    Allergies: Oxycodone; Lisinopril; Statins; and Welchol [colesevelam hcl]  Medications: Prior to Admission  medications   Medication Sig Start Date End Date Taking? Authorizing Provider  acetaminophen (TYLENOL) 500 MG tablet Take 1,000 mg by mouth every 6 (six) hours as needed for mild pain, moderate pain or headache. Reported on 03/16/2016   Yes [provider]  albuterol (PROVENTIL HFA;VENTOLIN HFA) 108 (90 Base) MCG/ACT inhaler Inhale 2 puffs into the lungs every 6 (six) hours as needed for wheezing or shortness of breath. 01/12/17  Yes Denita Lung, MD  amLODipine (NORVASC) 5 MG tablet TAKE 5 MG BY MOUTH DAILY....need appointment before further refills Patient taking differently: Take 5 mg by mouth daily.  09/07/16  Yes Leonie Man,  MD  aspirin EC 81 MG EC tablet Take 1 tablet (81 mg total) by mouth daily. 04/23/17  Yes Daune Perch, NP  Choline Fenofibrate (FENOFIBRIC ACID) 135 MG CPDR TAKE 1 CAPSULE BY MOUTH ONCE DAILY 09/22/17  Yes Leonie Man, MD  clopidogrel (PLAVIX) 75 MG tablet TAKE ONE TABLET BY MOUTH ONCE DAILY Patient taking differently: TAKE 75mg  BY MOUTH ONCE DAILY 02/11/17  Yes Leonie Man, MD  Coenzyme Q10 (COQ10) 100 MG CAPS Take 100 mg by mouth 3 (three) times daily with meals. 10/13/17  Yes Leonie Man, MD  ezetimibe (ZETIA) 10 MG tablet Take 1 tablet (10 mg total) by mouth daily. 05/06/17 10/26/17 Yes Leonie Man, MD  ferrous sulfate 325 (65 FE) MG tablet Take 325 mg by mouth daily with breakfast.   Yes [provider]  glucose blood (ONETOUCH VERIO) test strip USE ONE STRIP TO CHECK GLUCOSE TWICE DAILY E11.9 06/15/17  Yes Denita Lung, MD  losartan (COZAAR) 100 MG tablet TAKE 1 TABLET BY MOUTH ONCE DAILY 09/22/17  Yes Leonie Man, MD  metFORMIN (GLUCOPHAGE) 500 MG tablet Take 1 tablet (500 mg total) by mouth daily with breakfast. 07/20/17  Yes Denita Lung, MD  Multiple Vitamins-Minerals (MULTIVITAMIN WITH MINERALS) tablet Take 1 tablet by mouth daily.    Yes [provider]  nitroGLYCERIN (NITROSTAT) 0.4 MG SL tablet Place  1 tablet (0.4 mg total) under the tongue every 5 (five) minutes x 3 doses as needed for chest pain. 04/22/17  Yes Daune Perch, NP  pantoprazole (PROTONIX) 40 MG tablet TAKE ONE TABLET BY MOUTH ONCE DAILY Patient taking differently: TAKE 40mg  BY MOUTH ONCE DAILY 02/11/17  Yes Leonie Man, MD  promethazine-dextromethorphan (PROMETHAZINE-DM) 6.25-15 MG/5ML syrup Take 5 mLs by mouth 4 (four) times daily as needed for cough. 09/13/17  Yes Tysinger, Camelia Eng, PA-C  ranolazine (RANEXA) 500 MG 12 hr tablet Take 1 tablet (500 mg total) by mouth 2 (two) times daily. 10/24/17  Yes Leonie Man, MD  rosuvastatin (CRESTOR) 20 MG tablet Take 1 tablet (20 mg total) by mouth daily. 10/13/17 01/11/18 Yes Leonie Man, MD  sildenafil (REVATIO) 20 MG tablet TAKE 2-5 TABLETS BY MOUTH DAILY AS NEEDED Patient taking differently: TAKE 40-100 BY MOUTH DAILY AS NEEDED FOR ERECTILE DYSFUNCTION 02/19/16  Yes Denita Lung, MD     Family History  Problem Relation Age of Onset  . Arthritis Mother   . Diabetes Father   . Heart disease Father   . Stroke Sister   . Hypertension Sister   . Heart disease Sister   . Diabetes Sister   . Breast cancer Sister   . Heart disease Brother   . Ulcers Brother   . Colon cancer Neg Hx     Social History   Socioeconomic History  . Marital status: Married    Spouse name: Not on file  . Number of children: Not on file  . Years of education: Not on file  . Highest education level: Not on file  Social Needs  . Financial resource strain: Not on file  . Food insecurity - worry: Not on file  . Food insecurity - inability: Not on file  . Transportation needs - medical: Not on file  . Transportation needs - non-medical: Not on file  Occupational History  . Not on file  Tobacco Use  . Smoking status: Former Smoker    Types: Cigarettes    Last attempt to quit: 10/05/1983  Years since quitting: 34.0  . Smokeless tobacco: Never Used  Substance and Sexual Activity  .  Alcohol use: Yes    Alcohol/week: 0.6 oz    Types: 1 Glasses of wine per week    Comment:  4 oz. wine daily  . Drug use: No  . Sexual activity: Not Currently  Other Topics Concern  . Not on file  Social History Narrative   He is married, father of two, grandfather to 26, great grandfather to two.    Not really getting much exercise now, do to his significant back and hip pain.    He does not smoke and only has an alcoholic beverage.      Review of Systems: A 12 point ROS discussed and pertinent positives are indicated in the HPI above.  All other systems are negative.  Review of Systems  Vital Signs: BP (!) 167/92   Pulse 71   Temp 97.7 F (36.5 C) (Oral)   Resp 14   Ht 5\' 9"  (1.753 m)   Wt 215 lb (97.5 kg)   SpO2 97%   BMI 31.75 kg/m   Physical Exam  Constitutional: He is oriented to person, place, and time. He appears well-developed and well-nourished.  HENT:  Head: Normocephalic and atraumatic.  Neurological: He is alert and oriented to person, place, and time.  Skin: Skin is warm and dry.       Imaging: Ir Radiologist Eval & Mgmt  Result Date: 10/26/2017 Please refer to notes tab for details about interventional procedure. (Op Note)   Labs:  CBC: Recent Labs    04/20/17 1147 04/21/17 0604 04/22/17 0320 07/20/17 1056  WBC 4.3 4.6 5.2 4.4  HGB 11.3* 10.5* 10.0* 11.0*  HCT 35.0* 32.9* 31.9* 33.0*  PLT 171 148* 158 214    COAGS: Recent Labs    01/14/17 1501 01/18/17 0715 04/20/17 2047  INR 1.10 0.98 0.99    BMP: Recent Labs    01/14/17 1501 01/18/17 0715 04/20/17 1147 04/21/17 0604  NA 137 139 138 137  K 4.0 4.5 4.1 3.9  CL 108 104 110 110  CO2 22 25 22  21*  GLUCOSE 110* 105* 99 83  BUN 20 24* 19 18  CALCIUM 9.2 10.0 9.0 8.7*  CREATININE 1.36* 1.54* 1.43* 1.38*  GFRNONAA 48* 41* 45* 47*  GFRAA 56* 48* 52* 55*    LIVER FUNCTION TESTS: Recent Labs    11/09/16 0922 12/09/16 1047 06/20/17 1216  BILITOT 0.7 0.7 0.5  AST 23  26 27   ALT 19 19 21   ALKPHOS 28* 27* 33*  PROT 6.4 6.7 6.5  ALBUMIN 4.0 3.6 4.0    TUMOR MARKERS: No results for input(s): AFPTM, CEA, CA199, CHROMGRNA in the last 8760 hours.  Assessment and Plan:  Mr. Worton is doing well after type II endoleak treatment. By ultrasound, his aneurysm sac is slightly smaller. He will follow-up at the one-year interval this summer with a CT angiogram of his aorta.  Thank you for this interesting consult.  I greatly enjoyed meeting [name] and look forward to participating in their care.  A copy of this report was sent to the requesting provider on this date.  Electronically Signed: Chantel Teti, ART A 10/26/2017, 12:39 PM   I spent a total of   10 Minutes in face to face in clinical consultation, greater than 50% of which was counseling/coordinating care for type II endoleak.

## 2017-11-02 NOTE — Addendum Note (Signed)
Addended by: Lianne Cure A on: 11/02/2017 12:34 PM   Modules accepted: Orders

## 2017-11-21 ENCOUNTER — Other Ambulatory Visit: Payer: Self-pay | Admitting: Cardiology

## 2017-11-28 ENCOUNTER — Other Ambulatory Visit: Payer: Self-pay | Admitting: Family Medicine

## 2017-11-28 NOTE — Telephone Encounter (Signed)
Pharmacy is requesting sildenafil  To be fiiled at [facility] drug. Please adsvise.

## 2017-12-14 ENCOUNTER — Other Ambulatory Visit: Payer: Self-pay | Admitting: Cardiology

## 2017-12-16 ENCOUNTER — Ambulatory Visit (INDEPENDENT_AMBULATORY_CARE_PROVIDER_SITE_OTHER): Payer: Medicare Other | Admitting: Orthopaedic Surgery

## 2017-12-16 ENCOUNTER — Ambulatory Visit (INDEPENDENT_AMBULATORY_CARE_PROVIDER_SITE_OTHER): Payer: Medicare Other

## 2017-12-16 ENCOUNTER — Encounter (INDEPENDENT_AMBULATORY_CARE_PROVIDER_SITE_OTHER): Payer: Self-pay | Admitting: Orthopaedic Surgery

## 2017-12-16 VITALS — BP 159/84 | HR 86 | Resp 18 | Ht 69.0 in | Wt 219.0 lb

## 2017-12-16 DIAGNOSIS — M25572 Pain in left ankle and joints of left foot: Secondary | ICD-10-CM

## 2017-12-16 DIAGNOSIS — I25119 Atherosclerotic heart disease of native coronary artery with unspecified angina pectoris: Secondary | ICD-10-CM | POA: Diagnosis not present

## 2017-12-16 NOTE — Progress Notes (Signed)
Office Visit Note   Patient: [name]           Date of Birth: 09-12-1936           MRN: [id number] Visit Date: 12/16/2017              Requested by: Denita Lung, MD [place], [place] [id number] PCP: Denita Lung, MD   Assessment & Plan: Visit Diagnoses:  1. Pain in left ankle and joints of left foot     Plan: Diabetic peripheral vascular neuropathy disease.  Stress fracture of the distal fibula.  Apply equalizer boot and reevaluate in 3 weeks.  Follow-Up Instructions: Return in about 3 weeks (around 01/06/2018).   Orders:  Orders Placed This Encounter  Procedures  . XR Ankle Complete Left   No orders of the defined types were placed in this encounter.     Procedures: No procedures performed   Clinical Data: No additional findings.   Subjective: Chief Complaint  Patient presents with  . Left Ankle - Pain    [name] IS 82 Y O M HERE FOR LEFT ANKLE PAIN THAT STARTED YESTERDAY, NO INJURY.  No obvious history of injury or trauma.  Getting up from a chair recently and felt onset of pain.  He has had some swelling more laterally than medially.  Has a history of diabetes since early 2000 with diabetic peripheral neuropathy.  HPI  Review of Systems  Constitutional: Negative for fatigue and fever.  HENT: Negative for ear pain.   Eyes: Negative for pain.  Respiratory: Positive for cough. Negative for shortness of breath.   Cardiovascular: Positive for leg swelling.  Gastrointestinal: Negative for blood in stool, constipation and diarrhea.  Genitourinary: Negative for dysuria.  Musculoskeletal: Positive for back pain and neck pain.  Skin: Negative for rash and wound.  Allergic/Immunologic: Negative for food allergies.  Neurological: Positive for weakness and numbness. Negative for dizziness, light-headedness and headaches.  Psychiatric/Behavioral: Negative for sleep disturbance.     Objective: Vital Signs: BP (!) 159/84 (BP  Location: Left Arm, Patient Position: Sitting, Cuff Size: Normal)   Pulse 86   Resp 18   Ht 5\' 9"  (1.753 m)   Wt 219 lb (99.3 kg)   BMI 32.34 kg/m   Physical Exam  Ortho Exam awake alert and oriented x3.  Comfortable sitting mild edema diffusely about the anterior left ankle.  Altered sensibility consistent with his diabetic neuropathy.  I could not feel up dorsalis pedis or posterior tib pulse.  It was warm.  It is along the lateral malleolus to a lesser extent the medial list.  List dorsiflexion and plantarflexion of ankle which is minimally limited  Specialty Comments:  No specialty comments available.  Imaging: Xr Ankle Complete Left  Result Date: 12/16/2017 Echo obtained in several projections.  There is diffuse calcification of the posterior tibial artery consistent with diabetic peripheral vascular disease.  As of ectopic calcification of the tip of the medial malleolus lateral malleolus consistent with old injuries.  There might be a new nondisplaced fracture of the very distal portion of the fibula .some degenerative change in the ankle is identified and there might be a small osteochondral lesion of the lateral talar dome.    PMFS History: Patient Active Problem List   Diagnosis Date Noted  . Senile purpura ([address]) 07/20/2017  . S/P total knee replacement 12/20/2016  . Endoleak post (EVAR) endovascular aneurysm repair ([place]) 03/16/2016  . Moderate aortic stenosis by  prior echocardiogram 02/02/2016  . Statin intolerance 09/30/2015  . Calculus of bile duct with obstruction and without cholangitis or cholecystitis   . Obstruction of biliary stent   . History of unstable angina 01/08/2015  . Presence of drug coated stent in right coronary artery and circumflex coronary artery 01/08/2015  . Chronic renal insufficiency, stage III (moderate) ([place]) 06/06/2014  . Coronary stent restenosis with uncertain cause: Required PCI for ISR of OM1 (Promus P 2.75 mm x 12 mm) & pRCA 05/17/2014    . Obesity (BMI 30-39.9) 06/28/2013  . Encounter for long-term (current) use of medications 06/28/2013  . ACE-inhibitor cough 04/16/2013  . Chronic diastolic CHF (congestive heart failure) ([place]) 11/24/2012  . Type II diabetes mellitus with complication -- CAD, AAA 03/04/2011    Class: Diagnosis of  . Hypertensive heart disease with chronic diastolic congestive heart failure ([place]) 03/04/2011  . Hyperlipidemia LDL goal <70; statin intolerant 03/04/2011    Class: Diagnosis of  . ED (erectile dysfunction) of organic origin 03/04/2011  . GERD (gastroesophageal reflux disease) 03/04/2011  . S/P CABG x 2: 1997. SVG-OM (known to be occluded), LIMA-LAD 06/28/1996  . History of ST elevation myocardial infarction (STEMI) of inferolateral wall (PTCA - 100% large lateral OM) 06/28/1996    Class: History of  . CAD S/P CABG '95- several PCis since, last 05/17/14 06/28/1994    Class: History of   Past Medical History:  Diagnosis Date  . Adenomatous colon polyp   . Ankle edema    Chronic  . Asthma    as a child  . CAD S/P percutaneous coronary angioplasty 06/2003, 12/2005, 05/2014   a) '04: Staged Taxus DES PCI to RCA (2 Taxus 2.75 x 32 & 12) and Cx-OM (Taxus 3 x 20);;'07 - PCI to pRCA ISR- Cypher DES; c) 05/2014: pPCI pRCA stentISR (Promus DES 3 x 8) OM1 distal to stent (Promus DES 2.75 x 12 - 3.1)  . CAD, multiple vessel 1985   Most recent July 2018: Admitted for non-STEMI. Occluded LAD with patent LIMA (SP1 now occluded).  Patent stents in proximal and mid RCA as well as circumflex-OM 3. Known occlusion of SVG-OM. EF 45-50%  . Cholelithiasis - with cholangitis & choledocholithiasis    status post ERCP with removal of calculi and biliary stent placement.  . Chronic anemia    On iron supplement; history of positive guaiac - negative colonoscopy in 1996.; Thought to be related to hemorrhoids; status post hemorrhoidectomy  . Chronic back pain     multiple surgeries; C-spine and lumbar  . Diabetes  mellitus    on oral medication  . Diverticulitis of colon 1996  . Diverticulosis   . Dyslipidemia, goal LDL below 70   . Erectile dysfunction   . Exertional dyspnea    Chronic baseline SOB with ambulation  . GERD (gastroesophageal reflux disease)   . H/O: pneumonia February 14  . Hemorrhoids   . Hiatal hernia   . History of: ST elevation myocardial infarction (STEMI) involving left circumflex coronary artery with complication 8756   PTCA-circumflex; PCI in 1991  . Hypertension   . Moderate aortic stenosis by prior echocardiogram 07/2015   Normal LV function - EF 55-60%.. Abnormal relaxation. Mild-moderate aortic stenosis (peak/mean gradient 18/10 mmH)  . Osteoarthritis of both knees    And back; multiple back surgeries, right knee arthroplasty and left knee arthroscopic surgery x2  . Pneumonia 2016  . S/P AAA (abdominal aortic aneurysm) repair 08/30/2012   s/p EVAR  . S/P  CABG x 2 1997   LIMA-LAD, SVG-OM  . Sleep apnea    pt. states he was told to return for f/u, to be fitted for Cpap, but pt. reports that he didn't follow up-no cpap used    Family History  Problem Relation Age of Onset  . Arthritis Mother   . Diabetes Father   . Heart disease Father   . Stroke Sister   . Hypertension Sister   . Heart disease Sister   . Diabetes Sister   . Breast cancer Sister   . Heart disease Brother   . Ulcers Brother   . Colon cancer Neg Hx     Past Surgical History:  Procedure Laterality Date  . Abdominal and Lower Extremity Arterial Ultrasound  08/23/2012; 10/10/2013   Normal ABIs. Nonocclusive lower extremity disease. 4.2 cm x 4.3 cm infrarenal AAA;; 4.4 cm x 4.3 cm (essentially stable)   . ABDOMINAL AORTIC ENDOVASCULAR STENT GRAFT N/A 08/13/2015   Procedure: ABDOMINAL AORTIC ENDOVASCULAR STENT GRAFT;  Surgeon: Mal Misty, MD;  Location: [facility];  Service: Vascular;  Laterality: N/A;  . Anterior cervical plating  04/23/10   At C4-5 and a C6-7 utilizing two separate Biomet MaxAn  plates.  . ANTERIOR LAT LUMBAR FUSION Left 11/22/2012   Procedure: ANTERIOR LATERAL LUMBAR FUSION 1 LEVEL;  Surgeon: Eustace Moore, MD;  Location: [facility];  Service: Neurosurgery;  Laterality: Left;  Anterior Lateral Lumbar Fusion Lumbar Three-Four  . BACK SURGERY  1979 & x 10   pt. remarks, "I have had about 10 back surgeries"  . BILIARY STENT PLACEMENT N/A 07/03/2014   Procedure: BILIARY STENT PLACEMENT;  Surgeon: Inda Castle, MD;  Location: [facility];  Service: Endoscopy;  Laterality: N/A;  . CARDIAC CATHETERIZATION  September 2004   None Occluded vein graft to OM; diffuse RCA disease in the mid vessel, 80% circumflex-OM stenosis; follow on AV groove circumflex with sequential 90% stenoses and intervening saccular dilation   . CARDIAC CATHETERIZATION  March 2010   4 abnormal Myoview showing apical thinning (possibly due to apical LAD 95%) : 100% Occluded LAD after SV1, distal LAD grafted via LIMA -apical 95% . Cx -OM1 w/patent stent extending into OM 1 . Follow on Cx - 70-80% - non-amenable PCI. RCA widely patent 3 overlapping stents in mRCA w/less than 40% stenosisin RPL; SVG-OM known occluded   . CATARACT EXTRACTION    . Cervical arthrodesis  04/23/10   Anterior cervical arthrodesis, C4-5, C6-7 utilizing 7-mm PEEK interbody cage packed with local autograft & Antifuse putty at C4-5 & an 8-mm cage at C6-7.  Marland Kitchen CERVICAL DISCECTOMY  04/23/10   Decompressive anterior carvical diskectomy. C4-5, C6-7  . CHOLECYSTECTOMY N/A 05/23/2015   Procedure: LAPAROSCOPIC CHOLECYSTECTOMY WITH INTRAOPERATIVE CHOLANGIOGRAM;  Surgeon: Alphonsa Overall, MD;  Location: [facility];  Service: General;  Laterality: N/A;  . [place]  . CORONARY ANGIOPLASTY  1985,1991,15   1985 lateral STEMI Circumflex PTCA;   . CORONARY ARTERY BYPASS GRAFT  1997   LIMA-LAD, SVG-OM (SVG known to be occluded prior to 2004)  . ERCP N/A 07/03/2014   Procedure: ENDOSCOPIC RETROGRADE CHOLANGIOPANCREATOGRAPHY (ERCP);  Surgeon: Inda Castle, MD;  Location: [facility];  Service: Endoscopy;  Laterality: N/A;  . ERCP N/A 07/05/2014   Procedure: ENDOSCOPIC RETROGRADE CHOLANGIOPANCREATOGRAPHY (ERCP);  Surgeon: Inda Castle, MD;  Location: [facility];  Service: Endoscopy;  Laterality: N/A;  . ERCP N/A 06/30/2015   Procedure: ENDOSCOPIC RETROGRADE CHOLANGIOPANCREATOGRAPHY (ERCP);  Surgeon: Pricilla Riffle  Fuller Plan, MD;  Location: [facility];  Service: Endoscopy;  Laterality: N/A;  . EYE SURGERY    . IR ANGIOGRAM EXTREMITY LEFT  01/18/2017  . IR ANGIOGRAM PELVIS SELECTIVE OR SUPRASELECTIVE  01/18/2017  . IR ANGIOGRAM SELECTIVE EACH ADDITIONAL VESSEL  01/18/2017  . IR AORTAGRAM ABDOMINAL SERIALOGRAM  01/18/2017  . IR EMBO ARTERIAL NOT HEMORR HEMANG INC GUIDE ROADMAPPING  01/18/2017  . IR RADIOLOGIST EVAL & MGMT  01/04/2017  . IR RADIOLOGIST EVAL & MGMT  10/26/2017  . IR US GUIDE VASC ACCESS RIGHT  01/18/2017  . KNEE ARTHROSCOPY Left    x 2  . LEFT HEART CATH AND CORS/GRAFTS ANGIOGRAPHY N/A 04/20/2017   Procedure: Left Heart Cath and Cors/Grafts Angiography;  Surgeon: Lorretta Harp, MD;  [facility] INVASIVE CV [facility];  LAD now occluded prior to SP1.LIMA-LAD. Patent RCA and circumflex stents. EF 45-50%. Relatively stable.   Marland Kitchen LEFT HEART CATHETERIZATION WITH CORONARY ANGIOGRAM N/A 05/15/2014   Procedure: LEFT HEART CATHETERIZATION WITH CORONARY ANGIOGRAM;  Surgeon: Sinclair Grooms, MD;  Location: [facility] CATH LAB;  Service: Cardiovascular;  Laterality: N/A;  . LEFT HEART CATHETERIZATION WITH CORONARY/GRAFT ANGIOGRAM N/A 06/28/2014   Procedure: LEFT HEART CATHETERIZATION WITH Beatrix Fetters;  Surgeon: Troy Sine, MD;  Location: [facility] CATH LAB;  Service: Cardiovascular;  Laterality: N/A;  . LUMBAR PERCUTANEOUS PEDICLE SCREW 1 LEVEL N/A 11/22/2012   Procedure: LUMBAR PERCUTANEOUS PEDICLE SCREW 1 LEVEL;  Surgeon: Eustace Moore, MD;  Location: [facility];  Service: Neurosurgery;  Laterality: N/A;  Lumbar Three-Four Percutaneous Pedicle  Screw, Lateral approach  . MINOR HEMORRHOIDECTOMY    . NM MYOVIEW LTD  December 2013   LOW RISK. Mmoderate region of mid to basal inferolateral scar without ischemia. Mild apical hypokinesis with an EF of 47%.  Marland Kitchen PERCUTANEOUS CORONARY STENT INTERVENTION (PCI-S)  September 2004   PCI - RCA 2 overlapping Taxus DES 2.75 mm x 32 mm and 2.75 mm x 12 mm (3.0 mm); PCI-Cx-OM1 - Taxus DES 3.0 mm x 20 mm (3.1 mm);   Marland Kitchen PERCUTANEOUS CORONARY STENT INTERVENTION (PCI-S)  12/2005   80% ISR in proximal Taxus stent in RCA -- covered proximally with Cypher DES 3.0 mm x 12 mm  . PERCUTANEOUS CORONARY STENT INTERVENTION (PCI-S) N/A 05/17/2014   Procedure: PERCUTANEOUS CORONARY STENT INTERVENTION (PCI-S);  Surgeon: Sinclair Grooms, MD;  Location[facility] CATH LAB: PCI pRCA stent ISR - Promus DES 3.0 x 8 (3.25), OM distal stent edge - Promus DES 2.75 x 12 (3.1)  . PERIPHERAL VASCULAR CATHETERIZATION  11/03/2016   Procedure: Embolization;  Surgeon: Waynetta Sandy, MD;  Location: [facility] CV LAB;  Service: Cardiovascular;;  . POSTERIOR CERVICAL FUSION/FORAMINOTOMY N/A 05/02/2013   Procedure: POSTERIOR CERVICAL FUSION/FORAMINOTOMY CERVICAL SEVEN THORACIC-ONE;  Surgeon: Eustace Moore, MD;  Location: [facility];  Service: Neurosurgery;  Laterality: N/A;  POSTERIOR CERVICAL FUSION/FORAMINOTOMY CERVICAL SEVEN THORACIC-ONE  . SPHINCTEROTOMY N/A 06/30/2015   Procedure: SPHINCTEROTOMY;  Surgeon: Ladene Artist, MD;  Location: [facility];  Service: Endoscopy;  Laterality: N/A;  . TOTAL KNEE ARTHROPLASTY Right   . TOTAL KNEE ARTHROPLASTY Left 12/20/2016   Procedure: LEFT TOTAL KNEE ARTHROPLASTY;  Surgeon: Vickey Huger, MD;  Location: [facility];  Service: Orthopedics;  Laterality: Left;  . TRANSTHORACIC ECHOCARDIOGRAM  December 2013; October 2016   a. EF 55-60%. mod LA dilation. Aortic Sclerosis;; b. EF 55-60%, Gr 1 DD, Mild-Mod AS ([place] - Mean Gradient 18-10 mmHg)  . TRANSTHORACIC ECHOCARDIOGRAM  07/2016  EF 45-50%.  Inferolateral hypokinesis (correlates with scar noted on Myoview). GR 1 DD. Mild aortic stenosis. Mod LAE.  Marland Kitchen VISCERAL ANGIOGRAM  11/03/2016   Procedure: Visceral Angiogram;  Surgeon: Waynetta Sandy, MD;  Location: [facility] CV LAB;  Service: Cardiovascular;;   Social History   Occupational History  . Not on file  Tobacco Use  . Smoking status: Former Smoker    Types: Cigarettes    Last attempt to quit: 10/05/1983    Years since quitting: 34.2  . Smokeless tobacco: Never Used  Substance and Sexual Activity  . Alcohol use: Yes    Alcohol/week: 0.6 oz    Types: 1 Glasses of wine per week    Comment:  4 oz. wine daily  . Drug use: No  . Sexual activity: Not Currently

## 2017-12-19 ENCOUNTER — Other Ambulatory Visit: Payer: Self-pay | Admitting: Cardiology

## 2017-12-20 ENCOUNTER — Telehealth: Payer: Self-pay | Admitting: Pharmacist

## 2017-12-20 NOTE — Telephone Encounter (Signed)
AMGEN SfetyNey approve until 10/03/2018.   Patient to call and place 1st order ASAP.

## 2018-01-06 ENCOUNTER — Encounter (INDEPENDENT_AMBULATORY_CARE_PROVIDER_SITE_OTHER): Payer: Self-pay | Admitting: Orthopaedic Surgery

## 2018-01-06 ENCOUNTER — Ambulatory Visit (INDEPENDENT_AMBULATORY_CARE_PROVIDER_SITE_OTHER): Payer: Medicare Other | Admitting: Orthopaedic Surgery

## 2018-01-06 VITALS — BP 154/81 | HR 70 | Resp 16 | Ht 69.0 in | Wt 218.0 lb

## 2018-01-06 DIAGNOSIS — I25119 Atherosclerotic heart disease of native coronary artery with unspecified angina pectoris: Secondary | ICD-10-CM | POA: Diagnosis not present

## 2018-01-06 DIAGNOSIS — M79672 Pain in left foot: Secondary | ICD-10-CM | POA: Diagnosis not present

## 2018-01-06 NOTE — Progress Notes (Signed)
Office Visit Note   Patient: [name]           Date of Birth: Mar 11, 1936           MRN: [id number] Visit Date: 01/06/2018              Requested by: Denita Lung, MD [place], [place] [id number] PCP: Denita Lung, MD   Assessment & Plan: Visit Diagnoses:  1. Left foot pain     Plan: [name] is accompanied by his wife and here for follow-up evaluation of stress fracture of the left distal fibula.  He has been wearing the equalizer boot and notes that he has been comfortable.  On occasion he is taken the boot "off" and finds it is not having much pain.  No significant discomfort over the lateral malleolus today.  Will encourage him to remove the boot and only applied if he has pain.  Hopefully this is resolved without any further problems.  See back as necessary  Follow-Up Instructions: Return if symptoms worsen or fail to improve.   Orders:  No orders of the defined types were placed in this encounter.  No orders of the defined types were placed in this encounter.     Procedures: No procedures performed   Clinical Data: No additional findings.   Subjective: Chief Complaint  Patient presents with  . Follow-up    L ANKLE DOING GOOD BEEN WEARING CAM WALKER BOOT 3 WEEKS AND IS NOW HAVING PAIN IN RIGH 2ND & 3RD TOE STARTED THIS MORNING  Has been wearing equalizer boot for 3 weeks and comfortable.  Is been wearing a lift in his right shoe to make up for the difference developed a little bit of pain over the second and third metatarsal head of the right foot.  Presently quite comfortable in the left ankle and is even tried walking without the boot without any discomfort  HPI  Review of Systems  Constitutional: Negative for fatigue.  HENT: Negative for ear pain.   Eyes: Negative for pain.  Respiratory: Positive for shortness of breath. Negative for cough.   Cardiovascular: Positive for leg swelling.  Gastrointestinal: Negative for  constipation and diarrhea.  Genitourinary: Negative for difficulty urinating.  Musculoskeletal: Positive for back pain and neck pain.  Skin: Negative for rash and wound.  Allergic/Immunologic: Negative for food allergies.  Neurological: Negative for dizziness, weakness, numbness and headaches.  Hematological: Bruises/bleeds easily.  Psychiatric/Behavioral: Negative for sleep disturbance.     Objective: Vital Signs: BP (!) 154/81 (BP Location: Left Arm, Patient Position: Sitting, Cuff Size: Normal)   Pulse 70   Resp 16   Ht 5\' 9"  (1.753 m)   Wt 218 lb (98.9 kg)   BMI 32.19 kg/m   Physical Exam  Ortho Exam awake alert and oriented x3.  Comfortable sitting.  Evaluated the right foot and he has little bit of tenderness over the third metatarsal nodule joint and distal metatarsal.  No skin changes.  No swelling.  No redness.  Could be an early stress fracture.  No pain over the lateral malleolus left ankle where he was suspected to have a stress fracture.  Specialty Comments:  No specialty comments available.  Imaging: No results found.   PMFS History: Patient Active Problem List   Diagnosis Date Noted  . Senile purpura ([place]) 07/20/2017  . S/P total knee replacement 12/20/2016  . Endoleak post (EVAR) endovascular aneurysm repair ([place]) 03/16/2016  . Moderate aortic stenosis by  prior echocardiogram 02/02/2016  . Statin intolerance 09/30/2015  . Calculus of bile duct with obstruction and without cholangitis or cholecystitis   . Obstruction of biliary stent   . History of unstable angina 01/08/2015  . Presence of drug coated stent in right coronary artery and circumflex coronary artery 01/08/2015  . Chronic renal insufficiency, stage III (moderate) ([place]) 06/06/2014  . Coronary stent restenosis with uncertain cause: Required PCI for ISR of OM1 (Promus P 2.75 mm x 12 mm) & pRCA 05/17/2014  . Obesity (BMI 30-39.9) 06/28/2013  . Encounter for long-term (current) use of medications  06/28/2013  . ACE-inhibitor cough 04/16/2013  . Chronic diastolic CHF (congestive heart failure) ([place]) 11/24/2012  . Type II diabetes mellitus with complication -- CAD, AAA 03/04/2011    Class: Diagnosis of  . Hypertensive heart disease with chronic diastolic congestive heart failure ([place]) 03/04/2011  . Hyperlipidemia LDL goal <70; statin intolerant 03/04/2011    Class: Diagnosis of  . ED (erectile dysfunction) of organic origin 03/04/2011  . GERD (gastroesophageal reflux disease) 03/04/2011  . S/P CABG x 2: 1997. SVG-OM (known to be occluded), LIMA-LAD 06/28/1996  . History of ST elevation myocardial infarction (STEMI) of inferolateral wall (PTCA - 100% large lateral OM) 06/28/1996    Class: History of  . CAD S/P CABG '95- several PCis since, last 05/17/14 06/28/1994    Class: History of   Past Medical History:  Diagnosis Date  . Adenomatous colon polyp   . Ankle edema    Chronic  . Asthma    as a child  . CAD S/P percutaneous coronary angioplasty 06/2003, 12/2005, 05/2014   a) '04: Staged Taxus DES PCI to RCA (2 Taxus 2.75 x 32 & 12) and Cx-OM (Taxus 3 x 20);;'07 - PCI to pRCA ISR- Cypher DES; c) 05/2014: pPCI pRCA stentISR (Promus DES 3 x 8) OM1 distal to stent (Promus DES 2.75 x 12 - 3.1)  . CAD, multiple vessel 1985   Most recent July 2018: Admitted for non-STEMI. Occluded LAD with patent LIMA (SP1 now occluded).  Patent stents in proximal and mid RCA as well as circumflex-OM 3. Known occlusion of SVG-OM. EF 45-50%  . Cholelithiasis - with cholangitis & choledocholithiasis    status post ERCP with removal of calculi and biliary stent placement.  . Chronic anemia    On iron supplement; history of positive guaiac - negative colonoscopy in 1996.; Thought to be related to hemorrhoids; status post hemorrhoidectomy  . Chronic back pain     multiple surgeries; C-spine and lumbar  . Diabetes mellitus    on oral medication  . Diverticulitis of colon 1996  . Diverticulosis   .  Dyslipidemia, goal LDL below 70   . Erectile dysfunction   . Exertional dyspnea    Chronic baseline SOB with ambulation  . GERD (gastroesophageal reflux disease)   . H/O: pneumonia February 14  . Hemorrhoids   . Hiatal hernia   . History of: ST elevation myocardial infarction (STEMI) involving left circumflex coronary artery with complication 2703   PTCA-circumflex; PCI in 1991  . Hypertension   . Moderate aortic stenosis by prior echocardiogram 07/2015   Normal LV function - EF 55-60%.. Abnormal relaxation. Mild-moderate aortic stenosis (peak/mean gradient 18/10 mmH)  . Osteoarthritis of both knees    And back; multiple back surgeries, right knee arthroplasty and left knee arthroscopic surgery x2  . Pneumonia 2016  . S/P AAA (abdominal aortic aneurysm) repair 08/30/2012   s/p EVAR  . S/P CABG  x 2 1997   LIMA-LAD, SVG-OM  . Sleep apnea    pt. states he was told to return for f/u, to be fitted for Cpap, but pt. reports that he didn't follow up-no cpap used    Family History  Problem Relation Age of Onset  . Arthritis Mother   . Diabetes Father   . Heart disease Father   . Stroke Sister   . Hypertension Sister   . Heart disease Sister   . Diabetes Sister   . Breast cancer Sister   . Heart disease Brother   . Ulcers Brother   . Colon cancer Neg Hx     Past Surgical History:  Procedure Laterality Date  . Abdominal and Lower Extremity Arterial Ultrasound  08/23/2012; 10/10/2013   Normal ABIs. Nonocclusive lower extremity disease. 4.2 cm x 4.3 cm infrarenal AAA;; 4.4 cm x 4.3 cm (essentially stable)   . ABDOMINAL AORTIC ENDOVASCULAR STENT GRAFT N/A 08/13/2015   Procedure: ABDOMINAL AORTIC ENDOVASCULAR STENT GRAFT;  Surgeon: Mal Misty, MD;  Location: [facility];  Service: Vascular;  Laterality: N/A;  . Anterior cervical plating  04/23/10   At C4-5 and a C6-7 utilizing two separate Biomet MaxAn plates.  . ANTERIOR LAT LUMBAR FUSION Left 11/22/2012   Procedure: ANTERIOR LATERAL  LUMBAR FUSION 1 LEVEL;  Surgeon: Eustace Moore, MD;  Location: [facility];  Service: Neurosurgery;  Laterality: Left;  Anterior Lateral Lumbar Fusion Lumbar Three-Four  . BACK SURGERY  1979 & x 10   pt. remarks, "I have had about 10 back surgeries"  . BILIARY STENT PLACEMENT N/A 07/03/2014   Procedure: BILIARY STENT PLACEMENT;  Surgeon: Inda Castle, MD;  Location: [facility];  Service: Endoscopy;  Laterality: N/A;  . CARDIAC CATHETERIZATION  September 2004   None Occluded vein graft to OM; diffuse RCA disease in the mid vessel, 80% circumflex-OM stenosis; follow on AV groove circumflex with sequential 90% stenoses and intervening saccular dilation   . CARDIAC CATHETERIZATION  March 2010   4 abnormal Myoview showing apical thinning (possibly due to apical LAD 95%) : 100% Occluded LAD after SV1, distal LAD grafted via LIMA -apical 95% . Cx -OM1 w/patent stent extending into OM 1 . Follow on Cx - 70-80% - non-amenable PCI. RCA widely patent 3 overlapping stents in mRCA w/less than 40% stenosisin RPL; SVG-OM known occluded   . CATARACT EXTRACTION    . Cervical arthrodesis  04/23/10   Anterior cervical arthrodesis, C4-5, C6-7 utilizing 7-mm PEEK interbody cage packed with local autograft & Antifuse putty at C4-5 & an 8-mm cage at C6-7.  Marland Kitchen CERVICAL DISCECTOMY  04/23/10   Decompressive anterior carvical diskectomy. C4-5, C6-7  . CHOLECYSTECTOMY N/A 05/23/2015   Procedure: LAPAROSCOPIC CHOLECYSTECTOMY WITH INTRAOPERATIVE CHOLANGIOGRAM;  Surgeon: Alphonsa Overall, MD;  Location: [facility];  Service: General;  Laterality: N/A;  . [place]  . CORONARY ANGIOPLASTY  1985,1991,15   1985 lateral STEMI Circumflex PTCA;   . CORONARY ARTERY BYPASS GRAFT  1997   LIMA-LAD, SVG-OM (SVG known to be occluded prior to 2004)  . ERCP N/A 07/03/2014   Procedure: ENDOSCOPIC RETROGRADE CHOLANGIOPANCREATOGRAPHY (ERCP);  Surgeon: Inda Castle, MD;  Location: [facility];  Service: Endoscopy;  Laterality: N/A;  .  ERCP N/A 07/05/2014   Procedure: ENDOSCOPIC RETROGRADE CHOLANGIOPANCREATOGRAPHY (ERCP);  Surgeon: Inda Castle, MD;  Location: [facility];  Service: Endoscopy;  Laterality: N/A;  . ERCP N/A 06/30/2015   Procedure: ENDOSCOPIC RETROGRADE CHOLANGIOPANCREATOGRAPHY (ERCP);  Surgeon: Ladene Artist,  MD;  Location: [facility];  Service: Endoscopy;  Laterality: N/A;  . EYE SURGERY    . IR ANGIOGRAM EXTREMITY LEFT  01/18/2017  . IR ANGIOGRAM PELVIS SELECTIVE OR SUPRASELECTIVE  01/18/2017  . IR ANGIOGRAM SELECTIVE EACH ADDITIONAL VESSEL  01/18/2017  . IR AORTAGRAM ABDOMINAL SERIALOGRAM  01/18/2017  . IR EMBO ARTERIAL NOT HEMORR HEMANG INC GUIDE ROADMAPPING  01/18/2017  . IR RADIOLOGIST EVAL & MGMT  01/04/2017  . IR RADIOLOGIST EVAL & MGMT  10/26/2017  . IR US GUIDE VASC ACCESS RIGHT  01/18/2017  . KNEE ARTHROSCOPY Left    x 2  . LEFT HEART CATH AND CORS/GRAFTS ANGIOGRAPHY N/A 04/20/2017   Procedure: Left Heart Cath and Cors/Grafts Angiography;  Surgeon: Lorretta Harp, MD;  Location: [facility]ASIVE CV LAB;  LAD now occluded prior to SP1.LIMA-LAD. Patent RCA and circumflex stents. EF 45-50%. Relatively stable.   Marland Kitchen LEFT HEART CATHETERIZATION WITH CORONARY ANGIOGRAM N/A 05/15/2014   Procedure: LEFT HEART CATHETERIZATION WITH CORONARY ANGIOGRAM;  Surgeon: Sinclair Grooms, MD;  Location: [facility] CATH LAB;  Service: Cardiovascular;  Laterality: N/A;  . LEFT HEART CATHETERIZATION WITH CORONARY/GRAFT ANGIOGRAM N/A 06/28/2014   Procedure: LEFT HEART CATHETERIZATION WITH Beatrix Fetters;  Surgeon: Troy Sine, MD;  Location: [facility] LAB;  Service: Cardiovascular;  Laterality: N/A;  . LUMBAR PERCUTANEOUS PEDICLE SCREW 1 LEVEL N/A 11/22/2012   Procedure: LUMBAR PERCUTANEOUS PEDICLE SCREW 1 LEVEL;  Surgeon: Eustace Moore, MD;  Location: [place] [facility];  Service: Neurosurgery;  Laterality: N/A;  Lumbar Three-Four Percutaneous Pedicle Screw, Lateral approach  . MINOR HEMORRHOIDECTOMY    . NM MYOVIEW LTD  December  2013   LOW RISK. Mmoderate region of mid to basal inferolateral scar without ischemia. Mild apical hypokinesis with an EF of 47%.  Marland Kitchen PERCUTANEOUS CORONARY STENT INTERVENTION (PCI-S)  September 2004   PCI - RCA 2 overlapping Taxus DES 2.75 mm x 32 mm and 2.75 mm x 12 mm (3.0 mm); PCI-Cx-OM1 - Taxus DES 3.0 mm x 20 mm (3.1 mm);   Marland Kitchen PERCUTANEOUS CORONARY STENT INTERVENTION (PCI-S)  12/2005   80% ISR in proximal Taxus stent in RCA -- covered proximally with Cypher DES 3.0 mm x 12 mm  . PERCUTANEOUS CORONARY STENT INTERVENTION (PCI-S) N/A 05/17/2014   Procedure: PERCUTANEOUS CORONARY STENT INTERVENTION (PCI-S);  Surgeon: Sinclair Grooms, MD;  Location: [facility] CATH LAB: PCI pRCA stent ISR - Promus DES 3.0 x 8 (3.25), OM distal stent edge - Promus DES 2.75 x 12 (3.1)  . PERIPHERAL VASCULAR CATHETERIZATION  11/03/2016   Procedure: Embolization;  Surgeon: Waynetta Sandy, MD;  Location: [place] CV LAB;  Service: Cardiovascular;;  . POSTERIOR CERVICAL FUSION/FORAMINOTOMY N/A 05/02/2013   Procedure: POSTERIOR CERVICAL FUSION/FORAMINOTOMY CERVICAL SEVEN THORACIC-ONE;  Surgeon: Eustace Moore, MD;  Location: [place] [facility];  Service: Neurosurgery;  Laterality: N/A;  POSTERIOR CERVICAL FUSION/FORAMINOTOMY CERVICAL SEVEN THORACIC-ONE  . SPHINCTEROTOMY N/A 06/30/2015   Procedure: SPHINCTEROTOMY;  Surgeon: Ladene Artist, MD;  Location: [facility];  Service: Endoscopy;  Laterality: N/A;  . TOTAL KNEE ARTHROPLASTY Right   . TOTAL KNEE ARTHROPLASTY Left 12/20/2016   Procedure: LEFT TOTAL KNEE ARTHROPLASTY;  Surgeon: Vickey Huger, MD;  Location: [facility];  Service: Orthopedics;  Laterality: Left;  . TRANSTHORACIC ECHOCARDIOGRAM  December 2013; October 2016   a. EF 55-60%. mod LA dilation. Aortic Sclerosis;; b. EF 55-60%, Gr 1 DD, Mild-Mod AS (Peark - Mean Gradient 18-10 mmHg)  . TRANSTHORACIC ECHOCARDIOGRAM  07/2016   EF  45-50%. Inferolateral hypokinesis (correlates with scar noted on Myoview). GR 1 DD. Mild  aortic stenosis. Mod LAE.  Marland Kitchen VISCERAL ANGIOGRAM  11/03/2016   Procedure: Visceral Angiogram;  Surgeon: Waynetta Sandy, MD;  Location: [facility] CV LAB;  Service: Cardiovascular;;   Social History   Occupational History  . Not on file  Tobacco Use  . Smoking status: Former Smoker    Types: Cigarettes    Last attempt to quit: 10/05/1983    Years since quitting: 34.2  . Smokeless tobacco: Never Used  Substance and Sexual Activity  . Alcohol use: Yes    Alcohol/week: 0.6 oz    Types: 1 Glasses of wine per week    Comment:  4 oz. wine daily  . Drug use: No  . Sexual activity: Not Currently

## 2018-01-17 ENCOUNTER — Other Ambulatory Visit: Payer: Self-pay | Admitting: Family Medicine

## 2018-01-17 DIAGNOSIS — E118 Type 2 diabetes mellitus with unspecified complications: Secondary | ICD-10-CM

## 2018-02-13 ENCOUNTER — Ambulatory Visit (INDEPENDENT_AMBULATORY_CARE_PROVIDER_SITE_OTHER): Payer: Medicare Other | Admitting: Medical

## 2018-02-13 ENCOUNTER — Encounter: Payer: Self-pay | Admitting: Medical

## 2018-02-13 VITALS — BP 154/70 | HR 88 | Temp 98.1°F | Ht 70.0 in | Wt 218.6 lb

## 2018-02-13 DIAGNOSIS — I25119 Atherosclerotic heart disease of native coronary artery with unspecified angina pectoris: Secondary | ICD-10-CM | POA: Diagnosis not present

## 2018-02-13 DIAGNOSIS — R0609 Other forms of dyspnea: Secondary | ICD-10-CM

## 2018-02-13 DIAGNOSIS — R05 Cough: Secondary | ICD-10-CM

## 2018-02-13 DIAGNOSIS — R0602 Shortness of breath: Secondary | ICD-10-CM

## 2018-02-13 DIAGNOSIS — R059 Cough, unspecified: Secondary | ICD-10-CM

## 2018-02-13 MED ORDER — ALBUTEROL SULFATE HFA 108 (90 BASE) MCG/ACT IN AERS: 2.0000 | INHALATION_SPRAY | Freq: Four times a day (QID) | RESPIRATORY_TRACT | 0 refills | Status: DC | PRN

## 2018-02-13 MED ORDER — AMOXICILLIN 875 MG PO TABS: 875.0000 mg | ORAL_TABLET | Freq: Two times a day (BID) | ORAL | 0 refills | Status: DC

## 2018-02-13 MED ORDER — BUDESONIDE-FORMOTEROL FUMARATE 80-4.5 MCG/ACT IN AERO: 2.0000 | INHALATION_SPRAY | Freq: Two times a day (BID) | RESPIRATORY_TRACT | 12 refills | Status: DC

## 2018-02-13 MED ORDER — ALBUTEROL SULFATE (2.5 MG/3ML) 0.083% IN NEBU
2.5000 mg | INHALATION_SOLUTION | Freq: Once | RESPIRATORY_TRACT | Status: AC
  Administered 2018-02-13: 2.5 mg via RESPIRATORY_TRACT

## 2018-02-13 NOTE — Addendum Note (Signed)
Addended by: Minette Headland A on: 02/13/2018 03:32 PM   Modules accepted: Orders

## 2018-02-13 NOTE — Patient Instructions (Addendum)
Recommendations:  Begin Symbicort "maintenance" inhaler 2 puffs twice daily until you run out of this.  Continue Albuterol rescue inhaler 2 puffs every 4-6 hours as needed for wheezing or shortness of breath  Begin 10 day course of Amoxicillin antibiotic  Hydrate well with water.  Rest  You can use over the counter Mucinex DM for cough and congestion  Plan to recheck here with lung breathing test called PFT in 2-3 weeks.

## 2018-02-13 NOTE — Progress Notes (Signed)
Subjective: Chief Complaint  Patient presents with  . Cough    x 1 week    Here for cough x 1+ week, but lately worse productive cough.   No fever, no NVD.  Has felt some wheezing.   No head pressure.  Has had some sore throat.   No sick contacts.  Using cough syrup left over from prior visit and mucinex DM.   Has diabetes.  Sugars usually run in low 100s. 101 this morning.  Has hx/o asthma.  Used albuterol yesterday with no relief (may be old inhaler).  Has heart disease, last saw cardiology 10/2017.  No other aggravating or relieving factors. No other complaint.  Past Medical History:  Diagnosis Date  . Adenomatous colon polyp   . Ankle edema    Chronic  . Asthma   . CAD S/P percutaneous coronary angioplasty 06/2003, 12/2005, 05/2014   a) '04: Staged Taxus DES PCI to RCA (2 Taxus 2.75 x 32 & 12) and Cx-OM (Taxus 3 x 20);;'07 - PCI to pRCA ISR- Cypher DES; c) 05/2014: pPCI pRCA stentISR (Promus DES 3 x 8) OM1 distal to stent (Promus DES 2.75 x 12 - 3.1)  . CAD, multiple vessel 1985   Most recent July 2018: Admitted for non-STEMI. Occluded LAD with patent LIMA (SP1 now occluded).  Patent stents in proximal and mid RCA as well as circumflex-OM 3. Known occlusion of SVG-OM. EF 45-50%  . Cholelithiasis - with cholangitis & choledocholithiasis    status post ERCP with removal of calculi and biliary stent placement.  . Chronic anemia    On iron supplement; history of positive guaiac - negative colonoscopy in 1996.; Thought to be related to hemorrhoids; status post hemorrhoidectomy  . Chronic back pain     multiple surgeries; C-spine and lumbar  . Diabetes mellitus    on oral medication  . Diverticulitis of colon 1996  . Diverticulosis   . Dyslipidemia, goal LDL below 70   . Erectile dysfunction   . Exertional dyspnea    Chronic baseline SOB with ambulation  . GERD (gastroesophageal reflux disease)   . H/O: pneumonia February 14  . Hemorrhoids   . Hiatal hernia   . History of: ST elevation  myocardial infarction (STEMI) involving left circumflex coronary artery with complication 2202   PTCA-circumflex; PCI in 1991  . Hypertension   . Moderate aortic stenosis by prior echocardiogram 07/2015   Normal LV function - EF 55-60%.. Abnormal relaxation. Mild-moderate aortic stenosis (peak/mean gradient 18/10 mmH)  . Osteoarthritis of both knees    And back; multiple back surgeries, right knee arthroplasty and left knee arthroscopic surgery x2  . Pneumonia 2016  . S/P AAA (abdominal aortic aneurysm) repair 08/30/2012   s/p EVAR  . S/P CABG x 2 1997   LIMA-LAD, SVG-OM  . Sleep apnea    pt. states he was told to return for f/u, to be fitted for Cpap, but pt. reports that he didn't follow up-no cpap used   Current Outpatient Medications on File Prior to Visit  Medication Sig Dispense Refill  . acetaminophen (TYLENOL) 500 MG tablet Take 1,000 mg by mouth every 6 (six) hours as needed for mild pain, moderate pain or headache. Reported on 03/16/2016    . amLODipine (NORVASC) 5 MG tablet TAKE 5 MG BY MOUTH DAILY....need appointment before further refills (Patient taking differently: Take 5 mg by mouth daily. ) 90 tablet 0  . aspirin EC 81 MG EC tablet Take 1 tablet (81 mg total)  by mouth daily. 30 tablet 11  . Choline Fenofibrate (FENOFIBRIC ACID) 135 MG CPDR TAKE 1 CAPSULE BY MOUTH ONCE DAILY 90 capsule 3  . clopidogrel (PLAVIX) 75 MG tablet TAKE ONE TABLET BY MOUTH ONCE DAILY (Patient taking differently: TAKE 75mg  BY MOUTH ONCE DAILY) 90 tablet 3  . Evolocumab (REPATHA) 140 MG/ML SOSY Inject into the skin.    Marland Kitchen ezetimibe (ZETIA) 10 MG tablet TAKE 1 TABLET BY MOUTH ONCE DAILY 90 tablet 3  . ferrous sulfate 325 (65 FE) MG tablet Take 325 mg by mouth daily with breakfast.    . glucose blood (ONETOUCH VERIO) test strip USE ONE STRIP TO CHECK GLUCOSE TWICE DAILY E11.9 180 each 2  . losartan (COZAAR) 100 MG tablet TAKE 1 TABLET BY MOUTH ONCE DAILY 90 tablet 0  . metFORMIN (GLUCOPHAGE) 500 MG  tablet TAKE 1 TABLET BY MOUTH ONCE DAILY WITH BREAKFAST 90 tablet 1  . Multiple Vitamins-Minerals (MULTIVITAMIN WITH MINERALS) tablet Take 1 tablet by mouth daily.     . nitroGLYCERIN (NITROSTAT) 0.4 MG SL tablet Place 1 tablet (0.4 mg total) under the tongue every 5 (five) minutes x 3 doses as needed for chest pain. 25 tablet 12  . pantoprazole (PROTONIX) 40 MG tablet TAKE ONE TABLET BY MOUTH ONCE DAILY (Patient taking differently: TAKE 40mg  BY MOUTH ONCE DAILY) 90 tablet 3  . promethazine-dextromethorphan (PROMETHAZINE-DM) 6.25-15 MG/5ML syrup Take 5 mLs by mouth 4 (four) times daily as needed for cough. 120 mL 0  . ranolazine (RANEXA) 500 MG 12 hr tablet Take 1 tablet (500 mg total) by mouth 2 (two) times daily. 60 tablet 8  . sildenafil (REVATIO) 20 MG tablet TAKE 2-5 TABLETS BY MOUTH AS NEEDED FOR SEXUAL ACTIVITY 50 tablet 5  . rosuvastatin (CRESTOR) 20 MG tablet Take 1 tablet (20 mg total) by mouth daily. (Patient not taking: Reported on 01/06/2018) 90 tablet 3   No current facility-administered medications on file prior to visit.    ROS as in subjective    Objective: BP (!) 154/70   Pulse 88   Temp 98.1 F (36.7 C) (Oral)   Ht 5\' 10"  (1.778 m)   Wt 218 lb 9.6 oz (99.2 kg)   SpO2 96%   BMI 31.37 kg/m   BP Readings from Last 3 Encounters:  02/13/18 (!) 154/70  01/06/18 (!) 154/81  12/16/17 (!) 159/84   Wt Readings from Last 3 Encounters:  02/13/18 218 lb 9.6 oz (99.2 kg)  01/06/18 218 lb (98.9 kg)  12/16/17 219 lb (99.3 kg)   General appearance: alert, no distress, WD/WN HEENT: normocephalic, sclerae anicteric, TMs pearly, nares patent, no discharge, mild erythema, pharynx with slight erythema Oral cavity: MMM, no lesions Neck: supple, no lymphadenopathy, no thyromegaly, no masses, no JVD Heart: RRR, normal S1, S2, no murmurs Lungs: bronchial throughout, faint wheezes,+ rhonchi, - rales Ext: no edema Pulses: 1+ symmetric, upper and lower extremities, normal cap  refill     Assessment: Encounter Diagnoses  Name Primary?  . Cough Yes  . SOB (shortness of breath)      Plan: Discussed symptoms, exam findings.    Gave 1 round of albuterol nebulized treatment in office.  He noted improvement  Patient Instructions  Recommendations:  Begin Symbicort "maintenance" inhaler 2 puffs twice daily until you run out of this.  Continue Albuterol rescue inhaler 2 puffs every 4-6 hours as needed for wheezing or shortness of breath  Begin 10 day course of Amoxicillin antibiotic  Hydrate well with water.  Rest  You can use over the counter Mucinex DM for cough and congestion  Plan to recheck here with lung breathing test called PFT in 2-3 weeks.     [name] was seen today for cough.  Diagnoses and all orders for this visit:  Cough  SOB (shortness of breath)  Other orders -     albuterol (PROVENTIL HFA;VENTOLIN HFA) 108 (90 Base) MCG/ACT inhaler; Inhale 2 puffs into the lungs every 6 (six) hours as needed for wheezing or shortness of breath. -     budesonide-formoterol (SYMBICORT) 80-4.5 MCG/ACT inhaler; Inhale 2 puffs into the lungs 2 (two) times daily. -     amoxicillin (AMOXIL) 875 MG tablet; Take 1 tablet (875 mg total) by mouth 2 (two) times daily.

## 2018-02-14 ENCOUNTER — Other Ambulatory Visit: Payer: Self-pay | Admitting: Medical

## 2018-02-15 ENCOUNTER — Other Ambulatory Visit: Payer: Self-pay | Admitting: Medical

## 2018-02-15 ENCOUNTER — Telehealth: Payer: Self-pay

## 2018-02-15 MED ORDER — PROMETHAZINE-DM 6.25-15 MG/5ML PO SYRP: 5.0000 mL | ORAL_SOLUTION | Freq: Four times a day (QID) | ORAL | 0 refills | Status: DC | PRN

## 2018-02-15 NOTE — Telephone Encounter (Signed)
Patient's wife called wanting refill on cough RX, patient has no improvement since his visit on Monday.   Per Dorothea Ogle RX sent in to [place].  Patient's wife notified.

## 2018-02-16 ENCOUNTER — Other Ambulatory Visit: Payer: Self-pay | Admitting: Medical

## 2018-02-16 ENCOUNTER — Telehealth: Payer: Self-pay

## 2018-02-16 MED ORDER — BENZONATATE 200 MG PO CAPS: 200.0000 mg | ORAL_CAPSULE | Freq: Three times a day (TID) | ORAL | 0 refills | Status: DC | PRN

## 2018-02-16 NOTE — Telephone Encounter (Signed)
Received fax from [facility] for clarification on Promethazine DM Syp is on backorder would like to see if Dr will change to something else.   Please advise.   Thank you

## 2018-02-16 NOTE — Telephone Encounter (Signed)
I sent [employer] as an alternate

## 2018-02-20 ENCOUNTER — Ambulatory Visit
Admission: RE | Admit: 2018-02-20 | Discharge: 2018-02-20 | Disposition: A | Discharge status: Home / Self Care | Payer: Medicare Other | Source: Ambulatory Visit | Attending: Medical | Admitting: Medical

## 2018-02-20 ENCOUNTER — Telehealth: Payer: Self-pay | Admitting: Family Medicine

## 2018-02-20 ENCOUNTER — Other Ambulatory Visit: Payer: Self-pay | Admitting: Cardiology

## 2018-02-20 ENCOUNTER — Other Ambulatory Visit: Payer: Self-pay | Admitting: Medical

## 2018-02-20 ENCOUNTER — Telehealth: Payer: Self-pay | Admitting: Pharmacist

## 2018-02-20 DIAGNOSIS — R05 Cough: Secondary | ICD-10-CM | POA: Diagnosis not present

## 2018-02-20 DIAGNOSIS — R059 Cough, unspecified: Secondary | ICD-10-CM

## 2018-02-20 NOTE — Telephone Encounter (Signed)
Repatha 140mg  [place] every 14 days  Received Reptha April 1st, April 15th, and April 29th. Dose due 5/13 was held due to itching and respiratory infection.   *Patient instructed to HOLD Repatha for now, and call back if 2 weeks to discuss additional therapy alternatives*  Will consider trial with Praluent 75mg  is pokay with patient.

## 2018-02-20 NOTE — Telephone Encounter (Signed)
REFILL 

## 2018-02-20 NOTE — Telephone Encounter (Signed)
Pt wife called and states that he is still not felling good he is still coughing and coughing up some greenish white mucous she is wanting him to go get a chest xray to see what is going on, they can be reached at [phone], he is still taking the antibiotics and the mucinex

## 2018-02-20 NOTE — Telephone Encounter (Signed)
I agree, have him go for chest xray.  I put order in system

## 2018-02-20 NOTE — Telephone Encounter (Signed)
Pt's wife has been advised to take the patient to have ct chest done.

## 2018-02-21 ENCOUNTER — Other Ambulatory Visit: Payer: Self-pay | Admitting: Medical

## 2018-02-21 MED ORDER — PREDNISONE 10 MG PO TABS: ORAL_TABLET | ORAL | 0 refills | Status: DC

## 2018-03-06 ENCOUNTER — Ambulatory Visit: Payer: Medicare Other | Admitting: Medical

## 2018-03-13 ENCOUNTER — Other Ambulatory Visit: Payer: Self-pay | Admitting: Cardiology

## 2018-03-29 DIAGNOSIS — H35033 Hypertensive retinopathy, bilateral: Secondary | ICD-10-CM | POA: Diagnosis not present

## 2018-03-29 DIAGNOSIS — H40013 Open angle with borderline findings, low risk, bilateral: Secondary | ICD-10-CM | POA: Diagnosis not present

## 2018-03-29 DIAGNOSIS — H353131 Nonexudative age-related macular degeneration, bilateral, early dry stage: Secondary | ICD-10-CM | POA: Diagnosis not present

## 2018-03-29 DIAGNOSIS — E119 Type 2 diabetes mellitus without complications: Secondary | ICD-10-CM | POA: Diagnosis not present

## 2018-03-29 LAB — HM DIABETES EYE EXAM

## 2018-04-20 ENCOUNTER — Other Ambulatory Visit (HOSPITAL_COMMUNITY): Payer: Self-pay | Admitting: Interventional Radiology

## 2018-04-20 DIAGNOSIS — I714 Abdominal aortic aneurysm, without rupture, unspecified: Secondary | ICD-10-CM

## 2018-04-20 DIAGNOSIS — T82330D Leakage of aortic (bifurcation) graft (replacement), subsequent encounter: Secondary | ICD-10-CM

## 2018-04-20 DIAGNOSIS — IMO0001 Reserved for inherently not codable concepts without codable children: Secondary | ICD-10-CM

## 2018-04-21 ENCOUNTER — Ambulatory Visit (HOSPITAL_COMMUNITY)
Admission: RE | Admit: 2018-04-21 | Discharge: 2018-04-21 | Disposition: A | Discharge status: Home / Self Care | Payer: Medicare Other | Source: Ambulatory Visit | Attending: Vascular Surgery | Admitting: Vascular Surgery

## 2018-04-21 ENCOUNTER — Encounter: Payer: Self-pay | Admitting: Family

## 2018-04-21 ENCOUNTER — Ambulatory Visit (INDEPENDENT_AMBULATORY_CARE_PROVIDER_SITE_OTHER): Payer: Medicare Other | Admitting: Family

## 2018-04-21 VITALS — BP 157/91 | HR 63 | Temp 97.3°F | Resp 16 | Ht 70.0 in | Wt 215.4 lb

## 2018-04-21 DIAGNOSIS — Z87891 Personal history of nicotine dependence: Secondary | ICD-10-CM | POA: Diagnosis not present

## 2018-04-21 DIAGNOSIS — IMO0001 Reserved for inherently not codable concepts without codable children: Secondary | ICD-10-CM

## 2018-04-21 DIAGNOSIS — T82330D Leakage of aortic (bifurcation) graft (replacement), subsequent encounter: Secondary | ICD-10-CM

## 2018-04-21 DIAGNOSIS — I714 Abdominal aortic aneurysm, without rupture, unspecified: Secondary | ICD-10-CM

## 2018-04-21 DIAGNOSIS — Z95828 Presence of other vascular implants and grafts: Secondary | ICD-10-CM | POA: Diagnosis not present

## 2018-04-21 DIAGNOSIS — R609 Edema, unspecified: Secondary | ICD-10-CM | POA: Diagnosis not present

## 2018-04-21 DIAGNOSIS — I25119 Atherosclerotic heart disease of native coronary artery with unspecified angina pectoris: Secondary | ICD-10-CM

## 2018-04-21 DIAGNOSIS — X58XXXD Exposure to other specified factors, subsequent encounter: Secondary | ICD-10-CM | POA: Diagnosis not present

## 2018-04-21 NOTE — Patient Instructions (Signed)
Before your next abdominal ultrasound:  Avoid gas forming foods and beverages the day before the test.   Take two Extra-Strength Gas-X capsules at bedtime the night before the test. Take another two Extra-Strength Gas-X capsules in the middle of the night if you get up to the restroom, if not, first thing in the morning with water.     To decrease swelling in your feet and legs: Elevate feet above slightly bent knees, feet above heart, overnight and 3-4 times per day for 20 minutes.     Edema Edema is when you have too much fluid in your body or under your skin. Edema may make your legs, feet, and ankles swell up. Swelling is also common in looser tissues, like around your eyes. This is a common condition. It gets more common as you get older. There are many possible causes of edema. Eating too much salt (sodium) and being on your feet or sitting for a long time can cause edema in your legs, feet, and ankles. Hot weather may make edema worse. Edema is usually painless. Your skin may look swollen or shiny. Follow these instructions at home:  Keep the swollen body part raised (elevated) above the level of your heart when you are sitting or lying down.  Do not sit still or stand for a long time.  Do not wear tight clothes. Do not wear garters on your upper legs.  Exercise your legs. This can help the swelling go down.  Wear elastic bandages or support stockings as told by your doctor.  Eat a low-salt (low-sodium) diet to reduce fluid as told by your doctor.  Depending on the cause of your swelling, you may need to limit how much fluid you drink (fluid restriction).  Take over-the-counter and prescription medicines only as told by your doctor. Contact a doctor if:  Treatment is not working.  You have heart, liver, or kidney disease and have symptoms of edema.  You have sudden and unexplained weight gain. Get help right away if:  You have shortness of breath or chest pain.  You  cannot breathe when you lie down.  You have pain, redness, or warmth in the swollen areas.  You have heart, liver, or kidney disease and get edema all of a sudden.  You have a fever and your symptoms get worse all of a sudden. Summary  Edema is when you have too much fluid in your body or under your skin.  Edema may make your legs, feet, and ankles swell up. Swelling is also common in looser tissues, like around your eyes.  Raise (elevate) the swollen body part above the level of your heart when you are sitting or lying down.  Follow your doctor's instructions about diet and how much fluid you can drink (fluid restriction). This information is not intended to replace advice given to you by your health care provider. Make sure you discuss any questions you have with your health care provider. Document Released: 03/08/2008 Document Revised: 10/08/2016 Document Reviewed: 10/08/2016 Elsevier Interactive Patient Education  2017 [employer].

## 2018-04-21 NOTE — Progress Notes (Signed)
[facility] [place]  CC: Follow up s/p Endovascular Repair of Abdominal Aortic Aneurysm    History of Present Illness  [name] is a 82 y.o. (04-23-1936) male who is s/p EVAR on 08-13-15 by Dr. Kellie Simmering, s/p embolization of a lumbar artery on 01-18-17 performed by Dr. Barbie Banner in interventional radiology.  In 2018 he has also undergone stabilization of an IMA on 11-03-16 by Dr. Donzetta Matters, and also had a heart cath in July 2018, and left knee TKA in March 2018. He overall is recovered well from all these procedures.  Dr. Donzetta Matters last evaluated pt on 03-25-17. At that time pt was s/p recent coil embolization of a type II endoleak by both Dr. Donzetta Matters,  followed by Dr. Barbie Banner with interventional radiology. He is also had a left knee surgery and a heart cath which he has recovered well from. He hds no new complaints related to that visit, no new back or abdominal pain, and was overall doing well. His aneurysm was stable at 5.6 cm back to June 2017. There was a small suggestion of persistent leak in which Dr. Donzetta Matters could not identify the source on CTA. Given the stable size of his aneurysm at that time, pt was to return in 6 months with repeat duplex. Dr. Donzetta Matters discussed the signs and symptoms of rupture which he knows well.   Pt denies any known family history of AAA.   He reports chronic low back pain, has had several lumbar spine surgeries. He denies any new back pain, denies abdominal pain.   He reports claudication type sx's in both calves after walking 2-3 blocks, relieved by rest. He denies any hx of stroke or TIA. He reports 5-6 MI's, had a CABG.   He states the swelling in his lower legs resolves by morning.   Pt states his blood pressure at home is 161-096 systolic, 04-54 diastolic    He states he has chronic shortness of breath and a chronic cough, states both are no worse than usual.   Diabetic: Yes, A1C was 4.9 on 07-20-17 Tobaccos use: former smoker, quit quit in 1985,  started about age 26 years    Past Medical History:  Diagnosis Date  . Adenomatous colon polyp   . Ankle edema    Chronic  . Asthma   . CAD S/P percutaneous coronary angioplasty 06/2003, 12/2005, 05/2014   a) '04: Staged Taxus DES PCI to RCA (2 Taxus 2.75 x 32 & 12) and Cx-OM (Taxus 3 x 20);;'07 - PCI to pRCA ISR- Cypher DES; c) 05/2014: pPCI pRCA stentISR (Promus DES 3 x 8) OM1 distal to stent (Promus DES 2.75 x 12 - 3.1)  . CAD, multiple vessel 1985   Most recent July 2018: Admitted for non-STEMI. Occluded LAD with patent LIMA (SP1 now occluded).  Patent stents in proximal and mid RCA as well as circumflex-OM 3. Known occlusion of SVG-OM. EF 45-50%  . Cholelithiasis - with cholangitis & choledocholithiasis    status post ERCP with removal of calculi and biliary stent placement.  . Chronic anemia    On iron supplement; history of positive guaiac - negative colonoscopy in 1996.; Thought to be related to hemorrhoids; status post hemorrhoidectomy  . Chronic back pain     multiple surgeries; C-spine and lumbar  . Diabetes mellitus    on oral medication  . Diverticulitis of colon 1996  . Diverticulosis   . Dyslipidemia, goal LDL below 70   . Erectile dysfunction   .  Exertional dyspnea    Chronic baseline SOB with ambulation  . GERD (gastroesophageal reflux disease)   . H/O: pneumonia February 14  . Hemorrhoids   . Hiatal hernia   . History of: ST elevation myocardial infarction (STEMI) involving left circumflex coronary artery with complication 3536   PTCA-circumflex; PCI in 1991  . Hypertension   . Moderate aortic stenosis by prior echocardiogram 07/2015   Normal LV function - EF 55-60%.. Abnormal relaxation. Mild-moderate aortic stenosis (peak/mean gradient 18/10 mmH)  . Osteoarthritis of both knees    And back; multiple back surgeries, right knee arthroplasty and left knee arthroscopic surgery x2  . Pneumonia 2016  . S/P AAA (abdominal aortic aneurysm) repair 08/30/2012   s/p EVAR   . S/P CABG x 2 1997   LIMA-LAD, SVG-OM  . Sleep apnea    pt. states he was told to return for f/u, to be fitted for Cpap, but pt. reports that he didn't follow up-no cpap used   Past Surgical History:  Procedure Laterality Date  . Abdominal and Lower Extremity Arterial Ultrasound  08/23/2012; 10/10/2013   Normal ABIs. Nonocclusive lower extremity disease. 4.2 cm x 4.3 cm infrarenal AAA;; 4.4 cm x 4.3 cm (essentially stable)   . ABDOMINAL AORTIC ENDOVASCULAR STENT GRAFT N/A 08/13/2015   Procedure: ABDOMINAL AORTIC ENDOVASCULAR STENT GRAFT;  Surgeon: Mal Misty, MD;  Location: [place];  Service: Vascular;  Laterality: N/A;  . Anterior cervical plating  04/23/10   At C4-5 and a C6-7 utilizing two separate Biomet MaxAn plates.  . ANTERIOR LAT LUMBAR FUSION Left 11/22/2012   Procedure: ANTERIOR LATERAL LUMBAR FUSION 1 LEVEL;  Surgeon: Eustace Moore, MD;  Location: [place] [facility];  Service: Neurosurgery;  Laterality: Left;  Anterior Lateral Lumbar Fusion Lumbar Three-Four  . BACK SURGERY  1979 & x 10   pt. remarks, "I have had about 10 back surgeries"  . BILIARY STENT PLACEMENT N/A 07/03/2014   Procedure: BILIARY STENT PLACEMENT;  Surgeon: Inda Castle, MD;  Location: [place];  Service: Endoscopy;  Laterality: N/A;  . CARDIAC CATHETERIZATION  September 2004   None Occluded vein graft to OM; diffuse RCA disease in the mid vessel, 80% circumflex-OM stenosis; follow on AV groove circumflex with sequential 90% stenoses and intervening saccular dilation   . CARDIAC CATHETERIZATION  March 2010   4 abnormal Myoview showing apical thinning (possibly due to apical LAD 95%) : 100% Occluded LAD after SV1, distal LAD grafted via LIMA -apical 95% . Cx -OM1 w/patent stent extending into OM 1 . Follow on Cx - 70-80% - non-amenable PCI. RCA widely patent 3 overlapping stents in mRCA w/less than 40% stenosisin RPL; SVG-OM known occluded   . CATARACT EXTRACTION    . Cervical arthrodesis  04/23/10   Anterior  cervical arthrodesis, C4-5, C6-7 utilizing 7-mm PEEK interbody cage packed with local autograft & Antifuse putty at C4-5 & an 8-mm cage at C6-7.  Marland Kitchen CERVICAL DISCECTOMY  04/23/10   Decompressive anterior carvical diskectomy. C4-5, C6-7  . CHOLECYSTECTOMY N/A 05/23/2015   Procedure: LAPAROSCOPIC CHOLECYSTECTOMY WITH INTRAOPERATIVE CHOLANGIOGRAM;  Surgeon: Alphonsa Overall, MD;  Location: [facility];  Service: General;  Laterality: N/A;  . [place]  . CORONARY ANGIOPLASTY  1985,1991,15   1985 lateral STEMI Circumflex PTCA;   . CORONARY ARTERY BYPASS GRAFT  1997   LIMA-LAD, SVG-OM (SVG known to be occluded prior to 2004)  . ERCP N/A 07/03/2014   Procedure: ENDOSCOPIC RETROGRADE CHOLANGIOPANCREATOGRAPHY (ERCP);  Surgeon: Inda Castle,  MD;  Location: [facility];  Service: Endoscopy;  Laterality: N/A;  . ERCP N/A 07/05/2014   Procedure: ENDOSCOPIC RETROGRADE CHOLANGIOPANCREATOGRAPHY (ERCP);  Surgeon: Inda Castle, MD;  Location: [facility];  Service: Endoscopy;  Laterality: N/A;  . ERCP N/A 06/30/2015   Procedure: ENDOSCOPIC RETROGRADE CHOLANGIOPANCREATOGRAPHY (ERCP);  Surgeon: Ladene Artist, MD;  Location: [facility];  Service: Endoscopy;  Laterality: N/A;  . EYE SURGERY    . IR ANGIOGRAM EXTREMITY LEFT  01/18/2017  . IR ANGIOGRAM PELVIS SELECTIVE OR SUPRASELECTIVE  01/18/2017  . IR ANGIOGRAM SELECTIVE EACH ADDITIONAL VESSEL  01/18/2017  . IR AORTAGRAM ABDOMINAL SERIALOGRAM  01/18/2017  . IR EMBO ARTERIAL NOT HEMORR HEMANG INC GUIDE ROADMAPPING  01/18/2017  . IR RADIOLOGIST EVAL & MGMT  01/04/2017  . IR RADIOLOGIST EVAL & MGMT  10/26/2017  . IR US GUIDE VASC ACCESS RIGHT  01/18/2017  . KNEE ARTHROSCOPY Left    x 2  . LEFT HEART CATH AND CORS/GRAFTS ANGIOGRAPHY N/A 04/20/2017   Procedure: Left Heart Cath and Cors/Grafts Angiography;  Surgeon: Lorretta Harp, MD;  Location: [facility] INVASIVE CV LAB;  LAD now occluded prior to SP1.LIMA-LAD. Patent RCA and circumflex stents. EF 45-50%. Relatively  stable.   Marland Kitchen LEFT HEART CATHETERIZATION WITH CORONARY ANGIOGRAM N/A 05/15/2014   Procedure: LEFT HEART CATHETERIZATION WITH CORONARY ANGIOGRAM;  Surgeon: Sinclair Grooms, MD;  Location: [facility] CATH LAB;  Service: Cardiovascular;  Laterality: N/A;  . LEFT HEART CATHETERIZATION WITH CORONARY/GRAFT ANGIOGRAM N/A 06/28/2014   Procedure: LEFT HEART CATHETERIZATION WITH Beatrix Fetters;  Surgeon: Troy Sine, MD;  Location: [facility] CATH LAB;  Service: Cardiovascular;  Laterality: N/A;  . LUMBAR PERCUTANEOUS PEDICLE SCREW 1 LEVEL N/A 11/22/2012   Procedure: LUMBAR PERCUTANEOUS PEDICLE SCREW 1 LEVEL;  Surgeon: Eustace Moore, MD;  Location: [place] [facility];  Service: Neurosurgery;  Laterality: N/A;  Lumbar Three-Four Percutaneous Pedicle Screw, Lateral approach  . MINOR HEMORRHOIDECTOMY    . NM MYOVIEW LTD  December 2013   LOW RISK. Mmoderate region of mid to basal inferolateral scar without ischemia. Mild apical hypokinesis with an EF of 47%.  Marland Kitchen PERCUTANEOUS CORONARY STENT INTERVENTION (PCI-S)  September 2004   PCI - RCA 2 overlapping Taxus DES 2.75 mm x 32 mm and 2.75 mm x 12 mm (3.0 mm); PCI-Cx-OM1 - Taxus DES 3.0 mm x 20 mm (3.1 mm);   Marland Kitchen PERCUTANEOUS CORONARY STENT INTERVENTION (PCI-S)  12/2005   80% ISR in proximal Taxus stent in RCA -- covered proximally with Cypher DES 3.0 mm x 12 mm  . PERCUTANEOUS CORONARY STENT INTERVENTION (PCI-S) N/A 05/17/2014   Procedure: PERCUTANEOUS CORONARY STENT INTERVENTION (PCI-S);  Surgeon: Sinclair Grooms, MD;  Location: [facility] CATH LAB: PCI pRCA stent ISR - Promus DES 3.0 x 8 (3.25), OM distal stent edge - Promus DES 2.75 x 12 (3.1)  . PERIPHERAL VASCULAR CATHETERIZATION  11/03/2016   Procedure: Embolization;  Surgeon: Waynetta Sandy, MD;  Location: [place] CV LAB;  Service: Cardiovascular;;  . POSTERIOR CERVICAL FUSION/FORAMINOTOMY N/A 05/02/2013   Procedure: POSTERIOR CERVICAL FUSION/FORAMINOTOMY CERVICAL SEVEN THORACIC-ONE;  Surgeon: Eustace Moore, MD;   Location: [place] [facility];  Service: Neurosurgery;  Laterality: N/A;  POSTERIOR CERVICAL FUSION/FORAMINOTOMY CERVICAL SEVEN THORACIC-ONE  . SPHINCTEROTOMY N/A 06/30/2015   Procedure: SPHINCTEROTOMY;  Surgeon: Ladene Artist, MD;  Location: [facility];  Service: Endoscopy;  Laterality: N/A;  . TOTAL KNEE ARTHROPLASTY Right   . TOTAL KNEE ARTHROPLASTY Left 12/20/2016   Procedure: LEFT TOTAL KNEE ARTHROPLASTY;  Surgeon: Vickey Huger, MD;  Location: [place];  Service: Orthopedics;  Laterality: Left;  . TRANSTHORACIC ECHOCARDIOGRAM  December 2013; October 2016   a. EF 55-60%. mod LA dilation. Aortic Sclerosis;; b. EF 55-60%, Gr 1 DD, Mild-Mod AS (Peark - Mean Gradient 18-10 mmHg)  . TRANSTHORACIC ECHOCARDIOGRAM  07/2016   EF 45-50%. Inferolateral hypokinesis (correlates with scar noted on Myoview). GR 1 DD. Mild aortic stenosis. Mod LAE.  Marland Kitchen VISCERAL ANGIOGRAM  11/03/2016   Procedure: Visceral Angiogram;  Surgeon: Waynetta Sandy, MD;  Location: [place] CV LAB;  Service: Cardiovascular;;   Social History Social History   Tobacco Use  . Smoking status: Former Smoker    Types: Cigarettes    Last attempt to quit: 10/05/1983    Years since quitting: 34.5  . Smokeless tobacco: Never Used  Substance Use Topics  . Alcohol use: Yes    Alcohol/week: 0.6 oz    Types: 1 Glasses of wine per week    Comment:  4 oz. wine daily  . Drug use: No   Family History Family History  Problem Relation Age of Onset  . Arthritis Mother   . Diabetes Father   . Heart disease Father   . Stroke Sister   . Hypertension Sister   . Heart disease Sister   . Diabetes Sister   . Breast cancer Sister   . Heart disease Brother   . Ulcers Brother   . Colon cancer Neg Hx    Current Outpatient Medications on File Prior to Visit  Medication Sig Dispense Refill  . acetaminophen (TYLENOL) 500 MG tablet Take 1,000 mg by mouth every 6 (six) hours as needed for mild pain, moderate pain or headache. Reported on  03/16/2016    . albuterol (PROVENTIL HFA;VENTOLIN HFA) 108 (90 Base) MCG/ACT inhaler Inhale 2 puffs into the lungs every 6 (six) hours as needed for wheezing or shortness of breath. 1 Inhaler 0  . amLODipine (NORVASC) 5 MG tablet TAKE 5 MG BY MOUTH DAILY....need appointment before further refills (Patient taking differently: Take 5 mg by mouth daily. ) 90 tablet 0  . aspirin EC 81 MG EC tablet Take 1 tablet (81 mg total) by mouth daily. 30 tablet 11  . Choline Fenofibrate (FENOFIBRIC ACID) 135 MG CPDR TAKE 1 CAPSULE BY MOUTH ONCE DAILY 90 capsule 3  . clopidogrel (PLAVIX) 75 MG tablet TAKE 1 TABLET BY MOUTH ONCE DAILY 90 tablet 1  . ezetimibe (ZETIA) 10 MG tablet TAKE 1 TABLET BY MOUTH ONCE DAILY 90 tablet 3  . ferrous sulfate 325 (65 FE) MG tablet Take 325 mg by mouth daily with breakfast.    . glucose blood (ONETOUCH VERIO) test strip USE ONE STRIP TO CHECK GLUCOSE TWICE DAILY E11.9 180 each 2  . losartan (COZAAR) 100 MG tablet TAKE 1 TABLET BY MOUTH ONCE DAILY 90 tablet 1  . metFORMIN (GLUCOPHAGE) 500 MG tablet TAKE 1 TABLET BY MOUTH ONCE DAILY WITH BREAKFAST 90 tablet 1  . Multiple Vitamins-Minerals (MULTIVITAMIN WITH MINERALS) tablet Take 1 tablet by mouth daily.     . nitroGLYCERIN (NITROSTAT) 0.4 MG SL tablet Place 1 tablet (0.4 mg total) under the tongue every 5 (five) minutes x 3 doses as needed for chest pain. 25 tablet 12  . pantoprazole (PROTONIX) 40 MG tablet TAKE 1 TABLET BY MOUTH ONCE DAILY 90 tablet 1  . ranolazine (RANEXA) 500 MG 12 hr tablet Take 1 tablet (500 mg total) by mouth 2 (two) times daily. 60 tablet 8  .  sildenafil (REVATIO) 20 MG tablet TAKE 2-5 TABLETS BY MOUTH AS NEEDED FOR SEXUAL ACTIVITY 50 tablet 5  . amoxicillin (AMOXIL) 875 MG tablet Take 1 tablet (875 mg total) by mouth 2 (two) times daily. (Patient not taking: Reported on 04/21/2018) 20 tablet 0  . benzonatate (TESSALON) 200 MG capsule Take 1 capsule (200 mg total) by mouth 3 (three) times daily as needed for  cough. (Patient not taking: Reported on 04/21/2018) 30 capsule 0  . budesonide-formoterol (SYMBICORT) 80-4.5 MCG/ACT inhaler Inhale 2 puffs into the lungs 2 (two) times daily. (Patient not taking: Reported on 04/21/2018) 1 Inhaler 12  . Evolocumab (REPATHA) 140 MG/ML SOSY Inject into the skin.    . predniSONE (DELTASONE) 10 MG tablet 6/5/4/3/2/1 taper (Patient not taking: Reported on 04/21/2018) 21 tablet 0  . rosuvastatin (CRESTOR) 20 MG tablet Take 1 tablet (20 mg total) by mouth daily. (Patient not taking: Reported on 01/06/2018) 90 tablet 3   No current facility-administered medications on file prior to visit.    Allergies  Allergen Reactions  . Oxycodone Shortness Of Breath and Cough  . Lisinopril Cough  . Statins Other (See Comments)    MYALGIAS   . Welchol [Colesevelam Hcl] Itching     ROS: See HPI for pertinent positives and negatives.  Physical Examination  Vitals:   04/21/18 1020 04/21/18 1022  BP: (!) 149/83 (!) 157/91  Pulse: 63   Resp: 16   Temp: (!) 97.3 F (36.3 C)   TempSrc: Oral   SpO2: 99%   Weight: 215 lb 6.4 oz (97.7 kg)   Height: 5\' 10"  (1.778 m)    Body mass index is 30.91 kg/m.  General:A&O x 3, WD, obese male HEENT: No gross abnormalities  Pulmonary: Sym exp, respirations are non labored, limited air movement in all fields, rales in lower half of left base, no rhonchi or wheezing.  Cardiac: Regular rhythm and rate, no murmur appreciated  Vascular: Vessel Right Left  Radial 1+Palpable 2+Palpable  Carotid  without bruit  without bruit  Aorta Not palpable N/A  Femoral 2+Palpable 2+Palpable  Popliteal Not palpable Not palpable  PT notPalpable notPalpable  DP 2+Palpable notPalpable   Gastrointestinal: soft, NTND, -G/R, - HSM, - palpable masses, - CVAT B. Musculoskeletal: M/S 5/5 throughout, extremities without ischemic changes. 1+ bilateral pretibial pitting edema. Skin: No rashes, no ulcers, no cellulitis.   Neurologic: Pain and light touch  intact in extremities, Motor exam as listed above. CN 2-12 intact except is hard of hearing.  Psychiatric: Normal thought content, mood appropriate for clinical situation.    DATA  EVAR Duplex   EVAR Duplex (Date: 10/21/17):  AAA sac size: 5.0 cm x 4.3 cm; Right CIA: 1.4 cm; Left CIA: 1.5 cm. Limitations to exam were air/bowel gas.   no endoleak detected  CTA Abd/Pelvis (03-21-17): IMPRESSION: VASCULAR 1. Significant interval improvement in the conspicuity of the type 2 endoleak following coil embolization of the right L4 lumbar artery. There is no more evidence of contrast enhancement posterior to the aortic limbs. Only a trace amount of contrast enhancement can be seen within the excluded aneurysm sac anterior to the limbs. The aneurysm sac itself remains stable at 5.6 x 4.5 cm in diameter. 2. Prior changes of endovascular aortic repair and coil embolization of the IMA and right L4 lumbar artery. 3. 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 4. Coronary artery calcifications.    Current (Date:  04-21-18)  AAA sac size: 5.3 cm; Right CIA: 1.1 cm; Left CIA: 1.4 cm. Limited visualization due to overlying bowel gas.   no endoleak detected   Medical Decision Making   [name] is a 82 y.o. male who presents s/p EVAR (Date: 08-13-15).  In 2018 he has also undergone stabilization of an IMA on 11-03-16 by Dr. Donzetta Matters,     Pt is asymptomatic with slight increase in sac size at 5.3 cm today compared to 5.0 cm on 10-21-17, both evaluations by duplex, limited visualization.  Unable to obtain the measurement of the right limb of the endo graft.     Pt has a hx of endoleak, embolization of a lumbar artery on 01-18-17 performed by Dr. Barbie Banner in interventional radiology.   Pt and wife states that Dr. Barbie Banner, interventional radiologist, office called pt and wants pt to schedule an appointment to  see him, and it appears that an order for CTA abd/pelvis has been entered for pt by Dr. Barbie Banner.  Dr. Donzetta Matters spoke with pt and wife; we will see pt back in a year, defer to Dr. Barbie Banner to evaluate pt by CTA abd/pelvis soon.    Dependent edema of lower legs: I advised pt to wear knee high compression hose during the day; see Patient Information.    I emphasized the importance of maximal medical management including strict control of blood pressure, blood glucose, and lipid levels, antiplatelet agents, obtaining regular exercise, and cessation of smoking.   Thank you for allowing [place] to participate in this patient's care.  Julian Chambers, RN, MSN, FNP-C [facility] [place] Office: [phone]  Clinic Physician: Donzetta Matters  04/21/2018, 10:45 AM

## 2018-04-23 ENCOUNTER — Encounter: Payer: Self-pay | Admitting: Family

## 2018-05-22 ENCOUNTER — Other Ambulatory Visit: Payer: Self-pay | Admitting: *Deleted

## 2018-05-22 DIAGNOSIS — I714 Abdominal aortic aneurysm, without rupture, unspecified: Secondary | ICD-10-CM

## 2018-05-24 ENCOUNTER — Ambulatory Visit
Admission: RE | Admit: 2018-05-24 | Discharge: 2018-05-24 | Disposition: A | Discharge status: Home / Self Care | Payer: Medicare Other | Source: Ambulatory Visit | Attending: Interventional Radiology | Admitting: Interventional Radiology

## 2018-05-24 ENCOUNTER — Ambulatory Visit (HOSPITAL_COMMUNITY)
Admission: RE | Admit: 2018-05-24 | Discharge: 2018-05-24 | Disposition: A | Discharge status: Home / Self Care | Payer: Medicare Other | Source: Ambulatory Visit | Attending: Interventional Radiology | Admitting: Interventional Radiology

## 2018-05-24 ENCOUNTER — Encounter (HOSPITAL_COMMUNITY): Payer: Self-pay

## 2018-05-24 DIAGNOSIS — I714 Abdominal aortic aneurysm, without rupture, unspecified: Secondary | ICD-10-CM

## 2018-05-24 DIAGNOSIS — T82330D Leakage of aortic (bifurcation) graft (replacement), subsequent encounter: Secondary | ICD-10-CM | POA: Diagnosis not present

## 2018-05-24 DIAGNOSIS — IMO0001 Reserved for inherently not codable concepts without codable children: Secondary | ICD-10-CM

## 2018-05-24 DIAGNOSIS — Y832 Surgical operation with anastomosis, bypass or graft as the cause of abnormal reaction of the patient, or of later complication, without mention of misadventure at the time of the procedure: Secondary | ICD-10-CM | POA: Insufficient documentation

## 2018-05-24 DIAGNOSIS — N280 Ischemia and infarction of kidney: Secondary | ICD-10-CM | POA: Insufficient documentation

## 2018-05-24 DIAGNOSIS — I719 Aortic aneurysm of unspecified site, without rupture: Secondary | ICD-10-CM | POA: Diagnosis not present

## 2018-05-24 HISTORY — PX: IR RADIOLOGIST EVAL & MGMT: IMG5224

## 2018-05-24 LAB — POCT I-STAT CREATININE: Creatinine, Ser: 1.6 mg/dL — ABNORMAL HIGH (ref 0.61–1.24)

## 2018-05-24 MED ORDER — IOPAMIDOL (ISOVUE-370) INJECTION 76%
80.0000 mL | Freq: Once | INTRAVENOUS | Status: AC | PRN
  Administered 2018-05-24: 80 mL via INTRAVENOUS

## 2018-05-24 MED ORDER — IOPAMIDOL (ISOVUE-370) INJECTION 76%
INTRAVENOUS | Status: AC
  Filled 2018-05-24: qty 100

## 2018-05-24 NOTE — Progress Notes (Signed)
[name], at [facility], was notified to place a message for Dr. Barbie Banner regarding [name]'s creatinine value and GFR. The patient was given a reduced contrast amount and instructed to drink plenty of fluids the rest of the day.

## 2018-05-24 NOTE — Progress Notes (Signed)
Patient ID: [name], male   DOB: 05-Sep-1936, 82 y.o.   MRN: [id number]       Chief Complaint: Patient was seen in consultation today for  Chief Complaint  Patient presents with  . Follow-up   at the request of Ivis Nicolson  Referring Physician(s): Tesia Lybrand  History of Present Illness: [name] is a 82 y.o. male who is status post aortobiiliac stent graft with a type II endoleak.  The type II endoleak has been treated with embolization of both the IMA and a right lumbar artery.  He returns for routine follow-up.  He denies any abdominal pain or lower extremity pain.  He does complain of some numbness in his feet but this is episodic.  He denies any strokelike symptoms or chest pain.  Past Medical History:  Diagnosis Date  . Adenomatous colon polyp   . Ankle edema    Chronic  . Asthma   . CAD S/P percutaneous coronary angioplasty 06/2003, 12/2005, 05/2014   a) '04: Staged Taxus DES PCI to RCA (2 Taxus 2.75 x 32 & 12) and Cx-OM (Taxus 3 x 20);;'07 - PCI to pRCA ISR- Cypher DES; c) 05/2014: pPCI pRCA stentISR (Promus DES 3 x 8) OM1 distal to stent (Promus DES 2.75 x 12 - 3.1)  . CAD, multiple vessel 1985   Most recent July 2018: Admitted for non-STEMI. Occluded LAD with patent LIMA (SP1 now occluded).  Patent stents in proximal and mid RCA as well as circumflex-OM 3. Known occlusion of SVG-OM. EF 45-50%  . Cholelithiasis - with cholangitis & choledocholithiasis    status post ERCP with removal of calculi and biliary stent placement.  . Chronic anemia    On iron supplement; history of positive guaiac - negative colonoscopy in 1996.; Thought to be related to hemorrhoids; status post hemorrhoidectomy  . Chronic back pain     multiple surgeries; C-spine and lumbar  . Diabetes mellitus    on oral medication  . Diverticulitis of colon 1996  . Diverticulosis   . Dyslipidemia, goal LDL below 70   . Erectile dysfunction   . Exertional dyspnea    Chronic baseline SOB with  ambulation  . GERD (gastroesophageal reflux disease)   . H/O: pneumonia February 14  . Hemorrhoids   . Hiatal hernia   . History of: ST elevation myocardial infarction (STEMI) involving left circumflex coronary artery with complication 2831   PTCA-circumflex; PCI in 1991  . Hypertension   . Moderate aortic stenosis by prior echocardiogram 07/2015   Normal LV function - EF 55-60%.. Abnormal relaxation. Mild-moderate aortic stenosis (peak/mean gradient 18/10 mmH)  . Osteoarthritis of both knees    And back; multiple back surgeries, right knee arthroplasty and left knee arthroscopic surgery x2  . Pneumonia 2016  . S/P AAA (abdominal aortic aneurysm) repair 08/30/2012   s/p EVAR  . S/P CABG x 2 1997   LIMA-LAD, SVG-OM  . Sleep apnea    pt. states he was told to return for f/u, to be fitted for Cpap, but pt. reports that he didn't follow up-no cpap used    Past Surgical History:  Procedure Laterality Date  . Abdominal and Lower Extremity Arterial Ultrasound  08/23/2012; 10/10/2013   Normal ABIs. Nonocclusive lower extremity disease. 4.2 cm x 4.3 cm infrarenal AAA;; 4.4 cm x 4.3 cm (essentially stable)   . ABDOMINAL AORTIC ENDOVASCULAR STENT GRAFT N/A 08/13/2015   Procedure: ABDOMINAL AORTIC ENDOVASCULAR STENT GRAFT;  Surgeon: Mal Misty, MD;  Location: [facility]  OR;  Service: Vascular;  Laterality: N/A;  . Anterior cervical plating  04/23/10   At C4-5 and a C6-7 utilizing two separate Biomet MaxAn plates.  . ANTERIOR LAT LUMBAR FUSION Left 11/22/2012   Procedure: ANTERIOR LATERAL LUMBAR FUSION 1 LEVEL;  Surgeon: Eustace Moore, MD;  Location: [facility]S;  Service: Neurosurgery;  Laterality: Left;  Anterior Lateral Lumbar Fusion Lumbar Three-Four  . BACK SURGERY  1979 & x 10   pt. remarks, "I have had about 10 back surgeries"  . BILIARY STENT PLACEMENT N/A 07/03/2014   Procedure: BILIARY STENT PLACEMENT;  Surgeon: Inda Castle, MD;  Location: [facility];  Service: Endoscopy;  Laterality:  N/A;  . CARDIAC CATHETERIZATION  September 2004   None Occluded vein graft to OM; diffuse RCA disease in the mid vessel, 80% circumflex-OM stenosis; follow on AV groove circumflex with sequential 90% stenoses and intervening saccular dilation   . CARDIAC CATHETERIZATION  March 2010   4 abnormal Myoview showing apical thinning (possibly due to apical LAD 95%) : 100% Occluded LAD after SV1, distal LAD grafted via LIMA -apical 95% . Cx -OM1 w/patent stent extending into OM 1 . Follow on Cx - 70-80% - non-amenable PCI. RCA widely patent 3 overlapping stents in mRCA w/less than 40% stenosisin RPL; SVG-OM known occluded   . CATARACT EXTRACTION    . Cervical arthrodesis  04/23/10   Anterior cervical arthrodesis, C4-5, C6-7 utilizing 7-mm PEEK interbody cage packed with local autograft & Antifuse putty at C4-5 & an 8-mm cage at C6-7.  Marland Kitchen CERVICAL DISCECTOMY  04/23/10   Decompressive anterior carvical diskectomy. C4-5, C6-7  . CHOLECYSTECTOMY N/A 05/23/2015   Procedure: LAPAROSCOPIC CHOLECYSTECTOMY WITH INTRAOPERATIVE CHOLANGIOGRAM;  Surgeon: Alphonsa Overall, MD;  Location: [facility];  Service: General;  Laterality: N/A;  . [place]  . CORONARY ANGIOPLASTY  1985,1991,15   1985 lateral STEMI Circumflex PTCA;   . CORONARY ARTERY BYPASS GRAFT  1997   LIMA-LAD, SVG-OM (SVG known to be occluded prior to 2004)  . ERCP N/A 07/03/2014   Procedure: ENDOSCOPIC RETROGRADE CHOLANGIOPANCREATOGRAPHY (ERCP);  Surgeon: Inda Castle, MD;  Location: [facility];  Service: Endoscopy;  Laterality: N/A;  . ERCP N/A 07/05/2014   Procedure: ENDOSCOPIC RETROGRADE CHOLANGIOPANCREATOGRAPHY (ERCP);  Surgeon: Inda Castle, MD;  Location: [facility];  Service: Endoscopy;  Laterality: N/A;  . ERCP N/A 06/30/2015   Procedure: ENDOSCOPIC RETROGRADE CHOLANGIOPANCREATOGRAPHY (ERCP);  Surgeon: Ladene Artist, MD;  Location: [facility];  Service: Endoscopy;  Laterality: N/A;  . EYE SURGERY    . IR ANGIOGRAM EXTREMITY LEFT   01/18/2017  . IR ANGIOGRAM PELVIS SELECTIVE OR SUPRASELECTIVE  01/18/2017  . IR ANGIOGRAM SELECTIVE EACH ADDITIONAL VESSEL  01/18/2017  . IR AORTAGRAM ABDOMINAL SERIALOGRAM  01/18/2017  . IR EMBO ARTERIAL NOT HEMORR HEMANG INC GUIDE ROADMAPPING  01/18/2017  . IR RADIOLOGIST EVAL & MGMT  01/04/2017  . IR RADIOLOGIST EVAL & MGMT  10/26/2017  . IR US GUIDE VASC ACCESS RIGHT  01/18/2017  . KNEE ARTHROSCOPY Left    x 2  . LEFT HEART CATH AND CORS/GRAFTS ANGIOGRAPHY N/A 04/20/2017   Procedure: Left Heart Cath and Cors/Grafts Angiography;  Surgeon: Lorretta Harp, MD;  Location: [facility] INVASIVE CV LAB;  LAD now occluded prior to SP1.LIMA-LAD. Patent RCA and circumflex stents. EF 45-50%. Relatively stable.   Marland Kitchen LEFT HEART CATHETERIZATION WITH CORONARY ANGIOGRAM N/A 05/15/2014   Procedure: LEFT HEART CATHETERIZATION WITH CORONARY ANGIOGRAM;  Surgeon: Sinclair Grooms, MD;  Location:  [facility] CATH LAB;  Service: Cardiovascular;  Laterality: N/A;  . LEFT HEART CATHETERIZATION WITH CORONARY/GRAFT ANGIOGRAM N/A 06/28/2014   Procedure: LEFT HEART CATHETERIZATION WITH Beatrix Fetters;  Surgeon: Troy Sine, MD;  Location: [facility] CATH LAB;  Service: Cardiovascular;  Laterality: N/A;  . LUMBAR PERCUTANEOUS PEDICLE SCREW 1 LEVEL N/A 11/22/2012   Procedure: LUMBAR PERCUTANEOUS PEDICLE SCREW 1 LEVEL;  Surgeon: Eustace Moore, MD;  Location: [place] NEURO ORS;  Service: Neurosurgery;  Laterality: N/A;  Lumbar Three-Four Percutaneous Pedicle Screw, Lateral approach  . MINOR HEMORRHOIDECTOMY    . NM MYOVIEW LTD  December 2013   LOW RISK. Mmoderate region of mid to basal inferolateral scar without ischemia. Mild apical hypokinesis with an EF of 47%.  Marland Kitchen PERCUTANEOUS CORONARY STENT INTERVENTION (PCI-S)  September 2004   PCI - RCA 2 overlapping Taxus DES 2.75 mm x 32 mm and 2.75 mm x 12 mm (3.0 mm); PCI-Cx-OM1 - Taxus DES 3.0 mm x 20 mm (3.1 mm);   Marland Kitchen PERCUTANEOUS CORONARY STENT INTERVENTION (PCI-S)  12/2005   80% ISR in proximal Taxus  stent in RCA -- covered proximally with Cypher DES 3.0 mm x 12 mm  . PERCUTANEOUS CORONARY STENT INTERVENTION (PCI-S) N/A 05/17/2014   Procedure: PERCUTANEOUS CORONARY STENT INTERVENTION (PCI-S);  Surgeon: Sinclair Grooms, MD;  Location: [facility] CATH LAB: PCI pRCA stent ISR - Promus DES 3.0 x 8 (3.25), OM distal stent edge - Promus DES 2.75 x 12 (3.1)  . PERIPHERAL VASCULAR CATHETERIZATION  11/03/2016   Procedure: Embolization;  Surgeon: Waynetta Sandy, MD;  Location: [place] CV LAB;  Service: Cardiovascular;;  . POSTERIOR CERVICAL FUSION/FORAMINOTOMY N/A 05/02/2013   Procedure: POSTERIOR CERVICAL FUSION/FORAMINOTOMY CERVICAL SEVEN THORACIC-ONE;  Surgeon: Eustace Moore, MD;  Location: [facility] NEURO ORS;  Service: Neurosurgery;  Laterality: N/A;  POSTERIOR CERVICAL FUSION/FORAMINOTOMY CERVICAL SEVEN THORACIC-ONE  . SPHINCTEROTOMY N/A 06/30/2015   Procedure: SPHINCTEROTOMY;  Surgeon: Ladene Artist, MD;  Location: [facility];  Service: Endoscopy;  Laterality: N/A;  . TOTAL KNEE ARTHROPLASTY Right   . TOTAL KNEE ARTHROPLASTY Left 12/20/2016   Procedure: LEFT TOTAL KNEE ARTHROPLASTY;  Surgeon: Vickey Huger, MD;  Location: [facility];  Service: Orthopedics;  Laterality: Left;  . TRANSTHORACIC ECHOCARDIOGRAM  December 2013; October 2016   a. EF 55-60%. mod LA dilation. Aortic Sclerosis;; b. EF 55-60%, Gr 1 DD, Mild-Mod AS (Peark - Mean Gradient 18-10 mmHg)  . TRANSTHORACIC ECHOCARDIOGRAM  07/2016   EF 45-50%. Inferolateral hypokinesis (correlates with scar noted on Myoview). GR 1 DD. Mild aortic stenosis. Mod LAE.  Marland Kitchen VISCERAL ANGIOGRAM  11/03/2016   Procedure: Visceral Angiogram;  Surgeon: Waynetta Sandy, MD;  Location: [place] CV LAB;  Service: Cardiovascular;;    Allergies: Oxycodone; Lisinopril; Statins; and Welchol [colesevelam hcl]  Medications: Prior to Admission medications   Medication Sig Start Date End Date Taking? Authorizing Provider  acetaminophen (TYLENOL) 500 MG tablet  Take 1,000 mg by mouth every 6 (six) hours as needed for mild pain, moderate pain or headache. Reported on 03/16/2016    [provider]  albuterol (PROVENTIL HFA;VENTOLIN HFA) 108 (90 Base) MCG/ACT inhaler Inhale 2 puffs into the lungs every 6 (six) hours as needed for wheezing or shortness of breath. 02/13/18   Tysinger, Camelia Eng, PA-C  amLODipine (NORVASC) 5 MG tablet TAKE 5 MG BY MOUTH DAILY....need appointment before further refills Patient taking differently: Take 5 mg by mouth daily.  09/07/16   Leonie Man, MD  amoxicillin (AMOXIL) 875 MG tablet Take  1 tablet (875 mg total) by mouth 2 (two) times daily. Patient not taking: Reported on 04/21/2018 02/13/18   Tysinger, Camelia Eng, PA-C  aspirin EC 81 MG EC tablet Take 1 tablet (81 mg total) by mouth daily. 04/23/17   Daune Perch, NP  benzonatate (TESSALON) 200 MG capsule Take 1 capsule (200 mg total) by mouth 3 (three) times daily as needed for cough. Patient not taking: Reported on 04/21/2018 02/16/18   Tysinger, Camelia Eng, PA-C  budesonide-formoterol [facility]) 80-4.5 MCG/ACT inhaler Inhale 2 puffs into the lungs 2 (two) times daily. Patient not taking: Reported on 04/21/2018 02/13/18   Carlena Hurl, PA-C  Choline Fenofibrate (FENOFIBRIC ACID) 135 MG CPDR TAKE 1 CAPSULE BY MOUTH ONCE DAILY 12/19/17   Leonie Man, MD  clopidogrel (PLAVIX) 75 MG tablet TAKE 1 TABLET BY MOUTH ONCE DAILY 02/20/18   Leonie Man, MD  Evolocumab (REPATHA) 140 MG/ML SOSY Inject into the skin.    [provider]  ezetimibe (ZETIA) 10 MG tablet TAKE 1 TABLET BY MOUTH ONCE DAILY 12/14/17   Leonie Man, MD  ferrous sulfate 325 (65 FE) MG tablet Take 325 mg by mouth daily with breakfast.    [provider]  glucose blood (ONETOUCH VERIO) test strip USE ONE STRIP TO CHECK GLUCOSE TWICE DAILY E11.9 06/15/17   Denita Lung, MD  losartan (COZAAR) 100 MG tablet TAKE 1 TABLET BY MOUTH ONCE DAILY 03/13/18   Leonie Man, MD  metFORMIN  (GLUCOPHAGE) 500 MG tablet TAKE 1 TABLET BY MOUTH ONCE DAILY WITH BREAKFAST 01/17/18   Denita Lung, MD  Multiple Vitamins-Minerals (MULTIVITAMIN WITH MINERALS) tablet Take 1 tablet by mouth daily.     [provider]  nitroGLYCERIN (NITROSTAT) 0.4 MG SL tablet Place 1 tablet (0.4 mg total) under the tongue every 5 (five) minutes x 3 doses as needed for chest pain. 04/22/17   Daune Perch, NP  pantoprazole (PROTONIX) 40 MG tablet TAKE 1 TABLET BY MOUTH ONCE DAILY 03/13/18   Leonie Man, MD  predniSONE (DELTASONE) 10 MG tablet 6/5/4/3/2/1 taper Patient not taking: Reported on 04/21/2018 02/21/18   Tysinger, Camelia Eng, PA-C  ranolazine (RANEXA) 500 MG 12 hr tablet Take 1 tablet (500 mg total) by mouth 2 (two) times daily. 10/24/17   Leonie Man, MD  rosuvastatin (CRESTOR) 20 MG tablet Take 1 tablet (20 mg total) by mouth daily. Patient not taking: Reported on 01/06/2018 10/13/17 01/11/18  Leonie Man, MD  sildenafil (REVATIO) 20 MG tablet TAKE 2-5 TABLETS BY MOUTH AS NEEDED FOR SEXUAL ACTIVITY 11/28/17   Denita Lung, MD     Family History  Problem Relation Age of Onset  . Arthritis Mother   . Diabetes Father   . Heart disease Father   . Stroke Sister   . Hypertension Sister   . Heart disease Sister   . Diabetes Sister   . Breast cancer Sister   . Heart disease Brother   . Ulcers Brother   . Colon cancer Neg Hx     Social History   Socioeconomic History  . Marital status: Married    Spouse name: Not on file  . Number of children: Not on file  . Years of education: Not on file  . Highest education level: Not on file  Occupational History  . Not on file  Social Needs  . Financial resource strain: Not on file  . Food insecurity:    Worry: Not on file  Inability: Not on file  . Transportation needs:    Medical: Not on file    Non-medical: Not on file  Tobacco Use  . Smoking status: Former Smoker    Types: Cigarettes    Last attempt to quit: 10/05/1983     Years since quitting: 34.6  . Smokeless tobacco: Never Used  Substance and Sexual Activity  . Alcohol use: Yes    Alcohol/week: 1.0 standard drinks    Types: 1 Glasses of wine per week    Comment:  4 oz. wine daily  . Drug use: No  . Sexual activity: Not Currently  Lifestyle  . Physical activity:    Days per week: Not on file    Minutes per session: Not on file  . Stress: Not on file  Relationships  . Social connections:    Talks on phone: Not on file    Gets together: Not on file    Attends religious service: Not on file    Active member of club or organization: Not on file    Attends meetings of clubs or organizations: Not on file    Relationship status: Not on file  Other Topics Concern  . Not on file  Social History Narrative   He is married, father of two, grandfather to 57, great grandfather to two.    Not really getting much exercise now, do to his significant back and hip pain.    He does not smoke and only has an alcoholic beverage.      Review of Systems: A 12 point ROS discussed and pertinent positives are indicated in the HPI above.  All other systems are negative.  Review of Systems  Vital Signs: BP (!) 156/72   Pulse 76   Temp 97.6 F (36.4 C) (Oral)   Resp 16   Ht 5\' 9"  (1.753 m)   Wt 97.5 kg   SpO2 98%   BMI 31.75 kg/m   Physical Exam He is in no apparent distress.  Posterior tibial pulses are 2+ bilaterally.  Dorsalis pedis pulses are 1+ bilaterally.  Neurological exam is nonfocal.    Imaging: Ct Angio Abd/pel W/ And/or W/o  Result Date: 05/24/2018 CLINICAL DATA:  Aortic aneurysm status post stent graft EXAM: CTA ABDOMEN AND PELVIS wITHOUT AND WITH CONTRAST TECHNIQUE: Multidetector CT imaging of the abdomen and pelvis was performed using the standard protocol during bolus administration of intravenous contrast. Multiplanar reconstructed images and MIPs were obtained and reviewed to evaluate the vascular anatomy. CONTRAST:  26mL ISOVUE-370  IOPAMIDOL (ISOVUE-370) INJECTION 76% COMPARISON:  03/21/2017 FINDINGS: VASCULAR Aorta: Aorto bi-iliac stent graft is stable in position. The aortic limb is patent. A very subtle type 2 endoleak along the anterior right side of the aneurysm sac is unchanged. There is very subtle enhancement of the aneurysm sac on venous phase images. Overall AP and transverse diameters of the sac are 5.4 and 4.5 cm compared with 5.6 and 4.5 cm on the prior study. This has slightly improved. Embolization coils are present in right lumbar for arteries extending into the aneurysm sac. Celiac: Patent. SMA: Patent. Renals: Single left renal artery and 2 right renal arteries are patent. The accessory and inferior right renal artery faintly opacifies. IMA: Occluded by coil embolization. Inflow: Right iliac stent graft is patent. Right common, internal, and external iliac arteries are patent. Left iliac stent graft is patent. Left common, internal, and external iliac arteries are patent. Proximal Outflow: Grossly patent. Veins: No obvious DVT. Review of the MIP  images confirms the above findings. NON-VASCULAR Lower chest: Dependent atelectasis. Hepatobiliary: Postcholecystectomy.  Liver is unremarkable. Pancreas: Unremarkable Spleen: Unremarkable Adrenals/Urinary Tract: Chronic ischemic changes at the lower pole of both kidneys is present. Right adrenal gland is unremarkable. 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 It is stable measuring 2.8 cm. Bladder is relatively decompressed. Stomach/Bowel: Normal appendix. No obvious mass in the colon. Small and large bowel are decompressed. Sigmoid diverticulosis. Moderate-sized hiatal hernia. Lymphatic: No abnormal retroperitoneal adenopathy. Reproductive: Mild prostate enlargement. Other: No free fluid. Musculoskeletal: Postoperative changes in the lumbar spine are noted. No vertebral compression deformity. IMPRESSION: VASCULAR Aorto bi-iliac stent graft remains  patent and there is a stable subtle type 2 endoleak. Maximal sac diameter has slightly diminished from 5.6 x 4.5 cm to 5.4 x 4.5 cm. NON-VASCULAR Stable chronic ischemic changes in the lower poles of both kidneys. Electronically Signed   By: Marybelle Killings M.D.   On: 05/24/2018 15:08    Labs:  CBC: Recent Labs    07/20/17 1056  WBC 4.4  HGB 11.0*  HCT 33.0*  PLT 214    COAGS: No results for input(s): INR, APTT in the last 8760 hours.  BMP: Recent Labs    05/24/18 1314  CREATININE 1.60*    LIVER FUNCTION TESTS: Recent Labs    06/20/17 1216  BILITOT 0.5  AST 27  ALT 21  ALKPHOS 33*  PROT 6.5  ALBUMIN 4.0    TUMOR MARKERS: No results for input(s): AFPTM, CEA, CA199, CHROMGRNA in the last 8760 hours.  Assessment and Plan:  The aortobiiliac stent graft remains patent and there has been some further improvement in the type II endoleak.  The subtle endoleak is still visible on arterial phase imaging but the overall aneurysm sac diameter has slightly diminished in size.  He is asymptomatic.  No urgent treatment is recommended at this time and he is to follow-up in 1 year for a follow-up CT angiogram.  Thank you for this interesting consult.  I greatly enjoyed meeting [name] and look forward to participating in their care.  A copy of this report was sent to the requesting provider on this date.  Electronically Signed: Ananias Kolander, ART A 05/24/2018, 4:17 PM   I spent a total of   15 Minutes in face to face in clinical consultation, greater than 50% of which was counseling/coordinating care for aortobiiliac stent graft and type II endoleak.

## 2018-06-12 ENCOUNTER — Telehealth: Payer: Self-pay | Admitting: Cardiology

## 2018-06-12 DIAGNOSIS — I25119 Atherosclerotic heart disease of native coronary artery with unspecified angina pectoris: Secondary | ICD-10-CM

## 2018-06-12 DIAGNOSIS — E785 Hyperlipidemia, unspecified: Secondary | ICD-10-CM

## 2018-06-12 NOTE — Telephone Encounter (Signed)
Returned call to wife (ok per DPR)-she states at [facility] in January they were told lab slips would be mailed to them in 4-5 months.  They have not received them and were wondering if we could place the orders.  Advised orders to be placed.  Lab hours and instructions given.  Wife aware and verbalized understanding.

## 2018-06-12 NOTE — Telephone Encounter (Signed)
New Message:   Patient  Wife calling about result

## 2018-07-04 DIAGNOSIS — I25119 Atherosclerotic heart disease of native coronary artery with unspecified angina pectoris: Secondary | ICD-10-CM | POA: Diagnosis not present

## 2018-07-04 DIAGNOSIS — E785 Hyperlipidemia, unspecified: Secondary | ICD-10-CM | POA: Diagnosis not present

## 2018-07-04 HISTORY — PX: TRANSTHORACIC ECHOCARDIOGRAM: SHX275

## 2018-07-04 LAB — COMPREHENSIVE METABOLIC PANEL
ALK PHOS: 32 IU/L — AB (ref 39–117)
ALT: 25 IU/L (ref 0–44)
AST: 29 IU/L (ref 0–40)
Albumin/Globulin Ratio: 2 (ref 1.2–2.2)
Albumin: 4.3 g/dL (ref 3.5–4.7)
BILIRUBIN TOTAL: 0.7 mg/dL (ref 0.0–1.2)
BUN/Creatinine Ratio: 17 (ref 10–24)
BUN: 33 mg/dL — ABNORMAL HIGH (ref 8–27)
CHLORIDE: 104 mmol/L (ref 96–106)
CO2: 21 mmol/L (ref 20–29)
CREATININE: 1.99 mg/dL — AB (ref 0.76–1.27)
Calcium: 9.6 mg/dL (ref 8.6–10.2)
GFR calc Af Amer: 36 mL/min/{1.73_m2} — ABNORMAL LOW (ref >59)
GFR calc non Af Amer: 31 mL/min/{1.73_m2} — ABNORMAL LOW (ref >59)
GLOBULIN, TOTAL: 2.1 g/dL (ref 1.5–4.5)
GLUCOSE: 116 mg/dL — AB (ref 65–99)
Potassium: 5.2 mmol/L (ref 3.5–5.2)
SODIUM: 141 mmol/L (ref 134–144)
Total Protein: 6.4 g/dL (ref 6.0–8.5)

## 2018-07-04 LAB — LIPID PANEL
CHOLESTEROL TOTAL: 185 mg/dL (ref 100–199)
Chol/HDL Ratio: 3.9 ratio (ref 0.0–5.0)
HDL: 47 mg/dL (ref >39)
LDL CALC: 115 mg/dL — AB (ref 0–99)
TRIGLYCERIDES: 115 mg/dL (ref 0–149)
VLDL Cholesterol Cal: 23 mg/dL (ref 5–40)

## 2018-07-06 ENCOUNTER — Telehealth: Payer: Self-pay | Admitting: *Deleted

## 2018-07-06 DIAGNOSIS — E878 Other disorders of electrolyte and fluid balance, not elsewhere classified: Secondary | ICD-10-CM

## 2018-07-06 DIAGNOSIS — Z79899 Other long term (current) drug therapy: Secondary | ICD-10-CM

## 2018-07-06 DIAGNOSIS — R7989 Other specified abnormal findings of blood chemistry: Secondary | ICD-10-CM

## 2018-07-06 NOTE — Telephone Encounter (Signed)
New Message   Patient's wife states that she needs a return phone call because there discrepancy in the blood work. Please call to discuss.

## 2018-07-06 NOTE — Telephone Encounter (Signed)
Called patient's wife back. Patient's wife thought patient's lab work was mixed up with her son's lab work (they have the same name). Patient's wife stated that patient's lab work was compared to lab work done a month ago and she stated patient did not have lab work done a month ago. Reviewed chart and discovered patient had a creatinine done before a CT scan a month ago, and that was the lab Ivin Booty was referring to. Informed patient's wife that the right lab results are in the right patient's chart. Patient's wife thanked me for explaining and verbalized understanding.

## 2018-07-06 NOTE — Telephone Encounter (Signed)
Spoke to patient. Result given . Verbalized understanding  RNasked patient if he was still using repatha. Patient  states he "could not  take that stuff"  AWARE LABS ARE NEEDED . ORDER PLACED.

## 2018-07-06 NOTE — Telephone Encounter (Signed)
-----   Message from Leonie Man, MD sent at 07/05/2018  6:55 PM EDT ----- Follow-up labs show worsening kidney function with a creatinine of 1.99.  Looks borderline dehydrated. -->  Recommend increasing hydration.  Glasses. Recheck BMP in 1 weeks.  Cholesterol levels show slightly worsened LDL from before.  Up to 115 from 96.  Total cholesterol is up to 195 from 158.  I think is the reason why we are doing Repatha.  Glenetta Hew, MD

## 2018-07-12 ENCOUNTER — Ambulatory Visit (HOSPITAL_COMMUNITY): Payer: Medicare Other | Attending: Cardiovascular Disease

## 2018-07-12 ENCOUNTER — Other Ambulatory Visit: Payer: Self-pay

## 2018-07-12 DIAGNOSIS — I5032 Chronic diastolic (congestive) heart failure: Secondary | ICD-10-CM | POA: Diagnosis not present

## 2018-07-12 DIAGNOSIS — I110 Hypertensive heart disease with heart failure: Secondary | ICD-10-CM | POA: Diagnosis not present

## 2018-07-12 DIAGNOSIS — I350 Nonrheumatic aortic (valve) stenosis: Secondary | ICD-10-CM

## 2018-07-12 DIAGNOSIS — I25119 Atherosclerotic heart disease of native coronary artery with unspecified angina pectoris: Secondary | ICD-10-CM | POA: Diagnosis not present

## 2018-07-12 DIAGNOSIS — T82855D Stenosis of coronary artery stent, subsequent encounter: Secondary | ICD-10-CM | POA: Insufficient documentation

## 2018-07-13 ENCOUNTER — Other Ambulatory Visit: Payer: Self-pay | Admitting: Family Medicine

## 2018-07-13 DIAGNOSIS — E118 Type 2 diabetes mellitus with unspecified complications: Secondary | ICD-10-CM

## 2018-07-17 ENCOUNTER — Ambulatory Visit (INDEPENDENT_AMBULATORY_CARE_PROVIDER_SITE_OTHER): Payer: Medicare Other | Admitting: Cardiology

## 2018-07-17 ENCOUNTER — Encounter: Payer: Self-pay | Admitting: Cardiology

## 2018-07-17 VITALS — BP 152/80 | HR 66 | Ht 69.0 in | Wt 221.4 lb

## 2018-07-17 DIAGNOSIS — I5032 Chronic diastolic (congestive) heart failure: Secondary | ICD-10-CM

## 2018-07-17 DIAGNOSIS — I25119 Atherosclerotic heart disease of native coronary artery with unspecified angina pectoris: Secondary | ICD-10-CM | POA: Diagnosis not present

## 2018-07-17 DIAGNOSIS — E878 Other disorders of electrolyte and fluid balance, not elsewhere classified: Secondary | ICD-10-CM | POA: Diagnosis not present

## 2018-07-17 DIAGNOSIS — E669 Obesity, unspecified: Secondary | ICD-10-CM

## 2018-07-17 DIAGNOSIS — N183 Chronic kidney disease, stage 3 unspecified: Secondary | ICD-10-CM

## 2018-07-17 DIAGNOSIS — T82855S Stenosis of coronary artery stent, sequela: Secondary | ICD-10-CM

## 2018-07-17 DIAGNOSIS — I350 Nonrheumatic aortic (valve) stenosis: Secondary | ICD-10-CM

## 2018-07-17 DIAGNOSIS — I110 Hypertensive heart disease with heart failure: Secondary | ICD-10-CM | POA: Diagnosis not present

## 2018-07-17 DIAGNOSIS — R7989 Other specified abnormal findings of blood chemistry: Secondary | ICD-10-CM | POA: Diagnosis not present

## 2018-07-17 DIAGNOSIS — E785 Hyperlipidemia, unspecified: Secondary | ICD-10-CM | POA: Diagnosis not present

## 2018-07-17 DIAGNOSIS — Z79899 Other long term (current) drug therapy: Secondary | ICD-10-CM | POA: Diagnosis not present

## 2018-07-17 LAB — BASIC METABOLIC PANEL
BUN / CREAT RATIO: 13 (ref 10–24)
BUN: 19 mg/dL (ref 8–27)
CHLORIDE: 107 mmol/L — AB (ref 96–106)
CO2: 21 mmol/L (ref 20–29)
Calcium: 9.4 mg/dL (ref 8.6–10.2)
Creatinine, Ser: 1.52 mg/dL — ABNORMAL HIGH (ref 0.76–1.27)
GFR calc non Af Amer: 43 mL/min/{1.73_m2} — ABNORMAL LOW (ref >59)
GFR, EST AFRICAN AMERICAN: 49 mL/min/{1.73_m2} — AB (ref >59)
GLUCOSE: 100 mg/dL — AB (ref 65–99)
POTASSIUM: 5.4 mmol/L — AB (ref 3.5–5.2)
SODIUM: 142 mmol/L (ref 134–144)

## 2018-07-17 MED ORDER — FUROSEMIDE 20 MG PO TABS: ORAL_TABLET | ORAL | 3 refills | Status: DC

## 2018-07-17 NOTE — Progress Notes (Signed)
PCP: Denita Lung, MD  Clinic Note: Chief Complaint  Patient presents with  . Follow-up    Delayed 57-month  . Coronary Artery Disease    No angina, but exertional dyspnea.  Stable  . Edema    Bilateral ankles    HPI: [name] is a 82 y.o. male with a complicated CAD and AAA history noted below who presents today for delayed 12-month follow-up.  Long-standing history of CAD dating back to 1985:  Inferior STEMI 1985: Eventually led to two-vessel CABG (LIMA-LAD, SVG-OM) in [place] showed occlusion of the SVG-OM.  --> PCI in 2004 and 2007, 2015 and then most recently 2018.  '04: Staged Taxus DES PCI to RCA (2 Taxus 2.75 x 32 & 12) and Cx-OM (Taxus 3 x 20);;  '07 - PCI to pRCA ISR- Cypher DES  PCI August 2015 -- PCI to the RCA & OM (in-stent restenosis, and OM). Follow-up Showed patent stents and grafts.  July 18-20, 2018 non-STEMI: Cath revealed CTO occlusion of pLAD (not PCI target). Patent LIMA-LAD & RCA with patent LCx-OM stents  -- med Rx:   He was put back on Plavix. Started on Ranexa; patent LIMA-LAD and RCA as well as circumflex stents  He also has history of AAA s/p EVAR   April 2018:  Embolization of Lumbar Artery- VIR (Dr. Barbie [name]).  Jan 2018: IMA "stabilization" by Dr. Donzetta [name].  June 2018-- f/u with Dr [name] ([facility]) for AAA -  Noted to have a type II endoleak.    Coil embolization March 25, 2017  Describes claudication symptoms in both calves after walking to having 3 blocks  He is intolerant of statins currently on fenofibrate (having stopped Zetia).  Recent Hospitalizations: .none  I haven't seen him since January of this year when we did preoperative evaluation for knee surgery.  At that time he is doing fairly well.  No chest pain with rest or exertion.  He has a stable exertional dyspnea but no change.  Studies Personally Reviewed - (if available, images/films reviewed: From Epic Chart or Care Everywhere)  None  Interval  History: [name] presents today for the most part stable from cardiac standpoint with the exception of starting to notice a little bit more than usual ankle edema.  Is vascular surgery evaluation said that his AAA was relatively stable.  He still has a moderate not significantly limiting claudication.  More limited by his stable baseline exertional dyspnea.  He has not had any anginal symptoms with rest or exertion. He denies any PND orthopnea, but he has been noticing this edema this may be 1-2+ bilaterally now.  With the edema he has been a little bit more short of breath on exertion. As usual, he denies any rapid heartbeats/palpitations.  He denies any syncope/near syncope or TIA/amaurosis fugax.  Edema seems more persistent now, and does not go down as much as usual when he puts his feet up.  ROS: A comprehensive was performed. Review of Systems  Constitutional: Negative for malaise/fatigue and weight loss.  HENT: Negative for congestion and nosebleeds.   Respiratory: Positive for shortness of breath (Stable exertional dyspnea).   Cardiovascular: Positive for claudication (Hard to tell if this is claudication, but notes leg fatigue after couple blocks.).       Per history of present illness  Gastrointestinal: Negative for abdominal pain, blood in stool, heartburn and melena.  Genitourinary: Positive for frequency (Baseline nocturia). Negative for hematuria.  Musculoskeletal: Positive for joint pain (  Normal for his pains).  Neurological: Negative for dizziness.  Psychiatric/Behavioral: Negative for depression. The patient is not nervous/anxious and does not have insomnia.   All other systems reviewed and are negative.  I have reviewed and (if needed) personally updated the patient's problem list, medications, allergies, past medical and surgical history, social and family history.   Past Medical History:  Diagnosis Date  . Adenomatous colon polyp   . Ankle edema    Chronic  . Asthma   .  CAD S/P percutaneous coronary angioplasty 06/2003, 12/2005, 05/2014   a) '04: Staged Taxus DES PCI to RCA (2 Taxus 2.75 x 32 & 12) and Cx-OM (Taxus 3 x 20);;'07 - PCI to pRCA ISR- Cypher DES; c) 05/2014: pPCI pRCA stentISR (Promus DES 3 x 8) OM1 distal to stent (Promus DES 2.75 x 12 - 3.1)  . CAD, multiple vessel 1985   Most recent July 2018: Admitted for non-STEMI. Occluded LAD with patent LIMA (SP1 now occluded).  Patent stents in proximal and mid RCA as well as circumflex-OM 3. Known occlusion of SVG-OM. EF 45-50%  . Cholelithiasis - with cholangitis & choledocholithiasis    status post ERCP with removal of calculi and biliary stent placement.  . Chronic anemia    On iron supplement; history of positive guaiac - negative colonoscopy in 1996.; Thought to be related to hemorrhoids; status post hemorrhoidectomy  . Chronic back pain     multiple surgeries; C-spine and lumbar  . Diabetes mellitus    on oral medication  . Diverticulitis of colon 1996  . Diverticulosis   . Dyslipidemia, goal LDL below 70   . Erectile dysfunction   . Exertional dyspnea    Chronic baseline SOB with ambulation  . GERD (gastroesophageal reflux disease)   . H/O: pneumonia February 14  . Hemorrhoids   . Hiatal hernia   . History of: ST elevation myocardial infarction (STEMI) involving left circumflex coronary artery with complication 8413   PTCA-circumflex; PCI in 1991  . Hypertension   . Moderate aortic stenosis by prior echocardiogram 07/2015   Normal LV function - EF 55-60%.. Abnormal relaxation. Mild-moderate aortic stenosis (peak/mean gradient 18/10 mmH)  . Osteoarthritis of both knees    And back; multiple back surgeries, right knee arthroplasty and left knee arthroscopic surgery x2  . Pneumonia 2016  . S/P AAA (abdominal aortic aneurysm) repair 08/30/2012   s/p EVAR  . S/P CABG x 2 1997   LIMA-LAD, SVG-OM  . Sleep apnea    pt. states he was told to return for f/u, to be fitted for Cpap, but pt. reports  that he didn't follow up-no cpap used    Past Surgical History:  Procedure Laterality Date  . Abdominal and Lower Extremity Arterial Ultrasound  08/23/2012; 10/10/2013   Normal ABIs. Nonocclusive lower extremity disease. 4.2 cm x 4.3 cm infrarenal AAA;; 4.4 cm x 4.3 cm (essentially stable)   . ABDOMINAL AORTIC ENDOVASCULAR STENT GRAFT N/A 08/13/2015   Procedure: ABDOMINAL AORTIC ENDOVASCULAR STENT GRAFT;  Surgeon: Mal Misty, MD;  Location: [facility];  Service: Vascular;  Laterality: N/A;  . Anterior cervical plating  04/23/10   At C4-5 and a C6-7 utilizing two separate Biomet MaxAn plates.  . ANTERIOR LAT LUMBAR FUSION Left 11/22/2012   Procedure: ANTERIOR LATERAL LUMBAR FUSION 1 LEVEL;  Surgeon: Eustace Moore, MD;  Location: [facility];  Service: Neurosurgery;  Laterality: Left;  Anterior Lateral Lumbar Fusion Lumbar Three-Four  . [place] x  10   pt. remarks, "I have had about 10 back surgeries"  . BILIARY STENT PLACEMENT N/A 07/03/2014   Procedure: BILIARY STENT PLACEMENT;  Surgeon: Inda Castle, MD;  Location: [facility];  Service: Endoscopy;  Laterality: N/A;  . CARDIAC CATHETERIZATION  September 2004   None Occluded vein graft to OM; diffuse RCA disease in the mid vessel, 80% circumflex-OM stenosis; follow on AV groove circumflex with sequential 90% stenoses and intervening saccular dilation   . CARDIAC CATHETERIZATION  March 2010   4 abnormal Myoview showing apical thinning (possibly due to apical LAD 95%) : 100% Occluded LAD after SV1, distal LAD grafted via LIMA -apical 95% . Cx -OM1 w/patent stent extending into OM 1 . Follow on Cx - 70-80% - non-amenable PCI. RCA widely patent 3 overlapping stents in mRCA w/less than 40% stenosisin RPL; SVG-OM known occluded   . CATARACT EXTRACTION    . Cervical arthrodesis  04/23/10   Anterior cervical arthrodesis, C4-5, C6-7 utilizing 7-mm PEEK interbody cage packed with local autograft & Antifuse putty at C4-5 & an 8-mm cage at  C6-7.  Marland Kitchen CERVICAL DISCECTOMY  04/23/10   Decompressive anterior carvical diskectomy. C4-5, C6-7  . CHOLECYSTECTOMY N/A 05/23/2015   Procedure: LAPAROSCOPIC CHOLECYSTECTOMY WITH INTRAOPERATIVE CHOLANGIOGRAM;  Surgeon: Alphonsa Overall, MD;  [facility]: [facility];  Service: General;  Laterality: N/A;  . Nina  . CORONARY ANGIOPLASTY  1985,1991,15   1985 lateral STEMI Circumflex PTCA;   . CORONARY ARTERY BYPASS GRAFT  1997   LIMA-LAD, SVG-OM (SVG known to be occluded prior to 2004)  . ERCP N/A 07/03/2014   Procedure: ENDOSCOPIC RETROGRADE CHOLANGIOPANCREATOGRAPHY (ERCP);  Surgeon: Inda Castle, MD;  Location: [facility];  Service: Endoscopy;  Laterality: N/A;  . ERCP N/A 07/05/2014   Procedure: ENDOSCOPIC RETROGRADE CHOLANGIOPANCREATOGRAPHY (ERCP);  Surgeon: Inda Castle, MD;  Location: [facility];  Service: Endoscopy;  Laterality: N/A;  . ERCP N/A 06/30/2015   Procedure: ENDOSCOPIC RETROGRADE CHOLANGIOPANCREATOGRAPHY (ERCP);  Surgeon: Ladene Artist, MD;  Location: [facility];  Service: Endoscopy;  Laterality: N/A;  . EYE SURGERY    . IR ANGIOGRAM EXTREMITY LEFT  01/18/2017  . IR ANGIOGRAM PELVIS SELECTIVE OR SUPRASELECTIVE  01/18/2017  . IR ANGIOGRAM SELECTIVE EACH ADDITIONAL VESSEL  01/18/2017  . IR AORTAGRAM ABDOMINAL SERIALOGRAM  01/18/2017  . IR EMBO ARTERIAL NOT HEMORR HEMANG INC GUIDE ROADMAPPING  01/18/2017  . IR RADIOLOGIST EVAL & MGMT  01/04/2017  . IR RADIOLOGIST EVAL & MGMT  10/26/2017  . IR RADIOLOGIST EVAL & MGMT  05/24/2018  . IR US GUIDE VASC ACCESS RIGHT  01/18/2017  . KNEE ARTHROSCOPY Left    x 2  . LEFT HEART CATH AND CORS/GRAFTS ANGIOGRAPHY N/A 04/20/2017   Procedure: Left Heart Cath and Cors/Grafts Angiography;  Surgeon: Lorretta Harp, MD;  Location: [facility]AS[facility] CV [facility];  LAD now occluded prior to SP1.LIMA-LAD. Patent RCA and circumflex stents. EF 45-50%. Relatively stable.   Marland Kitchen LEFT HEART CATHETERIZATION WITH CORONARY ANGIOGRAM N/A 05/15/2014   Procedure: LEFT  HEART CATHETERIZATION WITH CORONARY ANGIOGRAM;  Surgeon: Sinclair Grooms, MD;  Location: [facility];  Service: Cardiovascular;  Laterality: N/A;  . LEFT HEART CATHETERIZATION WITH CORONARY/GRAFT ANGIOGRAM N/A 06/28/2014   Procedure: LEFT HEART CATHETERIZATION WITH Beatrix Fetters;  Surgeon: Troy Sine, MD;  Location: [facility] CATH LAB;  Service: Cardiovascular;  Laterality: N/A;  . LUMBAR PERCUTANEOUS PEDICLE SCREW 1 LEVEL N/A 11/22/2012   Procedure: LUMBAR PERCUTANEOUS PEDICLE SCREW 1 LEVEL;  Surgeon: Shanon Brow  Adah Salvage, MD;  Location: [facility];  Service: Neurosurgery;  Laterality: N/A;  Lumbar Three-Four Percutaneous Pedicle Screw, Lateral approach  . MINOR HEMORRHOIDECTOMY    . NM MYOVIEW LTD  December 2013   LOW RISK. Mmoderate region of mid to basal inferolateral scar without ischemia. Mild apical hypokinesis with an EF of 47%.  Marland Kitchen PERCUTANEOUS CORONARY STENT INTERVENTION (PCI-S)  September 2004   PCI - RCA 2 overlapping Taxus DES 2.75 mm x 32 mm and 2.75 mm x 12 mm (3.0 mm); PCI-Cx-OM1 - Taxus DES 3.0 mm x 20 mm (3.1 mm);   Marland Kitchen PERCUTANEOUS CORONARY STENT INTERVENTION (PCI-S)  12/2005   80% ISR in proximal Taxus stent in RCA -- covered proximally with Cypher DES 3.0 mm x 12 mm  . PERCUTANEOUS CORONARY STENT INTERVENTION (PCI-S) N/A 05/17/2014   Procedure: PERCUTANEOUS CORONARY STENT INTERVENTION (PCI-S);  Surgeon: Sinclair Grooms, MD;  Location: [facility] CATH LAB: PCI pRCA stent ISR - Promus DES 3.0 x 8 (3.25), OM distal stent edge - Promus DES 2.75 x 12 (3.1)  . PERIPHERAL VASCULAR CATHETERIZATION  11/03/2016   Procedure: Embolization;  Surgeon: Waynetta Sandy, MD;  Location: [facility] CV LAB;  Service: Cardiovascular;;  . POSTERIOR CERVICAL FUSION/FORAMINOTOMY N/A 05/02/2013   Procedure: POSTERIOR CERVICAL FUSION/FORAMINOTOMY CERVICAL SEVEN THORACIC-ONE;  Surgeon: Eustace Moore, MD;  Location: [place] [facility];  Service: Neurosurgery;  Laterality: N/A;  POSTERIOR CERVICAL  FUSION/FORAMINOTOMY CERVICAL SEVEN THORACIC-ONE  . SPHINCTEROTOMY N/A 06/30/2015   Procedure: SPHINCTEROTOMY;  Surgeon: Ladene Artist, MD;  Location[facility];  Service: Endoscopy;  Laterality: N/A;  . TOTAL KNEE ARTHROPLASTY Right   . TOTAL KNEE ARTHROPLASTY Left 12/20/2016   Procedure: LEFT TOTAL KNEE ARTHROPLASTY;  Surgeon: Vickey Huger, MD;  Location: [facility];  Service: Orthopedics;  Laterality: Left;  . TRANSTHORACIC ECHOCARDIOGRAM  December 2013; October 2016   a. EF 55-60%. mod LA dilation. Aortic Sclerosis;; b. EF 55-60%, Gr 1 DD, Mild-Mod AS (Peark - Mean Gradient 18-10 mmHg)  . TRANSTHORACIC ECHOCARDIOGRAM  07/2016   EF 45-50%. Inferolateral hypokinesis (correlates with scar noted on Myoview). GR 1 DD. Mild aortic stenosis. Mod LAE.  Marland Kitchen VISCERAL ANGIOGRAM  11/03/2016   Procedure: Visceral Angiogram;  Surgeon: Waynetta Sandy, MD;  Location: [place] CV LAB;  Service: Cardiovascular;;    CARDIAC CATH 04/20/2017: New - LAD now 100% prior to SP1/Diag1.  Not good PCI option.  Prox RCA to Mid RCA stents & Ost 3rd Mrg to 3rd Mrg stents, 0 %stenosed.  Ost LAD to Prox LAD lesion, 100 %stenosed - prior to SP1/Diag1 (~ new)   LIMA-LAD and is normal in caliber and anatomically normal.  There is mild left ventricular systolic dysfunction.  EF is 45-50% by visual estimate. Normal LVEDP   Current Meds  Medication Sig  . albuterol (PROVENTIL HFA;VENTOLIN HFA) 108 (90 Base) MCG/ACT inhaler Inhale 2 puffs into the lungs every 6 (six) hours as needed for wheezing or shortness of breath.  Marland Kitchen amLODipine (NORVASC) 5 MG tablet TAKE 5 MG BY MOUTH DAILY....need appointment before further refills (Patient taking differently: Take 5 mg by mouth daily. )  . aspirin EC 81 MG EC tablet Take 1 tablet (81 mg total) by mouth daily.  . Choline Fenofibrate (FENOFIBRIC ACID) 135 MG CPDR TAKE 1 CAPSULE BY MOUTH ONCE DAILY  . clopidogrel (PLAVIX) 75 MG tablet TAKE 1 TABLET BY MOUTH ONCE DAILY  .  ezetimibe (ZETIA) 10 MG tablet TAKE 1 TABLET BY MOUTH ONCE DAILY  .  glucose blood (ONETOUCH VERIO) test strip USE ONE STRIP TO CHECK GLUCOSE TWICE DAILY E11.9  . losartan (COZAAR) 100 MG tablet TAKE 1 TABLET BY MOUTH ONCE DAILY  . metFORMIN (GLUCOPHAGE) 500 MG tablet TAKE 1 TABLET BY MOUTH ONCE DAILY WITH BREAKFAST  . Multiple Vitamins-Minerals (MULTIVITAMIN WITH MINERALS) tablet Take 1 tablet by mouth daily.   . pantoprazole (PROTONIX) 40 MG tablet TAKE 1 TABLET BY MOUTH ONCE DAILY  . ranolazine (RANEXA) 500 MG 12 hr tablet Take 1 tablet (500 mg total) by mouth 2 (two) times daily.    Allergies  Allergen Reactions  . Oxycodone Shortness Of Breath and Cough  . Lisinopril Cough  . Statins Other (See Comments)    MYALGIAS   . Welchol [Colesevelam Hcl] Itching   Social History   Tobacco Use  . Smoking status: Former Smoker    Types: Cigarettes    Last attempt to quit: 10/05/1983    Years since quitting: 34.8  . Smokeless tobacco: Never Used  Substance Use Topics  . Alcohol use: Yes    Alcohol/week: 1.0 standard drinks    Types: 1 Glasses of wine per week    Comment:  4 oz. wine daily  . Drug use: No   Social History   Social History Narrative   He is married, father of two, grandfather to 6, great grandfather to two.    Not really getting much exercise now, do to his significant back and hip pain.    He does not smoke and only has an alcoholic beverage.     Family History family history includes Arthritis in his mother; Breast cancer in his sister; Diabetes in his father and sister; Heart disease in his brother, father, and sister; Hypertension in his sister; Stroke in his sister; Ulcers in his brother.  Wt Readings from Last 3 Encounters:  07/17/18 221 lb 6.4 oz (100.4 kg)  05/24/18 215 lb (97.5 kg)  04/21/18 215 lb 6.4 oz (97.7 kg)    PHYSICAL EXAM BP (!) 152/80   Pulse 66   Ht 5\' 9"  (1.753 m)   Wt 221 lb 6.4 oz (100.4 kg)   BMI 32.70 kg/m  Physical Exam    Constitutional: He is oriented to person, place, and time. He appears well-developed and well-nourished. No distress.  Mildly obese, but otherwise healthy-appearing.  Well-groomed  HENT:  Head: Normocephalic and atraumatic.  Neck: Neck supple. No hepatojugular reflux and no JVD present. Carotid bruit is not present. No thyromegaly present.  Cardiovascular: Normal rate, regular rhythm and intact distal pulses.  No extrasystoles are present. PMI is not displaced. Exam reveals decreased pulses (Mildly decreased pedal pulses). Exam reveals no gallop and no friction rub.  Murmur (2/6 SEM at RUSB radiates to carotids) heard. No carotid bruit or JVD  Pulmonary/Chest: Effort normal and breath sounds normal. No respiratory distress. He has no wheezes. He has no rales.  Distant breath sounds  Abdominal: Soft. Bowel sounds are normal. He exhibits no distension. There is no tenderness. There is no rebound.  Musculoskeletal: Normal range of motion. He exhibits edema (1-2+ bilateral lower extremity).  Neurological: He is alert and oriented to person, place, and time.  Skin: Skin is warm and dry.  No evidence of venous stasis changes  Psychiatric: He has a normal mood and affect. His behavior is normal. Judgment and thought content normal.  Nursing note and vitals reviewed.   Adult ECG Report  Rate: 66;  Rhythm: normal sinus rhythm and 1 degree AVB.  Nonspecific ST and T wave changes.  Otherwise normal axis, intervals and durations.;   Narrative Interpretation: Stable EKG   Other studies Reviewed: Additional studies/ records that were reviewed today include:  Recent Labs: Based on most recent chemistry panel showing elevated creatinine, a BMP was ordered for today.   Lab Results  Component Value Date   CHOL 185 07/04/2018   HDL 47 07/04/2018   LDLCALC 115 (H) 07/04/2018   TRIG 115 07/04/2018   CHOLHDL 3.9 07/04/2018   Lab Results  Component Value Date   CREATININE 1.99 (H) 07/04/2018   BUN 33  (H) 07/04/2018   NA 141 07/04/2018   K 5.2 07/04/2018   CL 104 07/04/2018   CO2 21 07/04/2018  --Recommendations were increase hydration.  (Drinking less beer)  Lab Results  Component Value Date   CREATININE 1.52 (H) 07/17/2018   BUN 19 07/17/2018   NA 142 07/17/2018   K 5.4 (H) 07/17/2018   CL 107 (H) 07/17/2018   CO2 21 07/17/2018    ASSESSMENT / PLAN:  Problem List Items Addressed This Visit    CAD S/P CABG '95- several PCis since, last 05/17/14 - Primary (Chronic)    More than 1 year out now from his last non-STEMI.  Probably related to upstream occlusion of the LAD jeopardizing the diagonal branch.  Not a PCI option at this point.  He is not having active angina symptoms.  Plan: We will continue Plavix without aspirin now. Continue amlodipine and Ranexa for antianginal effect as well as blood pressure. Is on modest dose of Crestor which will increase to 40mg  2 days out of the week. Not on beta-blocker because of COPD and bradycardia.      Relevant Medications   furosemide (LASIX) 20 MG tablet   Other Relevant Orders   EKG 12-Lead (Completed)   Chronic diastolic CHF (congestive heart failure) (HCC) (Chronic)    Certainly playing some role in his dyspnea as well as edema.  He had not previously been on Lasix, but now with having worsening edema, I think we will add Lasix. --Lasix 20 mg 2 days a week (Monday and Tuesday) and as needed other days for edema. Continue losartan for afterload reduction along with amlodipine. His blood pressure is little high today but usually is in the 130s to 140s at home.  As such, I am not going to make another adjustment because that would mean add another medicine versus increase amlodipine.  Since were not dealing with edema, I do not want to increase amlodipine at this point.      Relevant Medications   furosemide (LASIX) 20 MG tablet   Chronic renal insufficiency, stage III (moderate) (HCC) (Chronic)   Coronary stent restenosis with  uncertain cause: Required PCI for ISR of OM1 (Promus P 2.75 mm x 12 mm) & pRCA (Chronic)    We converted from Brilinta to Plavix back in 2017.  At this point, I think we will just keep him on maintenance dose of Plavix without aspirin.  Would not want to have any further stent occlusion.      Hyperlipidemia LDL goal <70; statin intolerant (Chronic)    He has had issues with PCSK9 but her cost but also some type of interaction with the with 1 of the 2 options.  It seems that the one he tolerates is not covered by his insurance.  Currently meeting with [facility] to adjust.  Since his LDL is 115 now when it had been less than  70 on PCSK9 inhibitor, I have asked [name], [place]- CCP to discuss options with him.  It appears that they will attempt to get him restarted on Praluent injections with samples.  This will be done while trying to reestablish payment coverage. Recheck labs in 3 to 4 months, but will have him follow back up with [facility]      Relevant Medications   furosemide (LASIX) 20 MG tablet   Hypertensive heart disease with chronic diastolic congestive heart failure (HCC) (Chronic)    Blood pressure is also being monitored by CV [facility].  If edema is better at next visit and blood pressure improved, may consider increasing amlodipine and making Lasix standing.      Relevant Medications   furosemide (LASIX) 20 MG tablet   Other Relevant Orders   EKG 12-Lead (Completed)   Mild aortic stenosis by prior echocardiogram (Chronic)    Last echo was in 2017.  Would probably be due to check an echocardiogram this year, but with everything else going on, I think we can hold off until next year after follow-up visit. Murmur does not sound to be much worse.      Relevant Medications   furosemide (LASIX) 20 MG tablet   Other Relevant Orders   EKG 12-Lead (Completed)   Obesity (BMI 30-39.9) (Chronic)    Continue to recommend increased exercise and dietary adjustment.         Overall, since doing very well from a cardiac standpoint. No further anginal symptoms.  Major concern now is slightly worsening edema.  Current medicines are reviewed at length with the patient today. (+/- concerns) None The following changes have been made: See below  Patient Instructions  Medication Instructions:   FUROSEMIDE 20 MG on Monday and  Thursday -- may use an additional tablet daily if needed.    TRY Praluent  Injection for cholesterol   If you need a refill on your cardiac medications before your next appointment, please call your pharmacy.     Lab work: [place] If you have labs (blood work) drawn today and your tests are completely normal, you will receive your results only by: Marland Kitchen MyChart Message (if you have MyChart) OR . A paper copy in the mail If you have any lab test that is abnormal or we need to change your treatment, we will call you to review the results.  Testing/Procedures: Not needed   Follow-Up: At [facility], you and your health needs are our priority.  As part of our continuing mission to provide you with exceptional heart care, we have created designated Provider Care Teams.  These Care Teams include your primary Cardiologist (physician) and Advanced Practice Providers (APPs -  Physician Assistants and Nurse Practitioners) who all work together to provide you with the care you need, when you need it. You will need a follow up appointment in 6 months.  Please call our office 2 months in advance to schedule this appointment.  You may see No primary care provider on file. or one of the following Advanced Practice Providers on your designated Care Team:   Rosaria Ferries, PA-C . Jory Sims, DNP, ANP  Any Other Special Instructions Will Be Listed Below (If Applicable).      Studies Ordered:   Orders Placed This Encounter  Procedures  . EKG 12-Lead      Glenetta Hew, M.D., M.S. Interventional Cardiologist    Pager # [phone] Phone # [phone] [address]  [place], [place] [address]

## 2018-07-17 NOTE — Patient Instructions (Signed)
Medication Instructions:   FUROSEMIDE 20 MG on Monday and  Thursday -- may use an additional tablet daily if needed.    TRY Praluent  Injection for cholesterol   If you need a refill on your cardiac medications before your next appointment, please call your pharmacy.     Lab work: [place] If you have labs (blood work) drawn today and your tests are completely normal, you will receive your results only by: Marland Kitchen MyChart Message (if you have MyChart) OR . A paper copy in the mail If you have any lab test that is abnormal or we need to change your treatment, we will call you to review the results.  Testing/Procedures: Not needed   Follow-Up: At [facility], you and your health needs are our priority.  As part of our continuing mission to provide you with exceptional heart care, we have created designated Provider Care Teams.  These Care Teams include your primary Cardiologist (physician) and Advanced Practice Providers (APPs -  Physician Assistants and Nurse Practitioners) who all work together to provide you with the care you need, when you need it. You will need a follow up appointment in 6 months.  Please call our office 2 months in advance to schedule this appointment.  You may see No primary care provider on file. or one of the following Advanced Practice Providers on your designated Care Team:   Rosaria Ferries, PA-C . Jory Sims, DNP, ANP  Any Other Special Instructions Will Be Listed Below (If Applicable).

## 2018-07-18 ENCOUNTER — Encounter: Payer: Self-pay | Admitting: Cardiology

## 2018-07-18 NOTE — Assessment & Plan Note (Addendum)
He has had issues with PCSK9 but her cost but also some type of interaction with the with 1 of the 2 options.  It seems that the one he tolerates is not covered by his insurance.  Currently meeting with [facility] to adjust.  Since his LDL is 115 now when it had been less than 70 on PCSK9 inhibitor, I have asked [name], [place] to discuss options with him.  It appears that they will attempt to get him restarted on Praluent injections with samples.  This will be done while trying to reestablish payment coverage. Recheck labs in 3 to 4 months, but will have him follow back up with [facility]

## 2018-07-18 NOTE — Assessment & Plan Note (Signed)
We converted from Brilinta to Plavix back in 2017.  At this point, I think we will just keep him on maintenance dose of Plavix without aspirin.  Would not want to have any further stent occlusion.

## 2018-07-18 NOTE — Assessment & Plan Note (Signed)
Continue to recommend increased exercise and dietary adjustment.

## 2018-07-18 NOTE — Assessment & Plan Note (Signed)
Blood pressure is also being monitored by [facility].  If edema is better at next visit and blood pressure improved, may consider increasing amlodipine and making Lasix standing.

## 2018-07-18 NOTE — Assessment & Plan Note (Addendum)
Certainly playing some role in his dyspnea as well as edema.  He had not previously been on Lasix, but now with having worsening edema, I think we will add Lasix. --Lasix 20 mg 2 days a week (Monday and Tuesday) and as needed other days for edema. Continue losartan for afterload reduction along with amlodipine. His blood pressure is little high today but usually is in the 130s to 140s at home.  As such, I am not going to make another adjustment because that would mean add another medicine versus increase amlodipine.  Since were not dealing with edema, I do not want to increase amlodipine at this point.

## 2018-07-18 NOTE — Assessment & Plan Note (Signed)
More than 1 year out now from his last non-STEMI.  Probably related to upstream occlusion of the LAD jeopardizing the diagonal branch.  Not a PCI option at this point.  He is not having active angina symptoms.  Plan: We will continue Plavix without aspirin now. Continue amlodipine and Ranexa for antianginal effect as well as blood pressure. Is on modest dose of Crestor which will increase to 40mg  2 days out of the week. Not on beta-blocker because of COPD and bradycardia.

## 2018-07-18 NOTE — Assessment & Plan Note (Signed)
Last echo was in 2017.  Would probably be due to check an echocardiogram this year, but with everything else going on, I think we can hold off until next year after follow-up visit. Murmur does not sound to be much worse.

## 2018-07-20 ENCOUNTER — Ambulatory Visit (INDEPENDENT_AMBULATORY_CARE_PROVIDER_SITE_OTHER): Payer: Medicare Other | Admitting: Family Medicine

## 2018-07-20 VITALS — BP 126/78 | HR 77 | Temp 97.9°F | Ht 70.5 in | Wt 216.0 lb

## 2018-07-20 DIAGNOSIS — I25119 Atherosclerotic heart disease of native coronary artery with unspecified angina pectoris: Secondary | ICD-10-CM

## 2018-07-20 DIAGNOSIS — Z789 Other specified health status: Secondary | ICD-10-CM

## 2018-07-20 DIAGNOSIS — Z23 Encounter for immunization: Secondary | ICD-10-CM | POA: Diagnosis not present

## 2018-07-20 DIAGNOSIS — N183 Chronic kidney disease, stage 3 unspecified: Secondary | ICD-10-CM

## 2018-07-20 DIAGNOSIS — E785 Hyperlipidemia, unspecified: Secondary | ICD-10-CM

## 2018-07-20 DIAGNOSIS — E118 Type 2 diabetes mellitus with unspecified complications: Secondary | ICD-10-CM

## 2018-07-20 LAB — POCT GLYCOSYLATED HEMOGLOBIN (HGB A1C): HEMOGLOBIN A1C: 5.7 % — AB (ref 4.0–5.6)

## 2018-07-20 NOTE — Progress Notes (Signed)
Subjective:    Patient ID: [name], male    DOB: 11-23-1935, 82 y.o.   MRN: [id number]  [name] is a 82 y.o. male who presents for follow-up of Type 2 diabetes mellitus.  He was diagnosed with diabetes several years ago with an A1c in the 7 range.  Last several A1c's have been below 6.  Presently he is taking metformin.  He continues to be followed by cardiology.  He is on multiple meds from cardiology.  He states that he is taking Ranexa once per day and did discuss this with cardiology.  He has a history of statin intolerance but presently is taking Crestor.  Patient is  checking home blood sugars.   Home blood sugar records: keeps in a book How often is blood sugars being checked: 95-105 avg QOD Current symptoms/problems include none and have been unchanged. Daily foot checks: yes  Any foot concerns: no Last eye exam: may 22 019 Exercise: walking, house work  The following portions of the patient's history were reviewed and updated as appropriate: allergies, current medications, past medical history, past social history and problem list.  ROS as in subjective above.     Objective:    Physical Exam Alert and in no distress otherwise not examined.  A1c is 5.8 Lab Review Diabetic Labs Latest Ref Rng & Units 07/17/2018 07/04/2018 05/24/2018 07/20/2017 06/20/2017  HbA1c - - - - 4.9 -  Microalbumin mg/L - - - <5.0 -  Micro/Creat Ratio - - - - <6.9 -  Chol 100 - 199 mg/dL - 185 - - 158  HDL >39 mg/dL - 47 - - 44  Calc LDL 0 - 99 mg/dL - 115(H) - - 96  Triglycerides 0 - 149 mg/dL - 115 - - 92  Creatinine 0.76 - 1.27 mg/dL 1.52(H) 1.99(H) 1.60(H) - -  GFR >60.00 mL/min - - - - -   BP/Weight 07/17/2018 05/24/2018 04/21/2018 5/64/3329 02/02/8840  Systolic BP 660 630 160 109 323  Diastolic BP 80 72 91 70 81  Wt. (Lbs) 221.4 215 215.4 218.6 218  BMI 32.7 31.75 30.91 31.37 32.19   Foot/eye exam completion dates Latest Ref Rng & Units 03/29/2018 03/29/2018  Eye Exam No  Retinopathy No Retinopathy No Retinopathy  Foot Form Completion - - -    Ladarrian  reports that he quit smoking about 34 years ago. His smoking use included cigarettes. He has never used smokeless tobacco. He reports that he drinks about 1.0 standard drinks of alcohol per week. He reports that he does not use drugs.     Assessment & Plan:    Type 2 diabetes mellitus with complication, without long-term current use of insulin (HCC) - Plan: POCT glycosylated hemoglobin (Hb A1C)  Need for influenza vaccination - Plan: Flu vaccine HIGH DOSE PF (Fluzone High dose)  Hyperlipidemia LDL goal <70; statin intolerant  Statin intolerance  Type II diabetes mellitus with complication -- CAD, AAA  Chronic renal insufficiency, stage III (moderate) ([place])    1. Rx changes: Stop metformin 2. Education: Reviewed 'ABCs' of diabetes management (respective goals in parentheses):  A1C (<7), blood pressure (<130/80), and cholesterol (LDL <100). 3. Compliance at present is estimated to be good. Efforts to improve compliance (if necessary) will be directed at Maintaining present course. 4. Follow up: 4 months His most recent A1c readings look very good and I think is reasonable.  His metformin especially with his underlying renal disease.  He will continue to check his blood  sugars and follow-up here in 4 months.

## 2018-07-20 NOTE — Patient Instructions (Signed)
Stop the Metformin and keep track of your blood sugars

## 2018-07-31 ENCOUNTER — Ambulatory Visit (INDEPENDENT_AMBULATORY_CARE_PROVIDER_SITE_OTHER): Payer: Medicare Other | Admitting: Medical

## 2018-07-31 VITALS — BP 130/80 | HR 76 | Temp 98.0°F | Resp 16 | Ht 69.0 in | Wt 219.8 lb

## 2018-07-31 DIAGNOSIS — I25119 Atherosclerotic heart disease of native coronary artery with unspecified angina pectoris: Secondary | ICD-10-CM | POA: Diagnosis not present

## 2018-07-31 DIAGNOSIS — R062 Wheezing: Secondary | ICD-10-CM

## 2018-07-31 DIAGNOSIS — R1013 Epigastric pain: Secondary | ICD-10-CM | POA: Diagnosis not present

## 2018-07-31 DIAGNOSIS — R059 Cough, unspecified: Secondary | ICD-10-CM

## 2018-07-31 DIAGNOSIS — R05 Cough: Secondary | ICD-10-CM | POA: Diagnosis not present

## 2018-07-31 DIAGNOSIS — R0600 Dyspnea, unspecified: Secondary | ICD-10-CM | POA: Diagnosis not present

## 2018-07-31 MED ORDER — AZITHROMYCIN 250 MG PO TABS: ORAL_TABLET | ORAL | 0 refills | Status: DC

## 2018-07-31 MED ORDER — FLUTICASONE FUROATE-VILANTEROL 100-25 MCG/INH IN AEPB: 1.0000 | INHALATION_SPRAY | Freq: Every day | RESPIRATORY_TRACT | 0 refills | Status: DC

## 2018-07-31 NOTE — Progress Notes (Signed)
Subjective: Chief Complaint  Patient presents with  . cough    cough, congestions, cant sleep, diarrhea X 1 week    hands numb once and awhile    Here for 5 day hx/o cough, congestion, loose stool.   No fever, no nausea, no vomiting,no body aches.  Has had some chills. No specific sick contacts.   Has some phlegm with cough.   Having loose stool 3-4 times daily the last several days.   Smoked in remote past, quit in 1980s. No other aggravating or relieving factors. No other complaint.  Past Medical History:  Diagnosis Date  . Adenomatous colon polyp   . Ankle edema    Chronic  . Asthma   . CAD S/P percutaneous coronary angioplasty 06/2003, 12/2005, 05/2014   a) '04: Staged Taxus DES PCI to RCA (2 Taxus 2.75 x 32 & 12) and Cx-OM (Taxus 3 x 20);;'07 - PCI to pRCA ISR- Cypher DES; c) 05/2014: pPCI pRCA stentISR (Promus DES 3 x 8) OM1 distal to stent (Promus DES 2.75 x 12 - 3.1)  . CAD, multiple vessel 1985   Most recent July 2018: Admitted for non-STEMI. Occluded LAD with patent LIMA (SP1 now occluded).  Patent stents in proximal and mid RCA as well as circumflex-OM 3. Known occlusion of SVG-OM. EF 45-50%  . Cholelithiasis - with cholangitis & choledocholithiasis    status post ERCP with removal of calculi and biliary stent placement.  . Chronic anemia    On iron supplement; history of positive guaiac - negative colonoscopy in 1996.; Thought to be related to hemorrhoids; status post hemorrhoidectomy  . Chronic back pain     multiple surgeries; C-spine and lumbar  . Diabetes mellitus    on oral medication  . Diverticulitis of colon 1996  . Diverticulosis   . Dyslipidemia, goal LDL below 70   . Erectile dysfunction   . Exertional dyspnea    Chronic baseline SOB with ambulation  . GERD (gastroesophageal reflux disease)   . H/O: pneumonia February 14  . Hemorrhoids   . Hiatal hernia   . History of: ST elevation myocardial infarction (STEMI) involving left circumflex coronary artery with  complication 7062   PTCA-circumflex; PCI in 1991  . Hypertension   . Moderate aortic stenosis by prior echocardiogram 07/2015   Normal LV function - EF 55-60%.. Abnormal relaxation. Mild-moderate aortic stenosis (peak/mean gradient 18/10 mmH)  . Osteoarthritis of both knees    And back; multiple back surgeries, right knee arthroplasty and left knee arthroscopic surgery x2  . Pneumonia 2016  . S/P AAA (abdominal aortic aneurysm) repair 08/30/2012   s/p EVAR  . S/P CABG x 2 1997   LIMA-LAD, SVG-OM  . Sleep apnea    pt. states he was told to return for f/u, to be fitted for Cpap, but pt. reports that he didn't follow up-no cpap used    Current Outpatient Medications on File Prior to Visit  Medication Sig Dispense Refill  . acetaminophen (TYLENOL) 500 MG tablet Take 1,000 mg by mouth every 6 (six) hours as needed for mild pain, moderate pain or headache. Reported on 03/16/2016    . albuterol (PROVENTIL HFA;VENTOLIN HFA) 108 (90 Base) MCG/ACT inhaler Inhale 2 puffs into the lungs every 6 (six) hours as needed for wheezing or shortness of breath. 1 Inhaler 0  . Alirocumab (PRALUENT [employer]) Inject into the skin every 14 (fourteen) days.    Marland Kitchen amLODipine (NORVASC) 5 MG tablet TAKE 5 MG BY MOUTH DAILY....need appointment before  further refills (Patient taking differently: Take 5 mg by mouth daily. ) 90 tablet 0  . aspirin EC 81 MG EC tablet Take 1 tablet (81 mg total) by mouth daily. 30 tablet 11  . Choline Fenofibrate (FENOFIBRIC ACID) 135 MG CPDR TAKE 1 CAPSULE BY MOUTH ONCE DAILY 90 capsule 3  . clopidogrel (PLAVIX) 75 MG tablet TAKE 1 TABLET BY MOUTH ONCE DAILY 90 tablet 1  . ezetimibe (ZETIA) 10 MG tablet TAKE 1 TABLET BY MOUTH ONCE DAILY 90 tablet 3  . furosemide (LASIX) 20 MG tablet Take 20 mg ( 1 tablet) by mouth every Monday and Thursday, may take an additional dose daily if needed. 90 tablet 3  . glucose blood (ONETOUCH VERIO) test strip USE ONE STRIP TO CHECK GLUCOSE TWICE DAILY E11.9 180 each  2  . Iron-Folic Acid-Vit N82 (IRON FORMULA PO) Take 65 mg by mouth 1 day or 1 dose.    . losartan (COZAAR) 100 MG tablet TAKE 1 TABLET BY MOUTH ONCE DAILY 90 tablet 1  . metFORMIN (GLUCOPHAGE) 500 MG tablet TAKE 1 TABLET BY MOUTH ONCE DAILY WITH BREAKFAST (Patient taking differently: Take 500 mg by mouth every other day. ) 90 tablet 1  . Multiple Vitamins-Minerals (MULTIVITAMIN WITH MINERALS) tablet Take 1 tablet by mouth daily.     . pantoprazole (PROTONIX) 40 MG tablet TAKE 1 TABLET BY MOUTH ONCE DAILY 90 tablet 1  . ranolazine (RANEXA) 500 MG 12 hr tablet Take 1 tablet (500 mg total) by mouth 2 (two) times daily. 60 tablet 8  . sildenafil (REVATIO) 20 MG tablet TAKE 2-5 TABLETS BY MOUTH AS NEEDED FOR SEXUAL ACTIVITY 50 tablet 5  . budesonide-formoterol (SYMBICORT) 80-4.5 MCG/ACT inhaler Inhale 2 puffs into the lungs 2 (two) times daily. (Patient not taking: Reported on 04/21/2018) 1 Inhaler 12  . rosuvastatin (CRESTOR) 20 MG tablet Take 1 tablet (20 mg total) by mouth daily. (Patient not taking: Reported on 01/06/2018) 90 tablet 3   No current facility-administered medications on file prior to visit.    ROS as in subjective    Objective: BP 130/80   Pulse 76   Temp 98 F (36.7 C) (Oral)   Resp 16   Ht 5\' 9"  (1.753 m)   Wt 219 lb 12.8 oz (99.7 kg)   SpO2 96%   BMI 32.46 kg/m   Wt Readings from Last 3 Encounters:  07/31/18 219 lb 12.8 oz (99.7 kg)  07/20/18 216 lb (98 kg)  07/17/18 221 lb 6.4 oz (100.4 kg)   General appearance: Alert, WD/WN, no distress                             Skin: warm, no rash, no diaphoresis                           Head: no sinus tenderness                            Eyes: conjunctiva normal, corneas clear, PERRLA                            Ears: pearly TMs, external ear canals normal                          Nose: septum midline, turbinates swollen,  with erythema and clear discharge             Mouth/throat: MMM, tongue normal, mild pharyngeal  erythema                           Neck: supple, no adenopathy, no thyromegaly, non tender                          Heart: RRR, normal S1, S2, no murmurs                         Lungs: +bronchial breath sounds, +scattered rhonchi, no wheezes, no rales                Extremities: no edema, non tender        Assessment: Encounter Diagnoses  Name Primary?  Marland Kitchen Dyspnea, unspecified type Yes  . Cough   . Wheezing   . Dyspepsia      Plan: Discussed symptoms and concerns.  PFT reviewed today.  Begin Z-Pak.  Gave sample of Breo to use this next week, discussed proper use, can continue Mucinex.  Follow-up if not much improved within a week  Travarius was seen today for cough.  Diagnoses and all orders for this visit:  Dyspnea, unspecified type  Cough  Wheezing  Dyspepsia Comments: error Orders: -     Spirometry with Graph  Other orders -     azithromycin (ZITHROMAX) 250 MG tablet; 2 tablets day 1, then 1 tablet days 2-4

## 2018-08-01 ENCOUNTER — Telehealth: Payer: Self-pay | Admitting: Pharmacist

## 2018-08-01 NOTE — Telephone Encounter (Signed)
Patient reported upper respiratory infection after Praluent short 07/20/2018.  Refuses re-challenge with Repatha or Praluent. Will not agree on taking rosuvastatin either.

## 2018-08-21 ENCOUNTER — Other Ambulatory Visit: Payer: Self-pay | Admitting: Cardiology

## 2018-09-05 ENCOUNTER — Ambulatory Visit (INDEPENDENT_AMBULATORY_CARE_PROVIDER_SITE_OTHER): Payer: Medicare Other | Admitting: Family Medicine

## 2018-09-05 ENCOUNTER — Encounter: Payer: Self-pay | Admitting: Family Medicine

## 2018-09-05 ENCOUNTER — Ambulatory Visit (INDEPENDENT_AMBULATORY_CARE_PROVIDER_SITE_OTHER): Payer: Medicare Other | Admitting: Orthopaedic Surgery

## 2018-09-05 ENCOUNTER — Encounter (INDEPENDENT_AMBULATORY_CARE_PROVIDER_SITE_OTHER): Payer: Self-pay | Admitting: Orthopaedic Surgery

## 2018-09-05 VITALS — BP 150/90 | HR 71 | Temp 97.5°F | Wt 222.0 lb

## 2018-09-05 VITALS — BP 126/72 | HR 73 | Ht 69.0 in | Wt 220.0 lb

## 2018-09-05 DIAGNOSIS — I25119 Atherosclerotic heart disease of native coronary artery with unspecified angina pectoris: Secondary | ICD-10-CM

## 2018-09-05 DIAGNOSIS — S46211A Strain of muscle, fascia and tendon of other parts of biceps, right arm, initial encounter: Secondary | ICD-10-CM

## 2018-09-05 NOTE — Progress Notes (Signed)
Office Visit Note   Patient: [name]           Date of Birth: Sep 17, 1936           MRN: [id number] Visit Date: 09/05/2018              Requested by: Denita Lung, MD [place], [place] [address] PCP: Denita Lung, MD   Assessment & Plan: Visit Diagnoses:  1. Rupture of right biceps tendon, initial encounter     Plan: Rupture of right proximal long head biceps tendon times 5 days.  Monitor response over the next 10 to 14 days to consider MRI scan.  If the only injury was to the tendon he will not require any further intervention or diagnostic studies.  Follow-Up Instructions: Return in about 2 weeks (around 09/19/2018).   Orders:  No orders of the defined types were placed in this encounter.  No orders of the defined types were placed in this encounter.     Procedures: No procedures performed   Clinical Data: No additional findings.   Subjective: Chief Complaint  Patient presents with  . Right Upper Arm - Pain  . Arm Pain    swelling, injury picking up a trail, using otc meds for the pain ,ice   Mr. Millirons is 82 years old and visited the office accompanied by his wife.  He saw Dr.Lalonde this morning for evaluation of right arm pain.  He sustained an injury last Friday lifting a heavy object with acute onset of pain and deformity of the biceps muscle.  He has not had any trouble raising his arm over his head.  He was not having any problem with his arm prior to that.  Not had any numbness or tingling.  HPI  Review of Systems  Neurological: Positive for weakness.     Objective: Vital Signs: BP 126/72   Pulse 73   Ht 5\' 9"  (1.753 m)   Wt 220 lb (99.8 kg)   BMI 32.49 kg/m   Physical Exam  Constitutional: He is oriented to person, place, and time. He appears well-developed and well-nourished.  HENT:  Mouth/Throat: Oropharynx is clear and moist.  Eyes: Pupils are equal, round, and reactive to light. EOM are normal.    Pulmonary/Chest: Effort normal.  Neurological: He is alert and oriented to person, place, and time.  Skin: Skin is warm and dry.  Psychiatric: He has a normal mood and affect. His behavior is normal.    Ortho Exam awake alert and oriented x3.  Comfortable sitting.  Obvious rupture of long head of biceps tendon with distal insertion of biceps muscle with some edema.  No ecchymosis.  Able to quickly raise his arm over his head.  Appears to have good strength with internal/external rotation.  Neurovascular exam intact.  Specialty Comments:  No specialty comments available.  Imaging: No results found.   PMFS History: Patient Active Problem List   Diagnosis Date Noted  . Senile purpura ([place]) 07/20/2017  . S/P total knee replacement 12/20/2016  . Endoleak post (EVAR) endovascular aneurysm repair ([place]) 03/16/2016  . Mild aortic stenosis by prior echocardiogram 02/02/2016  . Statin intolerance 09/30/2015  . Calculus of bile duct with obstruction and without cholangitis or cholecystitis   . Obstruction of biliary stent   . Presence of drug coated stent in right coronary artery and circumflex coronary artery 01/08/2015  . Chronic renal insufficiency, stage III (moderate) ([place]) 06/06/2014  . Coronary stent restenosis with  uncertain cause: Required PCI for ISR of OM1 (Promus P 2.75 mm x 12 mm) & pRCA 05/17/2014  . Obesity (BMI 30-39.9) 06/28/2013  . Encounter for long-term (current) use of medications 06/28/2013  . ACE-inhibitor cough 04/16/2013  . Chronic diastolic CHF (congestive heart failure) ([place]) 11/24/2012  . Type II diabetes mellitus with complication -- CAD, AAA 03/04/2011    Class: Diagnosis of  . Hypertensive heart disease with chronic diastolic congestive heart failure ([place]) 03/04/2011  . Hyperlipidemia LDL goal <70; statin intolerant 03/04/2011    Class: Diagnosis of  . ED (erectile dysfunction) of organic origin 03/04/2011  . GERD (gastroesophageal reflux disease) 03/04/2011   . S/P CABG x 2: 1997. SVG-OM (known to be occluded), LIMA-LAD 06/28/1996  . History of ST elevation myocardial infarction (STEMI) of inferolateral wall (PTCA - 100% large lateral OM) 06/28/1996    Class: History of  . CAD S/P CABG '95- several PCis since, last 05/17/14 06/28/1994    Class: History of   Past Medical History:  Diagnosis Date  . Adenomatous colon polyp   . Ankle edema    Chronic  . Asthma   . CAD S/P percutaneous coronary angioplasty 06/2003, 12/2005, 05/2014   a) '04: Staged Taxus DES PCI to RCA (2 Taxus 2.75 x 32 & 12) and Cx-OM (Taxus 3 x 20);;'07 - PCI to pRCA ISR- Cypher DES; c) 05/2014: pPCI pRCA stentISR (Promus DES 3 x 8) OM1 distal to stent (Promus DES 2.75 x 12 - 3.1)  . CAD, multiple vessel 1985   Most recent July 2018: Admitted for non-STEMI. Occluded LAD with patent LIMA (SP1 now occluded).  Patent stents in proximal and mid RCA as well as circumflex-OM 3. Known occlusion of SVG-OM. EF 45-50%  . Cholelithiasis - with cholangitis & choledocholithiasis    status post ERCP with removal of calculi and biliary stent placement.  . Chronic anemia    On iron supplement; history of positive guaiac - negative colonoscopy in 1996.; Thought to be related to hemorrhoids; status post hemorrhoidectomy  . Chronic back pain     multiple surgeries; C-spine and lumbar  . Diabetes mellitus    on oral medication  . Diverticulitis of colon 1996  . Diverticulosis   . Dyslipidemia, goal LDL below 70   . Erectile dysfunction   . Exertional dyspnea    Chronic baseline SOB with ambulation  . GERD (gastroesophageal reflux disease)   . H/O: pneumonia February 14  . Hemorrhoids   . Hiatal hernia   . History of: ST elevation myocardial infarction (STEMI) involving left circumflex coronary artery with complication 5852   PTCA-circumflex; PCI in 1991  . Hypertension   . Moderate aortic stenosis by prior echocardiogram 07/2015   Normal LV function - EF 55-60%.. Abnormal relaxation.  Mild-moderate aortic stenosis (peak/mean gradient 18/10 mmH)  . Osteoarthritis of both knees    And back; multiple back surgeries, right knee arthroplasty and left knee arthroscopic surgery x2  . Pneumonia 2016  . S/P AAA (abdominal aortic aneurysm) repair 08/30/2012   s/p EVAR  . S/P CABG x 2 1997   LIMA-LAD, SVG-OM  . Sleep apnea    pt. states he was told to return for f/u, to be fitted for Cpap, but pt. reports that he didn't follow up-no cpap used    Family History  Problem Relation Age of Onset  . Arthritis Mother   . Diabetes Father   . Heart disease Father   . Stroke Sister   . Hypertension  Sister   . Heart disease Sister   . Diabetes Sister   . Breast cancer Sister   . Heart disease Brother   . Ulcers Brother   . Colon cancer Neg Hx     Past Surgical History:  Procedure Laterality Date  . Abdominal and Lower Extremity Arterial Ultrasound  08/23/2012; 10/10/2013   Normal ABIs. Nonocclusive lower extremity disease. 4.2 cm x 4.3 cm infrarenal AAA;; 4.4 cm x 4.3 cm (essentially stable)   . ABDOMINAL AORTIC ENDOVASCULAR STENT GRAFT N/A 08/13/2015   Procedure: ABDOMINAL AORTIC ENDOVASCULAR STENT GRAFT;  Surgeon: Mal Misty, MD;  Location: [facility];  Service: Vascular;  Laterality: N/A;  . Anterior cervical plating  04/23/10   At C4-5 and a C6-7 utilizing two separate Biomet MaxAn plates.  . ANTERIOR LAT LUMBAR FUSION Left 11/22/2012   Procedure: ANTERIOR LATERAL LUMBAR FUSION 1 LEVEL;  Surgeon: Eustace Moore, MD;  Location: [facility];  Service: Neurosurgery;  Laterality: Left;  Anterior Lateral Lumbar Fusion Lumbar Three-Four  . BACK SURGERY  1979 & x 10   pt. remarks, "I have had about 10 back surgeries"  . BILIARY STENT PLACEMENT N/A 07/03/2014   Procedure: BILIARY STENT PLACEMENT;  Surgeon: Inda Castle, MD;  Location: [facility];  Service: Endoscopy;  Laterality: N/A;  . CARDIAC CATHETERIZATION  September 2004   None Occluded vein graft to OM; diffuse RCA disease in  the mid vessel, 80% circumflex-OM stenosis; follow on AV groove circumflex with sequential 90% stenoses and intervening saccular dilation   . CARDIAC CATHETERIZATION  March 2010   4 abnormal Myoview showing apical thinning (possibly due to apical LAD 95%) : 100% Occluded LAD after SV1, distal LAD grafted via LIMA -apical 95% . Cx -OM1 w/patent stent extending into OM 1 . Follow on Cx - 70-80% - non-amenable PCI. RCA widely patent 3 overlapping stents in mRCA w/less than 40% stenosisin RPL; SVG-OM known occluded   . CATARACT EXTRACTION    . Cervical arthrodesis  04/23/10   Anterior cervical arthrodesis, C4-5, C6-7 utilizing 7-mm PEEK interbody cage packed with local autograft & Antifuse putty at C4-5 & an 8-mm cage at C6-7.  Marland Kitchen CERVICAL DISCECTOMY  04/23/10   Decompressive anterior carvical diskectomy. C4-5, C6-7  . CHOLECYSTECTOMY N/A 05/23/2015   Procedure: LAPAROSCOPIC CHOLECYSTECTOMY WITH INTRAOPERATIVE CHOLANGIOGRAM;  Surgeon: Alphonsa Overall, MD;  Location: [facility];  Service: General;  Laterality: N/A;  . [place]  . CORONARY ANGIOPLASTY  1985,1991,15   1985 lateral STEMI Circumflex PTCA;   . CORONARY ARTERY BYPASS GRAFT  1997   LIMA-LAD, SVG-OM (SVG known to be occluded prior to 2004)  . ERCP N/A 07/03/2014   Procedure: ENDOSCOPIC RETROGRADE CHOLANGIOPANCREATOGRAPHY (ERCP);  Surgeon: Inda Castle, MD;  Location: [facility];  Service: Endoscopy;  Laterality: N/A;  . ERCP N/A 07/05/2014   Procedure: ENDOSCOPIC RETROGRADE CHOLANGIOPANCREATOGRAPHY (ERCP);  Surgeon: Inda Castle, MD;  Location: [facility];  Service: Endoscopy;  Laterality: N/A;  . ERCP N/A 06/30/2015   Procedure: ENDOSCOPIC RETROGRADE CHOLANGIOPANCREATOGRAPHY (ERCP);  Surgeon: Ladene Artist, MD;  Location: [facility];  Service: Endoscopy;  Laterality: N/A;  . EYE SURGERY    . IR ANGIOGRAM EXTREMITY LEFT  01/18/2017  . IR ANGIOGRAM PELVIS SELECTIVE OR SUPRASELECTIVE  01/18/2017  . IR ANGIOGRAM SELECTIVE EACH  ADDITIONAL VESSEL  01/18/2017  . IR AORTAGRAM ABDOMINAL SERIALOGRAM  01/18/2017  . IR EMBO ARTERIAL NOT HEMORR HEMANG INC GUIDE ROADMAPPING  01/18/2017  . IR RADIOLOGIST EVAL & MGMT  01/04/2017  . IR RADIOLOGIST EVAL & MGMT  10/26/2017  . IR RADIOLOGIST EVAL & MGMT  05/24/2018  . IR US GUIDE VASC ACCESS RIGHT  01/18/2017  . KNEE ARTHROSCOPY Left    x 2  . LEFT HEART CATH AND CORS/GRAFTS ANGIOGRAPHY N/A 04/20/2017   Procedure: Left Heart Cath and Cors/Grafts Angiography;  Surgeon: Lorretta Harp, MD;  Location: [facility] INVASIVE CV LAB;  LAD now occluded prior to SP1.LIMA-LAD. Patent RCA and circumflex stents. EF 45-50%. Relatively stable.   Marland Kitchen LEFT HEART CATHETERIZATION WITH CORONARY ANGIOGRAM N/A 05/15/2014   Procedure: LEFT HEART CATHETERIZATION WITH CORONARY ANGIOGRAM;  Surgeon: Sinclair Grooms, MD;  Location: [facility] CATH LAB;  Service: Cardiovascular;  Laterality: N/A;  . LEFT HEART CATHETERIZATION WITH CORONARY/GRAFT ANGIOGRAM N/A 06/28/2014   Procedure: LEFT HEART CATHETERIZATION WITH Beatrix Fetters;  Surgeon: Troy Sine, MD;  Location[facility] CATH LAB;  Service: Cardiovascular;  Laterality: N/A;  . LUMBAR PERCUTANEOUS PEDICLE SCREW 1 LEVEL N/A 11/22/2012   Procedure: LUMBAR PERCUTANEOUS PEDICLE SCREW 1 LEVEL;  Surgeon: Eustace Moore, MD;  Location: [facility];  Service: Neurosurgery;  Laterality: N/A;  Lumbar Three-Four Percutaneous Pedicle Screw, Lateral approach  . MINOR HEMORRHOIDECTOMY    . NM MYOVIEW LTD  December 2013   LOW RISK. Mmoderate region of mid to basal inferolateral scar without ischemia. Mild apical hypokinesis with an EF of 47%.  Marland Kitchen PERCUTANEOUS CORONARY STENT INTERVENTION (PCI-S)  September 2004   PCI - RCA 2 overlapping Taxus DES 2.75 mm x 32 mm and 2.75 mm x 12 mm (3.0 mm); PCI-Cx-OM1 - Taxus DES 3.0 mm x 20 mm (3.1 mm);   Marland Kitchen PERCUTANEOUS CORONARY STENT INTERVENTION (PCI-S)  12/2005   80% ISR in proximal Taxus stent in RCA -- covered proximally with Cypher DES 3.0 mm x 12  mm  . PERCUTANEOUS CORONARY STENT INTERVENTION (PCI-S) N/A 05/17/2014   Procedure: PERCUTANEOUS CORONARY STENT INTERVENTION (PCI-S);  Surgeon: Sinclair Grooms, MD;  Location[facility] LAB: PCI pRCA stent ISR - Promus DES 3.0 x 8 (3.25), OM distal stent edge - Promus DES 2.75 x 12 (3.1)  . PERIPHERAL VASCULAR CATHETERIZATION  11/03/2016   Procedure: Embolization;  Surgeon: Waynetta Sandy, MD;  Location: [facility] CV LAB;  Service: Cardiovascular;;  . POSTERIOR CERVICAL FUSION/FORAMINOTOMY N/A 05/02/2013   Procedure: POSTERIOR CERVICAL FUSION/FORAMINOTOMY CERVICAL SEVEN THORACIC-ONE;  Surgeon: Eustace Moore, MD;  Location: [facility];  Service: Neurosurgery;  Laterality: N/A;  POSTERIOR CERVICAL FUSION/FORAMINOTOMY CERVICAL SEVEN THORACIC-ONE  . SPHINCTEROTOMY N/A 06/30/2015   Procedure: SPHINCTEROTOMY;  Surgeon: Ladene Artist, MD;  Location: [facility];  Service: Endoscopy;  Laterality: N/A;  . TOTAL KNEE ARTHROPLASTY Right   . TOTAL KNEE ARTHROPLASTY Left 12/20/2016   Procedure: LEFT TOTAL KNEE ARTHROPLASTY;  Surgeon: Vickey Huger, MD;  Location: [facility];  Service: Orthopedics;  Laterality: Left;  . TRANSTHORACIC ECHOCARDIOGRAM  December 2013; October 2016   a. EF 55-60%. mod LA dilation. Aortic Sclerosis;; b. EF 55-60%, Gr 1 DD, Mild-Mod AS (Peark - Mean Gradient 18-10 mmHg)  . TRANSTHORACIC ECHOCARDIOGRAM  07/2016   EF 45-50%. Inferolateral hypokinesis (correlates with scar noted on Myoview). GR 1 DD. Mild aortic stenosis. Mod LAE.  Marland Kitchen VISCERAL ANGIOGRAM  11/03/2016   Procedure: Visceral Angiogram;  Surgeon: Waynetta Sandy, MD;  Location: [facility] CV LAB;  Service: Cardiovascular;;   Social History   Occupational History  . Not on file  Tobacco Use  . Smoking status: Former Smoker  Types: Cigarettes    Last attempt to quit: 10/05/1983    Years since quitting: 34.9  . Smokeless tobacco: Never Used  Substance and Sexual Activity  . Alcohol use: Yes     Alcohol/week: 1.0 standard drinks    Types: 1 Glasses of wine per week    Comment:  4 oz. wine daily  . Drug use: No  . Sexual activity: Not Currently

## 2018-09-05 NOTE — Progress Notes (Signed)
   Subjective:    Patient ID: [name], male    DOB: Feb 21, 1936, 82 y.o.   MRN: [id number]  HPI Friday he was trying to lift the tongue of a trailer and felt pain and swelling in his right biceps.  Review of Systems     Objective:   Physical Exam Alert and in no distress.  Obvious deformity noted in the right biceps.  Flexion of the arm shoulder flaccid biceps.  Elbow shows full motion without pain or swelling.       Assessment & Plan:  Rupture of biceps tendon, right, initial encounter - Plan: Ambulatory referral to Orthopedic Surgery Will be seen later today for further evaluation and treatment.

## 2018-09-18 ENCOUNTER — Other Ambulatory Visit: Payer: Self-pay | Admitting: Cardiology

## 2018-09-20 ENCOUNTER — Encounter (INDEPENDENT_AMBULATORY_CARE_PROVIDER_SITE_OTHER): Payer: Self-pay | Admitting: Orthopaedic Surgery

## 2018-09-20 ENCOUNTER — Ambulatory Visit (INDEPENDENT_AMBULATORY_CARE_PROVIDER_SITE_OTHER): Payer: Medicare Other | Admitting: Orthopaedic Surgery

## 2018-09-20 DIAGNOSIS — M25511 Pain in right shoulder: Secondary | ICD-10-CM

## 2018-09-20 DIAGNOSIS — I25119 Atherosclerotic heart disease of native coronary artery with unspecified angina pectoris: Secondary | ICD-10-CM

## 2018-09-20 NOTE — Progress Notes (Signed)
Office Visit Note   Patient: MARGARET STAGGS           Date of Birth: 04-13-1936           MRN: [id number] Visit Date: 09/20/2018              Requested by: Denita Lung, MD [place], [place] [address] PCP: Denita Lung, MD   Assessment & Plan: Visit Diagnoses:  1. Acute pain of right shoulder     Plan: Several week status post rupture of long head biceps tendon right shoulder.  Possibly has rotator cuff tear.  With persistent pain will order MRI scan  Follow-Up Instructions: Return after MRI right shoulder.   Orders:  Orders Placed This Encounter  Procedures  . MR SHOULDER RIGHT WO CONTRAST   No orders of the defined types were placed in this encounter.     Procedures: No procedures performed   Clinical Data: No additional findings.   Subjective: Chief Complaint  Patient presents with  . Right Upper Arm - Pain  . Arm Pain    Right biceps tendon, muscle spasms, numbness, paink wears brace, picking up trailer x 2 weeks, difficulty wirth range of motion, twisting is painful, Tylenol helps, no biceps tendon, diabetic  Mr. Goede relates that he is feeling better but still having some pain in the shoulder and in the biceps muscle.  Still having some discomfort with certain kinds of activities particularly overhead motion.  No numbness or tingling  HPI  Review of Systems  Constitutional: Negative for fatigue.  HENT: Negative for trouble swallowing.   Eyes: Negative for pain.  Respiratory: Positive for shortness of breath.   Cardiovascular: Positive for leg swelling.  Gastrointestinal: Negative for constipation.  Endocrine: Negative for cold intolerance.  Genitourinary: Negative for difficulty urinating.  Musculoskeletal: Positive for joint swelling.  Skin: Negative for rash.  Allergic/Immunologic: Negative for food allergies.  Neurological: Positive for weakness and numbness.  Hematological: Bruises/bleeds easily.    Psychiatric/Behavioral: Positive for sleep disturbance.     Objective: Vital Signs: BP (!) 141/79 (BP Location: Left Arm, Patient Position: Sitting, Cuff Size: Normal)   Pulse 80   Resp 16   Ht 5' 8.5" (1.74 m)   Wt 215 lb (97.5 kg)   BMI 32.22 kg/m   Physical Exam Constitutional:      Appearance: He is well-developed.  Eyes:     Pupils: Pupils are equal, round, and reactive to light.  Pulmonary:     Effort: Pulmonary effort is normal.  Skin:    General: Skin is warm and dry.  Neurological:     Mental Status: He is alert and oriented to person, place, and time.  Psychiatric:        Behavior: Behavior normal.     Ortho Exam awake alert and oriented x3.  Comfortable sitting.  Right shoulder exam reveals minimally positive impingement.  Positive empty can testing.  Able to place right arm over his head without much pain.  Biceps muscle was in the distal position and less indurated.  Ecchymosis has resolved.  Good grip and good release.  Specialty Comments:  No specialty comments available.  Imaging: No results found.   PMFS History: Patient Active Problem List   Diagnosis Date Noted  . Senile purpura ([place]) 07/20/2017  . S/P total knee replacement 12/20/2016  . Endoleak post (EVAR) endovascular aneurysm repair ([place]) 03/16/2016  . Mild aortic stenosis by prior echocardiogram 02/02/2016  . Statin intolerance 09/30/2015  .  Calculus of bile duct with obstruction and without cholangitis or cholecystitis   . Obstruction of biliary stent   . Presence of drug coated stent in right coronary artery and circumflex coronary artery 01/08/2015  . Chronic renal insufficiency, stage III (moderate) ([place]) 06/06/2014  . Coronary stent restenosis with uncertain cause: Required PCI for ISR of OM1 (Promus P 2.75 mm x 12 mm) & pRCA 05/17/2014  . Obesity (BMI 30-39.9) 06/28/2013  . Encounter for long-term (current) use of medications 06/28/2013  . ACE-inhibitor cough 04/16/2013  . Chronic  diastolic CHF (congestive heart failure) ([place]) 11/24/2012  . Type II diabetes mellitus with complication -- CAD, AAA 03/04/2011    Class: Diagnosis of  . Hypertensive heart disease with chronic diastolic congestive heart failure ([place]) 03/04/2011  . Hyperlipidemia LDL goal <70; statin intolerant 03/04/2011    Class: Diagnosis of  . ED (erectile dysfunction) of organic origin 03/04/2011  . GERD (gastroesophageal reflux disease) 03/04/2011  . S/P CABG x 2: 1997. SVG-OM (known to be occluded), LIMA-LAD 06/28/1996  . History of ST elevation myocardial infarction (STEMI) of inferolateral wall (PTCA - 100% large lateral OM) 06/28/1996    Class: History of  . CAD S/P CABG '95- several PCis since, last 05/17/14 06/28/1994    Class: History of   Past Medical History:  Diagnosis Date  . Adenomatous colon polyp   . Ankle edema    Chronic  . Asthma   . CAD S/P percutaneous coronary angioplasty 06/2003, 12/2005, 05/2014   a) '04: Staged Taxus DES PCI to RCA (2 Taxus 2.75 x 32 & 12) and Cx-OM (Taxus 3 x 20);;'07 - PCI to pRCA ISR- Cypher DES; c) 05/2014: pPCI pRCA stentISR (Promus DES 3 x 8) OM1 distal to stent (Promus DES 2.75 x 12 - 3.1)  . CAD, multiple vessel 1985   Most recent July 2018: Admitted for non-STEMI. Occluded LAD with patent LIMA (SP1 now occluded).  Patent stents in proximal and mid RCA as well as circumflex-OM 3. Known occlusion of SVG-OM. EF 45-50%  . Cholelithiasis - with cholangitis & choledocholithiasis    status post ERCP with removal of calculi and biliary stent placement.  . Chronic anemia    On iron supplement; history of positive guaiac - negative colonoscopy in 1996.; Thought to be related to hemorrhoids; status post hemorrhoidectomy  . Chronic back pain     multiple surgeries; C-spine and lumbar  . Diabetes mellitus    on oral medication  . Diverticulitis of colon 1996  . Diverticulosis   . Dyslipidemia, goal LDL below 70   . Erectile dysfunction   . Exertional dyspnea      Chronic baseline SOB with ambulation  . GERD (gastroesophageal reflux disease)   . H/O: pneumonia February 14  . Hemorrhoids   . Hiatal hernia   . History of: ST elevation myocardial infarction (STEMI) involving left circumflex coronary artery with complication 7824   PTCA-circumflex; PCI in 1991  . Hypertension   . Moderate aortic stenosis by prior echocardiogram 07/2015   Normal LV function - EF 55-60%.. Abnormal relaxation. Mild-moderate aortic stenosis (peak/mean gradient 18/10 mmH)  . Osteoarthritis of both knees    And back; multiple back surgeries, right knee arthroplasty and left knee arthroscopic surgery x2  . Pneumonia 2016  . S/P AAA (abdominal aortic aneurysm) repair 08/30/2012   s/p EVAR  . S/P CABG x 2 1997   LIMA-LAD, SVG-OM  . Sleep apnea    pt. states he was told to return  for f/u, to be fitted for Cpap, but pt. reports that he didn't follow up-no cpap used    Family History  Problem Relation Age of Onset  . Arthritis Mother   . Diabetes Father   . Heart disease Father   . Stroke Sister   . Hypertension Sister   . Heart disease Sister   . Diabetes Sister   . Breast cancer Sister   . Heart disease Brother   . Ulcers Brother   . Colon cancer Neg Hx     Past Surgical History:  Procedure Laterality Date  . Abdominal and Lower Extremity Arterial Ultrasound  08/23/2012; 10/10/2013   Normal ABIs. Nonocclusive lower extremity disease. 4.2 cm x 4.3 cm infrarenal AAA;; 4.4 cm x 4.3 cm (essentially stable)   . ABDOMINAL AORTIC ENDOVASCULAR STENT GRAFT N/A 08/13/2015   Procedure: ABDOMINAL AORTIC ENDOVASCULAR STENT GRAFT;  Surgeon: Mal Misty, MD;  Location: [place] [facility];  Service: Vascular;  Laterality: N/A;  . Anterior cervical plating  04/23/10   At C4-5 and a C6-7 utilizing two separate Biomet MaxAn plates.  . ANTERIOR LAT LUMBAR FUSION Left 11/22/2012   Procedure: ANTERIOR LATERAL LUMBAR FUSION 1 LEVEL;  Surgeon: Eustace Moore, MD;  Location: [place] [facility];  Service:  Neurosurgery;  Laterality: Left;  Anterior Lateral Lumbar Fusion Lumbar Three-Four  . BACK SURGERY  1979 & x 10   pt. remarks, "I have had about 10 back surgeries"  . BILIARY STENT PLACEMENT N/A 07/03/2014   Procedure: BILIARY STENT PLACEMENT;  Surgeon: Inda Castle, MD;  Location: [place] [facility];  Service: Endoscopy;  Laterality: N/A;  . CARDIAC CATHETERIZATION  September 2004   None Occluded vein graft to OM; diffuse RCA disease in the mid vessel, 80% circumflex-OM stenosis; follow on AV groove circumflex with sequential 90% stenoses and intervening saccular dilation   . CARDIAC CATHETERIZATION  March 2010   4 abnormal Myoview showing apical thinning (possibly due to apical LAD 95%) : 100% Occluded LAD after SV1, distal LAD grafted via LIMA -apical 95% . Cx -OM1 w/patent stent extending into OM 1 . Follow on Cx - 70-80% - non-amenable PCI. RCA widely patent 3 overlapping stents in mRCA w/less than 40% stenosisin RPL; SVG-OM known occluded   . CATARACT EXTRACTION    . Cervical arthrodesis  04/23/10   Anterior cervical arthrodesis, C4-5, C6-7 utilizing 7-mm PEEK interbody cage packed with local autograft & Antifuse putty at C4-5 & an 8-mm cage at C6-7.  Marland Kitchen CERVICAL DISCECTOMY  04/23/10   Decompressive anterior carvical diskectomy. C4-5, C6-7  . CHOLECYSTECTOMY N/A 05/23/2015   Procedure: LAPAROSCOPIC CHOLECYSTECTOMY WITH INTRAOPERATIVE CHOLANGIOGRAM;  Surgeon: Alphonsa Overall, MD;  Location: [facility];  Service: General;  Laterality: N/A;  . [place]  . CORONARY ANGIOPLASTY  1985,1991,15   1985 lateral STEMI Circumflex PTCA;   . CORONARY ARTERY BYPASS GRAFT  1997   LIMA-LAD, SVG-OM (SVG known to be occluded prior to 2004)  . ERCP N/A 07/03/2014   Procedure: ENDOSCOPIC RETROGRADE CHOLANGIOPANCREATOGRAPHY (ERCP);  Surgeon: Inda Castle, MD;  Location: [place];  Service: Endoscopy;  Laterality: N/A;  . ERCP N/A 07/05/2014   Procedure: ENDOSCOPIC RETROGRADE CHOLANGIOPANCREATOGRAPHY (ERCP);   Surgeon: Inda Castle, MD;  Location: [place];  Service: Endoscopy;  Laterality: N/A;  . ERCP N/A 06/30/2015   Procedure: ENDOSCOPIC RETROGRADE CHOLANGIOPANCREATOGRAPHY (ERCP);  Surgeon: Ladene Artist, MD;  Location: [facility];  Service: Endoscopy;  Laterality: N/A;  . EYE SURGERY    . IR ANGIOGRAM  EXTREMITY LEFT  01/18/2017  . IR ANGIOGRAM PELVIS SELECTIVE OR SUPRASELECTIVE  01/18/2017  . IR ANGIOGRAM SELECTIVE EACH ADDITIONAL VESSEL  01/18/2017  . IR AORTAGRAM ABDOMINAL SERIALOGRAM  01/18/2017  . IR EMBO ARTERIAL NOT HEMORR HEMANG INC GUIDE ROADMAPPING  01/18/2017  . IR RADIOLOGIST EVAL & MGMT  01/04/2017  . IR RADIOLOGIST EVAL & MGMT  10/26/2017  . IR RADIOLOGIST EVAL & MGMT  05/24/2018  . IR US GUIDE VASC ACCESS RIGHT  01/18/2017  . KNEE ARTHROSCOPY Left    x 2  . LEFT HEART CATH AND CORS/GRAFTS ANGIOGRAPHY N/A 04/20/2017   Procedure: Left Heart Cath and Cors/Grafts Angiography;  Surgeon: Lorretta Harp, MD;  Location: [facility] INVASIVE CV LAB;  LAD now occluded prior to SP1.LIMA-LAD. Patent RCA and circumflex stents. EF 45-50%. Relatively stable.   Marland Kitchen LEFT HEART CATHETERIZATION WITH CORONARY ANGIOGRAM N/A 05/15/2014   Procedure: LEFT HEART CATHETERIZATION WITH CORONARY ANGIOGRAM;  Surgeon: Sinclair Grooms, MD;  Location: [facility] C[facility] LAB;  Service: Cardiovascular;  Laterality: N/A;  . LEFT HEART CATHETERIZATION WITH CORONARY/GRAFT ANGIOGRAM N/A 06/28/2014   Procedure: LEFT HEART CATHETERIZATION WITH Beatrix Fetters;  Surgeon: Troy Sine, MD;  Location: [facility] LAB;  Service: Cardiovascular;  Laterality: N/A;  . LUMBAR PERCUTANEOUS PEDICLE SCREW 1 LEVEL N/A 11/22/2012   Procedure: LUMBAR PERCUTANEOUS PEDICLE SCREW 1 LEVEL;  Surgeon: Eustace Moore, MD;  Location: [place] [facility];  Service: Neurosurgery;  Laterality: N/A;  Lumbar Three-Four Percutaneous Pedicle Screw, Lateral approach  . MINOR HEMORRHOIDECTOMY    . NM MYOVIEW LTD  December 2013   LOW RISK. Mmoderate region of mid to basal  inferolateral scar without ischemia. Mild apical hypokinesis with an EF of 47%.  Marland Kitchen PERCUTANEOUS CORONARY STENT INTERVENTION (PCI-S)  September 2004   PCI - RCA 2 overlapping Taxus DES 2.75 mm x 32 mm and 2.75 mm x 12 mm (3.0 mm); PCI-Cx-OM1 - Taxus DES 3.0 mm x 20 mm (3.1 mm);   Marland Kitchen PERCUTANEOUS CORONARY STENT INTERVENTION (PCI-S)  12/2005   80% ISR in proximal Taxus stent in RCA -- covered proximally with Cypher DES 3.0 mm x 12 mm  . PERCUTANEOUS CORONARY STENT INTERVENTION (PCI-S) N/A 05/17/2014   Procedure: PERCUTANEOUS CORONARY STENT INTERVENTION (PCI-S);  Surgeon: Sinclair Grooms, MD;  Location[facility] CATH LAB: PCI pRCA stent ISR - Promus DES 3.0 x 8 (3.25), OM distal stent edge - Promus DES 2.75 x 12 (3.1)  . PERIPHERAL VASCULAR CATHETERIZATION  11/03/2016   Procedure: Embolization;  Surgeon: Waynetta Sandy, MD;  Location: [place] CV LAB;  Service: Cardiovascular;;  . POSTERIOR CERVICAL FUSION/FORAMINOTOMY N/A 05/02/2013   Procedure: POSTERIOR CERVICAL FUSION/FORAMINOTOMY CERVICAL SEVEN THORACIC-ONE;  Surgeon: Eustace Moore, MD;  Location: [place] [facility];  Service: Neurosurgery;  Laterality: N/A;  POSTERIOR CERVICAL FUSION/FORAMINOTOMY CERVICAL SEVEN THORACIC-ONE  . SPHINCTEROTOMY N/A 06/30/2015   Procedure: SPHINCTEROTOMY;  Surgeon: Ladene Artist, MD;  Location: [facility];  Service: Endoscopy;  Laterality: N/A;  . TOTAL KNEE ARTHROPLASTY Right   . TOTAL KNEE ARTHROPLASTY Left 12/20/2016   Procedure: LEFT TOTAL KNEE ARTHROPLASTY;  Surgeon: Vickey Huger, MD;  Location: [place];  Service: Orthopedics;  Laterality: Left;  . TRANSTHORACIC ECHOCARDIOGRAM  December 2013; October 2016   a. EF 55-60%. mod LA dilation. Aortic Sclerosis;; b. EF 55-60%, Gr 1 DD, Mild-Mod AS ([place]k - Mean Gradient 18-10 mmHg)  . TRANSTHORACIC ECHOCARDIOGRAM  07/2016   EF 45-50%. Inferolateral hypokinesis (correlates with scar noted on Myoview). GR 1 DD.  Mild aortic stenosis. Mod LAE.  Marland Kitchen VISCERAL ANGIOGRAM   11/03/2016   Procedure: Visceral Angiogram;  Surgeon: Waynetta Sandy, MD;  Location: [place] CV LAB;  Service: Cardiovascular;;   Social History   Occupational History  . Not on file  Tobacco Use  . Smoking status: Former Smoker    Packs/day: 3.00    Years: 45.00    Pack years: 135.00    Types: Cigarettes    Last attempt to quit: 10/05/1983    Years since quitting: 34.9  . Smokeless tobacco: Never Used  Substance and Sexual Activity  . Alcohol use: Yes    Alcohol/week: 4.0 standard drinks    Types: 4 Glasses of wine per week    Comment:  4 oz. wine daily  . Drug use: No  . Sexual activity: Not Currently

## 2018-10-07 ENCOUNTER — Ambulatory Visit
Admission: RE | Admit: 2018-10-07 | Discharge: 2018-10-07 | Disposition: A | Discharge status: Home / Self Care | Payer: Medicare Other | Source: Ambulatory Visit | Attending: Orthopaedic Surgery | Admitting: Orthopaedic Surgery

## 2018-10-07 ENCOUNTER — Other Ambulatory Visit: Payer: Medicare Other

## 2018-10-07 DIAGNOSIS — M25511 Pain in right shoulder: Secondary | ICD-10-CM

## 2018-10-07 DIAGNOSIS — M75121 Complete rotator cuff tear or rupture of right shoulder, not specified as traumatic: Secondary | ICD-10-CM | POA: Diagnosis not present

## 2018-10-10 ENCOUNTER — Ambulatory Visit (INDEPENDENT_AMBULATORY_CARE_PROVIDER_SITE_OTHER): Payer: Medicare Other | Admitting: Orthopaedic Surgery

## 2018-10-12 ENCOUNTER — Other Ambulatory Visit (INDEPENDENT_AMBULATORY_CARE_PROVIDER_SITE_OTHER): Payer: Self-pay | Admitting: *Deleted

## 2018-10-12 ENCOUNTER — Ambulatory Visit (INDEPENDENT_AMBULATORY_CARE_PROVIDER_SITE_OTHER): Payer: Medicare Other | Admitting: Orthopaedic Surgery

## 2018-10-12 ENCOUNTER — Encounter (INDEPENDENT_AMBULATORY_CARE_PROVIDER_SITE_OTHER): Payer: Self-pay | Admitting: Orthopaedic Surgery

## 2018-10-12 DIAGNOSIS — M25511 Pain in right shoulder: Secondary | ICD-10-CM

## 2018-10-12 DIAGNOSIS — G8929 Other chronic pain: Secondary | ICD-10-CM

## 2018-10-12 NOTE — Progress Notes (Signed)
Office Visit Note   Patient: [name]           Date of Birth: 06-Nov-1935           MRN: [id number] Visit Date: 10/12/2018              Requested by: Julian Lung, MD [place], [place] [address] PCP: Julian Lung, MD   Assessment & Plan: Visit Diagnoses:  1. Acute pain of right shoulder     Plan:  MRI scan of right shoulder demonstrates tear of the biceps long head.  There is moderately severe to severe osteoarthritis of the acromioclavicular joint.  Mild osteoarthritis of the glenohumeral joint and a partial rotator cuff tear valving the posterior supraspinatus and anterior infraspinatus on the undersurface of the tendons.  There may be up to a centimeter of retraction.  I.e. partial tear  Long discussion with [name].  He is doing well.  He like to start the course of physical therapy return in 3 to 4 weeks.  We have also discussed surgery and the potential problems over time the moment he is doing very well  Follow-Up Instructions: Return if symptoms worsen or fail to improve.   Orders:  No orders of the defined types were placed in this encounter.  No orders of the defined types were placed in this encounter.     Procedures: No procedures performed   Clinical Data: No additional findings.   Subjective: Chief Complaint  Patient presents with  . Right Shoulder - Follow-up  . Follow-up    MRI results    HPI  Review of Systems   Objective: Vital Signs: There were no vitals taken for this visit.  Physical Exam Constitutional:      Appearance: He is well-developed.  Eyes:     Pupils: Pupils are equal, round, and reactive to light.  Pulmonary:     Effort: Pulmonary effort is normal.  Skin:    General: Skin is warm and dry.  Neurological:     Mental Status: He is alert and oriented to person, place, and time.  Psychiatric:        Behavior: Behavior normal.     Ortho Exam awake alert and oriented x3.  The  biceps muscle right arm is in an inferior position consistent with a long head of the biceps tendon tear.  He is able to quickly and easily place his right arm over his head.  Minimally positive impingement.  Good grip and good release.  Had no induration.  No pain over the biceps muscle.  Specialty Comments:  No specialty comments available.  Imaging: No results found.   PMFS History: Patient Active Problem List   Diagnosis Date Noted  . Senile purpura ([place]) 07/20/2017  . S/P total knee replacement 12/20/2016  . Endoleak post (EVAR) endovascular aneurysm repair ([place]) 03/16/2016  . Mild aortic stenosis by prior echocardiogram 02/02/2016  . Statin intolerance 09/30/2015  . Calculus of bile duct with obstruction and without cholangitis or cholecystitis   . Obstruction of biliary stent   . Presence of drug coated stent in right coronary artery and circumflex coronary artery 01/08/2015  . Chronic renal insufficiency, stage III (moderate) ([place]) 06/06/2014  . Coronary stent restenosis with uncertain cause: Required PCI for ISR of OM1 (Promus P 2.75 mm x 12 mm) & pRCA 05/17/2014  . Obesity (BMI 30-39.9) 06/28/2013  . Encounter for long-term (current) use of medications 06/28/2013  . ACE-inhibitor cough  04/16/2013  . Chronic diastolic CHF (congestive heart failure) ([place]) 11/24/2012  . Type II diabetes mellitus with complication -- CAD, AAA 03/04/2011    Class: Diagnosis of  . Hypertensive heart disease with chronic diastolic congestive heart failure ([place]) 03/04/2011  . Hyperlipidemia LDL goal <70; statin intolerant 03/04/2011    Class: Diagnosis of  . ED (erectile dysfunction) of organic origin 03/04/2011  . GERD (gastroesophageal reflux disease) 03/04/2011  . S/P CABG x 2: 1997. SVG-OM (known to be occluded), LIMA-LAD 06/28/1996  . History of ST elevation myocardial infarction (STEMI) of inferolateral wall (PTCA - 100% large lateral OM) 06/28/1996    Class: History of  . CAD S/P CABG  '95- several PCis since, last 05/17/14 06/28/1994    Class: History of   Past Medical History:  Diagnosis Date  . Adenomatous colon polyp   . Ankle edema    Chronic  . Asthma   . CAD S/P percutaneous coronary angioplasty 06/2003, 12/2005, 05/2014   a) '04: Staged Taxus DES PCI to RCA (2 Taxus 2.75 x 32 & 12) and Cx-OM (Taxus 3 x 20);;'07 - PCI to pRCA ISR- Cypher DES; c) 05/2014: pPCI pRCA stentISR (Promus DES 3 x 8) OM1 distal to stent (Promus DES 2.75 x 12 - 3.1)  . CAD, multiple vessel 1985   Most recent July 2018: Admitted for non-STEMI. Occluded LAD with patent LIMA (SP1 now occluded).  Patent stents in proximal and mid RCA as well as circumflex-OM 3. Known occlusion of SVG-OM. EF 45-50%  . Cholelithiasis - with cholangitis & choledocholithiasis    status post ERCP with removal of calculi and biliary stent placement.  . Chronic anemia    On iron supplement; history of positive guaiac - negative colonoscopy in 1996.; Thought to be related to hemorrhoids; status post hemorrhoidectomy  . Chronic back pain     multiple surgeries; C-spine and lumbar  . Diabetes mellitus    on oral medication  . Diverticulitis of colon 1996  . Diverticulosis   . Dyslipidemia, goal LDL below 70   . Erectile dysfunction   . Exertional dyspnea    Chronic baseline SOB with ambulation  . GERD (gastroesophageal reflux disease)   . H/O: pneumonia February 14  . Hemorrhoids   . Hiatal hernia   . History of: ST elevation myocardial infarction (STEMI) involving left circumflex coronary artery with complication 6629   PTCA-circumflex; PCI in 1991  . Hypertension   . Moderate aortic stenosis by prior echocardiogram 07/2015   Normal LV function - EF 55-60%.. Abnormal relaxation. Mild-moderate aortic stenosis (peak/mean gradient 18/10 mmH)  . Osteoarthritis of both knees    And back; multiple back surgeries, right knee arthroplasty and left knee arthroscopic surgery x2  . Pneumonia 2016  . S/P AAA (abdominal  aortic aneurysm) repair 08/30/2012   s/p EVAR  . S/P CABG x 2 1997   LIMA-LAD, SVG-OM  . Sleep apnea    pt. states he was told to return for f/u, to be fitted for Cpap, but pt. reports that he didn't follow up-no cpap used    Family History  Problem Relation Age of Onset  . Arthritis Mother   . Diabetes Father   . Heart disease Father   . Stroke Sister   . Hypertension Sister   . Heart disease Sister   . Diabetes Sister   . Breast cancer Sister   . Heart disease Brother   . Ulcers Brother   . Colon cancer Neg Hx  Past Surgical History:  Procedure Laterality Date  . Abdominal and Lower Extremity Arterial Ultrasound  08/23/2012; 10/10/2013   Normal ABIs. Nonocclusive lower extremity disease. 4.2 cm x 4.3 cm infrarenal AAA;; 4.4 cm x 4.3 cm (essentially stable)   . ABDOMINAL AORTIC ENDOVASCULAR STENT GRAFT N/A 08/13/2015   Procedure: ABDOMINAL AORTIC ENDOVASCULAR STENT GRAFT;  Surgeon: Mal Misty, MD;  Location: [facility];  Service: Vascular;  Laterality: N/A;  . Anterior cervical plating  04/23/10   At C4-5 and a C6-7 utilizing two separate Biomet MaxAn plates.  . ANTERIOR LAT LUMBAR FUSION Left 11/22/2012   Procedure: ANTERIOR LATERAL LUMBAR FUSION 1 LEVEL;  Surgeon: Eustace Moore, MD;  Location: [facility];  Service: Neurosurgery;  Laterality: Left;  Anterior Lateral Lumbar Fusion Lumbar Three-Four  . BACK SURGERY  1979 & x 10   pt. remarks, "I have had about 10 back surgeries"  . BILIARY STENT PLACEMENT N/A 07/03/2014   Procedure: BILIARY STENT PLACEMENT;  Surgeon: Inda Castle, MD;  Location: [facility];  Service: Endoscopy;  Laterality: N/A;  . CARDIAC CATHETERIZATION  September 2004   None Occluded vein graft to OM; diffuse RCA disease in the mid vessel, 80% circumflex-OM stenosis; follow on AV groove circumflex with sequential 90% stenoses and intervening saccular dilation   . CARDIAC CATHETERIZATION  March 2010   4 abnormal Myoview showing apical thinning (possibly  due to apical LAD 95%) : 100% Occluded LAD after SV1, distal LAD grafted via LIMA -apical 95% . Cx -OM1 w/patent stent extending into OM 1 . Follow on Cx - 70-80% - non-amenable PCI. RCA widely patent 3 overlapping stents in mRCA w/less than 40% stenosisin RPL; SVG-OM known occluded   . CATARACT EXTRACTION    . Cervical arthrodesis  04/23/10   Anterior cervical arthrodesis, C4-5, C6-7 utilizing 7-mm PEEK interbody cage packed with local autograft & Antifuse putty at C4-5 & an 8-mm cage at C6-7.  Marland Kitchen CERVICAL DISCECTOMY  04/23/10   Decompressive anterior carvical diskectomy. C4-5, C6-7  . CHOLECYSTECTOMY N/A 05/23/2015   Procedure: LAPAROSCOPIC CHOLECYSTECTOMY WITH INTRAOPERATIVE CHOLANGIOGRAM;  Surgeon: Alphonsa Overall, MD;  Location: [facility];  Service: General;  Laterality: N/A;  . [place]  . CORONARY ANGIOPLASTY  1985,1991,15   1985 lateral STEMI Circumflex PTCA;   . CORONARY ARTERY BYPASS GRAFT  1997   LIMA-LAD, SVG-OM (SVG known to be occluded prior to 2004)  . ERCP N/A 07/03/2014   Procedure: ENDOSCOPIC RETROGRADE CHOLANGIOPANCREATOGRAPHY (ERCP);  Surgeon: Inda Castle, MD;  Location: [facility];  Service: Endoscopy;  Laterality: N/A;  . ERCP N/A 07/05/2014   Procedure: ENDOSCOPIC RETROGRADE CHOLANGIOPANCREATOGRAPHY (ERCP);  Surgeon: Inda Castle, MD;  Location: [facility];  Service: Endoscopy;  Laterality: N/A;  . ERCP N/A 06/30/2015   Procedure: ENDOSCOPIC RETROGRADE CHOLANGIOPANCREATOGRAPHY (ERCP);  Surgeon: Ladene Artist, MD;  Location: [facility];  Service: Endoscopy;  Laterality: N/A;  . EYE SURGERY    . IR ANGIOGRAM EXTREMITY LEFT  01/18/2017  . IR ANGIOGRAM PELVIS SELECTIVE OR SUPRASELECTIVE  01/18/2017  . IR ANGIOGRAM SELECTIVE EACH ADDITIONAL VESSEL  01/18/2017  . IR AORTAGRAM ABDOMINAL SERIALOGRAM  01/18/2017  . IR EMBO ARTERIAL NOT HEMORR HEMANG INC GUIDE ROADMAPPING  01/18/2017  . IR RADIOLOGIST EVAL & MGMT  01/04/2017  . IR RADIOLOGIST EVAL & MGMT  10/26/2017  . IR  RADIOLOGIST EVAL & MGMT  05/24/2018  . IR US GUIDE VASC ACCESS RIGHT  01/18/2017  . KNEE ARTHROSCOPY Left    x 2  .  LEFT HEART CATH AND CORS/GRAFTS ANGIOGRAPHY N/A 04/20/2017   Procedure: Left Heart Cath and Cors/Grafts Angiography;  Surgeon: Lorretta Harp, MD;  [facility] INVASIVE CV LAB;  LAD now occluded prior to SP1.LIMA-LAD. Patent RCA and circumflex stents. EF 45-50%. Relatively stable.   Marland Kitchen LEFT HEART CATHETERIZATION WITH CORONARY ANGIOGRAM N/A 05/15/2014   Procedure: LEFT HEART CATHETERIZATION WITH CORONARY ANGIOGRAM;  Surgeon: Sinclair Grooms, MD;  Location: [facility] CATH LAB;  Service: Cardiovascular;  Laterality: N/A;  . LEFT HEART CATHETERIZATION WITH CORONARY/GRAFT ANGIOGRAM N/A 06/28/2014   Procedure: LEFT HEART CATHETERIZATION WITH Beatrix Fetters;  Surgeon: Troy Sine, MD;  Location: [facility] CATH LAB;  Service: Cardiovascular;  Laterality: N/A;  . LUMBAR PERCUTANEOUS PEDICLE SCREW 1 LEVEL N/A 11/22/2012   Procedure: LUMBAR PERCUTANEOUS PEDICLE SCREW 1 LEVEL;  Surgeon: Eustace Moore, MD;  Location: [facility] ORS;  Service: Neurosurgery;  Laterality: N/A;  Lumbar Three-Four Percutaneous Pedicle Screw, Lateral approach  . MINOR HEMORRHOIDECTOMY    . NM MYOVIEW LTD  December 2013   LOW RISK. Mmoderate region of mid to basal inferolateral scar without ischemia. Mild apical hypokinesis with an EF of 47%.  Marland Kitchen PERCUTANEOUS CORONARY STENT INTERVENTION (PCI-S)  September 2004   PCI - RCA 2 overlapping Taxus DES 2.75 mm x 32 mm and 2.75 mm x 12 mm (3.0 mm); PCI-Cx-OM1 - Taxus DES 3.0 mm x 20 mm (3.1 mm);   Marland Kitchen PERCUTANEOUS CORONARY STENT INTERVENTION (PCI-S)  12/2005   80% ISR in proximal Taxus stent in RCA -- covered proximally with Cypher DES 3.0 mm x 12 mm  . PERCUTANEOUS CORONARY STENT INTERVENTION (PCI-S) N/A 05/17/2014   Procedure: PERCUTANEOUS CORONARY STENT INTERVENTION (PCI-S);  Surgeon: Sinclair Grooms, MD;  Location: [facility] CATH LAB: PCI pRCA stent ISR - Promus DES 3.0 x 8 (3.25), OM  distal stent edge - Promus DES 2.75 x 12 (3.1)  . PERIPHERAL VASCULAR CATHETERIZATION  11/03/2016   Procedure: Embolization;  Surgeon: Waynetta Sandy, MD;  Location: [facility] CV LAB;  Service: Cardiovascular;;  . POSTERIOR CERVICAL FUSION/FORAMINOTOMY N/A 05/02/2013   Procedure: POSTERIOR CERVICAL FUSION/FORAMINOTOMY CERVICAL SEVEN THORACIC-ONE;  Surgeon: Eustace Moore, MD;  Location: [facility]S;  Service: Neurosurgery;  Laterality: N/A;  POSTERIOR CERVICAL FUSION/FORAMINOTOMY CERVICAL SEVEN THORACIC-ONE  . SPHINCTEROTOMY N/A 06/30/2015   Procedure: SPHINCTEROTOMY;  Surgeon: Ladene Artist, MD;  Location: [facility];  Service: Endoscopy;  Laterality: N/A;  . TOTAL KNEE ARTHROPLASTY Right   . TOTAL KNEE ARTHROPLASTY Left 12/20/2016   Procedure: LEFT TOTAL KNEE ARTHROPLASTY;  Surgeon: Vickey Huger, MD;  Location: [facility];  Service: Orthopedics;  Laterality: Left;  . TRANSTHORACIC ECHOCARDIOGRAM  December 2013; October 2016   a. EF 55-60%. mod LA dilation. Aortic Sclerosis;; b. EF 55-60%, Gr 1 DD, Mild-Mod AS (Peark - Mean Gradient 18-10 mmHg)  . TRANSTHORACIC ECHOCARDIOGRAM  07/2016   EF 45-50%. Inferolateral hypokinesis (correlates with scar noted on Myoview). GR 1 DD. Mild aortic stenosis. Mod LAE.  Marland Kitchen VISCERAL ANGIOGRAM  11/03/2016   Procedure: Visceral Angiogram;  Surgeon: Waynetta Sandy, MD;  Location: [facility] CV LAB;  Service: Cardiovascular;;   Social History   Occupational History  . Not on file  Tobacco Use  . Smoking status: Former Smoker    Packs/day: 3.00    Years: 45.00    Pack years: 135.00    Types: Cigarettes    Last attempt to quit: 10/05/1983    Years since quitting: 35.0  . Smokeless tobacco: Never  Used  Substance and Sexual Activity  . Alcohol use: Yes    Alcohol/week: 4.0 standard drinks    Types: 4 Glasses of wine per week    Comment:  4 oz. wine daily  . Drug use: No  . Sexual activity: Not Currently     Garald Balding, MD   Note  - This record has been created using [employer].  Chart creation errors have been sought, but may not always  have been located. Such creation errors do not reflect on  the standard of medical care.

## 2018-10-20 DIAGNOSIS — M25511 Pain in right shoulder: Secondary | ICD-10-CM | POA: Diagnosis not present

## 2018-10-20 DIAGNOSIS — S46119D Strain of muscle, fascia and tendon of long head of biceps, unspecified arm, subsequent encounter: Secondary | ICD-10-CM | POA: Diagnosis not present

## 2018-10-23 ENCOUNTER — Telehealth (INDEPENDENT_AMBULATORY_CARE_PROVIDER_SITE_OTHER): Payer: Self-pay

## 2018-10-23 NOTE — Telephone Encounter (Signed)
MRI report faxed to [facility] [phone]

## 2018-10-25 DIAGNOSIS — S46119D Strain of muscle, fascia and tendon of long head of biceps, unspecified arm, subsequent encounter: Secondary | ICD-10-CM | POA: Diagnosis not present

## 2018-10-25 DIAGNOSIS — M25511 Pain in right shoulder: Secondary | ICD-10-CM | POA: Diagnosis not present

## 2018-10-27 DIAGNOSIS — S46119D Strain of muscle, fascia and tendon of long head of biceps, unspecified arm, subsequent encounter: Secondary | ICD-10-CM | POA: Diagnosis not present

## 2018-10-27 DIAGNOSIS — M25511 Pain in right shoulder: Secondary | ICD-10-CM | POA: Diagnosis not present

## 2018-10-31 DIAGNOSIS — M25511 Pain in right shoulder: Secondary | ICD-10-CM | POA: Diagnosis not present

## 2018-10-31 DIAGNOSIS — S46119D Strain of muscle, fascia and tendon of long head of biceps, unspecified arm, subsequent encounter: Secondary | ICD-10-CM | POA: Diagnosis not present

## 2018-11-01 ENCOUNTER — Other Ambulatory Visit: Payer: Self-pay | Admitting: Cardiology

## 2018-11-01 MED ORDER — FENOFIBRIC ACID 135 MG PO CPDR: 1.0000 | DELAYED_RELEASE_CAPSULE | Freq: Every day | ORAL | 0 refills | Status: DC

## 2018-11-01 NOTE — Telephone Encounter (Signed)
° ° ° °*  STAT* If patient is at the pharmacy, call can be transferred to refill team.   1. Which medications need to be refilled? (please list name of each medication and dose if known) Choline Fenofibrate (FENOFIBRIC ACID) 135 MG CPDR  2. Which pharmacy/location (including street and city if local pharmacy) is medication to be sent to? [facility] 3. Do they need a 30 day or 90 day supply? [place]

## 2018-11-02 ENCOUNTER — Telehealth: Payer: Self-pay | Admitting: Cardiology

## 2018-11-02 MED ORDER — FENOFIBRIC ACID 135 MG PO CPDR: 1.0000 | DELAYED_RELEASE_CAPSULE | Freq: Every day | ORAL | 0 refills | Status: DC

## 2018-11-02 NOTE — Telephone Encounter (Signed)
Follow up:   Patient wife calling concerning patient refill that has not sent to [facility]. Please call patient if this can not be refill.

## 2018-11-02 NOTE — Telephone Encounter (Signed)
RETURNED [facility] THAT REFILL IS FOR FENOFIBRATE. REFILL SENT TO [facility]

## 2018-11-03 DIAGNOSIS — S46119D Strain of muscle, fascia and tendon of long head of biceps, unspecified arm, subsequent encounter: Secondary | ICD-10-CM | POA: Diagnosis not present

## 2018-11-03 DIAGNOSIS — M25511 Pain in right shoulder: Secondary | ICD-10-CM | POA: Diagnosis not present

## 2018-11-08 DIAGNOSIS — M25511 Pain in right shoulder: Secondary | ICD-10-CM | POA: Diagnosis not present

## 2018-11-08 DIAGNOSIS — S46119D Strain of muscle, fascia and tendon of long head of biceps, unspecified arm, subsequent encounter: Secondary | ICD-10-CM | POA: Diagnosis not present

## 2018-11-10 DIAGNOSIS — M25511 Pain in right shoulder: Secondary | ICD-10-CM | POA: Diagnosis not present

## 2018-11-10 DIAGNOSIS — S46119D Strain of muscle, fascia and tendon of long head of biceps, unspecified arm, subsequent encounter: Secondary | ICD-10-CM | POA: Diagnosis not present

## 2018-11-14 DIAGNOSIS — S46119D Strain of muscle, fascia and tendon of long head of biceps, unspecified arm, subsequent encounter: Secondary | ICD-10-CM | POA: Diagnosis not present

## 2018-11-14 DIAGNOSIS — M25511 Pain in right shoulder: Secondary | ICD-10-CM | POA: Diagnosis not present

## 2018-11-17 ENCOUNTER — Other Ambulatory Visit: Payer: Self-pay | Admitting: Cardiology

## 2018-11-17 DIAGNOSIS — M25511 Pain in right shoulder: Secondary | ICD-10-CM | POA: Diagnosis not present

## 2018-11-17 DIAGNOSIS — S46119D Strain of muscle, fascia and tendon of long head of biceps, unspecified arm, subsequent encounter: Secondary | ICD-10-CM | POA: Diagnosis not present

## 2019-01-01 ENCOUNTER — Other Ambulatory Visit: Payer: Self-pay | Admitting: Cardiology

## 2019-01-03 ENCOUNTER — Other Ambulatory Visit: Payer: Self-pay | Admitting: Cardiovascular Disease

## 2019-01-10 ENCOUNTER — Telehealth: Payer: Self-pay

## 2019-01-10 NOTE — Telephone Encounter (Signed)
Called left a voice message for the patient to call back about his 01/18/19 appointment with Dr. Ellyn Hack and switching the appointment from an in office visit to a video or telephone visit.

## 2019-01-18 ENCOUNTER — Telehealth: Payer: Self-pay | Admitting: Cardiology

## 2019-01-18 ENCOUNTER — Ambulatory Visit: Payer: Medicare Other | Admitting: Cardiology

## 2019-01-18 NOTE — Telephone Encounter (Signed)
Pa for Ranexa

## 2019-01-18 NOTE — Telephone Encounter (Signed)
New Message    [name] from [place] prior authorization for Ranexa 500mg 

## 2019-01-23 NOTE — Telephone Encounter (Signed)
Follow Up;    Wife called and said pt needs a prescription for Ranolazine. She said the insurance will no longer pay for Ranexa. Please call this in to [facility].Marland KitchenT

## 2019-01-24 MED ORDER — FENOFIBRIC ACID 135 MG PO CPDR: 1.0000 | DELAYED_RELEASE_CAPSULE | Freq: Every day | ORAL | 2 refills | Status: DC

## 2019-01-24 MED ORDER — RANOLAZINE ER 500 MG PO TB12: 500.0000 mg | ORAL_TABLET | Freq: Two times a day (BID) | ORAL | 5 refills | Status: DC

## 2019-01-24 NOTE — Telephone Encounter (Signed)
Medication was e-sent to pharmacy - generic of Ranexa

## 2019-01-24 NOTE — Telephone Encounter (Signed)
Patient does not need PA for ranexa   medication was switch to generic - price was $77 instead of $200 for brand.   Information received from [facility].  wife is aware and willing to pay that amount.    RN also  Suggest to wife to contact [facility] to see generic is available there . She States she would. -    Request for  Fenofibrate to be refilled.  RN E-SENT RX TO [facility] AS REQUESTED.

## 2019-02-06 ENCOUNTER — Ambulatory Visit: Payer: Medicare Other | Admitting: Sports Medicine

## 2019-02-08 ENCOUNTER — Other Ambulatory Visit: Payer: Self-pay

## 2019-02-08 ENCOUNTER — Encounter: Payer: Self-pay | Admitting: Podiatry

## 2019-02-08 ENCOUNTER — Ambulatory Visit (INDEPENDENT_AMBULATORY_CARE_PROVIDER_SITE_OTHER): Payer: Medicare Other | Admitting: Podiatry

## 2019-02-08 VITALS — Temp 97.3°F

## 2019-02-08 DIAGNOSIS — M79676 Pain in unspecified toe(s): Secondary | ICD-10-CM | POA: Diagnosis not present

## 2019-02-08 DIAGNOSIS — L600 Ingrowing nail: Secondary | ICD-10-CM | POA: Diagnosis not present

## 2019-02-08 MED ORDER — NEOMYCIN-POLYMYXIN-HC 3.5-10000-1 OT SOLN: OTIC | 1 refills | Status: DC

## 2019-02-08 NOTE — Patient Instructions (Signed)

## 2019-02-08 NOTE — Progress Notes (Signed)
Subjective:  Patient ID: [name], male    DOB: 1936-06-26,  MRN: [id number]  Chief Complaint  Patient presents with  . Ingrown Toenail    possible ingrown toenail, left ffoot big toenail lateral to media, pain has been going on for 3-4 weeks, pain has gradually gotten worse, pt states that it is a pins and needles, kind of pain, pain is elevated when he gets pressure on it, pt has tried putting peroxide on it, but not much has helped    83 y.o. male presents with the above complaint. Hx above confirmed with patient.  Review of Systems: Negative except as noted in the HPI. Denies N/V/F/Ch.  Past Medical History:  Diagnosis Date  . Adenomatous colon polyp   . Ankle edema    Chronic  . Asthma   . CAD S/P percutaneous coronary angioplasty 06/2003, 12/2005, 05/2014   a) '04: Staged Taxus DES PCI to RCA (2 Taxus 2.75 x 32 & 12) and Cx-OM (Taxus 3 x 20);;'07 - PCI to pRCA ISR- Cypher DES; c) 05/2014: pPCI pRCA stentISR (Promus DES 3 x 8) OM1 distal to stent (Promus DES 2.75 x 12 - 3.1)  . CAD, multiple vessel 1985   Most recent July 2018: Admitted for non-STEMI. Occluded LAD with patent LIMA (SP1 now occluded).  Patent stents in proximal and mid RCA as well as circumflex-OM 3. Known occlusion of SVG-OM. EF 45-50%  . Cholelithiasis - with cholangitis & choledocholithiasis    status post ERCP with removal of calculi and biliary stent placement.  . Chronic anemia    On iron supplement; history of positive guaiac - negative colonoscopy in 1996.; Thought to be related to hemorrhoids; status post hemorrhoidectomy  . Chronic back pain     multiple surgeries; C-spine and lumbar  . Diabetes mellitus    on oral medication  . Diverticulitis of colon 1996  . Diverticulosis   . Dyslipidemia, goal LDL below 70   . Erectile dysfunction   . Exertional dyspnea    Chronic baseline SOB with ambulation  . GERD (gastroesophageal reflux disease)   . H/O: pneumonia February 14  . Hemorrhoids   .  Hiatal hernia   . History of: ST elevation myocardial infarction (STEMI) involving left circumflex coronary artery with complication 2458   PTCA-circumflex; PCI in 1991  . Hypertension   . Moderate aortic stenosis by prior echocardiogram 07/2015   Normal LV function - EF 55-60%.. Abnormal relaxation. Mild-moderate aortic stenosis (peak/mean gradient 18/10 mmH)  . Osteoarthritis of both knees    And back; multiple back surgeries, right knee arthroplasty and left knee arthroscopic surgery x2  . Pneumonia 2016  . S/P AAA (abdominal aortic aneurysm) repair 08/30/2012   s/p EVAR  . S/P CABG x 2 1997   LIMA-LAD, SVG-OM  . Sleep apnea    pt. states he was told to return for f/u, to be fitted for Cpap, but pt. reports that he didn't follow up-no cpap used    Current Outpatient Medications:  .  acetaminophen (TYLENOL) 500 MG tablet, Take 1,000 mg by mouth every 6 (six) hours as needed for mild pain, moderate pain or headache. Reported on 03/16/2016, Disp: , Rfl:  .  albuterol (PROVENTIL HFA;VENTOLIN HFA) 108 (90 Base) MCG/ACT inhaler, Inhale 2 puffs into the lungs every 6 (six) hours as needed for wheezing or shortness of breath., Disp: 1 Inhaler, Rfl: 0 .  Alirocumab (PRALUENT [place]), Inject into the skin every 14 (fourteen) days., Disp: , Rfl:  .  amLODipine (NORVASC) 5 MG tablet, Take 1 tablet by mouth once daily, Disp: 90 tablet, Rfl: 0 .  aspirin EC 81 MG EC tablet, Take 1 tablet (81 mg total) by mouth daily., Disp: 30 tablet, Rfl: 11 .  azithromycin (ZITHROMAX) 250 MG tablet, 2 tablets day 1, then 1 tablet days 2-4, Disp: 6 tablet, Rfl: 0 .  Choline Fenofibrate (FENOFIBRIC ACID) 135 MG CPDR, Take 1 capsule by mouth daily., Disp: 90 capsule, Rfl: 2 .  clopidogrel (PLAVIX) 75 MG tablet, TAKE 1 TABLET BY MOUTH ONCE DAILY, Disp: 90 tablet, Rfl: 4 .  ezetimibe (ZETIA) 10 MG tablet, Take 1 tablet by mouth once daily, Disp: 90 tablet, Rfl: 0 .  fluticasone furoate-vilanterol (BREO ELLIPTA) 100-25  MCG/INH AEPB, Inhale 1 puff into the lungs daily., Disp: 1 each, Rfl: 0 .  furosemide (LASIX) 20 MG tablet, Take 20 mg ( 1 tablet) by mouth every Monday and Thursday, may take an additional dose daily if needed., Disp: 90 tablet, Rfl: 3 .  glucose blood (ONETOUCH VERIO) test strip, USE ONE STRIP TO CHECK GLUCOSE TWICE DAILY E11.9, Disp: 180 each, Rfl: 2 .  Iron-Folic Acid-Vit H70 (IRON FORMULA PO), Take 65 mg by mouth 1 day or 1 dose., Disp: , Rfl:  .  losartan (COZAAR) 100 MG tablet, TAKE 1 TABLET BY MOUTH ONCE DAILY, Disp: 90 tablet, Rfl: 1 .  metFORMIN (GLUCOPHAGE) 500 MG tablet, TAKE 1 TABLET BY MOUTH ONCE DAILY WITH BREAKFAST (Patient taking differently: Take 500 mg by mouth every other day. ), Disp: 90 tablet, Rfl: 1 .  Multiple Vitamins-Minerals (MULTIVITAMIN WITH MINERALS) tablet, Take 1 tablet by mouth daily. , Disp: , Rfl:  .  pantoprazole (PROTONIX) 40 MG tablet, TAKE 1 TABLET BY MOUTH ONCE DAILY, Disp: 90 tablet, Rfl: 1 .  ranolazine (RANEXA) 500 MG 12 hr tablet, Take 1 tablet (500 mg total) by mouth 2 (two) times daily., Disp: 60 tablet, Rfl: 5 .  sildenafil (REVATIO) 20 MG tablet, TAKE 2-5 TABLETS BY MOUTH AS NEEDED FOR SEXUAL ACTIVITY, Disp: 50 tablet, Rfl: 5 .  neomycin-polymyxin-hydrocortisone (CORTISPORIN) OTIC solution, Apply 1-2 drops to toe BID after soaking, Disp: 10 mL, Rfl: 1  Social History   Tobacco Use  Smoking Status Former Smoker  . Packs/day: 3.00  . Years: 45.00  . Pack years: 135.00  . Types: Cigarettes  . Last attempt to quit: 10/05/1983  . Years since quitting: 35.3  Smokeless Tobacco Never Used    Allergies  Allergen Reactions  . Oxycodone Shortness Of Breath and Cough  . Lisinopril Cough  . Statins Other (See Comments)    MYALGIAS   . Welchol [Colesevelam Hcl] Itching   Objective:   Vitals:   02/08/19 0954  Temp: (!) 97.3 F (36.3 C)   There is no height or weight on file to calculate BMI. Constitutional Well developed. Well nourished.   Vascular Dorsalis pedis pulses palpable bilaterally. Posterior tibial pulses palpable bilaterally. Capillary refill normal to all digits.  No cyanosis or clubbing noted. Pedal hair growth diminished.  Neurologic Normal speech. Oriented to person, place, and time. Epicritic sensation to light touch grossly present bilaterally.  Dermatologic Painful ingrowing nail at left nail borders of the hallux nail left. No other open wounds. No skin lesions.  Orthopedic: Normal joint ROM without pain or crepitus bilaterally. No visible deformities. No bony tenderness.   Radiographs: None Assessment:   1. Ingrown nail   2. Pain around toenail    Plan:  Patient was evaluated and  treated and all questions answered.  Ingrown Nail, left -Patient elects to proceed with minor surgery to remove ingrown toenail removal today. Consent reviewed and signed by patient. -Ingrown nail excised. See procedure note. -Educated on post-procedure care including soaking. Written instructions provided and reviewed. -Patient to follow up in 2 weeks for nail check.  Procedure: Excision of Ingrown Toenail Location: Left 1st both nail borders. Anesthesia: Lidocaine 1% plain; 1.5 mL and Marcaine 0.5% plain; 1.5 mL, digital block. Skin Prep: Betadine. Dressing: Silvadene; telfa; dry, sterile, compression dressing. Technique: Following skin prep, the toe was exsanguinated and a tourniquet was secured at the base of the toe. The affected nail border was freed, split with a nail splitter, and excised. Chemical matrixectomy was then performed with phenol and irrigated out with alcohol. The tourniquet was then removed and sterile dressing applied. Disposition: Patient tolerated procedure well. Patient to return in 2 weeks for follow-up.   Return in about 2 weeks (around 02/22/2019) for Nail Check.

## 2019-02-22 ENCOUNTER — Other Ambulatory Visit: Payer: Self-pay

## 2019-02-22 ENCOUNTER — Ambulatory Visit (INDEPENDENT_AMBULATORY_CARE_PROVIDER_SITE_OTHER): Payer: Medicare Other | Admitting: Podiatry

## 2019-02-22 ENCOUNTER — Encounter: Payer: Self-pay | Admitting: Podiatry

## 2019-02-22 VITALS — Temp 97.3°F

## 2019-02-22 DIAGNOSIS — M79676 Pain in unspecified toe(s): Secondary | ICD-10-CM

## 2019-02-22 DIAGNOSIS — L600 Ingrowing nail: Secondary | ICD-10-CM

## 2019-02-22 NOTE — Progress Notes (Signed)
  Subjective:  Patient ID: [name], male    DOB: September 12, 1936,  MRN: [id number]  Chief Complaint  Patient presents with  . Ingrown Toenail    f/u on ingrown toenail procedure of the left big toenail, pt states that he is doing a lot better, and has been taking the neomycin drops as wel   83 y.o. male returns for the above complaint. Doing well denies pain or concern.  Objective:   General AA&O x3. Normal mood and affect.  Vascular Foot warm and well perfused with good capillary refill.  Neurologic Sensation grossly intact.  Dermatologic Nail avulsion site healing well without drainage or erythema. Nail bed with overlying soft crust. Left intact. No signs of local infection.  Orthopedic: No tenderness to palpation of the toe.   Assessment & Plan:  Patient was evaluated and treated and all questions answered.  S/p Ingrown Toenail Excision, left. -Healing well without issue. -Discussed return precautions. -F/u PRN

## 2019-03-19 ENCOUNTER — Other Ambulatory Visit: Payer: Self-pay | Admitting: Cardiology

## 2019-03-20 ENCOUNTER — Other Ambulatory Visit: Payer: Self-pay

## 2019-03-20 ENCOUNTER — Ambulatory Visit: Payer: Medicare Other | Admitting: Orthopaedic Surgery

## 2019-03-20 ENCOUNTER — Ambulatory Visit (INDEPENDENT_AMBULATORY_CARE_PROVIDER_SITE_OTHER): Payer: Medicare Other

## 2019-03-20 ENCOUNTER — Ambulatory Visit (INDEPENDENT_AMBULATORY_CARE_PROVIDER_SITE_OTHER): Payer: Medicare Other | Admitting: Physician Assistant

## 2019-03-20 ENCOUNTER — Encounter: Payer: Self-pay | Admitting: Physician Assistant

## 2019-03-20 DIAGNOSIS — M79605 Pain in left leg: Secondary | ICD-10-CM | POA: Diagnosis not present

## 2019-03-20 DIAGNOSIS — M7061 Trochanteric bursitis, right hip: Secondary | ICD-10-CM

## 2019-03-20 DIAGNOSIS — M25551 Pain in right hip: Secondary | ICD-10-CM

## 2019-03-20 DIAGNOSIS — M25552 Pain in left hip: Secondary | ICD-10-CM

## 2019-03-20 MED ORDER — METHYLPREDNISOLONE ACETATE 40 MG/ML IJ SUSP
40.0000 mg | INTRAMUSCULAR | Status: AC | PRN
  Administered 2019-03-20: 17:00:00 40 mg via INTRA_ARTICULAR

## 2019-03-20 MED ORDER — LIDOCAINE HCL 1 % IJ SOLN
3.0000 mL | INTRAMUSCULAR | Status: AC | PRN
Start: 2019-03-20 — End: 2019-03-20
  Administered 2019-03-20: 17:00:00 3 mL

## 2019-03-20 NOTE — Progress Notes (Signed)
Office Visit Note   Patient: [name]           Date of Birth: 09-12-1936           MRN: [id number] Visit Date: 03/20/2019              Requested by: Denita Lung, MD [place],  [place] [id number] PCP: Denita Lung, MD   Assessment & Plan: Visit Diagnoses:  1. Trochanteric bursitis, right hip   2. Pain of right hip joint     Plan: We will see him back in 2 weeks see what type of response he had to the left hip trochanteric injection.  He shown IT band stretching exercises.  He is also having some numbness tingling in his right arm and will further evaluate he states is been getting better with some Tylenol.  Did briefly examine him he has positive Tinel's over the median nerve.  He describes numbness in the whole hand.  Follow-Up Instructions: Return in about 2 weeks (around 04/03/2019).   Orders:  Orders Placed This Encounter  Procedures  . Large Joint Inj: R greater trochanter  . XR HIP UNILAT W OR W/O PELVIS 1V LEFT  . XR Lumbar Spine 2-3 Views   No orders of the defined types were placed in this encounter.     Procedures: Large Joint Inj: R greater trochanter on 03/20/2019 5:14 PM Indications: pain Details: 22 G 1.5 in needle, lateral approach  Arthrogram: No  Medications: 3 mL lidocaine 1 %; 40 mg methylPREDNISolone acetate 40 MG/ML Outcome: tolerated well, no immediate complications Procedure, treatment alternatives, risks and benefits explained, specific risks discussed. Consent was given by the patient. Immediately prior to procedure a time out was called to verify the correct patient, procedure, equipment, support staff and site/side marked as required. Patient was prepped and draped in the usual sterile fashion.       Clinical Data: No additional findings.   Subjective: Chief Complaint  Patient presents with  . Left Hip - Pain    HPI [name] is a 83 year old male who comes in today for right hip pain and buttocks  pain.  No radicular symptoms down the leg.  Has pain with standing.  He has had multiple back surgeries he recounts 10 surgeries in all.  He has had no history of trauma to the right hip.  He is having no groin pain.  Patient is diabetic reports his glucose levels are well controlled. Review of Systems Denies any fevers or chills please see HPI otherwise negative  Objective: Vital Signs: There were no vitals taken for this visit.  Physical Exam General: Well-developed well-nourished male no acute distress mood and affect appropriate.  Psych alert and oriented x3. Ortho Exam Bilateral hips he has good range of motion of both hips without pain.  He has tenderness over the right trochanteric region only. Specialty Comments:  No specialty comments available.  Imaging: Xr Hip Unilat W Or W/o Pelvis 1v Left  Result Date: 03/20/2019 AP pelvis and lateral view of the right hip shows no acute fractures.  Bilateral hips are well located and well preserved.  No bony abnormalities.  Xr Lumbar Spine 2-3 Views  Result Date: 03/20/2019 Lumbar spine AP and lateral views: No acute fracture.  Degenerative disc changes throughout the lumbar spine..  Status post fusion of L3-4 without any hardware failure or loosening.  Postsurgical changes from aortic stent.    PMFS History: Patient Active Problem List  Diagnosis Date Noted  . Senile purpura ([place]) 07/20/2017  . S/P total knee replacement 12/20/2016  . Chronic pain of left knee 10/19/2016  . Endoleak post (EVAR) endovascular aneurysm repair ([place]) 03/16/2016  . Mild aortic stenosis by prior echocardiogram 02/02/2016  . Statin intolerance 09/30/2015  . Calculus of bile duct with obstruction and without cholangitis or cholecystitis   . Obstruction of biliary stent   . Presence of drug coated stent in right coronary artery and circumflex coronary artery 01/08/2015  . Chronic renal insufficiency, stage III (moderate) ([place]) 06/06/2014  . Coronary stent  restenosis with uncertain cause: Required PCI for ISR of OM1 (Promus P 2.75 mm x 12 mm) & pRCA 05/17/2014  . Obesity (BMI 30-39.9) 06/28/2013  . Encounter for long-term (current) use of medications 06/28/2013  . ACE-inhibitor cough 04/16/2013  . Chronic diastolic CHF (congestive heart failure) ([place]) 11/24/2012  . Type II diabetes mellitus with complication -- CAD, AAA 03/04/2011    Class: Diagnosis of  . Hypertensive heart disease with chronic diastolic congestive heart failure ([place]) 03/04/2011  . Hyperlipidemia LDL goal <70; statin intolerant 03/04/2011    Class: Diagnosis of  . ED (erectile dysfunction) of organic origin 03/04/2011  . GERD (gastroesophageal reflux disease) 03/04/2011  . S/P CABG x 2: 1997. SVG-OM (known to be occluded), LIMA-LAD 06/28/1996  . History of ST elevation myocardial infarction (STEMI) of inferolateral wall (PTCA - 100% large lateral OM) 06/28/1996    Class: History of  . CAD S/P CABG '95- several PCis since, last 05/17/14 06/28/1994    Class: History of   Past Medical History:  Diagnosis Date  . Adenomatous colon polyp   . Ankle edema    Chronic  . Asthma   . CAD S/P percutaneous coronary angioplasty 06/2003, 12/2005, 05/2014   a) '04: Staged Taxus DES PCI to RCA (2 Taxus 2.75 x 32 & 12) and Cx-OM (Taxus 3 x 20);;'07 - PCI to pRCA ISR- Cypher DES; c) 05/2014: pPCI pRCA stentISR (Promus DES 3 x 8) OM1 distal to stent (Promus DES 2.75 x 12 - 3.1)  . CAD, multiple vessel 1985   Most recent July 2018: Admitted for non-STEMI. Occluded LAD with patent LIMA (SP1 now occluded).  Patent stents in proximal and mid RCA as well as circumflex-OM 3. Known occlusion of SVG-OM. EF 45-50%  . Cholelithiasis - with cholangitis & choledocholithiasis    status post ERCP with removal of calculi and biliary stent placement.  . Chronic anemia    On iron supplement; history of positive guaiac - negative colonoscopy in 1996.; Thought to be related to hemorrhoids; status post  hemorrhoidectomy  . Chronic back pain     multiple surgeries; C-spine and lumbar  . Diabetes mellitus    on oral medication  . Diverticulitis of colon 1996  . Diverticulosis   . Dyslipidemia, goal LDL below 70   . Erectile dysfunction   . Exertional dyspnea    Chronic baseline SOB with ambulation  . GERD (gastroesophageal reflux disease)   . H/O: pneumonia February 14  . Hemorrhoids   . Hiatal hernia   . History of: ST elevation myocardial infarction (STEMI) involving left circumflex coronary artery with complication 8469   PTCA-circumflex; PCI in 1991  . Hypertension   . Moderate aortic stenosis by prior echocardiogram 07/2015   Normal LV function - EF 55-60%.. Abnormal relaxation. Mild-moderate aortic stenosis (peak/mean gradient 18/10 mmH)  . Osteoarthritis of both knees    And back; multiple back surgeries, right knee  arthroplasty and left knee arthroscopic surgery x2  . Pneumonia 2016  . S/P AAA (abdominal aortic aneurysm) repair 08/30/2012   s/p EVAR  . S/P CABG x 2 1997   LIMA-LAD, SVG-OM  . Sleep apnea    pt. states he was told to return for f/u, to be fitted for Cpap, but pt. reports that he didn't follow up-no cpap used    Family History  Problem Relation Age of Onset  . Arthritis Mother   . Diabetes Father   . Heart disease Father   . Stroke Sister   . Hypertension Sister   . Heart disease Sister   . Diabetes Sister   . Breast cancer Sister   . Heart disease Brother   . Ulcers Brother   . Colon cancer Neg Hx     Past Surgical History:  Procedure Laterality Date  . Abdominal and Lower Extremity Arterial Ultrasound  08/23/2012; 10/10/2013   Normal ABIs. Nonocclusive lower extremity disease. 4.2 cm x 4.3 cm infrarenal AAA;; 4.4 cm x 4.3 cm (essentially stable)   . ABDOMINAL AORTIC ENDOVASCULAR STENT GRAFT N/A 08/13/2015   Procedure: ABDOMINAL AORTIC ENDOVASCULAR STENT GRAFT;  Surgeon: Mal Misty, MD;  Location: [facility];  Service: Vascular;  Laterality: N/A;   . Anterior cervical plating  04/23/10   At C4-5 and a C6-7 utilizing two separate Biomet MaxAn plates.  . ANTERIOR LAT LUMBAR FUSION Left 11/22/2012   Procedure: ANTERIOR LATERAL LUMBAR FUSION 1 LEVEL;  Surgeon: Eustace Moore, MD;  Location: [facility];  Service: Neurosurgery;  Laterality: Left;  Anterior Lateral Lumbar Fusion Lumbar Three-Four  . BACK SURGERY  1979 & x 10   pt. remarks, "I have had about 10 back surgeries"  . BILIARY STENT PLACEMENT N/A 07/03/2014   Procedure: BILIARY STENT PLACEMENT;  Surgeon: Inda Castle, MD;  Location: [facility];  Service: Endoscopy;  Laterality: N/A;  . CARDIAC CATHETERIZATION  September 2004   None Occluded vein graft to OM; diffuse RCA disease in the mid vessel, 80% circumflex-OM stenosis; follow on AV groove circumflex with sequential 90% stenoses and intervening saccular dilation   . CARDIAC CATHETERIZATION  March 2010   4 abnormal Myoview showing apical thinning (possibly due to apical LAD 95%) : 100% Occluded LAD after SV1, distal LAD grafted via LIMA -apical 95% . Cx -OM1 w/patent stent extending into OM 1 . Follow on Cx - 70-80% - non-amenable PCI. RCA widely patent 3 overlapping stents in mRCA w/less than 40% stenosisin RPL; SVG-OM known occluded   . CATARACT EXTRACTION    . Cervical arthrodesis  04/23/10   Anterior cervical arthrodesis, C4-5, C6-7 utilizing 7-mm PEEK interbody cage packed with local autograft & Antifuse putty at C4-5 & an 8-mm cage at C6-7.  Marland Kitchen CERVICAL DISCECTOMY  04/23/10   Decompressive anterior carvical diskectomy. C4-5, C6-7  . CHOLECYSTECTOMY N/A 05/23/2015   Procedure: LAPAROSCOPIC CHOLECYSTECTOMY WITH INTRAOPERATIVE CHOLANGIOGRAM;  Surgeon: Alphonsa Overall, MD;  Location: [facility];  Service: General;  Laterality: N/A;  . [place]  . CORONARY ANGIOPLASTY  1985,1991,15   1985 lateral STEMI Circumflex PTCA;   . CORONARY ARTERY BYPASS GRAFT  1997   LIMA-LAD, SVG-OM (SVG known to be occluded prior to 2004)  . ERCP N/A  07/03/2014   Procedure: ENDOSCOPIC RETROGRADE CHOLANGIOPANCREATOGRAPHY (ERCP);  Surgeon: Inda Castle, MD;  Location: [facility];  Service: Endoscopy;  Laterality: N/A;  . ERCP N/A 07/05/2014   Procedure: ENDOSCOPIC RETROGRADE CHOLANGIOPANCREATOGRAPHY (ERCP);  Surgeon: Inda Castle,  MD;  Location: [facility];  Service: Endoscopy;  Laterality: N/A;  . ERCP N/A 06/30/2015   Procedure: ENDOSCOPIC RETROGRADE CHOLANGIOPANCREATOGRAPHY (ERCP);  Surgeon: Ladene Artist, MD;  Location: [facility];  Service: Endoscopy;  Laterality: N/A;  . EYE SURGERY    . IR ANGIOGRAM EXTREMITY LEFT  01/18/2017  . IR ANGIOGRAM PELVIS SELECTIVE OR SUPRASELECTIVE  01/18/2017  . IR ANGIOGRAM SELECTIVE EACH ADDITIONAL VESSEL  01/18/2017  . IR AORTAGRAM ABDOMINAL SERIALOGRAM  01/18/2017  . IR EMBO ARTERIAL NOT HEMORR HEMANG INC GUIDE ROADMAPPING  01/18/2017  . IR RADIOLOGIST EVAL & MGMT  01/04/2017  . IR RADIOLOGIST EVAL & MGMT  10/26/2017  . IR RADIOLOGIST EVAL & MGMT  05/24/2018  . IR US GUIDE VASC ACCESS RIGHT  01/18/2017  . KNEE ARTHROSCOPY Left    x 2  . LEFT HEART CATH AND CORS/GRAFTS ANGIOGRAPHY N/A 04/20/2017   Procedure: Left Heart Cath and Cors/Grafts Angiography;  Surgeon: Lorretta Harp, MD;  [facility] INVASIVE CV LAB;  LAD now occluded prior to SP1.LIMA-LAD. Patent RCA and circumflex stents. EF 45-50%. Relatively stable.   Marland Kitchen LEFT HEART CATHETERIZATION WITH CORONARY ANGIOGRAM N/A 05/15/2014   Procedure: LEFT HEART CATHETERIZATION WITH CORONARY ANGIOGRAM;  Surgeon: Sinclair Grooms, MD;  Location: [facility] CATH LAB;  Service: Cardiovascular;  Laterality: N/A;  . LEFT HEART CATHETERIZATION WITH CORONARY/GRAFT ANGIOGRAM N/A 06/28/2014   Procedure: LEFT HEART CATHETERIZATION WITH Beatrix Fetters;  Surgeon: Troy Sine, MD;  Location[facility] LAB;  Service: Cardiovascular;  Laterality: N/A;  . LUMBAR PERCUTANEOUS PEDICLE SCREW 1 LEVEL N/A 11/22/2012   Procedure: LUMBAR PERCUTANEOUS PEDICLE SCREW 1 LEVEL;   Surgeon: Eustace Moore, MD;  Location: [facility];  Service: Neurosurgery;  Laterality: N/A;  Lumbar Three-Four Percutaneous Pedicle Screw, Lateral approach  . MINOR HEMORRHOIDECTOMY    . NM MYOVIEW LTD  December 2013   LOW RISK. Mmoderate region of mid to basal inferolateral scar without ischemia. Mild apical hypokinesis with an EF of 47%.  Marland Kitchen PERCUTANEOUS CORONARY STENT INTERVENTION (PCI-S)  September 2004   PCI - RCA 2 overlapping Taxus DES 2.75 mm x 32 mm and 2.75 mm x 12 mm (3.0 mm); PCI-Cx-OM1 - Taxus DES 3.0 mm x 20 mm (3.1 mm);   Marland Kitchen PERCUTANEOUS CORONARY STENT INTERVENTION (PCI-S)  12/2005   80% ISR in proximal Taxus stent in RCA -- covered proximally with Cypher DES 3.0 mm x 12 mm  . PERCUTANEOUS CORONARY STENT INTERVENTION (PCI-S) N/A 05/17/2014   Procedure: PERCUTANEOUS CORONARY STENT INTERVENTION (PCI-S);  Surgeon: Sinclair Grooms, MD;  Location: [facility] CATH LAB: PCI pRCA stent ISR - Promus DES 3.0 x 8 (3.25), OM distal stent edge - Promus DES 2.75 x 12 (3.1)  . PERIPHERAL VASCULAR CATHETERIZATION  11/03/2016   Procedure: Embolization;  Surgeon: Waynetta Sandy, MD;  Location: [facility] CV LAB;  Service: Cardiovascular;;  . POSTERIOR CERVICAL FUSION/FORAMINOTOMY N/A 05/02/2013   Procedure: POSTERIOR CERVICAL FUSION/FORAMINOTOMY CERVICAL SEVEN THORACIC-ONE;  Surgeon: Eustace Moore, MD;  Location: [facility] [facility];  Service: Neurosurgery;  Laterality: N/A;  POSTERIOR CERVICAL FUSION/FORAMINOTOMY CERVICAL SEVEN THORACIC-ONE  . SPHINCTEROTOMY N/A 06/30/2015   Procedure: SPHINCTEROTOMY;  Surgeon: Ladene Artist, MD;  Location: [facility];  Service: Endoscopy;  Laterality: N/A;  . TOTAL KNEE ARTHROPLASTY Right   . TOTAL KNEE ARTHROPLASTY Left 12/20/2016   Procedure: LEFT TOTAL KNEE ARTHROPLASTY;  Surgeon: Vickey Huger, MD;  Location: [facility];  Service: Orthopedics;  Laterality: Left;  . TRANSTHORACIC ECHOCARDIOGRAM  December 2013; October 2016   a. EF 55-60%. mod LA dilation. Aortic  Sclerosis;; b. EF 55-60%, Gr 1 DD, Mild-Mod AS (Peark - Mean Gradient 18-10 mmHg)  . TRANSTHORACIC ECHOCARDIOGRAM  07/2016   EF 45-50%. Inferolateral hypokinesis (correlates with scar noted on Myoview). GR 1 DD. Mild aortic stenosis. Mod LAE.  Marland Kitchen VISCERAL ANGIOGRAM  11/03/2016   Procedure: Visceral Angiogram;  Surgeon: Waynetta Sandy, MD;  Location: [facility] CV LAB;  Service: Cardiovascular;;   Social History   Occupational History  . Not on file  Tobacco Use  . Smoking status: Former Smoker    Packs/day: 3.00    Years: 45.00    Pack years: 135.00    Types: Cigarettes    Quit date: 10/05/1983    Years since quitting: 35.4  . Smokeless tobacco: Never Used  Substance and Sexual Activity  . Alcohol use: Yes    Alcohol/week: 4.0 standard drinks    Types: 4 Glasses of wine per week    Comment:  4 oz. wine daily  . Drug use: No  . Sexual activity: Not Currently

## 2019-04-04 ENCOUNTER — Other Ambulatory Visit: Payer: Self-pay

## 2019-04-04 ENCOUNTER — Encounter: Payer: Self-pay | Admitting: Orthopaedic Surgery

## 2019-04-04 ENCOUNTER — Ambulatory Visit (INDEPENDENT_AMBULATORY_CARE_PROVIDER_SITE_OTHER): Payer: Medicare Other | Admitting: Orthopaedic Surgery

## 2019-04-04 DIAGNOSIS — M79641 Pain in right hand: Secondary | ICD-10-CM

## 2019-04-04 DIAGNOSIS — R200 Anesthesia of skin: Secondary | ICD-10-CM

## 2019-04-04 MED ORDER — LIDOCAINE HCL 1 % IJ SOLN
1.0000 mL | INTRAMUSCULAR | Status: AC | PRN
  Administered 2019-04-04: 1 mL

## 2019-04-04 MED ORDER — METHYLPREDNISOLONE ACETATE 40 MG/ML IJ SUSP
40.0000 mg | INTRAMUSCULAR | Status: AC | PRN
  Administered 2019-04-04: 13:00:00 40 mg

## 2019-04-04 NOTE — Progress Notes (Signed)
Office Visit Note   Patient: [name]           Date of Birth: 11-30-1935           MRN: [id number] Visit Date: 04/04/2019              Requested by: Denita Lung, MD [place],  [place] [address] PCP: Denita Lung, MD   Assessment & Plan: Visit Diagnoses:  1. Numbness of right hand     Plan: I did offer a steroid injection in the transverse carpal ligament area of his right hand and he agreed to this and tolerated it well.  He knows to watch his blood glucose closely given steroids and packs on blood sugars.  I do feel is appropriate to obtain nerve conduction studies of the right upper extremity to help better ascertain what her diagnosis is.  All question concerns were answered addressed.  We will see him back after the nerve studies are performed.  Follow-Up Instructions: Return in about 2 weeks (around 04/18/2019).   Orders:  Orders Placed This Encounter  Procedures  . Hand/UE Inj   No orders of the defined types were placed in this encounter.     Procedures: Hand/UE Inj: R carpal tunnel for carpal tunnel syndrome on 04/04/2019 1:08 PM Medications: 1 mL lidocaine 1 %; 40 mg methylPREDNISolone acetate 40 MG/ML      Clinical Data: No additional findings.   Subjective: Chief Complaint  Patient presents with  . Right Hip - Follow-up  The patient comes in 2 weeks after we injected his right trochanteric area for bursitis.  He says this helped only for a couple days but his biggest complaint is weakness in his right dominant hand and numbness and tingling.  He is 83 years old he is on blood thinning medication.  He is a diabetic but under good control.  He has had at least 3 cervical spine surgeries as well.  He says that hand is weak and has numbness and tingling is been worsening for a few weeks now.  HPI  Review of Systems He currently denies any headache, chest pain, shortness of breath, fever, chills, nausea, vomiting  Objective:  Vital Signs: There were no vitals taken for this visit.  Physical Exam He is alert and orient x3 and in no acute distress Ortho Exam Examination of both hands shows weak pinch and grip strength.  He does have numbness globally around his hand more but seems to be in the median nerve distribution.  He does have irritation to palpation and percussion over the median nerve.  He can tell he is a previous elbow trauma and he has a proximal biceps tendon rupture that is chronic.  Otherwise he is getting pretty good function of his hand.  Is not as sensitive there is any muscle atrophy. Specialty Comments:  No specialty comments available.  Imaging: No results found.   PMFS History: Patient Active Problem List   Diagnosis Date Noted  . Senile purpura ([place]) 07/20/2017  . S/P total knee replacement 12/20/2016  . Chronic pain of left knee 10/19/2016  . Endoleak post (EVAR) endovascular aneurysm repair ([place]) 03/16/2016  . Mild aortic stenosis by prior echocardiogram 02/02/2016  . Statin intolerance 09/30/2015  . Calculus of bile duct with obstruction and without cholangitis or cholecystitis   . Obstruction of biliary stent   . Presence of drug coated stent in right coronary artery and circumflex coronary artery 01/08/2015  .  Chronic renal insufficiency, stage III (moderate) ([place]) 06/06/2014  . Coronary stent restenosis with uncertain cause: Required PCI for ISR of OM1 (Promus P 2.75 mm x 12 mm) & pRCA 05/17/2014  . Obesity (BMI 30-39.9) 06/28/2013  . Encounter for long-term (current) use of medications 06/28/2013  . ACE-inhibitor cough 04/16/2013  . Chronic diastolic CHF (congestive heart failure) ([place]) 11/24/2012  . Type II diabetes mellitus with complication -- CAD, AAA 03/04/2011    Class: Diagnosis of  . Hypertensive heart disease with chronic diastolic congestive heart failure ([place]) 03/04/2011  . Hyperlipidemia LDL goal <70; statin intolerant 03/04/2011    Class: Diagnosis of  . ED  (erectile dysfunction) of organic origin 03/04/2011  . GERD (gastroesophageal reflux disease) 03/04/2011  . S/P CABG x 2: 1997. SVG-OM (known to be occluded), LIMA-LAD 06/28/1996  . History of ST elevation myocardial infarction (STEMI) of inferolateral wall (PTCA - 100% large lateral OM) 06/28/1996    Class: History of  . CAD S/P CABG '95- several PCis since, last 05/17/14 06/28/1994    Class: History of   Past Medical History:  Diagnosis Date  . Adenomatous colon polyp   . Ankle edema    Chronic  . Asthma   . CAD S/P percutaneous coronary angioplasty 06/2003, 12/2005, 05/2014   a) '04: Staged Taxus DES PCI to RCA (2 Taxus 2.75 x 32 & 12) and Cx-OM (Taxus 3 x 20);;'07 - PCI to pRCA ISR- Cypher DES; c) 05/2014: pPCI pRCA stentISR (Promus DES 3 x 8) OM1 distal to stent (Promus DES 2.75 x 12 - 3.1)  . CAD, multiple vessel 1985   Most recent July 2018: Admitted for non-STEMI. Occluded LAD with patent LIMA (SP1 now occluded).  Patent stents in proximal and mid RCA as well as circumflex-OM 3. Known occlusion of SVG-OM. EF 45-50%  . Cholelithiasis - with cholangitis & choledocholithiasis    status post ERCP with removal of calculi and biliary stent placement.  . Chronic anemia    On iron supplement; history of positive guaiac - negative colonoscopy in 1996.; Thought to be related to hemorrhoids; status post hemorrhoidectomy  . Chronic back pain     multiple surgeries; C-spine and lumbar  . Diabetes mellitus    on oral medication  . Diverticulitis of colon 1996  . Diverticulosis   . Dyslipidemia, goal LDL below 70   . Erectile dysfunction   . Exertional dyspnea    Chronic baseline SOB with ambulation  . GERD (gastroesophageal reflux disease)   . H/O: pneumonia February 14  . Hemorrhoids   . Hiatal hernia   . History of: ST elevation myocardial infarction (STEMI) involving left circumflex coronary artery with complication 1610   PTCA-circumflex; PCI in 1991  . Hypertension   . Moderate  aortic stenosis by prior echocardiogram 07/2015   Normal LV function - EF 55-60%.. Abnormal relaxation. Mild-moderate aortic stenosis (peak/mean gradient 18/10 mmH)  . Osteoarthritis of both knees    And back; multiple back surgeries, right knee arthroplasty and left knee arthroscopic surgery x2  . Pneumonia 2016  . S/P AAA (abdominal aortic aneurysm) repair 08/30/2012   s/p EVAR  . S/P CABG x 2 1997   LIMA-LAD, SVG-OM  . Sleep apnea    pt. states he was told to return for f/u, to be fitted for Cpap, but pt. reports that he didn't follow up-no cpap used    Family History  Problem Relation Age of Onset  . Arthritis Mother   . Diabetes Father   .  Heart disease Father   . Stroke Sister   . Hypertension Sister   . Heart disease Sister   . Diabetes Sister   . Breast cancer Sister   . Heart disease Brother   . Ulcers Brother   . Colon cancer Neg Hx     Past Surgical History:  Procedure Laterality Date  . Abdominal and Lower Extremity Arterial Ultrasound  08/23/2012; 10/10/2013   Normal ABIs. Nonocclusive lower extremity disease. 4.2 cm x 4.3 cm infrarenal AAA;; 4.4 cm x 4.3 cm (essentially stable)   . ABDOMINAL AORTIC ENDOVASCULAR STENT GRAFT N/A 08/13/2015   Procedure: ABDOMINAL AORTIC ENDOVASCULAR STENT GRAFT;  Surgeon: Mal Misty, MD;  Location: [facility];  Service: Vascular;  Laterality: N/A;  . Anterior cervical plating  04/23/10   At C4-5 and a C6-7 utilizing two separate Biomet MaxAn plates.  . ANTERIOR LAT LUMBAR FUSION Left 11/22/2012   Procedure: ANTERIOR LATERAL LUMBAR FUSION 1 LEVEL;  Surgeon: Eustace Moore, MD;  Location: [facility];  Service: Neurosurgery;  Laterality: Left;  Anterior Lateral Lumbar Fusion Lumbar Three-Four  . BACK SURGERY  1979 & x 10   pt. remarks, "I have had about 10 back surgeries"  . BILIARY STENT PLACEMENT N/A 07/03/2014   Procedure: BILIARY STENT PLACEMENT;  Surgeon: Inda Castle, MD;  Location: [facility];  Service: Endoscopy;  Laterality:  N/A;  . CARDIAC CATHETERIZATION  September 2004   None Occluded vein graft to OM; diffuse RCA disease in the mid vessel, 80% circumflex-OM stenosis; follow on AV groove circumflex with sequential 90% stenoses and intervening saccular dilation   . CARDIAC CATHETERIZATION  March 2010   4 abnormal Myoview showing apical thinning (possibly due to apical LAD 95%) : 100% Occluded LAD after SV1, distal LAD grafted via LIMA -apical 95% . Cx -OM1 w/patent stent extending into OM 1 . Follow on Cx - 70-80% - non-amenable PCI. RCA widely patent 3 overlapping stents in mRCA w/less than 40% stenosisin RPL; SVG-OM known occluded   . CATARACT EXTRACTION    . Cervical arthrodesis  04/23/10   Anterior cervical arthrodesis, C4-5, C6-7 utilizing 7-mm PEEK interbody cage packed with local autograft & Antifuse putty at C4-5 & an 8-mm cage at C6-7.  Marland Kitchen CERVICAL DISCECTOMY  04/23/10   Decompressive anterior carvical diskectomy. C4-5, C6-7  . CHOLECYSTECTOMY N/A 05/23/2015   Procedure: LAPAROSCOPIC CHOLECYSTECTOMY WITH INTRAOPERATIVE CHOLANGIOGRAM;  Surgeon: Alphonsa Overall, MD;  Location: [facility];  Service: General;  Laterality: N/A;  . [place]  . CORONARY ANGIOPLASTY  1985,1991,15   1985 lateral STEMI Circumflex PTCA;   . CORONARY ARTERY BYPASS GRAFT  1997   LIMA-LAD, SVG-OM (SVG known to be occluded prior to 2004)  . ERCP N/A 07/03/2014   Procedure: ENDOSCOPIC RETROGRADE CHOLANGIOPANCREATOGRAPHY (ERCP);  Surgeon: Inda Castle, MD;  Location: [facility];  Service: Endoscopy;  Laterality: N/A;  . ERCP N/A 07/05/2014   Procedure: ENDOSCOPIC RETROGRADE CHOLANGIOPANCREATOGRAPHY (ERCP);  Surgeon: Inda Castle, MD;  Location: [facility];  Service: Endoscopy;  Laterality: N/A;  . ERCP N/A 06/30/2015   Procedure: ENDOSCOPIC RETROGRADE CHOLANGIOPANCREATOGRAPHY (ERCP);  Surgeon: Ladene Artist, MD;  Location: [facility];  Service: Endoscopy;  Laterality: N/A;  . EYE SURGERY    . IR ANGIOGRAM EXTREMITY LEFT   01/18/2017  . IR ANGIOGRAM PELVIS SELECTIVE OR SUPRASELECTIVE  01/18/2017  . IR ANGIOGRAM SELECTIVE EACH ADDITIONAL VESSEL  01/18/2017  . IR AORTAGRAM ABDOMINAL SERIALOGRAM  01/18/2017  . IR EMBO ARTERIAL NOT HEMORR HEMANG  INC GUIDE ROADMAPPING  01/18/2017  . IR RADIOLOGIST EVAL & MGMT  01/04/2017  . IR RADIOLOGIST EVAL & MGMT  10/26/2017  . IR RADIOLOGIST EVAL & MGMT  05/24/2018  . IR US GUIDE VASC ACCESS RIGHT  01/18/2017  . KNEE ARTHROSCOPY Left    x 2  . LEFT HEART CATH AND CORS/GRAFTS ANGIOGRAPHY N/A 04/20/2017   Procedure: Left Heart Cath and Cors/Grafts Angiography;  Surgeon: Lorretta Harp, MD;  Location: [facility] INVASIVE CV LAB;  LAD now occluded prior to SP1.LIMA-LAD. Patent RCA and circumflex stents. EF 45-50%. Relatively stable.   Marland Kitchen LEFT HEART CATHETERIZATION WITH CORONARY ANGIOGRAM N/A 05/15/2014   Procedure: LEFT HEART CATHETERIZATION WITH CORONARY ANGIOGRAM;  Surgeon: Sinclair Grooms, MD;  Location: [facility] CATH LAB;  Service: Cardiovascular;  Laterality: N/A;  . LEFT HEART CATHETERIZATION WITH CORONARY/GRAFT ANGIOGRAM N/A 06/28/2014   Procedure: LEFT HEART CATHETERIZATION WITH Beatrix Fetters;  Surgeon: Troy Sine, MD;  Location: [facility] CATH LAB;  Service: Cardiovascular;  Laterality: N/A;  . LUMBAR PERCUTANEOUS PEDICLE SCREW 1 LEVEL N/A 11/22/2012   Procedure: LUMBAR PERCUTANEOUS PEDICLE SCREW 1 LEVEL;  Surgeon: Eustace Moore, MD;  Location: [facility];  Service: Neurosurgery;  Laterality: N/A;  Lumbar Three-Four Percutaneous Pedicle Screw, Lateral approach  . MINOR HEMORRHOIDECTOMY    . NM MYOVIEW LTD  December 2013   LOW RISK. Mmoderate region of mid to basal inferolateral scar without ischemia. Mild apical hypokinesis with an EF of 47%.  Marland Kitchen PERCUTANEOUS CORONARY STENT INTERVENTION (PCI-S)  September 2004   PCI - RCA 2 overlapping Taxus DES 2.75 mm x 32 mm and 2.75 mm x 12 mm (3.0 mm); PCI-Cx-OM1 - Taxus DES 3.0 mm x 20 mm (3.1 mm);   Marland Kitchen PERCUTANEOUS CORONARY STENT INTERVENTION  (PCI-S)  12/2005   80% ISR in proximal Taxus stent in RCA -- covered proximally with Cypher DES 3.0 mm x 12 mm  . PERCUTANEOUS CORONARY STENT INTERVENTION (PCI-S) N/A 05/17/2014   Procedure: PERCUTANEOUS CORONARY STENT INTERVENTION (PCI-S);  Surgeon: Sinclair Grooms, MD;  Location: [facility] CATH LAB: PCI pRCA stent ISR - Promus DES 3.0 x 8 (3.25), OM distal stent edge - Promus DES 2.75 x 12 (3.1)  . PERIPHERAL VASCULAR CATHETERIZATION  11/03/2016   Procedure: Embolization;  Surgeon: Waynetta Sandy, MD;  Location: [facility] CV LAB;  Service: Cardiovascular;;  . POSTERIOR CERVICAL FUSION/FORAMINOTOMY N/A 05/02/2013   Procedure: POSTERIOR CERVICAL FUSION/FORAMINOTOMY CERVICAL SEVEN THORACIC-ONE;  Surgeon: Eustace Moore, MD;  Location: [facility];  Service: Neurosurgery;  Laterality: N/A;  POSTERIOR CERVICAL FUSION/FORAMINOTOMY CERVICAL SEVEN THORACIC-ONE  . SPHINCTEROTOMY N/A 06/30/2015   Procedure: SPHINCTEROTOMY;  Surgeon: Ladene Artist, MD;  Location: [facility];  Service: Endoscopy;  Laterality: N/A;  . TOTAL KNEE ARTHROPLASTY Right   . TOTAL KNEE ARTHROPLASTY Left 12/20/2016   Procedure: LEFT TOTAL KNEE ARTHROPLASTY;  Surgeon: Vickey Huger, MD;  Location: [facility];  Service: Orthopedics;  Laterality: Left;  . TRANSTHORACIC ECHOCARDIOGRAM  December 2013; October 2016   a. EF 55-60%. mod LA dilation. Aortic Sclerosis;; b. EF 55-60%, Gr 1 DD, Mild-Mod AS (Peark - Mean Gradient 18-10 mmHg)  . TRANSTHORACIC ECHOCARDIOGRAM  07/2016   EF 45-50%. Inferolateral hypokinesis (correlates with scar noted on Myoview). GR 1 DD. Mild aortic stenosis. Mod LAE.  Marland Kitchen VISCERAL ANGIOGRAM  11/03/2016   Procedure: Visceral Angiogram;  Surgeon: Waynetta Sandy, MD;  Location: [facility] CV LAB;  Service: Cardiovascular;;   Social History   Occupational History  .  Not on file  Tobacco Use  . Smoking status: Former Smoker    Packs/day: 3.00    Years: 45.00    Pack years: 135.00    Types: Cigarettes     Quit date: 10/05/1983    Years since quitting: 35.5  . Smokeless tobacco: Never Used  Substance and Sexual Activity  . Alcohol use: Yes    Alcohol/week: 4.0 standard drinks    Types: 4 Glasses of wine per week    Comment:  4 oz. wine daily  . Drug use: No  . Sexual activity: Not Currently

## 2019-04-09 ENCOUNTER — Other Ambulatory Visit: Payer: Self-pay | Admitting: Cardiology

## 2019-04-18 ENCOUNTER — Ambulatory Visit: Payer: Medicare Other | Admitting: Orthopaedic Surgery

## 2019-04-25 ENCOUNTER — Other Ambulatory Visit: Payer: Self-pay

## 2019-04-25 ENCOUNTER — Ambulatory Visit (INDEPENDENT_AMBULATORY_CARE_PROVIDER_SITE_OTHER): Payer: Medicare Other | Admitting: Physical Medicine and Rehabilitation

## 2019-04-25 DIAGNOSIS — R202 Paresthesia of skin: Secondary | ICD-10-CM

## 2019-04-25 NOTE — Progress Notes (Signed)
 .  Numeric Pain Rating Scale and Functional Assessment Average Pain 0   In the last MONTH (on 0-10 scale) has pain interfered with the following?  1. General activity like being  able to carry out your everyday physical activities such as walking, climbing stairs, carrying groceries, or moving a chair?  Rating(6)    

## 2019-04-30 ENCOUNTER — Ambulatory Visit (INDEPENDENT_AMBULATORY_CARE_PROVIDER_SITE_OTHER): Payer: Medicare Other | Admitting: Orthopaedic Surgery

## 2019-04-30 ENCOUNTER — Other Ambulatory Visit: Payer: Self-pay

## 2019-04-30 ENCOUNTER — Encounter: Payer: Self-pay | Admitting: Orthopaedic Surgery

## 2019-04-30 DIAGNOSIS — G5602 Carpal tunnel syndrome, left upper limb: Secondary | ICD-10-CM | POA: Diagnosis not present

## 2019-04-30 DIAGNOSIS — G5601 Carpal tunnel syndrome, right upper limb: Secondary | ICD-10-CM

## 2019-04-30 NOTE — Progress Notes (Signed)
[name] - 83 y.o. male MRN [id number]  Date of birth: 1935/11/14  Office Visit Note: Visit Date: 04/25/2019 PCP: Denita Lung, MD Referred by: Denita Lung, MD  Subjective: Chief Complaint  Patient presents with  . Right Forearm - Numbness, Tingling  . Left Hand - Tingling, Numbness  . Right Hand - Numbness, Tingling   HPI:  [name] is a 83 y.o. male who comes in today At the request of Dr. Jean Rosenthal for electrodiagnostic study of the right upper limb.  Patient is right-hand dominant and reports 5 weeks of onset of pretty severe numbness and tingling in the right forearm and right hand that he feels is mostly global with all the fingers involved.  He does get some left-sided symptoms as well as not as much as the right.  He reports worsening with using the hands and at night.  He has not had prior electrodiagnostic studies.  He has not had prior carpal tunnel release.  He had had recent carpal tunnel injection by Dr. Ninfa Linden on July 1 and this gave him significant relief of his major pain in the right wrist and hand.  He denies any frank radicular pain.  The patient has had multiple cervical and lumbar surgeries and has had both cervical and lumbar fusion.  He has had bilateral total knee replacements.  He is a diabetic with significant heart disease status post multiple stents and CABG.  ROS Otherwise per HPI.  Assessment & Plan: Visit Diagnoses:  1. Paresthesia of skin     Plan: Impression: The above electrodiagnostic study is ABNORMAL and reveals evidence of"  1.  A RIGHT moderate to severe median nerve entrapment at the wrist (carpal tunnel syndrome) affecting sensory and motor components.   2.  A left moderate bilateral median nerve entrapment at the wrist (carpal tunnel syndrome) affecting sensory and motor components.   There is no significant electrodiagnostic evidence of any other focal nerve entrapment, brachial plexopathy, cervical  radiculopathy or generalized peripheral neuropathy.   Recommendations: 1.  Follow-up with referring physician. 2.  Continue current management of symptoms. 3.  Continue use of resting splint at night-time and as needed during the day. 4.  Suggest surgical evaluation.    Meds & Orders: No orders of the defined types were placed in this encounter.   Orders Placed This Encounter  Procedures  . NCV with EMG (electromyography)    Follow-up: Return for Jean Rosenthal, M.D..   Procedures: No procedures performed  EMG & NCV Findings: Evaluation of the left median motor and the right median motor nerves showed prolonged distal onset latency (L4.8, R5.7 ms), reduced amplitude (L4.8, R4.7 mV), and decreased conduction velocity (Elbow-Wrist, L48, R42 m/s).  The left median (across palm) sensory nerve showed prolonged distal peak latency (Wrist, 4.8 ms) and prolonged distal peak latency (Palm, 2.8 ms).  The right median (across palm) sensory nerve showed prolonged distal peak latency (Wrist, 6.2 ms), reduced amplitude (8.9 V), and prolonged distal peak latency (Palm, 5.9 ms).  The right ulnar sensory nerve showed reduced amplitude (9.3 V).  All remaining nerves (as indicated in the following tables) were within normal limits.  Left vs. Right side comparison data for the median motor nerve indicates abnormal L-R latency difference (0.9 ms).    All examined muscles (as indicated in the following table) showed no evidence of electrical instability.    Impression: The above electrodiagnostic study is ABNORMAL and reveals evidence of"  1.  A RIGHT moderate to severe median nerve entrapment at the wrist (carpal tunnel syndrome) affecting sensory and motor components.   2.  A left moderate bilateral median nerve entrapment at the wrist (carpal tunnel syndrome) affecting sensory and motor components.   There is no significant electrodiagnostic evidence of any other focal nerve entrapment, brachial  plexopathy, cervical radiculopathy or generalized peripheral neuropathy.   Recommendations: 1.  Follow-up with referring physician. 2.  Continue current management of symptoms. 3.  Continue use of resting splint at night-time and as needed during the day. 4.  Suggest surgical evaluation.  ___________________________ Laurence Spates FAAPMR Board Certified, American Board of Physical Medicine and Rehabilitation    Nerve Conduction Studies Anti Sensory Summary Table   Stim Site NR Peak (ms) Norm Peak (ms) P-T Amp (V) Norm P-T Amp Site1 Site2 Delta-P (ms) Dist (cm) Vel (m/s) Norm Vel (m/s)  Left Median Acr Palm Anti Sensory (2nd Digit)  32.4C  Wrist    *4.8 <3.6 10.7 >10 Wrist Palm 2.0 0.0    Palm    *2.8 <2.0 1.2         Right Median Acr Palm Anti Sensory (2nd Digit)  31.5C  Wrist    *6.2 <3.6 *8.9 >10 Wrist Palm 0.3 0.0    Palm    *5.9 <2.0 4.7         Right Radial Anti Sensory (Base 1st Digit)  31C  Wrist    2.4 <3.1 8.8  Wrist Base 1st Digit 2.4 0.0    Right Ulnar Anti Sensory (5th Digit)  31.4C  Wrist    3.6 <3.7 *9.3 >15.0 Wrist 5th Digit 3.6 14.0 39 >38   Motor Summary Table   Stim Site NR Onset (ms) Norm Onset (ms) O-P Amp (mV) Norm O-P Amp Site1 Site2 Delta-0 (ms) Dist (cm) Vel (m/s) Norm Vel (m/s)  Left Median Motor (Abd Poll Brev)  32.8C  Wrist    *4.8 <4.2 *4.8 >5 Elbow Wrist 4.9 23.5 *48 >50  Elbow    9.7  4.6         Right Median Motor (Abd Poll Brev)  31.1C  Wrist    *5.7 <4.2 *4.7 >5 Elbow Wrist 5.7 24.0 *42 >50  Elbow    11.4  4.3         Right Ulnar Motor (Abd Dig Min)  31.1C  Wrist    3.2 <4.2 10.4 >3 B Elbow Wrist 3.8 22.0 58 >53  B Elbow    7.0  9.7  A Elbow B Elbow 1.7 10.0 59 >53  A Elbow    8.7  9.3          EMG   Side Muscle Nerve Root Ins Act Fibs Psw Amp Dur Poly Recrt Int Fraser Din Comment  Right Abd Poll Brev Median C8-T1 Nml Nml Nml Nml Nml 0 Nml Nml   Right 1stDorInt Ulnar C8-T1 Nml Nml Nml Nml Nml 0 Nml Nml   Right PronatorTeres Median C6-7  Nml Nml Nml Nml Nml 0 Nml Nml   Right Biceps Musculocut C5-6 Nml Nml Nml Nml Nml 0 Nml Nml   Right Deltoid Axillary C5-6 Nml Nml Nml Nml Nml 0 Nml Nml     Nerve Conduction Studies Anti Sensory Left/Right Comparison   Stim Site L Lat (ms) R Lat (ms) L-R Lat (ms) L Amp (V) R Amp (V) L-R Amp (%) Site1 Site2 L Vel (m/s) R Vel (m/s) L-R Vel (m/s)  Median Acr Palm Anti Sensory (2nd Digit)  32.4C  Wrist *4.8 *6.2 1.4 10.7 *8.9 16.8 Wrist Palm     Palm *2.8 *5.9 3.1 1.2 4.7 74.5       Radial Anti Sensory (Base 1st Digit)  31C  Wrist  2.4   8.8  Wrist Base 1st Digit     Ulnar Anti Sensory (5th Digit)  31.4C  Wrist  3.6   *9.3  Wrist 5th Digit  39    Motor Left/Right Comparison   Stim Site L Lat (ms) R Lat (ms) L-R Lat (ms) L Amp (mV) R Amp (mV) L-R Amp (%) Site1 Site2 L Vel (m/s) R Vel (m/s) L-R Vel (m/s)  Median Motor (Abd Poll Brev)  32.8C  Wrist *4.8 *5.7 *0.9 *4.8 *4.7 2.1 Elbow Wrist *48 *42 6  Elbow 9.7 11.4 1.7 4.6 4.3 6.5       Ulnar Motor (Abd Dig Min)  31.1C  Wrist  3.2   10.4  B Elbow Wrist  58   B Elbow  7.0   9.7  A Elbow B Elbow  59   A Elbow  8.7   9.3           Waveforms:                Clinical History: No specialty comments available.     Objective:  VS:  HT:    WT:   BMI:     BP:   HR: bpm  TEMP: ( )  RESP:  Physical Exam Musculoskeletal:        General: No tenderness.     Comments: Inspection reveals no atrophy of the bilateral APB or FDI or hand intrinsics. There is no swelling, color changes, allodynia or dystrophic changes. There is 5 out of 5 strength in the bilateral wrist extension, finger abduction and long finger flexion. There is intact sensation to light touch in all dermatomal and peripheral nerve distributions. There is a negative Hoffmann's test bilaterally.  Skin:    General: Skin is warm and dry.     Findings: No erythema or rash.  Neurological:     General: No focal deficit present.     Mental Status: He is alert and  oriented to person, place, and time.     Sensory: No sensory deficit.     Motor: No weakness or abnormal muscle tone.     Coordination: Coordination normal.     Gait: Gait normal.  Psychiatric:        Mood and Affect: Mood normal.        Behavior: Behavior normal.        Thought Content: Thought content normal.     Ortho Exam Imaging: No results found.

## 2019-04-30 NOTE — Procedures (Signed)
EMG & NCV Findings: Evaluation of the left median motor and the right median motor nerves showed prolonged distal onset latency (L4.8, R5.7 ms), reduced amplitude (L4.8, R4.7 mV), and decreased conduction velocity (Elbow-Wrist, L48, R42 m/s).  The left median (across palm) sensory nerve showed prolonged distal peak latency (Wrist, 4.8 ms) and prolonged distal peak latency (Palm, 2.8 ms).  The right median (across palm) sensory nerve showed prolonged distal peak latency (Wrist, 6.2 ms), reduced amplitude (8.9 V), and prolonged distal peak latency (Palm, 5.9 ms).  The right ulnar sensory nerve showed reduced amplitude (9.3 V).  All remaining nerves (as indicated in the following tables) were within normal limits.  Left vs. Right side comparison data for the median motor nerve indicates abnormal L-R latency difference (0.9 ms).    All examined muscles (as indicated in the following table) showed no evidence of electrical instability.    Impression: The above electrodiagnostic study is ABNORMAL and reveals evidence of"  1.  A RIGHT moderate to severe median nerve entrapment at the wrist (carpal tunnel syndrome) affecting sensory and motor components.   2.  A left moderate bilateral median nerve entrapment at the wrist (carpal tunnel syndrome) affecting sensory and motor components.   There is no significant electrodiagnostic evidence of any other focal nerve entrapment, brachial plexopathy, cervical radiculopathy or generalized peripheral neuropathy.   Recommendations: 1.  Follow-up with referring physician. 2.  Continue current management of symptoms. 3.  Continue use of resting splint at night-time and as needed during the day. 4.  Suggest surgical evaluation.  ___________________________ Laurence Spates FAAPMR Board Certified, American Board of Physical Medicine and Rehabilitation    Nerve Conduction Studies Anti Sensory Summary Table   Stim Site NR Peak (ms) Norm Peak (ms) P-T Amp (V)  Norm P-T Amp Site1 Site2 Delta-P (ms) Dist (cm) Vel (m/s) Norm Vel (m/s)  Left Median Acr Palm Anti Sensory (2nd Digit)  32.4C  Wrist    *4.8 <3.6 10.7 >10 Wrist Palm 2.0 0.0    Palm    *2.8 <2.0 1.2         Right Median Acr Palm Anti Sensory (2nd Digit)  31.5C  Wrist    *6.2 <3.6 *8.9 >10 Wrist Palm 0.3 0.0    Palm    *5.9 <2.0 4.7         Right Radial Anti Sensory (Base 1st Digit)  31C  Wrist    2.4 <3.1 8.8  Wrist Base 1st Digit 2.4 0.0    Right Ulnar Anti Sensory (5th Digit)  31.4C  Wrist    3.6 <3.7 *9.3 >15.0 Wrist 5th Digit 3.6 14.0 39 >38   Motor Summary Table   Stim Site NR Onset (ms) Norm Onset (ms) O-P Amp (mV) Norm O-P Amp Site1 Site2 Delta-0 (ms) Dist (cm) Vel (m/s) Norm Vel (m/s)  Left Median Motor (Abd Poll Brev)  32.8C  Wrist    *4.8 <4.2 *4.8 >5 Elbow Wrist 4.9 23.5 *48 >50  Elbow    9.7  4.6         Right Median Motor (Abd Poll Brev)  31.1C  Wrist    *5.7 <4.2 *4.7 >5 Elbow Wrist 5.7 24.0 *42 >50  Elbow    11.4  4.3         Right Ulnar Motor (Abd Dig Min)  31.1C  Wrist    3.2 <4.2 10.4 >3 B Elbow Wrist 3.8 22.0 58 >53  B Elbow    7.0  9.7  A Elbow  B Elbow 1.7 10.0 59 >53  A Elbow    8.7  9.3          EMG   Side Muscle Nerve Root Ins Act Fibs Psw Amp Dur Poly Recrt Int Fraser Din Comment  Right Abd Poll Brev Median C8-T1 Nml Nml Nml Nml Nml 0 Nml Nml   Right 1stDorInt Ulnar C8-T1 Nml Nml Nml Nml Nml 0 Nml Nml   Right PronatorTeres Median C6-7 Nml Nml Nml Nml Nml 0 Nml Nml   Right Biceps Musculocut C5-6 Nml Nml Nml Nml Nml 0 Nml Nml   Right Deltoid Axillary C5-6 Nml Nml Nml Nml Nml 0 Nml Nml     Nerve Conduction Studies Anti Sensory Left/Right Comparison   Stim Site L Lat (ms) R Lat (ms) L-R Lat (ms) L Amp (V) R Amp (V) L-R Amp (%) Site1 Site2 L Vel (m/s) R Vel (m/s) L-R Vel (m/s)  Median Acr Palm Anti Sensory (2nd Digit)  32.4C  Wrist *4.8 *6.2 1.4 10.7 *8.9 16.8 Wrist Palm     Palm *2.8 *5.9 3.1 1.2 4.7 74.5       Radial Anti Sensory (Base 1st Digit)   31C  Wrist  2.4   8.8  Wrist Base 1st Digit     Ulnar Anti Sensory (5th Digit)  31.4C  Wrist  3.6   *9.3  Wrist 5th Digit  39    Motor Left/Right Comparison   Stim Site L Lat (ms) R Lat (ms) L-R Lat (ms) L Amp (mV) R Amp (mV) L-R Amp (%) Site1 Site2 L Vel (m/s) R Vel (m/s) L-R Vel (m/s)  Median Motor (Abd Poll Brev)  32.8C  Wrist *4.8 *5.7 *0.9 *4.8 *4.7 2.1 Elbow Wrist *48 *42 6  Elbow 9.7 11.4 1.7 4.6 4.3 6.5       Ulnar Motor (Abd Dig Min)  31.1C  Wrist  3.2   10.4  B Elbow Wrist  58   B Elbow  7.0   9.7  A Elbow B Elbow  59   A Elbow  8.7   9.3           Waveforms:

## 2019-04-30 NOTE — Progress Notes (Signed)
Patient is 83 year old gentleman is here to go over nerve conduction studies of his bilateral upper extremities to assess for carpal tunnel syndrome.  He has been having numbness and tingling and weakness in both hands with the right worse than left.  He is right-hand dominant.  Examination of his hand shows no muscle atrophy but a weak grip and pinch strength bilaterally with positive Tinel's and Phalen's exams.  The nerve conduction studies do show moderate to severe carpal tunnel syndrome on the right side and moderate carpal tunnel syndrome on the left side.  I went over the studies with him in detail and what this means.  We talked about the transverse carpal ligament and the anatomy of the hand and wrist.  We talked about surgery.  We talked about nonoperative and operative treatment options.  We are recommending carpal tunnel release and we would do the separately with the right side first on the left side at least 6 weeks to 3 months after.  He is having some tree work done at home and would like to think about having this done in about a month or 2.  I explained in detail the risk and benefits of surgery.  This can be done under sedation in an outpatient surgical center.  I did give him our surgery scheduler's card and let them know he can give [place] a call when he would like to have this set up.  All question concerns were answered and addressed.

## 2019-05-13 ENCOUNTER — Other Ambulatory Visit: Payer: Self-pay | Admitting: Family Medicine

## 2019-05-13 DIAGNOSIS — E118 Type 2 diabetes mellitus with unspecified complications: Secondary | ICD-10-CM

## 2019-05-21 ENCOUNTER — Other Ambulatory Visit: Payer: Self-pay | Admitting: Cardiology

## 2019-05-24 ENCOUNTER — Ambulatory Visit (INDEPENDENT_AMBULATORY_CARE_PROVIDER_SITE_OTHER): Payer: Medicare Other | Admitting: Cardiology

## 2019-05-24 ENCOUNTER — Other Ambulatory Visit: Payer: Self-pay

## 2019-05-24 ENCOUNTER — Encounter: Payer: Self-pay | Admitting: Cardiology

## 2019-05-24 VITALS — BP 136/70 | HR 68 | Temp 97.0°F | Ht 68.5 in | Wt 214.0 lb

## 2019-05-24 DIAGNOSIS — G5601 Carpal tunnel syndrome, right upper limb: Secondary | ICD-10-CM

## 2019-05-24 DIAGNOSIS — G5602 Carpal tunnel syndrome, left upper limb: Secondary | ICD-10-CM

## 2019-05-24 DIAGNOSIS — I350 Nonrheumatic aortic (valve) stenosis: Secondary | ICD-10-CM

## 2019-05-24 DIAGNOSIS — I25119 Atherosclerotic heart disease of native coronary artery with unspecified angina pectoris: Secondary | ICD-10-CM | POA: Diagnosis not present

## 2019-05-24 DIAGNOSIS — Z789 Other specified health status: Secondary | ICD-10-CM | POA: Diagnosis not present

## 2019-05-24 DIAGNOSIS — I5032 Chronic diastolic (congestive) heart failure: Secondary | ICD-10-CM

## 2019-05-24 DIAGNOSIS — E785 Hyperlipidemia, unspecified: Secondary | ICD-10-CM

## 2019-05-24 DIAGNOSIS — Z955 Presence of coronary angioplasty implant and graft: Secondary | ICD-10-CM

## 2019-05-24 DIAGNOSIS — I110 Hypertensive heart disease with heart failure: Secondary | ICD-10-CM | POA: Diagnosis not present

## 2019-05-24 DIAGNOSIS — Z01810 Encounter for preprocedural cardiovascular examination: Secondary | ICD-10-CM | POA: Diagnosis not present

## 2019-05-24 NOTE — Progress Notes (Signed)
PCP: Denita Lung, MD  Clinic Note: Chief Complaint  Patient presents with   Follow-up    Delayed 86-month, more annual for CAD.   Edema    Feet.  Left versus right   Shortness of Breath    Stable   Coronary Artery Disease    No angina    HPI: [name] is a 83 y.o. male with a complicated past medical history including CAD-CABG in 1995 with several PCI's, and AAA status post EVAR history noted below who presents today for essentially annual follow-up.  Six-month follow-up was delayed due to COVID-19 quarantine.  Long-standing history of CAD dating back to 1985:  Inferior STEMI 1985: Eventually led to two-vessel CABG (LIMA-LAD, SVG-OM) in [place] showed occlusion of the SVG-OM.  --> PCI in 2004 and 2007, 2015 and then most recently 2018.  '04: Staged Taxus DES PCI to RCA (2 Taxus 2.75 x 32 & 12) and Cx-OM (Taxus 3 x 20);;  '07 - PCI to pRCA ISR- Cypher DES  PCI August 2015 -- PCI to the RCA & OM (in-stent restenosis, and OM). Follow-up Showed patent stents and grafts.  July 18-20, 2018 non-STEMI: Cath revealed CTO occlusion of pLAD (not PCI target). Patent LIMA-LAD & RCA with patent LCx-OM stents  -- med Rx:   He was put back on Plavix. Started on Ranexa; patent LIMA-LAD and RCA as well as circumflex stents  He also has history of AAA s/p EVAR   April 2018:  Embolization of Lumbar Artery- VIR (Dr. Barbie Banner).  Jan 2018: IMA "stabilization" by Dr. Donzetta Matters.  June 2018-- f/u with Dr Donzetta Matters ([facility]) for AAA -  Noted to have a type II endoleak.    Coil embolization March 25, 2017  Describes claudication symptoms in both calves after walking to having 3 blocks  He is intolerant of statins currently on fenofibrate (having stopped Zetia).  Efrin was last seen in October 2019 --was doing well.  Stable from a cardiac standpoint.  Still has 1-2+ bilateral edema but no help with diuretic.  Stable exertional dyspnea. - Echo ordered for evaluation of aortic  valve disease.  Recent Hospitalizations: .none  Studies Personally Reviewed - (if available, images/films reviewed: From Epic Chart or Care Everywhere)  Echo 07/2018: Normal LV size.  EF 50 to 55% with mild HK of inferolateral wall.  GRII DD.  Mild AS with mean gradient 15 mm or greater.  Mild ascending aortic dilation.  Mildly increased PA pressures of 41 mmHg  Interval History: [name] presents pretty much doing okay.  No major cardiac complaints.  He still has lateral limitations because of hip pain.  As keeping him from doing a lot of walking, but he is try to stay active.  He may very well need to have hip or some other surgery in the near future.  He has stable lower extremity edema.  Notes that really did not help using diuretic.  Worse on the left than the right.  Usually goes to at night and with elevation.  No chest pain or pressure with rest or exertion.  With the level of activity does, denies any exertional dyspnea unless he overdoes it.  No rapid irregular heartbeats/palpitations.  No syncope/near syncope or TIA/amaurosis fugax.  Since I last saw him, he has had issues with PCSK9 inhibitor, and refused to take the other one.  He has not been on anything since.   ROS: A comprehensive was performed. Review of Systems  Constitutional:  Negative for malaise/fatigue and weight loss.  HENT: Negative for congestion and nosebleeds.   Respiratory: Positive for shortness of breath (Stable exertional dyspnea). Negative for cough.   Cardiovascular: Negative for claudication (Hard to tell if this is claudication, but notes leg fatigue after couple blocks.-More related to hip pain.).       Per history of present illness  Gastrointestinal: Negative for abdominal pain, blood in stool, heartburn and melena.  Genitourinary: Positive for frequency (Baseline nocturia). Negative for hematuria.  Musculoskeletal: Positive for joint pain (Normal for his pains).       Bilateral carpal tunnel pain.   Is hoping to have carpal tunnel surgery.  Neurological: Negative for dizziness.  Psychiatric/Behavioral: Negative for depression. The patient is not nervous/anxious and does not have insomnia.   All other systems reviewed and are negative.  The patient does not have symptoms concerning for COVID-19 infection (fever, chills, cough, or new shortness of breath).  The patient is practicing social distancing & masking protocol.   COVID-19 Education: The signs and symptoms of COVID-19 were discussed with the patient and how to seek care for testing (follow up with PCP or arrange E-visit).   The importance of social distancing was discussed today.   I have reviewed and (if needed) personally updated the patient's problem list, medications, allergies, past medical and surgical history, social and family history.   Past Medical History:  Diagnosis Date   Adenomatous colon polyp    Ankle edema    Chronic   Asthma    CAD S/P percutaneous coronary angioplasty 06/2003, 12/2005, 05/2014   a) '04: Staged Taxus DES PCI to RCA (2 Taxus 2.75 x 32 & 12) and Cx-OM (Taxus 3 x 20);;'07 - PCI to pRCA ISR- Cypher DES; c) 05/2014: pPCI pRCA stentISR (Promus DES 3 x 8) OM1 distal to stent (Promus DES 2.75 x 12 - 3.1)   CAD, multiple vessel 1985   Most recent July 2018: Admitted for non-STEMI. Occluded LAD with patent LIMA (SP1 now occluded).  Patent stents in proximal and mid RCA as well as circumflex-OM 3. Known occlusion of SVG-OM. EF 45-50%   Cholelithiasis - with cholangitis & choledocholithiasis    status post ERCP with removal of calculi and biliary stent placement.   Chronic anemia    On iron supplement; history of positive guaiac - negative colonoscopy in 1996.; Thought to be related to hemorrhoids; status post hemorrhoidectomy   Chronic back pain     multiple surgeries; C-spine and lumbar   Diabetes mellitus    on oral medication   Diverticulitis of colon 1996   Diverticulosis     Dyslipidemia, goal LDL below 70    Erectile dysfunction    Exertional dyspnea    Chronic baseline SOB with ambulation   GERD (gastroesophageal reflux disease)    H/O: pneumonia February 14   Hemorrhoids    Hiatal hernia    History of: ST elevation myocardial infarction (STEMI) involving left circumflex coronary artery with complication [id number]   PTCA-circumflex; PCI in 1991   Hypertension    Moderate aortic stenosis by prior echocardiogram 07/2015   Normal LV function - EF 55-60%.. Abnormal relaxation. Mild-moderate aortic stenosis (peak/mean gradient 18/10 mmH)   Osteoarthritis of both knees    And back; multiple back surgeries, right knee arthroplasty and left knee arthroscopic surgery x2   Pneumonia 2016   S/P AAA (abdominal aortic aneurysm) repair 08/30/2012   s/p EVAR   S/P CABG x 2 1997   LIMA-LAD, SVG-OM  Sleep apnea    pt. states he was told to return for f/u, to be fitted for Cpap, but pt. reports that he didn't follow up-no cpap used    Past Surgical History:  Procedure Laterality Date   Abdominal and Lower Extremity Arterial Ultrasound  08/23/2012; 10/10/2013   Normal ABIs. Nonocclusive lower extremity disease. 4.2 cm x 4.3 cm infrarenal AAA;; 4.4 cm x 4.3 cm (essentially stable)    ABDOMINAL AORTIC ENDOVASCULAR STENT GRAFT N/A 08/13/2015   Procedure: ABDOMINAL AORTIC ENDOVASCULAR STENT GRAFT;  Surgeon: Mal Misty, MD;  Location: [facility] OR;  Service: Vascular;  Laterality: N/A;   Anterior cervical plating  04/23/10   At C4-5 and a C6-7 utilizing two separate Biomet MaxAn plates.   ANTERIOR LAT LUMBAR FUSION Left 11/22/2012   Procedure: ANTERIOR LATERAL LUMBAR FUSION 1 LEVEL;  Surgeon: Eustace Moore, MD;  Location: [place] [facility];  Service: Neurosurgery;  Laterality: Left;  Anterior Lateral Lumbar Fusion Lumbar Three-Four   BACK SURGERY  1979 & x 10   pt. remarks, "I have had about 10 back surgeries"   BILIARY STENT PLACEMENT N/A 07/03/2014   Procedure:  BILIARY STENT PLACEMENT;  Surgeon: Inda Castle, MD;  Location: [place];  Service: Endoscopy;  Laterality: N/A;   CARDIAC CATHETERIZATION  September 2004   None Occluded vein graft to OM; diffuse RCA disease in the mid vessel, 80% circumflex-OM stenosis; follow on AV groove circumflex with sequential 90% stenoses and intervening saccular dilation    CARDIAC CATHETERIZATION  March 2010   4 abnormal Myoview showing apical thinning (possibly due to apical LAD 95%) : 100% Occluded LAD after SV1, distal LAD grafted via LIMA -apical 95% . Cx -OM1 w/patent stent extending into OM 1 . Follow on Cx - 70-80% - non-amenable PCI. RCA widely patent 3 overlapping stents in mRCA w/less than 40% stenosisin RPL; SVG-OM known occluded    CATARACT EXTRACTION     Cervical arthrodesis  04/23/10   Anterior cervical arthrodesis, C4-5, C6-7 utilizing 7-mm PEEK interbody cage packed with local autograft & Antifuse putty at C4-5 & an 8-mm cage at C6-7.   CERVICAL DISCECTOMY  04/23/10   Decompressive anterior carvical diskectomy. C4-5, C6-7   CHOLECYSTECTOMY N/A 05/23/2015   Procedure: LAPAROSCOPIC CHOLECYSTECTOMY WITH INTRAOPERATIVE CHOLANGIOGRAM;  Surgeon: Alphonsa Overall, MD;  Location: [facility];  Service: General;  Laterality: N/A;   [place]  1985,1991,15   1985 lateral STEMI Circumflex PTCA;    CORONARY ARTERY BYPASS GRAFT  1997   LIMA-LAD, SVG-OM (SVG known to be occluded prior to 2004)   ERCP N/A 07/03/2014   Procedure: ENDOSCOPIC RETROGRADE CHOLANGIOPANCREATOGRAPHY (ERCP);  Surgeon: Inda Castle, MD;  Location: [place];  Service: Endoscopy;  Laterality: N/A;   ERCP N/A 07/05/2014   Procedure: ENDOSCOPIC RETROGRADE CHOLANGIOPANCREATOGRAPHY (ERCP);  Surgeon: Inda Castle, MD;  Location: [place];  Service: Endoscopy;  Laterality: N/A;   ERCP N/A 06/30/2015   Procedure: ENDOSCOPIC RETROGRADE CHOLANGIOPANCREATOGRAPHY (ERCP);  Surgeon: Ladene Artist, MD;   Location: [facility];  Service: Endoscopy;  Laterality: N/A;   EYE SURGERY     IR ANGIOGRAM EXTREMITY LEFT  01/18/2017   IR ANGIOGRAM PELVIS SELECTIVE OR SUPRASELECTIVE  01/18/2017   IR ANGIOGRAM SELECTIVE EACH ADDITIONAL VESSEL  01/18/2017   IR AORTAGRAM ABDOMINAL SERIALOGRAM  01/18/2017   IR EMBO ARTERIAL NOT HEMORR HEMANG INC GUIDE ROADMAPPING  01/18/2017   IR RADIOLOGIST EVAL & MGMT  01/04/2017   IR RADIOLOGIST EVAL &  MGMT  10/26/2017   IR RADIOLOGIST EVAL & MGMT  05/24/2018   IR US GUIDE VASC ACCESS RIGHT  01/18/2017   KNEE ARTHROSCOPY Left    x 2   LEFT HEART CATH AND CORS/GRAFTS ANGIOGRAPHY N/A 04/20/2017   Procedure: Left Heart Cath and Cors/Grafts Angiography;  Surgeon: Lorretta Harp, MD;  Location: [place] [facility];  LAD now occluded prior to SP1.LIMA-LAD. Patent RCA and circumflex stents. EF 45-50%. Relatively stable.    LEFT HEART CATHETERIZATION WITH CORONARY ANGIOGRAM N/A 05/15/2014   Procedure: LEFT HEART CATHETERIZATION WITH CORONARY ANGIOGRAM;  Surgeon: Sinclair Grooms, MD;  Location: [facility] CATH LAB;  Service: Cardiovascular;  Laterality: N/A;   LEFT HEART CATHETERIZATION WITH CORONARY/GRAFT ANGIOGRAM N/A 06/28/2014   Procedure: LEFT HEART CATHETERIZATION WITH Beatrix Fetters;  Surgeon: Troy Sine, MD;  Location: [facility] CATH LAB;  Service: Cardiovascular;  Laterality: N/A;   LUMBAR PERCUTANEOUS PEDICLE SCREW 1 LEVEL N/A 11/22/2012   Procedure: LUMBAR PERCUTANEOUS PEDICLE SCREW 1 LEVEL;  Surgeon: Eustace Moore, MD;  Location: [place] [facility];  Service: Neurosurgery;  Laterality: N/A;  Lumbar Three-Four Percutaneous Pedicle Screw, Lateral approach   MINOR HEMORRHOIDECTOMY     NM MYOVIEW LTD  December 2013   LOW RISK. Mmoderate region of mid to basal inferolateral scar without ischemia. Mild apical hypokinesis with an EF of 47%.   PERCUTANEOUS CORONARY STENT INTERVENTION (PCI-S)  September 2004   PCI - RCA 2 overlapping Taxus DES 2.75 mm x 32 mm and 2.75 mm x  12 mm (3.0 mm); PCI-Cx-OM1 - Taxus DES 3.0 mm x 20 mm (3.1 mm);    PERCUTANEOUS CORONARY STENT INTERVENTION (PCI-S)  12/2005   80% ISR in proximal Taxus stent in RCA -- covered proximally with Cypher DES 3.0 mm x 12 mm   PERCUTANEOUS CORONARY STENT INTERVENTION (PCI-S) N/A 05/17/2014   Procedure: PERCUTANEOUS CORONARY STENT INTERVENTION (PCI-S);  Surgeon: Sinclair Grooms, MD;  Location: [facility] CATH LAB: PCI pRCA stent ISR - Promus DES 3.0 x 8 (3.25), OM distal stent edge - Promus DES 2.75 x 12 (3.1)   PERIPHERAL VASCULAR CATHETERIZATION  11/03/2016   Procedure: Embolization;  Surgeon: Waynetta Sandy, MD;  Location: [place] [facility] LAB;  Service: Cardiovascular;;   POSTERIOR CERVICAL FUSION/FORAMINOTOMY N/A 05/02/2013   Procedure: POSTERIOR CERVICAL FUSION/FORAMINOTOMY CERVICAL SEVEN THORACIC-ONE;  Surgeon: Eustace Moore, MD;  Location: [place] [facility];  Service: Neurosurgery;  Laterality: N/A;  POSTERIOR CERVICAL FUSION/FORAMINOTOMY CERVICAL SEVEN THORACIC-ONE   SPHINCTEROTOMY N/A 06/30/2015   Procedure: SPHINCTEROTOMY;  Surgeon: Ladene Artist, MD;  Location: [facility];  Service: Endoscopy;  Laterality: N/A;   TOTAL KNEE ARTHROPLASTY Right    TOTAL KNEE ARTHROPLASTY Left 12/20/2016   Procedure: LEFT TOTAL KNEE ARTHROPLASTY;  Surgeon: Vickey Huger, MD;  Location: [place];  Service: Orthopedics;  Laterality: Left;   TRANSTHORACIC ECHOCARDIOGRAM  07/2016   EF 45-50%. Inferolateral hypokinesis (correlates with scar noted on Myoview). GR 1 DD. Mild aortic stenosis. Mod LAE.   TRANSTHORACIC ECHOCARDIOGRAM  07/2018   Normal LV size.  EF 50 to 55% with mild HK of inferolateral wall.  GRII DD.  Mild AS with mean gradient 15 mm or greater.  Mild ascending aortic dilation.  Mildly increased PA pressures of 41 mmHg   VISCERAL ANGIOGRAM  11/03/2016   Procedure: Visceral Angiogram;  Surgeon: Waynetta Sandy, MD;  Location: [place] [facility];  Service: Cardiovascular;;    CARDIAC CATH  04/20/2017: New - LAD now 100% prior to  SP1/Diag1.  Not PCI option.  Prox RCA to Mid RCA stents & Ost 3rd Mrg to 3rd Mrg stents, 0 %stenosed.  Ost LAD to Prox LAD lesion, 100 %stenosed - prior to SP1/Diag1 (~ new)   LIMA-LAD and is normal in caliber and anatomically normal.  EF is 45-50% by visual estimate. Normal LVEDP   Current Meds  Medication Sig   acetaminophen (TYLENOL) 500 MG tablet Take 1,000 mg by mouth every 6 (six) hours as needed for mild pain, moderate pain or headache. Reported on 03/16/2016   albuterol (PROVENTIL HFA;VENTOLIN HFA) 108 (90 Base) MCG/ACT inhaler Inhale 2 puffs into the lungs every 6 (six) hours as needed for wheezing or shortness of breath.   Alirocumab (PRALUENT [place]) Inject into the skin every 14 (fourteen) days.   amLODipine (NORVASC) 5 MG tablet Take 1 tablet by mouth once daily   aspirin EC 81 MG EC tablet Take 1 tablet (81 mg total) by mouth daily.   Choline Fenofibrate (FENOFIBRIC ACID) 135 MG CPDR Take 1 capsule by mouth daily.   clopidogrel (PLAVIX) 75 MG tablet TAKE 1 TABLET BY MOUTH ONCE DAILY   ezetimibe (ZETIA) 10 MG tablet Take 1 tablet by mouth once daily   glucose blood (ONETOUCH VERIO) test strip USE ONE STRIP TO CHECK GLUCOSE TWICE DAILY [id number]   Iron-Folic Acid-Vit 123456 (IRON FORMULA PO) Take 65 mg by mouth 1 day or 1 dose.   losartan (COZAAR) 100 MG tablet Take 1 tablet by mouth once daily   metFORMIN (GLUCOPHAGE) 500 MG tablet Take 1 tablet by mouth once daily with breakfast   Multiple Vitamins-Minerals (MULTIVITAMIN WITH MINERALS) tablet Take 1 tablet by mouth daily.    pantoprazole (PROTONIX) 40 MG tablet Take 1 tablet by mouth once daily   ranolazine (RANEXA) 500 MG 12 hr tablet Take 1 tablet (500 mg total) by mouth 2 (two) times daily.   sildenafil (REVATIO) 20 MG tablet TAKE 2-5 TABLETS BY MOUTH AS NEEDED FOR SEXUAL ACTIVITY   [DISCONTINUED] azithromycin (ZITHROMAX) 250 MG tablet 2 tablets day 1, then 1 tablet days 2-4     [DISCONTINUED] fluticasone furoate-vilanterol (BREO ELLIPTA) 100-25 MCG/INH AEPB Inhale 1 puff into the lungs daily.   [DISCONTINUED] furosemide (LASIX) 20 MG tablet Take 20 mg ( 1 tablet) by mouth every Monday and Thursday, may take an additional dose daily if needed.   [DISCONTINUED] neomycin-polymyxin-hydrocortisone (CORTISPORIN) OTIC solution Apply 1-2 drops to toe BID after soaking    Allergies  Allergen Reactions   Oxycodone Shortness Of Breath and Cough   Lisinopril Cough   Statins Other (See Comments)    MYALGIAS    Welchol [Colesevelam Hcl] Itching   Social History   Tobacco Use   Smoking status: Former Smoker    Packs/day: 3.00    Years: 45.00    Pack years: 135.00    Types: Cigarettes    Quit date: 10/05/1983    Years since quitting: 35.6   Smokeless tobacco: Never Used  Substance Use Topics   Alcohol use: Yes    Alcohol/week: 4.0 standard drinks    Types: 4 Glasses of wine per week    Comment:  4 oz. wine daily   Drug use: No   Social History   Social History Narrative   He is married, father of two, grandfather to 73, great grandfather to two.    Not really getting much exercise now, do to his significant back and hip pain.    He does not smoke and only has  an alcoholic beverage.     Family History family history includes Arthritis in his mother; Breast cancer in his sister; Diabetes in his father and sister; Heart disease in his brother, father, and sister; Hypertension in his sister; Stroke in his sister; Ulcers in his brother.  Wt Readings from Last 3 Encounters:  05/24/19 214 lb (97.1 kg)  09/20/18 215 lb (97.5 kg)  09/05/18 220 lb (99.8 kg)    PHYSICAL EXAM BP 136/70 (BP Location: Left Arm, Patient Position: Sitting, Cuff Size: Normal)    Pulse 68    Temp (!) 97 F (36.1 C)    Ht 5' 8.5" (1.74 m)    Wt 214 lb (97.1 kg)    BMI 32.07 kg/m  Physical Exam  Constitutional: He is oriented to person, place, and time. He appears  well-developed and well-nourished. No distress.  Mildly obese, but otherwise healthy-appearing.  Well-groomed  HENT:  Head: Normocephalic and atraumatic.  Neck: Neck supple. No hepatojugular reflux and no JVD present. Carotid bruit is not present. No thyromegaly present.  Cardiovascular: Normal rate, regular rhythm and intact distal pulses.  No extrasystoles are present. PMI is not displaced. Exam reveals decreased pulses (Mildly decreased pedal pulses). Exam reveals no gallop and no friction rub.  Murmur (2/6 SEM at RUSB radiates to carotids) heard. No carotid bruit or JVD  Pulmonary/Chest: Effort normal and breath sounds normal. No respiratory distress. He has no wheezes. He has no rales.  Distant breath sounds  Abdominal: Soft. Bowel sounds are normal. He exhibits no distension. There is no abdominal tenderness. There is no rebound.  Musculoskeletal: Normal range of motion.        General: Edema (1+ left lower extremity, trace right) present.  Neurological: He is alert and oriented to person, place, and time.  Skin: Skin is warm and dry.  No evidence of venous stasis changes  Psychiatric: He has a normal mood and affect. His behavior is normal. Judgment and thought content normal.  Nursing note and vitals reviewed.   Adult ECG Report  Rate: 68;  Rhythm: normal sinus rhythm and 1 degree AVB.  Nonspecific ST and T wave changes.  Otherwise normal axis, intervals and durations.;  Cannot exclude inferior MI, age undetermined.  Narrative Interpretation: Stable EKG   Other studies Reviewed: Additional studies/ records that were reviewed today include:  Recent Labs: Based on most recent chemistry panel showing elevated creatinine, a BMP was ordered for today.   Lab Results  Component Value Date   CHOL 185 07/04/2018   HDL 47 07/04/2018   LDLCALC 115 (H) 07/04/2018   TRIG 115 07/04/2018   CHOLHDL 3.9 07/04/2018   Lab Results  Component Value Date   CREATININE 1.52 (H) 07/17/2018   BUN  19 07/17/2018   NA 142 07/17/2018   K 5.4 (H) 07/17/2018   CL 107 (H) 07/17/2018   CO2 21 07/17/2018   --Recommendations were increase hydration.  (Drinking less beer)  ASSESSMENT / PLAN:  Problem List Items Addressed This Visit    Statin intolerance   Presence of drug coated stent in right coronary artery and circumflex coronary artery (Chronic)    Remains on Plavix for maintenance.  Okay to hold for procedures.  Would be fine to hold 5-7 days preop and restart 1 day postop      Relevant Orders   EKG 12-Lead (Completed)   Preop cardiovascular exam     PREOPERATIVE CARDIAC RISK ASSESSMENT   Revised Cardiac Risk Index:  High Risk Surgery:  no;   Defined as Intraperitoneal, intrathoracic or suprainguinal vascular  Active CAD: no; no active anginal symptoms after revascularization.  No obvious revascularization options from last cath. ->  Is on stable management.  CHF: yes; stable NYHA class I-II  Cerebrovascular Disease: no;   Diabetes: no; On Insulin: no  CKD (Cr >~ 2): no; last check was 1.55 creatinine  Total: 1 Estimated Risk of Adverse Outcome: Class II-III Risk (taking into consideration history of CAD although it is stable) --> intermediate risk patient for low risk surgery. Estimated Risk of MI, PE, VF/VT (Cardiac Arrest), Complete Heart Block: 6-10 %   ACC/AHA Guidelines for "Clearance":  Step 1 - Need for Emergency Surgery: No: elective - low risk Sgx  If Yes - go straight to OR with perioperative surveillance  Step 2 - Active Cardiac Conditions (Unstable Angina, Decompensated HF, Significant  Arrhytmias - Complete HB, Mobitz II, Symptomatic VT or SVT, Severe Aortic Stenosis - mean gradient > 40 mmHg, Valve area < 1.0 cm2):  -  No: -Compensated NYHA-I-II HFpEF symptoms  If Yes - Evaluate & Treat per ACC/AHA Guidelines  Step 3 -  Low Risk Surgery: Yes  If Yes --> proceed to OR  If No --> Step 4  Step 4 - Functional Capacity >= 4 METS without  symptoms: Yes; roughly 6-8  If Yes --> proceed to OR  If No --> Step 5  Step 5 --  Clinical Risk Factors (CRF)   3 or more: No: Stable, revascularized CAD and stable HFpEF.  If Yes -- assess Surgical Risk, --   (High Risk Non-cardiac), Intraabdominal or thoracic vascular surgery consider testing if it will change management.  Intermediate Risk: Proceed to OR with HR control, or consider testing if it will change management  1-2 or more CRFs: If considering his existing CAD and HFpEF -> recently evaluated with echocardiogram and stable symptoms.  If Yes -- assess Surgical Risk, --surgery is low risk.  Further evaluation would not change recommendations Final Recommendations  Recommend proceeding to the OR without any further evaluation.  Not using beta-blocker because of issue with bradycardia.  Okay to hold Plavix 5 to 7 days preop        Relevant Orders   EKG 12-Lead (Completed)   Mild aortic stenosis by prior echocardiogram (Chronic)    Most recent echo showed stable aortic valve disease.  Stable aortic stenosis.  I think we can probably wait at least till 2021 to reassess.  If stable at that time, we probably do not need to be following as closely..      Hypertensive heart disease with chronic diastolic congestive heart failure (HCC) (Chronic)    Relatively stable class I-II NYHA symptoms.  On combination of ARB and amlodipine for pressure/afterload reduction.  Pressures look pretty good now.  But if pressures increase with increased afterload reduction with hydralazine.  Really does not have any PND orthopnea, and edema has not really been very well controlled with diuretic.  Is only taking it intermittently. Recommend support stockings.  I think he probably do better with the zipper type since he has difficulty bending over to put the socks on.      Relevant Orders   EKG 12-Lead (Completed)   Hyperlipidemia LDL goal <70; statin intolerant (Chronic)    He is due to  have lipids checked by PCP.  No longer an option to have PCSK9 inhibitor.  Is statin intolerant.  I think we may need to consider the last option  of Bempedoic Acid.  Will as our [facility] pharmacists to contact him about cost issues etc & Rx if approved.      Chronic diastolic CHF (congestive heart failure) (HCC) (Chronic)    Stable NYHA class II symptoms with mild edema and mild exertional dyspnea.  This is stable overall.  EF somewhat improved based on last echo.  Grade 2 diastolic dysfunction noted.  He is on standing dose of diuretic not requiring any additional dosing.  He is on ARB and amlodipine with adequate blood pressure blood pressure control/afterload reduction.  He is not on a beta-blocker because of bradycardia and fatigue issues.      Carpal tunnel syndrome, right upper limb - Primary   Carpal tunnel syndrome, left upper limb   CAD S/P CABG '95- several PCis since, last 05/17/14 (Chronic)    Now almost 2 years out from last non-STEMI.  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  No PCI options.  No further angina now on current dose of Ranexa and amlodipine.  He is not on beta-blocker because of fatigue and bradycardia issues.  Remains on maintenance dose clopidogrel which can be held for procedures/surgeries.  Would hold 5-7 days preop and restart 1 day postop.        Again overall doing fairly well from a cardiac standpoint.  No angina symptoms.  Edema seems to be stable.  Did not get much benefit from using Lasix. Aortic valve stenosis appears to remain mild.  Probably can wait another 2 years to evaluate (would be fall 2021)  -Recommend using support stockings, consider using the zipper type of stockings since is difficult for him to bend over to put on the standard stockings.  He is pending bilateral carpal tunnel --> as long as this within 6 months of this visit.  Would recommend proceeding with surgery without any further cardiac evaluation.   Current medicines are  reviewed at length with the patient today. (+/- concerns) None The following changes have been made: See below  Patient Instructions  Medication Instructions:  NO CHNAGES If you need a refill on your cardiac medications before your next appointment, please call your pharmacy.   Lab work: NOT NEEDED If you have labs (blood work) drawn today and your tests are completely normal, you will receive your results only by:  MyChart Message (if you have MyChart) OR  A paper copy in the mail If you have any lab test that is abnormal or we need to change your treatment, we will call you to review the results.  Testing/Procedures:  NOT  NEEDED Follow-Up: At [facility], you and your health needs are our priority.  As part of our continuing mission to provide you with exceptional heart care, we have created designated Provider Care Teams.  These Care Teams include your primary Cardiologist (physician) and Advanced Practice Providers (APPs -  Physician Assistants and Nurse Practitioners) who all work together to provide you with the care you need, when you need it.  You will need a follow up appointment in  12   Months aug 2021.  Please call our office 2 months in advance to schedule this appointment.  You may see Glenetta Hew, MD or one of the following Advanced Practice Providers on your designated Care Team:    Rosaria Ferries, PA-C  Jory Sims, DNP, ANP  Any Other Special Instructions Will Be Listed Below .  [facility] will contact you about cholesterol medications   CONTACT OFFICE WHEN   SURGERY HAS  BEEN SCHEDULE   Studies Ordered:   Orders Placed This Encounter  Procedures   EKG 12-Lead      Glenetta Hew, M.D., M.S. Interventional Cardiologist   Pager # [phone] Phone # [phone] [address] [place], [place] [address]

## 2019-05-24 NOTE — Patient Instructions (Addendum)
Medication Instructions:  NO CHNAGES If you need a refill on your cardiac medications before your next appointment, please call your pharmacy.   Lab work: NOT NEEDED If you have labs (blood work) drawn today and your tests are completely normal, you will receive your results only by: Marland Kitchen MyChart Message (if you have MyChart) OR . A paper copy in the mail If you have any lab test that is abnormal or we need to change your treatment, we will call you to review the results.  Testing/Procedures:  NOT  NEEDED Follow-Up: At [facility], you and your health needs are our priority.  As part of our continuing mission to provide you with exceptional heart care, we have created designated Provider Care Teams.  These Care Teams include your primary Cardiologist (physician) and Advanced Practice Providers (APPs -  Physician Assistants and Nurse Practitioners) who all work together to provide you with the care you need, when you need it. . You will need a follow up appointment in  12   Months aug 2021.  Please call our office 2 months in advance to schedule this appointment.  You may see Glenetta Hew, MD or one of the following Advanced Practice Providers on your designated Care Team:   . Rosaria Ferries, PA-C . Jory Sims, DNP, ANP  Any Other Special Instructions Will Be Listed Below .  [facility] will contact you about cholesterol medications   CONTACT OFFICE WHEN   SURGERY HAS BEEN SCHEDULE

## 2019-05-26 ENCOUNTER — Encounter: Payer: Self-pay | Admitting: Cardiology

## 2019-05-26 NOTE — Assessment & Plan Note (Signed)
Remains on Plavix for maintenance.  Okay to hold for procedures.  Would be fine to hold 5-7 days preop and restart 1 day postop

## 2019-05-26 NOTE — Assessment & Plan Note (Signed)
  PREOPERATIVE CARDIAC RISK ASSESSMENT   Revised Cardiac Risk Index:  High Risk Surgery: no;   Defined as Intraperitoneal, intrathoracic or suprainguinal vascular  Active CAD: no; no active anginal symptoms after revascularization.  No obvious revascularization options from last cath. ->  Is on stable management.  CHF: yes; stable NYHA class I-II  Cerebrovascular Disease: no;   Diabetes: no; On Insulin: no  CKD (Cr >~ 2): no; last check was 1.55 creatinine  Total: 1 Estimated Risk of Adverse Outcome: Class II-III Risk (taking into consideration history of CAD although it is stable) --> intermediate risk patient for low risk surgery. Estimated Risk of MI, PE, VF/VT (Cardiac Arrest), Complete Heart Block: 6-10 %   ACC/AHA Guidelines for "Clearance":  Step 1 - Need for Emergency Surgery: No: elective - low risk Sgx  If Yes - go straight to OR with perioperative surveillance  Step 2 - Active Cardiac Conditions (Unstable Angina, Decompensated HF, Significant  Arrhytmias - Complete HB, Mobitz II, Symptomatic VT or SVT, Severe Aortic Stenosis - mean gradient > 40 mmHg, Valve area < 1.0 cm2):  -  No: -Compensated NYHA-I-II HFpEF symptoms  If Yes - Evaluate & Treat per ACC/AHA Guidelines  Step 3 -  Low Risk Surgery: Yes  If Yes --> proceed to OR  If No --> Step 4  Step 4 - Functional Capacity >= 4 METS without symptoms: Yes; roughly 6-8  If Yes --> proceed to OR  If No --> Step 5  Step 5 --  Clinical Risk Factors (CRF)   3 or more: No: Stable, revascularized CAD and stable HFpEF.  If Yes -- assess Surgical Risk, --   (High Risk Non-cardiac), Intraabdominal or thoracic vascular surgery consider testing if it will change management.  Intermediate Risk: Proceed to OR with HR control, or consider testing if it will change management  1-2 or more CRFs: If considering his existing CAD and HFpEF -> recently evaluated with echocardiogram and stable symptoms.  If Yes --  assess Surgical Risk, --surgery is low risk.  Further evaluation would not change recommendations Final Recommendations  Recommend proceeding to the OR without any further evaluation.  Not using beta-blocker because of issue with bradycardia.  Okay to hold Plavix 5 to 7 days preop

## 2019-05-26 NOTE — Assessment & Plan Note (Signed)
He is due to have lipids checked by PCP.  No longer an option to have PCSK9 inhibitor.  Is statin intolerant.  I think we may need to consider the last option of Bempedoic Acid.  Will as our [facility] pharmacists to contact him about cost issues etc & Rx if approved.

## 2019-05-26 NOTE — Assessment & Plan Note (Signed)
Stable NYHA class II symptoms with mild edema and mild exertional dyspnea.  This is stable overall.  EF somewhat improved based on last echo.  Grade 2 diastolic dysfunction noted.  He is on standing dose of diuretic not requiring any additional dosing.  He is on ARB and amlodipine with adequate blood pressure blood pressure control/afterload reduction.  He is not on a beta-blocker because of bradycardia and fatigue issues.

## 2019-05-26 NOTE — Assessment & Plan Note (Signed)
Relatively stable class I-II NYHA symptoms.  On combination of ARB and amlodipine for pressure/afterload reduction.  Pressures look pretty good now.  But if pressures increase with increased afterload reduction with hydralazine.  Really does not have any PND orthopnea, and edema has not really been very well controlled with diuretic.  Is only taking it intermittently. Recommend support stockings.  I think he probably do better with the zipper type since he has difficulty bending over to put the socks on.

## 2019-05-26 NOTE — Assessment & Plan Note (Signed)
Now almost 2 years out from last non-STEMI.  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  No PCI options.  No further angina now on current dose of Ranexa and amlodipine.  He is not on beta-blocker because of fatigue and bradycardia issues.  Remains on maintenance dose clopidogrel which can be held for procedures/surgeries.  Would hold 5-7 days preop and restart 1 day postop.

## 2019-05-26 NOTE — Assessment & Plan Note (Addendum)
Most recent echo showed stable aortic valve disease.  Stable aortic stenosis.  I think we can probably wait at least till 2021 to reassess.  If stable at that time, we probably do not need to be following as closely.Marland Kitchen

## 2019-05-31 ENCOUNTER — Other Ambulatory Visit: Payer: Self-pay | Admitting: Interventional Radiology

## 2019-05-31 ENCOUNTER — Other Ambulatory Visit: Payer: Self-pay

## 2019-05-31 DIAGNOSIS — T82330D Leakage of aortic (bifurcation) graft (replacement), subsequent encounter: Secondary | ICD-10-CM

## 2019-05-31 DIAGNOSIS — IMO0001 Reserved for inherently not codable concepts without codable children: Secondary | ICD-10-CM

## 2019-05-31 DIAGNOSIS — I714 Abdominal aortic aneurysm, without rupture, unspecified: Secondary | ICD-10-CM

## 2019-06-04 ENCOUNTER — Telehealth: Payer: Self-pay | Admitting: Pharmacist

## 2019-06-04 NOTE — Telephone Encounter (Signed)
Patient will try Nexletol 180mg  daily  Medication Samples have been provided to the patient.  Drug name: NEXLETOL      Strength: 180MG   Qty: 14 TABS LOT: [id number]   Exp.Date: MAY/2022  Michaelangelo Mittelman Rodriguez-Guzman PharmD, BCPS, [place] [address] [place] [address] 06/04/2019 2:08 PM

## 2019-06-04 NOTE — Telephone Encounter (Signed)
-----   Message from Leonie Man, MD sent at 05/26/2019  7:09 PM EDT ----- Regarding: ? do you think we can try Bempedoic Acid on this pt. [name], [name] did not do well with PCSK9 [name].  He has not tolerated statins.  We really are out of options besides considering Bempedoic Acid.   I was wondering if you could review his chart, and consider if this was a good possibility.  He is due to have labs checked in October, but I doubt they can a change from what they were last October since has not been on anything.,  I recommended that we could potentially have either you or Kristen contact him to discuss the pros and cons of this new medication and whether or not he would be covered from insurance standpoint.   If you guys what might give him a call, I would appreciate it.  Thnx  DH

## 2019-06-13 ENCOUNTER — Other Ambulatory Visit: Payer: Self-pay

## 2019-06-13 ENCOUNTER — Ambulatory Visit (HOSPITAL_COMMUNITY)
Admission: RE | Admit: 2019-06-13 | Discharge: 2019-06-13 | Disposition: A | Discharge status: Home / Self Care | Payer: Medicare Other | Source: Ambulatory Visit | Attending: Interventional Radiology | Admitting: Interventional Radiology

## 2019-06-13 ENCOUNTER — Encounter (HOSPITAL_COMMUNITY): Payer: Self-pay

## 2019-06-13 DIAGNOSIS — K5730 Diverticulosis of large intestine without perforation or abscess without bleeding: Secondary | ICD-10-CM | POA: Diagnosis not present

## 2019-06-13 DIAGNOSIS — T82330D Leakage of aortic (bifurcation) graft (replacement), subsequent encounter: Secondary | ICD-10-CM | POA: Diagnosis not present

## 2019-06-13 DIAGNOSIS — I714 Abdominal aortic aneurysm, without rupture, unspecified: Secondary | ICD-10-CM

## 2019-06-13 DIAGNOSIS — IMO0001 Reserved for inherently not codable concepts without codable children: Secondary | ICD-10-CM

## 2019-06-13 LAB — POCT I-STAT CREATININE: Creatinine, Ser: 1.6 mg/dL — ABNORMAL HIGH (ref 0.61–1.24)

## 2019-06-13 MED ORDER — SODIUM CHLORIDE (PF) 0.9 % IJ SOLN
INTRAMUSCULAR | Status: AC
  Filled 2019-06-13: qty 100

## 2019-06-13 MED ORDER — IOHEXOL 350 MG/ML SOLN
80.0000 mL | Freq: Once | INTRAVENOUS | Status: AC | PRN
  Administered 2019-06-13: 80 mL via INTRAVENOUS

## 2019-06-14 ENCOUNTER — Other Ambulatory Visit: Payer: Self-pay

## 2019-06-14 ENCOUNTER — Encounter: Payer: Self-pay | Admitting: Family Medicine

## 2019-06-14 DIAGNOSIS — K3189 Other diseases of stomach and duodenum: Secondary | ICD-10-CM

## 2019-06-14 NOTE — Progress Notes (Unsigned)
I received a call from Dr. Jacqulynn Cadet concerning recent CT scan.  The scan apparently showed a gastric mass.  I relayed this information to [name] and will refer him to GI for follow-up on this

## 2019-06-18 ENCOUNTER — Other Ambulatory Visit: Payer: Self-pay | Admitting: Cardiology

## 2019-06-19 ENCOUNTER — Other Ambulatory Visit: Payer: Self-pay

## 2019-06-19 ENCOUNTER — Ambulatory Visit
Admission: RE | Admit: 2019-06-19 | Discharge: 2019-06-19 | Disposition: A | Discharge status: Home / Self Care | Payer: Medicare Other | Source: Ambulatory Visit | Attending: Interventional Radiology | Admitting: Interventional Radiology

## 2019-06-19 ENCOUNTER — Encounter: Payer: Self-pay | Admitting: *Deleted

## 2019-06-19 DIAGNOSIS — I714 Abdominal aortic aneurysm, without rupture, unspecified: Secondary | ICD-10-CM

## 2019-06-19 DIAGNOSIS — Z23 Encounter for immunization: Secondary | ICD-10-CM | POA: Diagnosis not present

## 2019-06-19 DIAGNOSIS — IMO0001 Reserved for inherently not codable concepts without codable children: Secondary | ICD-10-CM

## 2019-06-19 DIAGNOSIS — T82330D Leakage of aortic (bifurcation) graft (replacement), subsequent encounter: Secondary | ICD-10-CM

## 2019-06-19 DIAGNOSIS — T82330A Leakage of aortic (bifurcation) graft (replacement), initial encounter: Secondary | ICD-10-CM | POA: Diagnosis not present

## 2019-06-19 HISTORY — PX: IR RADIOLOGIST EVAL & MGMT: IMG5224

## 2019-06-19 NOTE — Progress Notes (Signed)
Chief Complaint: Endoleak  Referring Physician(s): Dr. Donzetta Matters.    History of Present Illness: [name] is a 83 y.o. male presenting to [facility] today as a scheduled follow up for his type II endoleak.    He is well known to our service, with his first referral to my partner, the late Dr. Barbie Banner, 01/04/2017.    [name] has a history of AAA treated with infra-renal fixation 08/13/2015.  This was performed with a Gore Excluder, [id number] main body, via the right, and left limb of 14x10.  The pre-operative diameter was ~5cm.  He had a type II endoleak identified, with interval growth of the excluded sac.  The IMA was embolized via TA approach by Dr. Donzetta Matters 11/03/2016, and then the L4 lumbar arteries were embolized by Dr. Barbie Banner 01/04/2017 for regrowth, to maximum diameter of 5.6cm.    [name] has had his most recent exam performed 06/13/2019, with estimated maximal diameter of 5.8cm.  There are no concerning features such as inflammation, wall thickening, or fluid.  There is no migration or evidence of type I leak. There is persisting type II leak.   [name] and his wife join me on our telemedicine call today, given the COVID situation.    He is feeling very well, and denies any abd pain.  Denies any new symptoms.  Denies any blood in the stool.  He has no fevers, rigors, chills, or recent hospitalization.     Past Medical History:  Diagnosis Date   Adenomatous colon polyp    Ankle edema    Chronic   Asthma    CAD S/P percutaneous coronary angioplasty 06/2003, 12/2005, 05/2014   a) '04: Staged Taxus DES PCI to RCA (2 Taxus 2.75 x 32 & 12) and Cx-OM (Taxus 3 x 20);;'07 - PCI to pRCA ISR- Cypher DES; c) 05/2014: pPCI pRCA stentISR (Promus DES 3 x 8) OM1 distal to stent (Promus DES 2.75 x 12 - 3.1)   CAD, multiple vessel 1985   Most recent July 2018: Admitted for non-STEMI. Occluded LAD with patent LIMA (SP1 now occluded).  Patent stents in proximal and mid RCA as well as  circumflex-OM 3. Known occlusion of SVG-OM. EF 45-50%   Cholelithiasis - with cholangitis & choledocholithiasis    status post ERCP with removal of calculi and biliary stent placement.   Chronic anemia    On iron supplement; history of positive guaiac - negative colonoscopy in 1996.; Thought to be related to hemorrhoids; status post hemorrhoidectomy   Chronic back pain     multiple surgeries; C-spine and lumbar   Diabetes mellitus    on oral medication   Diverticulitis of colon 1996   Diverticulosis    Dyslipidemia, goal LDL below 70    Erectile dysfunction    Exertional dyspnea    Chronic baseline SOB with ambulation   GERD (gastroesophageal reflux disease)    H/O: pneumonia February 14   Hemorrhoids    Hiatal hernia    History of: ST elevation myocardial infarction (STEMI) involving left circumflex coronary artery with complication [id number]   PTCA-circumflex; PCI in 1991   Hypertension    Moderate aortic stenosis by prior echocardiogram 07/2015   Normal LV function - EF 55-60%.. Abnormal relaxation. Mild-moderate aortic stenosis (peak/mean gradient 18/10 mmH)   Osteoarthritis of both knees    And back; multiple back surgeries, right knee arthroplasty and left knee arthroscopic surgery x2   Pneumonia 2016   S/P AAA (abdominal aortic aneurysm)  repair 08/30/2012   s/p EVAR   S/P CABG x 2 1997   LIMA-LAD, SVG-OM   Sleep apnea    pt. states he was told to return for f/u, to be fitted for Cpap, but pt. reports that he didn't follow up-no cpap used    Past Surgical History:  Procedure Laterality Date   Abdominal and Lower Extremity Arterial Ultrasound  08/23/2012; 10/10/2013   Normal ABIs. Nonocclusive lower extremity disease. 4.2 cm x 4.3 cm infrarenal AAA;; 4.4 cm x 4.3 cm (essentially stable)    ABDOMINAL AORTIC ENDOVASCULAR STENT GRAFT N/A 08/13/2015   Procedure: ABDOMINAL AORTIC ENDOVASCULAR STENT GRAFT;  Surgeon: Mal Misty, MD;  [facility];   Service: Vascular;  Laterality: N/A;   Anterior cervical plating  04/23/10   At C4-5 and a C6-7 utilizing two separate Biomet MaxAn plates.   ANTERIOR LAT LUMBAR FUSION Left 11/22/2012   Procedure: ANTERIOR LATERAL LUMBAR FUSION 1 LEVEL;  Surgeon: Eustace Moore, MD;  Location: [place] [facility];  Service: Neurosurgery;  Laterality: Left;  Anterior Lateral Lumbar Fusion Lumbar Three-Four   BACK SURGERY  1979 & x 10   pt. remarks, "I have had about 10 back surgeries"   BILIARY STENT PLACEMENT N/A 07/03/2014   Procedure: BILIARY STENT PLACEMENT;  Surgeon: Inda Castle, MD;  Location: [place];  Service: Endoscopy;  Laterality: N/A;   CARDIAC CATHETERIZATION  September 2004   None Occluded vein graft to OM; diffuse RCA disease in the mid vessel, 80% circumflex-OM stenosis; follow on AV groove circumflex with sequential 90% stenoses and intervening saccular dilation    CARDIAC CATHETERIZATION  March 2010   4 abnormal Myoview showing apical thinning (possibly due to apical LAD 95%) : 100% Occluded LAD after SV1, distal LAD grafted via LIMA -apical 95% . Cx -OM1 w/patent stent extending into OM 1 . Follow on Cx - 70-80% - non-amenable PCI. RCA widely patent 3 overlapping stents in mRCA w/less than 40% stenosisin RPL; SVG-OM known occluded    CATARACT EXTRACTION     Cervical arthrodesis  04/23/10   Anterior cervical arthrodesis, C4-5, C6-7 utilizing 7-mm PEEK interbody cage packed with local autograft & Antifuse putty at C4-5 & an 8-mm cage at C6-7.   CERVICAL DISCECTOMY  04/23/10   Decompressive anterior carvical diskectomy. C4-5, C6-7   CHOLECYSTECTOMY N/A 05/23/2015   Procedure: LAPAROSCOPIC CHOLECYSTECTOMY WITH INTRAOPERATIVE CHOLANGIOGRAM;  Surgeon: Alphonsa Overall, MD;  Location: [facility];  Service: General;  Laterality: N/A;   [place]  1985,1991,15   1985 lateral STEMI Circumflex PTCA;    CORONARY ARTERY BYPASS GRAFT  1997   LIMA-LAD, SVG-OM (SVG known to  be occluded prior to 2004)   ERCP N/A 07/03/2014   Procedure: ENDOSCOPIC RETROGRADE CHOLANGIOPANCREATOGRAPHY (ERCP);  Surgeon: Inda Castle, MD;  Location: [place];  Service: Endoscopy;  Laterality: N/A;   ERCP N/A 07/05/2014   Procedure: ENDOSCOPIC RETROGRADE CHOLANGIOPANCREATOGRAPHY (ERCP);  Surgeon: Inda Castle, MD;  Location: [place];  Service: Endoscopy;  Laterality: N/A;   ERCP N/A 06/30/2015   Procedure: ENDOSCOPIC RETROGRADE CHOLANGIOPANCREATOGRAPHY (ERCP);  Surgeon: Ladene Artist, MD;  Location: [facility];  Service: Endoscopy;  Laterality: N/A;   EYE SURGERY     IR ANGIOGRAM EXTREMITY LEFT  01/18/2017   IR ANGIOGRAM PELVIS SELECTIVE OR SUPRASELECTIVE  01/18/2017   IR ANGIOGRAM SELECTIVE EACH ADDITIONAL VESSEL  01/18/2017   IR AORTAGRAM ABDOMINAL SERIALOGRAM  01/18/2017   IR EMBO ARTERIAL NOT HEMORR HEMANG INC  GUIDE ROADMAPPING  01/18/2017   IR RADIOLOGIST EVAL & MGMT  01/04/2017   IR RADIOLOGIST EVAL & MGMT  10/26/2017   IR RADIOLOGIST EVAL & MGMT  05/24/2018   IR RADIOLOGIST EVAL & MGMT  06/19/2019   IR US GUIDE VASC ACCESS RIGHT  01/18/2017   KNEE ARTHROSCOPY Left    x 2   LEFT HEART CATH AND CORS/GRAFTS ANGIOGRAPHY N/A 04/20/2017   Procedure: Left Heart Cath and Cors/Grafts Angiography;  Surgeon: Lorretta Harp, MD;  Location: [place] [facility];  LAD now occluded prior to SP1.LIMA-LAD. Patent RCA and circumflex stents. EF 45-50%. Relatively stable.    LEFT HEART CATHETERIZATION WITH CORONARY ANGIOGRAM N/A 05/15/2014   Procedure: LEFT HEART CATHETERIZATION WITH CORONARY ANGIOGRAM;  Surgeon: Sinclair Grooms, MD;  Location: [facility] CATH LAB;  Service: Cardiovascular;  Laterality: N/A;   LEFT HEART CATHETERIZATION WITH CORONARY/GRAFT ANGIOGRAM N/A 06/28/2014   Procedure: LEFT HEART CATHETERIZATION WITH Beatrix Fetters;  Surgeon: Troy Sine, MD;  Location: [facility] CATH LAB;  Service: Cardiovascular;  Laterality: N/A;   LUMBAR PERCUTANEOUS PEDICLE  SCREW 1 LEVEL N/A 11/22/2012   Procedure: LUMBAR PERCUTANEOUS PEDICLE SCREW 1 LEVEL;  Surgeon: Eustace Moore, MD;  Location: [place] [facility];  Service: Neurosurgery;  Laterality: N/A;  Lumbar Three-Four Percutaneous Pedicle Screw, Lateral approach   MINOR HEMORRHOIDECTOMY     NM MYOVIEW LTD  December 2013   LOW RISK. Mmoderate region of mid to basal inferolateral scar without ischemia. Mild apical hypokinesis with an EF of 47%.   PERCUTANEOUS CORONARY STENT INTERVENTION (PCI-S)  September 2004   PCI - RCA 2 overlapping Taxus DES 2.75 mm x 32 mm and 2.75 mm x 12 mm (3.0 mm); PCI-Cx-OM1 - Taxus DES 3.0 mm x 20 mm (3.1 mm);    PERCUTANEOUS CORONARY STENT INTERVENTION (PCI-S)  12/2005   80% ISR in proximal Taxus stent in RCA -- covered proximally with Cypher DES 3.0 mm x 12 mm   PERCUTANEOUS CORONARY STENT INTERVENTION (PCI-S) N/A 05/17/2014   Procedure: PERCUTANEOUS CORONARY STENT INTERVENTION (PCI-S);  Surgeon: Sinclair Grooms, MD;  Location: [facility] CATH LAB: PCI pRCA stent ISR - Promus DES 3.0 x 8 (3.25), OM distal stent edge - Promus DES 2.75 x 12 (3.1)   PERIPHERAL VASCULAR CATHETERIZATION  11/03/2016   Procedure: Embolization;  Surgeon: Waynetta Sandy, MD;  Location: [place] [facility];  Service: Cardiovascular;;   POSTERIOR CERVICAL FUSION/FORAMINOTOMY N/A 05/02/2013   Procedure: POSTERIOR CERVICAL FUSION/FORAMINOTOMY CERVICAL SEVEN THORACIC-ONE;  Surgeon: Eustace Moore, MD;  Location: [place] [facility];  Service: Neurosurgery;  Laterality: N/A;  POSTERIOR CERVICAL FUSION/FORAMINOTOMY CERVICAL SEVEN THORACIC-ONE   SPHINCTEROTOMY N/A 06/30/2015   Procedure: SPHINCTEROTOMY;  Surgeon: Ladene Artist, MD;  Location: [facility];  Service: Endoscopy;  Laterality: N/A;   TOTAL KNEE ARTHROPLASTY Right    TOTAL KNEE ARTHROPLASTY Left 12/20/2016   Procedure: LEFT TOTAL KNEE ARTHROPLASTY;  Surgeon: Vickey Huger, MD;  Location: [place];  Service: Orthopedics;  Laterality: Left;   TRANSTHORACIC  ECHOCARDIOGRAM  07/2016   EF 45-50%. Inferolateral hypokinesis (correlates with scar noted on Myoview). GR 1 DD. Mild aortic stenosis. Mod LAE.   TRANSTHORACIC ECHOCARDIOGRAM  07/2018   Normal LV size.  EF 50 to 55% with mild HK of inferolateral wall.  GRII DD.  Mild AS with mean gradient 15 mm or greater.  Mild ascending aortic dilation.  Mildly increased PA pressures of 41 mmHg   VISCERAL ANGIOGRAM  11/03/2016   Procedure: Visceral Angiogram;  Surgeon: Waynetta Sandy, MD;  Location: [place] [facility];  Service: Cardiovascular;;    Allergies: Oxycodone, Lisinopril, Statins, and Welchol [colesevelam hcl]  Medications: Prior to Admission medications   Medication Sig Start Date End Date Taking? Authorizing Provider  acetaminophen (TYLENOL) 500 MG tablet Take 1,000 mg by mouth every 6 (six) hours as needed for mild pain, moderate pain or headache. Reported on 03/16/2016    [provider]  albuterol (PROVENTIL HFA;VENTOLIN HFA) 108 (90 Base) MCG/ACT inhaler Inhale 2 puffs into the lungs every 6 (six) hours as needed for wheezing or shortness of breath. 02/13/18   Tysinger, Camelia Eng, PA-C  Alirocumab (PRALUENT ) Inject into the skin every 14 (fourteen) days.    [provider]  amLODipine (NORVASC) 5 MG tablet Take 1 tablet by mouth once daily 06/18/19   Leonie Man, MD  aspirin EC 81 MG EC tablet Take 1 tablet (81 mg total) by mouth daily. 04/23/17   Daune Perch, NP  Choline Fenofibrate (FENOFIBRIC ACID) 135 MG CPDR Take 1 capsule by mouth daily. 01/24/19   Leonie Man, MD  clopidogrel (PLAVIX) 75 MG tablet TAKE 1 TABLET BY MOUTH ONCE DAILY 08/21/18   Leonie Man, MD  ezetimibe (ZETIA) 10 MG tablet Take 1 tablet by mouth once daily 04/10/19   Leonie Man, MD  glucose blood [facility] VERIO) test strip USE ONE STRIP TO CHECK GLUCOSE TWICE DAILY E11.9 06/15/17   Denita Lung, MD  Iron-Folic Acid-Vit 123456 (IRON FORMULA PO) Take 65 mg by mouth 1 day or  1 dose.    [provider]  losartan (COZAAR) 100 MG tablet Take 1 tablet by mouth once daily 06/18/19   Leonie Man, MD  metFORMIN (GLUCOPHAGE) 500 MG tablet Take 1 tablet by mouth once daily with breakfast 05/14/19   Denita Lung, MD  Multiple Vitamins-Minerals (MULTIVITAMIN WITH MINERALS) tablet Take 1 tablet by mouth daily.     [provider]  pantoprazole (PROTONIX) 40 MG tablet Take 1 tablet by mouth once daily 06/18/19   Leonie Man, MD  ranolazine (RANEXA) 500 MG 12 hr tablet Take 1 tablet (500 mg total) by mouth 2 (two) times daily. 01/24/19   Leonie Man, MD  sildenafil (REVATIO) 20 MG tablet TAKE 2-5 TABLETS BY MOUTH AS NEEDED FOR SEXUAL ACTIVITY 11/28/17   Denita Lung, MD     Family History  Problem Relation Age of Onset   Arthritis Mother    Diabetes Father    Heart disease Father    Stroke Sister    Hypertension Sister    Heart disease Sister    Diabetes Sister    Breast cancer Sister    Heart disease Brother    Ulcers Brother    Colon cancer Neg Hx     Social History   Socioeconomic History   Marital status: Married    Spouse name: Not on file   Number of children: Not on file   Years of education: Not on file   Highest education level: Not on file  Occupational History   Not on file  Social Needs   Financial resource strain: Not on file   Food insecurity    Worry: Not on file    Inability: Not on file   Transportation needs    Medical: Not on file    Non-medical: Not on file  Tobacco Use   Smoking status: Former Smoker    Packs/day: 3.00  Years: 45.00    Pack years: 135.00    Types: Cigarettes    Quit date: 10/05/1983    Years since quitting: 35.7   Smokeless tobacco: Never Used  Substance and Sexual Activity   Alcohol use: Yes    Alcohol/week: 4.0 standard drinks    Types: 4 Glasses of wine per week    Comment:  4 oz. wine daily   Drug use: No   Sexual activity: Not Currently    Lifestyle   Physical activity    Days per week: Not on file    Minutes per session: Not on file   Stress: Not on file  Relationships   Social connections    Talks on phone: Not on file    Gets together: Not on file    Attends religious service: Not on file    Active member of club or organization: Not on file    Attends meetings of clubs or organizations: Not on file    Relationship status: Not on file  Other Topics Concern   Not on file  Social History Narrative   He is married, father of two, grandfather to 80, great grandfather to two.    Not really getting much exercise now, do to his significant back and hip pain.    He does not smoke and only has an alcoholic beverage.        Review of Systems  Review of Systems: A 12 point ROS discussed and pertinent positives are indicated in the HPI above.  All other systems are negative.  Physical Exam No direct physical exam was performed (except for noted visual exam findings with Video Visits).    Vital Signs: There were no vitals taken for this visit.  Imaging: Ir Radiologist Eval & Mgmt  Result Date: 06/19/2019 Please refer to notes tab for details about interventional procedure. (Op Note)  Ct Angio Abd/pel W/ And/or W/o  Addendum Date: 06/14/2019   ADDENDUM REPORT: 06/14/2019 15:59 ADDENDUM: These results were called by telephone at the time of interpretation on 06/14/2019 at 3:59 pm to provider Jill Alexanders, who verbally acknowledged these results. Electronically Signed   By: Jacqulynn Cadet M.D.   On: 06/14/2019 15:59   Result Date: 06/14/2019 CLINICAL DATA:  83 year old male with a history of abdominal aortic aneurysm status post endovascular repair with a type 2 endoleak status post endovascular repair 01/18/17. EXAM: CTA ABDOMEN AND PELVIS WITHOUT AND WITH CONTRAST TECHNIQUE: Multidetector CT imaging of the abdomen and pelvis was performed using the standard protocol during bolus administration of intravenous  contrast. Multiplanar reconstructed images and MIPs were obtained and reviewed to evaluate the vascular anatomy. CONTRAST:  55mL OMNIPAQUE IOHEXOL 350 MG/ML SOLN COMPARISON:  Prior CTA abdomen/pelvis 05/24/2018 FINDINGS: VASCULAR Aorta: Fusiform abdominal aortic aneurysm status post endovascular repair with coil embolization of the IMA and right L4 lumbar artery. The excluded aneurysm sac measures 5.8 x 5.0 cm compared to 5.5 x 4.8 cm on May 24, 2018. Following administration of intravenous contrast, there is no visible endoleak on the arterial phase images. However, on delayed phase imaging, there is a subtle area of amorphous enhancement anterior to the left iliac limb (image 45, series 13). Heterogeneous atherosclerotic plaque is visualized throughout the aorta. Celiac: Patent without evidence of aneurysm, dissection, vasculitis or significant stenosis. SMA: Patent without evidence of aneurysm, dissection, vasculitis or significant stenosis. Renals: 2 right-sided renal arteries and a single left-sided renal artery. Calcified atherosclerotic plaque results in moderate stenosis at the origin of the  left main renal artery. The right upper and mid pole renal artery is patent without evidence of stenosis. The smaller accessory artery to the right lower pole is occluded by the stent graft. IMA: Coil embolized at the origin. Inflow: Stent grafts extend into the bilateral common iliac arteries. The distal landing zones are well opposed. No evidence of type B endoleak. Mildly tortuous iliac arteries with scattered atherosclerotic vascular calcifications but no significant stenosis, dissection or aneurysm. Proximal Outflow: Bilateral common femoral and visualized portions of the superficial and profunda femoral arteries are patent without evidence of aneurysm, dissection, vasculitis or significant stenosis. Veins: No focal venous abnormality. Review of the MIP images confirms the above findings. NON-VASCULAR Lower chest:  Coronary artery calcifications are visible. The patient is status post median sternotomy. The heart is at the upper limit of normal for size. A moderately large hiatal hernia is again noted. However, there is apparent rounded thickening of the wall of the stomach in the hernia at the 3 o'clock position measuring approximately 4.3 x 3.8 cm. This represents an interval change compared to the prior CT scan and is concerning for a mass. Hepatobiliary: No focal liver abnormality is seen. Status post cholecystectomy. No biliary dilatation. Pancreas: Unremarkable. No pancreatic ductal dilatation or surrounding inflammatory changes. Spleen: Normal in size without focal abnormality. Adrenals/Urinary Tract: Stable 2.8 cm left adrenal mass with central fat attenuation consistent with a benign myelolipoma. The right adrenal gland remains unremarkable. The left kidney is normal in appearance. Stable atrophy of the lower pole of the right kidney secondary to occlusion of the accessory renal artery. The ureters and bladder are unremarkable. Stomach/Bowel: Colonic diverticular disease without CT evidence of active inflammation. No focal bowel wall thickening or evidence of obstruction. Lymphatic: No suspicious lymphadenopathy. Reproductive: Prostate is unremarkable. Other: No abdominal wall hernia or abnormality. No abdominopelvic ascites. Musculoskeletal: No acute fracture or aggressive appearing lytic or blastic osseous lesion. Extensive postsurgical changes in the lumbar spine with unilateral fusion on the left and evidence of prior laminectomies. Extensive bilateral facet arthropathy. IMPRESSION: VASCULAR 1. Abdominal aortic aneurysm status post endovascular aortic repair and endovascular repair of type 2 endoleak with coil embolization of the IMA and right L4 lumbar artery. There has been slight interval enlargement of the excluded aneurysm sac now measuring 5.8 x 5.0 cm compared to 5.5 x 4.8 cm in 2019. Very subtle delayed  endoleak. No evidence of arterial phase enhancement within the excluded aneurysm sac. 2. Aortic aneurysm NOS (ICD10-I71.9); Aortic Atherosclerosis (ICD10-170.0). NON-VASCULAR 1. Interval development of a region of masslike thickening along the greater curvature of the stomach within the patient's known hiatal hernia. Differential considerations include focal gastritis versus developing gastric neoplasm. Recommend further evaluation with endoscopy. 2. Colonic diverticular disease without CT evidence of active inflammation. 3. Coronary artery calcifications. 4. Stable left adrenal myelolipoma. 5. Stable atrophy to the lower pole of the right kidney. 6. Stable postoperative changes and advanced degenerative disc disease with facet arthropathy in the lower spine. Signed, Criselda Peaches, MD, RPVI [facility] Electronically Signed: By: Jacqulynn Cadet M.D. On: 06/14/2019 15:49    Labs:  CBC: No results for input(s): WBC, HGB, HCT, PLT in the last 8760 hours.  COAGS: No results for input(s): INR, APTT in the last 8760 hours.  BMP: Recent Labs    07/04/18 1125 07/17/18 1038 06/13/19 1351  NA 141 142  --   K 5.2 5.4*  --   CL 104 107*  --  CO2 21 21  --   GLUCOSE 116* 100*  --   BUN 33* 19  --   CALCIUM 9.6 9.4  --   CREATININE 1.99* 1.52* 1.60*  GFRNONAA 31* 43*  --   GFRAA 36* 49*  --     LIVER FUNCTION TESTS: Recent Labs    07/04/18 1125  BILITOT 0.7  AST 29  ALT 25  ALKPHOS 32*  PROT 6.4  ALBUMIN 4.3    TUMOR MARKERS: No results for input(s): AFPTM, CEA, CA199, CHROMGRNA in the last 8760 hours.  Assessment and Plan:  [name] is an 83 yo male with prior AAA repair with EVAR, and persisting type II endoleak, SP embolization of both the IMA and lumbar arteries.    I had a long discussion with him regarding the indications for interventions.  I reminded him that when deciding to pursue a repair of AAA with  EVAR, we accept a known risk of late reinterventions, sometimes even multiple interventions, for the decreased upfront morbidity/mortality risk.  Given that, it is not unusual to need more than one repeat intervention for a persisting type II leak.   I did emphasize that the size of the excluded sac is not very much changed from its maximum diameter, perhaps as large as 72mm change over time, which may be a real enlargement or measurement error/technique error.  He remains asymptomatic and has no concerning features on the CT otherwise.    My impression is that observing him for any interval growth is reasonable, with which he agrees.    If he has any new abdominal pain or back pain, or any other concerning symptoms, he knows that he should come to [facility] ED for care.   I also did review the finding on the CT of gastric wall thickening, and reinforced the need to follow up with GI as instructed by his doctors.  He is aware of this and agrees.   Plan: - Follow up office visit in 1 year, with repeat CT scan for surveillance. - I have advised him to observe his other follow up appointments    Electronically Signed: Corrie Mckusick 06/19/2019, 5:29 PM   I spent a total of    15 Minutes in remote  clinical consultation, greater than 50% of which was counseling/coordinating care for type II endoleak, SP EVAR, possible late reintervention.    Visit type: Audio only (telephone). Audio (no video) only due to no video. Alternative for in-person consultation at [facility], [address] [place], [place], [place]. This visit type was conducted due to national recommendations for restrictions regarding the COVID-19 Pandemic (e.g. social distancing).  This format is felt to be most appropriate for this patient at this time.  All issues noted in this document were discussed and addressed.

## 2019-06-21 ENCOUNTER — Telehealth: Payer: Self-pay

## 2019-06-21 ENCOUNTER — Ambulatory Visit (INDEPENDENT_AMBULATORY_CARE_PROVIDER_SITE_OTHER): Payer: Medicare Other | Admitting: Physician Assistant

## 2019-06-21 ENCOUNTER — Other Ambulatory Visit: Payer: Self-pay

## 2019-06-21 ENCOUNTER — Encounter: Payer: Self-pay | Admitting: Physician Assistant

## 2019-06-21 VITALS — BP 138/76 | HR 84 | Temp 98.2°F | Ht 69.0 in | Wt 216.2 lb

## 2019-06-21 DIAGNOSIS — Z7901 Long term (current) use of anticoagulants: Secondary | ICD-10-CM | POA: Diagnosis not present

## 2019-06-21 DIAGNOSIS — R933 Abnormal findings on diagnostic imaging of other parts of digestive tract: Secondary | ICD-10-CM

## 2019-06-21 DIAGNOSIS — R935 Abnormal findings on diagnostic imaging of other abdominal regions, including retroperitoneum: Secondary | ICD-10-CM

## 2019-06-21 DIAGNOSIS — I25119 Atherosclerotic heart disease of native coronary artery with unspecified angina pectoris: Secondary | ICD-10-CM

## 2019-06-21 NOTE — Patient Instructions (Addendum)
You have been scheduled for an endoscopy. Please follow written instructions given to you at your visit today. If you use inhalers (even only as needed), please bring them with you on the day of your procedure.   We will sent clearance to Dr Wilhemina Cash for you to hold your plavix x5 days prior to procedure and contact you once we have approval.    _  _   ORAL DIABETIC MEDICATION INSTRUCTIONS  The day before your procedure:  Take your diabetic pill as you do normally  The day of your procedure:  Do not take your diabetic pill   We will check your blood sugar levels during the admission process and again in Recovery before discharging you home  _____________________________________________________________________              Thank you for choosing me and [facility].  Dennison Bulla

## 2019-06-21 NOTE — Progress Notes (Signed)
Reviewed and agree with management plan.  Blannie Shedlock T. Sharif Rendell, MD FACG [facility]  

## 2019-06-21 NOTE — Telephone Encounter (Signed)
Request for surgical clearance:     Endoscopy Procedure  What type of surgery is being performed?     Endoscopy   When is this surgery scheduled?     07/03/2019   What type of clearance is required ?   Pharmacy  Are there any medications that need to be held prior to surgery and how long? Plavix x5 days   Practice name and name of physician performing surgery?      [facility]  What is your office phone and fax number?      Phone- [phone]  Fax- [phone] [name]   Anesthesia type (None, local, MAC, general) ?       MAC

## 2019-06-21 NOTE — Telephone Encounter (Signed)
Dr. Ellyn Hack, Can Plavix be held for EGD?  Please route response back to P CV DIV PREOP

## 2019-06-21 NOTE — Progress Notes (Signed)
Chief Complaint: Abnormal imaging of the stomach  HPI:    [name] is an 83 year old Caucasian male with a past medical history as listed below including CAD status post percutaneous coronary angioplasty on Plavix (07/12/2018 echo with LVEF 50-55%), aortic stenosis with only mild stenosis, aortic valve area 1.85 cm, known to Dr. Fuller Plan, who was referred to me by Julian Lung, MD for a complaint of abnormal imaging of the stomach.      06/14/2019 CT Angie of the abdomen and pelvis with and without contrast done for patient's history of abdominal aortic aneurysm status post endovascular repair with a type II endoleak status post endovascular repair 01/18/2017.  Findings of abdominal aortic aneurysm status post endovascular aortic repair and endovascular repair of type II endoleak with coil embolization of the IMA and right L4 lumbar artery.  There was slight interval enlargement of the excluded aneurysm sac now measuring 5.8 x 5 cm compared to 5.5 x 4.8 cm in 2019.  Very subtle delayed endoleak.  No evidence of arterial phase enhancement within the excluded aneurysm sac.  Interval development of region of masslike thickening along the greater curvature of the stomach within the patient's known hiatal hernia.  Differential considerations include focal gastritis versus developing gastric neoplasm.  Colonic diverticular disease without evidence of inflammation.  Others listed on imaging study.    Today, the patient presents to clinic and explains that he was told that he might have a mass in his stomach.  He denies any GI symptoms at all.    Denies fever, chills, weight loss, heartburn, reflux, nausea, vomiting, change in bowel habits or symptoms that awaken him from sleep.  Past Medical History:  Diagnosis Date   Adenomatous colon polyp    Ankle edema    Chronic   Asthma    CAD S/P percutaneous coronary angioplasty 06/2003, 12/2005, 05/2014   a) '04: Staged Taxus DES PCI to RCA (2 Taxus 2.75 x 32 &  12) and Cx-OM (Taxus 3 x 20);;'07 - PCI to pRCA ISR- Cypher DES; c) 05/2014: pPCI pRCA stentISR (Promus DES 3 x 8) OM1 distal to stent (Promus DES 2.75 x 12 - 3.1)   CAD, multiple vessel 1985   Most recent July 2018: Admitted for non-STEMI. Occluded LAD with patent LIMA (SP1 now occluded).  Patent stents in proximal and mid RCA as well as circumflex-OM 3. Known occlusion of SVG-OM. EF 45-50%   Cholelithiasis - with cholangitis & choledocholithiasis    status post ERCP with removal of calculi and biliary stent placement.   Chronic anemia    On iron supplement; history of positive guaiac - negative colonoscopy in 1996.; Thought to be related to hemorrhoids; status post hemorrhoidectomy   Chronic back pain     multiple surgeries; C-spine and lumbar   Diabetes mellitus    on oral medication   Diverticulitis of colon 1996   Diverticulosis    Dyslipidemia, goal LDL below 70    Erectile dysfunction    Exertional dyspnea    Chronic baseline SOB with ambulation   GERD (gastroesophageal reflux disease)    H/O: pneumonia February 14   Hemorrhoids    Hiatal hernia    History of: ST elevation myocardial infarction (STEMI) involving left circumflex coronary artery with complication [id number]   PTCA-circumflex; PCI in 1991   Hypertension    Moderate aortic stenosis by prior echocardiogram 07/2015   Normal LV function - EF 55-60%.. Abnormal relaxation. Mild-moderate aortic stenosis (peak/mean gradient 18/10 mmH)  Osteoarthritis of both knees    And back; multiple back surgeries, right knee arthroplasty and left knee arthroscopic surgery x2   Pneumonia 2016   S/P AAA (abdominal aortic aneurysm) repair 08/30/2012   s/p EVAR   S/P CABG x 2 1997   LIMA-LAD, SVG-OM   Sleep apnea    pt. states he was told to return for f/u, to be fitted for Cpap, but pt. reports that he didn't follow up-no cpap used    Past Surgical History:  Procedure Laterality Date   Abdominal and Lower  Extremity Arterial Ultrasound  08/23/2012; 10/10/2013   Normal ABIs. Nonocclusive lower extremity disease. 4.2 cm x 4.3 cm infrarenal AAA;; 4.4 cm x 4.3 cm (essentially stable)    ABDOMINAL AORTIC ENDOVASCULAR STENT GRAFT N/A 08/13/2015   Procedure: ABDOMINAL AORTIC ENDOVASCULAR STENT GRAFT;  Surgeon: Mal Misty, MD;  Location: [facility];  Service: Vascular;  Laterality: N/A;   Anterior cervical plating  04/23/10   At C4-5 and a C6-7 utilizing two separate Biomet MaxAn plates.   ANTERIOR LAT LUMBAR FUSION Left 11/22/2012   Procedure: ANTERIOR LATERAL LUMBAR FUSION 1 LEVEL;  Surgeon: Eustace Moore, MD;  Location: [place] [facility];  Service: Neurosurgery;  Laterality: Left;  Anterior Lateral Lumbar Fusion Lumbar Three-Four   BACK SURGERY  1979 & x 10   pt. remarks, "I have had about 10 back surgeries"   BILIARY STENT PLACEMENT N/A 07/03/2014   Procedure: BILIARY STENT PLACEMENT;  Surgeon: Inda Castle, MD;  Location: [place];  Service: Endoscopy;  Laterality: N/A;   CARDIAC CATHETERIZATION  September 2004   None Occluded vein graft to OM; diffuse RCA disease in the mid vessel, 80% circumflex-OM stenosis; follow on AV groove circumflex with sequential 90% stenoses and intervening saccular dilation    CARDIAC CATHETERIZATION  March 2010   4 abnormal Myoview showing apical thinning (possibly due to apical LAD 95%) : 100% Occluded LAD after SV1, distal LAD grafted via LIMA -apical 95% . Cx -OM1 w/patent stent extending into OM 1 . Follow on Cx - 70-80% - non-amenable PCI. RCA widely patent 3 overlapping stents in mRCA w/less than 40% stenosisin RPL; SVG-OM known occluded    CATARACT EXTRACTION     Cervical arthrodesis  04/23/10   Anterior cervical arthrodesis, C4-5, C6-7 utilizing 7-mm PEEK interbody cage packed with local autograft & Antifuse putty at C4-5 & an 8-mm cage at C6-7.   CERVICAL DISCECTOMY  04/23/10   Decompressive anterior carvical diskectomy. C4-5, C6-7   CHOLECYSTECTOMY N/A  05/23/2015   Procedure: LAPAROSCOPIC CHOLECYSTECTOMY WITH INTRAOPERATIVE CHOLANGIOGRAM;  Surgeon: Alphonsa Overall, MD;  Location: [facility];  Service: General;  Laterality: N/A;   [place]  1985,1991,15   1985 lateral STEMI Circumflex PTCA;    CORONARY ARTERY BYPASS GRAFT  1997   LIMA-LAD, SVG-OM (SVG known to be occluded prior to 2004)   ERCP N/A 07/03/2014   Procedure: ENDOSCOPIC RETROGRADE CHOLANGIOPANCREATOGRAPHY (ERCP);  Surgeon: Inda Castle, MD;  Location: [place];  Service: Endoscopy;  Laterality: N/A;   ERCP N/A 07/05/2014   Procedure: ENDOSCOPIC RETROGRADE CHOLANGIOPANCREATOGRAPHY (ERCP);  Surgeon: Inda Castle, MD;  Location: [place];  Service: Endoscopy;  Laterality: N/A;   ERCP N/A 06/30/2015   Procedure: ENDOSCOPIC RETROGRADE CHOLANGIOPANCREATOGRAPHY (ERCP);  Surgeon: Ladene Artist, MD;  Location: [facility];  Service: Endoscopy;  Laterality: N/A;   EYE SURGERY     IR ANGIOGRAM EXTREMITY LEFT  01/18/2017   IR ANGIOGRAM PELVIS  SELECTIVE OR SUPRASELECTIVE  01/18/2017   IR ANGIOGRAM SELECTIVE EACH ADDITIONAL VESSEL  01/18/2017   IR AORTAGRAM ABDOMINAL SERIALOGRAM  01/18/2017   IR EMBO ARTERIAL NOT HEMORR HEMANG INC GUIDE ROADMAPPING  01/18/2017   IR RADIOLOGIST EVAL & MGMT  01/04/2017   IR RADIOLOGIST EVAL & MGMT  10/26/2017   IR RADIOLOGIST EVAL & MGMT  05/24/2018   IR RADIOLOGIST EVAL & MGMT  06/19/2019   IR US GUIDE VASC ACCESS RIGHT  01/18/2017   KNEE ARTHROSCOPY Left    x 2   LEFT HEART CATH AND CORS/GRAFTS ANGIOGRAPHY N/A 04/20/2017   Procedure: Left Heart Cath and Cors/Grafts Angiography;  Surgeon: Lorretta Harp, MD;  Location: [place] [facility];  LAD now occluded prior to SP1.LIMA-LAD. Patent RCA and circumflex stents. EF 45-50%. Relatively stable.    LEFT HEART CATHETERIZATION WITH CORONARY ANGIOGRAM N/A 05/15/2014   Procedure: LEFT HEART CATHETERIZATION WITH CORONARY ANGIOGRAM;  Surgeon: Sinclair Grooms, MD;   Location: [facility] C[facility];  Service: Cardiovascular;  Laterality: N/A;   LEFT HEART CATHETERIZATION WITH CORONARY/GRAFT ANGIOGRAM N/A 06/28/2014   Procedure: LEFT HEART CATHETERIZATION WITH Beatrix Fetters;  Surgeon: Troy Sine, MD;  Location: [facility] CATH LAB;  Service: Cardiovascular;  Laterality: N/A;   LUMBAR PERCUTANEOUS PEDICLE SCREW 1 LEVEL N/A 11/22/2012   Procedure: LUMBAR PERCUTANEOUS PEDICLE SCREW 1 LEVEL;  Surgeon: Eustace Moore, MD;  Location: [place] [facility];  Service: Neurosurgery;  Laterality: N/A;  Lumbar Three-Four Percutaneous Pedicle Screw, Lateral approach   MINOR HEMORRHOIDECTOMY     NM MYOVIEW LTD  December 2013   LOW RISK. Mmoderate region of mid to basal inferolateral scar without ischemia. Mild apical hypokinesis with an EF of 47%.   PERCUTANEOUS CORONARY STENT INTERVENTION (PCI-S)  September 2004   PCI - RCA 2 overlapping Taxus DES 2.75 mm x 32 mm and 2.75 mm x 12 mm (3.0 mm); PCI-Cx-OM1 - Taxus DES 3.0 mm x 20 mm (3.1 mm);    PERCUTANEOUS CORONARY STENT INTERVENTION (PCI-S)  12/2005   80% ISR in proximal Taxus stent in RCA -- covered proximally with Cypher DES 3.0 mm x 12 mm   PERCUTANEOUS CORONARY STENT INTERVENTION (PCI-S) N/A 05/17/2014   Procedure: PERCUTANEOUS CORONARY STENT INTERVENTION (PCI-S);  Surgeon: Sinclair Grooms, MD;  Location: [facility] CATH LAB: PCI pRCA stent ISR - Promus DES 3.0 x 8 (3.25), OM distal stent edge - Promus DES 2.75 x 12 (3.1)   PERIPHERAL VASCULAR CATHETERIZATION  11/03/2016   Procedure: Embolization;  Surgeon: Waynetta Sandy, MD;  Location: [place] [facility];  Service: Cardiovascular;;   POSTERIOR CERVICAL FUSION/FORAMINOTOMY N/A 05/02/2013   Procedure: POSTERIOR CERVICAL FUSION/FORAMINOTOMY CERVICAL SEVEN THORACIC-ONE;  Surgeon: Eustace Moore, MD;  Location: [place] [facility];  Service: Neurosurgery;  Laterality: N/A;  POSTERIOR CERVICAL FUSION/FORAMINOTOMY CERVICAL SEVEN THORACIC-ONE   SPHINCTEROTOMY N/A 06/30/2015   Procedure:  SPHINCTEROTOMY;  Surgeon: Ladene Artist, MD;  Location: [facility];  Service: Endoscopy;  Laterality: N/A;   TOTAL KNEE ARTHROPLASTY Right    TOTAL KNEE ARTHROPLASTY Left 12/20/2016   Procedure: LEFT TOTAL KNEE ARTHROPLASTY;  Surgeon: Vickey Huger, MD;  Location: [place];  Service: Orthopedics;  Laterality: Left;   TRANSTHORACIC ECHOCARDIOGRAM  07/2016   EF 45-50%. Inferolateral hypokinesis (correlates with scar noted on Myoview). GR 1 DD. Mild aortic stenosis. Mod LAE.   TRANSTHORACIC ECHOCARDIOGRAM  07/2018   Normal LV size.  EF 50 to 55% with mild HK of inferolateral wall.  GRII DD.  Mild  AS with mean gradient 15 mm or greater.  Mild ascending aortic dilation.  Mildly increased PA pressures of 41 mmHg   VISCERAL ANGIOGRAM  11/03/2016   Procedure: Visceral Angiogram;  Surgeon: Waynetta Sandy, MD;  Location: [place] [facility];  Service: Cardiovascular;;    Current Outpatient Medications  Medication Sig Dispense Refill   acetaminophen (TYLENOL) 500 MG tablet Take 1,000 mg by mouth every 6 (six) hours as needed for mild pain, moderate pain or headache. Reported on 03/16/2016     albuterol (PROVENTIL HFA;VENTOLIN HFA) 108 (90 Base) MCG/ACT inhaler Inhale 2 puffs into the lungs every 6 (six) hours as needed for wheezing or shortness of breath. 1 Inhaler 0   Alirocumab (PRALUENT [place]) Inject into the skin every 14 (fourteen) days.     amLODipine (NORVASC) 5 MG tablet Take 1 tablet by mouth once daily 30 tablet 0   aspirin EC 81 MG EC tablet Take 1 tablet (81 mg total) by mouth daily. 30 tablet 11   Choline Fenofibrate (FENOFIBRIC ACID) 135 MG CPDR Take 1 capsule by mouth daily. 90 capsule 2   clopidogrel (PLAVIX) 75 MG tablet TAKE 1 TABLET BY MOUTH ONCE DAILY 90 tablet 4   ezetimibe (ZETIA) 10 MG tablet Take 1 tablet by mouth once daily 90 tablet 1   glucose blood (ONETOUCH VERIO) test strip USE ONE STRIP TO CHECK GLUCOSE TWICE DAILY E11.9 180 each 2   Iron-Folic Acid-Vit 123456  (IRON FORMULA PO) Take 65 mg by mouth 1 day or 1 dose.     losartan (COZAAR) 100 MG tablet Take 1 tablet by mouth once daily 90 tablet 0   metFORMIN (GLUCOPHAGE) 500 MG tablet Take 1 tablet by mouth once daily with breakfast 90 tablet 0   Multiple Vitamins-Minerals (MULTIVITAMIN WITH MINERALS) tablet Take 1 tablet by mouth daily.      pantoprazole (PROTONIX) 40 MG tablet Take 1 tablet by mouth once daily 90 tablet 0   ranolazine (RANEXA) 500 MG 12 hr tablet Take 1 tablet (500 mg total) by mouth 2 (two) times daily. 60 tablet 5   sildenafil (REVATIO) 20 MG tablet TAKE 2-5 TABLETS BY MOUTH AS NEEDED FOR SEXUAL ACTIVITY 50 tablet 5   No current facility-administered medications for this visit.     Allergies as of 06/21/2019 - Review Complete 06/13/2019  Allergen Reaction Noted   Oxycodone Shortness Of Breath and Cough 01/11/2017   Lisinopril Cough 09/22/2012   Statins Other (See Comments) 09/22/2012   Welchol [colesevelam hcl] Itching 09/22/2012    Family History  Problem Relation Age of Onset   Arthritis Mother    Diabetes Father    Heart disease Father    Stroke Sister    Hypertension Sister    Heart disease Sister    Diabetes Sister    Breast cancer Sister    Heart disease Brother    Ulcers Brother    Colon cancer Neg Hx     Social History   Socioeconomic History   Marital status: Married    Spouse name: Not on file   Number of children: Not on file   Years of education: Not on file   Highest education level: Not on file  Occupational History   Not on file  Social Needs   Financial resource strain: Not on file   Food insecurity    Worry: Not on file    Inability: Not on file   Transportation needs    Medical: Not on file  Non-medical: Not on file  Tobacco Use   Smoking status: Former Smoker    Packs/day: 3.00    Years: 45.00    Pack years: 135.00    Types: Cigarettes    Quit date: 10/05/1983    Years since quitting: 35.7    Smokeless tobacco: Never Used  Substance and Sexual Activity   Alcohol use: Yes    Alcohol/week: 4.0 standard drinks    Types: 4 Glasses of wine per week    Comment:  4 oz. wine daily   Drug use: No   Sexual activity: Not Currently  Lifestyle   Physical activity    Days per week: Not on file    Minutes per session: Not on file   Stress: Not on file  Relationships   Social connections    Talks on phone: Not on file    Gets together: Not on file    Attends religious service: Not on file    Active member of club or organization: Not on file    Attends meetings of clubs or organizations: Not on file    Relationship status: Not on file   Intimate partner violence    Fear of current or ex partner: Not on file    Emotionally abused: Not on file    Physically abused: Not on file    Forced sexual activity: Not on file  Other Topics Concern   Not on file  Social History Narrative   He is married, father of two, grandfather to 30, great grandfather to two.    Not really getting much exercise now, do to his significant back and hip pain.    He does not smoke and only has an alcoholic beverage.     Review of Systems:    Constitutional: No weight loss, fever or chills Skin: No rash  Cardiovascular: No chest pain Respiratory: No SOB Gastrointestinal: See HPI and otherwise negative Genitourinary: No dysuria  Neurological: No headache, dizziness or syncope Musculoskeletal: No new muscle or joint pain Hematologic: No bleeding  Psychiatric: No history of depression or anxiety   Physical Exam:  Vital signs: BP 138/76    Pulse 84    Temp 98.2 F (36.8 C)    Ht 5\' 9"  (1.753 m)    Wt 216 lb 4 oz (98.1 kg)    BMI 31.93 kg/m   Constitutional:   Pleasant Caucasian male appears to be in NAD, Well developed, Well nourished, alert and cooperative Head:  Normocephalic and atraumatic. Eyes:   PEERL, EOMI. No icterus. Conjunctiva pink. Ears:  Normal auditory acuity. Neck:   Supple Throat: Oral cavity and pharynx without inflammation, swelling or lesion.  Respiratory: Respirations even and unlabored. Lungs clear to auscultation bilaterally.   No wheezes, crackles, or rhonchi.  Cardiovascular: Normal S1, S2. No MRG. Regular rate and rhythm. No peripheral edema, cyanosis or pallor.  Gastrointestinal:  Soft, nondistended, nontender. No rebound or guarding. Normal bowel sounds. No appreciable masses or hepatomegaly. Rectal:  Not performed.  Msk:  Symmetrical without gross deformities. Without edema, no deformity or joint abnormality.  Neurologic:  Alert and  oriented x4;  grossly normal neurologically.  Skin:   Dry and intact without significant lesions or rashes. Psychiatric: Demonstrates good judgement and reason without abnormal affect or behaviors.  See HPI for recent imaging.  Assessment: 1.  Abnormal imaging of the stomach: CT Angio of the abdomen and pelvis 06/14/2019 concerning for gastric mass versus gastritis 2.  Chronic anticoagulation: For CAD status post stent  on Plavix 3.  Aortic aneurysm: Followed by IR  Plan: 1.  Scheduled patient for an EGD in the [place] with Dr. Fuller Plan.  Did discuss risks, benefits, limitations and alternatives and the patient agrees to proceed. 2.  Patient was advised to hold his Plavix for 5 days prior to time procedure.  We will contact his prescribing physician to ensure that holding his Plavix is acceptable for him. 3.  Patient to follow in clinic per recommendations from Dr. Fuller Plan after time of procedure.  Ellouise Newer, PA-C [facility] 06/21/2019, 11:25 AM  Cc: Julian Lung, MD

## 2019-06-24 NOTE — Telephone Encounter (Signed)
Please review last clinic note -- I clearly indicated that Plavix can be held.  I make a point of trying to comment on this question on all my patients -- but this one was specifically for Pre-op evaluation.  We could save a couple of steps here -- pls ask your colleagues to review my last clinic notes of my patients before simply sending the ? Can patient hold med ? Message.  Glenetta Hew c

## 2019-06-25 NOTE — Telephone Encounter (Signed)
   Primary Cardiologist: Glenetta Hew, MD  Chart reviewed as part of pre-operative protocol coverage. [name] was last seen on 05/24/2019 by Dr. Ellyn Hack at which time preop risk was addressed (see office note).   According to the revised cardiac risk index [name] is a class II-III risk (taking into consideration history of CAD although it is stable) --> intermediate risk patient for low risk surgery. Estimated Risk of MI, PE, VF/VT (Cardiac Arrest), Complete Heart Block: 6-10 % The patient is able to complete roughly 6-8 METS of activity.  Therefore, based on ACC/AHA guidelines, the patient would be at acceptable risk for the planned procedure without further cardiovascular testing.   The patient is not on a beta-blocker because of issue with bradycardia.  It is okay to hold Plavix for 5-7 days preoperatively.  I will route this recommendation to the requesting party via Epic fax function and remove from pre-op pool.  Please call with questions.  Daune Perch, NP 06/25/2019, 10:50 AM

## 2019-07-02 ENCOUNTER — Telehealth: Payer: Self-pay

## 2019-07-02 NOTE — Telephone Encounter (Signed)
Covid-19 screening questions   Do you now or have you had a fever in the last 14 days? NO   Do you have any respiratory symptoms of shortness of breath or cough now or in the last 14 days? NO  Do you have any family members or close contacts with diagnosed or suspected Covid-19 in the past 14 days? NO  Have you been tested for Covid-19 and found to be positive? NO        

## 2019-07-03 ENCOUNTER — Other Ambulatory Visit: Payer: Self-pay | Admitting: Gastroenterology

## 2019-07-03 ENCOUNTER — Ambulatory Visit (AMBULATORY_SURGERY_CENTER): Payer: Medicare Other | Admitting: Gastroenterology

## 2019-07-03 ENCOUNTER — Telehealth: Payer: Self-pay | Admitting: Gastroenterology

## 2019-07-03 ENCOUNTER — Other Ambulatory Visit: Payer: Self-pay

## 2019-07-03 ENCOUNTER — Encounter: Payer: Self-pay | Admitting: Gastroenterology

## 2019-07-03 VITALS — BP 106/76 | HR 74 | Temp 97.8°F | Resp 25 | Ht 69.0 in | Wt 216.0 lb

## 2019-07-03 DIAGNOSIS — R933 Abnormal findings on diagnostic imaging of other parts of digestive tract: Secondary | ICD-10-CM | POA: Diagnosis not present

## 2019-07-03 DIAGNOSIS — K317 Polyp of stomach and duodenum: Secondary | ICD-10-CM

## 2019-07-03 DIAGNOSIS — K2970 Gastritis, unspecified, without bleeding: Secondary | ICD-10-CM | POA: Diagnosis not present

## 2019-07-03 DIAGNOSIS — G4733 Obstructive sleep apnea (adult) (pediatric): Secondary | ICD-10-CM | POA: Diagnosis not present

## 2019-07-03 DIAGNOSIS — K449 Diaphragmatic hernia without obstruction or gangrene: Secondary | ICD-10-CM | POA: Diagnosis not present

## 2019-07-03 DIAGNOSIS — I2510 Atherosclerotic heart disease of native coronary artery without angina pectoris: Secondary | ICD-10-CM | POA: Diagnosis not present

## 2019-07-03 HISTORY — PX: UPPER GI ENDOSCOPY: SHX6162

## 2019-07-03 MED ORDER — SODIUM CHLORIDE 0.9 % IV SOLN: 500.0000 mL | Freq: Once | INTRAVENOUS | Status: DC

## 2019-07-03 NOTE — Telephone Encounter (Signed)
EGD with gastric polyp removed today. This evening his wife reports a temp of 100.8 with chills that decreased to 99.5 and chills resolved. She report pt has had a cough for several days that is still present today. No GI complaints. No CP, SOB.  Advised Tylenol q4-6 hours, fluids, rest tonight. If symptoms persist call back, seek care in ED or contact PCP tomorrow.

## 2019-07-03 NOTE — Patient Instructions (Signed)
Thank you for letting us take care of your healthcare needs today. Clear liquid diet for 2 hours, then advance to soft diet for 2-3 days, then advance diet gradually.No Ibuprofen, aspirin products for 2 weeks. You may resume your Plavix on Friday.    YOU HAD AN ENDOSCOPIC PROCEDURE TODAY AT [facility]:   Refer to the procedure report that was given to you for any specific questions about what was found during the examination.  If the procedure report does not answer your questions, please call your gastroenterologist to clarify.  If you requested that your care partner not be given the details of your procedure findings, then the procedure report has been included in a sealed envelope for you to review at your convenience later.  YOU SHOULD EXPECT: Some feelings of bloating in the abdomen. Passage of more gas than usual.  Walking can help get rid of the air that was put into your GI tract during the procedure and reduce the bloating. If you had a lower endoscopy (such as a colonoscopy or flexible sigmoidoscopy) you may notice spotting of blood in your stool or on the toilet paper. If you underwent a bowel prep for your procedure, you may not have a normal bowel movement for a few days.  Please Note:  You might notice some irritation and congestion in your nose or some drainage.  This is from the oxygen used during your procedure.  There is no need for concern and it should clear up in a day or so.  SYMPTOMS TO REPORT IMMEDIATELY:    Following upper endoscopy (EGD)  Vomiting of blood or coffee ground material  New chest pain or pain under the shoulder blades  Painful or persistently difficult swallowing  New shortness of breath  Fever of 100F or higher  Black, tarry-looking stools  For urgent or emergent issues, a gastroenterologist can be reached at any hour by calling [phone].   DIET:  Clear liquids for 2 hours then soft diet for 2-3 days.  Drink plenty of fluids  but you should avoid alcoholic beverages for 24 hours.  ACTIVITY:  You should plan to take it easy for the rest of today and you should NOT DRIVE or use heavy machinery until tomorrow (because of the sedation medicines used during the test).    FOLLOW UP: Our staff will call the number listed on your records 48-72 hours following your procedure to check on you and address any questions or concerns that you may have regarding the information given to you following your procedure. If we do not reach you, we will leave a message.  We will attempt to reach you two times.  During this call, we will ask if you have developed any symptoms of COVID 19. If you develop any symptoms (ie: fever, flu-like symptoms, shortness of breath, cough etc.) before then, please call [phone].  If you test positive for Covid 19 in the 2 weeks post procedure, please call and report this information to [place].    If any biopsies were taken you will be contacted by phone or by letter within the next 1-3 weeks.  Please call us at [phone] if you have not heard about the biopsies in 3 weeks.    SIGNATURES/CONFIDENTIALITY: You and/or your care partner have signed paperwork which will be entered into your electronic medical record.  These signatures attest to the fact that that the information above on your After Visit Summary has been reviewed and  is understood.  Full responsibility of the confidentiality of this discharge information lies with you and/or your care-partner.

## 2019-07-03 NOTE — Op Note (Signed)
[place] Patient Name: [name] Procedure Date: 07/03/2019 9:59 AM MRN: [id number] Endoscopist: Ladene Artist , MD Age: 83 Referring MD:  Date of Birth: Jan 19, 1936 Gender: Male Account #: [id number] Procedure:                Upper GI endoscopy Indications:              Abnormal CT of the GI tract (stomach - abnormal                            area in hiatal hernia) Medicines:                Monitored Anesthesia Care Procedure:                Pre-Anesthesia Assessment:                           - Prior to the procedure, a History and Physical                            was performed, and patient medications and                            allergies were reviewed. The patient's tolerance of                            previous anesthesia was also reviewed. The risks                            and benefits of the procedure and the sedation                            options and risks were discussed with the patient.                            All questions were answered, and informed consent                            was obtained. Prior Anticoagulants: The patient has                            taken Plavix (clopidogrel), last dose was 5 days                            prior to procedure. ASA Grade Assessment: III - A                            patient with severe systemic disease. After                            reviewing the risks and benefits, the patient was                            deemed in satisfactory condition to undergo the  procedure.                           After obtaining informed consent, the endoscope was                            passed under direct vision. Throughout the                            procedure, the patient's blood pressure, pulse, and                            oxygen saturations were monitored continuously. The                            Endoscope was introduced through the mouth, and         advanced to the second part of duodenum. The upper                            GI endoscopy was accomplished without difficulty.                            The patient tolerated the procedure well. Scope In: Scope Out: Findings:                 The examined esophagus was normal.                           A large hiatal hernia was present.                           One 14 mm sessile polyp with bleeding and stigmata                            of recent bleeding was found in the cardia in the                            hiatal hernia.                           Diffuse nodular friable mucosa was found in the                            cardia near the polyectomy site. Biopsies were                            taken with a cold forceps for histology.                           The exam of the stomach was otherwise normal.                           The duodenal bulb and second portion of the  duodenum were normal. Complications:            No immediate complications. Estimated Blood Loss:     Estimated blood loss was minimal. Impression:               - Normal esophagus.                           - Large hiatal hernia.                           - One bleeding gastric polyp in the hiatal hernia.                            Removed, rertieved.                           - Nodular friable mucosa in the cardia at the site                            of the polyectomy. Biopsied.                           - Normal duodenal bulb and second portion of the                            duodenum. Recommendation:           - Patient has a contact number available for                            emergencies. The signs and symptoms of potential                            delayed complications were discussed with the                            patient. Return to normal activities tomorrow.                            Written discharge instructions were provided to the                             patient.                           - Clear liquid diet for 2 hours, then advance as                            tolerated to soft diet for 2 - 3 days. Then advance                            to prior diet gradually                           - No aspirin, ibuprofen, naproxen, or other  non-steroidal anti-inflammatory drugs for 2 weeks                            after polyp removal.                           - Await pathology results.                           - Continue current medications including                            pantoprazole 40 mg daily.                           - GI office appt in 4 weeks. Ladene Artist, MD 07/03/2019 10:53:01 AM This report has been signed electronically.

## 2019-07-03 NOTE — Progress Notes (Signed)
Called to room to assist during endoscopic procedure.  Patient ID and intended procedure confirmed with present staff. Received instructions for my participation in the procedure from the performing physician.  

## 2019-07-05 ENCOUNTER — Telehealth: Payer: Self-pay

## 2019-07-05 DIAGNOSIS — Z20828 Contact with and (suspected) exposure to other viral communicable diseases: Secondary | ICD-10-CM | POA: Diagnosis not present

## 2019-07-05 DIAGNOSIS — R519 Headache, unspecified: Secondary | ICD-10-CM | POA: Diagnosis not present

## 2019-07-05 DIAGNOSIS — Z79899 Other long term (current) drug therapy: Secondary | ICD-10-CM

## 2019-07-05 DIAGNOSIS — R509 Fever, unspecified: Secondary | ICD-10-CM | POA: Diagnosis not present

## 2019-07-05 DIAGNOSIS — R05 Cough: Secondary | ICD-10-CM | POA: Diagnosis not present

## 2019-07-05 DIAGNOSIS — E785 Hyperlipidemia, unspecified: Secondary | ICD-10-CM

## 2019-07-05 NOTE — Telephone Encounter (Signed)
Left message on answering machine. 

## 2019-07-05 NOTE — Telephone Encounter (Signed)
-----   Message from [facility] sent at 07/05/2019  8:17 AM EDT ----- Regarding: FW: trial with Nexletol Please call and check if tolerating Nexletol samples. Please process Rx if tolerating.  Raquel  ----- Message ----- From: Harrington Challenger, RPH Sent: 06/18/2019 To: Roxanne Mins Rodriguez-Guzman, RPH Subject: trial with Nexletol                            2 wks Nexletol samples given on 06/04/2019. Tolerating okay?

## 2019-07-05 NOTE — Telephone Encounter (Signed)
Spoke w/pt's wife and they stated that his right foot has pain in his right foot middle toe took three pills. Pt was also removed from blood thinner. Pt was worried since the side effects caused gout and was afraid that was happening

## 2019-07-05 NOTE — Telephone Encounter (Signed)
Patient never had gout but developed pain in toes and knee. No swelling, redness or increased temperature.  He had emergency surgery this week, but will stop by office ASAP to get complete blood work for lipids and uric acid.

## 2019-07-05 NOTE — Telephone Encounter (Signed)
  Follow up Call-  Call back number 07/03/2019  Post procedure Call Back phone  # [phone]  Permission to leave phone message Yes  Some recent data might be hidden     Patient questions:  Do you have a fever, pain , or abdominal swelling? No. Pain Score  0 *  Have you tolerated food without any problems? Yes.    Have you been able to return to your normal activities? Yes.    Do you have any questions about your discharge instructions: Diet   No. Medications  No. Follow up visit  No.  Do you have questions or concerns about your Care? No.  Actions: * If pain score is 4 or above: No action needed, pain <4.  Patient called back after first call back and stated after procedure Temp. 100.8 with chills. Called Doctor on call and was instructed to take tylenol. The next day Temp. 99.2 and develop coughing spell. Patient was a EGD. Last night fever broke and doing well today. Instructed to call back if symptoms worsen and if spike a Temp.

## 2019-07-05 NOTE — Addendum Note (Signed)
Addended by: Harrington Challenger on: 07/05/2019 08:49 AM   Modules accepted: Orders

## 2019-07-06 NOTE — Telephone Encounter (Signed)
I guess we just do not have any options  Glenetta Hew, MD

## 2019-07-10 ENCOUNTER — Encounter: Payer: Self-pay | Admitting: Gastroenterology

## 2019-07-23 ENCOUNTER — Telehealth: Payer: Self-pay | Admitting: Cardiology

## 2019-07-23 MED ORDER — AMLODIPINE BESYLATE 5 MG PO TABS: 5.0000 mg | ORAL_TABLET | Freq: Every day | ORAL | 3 refills | Status: DC

## 2019-07-23 NOTE — Telephone Encounter (Signed)
New message   Per patient's wife patient would like a 90 day prescription of amLODipine (NORVASC) 5 MG tablet sent to [facility] [place] [facility] [place] and wants a 90 day supply.

## 2019-07-23 NOTE — Telephone Encounter (Signed)
Rx(s) sent to pharmacy electronically.  

## 2019-08-04 DIAGNOSIS — R05 Cough: Secondary | ICD-10-CM | POA: Diagnosis not present

## 2019-08-04 DIAGNOSIS — J209 Acute bronchitis, unspecified: Secondary | ICD-10-CM | POA: Diagnosis not present

## 2019-08-07 ENCOUNTER — Ambulatory Visit: Payer: Medicare Other | Admitting: Gastroenterology

## 2019-08-29 ENCOUNTER — Other Ambulatory Visit: Payer: Self-pay

## 2019-09-05 ENCOUNTER — Other Ambulatory Visit: Payer: Self-pay

## 2019-09-05 ENCOUNTER — Encounter: Payer: Self-pay | Admitting: Gastroenterology

## 2019-09-05 ENCOUNTER — Ambulatory Visit (INDEPENDENT_AMBULATORY_CARE_PROVIDER_SITE_OTHER): Payer: Medicare Other | Admitting: Gastroenterology

## 2019-09-05 VITALS — BP 128/77 | HR 71 | Temp 97.6°F | Ht 69.0 in | Wt 213.4 lb

## 2019-09-05 DIAGNOSIS — K317 Polyp of stomach and duodenum: Secondary | ICD-10-CM | POA: Diagnosis not present

## 2019-09-05 DIAGNOSIS — I25119 Atherosclerotic heart disease of native coronary artery with unspecified angina pectoris: Secondary | ICD-10-CM

## 2019-09-05 DIAGNOSIS — K219 Gastro-esophageal reflux disease without esophagitis: Secondary | ICD-10-CM

## 2019-09-05 NOTE — Patient Instructions (Signed)
Thank you for choosing me and [facility].  Malcolm T. Stark, Jr., MD., FACG  

## 2019-09-05 NOTE — Progress Notes (Signed)
    History of Present Illness: This is an 83 year old male returning for follow-up after removal of a gastric polyp.  His reflux symptoms are well controlled.  He states he had an illness last week with urgency and nonbloody diarrhea.  His wife had similar symptoms.  Symptoms completely resolved and he has felt back to normal over the past several days.  EGD 07/03/2019 - Normal esophagus. - Large hiatal hernia. - One bleeding gastric polyp in the hiatal hernia. Removed, rertieved. - Nodular friable mucosa in the cardia at the site of the polyectomy. Biopsied. - Normal duodenal bulb and second portion of the duodenum.  1. Stomach, polyp(s), hiatal hernia - HYPERPLASTIC GASTRIC POLYP WITH INFLAMMATION AND REACTIVE ATYPIA - NO H. PYLORI, INTESTINAL METAPLASIA OR MALIGNANCY IDENTIFIED 2. Stomach, biopsy, hiatal hernia, site of polyp - BENIGN SQUAMOUS EPITHELIUM AND GASTRIC MUCOSA - NO MALIGNANCY IDENTIFIED - SEE COMMENT  Current Medications, Allergies, Past Medical History, Past Surgical History, Family History and Social History were reviewed in [employer] record.   Physical Exam: General: Well developed, well nourished, no acute distress Head: Normocephalic and atraumatic Eyes:  sclerae anicteric, EOMI Ears: Normal auditory acuity Mouth: No deformity or lesions Lungs: Clear throughout to auscultation Heart: Regular rate and rhythm; no murmurs, rubs or bruits Abdomen: Soft, non tender and non distended. No masses, hepatosplenomegaly or hernias noted. Normal Bowel sounds Rectal: Not done Musculoskeletal: Symmetrical with no gross deformities  Pulses:  Normal pulses noted Extremities: No clubbing, cyanosis, edema or deformities noted Neurological: Alert oriented x 4, grossly nonfocal Psychological:  Alert and cooperative. Normal mood and affect   Assessment and Recommendations:  1. Hyperplastic gastric polyp removed at EGD in September.  No further  follow-up needed.  2. GERD.  Follow standard antireflux measures and continue pantoprazole 40 mg daily.  3.  Self-limited diarrheal illness likely infectious.

## 2019-09-16 ENCOUNTER — Other Ambulatory Visit: Payer: Self-pay | Admitting: Cardiology

## 2019-09-20 ENCOUNTER — Other Ambulatory Visit: Payer: Self-pay

## 2019-09-20 MED ORDER — LOSARTAN POTASSIUM 100 MG PO TABS: 100.0000 mg | ORAL_TABLET | Freq: Every day | ORAL | 2 refills | Status: DC

## 2019-10-15 ENCOUNTER — Other Ambulatory Visit: Payer: Self-pay | Admitting: Family Medicine

## 2019-10-15 ENCOUNTER — Telehealth: Payer: Self-pay

## 2019-10-15 DIAGNOSIS — E118 Type 2 diabetes mellitus with unspecified complications: Secondary | ICD-10-CM

## 2019-10-15 NOTE — Telephone Encounter (Signed)
lvm for pt to call and schedule a diabetes check. Kh

## 2019-10-21 ENCOUNTER — Other Ambulatory Visit: Payer: Self-pay | Admitting: Cardiology

## 2019-10-24 ENCOUNTER — Other Ambulatory Visit: Payer: Self-pay

## 2019-11-03 ENCOUNTER — Ambulatory Visit: Payer: Medicare Other

## 2019-11-09 ENCOUNTER — Ambulatory Visit: Payer: Medicare Other | Attending: Internal Medicine

## 2019-11-09 DIAGNOSIS — Z23 Encounter for immunization: Secondary | ICD-10-CM | POA: Insufficient documentation

## 2019-11-09 NOTE — Progress Notes (Signed)
   Covid-19 Vaccination Clinic  Name:  [name]    MRN: [id number] DOB: [birth year]  11/09/2019  [name] was observed post Covid-19 immunization for 15 minutes without incidence. He was provided with Vaccine Information Sheet and instruction to access the V-Safe system.   [name] was instructed to call 911 with any severe reactions post vaccine: Marland Kitchen Difficulty breathing  . Swelling of your face and throat  . A fast heartbeat  . A bad rash all over your body  . Dizziness and weakness    Immunizations Administered    Name Date Dose VIS Date Route   Pfizer COVID-19 Vaccine 11/09/2019 12:49 PM 0.3 mL 09/14/2019 Intramuscular   Manufacturer: [place]   Lot: [id number]   [place]: [id number]

## 2019-11-14 ENCOUNTER — Other Ambulatory Visit (HOSPITAL_COMMUNITY)
Admission: RE | Admit: 2019-11-14 | Discharge: 2019-11-14 | Disposition: A | Discharge status: Home / Self Care | Payer: Medicare Other | Source: Ambulatory Visit | Attending: Orthopaedic Surgery | Admitting: Orthopaedic Surgery

## 2019-11-14 ENCOUNTER — Other Ambulatory Visit: Payer: Self-pay | Admitting: Cardiology

## 2019-11-14 ENCOUNTER — Other Ambulatory Visit: Payer: Self-pay | Admitting: Physician Assistant

## 2019-11-14 ENCOUNTER — Encounter (HOSPITAL_COMMUNITY): Payer: Self-pay

## 2019-11-14 DIAGNOSIS — Z01812 Encounter for preprocedural laboratory examination: Secondary | ICD-10-CM | POA: Insufficient documentation

## 2019-11-14 DIAGNOSIS — Z20822 Contact with and (suspected) exposure to covid-19: Secondary | ICD-10-CM | POA: Insufficient documentation

## 2019-11-14 LAB — SARS CORONAVIRUS 2 (TAT 6-24 HRS): SARS Coronavirus 2: NEGATIVE

## 2019-11-14 MED ORDER — FENOFIBRIC ACID 135 MG PO CPDR: 1.0000 | DELAYED_RELEASE_CAPSULE | Freq: Every day | ORAL | 1 refills | Status: DC

## 2019-11-14 NOTE — Patient Instructions (Addendum)
DUE TO COVID-19 ONLY ONE VISITOR IS ALLOWED TO COME WITH YOU AND STAY IN THE WAITING ROOM ONLY DURING PRE OP AND PROCEDURE. THE ONE VISITOR MAY VISIT WITH YOU IN YOUR PRIVATE ROOM DURING VISITING HOURS ONLY!!   COVID SWAB TESTING COMPLETED ON: Wednesday, Feb. 10, 2021      (Must self quarantine after testing. Follow instructions on handout.)             Your procedure is scheduled on: Friday, Feb. 12, 2021   Report to [facility] Main  Entrance    Report to admitting at 11:15 AM   Call this number if you have problems the morning of surgery [phone]     Do not eat food:After Midnight. May have liquids until 7:15 AM day of surgery. Complete one Gatorade drink the morning of surgery 3 hours prior to scheduled surgery.Please have this completed by 10:25 AM   CLEAR LIQUID DIET  Foods Allowed                                                                     Foods Excluded  Water, Black Coffee and tea, regular and decaf                             liquids that you cannot  Plain Jell-O in any flavor  (No red)                                           see through such as: Fruit ices (not with fruit pulp)                                     milk, soups, orange juice  Iced Popsicles (No red)                                    All solid food Carbonated beverages, regular and diet                                    Apple juices Sports drinks like Gatorade (No red) Lightly seasoned clear broth or consume(fat free) Sugar, honey syrup  Sample Menu Breakfast                                Lunch                                     Supper Cranberry juice                    Beef broth  Chicken broth Jell-O                                     Grape juice                           Apple juice Coffee or tea                        Jell-O                                      Popsicle                                                Coffee or tea                         Coffee or tea       Oral Hygiene is also important to reduce your risk of infection.                                     Remember - BRUSH YOUR TEETH THE MORNING OF SURGERY WITH YOUR REGULAR TOOTHPASTE   Do NOT smoke after Midnight   Take these medicines the morning of surgery with A SIP OF WATER: AMLODIPINE, CHOLINE FENOFIBRATE, EZETIMIBE, PANTOPRAZOLE, RANOLAZINE   DO NOT USE SILDENAFIL 24 HOURS PRIOR TO SURGERY  DO NOT TAKE ANY DIABETIC MEDICATIONS DAY OF YOUR SURGERY                               You may not have any metal on your body including jewelry, and body piercings             Do not wear lotions, powders, perfumes/cologne, or deodorant                          Men may shave face and neck.   Do not bring valuables to the hospital. [place].   Contacts, dentures or bridgework may not be worn into surgery.      Patients discharged the day of surgery will not be allowed to drive home.   Name and phone number of your driver: Ishan Giammanco [phone]   Special Instructions: Bring a copy of your healthcare power of attorney and living will documents  the day of surgery if you haven't scanned them in before.              Please read over the following fact sheets you were given:  How to Manage Your Diabetes Before and After Surgery  Why is it important to control my blood sugar before and after surgery? . Improving blood sugar levels before and after surgery helps healing and can limit problems. . A way of improving blood sugar control is eating a healthy diet by: o  Eating less sugar and carbohydrates o  Increasing activity/exercise o  Talking with your doctor about reaching your blood sugar goals . High blood sugars (greater than 180 mg/dL) can raise your risk of infections and slow your recovery, so you will need to focus on controlling your diabetes during the weeks before surgery. . Make sure that the doctor  who takes care of your diabetes knows about your planned surgery including the date and location.  How do I manage my blood sugar before surgery? . Check your blood sugar at least 4 times a day, starting 2 days before surgery, to make sure that the level is not too high or low. o Check your blood sugar the morning of your surgery when you wake up and every 2 hours until you get to the Short Stay unit. . If your blood sugar is less than 70 mg/dL, you will need to treat for low blood sugar: o Do not take insulin. o Treat a low blood sugar (less than 70 mg/dL) with  cup of clear juice (cranberry or apple), 4 glucose tablets, OR glucose gel. o Recheck blood sugar in 15 minutes after treatment (to make sure it is greater than 70 mg/dL). If your blood sugar is not greater than 70 mg/dL on recheck, call [phone] for further instructions. . Report your blood sugar to the short stay nurse when you get to Short Stay.  . If you are admitted to the hospital after surgery: o Your blood sugar will be checked by the staff and you will probably be given insulin after surgery (instead of oral diabetes medicines) to make sure you have good blood sugar levels. o The goal for blood sugar control after surgery is 80-180 mg/dL.   WHAT DO I DO ABOUT MY DIABETES MEDICATION?  Marland Kitchen Do not take oral diabetes medicines (pills) the morning of surgery.  . THE DAY BEFORE SURGERY, take   METFORMIN PER NORMAL ROUTINE    Reviewed and Endorsed by [facility] Patient Education Committee, August 2015  [facility] - Preparing for Surgery Before surgery, you can play an important role.  Because skin is not sterile, your skin needs to be as free of germs as possible.  You can reduce the number of germs on your skin by washing with CHG (chlorahexidine gluconate) soap before surgery.  CHG is an antiseptic cleaner which kills germs and bonds with the skin to continue killing germs even after washing. Please DO NOT use if you have  an allergy to CHG or antibacterial soaps.  If your skin becomes reddened/irritated stop using the CHG and inform your nurse when you arrive at Short Stay. Do not shave (including legs and underarms) for at least 48 hours prior to the first CHG shower.  You may shave your face/neck.  Please follow these instructions carefully:  1.  Shower with CHG Soap the night before surgery and the  morning of surgery.  2.  If you choose to wash your hair, wash your hair first as usual with your normal  shampoo.  3.  After you shampoo, rinse your hair and body thoroughly to remove the shampoo.                             4.  Use CHG as you would any other liquid soap.  You can apply chg directly to the skin and wash.  Gently with a scrungie or clean washcloth.  5.  Apply the CHG Soap to your body ONLY FROM THE NECK DOWN.   Do   not use on face/ open                           Wound or open sores. Avoid contact with eyes, ears mouth and   genitals (private parts).                       Wash face,  Genitals (private parts) with your normal soap.             6.  Wash thoroughly, paying special attention to the area where your    surgery  will be performed.  7.  Thoroughly rinse your body with warm water from the neck down.  8.  DO NOT shower/wash with your normal soap after using and rinsing off the CHG Soap.                9.  Pat yourself dry with a clean towel.            10.  Wear clean pajamas.            11.  Place clean sheets on your bed the night of your first shower and do not  sleep with pets. Day of Surgery : Do not apply any lotions/deodorants the morning of surgery.  Please wear clean clothes to the hospital/surgery center.  FAILURE TO FOLLOW THESE INSTRUCTIONS MAY RESULT IN THE CANCELLATION OF YOUR SURGERY  PATIENT SIGNATURE_________________________________  NURSE SIGNATURE__________________________________  ________________________________________________________________________

## 2019-11-14 NOTE — Telephone Encounter (Signed)
*  STAT* If patient is at the pharmacy, call can be transferred to refill team.   1. Which medications need to be refilled? (please list name of each medication and dose if known)  Choline Fenofibrate (FENOFIBRIC ACID) 135 MG CPDR  2. Which pharmacy/location (including street and city if local pharmacy) is medication to be sent to? [place]  3. Do they need a 30 day or 90 day supply? 90 day

## 2019-11-15 ENCOUNTER — Encounter (HOSPITAL_COMMUNITY)
Admission: RE | Admit: 2019-11-15 | Discharge: 2019-11-15 | Disposition: A | Discharge status: Home / Self Care | Payer: Medicare Other | Source: Ambulatory Visit | Attending: Orthopaedic Surgery | Admitting: Orthopaedic Surgery

## 2019-11-15 ENCOUNTER — Other Ambulatory Visit: Payer: Self-pay

## 2019-11-15 ENCOUNTER — Encounter (HOSPITAL_COMMUNITY): Payer: Self-pay

## 2019-11-15 DIAGNOSIS — I252 Old myocardial infarction: Secondary | ICD-10-CM | POA: Insufficient documentation

## 2019-11-15 DIAGNOSIS — Z955 Presence of coronary angioplasty implant and graft: Secondary | ICD-10-CM | POA: Insufficient documentation

## 2019-11-15 DIAGNOSIS — K219 Gastro-esophageal reflux disease without esophagitis: Secondary | ICD-10-CM | POA: Insufficient documentation

## 2019-11-15 DIAGNOSIS — N289 Disorder of kidney and ureter, unspecified: Secondary | ICD-10-CM | POA: Diagnosis not present

## 2019-11-15 DIAGNOSIS — Z96653 Presence of artificial knee joint, bilateral: Secondary | ICD-10-CM | POA: Insufficient documentation

## 2019-11-15 DIAGNOSIS — Z981 Arthrodesis status: Secondary | ICD-10-CM | POA: Insufficient documentation

## 2019-11-15 DIAGNOSIS — Z951 Presence of aortocoronary bypass graft: Secondary | ICD-10-CM | POA: Insufficient documentation

## 2019-11-15 DIAGNOSIS — Z79899 Other long term (current) drug therapy: Secondary | ICD-10-CM | POA: Insufficient documentation

## 2019-11-15 DIAGNOSIS — E119 Type 2 diabetes mellitus without complications: Secondary | ICD-10-CM | POA: Diagnosis not present

## 2019-11-15 DIAGNOSIS — N529 Male erectile dysfunction, unspecified: Secondary | ICD-10-CM | POA: Insufficient documentation

## 2019-11-15 DIAGNOSIS — Z7982 Long term (current) use of aspirin: Secondary | ICD-10-CM | POA: Diagnosis not present

## 2019-11-15 DIAGNOSIS — J45909 Unspecified asthma, uncomplicated: Secondary | ICD-10-CM | POA: Insufficient documentation

## 2019-11-15 DIAGNOSIS — I509 Heart failure, unspecified: Secondary | ICD-10-CM | POA: Diagnosis not present

## 2019-11-15 DIAGNOSIS — I110 Hypertensive heart disease with heart failure: Secondary | ICD-10-CM | POA: Diagnosis not present

## 2019-11-15 DIAGNOSIS — G5602 Carpal tunnel syndrome, left upper limb: Secondary | ICD-10-CM | POA: Diagnosis not present

## 2019-11-15 DIAGNOSIS — Z7984 Long term (current) use of oral hypoglycemic drugs: Secondary | ICD-10-CM | POA: Insufficient documentation

## 2019-11-15 DIAGNOSIS — E785 Hyperlipidemia, unspecified: Secondary | ICD-10-CM | POA: Insufficient documentation

## 2019-11-15 DIAGNOSIS — Z01812 Encounter for preprocedural laboratory examination: Secondary | ICD-10-CM | POA: Insufficient documentation

## 2019-11-15 DIAGNOSIS — G4730 Sleep apnea, unspecified: Secondary | ICD-10-CM | POA: Insufficient documentation

## 2019-11-15 DIAGNOSIS — I2510 Atherosclerotic heart disease of native coronary artery without angina pectoris: Secondary | ICD-10-CM | POA: Insufficient documentation

## 2019-11-15 HISTORY — DX: Other ill-defined heart diseases: I51.89

## 2019-11-15 LAB — CBC
HCT: 37.2 % — ABNORMAL LOW (ref 39.0–52.0)
Hemoglobin: 12 g/dL — ABNORMAL LOW (ref 13.0–17.0)
MCH: 31.5 pg (ref 26.0–34.0)
MCHC: 32.3 g/dL (ref 30.0–36.0)
MCV: 97.6 fL (ref 80.0–100.0)
Platelets: 195 10*3/uL (ref 150–400)
RBC: 3.81 MIL/uL — ABNORMAL LOW (ref 4.22–5.81)
RDW: 14.6 % (ref 11.5–15.5)
WBC: 5.2 10*3/uL (ref 4.0–10.5)
nRBC: 0 % (ref 0.0–0.2)

## 2019-11-15 LAB — BASIC METABOLIC PANEL
Anion gap: 6 (ref 5–15)
BUN: 25 mg/dL — ABNORMAL HIGH (ref 8–23)
CO2: 23 mmol/L (ref 22–32)
Calcium: 9 mg/dL (ref 8.9–10.3)
Chloride: 109 mmol/L (ref 98–111)
Creatinine, Ser: 1.65 mg/dL — ABNORMAL HIGH (ref 0.61–1.24)
GFR calc Af Amer: 44 mL/min — ABNORMAL LOW (ref >60)
GFR calc non Af Amer: 38 mL/min — ABNORMAL LOW (ref >60)
Glucose, Bld: 114 mg/dL — ABNORMAL HIGH (ref 70–99)
Potassium: 4.7 mmol/L (ref 3.5–5.1)
Sodium: 138 mmol/L (ref 135–145)

## 2019-11-15 LAB — HEMOGLOBIN A1C
Hgb A1c MFr Bld: 5.8 % — ABNORMAL HIGH (ref 4.8–5.6)
Mean Plasma Glucose: 119.76 mg/dL

## 2019-11-15 NOTE — Progress Notes (Signed)
PCP - Dr. Glo Herring Cardiologist - Dr. D Harding/LOV 05-24-19  Chest x-ray -  EKG - 05-24-19 Stress Test -  ECHO - 07-22-18 Cardiac Cath -   Sleep Study -  CPAP -   Fasting Blood Sugar -  Checks Blood Sugar _____ times a day  Blood Thinner Instructions: Plavix to hold x 7 days Aspirin Instructions: 81 mg, to hold x 7 days Last Dose: 11-08-19  Anesthesia review:  Hx of STEMI, CABG, HTN, MOD AS, AAA repair, CAD, OSA  Patient denies shortness of breath, fever, cough and chest pain at PAT appointment   Patient verbalized understanding of instructions that were given to them at the PAT appointment. Patient was also instructed that they will need to review over the PAT instructions again at home before surgery.

## 2019-11-15 NOTE — Progress Notes (Addendum)
Anesthesia Chart Review   Case: [id number] Date/Time: 11/16/19 1308   Procedure: LEFT CARPAL TUNNEL RELEASE (Left )   Anesthesia type: Choice   Pre-op diagnosis: left carpal tunnel syndrome   Location: [facility] ROOM 10 / [facility]   Surgeons: Mcarthur Rossetti, MD      DISCUSSION:84 y.o. former smoker (135 pack years, quit 10/05/83) with h/o GERD, sleep apnea, asthma, CHF, AAA s/p EVAR 08/2012, DM II, mild aortic stenosis (Peak velocity (S): 260cm/s. Mean gradient (S): 15 mm Hg. Peak gradient (S): 27 mm Hg. ), CAD (CABG in 1995 with several PCI's), renal insufficiency (creatinine stable), left carpal tunnel syndrome scheduled for above procedure 11/16/19 with Dr. Jean Rosenthal.   Pt last seen by cardiologist, Dr. Glenetta Hew, 05/24/2019.  Stable at this time.  Per OV note, "Recommend proceeding to the OR without any further evaluation.  Not using beta-blocker because of issue with bradycardia.  Okay to hold Plavix 5 to 7 days preop"  1 year follow up recommended.  He has been stable since this visit.  Reports last dose of Plavix 11/08/2019.   Anticipate pt can proceed with planned procedure barring acute status change.   VS: There were no vitals taken for this visit.  PROVIDERS: Denita Lung, MD is PCP   Glenetta Hew, MD is Cardiologist  LABS: labs pending (all labs ordered are listed, but only abnormal results are displayed)  Labs Reviewed - No data to display   IMAGES:   EKG: 05/24/2019 Rate 68 bpm Sinus rhythm with 1st degree AV block  Inferior infarct, age undetermined   CV: Echo 07/12/2018 Study Conclusions   - Left ventricle: The cavity size was mildly dilated. Systolic  function was normal. The estimated ejection fraction was in the  range of 50% to 55%. Mild hypokinesis of the inferolateral  myocardium. Features are consistent with a pseudonormal left  ventricular filling pattern, with concomitant abnormal relaxation  and increased filling pressure  (grade 2 diastolic dysfunction).  Doppler parameters are consistent with high ventricular filling  pressure.  - Aortic valve: Valve mobility was restricted. There was mild  stenosis. There was no regurgitation. Peak velocity (S): 260  cm/s. Mean gradient (S): 15 mm Hg. Peak gradient (S): 27 mm Hg.  - Aorta: Ascending aortic diameter: 39 mm (S).  - Ascending aorta: The ascending aorta was mildly dilated.  - Mitral valve: Transvalvular velocity was within the normal range.  There was no evidence for stenosis. There was mild regurgitation.  - Left atrium: The atrium was severely dilated.  - Right ventricle: The cavity size was normal. Wall thickness was  normal. Systolic function was normal.  - Atrial septum: No defect or patent foramen ovale was identified  by color flow Doppler.  - Tricuspid valve: There was mild regurgitation.  - Pulmonary arteries: Systolic pressure was mildly increased. PA  peak pressure: 41 mm Hg (S).  - Global longitudinal strain -19.3% (normal).   Cardiac Cath 04/20/2017  Prox RCA to Mid RCA lesion, 0 %stenosed.  LM lesion, 100 %stenosed.  Ost 3rd Mrg to 3rd Mrg lesion, 0 %stenosed.  LIMA and is normal in caliber and anatomically normal.  Ost LAD to Prox LAD lesion, 100 %stenosed.  There is mild left ventricular systolic dysfunction.  LV end diastolic pressure is normal.  The left ventricular ejection fraction is 45-50% by visual estimate.  Stress Test 09/22/2012 Overall Impression:  Low risk stress nuclear study demonstrating a moderate region on mid to basal inferior to inferolateral  scar without significant ischemia.  LV Wall Motion:  Mild apical hypokinesis with EF 47%. Past Medical History:  Diagnosis Date  . Adenomatous colon polyp   . Ankle edema    Chronic  . Asthma   . CAD S/P percutaneous coronary angioplasty 06/2003, 12/2005, 05/2014   a) '04: Staged Taxus DES PCI to RCA (2 Taxus 2.75 x 32 & 12) and Cx-OM (Taxus 3 x  20);;'07 - PCI to pRCA ISR- Cypher DES; c) 05/2014: pPCI pRCA stentISR (Promus DES 3 x 8) OM1 distal to stent (Promus DES 2.75 x 12 - 3.1)  . CAD, multiple vessel 1985   Most recent July 2018: Admitted for non-STEMI. Occluded LAD with patent LIMA (SP1 now occluded).  Patent stents in proximal and mid RCA as well as circumflex-OM 3. Known occlusion of SVG-OM. EF 45-50%  . CHF (congestive heart failure) ([place])   . Cholelithiasis - with cholangitis & choledocholithiasis    status post ERCP with removal of calculi and biliary stent placement.  . Chronic anemia    On iron supplement; history of positive guaiac - negative colonoscopy in 1996.; Thought to be related to hemorrhoids; status post hemorrhoidectomy  . Chronic back pain     multiple surgeries; C-spine and lumbar  . Diabetes mellitus    on oral medication  . Diverticulitis of colon 1996  . Diverticulosis   . Dyslipidemia, goal LDL below 70   . Erectile dysfunction   . Exertional dyspnea    Chronic baseline SOB with ambulation  . GERD (gastroesophageal reflux disease)   . Grade II diastolic dysfunction   . H/O: pneumonia February 14  . Hemorrhoids   . Hiatal hernia   . History of: ST elevation myocardial infarction (STEMI) involving left circumflex coronary artery with complication [id number]   PTCA-circumflex; PCI in 1991  . Hypertension   . Moderate aortic stenosis by prior echocardiogram 07/2015   Normal LV function - EF 55-60%.. Abnormal relaxation. Mild-moderate aortic stenosis (peak/mean gradient 18/10 mmH)  . Osteoarthritis of both knees    And back; multiple back surgeries, right knee arthroplasty and left knee arthroscopic surgery x2  . Pneumonia 2016  . S/P AAA (abdominal aortic aneurysm) repair 08/30/2012   s/p EVAR  . S/P CABG x 2 1997   LIMA-LAD, SVG-OM  . Sleep apnea    pt. states he was told to return for f/u, to be fitted for Cpap, but pt. reports that he didn't follow up-no cpap used    Past Surgical History:   Procedure Laterality Date  . Abdominal and Lower Extremity Arterial Ultrasound  08/23/2012; 10/10/2013   Normal ABIs. Nonocclusive lower extremity disease. 4.2 cm x 4.3 cm infrarenal AAA;; 4.4 cm x 4.3 cm (essentially stable)   . ABDOMINAL AORTIC ENDOVASCULAR STENT GRAFT N/A 08/13/2015   Procedure: ABDOMINAL AORTIC ENDOVASCULAR STENT GRAFT;  Surgeon: Mal Misty, MD;  Location: [place];  Service: Vascular;  Laterality: N/A;  . Anterior cervical plating  04/23/10   At C4-5 and a C6-7 utilizing two separate Biomet MaxAn plates.  . ANTERIOR LAT LUMBAR FUSION Left 11/22/2012   Procedure: ANTERIOR LATERAL LUMBAR FUSION 1 LEVEL;  Surgeon: Eustace Moore, MD;  Location: [place] [facility];  Service: Neurosurgery;  Laterality: Left;  Anterior Lateral Lumbar Fusion Lumbar Three-Four  . BACK SURGERY  1979 & x 10   pt. remarks, "I have had about 10 back surgeries"  . BILIARY STENT PLACEMENT N/A 07/03/2014   Procedure: BILIARY STENT PLACEMENT;  Surgeon: Sandy Salaam  Deatra Ina, MD;  Location: [place];  Service: Endoscopy;  Laterality: N/A;  . CARDIAC CATHETERIZATION  September 2004   None Occluded vein graft to OM; diffuse RCA disease in the mid vessel, 80% circumflex-OM stenosis; follow on AV groove circumflex with sequential 90% stenoses and intervening saccular dilation   . CARDIAC CATHETERIZATION  March 2010   4 abnormal Myoview showing apical thinning (possibly due to apical LAD 95%) : 100% Occluded LAD after SV1, distal LAD grafted via LIMA -apical 95% . Cx -OM1 w/patent stent extending into OM 1 . Follow on Cx - 70-80% - non-amenable PCI. RCA widely patent 3 overlapping stents in mRCA w/less than 40% stenosisin RPL; SVG-OM known occluded   . CATARACT EXTRACTION    . Cervical arthrodesis  04/23/10   Anterior cervical arthrodesis, C4-5, C6-7 utilizing 7-mm PEEK interbody cage packed with local autograft & Antifuse putty at C4-5 & an 8-mm cage at C6-7.  Marland Kitchen CERVICAL DISCECTOMY  04/23/10   Decompressive anterior  carvical diskectomy. C4-5, C6-7  . CHOLECYSTECTOMY N/A 05/23/2015   Procedure: LAPAROSCOPIC CHOLECYSTECTOMY WITH INTRAOPERATIVE CHOLANGIOGRAM;  Surgeon: Alphonsa Overall, MD;  [facility];  Service: General;  Laterality: N/A;  . [place]  . CORONARY ANGIOPLASTY  1985,1991,15   1985 lateral STEMI Circumflex PTCA;   . CORONARY ARTERY BYPASS GRAFT  1997   LIMA-LAD, SVG-OM (SVG known to be occluded prior to 2004)  . ERCP N/A 07/03/2014   Procedure: ENDOSCOPIC RETROGRADE CHOLANGIOPANCREATOGRAPHY (ERCP);  Surgeon: Inda Castle, MD;  Location: [place];  Service: Endoscopy;  Laterality: N/A;  . ERCP N/A 07/05/2014   Procedure: ENDOSCOPIC RETROGRADE CHOLANGIOPANCREATOGRAPHY (ERCP);  Surgeon: Inda Castle, MD;  Location: [place];  Service: Endoscopy;  Laterality: N/A;  . ERCP N/A 06/30/2015   Procedure: ENDOSCOPIC RETROGRADE CHOLANGIOPANCREATOGRAPHY (ERCP);  Surgeon: Ladene Artist, MD;  Location: [facility];  Service: Endoscopy;  Laterality: N/A;  . EYE SURGERY    . IR ANGIOGRAM EXTREMITY LEFT  01/18/2017  . IR ANGIOGRAM PELVIS SELECTIVE OR SUPRASELECTIVE  01/18/2017  . IR ANGIOGRAM SELECTIVE EACH ADDITIONAL VESSEL  01/18/2017  . IR AORTAGRAM ABDOMINAL SERIALOGRAM  01/18/2017  . IR EMBO ARTERIAL NOT HEMORR HEMANG INC GUIDE ROADMAPPING  01/18/2017  . IR RADIOLOGIST EVAL & MGMT  01/04/2017  . IR RADIOLOGIST EVAL & MGMT  10/26/2017  . IR RADIOLOGIST EVAL & MGMT  05/24/2018  . IR RADIOLOGIST EVAL & MGMT  06/19/2019  . IR US GUIDE VASC ACCESS RIGHT  01/18/2017  . KNEE ARTHROSCOPY Left    x 2  . LEFT HEART CATH AND CORS/GRAFTS ANGIOGRAPHY N/A 04/20/2017   Procedure: Left Heart Cath and Cors/Grafts Angiography;  Surgeon: Lorretta Harp, MD;  Location[facility] INVASIVE CV LAB;  LAD now occluded prior to SP1.LIMA-LAD. Patent RCA and circumflex stents. EF 45-50%. Relatively stable.   Marland Kitchen LEFT HEART CATHETERIZATION WITH CORONARY ANGIOGRAM N/A 05/15/2014   Procedure: LEFT HEART CATHETERIZATION WITH  CORONARY ANGIOGRAM;  Surgeon: Sinclair Grooms, MD;  Location: [facility] CATH LAB;  Service: Cardiovascular;  Laterality: N/A;  . LEFT HEART CATHETERIZATION WITH CORONARY/GRAFT ANGIOGRAM N/A 06/28/2014   Procedure: LEFT HEART CATHETERIZATION WITH Beatrix Fetters;  Surgeon: Troy Sine, MD;  Location: [facility] CATH LAB;  Service: Cardiovascular;  Laterality: N/A;  . LUMBAR PERCUTANEOUS PEDICLE SCREW 1 LEVEL N/A 11/22/2012   Procedure: LUMBAR PERCUTANEOUS PEDICLE SCREW 1 LEVEL;  Surgeon: Eustace Moore, MD;  Location: [place] [facility];  Service: Neurosurgery;  Laterality: N/A;  Lumbar Three-Four Percutaneous Pedicle Screw,  Lateral approach  . MINOR HEMORRHOIDECTOMY    . NM MYOVIEW LTD  December 2013   LOW RISK. Mmoderate region of mid to basal inferolateral scar without ischemia. Mild apical hypokinesis with an EF of 47%.  Marland Kitchen PERCUTANEOUS CORONARY STENT INTERVENTION (PCI-S)  September 2004   PCI - RCA 2 overlapping Taxus DES 2.75 mm x 32 mm and 2.75 mm x 12 mm (3.0 mm); PCI-Cx-OM1 - Taxus DES 3.0 mm x 20 mm (3.1 mm);   Marland Kitchen PERCUTANEOUS CORONARY STENT INTERVENTION (PCI-S)  12/2005   80% ISR in proximal Taxus stent in RCA -- covered proximally with Cypher DES 3.0 mm x 12 mm  . PERCUTANEOUS CORONARY STENT INTERVENTION (PCI-S) N/A 05/17/2014   Procedure: PERCUTANEOUS CORONARY STENT INTERVENTION (PCI-S);  Surgeon: Sinclair Grooms, MD;  Location: [facility] CATH LAB: PCI pRCA stent ISR - Promus DES 3.0 x 8 (3.25), OM distal stent edge - Promus DES 2.75 x 12 (3.1)  . PERIPHERAL VASCULAR CATHETERIZATION  11/03/2016   Procedure: Embolization;  Surgeon: Waynetta Sandy, MD;  Location: [place] CV LAB;  Service: Cardiovascular;;  . POSTERIOR CERVICAL FUSION/FORAMINOTOMY N/A 05/02/2013   Procedure: POSTERIOR CERVICAL FUSION/FORAMINOTOMY CERVICAL SEVEN THORACIC-ONE;  Surgeon: Eustace Moore, MD;  Location: [place] [facility];  Service: Neurosurgery;  Laterality: N/A;  POSTERIOR CERVICAL FUSION/FORAMINOTOMY CERVICAL SEVEN  THORACIC-ONE  . SPHINCTEROTOMY N/A 06/30/2015   Procedure: SPHINCTEROTOMY;  Surgeon: Ladene Artist, MD;  [facility];  Service: Endoscopy;  Laterality: N/A;  . TOTAL KNEE ARTHROPLASTY Right   . TOTAL KNEE ARTHROPLASTY Left 12/20/2016   Procedure: LEFT TOTAL KNEE ARTHROPLASTY;  Surgeon: Vickey Huger, MD;  Location: [place];  Service: Orthopedics;  Laterality: Left;  . TRANSTHORACIC ECHOCARDIOGRAM  07/2016   EF 45-50%. Inferolateral hypokinesis (correlates with scar noted on Myoview). GR 1 DD. Mild aortic stenosis. Mod LAE.  Marland Kitchen TRANSTHORACIC ECHOCARDIOGRAM  07/2018   Normal LV size.  EF 50 to 55% with mild HK of inferolateral wall.  GRII DD.  Mild AS with mean gradient 15 mm or greater.  Mild ascending aortic dilation.  Mildly increased PA pressures of 41 mmHg  . UPPER GI ENDOSCOPY  07/03/2019  . VISCERAL ANGIOGRAM  11/03/2016   Procedure: Visceral Angiogram;  Surgeon: Waynetta Sandy, MD;  Location: [place] CV LAB;  Service: Cardiovascular;;    MEDICATIONS: . acetaminophen (TYLENOL) 500 MG tablet  . albuterol (PROVENTIL HFA;VENTOLIN HFA) 108 (90 Base) MCG/ACT inhaler  . amLODipine (NORVASC) 5 MG tablet  . aspirin EC 81 MG EC tablet  . Choline Fenofibrate (FENOFIBRIC ACID) 135 MG CPDR  . clopidogrel (PLAVIX) 75 MG tablet  . ezetimibe (ZETIA) 10 MG tablet  . glucose blood (ONETOUCH VERIO) test strip  . Iron-Folic Acid-Vit 123456 (IRON FORMULA PO)  . losartan (COZAAR) 100 MG tablet  . metFORMIN (GLUCOPHAGE) 500 MG tablet  . Multiple Vitamins-Minerals (MULTIVITAMIN WITH MINERALS) tablet  . pantoprazole (PROTONIX) 40 MG tablet  . ranolazine (RANEXA) 500 MG 12 hr tablet  . sildenafil (REVATIO) 20 MG tablet   No current facility-administered medications for this encounter.    [facility] Pre-Surgical Testing [phone] 11/15/19  10:34 AM

## 2019-11-16 ENCOUNTER — Ambulatory Visit (HOSPITAL_COMMUNITY): Payer: Medicare Other | Admitting: Anesthesiology

## 2019-11-16 ENCOUNTER — Ambulatory Visit (HOSPITAL_COMMUNITY)
Admission: RE | Admit: 2019-11-16 | Discharge: 2019-11-16 | Disposition: A | Discharge status: Home / Self Care | Payer: Medicare Other | Attending: Orthopaedic Surgery | Admitting: Orthopaedic Surgery

## 2019-11-16 ENCOUNTER — Ambulatory Visit (HOSPITAL_COMMUNITY): Payer: Medicare Other | Admitting: Physician Assistant

## 2019-11-16 ENCOUNTER — Encounter (HOSPITAL_COMMUNITY): Payer: Self-pay | Admitting: Orthopaedic Surgery

## 2019-11-16 ENCOUNTER — Encounter (HOSPITAL_COMMUNITY)
Admission: RE | Disposition: A | Discharge status: Home / Self Care | Payer: Self-pay | Source: Home / Self Care | Attending: Orthopaedic Surgery

## 2019-11-16 DIAGNOSIS — Z955 Presence of coronary angioplasty implant and graft: Secondary | ICD-10-CM | POA: Diagnosis not present

## 2019-11-16 DIAGNOSIS — N183 Chronic kidney disease, stage 3 unspecified: Secondary | ICD-10-CM | POA: Diagnosis not present

## 2019-11-16 DIAGNOSIS — Z7982 Long term (current) use of aspirin: Secondary | ICD-10-CM | POA: Insufficient documentation

## 2019-11-16 DIAGNOSIS — I509 Heart failure, unspecified: Secondary | ICD-10-CM | POA: Insufficient documentation

## 2019-11-16 DIAGNOSIS — I2510 Atherosclerotic heart disease of native coronary artery without angina pectoris: Secondary | ICD-10-CM | POA: Diagnosis not present

## 2019-11-16 DIAGNOSIS — J45909 Unspecified asthma, uncomplicated: Secondary | ICD-10-CM | POA: Diagnosis not present

## 2019-11-16 DIAGNOSIS — Z79899 Other long term (current) drug therapy: Secondary | ICD-10-CM | POA: Diagnosis not present

## 2019-11-16 DIAGNOSIS — I110 Hypertensive heart disease with heart failure: Secondary | ICD-10-CM | POA: Insufficient documentation

## 2019-11-16 DIAGNOSIS — Z7902 Long term (current) use of antithrombotics/antiplatelets: Secondary | ICD-10-CM | POA: Insufficient documentation

## 2019-11-16 DIAGNOSIS — G5602 Carpal tunnel syndrome, left upper limb: Secondary | ICD-10-CM | POA: Insufficient documentation

## 2019-11-16 DIAGNOSIS — Z96653 Presence of artificial knee joint, bilateral: Secondary | ICD-10-CM | POA: Diagnosis not present

## 2019-11-16 DIAGNOSIS — Z87891 Personal history of nicotine dependence: Secondary | ICD-10-CM | POA: Insufficient documentation

## 2019-11-16 DIAGNOSIS — I5032 Chronic diastolic (congestive) heart failure: Secondary | ICD-10-CM | POA: Diagnosis not present

## 2019-11-16 DIAGNOSIS — I252 Old myocardial infarction: Secondary | ICD-10-CM | POA: Insufficient documentation

## 2019-11-16 DIAGNOSIS — G4730 Sleep apnea, unspecified: Secondary | ICD-10-CM | POA: Insufficient documentation

## 2019-11-16 DIAGNOSIS — E785 Hyperlipidemia, unspecified: Secondary | ICD-10-CM | POA: Diagnosis not present

## 2019-11-16 DIAGNOSIS — E1151 Type 2 diabetes mellitus with diabetic peripheral angiopathy without gangrene: Secondary | ICD-10-CM | POA: Insufficient documentation

## 2019-11-16 DIAGNOSIS — I130 Hypertensive heart and chronic kidney disease with heart failure and stage 1 through stage 4 chronic kidney disease, or unspecified chronic kidney disease: Secondary | ICD-10-CM | POA: Diagnosis not present

## 2019-11-16 DIAGNOSIS — Z951 Presence of aortocoronary bypass graft: Secondary | ICD-10-CM | POA: Diagnosis not present

## 2019-11-16 DIAGNOSIS — Z7984 Long term (current) use of oral hypoglycemic drugs: Secondary | ICD-10-CM | POA: Diagnosis not present

## 2019-11-16 DIAGNOSIS — K219 Gastro-esophageal reflux disease without esophagitis: Secondary | ICD-10-CM | POA: Diagnosis not present

## 2019-11-16 HISTORY — PX: CARPAL TUNNEL RELEASE: SHX101

## 2019-11-16 LAB — GLUCOSE, CAPILLARY
Glucose-Capillary: 112 mg/dL — ABNORMAL HIGH (ref 70–99)
Glucose-Capillary: 100 mg/dL — ABNORMAL HIGH (ref 70–99)

## 2019-11-16 SURGERY — CARPAL TUNNEL RELEASE
Anesthesia: Monitor Anesthesia Care | Laterality: Left

## 2019-11-16 MED ORDER — LACTATED RINGERS IV SOLN: INTRAVENOUS | Status: DC

## 2019-11-16 MED ORDER — BUPIVACAINE HCL (PF) 0.25 % IJ SOLN
INTRAMUSCULAR | Status: DC | PRN
  Administered 2019-11-16: 7 mL

## 2019-11-16 MED ORDER — FENTANYL CITRATE (PF) 100 MCG/2ML IJ SOLN: 25.0000 ug | INTRAMUSCULAR | Status: DC | PRN

## 2019-11-16 MED ORDER — CHLORHEXIDINE GLUCONATE 4 % EX LIQD: 60.0000 mL | Freq: Once | CUTANEOUS | Status: DC

## 2019-11-16 MED ORDER — PROMETHAZINE HCL 25 MG/ML IJ SOLN: 6.2500 mg | INTRAMUSCULAR | Status: DC | PRN

## 2019-11-16 MED ORDER — CEFAZOLIN SODIUM-DEXTROSE 2-4 GM/100ML-% IV SOLN
2.0000 g | INTRAVENOUS | Status: DC
  Filled 2019-11-16: qty 100

## 2019-11-16 MED ORDER — HYDROCODONE-ACETAMINOPHEN 5-325 MG PO TABS: 1.0000 | ORAL_TABLET | Freq: Four times a day (QID) | ORAL | 0 refills | Status: DC | PRN

## 2019-11-16 MED ORDER — PROPOFOL 500 MG/50ML IV EMUL
INTRAVENOUS | Status: DC | PRN
  Administered 2019-11-16: 200 ug/kg/min via INTRAVENOUS

## 2019-11-16 MED ORDER — BUPIVACAINE HCL 0.25 % IJ SOLN
INTRAMUSCULAR | Status: AC
  Filled 2019-11-16: qty 1

## 2019-11-16 MED ORDER — ACETAMINOPHEN 500 MG PO TABS
1000.0000 mg | ORAL_TABLET | Freq: Once | ORAL | Status: AC
  Administered 2019-11-16: 1000 mg via ORAL
  Filled 2019-11-16: qty 2

## 2019-11-16 MED ORDER — LIDOCAINE HCL (PF) 1 % IJ SOLN
INTRAMUSCULAR | Status: AC
  Filled 2019-11-16: qty 30

## 2019-11-16 MED ORDER — LIDOCAINE HCL (PF) 1 % IJ SOLN
INTRAMUSCULAR | Status: DC | PRN
  Administered 2019-11-16: 10 mL

## 2019-11-16 MED ORDER — 0.9 % SODIUM CHLORIDE (POUR BTL) OPTIME
TOPICAL | Status: DC | PRN
  Administered 2019-11-16: 1000 mL

## 2019-11-16 MED ORDER — FENTANYL CITRATE (PF) 100 MCG/2ML IJ SOLN
INTRAMUSCULAR | Status: AC
  Filled 2019-11-16: qty 2

## 2019-11-16 MED ORDER — LACTATED RINGERS IV SOLN: INTRAVENOUS | Status: DC | PRN

## 2019-11-16 MED ORDER — FENTANYL CITRATE (PF) 100 MCG/2ML IJ SOLN
INTRAMUSCULAR | Status: DC | PRN
  Administered 2019-11-16: 50 ug via INTRAVENOUS

## 2019-11-16 SURGICAL SUPPLY — 36 items
BNDG CMPR 9X4 STRL LF SNTH (GAUZE/BANDAGES/DRESSINGS) ×1
BNDG COHESIVE 4X5 TAN STRL (GAUZE/BANDAGES/DRESSINGS) ×3 IMPLANT
BNDG ELASTIC 3X5.8 VLCR STR LF (GAUZE/BANDAGES/DRESSINGS) ×3 IMPLANT
BNDG ELASTIC 4X5.8 VLCR STR LF (GAUZE/BANDAGES/DRESSINGS) ×2 IMPLANT
BNDG ESMARK 4X9 LF (GAUZE/BANDAGES/DRESSINGS) ×3 IMPLANT
CORD BIPOLAR FORCEPS 12FT (ELECTRODE) ×3 IMPLANT
COVER MAYO STAND STRL (DRAPES) ×3 IMPLANT
COVER SURGICAL LIGHT HANDLE (MISCELLANEOUS) ×3 IMPLANT
COVER WAND RF STERILE (DRAPES) IMPLANT
CUFF TOURN SGL QUICK 18X4 (TOURNIQUET CUFF) IMPLANT
CUFF TOURN SGL QUICK 24 (TOURNIQUET CUFF)
CUFF TRNQT CYL 24X4X16.5-23 (TOURNIQUET CUFF) IMPLANT
DRAPE SURG 17X11 SM STRL (DRAPES) ×3 IMPLANT
DURAPREP 26ML APPLICATOR (WOUND CARE) ×3 IMPLANT
GAUZE SPONGE 4X4 12PLY STRL (GAUZE/BANDAGES/DRESSINGS) ×3 IMPLANT
GAUZE XEROFORM 1X8 LF (GAUZE/BANDAGES/DRESSINGS) ×3 IMPLANT
GLOVE BIO SURGEON STRL SZ7.5 (GLOVE) ×3 IMPLANT
GLOVE BIOGEL PI IND STRL 8 (GLOVE) ×1 IMPLANT
GLOVE BIOGEL PI INDICATOR 8 (GLOVE) ×2
GLOVE ECLIPSE 8.0 STRL XLNG CF (GLOVE) IMPLANT
GOWN STRL REUS W/TWL XL LVL3 (GOWN DISPOSABLE) ×3 IMPLANT
KIT BASIN OR (CUSTOM PROCEDURE TRAY) ×3 IMPLANT
KIT TURNOVER KIT A (KITS) IMPLANT
NEEDLE HYPO 22GX1.5 SAFETY (NEEDLE) ×3 IMPLANT
NS IRRIG 1000ML POUR BTL (IV SOLUTION) ×3 IMPLANT
PACK ORTHO EXTREMITY (CUSTOM PROCEDURE TRAY) ×3 IMPLANT
PAD CAST 3X4 CTTN HI CHSV (CAST SUPPLIES) ×1 IMPLANT
PADDING CAST COTTON 3X4 STRL (CAST SUPPLIES) ×3
PENCIL SMOKE EVACUATOR (MISCELLANEOUS) IMPLANT
PROTECTOR NERVE ULNAR (MISCELLANEOUS) ×3 IMPLANT
SUCTION FRAZIER HANDLE 10FR (MISCELLANEOUS)
SUCTION TUBE FRAZIER 10FR DISP (MISCELLANEOUS) IMPLANT
SUT ETHILON 3 0 PS 1 (SUTURE) ×3 IMPLANT
SYR CONTROL 10ML LL (SYRINGE) ×3 IMPLANT
TOWEL OR 17X26 10 PK STRL BLUE (TOWEL DISPOSABLE) ×3 IMPLANT
YANKAUER SUCT BULB TIP 10FT TU (MISCELLANEOUS) ×3 IMPLANT

## 2019-11-16 NOTE — Transfer of Care (Signed)
Immediate Anesthesia Transfer of Care Note  Patient: VRAJ DENARDO  Procedure(s) Performed: LEFT CARPAL TUNNEL RELEASE (Left )  Patient Location: PACU  Anesthesia Type:MAC  Level of Consciousness: awake, alert , oriented and patient cooperative  Airway & Oxygen Therapy: Patient Spontanous Breathing and Patient connected to face mask oxygen  Post-op Assessment: Report given to RN, Post -op Vital signs reviewed and stable and Patient moving all extremities X 4  Post vital signs: stable  Last Vitals:  Vitals Value Taken Time  BP 140/88 11/16/19 1325  Temp    Pulse 54 11/16/19 1329  Resp 9 11/16/19 1329  SpO2 100 % 11/16/19 1329  Vitals shown include unvalidated device data.  Last Pain:  Vitals:   11/16/19 1325  TempSrc:   PainSc: (P) 0-No pain         Complications: No apparent anesthesia complications

## 2019-11-16 NOTE — Anesthesia Preprocedure Evaluation (Addendum)
Anesthesia Evaluation  Patient identified by MRN, date of birth, ID band Patient awake    Reviewed: Allergy & Precautions, NPO status , Patient's Chart, lab work & pertinent test results  History of Anesthesia Complications Negative for: history of anesthetic complications  Airway Mallampati: II  TM Distance: >3 FB Neck ROM: Full    Dental  (+) Missing,    Pulmonary asthma , sleep apnea , former smoker,    Pulmonary exam normal        Cardiovascular hypertension, Pt. on medications + CAD (on Plavix), + Past MI (1985), + Cardiac Stents, + CABG (1997), + Peripheral Vascular Disease and +CHF  Normal cardiovascular exam  Hx of AAA s/p EVAR  Echo 07/12/2018: EF 50-55%, mild hypokinesis of the inferolateralmyocardium, grade 2 diastolic dysfunction, mild AS, mild MR, severe LAE, mild TR, PASP 41 mm Hg    Neuro/Psych negative neurological ROS  negative psych ROS   GI/Hepatic Neg liver ROS, hiatal hernia, GERD  Medicated and Controlled,  Endo/Other  diabetes, Type 2  Renal/GU Renal InsufficiencyRenal disease  negative genitourinary   Musculoskeletal  (+) Arthritis ,   Abdominal   Peds  Hematology negative hematology ROS (+)   Anesthesia Other Findings Day of surgery medications reviewed with patient.  Reproductive/Obstetrics negative OB ROS                            Anesthesia Physical Anesthesia Plan  ASA: III  Anesthesia Plan: MAC   Post-op Pain Management:    Induction:   PONV Risk Score and Plan: 1 and Treatment may vary due to age or medical condition, Propofol infusion and Ondansetron  Airway Management Planned: Natural Airway and Simple Face Mask  Additional Equipment: None  Intra-op Plan:   Post-operative Plan:   Informed Consent: I have reviewed the patients History and Physical, chart, labs and discussed the procedure including the risks, benefits and alternatives for  the proposed anesthesia with the patient or authorized representative who has indicated his/her understanding and acceptance.       Plan Discussed with: CRNA  Anesthesia Plan Comments:        Anesthesia Quick Evaluation

## 2019-11-16 NOTE — Brief Op Note (Signed)
11/16/2019  1:12 PM  PATIENT:  [name]  84 y.o. male  PRE-OPERATIVE DIAGNOSIS:  left carpal tunnel syndrome  POST-OPERATIVE DIAGNOSIS:  * No post-op diagnosis entered *  PROCEDURE:  Procedure(s): LEFT CARPAL TUNNEL RELEASE (Left)  SURGEON:  Surgeon(s) and Role:    Mcarthur Rossetti, MD - Primary  PHYSICIAN ASSISTANT:  Benita Stabile, PA-C  ANESTHESIA:   local and IV sedation  COUNTS:  YES  TOURNIQUET:  * Missing tourniquet times found for documented tourniquets in log: [id number] *  DICTATION: .Other Dictation: Dictation Number [id number]  PLAN OF CARE: Discharge to home after PACU  PATIENT DISPOSITION:  PACU - hemodynamically stable.   Delay start of Pharmacological VTE agent (>24hrs) due to surgical blood loss or risk of bleeding: no

## 2019-11-16 NOTE — H&P (Signed)
[name] is an 84 y.o. male.   Chief Complaint: Left hand numbness and weakness with known carpal tunnel syndrome HPI: The patient is a 84 year old gentleman with well-documented severe carpal tunnel syndrome involving his upper extremities.  He is presenting today as an outpatient for a left open carpal tunnel release given the numbness and tingling and weakness in his hand.  His nerve conduction studies did confirm severe carpal tunnel syndrome and this correlates with his clinical exam.  Past Medical History:  Diagnosis Date  . Adenomatous colon polyp   . Ankle edema    Chronic  . Asthma   . CAD S/P percutaneous coronary angioplasty 06/2003, 12/2005, 05/2014   a) '04: Staged Taxus DES PCI to RCA (2 Taxus 2.75 x 32 & 12) and Cx-OM (Taxus 3 x 20);;'07 - PCI to pRCA ISR- Cypher DES; c) 05/2014: pPCI pRCA stentISR (Promus DES 3 x 8) OM1 distal to stent (Promus DES 2.75 x 12 - 3.1)  . CAD, multiple vessel 1985   Most recent July 2018: Admitted for non-STEMI. Occluded LAD with patent LIMA (SP1 now occluded).  Patent stents in proximal and mid RCA as well as circumflex-OM 3. Known occlusion of SVG-OM. EF 45-50%  . CHF (congestive heart failure) ([place])   . Cholelithiasis - with cholangitis & choledocholithiasis    status post ERCP with removal of calculi and biliary stent placement.  . Chronic anemia    On iron supplement; history of positive guaiac - negative colonoscopy in 1996.; Thought to be related to hemorrhoids; status post hemorrhoidectomy  . Chronic back pain     multiple surgeries; C-spine and lumbar  . Diabetes mellitus    on oral medication  . Diverticulitis of colon 1996  . Diverticulosis   . Dyslipidemia, goal LDL below 70   . Erectile dysfunction   . Exertional dyspnea    Chronic baseline SOB with ambulation  . GERD (gastroesophageal reflux disease)   . Grade II diastolic dysfunction   . H/O: pneumonia February 14  . Hemorrhoids   . Hiatal hernia   . History of: ST  elevation myocardial infarction (STEMI) involving left circumflex coronary artery with complication [id number]   PTCA-circumflex; PCI in 1991  . Hypertension   . Moderate aortic stenosis by prior echocardiogram 07/2015   Normal LV function - EF 55-60%.. Abnormal relaxation. Mild-moderate aortic stenosis (peak/mean gradient 18/10 mmH)  . Osteoarthritis of both knees    And back; multiple back surgeries, right knee arthroplasty and left knee arthroscopic surgery x2  . Pneumonia 2016  . S/P AAA (abdominal aortic aneurysm) repair 08/30/2012   s/p EVAR  . S/P CABG x 2 1997   LIMA-LAD, SVG-OM  . Sleep apnea    pt. states he was told to return for f/u, to be fitted for Cpap, but pt. reports that he didn't follow up-no cpap used    Past Surgical History:  Procedure Laterality Date  . Abdominal and Lower Extremity Arterial Ultrasound  08/23/2012; 10/10/2013   Normal ABIs. Nonocclusive lower extremity disease. 4.2 cm x 4.3 cm infrarenal AAA;; 4.4 cm x 4.3 cm (essentially stable)   . ABDOMINAL AORTIC ENDOVASCULAR STENT GRAFT N/A 08/13/2015   Procedure: ABDOMINAL AORTIC ENDOVASCULAR STENT GRAFT;  Surgeon: Mal Misty, MD;  Location: [facility];  Service: Vascular;  Laterality: N/A;  . Anterior cervical plating  04/23/10   At C4-5 and a C6-7 utilizing two separate Biomet MaxAn plates.  . ANTERIOR LAT LUMBAR FUSION Left 11/22/2012   Procedure:  ANTERIOR LATERAL LUMBAR FUSION 1 LEVEL;  Surgeon: Eustace Moore, MD;  Location: [facility];  Service: Neurosurgery;  Laterality: Left;  Anterior Lateral Lumbar Fusion Lumbar Three-Four  . BACK SURGERY  1979 & x 10   pt. remarks, "I have had about 10 back surgeries"  . BILIARY STENT PLACEMENT N/A 07/03/2014   Procedure: BILIARY STENT PLACEMENT;  Surgeon: Inda Castle, MD;  Location: [facility];  Service: Endoscopy;  Laterality: N/A;  . CARDIAC CATHETERIZATION  September 2004   None Occluded vein graft to OM; diffuse RCA disease in the mid vessel, 80% circumflex-OM  stenosis; follow on AV groove circumflex with sequential 90% stenoses and intervening saccular dilation   . CARDIAC CATHETERIZATION  March 2010   4 abnormal Myoview showing apical thinning (possibly due to apical LAD 95%) : 100% Occluded LAD after SV1, distal LAD grafted via LIMA -apical 95% . Cx -OM1 w/patent stent extending into OM 1 . Follow on Cx - 70-80% - non-amenable PCI. RCA widely patent 3 overlapping stents in mRCA w/less than 40% stenosisin RPL; SVG-OM known occluded   . CATARACT EXTRACTION    . Cervical arthrodesis  04/23/10   Anterior cervical arthrodesis, C4-5, C6-7 utilizing 7-mm PEEK interbody cage packed with local autograft & Antifuse putty at C4-5 & an 8-mm cage at C6-7.  Marland Kitchen CERVICAL DISCECTOMY  04/23/10   Decompressive anterior carvical diskectomy. C4-5, C6-7  . CHOLECYSTECTOMY N/A 05/23/2015   Procedure: LAPAROSCOPIC CHOLECYSTECTOMY WITH INTRAOPERATIVE CHOLANGIOGRAM;  Surgeon: Alphonsa Overall, MD;  [facility];  Service: General;  Laterality: N/A;  . [place]  . CORONARY ANGIOPLASTY  1985,1991,15   1985 lateral STEMI Circumflex PTCA;   . CORONARY ARTERY BYPASS GRAFT  1997   LIMA-LAD, SVG-OM (SVG known to be occluded prior to 2004)  . ERCP N/A 07/03/2014   Procedure: ENDOSCOPIC RETROGRADE CHOLANGIOPANCREATOGRAPHY (ERCP);  Surgeon: Inda Castle, MD;  Location: [facility];  Service: Endoscopy;  Laterality: N/A;  . ERCP N/A 07/05/2014   Procedure: ENDOSCOPIC RETROGRADE CHOLANGIOPANCREATOGRAPHY (ERCP);  Surgeon: Inda Castle, MD;  Location: [facility];  Service: Endoscopy;  Laterality: N/A;  . ERCP N/A 06/30/2015   Procedure: ENDOSCOPIC RETROGRADE CHOLANGIOPANCREATOGRAPHY (ERCP);  Surgeon: Ladene Artist, MD;  Location: [facility];  Service: Endoscopy;  Laterality: N/A;  . EYE SURGERY    . IR ANGIOGRAM EXTREMITY LEFT  01/18/2017  . IR ANGIOGRAM PELVIS SELECTIVE OR SUPRASELECTIVE  01/18/2017  . IR ANGIOGRAM SELECTIVE EACH ADDITIONAL VESSEL  01/18/2017  . IR  AORTAGRAM ABDOMINAL SERIALOGRAM  01/18/2017  . IR EMBO ARTERIAL NOT HEMORR HEMANG INC GUIDE ROADMAPPING  01/18/2017  . IR RADIOLOGIST EVAL & MGMT  01/04/2017  . IR RADIOLOGIST EVAL & MGMT  10/26/2017  . IR RADIOLOGIST EVAL & MGMT  05/24/2018  . IR RADIOLOGIST EVAL & MGMT  06/19/2019  . IR US GUIDE VASC ACCESS RIGHT  01/18/2017  . KNEE ARTHROSCOPY Left    x 2  . LEFT HEART CATH AND CORS/GRAFTS ANGIOGRAPHY N/A 04/20/2017   Procedure: Left Heart Cath and Cors/Grafts Angiography;  Surgeon: Lorretta Harp, MD;  Location: [facility] CV [facility];  LAD now occluded prior to SP1.LIMA-LAD. Patent RCA and circumflex stents. EF 45-50%. Relatively stable.   Marland Kitchen LEFT HEART CATHETERIZATION WITH CORONARY ANGIOGRAM N/A 05/15/2014   Procedure: LEFT HEART CATHETERIZATION WITH CORONARY ANGIOGRAM;  Surgeon: Sinclair Grooms, MD;  Location: [facility] C[facility];  Service: Cardiovascular;  Laterality: N/A;  . LEFT HEART CATHETERIZATION WITH CORONARY/GRAFT ANGIOGRAM N/A 06/28/2014  Procedure: LEFT HEART CATHETERIZATION WITH Beatrix Fetters;  Surgeon: Troy Sine, MD;  Location: [facility] CATH LAB;  Service: Cardiovascular;  Laterality: N/A;  . LUMBAR PERCUTANEOUS PEDICLE SCREW 1 LEVEL N/A 11/22/2012   Procedure: LUMBAR PERCUTANEOUS PEDICLE SCREW 1 LEVEL;  Surgeon: Eustace Moore, MD;  Location: [place] [facility];  Service: Neurosurgery;  Laterality: N/A;  Lumbar Three-Four Percutaneous Pedicle Screw, Lateral approach  . MINOR HEMORRHOIDECTOMY    . NM MYOVIEW LTD  December 2013   LOW RISK. Mmoderate region of mid to basal inferolateral scar without ischemia. Mild apical hypokinesis with an EF of 47%.  Marland Kitchen PERCUTANEOUS CORONARY STENT INTERVENTION (PCI-S)  September 2004   PCI - RCA 2 overlapping Taxus DES 2.75 mm x 32 mm and 2.75 mm x 12 mm (3.0 mm); PCI-Cx-OM1 - Taxus DES 3.0 mm x 20 mm (3.1 mm);   Marland Kitchen PERCUTANEOUS CORONARY STENT INTERVENTION (PCI-S)  12/2005   80% ISR in proximal Taxus stent in RCA -- covered proximally with Cypher DES 3.0 mm  x 12 mm  . PERCUTANEOUS CORONARY STENT INTERVENTION (PCI-S) N/A 05/17/2014   Procedure: PERCUTANEOUS CORONARY STENT INTERVENTION (PCI-S);  Surgeon: Sinclair Grooms, MD;  Location: [facility] CATH LAB: PCI pRCA stent ISR - Promus DES 3.0 x 8 (3.25), OM distal stent edge - Promus DES 2.75 x 12 (3.1)  . PERIPHERAL VASCULAR CATHETERIZATION  11/03/2016   Procedure: Embolization;  Surgeon: Waynetta Sandy, MD;  Location: [facility] CV LAB;  Service: Cardiovascular;;  . POSTERIOR CERVICAL FUSION/FORAMINOTOMY N/A 05/02/2013   Procedure: POSTERIOR CERVICAL FUSION/FORAMINOTOMY CERVICAL SEVEN THORACIC-ONE;  Surgeon: Eustace Moore, MD;  Location: [place] [facility];  Service: Neurosurgery;  Laterality: N/A;  POSTERIOR CERVICAL FUSION/FORAMINOTOMY CERVICAL SEVEN THORACIC-ONE  . SPHINCTEROTOMY N/A 06/30/2015   Procedure: SPHINCTEROTOMY;  Surgeon: Ladene Artist, MD;  [facility];  Service: Endoscopy;  Laterality: N/A;  . TOTAL KNEE ARTHROPLASTY Right   . TOTAL KNEE ARTHROPLASTY Left 12/20/2016   Procedure: LEFT TOTAL KNEE ARTHROPLASTY;  Surgeon: Vickey Huger, MD;  Location: [facility];  Service: Orthopedics;  Laterality: Left;  . TRANSTHORACIC ECHOCARDIOGRAM  07/2016   EF 45-50%. Inferolateral hypokinesis (correlates with scar noted on Myoview). GR 1 DD. Mild aortic stenosis. Mod LAE.  Marland Kitchen TRANSTHORACIC ECHOCARDIOGRAM  07/2018   Normal LV size.  EF 50 to 55% with mild HK of inferolateral wall.  GRII DD.  Mild AS with mean gradient 15 mm or greater.  Mild ascending aortic dilation.  Mildly increased PA pressures of 41 mmHg  . UPPER GI ENDOSCOPY  07/03/2019  . VISCERAL ANGIOGRAM  11/03/2016   Procedure: Visceral Angiogram;  Surgeon: Waynetta Sandy, MD;  Location: [facility] CV LAB;  Service: Cardiovascular;;    Family History  Problem Relation Age of Onset  . Arthritis Mother   . Diabetes Father   . Heart disease Father   . Stroke Sister   . Hypertension Sister   . Heart disease Sister   .  Diabetes Sister   . Breast cancer Sister   . Heart disease Brother   . Ulcers Brother   . Colon cancer Neg Hx    Social History:  reports that he quit smoking about 36 years ago. His smoking use included cigarettes. He has a 135.00 pack-year smoking history. He has never used smokeless tobacco. He reports current alcohol use of about 4.0 standard drinks of alcohol per week. He reports that he does not use drugs.  Allergies:  Allergies  Allergen Reactions  .  Oxycodone Shortness Of Breath and Cough  . Lisinopril Cough  . Statins Other (See Comments)    MYALGIAS   . Welchol [Colesevelam Hcl] Itching    No medications prior to admission.    Results for orders placed or performed during the hospital encounter of 11/15/19 (from the past 48 hour(s))  Basic metabolic panel     Status: Abnormal   Collection Time: 11/15/19 12:42 PM  Result Value Ref Range   Sodium 138 135 - 145 mmol/L   Potassium 4.7 3.5 - 5.1 mmol/L   Chloride 109 98 - 111 mmol/L   CO2 23 22 - 32 mmol/L   Glucose, Bld 114 (H) 70 - 99 mg/dL   BUN 25 (H) 8 - 23 mg/dL   Creatinine, Ser 1.65 (H) 0.61 - 1.24 mg/dL   Calcium 9.0 8.9 - 10.3 mg/dL   GFR calc non Af Amer 38 (L) >60 mL/min   GFR calc Af Amer 44 (L) >60 mL/min   Anion gap 6 5 - 15    Comment: Performed at [facility], [place] [address], [place], [place] [address]  CBC     Status: Abnormal   Collection Time: 11/15/19 12:42 PM  Result Value Ref Range   WBC 5.2 4.0 - 10.5 K/uL   RBC 3.81 (L) 4.22 - 5.81 MIL/uL   Hemoglobin 12.0 (L) 13.0 - 17.0 g/dL   HCT 37.2 (L) 39.0 - 52.0 %   MCV 97.6 80.0 - 100.0 fL   MCH 31.5 26.0 - 34.0 pg   MCHC 32.3 30.0 - 36.0 g/dL   RDW 14.6 11.5 - 15.5 %   Platelets 195 150 - 400 K/uL   nRBC 0.0 0.0 - 0.2 %    Comment: Performed at [facility], [place] [address], [place], [place] [address]  Hemoglobin A1c     Status: Abnormal   Collection Time: 11/15/19 12:42 PM  Result Value Ref Range    Hgb A1c MFr Bld 5.8 (H) 4.8 - 5.6 %    Comment: (NOTE) Pre diabetes:          5.7%-6.4% Diabetes:              >6.4% Glycemic control for   <7.0% adults with diabetes    Mean Plasma Glucose 119.76 mg/dL    Comment: Performed at [facility] [address], [place], [place] [address]   No results found.  Review of Systems  All other systems reviewed and are negative.   There were no vitals taken for this visit. Physical Exam  Constitutional: He is oriented to person, place, and time. He appears well-developed.  HENT:  Head: Normocephalic.  Eyes: Pupils are equal, round, and reactive to light.  Cardiovascular: Normal rate.  Respiratory: Effort normal.  GI: Soft.  Musculoskeletal:     Left hand: Decreased strength. Decreased sensation of the median distribution.     Cervical back: Normal range of motion.  Neurological: He is alert and oriented to person, place, and time.  Skin: Skin is warm and dry.  Psychiatric: He has a normal mood and affect.     Assessment/Plan Left severe carpal tunnel syndrome  Our plan is to proceed to surgery today for a left open carpal tunnel release under just some light sedation and local anesthesia.  The risk and benefits of surgery been explained and discussed in detail.  This will be done as an outpatient.  Mcarthur Rossetti, MD 11/16/2019, 10:12 AM

## 2019-11-16 NOTE — Anesthesia Postprocedure Evaluation (Signed)
Anesthesia Post Note  Patient: [name]  Procedure(s) Performed: LEFT CARPAL TUNNEL RELEASE (Left )     Patient location during evaluation: PACU Anesthesia Type: MAC Level of consciousness: awake and alert Pain management: pain level controlled Vital Signs Assessment: post-procedure vital signs reviewed and stable Respiratory status: spontaneous breathing, nonlabored ventilation, respiratory function stable and patient connected to nasal cannula oxygen Cardiovascular status: stable and blood pressure returned to baseline Postop Assessment: no apparent nausea or vomiting Anesthetic complications: no    Last Vitals:  Vitals:   11/16/19 1325 11/16/19 1330  BP: 140/88 131/81  Pulse: (!) 56 (!) 55  Resp: (!) 21 (!) 7  Temp: (!) 36.3 C   SpO2: 100% 100%    Last Pain:  Vitals:   11/16/19 1325  TempSrc:   PainSc: 0-No pain                 Jadeyn Hargett DAVID

## 2019-11-16 NOTE — Anesthesia Procedure Notes (Signed)
Procedure Name: MAC Date/Time: 11/16/2019 12:47 PM Performed by: Lissa Morales, CRNA Pre-anesthesia Checklist: Patient identified, Emergency Drugs available, Suction available, Patient being monitored and Timeout performed Patient Re-evaluated:Patient Re-evaluated prior to induction Oxygen Delivery Method: Simple face mask Placement Confirmation: positive ETCO2

## 2019-11-16 NOTE — Discharge Instructions (Signed)
You may use your left hand as comfort allows. Do expect some hand swelling - ice and elevation as needed. Keep your current dressing clean and dry. You may remove your dressing in one week. In one week, you may get your incision wet daily in the shower. Place a large band-aid over your incision daily starting in one week.

## 2019-11-17 NOTE — Op Note (Signed)
NAME: [name], [name] MEDICAL RECORD [id number] ACCOUNT [id number] DATE OF BIRTH:12/23/1935 FACILITY: [facility] LOCATION: [facility] PHYSICIAN:Ceferino Lang Kerry Fort, MD  OPERATIVE REPORT  DATE OF PROCEDURE:  11/16/2019  PREOPERATIVE DIAGNOSIS:  Left severe carpal tunnel syndrome.  POSTOPERATIVE DIAGNOSIS:  Left severe carpal tunnel syndrome.  PROCEDURE:  Left open carpal tunnel release.  SURGEON:  Lind Guest.  Ninfa Linden, MD  ASSISTANT:  Erskine Emery, PA-C  ANESTHESIA: 1.  Mask ventilation, IV sedation. 2.  Local injection with 1% plain lidocaine, followed by 0.25% plain Marcaine.  ANTIBIOTICS:  Two g IV Ancef.  TOURNIQUET TIME:  Less than 15 minutes.  COMPLICATIONS:  None.  INDICATIONS:  The patient is an 84 year old gentleman with severe carpal tunnel syndrome involving both his upper extremities.  This has been verified with EMG and nerve conduction studies.  His clinical exam also shows  numbness and tingling and  weakness in the median nerve distribution and his grip and pinch strength are limited.  At this point, we have recommended carpal tunnel release.  He understands that he will likely not get a full recovery due to the severity of his disease, but this is  certainly worth trying to decrease the pain and numbness that he is having in his hand.  We did have a long and thorough discussion about the anatomy of the transverse carpal ligament.  I talked to him about the risks and benefits of surgery and  described what his intraoperative and postoperative course would involve.  DESCRIPTION OF PROCEDURE:  After informed consent was obtained and appropriate left hand and wrist were marked, he was brought to the operating room and placed supine on the operating table.  The left hand on arm table.  A nonsterile tourniquet was  placed on his upper left arm and his left hand and wrist were prepped and draped with DuraPrep and sterile drapes.  A time-out was called.  He was  identified, correct patient, correct left hand and wrist.  Light sedation was then given with mask  ventilation and IV sedation.  We then anesthetized the palm first with 1% plain lidocaine, then followed by 0.25% plain Marcaine.  We then used an Esmarch to wrap the upper arm and tourniquet inflated to 250 of pressure.  I then made an incision over the  transverse carpal ligament in the palm of the hand and carried this proximally and distally.  I dissected down to the distal edge of the transverse carpal ligament and then slowly and meticulously divided this from distal to proximal.  We did visualize  the median nerve and explored it and found the motor branch to be intact.  We then irrigated the soft tissue with normal saline solution and reapproximated the skin with interrupted 3-0 nylon suture.  Xeroform well-padded sterile dressing was applied.   The tourniquet was let down.  His fingers pinkened nicely.  He was taken to recovery room in stable condition.  Postoperatively, he can use his hand as comfort allows.  I will give discharge instructions for when to change his dressings and   to follow  up in the office as well.  VN/NUANCE  D:11/16/2019 T:11/16/2019 JOB:[id number]

## 2019-11-29 ENCOUNTER — Encounter: Payer: Self-pay | Admitting: Physician Assistant

## 2019-11-29 ENCOUNTER — Other Ambulatory Visit: Payer: Self-pay

## 2019-11-29 ENCOUNTER — Ambulatory Visit (INDEPENDENT_AMBULATORY_CARE_PROVIDER_SITE_OTHER): Payer: Medicare Other | Admitting: Physician Assistant

## 2019-11-29 DIAGNOSIS — G5601 Carpal tunnel syndrome, right upper limb: Secondary | ICD-10-CM

## 2019-11-29 NOTE — Progress Notes (Signed)
[name] returns today 2 weeks status post left carpal tunnel release.  He states he is overall doing well.  States some numbness is improved.  Still has some slight numbness in the middle and ring finger.  Otherwise no complaints.  Physical exam: Left hand sutures well approximated wound.  In the most proximal portion of the wound is fully healed.  There is no signs of infection.  He has good range of motion of the fingers.  Subjectively good sensation throughout the left hand to light touch.  Impression: Status post left carpal tunnel release 11/16/2019  Plan: Recommend he continue to wash the hand for hygiene purposes.  We will keep the sutures in until next Wednesday having back at that time for suture removal.  Questions were encouraged and answered.  We will have him continue to work on range of motion of the hand but no heavy lifting with the left hand.

## 2019-12-02 ENCOUNTER — Other Ambulatory Visit: Payer: Self-pay | Admitting: Cardiology

## 2019-12-04 ENCOUNTER — Ambulatory Visit: Payer: Medicare Other

## 2019-12-04 ENCOUNTER — Ambulatory Visit: Payer: Medicare Other | Attending: Internal Medicine

## 2019-12-04 ENCOUNTER — Other Ambulatory Visit: Payer: Self-pay

## 2019-12-04 DIAGNOSIS — Z23 Encounter for immunization: Secondary | ICD-10-CM

## 2019-12-04 NOTE — Progress Notes (Signed)
   Covid-19 Vaccination Clinic  Name:  [name]    MRN: [id number] DOB: 1936/05/06  12/04/2019  [name] was observed post Covid-19 immunization for 15 minutes without incident. He was provided with Vaccine Information Sheet and instruction to access the V-Safe system.   [name] was instructed to call 911 with any severe reactions post vaccine: Marland Kitchen Difficulty breathing  . Swelling of face and throat  . A fast heartbeat  . A bad rash all over body  . Dizziness and weakness   Immunizations Administered    Name Date Dose VIS Date Route   Pfizer COVID-19 Vaccine 12/04/2019  1:25 PM 0.3 mL 09/14/2019 Intramuscular   Manufacturer: [place]   Lot: [id number]   [place]: [id number]

## 2019-12-05 ENCOUNTER — Ambulatory Visit (INDEPENDENT_AMBULATORY_CARE_PROVIDER_SITE_OTHER): Payer: Medicare Other | Admitting: Orthopaedic Surgery

## 2019-12-05 ENCOUNTER — Encounter: Payer: Self-pay | Admitting: Orthopaedic Surgery

## 2019-12-05 ENCOUNTER — Other Ambulatory Visit: Payer: Self-pay

## 2019-12-05 DIAGNOSIS — G5602 Carpal tunnel syndrome, left upper limb: Secondary | ICD-10-CM

## 2019-12-05 NOTE — Progress Notes (Signed)
HPI: Mr. Point returns today follow-up status post left carpal tunnel release 11/15/2019.  He is overall doing well.  Still has pain involving his left long and ring finger but overall he feels that the numbness and pain in the hand is greatly dissipated from what he had prior to surgery.  2 days ago he was hanging some knives up for his wife and did have one of the nights punctured his left hand sustained a couple of cuts on his right hand.  Physical exam: Left hand small puncture wound just to the ulnar side of the surgical incision no signs of gross infection.  The surgical incision healing well and is well approximated with interrupted sutures.  No signs of infection.  Sensation grossly intact throughout the hand to light touch.  Impression: Status post left carpal tunnel release 11/15/2019  Plan: He will work on range of motion of the hand scar tissue mobilization.  He can apply Vaseline over the incision.  Otherwise keep the incision clean and dry.  He can wash the incision for hygiene purposes.  Follow-up with [place] in 3 weeks sooner if there is any questions concerns.

## 2019-12-07 ENCOUNTER — Other Ambulatory Visit: Payer: Self-pay | Admitting: Family Medicine

## 2019-12-07 DIAGNOSIS — E118 Type 2 diabetes mellitus with unspecified complications: Secondary | ICD-10-CM

## 2019-12-11 ENCOUNTER — Ambulatory Visit (INDEPENDENT_AMBULATORY_CARE_PROVIDER_SITE_OTHER): Payer: Medicare Other | Admitting: Family Medicine

## 2019-12-11 ENCOUNTER — Encounter: Payer: Self-pay | Admitting: Family Medicine

## 2019-12-11 ENCOUNTER — Other Ambulatory Visit: Payer: Self-pay

## 2019-12-11 VITALS — BP 128/86 | HR 68 | Temp 98.0°F | Wt 217.4 lb

## 2019-12-11 DIAGNOSIS — I25119 Atherosclerotic heart disease of native coronary artery with unspecified angina pectoris: Secondary | ICD-10-CM

## 2019-12-11 DIAGNOSIS — N1832 Chronic kidney disease, stage 3b: Secondary | ICD-10-CM

## 2019-12-11 DIAGNOSIS — E118 Type 2 diabetes mellitus with unspecified complications: Secondary | ICD-10-CM | POA: Diagnosis not present

## 2019-12-11 DIAGNOSIS — Z79899 Other long term (current) drug therapy: Secondary | ICD-10-CM

## 2019-12-11 DIAGNOSIS — R600 Localized edema: Secondary | ICD-10-CM | POA: Diagnosis not present

## 2019-12-11 DIAGNOSIS — Z789 Other specified health status: Secondary | ICD-10-CM

## 2019-12-11 DIAGNOSIS — E785 Hyperlipidemia, unspecified: Secondary | ICD-10-CM

## 2019-12-11 DIAGNOSIS — N529 Male erectile dysfunction, unspecified: Secondary | ICD-10-CM | POA: Diagnosis not present

## 2019-12-11 DIAGNOSIS — K219 Gastro-esophageal reflux disease without esophagitis: Secondary | ICD-10-CM

## 2019-12-11 MED ORDER — SILDENAFIL CITRATE 100 MG PO TABS: 100.0000 mg | ORAL_TABLET | Freq: Every day | ORAL | 5 refills | Status: DC | PRN

## 2019-12-11 NOTE — Progress Notes (Deleted)
[name] is a 84 y.o. male who presents for annual wellness visit and follow-up on chronic medical conditions.  He has the following concerns:   Immunizations and Health Maintenance Immunization History  Administered Date(s) Administered  . DTaP 02/19/2000  . Influenza Split 08/03/2002, 09/22/2006, 07/09/2008, 07/26/2012  . Influenza Whole 07/15/2009  . Influenza, High Dose Seasonal PF 08/17/2013, 05/29/2015, 10/19/2016, 07/20/2017, 07/20/2018, 06/19/2019  . PFIZER SARS-COV-2 Vaccination 11/09/2019, 12/04/2019  . Pneumococcal Conjugate-13 03/01/2017  . Pneumococcal Polysaccharide-23 01/19/2007, 06/04/2014  . Tdap 01/19/2007   Health Maintenance Due  Topic Date Due  . TETANUS/TDAP  01/18/2017  . COLONOSCOPY  09/21/2017  . FOOT EXAM  07/20/2018  . OPHTHALMOLOGY EXAM  03/30/2019    Last colonoscopy: Last PSA: Dentist: Ophtho: Exercise:  Other doctors caring for patient include:  Advanced Directives:    Depression screen:  See questionnaire below.     Depression screen [facility] 2/9 07/20/2018 03/01/2017 10/07/2015 10/08/2014  Decreased Interest 0 0 0 0  Down, Depressed, Hopeless 0 0 0 0  PHQ - 2 Score 0 0 0 0  Some recent data might be hidden    Fall Screen: See Questionaire below.   Fall Risk  08/29/2019 07/20/2018 03/01/2017 09/10/2016 10/08/2014  Falls in the past year? 1 No No No No  Comment Emmi Telephone Survey: data to providers prior to load - - Emmi Telephone Survey: data to providers prior to load -  Number falls in past yr: 1 - - - -  Comment Emmi Telephone Survey Actual Response = 1 - - - -  Injury with Fall? 0 - - - -    ADL screen:  See questionnaire below.  Functional Status Survey:     Review of Systems  Constitutional: -, -unexpected weight change, -anorexia, -fatigue Allergy: -sneezing, -itching, -congestion Dermatology: denies changing moles, rash, lumps ENT: -runny nose, -ear pain, -sore throat,  Cardiology:  -chest pain, -palpitations,  -orthopnea, Respiratory: -cough, -shortness of breath, -dyspnea on exertion, -wheezing,  Gastroenterology: -abdominal pain, -nausea, -vomiting, -diarrhea, -constipation, -dysphagia Hematology: -bleeding or bruising problems Musculoskeletal: -arthralgias, -myalgias, -joint swelling, -back pain, - Ophthalmology: -vision changes,  Urology: -dysuria, -difficulty urinating,  -urinary frequency, -urgency, incontinence Neurology: -, -numbness, , -memory loss, -falls, -dizziness    PHYSICAL EXAM:  There were no vitals taken for this visit.  General Appearance: Alert, cooperative, no distress, appears stated age Head: Normocephalic, without obvious abnormality, atraumatic Eyes: PERRL, conjunctiva/corneas clear, EOM's intact, fundi benign Ears: Normal TM's and external ear canals Nose: Nares normal, mucosa normal, no drainage or sinus   tenderness Throat: Lips, mucosa, and tongue normal; teeth and gums normal Neck: Supple, no lymphadenopathy, thyroid:no enlargement/tenderness/nodules; no carotid bruit or JVD Lungs: Clear to auscultation bilaterally without wheezes, rales or ronchi; respirations unlabored Heart: Regular rate and rhythm, S1 and S2 normal, no murmur, rub or gallop Abdomen: Soft, non-tender, nondistended, normoactive bowel sounds, no masses, no hepatosplenomegaly Extremities: No clubbing, cyanosis or edema Pulses: 2+ and symmetric all extremities Skin: Skin color, texture, turgor normal, no rashes or lesions Lymph nodes: Cervical, supraclavicular, and axillary nodes normal Neurologic: CNII-XII intact, normal strength, sensation and gait; reflexes 2+ and symmetric throughout   Psych: Normal mood, affect, hygiene and grooming  ASSESSMENT/PLAN:    Discussed PSA screening (risks/benefits), recommended at least 30 minutes of aerobic activity at least 5 days/week; proper sunscreen use reviewed; healthy diet and alcohol recommendations (less than or equal to 2 drinks/day) reviewed;  regular seatbelt use; changing batteries in smoke detectors. Immunization recommendations discussed.  Colonoscopy recommendations reviewed.   Medicare Attestation I have personally reviewed: The patient's medical and social history Their use of alcohol, tobacco or illicit drugs Their current medications and supplements The patient's functional ability including ADLs,fall risks, home safety risks, cognitive, and hearing and visual impairment Diet and physical activities Evidence for depression or mood disorders  The patient's weight, height, and BMI have been recorded in the chart.  I have made referrals, counseling, and provided education to the patient based on review of the above and I have provided the patient with a written personalized care plan for preventive services.     Jill Alexanders, MD   12/11/2019

## 2019-12-11 NOTE — Progress Notes (Signed)
Subjective:    Patient ID: [name], male    DOB: [birth year], 84 y.o.   MRN: [id number]  [name] is a 84 y.o. male who presents for follow-up of Type 2 diabetes mellitus.  Home blood sugar records: meter record 110-120 fasting  Current symptoms/problems include none at this time. Daily foot checks: yes   Any foot concerns: dry spots on heal Exercise: not much yard word Diet: healthy He states that he is taking his Metformin every other day as he was not ready to quit.  He is also taking Ranexa daily instead of twice per day and states he has had no chest pain, shortness of breath or PND.  He notes that he has seen swelling in the right lower extremity since Thanksgiving after a fall with injury to the right hip area.  He would like a renewal on his sildenafil.  He continues on losartan as well as amlodipine and having no difficulty with that.  He is statin intolerant and does use Zetia.  Continues on Plavix with no difficulties. The following portions of the patient's history were reviewed and updated as appropriate: allergies, current medications, past medical history, past social history and problem list.  ROS as in subjective above.     Objective:    Physical Exam Alert and in no distress .  Right calf does show 2+ pitting edema.  The skin is not hot or tender.  Negative Homans' sign.  Exam of the thigh area shows no lesions.  No tenderness over the hip. Exam of recent blood work does show evidence of CKD.   Lab Review Diabetic Labs Latest Ref Rng & Units 11/15/2019 06/13/2019 07/20/2018 07/17/2018 07/04/2018  HbA1c 4.8 - 5.6 % 5.8(H) - 5.7(A) - -  Microalbumin mg/L - - - - -  Micro/Creat Ratio - - - - - -  Chol 100 - 199 mg/dL - - - - 185  HDL >39 mg/dL - - - - 47  Calc LDL 0 - 99 mg/dL - - - - 115(H)  Triglycerides 0 - 149 mg/dL - - - - 115  Creatinine 0.61 - 1.24 mg/dL 1.65(H) 1.60(H) - 1.52(H) 1.99(H)  GFR >60.00 mL/min - - - - -   BP/Weight 11/16/2019 11/15/2019  11/15/2019 09/05/2019 [id number]  Systolic BP 123456 0000000 - 0000000 [id number]  Diastolic BP 78 71 - 77 76  Wt. (Lbs) 216.93 217 215 213.4 216  BMI 32.04 32.05 31.75 31.51 31.9   Foot/eye exam completion dates Latest Ref Rng & Units 03/29/2018 03/29/2018  Eye Exam No Retinopathy No Retinopathy No Retinopathy  Foot Form Completion - - -  Hemoglobin A1c is 6.1  [name]  reports that he quit smoking about 36 years ago. His smoking use included cigarettes. He has a 135.00 pack-year smoking history. He has never used smokeless tobacco. He reports current alcohol use of about 4.0 standard drinks of alcohol per week. He reports that he does not use drugs.     Assessment & Plan:    Type 2 diabetes mellitus with complication, without long-term current use of insulin (HCC)  Erectile dysfunction, unspecified erectile dysfunction type - Plan: sildenafil (VIAGRA) 100 MG tablet  Leg edema, right - Plan: VAS [place] LOWER EXTREMITY VENOUS (DVT)  Statin intolerance  Type II diabetes mellitus with complication -- CAD, AAA  Hyperlipidemia LDL goal <70; statin intolerant  CAD S/P CABG '95- several PCis since, last 05/17/14  Gastroesophageal reflux disease, unspecified whether esophagitis present  Encounter for long-term (  current) use of medications  ED (erectile dysfunction) of organic origin  Chronic renal impairment, stage 3b   1. Rx changes: Did recommend he go ahead and stop taking the Metformin 2. Education: Reviewed 'ABCs' of diabetes management (respective goals in parentheses):  A1C (<7), blood pressure (<130/80), and cholesterol (LDL <100). 3. Compliance at present is estimated to be good. Efforts to improve compliance (if necessary) will be directed at Continue with present medications.. 4. Follow up: 4 months The etiology behind the swelling in the right leg is questionable at this point and will need to be followed up on after the Doppler study. Discussed the fact that he is not taking the Ranexa properly  and he will follow-up with cardiology concerning that. Discussed the use of sildenafil with him.  He will continue on his other medications.

## 2019-12-12 ENCOUNTER — Ambulatory Visit (HOSPITAL_COMMUNITY)
Admission: RE | Admit: 2019-12-12 | Discharge: 2019-12-12 | Disposition: A | Discharge status: Home / Self Care | Payer: Medicare Other | Source: Ambulatory Visit | Attending: Family Medicine | Admitting: Family Medicine

## 2019-12-12 ENCOUNTER — Telehealth: Payer: Self-pay

## 2019-12-12 DIAGNOSIS — R600 Localized edema: Secondary | ICD-10-CM

## 2019-12-13 NOTE — Telephone Encounter (Signed)
ERROR

## 2019-12-26 ENCOUNTER — Other Ambulatory Visit: Payer: Self-pay

## 2019-12-26 ENCOUNTER — Encounter: Payer: Self-pay | Admitting: Orthopaedic Surgery

## 2019-12-26 ENCOUNTER — Ambulatory Visit (INDEPENDENT_AMBULATORY_CARE_PROVIDER_SITE_OTHER): Payer: Medicare Other | Admitting: Orthopaedic Surgery

## 2019-12-26 DIAGNOSIS — G5602 Carpal tunnel syndrome, left upper limb: Secondary | ICD-10-CM

## 2019-12-26 DIAGNOSIS — Z9889 Other specified postprocedural states: Secondary | ICD-10-CM

## 2019-12-26 NOTE — Progress Notes (Signed)
The patient comes in today at 6-week status post a left open carpal tunnel release.  He is 84 years old.  He has been having some pain in his ring finger.  He also has some pain just pushing up using that hand.  Otherwise though he seems to be doing well.  On examination of his left operative hand incisions healed completely.  He has improved grip strength and pinch strength on the left side.  He does have pain over the A1 pulley of the ring finger and there is active triggering as well.  I explained to him what trigger finger means.  I did offer steroid injection in this area but he is deferred this.  I then recommended Voltaren gel to try on both his palm where he has pillar pain as well as over the A1 pulley.  All questions and concerns were answered and addressed.  I told him if the triggering worsens or is not satisfied to come back and see [place] for steroid injection.  Otherwise, follow-up can be as needed.

## 2020-01-10 DIAGNOSIS — L718 Other rosacea: Secondary | ICD-10-CM | POA: Diagnosis not present

## 2020-01-10 DIAGNOSIS — L570 Actinic keratosis: Secondary | ICD-10-CM | POA: Diagnosis not present

## 2020-01-10 DIAGNOSIS — L578 Other skin changes due to chronic exposure to nonionizing radiation: Secondary | ICD-10-CM | POA: Diagnosis not present

## 2020-01-21 ENCOUNTER — Other Ambulatory Visit: Payer: Self-pay | Admitting: Cardiology

## 2020-03-06 DIAGNOSIS — L718 Other rosacea: Secondary | ICD-10-CM | POA: Diagnosis not present

## 2020-03-06 DIAGNOSIS — L570 Actinic keratosis: Secondary | ICD-10-CM | POA: Diagnosis not present

## 2020-05-20 ENCOUNTER — Other Ambulatory Visit: Payer: Self-pay | Admitting: Cardiology

## 2020-05-21 ENCOUNTER — Other Ambulatory Visit: Payer: Self-pay

## 2020-05-21 ENCOUNTER — Other Ambulatory Visit: Payer: Self-pay | Admitting: Interventional Radiology

## 2020-05-21 DIAGNOSIS — I714 Abdominal aortic aneurysm, without rupture, unspecified: Secondary | ICD-10-CM

## 2020-05-21 DIAGNOSIS — T82330D Leakage of aortic (bifurcation) graft (replacement), subsequent encounter: Secondary | ICD-10-CM

## 2020-05-21 DIAGNOSIS — IMO0001 Reserved for inherently not codable concepts without codable children: Secondary | ICD-10-CM

## 2020-05-29 ENCOUNTER — Encounter: Payer: Self-pay | Admitting: Cardiology

## 2020-05-29 ENCOUNTER — Other Ambulatory Visit: Payer: Self-pay

## 2020-05-29 ENCOUNTER — Ambulatory Visit (INDEPENDENT_AMBULATORY_CARE_PROVIDER_SITE_OTHER): Payer: Medicare Other | Admitting: Cardiology

## 2020-05-29 VITALS — BP 164/82 | HR 63 | Ht 69.0 in | Wt 213.6 lb

## 2020-05-29 DIAGNOSIS — I110 Hypertensive heart disease with heart failure: Secondary | ICD-10-CM | POA: Diagnosis not present

## 2020-05-29 DIAGNOSIS — I350 Nonrheumatic aortic (valve) stenosis: Secondary | ICD-10-CM | POA: Diagnosis not present

## 2020-05-29 DIAGNOSIS — E118 Type 2 diabetes mellitus with unspecified complications: Secondary | ICD-10-CM

## 2020-05-29 DIAGNOSIS — Z955 Presence of coronary angioplasty implant and graft: Secondary | ICD-10-CM

## 2020-05-29 DIAGNOSIS — I2119 ST elevation (STEMI) myocardial infarction involving other coronary artery of inferior wall: Secondary | ICD-10-CM

## 2020-05-29 DIAGNOSIS — I5032 Chronic diastolic (congestive) heart failure: Secondary | ICD-10-CM

## 2020-05-29 DIAGNOSIS — E785 Hyperlipidemia, unspecified: Secondary | ICD-10-CM

## 2020-05-29 DIAGNOSIS — I25119 Atherosclerotic heart disease of native coronary artery with unspecified angina pectoris: Secondary | ICD-10-CM | POA: Diagnosis not present

## 2020-05-29 MED ORDER — VALSARTAN 320 MG PO TABS: 320.0000 mg | ORAL_TABLET | Freq: Every day | ORAL | 3 refills | Status: DC

## 2020-05-29 NOTE — Patient Instructions (Signed)
Medication Instructions:   Once you finish the current bottle of  Losartan stop taking it And Start Valsaratn 320 mg one tablet daily   *If you need a refill on your cardiac medications before your next appointment, please call your pharmacy*   Lab Work: Not needed   Testing/Procedures: Will be schedule at [address] - Aug 2022 May need to have covid testing 3 days prior - if Covid protocol is still in place Your physician has requested that you have a lexiscan myoview. Please follow instruction sheet, as given.   Follow-Up: At [facility], you and your health needs are our priority.  As part of our continuing mission to provide you with exceptional heart care, we have created designated Provider Care Teams.  These Care Teams include your primary Cardiologist (physician) and Advanced Practice Providers (APPs -  Physician Assistants and Nurse Practitioners) who all work together to provide you with the care you need, when you need it.    Your next appointment:   12 month(s)  The format for your next appointment:   In Person  Provider:   Glenetta Hew, MD   Other Instructions

## 2020-05-29 NOTE — Progress Notes (Addendum)
Primary Care Provider: Denita Lung, MD Cardiologist: Glenetta Hew, MD Electrophysiologist: None  Clinic Note: Chief Complaint  Patient presents with   Follow-up    Annual   Fatigue    Exertional with exertional dyspnea   Coronary Artery Disease    No true angina    HPI:    [name] is a 84 y.o. male with a Cardiovascular Disease history noted below who presents today for annual follow-up complaint with complaints of exertional dyspnea and fatigue.  Long-standing history of CAD dating back to 1985: Inferior STEMI 1985: Eventually led to two-vessel CABG (LIMA-LAD, SVG-OM) in [place] showed occlusion of the SVG-OM.  --> PCI in 2004 and 2007, 2015 and then most recently 2018. '04: Staged Taxus DES PCI to RCA (2 Taxus 2.75 x 32 & 12) and Cx-OM (Taxus 3 x 20);; '07 - PCI to pRCA ISR- Cypher DES PCI August 2015 -- PCI to the RCA & OM (in-stent restenosis, and OM). Follow-up Showed patent stents and grafts. July 18-20, 2018 non-STEMI: Cath revealed CTO occlusion of pLAD (not PCI target). Patent LIMA-LAD & RCA with patent LCx-OM stents  -- med Rx:  He was put back on Plavix. Started on Ranexa; patent LIMA-LAD and RCA as well as circumflex stents He also has history of AAA s/p EVAR  April 2018:  Embolization of Lumbar Artery- VIR (Dr. Barbie Banner). Jan 2018: IMA "stabilization" by Dr. Donzetta Matters. June 2018-- f/u with Dr Donzetta Matters ([facility]) for AAA -  Noted to have a type II endoleak.   Coil embolization March 25, 2017 Describes claudication symptoms in both calves after walking to having 3 blocks He is intolerant of statins currently on fenofibrate (having stopped Zetia).  [name] was last seen on May 24, 2019.  He was doing okay at that time.  No major complaints.  Limited by hip pain is keeping it from walking a lot.  He was trying to stay active.  Chronic) edema.  Did not like using diuretic.  Noted some exertional dyspnea, frequent nocturia and hip  pain.  Recent Hospitalizations:  11/16/2019: Left carpal tunnel release  Reviewed  CV studies:    The following studies were reviewed today: (if available, images/films reviewed: From Epic Chart or Care Everywhere) N/A:  Interval History:   [name] is here today for follow-up actually almost depressed appearance.  He does not seem his normal somewhat chipper self.  He says that he still has discomfort in his legs with walking.  Not sure if his claudication or because of his hip and back pain. He stopped taking diuretic because of nocturia and frequent urination.  He still has left sided pain.  His major complaint today is that he just is tired all the time.  He gets short of breath pretty easily.  Only able to walk about 1/4 mile before having to stop.  He is not noticing any chest pain and is more fatigued and dyspnea.  He just feels as though he is worn out.  He acknowledges that is not really as active as he had been.  CV Review of Symptoms (Summary) Cardiovascular ROS: positive for - dyspnea on exertion, edema and Fatigue negative for - chest pain, irregular heartbeat, orthopnea, palpitations, paroxysmal nocturnal dyspnea, rapid heart rate, shortness of breath or Syncope/near syncope, TIA/amaurosis fugax, claudication  The patient does not have symptoms concerning for COVID-19 infection (fever, chills, cough, or new shortness of breath).  The patient is practicing social distancing &  Masking.  Immunization History  Administered Date(s) Administered   DTaP 02/19/2000   Influenza Split 08/03/2002, 09/22/2006, 07/09/2008, 07/26/2012   Influenza Whole 07/15/2009   Influenza, High Dose Seasonal PF 08/17/2013, 05/29/2015, 10/19/2016, 07/20/2017, 07/20/2018, 06/19/2019   PFIZER SARS-COV-2 Vaccination 11/09/2019, 12/04/2019   Pneumococcal Conjugate-13 03/01/2017   Pneumococcal Polysaccharide-23 01/19/2007, 06/04/2014   Tdap 01/19/2007    REVIEWED OF SYSTEMS   Review of Systems   Constitutional: Positive for malaise/fatigue (Really gets tired easily.).  HENT: Negative for congestion and sinus pain.   Respiratory: Positive for shortness of breath (Only with exertion).   Cardiovascular: Positive for claudication (Still has leg pain when walking.).  Genitourinary: Positive for frequency (Mostly nocturia.).       Stopped taking diuretic because of frequent urination.  Musculoskeletal: Positive for back pain (Back seems to be hurting more than hip) and joint pain (Hips and knees still hurt).  Neurological: Negative for dizziness and focal weakness.  Psychiatric/Behavioral: Negative.     I have reviewed and (if needed) personally updated the patient's problem list, medications, allergies, past medical and surgical history, social and family history.   PAST MEDICAL HISTORY   Past Medical History:  Diagnosis Date   Adenomatous colon polyp    Ankle edema    Chronic   Asthma    CAD S/P percutaneous coronary angioplasty 06/2003, 12/2005, 05/2014   a) '04: Staged Taxus DES PCI to RCA (2 Taxus 2.75 x 32 & 12) and Cx-OM (Taxus 3 x 20);;'07 - PCI to pRCA ISR- Cypher DES; c) 05/2014: pPCI pRCA stentISR (Promus DES 3 x 8) OM1 distal to stent (Promus DES 2.75 x 12 - 3.1)   CAD, multiple vessel 1985   Most recent July 2018: Admitted for non-STEMI. Occluded LAD with patent LIMA (SP1 now occluded).  Patent stents in proximal and mid RCA as well as circumflex-OM 3. Known occlusion of SVG-OM. EF 45-50%   CHF (congestive heart failure) ([employer])    Cholelithiasis - with cholangitis & choledocholithiasis    status post ERCP with removal of calculi and biliary stent placement.   Chronic anemia    On iron supplement; history of positive guaiac - negative colonoscopy in 1996.; Thought to be related to hemorrhoids; status post hemorrhoidectomy   Chronic back pain     multiple surgeries; C-spine and lumbar   Diabetes mellitus    on oral medication   Diverticulitis of colon 1996    Diverticulosis    Dyslipidemia, goal LDL below 70    Erectile dysfunction    Exertional dyspnea    Chronic baseline SOB with ambulation   GERD (gastroesophageal reflux disease)    Grade II diastolic dysfunction    H/O: pneumonia February 14   Hemorrhoids    Hiatal hernia    History of: ST elevation myocardial infarction (STEMI) involving left circumflex coronary artery with complication 1017   PTCA-circumflex; PCI in 1991   Hypertension    Moderate aortic stenosis by prior echocardiogram 07/2015   Normal LV function - EF 55-60%.. Abnormal relaxation. Mild-moderate aortic stenosis (peak/mean gradient 18/10 mmH)   Osteoarthritis of both knees    And back; multiple back surgeries, right knee arthroplasty and left knee arthroscopic surgery x2   Pneumonia 2016   S/P AAA (abdominal aortic aneurysm) repair 08/30/2012   s/p EVAR   S/P CABG x 2 1997   LIMA-LAD, SVG-OM   Sleep apnea    pt. states he was told to return for f/u, to be fitted for Cpap, but pt.  reports that he didn't follow up-no cpap used    PAST SURGICAL HISTORY   Past Surgical History:  Procedure Laterality Date   Abdominal and Lower Extremity Arterial Ultrasound  08/23/2012; 10/10/2013   Normal ABIs. Nonocclusive lower extremity disease. 4.2 cm x 4.3 cm infrarenal AAA;; 4.4 cm x 4.3 cm (essentially stable)    ABDOMINAL AORTIC ENDOVASCULAR STENT GRAFT N/A 08/13/2015   Procedure: ABDOMINAL AORTIC ENDOVASCULAR STENT GRAFT;  Surgeon: Mal Misty, MD;  Location: [facility];  Service: Vascular;  Laterality: N/A;   Anterior cervical plating  04/23/10   At C4-5 and a C6-7 utilizing two separate Biomet MaxAn plates.   ANTERIOR LAT LUMBAR FUSION Left 11/22/2012   Procedure: ANTERIOR LATERAL LUMBAR FUSION 1 LEVEL;  Surgeon: Eustace Moore, MD;  Location: [place] [facility];  Service: Neurosurgery;  Laterality: Left;  Anterior Lateral Lumbar Fusion Lumbar Three-Four   BACK SURGERY  1979 & x 10   pt. remarks, "I have had about 10 back surgeries"    BILIARY STENT PLACEMENT N/A 07/03/2014   Procedure: BILIARY STENT PLACEMENT;  Surgeon: Inda Castle, MD;  Location: [place];  Service: Endoscopy;  Laterality: N/A;   CARDIAC CATHETERIZATION  September 2004   None Occluded vein graft to OM; diffuse RCA disease in the mid vessel, 80% circumflex-OM stenosis; follow on AV groove circumflex with sequential 90% stenoses and intervening saccular dilation    CARDIAC CATHETERIZATION  March 2010   4 abnormal Myoview showing apical thinning (possibly due to apical LAD 95%) : 100% Occluded LAD after SV1, distal LAD grafted via LIMA -apical 95% . Cx -OM1 w/patent stent extending into OM 1 . Follow on Cx - 70-80% - non-amenable PCI. RCA widely patent 3 overlapping stents in mRCA w/less than 40% stenosisin RPL; SVG-OM known occluded    CARPAL TUNNEL RELEASE Left 11/16/2019   Procedure: LEFT CARPAL TUNNEL RELEASE;  Surgeon: Mcarthur Rossetti, MD;  Location: [facility];  Service: Orthopedics;  Laterality: Left;   CATARACT EXTRACTION     Cervical arthrodesis  04/23/10   Anterior cervical arthrodesis, C4-5, C6-7 utilizing 7-mm PEEK interbody cage packed with local autograft & Antifuse putty at C4-5 & an 8-mm cage at C6-7.   CERVICAL DISCECTOMY  04/23/10   Decompressive anterior carvical diskectomy. C4-5, C6-7   CHOLECYSTECTOMY N/A 05/23/2015   Procedure: LAPAROSCOPIC CHOLECYSTECTOMY WITH INTRAOPERATIVE CHOLANGIOGRAM;  Surgeon: Alphonsa Overall, MD;  Location: [facility];  Service: General;  Laterality: N/A;   [place]  1985,1991,15   1985 lateral STEMI Circumflex PTCA;    CORONARY ARTERY BYPASS GRAFT  1997   LIMA-LAD, SVG-OM (SVG known to be occluded prior to 2004)   ERCP N/A 07/03/2014   Procedure: ENDOSCOPIC RETROGRADE CHOLANGIOPANCREATOGRAPHY (ERCP);  Surgeon: Inda Castle, MD;  Location: [place];  Service: Endoscopy;  Laterality: N/A;   ERCP N/A 07/05/2014   Procedure: ENDOSCOPIC RETROGRADE CHOLANGIOPANCREATOGRAPHY  (ERCP);  Surgeon: Inda Castle, MD;  Location: [place];  Service: Endoscopy;  Laterality: N/A;   ERCP N/A 06/30/2015   Procedure: ENDOSCOPIC RETROGRADE CHOLANGIOPANCREATOGRAPHY (ERCP);  Surgeon: Ladene Artist, MD;  Location: [facility];  Service: Endoscopy;  Laterality: N/A;   EYE SURGERY     IR ANGIOGRAM EXTREMITY LEFT  01/18/2017   IR ANGIOGRAM PELVIS SELECTIVE OR SUPRASELECTIVE  01/18/2017   IR ANGIOGRAM SELECTIVE EACH ADDITIONAL VESSEL  01/18/2017   IR AORTAGRAM ABDOMINAL SERIALOGRAM  01/18/2017   IR EMBO ARTERIAL NOT HEMORR HEMANG INC GUIDE ROADMAPPING  01/18/2017  IR RADIOLOGIST EVAL & MGMT  01/04/2017   IR RADIOLOGIST EVAL & MGMT  10/26/2017   IR RADIOLOGIST EVAL & MGMT  05/24/2018   IR RADIOLOGIST EVAL & MGMT  06/19/2019   IR US GUIDE VASC ACCESS RIGHT  01/18/2017   KNEE ARTHROSCOPY Left    x 2   LEFT HEART CATH AND CORS/GRAFTS ANGIOGRAPHY N/A 04/20/2017   Procedure: Left Heart Cath and Cors/Grafts Angiography;  Surgeon: Lorretta Harp, MD;  Location: [place] [facility];  LAD now occluded prior to SP1.LIMA-LAD. Patent RCA and circumflex stents. EF 45-50%. Relatively stable.    LEFT HEART CATHETERIZATION WITH CORONARY ANGIOGRAM N/A 05/15/2014   Procedure: LEFT HEART CATHETERIZATION WITH CORONARY ANGIOGRAM;  Surgeon: Sinclair Grooms, MD;  Location: [facility] CATH LAB;  Service: Cardiovascular;  Laterality: N/A;   LEFT HEART CATHETERIZATION WITH CORONARY/GRAFT ANGIOGRAM N/A 06/28/2014   Procedure: LEFT HEART CATHETERIZATION WITH Beatrix Fetters;  Surgeon: Troy Sine, MD;  Location: [facility] CATH LAB;  Service: Cardiovascular;  Laterality: N/A;   LUMBAR PERCUTANEOUS PEDICLE SCREW 1 LEVEL N/A 11/22/2012   Procedure: LUMBAR PERCUTANEOUS PEDICLE SCREW 1 LEVEL;  Surgeon: Eustace Moore, MD;  Location: [place] [facility];  Service: Neurosurgery;  Laterality: N/A;  Lumbar Three-Four Percutaneous Pedicle Screw, Lateral approach   MINOR HEMORRHOIDECTOMY     NM MYOVIEW LTD  December 2013   LOW RISK.  Mmoderate region of mid to basal inferolateral scar without ischemia. Mild apical hypokinesis with an EF of 47%.   PERCUTANEOUS CORONARY STENT INTERVENTION (PCI-S)  September 2004   PCI - RCA 2 overlapping Taxus DES 2.75 mm x 32 mm and 2.75 mm x 12 mm (3.0 mm); PCI-Cx-OM1 - Taxus DES 3.0 mm x 20 mm (3.1 mm);    PERCUTANEOUS CORONARY STENT INTERVENTION (PCI-S)  12/2005   80% ISR in proximal Taxus stent in RCA -- covered proximally with Cypher DES 3.0 mm x 12 mm   PERCUTANEOUS CORONARY STENT INTERVENTION (PCI-S) N/A 05/17/2014   Procedure: PERCUTANEOUS CORONARY STENT INTERVENTION (PCI-S);  Surgeon: Sinclair Grooms, MD;  Location: [facility] CATH LAB: PCI pRCA stent ISR - Promus DES 3.0 x 8 (3.25), OM distal stent edge - Promus DES 2.75 x 12 (3.1)   PERIPHERAL VASCULAR CATHETERIZATION  11/03/2016   Procedure: Embolization;  Surgeon: Waynetta Sandy, MD;  Location: [place] [facility] LAB;  Service: Cardiovascular;;   POSTERIOR CERVICAL FUSION/FORAMINOTOMY N/A 05/02/2013   Procedure: POSTERIOR CERVICAL FUSION/FORAMINOTOMY CERVICAL SEVEN THORACIC-ONE;  Surgeon: Eustace Moore, MD;  Location: [place] [facility];  Service: Neurosurgery;  Laterality: N/A;  POSTERIOR CERVICAL FUSION/FORAMINOTOMY CERVICAL SEVEN THORACIC-ONE   SPHINCTEROTOMY N/A 06/30/2015   Procedure: SPHINCTEROTOMY;  Surgeon: Ladene Artist, MD;  Location: [facility];  Service: Endoscopy;  Laterality: N/A;   TOTAL KNEE ARTHROPLASTY Right    TOTAL KNEE ARTHROPLASTY Left 12/20/2016   Procedure: LEFT TOTAL KNEE ARTHROPLASTY;  Surgeon: Vickey Huger, MD;  Location: [place];  Service: Orthopedics;  Laterality: Left;   TRANSTHORACIC ECHOCARDIOGRAM  07/2016   EF 45-50%. Inferolateral hypokinesis (correlates with scar noted on Myoview). GR 1 DD. Mild aortic stenosis. Mod LAE.   TRANSTHORACIC ECHOCARDIOGRAM  07/2018   Normal LV size.  EF 50 to 55% with mild HK of inferolateral wall.  GRII DD.  Mild AS with mean gradient 15 mm or greater.  Mild ascending aortic  dilation.  Mildly increased PA pressures of 41 mmHg   UPPER GI ENDOSCOPY  07/03/2019   VISCERAL ANGIOGRAM  11/03/2016   Procedure: Visceral  Angiogram;  Surgeon: Waynetta Sandy, MD;  Location: [place] [facility] LAB;  Service: Cardiovascular;;   CARDIAC CATH 04/20/2017: New - LAD now 100% prior to SP1/Diag1.  Not good PCI option. Prox RCA to Mid RCA stents & Ost 3rd Mrg to 3rd Mrg stents, 0 %stenosed. Ost LAD to Prox LAD lesion, 100 %stenosed - prior to SP1/Diag1 (~ new)  LIMA-LAD and is normal in caliber and anatomically normal. There is mild left ventricular systolic dysfunction.  EF is 45-50% by visual estimate. Normal LVEDP   MEDICATIONS/ALLERGIES   Current Meds  Medication Sig   acetaminophen (TYLENOL) 500 MG tablet Take 1,000 mg by mouth every 6 (six) hours as needed for mild pain, moderate pain or headache. Reported on 03/16/2016   albuterol (PROVENTIL HFA;VENTOLIN HFA) 108 (90 Base) MCG/ACT inhaler Inhale 2 puffs into the lungs every 6 (six) hours as needed for wheezing or shortness of breath.   amLODipine (NORVASC) 5 MG tablet Take 1 tablet (5 mg total) by mouth daily.   aspirin EC 81 MG EC tablet Take 1 tablet (81 mg total) by mouth daily.   Choline Fenofibrate (FENOFIBRIC ACID) 135 MG CPDR TAKE ONE CAPSULE BY MOUTH EVERY DAY   clopidogrel (PLAVIX) 75 MG tablet Take 1 tablet by mouth once daily   ezetimibe (ZETIA) 10 MG tablet Take 1 tablet (10 mg total) by mouth daily.   glucose blood (ONETOUCH VERIO) test strip USE ONE STRIP TO CHECK GLUCOSE TWICE DAILY E11.9   HYDROcodone-acetaminophen (NORCO) 5-325 MG tablet Take 1 tablet by mouth every 6 (six) hours as needed for moderate pain.   Iron-Folic Acid-Vit W73 (IRON FORMULA PO) Take 65 mg by mouth 1 day or 1 dose.   losartan (COZAAR) 100 MG tablet Take 1 tablet (100 mg total) by mouth daily.   metFORMIN (GLUCOPHAGE) 500 MG tablet Take 1 tablet by mouth once daily with breakfast   Multiple Vitamins-Minerals (MULTIVITAMIN WITH  MINERALS) tablet Take 1 tablet by mouth daily.    pantoprazole (PROTONIX) 40 MG tablet Take 1 tablet by mouth once daily   ranolazine (RANEXA) 500 MG 12 hr tablet Take 1 tablet by mouth twice daily   sildenafil (REVATIO) 20 MG tablet TAKE 2-5 TABLETS BY MOUTH AS NEEDED FOR SEXUAL ACTIVITY   sildenafil (VIAGRA) 100 MG tablet Take 1 tablet (100 mg total) by mouth daily as needed for erectile dysfunction.    Allergies  Allergen Reactions   Oxycodone Shortness Of Breath and Cough   Lisinopril Cough   Statins Other (See Comments)    MYALGIAS    Welchol [Colesevelam Hcl] Itching    SOCIAL HISTORY/FAMILY HISTORY   Reviewed in Epic:  Pertinent findings: No new changes  OBJCTIVE -PE, EKG, labs   Wt Readings from Last 3 Encounters:  05/29/20 213 lb 9.6 oz (96.9 kg)  12/11/19 217 lb 6.4 oz (98.6 kg)  11/16/19 216 lb 14.9 oz (98.4 kg)    Physical Exam: BP (!) 164/82   Pulse 63   Ht 5\' 9"  (1.753 m)   Wt 213 lb 9.6 oz (96.9 kg)   SpO2 98%   BMI 31.54 kg/m  Physical Exam Vitals reviewed.  Constitutional:      General: He is not in acute distress.    Appearance: Normal appearance. He is obese. He is not ill-appearing.  HENT:     Head: Normocephalic and atraumatic.  Neck:     Vascular: No carotid bruit.     Comments: No JVD or HJR. Cardiovascular:  Rate and Rhythm: Normal rate and regular rhythm.     Pulses: Normal pulses.     Heart sounds: Murmur (2/6 SEM at RUSB) heard.  No friction rub. No gallop.   Pulmonary:     Effort: Pulmonary effort is normal. No respiratory distress.     Breath sounds: Normal breath sounds.  Chest:     Chest wall: No tenderness.  Abdominal:     General: Abdomen is flat. Bowel sounds are normal.     Palpations: Abdomen is soft. There is no mass (No HSM).  Musculoskeletal:        General: Swelling (1+ L>R LE) present. Normal range of motion.     Cervical back: Normal range of motion and neck supple.  Neurological:     General: No focal  deficit present.     Mental Status: He is alert and oriented to person, place, and time.  Psychiatric:        Behavior: Behavior normal.        Thought Content: Thought content normal.        Judgment: Judgment normal.     Comments: He seems quite down.      Adult ECG Report  Rate: 63 ;  Rhythm: normal sinus rhythm and 1  AVB. ~Incomplete RBBB.  Nonspecific ST-T wave changes. ;   Narrative Interpretation: Stable EKG  Recent Labs: Never got labs checked last year. Lab Results  Component Value Date   CHOL 185 07/04/2018   HDL 47 07/04/2018   LDLCALC 115 (H) 07/04/2018   TRIG 115 07/04/2018   CHOLHDL 3.9 07/04/2018   Lab Results  Component Value Date   CREATININE 1.65 (H) 11/15/2019   BUN 25 (H) 11/15/2019   NA 138 11/15/2019   K 4.7 11/15/2019   CL 109 11/15/2019   CO2 23 11/15/2019     ASSESSMENT/PLAN    Problem List Items Addressed This Visit     CAD S/P CABG '95- several PCis since, last 05/17/14 - Primary (Chronic)    A little bit concerned about his exertional dyspnea and fatigue.  This is a worrisome thing to him and he never really had true anginal symptoms to begin with. He is on Ranexa and amlodipine for antianginal benefit.  Not on beta-blocker because of fatigue.  Remains on maintenance dose aspirin plus clopidogrel.  For now okay to hold both 5-7 days preop for surgeries or procedures.   Plan: We will check for ischemic changes with MYOCARDIAL FUSION IMAGING/Myoview Increase afterload reduction by converting from losartan to Diovan Not on statin intolerance.  Is on fenofibrate      Relevant Medications   valsartan (DIOVAN) 320 MG tablet   Other Relevant Orders   EKG 12-Lead (Completed)   MYOCARDIAL PERFUSION IMAGING   History of ST elevation myocardial infarction (STEMI) of inferolateral wall (PTCA - 100% large lateral OM) (Chronic)    Known to be seen on Myoview. Need to determine if his EF is down.  Will be able to evaluate his EF by Myoview, but  low threshold to consider echo.      Relevant Medications   valsartan (DIOVAN) 320 MG tablet   Presence of drug coated stent in right coronary artery and circumflex coronary artery (Chronic)    On maintenance aspirin Plavix.  Okay to hold for procedures.      Relevant Orders   EKG 12-Lead (Completed)   MYOCARDIAL PERFUSION IMAGING   Type II diabetes mellitus with complication -- CAD, AAA (Chronic)  Is on Metformin now.  Cannot see what else he is on.  Will consider SGLT2 inhibitor on follow-up.      Relevant Medications   valsartan (DIOVAN) 320 MG tablet   Hypertensive heart disease with chronic diastolic congestive heart failure (HCC) (Chronic)    Class II NYHA dyspnea symptoms.  Not sure if this is related to hypertension or CAD. We do have room to increase amlodipine based on Myoview results.  He is reluctant to take any diuretic because of frequent urination..   We may need to consider spironolactone additional blood pressure      Relevant Medications   valsartan (DIOVAN) 320 MG tablet   Other Relevant Orders   EKG 12-Lead (Completed)   MYOCARDIAL PERFUSION IMAGING   Hyperlipidemia LDL goal <70; statin intolerant (Chronic)    Will need lipid reevaluation. Once we determine his ischemic findings with Myoview, we can obtain follow-up labs  He has been reluctant to try any other medications, but is tolerating fenofibrate.  We may build to consider Nexletol.      Relevant Medications   valsartan (DIOVAN) 320 MG tablet   Chronic diastolic CHF (congestive heart failure) (HCC) (Chronic)   Relevant Medications   valsartan (DIOVAN) 320 MG tablet   Other Relevant Orders   EKG 12-Lead (Completed)   MYOCARDIAL PERFUSION IMAGING   Mild aortic stenosis by prior echocardiogram (Chronic)    With exertional fatigue, we are checking Myoview.  However plan was to also check echocardiogram this year.  Once we are able to determine he is not actively having seen issues, we can proceed  with echo.      Relevant Medications   valsartan (DIOVAN) 320 MG tablet        COVID-19 Education: The signs and symptoms of COVID-19 were discussed with the patient and how to seek care for testing (follow up with PCP or arrange E-visit).   The importance of social distancing and COVID-19 vaccination was discussed today.  I spent a total of 26 minutes with the patient in direct patient consultation.  Additional time spent with chart review  / charting (studies, outside notes, etc): 10 Total Time: 36 min   Current medicines are reviewed at length with the patient today.  (+/- concerns) fatigue - DOE -? Related to medications  Notice: This dictation was prepared with Dragon dictation along with smaller phrase technology. Any transcriptional errors that result from this process are unintentional and may not be corrected upon review.  Patient Instructions / Medication Changes & Studies & Tests Ordered   Patient Instructions  Medication Instructions:   Once you finish the current bottle of  Losartan stop taking it And Start Valsaratn 320 mg one tablet daily   *If you need a refill on your cardiac medications before your next appointment, please call your pharmacy*   Lab Work: Not needed   Testing/Procedures: Will be schedule at [address] - Aug 2022 May need to have covid testing 3 days prior - if Covid protocol is still in place Your physician has requested that you have a lexiscan myoview. Please follow instruction sheet, as given.   Follow-Up: At [facility], you and your health needs are our priority.  As part of our continuing mission to provide you with exceptional heart care, we have created designated Provider Care Teams.  These Care Teams include your primary Cardiologist (physician) and Advanced Practice Providers (APPs -  Physician Assistants and Nurse Practitioners) who all work together to provide  you with the care you need, when you need  it.    Your next appointment:   12 month(s)  The format for your next appointment:   In Person  Provider:   Glenetta Hew, MD   Other Instructions   Studies Ordered:   Orders Placed This Encounter  Procedures   MYOCARDIAL PERFUSION IMAGING   EKG 12-Lead   Shared Decision Making/Informed Consent{  The risks [chest pain, shortness of breath, cardiac arrhythmias, dizziness, blood pressure fluctuations, myocardial infarction, stroke/transient ischemic attack, nausea, vomiting, allergic reaction, radiation exposure, metallic taste sensation and life-threatening complications (estimated to be 1 in 10,000)], benefits (risk stratification, diagnosing coronary artery disease, treatment guidance) and alternatives of a nuclear stress test were discussed in detail with Mr. Holcomb and he agrees to proceed.     Glenetta Hew, M.D., M.S. Interventional Cardiologist   Pager # [phone] Phone # [phone] [address] [place], [place] [id number]   Thank you for choosing [facility]!!

## 2020-06-01 ENCOUNTER — Encounter: Payer: Self-pay | Admitting: Cardiology

## 2020-06-01 NOTE — Assessment & Plan Note (Signed)
Known to be seen on Myoview. Need to determine if his EF is down.  Will be able to evaluate his EF by Myoview, but low threshold to consider echo.

## 2020-06-01 NOTE — Assessment & Plan Note (Signed)
With exertional fatigue, we are checking Myoview.  However plan was to also check echocardiogram this year.  Once we are able to determine he is not actively having seen issues, we can proceed with echo.

## 2020-06-01 NOTE — Assessment & Plan Note (Addendum)
A little bit concerned about his exertional dyspnea and fatigue.  This is a worrisome thing to him and he never really had true anginal symptoms to begin with. He is on Ranexa and amlodipine for antianginal benefit.  Not on beta-blocker because of fatigue.   Remains on maintenance dose aspirin plus clopidogrel.  For now okay to hold both 5-7 days preop for surgeries or procedures.   Plan: We will check for ischemic changes with MYOCARDIAL FUSION IMAGING/Myoview  Increase afterload reduction by converting from losartan to Diovan  Not on statin intolerance.  Is on fenofibrate

## 2020-06-01 NOTE — Assessment & Plan Note (Signed)
Is on Metformin now.  Cannot see what else he is on.  Will consider SGLT2 inhibitor on follow-up.

## 2020-06-01 NOTE — Assessment & Plan Note (Signed)
Class II NYHA dyspnea symptoms.  Not sure if this is related to hypertension or CAD. We do have room to increase amlodipine based on Myoview results.  He is reluctant to take any diuretic because of frequent urination..   We may need to consider spironolactone additional blood pressure

## 2020-06-01 NOTE — Assessment & Plan Note (Signed)
On maintenance aspirin Plavix.  Okay to hold for procedures.

## 2020-06-01 NOTE — Assessment & Plan Note (Addendum)
Will need lipid reevaluation. Once we determine his ischemic findings with Myoview, we can obtain follow-up labs  He has been reluctant to try any other medications, but is tolerating fenofibrate.  We may build to consider Nexletol.

## 2020-06-02 ENCOUNTER — Other Ambulatory Visit: Payer: Self-pay | Admitting: Cardiology

## 2020-06-04 ENCOUNTER — Other Ambulatory Visit: Payer: Self-pay

## 2020-06-04 ENCOUNTER — Ambulatory Visit (HOSPITAL_COMMUNITY)
Admission: RE | Admit: 2020-06-04 | Discharge: 2020-06-04 | Disposition: A | Discharge status: Home / Self Care | Payer: Medicare Other | Source: Ambulatory Visit | Attending: Interventional Radiology | Admitting: Interventional Radiology

## 2020-06-04 DIAGNOSIS — I714 Abdominal aortic aneurysm, without rupture, unspecified: Secondary | ICD-10-CM

## 2020-06-04 DIAGNOSIS — I701 Atherosclerosis of renal artery: Secondary | ICD-10-CM | POA: Diagnosis not present

## 2020-06-04 DIAGNOSIS — T82330D Leakage of aortic (bifurcation) graft (replacement), subsequent encounter: Secondary | ICD-10-CM | POA: Insufficient documentation

## 2020-06-04 DIAGNOSIS — I70203 Unspecified atherosclerosis of native arteries of extremities, bilateral legs: Secondary | ICD-10-CM | POA: Diagnosis not present

## 2020-06-04 DIAGNOSIS — IMO0001 Reserved for inherently not codable concepts without codable children: Secondary | ICD-10-CM

## 2020-06-04 DIAGNOSIS — K5730 Diverticulosis of large intestine without perforation or abscess without bleeding: Secondary | ICD-10-CM | POA: Diagnosis not present

## 2020-06-04 LAB — POCT I-STAT CREATININE: Creatinine, Ser: 1.7 mg/dL — ABNORMAL HIGH (ref 0.61–1.24)

## 2020-06-04 MED ORDER — IOHEXOL 350 MG/ML SOLN
75.0000 mL | Freq: Once | INTRAVENOUS | Status: AC | PRN
  Administered 2020-06-04: 75 mL via INTRAVENOUS

## 2020-06-05 DIAGNOSIS — H353131 Nonexudative age-related macular degeneration, bilateral, early dry stage: Secondary | ICD-10-CM | POA: Diagnosis not present

## 2020-06-05 DIAGNOSIS — H40013 Open angle with borderline findings, low risk, bilateral: Secondary | ICD-10-CM | POA: Diagnosis not present

## 2020-06-05 DIAGNOSIS — E119 Type 2 diabetes mellitus without complications: Secondary | ICD-10-CM | POA: Diagnosis not present

## 2020-06-05 LAB — HM DIABETES EYE EXAM

## 2020-06-11 ENCOUNTER — Encounter: Payer: Self-pay | Admitting: *Deleted

## 2020-06-11 ENCOUNTER — Other Ambulatory Visit: Payer: Self-pay

## 2020-06-11 ENCOUNTER — Ambulatory Visit
Admission: RE | Admit: 2020-06-11 | Discharge: 2020-06-11 | Disposition: A | Discharge status: Home / Self Care | Payer: Medicare Other | Source: Ambulatory Visit | Attending: Interventional Radiology | Admitting: Interventional Radiology

## 2020-06-11 DIAGNOSIS — Z9889 Other specified postprocedural states: Secondary | ICD-10-CM | POA: Diagnosis not present

## 2020-06-11 DIAGNOSIS — IMO0001 Reserved for inherently not codable concepts without codable children: Secondary | ICD-10-CM

## 2020-06-11 DIAGNOSIS — I714 Abdominal aortic aneurysm, without rupture, unspecified: Secondary | ICD-10-CM

## 2020-06-11 DIAGNOSIS — T82330D Leakage of aortic (bifurcation) graft (replacement), subsequent encounter: Secondary | ICD-10-CM | POA: Diagnosis not present

## 2020-06-11 HISTORY — PX: IR RADIOLOGIST EVAL & MGMT: IMG5224

## 2020-06-11 NOTE — Progress Notes (Signed)
Chief Complaint: Endoleak  Referring Physician(s): Dr. Donzetta Matters.   PCP: Dr. Jill Alexanders  History of Present Illness: [name] is an 84 y.o. male presenting to [place] clinic today as a scheduled follow up for his type II endoleak.    He is well known to our service, with his first referral to my partner, the late Dr. Barbie Banner, 01/04/2017.    He joins [place] today via telemedicine visit with his wife on the call, given the current COVID crisis.  We confirmed his identity with 2 personal identifiers.   He has history of AAA treated with infra-renal fixation 08/13/2015.  This was performed with a Gore Excluder, [id number] main body, via the right, and left limb of 14x10.  The pre-operative diameter was ~5cm.  He had a type II endoleak identified, with interval growth of the excluded sac.  The IMA was embolized via TA approach by Dr. Donzetta Matters 11/03/2016, and then the L4 lumbar arteries were embolized by Dr. Barbie Banner 01/04/2017 for regrowth, to maximum diameter of 5.6cm.    [name] has had 4 CT scans after his last treatment with Dr. Barbie Banner.  His most recent exam performed 06/04/20. On this study the diameter is estimated about 6cm, which, overall, is not significantly different from the prior which was estimated maximal diameter of 5.8cm.   On my own review of the study, I am estimating diameter of ~5.9cm.  Importantly, there are no concerning features such as inflammation, wall thickening, or fluid.  There is no migration or evidence of type I leak. There is evidence of persisting type II leak.   [name] is feeling fine, and denies any abdominal or flank pain.  Denies any new symptoms.  Denies any blood in the stool.  He has no fevers, rigors, chills, or recent hospitalization.  He continues maximal medical care, including medication for blood pressure control.      Past Medical History:  Diagnosis Date  . Adenomatous colon polyp   . Ankle edema    Chronic  . Asthma   . CAD S/P percutaneous  coronary angioplasty 06/2003, 12/2005, 05/2014   a) '04: Staged Taxus DES PCI to RCA (2 Taxus 2.75 x 32 & 12) and Cx-OM (Taxus 3 x 20);;'07 - PCI to pRCA ISR- Cypher DES; c) 05/2014: pPCI pRCA stentISR (Promus DES 3 x 8) OM1 distal to stent (Promus DES 2.75 x 12 - 3.1)  . CAD, multiple vessel 1985   Most recent July 2018: Admitted for non-STEMI. Occluded LAD with patent LIMA (SP1 now occluded).  Patent stents in proximal and mid RCA as well as circumflex-OM 3. Known occlusion of SVG-OM. EF 45-50%  . CHF (congestive heart failure) ([place])   . Cholelithiasis - with cholangitis & choledocholithiasis    status post ERCP with removal of calculi and biliary stent placement.  . Chronic anemia    On iron supplement; history of positive guaiac - negative colonoscopy in 1996.; Thought to be related to hemorrhoids; status post hemorrhoidectomy  . Chronic back pain     multiple surgeries; C-spine and lumbar  . Diabetes mellitus    on oral medication  . Diverticulitis of colon 1996  . Diverticulosis   . Dyslipidemia, goal LDL below 70   . Erectile dysfunction   . Exertional dyspnea    Chronic baseline SOB with ambulation  . GERD (gastroesophageal reflux disease)   . Grade II diastolic dysfunction   . H/O: pneumonia February 14  . Hemorrhoids   .  Hiatal hernia   . History of: ST elevation myocardial infarction (STEMI) involving left circumflex coronary artery with complication 9622   PTCA-circumflex; PCI in 1991  . Hypertension   . Moderate aortic stenosis by prior echocardiogram 07/2015   Normal LV function - EF 55-60%.. Abnormal relaxation. Mild-moderate aortic stenosis (peak/mean gradient 18/10 mmH)  . Osteoarthritis of both knees    And back; multiple back surgeries, right knee arthroplasty and left knee arthroscopic surgery x2  . Pneumonia 2016  . S/P AAA (abdominal aortic aneurysm) repair 08/30/2012   s/p EVAR  . S/P CABG x 2 1997   LIMA-LAD, SVG-OM  . Sleep apnea    pt. states he was told to  return for f/u, to be fitted for Cpap, but pt. reports that he didn't follow up-no cpap used    Past Surgical History:  Procedure Laterality Date  . Abdominal and Lower Extremity Arterial Ultrasound  08/23/2012; 10/10/2013   Normal ABIs. Nonocclusive lower extremity disease. 4.2 cm x 4.3 cm infrarenal AAA;; 4.4 cm x 4.3 cm (essentially stable)   . ABDOMINAL AORTIC ENDOVASCULAR STENT GRAFT N/A 08/13/2015   Procedure: ABDOMINAL AORTIC ENDOVASCULAR STENT GRAFT;  Surgeon: Mal Misty, MD;  Location: [facility];  Service: Vascular;  Laterality: N/A;  . Anterior cervical plating  04/23/10   At C4-5 and a C6-7 utilizing two separate Biomet MaxAn plates.  . ANTERIOR LAT LUMBAR FUSION Left 11/22/2012   Procedure: ANTERIOR LATERAL LUMBAR FUSION 1 LEVEL;  Surgeon: Eustace Moore, MD;  Location: [place] [facility];  Service: Neurosurgery;  Laterality: Left;  Anterior Lateral Lumbar Fusion Lumbar Three-Four  . BACK SURGERY  1979 & x 10   pt. remarks, "I have had about 10 back surgeries"  . BILIARY STENT PLACEMENT N/A 07/03/2014   Procedure: BILIARY STENT PLACEMENT;  Surgeon: Inda Castle, MD;  Location: [place];  Service: Endoscopy;  Laterality: N/A;  . CARDIAC CATHETERIZATION  September 2004   None Occluded vein graft to OM; diffuse RCA disease in the mid vessel, 80% circumflex-OM stenosis; follow on AV groove circumflex with sequential 90% stenoses and intervening saccular dilation   . CARDIAC CATHETERIZATION  March 2010   4 abnormal Myoview showing apical thinning (possibly due to apical LAD 95%) : 100% Occluded LAD after SV1, distal LAD grafted via LIMA -apical 95% . Cx -OM1 w/patent stent extending into OM 1 . Follow on Cx - 70-80% - non-amenable PCI. RCA widely patent 3 overlapping stents in mRCA w/less than 40% stenosisin RPL; SVG-OM known occluded   . CARPAL TUNNEL RELEASE Left 11/16/2019   Procedure: LEFT CARPAL TUNNEL RELEASE;  Surgeon: Mcarthur Rossetti, MD;  Location: [facility];  Service:  Orthopedics;  Laterality: Left;  . CATARACT EXTRACTION    . Cervical arthrodesis  04/23/10   Anterior cervical arthrodesis, C4-5, C6-7 utilizing 7-mm PEEK interbody cage packed with local autograft & Antifuse putty at C4-5 & an 8-mm cage at C6-7.  Marland Kitchen CERVICAL DISCECTOMY  04/23/10   Decompressive anterior carvical diskectomy. C4-5, C6-7  . CHOLECYSTECTOMY N/A 05/23/2015   Procedure: LAPAROSCOPIC CHOLECYSTECTOMY WITH INTRAOPERATIVE CHOLANGIOGRAM;  Surgeon: Alphonsa Overall, MD;  Location: [facility];  Service: General;  Laterality: N/A;  . [place]  . CORONARY ANGIOPLASTY  1985,1991,15   1985 lateral STEMI Circumflex PTCA;   . CORONARY ARTERY BYPASS GRAFT  1997   LIMA-LAD, SVG-OM (SVG known to be occluded prior to 2004)  . ERCP N/A 07/03/2014   Procedure: ENDOSCOPIC RETROGRADE CHOLANGIOPANCREATOGRAPHY (ERCP);  Surgeon: Herbie Baltimore  Shaaron Adler, MD;  Location: [place];  Service: Endoscopy;  Laterality: N/A;  . ERCP N/A 07/05/2014   Procedure: ENDOSCOPIC RETROGRADE CHOLANGIOPANCREATOGRAPHY (ERCP);  Surgeon: Inda Castle, MD;  Location: [facility];  Service: Endoscopy;  Laterality: N/A;  . ERCP N/A 06/30/2015   Procedure: ENDOSCOPIC RETROGRADE CHOLANGIOPANCREATOGRAPHY (ERCP);  Surgeon: Ladene Artist, MD;  Location: [facility];  Service: Endoscopy;  Laterality: N/A;  . EYE SURGERY    . IR ANGIOGRAM EXTREMITY LEFT  01/18/2017  . IR ANGIOGRAM PELVIS SELECTIVE OR SUPRASELECTIVE  01/18/2017  . IR ANGIOGRAM SELECTIVE EACH ADDITIONAL VESSEL  01/18/2017  . IR AORTAGRAM ABDOMINAL SERIALOGRAM  01/18/2017  . IR EMBO ARTERIAL NOT HEMORR HEMANG INC GUIDE ROADMAPPING  01/18/2017  . IR RADIOLOGIST EVAL & MGMT  01/04/2017  . IR RADIOLOGIST EVAL & MGMT  10/26/2017  . IR RADIOLOGIST EVAL & MGMT  05/24/2018  . IR RADIOLOGIST EVAL & MGMT  06/19/2019  . IR US GUIDE VASC ACCESS RIGHT  01/18/2017  . KNEE ARTHROSCOPY Left    x 2  . LEFT HEART CATH AND CORS/GRAFTS ANGIOGRAPHY N/A 04/20/2017   Procedure: Left Heart Cath and  Cors/Grafts Angiography;  Surgeon: Lorretta Harp, MD;  Location: [facility] INVASIVE CV LAB;  LAD now occluded prior to SP1.LIMA-LAD. Patent RCA and circumflex stents. EF 45-50%. Relatively stable.   Marland Kitchen LEFT HEART CATHETERIZATION WITH CORONARY ANGIOGRAM N/A 05/15/2014   Procedure: LEFT HEART CATHETERIZATION WITH CORONARY ANGIOGRAM;  Surgeon: Sinclair Grooms, MD;  Location: [facility] CATH LAB;  Service: Cardiovascular;  Laterality: N/A;  . LEFT HEART CATHETERIZATION WITH CORONARY/GRAFT ANGIOGRAM N/A 06/28/2014   Procedure: LEFT HEART CATHETERIZATION WITH Beatrix Fetters;  Surgeon: Troy Sine, MD;  Location: [facility] C[facility] LAB;  Service: Cardiovascular;  Laterality: N/A;  . LUMBAR PERCUTANEOUS PEDICLE SCREW 1 LEVEL N/A 11/22/2012   Procedure: LUMBAR PERCUTANEOUS PEDICLE SCREW 1 LEVEL;  Surgeon: Eustace Moore, MD;  Location: [place] [facility];  Service: Neurosurgery;  Laterality: N/A;  Lumbar Three-Four Percutaneous Pedicle Screw, Lateral approach  . MINOR HEMORRHOIDECTOMY    . NM MYOVIEW LTD  December 2013   LOW RISK. Mmoderate region of mid to basal inferolateral scar without ischemia. Mild apical hypokinesis with an EF of 47%.  Marland Kitchen PERCUTANEOUS CORONARY STENT INTERVENTION (PCI-S)  September 2004   PCI - RCA 2 overlapping Taxus DES 2.75 mm x 32 mm and 2.75 mm x 12 mm (3.0 mm); PCI-Cx-OM1 - Taxus DES 3.0 mm x 20 mm (3.1 mm);   Marland Kitchen PERCUTANEOUS CORONARY STENT INTERVENTION (PCI-S)  12/2005   80% ISR in proximal Taxus stent in RCA -- covered proximally with Cypher DES 3.0 mm x 12 mm  . PERCUTANEOUS CORONARY STENT INTERVENTION (PCI-S) N/A 05/17/2014   Procedure: PERCUTANEOUS CORONARY STENT INTERVENTION (PCI-S);  Surgeon: Sinclair Grooms, MD;  Location: [facility] CATH LAB: PCI pRCA stent ISR - Promus DES 3.0 x 8 (3.25), OM distal stent edge - Promus DES 2.75 x 12 (3.1)  . PERIPHERAL VASCULAR CATHETERIZATION  11/03/2016   Procedure: Embolization;  Surgeon: Waynetta Sandy, MD;  Location: [place] CV LAB;  Service:  Cardiovascular;;  . POSTERIOR CERVICAL FUSION/FORAMINOTOMY N/A 05/02/2013   Procedure: POSTERIOR CERVICAL FUSION/FORAMINOTOMY CERVICAL SEVEN THORACIC-ONE;  Surgeon: Eustace Moore, MD;  Location: [place] [facility];  Service: Neurosurgery;  Laterality: N/A;  POSTERIOR CERVICAL FUSION/FORAMINOTOMY CERVICAL SEVEN THORACIC-ONE  . SPHINCTEROTOMY N/A 06/30/2015   Procedure: SPHINCTEROTOMY;  Surgeon: Ladene Artist, MD;  Location: [facility];  Service: Endoscopy;  Laterality: N/A;  .  TOTAL KNEE ARTHROPLASTY Right   . TOTAL KNEE ARTHROPLASTY Left 12/20/2016   Procedure: LEFT TOTAL KNEE ARTHROPLASTY;  Surgeon: Vickey Huger, MD;  Location: [place];  Service: Orthopedics;  Laterality: Left;  . TRANSTHORACIC ECHOCARDIOGRAM  07/2016   EF 45-50%. Inferolateral hypokinesis (correlates with scar noted on Myoview). GR 1 DD. Mild aortic stenosis. Mod LAE.  Marland Kitchen TRANSTHORACIC ECHOCARDIOGRAM  07/2018   Normal LV size.  EF 50 to 55% with mild HK of inferolateral wall.  GRII DD.  Mild AS with mean gradient 15 mm or greater.  Mild ascending aortic dilation.  Mildly increased PA pressures of 41 mmHg  . UPPER GI ENDOSCOPY  07/03/2019  . VISCERAL ANGIOGRAM  11/03/2016   Procedure: Visceral Angiogram;  Surgeon: Waynetta Sandy, MD;  Location: [place] CV LAB;  Service: Cardiovascular;;    Allergies: Oxycodone, Lisinopril, Statins, and Welchol [colesevelam hcl]  Medications: Prior to Admission medications   Medication Sig Start Date End Date Taking? Authorizing Provider  acetaminophen (TYLENOL) 500 MG tablet Take 1,000 mg by mouth every 6 (six) hours as needed for mild pain, moderate pain or headache. Reported on 03/16/2016    [provider]  albuterol (PROVENTIL HFA;VENTOLIN HFA) 108 (90 Base) MCG/ACT inhaler Inhale 2 puffs into the lungs every 6 (six) hours as needed for wheezing or shortness of breath. 02/13/18   Tysinger, Camelia Eng, PA-C  amLODipine (NORVASC) 5 MG tablet Take 1 tablet (5 mg total) by mouth  daily. 07/23/19   Leonie Man, MD  aspirin EC 81 MG EC tablet Take 1 tablet (81 mg total) by mouth daily. 04/23/17   Daune Perch, NP  Choline Fenofibrate (FENOFIBRIC ACID) 135 MG CPDR TAKE ONE CAPSULE BY MOUTH EVERY DAY 05/20/20   Leonie Man, MD  clopidogrel (PLAVIX) 75 MG tablet Take 1 tablet by mouth once daily 06/02/20   Leonie Man, MD  ezetimibe (ZETIA) 10 MG tablet Take 1 tablet (10 mg total) by mouth daily. 10/23/19   Leonie Man, MD  glucose blood [facility] VERIO) test strip USE ONE STRIP TO CHECK GLUCOSE TWICE DAILY E11.9 06/15/17   Denita Lung, MD  HYDROcodone-acetaminophen (NORCO) 5-325 MG tablet Take 1 tablet by mouth every 6 (six) hours as needed for moderate pain. 11/16/19   Mcarthur Rossetti, MD  Iron-Folic Acid-Vit J81 (IRON FORMULA PO) Take 65 mg by mouth 1 day or 1 dose.    [provider]  losartan (COZAAR) 100 MG tablet Take 1 tablet (100 mg total) by mouth daily. 09/20/19   Leonie Man, MD  metFORMIN (GLUCOPHAGE) 500 MG tablet Take 1 tablet by mouth once daily with breakfast 12/07/19   Denita Lung, MD  Multiple Vitamins-Minerals (MULTIVITAMIN WITH MINERALS) tablet Take 1 tablet by mouth daily.     [provider]  pantoprazole (PROTONIX) 40 MG tablet Take 1 tablet by mouth once daily 09/18/19   Leonie Man, MD  ranolazine (RANEXA) 500 MG 12 hr tablet Take 1 tablet by mouth twice daily 01/22/20   Leonie Man, MD  sildenafil (REVATIO) 20 MG tablet TAKE 2-5 TABLETS BY MOUTH AS NEEDED FOR SEXUAL ACTIVITY 11/28/17   Denita Lung, MD  sildenafil (VIAGRA) 100 MG tablet Take 1 tablet (100 mg total) by mouth daily as needed for erectile dysfunction. 12/11/19   Denita Lung, MD  valsartan (DIOVAN) 320 MG tablet Take 1 tablet (320 mg total) by mouth daily. 05/29/20   Leonie Man, MD  Family History  Problem Relation Age of Onset  . Arthritis Mother   . Diabetes Father   . Heart disease Father   . Stroke Sister     . Hypertension Sister   . Heart disease Sister   . Diabetes Sister   . Breast cancer Sister   . Heart disease Brother   . Ulcers Brother   . Colon cancer Neg Hx     Social History   Socioeconomic History  . Marital status: Married    Spouse name: Not on file  . Number of children: Not on file  . Years of education: Not on file  . Highest education level: Not on file  Occupational History  . Not on file  Tobacco Use  . Smoking status: Former Smoker    Packs/day: 3.00    Years: 45.00    Pack years: 135.00    Types: Cigarettes    Quit date: 10/05/1983    Years since quitting: 36.7  . Smokeless tobacco: Never Used  Vaping Use  . Vaping Use: Never used  Substance and Sexual Activity  . Alcohol use: Yes    Alcohol/week: 4.0 standard drinks    Types: 4 Glasses of wine per week    Comment:  4 oz. wine daily  . Drug use: No  . Sexual activity: Not Currently  Other Topics Concern  . Not on file  Social History Narrative   He is married, father of two, grandfather to 28, great grandfather to two.    Not really getting much exercise now, do to his significant back and hip pain.    He does not smoke and only has an alcoholic beverage.    Social Determinants of Health   Financial Resource Strain:   . Difficulty of Paying Living Expenses: Not on file  Food Insecurity:   . Worried About Charity fundraiser in the Last Year: Not on file  . Ran Out of Food in the Last Year: Not on file  Transportation Needs:   . Lack of Transportation (Medical): Not on file  . Lack of Transportation (Non-Medical): Not on file  Physical Activity:   . Days of Exercise per Week: Not on file  . Minutes of Exercise per Session: Not on file  Stress:   . Feeling of Stress : Not on file  Social Connections:   . Frequency of Communication with Friends and Family: Not on file  . Frequency of Social Gatherings with Friends and Family: Not on file  . Attends Religious Services: Not on file  .  Active Member of Clubs or Organizations: Not on file  . Attends Archivist Meetings: Not on file  . Marital Status: Not on file       Review of Systems  Review of Systems: A 12 point ROS discussed and pertinent positives are indicated in the HPI above.  All other systems are negative.  Physical Exam No direct physical exam was performed (except for noted visual exam findings with Video Visits).    Vital Signs: There were no vitals taken for this visit.  Imaging: CT Angio Abd/Pel w/ and/or w/o  Result Date: 06/07/2020 CLINICAL DATA:  Post endovascular repair of infrarenal abdominal aortic aneurysm with history of endoleak post endovascular repair on 01/18/2017. EXAM: CTA ABDOMEN AND PELVIS WITHOUT AND WITH CONTRAST TECHNIQUE: Multidetector CT imaging of the abdomen and pelvis was performed using the standard protocol during bolus administration of intravenous contrast. Multiplanar reconstructed images and MIPs were obtained  and reviewed to evaluate the vascular anatomy. CONTRAST:  25mL OMNIPAQUE IOHEXOL 350 MG/ML SOLN COMPARISON:  CT abdomen pelvis-06/13/2019; 05/24/2018; 03/22/2027; 12/17/2016; 09/02/2015 FINDINGS: VASCULAR Aorta: Post endovascular repair of infrarenal abdominal aortic aneurysm as well as endoleak repair with coil embolization of the IMA and right L4 lumbar arteries. The proximal end of the stent graft remains well apposed against the walls of the infrarenal abdominal aorta while the distal limbs remain apposed against the walls of the bilateral common iliac arteries. The stent graft remains widely patent without evidence in stent stenosis or mural thrombus. Unfortunately, there is persistent ill-defined opacification of the native abdominal aortic aneurysm sac (best appreciated on delayed axial images 28 and 29, series 14), with associated continued slight expansion of the native abdominal aortic aneurysm sac, presently measuring 5.2 x 5.4 x 6.0 as measured in greatest  oblique short axis axial (image 29, series 14), coronal (coronal image 64, series 15), and sagittal (sagittal image 10, series 16), dimensions respectively, previously, 4.5 x 5.0 x 5.2 cm (when compared to the 2018 examination). No contrast extravasation or periaortic stranding. The etiology of this continued endoleak is indeterminate though presumably a type 2 endoleak via a lumbar artery versus a type 3 endoleak. Celiac: There is a minimal amount of eccentric calcified atherosclerotic plaque involving the cranial aspect of the origin the celiac artery, not resulting in a hemodynamically significant stenosis. Conventional branching pattern. SMA: There is a minimal amount of eccentric calcified atherosclerotic plaque involving the origin and main trunk of the SMA, not resulting in a hemodynamically significant stenosis. Conventional branching pattern. The distal tributaries of the SMA appear widely patent without discrete internal filling defect to suggest distal embolism. Renals: Duplicated bilaterally with tiny accessory renal arteries supplying the inferior poles of the bilateral kidneys, both of which appear thrombosis with associated geographic atrophy involving the inferior poles of the bilateral kidneys, right greater than left. There is a moderate amount of slightly irregular mixed calcified and noncalcified atherosclerotic plaque involving the origin of the dominant right renal artery with associated atrophy of the remainder of the right renal parenchyma. There is a minimal amount of eccentric predominantly calcified atherosclerotic plaque involving the undersurface of the solitary left renal artery, not resulting in hemodynamically significant stenosis. No vessel irregularity to suggest FMD. IMA: Embolized at its origin as a sequela of previous endoleak repair with early reconstitution via collateral supply from the SMA. Inflow: As above, the distal limbs of the endovascular stent graft appear well apposed  against the walls of the bilateral common iliac arteries. The bilateral internal iliac arteries are diseased though patent and of normal caliber. The bilateral external iliac arteries are tortuous but of normal caliber and widely patent without hemodynamically significant narrowing. Proximal Outflow: There is a minimal to moderate amount of slightly irregular calcified atherosclerotic plaque within the right common femoral artery, not resulting in hemodynamically significant stenosis. The left common femoral artery is mildly disease though without a hemodynamically significant stenosis. The imaged portions of the bilateral deep and superficial femoral arteries appear widely patent. Veins: The IVC and pelvic venous systems appear widely patent. Review of the MIP images confirms the above findings. _________________________________________________________ NON-VASCULAR Lower chest: Limited visualization of the lower thorax demonstrates minimal bibasilar subsegmental atelectasis. No discrete focal airspace opacities. No pleural effusion. Cardiomegaly. Post median sternotomy with calcifications within the native coronary arteries. Exuberant calcifications within the mitral valve annulus. No pericardial effusion. Hepatobiliary: Normal hepatic contour. No discrete hepatic lesions. Post cholecystectomy. No intra extrahepatic  bili duct dilatation. No ascites. Pancreas: Normal appearance of the pancreas. Spleen: Normal appearance of the spleen. Adrenals/Urinary Tract: There is symmetric enhancement of the bilateral kidneys. Redemonstrated geographic atrophy involving the inferior poles of the bilateral kidneys, right greater than left, as a sequela of occlusion of the tiny bilateral accessory renal arteries. Redemonstrated mild atrophy of the right kidney in comparison to the left potentially the sequela of a hemodynamically significant narrowing involving the origin of the dominant right renal artery. No evidence of  nephrolithiasis. There is a very minimal amount of likely age and body habitus related perinephric stranding. No urinary obstruction. Redemonstrated approximately 2.3 cm macroscopic fat containing left-sided adrenal myelolipoma (image 8, series 4), similar to the 05/2015 examination and thus of no clinical concern. Normal appearance of the right adrenal gland. There is mild thickening the urinary bladder wall, potentially accentuated due to underdistention. Stomach/Bowel: Large colonic stool burden without evidence of enteric obstruction. Scattered colonic diverticulosis without evidence superimposed acute diverticulitis. Normal appearance of the terminal ileum. No discrete areas of bowel wall thickening. Moderate to large-sized hiatal hernia. Previously questioned area of masslike thickening about the greater curvature of the stomach at the level of the patient's known hiatal hernia is less conspicuous on the present examination and additionally air is seen within this previously questioned area of thickening and thus favored to represent redundant gastric folding. No pneumoperitoneum, pneumatosis or portal venous gas. Lymphatic: No bulky retroperitoneal, mesenteric, pelvic or inguinal lymphadenopathy. Reproductive: Mild prostatomegaly with internal dystrophic calcifications and minimal mass effect on the undersurface of the urinary bladder. No free fluid within pelvic cul-de-sac. Other: Minimal amount of subcutaneous edema about the midline of the low back. Musculoskeletal: No acute or aggressive osseous abnormalities. Post L3-L5 paraspinal fusion intervertebral disc space replacement. Stigmata of dish within the thoracic spine. There is at least partial ankylosis involving the L5-S1 articulation. Moderate to severe DDD of L2-L3 with disc space height loss and small posteriorly directed disc osteophyte complex at this location. Mild-to-moderate degenerative change the bilateral hips with joint space loss,  subchondral sclerosis and osteophytosis, right greater than left. IMPRESSION: VASCULAR 1. Post endovascular repair of infrarenal abdominal aortic aneurysm and previous endoleak repair (coil embolization of the IMA and right L4 lumbar arteries) with findings of a persistent endoleak of uncertain etiology (potentially a type 2 endoleak due to lumbar arteries versus a type 3 endoleak) with continued enlargement of the native abdominal aortic aneurysm sac, currently measuring 6.0 in maximal diameter, previously, 5.2 cm when compared to the 2018 examination. No contrast extravasation or perivascular stranding. Aortic aneurysm NOS (ICD10-I71.9). 2. 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 3. Potential hemodynamically significant narrowing involving the dominant right renal artery with associated mild atrophy of the right kidney in comparison to the left. 4.  Aortic Atherosclerosis (ICD10-I70.0). NON-VASCULAR 1. Colonic diverticulosis without superimposed acute diverticulitis. 2. Previously questioned area of gastric wall thickening associated with patient's hiatal hernia is now favored to represent redundant gastric mucosa and without discrete lesion. Electronically Signed   By: Sandi Mariscal M.D.   On: 06/07/2020 11:23    Labs:  CBC: Recent Labs    11/15/19 1242  WBC 5.2  HGB 12.0*  HCT 37.2*  PLT 195    COAGS: No results for input(s): INR, APTT in the last 8760 hours.  BMP: Recent Labs    06/13/19 1351 11/15/19 1242 06/04/20 1000  NA  --  138  --   K  --  4.7  --   CL  --  109  --   CO2  --  23  --   GLUCOSE  --  114*  --   BUN  --  25*  --   CALCIUM  --  9.0  --   CREATININE 1.60* 1.65* 1.70*  GFRNONAA  --  38*  --   GFRAA  --  44*  --     LIVER FUNCTION TESTS: No results for input(s): BILITOT, AST, ALT, ALKPHOS, PROT, ALBUMIN in the last 8760 hours.  TUMOR  MARKERS: No results for input(s): AFPTM, CEA, CA199, CHROMGRNA in the last 8760 hours.  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    He has had very little change in the size of the excluded aneurysm sac, perhaps slightly larger over the past 2 CT studies.    I had another discussion with him regarding the reason for treatment of AAA, the known rate of increased intervention after endovascular repair, and the need for surveillance in these such cases of endoleak.   We again discussed the size of the excluded sac is not very much changed from its maximum diameter, perhaps as large as 12mm change over time, which may be a real enlargement or measurement error/technique error.  He remains asymptomatic and has no concerning features on the CT otherwise.    I reinforced the fact that even a native AAA of 5.0cm to 5.9cm has an annual rupture risk of only 35-57% range, and certainly having been repaired he is at very low risk of problem, supported by [place] data, certainly less than 2% risk.   My impression is that observing him for any interval growth is reasonable, with which he agrees.    If he has any new abdominal pain or back pain, or any other concerning symptoms, he knows that he should come to [facility] ED for care.   Plan: - Follow up office visit in 1 year, with repeat CT scan for surveillance. This can be non-contrast CT scan of abd/pelvis.  - I have advised him to observe his other follow up appointments     Electronically Signed: Corrie Mckusick 06/11/2020, 1:30 PM   I spent a total of    25 Minutes in remote  clinical consultation, greater than 50% of which was counseling/coordinating care for endoleak, type II, surveillance.    Visit type: Audio only (telephone). Audio (no video) only due to patient's lack of internet/smartphone  capability. Alternative for in-person consultation at [facility], [address], [place], [place]. This visit type was conducted due to national recommendations for restrictions regarding the COVID-19 Pandemic (e.g. social distancing).  This format is felt to be most appropriate for this patient at this time.  All issues noted in this document were discussed and addressed.

## 2020-06-12 ENCOUNTER — Other Ambulatory Visit: Payer: Self-pay

## 2020-06-12 ENCOUNTER — Encounter: Payer: Self-pay | Admitting: Family Medicine

## 2020-06-12 ENCOUNTER — Ambulatory Visit (INDEPENDENT_AMBULATORY_CARE_PROVIDER_SITE_OTHER): Payer: Medicare Other | Admitting: Family Medicine

## 2020-06-12 VITALS — BP 146/92 | HR 72 | Temp 97.6°F | Wt 214.0 lb

## 2020-06-12 DIAGNOSIS — E118 Type 2 diabetes mellitus with unspecified complications: Secondary | ICD-10-CM

## 2020-06-12 DIAGNOSIS — K219 Gastro-esophageal reflux disease without esophagitis: Secondary | ICD-10-CM

## 2020-06-12 DIAGNOSIS — I25119 Atherosclerotic heart disease of native coronary artery with unspecified angina pectoris: Secondary | ICD-10-CM

## 2020-06-12 DIAGNOSIS — E785 Hyperlipidemia, unspecified: Secondary | ICD-10-CM

## 2020-06-12 DIAGNOSIS — N529 Male erectile dysfunction, unspecified: Secondary | ICD-10-CM | POA: Diagnosis not present

## 2020-06-12 DIAGNOSIS — I5032 Chronic diastolic (congestive) heart failure: Secondary | ICD-10-CM | POA: Diagnosis not present

## 2020-06-12 DIAGNOSIS — Z23 Encounter for immunization: Secondary | ICD-10-CM

## 2020-06-12 DIAGNOSIS — J309 Allergic rhinitis, unspecified: Secondary | ICD-10-CM | POA: Diagnosis not present

## 2020-06-12 DIAGNOSIS — I110 Hypertensive heart disease with heart failure: Secondary | ICD-10-CM

## 2020-06-12 LAB — POCT GLYCOSYLATED HEMOGLOBIN (HGB A1C): Hemoglobin A1C: 5.8 % — AB (ref 4.0–5.6)

## 2020-06-12 NOTE — Progress Notes (Signed)
   Subjective:    Patient ID: [name], male    DOB: 01/25/1936, 84 y.o.   MRN: [id number]  HPI He is here for follow-up on his underlying diabetes.  Presently he is not on Metformin due to a previous A1c of 5.8.  He is doing well on his present medication regimen Including Diovan, Ranexa, losartan.  He is also taking Zetia and having no difficulty with that.  He continues on Ranexa.  He does see cardiology regularly.  He has had some difficulty recently with postnasal drainage but does have an underlying history of allergies.  He does complain of some rhinorrhea.  Otherwise things seem to be going fairly well for him.  Review of Systems     Objective:   Physical Exam Alert and in no distress.  Hemoglobin A1c is 5.8 Medical record including health maintenance, immunizations social and family history was reviewed.     Assessment & Plan:  Type 2 diabetes mellitus with complication, without long-term current use of insulin (HCC)  Erectile dysfunction, unspecified erectile dysfunction type  Hyperlipidemia LDL goal <70; statin intolerant  CAD S/P CABG '95- several PCis since, last 05/17/14  Gastroesophageal reflux disease, unspecified whether esophagitis present  Allergic rhinitis, unspecified seasonality, unspecified trigger  Hypertensive heart disease with chronic diastolic congestive heart failure ([place])  Type II diabetes mellitus with complication -- CAD, AAA  He will continue on his present medication regimen.  Follow-up with cardiology as scheduled.  Did recommend that he try Claritin to see if that will help with his postnasal drainage.  If he has further difficulty with call me.

## 2020-06-18 ENCOUNTER — Other Ambulatory Visit: Payer: Self-pay | Admitting: Cardiology

## 2020-06-25 ENCOUNTER — Encounter: Payer: Self-pay | Admitting: Family Medicine

## 2020-07-07 DIAGNOSIS — L578 Other skin changes due to chronic exposure to nonionizing radiation: Secondary | ICD-10-CM | POA: Diagnosis not present

## 2020-07-07 DIAGNOSIS — L308 Other specified dermatitis: Secondary | ICD-10-CM | POA: Diagnosis not present

## 2020-07-07 DIAGNOSIS — L570 Actinic keratosis: Secondary | ICD-10-CM | POA: Diagnosis not present

## 2020-07-23 ENCOUNTER — Other Ambulatory Visit: Payer: Self-pay | Admitting: Cardiology

## 2020-08-25 ENCOUNTER — Telehealth: Payer: Self-pay | Admitting: Cardiology

## 2020-08-25 NOTE — Telephone Encounter (Signed)
Skip valsartan for 1 day, then resume at 1/2 tablet daily with food and see if diarrhea improves.   If diarrhea is not resolved, after dose decrease, please call back ti change to another medication.

## 2020-08-25 NOTE — Telephone Encounter (Signed)
Spoke with the pt's wife (okay per DPR). She states that her husband was ordered  valsartan 320 mg once daily at his last office visit with Dr. Ellyn Hack 05/29/20, but was instructed to finish his supply of losartan prior to initiating it. Since starting Valsartan 2 weeks ago, the pt is experiencing diarrhea 5 or 6 times per day. He has tried Pepto bismol and imodium without relief. Wife states this change in medication is the only change to the pt's routine. Denies changes in diet or exposures to any sick people. Wife states that pt is trying to stay hydrated during this time.  Will reach out to PharmD for review of the pt's medications.  Advised pt's wife that pt may also need to reach out to his PCP for management of this issue. Wife verbalizes understanding.

## 2020-08-25 NOTE — Telephone Encounter (Signed)
Per pharmacist, advised pt's wife (okay per DPR) to hold Valsartan for 1 day, then resume with a half a tablet daily with food and see if diarrhea improves. If diarrhea is not better within a couple of days, the pt is to contact our office again for potential medication change. The pt's wife verbalizes understanding.

## 2020-08-25 NOTE — Telephone Encounter (Signed)
Pt c/o medication issue:  1. Name of Medication:  valsartan (DIOVAN) 320 MG tablet 2. How are you currently taking this medication (dosage and times per day)?  As directed   3. Are you having a reaction (difficulty breathing--STAT)?  Diarrhea   4. What is your medication issue?  Patient used to be on losartan (COZAAR) 100 MG tablet and was recently changed to Valsartan - patient's wife called regarding this issue. They believe that the diarrhea has to be caused by the new medicine as it's the only change. Please call/advise.   Thank you!

## 2020-09-01 ENCOUNTER — Telehealth: Payer: Self-pay | Admitting: Family Medicine

## 2020-09-01 NOTE — Telephone Encounter (Signed)
Pt was advised [name] 

## 2020-09-01 NOTE — Telephone Encounter (Signed)
Pts wife called and said the cardiologist changed pts meds from Losartan to Valsartan and he has had diarrhea every time he eats. She wants to know if you need to see him or do they call the cardiologist

## 2020-09-01 NOTE — Telephone Encounter (Signed)
Let her know that I do not think it is the blood pressure medication.  Lets see what time will do to stop that.  If continued difficulty, have him call me.

## 2020-09-18 DIAGNOSIS — Z23 Encounter for immunization: Secondary | ICD-10-CM | POA: Diagnosis not present

## 2020-09-22 ENCOUNTER — Other Ambulatory Visit: Payer: Self-pay | Admitting: Cardiology

## 2020-10-14 ENCOUNTER — Telehealth: Payer: Self-pay | Admitting: Family Medicine

## 2020-10-14 NOTE — Telephone Encounter (Signed)
Pt came in and dropped off a disability parking placard to be completed. Send back. Please call pt at [phone] when ready.

## 2020-10-15 ENCOUNTER — Telehealth: Payer: Self-pay

## 2020-10-15 NOTE — Telephone Encounter (Signed)
Called pt to advise that his application for parking is ready for pick up. [place]

## 2020-10-22 ENCOUNTER — Other Ambulatory Visit: Payer: Self-pay

## 2020-10-22 ENCOUNTER — Ambulatory Visit (INDEPENDENT_AMBULATORY_CARE_PROVIDER_SITE_OTHER): Payer: Medicare Other | Admitting: Family Medicine

## 2020-10-22 ENCOUNTER — Other Ambulatory Visit: Payer: Self-pay | Admitting: Cardiology

## 2020-10-22 ENCOUNTER — Encounter: Payer: Self-pay | Admitting: Family Medicine

## 2020-10-22 VITALS — BP 170/92 | HR 70 | Temp 97.4°F | Ht 70.0 in | Wt 211.0 lb

## 2020-10-22 DIAGNOSIS — E785 Hyperlipidemia, unspecified: Secondary | ICD-10-CM

## 2020-10-22 DIAGNOSIS — I25119 Atherosclerotic heart disease of native coronary artery with unspecified angina pectoris: Secondary | ICD-10-CM

## 2020-10-22 DIAGNOSIS — E119 Type 2 diabetes mellitus without complications: Secondary | ICD-10-CM | POA: Diagnosis not present

## 2020-10-22 DIAGNOSIS — I110 Hypertensive heart disease with heart failure: Secondary | ICD-10-CM

## 2020-10-22 DIAGNOSIS — Z96653 Presence of artificial knee joint, bilateral: Secondary | ICD-10-CM

## 2020-10-22 DIAGNOSIS — I700 Atherosclerosis of aorta: Secondary | ICD-10-CM

## 2020-10-22 DIAGNOSIS — G5601 Carpal tunnel syndrome, right upper limb: Secondary | ICD-10-CM

## 2020-10-22 DIAGNOSIS — N2889 Other specified disorders of kidney and ureter: Secondary | ICD-10-CM

## 2020-10-22 DIAGNOSIS — G5602 Carpal tunnel syndrome, left upper limb: Secondary | ICD-10-CM

## 2020-10-22 DIAGNOSIS — E11A Type 2 diabetes mellitus without complications in remission: Secondary | ICD-10-CM

## 2020-10-22 DIAGNOSIS — G8929 Other chronic pain: Secondary | ICD-10-CM

## 2020-10-22 DIAGNOSIS — I5032 Chronic diastolic (congestive) heart failure: Secondary | ICD-10-CM

## 2020-10-22 DIAGNOSIS — K219 Gastro-esophageal reflux disease without esophagitis: Secondary | ICD-10-CM

## 2020-10-22 DIAGNOSIS — M25562 Pain in left knee: Secondary | ICD-10-CM

## 2020-10-22 DIAGNOSIS — D692 Other nonthrombocytopenic purpura: Secondary | ICD-10-CM

## 2020-10-22 DIAGNOSIS — N1832 Chronic kidney disease, stage 3b: Secondary | ICD-10-CM

## 2020-10-22 DIAGNOSIS — J309 Allergic rhinitis, unspecified: Secondary | ICD-10-CM

## 2020-10-22 DIAGNOSIS — E669 Obesity, unspecified: Secondary | ICD-10-CM

## 2020-10-22 DIAGNOSIS — I350 Nonrheumatic aortic (valve) stenosis: Secondary | ICD-10-CM

## 2020-10-22 MED ORDER — VALSARTAN-HYDROCHLOROTHIAZIDE 320-12.5 MG PO TABS: 1.0000 | ORAL_TABLET | Freq: Every day | ORAL | 1 refills | Status: DC

## 2020-10-22 NOTE — Progress Notes (Signed)
[name] is a 85 y.o. male who presents for annual wellness visit and follow-up on chronic medical conditions.  He has no particular concerns or complaints.  He has had no difficulty with chest pain, PND or DOE.  He continues on his blood pressure medication as.  He is also taking Ranexa as well as Plavix.  Continues on fenofibrate as well as Zetia.  He has a previous history of diabetes however his hemoglobin A1c is over the last couple of years have all been below 6.  He continues have difficulty with CTS symptoms and did have surgery on his right hand but is not interested in having it done again on his left.  His allergies seem to be under good control.  He does not complain of any reflux symptoms at the present time.  He does continue to have difficulty with arthritic type symptoms especially in the knee.   Immunizations and Health Maintenance Immunization History  Administered Date(s) Administered  . DTaP 02/19/2000  . Fluad Quad(high Dose 65+) 06/12/2020  . Influenza Split 08/03/2002, 09/22/2006, 07/09/2008, 07/26/2012  . Influenza Whole 07/15/2009  . Influenza, High Dose Seasonal PF 08/17/2013, 05/29/2015, 10/19/2016, 07/20/2017, 07/20/2018, 06/19/2019  . PFIZER(Purple Top)SARS-COV-2 Vaccination 11/09/2019, 12/04/2019  . Pneumococcal Conjugate-13 03/01/2017  . Pneumococcal Polysaccharide-23 01/19/2007, 06/04/2014  . Tdap 01/19/2007   Health Maintenance Due  Topic Date Due  . TETANUS/TDAP  01/18/2017  . COLONOSCOPY (Pts 45-63yrs Insurance coverage will need to be confirmed)  09/21/2017  . FOOT EXAM  07/20/2018  . COVID-19 Vaccine (3 - Booster for Pfizer series) 06/05/2020    Last colonoscopy: 09/21/12,  Last PSA: Unknown Dentist:Q six months  Ophtho: Q year Exercise: N/A   Other doctors caring for patient include: Dr. Ellyn [name] cardio, Dr. Ninfa [name] ortho, Dr. Fuller [name] GI, Dr. Herbert [name] eye  Advanced Directives: Does Patient Have a Medical Advance Directive?: Yes Type of  Advance Directive: Living will Does patient want to make changes to medical advance directive?: No - Patient declined  Depression screen:  See questionnaire below.     Depression screen [facility] 2/9 10/22/2020 06/12/2020 07/20/2018 03/01/2017 10/07/2015  Decreased Interest 0 0 0 0 0  Down, Depressed, Hopeless 0 0 0 0 0  PHQ - 2 Score 0 0 0 0 0  Some recent data might be hidden    Fall Screen: See Questionaire below.   Fall Risk  10/22/2020 08/29/2019 07/20/2018 03/01/2017 09/10/2016  Falls in the past year? 1 1 No No No  Comment - [facility]i Telephone Survey: data to providers prior to load - - Emmi Telephone Survey: data to providers prior to load  Number falls in past yr: 1 1 - - -  Comment - Emmi Telephone Survey Actual Response = 1 - - -  Injury with Fall? 0 0 - - -  Risk for fall due to : Impaired balance/gait - - - -    ADL screen:  See questionnaire below.  Functional Status Survey: Is the patient deaf or have difficulty hearing?: Yes Does the patient have difficulty seeing, even when wearing glasses/contacts?: No Does the patient have difficulty concentrating, remembering, or making decisions?: Yes (remembering) Does the patient have difficulty walking or climbing stairs?: Yes (SOB) Does the patient have difficulty dressing or bathing?: No Does the patient have difficulty doing errands alone such as visiting a doctor's office or shopping?: No   Review of Systems  Constitutional: -, -unexpected weight change, -anorexia, -fatigue Allergy: -sneezing, -itching, -congestion Dermatology: denies changing moles, rash, lumps  ENT: -runny nose, -ear pain, -sore throat,  Cardiology:  -chest pain, -palpitations, -orthopnea, Respiratory: -cough, -shortness of breath, -dyspnea on exertion, -wheezing,  Gastroenterology: -abdominal pain, -nausea, -vomiting, -diarrhea, -constipation, -dysphagia Hematology: -bleeding or bruising problems Musculoskeletal: -arthralgias, -myalgias, -joint swelling, -back  pain, - Ophthalmology: -vision changes,  Urology: -dysuria, -difficulty urinating,  -urinary frequency, -urgency, incontinence Neurology: -, -numbness, , -memory loss, -falls, -dizziness    PHYSICAL EXAM:  Eneral Appearance: Alert, cooperative, no distress, appears stated age Head: Normocephalic, without obvious abnormality, atraumatic Eyes: PERRL, conjunctiva/corneas clear, EOM's intact, fundi benign Ears: Normal TM's and external ear canals Nose: Nares normal, mucosa normal, no drainage or sinus   tenderness Throat: Lips, mucosa, and tongue normal; teeth and gums normal Neck: Supple, no lymphadenopathy, thyroid:no enlargement/tenderness/nodules; no carotid bruit or JVD Lungs: Clear to auscultation bilaterally without wheezes, rales or ronchi; respirations unlabored Heart: Regular rate and rhythm, S1 and S2 normal, no murmur, rub or gallop Abdomen: Soft, non-tender, nondistended, normoactive bowel sounds, no masses, no hepatosplenomegaly Extremities: No clubbing, cyanosis or edema Pulses: 2+ and symmetric all extremities Skin: Purpuric lesions noted on the arms. Lymph nodes: Cervical, supraclavicular, and axillary nodes normal Neurologic: CNII-XII intact, normal strength, sensation and gait; reflexes 2+ and symmetric throughout   Psych: Normal mood, affect, hygiene and grooming  ASSESSMENT/[name]: Hyperlipidemia LDL goal <70; statin intolerant - [name]: Lipid panel  Type 2 diabetes mellitus in remission ([name]) - [name]: CBC with Differential/Platelet, Comprehensive metabolic panel  CAD S/P CABG '95- several PCis since, last 05/17/14 - [name]: CBC with Differential/Platelet, Comprehensive metabolic panel, Lipid panel  Hypertensive heart disease with chronic diastolic congestive heart failure ([name]) - [name]: valsartan-hydrochlorothiazide (DIOVAN HCT) 320-12.5 MG tablet  Obesity (BMI 30-39.9)  Chronic renal impairment, stage 3b ([name]) - [name]: Comprehensive metabolic panel  Allergic rhinitis,  unspecified seasonality, unspecified trigger  Senile purpura ([name])  Status post total bilateral knee replacement  Gastroesophageal reflux disease, unspecified whether esophagitis present  Mild aortic stenosis by prior echocardiogram  Chronic pain of left knee  Carpal tunnel syndrome, right upper limb  Carpal tunnel syndrome, left upper limb  Aortic atherosclerosis ([place]) He will continue on his present medication regimen, follow-up with cardiology and orthopedics on an as-needed basis. The hemoglobin A1c is on him over the last couple years of voice been quite good and therefore he was relabeled as diabetes in remission.    . Immunization recommendations discussed.   Medicare Attestation I have personally reviewed: The patient's medical and social history Their use of alcohol, tobacco or illicit drugs Their current medications and supplements The patient's functional ability including ADLs,fall risks, home safety risks, cognitive, and hearing and visual impairment Diet and physical activities Evidence for depression or mood disorders  The patient's weight, height, and BMI have been recorded in the chart.  I have made referrals, counseling, and provided education to the patient based on review of the above and I have provided the patient with a written personalized care [name] for preventive services.     Jill Alexanders, MD   10/22/2020

## 2020-10-22 NOTE — Patient Instructions (Addendum)
  Mr. Zelek , Thank you for taking time to come for your Medicare Wellness Visit. I appreciate your ongoing commitment to your health goals. Please review the following plan we discussed and let me know if I can assist you in the future.   These are the goals we discussed: Throw away the Diovan and start taking the new Diovan HCT and return here in 1 month.  Bring in all your medications. This is a list of the screening recommended for you and due dates:  Health Maintenance  Topic Date Due  . Tetanus Vaccine  01/18/2017  . Colon Cancer Screening  09/21/2017  . Complete foot exam   07/20/2018  . COVID-19 Vaccine (3 - Booster for Pfizer series) 06/05/2020  . Hemoglobin A1C  12/10/2020  . Eye exam for diabetics  06/05/2021  . Flu Shot  Completed  . Pneumonia vaccines  Completed

## 2020-10-23 LAB — COMPREHENSIVE METABOLIC PANEL
ALT: 19 IU/L (ref 0–44)
AST: 24 IU/L (ref 0–40)
Albumin/Globulin Ratio: 1.7 (ref 1.2–2.2)
Albumin: 4.3 g/dL (ref 3.6–4.6)
Alkaline Phosphatase: 41 IU/L — ABNORMAL LOW (ref 44–121)
BUN/Creatinine Ratio: 11 (ref 10–24)
BUN: 17 mg/dL (ref 8–27)
Bilirubin Total: 0.4 mg/dL (ref 0.0–1.2)
CO2: 22 mmol/L (ref 20–29)
Calcium: 9.5 mg/dL (ref 8.6–10.2)
Chloride: 102 mmol/L (ref 96–106)
Creatinine, Ser: 1.54 mg/dL — ABNORMAL HIGH (ref 0.76–1.27)
GFR calc Af Amer: 48 mL/min/{1.73_m2} — ABNORMAL LOW (ref >59)
GFR calc non Af Amer: 41 mL/min/{1.73_m2} — ABNORMAL LOW (ref >59)
Globulin, Total: 2.5 g/dL (ref 1.5–4.5)
Glucose: 112 mg/dL — ABNORMAL HIGH (ref 65–99)
Potassium: 5.2 mmol/L (ref 3.5–5.2)
Sodium: 138 mmol/L (ref 134–144)
Total Protein: 6.8 g/dL (ref 6.0–8.5)

## 2020-10-23 LAB — CBC WITH DIFFERENTIAL/PLATELET
Basophils Absolute: 0 10*3/uL (ref 0.0–0.2)
Basos: 1 %
EOS (ABSOLUTE): 0.1 10*3/uL (ref 0.0–0.4)
Eos: 1 %
Hematocrit: 39.7 % (ref 37.5–51.0)
Hemoglobin: 13.4 g/dL (ref 13.0–17.7)
Immature Grans (Abs): 0 10*3/uL (ref 0.0–0.1)
Immature Granulocytes: 0 %
Lymphocytes Absolute: 1.4 10*3/uL (ref 0.7–3.1)
Lymphs: 22 %
MCH: 31.9 pg (ref 26.6–33.0)
MCHC: 33.8 g/dL (ref 31.5–35.7)
MCV: 95 fL (ref 79–97)
Monocytes Absolute: 0.4 10*3/uL (ref 0.1–0.9)
Monocytes: 7 %
Neutrophils Absolute: 4.6 10*3/uL (ref 1.4–7.0)
Neutrophils: 69 %
Platelets: 195 10*3/uL (ref 150–450)
RBC: 4.2 x10E6/uL (ref 4.14–5.80)
RDW: 13.5 % (ref 11.6–15.4)
WBC: 6.5 10*3/uL (ref 3.4–10.8)

## 2020-10-23 LAB — LIPID PANEL
Chol/HDL Ratio: 3.6 ratio (ref 0.0–5.0)
Cholesterol, Total: 197 mg/dL (ref 100–199)
HDL: 55 mg/dL (ref >39)
LDL Chol Calc (NIH): 124 mg/dL — ABNORMAL HIGH (ref 0–99)
Triglycerides: 100 mg/dL (ref 0–149)
VLDL Cholesterol Cal: 18 mg/dL (ref 5–40)

## 2020-10-29 ENCOUNTER — Telehealth: Payer: Self-pay | Admitting: Family Medicine

## 2020-10-29 DIAGNOSIS — I110 Hypertensive heart disease with heart failure: Secondary | ICD-10-CM

## 2020-10-29 MED ORDER — VALSARTAN-HYDROCHLOROTHIAZIDE 320-12.5 MG PO TABS: 1.0000 | ORAL_TABLET | Freq: Every day | ORAL | 3 refills | Status: DC

## 2020-10-29 NOTE — Telephone Encounter (Signed)
Pharmacy and patient requesting 90 day supply of Diovan.

## 2020-11-24 ENCOUNTER — Encounter: Payer: Self-pay | Admitting: Family Medicine

## 2020-11-24 ENCOUNTER — Other Ambulatory Visit: Payer: Self-pay

## 2020-11-24 ENCOUNTER — Ambulatory Visit (INDEPENDENT_AMBULATORY_CARE_PROVIDER_SITE_OTHER): Payer: Medicare Other | Admitting: Family Medicine

## 2020-11-24 VITALS — BP 132/78 | HR 71 | Temp 97.9°F | Wt 212.4 lb

## 2020-11-24 DIAGNOSIS — I110 Hypertensive heart disease with heart failure: Secondary | ICD-10-CM | POA: Diagnosis not present

## 2020-11-24 DIAGNOSIS — I5032 Chronic diastolic (congestive) heart failure: Secondary | ICD-10-CM

## 2020-11-24 NOTE — Progress Notes (Signed)
   Subjective:    Patient ID: [name], male    DOB: [birth year], 85 y.o.   MRN: [id number]  HPI He is here for recheck on his blood pressure.  He is taking Diovan HCT as well as amlodipine and having no difficulty with that.  He does have a risk of blood pressure cuff that he brought with him.   Review of Systems     Objective:   Physical Exam Alert and in no distress.  Blood pressure is recorded.  His wrist cuff is not accurate.       Assessment & Plan:  Hypertensive heart disease with chronic diastolic congestive heart failure ([place]) His blood pressure on his present regimen is good.  Did recommend that he buy blood pressure cuff for the arm not the wrist and bring it in so we can measure it against ours.

## 2020-11-30 ENCOUNTER — Other Ambulatory Visit: Payer: Self-pay | Admitting: Cardiology

## 2020-12-01 ENCOUNTER — Other Ambulatory Visit: Payer: Self-pay | Admitting: Cardiology

## 2021-01-07 ENCOUNTER — Other Ambulatory Visit: Payer: Self-pay | Admitting: Family Medicine

## 2021-01-07 DIAGNOSIS — N529 Male erectile dysfunction, unspecified: Secondary | ICD-10-CM

## 2021-01-07 NOTE — Telephone Encounter (Signed)
[facility] is requesting to fill pt sildenafil. Please advise KH 

## 2021-01-26 ENCOUNTER — Other Ambulatory Visit: Payer: Self-pay | Admitting: Cardiology

## 2021-02-08 ENCOUNTER — Other Ambulatory Visit: Payer: Self-pay | Admitting: Cardiology

## 2021-02-11 ENCOUNTER — Telehealth: Payer: Self-pay | Admitting: Cardiology

## 2021-02-11 MED ORDER — EZETIMIBE 10 MG PO TABS: 10.0000 mg | ORAL_TABLET | Freq: Every day | ORAL | 1 refills | Status: DC

## 2021-02-11 NOTE — Telephone Encounter (Signed)
*  STAT* If patient is at the pharmacy, call can be transferred to refill team.   1. Which medications need to be refilled? (please list name of each medication and dose if known) ezetimibe (ZETIA) 10 MG tablet  2. Which pharmacy/location (including street and city if local pharmacy) is medication to be sent to? [id number] - [place] ([place]), [place] - [address]  3. Do they need a 30 day or 90 day supply? 90  

## 2021-02-11 NOTE — Telephone Encounter (Signed)
Called and spoke with patient's wife, Dot Zacharia (okay per DPR). I informed her that I sent the Rx for Zetia 10 mg daily to [facility] on [address]. She thanked me for calling and all (if any) questions were answered.

## 2021-04-01 ENCOUNTER — Encounter: Payer: Self-pay | Admitting: Family Medicine

## 2021-04-09 NOTE — Addendum Note (Signed)
Addended by: Leonie Man on: 04/09/2021 10:05 PM   Modules accepted: Orders

## 2021-04-29 ENCOUNTER — Other Ambulatory Visit: Payer: Self-pay | Admitting: Cardiology

## 2021-05-21 ENCOUNTER — Telehealth: Payer: Self-pay

## 2021-05-21 NOTE — Telephone Encounter (Signed)
Detailed instructions left on the patient's answering machine. Asked to call back with any questions. S.Salim Forero EMTP 

## 2021-05-26 ENCOUNTER — Other Ambulatory Visit: Payer: Self-pay

## 2021-05-26 ENCOUNTER — Ambulatory Visit (HOSPITAL_COMMUNITY): Payer: Medicare Other | Attending: Cardiovascular Disease

## 2021-05-26 DIAGNOSIS — Z955 Presence of coronary angioplasty implant and graft: Secondary | ICD-10-CM | POA: Diagnosis not present

## 2021-05-26 DIAGNOSIS — I110 Hypertensive heart disease with heart failure: Secondary | ICD-10-CM | POA: Insufficient documentation

## 2021-05-26 DIAGNOSIS — I25119 Atherosclerotic heart disease of native coronary artery with unspecified angina pectoris: Secondary | ICD-10-CM | POA: Diagnosis not present

## 2021-05-26 DIAGNOSIS — I5032 Chronic diastolic (congestive) heart failure: Secondary | ICD-10-CM | POA: Insufficient documentation

## 2021-05-26 HISTORY — PX: NM MYOVIEW LTD: HXRAD82

## 2021-05-26 LAB — MYOCARDIAL PERFUSION IMAGING
Base ST Depression (mm): 0 mm
LV dias vol: 173 mL (ref 62–150)
LV sys vol: 103 mL
MPHR: 137 {beats}/min
Nuc Stress EF: 40 %
Peak HR: 83 {beats}/min
Percent HR: 60.6 %
RPE: 0
Rest HR: 59 {beats}/min
Rest Nuclear Isotope Dose: 10.8 mCi
SDS: 2
SRS: 9
SSS: 11
ST Depression (mm): 0 mm
Stress Nuclear Isotope Dose: 32.4 mCi
TID: 1.05

## 2021-05-26 MED ORDER — TECHNETIUM TC 99M TETROFOSMIN IV KIT
10.8000 | PACK | Freq: Once | INTRAVENOUS | Status: AC | PRN
Start: 2021-05-26 — End: 2021-05-26
  Administered 2021-05-26: 10.8 via INTRAVENOUS
  Filled 2021-05-26: qty 11

## 2021-05-26 MED ORDER — TECHNETIUM TC 99M TETROFOSMIN IV KIT
32.4000 | PACK | Freq: Once | INTRAVENOUS | Status: AC | PRN
  Administered 2021-05-26: 32.4 via INTRAVENOUS
  Filled 2021-05-26: qty 33

## 2021-05-26 MED ORDER — REGADENOSON 0.4 MG/5ML IV SOLN
0.4000 mg | Freq: Once | INTRAVENOUS | Status: AC
  Administered 2021-05-26: 0.4 mg via INTRAVENOUS

## 2021-06-02 ENCOUNTER — Other Ambulatory Visit: Payer: Self-pay | Admitting: Interventional Radiology

## 2021-06-02 DIAGNOSIS — I714 Abdominal aortic aneurysm, without rupture, unspecified: Secondary | ICD-10-CM

## 2021-06-05 ENCOUNTER — Telehealth: Payer: Self-pay | Admitting: *Deleted

## 2021-06-05 DIAGNOSIS — I25119 Atherosclerotic heart disease of native coronary artery with unspecified angina pectoris: Secondary | ICD-10-CM

## 2021-06-05 DIAGNOSIS — I350 Nonrheumatic aortic (valve) stenosis: Secondary | ICD-10-CM

## 2021-06-05 DIAGNOSIS — I5032 Chronic diastolic (congestive) heart failure: Secondary | ICD-10-CM

## 2021-06-05 NOTE — Telephone Encounter (Signed)
-----   Message from Leonie Man, MD sent at 06/02/2021  2:28 PM EDT ----- Nuclear stress test is read as intermediate risk with similar findings to last study, but pump function appears to be slightly reduced.  Basically it is called intermediate risk because of the prior heart attack that was seen.  This was similar to what was seen in 2013 and echocardiogram back in 2017.  I would like to check a 2D echocardiogram just to reassess EF if pulm function is relatively stable, then I would not do any further evaluation.  We can discuss the remainder of the.     Glenetta Hew, MD

## 2021-06-05 NOTE — Telephone Encounter (Signed)
The patient has been notified of the result and verbalized understanding.  All questions (if any) were answered. Schedule  echo . Patient aware. Raiford Simmonds, RN 06/05/2021 4:30 PM

## 2021-06-09 ENCOUNTER — Other Ambulatory Visit: Payer: Self-pay | Admitting: Cardiology

## 2021-06-10 ENCOUNTER — Ambulatory Visit (HOSPITAL_COMMUNITY): Payer: Medicare Other | Attending: Cardiovascular Disease

## 2021-06-10 ENCOUNTER — Other Ambulatory Visit: Payer: Self-pay

## 2021-06-10 DIAGNOSIS — I350 Nonrheumatic aortic (valve) stenosis: Secondary | ICD-10-CM

## 2021-06-10 DIAGNOSIS — I25119 Atherosclerotic heart disease of native coronary artery with unspecified angina pectoris: Secondary | ICD-10-CM | POA: Diagnosis not present

## 2021-06-10 DIAGNOSIS — I5032 Chronic diastolic (congestive) heart failure: Secondary | ICD-10-CM | POA: Diagnosis not present

## 2021-06-10 HISTORY — PX: TRANSTHORACIC ECHOCARDIOGRAM: SHX275

## 2021-06-10 LAB — ECHOCARDIOGRAM COMPLETE
AR max vel: 1.69 cm2
AV Area VTI: 1.62 cm2
AV Area mean vel: 1.59 cm2
AV Mean grad: 11 mmHg
AV Peak grad: 19.9 mmHg
Ao pk vel: 2.23 m/s
Area-P 1/2: 3.17 cm2
S' Lateral: 5.2 cm

## 2021-06-12 ENCOUNTER — Other Ambulatory Visit: Payer: Self-pay | Admitting: Interventional Radiology

## 2021-06-12 DIAGNOSIS — I714 Abdominal aortic aneurysm, without rupture, unspecified: Secondary | ICD-10-CM

## 2021-06-22 ENCOUNTER — Other Ambulatory Visit: Payer: Self-pay | Admitting: Cardiology

## 2021-06-25 ENCOUNTER — Ambulatory Visit (HOSPITAL_COMMUNITY)
Admission: RE | Admit: 2021-06-25 | Discharge: 2021-06-25 | Disposition: A | Discharge status: Home / Self Care | Payer: Medicare Other | Source: Ambulatory Visit | Attending: Interventional Radiology | Admitting: Interventional Radiology

## 2021-06-25 DIAGNOSIS — I714 Abdominal aortic aneurysm, without rupture, unspecified: Secondary | ICD-10-CM

## 2021-06-26 ENCOUNTER — Encounter: Payer: Self-pay | Admitting: Cardiology

## 2021-06-26 ENCOUNTER — Other Ambulatory Visit: Payer: Self-pay

## 2021-06-26 ENCOUNTER — Ambulatory Visit: Payer: Medicare Other | Admitting: Cardiology

## 2021-06-26 DIAGNOSIS — E785 Hyperlipidemia, unspecified: Secondary | ICD-10-CM

## 2021-06-26 DIAGNOSIS — I25119 Atherosclerotic heart disease of native coronary artery with unspecified angina pectoris: Secondary | ICD-10-CM

## 2021-06-26 DIAGNOSIS — I350 Nonrheumatic aortic (valve) stenosis: Secondary | ICD-10-CM | POA: Diagnosis not present

## 2021-06-26 DIAGNOSIS — I110 Hypertensive heart disease with heart failure: Secondary | ICD-10-CM | POA: Diagnosis not present

## 2021-06-26 DIAGNOSIS — I5032 Chronic diastolic (congestive) heart failure: Secondary | ICD-10-CM

## 2021-06-26 DIAGNOSIS — G720 Drug-induced myopathy: Secondary | ICD-10-CM

## 2021-06-26 DIAGNOSIS — T466X5A Adverse effect of antihyperlipidemic and antiarteriosclerotic drugs, initial encounter: Secondary | ICD-10-CM

## 2021-06-26 DIAGNOSIS — Z951 Presence of aortocoronary bypass graft: Secondary | ICD-10-CM

## 2021-06-26 DIAGNOSIS — N1832 Chronic kidney disease, stage 3b: Secondary | ICD-10-CM

## 2021-06-26 DIAGNOSIS — Z8679 Personal history of other diseases of the circulatory system: Secondary | ICD-10-CM

## 2021-06-26 DIAGNOSIS — I2119 ST elevation (STEMI) myocardial infarction involving other coronary artery of inferior wall: Secondary | ICD-10-CM

## 2021-06-26 DIAGNOSIS — Z9889 Other specified postprocedural states: Secondary | ICD-10-CM

## 2021-06-26 NOTE — Progress Notes (Signed)
Primary Care Provider: Denita Lung, MD Cardiologist: Glenetta Hew, MD Electrophysiologist: None Vascular Surgeon: Dr. Earleen Newport  Clinic Note: Chief Complaint  Patient presents with   Follow-up    Annual.  No real changes to symptoms   Coronary Artery Disease    No angina, stable exertional dyspnea.  Myoview and echo results reviewed.    ===================================  ASSESSMENT/PLAN   Problem List Items Addressed This Visit       Cardiology Problems   CAD S/P CABG '95- several PCis since, last 05/17/14 (Chronic)    Significant CAD with patent LIMA to LAD and grafts in the RCA and LCx.  Myoview was checked prior to this visit because of his exertional dyspnea and fatigue.  Thankfully, there is no evidence of ischemia, only prior infarction.  Stable findings. He does not actually have anginal symptoms, just fatigue.  Beta-blockers in the longer being used because of fatigue.  Plan: Continue clopidogrel monotherapy for combination of CAD and PAD Okay to hold Plavix 5 days preop for surgeries or procedures.  7 days for more high risk procedures. Not on statin because of myopathies-is only willing to take Zetia and fenofibrate.  I discussed PCSK9 inhibitors in the past along with Nexletol.  Not interested, because of cost On combination of amlodipine and ranolazine for antianginal benefit.  No change for now. Continue ARB-HCTZ.      History of ST elevation myocardial infarction (STEMI) of inferolateral wall (PTCA - 100% large lateral OM) (Chronic)    Confirmed on recent Myoview.  Myoview underestimated EF, recheck on echocardiogram with stable with normal wall motion normality.  This time because of the reduced EF and sizable infarct, was considered intermediate risk.  No evidence of ischemia.  No significant active CHF symptoms.      Hypertensive heart disease with chronic diastolic congestive heart failure (HCC) (Chronic)    NYHA Class II symptoms mostly related to  exertional dyspnea which is as much related to his arthritis pains.  Plan: Continue valsartan-HCTZ and amlodipine.  Not beta-blocker because of fatigue. Not on full loop diuretic.      Hyperlipidemia LDL goal <70; statin intolerant (Chronic)    He is indicated that he does not want to take any morning Zetia and fenofibrate.  Last labs were checked in January with an LDL of 124.  Given his advanced age, he is chosen not to take any additional medications for lipids, acknowledging the risk.      Mild aortic stenosis by prior echocardiogram (Chronic)    Stable mild aortic stenosis on echocardiogram.  No need to be checked for another couple years.        Other   S/P CABG x 2: 1997. SVG-OM (known to be occluded), LIMA-LAD (Chronic)    Known patent LIMA-LAD with negative RCA and LCx stents.  No ischemia on Myoview.  Only known prior infarct of the inferior-inferolateral wall.  This is confirmed on Myoview in 2013 as well as now in 2022 wall motion abnormality was also noted and echocardiogram.      Chronic renal insufficiency, stage III (moderate) (HCC) (Chronic)    Followed closely by PCP.  Most recent creatinine was 1.54. Continue ARB      Statin myopathy (Chronic)    Intolerant of despite every statin be tried.  Not willing to take much more than combination of fenofibrate and ezetimibe.  We will try to get him set up for PCSK9 inhibitor, but not interested.  Status post endovascular aneurysm repair (EVAR) (Chronic)    Followed by Dr. Donzetta Matters.  Recent CT abdomen pelvis reviewed.  Have not seen what the plan is.       ===================================  HPI:    [name] is a 85 y.o. male with a longstanding history of CAD reviewed below along with AAA, PAD, HTN, HLD and DM-2.  He presents today for annual follow-up and to discuss results of stress test and echocardiogram were somewhat abnormal..  Long-standing history of CAD dating back to 1985: Inferior STEMI 1985:  Eventually led to two-vessel CABG (LIMA-LAD, SVG-OM) in [place] showed occlusion of the SVG-OM.  --> PCI in 2004 and 2007, 2015 and then most recently 2018. '04: Staged Taxus DES PCI to RCA (2 Taxus 2.75 x 32 & 12) and Cx-OM (Taxus 3 x 20);; '07 - PCI to pRCA ISR- Cypher DES PCI August 2015 -- PCI to the RCA & OM (in-stent restenosis, and OM). Follow-up Showed patent stents and grafts. July 18-20, 2018 non-STEMI: Cath revealed CTO occlusion of pLAD (not PCI target). Patent LIMA-LAD & RCA with patent LCx-OM stents  -- med Rx:  He was put back on Plavix. Started on Ranexa; patent LIMA-LAD and RCA as well as circumflex stents He also has history of AAA s/p EVAR  April 2018:  Embolization of Lumbar Artery- VIR (Dr. Barbie Banner). Jan 2018: IMA "stabilization" by Dr. Donzetta Matters. June 2018-- f/u with Dr Donzetta Matters ([facility]) for AAA -  Noted to have a type II endoleak.   Coil embolization March 25, 2017 Describes claudication symptoms in both calves after walking to having 3 blocks He is intolerant of statins currently on fenofibrate (having stopped Zetia).  [name] was last seen on May 29, 2020.  He had several seem depressed.  Was still having leg discomfort with walking-not sure if this is claudication or hip/back pain.  Was not taking diuretic because of frequent urination and nocturia.  Noted feeling tired all the time.  Get short of breath easily-only able to walk about 1/4 mile without having to stop.  No chest pain or pressure.  More fatigued than dyspnea.  Recent Hospitalizations: n/a  Reviewed  CV studies:    The following studies were reviewed today: (if available, images/films reviewed: From Epic Chart or Care Everywhere) LEA Dopplers 03/04/2021: R ABI 1.1, L ABI 1.01  Lexiscan Myoview 05/26/2021: Intermediate risk (similar findings to last study), with exception of reduced EF roughly 40%..  Prior basal inferior-inferolateral infarct without peri-infarct ischemia.   ->  Echo ordered  -> (EF 45 to 50%) Transthoracic Echo 06/10/2021: EF 45 to 50%.  Mildly reduced function.  Mild hypokinesis of basal-mid inferior and inferolateral wall consistent with prior infarct.  Severe LA dilation.  Mild to moderate MR with moderate MAC.  Mild calcific AS (mean gradient 11 mmHg) --> LV function seems unchanged.  Diastolic function improved.  Wall motion abnormality unchanged.  Aortic stenosis unchanged.  CT Abdomen Pelvis 06/25/2021:Redemonstrated findings of aortobiiliac endograft stent repair of infrarenal AAA with coiling of the inferior mesenteric artery and right L4 lumbar arteries.  Excluded aneurysmal sac is minimally enlarged (6.6 x 5.5 versus 6.3 x 5.2 cm) concerning for persistent endoleak.  Also noted sigmoid diverticulosis, hiatal hernia and aortic atherosclerosis.   Interval History:   [name] returns here today for annual follow-up to discuss results of his follow-up test.  He pretty much states that there is no real change to his level of exertional  dyspnea.  Still walks about a quarter of a mile without having to stop to catch his breath.  This is stable and not associate with chest pain or pressure.  He is notably limited by back knee and hip pain more so than real exertional dyspnea or fatigue.  As such, he has become somewhat deconditioned.  He denies any chest pain or pressure with rest or exertion, no resting dyspnea.  No PND, orthopnea with trivial edema.  CV Review of Symptoms (Summary) Cardiovascular ROS: positive for - dyspnea on exertion and Stable at exercise intolerance/fatigue.  Tired most of time.  Walks 1/4 mile.  Plus or minus claudication-difficult to assess, because of also associated back hip and knee pain.. negative for - chest pain, irregular heartbeat, orthopnea, palpitations, paroxysmal nocturnal dyspnea, rapid heart rate, shortness of breath, or Lightheadedness, dizziness or wooziness, syncope/near syncope or TIA/amaurosis fugax.  REVIEWED OF SYSTEMS    Review of Systems  Constitutional:  Positive for malaise/fatigue (Tires easily.  No real change.). Negative for weight loss.  HENT:  Negative for congestion and nosebleeds.   Respiratory:  Positive for shortness of breath (On exertion.). Negative for cough.   Cardiovascular:  Positive for claudication.  Gastrointestinal:  Negative for blood in stool and melena.  Genitourinary:  Positive for frequency (Nocturia every 1-2 hours.). Negative for hematuria.  Musculoskeletal:  Positive for back pain and joint pain (Hips and knees).       Decreased mobility  Neurological:  Negative for dizziness and focal weakness.  Psychiatric/Behavioral:  Positive for depression. Negative for memory loss. The patient is not nervous/anxious and does not have insomnia (Frequent nocturia.).     PAST MEDICAL HISTORY   Past Medical History:  Diagnosis Date   Adenomatous colon polyp    Ankle edema    Chronic   Asthma    CAD S/P percutaneous coronary angioplasty 2004   a) '04: Staged Taxus DES PCI to RCA (2 Taxus 2.75 x 32 & 12) and Cx-OM (Taxus 3 x 20);;'07 - PCI to pRCA ISR- Cypher DES; c) 05/2014: pPCI pRCA stentISR (Promus DES 3 x 8) OM1 distal to stent (Promus DES 2.75 x 12 - 3.1)   CAD, multiple vessel 1985   Most recent July 2018: Admitted for non-STEMI. Occluded LAD with patent LIMA (SP1 now occluded).  Patent stents in proximal and mid RCA as well as circumflex-OM 3. Known occlusion of SVG-OM. EF 45-50%   CHF (congestive heart failure) ([place])    Cholelithiasis - with cholangitis & choledocholithiasis    status post ERCP with removal of calculi and biliary stent placement.   Chronic anemia    On iron supplement; history of positive guaiac - negative colonoscopy in 1996.; Thought to be related to hemorrhoids; status post hemorrhoidectomy   Chronic back pain     multiple surgeries; C-spine and lumbar   Diabetes mellitus    on oral medication   Diverticulitis of colon 1996   Diverticulosis     Dyslipidemia, goal LDL below 70    Erectile dysfunction    Exertional dyspnea    Chronic baseline SOB with ambulation   GERD (gastroesophageal reflux disease)    Grade II diastolic dysfunction    H/O: pneumonia 11/2012   Hemorrhoids    Hiatal hernia    History of: ST elevation myocardial infarction (STEMI) involving left circumflex coronary artery with complication 8546   PTCA-circumflex; PCI in 1991   Hypertension    Moderate aortic stenosis by prior echocardiogram 07/2015   Normal  LV function - EF 55-60%.. Abnormal relaxation. Mild-moderate aortic stenosis (peak/mean gradient 18/10 mmH)   Osteoarthritis of both knees    And back; multiple back surgeries, right knee arthroplasty and left knee arthroscopic surgery x2   Pneumonia 2016   S/P AAA (abdominal aortic aneurysm) repair 08/30/2012   s/p EVAR   S/P CABG x 2 1997   LIMA-LAD, SVG-OM   Sleep apnea    pt. states he was told to return for f/u, to be fitted for Cpap, but pt. reports that he didn't follow up-no cpap used   Statin myopathy 09/30/2015    PAST SURGICAL HISTORY -> Full history reviewed.   Past Surgical History:  Procedure Laterality Date   ABDOMINAL AORTIC ENDOVASCULAR STENT GRAFT N/A 08/13/2015   Procedure: ABDOMINAL AORTIC ENDOVASCULAR STENT GRAFT;  Surgeon: Mal Misty, MD;  Location: [facility] OR;  Service: Vascular;  Laterality: N/A;   Anterior cervical plating  04/23/2010   At C4-5 and a C6-7 utilizing two separate Biomet MaxAn plates.   ANTERIOR LAT LUMBAR FUSION Left 11/22/2012   Procedure: ANTERIOR LATERAL LUMBAR FUSION 1 LEVEL;  Surgeon: Eustace Moore, MD;  Location: [place] NEURO ORS;  Service: Neurosurgery;  Laterality: Left;  Anterior Lateral Lumbar Fusion Lumbar Three-Four   BACK SURGERY  1979 & x 10   pt. remarks, "I have had about 10 back surgeries"   CARDIAC CATHETERIZATION  06/2003   None Occluded vein graft to OM; diffuse RCA disease in the mid vessel, 80% circumflex-OM stenosis; follow on AV groove  circumflex with sequential 90% stenoses and intervening saccular dilation    CARDIAC CATHETERIZATION  12/2008   4 abnormal Myoview showing apical thinning (possibly due to apical LAD 95%) : 100% Occluded LAD after SV1, distal LAD grafted via LIMA -apical 95% . Cx -OM1 w/patent stent extending into OM 1 . Follow on Cx - 70-80% - non-amenable PCI. RCA widely patent 3 overlapping stents in mRCA w/less than 40% stenosisin RPL; SVG-OM known occluded    Cervical arthrodesis  04/23/2010   Anterior cervical arthrodesis, C4-5, C6-7 utilizing 7-mm PEEK interbody cage packed with local autograft & Antifuse putty at C4-5 & an 8-mm cage at C6-7.   CERVICAL DISCECTOMY  04/23/2010   Decompressive anterior carvical diskectomy. C4-5, C6-7   COLONOSCOPY  1996   CORONARY ANGIOPLASTY  1985,1991,15   1985 lateral STEMI Circumflex PTCA;    CORONARY ARTERY BYPASS GRAFT  1997   LIMA-LAD, SVG-OM (SVG known to be occluded prior to 2004)   ERCP N/A 07/03/2014   Procedure: ENDOSCOPIC RETROGRADE CHOLANGIOPANCREATOGRAPHY (ERCP);  Surgeon: Inda Castle, MD;  Location: [place];  Service: Endoscopy;  Laterality: N/A;   ERCP N/A 07/05/2014   Procedure: ENDOSCOPIC RETROGRADE CHOLANGIOPANCREATOGRAPHY (ERCP);  Surgeon: Inda Castle, MD;  Location: [place];  Service: Endoscopy;  Laterality: N/A;   ERCP N/A 06/30/2015   Procedure: ENDOSCOPIC RETROGRADE CHOLANGIOPANCREATOGRAPHY (ERCP);  Surgeon: Ladene Artist, MD;  Location: [facility];  Service: Endoscopy;  Laterality: N/A;   EYE SURGERY     IR ANGIOGRAM EXTREMITY LEFT  01/18/2017   IR ANGIOGRAM PELVIS SELECTIVE OR SUPRASELECTIVE  01/18/2017   IR ANGIOGRAM SELECTIVE EACH ADDITIONAL VESSEL  01/18/2017   IR AORTAGRAM ABDOMINAL SERIALOGRAM  01/18/2017   IR EMBO ARTERIAL NOT HEMORR HEMANG INC GUIDE ROADMAPPING  01/18/2017   x 2   LEFT HEART CATH AND CORS/GRAFTS ANGIOGRAPHY N/A 04/20/2017   Procedure: Left Heart Cath and Cors/Grafts Angiography;  Surgeon: Lorretta Harp, MD;  Location: [place]  CV LAB;  LAD now occluded prior to SP1.LIMA-LAD. Patent RCA and circumflex stents. EF 45-50%. Relatively stable.    LEFT HEART CATHETERIZATION WITH CORONARY ANGIOGRAM N/A 05/15/2014   Procedure: LEFT HEART CATHETERIZATION WITH CORONARY ANGIOGRAM;  Surgeon: Sinclair Grooms, MD;  Location: [facility] CATH LAB;  Service: Cardiovascular;  Laterality: N/A;   LEFT HEART CATHETERIZATION WITH CORONARY/GRAFT ANGIOGRAM N/A 06/28/2014   Procedure: LEFT HEART CATHETERIZATION WITH Beatrix Fetters;  Surgeon: Troy Sine, MD;  Location: [facility] CATH LAB;  Service: Cardiovascular;  Laterality: N/A;   LUMBAR PERCUTANEOUS PEDICLE SCREW 1 LEVEL N/A 11/22/2012   Procedure: LUMBAR PERCUTANEOUS PEDICLE SCREW 1 LEVEL;  Surgeon: Eustace Moore, MD;  Location: [place] NEURO ORS;  Service: Neurosurgery;  Laterality: N/A;  Lumbar Three-Four Percutaneous Pedicle Screw, Lateral approach   MINOR HEMORRHOIDECTOMY     NM MYOVIEW LTD  05/26/2021   Lexiscan: Intermediate risk (similar findings to last study), with exception of reduced EF roughly 40%..  Prior basal inferior-inferolateral infarct without peri-infarct ischemia.   ->  Echo ordered -> (EF 45 to 50%)-when compared to prior Myoview, infarct appears similar.   PERCUTANEOUS CORONARY STENT INTERVENTION (PCI-S)  06/2003   PCI - RCA 2 overlapping Taxus DES 2.75 mm x 32 mm and 2.75 mm x 12 mm (3.0 mm); PCI-Cx-OM1 - Taxus DES 3.0 mm x 20 mm (3.1 mm);    PERCUTANEOUS CORONARY STENT INTERVENTION (PCI-S)  12/2005   80% ISR in proximal Taxus stent in RCA -- covered proximally with Cypher DES 3.0 mm x 12 mm   PERCUTANEOUS CORONARY STENT INTERVENTION (PCI-S) N/A 05/17/2014   Procedure: PERCUTANEOUS CORONARY STENT INTERVENTION (PCI-S);  Surgeon: Sinclair Grooms, MD;  Location: [facility] CATH LAB: PCI pRCA stent ISR - Promus DES 3.0 x 8 (3.25), OM distal stent edge - Promus DES 2.75 x 12 (3.1)   PERIPHERAL VASCULAR CATHETERIZATION  11/03/2016   Procedure:  Embolization;  Surgeon: Waynetta Sandy, MD;  Location: [place] CV LAB;  Service: Cardiovascular;;   POSTERIOR CERVICAL FUSION/FORAMINOTOMY N/A 05/02/2013   Procedure: POSTERIOR CERVICAL FUSION/FORAMINOTOMY CERVICAL SEVEN THORACIC-ONE;  Surgeon: Eustace Moore, MD;  Location: [place] NEURO ORS;  Service: Neurosurgery;  Laterality: N/A;  POSTERIOR CERVICAL FUSION/FORAMINOTOMY CERVICAL SEVEN THORACIC-ONE   TOTAL KNEE ARTHROPLASTY Right    TOTAL KNEE ARTHROPLASTY Left 12/20/2016   Procedure: LEFT TOTAL KNEE ARTHROPLASTY;  Surgeon: Vickey Huger, MD;  Location: [place];  Service: Orthopedics;  Laterality: Left;   TRANSTHORACIC ECHOCARDIOGRAM  06/10/2021   EF 45 to 50%.  Mildly reduced function.  Mild hypokinesis of basal-mid inferior and inferolateral wall consistent with prior infarct.  Severe LA dilation.  Mild to moderate MR with moderate MAC.  Mild calcific AS (mean gradient 11 mmHg) --> LV function seems unchanged.  Diastolic function improved.  Wall motion abnormality unchanged.  AS unchanged.   TRANSTHORACIC ECHOCARDIOGRAM  07/2018   Normal LV size.  EF 50 to 55% with mild HK of inferolateral wall.  GRII DD.  Mild AS with mean gradient 15 mm or greater.  Mild ascending aortic dilation.  Mildly increased PA pressures of 41 mmHg   UPPER GI ENDOSCOPY  07/03/2019   VISCERAL ANGIOGRAM  11/03/2016   Procedure: Visceral Angiogram;  Surgeon: Waynetta Sandy, MD;  Location: [place] CV LAB;  Service: Cardiovascular;;   CARDIAC CATH 04/20/2017: New - LAD now 100% prior to SP1/Diag1.  Not good PCI option.  Prox RCA to Mid RCA stents & Ost 3rd Mrg to 3rd Mrg stents, widely  patent Ost LAD to Prox LAD lesion, 100 %stenosed - prior to SP1/Diag1 (~ new),   LIMA-LAD normal.  Other grafts known to be occluded. EF is 45-50% by visual estimate. Normal LVEDP    Immunization History  Administered Date(s) Administered   DTaP 02/19/2000   Fluad Quad(high Dose 65+) 06/12/2020   Influenza Split  08/03/2002, 09/22/2006, 07/09/2008, 07/26/2012   Influenza Whole 07/15/2009   Influenza, High Dose Seasonal PF 08/17/2013, 05/29/2015, 10/19/2016, 07/20/2017, 07/20/2018, 06/19/2019   PFIZER(Purple Top)SARS-COV-2 Vaccination 11/09/2019, 12/04/2019   Pneumococcal Conjugate-13 03/01/2017   Pneumococcal Polysaccharide-23 01/19/2007, 06/04/2014   Tdap 01/19/2007    MEDICATIONS/ALLERGIES   Current Meds  Medication Sig   acetaminophen (TYLENOL) 500 MG tablet Take 1,000 mg by mouth every 6 (six) hours as needed for mild pain, moderate pain or headache. Reported on 03/16/2016   amLODipine (NORVASC) 5 MG tablet Take 1 tablet by mouth once daily   Choline Fenofibrate (FENOFIBRIC ACID) 135 MG CPDR TAKE ONE CAPSULE BY MOUTH EVERY DAY   clopidogrel (PLAVIX) 75 MG tablet Take 1 tablet by mouth once daily   ezetimibe (ZETIA) 10 MG tablet Take 1 tablet (10 mg total) by mouth daily.   Ferrous Sulfate (IRON PO) Take 65 mg by mouth.   Multiple Vitamins-Minerals (MULTIVITAMIN WITH MINERALS) tablet Take 1 tablet by mouth daily.   pantoprazole (PROTONIX) 40 MG tablet Take 1 tablet by mouth once daily   ranolazine (RANEXA) 500 MG 12 hr tablet Take 1 tablet by mouth twice daily   sildenafil (VIAGRA) 100 MG tablet TAKE ONE TABLET BY MOUTH DAILY AS NEEDED FOR ERECTILE DYSFUNCTION   valsartan-hydrochlorothiazide (DIOVAN HCT) 320-12.5 MG tablet Take 1 tablet by mouth daily.    Allergies  Allergen Reactions   Oxycodone Shortness Of Breath and Cough   Lisinopril Cough   Statins Other (See Comments)    MYALGIAS    Welchol [Colesevelam Hcl] Itching    SOCIAL HISTORY/FAMILY HISTORY   Reviewed in Epic:  Pertinent findings:  Social History   Tobacco Use   Smoking status: Former    Packs/day: 3.00    Years: 45.00    Pack years: 135.00    Types: Cigarettes    Quit date: 10/05/1983    Years since quitting: 37.7   Smokeless tobacco: Never  Vaping Use   Vaping Use: Never used  Substance Use Topics    Alcohol use: Yes    Alcohol/week: 4.0 standard drinks    Types: 4 Glasses of wine per week    Comment:  4 oz. wine daily   Drug use: No   Social History   Social History Narrative   He is married, father of two, grandfather to 31, great grandfather to two.    Not really getting much exercise now, do to his significant back and hip pain.    He does not smoke and only has an alcoholic beverage.     OBJCTIVE -PE, EKG, labs   Wt Readings from Last 3 Encounters:  06/26/21 209 lb (94.8 kg)  05/26/21 212 lb (96.2 kg)  11/24/20 212 lb 6.4 oz (96.3 kg)    Physical Exam: BP 140/70 (BP Location: Right Arm)   Pulse 69   Ht _0  (1.753 m)   Wt 209 lb (94.8 kg)   SpO2 97%   BMI 30.86 kg/m  Physical Exam Vitals reviewed.  Constitutional:      General: He is not in acute distress.    Appearance: Normal appearance. He is obese. He is not ill-appearing.  Comments: Healthy-appearing, but just mildly obese.  Well-groomed.  HENT:     Head: Normocephalic and atraumatic.  Neck:     Vascular: No carotid bruit, hepatojugular reflux or JVD.  Cardiovascular:     Rate and Rhythm: Normal rate and regular rhythm. No extrasystoles are present.    Chest Wall: PMI is not displaced.     Pulses: Decreased pulses (1+ pedal pulses).     Heart sounds: S1 normal and S2 normal. Murmur (2/6 SEM at RUSB-neck) heard.    No friction rub. No gallop.  Pulmonary:     Effort: Pulmonary effort is normal. No respiratory distress.     Breath sounds: Normal breath sounds.  Chest:     Chest wall: No tenderness.  Musculoskeletal:        General: Normal range of motion.     Cervical back: Normal range of motion and neck supple.     Right lower leg: Edema (Trace to 1+) present.     Left lower leg: Edema (1+) present.  Skin:    General: Skin is warm and dry.  Neurological:     General: No focal deficit present.     Mental Status: He is alert and oriented to person, place, and time.     Motor: No weakness.      Gait: Gait normal.  Psychiatric:        Thought Content: Thought content normal.        Judgment: Judgment normal.     Comments: Still seems little down.  Easily frustrated and agitated.  Upset about waiting.     Adult ECG Report Not checked  Recent Labs: Reviewed. Lab Results  Component Value Date   CHOL 197 10/22/2020   HDL 55 10/22/2020   LDLCALC 124 (H) 10/22/2020   TRIG 100 10/22/2020   CHOLHDL 3.6 10/22/2020   Lab Results  Component Value Date   CREATININE 1.54 (H) 10/22/2020   BUN 17 10/22/2020   NA 138 10/22/2020   K 5.2 10/22/2020   CL 102 10/22/2020   CO2 22 10/22/2020   CBC Latest Ref Rng & Units 10/22/2020 11/15/2019 07/20/2017  WBC 3.4 - 10.8 x10E3/uL 6.5 5.2 4.4  Hemoglobin 13.0 - 17.7 g/dL 13.4 12.0(L) 11.0(L)  Hematocrit 37.5 - 51.0 % 39.7 37.2(L) 33.0(L)  Platelets 150 - 450 x10E3/uL 195 195 214    Lab Results  Component Value Date   HGBA1C 5.8 (A) 06/12/2020   ==================================================  COVID-19 Education: The signs and symptoms of COVID-19 were discussed with the patient and how to seek care for testing (follow up with PCP or arrange E-visit).    I spent a total of 21 minutes with the patient spent in direct patient consultation.  Additional time spent with chart review  / charting (studies, outside notes, etc): 21 min Total Time: 42 min  Current medicines are reviewed at length with the patient today.  (+/- concerns) N/A  This visit occurred during the SARS-CoV-2 public health emergency.  Safety protocols were in place, including screening questions prior to the visit, additional usage of staff PPE, and extensive cleaning of exam room while observing appropriate contact time as indicated for disinfecting solutions.  Notice: This dictation was prepared with Dragon dictation along with smaller phrase technology. Any transcriptional errors that result from this process are unintentional and may not be corrected upon  review.  Patient Instructions / Medication Changes & Studies & Tests Ordered   Patient Instructions  Medication Instructions:   No changes  *  If you need a refill on your cardiac medications before your next appointment, please call your pharmacy*   Lab Work:  Not needed If you have labs (blood work) drawn today and your tests are completely normal, you will receive your results only by: [place] (if you have MyChart) OR A paper copy in the mail If you have any lab test that is abnormal or we need to change your treatment, we will call you to review the results.   Testing/Procedures:  Not needed   Follow-Up: At [facility], you and your health needs are our priority.  As part of our continuing mission to provide you with exceptional heart care, we have created designated Provider Care Teams.  These Care Teams include your primary Cardiologist (physician) and Advanced Practice Providers (APPs -  Physician Assistants and Nurse Practitioners) who all work together to provide you with the care you need, when you need it.  We recommend signing up for the patient portal called "MyChart".  Sign up information is provided on this After Visit Summary.  MyChart is used to connect with patients for Virtual Visits (Telemedicine).  Patients are able to view lab/test results, encounter notes, upcoming appointments, etc.  Non-urgent messages can be sent to your provider as well.   To learn more about what you can do with MyChart, go to [email or web address].    Your next appointment:   12 month(s)  The format for your next appointment:   In Person  Provider:   Glenetta Hew, MD       Studies Ordered:   No orders of the defined types were placed in this encounter.    Glenetta Hew, M.D., M.S. Interventional Cardiologist   Pager # [phone] Phone # [phone] [address] [place], [place] [address]   Thank you for choosing [facility]!!

## 2021-06-26 NOTE — Patient Instructions (Signed)

## 2021-07-02 ENCOUNTER — Ambulatory Visit
Admission: RE | Admit: 2021-07-02 | Discharge: 2021-07-02 | Disposition: A | Discharge status: Home / Self Care | Payer: Medicare Other | Source: Ambulatory Visit | Attending: Interventional Radiology | Admitting: Interventional Radiology

## 2021-07-02 DIAGNOSIS — I714 Abdominal aortic aneurysm, without rupture, unspecified: Secondary | ICD-10-CM

## 2021-07-02 HISTORY — PX: IR RADIOLOGIST EVAL & MGMT: IMG5224

## 2021-07-02 NOTE — Progress Notes (Signed)
Chief Complaint: Endoleak   Referring Physician(s): Dr. Donzetta Matters.    PCP: Dr. Jill Alexanders   History of Present Illness: [name] is an 85 y.o. male presenting to [facility] today as a scheduled follow up for his type II endoleak.     He is well known to our service, with his first referral to my partner, the late Dr. Barbie Banner, 01/04/2017.     He had some confusing automated phone calls today from [facility], and ws instructed to call in for his appointment.  He thus joins [place] today via telemedicine visit.  We confirmed his identity with 2 personal identifiers.    Hx: He has history of AAA treated with infra-renal fixation 08/13/2015.  This was performed with a Gore Excluder, [phone] main body, via the right, and left limb of 14x10.  The pre-operative diameter was ~5cm.  He had a type II endoleak identified, with interval growth of the excluded sac.  The IMA was embolized via TA approach by Dr. Donzetta Matters 11/03/2016, and then the L4 lumbar arteries were embolized by Dr. Barbie Banner 01/04/2017 for regrowth, to maximum diameter of 5.6cm.    Interval:  Mr Loudermilk tells me that he is feeling fine, with no abdominal pain, and no concerning back or flank pain.  He does tell me that he has ongoing back pain that has always  been there, which he attributes to his back as typical MSK-type pain.   He has had no interval hospitalizations.  He continue to stay active. He continues to see Vascular Surgery.  Our surveillance CT scans were initiated June of 2018, which have essentially been annual.   The baseline of 2018 shows estimated diameter of ~62mm.  The updated CT imaging from 06/25/21 shows estimated 12mm at similar location, by my estimate.  I believe the measurement by interpreting physician was a bit over estimated.  There are no concerning features of inflammation or fluid.    He continues maximal medical care, including medication for blood pressure control.   Past Medical History:  Diagnosis Date    Adenomatous colon polyp    Ankle edema    Chronic   Asthma    CAD S/P percutaneous coronary angioplasty 06/2003, 12/2005, 05/2014   a) '04: Staged Taxus DES PCI to RCA (2 Taxus 2.75 x 32 & 12) and Cx-OM (Taxus 3 x 20);;'07 - PCI to pRCA ISR- Cypher DES; c) 05/2014: pPCI pRCA stentISR (Promus DES 3 x 8) OM1 distal to stent (Promus DES 2.75 x 12 - 3.1)   CAD, multiple vessel 1985   Most recent July 2018: Admitted for non-STEMI. Occluded LAD with patent LIMA (SP1 now occluded).  Patent stents in proximal and mid RCA as well as circumflex-OM 3. Known occlusion of SVG-OM. EF 45-50%   CHF (congestive heart failure) ([place])    Cholelithiasis - with cholangitis & choledocholithiasis    status post ERCP with removal of calculi and biliary stent placement.   Chronic anemia    On iron supplement; history of positive guaiac - negative colonoscopy in 1996.; Thought to be related to hemorrhoids; status post hemorrhoidectomy   Chronic back pain     multiple surgeries; C-spine and lumbar   Diabetes mellitus    on oral medication   Diverticulitis of colon 1996   Diverticulosis    Dyslipidemia, goal LDL below 70    Erectile dysfunction    Exertional dyspnea    Chronic baseline SOB with ambulation   GERD (gastroesophageal reflux disease)  Grade II diastolic dysfunction    H/O: pneumonia February 14   Hemorrhoids    Hiatal hernia    History of: ST elevation myocardial infarction (STEMI) involving left circumflex coronary artery with complication 5465   PTCA-circumflex; PCI in 1991   Hypertension    Moderate aortic stenosis by prior echocardiogram 07/2015   Normal LV function - EF 55-60%.. Abnormal relaxation. Mild-moderate aortic stenosis (peak/mean gradient 18/10 mmH)   Osteoarthritis of both knees    And back; multiple back surgeries, right knee arthroplasty and left knee arthroscopic surgery x2   Pneumonia 2016   S/P AAA (abdominal aortic aneurysm) repair 08/30/2012   s/p EVAR   S/P CABG x 2 1997    LIMA-LAD, SVG-OM   Sleep apnea    pt. states he was told to return for f/u, to be fitted for Cpap, but pt. reports that he didn't follow up-no cpap used    Past Surgical History:  Procedure Laterality Date   Abdominal and Lower Extremity Arterial Ultrasound  08/23/2012; 10/10/2013   Normal ABIs. Nonocclusive lower extremity disease. 4.2 cm x 4.3 cm infrarenal AAA;; 4.4 cm x 4.3 cm (essentially stable)    ABDOMINAL AORTIC ENDOVASCULAR STENT GRAFT N/A 08/13/2015   Procedure: ABDOMINAL AORTIC ENDOVASCULAR STENT GRAFT;  Surgeon: Mal Misty, MD;  Location: [facility];  Service: Vascular;  Laterality: N/A;   Anterior cervical plating  04/23/10   At C4-5 and a C6-7 utilizing two separate Biomet MaxAn plates.   ANTERIOR LAT LUMBAR FUSION Left 11/22/2012   Procedure: ANTERIOR LATERAL LUMBAR FUSION 1 LEVEL;  Surgeon: Eustace Moore, MD;  Location: [place] NEURO ORS;  Service: Neurosurgery;  Laterality: Left;  Anterior Lateral Lumbar Fusion Lumbar Three-Four   BACK SURGERY  1979 & x 10   pt. remarks, "I have had about 10 back surgeries"   BILIARY STENT PLACEMENT N/A 07/03/2014   Procedure: BILIARY STENT PLACEMENT;  Surgeon: Inda Castle, MD;  Location: [place];  Service: Endoscopy;  Laterality: N/A;   CARDIAC CATHETERIZATION  September 2004   None Occluded vein graft to OM; diffuse RCA disease in the mid vessel, 80% circumflex-OM stenosis; follow on AV groove circumflex with sequential 90% stenoses and intervening saccular dilation    CARDIAC CATHETERIZATION  March 2010   4 abnormal Myoview showing apical thinning (possibly due to apical LAD 95%) : 100% Occluded LAD after SV1, distal LAD grafted via LIMA -apical 95% . Cx -OM1 w/patent stent extending into OM 1 . Follow on Cx - 70-80% - non-amenable PCI. RCA widely patent 3 overlapping stents in mRCA w/less than 40% stenosisin RPL; SVG-OM known occluded    CARPAL TUNNEL RELEASE Left 11/16/2019   Procedure: LEFT CARPAL TUNNEL RELEASE;  Surgeon: Mcarthur Rossetti, MD;  Location: [facility]S;  Service: Orthopedics;  Laterality: Left;   CATARACT EXTRACTION     Cervical arthrodesis  04/23/10   Anterior cervical arthrodesis, C4-5, C6-7 utilizing 7-mm PEEK interbody cage packed with local autograft & Antifuse putty at C4-5 & an 8-mm cage at C6-7.   CERVICAL DISCECTOMY  04/23/10   Decompressive anterior carvical diskectomy. C4-5, C6-7   CHOLECYSTECTOMY N/A 05/23/2015   Procedure: LAPAROSCOPIC CHOLECYSTECTOMY WITH INTRAOPERATIVE CHOLANGIOGRAM;  Surgeon: Alphonsa Overall, MD;  Location: [facility] ORS;  Service: General;  Laterality: N/A;   [place]  1985,1991,15   1985 lateral STEMI Circumflex PTCA;    CORONARY ARTERY BYPASS GRAFT  1997   LIMA-LAD, SVG-OM (SVG known to be occluded prior  to 2004)   ERCP N/A 07/03/2014   Procedure: ENDOSCOPIC RETROGRADE CHOLANGIOPANCREATOGRAPHY (ERCP);  Surgeon: Inda Castle, MD;  Location: [facility];  Service: Endoscopy;  Laterality: N/A;   ERCP N/A 07/05/2014   Procedure: ENDOSCOPIC RETROGRADE CHOLANGIOPANCREATOGRAPHY (ERCP);  Surgeon: Inda Castle, MD;  Location: [place];  Service: Endoscopy;  Laterality: N/A;   ERCP N/A 06/30/2015   Procedure: ENDOSCOPIC RETROGRADE CHOLANGIOPANCREATOGRAPHY (ERCP);  Surgeon: Ladene Artist, MD;  Location: [facility];  Service: Endoscopy;  Laterality: N/A;   EYE SURGERY     IR ANGIOGRAM EXTREMITY LEFT  01/18/2017   IR ANGIOGRAM PELVIS SELECTIVE OR SUPRASELECTIVE  01/18/2017   IR ANGIOGRAM SELECTIVE EACH ADDITIONAL VESSEL  01/18/2017   IR AORTAGRAM ABDOMINAL SERIALOGRAM  01/18/2017   IR EMBO ARTERIAL NOT HEMORR HEMANG INC GUIDE ROADMAPPING  01/18/2017   IR RADIOLOGIST EVAL & MGMT  01/04/2017   IR RADIOLOGIST EVAL & MGMT  10/26/2017   IR RADIOLOGIST EVAL & MGMT  05/24/2018   IR RADIOLOGIST EVAL & MGMT  06/19/2019   IR RADIOLOGIST EVAL & MGMT  06/11/2020   IR US GUIDE VASC ACCESS RIGHT  01/18/2017   KNEE ARTHROSCOPY Left    x 2   LEFT HEART CATH AND  CORS/GRAFTS ANGIOGRAPHY N/A 04/20/2017   Procedure: Left Heart Cath and Cors/Grafts Angiography;  Surgeon: Lorretta Harp, MD;  Location: [place] CV LAB;  LAD now occluded prior to SP1.LIMA-LAD. Patent RCA and circumflex stents. EF 45-50%. Relatively stable.    LEFT HEART CATHETERIZATION WITH CORONARY ANGIOGRAM N/A 05/15/2014   Procedure: LEFT HEART CATHETERIZATION WITH CORONARY ANGIOGRAM;  Surgeon: Sinclair Grooms, MD;  [facility] CATH LAB;  Service: Cardiovascular;  Laterality: N/A;   LEFT HEART CATHETERIZATION WITH CORONARY/GRAFT ANGIOGRAM N/A 06/28/2014   Procedure: LEFT HEART CATHETERIZATION WITH Beatrix Fetters;  Surgeon: Troy Sine, MD;  Location: [facility] CATH LAB;  Service: Cardiovascular;  Laterality: N/A;   LUMBAR PERCUTANEOUS PEDICLE SCREW 1 LEVEL N/A 11/22/2012   Procedure: LUMBAR PERCUTANEOUS PEDICLE SCREW 1 LEVEL;  Surgeon: Eustace Moore, MD;  Location: [place] NEURO ORS;  Service: Neurosurgery;  Laterality: N/A;  Lumbar Three-Four Percutaneous Pedicle Screw, Lateral approach   MINOR HEMORRHOIDECTOMY     NM MYOVIEW LTD  December 2013   LOW RISK. Mmoderate region of mid to basal inferolateral scar without ischemia. Mild apical hypokinesis with an EF of 47%.   PERCUTANEOUS CORONARY STENT INTERVENTION (PCI-S)  September 2004   PCI - RCA 2 overlapping Taxus DES 2.75 mm x 32 mm and 2.75 mm x 12 mm (3.0 mm); PCI-Cx-OM1 - Taxus DES 3.0 mm x 20 mm (3.1 mm);    PERCUTANEOUS CORONARY STENT INTERVENTION (PCI-S)  12/2005   80% ISR in proximal Taxus stent in RCA -- covered proximally with Cypher DES 3.0 mm x 12 mm   PERCUTANEOUS CORONARY STENT INTERVENTION (PCI-S) N/A 05/17/2014   Procedure: PERCUTANEOUS CORONARY STENT INTERVENTION (PCI-S);  Surgeon: Sinclair Grooms, MD;  Location: [facility] CATH LAB: PCI pRCA stent ISR - Promus DES 3.0 x 8 (3.25), OM distal stent edge - Promus DES 2.75 x 12 (3.1)   PERIPHERAL VASCULAR CATHETERIZATION  11/03/2016   Procedure: Embolization;  Surgeon: Waynetta Sandy, MD;  Location: [place] CV LAB;  Service: Cardiovascular;;   POSTERIOR CERVICAL FUSION/FORAMINOTOMY N/A 05/02/2013   Procedure: POSTERIOR CERVICAL FUSION/FORAMINOTOMY CERVICAL SEVEN THORACIC-ONE;  Surgeon: Eustace Moore, MD;  Location: [place] NEUR[facility] ORS;  Service: Neurosurgery;  Laterality: N/A;  POSTERIOR CERVICAL FUSION/FORAMINOTOMY CERVICAL SEVEN THORACIC-ONE  SPHINCTEROTOMY N/A 06/30/2015   Procedure: SPHINCTEROTOMY;  Surgeon: Ladene Artist, MD;  Location: [facility];  Service: Endoscopy;  Laterality: N/A;   TOTAL KNEE ARTHROPLASTY Right    TOTAL KNEE ARTHROPLASTY Left 12/20/2016   Procedure: LEFT TOTAL KNEE ARTHROPLASTY;  Surgeon: Vickey Huger, MD;  Location: [place];  Service: Orthopedics;  Laterality: Left;   TRANSTHORACIC ECHOCARDIOGRAM  07/2016   EF 45-50%. Inferolateral hypokinesis (correlates with scar noted on Myoview). GR 1 DD. Mild aortic stenosis. Mod LAE.   TRANSTHORACIC ECHOCARDIOGRAM  07/2018   Normal LV size.  EF 50 to 55% with mild HK of inferolateral wall.  GRII DD.  Mild AS with mean gradient 15 mm or greater.  Mild ascending aortic dilation.  Mildly increased PA pressures of 41 mmHg   UPPER GI ENDOSCOPY  07/03/2019   VISCERAL ANGIOGRAM  11/03/2016   Procedure: Visceral Angiogram;  Surgeon: Waynetta Sandy, MD;  Location: [place] CV LAB;  Service: Cardiovascular;;    Allergies: Oxycodone, Lisinopril, Statins, and Welchol [colesevelam hcl]  Medications: Prior to Admission medications   Medication Sig Start Date End Date Taking? Authorizing Provider  acetaminophen (TYLENOL) 500 MG tablet Take 1,000 mg by mouth every 6 (six) hours as needed for mild pain, moderate pain or headache. Reported on 03/16/2016    [provider]  amLODipine (NORVASC) 5 MG tablet Take 1 tablet by mouth once daily 04/29/21   Leonie Man, MD  aspirin EC 81 MG EC tablet Take 1 tablet (81 mg total) by mouth daily. Patient not taking: Reported on 06/26/2021  04/23/17   Daune Perch, NP  Choline Fenofibrate (FENOFIBRIC ACID) 135 MG CPDR TAKE ONE CAPSULE BY MOUTH EVERY DAY 06/09/21   Leonie Man, MD  clopidogrel (PLAVIX) 75 MG tablet Take 1 tablet by mouth once daily 06/09/21   Leonie Man, MD  ezetimibe (ZETIA) 10 MG tablet Take 1 tablet (10 mg total) by mouth daily. 02/11/21   Leonie Man, MD  Ferrous Sulfate (IRON PO) Take 65 mg by mouth.    [provider]  glucose blood (ONETOUCH VERIO) test strip USE ONE STRIP TO CHECK GLUCOSE TWICE DAILY E11.9 Patient not taking: Reported on 06/26/2021 06/15/17   Denita Lung, MD  metFORMIN (GLUCOPHAGE) 500 MG tablet Take 1 tablet by mouth once daily with breakfast Patient not taking: Reported on 06/26/2021 12/07/19   Denita Lung, MD  Multiple Vitamins-Minerals (MULTIVITAMIN WITH MINERALS) tablet Take 1 tablet by mouth daily.    [provider]  pantoprazole (PROTONIX) 40 MG tablet Take 1 tablet by mouth once daily 06/22/21   Leonie Man, MD  ranolazine (RANEXA) 500 MG 12 hr tablet Take 1 tablet by mouth twice daily 12/02/20   Leonie Man, MD  sildenafil (REVATIO) 20 MG tablet TAKE 2-5 TABLETS BY MOUTH AS NEEDED FOR SEXUAL ACTIVITY Patient not taking: Reported on 06/26/2021 11/28/17   Denita Lung, MD  sildenafil (VIAGRA) 100 MG tablet TAKE ONE TABLET BY MOUTH DAILY AS NEEDED FOR ERECTILE DYSFUNCTION 01/07/21   Denita Lung, MD  triamcinolone (KENALOG) 0.1 % apply bid to arms prn itching Patient not taking: Reported on 06/26/2021 07/07/20   [provider]  valsartan-hydrochlorothiazide (DIOVAN HCT) 320-12.5 MG tablet Take 1 tablet by mouth daily. 10/29/20   Denita Lung, MD     Family History  Problem Relation Age of Onset   Arthritis Mother    Diabetes Father    Heart disease Father  Stroke Sister    Hypertension Sister    Heart disease Sister    Diabetes Sister    Breast cancer Sister    Heart disease Brother    Ulcers Brother    Colon cancer Neg  Hx     Social History   Socioeconomic History   Marital status: Married    Spouse name: Not on file   Number of children: Not on file   Years of education: Not on file   Highest education level: Not on file  Occupational History   Not on file  Tobacco Use   Smoking status: Former    Packs/day: 3.00    Years: 45.00    Pack years: 135.00    Types: Cigarettes    Quit date: 10/05/1983    Years since quitting: 37.7   Smokeless tobacco: Never  Vaping Use   Vaping Use: Never used  Substance and Sexual Activity   Alcohol use: Yes    Alcohol/week: 4.0 standard drinks    Types: 4 Glasses of wine per week    Comment:  4 oz. wine daily   Drug use: No   Sexual activity: Not Currently  Other Topics Concern   Not on file  Social History Narrative   He is married, father of two, grandfather to 68, great grandfather to two.    Not really getting much exercise now, do to his significant back and hip pain.    He does not smoke and only has an alcoholic beverage.    Social Determinants of Health   Financial Resource Strain: Not on file  Food Insecurity: Not on file  Transportation Needs: Not on file  Physical Activity: Not on file  Stress: Not on file  Social Connections: Not on file       Review of Systems  Review of Systems: A 12 point ROS discussed and pertinent positives are indicated in the HPI above.  All other systems are negative.  Physical Exam No direct physical exam was performed (except for noted visual exam findings with Video Visits).   Vital Signs: There were no vitals taken for this visit.  Imaging: CT ABDOMEN PELVIS WO CONTRAST  Result Date: 06/26/2021 CLINICAL DATA:  Infrarenal abdominal aortic aneurysm status post aortobiiliac stent endograft repair, status post prior IMA and lumbar coil endoleak repair EXAM: CT ABDOMEN AND PELVIS WITHOUT CONTRAST TECHNIQUE: Multidetector CT imaging of the abdomen and pelvis was performed following the standard protocol  without IV contrast. COMPARISON:  CT angiogram abdomen and pelvis, 06/04/2020 FINDINGS: Lower chest: No acute abnormality. Moderate hiatal hernia with intrathoracic position of the gastric fundus. Hepatobiliary: No focal liver abnormality is seen. Status post cholecystectomy. No biliary dilatation. Pancreas: Unremarkable. No pancreatic ductal dilatation or surrounding inflammatory changes. Spleen: Normal in size without significant abnormality. Adrenals/Urinary Tract: Stable, benign, macroscopic fat containing left adrenal myelolipoma. Redemonstrated, asymmetrically atrophic appearance of the right kidney. Kidneys are otherwise normal, without renal calculi, solid lesion, or hydronephrosis. Bladder is unremarkable. Stomach/Bowel: Stomach is within normal limits. Appendix appears normal. No evidence of bowel wall thickening, distention, or inflammatory changes. Sigmoid diverticula. Vascular/Lymphatic: Aortic atherosclerosis. Redemonstrated findings of aortobiiliac stent endograft repair of an infrarenal abdominal aortic aneurysm, with additional coiling of the inferior mesenteric artery and right L4 lumbar arteries. The excluded aneurysm sac is minimally enlarged compared to prior examination, measuring 6.6 x 5.5 cm, previously 6.3 x 5.2 cm when measured similarly (series 2, image 42). No enlarged abdominal or pelvic lymph nodes. Reproductive: Prostatomegaly. Other: No  abdominal wall hernia or abnormality. No abdominopelvic ascites. Musculoskeletal: No acute or significant osseous findings. IMPRESSION: 1. Redemonstrated findings of aortobiiliac stent endograft repair of an infrarenal abdominal aortic aneurysm, with additional coiling of the inferior mesenteric artery and right L4 lumbar arteries. The excluded aneurysm sac is minimally enlarged compared to prior examination, measuring 6.6 x 5.5 cm, previously 6.3 x 5.2 cm when measured similarly. Findings are concerning for a persistent endoleak resulting in aneurysm  sac enlargement given evidence on prior contrast enhanced angiogram. 2.  Sigmoid diverticulosis. 3.  Hiatal hernia. Aortic Atherosclerosis (ICD10-I70.0). Electronically Signed   By: Eddie Candle M.D.   On: 06/26/2021 09:13   ECHOCARDIOGRAM COMPLETE  Result Date: 06/10/2021    ECHOCARDIOGRAM REPORT   Patient Name:   PIERS BAADE Martis Date of Exam: 06/10/2021 Medical Rec #:  [id number]       Height:       69.0 in Accession #:    [id number]      Weight:       212.0 lb Date of Birth:  [birth year]       BSA:          2.118 m Patient Age:    11 years        BP:           132/78 mmHg Patient Gender: M               HR:           65 bpm. Exam Location:  [place] Procedure: 2D Echo, Cardiac Doppler and Color Doppler Indications:    I50.9* Heart failure (unspecified); I35.0 Nonrheumatic aortic                 (valve) stenosis; I25.10 Coronary Artery Disease Native Vessel  History:        Patient has prior history of Echocardiogram examinations, most                 recent 07/12/2018. Previous Myocardial Infarction, Prior CABG;                 Risk Factors:Diabetes and Dyslipidemia. Obesity.  Sonographer:    Diamond Nickel RCS Referring Phys: [place]  1. Left ventricular ejection fraction, by estimation, is 45 to 50%. The left ventricle has mildly decreased function. The left ventricle demonstrates regional wall motion abnormalities (see scoring diagram/findings for description). The left ventricular  internal cavity size was mildly dilated. Left ventricular diastolic parameters are consistent with Grade I diastolic dysfunction (impaired relaxation). There is moderate hypokinesis of the left ventricular, basal-mid inferior wall and inferolateral wall.  2. Right ventricular systolic function is mildly reduced. The right ventricular size is normal.  3. Left atrial size was severely dilated.  4. The mitral valve is normal in structure. Mild to moderate mitral valve regurgitation. No evidence of mitral  stenosis. Moderate mitral annular calcification.  5. The aortic valve is tricuspid. There is moderate calcification of the aortic valve. There is moderate thickening of the aortic valve. Aortic valve regurgitation is not visualized. Mild aortic valve stenosis. Aortic valve mean gradient measures 11.0 mmHg. Aortic valve Vmax measures 2.23 m/s. Comparison(s): Prior images reviewed side by side. The left ventricular systolic function is unchanged. The left ventricular diastolic function has improved. The left ventricular wall motion abnormality is unchanged. Aortic valve stenosis appears largely  unchanged, although gradients may be underestimated on the current sudy due to poor Doppler beam alignment. FINDINGS  Left Ventricle:  Left ventricular ejection fraction, by estimation, is 45 to 50%. The left ventricle has mildly decreased function. The left ventricle demonstrates regional wall motion abnormalities. Moderate hypokinesis of the left ventricular, basal-mid inferior wall and inferolateral wall. The left ventricular internal cavity size was mildly dilated. There is no left ventricular hypertrophy. Abnormal (paradoxical) septal motion consistent with post-operative status. Left ventricular diastolic  parameters are consistent with Grade I diastolic dysfunction (impaired relaxation). Indeterminate filling pressures.  LV Wall Scoring: The inferior wall and posterior wall are hypokinetic. Right Ventricle: The right ventricular size is normal. No increase in right ventricular wall thickness. Right ventricular systolic function is mildly reduced. Left Atrium: Left atrial size was severely dilated. Right Atrium: Right atrial size was normal in size. Pericardium: There is no evidence of pericardial effusion. Mitral Valve: The mitral valve is normal in structure. Moderate mitral annular calcification. Mild to moderate mitral valve regurgitation, with centrally-directed jet. No evidence of mitral valve stenosis. Tricuspid  Valve: The tricuspid valve is normal in structure. Tricuspid valve regurgitation is mild. Aortic Valve: The aortic valve is tricuspid. There is moderate calcification of the aortic valve. There is moderate thickening of the aortic valve. Aortic valve regurgitation is not visualized. Mild aortic stenosis is present. Aortic valve mean gradient measures 11.0 mmHg. Aortic valve peak gradient measures 19.9 mmHg. Aortic valve area, by VTI measures 1.62 cm. Pulmonic Valve: The pulmonic valve was normal in structure. Pulmonic valve regurgitation is trivial. Aorta: The aortic root and ascending aorta are structurally normal, with no evidence of dilitation. IAS/Shunts: No atrial level shunt detected by color flow Doppler.  LEFT VENTRICLE PLAX 2D LVIDd:         5.80 cm  Diastology LVIDs:         5.20 cm  LV e' medial:    7.29 cm/s LV PW:         1.10 cm  LV E/e' medial:  13.7 LV IVS:        1.00 cm  LV e' lateral:   9.79 cm/s LVOT diam:     2.05 cm  LV E/e' lateral: 10.2 LV SV:         87 LV SV Index:   41 LVOT Area:     3.30 cm  RIGHT VENTRICLE RV Basal diam:  3.20 cm RV S prime:     9.23 cm/s TAPSE (M-mode): 1.8 cm LEFT ATRIUM             Index       RIGHT ATRIUM           Index LA diam:        5.10 cm 2.41 cm/m  RA Area:     15.90 cm LA Vol (A2C):   76.0 ml 35.89 ml/m RA Volume:   44.90 ml  21.20 ml/m LA Vol (A4C):   59.0 ml 27.86 ml/m LA Biplane Vol: 67.6 ml 31.92 ml/m  AORTIC VALVE AV Area (Vmax):    1.69 cm AV Area (Vmean):   1.59 cm AV Area (VTI):     1.62 cm AV Vmax:           223.00 cm/s AV Vmean:          159.000 cm/s AV VTI:            0.539 m AV Peak Grad:      19.9 mmHg AV Mean Grad:      11.0 mmHg LVOT Vmax:         114.00 cm/s  LVOT Vmean:        76.500 cm/s LVOT VTI:          0.265 m LVOT/AV VTI ratio: 0.49  AORTA Ao Root diam: 3.30 cm Ao Asc diam:  3.60 cm MITRAL VALVE                TRICUSPID VALVE MV Area (PHT): 3.17 cm     TR Peak grad:   17.1 mmHg MV Decel Time: 239 msec     TR Vmax:         206.62 cm/s MV E velocity: 99.60 cm/s MV A velocity: 123.00 cm/s  SHUNTS MV E/A ratio:  0.81         Systemic VTI:  0.26 m                             Systemic Diam: 2.05 cm Dani Gobble Croitoru MD Electronically signed by Sanda Klein MD Signature Date/Time: 06/10/2021/4:04:48 PM    Final     Labs:  CBC: Recent Labs    10/22/20 1100  WBC 6.5  HGB 13.4  HCT 39.7  PLT 195    COAGS: No results for input(s): INR, APTT in the last 8760 hours.  BMP: Recent Labs    10/22/20 1100  NA 138  K 5.2  CL 102  CO2 22  GLUCOSE 112*  BUN 17  CALCIUM 9.5  CREATININE 1.54*  GFRNONAA 41*  GFRAA 48*    LIVER FUNCTION TESTS: Recent Labs    10/22/20 1100  BILITOT 0.4  AST 24  ALT 19  ALKPHOS 41*  PROT 6.8  ALBUMIN 4.3    TUMOR MARKERS: No results for input(s): AFPTM, CEA, CA199, CHROMGRNA in the last 8760 hours.  Assessment and Plan:   Mr Haynesworth is an 85 yo male with prior AAA repair with EVAR, SP embolization of both the IMA and lumbar arteries, and slowly enlarging excluded aneurysm sac, possibly from persisting type II leak.    The change has been very slow over time, and he remains asymptomatic, with otherwise no concerning features of the CT.    Today, I had another discussion with him regarding the reason for treatment of AAA, the known rate of increased intervention after endovascular repair, and the need for surveillance in these such cases of endoleak.    I reinforced the fact that our data support a very low risk of rupture in this scenario  (EUROSTAR data) certainly less than 2% risk.     I did let him know that endovascular approach for angiogram and possible embolization is employed when there is persisting growth after EVAR, as even though the risk is low, there is a real risk of rupture, which can be catastrophic. At this time, he seems not very motivated for repeat endo approach, and my impression is that continuing to observe him for any interval growth is reasonable.  He agrees with this conservative approach.     He knows that should he have any new abdominal pain or back pain, or any other concerning symptoms, he should come to [facility] ED for care.    Plan: - Follow up office visit in 1 year, with repeat CT scan for surveillance. This can be non-contrast CT scan of abd/pelvis.  - I have advised him to observe his other follow up appointments  Electronically Signed: Corrie Mckusick 07/02/2021, 10:51 AM   I spent a total of    15 Minutes  in remote  clinical consultation, greater than 50% of which was counseling/coordinating care for post EVAR with type II endoleak, possible intervention, surveillance.    Visit type: Audio only (telephone). Audio (no video) only due to patient's lack of internet/smartphone capability. Alternative for in-person consultation at [facility], [address], [place], [place]. This visit type was conducted due to national recommendations for restrictions regarding the COVID-19 Pandemic (e.g. social distancing).  This format is felt to be most appropriate for this patient at this time.  All issues noted in this document were discussed and addressed.

## 2021-07-12 ENCOUNTER — Encounter: Payer: Self-pay | Admitting: Cardiology

## 2021-07-13 ENCOUNTER — Encounter: Payer: Self-pay | Admitting: Cardiology

## 2021-07-13 NOTE — Assessment & Plan Note (Signed)
Stable mild aortic stenosis on echocardiogram.  No need to be checked for another couple years.

## 2021-07-13 NOTE — Assessment & Plan Note (Addendum)
Significant CAD with patent LIMA to LAD and grafts in the RCA and LCx.  Myoview was checked prior to this visit because of his exertional dyspnea and fatigue.  Thankfully, there is no evidence of ischemia, only prior infarction.  Stable findings. He does not actually have anginal symptoms, just fatigue.  Beta-blockers in the longer being used because of fatigue.  Plan:  Continue clopidogrel monotherapy for combination of CAD and PAD  Okay to hold Plavix 5 days preop for surgeries or procedures.  7 days for more high risk procedures.  Not on statin because of myopathies-is only willing to take Zetia and fenofibrate.  I discussed PCSK9 inhibitors in the past along with Nexletol.  Not interested, because of cost  On combination of amlodipine and ranolazine for antianginal benefit.  No change for now.  Continue ARB-HCTZ.

## 2021-07-13 NOTE — Assessment & Plan Note (Signed)
Followed closely by PCP.  Most recent creatinine was 1.54. Continue ARB

## 2021-07-13 NOTE — Assessment & Plan Note (Signed)
Intolerant of despite every statin be tried.  Not willing to take much more than combination of fenofibrate and ezetimibe.  We will try to get him set up for PCSK9 inhibitor, but not interested.

## 2021-07-13 NOTE — Assessment & Plan Note (Signed)
He is indicated that he does not want to take any morning Zetia and fenofibrate.  Last labs were checked in January with an LDL of 124.  Given his advanced age, he is chosen not to take any additional medications for lipids, acknowledging the risk.

## 2021-07-13 NOTE — Assessment & Plan Note (Signed)
Known patent LIMA-LAD with negative RCA and LCx stents.  No ischemia on Myoview.  Only known prior infarct of the inferior-inferolateral wall.  This is confirmed on Myoview in 2013 as well as now in 2022 wall motion abnormality was also noted and echocardiogram.

## 2021-07-13 NOTE — Assessment & Plan Note (Signed)
Stable NYHA class II symptoms.  Not requiring diuretic besides HCTZ along with ARB.  On amlodipine for additional antianginal and blood pressure control as he is off beta-blocker because of fatigue.

## 2021-07-13 NOTE — Assessment & Plan Note (Signed)
NYHA Class II symptoms mostly related to exertional dyspnea which is as much related to his arthritis pains.  Plan: Continue valsartan-HCTZ and amlodipine.  Not beta-blocker because of fatigue. Not on full loop diuretic.

## 2021-07-13 NOTE — Assessment & Plan Note (Signed)
Confirmed on recent Myoview.  Myoview underestimated EF, recheck on echocardiogram with stable with normal wall motion normality.  This time because of the reduced EF and sizable infarct, was considered intermediate risk.  No evidence of ischemia.  No significant active CHF symptoms.

## 2021-07-13 NOTE — Assessment & Plan Note (Signed)
Followed by Dr. Donzetta Matters.  Recent CT abdomen pelvis reviewed.  Have not seen what the plan is.

## 2021-08-03 ENCOUNTER — Other Ambulatory Visit: Payer: Self-pay | Admitting: Cardiology

## 2021-08-16 ENCOUNTER — Other Ambulatory Visit: Payer: Self-pay | Admitting: Cardiology

## 2021-08-19 ENCOUNTER — Emergency Department (HOSPITAL_COMMUNITY): Payer: Medicare Other

## 2021-08-19 ENCOUNTER — Inpatient Hospital Stay (HOSPITAL_COMMUNITY)
Admission: EM | Admit: 2021-08-19 | Discharge: 2021-08-21 | DRG: 246 | Disposition: A | Discharge status: Home / Self Care | Payer: Medicare Other | Attending: Internal Medicine | Admitting: Internal Medicine

## 2021-08-19 ENCOUNTER — Other Ambulatory Visit: Payer: Self-pay

## 2021-08-19 ENCOUNTER — Encounter (HOSPITAL_COMMUNITY): Payer: Self-pay | Admitting: *Deleted

## 2021-08-19 DIAGNOSIS — I200 Unstable angina: Secondary | ICD-10-CM | POA: Diagnosis not present

## 2021-08-19 DIAGNOSIS — I130 Hypertensive heart and chronic kidney disease with heart failure and stage 1 through stage 4 chronic kidney disease, or unspecified chronic kidney disease: Secondary | ICD-10-CM | POA: Diagnosis present

## 2021-08-19 DIAGNOSIS — E119 Type 2 diabetes mellitus without complications: Secondary | ICD-10-CM

## 2021-08-19 DIAGNOSIS — I252 Old myocardial infarction: Secondary | ICD-10-CM

## 2021-08-19 DIAGNOSIS — J45909 Unspecified asthma, uncomplicated: Secondary | ICD-10-CM | POA: Diagnosis present

## 2021-08-19 DIAGNOSIS — Z955 Presence of coronary angioplasty implant and graft: Secondary | ICD-10-CM

## 2021-08-19 DIAGNOSIS — N183 Chronic kidney disease, stage 3 unspecified: Secondary | ICD-10-CM | POA: Diagnosis present

## 2021-08-19 DIAGNOSIS — E11A Type 2 diabetes mellitus without complications in remission: Secondary | ICD-10-CM

## 2021-08-19 DIAGNOSIS — I5032 Chronic diastolic (congestive) heart failure: Secondary | ICD-10-CM | POA: Diagnosis present

## 2021-08-19 DIAGNOSIS — E1122 Type 2 diabetes mellitus with diabetic chronic kidney disease: Secondary | ICD-10-CM | POA: Diagnosis present

## 2021-08-19 DIAGNOSIS — Z87891 Personal history of nicotine dependence: Secondary | ICD-10-CM

## 2021-08-19 DIAGNOSIS — I080 Rheumatic disorders of both mitral and aortic valves: Secondary | ICD-10-CM | POA: Diagnosis present

## 2021-08-19 DIAGNOSIS — Y712 Prosthetic and other implants, materials and accessory cardiovascular devices associated with adverse incidents: Secondary | ICD-10-CM | POA: Diagnosis present

## 2021-08-19 DIAGNOSIS — I214 Non-ST elevation (NSTEMI) myocardial infarction: Secondary | ICD-10-CM

## 2021-08-19 DIAGNOSIS — Z20822 Contact with and (suspected) exposure to covid-19: Secondary | ICD-10-CM | POA: Diagnosis present

## 2021-08-19 DIAGNOSIS — Z86010 Personal history of colonic polyps: Secondary | ICD-10-CM

## 2021-08-19 DIAGNOSIS — Z8261 Family history of arthritis: Secondary | ICD-10-CM

## 2021-08-19 DIAGNOSIS — Z7984 Long term (current) use of oral hypoglycemic drugs: Secondary | ICD-10-CM

## 2021-08-19 DIAGNOSIS — I25700 Atherosclerosis of coronary artery bypass graft(s), unspecified, with unstable angina pectoris: Secondary | ICD-10-CM | POA: Diagnosis present

## 2021-08-19 DIAGNOSIS — N1832 Chronic kidney disease, stage 3b: Secondary | ICD-10-CM

## 2021-08-19 DIAGNOSIS — T82855A Stenosis of coronary artery stent, initial encounter: Secondary | ICD-10-CM | POA: Diagnosis not present

## 2021-08-19 DIAGNOSIS — R079 Chest pain, unspecified: Secondary | ICD-10-CM | POA: Diagnosis not present

## 2021-08-19 DIAGNOSIS — N1831 Chronic kidney disease, stage 3a: Secondary | ICD-10-CM | POA: Diagnosis present

## 2021-08-19 DIAGNOSIS — Z981 Arthrodesis status: Secondary | ICD-10-CM

## 2021-08-19 DIAGNOSIS — E785 Hyperlipidemia, unspecified: Secondary | ICD-10-CM | POA: Diagnosis not present

## 2021-08-19 DIAGNOSIS — I25119 Atherosclerotic heart disease of native coronary artery with unspecified angina pectoris: Secondary | ICD-10-CM | POA: Diagnosis not present

## 2021-08-19 DIAGNOSIS — K219 Gastro-esophageal reflux disease without esophagitis: Secondary | ICD-10-CM | POA: Diagnosis present

## 2021-08-19 DIAGNOSIS — I2582 Chronic total occlusion of coronary artery: Secondary | ICD-10-CM | POA: Diagnosis present

## 2021-08-19 DIAGNOSIS — Z6831 Body mass index (BMI) 31.0-31.9, adult: Secondary | ICD-10-CM

## 2021-08-19 DIAGNOSIS — Z7982 Long term (current) use of aspirin: Secondary | ICD-10-CM

## 2021-08-19 DIAGNOSIS — Z8679 Personal history of other diseases of the circulatory system: Secondary | ICD-10-CM

## 2021-08-19 DIAGNOSIS — I350 Nonrheumatic aortic (valve) stenosis: Secondary | ICD-10-CM

## 2021-08-19 DIAGNOSIS — Z96653 Presence of artificial knee joint, bilateral: Secondary | ICD-10-CM | POA: Diagnosis present

## 2021-08-19 DIAGNOSIS — D631 Anemia in chronic kidney disease: Secondary | ICD-10-CM | POA: Diagnosis present

## 2021-08-19 DIAGNOSIS — Z823 Family history of stroke: Secondary | ICD-10-CM

## 2021-08-19 DIAGNOSIS — Z8249 Family history of ischemic heart disease and other diseases of the circulatory system: Secondary | ICD-10-CM

## 2021-08-19 DIAGNOSIS — Z79899 Other long term (current) drug therapy: Secondary | ICD-10-CM

## 2021-08-19 DIAGNOSIS — I25110 Atherosclerotic heart disease of native coronary artery with unstable angina pectoris: Secondary | ICD-10-CM | POA: Diagnosis present

## 2021-08-19 DIAGNOSIS — Z803 Family history of malignant neoplasm of breast: Secondary | ICD-10-CM

## 2021-08-19 DIAGNOSIS — Z833 Family history of diabetes mellitus: Secondary | ICD-10-CM

## 2021-08-19 DIAGNOSIS — Z9889 Other specified postprocedural states: Secondary | ICD-10-CM

## 2021-08-19 DIAGNOSIS — Z7902 Long term (current) use of antithrombotics/antiplatelets: Secondary | ICD-10-CM

## 2021-08-19 LAB — BASIC METABOLIC PANEL
Anion gap: 9 (ref 5–15)
BUN: 23 mg/dL (ref 8–23)
CO2: 21 mmol/L — ABNORMAL LOW (ref 22–32)
Calcium: 8.8 mg/dL — ABNORMAL LOW (ref 8.9–10.3)
Chloride: 110 mmol/L (ref 98–111)
Creatinine, Ser: 1.63 mg/dL — ABNORMAL HIGH (ref 0.61–1.24)
GFR, Estimated: 42 mL/min — ABNORMAL LOW (ref >60)
Glucose, Bld: 97 mg/dL (ref 70–99)
Potassium: 4.2 mmol/L (ref 3.5–5.1)
Sodium: 140 mmol/L (ref 135–145)

## 2021-08-19 LAB — BRAIN NATRIURETIC PEPTIDE: B Natriuretic Peptide: 119.6 pg/mL — ABNORMAL HIGH (ref 0.0–100.0)

## 2021-08-19 LAB — CBC
HCT: 36.1 % — ABNORMAL LOW (ref 39.0–52.0)
Hemoglobin: 11.6 g/dL — ABNORMAL LOW (ref 13.0–17.0)
MCH: 31.4 pg (ref 26.0–34.0)
MCHC: 32.1 g/dL (ref 30.0–36.0)
MCV: 97.6 fL (ref 80.0–100.0)
Platelets: 190 10*3/uL (ref 150–400)
RBC: 3.7 MIL/uL — ABNORMAL LOW (ref 4.22–5.81)
RDW: 14.7 % (ref 11.5–15.5)
WBC: 6.6 10*3/uL (ref 4.0–10.5)
nRBC: 0 % (ref 0.0–0.2)

## 2021-08-19 LAB — TROPONIN I (HIGH SENSITIVITY)
Troponin I (High Sensitivity): 50 ng/L — ABNORMAL HIGH (ref <18)
Troponin I (High Sensitivity): 67 ng/L — ABNORMAL HIGH (ref <18)

## 2021-08-19 MED ORDER — RANOLAZINE ER 500 MG PO TB12
500.0000 mg | ORAL_TABLET | Freq: Two times a day (BID) | ORAL | Status: DC
  Administered 2021-08-20 – 2021-08-21 (×4): 500 mg via ORAL
  Filled 2021-08-19 (×5): qty 1

## 2021-08-19 MED ORDER — ASPIRIN 81 MG PO CHEW
81.0000 mg | CHEWABLE_TABLET | ORAL | Status: AC
  Administered 2021-08-20: 06:00:00 81 mg via ORAL
  Filled 2021-08-19: qty 1

## 2021-08-19 MED ORDER — HEPARIN (PORCINE) 25000 UT/250ML-% IV SOLN
1400.0000 [IU]/h | INTRAVENOUS | Status: DC
  Administered 2021-08-19: 1200 [IU]/h via INTRAVENOUS
  Filled 2021-08-19 (×2): qty 250

## 2021-08-19 MED ORDER — SODIUM CHLORIDE 0.9% FLUSH: 3.0000 mL | INTRAVENOUS | Status: DC | PRN

## 2021-08-19 MED ORDER — EZETIMIBE 10 MG PO TABS
10.0000 mg | ORAL_TABLET | Freq: Every day | ORAL | Status: DC
  Administered 2021-08-20 – 2021-08-21 (×2): 10 mg via ORAL
  Filled 2021-08-19 (×2): qty 1

## 2021-08-19 MED ORDER — ASPIRIN 81 MG PO CHEW
81.0000 mg | CHEWABLE_TABLET | Freq: Every day | ORAL | Status: DC
  Administered 2021-08-20: 10:00:00 81 mg via ORAL
  Filled 2021-08-19: qty 1

## 2021-08-19 MED ORDER — FENOFIBRATE 160 MG PO TABS
160.0000 mg | ORAL_TABLET | Freq: Every day | ORAL | Status: DC
  Administered 2021-08-20 – 2021-08-21 (×2): 160 mg via ORAL
  Filled 2021-08-19 (×2): qty 1

## 2021-08-19 MED ORDER — HYDROCHLOROTHIAZIDE 12.5 MG PO TABS
12.5000 mg | ORAL_TABLET | Freq: Every day | ORAL | Status: DC
  Administered 2021-08-20 – 2021-08-21 (×2): 12.5 mg via ORAL
  Filled 2021-08-19 (×2): qty 1

## 2021-08-19 MED ORDER — CLOPIDOGREL BISULFATE 75 MG PO TABS
75.0000 mg | ORAL_TABLET | Freq: Every day | ORAL | Status: DC
  Administered 2021-08-20: 09:00:00 75 mg via ORAL
  Filled 2021-08-19: qty 1

## 2021-08-19 MED ORDER — ONDANSETRON HCL 4 MG/2ML IJ SOLN: 4.0000 mg | Freq: Four times a day (QID) | INTRAMUSCULAR | Status: DC | PRN

## 2021-08-19 MED ORDER — ACETAMINOPHEN 325 MG PO TABS: 650.0000 mg | ORAL_TABLET | ORAL | Status: DC | PRN

## 2021-08-19 MED ORDER — HEPARIN BOLUS VIA INFUSION
4000.0000 [IU] | Freq: Once | INTRAVENOUS | Status: AC
  Administered 2021-08-19: 4000 [IU] via INTRAVENOUS
  Filled 2021-08-19: qty 4000

## 2021-08-19 MED ORDER — SODIUM CHLORIDE 0.9% FLUSH
3.0000 mL | Freq: Two times a day (BID) | INTRAVENOUS | Status: DC
  Administered 2021-08-20 (×2): 3 mL via INTRAVENOUS

## 2021-08-19 MED ORDER — VALSARTAN-HYDROCHLOROTHIAZIDE 320-12.5 MG PO TABS: 1.0000 | ORAL_TABLET | Freq: Every day | ORAL | Status: DC

## 2021-08-19 MED ORDER — FERROUS SULFATE 325 (65 FE) MG PO TABS
325.0000 mg | ORAL_TABLET | Freq: Every day | ORAL | Status: DC
  Administered 2021-08-20 – 2021-08-21 (×2): 325 mg via ORAL
  Filled 2021-08-19 (×2): qty 1

## 2021-08-19 MED ORDER — SODIUM CHLORIDE 0.9 % IV SOLN: 250.0000 mL | INTRAVENOUS | Status: DC | PRN

## 2021-08-19 MED ORDER — PANTOPRAZOLE SODIUM 40 MG PO TBEC
40.0000 mg | DELAYED_RELEASE_TABLET | Freq: Every day | ORAL | Status: DC
  Administered 2021-08-20 – 2021-08-21 (×2): 40 mg via ORAL
  Filled 2021-08-19 (×2): qty 1

## 2021-08-19 MED ORDER — AMLODIPINE BESYLATE 5 MG PO TABS
5.0000 mg | ORAL_TABLET | Freq: Every day | ORAL | Status: DC
  Administered 2021-08-20 – 2021-08-21 (×2): 5 mg via ORAL
  Filled 2021-08-19 (×2): qty 1

## 2021-08-19 MED ORDER — IRBESARTAN 150 MG PO TABS
300.0000 mg | ORAL_TABLET | Freq: Every day | ORAL | Status: DC
  Administered 2021-08-20 – 2021-08-21 (×2): 300 mg via ORAL
  Filled 2021-08-19: qty 1
  Filled 2021-08-19: qty 2

## 2021-08-19 MED ORDER — SODIUM CHLORIDE 0.9 % IV SOLN: INTRAVENOUS | Status: DC

## 2021-08-19 NOTE — ED Triage Notes (Addendum)
Pt with L sided chest pain that radiates to l arm while gathering firewood.  Extensive cardiac hx.  Pt took 324 asa.  Not given nitro b/c pain resolved with rest.  Pt states he has been having episodes like this for the past several weeks with increased sob on exertion.  States he called 911 today because the pain was radiating to L arm.  140/70 Hr 60 Cbg 160 Rr 16

## 2021-08-19 NOTE — Progress Notes (Signed)
ANTICOAGULATION CONSULT NOTE - Initial Consult Pharmacy Consult for IV heparin Indication: chest pain/ACS  Allergies  Allergen Reactions   Oxycodone Shortness Of Breath and Cough   Lisinopril Cough   Statins Other (See Comments)    MYALGIAS    Welchol [Colesevelam Hcl] Itching    Patient Measurements: Height: 5\' 9"  (175.3 cm) Weight: 97.5 kg (215 lb) IBW/kg (Calculated) : 70.7 Heparin Dosing Weight: 91.1 kg   Vital Signs: Temp: 98.7 F (37.1 C) (11/16 1520) Temp Source: Oral (11/16 1520) BP: 164/81 (11/16 1800) Pulse Rate: 66 (11/16 1845)  Labs: Recent Labs    08/19/21 1530  HGB 11.6*  HCT 36.1*  PLT 190  CREATININE 1.63*  TROPONINIHS 50*    Estimated Creatinine Clearance: 39.5 mL/min (A) (by C-G formula based on SCr of 1.63 mg/dL (H)).   Medical History: Past Medical History:  Diagnosis Date   Adenomatous colon polyp    Ankle edema    Chronic   Asthma    CAD S/P percutaneous coronary angioplasty 2004   a) '04: Staged Taxus DES PCI to RCA (2 Taxus 2.75 x 32 & 12) and Cx-OM (Taxus 3 x 20);;'07 - PCI to pRCA ISR- Cypher DES; c) 05/2014: pPCI pRCA stentISR (Promus DES 3 x 8) OM1 distal to stent (Promus DES 2.75 x 12 - 3.1)   CAD, multiple vessel 1985   Most recent July 2018: Admitted for non-STEMI. Occluded LAD with patent LIMA (SP1 now occluded).  Patent stents in proximal and mid RCA as well as circumflex-OM 3. Known occlusion of SVG-OM. EF 45-50%   CHF (congestive heart failure) ([place])    Cholelithiasis - with cholangitis & choledocholithiasis    status post ERCP with removal of calculi and biliary stent placement.   Chronic anemia    On iron supplement; history of positive guaiac - negative colonoscopy in 1996.; Thought to be related to hemorrhoids; status post hemorrhoidectomy   Chronic back pain     multiple surgeries; C-spine and lumbar   Diabetes mellitus    on oral medication   Diverticulitis of colon 1996   Diverticulosis    Dyslipidemia, goal LDL  below 70    Erectile dysfunction    Exertional dyspnea    Chronic baseline SOB with ambulation   GERD (gastroesophageal reflux disease)    Grade II diastolic dysfunction    H/O: pneumonia 11/2012   Hemorrhoids    Hiatal hernia    History of: ST elevation myocardial infarction (STEMI) involving left circumflex coronary artery with complication 1941   PTCA-circumflex; PCI in 1991   Hypertension    Moderate aortic stenosis by prior echocardiogram 07/2015   Normal LV function - EF 55-60%.. Abnormal relaxation. Mild-moderate aortic stenosis (peak/mean gradient 18/10 mmH)   Osteoarthritis of both knees    And back; multiple back surgeries, right knee arthroplasty and left knee arthroscopic surgery x2   Pneumonia 2016   S/P AAA (abdominal aortic aneurysm) repair 08/30/2012   s/p EVAR   S/P CABG x 2 1997   LIMA-LAD, SVG-OM   Sleep apnea    pt. states he was told to return for f/u, to be fitted for Cpap, but pt. reports that he didn't follow up-no cpap used   Statin myopathy 09/30/2015    Assessment: [name] presented on 11/16 with chest pain and previous cardiac history. No PTA anticoagulation. Hgb 11.6 and plt 190. Pharmacy consulted to start IV heparin.   Goal of Therapy:  Heparin level 0.3-0.7 units/ml Monitor platelets by anticoagulation  protocol: Yes   Plan:  Heparin IV bolus 4000 units Heparin IV 1200 units/hr 8h Heparin level  Daily heparin level and CBC   Thank you for allowing pharmacy to participate in this patient's care.  [name], PharmD PGY1 Acute Care Resident  08/19/2021,7:17 PM

## 2021-08-19 NOTE — ED Notes (Signed)
Received verbal report from Brenda M RN at this time 

## 2021-08-19 NOTE — ED Provider Notes (Signed)
[facility] EMERGENCY DEPARTMENT Provider Note   CSN: [id number] Arrival date & time: 08/19/21  1502     History Chief Complaint  Patient presents with   Chest Pain    [name] is a 85 y.o. male.  HPI  85 year old male with past medical history of CAD, STEMI, CABG, HTN, HLD, aortic stenosis, AAA with previous EVAR, DM presents emergency department with exertional chest pain or shortness of breath.  Patient has chronic exertional shortness of breath at baseline, follows with Dr. Ellyn Hack for cardiology.  However he states over his past couple episodes has been associated with chest heaviness and today while he was moving firewood he had intense chest heaviness that radiated to both arms.  This was more severe than previous.  Took a couple minutes after rest to resolve.  He took aspirin prior to arrival.  Never took any nitroglycerin because the symptoms resolved with rest.  Recently had an echo and stress test in August and September of 2022, mentioned to be stable by the patient and spouse.  Currently patient is symptom-free.  He has mild swelling of his lower extremities at baseline with no acute change.  No recent fever, cough or other acute illness.  Past Medical History:  Diagnosis Date   Adenomatous colon polyp    Ankle edema    Chronic   Asthma    CAD S/P percutaneous coronary angioplasty 2004   a) '04: Staged Taxus DES PCI to RCA (2 Taxus 2.75 x 32 & 12) and Cx-OM (Taxus 3 x 20);;'07 - PCI to pRCA ISR- Cypher DES; c) 05/2014: pPCI pRCA stentISR (Promus DES 3 x 8) OM1 distal to stent (Promus DES 2.75 x 12 - 3.1)   CAD, multiple vessel 1985   Most recent July 2018: Admitted for non-STEMI. Occluded LAD with patent LIMA (SP1 now occluded).  Patent stents in proximal and mid RCA as well as circumflex-OM 3. Known occlusion of SVG-OM. EF 45-50%   CHF (congestive heart failure) ([place])    Cholelithiasis - with cholangitis & choledocholithiasis    status post ERCP  with removal of calculi and biliary stent placement.   Chronic anemia    On iron supplement; history of positive guaiac - negative colonoscopy in 1996.; Thought to be related to hemorrhoids; status post hemorrhoidectomy   Chronic back pain     multiple surgeries; C-spine and lumbar   Diabetes mellitus    on oral medication   Diverticulitis of colon 1996   Diverticulosis    Dyslipidemia, goal LDL below 70    Erectile dysfunction    Exertional dyspnea    Chronic baseline SOB with ambulation   GERD (gastroesophageal reflux disease)    Grade II diastolic dysfunction    H/O: pneumonia 11/2012   Hemorrhoids    Hiatal hernia    History of: ST elevation myocardial infarction (STEMI) involving left circumflex coronary artery with complication 5465   PTCA-circumflex; PCI in 1991   Hypertension    Moderate aortic stenosis by prior echocardiogram 07/2015   Normal LV function - EF 55-60%.. Abnormal relaxation. Mild-moderate aortic stenosis (peak/mean gradient 18/10 mmH)   Osteoarthritis of both knees    And back; multiple back surgeries, right knee arthroplasty and left knee arthroscopic surgery x2   Pneumonia 2016   S/P AAA (abdominal aortic aneurysm) repair 08/30/2012   s/p EVAR   S/P CABG x 2 1997   LIMA-LAD, SVG-OM   Sleep apnea    pt. states he was told  to return for f/u, to be fitted for Cpap, but pt. reports that he didn't follow up-no cpap used   Statin myopathy 09/30/2015    Patient Active Problem List   Diagnosis Date Noted   Aortic atherosclerosis ([place]) 10/22/2020   Allergic rhinitis 06/12/2020   Carpal tunnel syndrome, right upper limb 04/30/2019   Carpal tunnel syndrome, left upper limb 04/30/2019   Senile purpura ([place]) 07/20/2017   S/P total knee replacement 12/20/2016   Chronic pain of left knee 10/19/2016   Status post endovascular aneurysm repair (EVAR) 03/16/2016   Mild aortic stenosis by prior echocardiogram 02/02/2016   Statin myopathy 09/30/2015   Calculus of  bile duct with obstruction and without cholangitis or cholecystitis    Presence of drug coated stent in right coronary artery and circumflex coronary artery 01/08/2015   Chronic renal insufficiency, stage III (moderate) ([place]) 06/06/2014   Coronary stent restenosis with uncertain cause: Required PCI for ISR of OM1 (Promus P 2.75 mm x 12 mm) & pRCA 05/17/2014   Obesity (BMI 30-39.9) 06/28/2013   Encounter for long-term (current) use of medications 06/28/2013   ACE-inhibitor cough 04/16/2013   Chronic diastolic CHF (congestive heart failure) ([place]) 11/24/2012   Type 2 diabetes mellitus in remission ([place]) 03/04/2011    Class: Diagnosis of   Hypertensive heart disease with chronic diastolic congestive heart failure ([place]) 03/04/2011   Hyperlipidemia LDL goal <70; statin intolerant 03/04/2011    Class: Diagnosis of   ED (erectile dysfunction) of organic origin 03/04/2011   Gastroesophageal reflux disease 03/04/2011   S/P CABG x 2: 1997. SVG-OM (known to be occluded), LIMA-LAD 06/28/1996   History of ST elevation myocardial infarction (STEMI) of inferolateral wall (PTCA - 100% large lateral OM) 06/28/1996    Class: History of   CAD S/P CABG '95- several PCis since, last 05/17/14 06/28/1994    Class: History of    Past Surgical History:  Procedure Laterality Date   Abdominal and Lower Extremity Arterial Ultrasound  08/23/2012; 10/10/2013   Normal ABIs. Nonocclusive lower extremity disease. 4.2 cm x 4.3 cm infrarenal AAA;; 4.4 cm x 4.3 cm (essentially stable)    ABDOMINAL AORTIC ENDOVASCULAR STENT GRAFT N/A 08/13/2015   Procedure: ABDOMINAL AORTIC ENDOVASCULAR STENT GRAFT;  Surgeon: Mal Misty, MD;  Location[facility];  Service: Vascular;  Laterality: N/A;   Anterior cervical plating  04/23/2010   At C4-5 and a C6-7 utilizing two separate Biomet MaxAn plates.   ANTERIOR LAT LUMBAR FUSION Left 11/22/2012   Procedure: ANTERIOR LATERAL LUMBAR FUSION 1 LEVEL;  Surgeon: Eustace Moore, MD;  Location:  [place] [facility] ORS;  Service: Neurosurgery;  Laterality: Left;  Anterior Lateral Lumbar Fusion Lumbar Three-Four   BACK SURGERY  1979 & x 10   pt. remarks, "I have had about 10 back surgeries"   BILIARY STENT PLACEMENT N/A 07/03/2014   Procedure: BILIARY STENT PLACEMENT;  Surgeon: Inda Castle, MD;  Location: [place];  Service: Endoscopy;  Laterality: N/A;   CARDIAC CATHETERIZATION  06/2003   None Occluded vein graft to OM; diffuse RCA disease in the mid vessel, 80% circumflex-OM stenosis; follow on AV groove circumflex with sequential 90% stenoses and intervening saccular dilation    CARDIAC CATHETERIZATION  12/2008   4 abnormal Myoview showing apical thinning (possibly due to apical LAD 95%) : 100% Occluded LAD after SV1, distal LAD grafted via LIMA -apical 95% . Cx -OM1 w/patent stent extending into OM 1 . Follow on Cx - 70-80% - non-amenable PCI. RCA widely  patent 3 overlapping stents in mRCA w/less than 40% stenosisin RPL; SVG-OM known occluded    CARPAL TUNNEL RELEASE Left 11/16/2019   Procedure: LEFT CARPAL TUNNEL RELEASE;  Surgeon: Mcarthur Rossetti, MD;  Location: [facility] ORS;  Service: Orthopedics;  Laterality: Left;   CATARACT EXTRACTION     Cervical arthrodesis  04/23/2010   Anterior cervical arthrodesis, C4-5, C6-7 utilizing 7-mm PEEK interbody cage packed with local autograft & Antifuse putty at C4-5 & an 8-mm cage at C6-7.   CERVICAL DISCECTOMY  04/23/2010   Decompressive anterior carvical diskectomy. C4-5, C6-7   CHOLECYSTECTOMY N/A 05/23/2015   Procedure: LAPAROSCOPIC CHOLECYSTECTOMY WITH INTRAOPERATIVE CHOLANGIOGRAM;  Surgeon: Alphonsa Overall, MD;  Location: [facility] ORS;  Service: General;  Laterality: N/A;   [place]  1985,1991,15   1985 lateral STEMI Circumflex PTCA;    CORONARY ARTERY BYPASS GRAFT  1997   LIMA-LAD, SVG-OM (SVG known to be occluded prior to 2004)   ERCP N/A 07/03/2014   Procedure: ENDOSCOPIC RETROGRADE CHOLANGIOPANCREATOGRAPHY  (ERCP);  Surgeon: Inda Castle, MD;  Location: [place];  Service: Endoscopy;  Laterality: N/A;   ERCP N/A 07/05/2014   Procedure: ENDOSCOPIC RETROGRADE CHOLANGIOPANCREATOGRAPHY (ERCP);  Surgeon: Inda Castle, MD;  Location: [place];  Service: Endoscopy;  Laterality: N/A;   ERCP N/A 06/30/2015   Procedure: ENDOSCOPIC RETROGRADE CHOLANGIOPANCREATOGRAPHY (ERCP);  Surgeon: Ladene Artist, MD;  Location: [facility];  Service: Endoscopy;  Laterality: N/A;   EYE SURGERY     IR ANGIOGRAM EXTREMITY LEFT  01/18/2017   IR ANGIOGRAM PELVIS SELECTIVE OR SUPRASELECTIVE  01/18/2017   IR ANGIOGRAM SELECTIVE EACH ADDITIONAL VESSEL  01/18/2017   IR AORTAGRAM ABDOMINAL SERIALOGRAM  01/18/2017   IR EMBO ARTERIAL NOT HEMORR HEMANG INC GUIDE ROADMAPPING  01/18/2017   IR RADIOLOGIST EVAL & MGMT  01/04/2017   IR RADIOLOGIST EVAL & MGMT  10/26/2017   IR RADIOLOGIST EVAL & MGMT  05/24/2018   IR RADIOLOGIST EVAL & MGMT  06/19/2019   IR RADIOLOGIST EVAL & MGMT  06/11/2020   IR RADIOLOGIST EVAL & MGMT  07/02/2021   IR US GUIDE VASC ACCESS RIGHT  01/18/2017   KNEE ARTHROSCOPY Left    x 2   LEFT HEART CATH AND CORS/GRAFTS ANGIOGRAPHY N/A 04/20/2017   Procedure: Left Heart Cath and Cors/Grafts Angiography;  Surgeon: Lorretta Harp, MD;  Location: [facility] CV LAB;  LAD now occluded prior to SP1.LIMA-LAD. Patent RCA and circumflex stents. EF 45-50%. Relatively stable.    LEFT HEART CATHETERIZATION WITH CORONARY ANGIOGRAM N/A 05/15/2014   Procedure: LEFT HEART CATHETERIZATION WITH CORONARY ANGIOGRAM;  Surgeon: Sinclair Grooms, MD;  [facility] C[facility] LAB;  Service: Cardiovascular;  Laterality: N/A;   LEFT HEART CATHETERIZATION WITH CORONARY/GRAFT ANGIOGRAM N/A 06/28/2014   Procedure: LEFT HEART CATHETERIZATION WITH Beatrix Fetters;  Surgeon: Troy Sine, MD;  Location: [facility] CATH LAB;  Service: Cardiovascular;  Laterality: N/A;   LUMBAR PERCUTANEOUS PEDICLE SCREW 1 LEVEL N/A 11/22/2012    Procedure: LUMBAR PERCUTANEOUS PEDICLE SCREW 1 LEVEL;  Surgeon: Eustace Moore, MD;  Location: [place] NEUR[facility] ORS;  Service: Neurosurgery;  Laterality: N/A;  Lumbar Three-Four Percutaneous Pedicle Screw, Lateral approach   MINOR HEMORRHOIDECTOMY     NM MYOVIEW LTD  05/26/2021   Lexiscan: Intermediate risk (similar findings to last study), with exception of reduced EF roughly 40%..  Prior basal inferior-inferolateral infarct without peri-infarct ischemia.   ->  Echo ordered -> (EF 45 to 50%)-when compared to prior Myoview, infarct appears  similar.   PERCUTANEOUS CORONARY STENT INTERVENTION (PCI-S)  06/2003   PCI - RCA 2 overlapping Taxus DES 2.75 mm x 32 mm and 2.75 mm x 12 mm (3.0 mm); PCI-Cx-OM1 - Taxus DES 3.0 mm x 20 mm (3.1 mm);    PERCUTANEOUS CORONARY STENT INTERVENTION (PCI-S)  12/2005   80% ISR in proximal Taxus stent in RCA -- covered proximally with Cypher DES 3.0 mm x 12 mm   PERCUTANEOUS CORONARY STENT INTERVENTION (PCI-S) N/A 05/17/2014   Procedure: PERCUTANEOUS CORONARY STENT INTERVENTION (PCI-S);  Surgeon: Sinclair Grooms, MD;  Location: [facility] CATH LAB: PCI pRCA stent ISR - Promus DES 3.0 x 8 (3.25), OM distal stent edge - Promus DES 2.75 x 12 (3.1)   PERIPHERAL VASCULAR CATHETERIZATION  11/03/2016   Procedure: Embolization;  Surgeon: Waynetta Sandy, MD;  Location: [place] CV LAB;  Service: Cardiovascular;;   POSTERIOR CERVICAL FUSION/FORAMINOTOMY N/A 05/02/2013   Procedure: POSTERIOR CERVICAL FUSION/FORAMINOTOMY CERVICAL SEVEN THORACIC-ONE;  Surgeon: Eustace Moore, MD;  Location: [place] [facility] ORS;  Service: Neurosurgery;  Laterality: N/A;  POSTERIOR CERVICAL FUSION/FORAMINOTOMY CERVICAL SEVEN THORACIC-ONE   SPHINCTEROTOMY N/A 06/30/2015   Procedure: SPHINCTEROTOMY;  Surgeon: Ladene Artist, MD;  Location: [facility] ENDOSCOPY;  Service: Endoscopy;  Laterality: N/A;   TOTAL KNEE ARTHROPLASTY Right    TOTAL KNEE ARTHROPLASTY Left 12/20/2016   Procedure: LEFT TOTAL KNEE ARTHROPLASTY;   Surgeon: Vickey Huger, MD;  Location: [place];  Service: Orthopedics;  Laterality: Left;   TRANSTHORACIC ECHOCARDIOGRAM  06/10/2021   EF 45 to 50%.  Mildly reduced function.  Mild hypokinesis of basal-mid inferior and inferolateral wall consistent with prior infarct.  Severe LA dilation.  Mild to moderate MR with moderate MAC.  Mild calcific AS (mean gradient 11 mmHg) --> LV function seems unchanged.  Diastolic function improved.  Wall motion abnormality unchanged.  AS unchanged.   TRANSTHORACIC ECHOCARDIOGRAM  07/2018   Normal LV size.  EF 50 to 55% with mild HK of inferolateral wall.  GRII DD.  Mild AS with mean gradient 15 mm or greater.  Mild ascending aortic dilation.  Mildly increased PA pressures of 41 mmHg   UPPER GI ENDOSCOPY  07/03/2019   VISCERAL ANGIOGRAM  11/03/2016   Procedure: Visceral Angiogram;  Surgeon: Waynetta Sandy, MD;  Location: [place] CV LAB;  Service: Cardiovascular;;       Family History  Problem Relation Age of Onset   Arthritis Mother    Diabetes Father    Heart disease Father    Stroke Sister    Hypertension Sister    Heart disease Sister    Diabetes Sister    Breast cancer Sister    Heart disease Brother    Ulcers Brother    Colon cancer Neg Hx     Social History   Tobacco Use   Smoking status: Former    Packs/day: 3.00    Years: 45.00    Pack years: 135.00    Types: Cigarettes    Quit date: 10/05/1983    Years since quitting: 37.8   Smokeless tobacco: Never  Vaping Use   Vaping Use: Never used  Substance Use Topics   Alcohol use: Yes    Alcohol/week: 4.0 standard drinks    Types: 4 Glasses of wine per week    Comment: 2 or 3 beers per day and more on the weekend   Drug use: No    Home Medications Prior to Admission medications   Medication Sig Start Date End Date  Taking? Authorizing Provider  acetaminophen (TYLENOL) 500 MG tablet Take 1,000 mg by mouth every 6 (six) hours as needed for mild pain, moderate pain or headache.  Reported on 03/16/2016    [provider]  amLODipine (NORVASC) 5 MG tablet Take 1 tablet by mouth once daily 08/03/21   Leonie Man, MD  aspirin EC 81 MG EC tablet Take 1 tablet (81 mg total) by mouth daily. Patient not taking: Reported on 06/26/2021 04/23/17   Daune Perch, NP  Choline Fenofibrate (FENOFIBRIC ACID) 135 MG CPDR TAKE ONE CAPSULE BY MOUTH EVERY DAY 06/09/21   Leonie Man, MD  clopidogrel (PLAVIX) 75 MG tablet Take 1 tablet by mouth once daily 06/09/21   Leonie Man, MD  ezetimibe (ZETIA) 10 MG tablet Take 1 tablet by mouth once daily 08/17/21   Leonie Man, MD  Ferrous Sulfate (IRON PO) Take 65 mg by mouth.    [provider]  glucose blood (ONETOUCH VERIO) test strip USE ONE STRIP TO CHECK GLUCOSE TWICE DAILY E11.9 Patient not taking: Reported on 06/26/2021 06/15/17   Denita Lung, MD  metFORMIN (GLUCOPHAGE) 500 MG tablet Take 1 tablet by mouth once daily with breakfast Patient not taking: Reported on 06/26/2021 12/07/19   Denita Lung, MD  Multiple Vitamins-Minerals (MULTIVITAMIN WITH MINERALS) tablet Take 1 tablet by mouth daily.    [provider]  pantoprazole (PROTONIX) 40 MG tablet Take 1 tablet by mouth once daily 06/22/21   Leonie Man, MD  ranolazine (RANEXA) 500 MG 12 hr tablet Take 1 tablet by mouth twice daily 12/02/20   Leonie Man, MD  sildenafil (REVATIO) 20 MG tablet TAKE 2-5 TABLETS BY MOUTH AS NEEDED FOR SEXUAL ACTIVITY Patient not taking: Reported on 06/26/2021 11/28/17   Denita Lung, MD  sildenafil (VIAGRA) 100 MG tablet TAKE ONE TABLET BY MOUTH DAILY AS NEEDED FOR ERECTILE DYSFUNCTION 01/07/21   Denita Lung, MD  triamcinolone (KENALOG) 0.1 % apply bid to arms prn itching Patient not taking: Reported on 06/26/2021 07/07/20   [provider]  valsartan-hydrochlorothiazide (DIOVAN HCT) 320-12.5 MG tablet Take 1 tablet by mouth daily. 10/29/20   Denita Lung, MD    Allergies    Oxycodone,  Lisinopril, Statins, and Welchol [colesevelam hcl]  Review of Systems   Review of Systems  Constitutional:  Positive for fatigue. Negative for chills and fever.  HENT:  Negative for congestion.   Eyes:  Negative for visual disturbance.  Respiratory:  Positive for chest tightness and shortness of breath.   Cardiovascular:  Positive for chest pain and leg swelling. Negative for palpitations.  Gastrointestinal:  Negative for abdominal pain, diarrhea and vomiting.  Genitourinary:  Negative for dysuria.  Skin:  Negative for rash.  Neurological:  Negative for headaches.   Physical Exam Updated Vital Signs BP (!) 159/63   Pulse 71   Temp 98.7 F (37.1 C) (Oral)   Resp (!) 23   Ht _0  (1.753 m)   Wt 97.5 kg   SpO2 98%   BMI 31.75 kg/m   Physical Exam Vitals and nursing note reviewed.  Constitutional:      General: He is not in acute distress.    Appearance: Normal appearance. He is not diaphoretic.  HENT:     Head: Normocephalic.     Mouth/Throat:     Mouth: Mucous membranes are moist.  Cardiovascular:     Rate and Rhythm: Normal rate.  Pulmonary:     Effort:  Pulmonary effort is normal. No respiratory distress.     Breath sounds: Examination of the right-lower field reveals decreased breath sounds. Examination of the left-lower field reveals decreased breath sounds. Decreased breath sounds present.  Chest:     Chest wall: No tenderness or crepitus.  Abdominal:     Palpations: Abdomen is soft.     Tenderness: There is no abdominal tenderness.  Musculoskeletal:     Right lower leg: Edema present.     Left lower leg: Edema present.  Skin:    General: Skin is warm.  Neurological:     Mental Status: He is alert and oriented to person, place, and time. Mental status is at baseline.  Psychiatric:        Mood and Affect: Mood normal.    ED Results / Procedures / Treatments   Labs (all labs ordered are listed, but only abnormal results are displayed) Labs Reviewed  BASIC  METABOLIC PANEL  CBC  BRAIN NATRIURETIC PEPTIDE  TROPONIN I (HIGH SENSITIVITY)    EKG EKG Interpretation  Date/Time:  Wednesday August 19 2021 15:33:02 EST Ventricular Rate:  74 PR Interval:  249 QRS Duration: 112 QT Interval:  385 QTC Calculation: 428 R Axis:   -14 Text Interpretation: Sinus rhythm Prolonged PR interval LVH with IVCD and secondary repol abnrm Inferior infarct, age indeterminate T wave inversions inferior lateral Confirmed by Lavenia Atlas [phone]) on 08/19/2021 4:04:33 PM  Radiology No results found.  Procedures Procedures   Medications Ordered in ED Medications - No data to display  ED Course  I have reviewed the triage vital signs and the nursing notes.  Pertinent labs & imaging results that were available during my care of the patient were reviewed by me and considered in my medical decision making (see chart for details).    MDM Rules/Calculators/A&P                           85 year old male presents emergency department with exertional chest pain and shortness of breath.  Resolved with rest, took aspirin prior to arrival, did not require nitroglycerin.  Currently he is asymptomatic.  Mild hypertension on arrival.  EKG shows sinus rhythm with T wave inversions in the inferior lateral leads.  He follows with Dr. Ellyn Hack for cardiology.  Stress test and echo from August/September 2022 show intermediate risk and slightly reduced ejection fracture.  Patient is pending cardiac work-up, concern for ACS. Doubt PE or dissection.  Given that he has high risk with worsening exertional symptoms, decreased EF and T wave changes on EKG he may benefit from cardiology consult and possible admission more emergent cardiac testing.  Patient signed out to oncoming provider.  Final Clinical Impression(s) / ED Diagnoses Final diagnoses:  None    Rx / DC Orders ED Discharge Orders     None        Lorelle Gibbs, DO 08/19/21 1610

## 2021-08-19 NOTE — ED Provider Notes (Signed)
6:15 PM Patient in no distress, commented by granddaughter.  We discussed findings thus far and I discussed his case with our cardiology colleague, Dr. Martinique.  With consideration of unstable angina, and in light of the patient's substantial medical history will be admitted for further monitoring, management.  Given the after mentioned multiple medical issues patient will be admitted to the internal medicine team with cardiology following as a consulting service.   Carmin Muskrat, MD 08/19/21 1816

## 2021-08-19 NOTE — H&P (Signed)
History and Physical    [name] YJW:[id number] DOB: 11/06/1935 DOA: 08/19/2021  PCP: Denita Lung, MD  Patient coming from: Home, daughter at bedside  I have personally briefly reviewed patient's old medical records in [place]  Chief Complaint: Chest pain  HPI: [name] is a 85 y.o. male with medical history significant for CAD s/p CABG, AAA s/p EVAR, PAD, chronic diastolic heart failure, type 2 diabetes, hyperlipidemia, CKD stage III who presents with concerns of chest pain.  Patient noticed chest pain yesterday when he was walking to the mailbox.  Pain is across his anterior chest and it subsided about 10 minutes after rest.  It reoccurred today about 1 PM while he was moving firewood upstairs at home.  Notes again anterior chest pain with radiation down both arms.  Pain subsided after 10 to 15 minutes at rest.  He took 4 baby aspirin's at home.  He also is on daily Plavix.  Denies any associated acute worsening shortness of breath but has chronic exertional dyspnea.  No diaphoresis.  No nausea or vomiting.  Patient is currently chest pain-free.  ED Course: He was afebrile, mildly hypertensive with blood pressure of 164/81.  Initial troponin of 50.  EKG on my review with nonspecific T wave inversion to V4 through V6.  BNP of 119.  CBC unremarkable.  Sodium of 140, K of 4.2, creatinine of 1.63, glucose of 97.  Negative chest x-ray.  ED physician Dr. Vanita Panda consulted cardiologist Dr. Martinique who will evaluate patient at bedside. Hospitalist then called for admission.  Review of Systems: Constitutional: No Weight Change, No Fever ENT/Mouth: No sore throat, No Rhinorrhea Eyes: No Eye Pain, No Vision Changes Cardiovascular: +Chest Pain, no SOB, No PND, + Dyspnea on Exertion, No Orthopnea, No Claudication, No Edema, No Palpitations Respiratory: No Cough, No Sputum Gastrointestinal: No Nausea, No Vomiting, No Diarrhea, No Constipation, No Pain Genitourinary:  no Urinary Incontinence Musculoskeletal: No Arthralgias, No Myalgias Skin: No Skin Lesions, No Pruritus, Neuro: no Weakness, No Numbness Psych: No Anxiety/Panic, No Depression, no decrease appetite Heme/Lymph: No Bruising, No Bleeding  Social Patient lives with his wife.  He is very independent with ADLs but mostly sedentary at home.  Quit tobacco use back in 1985.  Drinks occasional alcohol.  Denies recreational drugs.  Past Medical History:  Diagnosis Date   Adenomatous colon polyp    Ankle edema    Chronic   Asthma    CAD S/P percutaneous coronary angioplasty 2004   a) '04: Staged Taxus DES PCI to RCA (2 Taxus 2.75 x 32 & 12) and Cx-OM (Taxus 3 x 20);;'07 - PCI to pRCA ISR- Cypher DES; c) 05/2014: pPCI pRCA stentISR (Promus DES 3 x 8) OM1 distal to stent (Promus DES 2.75 x 12 - 3.1)   CAD, multiple vessel 1985   Most recent July 2018: Admitted for non-STEMI. Occluded LAD with patent LIMA (SP1 now occluded).  Patent stents in proximal and mid RCA as well as circumflex-OM 3. Known occlusion of SVG-OM. EF 45-50%   CHF (congestive heart failure) ([place])    Cholelithiasis - with cholangitis & choledocholithiasis    status post ERCP with removal of calculi and biliary stent placement.   Chronic anemia    On iron supplement; history of positive guaiac - negative colonoscopy in 1996.; Thought to be related to hemorrhoids; status post hemorrhoidectomy   Chronic back pain     multiple surgeries; C-spine and lumbar   Diabetes mellitus  on oral medication   Diverticulitis of colon 1996   Diverticulosis    Dyslipidemia, goal LDL below 70    Erectile dysfunction    Exertional dyspnea    Chronic baseline SOB with ambulation   GERD (gastroesophageal reflux disease)    Grade II diastolic dysfunction    H/O: pneumonia 11/2012   Hemorrhoids    Hiatal hernia    History of: ST elevation myocardial infarction (STEMI) involving left circumflex coronary artery with complication 6503    PTCA-circumflex; PCI in 1991   Hypertension    Moderate aortic stenosis by prior echocardiogram 07/2015   Normal LV function - EF 55-60%.. Abnormal relaxation. Mild-moderate aortic stenosis (peak/mean gradient 18/10 mmH)   Osteoarthritis of both knees    And back; multiple back surgeries, right knee arthroplasty and left knee arthroscopic surgery x2   Pneumonia 2016   S/P AAA (abdominal aortic aneurysm) repair 08/30/2012   s/p EVAR   S/P CABG x 2 1997   LIMA-LAD, SVG-OM   Sleep apnea    pt. states he was told to return for f/u, to be fitted for Cpap, but pt. reports that he didn't follow up-no cpap used   Statin myopathy 09/30/2015    Past Surgical History:  Procedure Laterality Date   Abdominal and Lower Extremity Arterial Ultrasound  08/23/2012; 10/10/2013   Normal ABIs. Nonocclusive lower extremity disease. 4.2 cm x 4.3 cm infrarenal AAA;; 4.4 cm x 4.3 cm (essentially stable)    ABDOMINAL AORTIC ENDOVASCULAR STENT GRAFT N/A 08/13/2015   Procedure: ABDOMINAL AORTIC ENDOVASCULAR STENT GRAFT;  Surgeon: Mal Misty, MD;  Location: [facility] OR;  Service: Vascular;  Laterality: N/A;   Anterior cervical plating  04/23/2010   At C4-5 and a C6-7 utilizing two separate Biomet MaxAn plates.   ANTERIOR LAT LUMBAR FUSION Left 11/22/2012   Procedure: ANTERIOR LATERAL LUMBAR FUSION 1 LEVEL;  Surgeon: Eustace Moore, MD;  Location: [facility];  Service: Neurosurgery;  Laterality: Left;  Anterior Lateral Lumbar Fusion Lumbar Three-Four   BACK SURGERY  1979 & x 10   pt. remarks, "I have had about 10 back surgeries"   BILIARY STENT PLACEMENT N/A 07/03/2014   Procedure: BILIARY STENT PLACEMENT;  Surgeon: Inda Castle, MD;  Location: [place];  Service: Endoscopy;  Laterality: N/A;   CARDIAC CATHETERIZATION  06/2003   None Occluded vein graft to OM; diffuse RCA disease in the mid vessel, 80% circumflex-OM stenosis; follow on AV groove circumflex with sequential 90% stenoses and intervening saccular  dilation    CARDIAC CATHETERIZATION  12/2008   4 abnormal Myoview showing apical thinning (possibly due to apical LAD 95%) : 100% Occluded LAD after SV1, distal LAD grafted via LIMA -apical 95% . Cx -OM1 w/patent stent extending into OM 1 . Follow on Cx - 70-80% - non-amenable PCI. RCA widely patent 3 overlapping stents in mRCA w/less than 40% stenosisin RPL; SVG-OM known occluded    CARPAL TUNNEL RELEASE Left 11/16/2019   Procedure: LEFT CARPAL TUNNEL RELEASE;  Surgeon: Mcarthur Rossetti, MD;  Location: [facility];  Service: Orthopedics;  Laterality: Left;   CATARACT EXTRACTION     Cervical arthrodesis  04/23/2010   Anterior cervical arthrodesis, C4-5, C6-7 utilizing 7-mm PEEK interbody cage packed with local autograft & Antifuse putty at C4-5 & an 8-mm cage at C6-7.   CERVICAL DISCECTOMY  04/23/2010   Decompressive anterior carvical diskectomy. C4-5, C6-7   CHOLECYSTECTOMY N/A 05/23/2015   Procedure: LAPAROSCOPIC CHOLECYSTECTOMY WITH INTRAOPERATIVE CHOLANGIOGRAM;  Surgeon: Shanon Brow  Lucia Gaskins, MD;  Location: [facility];  Service: General;  Laterality: N/A;   [place]  1985,1991,15   1985 lateral STEMI Circumflex PTCA;    CORONARY ARTERY BYPASS GRAFT  1997   LIMA-LAD, SVG-OM (SVG known to be occluded prior to 2004)   ERCP N/A 07/03/2014   Procedure: ENDOSCOPIC RETROGRADE CHOLANGIOPANCREATOGRAPHY (ERCP);  Surgeon: Inda Castle, MD;  Location: [place];  Service: Endoscopy;  Laterality: N/A;   ERCP N/A 07/05/2014   Procedure: ENDOSCOPIC RETROGRADE CHOLANGIOPANCREATOGRAPHY (ERCP);  Surgeon: Inda Castle, MD;  Location: [place];  Service: Endoscopy;  Laterality: N/A;   ERCP N/A 06/30/2015   Procedure: ENDOSCOPIC RETROGRADE CHOLANGIOPANCREATOGRAPHY (ERCP);  Surgeon: Ladene Artist, MD;  Location: [facility];  Service: Endoscopy;  Laterality: N/A;   EYE SURGERY     IR ANGIOGRAM EXTREMITY LEFT  01/18/2017   IR ANGIOGRAM PELVIS SELECTIVE OR SUPRASELECTIVE   01/18/2017   IR ANGIOGRAM SELECTIVE EACH ADDITIONAL VESSEL  01/18/2017   IR AORTAGRAM ABDOMINAL SERIALOGRAM  01/18/2017   IR EMBO ARTERIAL NOT HEMORR HEMANG INC GUIDE ROADMAPPING  01/18/2017   IR RADIOLOGIST EVAL & MGMT  01/04/2017   IR RADIOLOGIST EVAL & MGMT  10/26/2017   IR RADIOLOGIST EVAL & MGMT  05/24/2018   IR RADIOLOGIST EVAL & MGMT  06/19/2019   IR RADIOLOGIST EVAL & MGMT  06/11/2020   IR RADIOLOGIST EVAL & MGMT  07/02/2021   IR US GUIDE VASC ACCESS RIGHT  01/18/2017   KNEE ARTHROSCOPY Left    x 2   LEFT HEART CATH AND CORS/GRAFTS ANGIOGRAPHY N/A 04/20/2017   Procedure: Left Heart Cath and Cors/Grafts Angiography;  Surgeon: Lorretta Harp, MD;  Location: [place] [facility] LAB;  LAD now occluded prior to SP1.LIMA-LAD. Patent RCA and circumflex stents. EF 45-50%. Relatively stable.    LEFT HEART CATHETERIZATION WITH CORONARY ANGIOGRAM N/A 05/15/2014   Procedure: LEFT HEART CATHETERIZATION WITH CORONARY ANGIOGRAM;  Surgeon: Sinclair Grooms, MD;  Location: [facility] CATH LAB;  Service: Cardiovascular;  Laterality: N/A;   LEFT HEART CATHETERIZATION WITH CORONARY/GRAFT ANGIOGRAM N/A 06/28/2014   Procedure: LEFT HEART CATHETERIZATION WITH Beatrix Fetters;  Surgeon: Troy Sine, MD;  Location: [facility] CATH LAB;  Service: Cardiovascular;  Laterality: N/A;   LUMBAR PERCUTANEOUS PEDICLE SCREW 1 LEVEL N/A 11/22/2012   Procedure: LUMBAR PERCUTANEOUS PEDICLE SCREW 1 LEVEL;  Surgeon: Eustace Moore, MD;  Location: [place] [facility];  Service: Neurosurgery;  Laterality: N/A;  Lumbar Three-Four Percutaneous Pedicle Screw, Lateral approach   MINOR HEMORRHOIDECTOMY     NM MYOVIEW LTD  05/26/2021   Lexiscan: Intermediate risk (similar findings to last study), with exception of reduced EF roughly 40%..  Prior basal inferior-inferolateral infarct without peri-infarct ischemia.   ->  Echo ordered -> (EF 45 to 50%)-when compared to prior Myoview, infarct appears similar.   PERCUTANEOUS CORONARY STENT  INTERVENTION (PCI-S)  06/2003   PCI - RCA 2 overlapping Taxus DES 2.75 mm x 32 mm and 2.75 mm x 12 mm (3.0 mm); PCI-Cx-OM1 - Taxus DES 3.0 mm x 20 mm (3.1 mm);    PERCUTANEOUS CORONARY STENT INTERVENTION (PCI-S)  12/2005   80% ISR in proximal Taxus stent in RCA -- covered proximally with Cypher DES 3.0 mm x 12 mm   PERCUTANEOUS CORONARY STENT INTERVENTION (PCI-S) N/A 05/17/2014   Procedure: PERCUTANEOUS CORONARY STENT INTERVENTION (PCI-S);  Surgeon: Sinclair Grooms, MD;  Location: [facility] CATH LAB: PCI pRCA stent ISR - Promus DES 3.0 x 8 (3.25),  OM distal stent edge - Promus DES 2.75 x 12 (3.1)   PERIPHERAL VASCULAR CATHETERIZATION  11/03/2016   Procedure: Embolization;  Surgeon: Waynetta Sandy, MD;  Location: [place] CV LAB;  Service: Cardiovascular;;   POSTERIOR CERVICAL FUSION/FORAMINOTOMY N/A 05/02/2013   Procedure: POSTERIOR CERVICAL FUSION/FORAMINOTOMY CERVICAL SEVEN THORACIC-ONE;  Surgeon: Eustace Moore, MD;  Location: [place] [facility];  Service: Neurosurgery;  Laterality: N/A;  POSTERIOR CERVICAL FUSION/FORAMINOTOMY CERVICAL SEVEN THORACIC-ONE   SPHINCTEROTOMY N/A 06/30/2015   Procedure: SPHINCTEROTOMY;  Surgeon: Ladene Artist, MD;  Location: [facility];  Service: Endoscopy;  Laterality: N/A;   TOTAL KNEE ARTHROPLASTY Right    TOTAL KNEE ARTHROPLASTY Left 12/20/2016   Procedure: LEFT TOTAL KNEE ARTHROPLASTY;  Surgeon: Vickey Huger, MD;  Location: [place];  Service: Orthopedics;  Laterality: Left;   TRANSTHORACIC ECHOCARDIOGRAM  06/10/2021   EF 45 to 50%.  Mildly reduced function.  Mild hypokinesis of basal-mid inferior and inferolateral wall consistent with prior infarct.  Severe LA dilation.  Mild to moderate MR with moderate MAC.  Mild calcific AS (mean gradient 11 mmHg) --> LV function seems unchanged.  Diastolic function improved.  Wall motion abnormality unchanged.  AS unchanged.   TRANSTHORACIC ECHOCARDIOGRAM  07/2018   Normal LV size.  EF 50 to 55% with mild HK of  inferolateral wall.  GRII DD.  Mild AS with mean gradient 15 mm or greater.  Mild ascending aortic dilation.  Mildly increased PA pressures of 41 mmHg   UPPER GI ENDOSCOPY  07/03/2019   VISCERAL ANGIOGRAM  11/03/2016   Procedure: Visceral Angiogram;  Surgeon: Waynetta Sandy, MD;  Location: [place] [facility] LAB;  Service: Cardiovascular;;     reports that he quit smoking about 37 years ago. His smoking use included cigarettes. He has a 135.00 pack-year smoking history. He has never used smokeless tobacco. He reports current alcohol use of about 4.0 standard drinks per week. He reports that he does not use drugs. Social History  Allergies  Allergen Reactions   Oxycodone Shortness Of Breath and Cough   Lisinopril Cough   Statins Other (See Comments)    MYALGIAS    Welchol [Colesevelam Hcl] Itching    Family History  Problem Relation Age of Onset   Arthritis Mother    Diabetes Father    Heart disease Father    Stroke Sister    Hypertension Sister    Heart disease Sister    Diabetes Sister    Breast cancer Sister    Heart disease Brother    Ulcers Brother    Colon cancer Neg Hx      Prior to Admission medications   Medication Sig Start Date End Date Taking? Authorizing Provider  acetaminophen (TYLENOL) 500 MG tablet Take 1,000 mg by mouth every 6 (six) hours as needed for mild pain, moderate pain or headache. Reported on 03/16/2016    [provider]  amLODipine (NORVASC) 5 MG tablet Take 1 tablet by mouth once daily 08/03/21   Leonie Man, MD  aspirin EC 81 MG EC tablet Take 1 tablet (81 mg total) by mouth daily. Patient not taking: Reported on 06/26/2021 04/23/17   Daune Perch, NP  Choline Fenofibrate (FENOFIBRIC ACID) 135 MG CPDR TAKE ONE CAPSULE BY MOUTH EVERY DAY 06/09/21   Leonie Man, MD  clopidogrel (PLAVIX) 75 MG tablet Take 1 tablet by mouth once daily 06/09/21   Leonie Man, MD  ezetimibe (ZETIA) 10 MG tablet Take 1 tablet by mouth once  daily 08/17/21   Leonie Man, MD  Ferrous Sulfate (IRON PO) Take 65 mg by mouth.    [provider]  glucose blood (ONETOUCH VERIO) test strip USE ONE STRIP TO CHECK GLUCOSE TWICE DAILY E11.9 Patient not taking: Reported on 06/26/2021 06/15/17   Denita Lung, MD  metFORMIN (GLUCOPHAGE) 500 MG tablet Take 1 tablet by mouth once daily with breakfast Patient not taking: Reported on 06/26/2021 12/07/19   Denita Lung, MD  Multiple Vitamins-Minerals (MULTIVITAMIN WITH MINERALS) tablet Take 1 tablet by mouth daily.    [provider]  pantoprazole (PROTONIX) 40 MG tablet Take 1 tablet by mouth once daily 06/22/21   Leonie Man, MD  ranolazine (RANEXA) 500 MG 12 hr tablet Take 1 tablet by mouth twice daily 12/02/20   Leonie Man, MD  sildenafil (REVATIO) 20 MG tablet TAKE 2-5 TABLETS BY MOUTH AS NEEDED FOR SEXUAL ACTIVITY Patient not taking: Reported on 06/26/2021 11/28/17   Denita Lung, MD  sildenafil (VIAGRA) 100 MG tablet TAKE ONE TABLET BY MOUTH DAILY AS NEEDED FOR ERECTILE DYSFUNCTION 01/07/21   Denita Lung, MD  triamcinolone (KENALOG) 0.1 % apply bid to arms prn itching Patient not taking: Reported on 06/26/2021 07/07/20   [provider]  valsartan-hydrochlorothiazide (DIOVAN HCT) 320-12.5 MG tablet Take 1 tablet by mouth daily. 10/29/20   Denita Lung, MD    Physical Exam: Vitals:   08/19/21 1520 08/19/21 1523 08/19/21 1600 08/19/21 1800  BP:   (!) 152/68 (!) 164/81  Pulse:   69 85  Resp:   (!) 22 16  Temp: 98.7 F (37.1 C)     TempSrc: Oral     SpO2:   99% 99%  Weight:  97.5 kg    Height:  _0  (1.753 m)      Constitutional: NAD, calm, comfortable, pleasant elderly gentleman sitting upright in bed Vitals:   08/19/21 1520 08/19/21 1523 08/19/21 1600 08/19/21 1800  BP:   (!) 152/68 (!) 164/81  Pulse:   69 85  Resp:   (!) 22 16  Temp: 98.7 F (37.1 C)     TempSrc: Oral     SpO2:   99% 99%  Weight:  97.5 kg    Height:  _1  (1.753  m)     Eyes: PERRL, lids and conjunctivae normal ENMT: Mucous membranes are moist.  Neck: normal, supple Respiratory: clear to auscultation bilaterally, no wheezing, no crackles. Normal respiratory effort. No accessory muscle use.  Cardiovascular: Regular rate and rhythm, no murmurs / rubs / gallops. No extremity edema.  Abdomen: no tenderness, no masses palpated. Bowel sounds positive.  Musculoskeletal: no clubbing / cyanosis. No joint deformity upper and lower extremities. Good ROM, no contractures. Normal muscle tone.  Skin: no rashes, lesions, ulcers. No induration Neurologic: CN 2-12 grossly intact. Sensation intact, Strength 5/5 in all 4.  Psychiatric: Normal judgment and insight. Alert and oriented x 3. Normal mood.     Labs on Admission: I have personally reviewed following labs and imaging studies  CBC: Recent Labs  Lab 08/19/21 1530  WBC 6.6  HGB 11.6*  HCT 36.1*  MCV 97.6  PLT 694   Basic Metabolic Panel: Recent Labs  Lab 08/19/21 1530  NA 140  K 4.2  CL 110  CO2 21*  GLUCOSE 97  BUN 23  CREATININE 1.63*  CALCIUM 8.8*   GFR: Estimated Creatinine Clearance: 39.5 mL/min (A) (by C-G formula based on SCr of 1.63 mg/dL (H)).  Liver Function Tests: No results for input(s): AST, ALT, ALKPHOS, BILITOT, PROT, ALBUMIN in the last 168 hours. No results for input(s): LIPASE, AMYLASE in the last 168 hours. No results for input(s): AMMONIA in the last 168 hours. Coagulation Profile: No results for input(s): INR, PROTIME in the last 168 hours. Cardiac Enzymes: No results for input(s): CKTOTAL, CKMB, CKMBINDEX, TROPONINI in the last 168 hours. BNP (last 3 results) No results for input(s): PROBNP in the last 8760 hours. HbA1C: No results for input(s): HGBA1C in the last 72 hours. CBG: No results for input(s): GLUCAP in the last 168 hours. Lipid Profile: No results for input(s): CHOL, HDL, LDLCALC, TRIG, CHOLHDL, LDLDIRECT in the last 72 hours. Thyroid Function  Tests: No results for input(s): TSH, T4TOTAL, FREET4, T3FREE, THYROIDAB in the last 72 hours. Anemia Panel: No results for input(s): VITAMINB12, FOLATE, FERRITIN, TIBC, IRON, RETICCTPCT in the last 72 hours. Urine analysis:    Component Value Date/Time   COLORURINE YELLOW 08/05/2015 [place] 08/05/2015 1305   LABSPEC 1.016 08/05/2015 1305   PHURINE 7.0 08/05/2015 1305   GLUCOSEU NEGATIVE 08/05/2015 1305   HGBUR NEGATIVE 08/05/2015 1305   BILIRUBINUR NEGATIVE 08/05/2015 1305   BILIRUBINUR tt 06/14/2014 1555   KETONESUR NEGATIVE 08/05/2015 1305   PROTEINUR NEGATIVE 08/05/2015 1305   UROBILINOGEN 1.0 08/05/2015 1305   NITRITE NEGATIVE 08/05/2015 1305   LEUKOCYTESUR NEGATIVE 08/05/2015 1305    Radiological Exams on Admission: DG Chest 2 View  Result Date: 08/19/2021 CLINICAL DATA:  Chest pain and short of breath EXAM: CHEST - 2 VIEW COMPARISON:  02/20/2018 FINDINGS: Postop CABG. Heart size and vascularity normal. Lungs clear without infiltrate or effusion Anterior and posterior fusion cervical spine. IMPRESSION: No active cardiopulmonary disease. Electronically Signed   By: Franchot Gallo M.D.   On: 08/19/2021 16:30      Assessment/Plan  NSTEMI Hx of CAD s/p CABG '95 on Plavix  -Significant CAD with patent LIMA to LAD and grafts in the RCA and Lcx.  -Had myocardial perfusion scan in 05/2021 with intermediate risk and decreased LV systolic function to 75% -Initial troponin of 50 with nonspecific T wave inversions on EKG at V4 to V5 -Continue heparin gtt  -Cardiology likely to take for Left heart cath tomorrow  -keep NPO after midnight  -Previously on monotherapy for CAD and PAD.  Add the addition of aspirin per cardiology.  Chronic diastolic heart failure -last echo in 06/2021 with EF of 45-50% with grade 1 diastolic but cardiology feels is an underestimate -Compensated.  Not on beta-blocker due to fatigue -Continue valsartan-HCTZ,  amlodipine  Hyperlipidemia - Has statin intolerance.  Continue Zetia and fenofibrate - Check morning lipid panel  Mild aortic stenosis - Stable.  Followed by cardiology.  AAA s/p EVAR -follows with CT surgery Dr. Donzetta Matters  -last CT abd in September showed 'persistent endoleak resulting in aneurysmal sac enlargement' -unclear of CT surg plan but low suspicion of his current symptoms being related to this   CKD stage 3 -creatinine stable at 1.6 -continue ARB  Type 2 diabetes Diet controlled  DVT prophylaxis:.Heparin infusion Code Status: Full Family Communication: Plan discussed with patient and daughter at bedside  disposition Plan: Home with observation Consults called: Cardiology Admission status: Observation  Level of care: Telemetry Cardiac  Status is: Observation  The patient remains OBS appropriate and will d/c before 2 midnights.        Orene Desanctis DO [facility]   If 7PM-7AM, please contact night-coverage [email or web address]  08/19/2021, 6:49 PM

## 2021-08-19 NOTE — Consult Note (Addendum)
Cardiology Consultation:   Patient ID: [name] MRN: [id number]; DOB: May 07, 1936  Admit date: 08/19/2021 Date of Consult: 08/19/2021  PCP:  Denita Lung, MD   [facility] Providers Cardiologist:  Glenetta Hew, MD        Patient Profile:   [name] is a 85 y.o. male with a hx of CAD since 1985 with STEMI and multiple stents since and then CABG 1997, AAA and repair 08/2015, HLD,  DM-2 CHF Asthma, GERD who is being seen 08/19/2021 for the evaluation of chest pain at the request of Dr. Vanita Panda.  History of Present Illness:   Mr. Hearty with above hx and in 05/2014 PCI to in-stent restenosis of RCA and OM1.  Both areas with DES.  Cath in 2018 with patent stents.   Nuc study 05/2021 with intermediate risk with similar findings to last study, but pump function appears to be slightly reduced.  Basically it is called intermediate risk because of the prior heart attack that was seen.  This was similar to what was seen in 2013 and echocardiogram back in 2017.  Echo was done and EF 45-50% G!DD. Mild to mod MR, mild AS.  EF was similar to prior echo.    Last OV 06/2021 and on angina only fatigue.  On plavix not ASA.  Intolerant of statins. Not interested in PCSK9 or Nexletol.  On zetia and fenofibrate.  On combination of amlodipine and ranolazine for antianginal benefit.  No change for now.  Chronic renal isuff. Cr 1.54.   Previous EVAR for AAA now with increasing size to 5.5 cm. He has CTA evidence of type II endoleak from both lumbar and IMA vessels. Then 10/2016 with Coil embolization of IMA at origin with 70m x 15cm interlock.   Now DOE, several episodes of chest pain and down arms, occurs with exertion and resolves with rest.  He has no NTG.  Dr. JMartiniquehas seen and evaluated.   EKG:  The EKG was personally reviewed and demonstrates:  SR with LVH and Q wave in III. No acute sT changes Telemetry:  Telemetry was personally reviewed and demonstrates:  SR   BNP 119 Hs troponin  50 Na 140, K+ 4.2 Cr 1.63 Hgb 11.5 WBC 6.6 plts 190 2 V CXR NAD. BP 165/81 P 67 R 16 to 24.     Past Medical History:  Diagnosis Date   Adenomatous colon polyp    Ankle edema    Chronic   Asthma    CAD S/P percutaneous coronary angioplasty 2004   a) '04: Staged Taxus DES PCI to RCA (2 Taxus 2.75 x 32 & 12) and Cx-OM (Taxus 3 x 20);;'07 - PCI to pRCA ISR- Cypher DES; c) 05/2014: pPCI pRCA stentISR (Promus DES 3 x 8) OM1 distal to stent (Promus DES 2.75 x 12 - 3.1)   CAD, multiple vessel 1985   Most recent July 2018: Admitted for non-STEMI. Occluded LAD with patent LIMA (SP1 now occluded).  Patent stents in proximal and mid RCA as well as circumflex-OM 3. Known occlusion of SVG-OM. EF 45-50%   CHF (congestive heart failure) ([place]    Cholelithiasis - with cholangitis & choledocholithiasis    status post ERCP with removal of calculi and biliary stent placement.   Chronic anemia    On iron supplement; history of positive guaiac - negative colonoscopy in 1996.; Thought to be related to hemorrhoids; status post hemorrhoidectomy   Chronic back pain     multiple surgeries; C-spine and  lumbar   Diabetes mellitus    on oral medication   Diverticulitis of colon 1996   Diverticulosis    Dyslipidemia, goal LDL below 70    Erectile dysfunction    Exertional dyspnea    Chronic baseline SOB with ambulation   GERD (gastroesophageal reflux disease)    Grade II diastolic dysfunction    H/O: pneumonia 11/2012   Hemorrhoids    Hiatal hernia    History of: ST elevation myocardial infarction (STEMI) involving left circumflex coronary artery with complication 7048   PTCA-circumflex; PCI in 1991   Hypertension    Moderate aortic stenosis by prior echocardiogram 07/2015   Normal LV function - EF 55-60%.. Abnormal relaxation. Mild-moderate aortic stenosis (peak/mean gradient 18/10 mmH)   Osteoarthritis of both knees    And back; multiple back surgeries, right knee arthroplasty and left knee  arthroscopic surgery x2   Pneumonia 2016   S/P AAA (abdominal aortic aneurysm) repair 08/30/2012   s/p EVAR   S/P CABG x 2 1997   LIMA-LAD, SVG-OM   Sleep apnea    pt. states he was told to return for f/u, to be fitted for Cpap, but pt. reports that he didn't follow up-no cpap used   Statin myopathy 09/30/2015    Past Surgical History:  Procedure Laterality Date   Abdominal and Lower Extremity Arterial Ultrasound  08/23/2012; 10/10/2013   Normal ABIs. Nonocclusive lower extremity disease. 4.2 cm x 4.3 cm infrarenal AAA;; 4.4 cm x 4.3 cm (essentially stable)    ABDOMINAL AORTIC ENDOVASCULAR STENT GRAFT N/A 08/13/2015   Procedure: ABDOMINAL AORTIC ENDOVASCULAR STENT GRAFT;  Surgeon: Mal Misty, MD;  Location: [facility] OR;  Service: Vascular;  Laterality: N/A;   Anterior cervical plating  04/23/2010   At C4-5 and a C6-7 utilizing two separate Biomet MaxAn plates.   ANTERIOR LAT LUMBAR FUSION Left 11/22/2012   Procedure: ANTERIOR LATERAL LUMBAR FUSION 1 LEVEL;  Surgeon: Eustace Moore, MD;  Location: [place] [facility] ORS;  Service: Neurosurgery;  Laterality: Left;  Anterior Lateral Lumbar Fusion Lumbar Three-Four   BACK SURGERY  1979 & x 10   pt. remarks, "I have had about 10 back surgeries"   BILIARY STENT PLACEMENT N/A 07/03/2014   Procedure: BILIARY STENT PLACEMENT;  Surgeon: Inda Castle, MD;  Location: [place];  Service: Endoscopy;  Laterality: N/A;   CARDIAC CATHETERIZATION  06/2003   None Occluded vein graft to OM; diffuse RCA disease in the mid vessel, 80% circumflex-OM stenosis; follow on AV groove circumflex with sequential 90% stenoses and intervening saccular dilation    CARDIAC CATHETERIZATION  12/2008   4 abnormal Myoview showing apical thinning (possibly due to apical LAD 95%) : 100% Occluded LAD after SV1, distal LAD grafted via LIMA -apical 95% . Cx -OM1 w/patent stent extending into OM 1 . Follow on Cx - 70-80% - non-amenable PCI. RCA widely patent 3 overlapping stents in mRCA  w/less than 40% stenosisin RPL; SVG-OM known occluded    CARPAL TUNNEL RELEASE Left 11/16/2019   Procedure: LEFT CARPAL TUNNEL RELEASE;  Surgeon: Mcarthur Rossetti, MD;  Location: [facility] ORS;  Service: Orthopedics;  Laterality: Left;   CATARACT EXTRACTION     Cervical arthrodesis  04/23/2010   Anterior cervical arthrodesis, C4-5, C6-7 utilizing 7-mm PEEK interbody cage packed with local autograft & Antifuse putty at C4-5 & an 8-mm cage at C6-7.   CERVICAL DISCECTOMY  04/23/2010   Decompressive anterior carvical diskectomy. C4-5, C6-7   CHOLECYSTECTOMY N/A 05/23/2015   Procedure:  LAPAROSCOPIC CHOLECYSTECTOMY WITH INTRAOPERATIVE CHOLANGIOGRAM;  Surgeon: Alphonsa Overall, MD;  Location: [facility] ORS;  Service: General;  Laterality: N/A;   [place]  1985,1991,15   1985 lateral STEMI Circumflex PTCA;    CORONARY ARTERY BYPASS GRAFT  1997   LIMA-LAD, SVG-OM (SVG known to be occluded prior to 2004)   ERCP N/A 07/03/2014   Procedure: ENDOSCOPIC RETROGRADE CHOLANGIOPANCREATOGRAPHY (ERCP);  Surgeon: Inda Castle, MD;  Location: [place];  Service: Endoscopy;  Laterality: N/A;   ERCP N/A 07/05/2014   Procedure: ENDOSCOPIC RETROGRADE CHOLANGIOPANCREATOGRAPHY (ERCP);  Surgeon: Inda Castle, MD;  Location: [place];  Service: Endoscopy;  Laterality: N/A;   ERCP N/A 06/30/2015   Procedure: ENDOSCOPIC RETROGRADE CHOLANGIOPANCREATOGRAPHY (ERCP);  Surgeon: Ladene Artist, MD;  Location: [facility];  Service: Endoscopy;  Laterality: N/A;   EYE SURGERY     IR ANGIOGRAM EXTREMITY LEFT  01/18/2017   IR ANGIOGRAM PELVIS SELECTIVE OR SUPRASELECTIVE  01/18/2017   IR ANGIOGRAM SELECTIVE EACH ADDITIONAL VESSEL  01/18/2017   IR AORTAGRAM ABDOMINAL SERIALOGRAM  01/18/2017   IR EMBO ARTERIAL NOT HEMORR HEMANG INC GUIDE ROADMAPPING  01/18/2017   IR RADIOLOGIST EVAL & MGMT  01/04/2017   IR RADIOLOGIST EVAL & MGMT  10/26/2017   IR RADIOLOGIST EVAL & MGMT  05/24/2018   IR  RADIOLOGIST EVAL & MGMT  06/19/2019   IR RADIOLOGIST EVAL & MGMT  06/11/2020   IR RADIOLOGIST EVAL & MGMT  07/02/2021   IR US GUIDE VASC ACCESS RIGHT  01/18/2017   KNEE ARTHROSCOPY Left    x 2   LEFT HEART CATH AND CORS/GRAFTS ANGIOGRAPHY N/A 04/20/2017   Procedure: Left Heart Cath and Cors/Grafts Angiography;  Surgeon: Lorretta Harp, MD;  Location: [place] [facility];  LAD now occluded prior to SP1.LIMA-LAD. Patent RCA and circumflex stents. EF 45-50%. Relatively stable.    LEFT HEART CATHETERIZATION WITH CORONARY ANGIOGRAM N/A 05/15/2014   Procedure: LEFT HEART CATHETERIZATION WITH CORONARY ANGIOGRAM;  Surgeon: Sinclair Grooms, MD;  Location: [facility] C[facility] LAB;  Service: Cardiovascular;  Laterality: N/A;   LEFT HEART CATHETERIZATION WITH CORONARY/GRAFT ANGIOGRAM N/A 06/28/2014   Procedure: LEFT HEART CATHETERIZATION WITH Beatrix Fetters;  Surgeon: Troy Sine, MD;  Location: [facility] CATH LAB;  Service: Cardiovascular;  Laterality: N/A;   LUMBAR PERCUTANEOUS PEDICLE SCREW 1 LEVEL N/A 11/22/2012   Procedure: LUMBAR PERCUTANEOUS PEDICLE SCREW 1 LEVEL;  Surgeon: Eustace Moore, MD;  Location: [place] [facility] ORS;  Service: Neurosurgery;  Laterality: N/A;  Lumbar Three-Four Percutaneous Pedicle Screw, Lateral approach   MINOR HEMORRHOIDECTOMY     NM MYOVIEW LTD  05/26/2021   Lexiscan: Intermediate risk (similar findings to last study), with exception of reduced EF roughly 40%..  Prior basal inferior-inferolateral infarct without peri-infarct ischemia.   ->  Echo ordered -> (EF 45 to 50%)-when compared to prior Myoview, infarct appears similar.   PERCUTANEOUS CORONARY STENT INTERVENTION (PCI-S)  06/2003   PCI - RCA 2 overlapping Taxus DES 2.75 mm x 32 mm and 2.75 mm x 12 mm (3.0 mm); PCI-Cx-OM1 - Taxus DES 3.0 mm x 20 mm (3.1 mm);    PERCUTANEOUS CORONARY STENT INTERVENTION (PCI-S)  12/2005   80% ISR in proximal Taxus stent in RCA -- covered proximally with Cypher DES 3.0 mm x 12 mm   PERCUTANEOUS  CORONARY STENT INTERVENTION (PCI-S) N/A 05/17/2014   Procedure: PERCUTANEOUS CORONARY STENT INTERVENTION (PCI-S);  Surgeon: Sinclair Grooms, MD;  Location: [facility] C[facility] LAB: PCI pRCA stent  ISR - Promus DES 3.0 x 8 (3.25), OM distal stent edge - Promus DES 2.75 x 12 (3.1)   PERIPHERAL VASCULAR CATHETERIZATION  11/03/2016   Procedure: Embolization;  Surgeon: Waynetta Sandy, MD;  Location: [place] CV LAB;  Service: Cardiovascular;;   POSTERIOR CERVICAL FUSION/FORAMINOTOMY N/A 05/02/2013   Procedure: POSTERIOR CERVICAL FUSION/FORAMINOTOMY CERVICAL SEVEN THORACIC-ONE;  Surgeon: Eustace Moore, MD;  Location: [place] [facility] OR[facility];  Service: Neurosurgery;  Laterality: N/A;  POSTERIOR CERVICAL FUSION/FORAMINOTOMY CERVICAL SEVEN THORACIC-ONE   SPHINCTEROTOMY N/A 06/30/2015   Procedure: SPHINCTEROTOMY;  Surgeon: Ladene Artist, MD;  Location: [facility];  Service: Endoscopy;  Laterality: N/A;   TOTAL KNEE ARTHROPLASTY Right    TOTAL KNEE ARTHROPLASTY Left 12/20/2016   Procedure: LEFT TOTAL KNEE ARTHROPLASTY;  Surgeon: Vickey Huger, MD;  Location: [place];  Service: Orthopedics;  Laterality: Left;   TRANSTHORACIC ECHOCARDIOGRAM  06/10/2021   EF 45 to 50%.  Mildly reduced function.  Mild hypokinesis of basal-mid inferior and inferolateral wall consistent with prior infarct.  Severe LA dilation.  Mild to moderate MR with moderate MAC.  Mild calcific AS (mean gradient 11 mmHg) --> LV function seems unchanged.  Diastolic function improved.  Wall motion abnormality unchanged.  AS unchanged.   TRANSTHORACIC ECHOCARDIOGRAM  07/2018   Normal LV size.  EF 50 to 55% with mild HK of inferolateral wall.  GRII DD.  Mild AS with mean gradient 15 mm or greater.  Mild ascending aortic dilation.  Mildly increased PA pressures of 41 mmHg   UPPER GI ENDOSCOPY  07/03/2019   VISCERAL ANGIOGRAM  11/03/2016   Procedure: Visceral Angiogram;  Surgeon: Waynetta Sandy, MD;  Location: [place] CV LAB;  Service:  Cardiovascular;;     Home Medications:  Prior to Admission medications   Medication Sig Start Date End Date Taking? Authorizing Provider  acetaminophen (TYLENOL) 500 MG tablet Take 1,000 mg by mouth every 6 (six) hours as needed for mild pain, moderate pain or headache. Reported on 03/16/2016    [provider]  amLODipine (NORVASC) 5 MG tablet Take 1 tablet by mouth once daily 08/03/21   Leonie Man, MD  aspirin EC 81 MG EC tablet Take 1 tablet (81 mg total) by mouth daily. Patient not taking: Reported on 06/26/2021 04/23/17   Daune Perch, NP  Choline Fenofibrate (FENOFIBRIC ACID) 135 MG CPDR TAKE ONE CAPSULE BY MOUTH EVERY DAY 06/09/21   Leonie Man, MD  clopidogrel (PLAVIX) 75 MG tablet Take 1 tablet by mouth once daily 06/09/21   Leonie Man, MD  ezetimibe (ZETIA) 10 MG tablet Take 1 tablet by mouth once daily 08/17/21   Leonie Man, MD  Ferrous Sulfate (IRON PO) Take 65 mg by mouth.    [provider]  glucose blood (ONETOUCH VERIO) test strip USE ONE STRIP TO CHECK GLUCOSE TWICE DAILY E11.9 Patient not taking: Reported on 06/26/2021 06/15/17   Denita Lung, MD  metFORMIN (GLUCOPHAGE) 500 MG tablet Take 1 tablet by mouth once daily with breakfast Patient not taking: Reported on 06/26/2021 12/07/19   Denita Lung, MD  Multiple Vitamins-Minerals (MULTIVITAMIN WITH MINERALS) tablet Take 1 tablet by mouth daily.    [provider]  pantoprazole (PROTONIX) 40 MG tablet Take 1 tablet by mouth once daily 06/22/21   Leonie Man, MD  ranolazine (RANEXA) 500 MG 12 hr tablet Take 1 tablet by mouth twice daily 12/02/20   Leonie Man, MD  sildenafil (REVATIO) 20 MG tablet TAKE  2-5 TABLETS BY MOUTH AS NEEDED FOR SEXUAL ACTIVITY Patient not taking: Reported on 06/26/2021 11/28/17   Denita Lung, MD  sildenafil (VIAGRA) 100 MG tablet TAKE ONE TABLET BY MOUTH DAILY AS NEEDED FOR ERECTILE DYSFUNCTION 01/07/21   Denita Lung, MD  triamcinolone (KENALOG)  0.1 % apply bid to arms prn itching Patient not taking: Reported on 06/26/2021 07/07/20   [provider]  valsartan-hydrochlorothiazide (DIOVAN HCT) 320-12.5 MG tablet Take 1 tablet by mouth daily. 10/29/20   Denita Lung, MD    Inpatient Medications: Scheduled Meds:  Continuous Infusions:  PRN Meds:   Allergies:    Allergies  Allergen Reactions   Oxycodone Shortness Of Breath and Cough   Lisinopril Cough   Statins Other (See Comments)    MYALGIAS    Welchol [Colesevelam Hcl] Itching    Social History:   Social History   Socioeconomic History   Marital status: Married    Spouse name: Not on file   Number of children: Not on file   Years of education: Not on file   Highest education level: Not on file  Occupational History   Not on file  Tobacco Use   Smoking status: Former    Packs/day: 3.00    Years: 45.00    Pack years: 135.00    Types: Cigarettes    Quit date: 10/05/1983    Years since quitting: 37.8   Smokeless tobacco: Never  Vaping Use   Vaping Use: Never used  Substance and Sexual Activity   Alcohol use: Yes    Alcohol/week: 4.0 standard drinks    Types: 4 Glasses of wine per week    Comment: 2 or 3 beers per day and more on the weekend   Drug use: No   Sexual activity: Not Currently  Other Topics Concern   Not on file  Social History Narrative   He is married, father of two, grandfather to 19, great grandfather to two.    Not really getting much exercise now, do to his significant back and hip pain.    He does not smoke and only has an alcoholic beverage.    Social Determinants of Health   Financial Resource Strain: Not on file  Food Insecurity: Not on file  Transportation Needs: Not on file  Physical Activity: Not on file  Stress: Not on file  Social Connections: Not on file  Intimate Partner Violence: Not on file    Family History:    Family History  Problem Relation Age of Onset   Arthritis Mother    Diabetes Father     Heart disease Father    Stroke Sister    Hypertension Sister    Heart disease Sister    Diabetes Sister    Breast cancer Sister    Heart disease Brother    Ulcers Brother    Colon cancer Neg Hx      ROS:  Please see the history of present illness.  General:no colds or fevers, no weight changes Skin:no rashes or ulcers HEENT:no blurred vision, no congestion CV:see HPI PUL:see HPI GI:no diarrhea constipation or melena, no indigestion GU:no hematuria, no dysuria MS:no joint pain, no claudication Neuro:no syncope, no lightheadedness Endo:+ diabetes, no thyroid disease  All other ROS reviewed and negative.     Physical Exam/Data:   Vitals:   08/19/21 1520 08/19/21 1523 08/19/21 1600 08/19/21 1800  BP:   (!) 152/68 (!) 164/81  Pulse:   69 85  Resp:   (!)  22 16  Temp: 98.7 F (37.1 C)     TempSrc: Oral     SpO2:   99% 99%  Weight:  97.5 kg    Height:  _0  (1.753 m)     No intake or output data in the 24 hours ending 08/19/21 1843 Last 3 Weights 08/19/2021 06/26/2021 05/26/2021  Weight (lbs) 215 lb 209 lb 212 lb  Weight (kg) 97.523 kg 94.802 kg 96.163 kg     Body mass index is 31.75 kg/m.  Exam per Dr. Martinique    Relevant CV Studies: Echo 06/10/21  IMPRESSIONS     1. Left ventricular ejection fraction, by estimation, is 45 to 50%. The  left ventricle has mildly decreased function. The left ventricle  demonstrates regional wall motion abnormalities (see scoring  diagram/findings for description). The left ventricular   internal cavity size was mildly dilated. Left ventricular diastolic  parameters are consistent with Grade I diastolic dysfunction (impaired  relaxation). There is moderate hypokinesis of the left ventricular,  basal-mid inferior wall and inferolateral  wall.   2. Right ventricular systolic function is mildly reduced. The right  ventricular size is normal.   3. Left atrial size was severely dilated.   4. The mitral valve is normal in structure.  Mild to moderate mitral valve  regurgitation. No evidence of mitral stenosis. Moderate mitral annular  calcification.   5. The aortic valve is tricuspid. There is moderate calcification of the  aortic valve. There is moderate thickening of the aortic valve. Aortic  valve regurgitation is not visualized. Mild aortic valve stenosis. Aortic  valve mean gradient measures 11.0  mmHg. Aortic valve Vmax measures 2.23 m/s.   Comparison(s): Prior images reviewed side by side. The left ventricular  systolic function is unchanged. The left ventricular diastolic function  has improved. The left ventricular wall motion abnormality is unchanged.  Aortic valve stenosis appears largely   unchanged, although gradients may be underestimated on the current sudy  due to poor Doppler beam alignment.   FINDINGS   Left Ventricle: Left ventricular ejection fraction, by estimation, is 45  to 50%. The left ventricle has mildly decreased function. The left  ventricle demonstrates regional wall motion abnormalities. Moderate  hypokinesis of the left ventricular,  basal-mid inferior wall and inferolateral wall. The left ventricular  internal cavity size was mildly dilated. There is no left ventricular  hypertrophy. Abnormal (paradoxical) septal motion consistent with  post-operative status. Left ventricular diastolic   parameters are consistent with Grade I diastolic dysfunction (impaired  relaxation). Indeterminate filling pressures.      LV Wall Scoring:  The inferior wall and posterior wall are hypokinetic.   Right Ventricle: The right ventricular size is normal. No increase in  right ventricular wall thickness. Right ventricular systolic function is  mildly reduced.   Left Atrium: Left atrial size was severely dilated.   Right Atrium: Right atrial size was normal in size.   Pericardium: There is no evidence of pericardial effusion.   Mitral Valve: The mitral valve is normal in structure. Moderate  mitral  annular calcification. Mild to moderate mitral valve regurgitation, with  centrally-directed jet. No evidence of mitral valve stenosis.   Tricuspid Valve: The tricuspid valve is normal in structure. Tricuspid  valve regurgitation is mild.   Aortic Valve: The aortic valve is tricuspid. There is moderate  calcification of the aortic valve. There is moderate thickening of the  aortic valve. Aortic valve regurgitation is not visualized. Mild aortic  stenosis is  present. Aortic valve mean gradient  measures 11.0 mmHg. Aortic valve peak gradient measures 19.9 mmHg. Aortic  valve area, by VTI measures 1.62 cm.   Pulmonic Valve: The pulmonic valve was normal in structure. Pulmonic valve  regurgitation is trivial.   Nuc 05/26/21   Findings are consistent with prior myocardial infarction. The study is intermediate risk.   No ST deviation was noted.   LV perfusion is abnormal. There is no evidence of inducible ischemia and evidence of infarction.   Defect 1: There is a medium defect with moderate reduction in uptake present in the basal inferior and inferolateral location(s) that is fixed. There is abnormal wall motion in the defect area. Consistent with infarction.   Nuclear stress EF: 40 %. The left ventricular ejection fraction is moderately decreased (30-44%). Left ventricular function is abnormal. Global function is moderately reduced. End diastolic cavity size is mildly enlarged.   Intermediate risk stress nuclear study with old inferior infarction and moderately decreased left ventricular global systolic function. LVEF has decreased from 47% to 40%, compared to 2013. No other significant changes are seen.   Cardiac cath 2018 Prox RCA to Mid RCA lesion, 0 %stenosed. LM lesion, 100 %stenosed. Ost 3rd Mrg to 3rd Mrg lesion, 0 %stenosed. LIMA and is normal in caliber and anatomically normal. Ost LAD to Prox LAD lesion, 100 %stenosed. There is mild left ventricular systolic  dysfunction. LV end diastolic pressure is normal. The left ventricular ejection fraction is 45-50% by visual estimate.  Diagnostic Dominance: Right Intervention  Laboratory Data:  High Sensitivity Troponin:   Recent Labs  Lab 08/19/21 1530  TROPONINIHS 50*     Chemistry Recent Labs  Lab 08/19/21 1530  NA 140  K 4.2  CL 110  CO2 21*  GLUCOSE 97  BUN 23  CREATININE 1.63*  CALCIUM 8.8*  GFRNONAA 42*  ANIONGAP 9    No results for input(s): PROT, ALBUMIN, AST, ALT, ALKPHOS, BILITOT in the last 168 hours. Lipids No results for input(s): CHOL, TRIG, HDL, LABVLDL, LDLCALC, CHOLHDL in the last 168 hours.  Hematology Recent Labs  Lab 08/19/21 1530  WBC 6.6  RBC 3.70*  HGB 11.6*  HCT 36.1*  MCV 97.6  MCH 31.4  MCHC 32.1  RDW 14.7  PLT 190   Thyroid No results for input(s): TSH, FREET4 in the last 168 hours.  BNP Recent Labs  Lab 08/19/21 1530  BNP 119.6*    DDimer No results for input(s): DDIMER in the last 168 hours.   Radiology/Studies:  DG Chest 2 View  Result Date: 08/19/2021 CLINICAL DATA:  Chest pain and short of breath EXAM: CHEST - 2 VIEW COMPARISON:  02/20/2018 FINDINGS: Postop CABG. Heart size and vascularity normal. Lungs clear without infiltrate or effusion Anterior and posterior fusion cervical spine. IMPRESSION: No active cardiopulmonary disease. Electronically Signed   By: Franchot Gallo M.D.   On: 08/19/2021 16:30     Assessment and Plan:   Unstable angina, concerning for NSTEMI - admit per [facility], but check hs troponin begin IV heparin and plan for cardiac cath tomorrow.  NPO after MN.  The patient understands that risks included but are not limited to stroke (1 in 1000), death (1 in 53), kidney failure [usually temporary] (1 in 500), bleeding (1 in 200), allergic reaction [possibly serious] (1 in 200).  CAD with hx of CABG and stents since.  Last PCI 2015.  On plavix but will add ASA 81 for now. CKD 3, will begin mild hydration at MN  check  BMP in am HLD intolerant to statins continue zetia and fenofibrate.  Hx EVAR endovascular aneurysm repair AAA followed by Dr. Donzetta Matters. Recent pelvic CT - do not see plan  HTN continue home meds Mild AS chronic recent echo along with MR.   Risk Assessment/Risk Scores:     TIMI Risk Score for Unstable Angina or Non-ST Elevation MI:   The patient's TIMI risk score is 6, which indicates a 41% risk of all cause mortality, new or recurrent myocardial infarction or need for urgent revascularization in the next 14 days.          For questions or updates, please contact [place] Please consult [email or web address] for contact info under    Signed, Cecilie Kicks, NP  08/19/2021 6:43 PM

## 2021-08-20 ENCOUNTER — Other Ambulatory Visit (HOSPITAL_COMMUNITY): Payer: Self-pay

## 2021-08-20 ENCOUNTER — Inpatient Hospital Stay (HOSPITAL_COMMUNITY)
Admission: EM | Disposition: A | Discharge status: Home / Self Care | Payer: Self-pay | Source: Home / Self Care | Attending: Internal Medicine

## 2021-08-20 DIAGNOSIS — N1831 Chronic kidney disease, stage 3a: Secondary | ICD-10-CM | POA: Diagnosis present

## 2021-08-20 DIAGNOSIS — Z6831 Body mass index (BMI) 31.0-31.9, adult: Secondary | ICD-10-CM | POA: Diagnosis not present

## 2021-08-20 DIAGNOSIS — R079 Chest pain, unspecified: Secondary | ICD-10-CM | POA: Diagnosis present

## 2021-08-20 DIAGNOSIS — Z87891 Personal history of nicotine dependence: Secondary | ICD-10-CM | POA: Diagnosis not present

## 2021-08-20 DIAGNOSIS — Z86010 Personal history of colonic polyps: Secondary | ICD-10-CM | POA: Diagnosis not present

## 2021-08-20 DIAGNOSIS — N1832 Chronic kidney disease, stage 3b: Secondary | ICD-10-CM | POA: Diagnosis not present

## 2021-08-20 DIAGNOSIS — I252 Old myocardial infarction: Secondary | ICD-10-CM | POA: Diagnosis not present

## 2021-08-20 DIAGNOSIS — Z955 Presence of coronary angioplasty implant and graft: Secondary | ICD-10-CM | POA: Diagnosis not present

## 2021-08-20 DIAGNOSIS — I2582 Chronic total occlusion of coronary artery: Secondary | ICD-10-CM | POA: Diagnosis present

## 2021-08-20 DIAGNOSIS — D631 Anemia in chronic kidney disease: Secondary | ICD-10-CM | POA: Diagnosis present

## 2021-08-20 DIAGNOSIS — I25110 Atherosclerotic heart disease of native coronary artery with unstable angina pectoris: Secondary | ICD-10-CM | POA: Diagnosis present

## 2021-08-20 DIAGNOSIS — E1122 Type 2 diabetes mellitus with diabetic chronic kidney disease: Secondary | ICD-10-CM | POA: Diagnosis present

## 2021-08-20 DIAGNOSIS — Z981 Arthrodesis status: Secondary | ICD-10-CM | POA: Diagnosis not present

## 2021-08-20 DIAGNOSIS — I130 Hypertensive heart and chronic kidney disease with heart failure and stage 1 through stage 4 chronic kidney disease, or unspecified chronic kidney disease: Secondary | ICD-10-CM | POA: Diagnosis present

## 2021-08-20 DIAGNOSIS — Z96653 Presence of artificial knee joint, bilateral: Secondary | ICD-10-CM | POA: Diagnosis present

## 2021-08-20 DIAGNOSIS — Z9889 Other specified postprocedural states: Secondary | ICD-10-CM | POA: Diagnosis not present

## 2021-08-20 DIAGNOSIS — T82855A Stenosis of coronary artery stent, initial encounter: Secondary | ICD-10-CM | POA: Diagnosis present

## 2021-08-20 DIAGNOSIS — K219 Gastro-esophageal reflux disease without esophagitis: Secondary | ICD-10-CM | POA: Diagnosis present

## 2021-08-20 DIAGNOSIS — Z8679 Personal history of other diseases of the circulatory system: Secondary | ICD-10-CM | POA: Diagnosis not present

## 2021-08-20 DIAGNOSIS — I2510 Atherosclerotic heart disease of native coronary artery without angina pectoris: Secondary | ICD-10-CM | POA: Diagnosis not present

## 2021-08-20 DIAGNOSIS — I25700 Atherosclerosis of coronary artery bypass graft(s), unspecified, with unstable angina pectoris: Secondary | ICD-10-CM | POA: Diagnosis present

## 2021-08-20 DIAGNOSIS — I214 Non-ST elevation (NSTEMI) myocardial infarction: Secondary | ICD-10-CM | POA: Diagnosis present

## 2021-08-20 DIAGNOSIS — I5032 Chronic diastolic (congestive) heart failure: Secondary | ICD-10-CM | POA: Diagnosis present

## 2021-08-20 DIAGNOSIS — E785 Hyperlipidemia, unspecified: Secondary | ICD-10-CM | POA: Diagnosis present

## 2021-08-20 DIAGNOSIS — Z7902 Long term (current) use of antithrombotics/antiplatelets: Secondary | ICD-10-CM | POA: Diagnosis not present

## 2021-08-20 DIAGNOSIS — Z20822 Contact with and (suspected) exposure to covid-19: Secondary | ICD-10-CM | POA: Diagnosis present

## 2021-08-20 DIAGNOSIS — Y712 Prosthetic and other implants, materials and accessory cardiovascular devices associated with adverse incidents: Secondary | ICD-10-CM | POA: Diagnosis present

## 2021-08-20 DIAGNOSIS — J45909 Unspecified asthma, uncomplicated: Secondary | ICD-10-CM | POA: Diagnosis present

## 2021-08-20 DIAGNOSIS — I080 Rheumatic disorders of both mitral and aortic valves: Secondary | ICD-10-CM | POA: Diagnosis present

## 2021-08-20 HISTORY — PX: LEFT HEART CATH AND CORS/GRAFTS ANGIOGRAPHY: CATH118250

## 2021-08-20 HISTORY — PX: CORONARY STENT INTERVENTION: CATH118234

## 2021-08-20 LAB — POCT ACTIVATED CLOTTING TIME
Activated Clotting Time: 260 seconds
Activated Clotting Time: 289 seconds
Activated Clotting Time: 242 seconds
Activated Clotting Time: 202 seconds

## 2021-08-20 LAB — CBC
HCT: 35.2 % — ABNORMAL LOW (ref 39.0–52.0)
Hemoglobin: 11.3 g/dL — ABNORMAL LOW (ref 13.0–17.0)
MCH: 31.4 pg (ref 26.0–34.0)
MCHC: 32.1 g/dL (ref 30.0–36.0)
MCV: 97.8 fL (ref 80.0–100.0)
Platelets: 188 10*3/uL (ref 150–400)
RBC: 3.6 MIL/uL — ABNORMAL LOW (ref 4.22–5.81)
RDW: 14.9 % (ref 11.5–15.5)
WBC: 7.1 10*3/uL (ref 4.0–10.5)
nRBC: 0 % (ref 0.0–0.2)

## 2021-08-20 LAB — LIPID PANEL
Cholesterol: 152 mg/dL (ref 0–200)
HDL: 43 mg/dL (ref >40)
LDL Cholesterol: 92 mg/dL (ref 0–99)
Total CHOL/HDL Ratio: 3.5 RATIO
Triglycerides: 83 mg/dL (ref <150)
VLDL: 17 mg/dL (ref 0–40)

## 2021-08-20 LAB — BASIC METABOLIC PANEL
Anion gap: 7 (ref 5–15)
BUN: 18 mg/dL (ref 8–23)
CO2: 21 mmol/L — ABNORMAL LOW (ref 22–32)
Calcium: 8.8 mg/dL — ABNORMAL LOW (ref 8.9–10.3)
Chloride: 109 mmol/L (ref 98–111)
Creatinine, Ser: 1.4 mg/dL — ABNORMAL HIGH (ref 0.61–1.24)
GFR, Estimated: 50 mL/min — ABNORMAL LOW (ref >60)
Glucose, Bld: 98 mg/dL (ref 70–99)
Potassium: 3.8 mmol/L (ref 3.5–5.1)
Sodium: 137 mmol/L (ref 135–145)

## 2021-08-20 LAB — SARS CORONAVIRUS 2 BY RT PCR (HOSPITAL ORDER, PERFORMED IN ~~LOC~~ HOSPITAL LAB): SARS Coronavirus 2: NEGATIVE

## 2021-08-20 LAB — PROTIME-INR
INR: 1.1 (ref 0.8–1.2)
Prothrombin Time: 14.5 seconds (ref 11.4–15.2)

## 2021-08-20 LAB — HEMOGLOBIN A1C
Hgb A1c MFr Bld: 5.5 % (ref 4.8–5.6)
Mean Plasma Glucose: 111.15 mg/dL

## 2021-08-20 LAB — HEPARIN LEVEL (UNFRACTIONATED): Heparin Unfractionated: 0.22 IU/mL — ABNORMAL LOW (ref 0.30–0.70)

## 2021-08-20 SURGERY — LEFT HEART CATH AND CORS/GRAFTS ANGIOGRAPHY
Anesthesia: LOCAL

## 2021-08-20 MED ORDER — SODIUM CHLORIDE 0.9% FLUSH: 3.0000 mL | INTRAVENOUS | Status: DC | PRN

## 2021-08-20 MED ORDER — ONDANSETRON HCL 4 MG/2ML IJ SOLN
4.0000 mg | Freq: Four times a day (QID) | INTRAMUSCULAR | Status: DC | PRN
  Filled 2021-08-20: qty 2

## 2021-08-20 MED ORDER — HYDRALAZINE HCL 20 MG/ML IJ SOLN
10.0000 mg | INTRAMUSCULAR | Status: AC | PRN
  Filled 2021-08-20: qty 1

## 2021-08-20 MED ORDER — MORPHINE SULFATE (PF) 2 MG/ML IV SOLN
2.0000 mg | Freq: Once | INTRAVENOUS | Status: AC
  Administered 2021-08-20: 18:00:00 2 mg via INTRAVENOUS
  Filled 2021-08-20: qty 1

## 2021-08-20 MED ORDER — HEPARIN (PORCINE) IN NACL 1000-0.9 UT/500ML-% IV SOLN
INTRAVENOUS | Status: DC | PRN
  Administered 2021-08-20 (×2): 500 mL

## 2021-08-20 MED ORDER — HEPARIN SODIUM (PORCINE) 1000 UNIT/ML IJ SOLN
INTRAMUSCULAR | Status: AC
  Filled 2021-08-20: qty 1

## 2021-08-20 MED ORDER — SODIUM CHLORIDE 0.9 % IV SOLN: 250.0000 mL | INTRAVENOUS | Status: DC | PRN

## 2021-08-20 MED ORDER — TICAGRELOR 90 MG PO TABS
90.0000 mg | ORAL_TABLET | Freq: Two times a day (BID) | ORAL | Status: DC
  Administered 2021-08-21 (×2): 90 mg via ORAL
  Filled 2021-08-20 (×2): qty 1

## 2021-08-20 MED ORDER — SODIUM CHLORIDE 0.9% FLUSH
3.0000 mL | Freq: Two times a day (BID) | INTRAVENOUS | Status: DC
  Administered 2021-08-21: 3 mL via INTRAVENOUS

## 2021-08-20 MED ORDER — TICAGRELOR 90 MG PO TABS
ORAL_TABLET | ORAL | Status: AC
  Filled 2021-08-20: qty 2

## 2021-08-20 MED ORDER — ACETAMINOPHEN 325 MG PO TABS
650.0000 mg | ORAL_TABLET | ORAL | Status: DC | PRN
  Administered 2021-08-20: 15:00:00 650 mg via ORAL
  Filled 2021-08-20: qty 2

## 2021-08-20 MED ORDER — SODIUM CHLORIDE 0.9 % IV SOLN
INTRAVENOUS | Status: AC
  Administered 2021-08-20: 18:00:00 75 mL/h via INTRAVENOUS

## 2021-08-20 MED ORDER — HEPARIN (PORCINE) IN NACL 1000-0.9 UT/500ML-% IV SOLN
INTRAVENOUS | Status: AC
  Filled 2021-08-20: qty 500

## 2021-08-20 MED ORDER — LABETALOL HCL 5 MG/ML IV SOLN: 10.0000 mg | INTRAVENOUS | Status: AC | PRN

## 2021-08-20 MED ORDER — IOHEXOL 350 MG/ML SOLN
INTRAVENOUS | Status: DC | PRN
  Administered 2021-08-20: 14:00:00 95 mL

## 2021-08-20 MED ORDER — HEPARIN SODIUM (PORCINE) 1000 UNIT/ML IJ SOLN
INTRAMUSCULAR | Status: DC | PRN
  Administered 2021-08-20: 3000 [IU] via INTRAVENOUS
  Administered 2021-08-20: 10000 [IU] via INTRAVENOUS

## 2021-08-20 MED ORDER — TICAGRELOR 90 MG PO TABS
ORAL_TABLET | ORAL | Status: DC | PRN
  Administered 2021-08-20: 180 mg via ORAL

## 2021-08-20 MED ORDER — LIDOCAINE HCL (PF) 1 % IJ SOLN
INTRAMUSCULAR | Status: DC | PRN
  Administered 2021-08-20: 15 mL

## 2021-08-20 MED ORDER — NITROGLYCERIN 1 MG/10 ML FOR IR/CATH LAB
INTRA_ARTERIAL | Status: AC
  Filled 2021-08-20: qty 10

## 2021-08-20 MED ORDER — LIDOCAINE HCL (PF) 1 % IJ SOLN
INTRAMUSCULAR | Status: AC
  Filled 2021-08-20: qty 30

## 2021-08-20 MED ORDER — ASPIRIN 81 MG PO CHEW
81.0000 mg | CHEWABLE_TABLET | Freq: Every day | ORAL | Status: DC
  Administered 2021-08-21: 81 mg via ORAL
  Filled 2021-08-20: qty 1

## 2021-08-20 SURGICAL SUPPLY — 17 items
BALLN SAPPHIRE 2.0X12 (BALLOONS) ×2
BALLN ~~LOC~~ EUPHORA RX 2.75X12 (BALLOONS) ×2
BALLOON SAPPHIRE 2.0X12 (BALLOONS) IMPLANT
BALLOON ~~LOC~~ EUPHORA RX 2.75X12 (BALLOONS) IMPLANT
CATH INFINITI 5FR MULTPACK ANG (CATHETERS) ×1 IMPLANT
CATH VISTA GUIDE 6FR XB3 (CATHETERS) ×1 IMPLANT
KIT ENCORE 26 ADVANTAGE (KITS) ×1 IMPLANT
KIT HEART LEFT (KITS) ×2 IMPLANT
PACK CARDIAC CATHETERIZATION (CUSTOM PROCEDURE TRAY) ×2 IMPLANT
SHEATH PINNACLE 5F 10CM (SHEATH) ×1 IMPLANT
SHEATH PINNACLE 6F 10CM (SHEATH) ×1 IMPLANT
STENT ONYX FRONTIER 2.5X15 (Permanent Stent) ×1 IMPLANT
TRANSDUCER W/STOPCOCK (MISCELLANEOUS) ×2 IMPLANT
TUBING CIL FLEX 10 FLL-RA (TUBING) ×1 IMPLANT
WIRE ASAHI PROWATER 180CM (WIRE) ×1 IMPLANT
WIRE EMERALD 3MM-J .035X150CM (WIRE) ×1 IMPLANT
WIRE EMERALD 3MM-J .035X260CM (WIRE) ×1 IMPLANT

## 2021-08-20 NOTE — Progress Notes (Signed)
[place] for IV heparin Indication: chest pain/ACS  Allergies  Allergen Reactions   Oxycodone Shortness Of Breath and Cough   Lisinopril Cough   Statins Other (See Comments)    MYALGIAS    Welchol [Colesevelam Hcl] Itching    Patient Measurements: Height: 5\' 9"  (175.3 cm) Weight: 97.5 kg (215 lb) IBW/kg (Calculated) : 70.7 Heparin Dosing Weight: 91.1 kg   Vital Signs: BP: 148/78 (11/17 0400) Pulse Rate: 65 (11/17 0400)  Labs: Recent Labs    08/19/21 1530 08/19/21 1853 08/20/21 0427  HGB 11.6*  --  11.3*  HCT 36.1*  --  35.2*  PLT 190  --  188  LABPROT  --   --  14.5  INR  --   --  1.1  HEPARINUNFRC  --   --  0.22*  CREATININE 1.63*  --   --   TROPONINIHS 50* 67*  --      Estimated Creatinine Clearance: 39.5 mL/min (A) (by C-G formula based on SCr of 1.63 mg/dL (H)).   Assessment: [name] presented on 11/16 with chest pain and previous cardiac history. No PTA anticoagulation. Pharmacy consulted to start IV heparin.   Heparin level subtherapeutic (0.22) on gtt at 1200 units/hr. No issues with line or bleeding reported per RN. Plan for cardiac cath today.  Goal of Therapy:  Heparin level 0.3-0.7 units/ml Monitor platelets by anticoagulation protocol: Yes   Plan:  Increase heparin infusion to 1400 units/hr Will f/u post cath  Sherlon Handing, PharmD, BCPS Please see amion for complete clinical pharmacist phone list 08/20/2021,5:45 AM

## 2021-08-20 NOTE — Progress Notes (Signed)
Progress Note  Patient Name: [name] Date of Encounter: 08/20/2021  [facility] Cardiologist: Glenetta Hew, MD   Subjective   Denies any recurrent chest pain overnight.  Inpatient Medications    Scheduled Meds:  amLODipine  5 mg Oral Daily   aspirin  81 mg Oral Daily   clopidogrel  75 mg Oral Daily   ezetimibe  10 mg Oral Daily   fenofibrate  160 mg Oral Daily   ferrous sulfate  325 mg Oral Q breakfast   irbesartan  300 mg Oral Daily   And   hydrochlorothiazide  12.5 mg Oral Daily   pantoprazole  40 mg Oral Daily   ranolazine  500 mg Oral BID   sodium chloride flush  3 mL Intravenous Q12H   Continuous Infusions:  sodium chloride     sodium chloride 50 mL/hr at 08/20/21 0016   heparin 1,400 Units/hr (08/20/21 0601)   PRN Meds: sodium chloride, acetaminophen, ondansetron (ZOFRAN) IV, sodium chloride flush   Vital Signs    Vitals:   08/20/21 0015 08/20/21 0300 08/20/21 0400 08/20/21 0500  BP: (!) 174/88 (!) 152/77 (!) 148/78 (!) 147/85  Pulse: 84 65 65 63  Resp: (!) 23 (!) 21 20 (!) 21  Temp:      TempSrc:      SpO2: 100% 98% 96% 97%  Weight:      Height:        Intake/Output Summary (Last 24 hours) at 08/20/2021 0748 Last data filed at 08/19/2021 2304 Gross per 24 hour  Intake --  Output 200 ml  Net -200 ml   Last 3 Weights 08/19/2021 06/26/2021 05/26/2021  Weight (lbs) 215 lb 209 lb 212 lb  Weight (kg) 97.523 kg 94.802 kg 96.163 kg      Telemetry    NSR - Personally Reviewed  ECG    Today's Ecg is pending - Personally Reviewed  Physical Exam   GEN: No acute distress.   Neck: No JVD Cardiac: RRR, 1/6 SEM, no rubs, or gallops.  Respiratory: Clear to auscultation bilaterally. GI: Soft, nontender, non-distended  MS: No edema; No deformity. Neuro:  Nonfocal  Psych: Normal affect   Labs    High Sensitivity Troponin:   Recent Labs  Lab 08/19/21 1530 08/19/21 1853  TROPONINIHS 50* 67*     Chemistry Recent Labs  Lab  08/19/21 1530 08/20/21 0427  NA 140 137  K 4.2 3.8  CL 110 109  CO2 21* 21*  GLUCOSE 97 98  BUN 23 18  CREATININE 1.63* 1.40*  CALCIUM 8.8* 8.8*  GFRNONAA 42* 50*  ANIONGAP 9 7    Lipids  Recent Labs  Lab 08/20/21 0427  CHOL 152  TRIG 83  HDL 43  LDLCALC 92  CHOLHDL 3.5    Hematology Recent Labs  Lab 08/19/21 1530 08/20/21 0427  WBC 6.6 7.1  RBC 3.70* 3.60*  HGB 11.6* 11.3*  HCT 36.1* 35.2*  MCV 97.6 97.8  MCH 31.4 31.4  MCHC 32.1 32.1  RDW 14.7 14.9  PLT 190 188   Thyroid No results for input(s): TSH, FREET4 in the last 168 hours.  BNP Recent Labs  Lab 08/19/21 1530  BNP 119.6*    DDimer No results for input(s): DDIMER in the last 168 hours.   Radiology    DG Chest 2 View  Result Date: 08/19/2021 CLINICAL DATA:  Chest pain and short of breath EXAM: CHEST - 2 VIEW COMPARISON:  02/20/2018 FINDINGS: Postop CABG. Heart size and vascularity  normal. Lungs clear without infiltrate or effusion Anterior and posterior fusion cervical spine. IMPRESSION: No active cardiopulmonary disease. Electronically Signed   By: Franchot Gallo M.D.   On: 08/19/2021 16:30    Cardiac Studies   Echo 06/10/21  IMPRESSIONS     1. Left ventricular ejection fraction, by estimation, is 45 to 50%. The  left ventricle has mildly decreased function. The left ventricle  demonstrates regional wall motion abnormalities (see scoring  diagram/findings for description). The left ventricular   internal cavity size was mildly dilated. Left ventricular diastolic  parameters are consistent with Grade I diastolic dysfunction (impaired  relaxation). There is moderate hypokinesis of the left ventricular,  basal-mid inferior wall and inferolateral  wall.   2. Right ventricular systolic function is mildly reduced. The right  ventricular size is normal.   3. Left atrial size was severely dilated.   4. The mitral valve is normal in structure. Mild to moderate mitral valve  regurgitation. No  evidence of mitral stenosis. Moderate mitral annular  calcification.   5. The aortic valve is tricuspid. There is moderate calcification of the  aortic valve. There is moderate thickening of the aortic valve. Aortic  valve regurgitation is not visualized. Mild aortic valve stenosis. Aortic  valve mean gradient measures 11.0  mmHg. Aortic valve Vmax measures 2.23 m/s.   Comparison(s): Prior images reviewed side by side. The left ventricular  systolic function is unchanged. The left ventricular diastolic function  has improved. The left ventricular wall motion abnormality is unchanged.  Aortic valve stenosis appears largely   unchanged, although gradients may be underestimated on the current sudy  due to poor Doppler beam alignment.   FINDINGS   Left Ventricle: Left ventricular ejection fraction, by estimation, is 45  to 50%. The left ventricle has mildly decreased function. The left  ventricle demonstrates regional wall motion abnormalities. Moderate  hypokinesis of the left ventricular,  basal-mid inferior wall and inferolateral wall. The left ventricular  internal cavity size was mildly dilated. There is no left ventricular  hypertrophy. Abnormal (paradoxical) septal motion consistent with  post-operative status. Left ventricular diastolic   parameters are consistent with Grade I diastolic dysfunction (impaired  relaxation). Indeterminate filling pressures.      LV Wall Scoring:  The inferior wall and posterior wall are hypokinetic.   Right Ventricle: The right ventricular size is normal. No increase in  right ventricular wall thickness. Right ventricular systolic function is  mildly reduced.   Left Atrium: Left atrial size was severely dilated.   Right Atrium: Right atrial size was normal in size.   Pericardium: There is no evidence of pericardial effusion.   Mitral Valve: The mitral valve is normal in structure. Moderate mitral  annular calcification. Mild to moderate  mitral valve regurgitation, with  centrally-directed jet. No evidence of mitral valve stenosis.   Tricuspid Valve: The tricuspid valve is normal in structure. Tricuspid  valve regurgitation is mild.   Aortic Valve: The aortic valve is tricuspid. There is moderate  calcification of the aortic valve. There is moderate thickening of the  aortic valve. Aortic valve regurgitation is not visualized. Mild aortic  stenosis is present. Aortic valve mean gradient  measures 11.0 mmHg. Aortic valve peak gradient measures 19.9 mmHg. Aortic  valve area, by VTI measures 1.62 cm.   Pulmonic Valve: The pulmonic valve was normal in structure. Pulmonic valve  regurgitation is trivial.    Nuc 05/26/21   Findings are consistent with prior myocardial infarction. The study is  intermediate risk.   No ST deviation was noted.   LV perfusion is abnormal. There is no evidence of inducible ischemia and evidence of infarction.   Defect 1: There is a medium defect with moderate reduction in uptake present in the basal inferior and inferolateral location(s) that is fixed. There is abnormal wall motion in the defect area. Consistent with infarction.   Nuclear stress EF: 40 %. The left ventricular ejection fraction is moderately decreased (30-44%). Left ventricular function is abnormal. Global function is moderately reduced. End diastolic cavity size is mildly enlarged.   Intermediate risk stress nuclear study with old inferior infarction and moderately decreased left ventricular global systolic function. LVEF has decreased from 47% to 40%, compared to 2013. No other significant changes are seen.   Cardiac cath 2018 Prox RCA to Mid RCA lesion, 0 %stenosed. LM lesion, 100 %stenosed. Ost 3rd Mrg to 3rd Mrg lesion, 0 %stenosed. LIMA and is normal in caliber and anatomically normal. Ost LAD to Prox LAD lesion, 100 %stenosed. There is mild left ventricular systolic dysfunction. LV end diastolic pressure is normal. The  left ventricular ejection fraction is 45-50% by visual estimate.  Diagnostic Dominance: Right Intervention    Patient Profile     85 y.o. male with a hx of CAD since 1985 with STEMI and multiple stents since and then CABG 1997, AAA and repair 08/2015, HLD,  DM-2 CHF Asthma, GERD who is being seen 08/19/2021 for the evaluation of chest pain at the request of Dr. Vanita Panda.  Assessment & Plan    NSTEMI. History is fairly classic. Troponin mildly elevated 50-64. Pain free on IV heparin. Plan cardiac cath today. The patient understands that risks included but are not limited to stroke (1 in 1000), death (1 in 75), kidney failure [usually temporary] (1 in 500), bleeding (1 in 200), allergic reaction [possibly serious] (1 in 200).  CAD with hx of CABG and stents since.  Last PCI 2015.  On plavix but will add ASA 81 for now. CKD 3,. Hydrated overnight and creatinine improved to 1.4.  HLD intolerant to statins continue zetia and fenofibrate.  Hx EVAR endovascular aneurysm repair AAA followed by Dr. Donzetta Matters. 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 Needs follow up with vascular surgery HTN continue home meds Mild AS chronic recent echo along with MR.     For questions or updates, please contact [place] Please consult [email or web address] for contact info under        Signed, Khan Chura [place], MD  08/20/2021, 7:48 AM

## 2021-08-20 NOTE — Progress Notes (Addendum)
Site area: right groin 6 french arterial sheath was removed  Site Prior to Removal:  Level 2 hematoma and marked  Pressure Applied For 30 MINUTES    Bedrest Beginning at 1820pm X hours  Manual:   Yes.    Patient Status During Pull:  stable  Post Pull Groin Site:  Level 1 Bruise Area soft to touch, - Post Pull Instructions Given:  Yes.    Post Pull Pulses Present:  Yes.    Dressing Applied:  Yes  Comments:   Morphine given for groin discomfort.  Pt tolerated well

## 2021-08-20 NOTE — Care Management Obs Status (Signed)
[place] NOTIFICATION   Patient Details  Name: FONTAINE HEHL MRN: [id number] Date of Birth: April 17, 1936   Medicare Observation Status Notification Given:  Yes  Verbal permission given to sign due to phone interaction of notice  Verdell Carmine, RN 08/20/2021, 11:36 AM

## 2021-08-20 NOTE — TOC Benefit Eligibility Note (Signed)
Patient Teacher, English as a foreign language completed.    The patient is currently admitted and upon discharge could be taking Brilinta 90 mg.  The current 30 day co-pay is, $40.00.   The patient is insured through [place] Patient Advocate Specialist [place] Patient Advocate Team Direct Number: [phone]  Fax: [phone]

## 2021-08-20 NOTE — Plan of Care (Signed)
  Problem: Coping: Goal: Level of anxiety will decrease Outcome: Progressing   Problem: Elimination: Goal: Will not experience complications related to bowel motility Outcome: Progressing   Problem: Pain Managment: Goal: General experience of comfort will improve Outcome: Progressing   

## 2021-08-20 NOTE — H&P (View-Only) (Signed)
Progress Note  Patient Name: [name] Date of Encounter: 08/20/2021  [facility] Cardiologist: Glenetta Hew, MD   Subjective   Denies any recurrent chest pain overnight.  Inpatient Medications    Scheduled Meds:  amLODipine  5 mg Oral Daily   aspirin  81 mg Oral Daily   clopidogrel  75 mg Oral Daily   ezetimibe  10 mg Oral Daily   fenofibrate  160 mg Oral Daily   ferrous sulfate  325 mg Oral Q breakfast   irbesartan  300 mg Oral Daily   And   hydrochlorothiazide  12.5 mg Oral Daily   pantoprazole  40 mg Oral Daily   ranolazine  500 mg Oral BID   sodium chloride flush  3 mL Intravenous Q12H   Continuous Infusions:  sodium chloride     sodium chloride 50 mL/hr at 08/20/21 0016   heparin 1,400 Units/hr (08/20/21 0601)   PRN Meds: sodium chloride, acetaminophen, ondansetron (ZOFRAN) IV, sodium chloride flush   Vital Signs    Vitals:   08/20/21 0015 08/20/21 0300 08/20/21 0400 08/20/21 0500  BP: (!) 174/88 (!) 152/77 (!) 148/78 (!) 147/85  Pulse: 84 65 65 63  Resp: (!) 23 (!) 21 20 (!) 21  Temp:      TempSrc:      SpO2: 100% 98% 96% 97%  Weight:      Height:        Intake/Output Summary (Last 24 hours) at 08/20/2021 0748 Last data filed at 08/19/2021 2304 Gross per 24 hour  Intake --  Output 200 ml  Net -200 ml   Last 3 Weights 08/19/2021 06/26/2021 05/26/2021  Weight (lbs) 215 lb 209 lb 212 lb  Weight (kg) 97.523 kg 94.802 kg 96.163 kg      Telemetry    NSR - Personally Reviewed  ECG    Today's Ecg is pending - Personally Reviewed  Physical Exam   GEN: No acute distress.   Neck: No JVD Cardiac: RRR, 1/6 SEM, no rubs, or gallops.  Respiratory: Clear to auscultation bilaterally. GI: Soft, nontender, non-distended  MS: No edema; No deformity. Neuro:  Nonfocal  Psych: Normal affect   Labs    High Sensitivity Troponin:   Recent Labs  Lab 08/19/21 1530 08/19/21 1853  TROPONINIHS 50* 67*     Chemistry Recent Labs  Lab  08/19/21 1530 08/20/21 0427  NA 140 137  K 4.2 3.8  CL 110 109  CO2 21* 21*  GLUCOSE 97 98  BUN 23 18  CREATININE 1.63* 1.40*  CALCIUM 8.8* 8.8*  GFRNONAA 42* 50*  ANIONGAP 9 7    Lipids  Recent Labs  Lab 08/20/21 0427  CHOL 152  TRIG 83  HDL 43  LDLCALC 92  CHOLHDL 3.5    Hematology Recent Labs  Lab 08/19/21 1530 08/20/21 0427  WBC 6.6 7.1  RBC 3.70* 3.60*  HGB 11.6* 11.3*  HCT 36.1* 35.2*  MCV 97.6 97.8  MCH 31.4 31.4  MCHC 32.1 32.1  RDW 14.7 14.9  PLT 190 188   Thyroid No results for input(s): TSH, FREET4 in the last 168 hours.  BNP Recent Labs  Lab 08/19/21 1530  BNP 119.6*    DDimer No results for input(s): DDIMER in the last 168 hours.   Radiology    DG Chest 2 View  Result Date: 08/19/2021 CLINICAL DATA:  Chest pain and short of breath EXAM: CHEST - 2 VIEW COMPARISON:  02/20/2018 FINDINGS: Postop CABG. Heart size and vascularity  normal. Lungs clear without infiltrate or effusion Anterior and posterior fusion cervical spine. IMPRESSION: No active cardiopulmonary disease. Electronically Signed   By: Franchot Gallo M.D.   On: 08/19/2021 16:30    Cardiac Studies   Echo 06/10/21  IMPRESSIONS     1. Left ventricular ejection fraction, by estimation, is 45 to 50%. The  left ventricle has mildly decreased function. The left ventricle  demonstrates regional wall motion abnormalities (see scoring  diagram/findings for description). The left ventricular   internal cavity size was mildly dilated. Left ventricular diastolic  parameters are consistent with Grade I diastolic dysfunction (impaired  relaxation). There is moderate hypokinesis of the left ventricular,  basal-mid inferior wall and inferolateral  wall.   2. Right ventricular systolic function is mildly reduced. The right  ventricular size is normal.   3. Left atrial size was severely dilated.   4. The mitral valve is normal in structure. Mild to moderate mitral valve  regurgitation. No  evidence of mitral stenosis. Moderate mitral annular  calcification.   5. The aortic valve is tricuspid. There is moderate calcification of the  aortic valve. There is moderate thickening of the aortic valve. Aortic  valve regurgitation is not visualized. Mild aortic valve stenosis. Aortic  valve mean gradient measures 11.0  mmHg. Aortic valve Vmax measures 2.23 m/s.   Comparison(s): Prior images reviewed side by side. The left ventricular  systolic function is unchanged. The left ventricular diastolic function  has improved. The left ventricular wall motion abnormality is unchanged.  Aortic valve stenosis appears largely   unchanged, although gradients may be underestimated on the current sudy  due to poor Doppler beam alignment.   FINDINGS   Left Ventricle: Left ventricular ejection fraction, by estimation, is 45  to 50%. The left ventricle has mildly decreased function. The left  ventricle demonstrates regional wall motion abnormalities. Moderate  hypokinesis of the left ventricular,  basal-mid inferior wall and inferolateral wall. The left ventricular  internal cavity size was mildly dilated. There is no left ventricular  hypertrophy. Abnormal (paradoxical) septal motion consistent with  post-operative status. Left ventricular diastolic   parameters are consistent with Grade I diastolic dysfunction (impaired  relaxation). Indeterminate filling pressures.      LV Wall Scoring:  The inferior wall and posterior wall are hypokinetic.   Right Ventricle: The right ventricular size is normal. No increase in  right ventricular wall thickness. Right ventricular systolic function is  mildly reduced.   Left Atrium: Left atrial size was severely dilated.   Right Atrium: Right atrial size was normal in size.   Pericardium: There is no evidence of pericardial effusion.   Mitral Valve: The mitral valve is normal in structure. Moderate mitral  annular calcification. Mild to moderate  mitral valve regurgitation, with  centrally-directed jet. No evidence of mitral valve stenosis.   Tricuspid Valve: The tricuspid valve is normal in structure. Tricuspid  valve regurgitation is mild.   Aortic Valve: The aortic valve is tricuspid. There is moderate  calcification of the aortic valve. There is moderate thickening of the  aortic valve. Aortic valve regurgitation is not visualized. Mild aortic  stenosis is present. Aortic valve mean gradient  measures 11.0 mmHg. Aortic valve peak gradient measures 19.9 mmHg. Aortic  valve area, by VTI measures 1.62 cm.   Pulmonic Valve: The pulmonic valve was normal in structure. Pulmonic valve  regurgitation is trivial.    Nuc 05/26/21   Findings are consistent with prior myocardial infarction. The study is  intermediate risk.   No ST deviation was noted.   LV perfusion is abnormal. There is no evidence of inducible ischemia and evidence of infarction.   Defect 1: There is a medium defect with moderate reduction in uptake present in the basal inferior and inferolateral location(s) that is fixed. There is abnormal wall motion in the defect area. Consistent with infarction.   Nuclear stress EF: 40 %. The left ventricular ejection fraction is moderately decreased (30-44%). Left ventricular function is abnormal. Global function is moderately reduced. End diastolic cavity size is mildly enlarged.   Intermediate risk stress nuclear study with old inferior infarction and moderately decreased left ventricular global systolic function. LVEF has decreased from 47% to 40%, compared to 2013. No other significant changes are seen.   Cardiac cath 2018 Prox RCA to Mid RCA lesion, 0 %stenosed. LM lesion, 100 %stenosed. Ost 3rd Mrg to 3rd Mrg lesion, 0 %stenosed. LIMA and is normal in caliber and anatomically normal. Ost LAD to Prox LAD lesion, 100 %stenosed. There is mild left ventricular systolic dysfunction. LV end diastolic pressure is normal. The  left ventricular ejection fraction is 45-50% by visual estimate.  Diagnostic Dominance: Right Intervention    Patient Profile     85 y.o. male with a hx of CAD since 1985 with STEMI and multiple stents since and then CABG 1997, AAA and repair 08/2015, HLD,  DM-2 CHF Asthma, GERD who is being seen 08/19/2021 for the evaluation of chest pain at the request of Dr. Vanita Panda.  Assessment & Plan    NSTEMI. History is fairly classic. Troponin mildly elevated 50-64. Pain free on IV heparin. Plan cardiac cath today. The patient understands that risks included but are not limited to stroke (1 in 1000), death (1 in 86), kidney failure [usually temporary] (1 in 500), bleeding (1 in 200), allergic reaction [possibly serious] (1 in 200).  CAD with hx of CABG and stents since.  Last PCI 2015.  On plavix but will add ASA 81 for now. CKD 3,. Hydrated overnight and creatinine improved to 1.4.  HLD intolerant to statins continue zetia and fenofibrate.  Hx EVAR endovascular aneurysm repair AAA followed by Dr. Donzetta Matters. 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 Needs follow up with vascular surgery HTN continue home meds Mild AS chronic recent echo along with MR.     For questions or updates, please contact [place] Please consult [email or web address] for contact info under        Signed, Rowan Blaker [place], MD  08/20/2021, 7:48 AM

## 2021-08-20 NOTE — Interval H&P Note (Signed)
Cath Lab Visit (complete for each Cath Lab visit)  Clinical Evaluation Leading to the Procedure:   ACS: Yes.    Non-ACS:    Anginal Classification: CCS II  Anti-ischemic medical therapy: Minimal Therapy (1 class of medications)  Non-Invasive Test Results: No non-invasive testing performed  Prior CABG: Previous CABG      History and Physical Interval Note:  08/20/2021 12:55 PM  [name]  has presented today for surgery, with the diagnosis of unstable angina.  The various methods of treatment have been discussed with the patient and family. After consideration of risks, benefits and other options for treatment, the patient has consented to  Procedure(s): LEFT HEART CATH AND CORS/GRAFTS ANGIOGRAPHY (N/A) as a surgical intervention.  The patient's history has been reviewed, patient examined, no change in status, stable for surgery.  I have reviewed the patient's chart and labs.  Questions were answered to the patient's satisfaction.     Quay Burow

## 2021-08-20 NOTE — Progress Notes (Addendum)
PROGRESS NOTE   [name]  ZOX:[id number]    DOB: [birth year]    DOA: 08/19/2021  PCP: Denita Lung, MD   I have briefly reviewed patients previous medical records in [facility].  Chief Complaint  Patient presents with   Chest Pain    Brief Narrative:   85 y.o. male with medical history significant for CAD s/p CABG 1997, multiple prior stents, AAA s/p EVAR, PAD, chronic diastolic heart failure, type 2 diabetes, hypertension, hyperlipidemia, CKD stage III who presents with concerns of chest pain.  Chest pain Onset, started with minimal exertion 1 day prior to admission, at central chest radiating across his chest and down his left arm, would go away after 10 to 15 minutes of rest, similar to prior episodes of angina.  Admitted for NSTEMI, initiated on IV heparin drip, prehydration for CKD and for cardiac cath and possible PCI 11/17.   Assessment & Plan:  Active Problems:   Type 2 diabetes mellitus in remission ([place])   Hyperlipidemia LDL goal <70; statin intolerant   CAD S/P CABG '95- several PCis since, last 05/17/14   Chronic renal insufficiency, stage III (moderate) (HCC)   Mild aortic stenosis by prior echocardiogram   Status post endovascular aneurysm repair (EVAR)   Non-ST elevation (NSTEMI) myocardial infarction [facility])   Chest pain   NSTEMI in patient with history of CAD, CABG, multiple prior stents - CABG 1997.  Last PCI 2015.  Only on Plavix PTA. - Myocardial perfusion scan in 05/2021 with intermediate risk and decreased LV systolic function to 81%. - Typical angina history. - Troponin 50 > 64 - Cardiology was consulted on admission.  Started on IV heparin.  Aspirin added to Plavix. - S/p cardiac cath 11/17 (detailed report as below).  Successful PCI of drug-eluting stent of distal circumflex obtuse marginal branch stenosis.  Uninterrupted DAPT x12 months.  Chronic diastolic CHF - Last echo 10/9145 with EF of 45-50% with grade 1 diastolic dysfunction but  Cardiology felt that it was an underestimate. - Compensated.  Essential hypertension: - Mildly uncontrolled at times. - Continue amlodipine 5 Mg daily, HCTZ 12.5 Mg daily, irbesartan 300 Mg daily.  Monitor closely and may have to titrate meds.  Hyperlipidemia - Intolerant to statins. - Continue Zetia and fenofibrate.  CKD stage III a - Baseline creatinine widely fluctuating between 1.4-1.7 over the last couple of years. - Presented with creatinine of 1.63, down to 1.4 after prehydration for cath. - Follow BMP in AM.  Anemia in CKD - Stable.  Diet controlled type II DM  History of EVAR endovascular aneurysm repair AAA: - Followed by Dr. Donzetta Matters, vascular surgery. - Recent CT abdomen/pelvis showed possible persistent endoleak and needs to follow-up with vascular surgery as outpatient.  Body mass index is 31.75 kg/m.    DVT prophylaxis:   On IV heparin drip for NSTEMI   Code Status: Full Code Family Communication: None at bedside. Disposition:  Status is: Inpatient  The patient will require care spanning > 2 midnights and should be moved to inpatient because: Admitted for NSTEMI and underwent cardiac cath with PCI.       Consultants:   Cardiology  Procedures:   Left heart cath, coronary angiography and coronary stent intervention ninths 11/17:    Ost LAD to Prox LAD lesion is 100% stenosed.   Dist Cx lesion is 99% stenosed.   Previously placed Prox RCA to Mid RCA stent (unknown type) is  widely patent.   Previously placed Mid  Cx to Dist Cx stent (unknown type) is  widely patent.   A drug-eluting stent was successfully placed.   Post intervention, there is a 0% residual stenosis.   Post intervention, there is a 0% residual stenosis.  Antimicrobials:      Subjective:  Seen this morning prior to procedures.  Denied any chest pain since hospital arrival.  But also indicated that he had not been out of bed.  No other complaints reported  Objective:   Vitals:    08/20/21 1344 08/20/21 1349 08/20/21 1354 08/20/21 1359  BP: (!) 161/82 (!) 169/79 (!) 163/87 (!) 171/91  Pulse: 62 63 61 68  Resp: 17 (!) 50 (!) 30 (!) 27  Temp:      TempSrc:      SpO2: 100% 100% 100% 100%  Weight:      Height:        General exam: Elderly male, lying comfortably propped up in bed without distress. Respiratory system: Clear to auscultation. Respiratory effort normal.  Midline sternotomy scar. Cardiovascular system: S1 & S2 heard, RRR. No JVD, murmurs, rubs, gallops or clicks. No pedal edema.  Telemetry personally reviewed: SB in the 50s-SR in the 60s. Gastrointestinal system: Abdomen is nondistended, soft and nontender. No organomegaly or masses felt. Normal bowel sounds heard. Central nervous system: Alert and oriented. No focal neurological deficits. Extremities: Symmetric 5 x 5 power. Skin: No rashes, lesions or ulcers Psychiatry: Judgement and insight appear normal. Mood & affect appropriate.     Data Reviewed:   I have personally reviewed following labs and imaging studies   CBC: Recent Labs  Lab 08/19/21 1530 08/20/21 0427  WBC 6.6 7.1  HGB 11.6* 11.3*  HCT 36.1* 35.2*  MCV 97.6 97.8  PLT 190 222    Basic Metabolic Panel: Recent Labs  Lab 08/19/21 1530 08/20/21 0427  NA 140 137  K 4.2 3.8  CL 110 109  CO2 21* 21*  GLUCOSE 97 98  BUN 23 18  CREATININE 1.63* 1.40*  CALCIUM 8.8* 8.8*    Liver Function Tests: No results for input(s): AST, ALT, ALKPHOS, BILITOT, PROT, ALBUMIN in the last 168 hours.  CBG: No results for input(s): GLUCAP in the last 168 hours.  Microbiology Studies:   Recent Results (from the past 240 hour(s))  SARS Coronavirus 2 by RT PCR (hospital order, performed in [facility] hospital lab) Nasopharyngeal Nasopharyngeal Swab     Status: None   Collection Time: 08/20/21  4:25 AM   Specimen: Nasopharyngeal Swab  Result Value Ref Range Status   SARS Coronavirus 2 NEGATIVE NEGATIVE Final    Comment:  (NOTE) SARS-CoV-2 target nucleic acids are NOT DETECTED.  The SARS-CoV-2 RNA is generally detectable in upper and lower respiratory specimens during the acute phase of infection. The lowest concentration of SARS-CoV-2 viral copies this assay can detect is 250 copies / mL. A negative result does not preclude SARS-CoV-2 infection and should not be used as the sole basis for treatment or other patient management decisions.  A negative result may occur with improper specimen collection / handling, submission of specimen other than nasopharyngeal swab, presence of viral mutation(s) within the areas targeted by this assay, and inadequate number of viral copies (<250 copies / mL). A negative result must be combined with clinical observations, patient history, and epidemiological information.  Fact Sheet for Patients:   StrictlyIdeas.no  Fact Sheet for Healthcare Providers: BankingDealers.co.za  This test is not yet approved or  cleared by the [place] FDA  and has been authorized for detection and/or diagnosis of SARS-CoV-2 by FDA under an Emergency Use Authorization (EUA).  This EUA will remain in effect (meaning this test can be used) for the duration of the COVID-19 declaration under Section 564(b)(1) of the Act, 21 U.S.C. section 360bbb-3(b)(1), unless the authorization is terminated or revoked sooner.  Performed at [facility], [place] [address], [place],  [address]     Radiology Studies:  DG Chest 2 View  Result Date: 08/19/2021 CLINICAL DATA:  Chest pain and short of breath EXAM: CHEST - 2 VIEW COMPARISON:  02/20/2018 FINDINGS: Postop CABG. Heart size and vascularity normal. Lungs clear without infiltrate or effusion Anterior and posterior fusion cervical spine. IMPRESSION: No active cardiopulmonary disease. Electronically Signed   By: Franchot Gallo M.D.   On: 08/19/2021 16:30    Scheduled Meds:    [MAR Hold]  amLODipine  5 mg Oral Daily   [MAR Hold] aspirin  81 mg Oral Daily   [MAR Hold] clopidogrel  75 mg Oral Daily   [MAR Hold] ezetimibe  10 mg Oral Daily   [MAR Hold] fenofibrate  160 mg Oral Daily   [MAR Hold] ferrous sulfate  325 mg Oral Q breakfast   [MAR Hold] irbesartan  300 mg Oral Daily   And   [MAR Hold] hydrochlorothiazide  12.5 mg Oral Daily   [MAR Hold] pantoprazole  40 mg Oral Daily   [MAR Hold] ranolazine  500 mg Oral BID   [MAR Hold] sodium chloride flush  3 mL Intravenous Q12H    Continuous Infusions:    sodium chloride     sodium chloride 50 mL/hr at 08/20/21 0837   heparin 1,400 Units/hr (08/20/21 0837)     LOS: 0 days     Vernell Leep, MD,  FACP, [facility], [facility], [facility] (Care Management Physician Certified) [place]  To contact the attending provider between 7A-7P or the covering provider during after hours 7P-7A, please log into the web site [email or web address] and access using universal [place] password for that web site. If you do not have the password, please call the hospital operator.  08/20/2021, 2:25 PM

## 2021-08-21 ENCOUNTER — Other Ambulatory Visit (HOSPITAL_COMMUNITY): Payer: Self-pay

## 2021-08-21 ENCOUNTER — Encounter (HOSPITAL_COMMUNITY): Payer: Self-pay | Admitting: Cardiovascular Disease

## 2021-08-21 DIAGNOSIS — I214 Non-ST elevation (NSTEMI) myocardial infarction: Secondary | ICD-10-CM | POA: Diagnosis not present

## 2021-08-21 LAB — CBC
HCT: 32.3 % — ABNORMAL LOW (ref 39.0–52.0)
Hemoglobin: 10.7 g/dL — ABNORMAL LOW (ref 13.0–17.0)
MCH: 31.9 pg (ref 26.0–34.0)
MCHC: 33.1 g/dL (ref 30.0–36.0)
MCV: 96.4 fL (ref 80.0–100.0)
Platelets: 168 10*3/uL (ref 150–400)
RBC: 3.35 MIL/uL — ABNORMAL LOW (ref 4.22–5.81)
RDW: 14.6 % (ref 11.5–15.5)
WBC: 7.2 10*3/uL (ref 4.0–10.5)
nRBC: 0 % (ref 0.0–0.2)

## 2021-08-21 LAB — BASIC METABOLIC PANEL
Anion gap: 6 (ref 5–15)
BUN: 18 mg/dL (ref 8–23)
CO2: 23 mmol/L (ref 22–32)
Calcium: 8.6 mg/dL — ABNORMAL LOW (ref 8.9–10.3)
Chloride: 106 mmol/L (ref 98–111)
Creatinine, Ser: 1.54 mg/dL — ABNORMAL HIGH (ref 0.61–1.24)
GFR, Estimated: 44 mL/min — ABNORMAL LOW (ref >60)
Glucose, Bld: 118 mg/dL — ABNORMAL HIGH (ref 70–99)
Potassium: 3.7 mmol/L (ref 3.5–5.1)
Sodium: 135 mmol/L (ref 135–145)

## 2021-08-21 MED ORDER — ASPIRIN 81 MG PO CHEW
81.0000 mg | CHEWABLE_TABLET | Freq: Every day | ORAL | 1 refills | Status: DC
  Filled 2021-08-21: qty 30, 30d supply, fill #0

## 2021-08-21 MED ORDER — TICAGRELOR 90 MG PO TABS
90.0000 mg | ORAL_TABLET | Freq: Two times a day (BID) | ORAL | 1 refills | Status: DC
  Filled 2021-08-21: qty 60, 30d supply, fill #0

## 2021-08-21 MED ORDER — POTASSIUM CHLORIDE CRYS ER 20 MEQ PO TBCR
40.0000 meq | EXTENDED_RELEASE_TABLET | Freq: Once | ORAL | Status: AC
  Administered 2021-08-21: 40 meq via ORAL
  Filled 2021-08-21: qty 2

## 2021-08-21 NOTE — Discharge Instructions (Signed)

## 2021-08-21 NOTE — Progress Notes (Addendum)
Progress Note  Patient Name: [name] Date of Encounter: 08/21/2021  [name] Cardiologist: Glenetta Hew, MD   Subjective   Doing well this morning, no chest pain overnight.  Inpatient Medications    Scheduled Meds:  amLODipine  5 mg Oral Daily   aspirin  81 mg Oral Daily   ezetimibe  10 mg Oral Daily   fenofibrate  160 mg Oral Daily   ferrous sulfate  325 mg Oral Q breakfast   irbesartan  300 mg Oral Daily   And   hydrochlorothiazide  12.5 mg Oral Daily   pantoprazole  40 mg Oral Daily   ranolazine  500 mg Oral BID   sodium chloride flush  3 mL Intravenous Q12H   sodium chloride flush  3 mL Intravenous Q12H   ticagrelor  90 mg Oral BID   Continuous Infusions:  sodium chloride     PRN Meds: sodium chloride, acetaminophen, ondansetron (ZOFRAN) IV, sodium chloride flush   Vital Signs    Vitals:   08/20/21 1850 08/20/21 2004 08/21/21 0021 08/21/21 0455  BP: (!) 168/90 130/73 137/69 135/87  Pulse: 92 86 61 60  Resp: '18 20 18 17  ' Temp:  98.5 F (36.9 C)  98.2 F (36.8 C)  TempSrc:  Oral  Oral  SpO2: 100% 96% 97% 97%  Weight:      Height:        Intake/Output Summary (Last 24 hours) at 08/21/2021 0754 Last data filed at 08/21/2021 0456 Gross per 24 hour  Intake 925.18 ml  Output 720 ml  Net 205.18 ml   Last 3 Weights 08/19/2021 06/26/2021 05/26/2021  Weight (lbs) 215 lb 209 lb 212 lb  Weight (kg) 97.523 kg 94.802 kg 96.163 kg      Telemetry    Sinus rhythm--> sinus bradycardia - Personally Reviewed  ECG    SR, 61 bpm, first-degree AV block, T wave inversion in inferior lateral leads- Personally Reviewed  Physical Exam   GEN: No acute distress.   Neck: No JVD Cardiac: RRR, + systolic murmur, no rubs, or gallops.  Respiratory: Clear to auscultation bilaterally. GI: Soft, nontender, non-distended  MS: No edema; No deformity.  Right femoral site stable, no hematoma, moderate bruising, soft, no bruising. Neuro:  Nonfocal  Psych:  Normal affect   Labs    High Sensitivity Troponin:   Recent Labs  Lab 08/19/21 1530 08/19/21 1853  TROPONINIHS 50* 67*     Chemistry Recent Labs  Lab 08/19/21 1530 08/20/21 0427 08/21/21 0136  NA 140 137 135  K 4.2 3.8 3.7  CL 110 109 106  CO2 21* 21* 23  GLUCOSE 97 98 118*  BUN '23 18 18  ' CREATININE 1.63* 1.40* 1.54*  CALCIUM 8.8* 8.8* 8.6*  GFRNONAA 42* 50* 44*  ANIONGAP '9 7 6    ' Lipids  Recent Labs  Lab 08/20/21 0427  CHOL 152  TRIG 83  HDL 43  LDLCALC 92  CHOLHDL 3.5    Hematology Recent Labs  Lab 08/19/21 1530 08/20/21 0427 08/21/21 0136  WBC 6.6 7.1 7.2  RBC 3.70* 3.60* 3.35*  HGB 11.6* 11.3* 10.7*  HCT 36.1* 35.2* 32.3*  MCV 97.6 97.8 96.4  MCH 31.4 31.4 31.9  MCHC 32.1 32.1 33.1  RDW 14.7 14.9 14.6  PLT 190 188 168   Thyroid No results for input(s): TSH, FREET4 in the last 168 hours.  BNP Recent Labs  Lab 08/19/21 1530  BNP 119.6*    DDimer No results for input(s):  DDIMER in the last 168 hours.   Radiology    DG Chest 2 View  Result Date: 08/19/2021 CLINICAL DATA:  Chest pain and short of breath EXAM: CHEST - 2 VIEW COMPARISON:  02/20/2018 FINDINGS: Postop CABG. Heart size and vascularity normal. Lungs clear without infiltrate or effusion Anterior and posterior fusion cervical spine. IMPRESSION: No active cardiopulmonary disease. Electronically Signed   By: Franchot Gallo M.D.   On: 08/19/2021 16:30   CARDIAC CATHETERIZATION  Result Date: 08/21/2021 Images from the original result were not included.   Ost LAD to Prox LAD lesion is 100% stenosed.   Dist Cx lesion is 99% stenosed.   Previously placed Prox RCA to Mid RCA stent (unknown type) is  widely patent.   Previously placed Mid Cx to Dist Cx stent (unknown type) is  widely patent.   A drug-eluting stent was successfully placed.   Post intervention, there is a 0% residual stenosis.   Post intervention, there is a 0% residual stenosis. [name] is a 85 y.o. male  [id number]  LOCATION:  FACILITY: [facility] PHYSICIAN: Quay Burow, M.D. 11/06/1935 DATE OF PROCEDURE:  08/20/2021 DATE OF DISCHARGE: CARDIAC CATHETERIZATION / PCI DES LCX OM History obtained from chart review.85 y.o. male with a hx of CAD since 1985 with STEMI and multiple stents since and then CABG 1997, AAA and repair 08/2015, HLD,  DM-2 CHF Asthma, GERD who is being seen 08/19/2021 for the evaluation of chest pain at the request of Dr. Vanita Panda.  His last catheterization performed by myself in 2018 revealed a patent LIMA to LAD which itself was occluded in the proximal portion, patent distal circumflex obtuse marginal branch stent and patent RCA stent.  He was admitted with unstable angina and mildly elevated enzymes.  He was referred for diagnostic coronary angiography. PROCEDURE DESCRIPTION: The patient was brought to the second floor [place]iac cath lab in the postabsorptive state. He was not premedicated. His right groin was prepped and shaved in usual sterile fashion. Xylocaine 1% was used for local anesthesia. A 5 French sheath was inserted into the right common femoral artery using standard Seldinger technique.  5 French right left Judkins diagnostic catheters on the 5 French pigtail catheter were used for selective coronary angiography, selective left internal mammary artery graft angiography and obtain left heart pressures.  Isovue dye was used for the entirety of the case (95 cc of contrast total to patient).  Retrograde aortic, left ventricular and pullback pressures were recorded. The culprit lesion was the distal circumflex obtuse marginal branch stenosis just beyond the previously placed stent and originating from within the distal portion of the stent.  Patient received a total of 13,000's of heparin with an ACT of 289.  He received Brilinta 180 mg p.o. loading dose.  Isovue dye was used for the entirety of the intervention.  Retroaortic pressures monitored during the case. Using a 6 [place] XB 3.0 cm  guide catheter also 0.14 [place] guidewire and a 2 mm x 12 mm balloon the distal circumflex lesion was predilated and a 6 atm.  Following this a 2.55 x 15 mm long Medtronic frontier drug-eluting stent was then carefully positioned across the diseased segment and deployed at 16 atm.  I postdilated with a 2.75 mm x 12 mm long noncompliant balloon at 16 atm (2.8 mm) resulting reduction of a 95% distal circumflex obtuse marginal branch stenosis to 0% residual with excellent flow.  The patient tolerated procedure well.  There were no hemodynamic or  electrocardiographic sequela.  The guidewire and catheter were removed and the sheath was secured.   Successful PCI drug-eluting stenting of a distal circumflex obtuse marginal branch stenosis involving the distal edge of the previously placed stent with placement of a 2.5 x 15 mm long Medtronic frontier drug-eluting stent postdilated 2.8 mm.  There was an abnormality in the mid RCA within the stented segment that did not appear significantly different from the angiogram 4 years ago and therefore this was not addressed.  The sheath will be removed once the ACT falls below 170 and pressure held.  Patient be hydrated overnight.  He will need 12 months of uninterrupted DAPT.  He left the lab in stable condition. Quay Burow. MD, [facility] 08/20/2021 2:20 PM     Cardiac Studies   Cath: 08/20/21  Ost LAD to Prox LAD lesion is 100% stenosed.   Dist Cx lesion is 99% stenosed.   Previously placed Prox RCA to Mid RCA stent (unknown type) is  widely patent.   Previously placed Mid Cx to Dist Cx stent (unknown type) is  widely patent.   A drug-eluting stent was successfully placed.   Post intervention, there is a 0% residual stenosis.   Post intervention, there is a 0% residual stenosis.  IMPRESSION: Successful PCI drug-eluting stenting of a distal circumflex obtuse marginal branch stenosis involving the distal edge of the previously placed stent with placement of a 2.5 x  15 mm long Medtronic frontier drug-eluting stent postdilated 2.8 mm.  There was an abnormality in the mid RCA within the stented segment that did not appear significantly different from the angiogram 4 years ago and therefore this was not addressed.  The sheath will be removed once the ACT falls below 170 and pressure held.  Patient be hydrated overnight.  He will need 12 months of uninterrupted DAPT.  He left the lab in stable condition.   Quay Burow. MD, [facility] 08/20/2021 2:20 PM  Diagnostic Dominance: Right Intervention    Patient Profile     85 y.o. male with a hx of CAD since 1985 with STEMI and multiple stents since and then CABG 1997, AAA and repair 08/2015, HLD,  DM-2 CHF Asthma, GERD who was seen 08/19/2021 for the evaluation of chest pain at the request of Dr. Vanita Panda.  Assessment & Plan    NSTEMI: High-sensitivity troponin peaked at 64.  He was treated with IV heparin.  He underwent cardiac catheterization on 11/17 noted above with successful PCI/DES x1 to distal circumflex, OM branch stenosis.  Recommendations for DAPT with aspirin/Brilinta for at least 1 year.  No recurrent chest pain.  Cardiac rehab to see this morning. --Continue aspirin, Brilinta, Norvasc, Zetia, ARB/hydrochlorothiazide combo  HTN: Continue Norvasc, ARB/HCTZ -- not on BB with hx of bradycardia   HLD: Reports history of statin intolerance.  Has been on Zetia and fenofibrate --LDL 92  Hx of EVAR endovascular aneurysm repair with AAA: followed by Dr. Donzetta Matters. Recent abd/pelvis CT with possible persistent endoleak resulting in aneurysm sac enlargement. -- outpatient follow up with [facility]  Mild AS: recent echo (9/22) along with MR  CKD stage III: baseline 1.5, stable post cath  [facility] will sign off.   Medication Recommendations:  noted above Other recommendations (labs, testing, etc):  none Follow up as an outpatient:  2 weeks follow up, will arrange   For questions or updates, please contact  [place] Please consult [email or web address] for contact info under        Signed, Ria Comment  Mancel Bale, NP  08/21/2021, 7:54 AM    Patient seen and examined and history reviewed. Agree with above findings and plan. Some bruising at cath site but no hematoma. No chest pain.   Stable for DC home today from our standpoint. Will arrange follow up in our office.   Sonny Anthes [place], [place] 08/21/2021 10:52 AM

## 2021-08-21 NOTE — Discharge Summary (Signed)
Physician Discharge Summary  [name] KZS:[id number] DOB: 06-26-1936  PCP: Denita Lung, MD  Admitted from: Home Discharged to: Home  Admit date: 08/19/2021 Discharge date: 08/21/2021  Recommendations for Outpatient Follow-up:    Follow-up Information     Denita Lung, MD. Schedule an appointment as soon as possible for a visit in 1 week(s).   Specialty: Family Medicine Why: To be seen with repeat labs (CBC & BMP). Contact information: [place] [address] [phone]         Leonie Man, MD Follow up.   Specialty: Cardiology Why: MDs office will arrange office follow-up.  Please call them back if you do not hear from them in 3-5 business days. Contact information: [address] [place] [address] [place]: None    Equipment/Devices: None    Discharge Condition: Improved and stable   Code Status: Full Code Diet recommendation:  Discharge Diet Orders (From admission, onward)     Start     Ordered   08/21/21 0000  Diet - low sodium heart healthy        08/21/21 1228   08/21/21 0000  Diet Carb Modified        08/21/21 1228             Discharge Diagnoses:  Active Problems:   Type 2 diabetes mellitus in remission ([place])   Hyperlipidemia LDL goal <70; statin intolerant   CAD S/P CABG '95- several PCis since, last 05/17/14   Chronic renal insufficiency, stage III (moderate) (HCC)   Mild aortic stenosis by prior echocardiogram   Status post endovascular aneurysm repair (EVAR)   NSTEMI (non-ST elevated myocardial infarction) [facility])   Chest pain   Brief Summary: 85 y.o. male with medical history significant for CAD s/p CABG 1997, multiple prior stents, AAA s/p EVAR, PAD, chronic diastolic heart failure, type 2 diabetes, hypertension, hyperlipidemia, CKD stage III who presents with concerns of chest pain.  Chest pain started with minimal exertion 1 day  prior to admission, at central chest radiating across his chest and down his left arm, would go away after 10 to 15 minutes of rest, similar to prior episodes of angina.  Admitted for NSTEMI, initiated on IV heparin drip, prehydration for CKD and underwent cardiac cath and PCI 11/17.     Assessment & Plan:    NSTEMI in patient with history of CAD, CABG, multiple prior stents - CABG 1997.  Last PCI 2015.  Only on Plavix PTA. - Myocardial perfusion scan in 05/2021 with intermediate risk and decreased LV systolic function to 06%. - Typical angina history. - Troponin 50 > 64 - Cardiology was consulted on admission.  Started on IV heparin.  Aspirin added to Plavix. - S/p cardiac cath 11/17 (detailed report as below).  Successful PCI of drug-eluting stent of distal circumflex obtuse marginal branch stenosis.  Uninterrupted DAPT x12 months. - Cardiology follow-up appreciated.  Discussed with them today.  They have cleared him for discharge home.  Further recommendations, aspirin and Brilinta x1 year, continue prior home amlodipine, Zetia, ARB/HCT combo.  He has worked with cardiac rehab this morning.  Cardiology will arrange outpatient follow-up.  No recurrence of chest pain.  Chronic diastolic CHF - Last echo 11/3760 with EF of 45-50% with grade 1 diastolic dysfunction but Cardiology felt that it was an underestimate. - Compensated.  Essential hypertension: -  Mildly uncontrolled at times. - Continue amlodipine 5 Mg daily, ARB/HCTZ..  Close outpatient follow-up and adjust doses as needed.  Hyperlipidemia - Intolerant to statins. - Continue Zetia and fenofibrate. - LDL 92.  CKD stage III a - Baseline creatinine widely fluctuating between 1.4-1.7 over the last couple of years. - Presented with creatinine of 1.63, down to 1.4 after prehydration for cath.  Creatinine on morning of discharge was 1.5, still within his baseline range. - Follow BMP closely as outpatient.  Anemia in CKD -  Stable.  Diet controlled type II DM - A1c 5.5.  Not taking metformin PTA.   History of EVAR endovascular aneurysm repair AAA: - Followed by Dr. Donzetta Matters, vascular surgery. - Recent CT abdomen/pelvis showed possible persistent endoleak and needs to follow-up with vascular surgery as outpatient.  Mild aortic stenosis: - Recent echo 9/22 along with mitral regurgitation.  Outpatient follow-up with cardiology.   Body mass index is 31.75 kg/m.         Consultants:   Cardiology   Procedures:   Left heart cath, coronary angiography and coronary stent intervention ninths 11/17:    Ost LAD to Prox LAD lesion is 100% stenosed.   Dist Cx lesion is 99% stenosed.   Previously placed Prox RCA to Mid RCA stent (unknown type) is  widely patent.   Previously placed Mid Cx to Dist Cx stent (unknown type) is  widely patent.   A drug-eluting stent was successfully placed.   Post intervention, there is a 0% residual stenosis.   Post intervention, there is a 0% residual stenosis.    Discharge Instructions  Discharge Instructions     AMB Referral to Cardiac Rehabilitation - Phase II   Complete by: As directed    Diagnosis: NSTEMI   After initial evaluation and assessments completed: Virtual Based Care may be provided alone or in conjunction with Phase 2 Cardiac Rehab based on patient barriers.: Yes   Call MD for:  difficulty breathing, headache or visual disturbances   Complete by: As directed    Call MD for:  extreme fatigue   Complete by: As directed    Call MD for:  persistant dizziness or light-headedness   Complete by: As directed    Call MD for:  persistant nausea and vomiting   Complete by: As directed    Call MD for:  redness, tenderness, or signs of infection (pain, swelling, redness, odor or green/yellow discharge around incision site)   Complete by: As directed    Call MD for:  severe uncontrolled pain   Complete by: As directed    Call MD for:  temperature >100.4   Complete by:  As directed    Diet - low sodium heart healthy   Complete by: As directed    Diet Carb Modified   Complete by: As directed    Increase activity slowly   Complete by: As directed         Medication List     STOP taking these medications    aspirin 81 MG EC tablet Replaced by: aspirin 81 MG chewable tablet   clopidogrel 75 MG tablet Commonly known as: PLAVIX   glucose blood test strip Commonly known as: OneTouch Verio   metFORMIN 500 MG tablet Commonly known as: GLUCOPHAGE   sildenafil 20 MG tablet Commonly known as: REVATIO   triamcinolone cream 0.1 % Commonly known as: KENALOG       TAKE these medications    acetaminophen 500 MG tablet Commonly known as:  TYLENOL Take 1,000 mg by mouth every 6 (six) hours as needed for mild pain, moderate pain or headache. Reported on 03/16/2016   amLODipine 5 MG tablet Commonly known as: NORVASC Take 1 tablet by mouth once daily   aspirin 81 MG chewable tablet Chew 1 tablet (81 mg total) by mouth daily. Start taking on: August 22, 2021 Replaces: aspirin 81 MG EC tablet   ezetimibe 10 MG tablet Commonly known as: ZETIA Take 1 tablet by mouth once daily   Fenofibric Acid 135 MG Cpdr TAKE ONE CAPSULE BY MOUTH EVERY DAY What changed: how much to take   IRON PO Take 65 mg by mouth.   multivitamin with minerals tablet Take 1 tablet by mouth daily.   pantoprazole 40 MG tablet Commonly known as: PROTONIX Take 1 tablet by mouth once daily   ranolazine 500 MG 12 hr tablet Commonly known as: RANEXA Take 1 tablet by mouth twice daily   sildenafil 100 MG tablet Commonly known as: VIAGRA TAKE ONE TABLET BY MOUTH DAILY AS NEEDED FOR ERECTILE DYSFUNCTION What changed: See the new instructions.   ticagrelor 90 MG Tabs tablet Commonly known as: BRILINTA Take 1 tablet (90 mg total) by mouth 2 (two) times daily.   valsartan-hydrochlorothiazide 320-12.5 MG tablet Commonly known as: Diovan HCT Take 1 tablet by mouth  daily.       Allergies  Allergen Reactions   Oxycodone Shortness Of Breath and Cough   Lisinopril Cough   Statins Other (See Comments)    MYALGIAS    Welchol [Colesevelam Hcl] Itching      Procedures/Studies: DG Chest 2 View  Result Date: 08/19/2021 CLINICAL DATA:  Chest pain and short of breath EXAM: CHEST - 2 VIEW COMPARISON:  02/20/2018 FINDINGS: Postop CABG. Heart size and vascularity normal. Lungs clear without infiltrate or effusion Anterior and posterior fusion cervical spine. IMPRESSION: No active cardiopulmonary disease. Electronically Signed   By: Franchot Gallo M.D.   On: 08/19/2021 16:30   CARDIAC CATHETERIZATION  Result Date: 08/21/2021 Images from the original result were not included.   Ost LAD to Prox LAD lesion is 100% stenosed.   Dist Cx lesion is 99% stenosed.   Previously placed Prox RCA to Mid RCA stent (unknown type) is  widely patent.   Previously placed Mid Cx to Dist Cx stent (unknown type) is  widely patent.   A drug-eluting stent was successfully placed.   Post intervention, there is a 0% residual stenosis.   Post intervention, there is a 0% residual stenosis. [name] is a 85 y.o. male  [id number] LOCATION:  FACILITY: [facility] PHYSICIAN: Quay Burow, M.D. 30-Jan-1936 DATE OF PROCEDURE:  08/20/2021 DATE OF DISCHARGE: CARDIAC CATHETERIZATION / PCI DES LCX OM History obtained from chart review.85 y.o. male with a hx of CAD since 1985 with STEMI and multiple stents since and then CABG 1997, AAA and repair 08/2015, HLD,  DM-2 CHF Asthma, GERD who is being seen 08/19/2021 for the evaluation of chest pain at the request of Dr. Vanita Panda.  His last catheterization performed by myself in 2018 revealed a patent LIMA to LAD which itself was occluded in the proximal portion, patent distal circumflex obtuse marginal branch stent and patent RCA stent.  He was admitted with unstable angina and mildly elevated enzymes.  He was referred for diagnostic coronary angiography.  PROCEDURE DESCRIPTION: The patient was brought to the second floor [facility] cath lab in the postabsorptive state. He was not premedicated. His right groin was prepped  and shaved in usual sterile fashion. Xylocaine 1% was used for local anesthesia. A 5 French sheath was inserted into the right common femoral artery using standard Seldinger technique.  5 French right left Judkins diagnostic catheters on the 5 French pigtail catheter were used for selective coronary angiography, selective left internal mammary artery graft angiography and obtain left heart pressures.  Isovue dye was used for the entirety of the case (95 cc of contrast total to patient).  Retrograde aortic, left ventricular and pullback pressures were recorded. The culprit lesion was the distal circumflex obtuse marginal branch stenosis just beyond the previously placed stent and originating from within the distal portion of the stent.  Patient received a total of 13,000's of heparin with an ACT of 289.  He received Brilinta 180 mg p.o. loading dose.  Isovue dye was used for the entirety of the intervention.  Retroaortic pressures monitored during the case. Using a 6 [place] XB 3.0 cm guide catheter also 0.14 [place] guidewire and a 2 mm x 12 mm balloon the distal circumflex lesion was predilated and a 6 atm.  Following this a 2.55 x 15 mm long Medtronic frontier drug-eluting stent was then carefully positioned across the diseased segment and deployed at 16 atm.  I postdilated with a 2.75 mm x 12 mm long noncompliant balloon at 16 atm (2.8 mm) resulting reduction of a 95% distal circumflex obtuse marginal branch stenosis to 0% residual with excellent flow.  The patient tolerated procedure well.  There were no hemodynamic or electrocardiographic sequela.  The guidewire and catheter were removed and the sheath was secured.   Successful PCI drug-eluting stenting of a distal circumflex obtuse marginal branch stenosis involving the distal edge of  the previously placed stent with placement of a 2.5 x 15 mm long Medtronic frontier drug-eluting stent postdilated 2.8 mm.  There was an abnormality in the mid RCA within the stented segment that did not appear significantly different from the angiogram 4 years ago and therefore this was not addressed.  The sheath will be removed once the ACT falls below 170 and pressure held.  Patient be hydrated overnight.  He will need 12 months of uninterrupted DAPT.  He left the lab in stable condition. Quay Burow. MD, [facility] 08/20/2021 2:20 PM       Subjective: Patient denies complaints.  No chest pain, dyspnea or any other complaints reported.  Discharge Exam:  Vitals:   08/20/21 1850 08/20/21 2004 08/21/21 0021 08/21/21 0455  BP: (!) 168/90 130/73 137/69 135/87  Pulse: 92 86 61 60  Resp: '18 20 18 17  ' Temp:  98.5 F (36.9 C)  98.2 F (36.8 C)  TempSrc:  Oral  Oral  SpO2: 100% 96% 97% 97%  Weight:      Height:        General exam: Elderly male, lying comfortably propped up in bed without distress. Respiratory system: Clear to auscultation. Respiratory effort normal.  Midline sternotomy scar. Cardiovascular system: S1 & S2 heard, RRR. No JVD, murmurs, rubs, gallops or clicks. No pedal edema.  Telemetry personally reviewed: SR-SB in the 5s. Gastrointestinal system: Abdomen is nondistended, soft and nontender. No organomegaly or masses felt. Normal bowel sounds heard. Central nervous system: Alert and oriented. No focal neurological deficits. Extremities: Symmetric 5 x 5 power. Skin: Right groin cath site with mild bruising but no hematoma or active bleeding noted.  Cardiology team aware Psychiatry: Judgement and insight appear normal. Mood & affect appropriate.     The results  of significant diagnostics from this hospitalization (including imaging, microbiology, ancillary and laboratory) are listed below for reference.     Microbiology: Recent Results (from the past 240 hour(s))  SARS  Coronavirus 2 by RT PCR (hospital order, performed in [facility] hospital lab) Nasopharyngeal Nasopharyngeal Swab     Status: None   Collection Time: 08/20/21  4:25 AM   Specimen: Nasopharyngeal Swab  Result Value Ref Range Status   SARS Coronavirus 2 NEGATIVE NEGATIVE Final    Comment: (NOTE) SARS-CoV-2 target nucleic acids are NOT DETECTED.  The SARS-CoV-2 RNA is generally detectable in upper and lower respiratory specimens during the acute phase of infection. The lowest concentration of SARS-CoV-2 viral copies this assay can detect is 250 copies / mL. A negative result does not preclude SARS-CoV-2 infection and should not be used as the sole basis for treatment or other patient management decisions.  A negative result may occur with improper specimen collection / handling, submission of specimen other than nasopharyngeal swab, presence of viral mutation(s) within the areas targeted by this assay, and inadequate number of viral copies (<250 copies / mL). A negative result must be combined with clinical observations, patient history, and epidemiological information.  Fact Sheet for Patients:   StrictlyIdeas.no  Fact Sheet for Healthcare Providers: BankingDealers.co.za  This test is not yet approved or  cleared by the [place] FDA and has been authorized for detection and/or diagnosis of SARS-CoV-2 by FDA under an Emergency Use Authorization (EUA).  This EUA will remain in effect (meaning this test can be used) for the duration of the COVID-19 declaration under Section 564(b)(1) of the Act, 21 U.S.C. section 360bbb-3(b)(1), unless the authorization is terminated or revoked sooner.  Performed at [facility], [place] [address], [place], [place] [address]      Labs: CBC: Recent Labs  Lab 08/19/21 1530 08/20/21 0427 08/21/21 0136  WBC 6.6 7.1 7.2  HGB 11.6* 11.3* 10.7*  HCT 36.1* 35.2* 32.3*  MCV 97.6 97.8 96.4  PLT  190 188 604    Basic Metabolic Panel: Recent Labs  Lab 08/19/21 1530 08/20/21 0427 08/21/21 0136  NA 140 137 135  K 4.2 3.8 3.7  CL 110 109 106  CO2 21* 21* 23  GLUCOSE 97 98 118*  BUN '23 18 18  ' CREATININE 1.63* 1.40* 1.54*  CALCIUM 8.8* 8.8* 8.6*      Hgb A1c Recent Labs    08/20/21 0427  HGBA1C 5.5    Lipid Profile Recent Labs    08/20/21 0427  CHOL 152  HDL 43  LDLCALC 92  TRIG 83  CHOLHDL 3.5    I discussed in detail with patient's spouse via phone, updated care and answered all questions  Time coordinating discharge: 25 minutes  SIGNED:  Vernell Leep, MD,  FACP, [facility], [facility], [facility] (Care Management Physician Certified). [facility] Advisor  To contact the attending provider between 7A-7P or the covering provider during after hours 7P-7A, please log into the web site [email or web address] and access using universal [place] password for that web site. If you do not have the password, please call the hospital operator.

## 2021-08-21 NOTE — Progress Notes (Signed)
CARDIAC REHAB PHASE I   PRE:  Rate/Rhythm: 32 SR  BP:  Sitting: 144/84      SaO2: 100 RA  MODE:  Ambulation: 200 ft   POST:  Rate/Rhythm: 110 ST  BP:  Sitting: 140/111    SaO2: 100 RA   Pt ambulated 275ft in hallway assist of one with slow gait. Pt denies CP, SOB, or dizziness. Pt educated on importance of ASA, and Brilinta. Pt given heart healthy and diabetic diets. Reviewed site care, restrictions, and exercise guidelines, with emphasis on safety. Will refer to CRP II [place] to meet requirements, but pt not interested in attending.  [id number] Rufina Falco, RN BSN 08/21/2021 10:01 AM

## 2021-08-21 NOTE — Progress Notes (Signed)
Patient discharging home. Vital signs stable as refected in discharge summary. Discharge instructions given and verbal understanding returned. Patient discharging with prescriptions filled by TOC. No questions at this time.

## 2021-08-24 ENCOUNTER — Telehealth: Payer: Self-pay

## 2021-08-24 LAB — POCT ACTIVATED CLOTTING TIME: Activated Clotting Time: 167 seconds

## 2021-08-24 MED FILL — Nitroglycerin IV Soln 100 MCG/ML in D5W: INTRA_ARTERIAL | Qty: 10 | Status: AC

## 2021-08-24 NOTE — Telephone Encounter (Signed)
Transition Care Management Follow-up Telephone Call Date of discharge and from where: [name] 08/21/21 How have you been since you were released from the hospital? [place] Any questions or concerns? No  Items Reviewed: Did the pt receive and understand the discharge instructions provided? Yes  Medications obtained and verified? Yes  Other? No  Any new allergies since your discharge? No  Dietary orders reviewed? No Do you have support at home? Yes   Home Care and Equipment/Supplies: Were home health services ordered? no  Functional Questionnaire: (I = Independent and D = Dependent) ADLs: I  Bathing/Dressing- I  Meal Prep- I  Eating- I  Maintaining continence- I  Transferring/Ambulation- I  Managing Meds- I  Follow up appointments reviewed:  PCP Hospital f/u appt confirmed? Yes  Scheduled to see Dr. Redmond School on 08/26/21 @ 11:15. [facility] f/u appt confirmed? Yes  Scheduled to see Cardiology on 09/16/21 @ 2:45. Are transportation arrangements needed? No  If their condition worsens, is the pt aware to call PCP or go to the Emergency Dept.? Yes Was the patient provided with contact information for the PCP's office or ED? Yes Was to pt encouraged to call back with questions or concerns? Yes

## 2021-08-26 ENCOUNTER — Other Ambulatory Visit: Payer: Self-pay

## 2021-08-26 ENCOUNTER — Ambulatory Visit (INDEPENDENT_AMBULATORY_CARE_PROVIDER_SITE_OTHER): Payer: Medicare Other | Admitting: Family Medicine

## 2021-08-26 VITALS — BP 122/70 | HR 74 | Temp 96.7°F | Wt 199.2 lb

## 2021-08-26 DIAGNOSIS — I214 Non-ST elevation (NSTEMI) myocardial infarction: Secondary | ICD-10-CM | POA: Diagnosis not present

## 2021-08-26 DIAGNOSIS — E785 Hyperlipidemia, unspecified: Secondary | ICD-10-CM

## 2021-08-26 DIAGNOSIS — T466X5A Adverse effect of antihyperlipidemic and antiarteriosclerotic drugs, initial encounter: Secondary | ICD-10-CM

## 2021-08-26 NOTE — Progress Notes (Signed)
   Subjective:    Patient ID: [name], male    DOB: 1935-12-02, 85 y.o.   MRN: [id number]  HPI He is here for posthospitalization follow-up .  He was admitted on November 16 and sent home on the 18th.  He did have a non-STEMI and did have a catheterization done.  He has a previous history of CABG as well as stenting.  Prior to admission he did have difficulty with chest tightness weakness and shortness of breath with radiation into the arms.  He did note that the symptoms went away as soon as the blockage was stented.  Of note is the fact that he does have statin intolerance.  He is on a fenofibrate and Zetia.  His medications were reviewed.   Review of Systems     Objective:   Physical Exam Alert and in no distress.  Cardiac exam shows regular rhythm without murmurs or gallops.  The hospital record including lab x-rays and cath report was reviewed with him.       Assessment & Plan:  NSTEMI (non-ST elevated myocardial infarction) ([place]) - Plan: CBC with Differential/Platelet, Comprehensive metabolic panel  Adverse reaction to antihyperlipidemic drug, initial encounter  Hyperlipidemia LDL goal <70; statin intolerant I reinforced the fact that he should not plate before calling an ambulance.  Reviewed symptoms of heart disease with him in terms of chest pain, shortness of breath, weakness, diaphoresis.  Reinforced the need for him to continue on the present medication and also start a walking program as per recommendations of cardiology.  Also discussed cholesterol management and mention the possibility of using Repatha.  Encouraged him to discuss that further with cardiology.  Over 40 minutes spent in counseling and coordination of care and review of data.

## 2021-08-27 LAB — CBC WITH DIFFERENTIAL/PLATELET
Basophils Absolute: 0.1 10*3/uL (ref 0.0–0.2)
Basos: 1 %
EOS (ABSOLUTE): 0.1 10*3/uL (ref 0.0–0.4)
Eos: 1 %
Hematocrit: 37.2 % — ABNORMAL LOW (ref 37.5–51.0)
Hemoglobin: 12 g/dL — ABNORMAL LOW (ref 13.0–17.7)
Immature Grans (Abs): 0 10*3/uL (ref 0.0–0.1)
Immature Granulocytes: 1 %
Lymphocytes Absolute: 1.3 10*3/uL (ref 0.7–3.1)
Lymphs: 24 %
MCH: 31.3 pg (ref 26.6–33.0)
MCHC: 32.3 g/dL (ref 31.5–35.7)
MCV: 97 fL (ref 79–97)
Monocytes Absolute: 0.5 10*3/uL (ref 0.1–0.9)
Monocytes: 9 %
Neutrophils Absolute: 3.6 10*3/uL (ref 1.4–7.0)
Neutrophils: 64 %
Platelets: 224 10*3/uL (ref 150–450)
RBC: 3.84 x10E6/uL — ABNORMAL LOW (ref 4.14–5.80)
RDW: 13.5 % (ref 11.6–15.4)
WBC: 5.5 10*3/uL (ref 3.4–10.8)

## 2021-08-27 LAB — COMPREHENSIVE METABOLIC PANEL
ALT: 19 IU/L (ref 0–44)
AST: 24 IU/L (ref 0–40)
Albumin/Globulin Ratio: 1.8 (ref 1.2–2.2)
Albumin: 4.6 g/dL (ref 3.6–4.6)
Alkaline Phosphatase: 46 IU/L (ref 44–121)
BUN/Creatinine Ratio: 16 (ref 10–24)
BUN: 34 mg/dL — ABNORMAL HIGH (ref 8–27)
Bilirubin Total: 0.7 mg/dL (ref 0.0–1.2)
CO2: 20 mmol/L (ref 20–29)
Calcium: 10.1 mg/dL (ref 8.6–10.2)
Chloride: 102 mmol/L (ref 96–106)
Creatinine, Ser: 2.09 mg/dL — ABNORMAL HIGH (ref 0.76–1.27)
Globulin, Total: 2.5 g/dL (ref 1.5–4.5)
Glucose: 119 mg/dL — ABNORMAL HIGH (ref 70–99)
Potassium: 4.6 mmol/L (ref 3.5–5.2)
Sodium: 137 mmol/L (ref 134–144)
Total Protein: 7.1 g/dL (ref 6.0–8.5)
eGFR: 31 mL/min/{1.73_m2} — ABNORMAL LOW (ref >59)

## 2021-09-02 LAB — HGB A1C W/O EAG: Hgb A1c MFr Bld: 5.6 % (ref 4.8–5.6)

## 2021-09-02 LAB — SPECIMEN STATUS REPORT

## 2021-09-04 ENCOUNTER — Other Ambulatory Visit (HOSPITAL_COMMUNITY): Payer: Self-pay

## 2021-09-04 ENCOUNTER — Telehealth (HOSPITAL_COMMUNITY): Payer: Self-pay

## 2021-09-04 NOTE — Telephone Encounter (Signed)
Pharmacy Transitions of Care Follow-up Telephone Call  Date of discharge: 08/21/21  Discharge Diagnosis: NSTEMI  How have you been since you were released from the hospital?  Patient doing well since discharge, no questions about meds at this time.  Medication changes made at discharge:     START taking: Aspirin Low Dose (aspirin)  Brilinta (ticagrelor)  STOP taking: aspirin 81 MG EC tablet  Replaced by a similar medication. clopidogrel 75 MG tablet (PLAVIX)  glucose blood test strip (OneTouch Verio)  metFORMIN 500 MG tablet (GLUCOPHAGE)  sildenafil 20 MG tablet (REVATIO)  triamcinolone cream 0.1 % (KENALOG)   Medication changes verified by the patient? Yes    Medication Accessibility:  Home Pharmacy:  [facility] [address] [facility]   Was the patient provided with refills on discharged medications? Yes   Have all prescriptions been transferred from [facility] to home pharmacy?  Yes  Is the patient able to afford medications? Has insurance    Medication Review:  TICAGRELOR (BRILINTA) Ticagrelor 90 mg BID initiated on 08/21/21.  - Educated patient on expected duration of therapy of aspirin with ticagrelor.  - Discussed importance of taking medication around the same time every day, - Advised patient of medications to avoid (NSAIDs, aspirin maintenance doses>100 mg daily) - Educated that Tylenol (acetaminophen) will be the preferred analgesic to prevent risk of bleeding  - Emphasized importance of monitoring for signs and symptoms of bleeding (abnormal bruising, prolonged bleeding, nose bleeds, bleeding from gums, discolored urine, black tarry stools)  - Educated patient to notify doctor if shortness of breath or abnormal heartbeat occur - Advised patient to alert all providers of antiplatelet therapy prior to starting a new medication or having a procedure   Follow-up Appointments:  PCP Hospital f/u appt confirmed? Seen by Dr. Redmond School on 08/26/21.   [facility] f/u  appt confirmed? Scheduled to see Dr. Rob Hickman on 09/16/21 @ 2:45pm.   If their condition worsens, is the pt aware to call PCP or go to the Emergency Dept.? yes  Final Patient Assessment: Patient has follow up scheduled and refills at home pharmacy

## 2021-09-15 NOTE — Progress Notes (Signed)
Cardiology Office Note:    Date:  09/16/2021   ID:  [name], DOB [birth year], MRN [id number]  PCP:  Denita Lung, MD  Cardiologist:  Glenetta Hew, MD   Referring MD: Denita Lung, MD   Chief Complaint  Patient presents with   Follow-up    Recent PCI    History of Present Illness:    RAYMONT [name] is a 85 y.o. male with a hx of extensive cardiac history including CAD since 1985 with multiple stenting and ultimately a CABG in 1997 with patent LIMA-LAD, DM2, hypertension, CHF, EVAR for AAA followed by Dr. Donzetta Matters, asthma, GERD, and hyperlipidemia.  Heart cath in 05/2014 with DES to ISR of RCA and OM1.  Nuclear stress test in 05/2021 with intermediate risk and similar findings still prior study.  Echo 06/10/2021 showed LVEF with 45 to 50% and grade 1 diastolic dysfunction, WMA, mildly reduced RV function, severely dilated left atrium, mild to moderate MR, and mild AS.  He has been on Plavix monotherapy and is intolerant to statins.  He has not been interested in PCSK9 inhibitor or Nexletol.  He is maintained on Zetia and fenofibrate.  Antianginal regimen includes amlodipine and ranolazine.  He has CKD with serum creatinine 1.54.  He presented to [facility] ED on 08/19/2021 with chest pain.  He underwent left heart catheterization with continued patency of the LIMA-LAD but distal circumflex disease treated with DES.  He was started on aspirin and Brilinta.  He is not on a beta-blocker due to history of bradycardia.  Recent CT abdomen pelvis with possible persistent endoleak resulting in aneurysmal sac enlargement.  He was advised to follow-up with the [facility] as outpatient.  He presents for outpatient follow-up, TOC post cath. Weakness is his anginal equivalent. He presents today with persistent shortness of breath. He does not have his energy back 100% but feels better than prior to PCI. He is not walking much due to back and knee pain. He can gather/bring wood in for the fireplace.    Past  Medical History:  Diagnosis Date   Adenomatous colon polyp    Ankle edema    Chronic   Asthma    CAD S/P percutaneous coronary angioplasty 2004   a) '04: Staged Taxus DES PCI to RCA (2 Taxus 2.75 x 32 & 12) and Cx-OM (Taxus 3 x 20);;'07 - PCI to pRCA ISR- Cypher DES; c) 05/2014: pPCI pRCA stentISR (Promus DES 3 x 8) OM1 distal to stent (Promus DES 2.75 x 12 - 3.1)   CAD, multiple vessel 1985   Most recent July 2018: Admitted for non-STEMI. Occluded LAD with patent LIMA (SP1 now occluded).  Patent stents in proximal and mid RCA as well as circumflex-OM 3. Known occlusion of SVG-OM. EF 45-50%   CHF (congestive heart failure) ([place])    Cholelithiasis - with cholangitis & choledocholithiasis    status post ERCP with removal of calculi and biliary stent placement.   Chronic anemia    On iron supplement; history of positive guaiac - negative colonoscopy in 1996.; Thought to be related to hemorrhoids; status post hemorrhoidectomy   Chronic back pain     multiple surgeries; C-spine and lumbar   Diabetes mellitus    on oral medication   Diverticulitis of colon 1996   Diverticulosis    Dyslipidemia, goal LDL below 70    Erectile dysfunction    Exertional dyspnea    Chronic baseline SOB with ambulation   GERD (gastroesophageal reflux disease)  Grade II diastolic dysfunction    H/O: pneumonia 11/2012   Hemorrhoids    Hiatal hernia    History of: ST elevation myocardial infarction (STEMI) involving left circumflex coronary artery with complication 9937   PTCA-circumflex; PCI in 1991   Hypertension    Moderate aortic stenosis by prior echocardiogram 07/2015   Normal LV function - EF 55-60%.. Abnormal relaxation. Mild-moderate aortic stenosis (peak/mean gradient 18/10 mmH)   Osteoarthritis of both knees    And back; multiple back surgeries, right knee arthroplasty and left knee arthroscopic surgery x2   Pneumonia 2016   S/P AAA (abdominal aortic aneurysm) repair 08/30/2012   s/p EVAR   S/P  CABG x 2 1997   LIMA-LAD, SVG-OM   Sleep apnea    pt. states he was told to return for f/u, to be fitted for Cpap, but pt. reports that he didn't follow up-no cpap used   Statin myopathy 09/30/2015    Past Surgical History:  Procedure Laterality Date   Abdominal and Lower Extremity Arterial Ultrasound  08/23/2012; 10/10/2013   Normal ABIs. Nonocclusive lower extremity disease. 4.2 cm x 4.3 cm infrarenal AAA;; 4.4 cm x 4.3 cm (essentially stable)    ABDOMINAL AORTIC ENDOVASCULAR STENT GRAFT N/A 08/13/2015   Procedure: ABDOMINAL AORTIC ENDOVASCULAR STENT GRAFT;  Surgeon: Mal Misty, MD;  Location: [facility] OR;  Service: Vascular;  Laterality: N/A;   Anterior cervical plating  04/23/2010   At C4-5 and a C6-7 utilizing two separate Biomet MaxAn plates.   ANTERIOR LAT LUMBAR FUSION Left 11/22/2012   Procedure: ANTERIOR LATERAL LUMBAR FUSION 1 LEVEL;  Surgeon: Eustace Moore, MD;  Location: [place] [facility] OR[facility];  Service: Neurosurgery;  Laterality: Left;  Anterior Lateral Lumbar Fusion Lumbar Three-Four   BACK SURGERY  1979 & x 10   pt. remarks, "I have had about 10 back surgeries"   BILIARY STENT PLACEMENT N/A 07/03/2014   Procedure: BILIARY STENT PLACEMENT;  Surgeon: Inda Castle, MD;  Location: [place];  Service: Endoscopy;  Laterality: N/A;   CARDIAC CATHETERIZATION  06/2003   None Occluded vein graft to OM; diffuse RCA disease in the mid vessel, 80% circumflex-OM stenosis; follow on AV groove circumflex with sequential 90% stenoses and intervening saccular dilation    CARDIAC CATHETERIZATION  12/2008   4 abnormal Myoview showing apical thinning (possibly due to apical LAD 95%) : 100% Occluded LAD after SV1, distal LAD grafted via LIMA -apical 95% . Cx -OM1 w/patent stent extending into OM 1 . Follow on Cx - 70-80% - non-amenable PCI. RCA widely patent 3 overlapping stents in mRCA w/less than 40% stenosisin RPL; SVG-OM known occluded    CARPAL TUNNEL RELEASE Left 11/16/2019   Procedure: LEFT  CARPAL TUNNEL RELEASE;  Surgeon: Mcarthur Rossetti, MD;  Location: [facility];  Service: Orthopedics;  Laterality: Left;   CATARACT EXTRACTION     Cervical arthrodesis  04/23/2010   Anterior cervical arthrodesis, C4-5, C6-7 utilizing 7-mm PEEK interbody cage packed with local autograft & Antifuse putty at C4-5 & an 8-mm cage at C6-7.   CERVICAL DISCECTOMY  04/23/2010   Decompressive anterior carvical diskectomy. C4-5, C6-7   CHOLECYSTECTOMY N/A 05/23/2015   Procedure: LAPAROSCOPIC CHOLECYSTECTOMY WITH INTRAOPERATIVE CHOLANGIOGRAM;  Surgeon: Alphonsa Overall, MD;  Location: [facility];  Service: General;  Laterality: N/A;   [place]  1985,1991,15   1985 lateral STEMI Circumflex PTCA;    CORONARY ARTERY BYPASS GRAFT  1997   LIMA-LAD, SVG-OM (SVG known to be  occluded prior to 2004)   CORONARY STENT INTERVENTION N/A 08/20/2021   Procedure: CORONARY STENT INTERVENTION;  Surgeon: Lorretta Harp, MD;  Location: [place] CV LAB;  Service: Cardiovascular;  Laterality: N/A;   ERCP N/A 07/03/2014   Procedure: ENDOSCOPIC RETROGRADE CHOLANGIOPANCREATOGRAPHY (ERCP);  Surgeon: Inda Castle, MD;  Location: [place];  Service: Endoscopy;  Laterality: N/A;   ERCP N/A 07/05/2014   Procedure: ENDOSCOPIC RETROGRADE CHOLANGIOPANCREATOGRAPHY (ERCP);  Surgeon: Inda Castle, MD;  Location: [place];  Service: Endoscopy;  Laterality: N/A;   ERCP N/A 06/30/2015   Procedure: ENDOSCOPIC RETROGRADE CHOLANGIOPANCREATOGRAPHY (ERCP);  Surgeon: Ladene Artist, MD;  Location: [facility];  Service: Endoscopy;  Laterality: N/A;   EYE SURGERY     IR ANGIOGRAM EXTREMITY LEFT  01/18/2017   IR ANGIOGRAM PELVIS SELECTIVE OR SUPRASELECTIVE  01/18/2017   IR ANGIOGRAM SELECTIVE EACH ADDITIONAL VESSEL  01/18/2017   IR AORTAGRAM ABDOMINAL SERIALOGRAM  01/18/2017   IR EMBO ARTERIAL NOT HEMORR HEMANG INC GUIDE ROADMAPPING  01/18/2017   IR RADIOLOGIST EVAL & MGMT  01/04/2017   IR  RADIOLOGIST EVAL & MGMT  10/26/2017   IR RADIOLOGIST EVAL & MGMT  05/24/2018   IR RADIOLOGIST EVAL & MGMT  06/19/2019   IR RADIOLOGIST EVAL & MGMT  06/11/2020   IR RADIOLOGIST EVAL & MGMT  07/02/2021   IR US GUIDE VASC ACCESS RIGHT  01/18/2017   KNEE ARTHROSCOPY Left    x 2   LEFT HEART CATH AND CORS/GRAFTS ANGIOGRAPHY N/A 04/20/2017   Procedure: Left Heart Cath and Cors/Grafts Angiography;  Surgeon: Lorretta Harp, MD;  Location: [place] CV LAB;  LAD now occluded prior to SP1.LIMA-LAD. Patent RCA and circumflex stents. EF 45-50%. Relatively stable.    LEFT HEART CATH AND CORS/GRAFTS ANGIOGRAPHY N/A 08/20/2021   Procedure: LEFT HEART CATH AND CORS/GRAFTS ANGIOGRAPHY;  Surgeon: Lorretta Harp, MD;  Location: [place] CV LAB;  Service: Cardiovascular;  Laterality: N/A;   LEFT HEART CATHETERIZATION WITH CORONARY ANGIOGRAM N/A 05/15/2014   Procedure: LEFT HEART CATHETERIZATION WITH CORONARY ANGIOGRAM;  Surgeon: Sinclair Grooms, MD;  Location: [facility] CATH LAB;  Service: Cardiovascular;  Laterality: N/A;   LEFT HEART CATHETERIZATION WITH CORONARY/GRAFT ANGIOGRAM N/A 06/28/2014   Procedure: LEFT HEART CATHETERIZATION WITH Beatrix Fetters;  Surgeon: Troy Sine, MD;  Location: [facility] CATH LAB;  Service: Cardiovascular;  Laterality: N/A;   LUMBAR PERCUTANEOUS PEDICLE SCREW 1 LEVEL N/A 11/22/2012   Procedure: LUMBAR PERCUTANEOUS PEDICLE SCREW 1 LEVEL;  Surgeon: Eustace Moore, MD;  Location: [place] [facility]UR[facility] ORS;  Service: Neurosurgery;  Laterality: N/A;  Lumbar Three-Four Percutaneous Pedicle Screw, Lateral approach   MINOR HEMORRHOIDECTOMY     NM MYOVIEW LTD  05/26/2021   Lexiscan: Intermediate risk (similar findings to last study), with exception of reduced EF roughly 40%..  Prior basal inferior-inferolateral infarct without peri-infarct ischemia.   ->  Echo ordered -> (EF 45 to 50%)-when compared to prior Myoview, infarct appears similar.   PERCUTANEOUS CORONARY STENT INTERVENTION (PCI-S)   06/2003   PCI - RCA 2 overlapping Taxus DES 2.75 mm x 32 mm and 2.75 mm x 12 mm (3.0 mm); PCI-Cx-OM1 - Taxus DES 3.0 mm x 20 mm (3.1 mm);    PERCUTANEOUS CORONARY STENT INTERVENTION (PCI-S)  12/2005   80% ISR in proximal Taxus stent in RCA -- covered proximally with Cypher DES 3.0 mm x 12 mm   PERCUTANEOUS CORONARY STENT INTERVENTION (PCI-S) N/A 05/17/2014   Procedure: PERCUTANEOUS CORONARY STENT INTERVENTION (PCI-S);  Surgeon:  Sinclair Grooms, MD;  Location: [facility] CATH LAB: PCI pRCA stent ISR - Promus DES 3.0 x 8 (3.25), OM distal stent edge - Promus DES 2.75 x 12 (3.1)   PERIPHERAL VASCULAR CATHETERIZATION  11/03/2016   Procedure: Embolization;  Surgeon: Waynetta Sandy, MD;  Location: [place] CV LAB;  Service: Cardiovascular;;   POSTERIOR CERVICAL FUSION/FORAMINOTOMY N/A 05/02/2013   Procedure: POSTERIOR CERVICAL FUSION/FORAMINOTOMY CERVICAL SEVEN THORACIC-ONE;  Surgeon: Eustace Moore, MD;  Location: [place] [facility] OR[facility];  Service: Neurosurgery;  Laterality: N/A;  POSTERIOR CERVICAL FUSION/FORAMINOTOMY CERVICAL SEVEN THORACIC-ONE   SPHINCTEROTOMY N/A 06/30/2015   Procedure: SPHINCTEROTOMY;  Surgeon: Ladene Artist, MD;  Location: [facility];  Service: Endoscopy;  Laterality: N/A;   TOTAL KNEE ARTHROPLASTY Right    TOTAL KNEE ARTHROPLASTY Left 12/20/2016   Procedure: LEFT TOTAL KNEE ARTHROPLASTY;  Surgeon: Vickey Huger, MD;  Location: [place];  Service: Orthopedics;  Laterality: Left;   TRANSTHORACIC ECHOCARDIOGRAM  06/10/2021   EF 45 to 50%.  Mildly reduced function.  Mild hypokinesis of basal-mid inferior and inferolateral wall consistent with prior infarct.  Severe LA dilation.  Mild to moderate MR with moderate MAC.  Mild calcific AS (mean gradient 11 mmHg) --> LV function seems unchanged.  Diastolic function improved.  Wall motion abnormality unchanged.  AS unchanged.   TRANSTHORACIC ECHOCARDIOGRAM  07/2018   Normal LV size.  EF 50 to 55% with mild HK of inferolateral wall.  GRII DD.   Mild AS with mean gradient 15 mm or greater.  Mild ascending aortic dilation.  Mildly increased PA pressures of 41 mmHg   UPPER GI ENDOSCOPY  07/03/2019   VISCERAL ANGIOGRAM  11/03/2016   Procedure: Visceral Angiogram;  Surgeon: Waynetta Sandy, MD;  Location: [place] CV LAB;  Service: Cardiovascular;;    Current Medications: Current Meds  Medication Sig   acetaminophen (TYLENOL) 500 MG tablet Take 1,000 mg by mouth every 6 (six) hours as needed for mild pain, moderate pain or headache. Reported on 03/16/2016   amLODipine (NORVASC) 5 MG tablet Take 1 tablet by mouth once daily   aspirin 81 MG chewable tablet Chew 1 tablet (81 mg total) by mouth daily.   Choline Fenofibrate (FENOFIBRIC ACID) 135 MG CPDR TAKE ONE CAPSULE BY MOUTH EVERY DAY   ezetimibe (ZETIA) 10 MG tablet Take 1 tablet by mouth once daily   Ferrous Sulfate (IRON PO) Take 65 mg by mouth.   Multiple Vitamins-Minerals (MULTIVITAMIN WITH MINERALS) tablet Take 1 tablet by mouth daily.   pantoprazole (PROTONIX) 40 MG tablet Take 1 tablet by mouth once daily   ranolazine (RANEXA) 500 MG 12 hr tablet Take 1 tablet by mouth twice daily   sildenafil (VIAGRA) 100 MG tablet TAKE ONE TABLET BY MOUTH DAILY AS NEEDED FOR ERECTILE DYSFUNCTION   ticagrelor (BRILINTA) 90 MG TABS tablet Take 1 tablet (90 mg total) by mouth 2 (two) times daily.   valsartan-hydrochlorothiazide (DIOVAN HCT) 320-12.5 MG tablet Take 1 tablet by mouth daily.     Allergies:   Oxycodone, Lisinopril, Statins, and Welchol [colesevelam hcl]   Social History   Socioeconomic History   Marital status: Married    Spouse name: Not on file   Number of children: Not on file   Years of education: Not on file   Highest education level: Not on file  Occupational History   Not on file  Tobacco Use   Smoking status: Former    Packs/day: 3.00    Years: 45.00  Pack years: 135.00    Types: Cigarettes    Quit date: 10/05/1983    Years since quitting: 37.9    Smokeless tobacco: Never  Vaping Use   Vaping Use: Never used  Substance and Sexual Activity   Alcohol use: Yes    Alcohol/week: 4.0 standard drinks    Types: 4 Glasses of wine per week    Comment: 2 or 3 beers per day and more on the weekend   Drug use: No   Sexual activity: Not Currently  Other Topics Concern   Not on file  Social History Narrative   He is married, father of two, grandfather to 17, great grandfather to two.    Not really getting much exercise now, do to his significant back and hip pain.    He does not smoke and only has an alcoholic beverage.    Social Determinants of Health   Financial Resource Strain: Not on file  Food Insecurity: Not on file  Transportation Needs: Not on file  Physical Activity: Not on file  Stress: Not on file  Social Connections: Not on file     Family History: The patient's family history includes Arthritis in his mother; Breast cancer in his sister; Diabetes in his father and sister; Heart disease in his brother, father, and sister; Hypertension in his sister; Stroke in his sister; Ulcers in his brother. There is no history of Colon cancer.  ROS:   Please see the history of present illness.     All other systems reviewed and are negative.  EKGs/Labs/Other Studies Reviewed:    The following studies were reviewed today:  Cath: 08/20/21   Ost LAD to Prox LAD lesion is 100% stenosed.   Dist Cx lesion is 99% stenosed.   Previously placed Prox RCA to Mid RCA stent (unknown type) is  widely patent.   Previously placed Mid Cx to Dist Cx stent (unknown type) is  widely patent.   A drug-eluting stent was successfully placed.   Post intervention, there is a 0% residual stenosis.   Post intervention, there is a 0% residual stenosis.   IMPRESSION: Successful PCI drug-eluting stenting of a distal circumflex obtuse marginal branch stenosis involving the distal edge of the previously placed stent with placement of a 2.5 x 15 mm long  Medtronic frontier drug-eluting stent postdilated 2.8 mm.  There was an abnormality in the mid RCA within the stented segment that did not appear significantly different from the angiogram 4 years ago and therefore this was not addressed.  The sheath will be removed once the ACT falls below 170 and pressure held.  Patient be hydrated overnight.  He will need 12 months of uninterrupted DAPT.  He left the lab in stable condition.   Quay Burow. MD, [facility] 08/20/2021 2:20 PM   Diagnostic Dominance: Right Intervention      EKG:  EKG is  ordered today.  The ekg ordered today demonstrates sinus rhythm 75, first degree heart block, inferior Q waves  Recent Labs: 08/19/2021: B Natriuretic Peptide 119.6 08/26/2021: ALT 19; BUN 34; Creatinine, Ser 2.09; Hemoglobin 12.0; Platelets 224; Potassium 4.6; Sodium 137  Recent Lipid Panel    Component Value Date/Time   CHOL 152 08/20/2021 0427   CHOL 197 10/22/2020 1100   TRIG 83 08/20/2021 0427   HDL 43 08/20/2021 0427   HDL 55 10/22/2020 1100   CHOLHDL 3.5 08/20/2021 0427   VLDL 17 08/20/2021 0427   LDLCALC 92 08/20/2021 0427   LDLCALC 124 (H)  10/22/2020 1100    Physical Exam:    VS:  BP 126/70 (BP Location: Left Arm, Patient Position: Sitting, Cuff Size: Normal)    Pulse 75    Ht _0  (1.753 m)    Wt 199 lb (90.3 kg)    SpO2 96%    BMI 29.39 kg/m     Wt Readings from Last 3 Encounters:  09/16/21 199 lb (90.3 kg)  08/26/21 199 lb 3.2 oz (90.4 kg)  08/19/21 215 lb (97.5 kg)     GEN:  Well nourished, well developed in no acute distress HEENT: Normal NECK: No JVD; No carotid bruits LYMPHATICS: No lymphadenopathy CARDIAC: RRR, no murmurs, rubs, gallops RESPIRATORY:  Clear to auscultation without rales, wheezing or rhonchi  ABDOMEN: Soft, non-tender, non-distended MUSCULOSKELETAL:  No edema; No deformity  SKIN: Warm and dry NEUROLOGIC:  Alert and oriented x 3 PSYCHIATRIC:  Normal affect   ASSESSMENT:    1. History of ST elevation  myocardial infarction (STEMI) of inferolateral wall (PTCA - 100% large lateral OM)   2. CAD S/P CABG '95- several PCis since, last 05/17/14   3. Hyperlipidemia LDL goal <70; statin intolerant   4. Hypertensive heart disease with chronic diastolic congestive heart failure ([place])   5. Acute renal failure superimposed on stage 3 chronic kidney disease, unspecified acute renal failure type, unspecified whether stage 3a or 3b CKD ([place])   6. Abdominal aortic aneurysm (AAA) without rupture, unspecified part    PLAN:    In order of problems listed above:  CAD with history of CABG 1997 DES to distal Lcx Doing well on aspirin and Brilinta No beta-blocker with history of bradycardia Continue ranolazine and amlodipine   Hyperlipidemia LDL 92 On Zetia and fenofibrate Statin intolerance, not interested in PCSK9 inhibitor. Apparently he did try the repatha and did not tolerate it - had a reaction they can't recall.     Hypertension Continue amlodipine, valsartan-HCTZ   AoCKD sCr 2.09 on 08/26/21 with PCP. PCP had added HCTZ to valsartan. He was instructed to repeat labs in 2 weeks, but they state they were unaware of these instructions. I have sent them back to PCP to have labs drawn - consider adding TSH given chronic fatigue.    AAA with possible endoleak and aneurysmal sac [facility]VS -does not appear he has an appointment yet - Will refer him back to Dr. Donzetta Matters   Follow up in 4-5 months.    Medication Adjustments/Labs and Tests Ordered: Current medicines are reviewed at length with the patient today.  Concerns regarding medicines are outlined above.  Orders Placed This Encounter  Procedures   EKG 12-Lead    No orders of the defined types were placed in this encounter.   Signed, Ledora Bottcher, PA  09/16/2021 3:59 PM    [facility]

## 2021-09-16 ENCOUNTER — Encounter: Payer: Self-pay | Admitting: Physician Assistant

## 2021-09-16 ENCOUNTER — Other Ambulatory Visit: Payer: Self-pay

## 2021-09-16 ENCOUNTER — Ambulatory Visit: Payer: Medicare Other | Admitting: Physician Assistant

## 2021-09-16 VITALS — BP 126/70 | HR 75 | Ht 69.0 in | Wt 199.0 lb

## 2021-09-16 DIAGNOSIS — I2119 ST elevation (STEMI) myocardial infarction involving other coronary artery of inferior wall: Secondary | ICD-10-CM

## 2021-09-16 DIAGNOSIS — I110 Hypertensive heart disease with heart failure: Secondary | ICD-10-CM

## 2021-09-16 DIAGNOSIS — N183 Chronic kidney disease, stage 3 unspecified: Secondary | ICD-10-CM

## 2021-09-16 DIAGNOSIS — I25119 Atherosclerotic heart disease of native coronary artery with unspecified angina pectoris: Secondary | ICD-10-CM

## 2021-09-16 DIAGNOSIS — E785 Hyperlipidemia, unspecified: Secondary | ICD-10-CM | POA: Diagnosis not present

## 2021-09-16 DIAGNOSIS — N179 Acute kidney failure, unspecified: Secondary | ICD-10-CM

## 2021-09-16 DIAGNOSIS — I5032 Chronic diastolic (congestive) heart failure: Secondary | ICD-10-CM

## 2021-09-16 DIAGNOSIS — I714 Abdominal aortic aneurysm, without rupture, unspecified: Secondary | ICD-10-CM

## 2021-09-16 NOTE — Patient Instructions (Signed)
Medication Instructions:  Your physician recommends that you continue on your current medications as directed. Please refer to the Current Medication list given to you today.  *If you need a refill on your cardiac medications before your next appointment, please call your pharmacy*  Lab Work: NONE ordered at this time of appointment   If you have labs (blood work) drawn today and your tests are completely normal, you will receive your results only by: [place] (if you have MyChart) OR A paper copy in the mail If you have any lab test that is abnormal or we need to change your treatment, we will call you to review the results.  Testing/Procedures: NONE ordered at this time of appointment   Follow-Up: At [facility], you and your health needs are our priority.  As part of our continuing mission to provide you with exceptional heart care, we have created designated Provider Care Teams.  These Care Teams include your primary Cardiologist (physician) and Advanced Practice Providers (APPs -  Physician Assistants and Nurse Practitioners) who all work together to provide you with the care you need, when you need it.  We recommend signing up for the patient portal called "MyChart".  Sign up information is provided on this After Visit Summary.  MyChart is used to connect with patients for Virtual Visits (Telemedicine).  Patients are able to view lab/test results, encounter notes, upcoming appointments, etc.  Non-urgent messages can be sent to your provider as well.   To learn more about what you can do with MyChart, go to [email or web address].    Your next appointment:   4-5 month(s)  The format for your next appointment:   In Person  Provider:   Glenetta Hew, MD  or Fabian Sharp, PA-C   Other Instructions You have been referred to Dr. Servando Snare at [facility]  When you go to your PCP ask if he/she will do a BMP and TSH labs and forward to our office

## 2021-10-07 ENCOUNTER — Encounter: Payer: Medicare Other | Admitting: Vascular Surgery

## 2021-10-12 ENCOUNTER — Other Ambulatory Visit: Payer: Self-pay | Admitting: Cardiology

## 2021-10-21 ENCOUNTER — Other Ambulatory Visit: Payer: Self-pay

## 2021-10-21 ENCOUNTER — Telehealth: Payer: Self-pay | Admitting: Cardiology

## 2021-10-21 ENCOUNTER — Encounter: Payer: Self-pay | Admitting: Vascular Surgery

## 2021-10-21 ENCOUNTER — Ambulatory Visit: Payer: Medicare Other | Admitting: Vascular Surgery

## 2021-10-21 VITALS — BP 139/77 | HR 71 | Temp 98.1°F | Resp 20 | Ht 69.0 in | Wt 202.0 lb

## 2021-10-21 DIAGNOSIS — I7143 Infrarenal abdominal aortic aneurysm, without rupture: Secondary | ICD-10-CM

## 2021-10-21 DIAGNOSIS — I9789 Other postprocedural complications and disorders of the circulatory system, not elsewhere classified: Secondary | ICD-10-CM | POA: Diagnosis not present

## 2021-10-21 MED ORDER — TICAGRELOR 90 MG PO TABS: 90.0000 mg | ORAL_TABLET | Freq: Two times a day (BID) | ORAL | 1 refills | Status: DC

## 2021-10-21 NOTE — Progress Notes (Signed)
Patient ID: [name], male   DOB: 01/17/1936, 86 y.o.   MRN: [id number]  Reason for Consult: New Patient (Initial Visit)   Referred by Denita Lung, MD  Subjective:     HPI:  [name] is a 86 y.o. male with history of endovascular aneurysm repair.  He has had subsequent endoleak repair on 2 separate occasions 1 by me which was an IMA coiling in 1 by Dr. Barbie Banner with interventional radiology.  He is now followed by Dr. Earleen Newport with radiology he saw him as recently as September.  He has no new back or abdominal pain.  Overall he is doing well without any new vascular issues.  Past Medical History:  Diagnosis Date   Adenomatous colon polyp    Ankle edema    Chronic   Asthma    CAD S/P percutaneous coronary angioplasty 2004   a) '04: Staged Taxus DES PCI to RCA (2 Taxus 2.75 x 32 & 12) and Cx-OM (Taxus 3 x 20);;'07 - PCI to pRCA ISR- Cypher DES; c) 05/2014: pPCI pRCA stentISR (Promus DES 3 x 8) OM1 distal to stent (Promus DES 2.75 x 12 - 3.1)   CAD, multiple vessel 1985   Most recent July 2018: Admitted for non-STEMI. Occluded LAD with patent LIMA (SP1 now occluded).  Patent stents in proximal and mid RCA as well as circumflex-OM 3. Known occlusion of SVG-OM. EF 45-50%   CHF (congestive heart failure) ([place])    Cholelithiasis - with cholangitis & choledocholithiasis    status post ERCP with removal of calculi and biliary stent placement.   Chronic anemia    On iron supplement; history of positive guaiac - negative colonoscopy in 1996.; Thought to be related to hemorrhoids; status post hemorrhoidectomy   Chronic back pain     multiple surgeries; C-spine and lumbar   Diabetes mellitus    on oral medication   Diverticulitis of colon 1996   Diverticulosis    Dyslipidemia, goal LDL below 70    Erectile dysfunction    Exertional dyspnea    Chronic baseline SOB with ambulation   GERD (gastroesophageal reflux disease)    Grade II diastolic dysfunction    H/O: pneumonia 11/2012    Hemorrhoids    Hiatal hernia    History of: ST elevation myocardial infarction (STEMI) involving left circumflex coronary artery with complication 7494   PTCA-circumflex; PCI in 1991   Hypertension    Moderate aortic stenosis by prior echocardiogram 07/2015   Normal LV function - EF 55-60%.. Abnormal relaxation. Mild-moderate aortic stenosis (peak/mean gradient 18/10 mmH)   Osteoarthritis of both knees    And back; multiple back surgeries, right knee arthroplasty and left knee arthroscopic surgery x2   Pneumonia 2016   S/P AAA (abdominal aortic aneurysm) repair 08/30/2012   s/p EVAR   S/P CABG x 2 1997   LIMA-LAD, SVG-OM   Sleep apnea    pt. states he was told to return for f/u, to be fitted for Cpap, but pt. reports that he didn't follow up-no cpap used   Statin myopathy 09/30/2015   Family History  Problem Relation Age of Onset   Arthritis Mother    Diabetes Father    Heart disease Father    Stroke Sister    Hypertension Sister    Heart disease Sister    Diabetes Sister    Breast cancer Sister    Heart disease Brother    Ulcers Brother    Colon cancer Neg  Hx    Past Surgical History:  Procedure Laterality Date   Abdominal and Lower Extremity Arterial Ultrasound  08/23/2012; 10/10/2013   Normal ABIs. Nonocclusive lower extremity disease. 4.2 cm x 4.3 cm infrarenal AAA;; 4.4 cm x 4.3 cm (essentially stable)    ABDOMINAL AORTIC ENDOVASCULAR STENT GRAFT N/A 08/13/2015   Procedure: ABDOMINAL AORTIC ENDOVASCULAR STENT GRAFT;  Surgeon: Mal Misty, MD;  Location: [facility];  Service: Vascular;  Laterality: N/A;   Anterior cervical plating  04/23/2010   At C4-5 and a C6-7 utilizing two separate Biomet MaxAn plates.   ANTERIOR LAT LUMBAR FUSION Left 11/22/2012   Procedure: ANTERIOR LATERAL LUMBAR FUSION 1 LEVEL;  Surgeon: Eustace Moore, MD;  Location: [place] [facility];  Service: Neurosurgery;  Laterality: Left;  Anterior Lateral Lumbar Fusion Lumbar Three-Four   BACK SURGERY  1979 &  x 10   pt. remarks, "I have had about 10 back surgeries"   BILIARY STENT PLACEMENT N/A 07/03/2014   Procedure: BILIARY STENT PLACEMENT;  Surgeon: Inda Castle, MD;  Location: [place];  Service: Endoscopy;  Laterality: N/A;   CARDIAC CATHETERIZATION  06/2003   None Occluded vein graft to OM; diffuse RCA disease in the mid vessel, 80% circumflex-OM stenosis; follow on AV groove circumflex with sequential 90% stenoses and intervening saccular dilation    CARDIAC CATHETERIZATION  12/2008   4 abnormal Myoview showing apical thinning (possibly due to apical LAD 95%) : 100% Occluded LAD after SV1, distal LAD grafted via LIMA -apical 95% . Cx -OM1 w/patent stent extending into OM 1 . Follow on Cx - 70-80% - non-amenable PCI. RCA widely patent 3 overlapping stents in mRCA w/less than 40% stenosisin RPL; SVG-OM known occluded    CARPAL TUNNEL RELEASE Left 11/16/2019   Procedure: LEFT CARPAL TUNNEL RELEASE;  Surgeon: Mcarthur Rossetti, MD;  Location: [facility];  Service: Orthopedics;  Laterality: Left;   CATARACT EXTRACTION     Cervical arthrodesis  04/23/2010   Anterior cervical arthrodesis, C4-5, C6-7 utilizing 7-mm PEEK interbody cage packed with local autograft & Antifuse putty at C4-5 & an 8-mm cage at C6-7.   CERVICAL DISCECTOMY  04/23/2010   Decompressive anterior carvical diskectomy. C4-5, C6-7   CHOLECYSTECTOMY N/A 05/23/2015   Procedure: LAPAROSCOPIC CHOLECYSTECTOMY WITH INTRAOPERATIVE CHOLANGIOGRAM;  Surgeon: Alphonsa Overall, MD;  Location: [facility];  Service: General;  Laterality: N/A;   [place]  1985,1991,15   1985 lateral STEMI Circumflex PTCA;    CORONARY ARTERY BYPASS GRAFT  1997   LIMA-LAD, SVG-OM (SVG known to be occluded prior to 2004)   CORONARY STENT INTERVENTION N/A 08/20/2021   Procedure: CORONARY STENT INTERVENTION;  Surgeon: Lorretta Harp, MD;  Location: [place] [facility];  Service: Cardiovascular;  Laterality: N/A;   ERCP N/A  07/03/2014   Procedure: ENDOSCOPIC RETROGRADE CHOLANGIOPANCREATOGRAPHY (ERCP);  Surgeon: Inda Castle, MD;  Location: [place];  Service: Endoscopy;  Laterality: N/A;   ERCP N/A 07/05/2014   Procedure: ENDOSCOPIC RETROGRADE CHOLANGIOPANCREATOGRAPHY (ERCP);  Surgeon: Inda Castle, MD;  Location: [place];  Service: Endoscopy;  Laterality: N/A;   ERCP N/A 06/30/2015   Procedure: ENDOSCOPIC RETROGRADE CHOLANGIOPANCREATOGRAPHY (ERCP);  Surgeon: Ladene Artist, MD;  Location: [facility];  Service: Endoscopy;  Laterality: N/A;   EYE SURGERY     IR ANGIOGRAM EXTREMITY LEFT  01/18/2017   IR ANGIOGRAM PELVIS SELECTIVE OR SUPRASELECTIVE  01/18/2017   IR ANGIOGRAM SELECTIVE EACH ADDITIONAL VESSEL  01/18/2017   IR AORTAGRAM ABDOMINAL  SERIALOGRAM  01/18/2017   IR EMBO ARTERIAL NOT HEMORR HEMANG INC GUIDE ROADMAPPING  01/18/2017   IR RADIOLOGIST EVAL & MGMT  01/04/2017   IR RADIOLOGIST EVAL & MGMT  10/26/2017   IR RADIOLOGIST EVAL & MGMT  05/24/2018   IR RADIOLOGIST EVAL & MGMT  06/19/2019   IR RADIOLOGIST EVAL & MGMT  06/11/2020   IR RADIOLOGIST EVAL & MGMT  07/02/2021   IR US GUIDE VASC ACCESS RIGHT  01/18/2017   KNEE ARTHROSCOPY Left    x 2   LEFT HEART CATH AND CORS/GRAFTS ANGIOGRAPHY N/A 04/20/2017   Procedure: Left Heart Cath and Cors/Grafts Angiography;  Surgeon: Lorretta Harp, MD;  Location: [place] [facility];  LAD now occluded prior to SP1.LIMA-LAD. Patent RCA and circumflex stents. EF 45-50%. Relatively stable.    LEFT HEART CATH AND CORS/GRAFTS ANGIOGRAPHY N/A 08/20/2021   Procedure: LEFT HEART CATH AND CORS/GRAFTS ANGIOGRAPHY;  Surgeon: Lorretta Harp, MD;  Location: [place] [facility];  Service: Cardiovascular;  Laterality: N/A;   LEFT HEART CATHETERIZATION WITH CORONARY ANGIOGRAM N/A 05/15/2014   Procedure: LEFT HEART CATHETERIZATION WITH CORONARY ANGIOGRAM;  Surgeon: Sinclair Grooms, MD;  Location: [facility] CATH LAB;  Service: Cardiovascular;  Laterality: N/A;   LEFT  HEART CATHETERIZATION WITH CORONARY/GRAFT ANGIOGRAM N/A 06/28/2014   Procedure: LEFT HEART CATHETERIZATION WITH Beatrix Fetters;  Surgeon: Troy Sine, MD;  Location: [facility] CATH LAB;  Service: Cardiovascular;  Laterality: N/A;   LUMBAR PERCUTANEOUS PEDICLE SCREW 1 LEVEL N/A 11/22/2012   Procedure: LUMBAR PERCUTANEOUS PEDICLE SCREW 1 LEVEL;  Surgeon: Eustace Moore, MD;  Location: [place] [facility];  Service: Neurosurgery;  Laterality: N/A;  Lumbar Three-Four Percutaneous Pedicle Screw, Lateral approach   MINOR HEMORRHOIDECTOMY     NM MYOVIEW LTD  05/26/2021   Lexiscan: Intermediate risk (similar findings to last study), with exception of reduced EF roughly 40%..  Prior basal inferior-inferolateral infarct without peri-infarct ischemia.   ->  Echo ordered -> (EF 45 to 50%)-when compared to prior Myoview, infarct appears similar.   PERCUTANEOUS CORONARY STENT INTERVENTION (PCI-S)  06/2003   PCI - RCA 2 overlapping Taxus DES 2.75 mm x 32 mm and 2.75 mm x 12 mm (3.0 mm); PCI-Cx-OM1 - Taxus DES 3.0 mm x 20 mm (3.1 mm);    PERCUTANEOUS CORONARY STENT INTERVENTION (PCI-S)  12/2005   80% ISR in proximal Taxus stent in RCA -- covered proximally with Cypher DES 3.0 mm x 12 mm   PERCUTANEOUS CORONARY STENT INTERVENTION (PCI-S) N/A 05/17/2014   Procedure: PERCUTANEOUS CORONARY STENT INTERVENTION (PCI-S);  Surgeon: Sinclair Grooms, MD;  Location: [facility] CATH LAB: PCI pRCA stent ISR - Promus DES 3.0 x 8 (3.25), OM distal stent edge - Promus DES 2.75 x 12 (3.1)   PERIPHERAL VASCULAR CATHETERIZATION  11/03/2016   Procedure: Embolization;  Surgeon: Waynetta Sandy, MD;  Location: [place] [facility] LAB;  Service: Cardiovascular;;   POSTERIOR CERVICAL FUSION/FORAMINOTOMY N/A 05/02/2013   Procedure: POSTERIOR CERVICAL FUSION/FORAMINOTOMY CERVICAL SEVEN THORACIC-ONE;  Surgeon: Eustace Moore, MD;  Location: [place] [facility];  Service: Neurosurgery;  Laterality: N/A;  POSTERIOR CERVICAL FUSION/FORAMINOTOMY CERVICAL  SEVEN THORACIC-ONE   SPHINCTEROTOMY N/A 06/30/2015   Procedure: SPHINCTEROTOMY;  Surgeon: Ladene Artist, MD;  Location: [facility];  Service: Endoscopy;  Laterality: N/A;   TOTAL KNEE ARTHROPLASTY Right    TOTAL KNEE ARTHROPLASTY Left 12/20/2016   Procedure: LEFT TOTAL KNEE ARTHROPLASTY;  Surgeon: Vickey Huger, MD;  Location: [place];  Service: Orthopedics;  Laterality:  Left;   TRANSTHORACIC ECHOCARDIOGRAM  06/10/2021   EF 45 to 50%.  Mildly reduced function.  Mild hypokinesis of basal-mid inferior and inferolateral wall consistent with prior infarct.  Severe LA dilation.  Mild to moderate MR with moderate MAC.  Mild calcific AS (mean gradient 11 mmHg) --> LV function seems unchanged.  Diastolic function improved.  Wall motion abnormality unchanged.  AS unchanged.   TRANSTHORACIC ECHOCARDIOGRAM  07/2018   Normal LV size.  EF 50 to 55% with mild HK of inferolateral wall.  GRII DD.  Mild AS with mean gradient 15 mm or greater.  Mild ascending aortic dilation.  Mildly increased PA pressures of 41 mmHg   UPPER GI ENDOSCOPY  07/03/2019   VISCERAL ANGIOGRAM  11/03/2016   Procedure: Visceral Angiogram;  Surgeon: Waynetta Sandy, MD;  Location: [place] [facility] LAB;  Service: Cardiovascular;;    Short Social History:  Social History   Tobacco Use   Smoking status: Former    Packs/day: 3.00    Years: 45.00    Pack years: 135.00    Types: Cigarettes    Quit date: 10/05/1983    Years since quitting: 38.0   Smokeless tobacco: Never  Substance Use Topics   Alcohol use: Yes    Alcohol/week: 4.0 standard drinks    Types: 4 Glasses of wine per week    Comment: 2 or 3 beers per day and more on the weekend    Allergies  Allergen Reactions   Oxycodone Shortness Of Breath and Cough   Lisinopril Cough   Statins Other (See Comments)    MYALGIAS    Welchol [Colesevelam Hcl] Itching    Current Outpatient Medications  Medication Sig Dispense Refill   acetaminophen (TYLENOL) 500 MG tablet  Take 1,000 mg by mouth every 6 (six) hours as needed for mild pain, moderate pain or headache. Reported on 03/16/2016     amLODipine (NORVASC) 5 MG tablet Take 1 tablet by mouth once daily 90 tablet 3   aspirin 81 MG chewable tablet Chew 1 tablet (81 mg total) by mouth daily. 30 tablet 1   Choline Fenofibrate (FENOFIBRIC ACID) 135 MG CPDR TAKE ONE CAPSULE BY MOUTH EVERY DAY 90 capsule 1   ezetimibe (ZETIA) 10 MG tablet Take 1 tablet by mouth once daily 90 tablet 3   Ferrous Sulfate (IRON PO) Take 65 mg by mouth.     Multiple Vitamins-Minerals (MULTIVITAMIN WITH MINERALS) tablet Take 1 tablet by mouth daily.     pantoprazole (PROTONIX) 40 MG tablet Take 1 tablet by mouth once daily 90 tablet 3   ranolazine (RANEXA) 500 MG 12 hr tablet Take 1 tablet by mouth twice daily 90 tablet 3   sildenafil (VIAGRA) 100 MG tablet TAKE ONE TABLET BY MOUTH DAILY AS NEEDED FOR ERECTILE DYSFUNCTION 90 tablet 1   ticagrelor (BRILINTA) 90 MG TABS tablet Take 1 tablet (90 mg total) by mouth 2 (two) times daily. 60 tablet 1   valsartan-hydrochlorothiazide (DIOVAN HCT) 320-12.5 MG tablet Take 1 tablet by mouth daily. 90 tablet 3   No current facility-administered medications for this visit.    Review of Systems  Constitutional:  Constitutional negative. HENT: HENT negative.  Eyes: Eyes negative.  Respiratory: Respiratory negative.  Cardiovascular: Cardiovascular negative.  GI: Gastrointestinal negative.  Musculoskeletal: Musculoskeletal negative.  Skin: Skin negative.  Neurological: Neurological negative. Hematologic: Hematologic/lymphatic negative.  Psychiatric: Psychiatric negative.       Objective:  Objective   Vitals:   10/21/21 1142  BP: 139/77  Pulse: 71  Resp: 20  Temp: 98.1 F (36.7 C)  SpO2: 98%  Weight: 202 lb (91.6 kg)  Height: _0  (1.753 m)   Body mass index is 29.83 kg/m.  Physical Exam HENT:     Head: Normocephalic.     Nose:     Comments: Wearing a mask Eyes:     Pupils:  Pupils are equal, round, and reactive to light.  Cardiovascular:     Rate and Rhythm: Normal rate.     Pulses: Normal pulses.  Pulmonary:     Effort: Pulmonary effort is normal.  Abdominal:     General: Abdomen is flat.     Palpations: Abdomen is soft. There is no mass.  Musculoskeletal:        General: Normal range of motion.     Cervical back: Normal range of motion and neck supple.     Right lower leg: No edema.     Left lower leg: No edema.  Skin:    General: Skin is warm and dry.     Capillary Refill: Capillary refill takes less than 2 seconds.  Neurological:     General: No focal deficit present.     Mental Status: He is alert.  Psychiatric:        Mood and Affect: Mood normal.        Behavior: Behavior normal.        Thought Content: Thought content normal.        Judgment: Judgment normal.    Data: CT IMPRESSION: 1. Redemonstrated findings of aortobiiliac stent endograft repair of an infrarenal abdominal aortic aneurysm, with additional coiling of the inferior mesenteric artery and right L4 lumbar arteries. The excluded aneurysm sac is minimally enlarged compared to prior examination, measuring 6.6 x 5.5 cm, previously 6.3 x 5.2 cm when measured similarly. Findings are concerning for a persistent endoleak resulting in aneurysm sac enlargement given evidence on prior contrast enhanced angiogram.     Assessment/Plan:     86 year old male with history of endovascular aneurysm repair with known endoleak and expanding aneurysm.  At his last visit with interventional radiology was not interested in any procedures and the plan was to follow-up in 1 year.  Given that radiology is following closely patient can follow-up here as needed we are always happy to see the patient or discussed with radiology if needed.     Waynetta Sandy MD [facility]

## 2021-10-21 NOTE — Telephone Encounter (Signed)
Refill sent to the pharmacy 

## 2021-10-21 NOTE — Telephone Encounter (Signed)
°*  STAT* If patient is at the pharmacy, call can be transferred to refill team.   1. Which medications need to be refilled? (please list name of each medication and dose if known)  ticagrelor (BRILINTA) 90 MG TABS tablet  2. Which pharmacy/location (including street and city if local pharmacy) is medication to be sent to? [facility] [place] ([place]), [place] - [address]  3. Do they need a 30 day or 90 day supply?  30 day supply   Patient only has two tablets left.

## 2021-10-27 ENCOUNTER — Ambulatory Visit (INDEPENDENT_AMBULATORY_CARE_PROVIDER_SITE_OTHER): Payer: Medicare Other | Admitting: Family Medicine

## 2021-10-27 ENCOUNTER — Encounter: Payer: Self-pay | Admitting: Family Medicine

## 2021-10-27 ENCOUNTER — Telehealth: Payer: Self-pay | Admitting: Family Medicine

## 2021-10-27 ENCOUNTER — Other Ambulatory Visit: Payer: Self-pay

## 2021-10-27 VITALS — BP 136/80 | HR 71 | Temp 98.1°F | Ht 69.0 in | Wt 199.8 lb

## 2021-10-27 DIAGNOSIS — Z860100 Personal history of colon polyps, unspecified: Secondary | ICD-10-CM

## 2021-10-27 DIAGNOSIS — I25119 Atherosclerotic heart disease of native coronary artery with unspecified angina pectoris: Secondary | ICD-10-CM | POA: Diagnosis not present

## 2021-10-27 DIAGNOSIS — D692 Other nonthrombocytopenic purpura: Secondary | ICD-10-CM

## 2021-10-27 DIAGNOSIS — T466X5A Adverse effect of antihyperlipidemic and antiarteriosclerotic drugs, initial encounter: Secondary | ICD-10-CM

## 2021-10-27 DIAGNOSIS — E669 Obesity, unspecified: Secondary | ICD-10-CM | POA: Diagnosis not present

## 2021-10-27 DIAGNOSIS — I5032 Chronic diastolic (congestive) heart failure: Secondary | ICD-10-CM

## 2021-10-27 DIAGNOSIS — J309 Allergic rhinitis, unspecified: Secondary | ICD-10-CM

## 2021-10-27 DIAGNOSIS — G720 Drug-induced myopathy: Secondary | ICD-10-CM

## 2021-10-27 DIAGNOSIS — K219 Gastro-esophageal reflux disease without esophagitis: Secondary | ICD-10-CM

## 2021-10-27 DIAGNOSIS — Z0000 Encounter for general adult medical examination without abnormal findings: Secondary | ICD-10-CM

## 2021-10-27 DIAGNOSIS — Z96653 Presence of artificial knee joint, bilateral: Secondary | ICD-10-CM

## 2021-10-27 DIAGNOSIS — N529 Male erectile dysfunction, unspecified: Secondary | ICD-10-CM

## 2021-10-27 DIAGNOSIS — Z79899 Other long term (current) drug therapy: Secondary | ICD-10-CM

## 2021-10-27 DIAGNOSIS — Z23 Encounter for immunization: Secondary | ICD-10-CM

## 2021-10-27 DIAGNOSIS — E785 Hyperlipidemia, unspecified: Secondary | ICD-10-CM | POA: Diagnosis not present

## 2021-10-27 DIAGNOSIS — E119 Type 2 diabetes mellitus without complications: Secondary | ICD-10-CM

## 2021-10-27 DIAGNOSIS — I700 Atherosclerosis of aorta: Secondary | ICD-10-CM

## 2021-10-27 DIAGNOSIS — I110 Hypertensive heart disease with heart failure: Secondary | ICD-10-CM

## 2021-10-27 DIAGNOSIS — N1832 Chronic kidney disease, stage 3b: Secondary | ICD-10-CM

## 2021-10-27 MED ORDER — FENOFIBRIC ACID 135 MG PO CPDR: 1.0000 | DELAYED_RELEASE_CAPSULE | Freq: Every day | ORAL | 3 refills | Status: DC

## 2021-10-27 MED ORDER — VALSARTAN-HYDROCHLOROTHIAZIDE 320-12.5 MG PO TABS: 1.0000 | ORAL_TABLET | Freq: Every day | ORAL | 3 refills | Status: DC

## 2021-10-27 MED ORDER — AMLODIPINE BESYLATE 5 MG PO TABS: 5.0000 mg | ORAL_TABLET | Freq: Every day | ORAL | 3 refills | Status: DC

## 2021-10-27 MED ORDER — SILDENAFIL CITRATE 100 MG PO TABS: ORAL_TABLET | ORAL | 1 refills | Status: DC

## 2021-10-27 NOTE — Patient Instructions (Signed)
°  [name] , Thank you for taking time to come for your Medicare Wellness Visit. I appreciate your ongoing commitment to your health goals. Please review the following plan we discussed and let me know if I can assist you in the future.   These are the goals we discussed: Continue on present medications but try to cut back on pantoprazole to every other day.  Also discussed the possibility of start not doing a colonoscopy based on his age.  I will defer that to [name] Plan.   This is a list of the screening recommended for you and due dates:  Health Maintenance  Topic Date Due   Colon Cancer Screening  09/21/2017   Complete foot exam   07/20/2018   Eye exam for diabetics  06/05/2021   Hemoglobin A1C  02/23/2022   Tetanus Vaccine  08/20/2031   Pneumonia Vaccine  Completed   Flu Shot  Completed   Zoster (Shingles) Vaccine  Completed   HPV Vaccine  Aged Out   COVID-19 Vaccine  Discontinued

## 2021-10-27 NOTE — Telephone Encounter (Signed)
Pt called and stated rx was going to be $120 He said he was to call and let you know and you were going to send in rx somewhere else Please let patient know

## 2021-10-27 NOTE — Progress Notes (Signed)
[name] is a 86 y.o. male who presents for annual wellness visit and follow-up on chronic medical conditions.  He continues to follow-up with cardiology Heart disease.  For his under presently he does complain of some shortness of breath.  He is scheduled to see them within the next month or so.  He continues on valsartan/HCTZ as well as Ranexa and Brilinta.  He is taking Zetia.  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  He would like a refill on his sildenafil.  He has had knee TKR and has had some knee pain that seem to be controlled with Tylenol.  His allergies seem to be under good control.  He is taking pantoprazole for his reflux disease.  He has tried to stop this in the past and his symptoms have reoccurred.  He did fall twice last year and states he lost his balance but has done well after that.  His weight is stable.  Blood work does show evidence of renal insufficiency.  He does have a previous history of diabetes but has had hemoglobin A1c is in the normal range for several years.  He also has a previous history of colonic polyp in the 2013 but did not follow-up in 2018.  Immunizations and Health Maintenance Immunization History  Administered Date(s) Administered   DTaP 02/19/2000   Fluad Quad(high Dose 65+) 06/12/2020   Influenza Split 08/03/2002, 09/22/2006, 07/09/2008, 07/26/2012   Influenza Whole 07/15/2009   Influenza, High Dose Seasonal PF 08/17/2013, 05/29/2015, 10/19/2016, 07/20/2017, 07/20/2018, 06/19/2019, 06/05/2021   PFIZER Comirnaty(Gray Top)Covid-19 Tri-Sucrose Vaccine 09/08/2020   PFIZER(Purple Top)SARS-COV-2 Vaccination 11/09/2019, 12/04/2019   PNEUMOCOCCAL CONJUGATE-20 06/05/2021   Pneumococcal Conjugate-13 03/01/2017   Pneumococcal Polysaccharide-23 01/19/2007, 06/04/2014   Tdap 01/19/2007, 08/19/2021   Zoster Recombinat (Shingrix) 06/05/2021, 08/19/2021   Health Maintenance Due  Topic Date Due   COLONOSCOPY (Pts 45-65yrs Insurance coverage will need to be  confirmed)  09/21/2017   FOOT EXAM  07/20/2018   OPHTHALMOLOGY EXAM  06/05/2021    Last colonoscopy: 2013 Last PSA: not sure Dentist: yes doesn't remember name Ophtho: new eye doctor Exercise: no  Other doctors caring for patient include: Dr. Johny Chess cardiology                                                                  Dr Justin Mend Advanced Directives: Does Patient Have a Medical Advance Directive?: Yes Type of Advance Directive: Healthcare Power of Attorney, Living will Does patient want to make changes to medical advance directive?: Yes (ED - Information included in AVS) Copy of [name] in Chart?: No - copy requested  Depression screen:  See questionnaire below.     Depression screen [facility] 2/9 10/27/2021 10/22/2020 06/12/2020 07/20/2018 03/01/2017  Decreased Interest 0 0 0 0 0  Down, Depressed, Hopeless 0 0 0 0 0  PHQ - 2 Score 0 0 0 0 0  Some recent data might be hidden    Fall Screen: See Questionaire below.   Fall Risk  10/27/2021 10/22/2020 08/29/2019 07/20/2018 03/01/2017  Falls in the past year? 1 1 1  No No  Comment - - [facility] Telephone Survey: data to providers prior to load - -  Number falls in past yr: 1 1 1  - -  Comment - - [facility] Telephone Survey Actual Response = 1 - -  Injury with Fall? 0 0 0 - -  Risk for fall due to : History of fall(s) Impaired balance/gait - - -  Follow up Falls evaluation completed - - - -    ADL screen:  See questionnaire below.  Functional Status Survey: Is the patient deaf or have difficulty hearing?: Yes Does the patient have difficulty seeing, even when wearing glasses/contacts?: No Does the patient have difficulty concentrating, remembering, or making decisions?: Yes (sometimes) Does the patient have difficulty walking or climbing stairs?: Yes Does the patient have difficulty dressing or bathing?: No Does the patient have difficulty doing errands alone such as visiting a doctor's office or shopping?: No   Review  of Systems  Constitutional: -, -unexpected weight change, -anorexia, -fatigue Allergy: -sneezing, -itching, -congestion Dermatology: denies changing moles, rash, lumps ENT: -runny nose, -ear pain, -sore throat,  Cardiology:  -chest pain, -palpitations, -orthopnea, Respiratory: -cough, -shortness of breath, -dyspnea on exertion, -wheezing,  Gastroenterology: -abdominal pain, -nausea, -vomiting, -diarrhea, -constipation, -dysphagia Hematology: -bleeding or bruising problems Musculoskeletal: -arthralgias, -myalgias, -joint swelling, -back pain, - Ophthalmology: -vision changes,  Urology: -dysuria, -difficulty urinating,  -urinary frequency, -urgency, incontinence Neurology: -, -numbness, , -memory loss, -falls, -dizziness    PHYSICAL EXAM:  BP 136/80    Pulse 71    Temp 98.1 F (36.7 C)    Ht 5\' 9"  (1.753 m)    Wt 199 lb 12.8 oz (90.6 kg)    BMI 29.51 kg/m   General Appearance: Alert, cooperative, no distress, appears stated age Head: Normocephalic, without obvious abnormality, atraumatic Eyes: PERRL, conjunctiva/corneas clear, EOM's intact, fundi benign Ears: Normal TM's and external ear canals Nose: Nares normal, mucosa normal, no drainage or sinus   tenderness Throat: Lips, mucosa, and tongue normal; teeth and gums normal Neck: Supple, no lymphadenopathy, thyroid:no enlargement/tenderness/nodules; no carotid bruit or JVD Lungs: Clear to auscultation bilaterally without wheezes, rales or ronchi; respirations unlabored Heart: Regular rate and rhythm, S1 and S2 normal, no murmur, rub or gallop Abdomen: Soft, non-tender, nondistended, normoactive bowel sounds, no masses, no hepatosplenomegaly  Skin: Purpuric lesions noted on both forearms. Lymph nodes: Cervical, supraclavicular, and axillary nodes normal Neurologic: CNII-XII intact, normal strength, sensation and gait; reflexes 2+ and symmetric throughout   Psych: Normal mood, affect, hygiene and grooming  ASSESSMENT/PLAN: Routine  general medical examination at a health care facility  Hyperlipidemia LDL goal <70; statin intolerant  Type 2 diabetes mellitus in remission ([name])  CAD S/P CABG '95- several PCis since, last 05/17/14  Obesity (BMI 30-39.9)  Chronic renal impairment, stage 3b (HCC)  Allergic rhinitis, unspecified seasonality, unspecified trigger  Status post total bilateral knee replacement  Senile purpura ([place])  Hypertensive heart disease with chronic diastolic congestive heart failure (HCC)  Gastroesophageal reflux disease, unspecified whether esophagitis present  ED (erectile dysfunction) of organic origin  Atherosclerosis of aorta (HCC)  Statin myopathy  Erectile dysfunction, unspecified erectile dysfunction type  History of colonic polyps Continue on present medications but try to cut back on pantoprazole to every other day.  Also discussed the possibility of start not doing a colonoscopy based on his age.  I will defer that to Dr. Fuller Plan.     Immunization recommendations discussed.  Colonoscopy recommendations reviewed.   Medicare Attestation I have personally reviewed: The patient's medical and social history Their use of alcohol, tobacco or illicit drugs Their current medications and supplements The patient's functional ability including ADLs,fall risks, home safety risks,  cognitive, and hearing and visual impairment Diet and physical activities Evidence for depression or mood disorders  The patient's weight, height, and BMI have been recorded in the chart.  I have made referrals, counseling, and provided education to the patient based on review of the above and I have provided the patient with a written personalized care plan for preventive services.     Jill Alexanders, MD   10/27/2021

## 2021-10-28 NOTE — Telephone Encounter (Signed)
Pt. Aware called in to [facility] and to use goodrx card.

## 2021-11-29 ENCOUNTER — Telehealth: Payer: Self-pay | Admitting: Medical

## 2021-11-29 NOTE — Telephone Encounter (Signed)
On-call message, Sunday, November 29, 2021  Wife called in.  He developed a stomach virus 2 days ago.  Symptoms include possibly 10 loose stools a day for the last 2 days, few episodes of vomiting, some nausea,.  No fever no body aches or chills.  Wife is concerned about the volume of diarrhea.  She does not feel he is dehydrated though.  He is drinking plenty of fluids.  He is using Pepto-Bismol and Imodium but they are not helping all that much.  No blood in the stool.  No respiratory symptoms.  No sick contacts.  No recent travel.  I advised continue hydration, rest, can continue Pepto-Bismol and Imodium a few times daily.  If concerns for dehydration go to the emergency department.  Otherwise if not turning the corner and improving the next 24 hours then come in person for a visit tomorrow Monday.

## 2021-12-07 ENCOUNTER — Other Ambulatory Visit: Payer: Self-pay | Admitting: Family Medicine

## 2021-12-07 DIAGNOSIS — I110 Hypertensive heart disease with heart failure: Secondary | ICD-10-CM

## 2021-12-16 ENCOUNTER — Other Ambulatory Visit: Payer: Self-pay | Admitting: Cardiology

## 2021-12-16 DIAGNOSIS — E785 Hyperlipidemia, unspecified: Secondary | ICD-10-CM

## 2022-02-15 ENCOUNTER — Encounter: Payer: Self-pay | Admitting: Cardiology

## 2022-02-15 ENCOUNTER — Ambulatory Visit: Payer: Medicare Other | Admitting: Cardiology

## 2022-02-15 VITALS — BP 160/74 | HR 66 | Ht 69.0 in | Wt 204.8 lb

## 2022-02-15 DIAGNOSIS — E785 Hyperlipidemia, unspecified: Secondary | ICD-10-CM

## 2022-02-15 DIAGNOSIS — G720 Drug-induced myopathy: Secondary | ICD-10-CM

## 2022-02-15 DIAGNOSIS — N1832 Chronic kidney disease, stage 3b: Secondary | ICD-10-CM

## 2022-02-15 DIAGNOSIS — I350 Nonrheumatic aortic (valve) stenosis: Secondary | ICD-10-CM

## 2022-02-15 DIAGNOSIS — I5032 Chronic diastolic (congestive) heart failure: Secondary | ICD-10-CM

## 2022-02-15 DIAGNOSIS — D692 Other nonthrombocytopenic purpura: Secondary | ICD-10-CM

## 2022-02-15 DIAGNOSIS — T466X5A Adverse effect of antihyperlipidemic and antiarteriosclerotic drugs, initial encounter: Secondary | ICD-10-CM

## 2022-02-15 DIAGNOSIS — I2119 ST elevation (STEMI) myocardial infarction involving other coronary artery of inferior wall: Secondary | ICD-10-CM | POA: Diagnosis not present

## 2022-02-15 DIAGNOSIS — T466X5D Adverse effect of antihyperlipidemic and antiarteriosclerotic drugs, subsequent encounter: Secondary | ICD-10-CM

## 2022-02-15 DIAGNOSIS — I214 Non-ST elevation (NSTEMI) myocardial infarction: Secondary | ICD-10-CM | POA: Diagnosis not present

## 2022-02-15 DIAGNOSIS — R5383 Other fatigue: Secondary | ICD-10-CM

## 2022-02-15 DIAGNOSIS — R2689 Other abnormalities of gait and mobility: Secondary | ICD-10-CM

## 2022-02-15 DIAGNOSIS — Z951 Presence of aortocoronary bypass graft: Secondary | ICD-10-CM

## 2022-02-15 DIAGNOSIS — Z8679 Personal history of other diseases of the circulatory system: Secondary | ICD-10-CM

## 2022-02-15 DIAGNOSIS — Z9889 Other specified postprocedural states: Secondary | ICD-10-CM

## 2022-02-15 DIAGNOSIS — I25119 Atherosclerotic heart disease of native coronary artery with unspecified angina pectoris: Secondary | ICD-10-CM

## 2022-02-15 DIAGNOSIS — I110 Hypertensive heart disease with heart failure: Secondary | ICD-10-CM

## 2022-02-15 MED ORDER — CLOPIDOGREL BISULFATE 75 MG PO TABS: 75.0000 mg | ORAL_TABLET | Freq: Every day | ORAL | 3 refills | Status: DC

## 2022-02-15 MED ORDER — FUROSEMIDE 20 MG PO TABS: 20.0000 mg | ORAL_TABLET | Freq: Every day | ORAL | 6 refills | Status: DC | PRN

## 2022-02-15 NOTE — Patient Instructions (Signed)
Medication Instructions:  ? ?Stop taking  Brilinta ? ?Start taking Clopidogrel 75 mg  -  tomorrow 02/16/22-- the first day take 4 tablets , then take 75 mg daily  ? ? ?May take Furosemide 20 mg  as needed  ? ? ?*If you need a refill on your cardiac medications before your next appointment, please call your pharmacy* ? ? ?Lab Work:fasting ?CBC ?CMP ?LIPID  ?If you have labs (blood work) drawn today and your tests are completely normal, you will receive your results only by: ?MyChart Message (if you have MyChart) OR ?A paper copy in the mail ?If you have any lab test that is abnormal or we need to change your treatment, we will call you to review the results. ? ? ?Testing/Procedures: ?NOT NEEDED ? ? ?Follow-Up: ?At [facility], you and your health needs are our priority.  As part of our continuing mission to provide you with exceptional heart care, we have created designated Provider Care Teams.  These Care Teams include your primary Cardiologist (physician) and Advanced Practice Providers (APPs -  Physician Assistants and Nurse Practitioners) who all work together to provide you with the care you need, when you need it. ? ?We recommend signing up for the patient portal called "MyChart".  Sign up information is provided on this After Visit Summary.  MyChart is used to connect with patients for Virtual Visits (Telemedicine).  Patients are able to view lab/test results, encounter notes, upcoming appointments, etc.  Non-urgent messages can be sent to your provider as well.   ?To learn more about what you can do with MyChart, go to [email or web address].   ? ?Your next appointment:   ?1 month(s) ? ?The format for your next appointment:   ?In Person ? ?Provider:   ?Glenetta Hew, MD   ? ? ? ? ?Important Information About Sugar ? ? ? ? ?   ?

## 2022-02-15 NOTE — Progress Notes (Signed)
Primary Care Provider: Denita Lung, MD Cardiologist: Glenetta Hew, MD Electrophysiologist: None  Clinic Note: Chief Complaint  Patient presents with   Follow-up    22-month-> still notes some exertional dyspnea, edema.  Activity limited by back pain and poor balance/gait.   Coronary Artery Disease    No angina, but does have some exertional dyspnea.  Not the extent that he had in November.  Notes some difficulty with breathing.    ===================================  ASSESSMENT/PLAN   Problem List Items Addressed This Visit       Cardiology Problems   CAD S/P CABG '95- several PCis since, last 05/17/14 (Chronic)    Most recent cath was in November 2022-patent LIMA to LAD and stents in the RCA with LCx.  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 His symptoms are usually just fatigue.  As such, we have taken away beta-blocker because berry borderline bradycardia and fatigue he still complains of fatigue I think some of this is related to depression as well.  He was switched from clopidogrel to Brilinta with his last PCI, but I think he is having some of the dyspnea issues associated with Brilinta.  For now 6 months out, and both for the breathing issues and the financial issues, he would like to switch.  Plan: Convert from Brilinta to Plavix starting tomorrow: => Day 1 take 4 doses, and then 175 mg dose daily going forward. Continue aspirin 81 mg for DAPT He is now on amlodipine and Ranexa for antianginal benefit along with valsartan and HCTZ.  Echo reduction. I tried to talk to him again about adding Nexletol to the Zetia and fenofibrate, but he was reluctant to do so.  Plan was to recheck lipids today and discuss options and follow-up visit in 1 month.      Relevant Medications   furosemide (LASIX) 20 MG tablet   History of ST elevation myocardial infarction (STEMI) of inferolateral wall (PTCA - 100% large lateral OM) -  Primary (Chronic)    History of MI confirmed on Myoview he is also subsequently had non-STEMI as well.  Infarct noted with mildly reduced EF, but no active symptoms and no real significant systolic dysfunction issues.       Relevant Medications   furosemide (LASIX) 20 MG tablet   Other Relevant Orders   Lipid panel (Completed)   Comprehensive metabolic panel (Completed)   CBC (Completed)   Hypertensive heart disease with chronic diastolic congestive heart failure (HCC) (Chronic)    Multivessel disease, NYHA class II symptoms with some exertional dyspnea and fatigue.  Now started have some edema but no real PND orthopnea.  No beta-blocker because of fatigue.  He is on valsartan/HCTZ for afterload reduction along with amlodipine for additional blood pressure and antianginal control.  With him having some dyspnea on exertion and edema, will start low-dose Lasix 20 mg starting his PRN but recommend taking at least once twice a week.       Relevant Medications   furosemide (LASIX) 20 MG tablet   Hyperlipidemia LDL goal <70; statin intolerant (Chronic)    Unfortunately, he has not been tolerant of any medications other than Zetia and fenofibrate.  I stressed to him the importance of aggressively lowering levels of lipid.  In addition to diet, also change in activity.  Labs being checked today.  Not quite at goal.  Can discuss options in follow-up visit.  He has been reluctant to  try to start anything new..  Perhaps we could consider inclisiran is an option.       Relevant Medications   furosemide (LASIX) 20 MG tablet   Chronic diastolic CHF (congestive heart failure) (HCC) (Chronic)   Relevant Medications   furosemide (LASIX) 20 MG tablet   Other Relevant Orders   Lipid panel (Completed)   Comprehensive metabolic panel (Completed)   CBC (Completed)   Mild aortic stenosis by prior echocardiogram (Chronic)    Mild AS noted on echo in September 2022: Can probably wait until 2025 to  reassess unless the murmur gets worse.       Relevant Medications   furosemide (LASIX) 20 MG tablet   Other Relevant Orders   Comprehensive metabolic panel (Completed)   CBC (Completed)   NSTEMI (non-ST elevated myocardial infarction) (HCC) (Chronic)   Relevant Medications   furosemide (LASIX) 20 MG tablet   Other Relevant Orders   Lipid panel (Completed)   Comprehensive metabolic panel (Completed)   CBC (Completed)   Senile purpura (HCC) (Chronic)    Pretty significant bruising, worse with Brilinta.  Unfortunately he is still on DAPT.  Once he gets out into the November timeframe we can probably stop aspirin and continue lifelong Plavix.       Relevant Medications   furosemide (LASIX) 20 MG tablet     Other   S/P CABG x 2: 1997. SVG-OM (known to be occluded), LIMA-LAD (Chronic)    Patent LIMA-LAD with CTO of Native LAD -- Known CTO SVG-OM = distal LCx now with 2 overlapping DES.       Relevant Orders   Lipid panel (Completed)   Comprehensive metabolic panel (Completed)   CBC (Completed)   Fatigue   Relevant Orders   Lipid panel (Completed)   Comprehensive metabolic panel (Completed)   CBC (Completed)   Balance problems    Consider referral for vestibular therapy.       Relevant Orders   Comprehensive metabolic panel (Completed)   CBC (Completed)   Chronic renal insufficiency, stage III (moderate) (HCC) (Chronic)    Followed closely by PCP.  We we went ahead and recheck chemistry panel today since I was also checking his lipids = > creatinine pretty stable at 1.46.       Relevant Orders   Lipid panel (Completed)   Comprehensive metabolic panel (Completed)   CBC (Completed)   Statin myopathy (Chronic)    Statin intolerance.  Has tried multiple statins.  Not willing to try anymore.  Also not comfortable trying new medications at this point given his advanced age.       Relevant Orders   Lipid panel (Completed)   Comprehensive metabolic panel (Completed)    CBC (Completed)   Status post endovascular aneurysm repair (EVAR) (Chronic)   Relevant Orders   Lipid panel (Completed)   Comprehensive metabolic panel (Completed)   CBC (Completed)    ===================================  HPI:    [name] is a 86 y.o. male with a PMH below who presents today for 6 month f/u - still notes some fatigue & DOE, SOB.  He follows up at the request of Denita Lung, MD.  Long-standing history of CAD dating back to 1985: Inferior STEMI 1985: Eventually led to two-vessel CABG (LIMA-LAD, SVG-OM) in [place] showed occlusion of the SVG-OM.  --> PCI in 2004 and 2007, 2015 and then most recently 2018. '04: Staged PCI Taxus DES PCI to RCA (2 Taxus 2.75 x 32 & 12) and  Cx-OM (Taxus 3 x 20);; '07 - PCI to pRCA ISR- Cypher DES PCI August 2015 -- PCI to the RCA & OM (in-stent restenosis, and OM). Follow-up Showed patent stents and grafts. July 18-20, 2018 non-STEMI: Cath revealed pLAD CTO (not PCI target). Patent LIMA-LAD & RCA with patent LCx-OM stents  -- med Rx:  He was put back on Plavix. Started on Ranexa;  08/19/2021 => ACS/UA (Generalized Weakness & DOE) -> Patent LIMA-LAD & RCA Stents -> dLCx 90% after previous stent -> DES PCI. => ASA/Brilinta; No BB 2/2 Bradcardia.  He also has history of AAA s/p EVAR  April 2018:  Embolization of Lumbar Artery- [facility] (Dr. Barbie Banner). Jan 2018: IMA "stabilization" by Dr. Donzetta Matters. June 2018-- f/u with Dr Donzetta Matters ([facility]) for AAA -  Noted to have a type II endoleak.   Coil embolization March 25, 2017 Recent CT abdomen pelvis with possible persistent endoleak resulting in aneurysmal sac enlargement.  He was advised to follow-up with the [facility] as outpatient. PAD: Describes claudication symptoms in both calves after walking to having 3 blocks He is intolerant of statins currently on fenofibrate (having stopped Zetia).=> Not interested in PCSK9 inhibitor or Nexletol. CKD-baseline Cr 1.54.   EARNESTINE TUOHEY was last seen  on September 16, 2021 by Fabian Sharp, PA as a TOC follow-up post cath.  Noted that weakness was his anginal equivalent.  He had some persistent dyspnea, and energy not fully back to normal.  Walking limited by back and knee pain. Was not interested in discussing PCSK9 inhibitor.  Referred back to Dr. Donzetta Matters  from [facility]  Seen by Dr. Servando Snare on October 21, 2021 = indicated that Mr. Holaway has been followed by Dr. Barbie Banner from IR - last seen in Sept 2022. - No new Sx.  => per IR note, Pt not intereseted in new procedures - RTC 1 yr.   Recent Hospitalizations: none  Reviewed  CV studies:    The following studies were reviewed today: (if available, images/films reviewed: From Epic Chart or Care Everywhere) Echo 06/2021: Mild increase LV function with EF 45 to 50%.  Moderate HK of basal-mid inferior and inferolateral wall.  GR 1 DD-severely dilated LA.  Moderate AOV calcification and leaflet thickening.  Mild AS (MG 11 mmHg) mild to moderate MR with moderate MAC.  Mildly reduced RV function.   Cath: 08/20/21: DES PCI distal LCx-OM distal stent edge 99%-2.5 mm to 15 mm Onyx Frontier DES postdilated 2.8.  Abnormal haziness in the mid RCA stent-no change from 4 years prior.  Recommend 54-monthDAPT.     Ost LAD to Prox LAD lesion is 100% stenosed.  Patent LIMA-LAD   Previously placed Prox RCA to Mid RCA stent (unknown type) is  widely patent.   Previously placed Mid Cx to Dist Cx stent (unknown type) is  widely patent.      Dist Cx lesion is 99% stenosed-just distal to prior stent.A 2.5 By 15 Mm Onyx Frontier DES was successfully placed (and postdilated 2.8 mm).   Post intervention, there is a 0% residual stenosis.      Interval History:   Julian BELAIRreturns for 665-monthollow-up stating that he just does not feel as well as he used to feel after PCI.  He is still tired and does get some exertional dyspnea.  He has left arm and neck pain where he just feels like he is not.  It is there almost all  the time.  He had 1 episode  shortly after PCI we had a short spell of chest pain lasted less than 10 minutes.  Otherwise he has not any chest pain.  He has become somewhat sedentary unfortunately, indicating that his balance is off a lot.  He has a hard time with walking without a cane.  He has had 3 falls in the last few months.  Follow-up in losing his footing or tripped.  No LOC.  No syncope or near syncope.   Apparently his iron level is down and he is due for colonoscopy soon.  No melena, hematochezia or hematuria.  No epistaxis.  He has had some fatigue and some exertional dyspnea but not like he has the time of his PCI.  No PND, or orthopnea but he has noted some edema.  No irregular heartbeats or palpitations.  He does note a nagging issue with his breathing as if he cannot catch his breath.   REVIEWED OF SYSTEMS   Review of Systems  Constitutional:  Positive for malaise/fatigue and weight loss.  HENT:  Negative for congestion and nosebleeds.   Respiratory:  Positive for shortness of breath (With exertion, but not routine activity.). Negative for cough.   Cardiovascular:  Positive for chest pain (1 episode as noted in HPI.  Was short-lived sharp pain.). Negative for claudication and leg swelling.  Gastrointestinal:  Negative for blood in stool and melena.       Due for Colonscopy  Genitourinary:  Positive for frequency (Frequent nocturia every couple hours.). Negative for dysuria and hematuria.  Musculoskeletal:  Positive for back pain (Chronic back and hip pain.  Limits his mobility.), joint pain and neck pain.       He has numbness and tingling and weakness from his left neck to his left arm.  He has less range of motion.  No strength.  Psychiatric/Behavioral:  Positive for depression and memory loss. The patient is not nervous/anxious and does not have insomnia.        Seems to be down some, noting symptoms that may be consistent with anhedonia, poor sleep and eating [employer].   I  have reviewed and (if needed) personally updated the patient's problem list, medications, allergies, past medical and surgical history, social and family history.   PAST MEDICAL HISTORY   Past Medical History:  Diagnosis Date   Adenomatous colon polyp    Ankle edema    Chronic   Asthma    CAD S/P percutaneous coronary angioplasty 2004   a) '04: Staged Taxus DES PCI to RCA (2 Taxus 2.75 x 32 & 12) and Cx-OM (Taxus 3 x 20);;'07 - PCI to pRCA ISR- Cypher DES; c) 05/2014: pPCI pRCA stentISR (Promus DES 3 x 8) OM1 distal to stent (Promus DES 2.75 x 12 - 3.1); 11/'22: distal LCx stent edge 99% -> Onyx Frontier DES 2.5 x 15 - 2.8 (overlaps prior stent)   CAD, multiple vessel 1985   Most recent July 2018: Admitted for non-STEMI. Occluded LAD with patent LIMA (SP1 now occluded).  Patent stents in proximal and mid RCA as well as circumflex-OM 3. Known occlusion of SVG-OM. EF 45-50%   CHF (congestive heart failure) ([place])    Cholelithiasis - with cholangitis & choledocholithiasis    status post ERCP with removal of calculi and biliary stent placement.   Chronic anemia    On iron supplement; history of positive guaiac - negative colonoscopy in 1996.; Thought to be related to hemorrhoids; status post hemorrhoidectomy   Chronic back pain  multiple surgeries; C-spine and lumbar   Diabetes mellitus    on oral medication   Diverticulitis of colon 1996   Diverticulosis    Dyslipidemia, goal LDL below 70    Erectile dysfunction    Exertional dyspnea    Chronic baseline SOB with ambulation   GERD (gastroesophageal reflux disease)    Grade II diastolic dysfunction    H/O: pneumonia 11/2012   Hemorrhoids    Hiatal hernia    History of: ST elevation myocardial infarction (STEMI) involving left circumflex coronary artery with complication 4239   PTCA-circumflex; PCI in 1991   Hypertension    Moderate aortic stenosis by prior echocardiogram 07/2015   Normal LV function - EF 55-60%.. Abnormal  relaxation. Mild-moderate aortic stenosis (peak/mean gradient 18/10 mmH)   Osteoarthritis of both knees    And back; multiple back surgeries, right knee arthroplasty and left knee arthroscopic surgery x2   Pneumonia 2016   S/P AAA (abdominal aortic aneurysm) repair 08/30/2012   s/p EVAR   S/P CABG x 2 1997   LIMA-LAD, SVG-OM   Sleep apnea    pt. states he was told to return for f/u, to be fitted for Cpap, but pt. reports that he didn't follow up-no cpap used   Statin myopathy 09/30/2015    PAST SURGICAL HISTORY   Past Surgical History:  Procedure Laterality Date   Abdominal and Lower Extremity Arterial Ultrasound  08/23/2012; 10/10/2013   Normal ABIs. Nonocclusive lower extremity disease. 4.2 cm x 4.3 cm infrarenal AAA;; 4.4 cm x 4.3 cm (essentially stable)    ABDOMINAL AORTIC ENDOVASCULAR STENT GRAFT N/A 08/13/2015   Procedure: ABDOMINAL AORTIC ENDOVASCULAR STENT GRAFT;  Surgeon: Mal Misty, MD;  [facility];  Service: Vascular;  Laterality: N/A;   Anterior cervical plating  04/23/2010   At C4-5 and a C6-7 utilizing two separate Biomet MaxAn plates.   ANTERIOR LAT LUMBAR FUSION Left 11/22/2012   Procedure: ANTERIOR LATERAL LUMBAR FUSION 1 LEVEL;  Surgeon: Eustace Moore, MD;  Location: [place] [facility];  Service: Neurosurgery;  Laterality: Left;  Anterior Lateral Lumbar Fusion Lumbar Three-Four   BACK SURGERY  1979 & x 10   pt. remarks, "I have had about 10 back surgeries"   BILIARY STENT PLACEMENT N/A 07/03/2014   Procedure: BILIARY STENT PLACEMENT;  Surgeon: Inda Castle, MD;  Location: [place];  Service: Endoscopy;  Laterality: N/A;   CARPAL TUNNEL RELEASE Left 11/16/2019   Procedure: LEFT CARPAL TUNNEL RELEASE;  Surgeon: Mcarthur Rossetti, MD;  Location: [facility];  Service: Orthopedics;  Laterality: Left;   CATARACT EXTRACTION     Cervical arthrodesis  04/23/2010   Anterior cervical arthrodesis, C4-5, C6-7 utilizing 7-mm PEEK interbody cage packed with local  autograft & Antifuse putty at C4-5 & an 8-mm cage at C6-7.   CERVICAL DISCECTOMY  04/23/2010   Decompressive anterior carvical diskectomy. C4-5, C6-7   CHOLECYSTECTOMY N/A 05/23/2015   Procedure: LAPAROSCOPIC CHOLECYSTECTOMY WITH INTRAOPERATIVE CHOLANGIOGRAM;  Surgeon: Alphonsa Overall, MD;  Location: [facility];  Service: General;  Laterality: N/A;   [place]  1985,1991,15   1985 lateral STEMI Circumflex PTCA;    CORONARY ARTERY BYPASS GRAFT  1997   LIMA-LAD, SVG-OM (SVG known to be occluded prior to 2004)   CORONARY STENT INTERVENTION N/A 08/20/2021   Procedure: CORONARY STENT INTERVENTION;  Surgeon: Lorretta Harp, MD;  Location: [place] [facility];  Service: Cardiovascular:  dLCx distal Edge 99% => Onyx Frontier DES 2.5  x 15 (2.8 mm) overlaps prior stent distally.   ERCP N/A 07/03/2014   Procedure: ENDOSCOPIC RETROGRADE CHOLANGIOPANCREATOGRAPHY (ERCP);  Surgeon: Inda Castle, MD;  Location: [place];  Service: Endoscopy;  Laterality: N/A;   ERCP N/A 07/05/2014   Procedure: ENDOSCOPIC RETROGRADE CHOLANGIOPANCREATOGRAPHY (ERCP);  Surgeon: Inda Castle, MD;  Location: [place];  Service: Endoscopy;  Laterality: N/A;   ERCP N/A 06/30/2015   Procedure: ENDOSCOPIC RETROGRADE CHOLANGIOPANCREATOGRAPHY (ERCP);  Surgeon: Ladene Artist, MD;  Location: [facility];  Service: Endoscopy;  Laterality: N/A;   EYE SURGERY     IR ANGIOGRAM EXTREMITY LEFT  01/18/2017   IR ANGIOGRAM PELVIS SELECTIVE OR SUPRASELECTIVE  01/18/2017   IR ANGIOGRAM SELECTIVE EACH ADDITIONAL VESSEL  01/18/2017   IR AORTAGRAM ABDOMINAL SERIALOGRAM  01/18/2017   IR EMBO ARTERIAL NOT HEMORR HEMANG INC GUIDE ROADMAPPING  01/18/2017   IR RADIOLOGIST EVAL & MGMT  01/04/2017   IR RADIOLOGIST EVAL & MGMT  10/26/2017   IR RADIOLOGIST EVAL & MGMT  05/24/2018   IR RADIOLOGIST EVAL & MGMT  06/19/2019   IR RADIOLOGIST EVAL & MGMT  06/11/2020   IR RADIOLOGIST EVAL & MGMT  07/02/2021   IR US  GUIDE VASC ACCESS RIGHT  01/18/2017   KNEE ARTHROSCOPY Left    x 2   LEFT HEART CATH AND CORS/GRAFTS ANGIOGRAPHY  06/2003   None Occluded vein graft to OM; diffuse RCA disease in the mid vessel, 80% circumflex-OM stenosis; follow on AV groove circumflex with sequential 90% stenoses and intervening saccular dilation    LEFT HEART CATH AND CORS/GRAFTS ANGIOGRAPHY  12/2008   4 abnormal Myoview showing apical thinning (possibly due to apical LAD 95%) : 100% Occluded LAD after SV1, distal LAD grafted via LIMA -apical 95% . Cx -OM1 w/patent stent extending into OM 1 . Follow on Cx - 70-80% - non-amenable PCI. RCA widely patent 3 overlapping stents in mRCA w/less than 40% stenosisin RPL; SVG-OM known occluded    LEFT HEART CATH AND CORS/GRAFTS ANGIOGRAPHY N/A 04/20/2017   Procedure: Left Heart Cath and Cors/Grafts Angiography;  Surgeon: Lorretta Harp, MD;  Location: [facility];  LAD now occluded prior to SP1.LIMA-LAD. Patent RCA and circumflex stents. EF 45-50%. Relatively stable.    LEFT HEART CATH AND CORS/GRAFTS ANGIOGRAPHY N/A 08/20/2021   Procedure: LEFT HEART CATH AND CORS/GRAFTS ANGIOGRAPHY;  Surgeon: Lorretta Harp, MD;  Location: [place] [facility];  Service: Cardiovascular: CULPRIT 99% dLCx just after stent (Overlapping DES PCI) - otw patent LCx stent, patent RCA stent & Patent LIMA-LAD with CTO of prox LAD.   LEFT HEART CATHETERIZATION WITH CORONARY ANGIOGRAM N/A 05/15/2014   Procedure: LEFT HEART CATHETERIZATION WITH CORONARY ANGIOGRAM;  Surgeon: Sinclair Grooms, MD;  Location: [facility] CATH LAB;  Service: Cardiovascular;  Laterality: N/A;   LEFT HEART CATHETERIZATION WITH CORONARY/GRAFT ANGIOGRAM N/A 06/28/2014   Procedure: LEFT HEART CATHETERIZATION WITH Beatrix Fetters;  Surgeon: Troy Sine, MD;  Location[facility] CATH LAB;  Service: Cardiovascular;  Laterality: N/A;   LUMBAR PERCUTANEOUS PEDICLE SCREW 1 LEVEL N/A 11/22/2012   Procedure: LUMBAR PERCUTANEOUS PEDICLE SCREW 1  LEVEL;  Surgeon: Eustace Moore, MD;  Location: [place] [facility];  Service: Neurosurgery;  Laterality: N/A;  Lumbar Three-Four Percutaneous Pedicle Screw, Lateral approach   MINOR HEMORRHOIDECTOMY     NM MYOVIEW LTD  05/26/2021   Lexiscan: Intermediate risk (similar findings to last study), with exception of reduced EF roughly 40%..  Prior basal inferior-inferolateral infarct without peri-infarct ischemia.   ->  Echo ordered -> (EF 45 to 50%)-when compared to prior Myoview, infarct appears similar.   PERCUTANEOUS CORONARY STENT INTERVENTION (PCI-S)  06/2003   PCI - RCA 2 overlapping Taxus DES 2.75 mm x 32 mm and 2.75 mm x 12 mm (3.0 mm); PCI-Cx-OM1 - Taxus DES 3.0 mm x 20 mm (3.1 mm);    PERCUTANEOUS CORONARY STENT INTERVENTION (PCI-S)  12/2005   80% ISR in proximal Taxus stent in RCA -- covered proximally with Cypher DES 3.0 mm x 12 mm   PERCUTANEOUS CORONARY STENT INTERVENTION (PCI-S) N/A 05/17/2014   Procedure: PERCUTANEOUS CORONARY STENT INTERVENTION (PCI-S);  Surgeon: Sinclair Grooms, MD;  Location: [facility] CATH LAB: PCI pRCA stent ISR - Promus DES 3.0 x 8 (3.25), OM distal stent edge - Promus DES 2.75 x 12 (3.1)   PERIPHERAL VASCULAR CATHETERIZATION  11/03/2016   Procedure: Embolization;  Surgeon: Waynetta Sandy, MD;  Location: [place] [facility] LAB;  Service: Cardiovascular;;   POSTERIOR CERVICAL FUSION/FORAMINOTOMY N/A 05/02/2013   Procedure: POSTERIOR CERVICAL FUSION/FORAMINOTOMY CERVICAL SEVEN THORACIC-ONE;  Surgeon: Eustace Moore, MD;  Location: [place] [facility];  Service: Neurosurgery;  Laterality: N/A;  POSTERIOR CERVICAL FUSION/FORAMINOTOMY CERVICAL SEVEN THORACIC-ONE   SPHINCTEROTOMY N/A 06/30/2015   Procedure: SPHINCTEROTOMY;  Surgeon: Ladene Artist, MD;  Location: [facility];  Service: Endoscopy;  Laterality: N/A;   TOTAL KNEE ARTHROPLASTY Right    TOTAL KNEE ARTHROPLASTY Left 12/20/2016   Procedure: LEFT TOTAL KNEE ARTHROPLASTY;  Surgeon: Vickey Huger, MD;  Location: [place];   Service: Orthopedics;  Laterality: Left;   TRANSTHORACIC ECHOCARDIOGRAM  06/10/2021   EF 45 to 50%.  Mildly reduced function.  Mild hypokinesis of basal-mid inferior and inferolateral wall consistent with prior infarct.  Severe LA dilation.  Mild to moderate MR with moderate MAC.  Mild calcific AS (mean gradient 11 mmHg) --> LV function seems unchanged.  Diastolic function improved.  Wall motion abnormality unchanged.  AS unchanged.   TRANSTHORACIC ECHOCARDIOGRAM  07/2018   Normal LV size.  EF 50 to 55% with mild HK of inferolateral wall.  GRII DD.  Mild AS with mean gradient 15 mm or greater.  Mild ascending aortic dilation.  Mildly increased PA pressures of 41 mmHg   UPPER GI ENDOSCOPY  07/03/2019   VISCERAL ANGIOGRAM  11/03/2016   Procedure: Visceral Angiogram;  Surgeon: Waynetta Sandy, MD;  Location: [place] CV LAB;  Service: Cardiovascular;;    Immunization History  Administered Date(s) Administered   DTaP 02/19/2000   Fluad Quad(high Dose 65+) 06/12/2020   Influenza Split 08/03/2002, 09/22/2006, 07/09/2008, 07/26/2012   Influenza Whole 07/15/2009   Influenza, High Dose Seasonal PF 08/17/2013, 05/29/2015, 10/19/2016, 07/20/2017, 07/20/2018, 06/19/2019, 06/05/2021   PFIZER Comirnaty(Gray Top)Covid-19 Tri-Sucrose Vaccine 09/08/2020   PFIZER(Purple Top)SARS-COV-2 Vaccination 11/09/2019, 12/04/2019   PNEUMOCOCCAL CONJUGATE-20 06/05/2021   Pfizer Covid-19 Vaccine Bivalent Booster 16yr & up 10/27/2021   Pneumococcal Conjugate-13 03/01/2017   Pneumococcal Polysaccharide-23 01/19/2007, 06/04/2014   Tdap 01/19/2007, 08/19/2021   Zoster Recombinat (Shingrix) 06/05/2021, 08/19/2021    MEDICATIONS/ALLERGIES   Current Meds  Medication Sig   acetaminophen (TYLENOL) 500 MG tablet Take 1,000 mg by mouth every 6 (six) hours as needed for mild pain, moderate pain or headache. Reported on 03/16/2016   amLODipine (NORVASC) 5 MG tablet Take 1 tablet (5 mg total) by mouth daily.    aspirin 81 MG chewable tablet Chew 1 tablet (81 mg total) by mouth daily.   Choline Fenofibrate (FENOFIBRIC ACID) 135 MG CPDR Take 1 capsule by mouth  daily.   clopidogrel (PLAVIX) 75 MG tablet Take 1 tablet (75 mg total) by mouth daily.   ezetimibe (ZETIA) 10 MG tablet Take 1 tablet by mouth once daily   Ferrous Sulfate (IRON PO) Take 65 mg by mouth.   furosemide (LASIX) 20 MG tablet Take 1 tablet (20 mg total) by mouth daily as needed.   Multiple Vitamins-Minerals (MULTIVITAMIN WITH MINERALS) tablet Take 1 tablet by mouth daily.   pantoprazole (PROTONIX) 40 MG tablet Take 1 tablet by mouth once daily   ranolazine (RANEXA) 500 MG 12 hr tablet Take 1 tablet by mouth twice daily   sildenafil (VIAGRA) 100 MG tablet TAKE ONE TABLET BY MOUTH DAILY AS NEEDED FOR ERECTILE DYSFUNCTION   valsartan-hydrochlorothiazide (DIOVAN-HCT) 320-12.5 MG tablet Take 1 tablet by mouth once daily   [DISCONTINUED] ticagrelor (BRILINTA) 90 MG TABS tablet Take 1 tablet (90 mg total) by mouth 2 (two) times daily.    Allergies  Allergen Reactions   Oxycodone Shortness Of Breath and Cough   Lisinopril Cough   Statins Other (See Comments)    MYALGIAS    Welchol [Colesevelam Hcl] Itching    SOCIAL HISTORY/FAMILY HISTORY   Reviewed in Epic:  Pertinent findings:  Social History   Tobacco Use   Smoking status: Former    Packs/day: 3.00    Years: 45.00    Pack years: 135.00    Types: Cigarettes    Quit date: 10/05/1983    Years since quitting: 38.4   Smokeless tobacco: Never  Vaping Use   Vaping Use: Never used  Substance Use Topics   Alcohol use: Not Currently    Alcohol/week: 2.0 standard drinks    Types: 2 Cans of beer per week    Comment: 2 or 3 beers per day and more on the weekend   Drug use: No   Social History   Social History Narrative   He is married, father of two, grandfather to 49, great grandfather to two.    Not really getting much exercise now, do to his significant back and hip pain.     He does not smoke and only has an alcoholic beverage.     OBJCTIVE -PE, EKG, labs   Wt Readings from Last 3 Encounters:  02/15/22 204 lb 12.8 oz (92.9 kg)  10/27/21 199 lb 12.8 oz (90.6 kg)  10/21/21 202 lb (91.6 kg)    Physical Exam: BP (!) 160/74   Pulse 66   Ht _0  (1.753 m)   Wt 204 lb 12.8 oz (92.9 kg)   SpO2 99%   BMI 30.24 kg/m  Physical Exam Vitals reviewed.  Constitutional:      General: He is not in acute distress.    Appearance: Normal appearance. He is obese. He is not ill-appearing or toxic-appearing.  HENT:     Head: Normocephalic and atraumatic.  Neck:     Vascular: Normal carotid pulses. No carotid bruit (Radiated AOV murmur) or JVD.  Cardiovascular:     Rate and Rhythm: Normal rate and regular rhythm. No extrasystoles are present.    Chest Wall: PMI is not displaced.     Pulses: Decreased pulses (Diminished 1+ pedal pulses).     Heart sounds: S1 normal and S2 normal. Heart sounds are distant. Murmur (2/6 SEM at RUSB-neck) heard.    No friction rub. No gallop.  Pulmonary:     Effort: Pulmonary effort is normal. No respiratory distress.     Breath sounds: Normal breath sounds. No wheezing or rales.  Chest:     Chest wall: No tenderness.  Musculoskeletal:        General: Swelling (1+ bilateral ankle) present.     Cervical back: Normal range of motion and neck supple.  Skin:    General: Skin is warm and dry.  Neurological:     General: No focal deficit present.     Mental Status: He is alert and oriented to person, place, and time.     Gait: Gait abnormal.  Psychiatric:        Thought Content: Thought content normal.        Judgment: Judgment normal.     Comments: His mood definitely seems down.  He is not upbeat.     Adult ECG Report Not checked  Recent Labs: Recheck today.  Labs reviewed. Lab Results  Component Value Date   CHOL 158 02/15/2022   HDL 49 02/15/2022   LDLCALC 92 02/15/2022   TRIG 91 02/15/2022   CHOLHDL 3.2 02/15/2022    Lab Results  Component Value Date   CREATININE 1.46 (H) 02/15/2022   BUN 19 02/15/2022   NA 140 02/15/2022   K 5.2 02/15/2022   CL 107 (H) 02/15/2022   CO2 19 (L) 02/15/2022      Latest Ref Rng & Units 02/15/2022   11:25 AM 08/26/2021   12:08 PM 08/21/2021    1:36 AM  CBC  WBC 3.4 - 10.8 x10E3/uL 6.9   5.5   7.2    Hemoglobin 13.0 - 17.7 g/dL 11.8   12.0   10.7    Hematocrit 37.5 - 51.0 % 35.6   37.2   32.3    Platelets 150 - 450 x10E3/uL 205   224   168      Lab Results  Component Value Date   HGBA1C 5.6 08/26/2021   ==================================================  COVID-19 Education: The signs and symptoms of COVID-19 were discussed with the patient and how to seek care for testing (follow up with PCP or arrange E-visit).    I spent a total of 33 minutes with the patient spent in direct patient consultation.  Additional time spent with chart review  / charting (studies, outside notes, etc): 28 min -> Films and report reviewed.  Clinic notes reviewed. Total Time: 61 min  Current medicines are reviewed at length with the patient today.  (+/- concerns) - still not interested in Lipid medications.  This visit occurred during the SARS-CoV-2 public health emergency.  Safety protocols were in place, including screening questions prior to the visit, additional usage of staff PPE, and extensive cleaning of exam room while observing appropriate contact time as indicated for disinfecting solutions.  Notice: This dictation was prepared with Dragon dictation along with smart phrase technology. Any transcriptional errors that result from this process are unintentional and may not be corrected upon review.  Studies Ordered:   Orders Placed This Encounter  Procedures   Lipid panel   Comprehensive metabolic panel   CBC   Meds ordered this encounter  Medications   clopidogrel (PLAVIX) 75 MG tablet    Sig: Take 1 tablet (75 mg total) by mouth daily.    Dispense:  90 tablet     Refill:  3   furosemide (LASIX) 20 MG tablet    Sig: Take 1 tablet (20 mg total) by mouth daily as needed.    Dispense:  30 tablet    Refill:  6    Patient Instructions / Medication Changes & Studies & Tests Ordered  Patient Instructions  Medication Instructions:   Stop taking  Brilinta  Start taking Clopidogrel 75 mg  -  tomorrow 02/16/22-- the first day take 4 tablets , then take 75 mg daily    May take Furosemide 20 mg  as needed    *If you need a refill on your cardiac medications before your next appointment, please call your pharmacy*   Lab Work:fasting CBC CMP LIPID  If you have labs (blood work) drawn today and your tests are completely normal, you will receive your results only by: [place] (if you have MyChart) OR A paper copy in the mail If you have any lab test that is abnormal or we need to change your treatment, we will call you to review the results.   Testing/Procedures: NOT NEEDED   Follow-Up: At [facility], you and your health needs are our priority.  As part of our continuing mission to provide you with exceptional heart care, we have created designated Provider Care Teams.  These Care Teams include your primary Cardiologist (physician) and Advanced Practice Providers (APPs -  Physician Assistants and Nurse Practitioners) who all work together to provide you with the care you need, when you need it.  We recommend signing up for the patient portal called "MyChart".  Sign up information is provided on this After Visit Summary.  MyChart is used to connect with patients for Virtual Visits (Telemedicine).  Patients are able to view lab/test results, encounter notes, upcoming appointments, etc.  Non-urgent messages can be sent to your provider as well.   To learn more about what you can do with MyChart, go to [email or web address].    Your next appointment:   1 month(s)  The format for your next appointment:   In Person  Provider:    Glenetta Hew, MD       Important Information About [place]            Glenetta Hew, M.D., M.S. Interventional Cardiologist   Pager # [phone] Phone # [phone] [address] [place], [place] [address]   Thank you for choosing Heartcare at [facility]!!

## 2022-02-16 LAB — CBC
Hematocrit: 35.6 % — ABNORMAL LOW (ref 37.5–51.0)
Hemoglobin: 11.8 g/dL — ABNORMAL LOW (ref 13.0–17.7)
MCH: 31.9 pg (ref 26.6–33.0)
MCHC: 33.1 g/dL (ref 31.5–35.7)
MCV: 96 fL (ref 79–97)
Platelets: 205 10*3/uL (ref 150–450)
RBC: 3.7 x10E6/uL — ABNORMAL LOW (ref 4.14–5.80)
RDW: 13.3 % (ref 11.6–15.4)
WBC: 6.9 10*3/uL (ref 3.4–10.8)

## 2022-02-16 LAB — COMPREHENSIVE METABOLIC PANEL
ALT: 18 IU/L (ref 0–44)
AST: 27 IU/L (ref 0–40)
Albumin/Globulin Ratio: 2 (ref 1.2–2.2)
Albumin: 4.3 g/dL (ref 3.6–4.6)
Alkaline Phosphatase: 40 IU/L — ABNORMAL LOW (ref 44–121)
BUN/Creatinine Ratio: 13 (ref 10–24)
BUN: 19 mg/dL (ref 8–27)
Bilirubin Total: 0.4 mg/dL (ref 0.0–1.2)
CO2: 19 mmol/L — ABNORMAL LOW (ref 20–29)
Calcium: 9.5 mg/dL (ref 8.6–10.2)
Chloride: 107 mmol/L — ABNORMAL HIGH (ref 96–106)
Creatinine, Ser: 1.46 mg/dL — ABNORMAL HIGH (ref 0.76–1.27)
Globulin, Total: 2.2 g/dL (ref 1.5–4.5)
Glucose: 120 mg/dL — ABNORMAL HIGH (ref 70–99)
Potassium: 5.2 mmol/L (ref 3.5–5.2)
Sodium: 140 mmol/L (ref 134–144)
Total Protein: 6.5 g/dL (ref 6.0–8.5)
eGFR: 47 mL/min/{1.73_m2} — ABNORMAL LOW (ref >59)

## 2022-02-16 LAB — LIPID PANEL
Chol/HDL Ratio: 3.2 ratio (ref 0.0–5.0)
Cholesterol, Total: 158 mg/dL (ref 100–199)
HDL: 49 mg/dL (ref >39)
LDL Chol Calc (NIH): 92 mg/dL (ref 0–99)
Triglycerides: 91 mg/dL (ref 0–149)
VLDL Cholesterol Cal: 17 mg/dL (ref 5–40)

## 2022-02-20 ENCOUNTER — Encounter: Payer: Self-pay | Admitting: Cardiology

## 2022-02-20 NOTE — Assessment & Plan Note (Addendum)
Patent LIMA-LAD with CTO of Native LAD -- Known CTO SVG-OM = distal LCx now with 2 overlapping DES.

## 2022-02-20 NOTE — Assessment & Plan Note (Signed)
Multivessel disease, NYHA class II symptoms with some exertional dyspnea and fatigue.  Now started have some edema but no real PND orthopnea.  No beta-blocker because of fatigue.  He is on valsartan/HCTZ for afterload reduction along with amlodipine for additional blood pressure and antianginal control.  With him having some dyspnea on exertion and edema, will start low-dose Lasix 20 mg starting his PRN but recommend taking at least once twice a week.

## 2022-02-20 NOTE — Assessment & Plan Note (Signed)
Mild AS noted on echo in September 2022: Can probably wait until 2025 to reassess unless the murmur gets worse.

## 2022-02-20 NOTE — Assessment & Plan Note (Deleted)
Multivessel disease, NYHA class II symptoms with some exertional dyspnea and fatigue.  Now started have some edema but no real PND orthopnea.  No beta-blocker because of fatigue.  He is on valsartan/HCTZ for afterload reduction along with amlodipine for additional blood pressure and antianginal control.  With him having some dyspnea on exertion and edema, will start low-dose Lasix 20 mg starting his PRN but recommend taking at least once twice a week.

## 2022-02-20 NOTE — Assessment & Plan Note (Signed)
History of MI confirmed on Myoview he is also subsequently had non-STEMI as well.  Infarct noted with mildly reduced EF, but no active symptoms and no real significant systolic dysfunction issues.

## 2022-02-20 NOTE — Assessment & Plan Note (Signed)
Consider referral for vestibular therapy.

## 2022-02-20 NOTE — Assessment & Plan Note (Signed)
Followed closely by PCP.  We we went ahead and recheck chemistry panel today since I was also checking his lipids = > creatinine pretty stable at 1.46.

## 2022-02-20 NOTE — Assessment & Plan Note (Signed)
Most recent cath was in November 2022-patent LIMA to LAD and stents in the RCA with LCx.  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 His symptoms are usually just fatigue.  As such, we have taken away beta-blocker because berry borderline bradycardia and fatigue he still complains of fatigue I think some of this is related to depression as well.  He was switched from clopidogrel to Brilinta with his last PCI, but I think he is having some of the dyspnea issues associated with Brilinta.  For now 6 months out, and both for the breathing issues and the financial issues, he would like to switch.  Plan:  Convert from Brilinta to Plavix starting tomorrow: => Day 1 take 4 doses, and then 175 mg dose daily going forward.  Continue aspirin 81 mg for DAPT  He is now on amlodipine and Ranexa for antianginal benefit along with valsartan and HCTZ.  Echo reduction.  I tried to talk to him again about adding Nexletol to the Zetia and fenofibrate, but he was reluctant to do so.  Plan was to recheck lipids today and discuss options and follow-up visit in 1 month.

## 2022-02-20 NOTE — Assessment & Plan Note (Signed)
Pretty significant bruising, worse with Brilinta.  Unfortunately he is still on DAPT.  Once he gets out into the November timeframe we can probably stop aspirin and continue lifelong Plavix.

## 2022-02-20 NOTE — Assessment & Plan Note (Signed)
Statin intolerance.  Has tried multiple statins.  Not willing to try anymore.  Also not comfortable trying new medications at this point given his advanced age.

## 2022-02-20 NOTE — Assessment & Plan Note (Signed)
Unfortunately, he has not been tolerant of any medications other than Zetia and fenofibrate.  I stressed to him the importance of aggressively lowering levels of lipid.  In addition to diet, also change in activity.  Labs being checked today.  Not quite at goal.  Can discuss options in follow-up visit.  He has been reluctant to try to start anything new..  Perhaps we could consider inclisiran is an option.

## 2022-03-09 ENCOUNTER — Other Ambulatory Visit: Payer: Self-pay | Admitting: Family Medicine

## 2022-03-09 DIAGNOSIS — I110 Hypertensive heart disease with heart failure: Secondary | ICD-10-CM

## 2022-03-18 ENCOUNTER — Telehealth: Payer: Self-pay | Admitting: *Deleted

## 2022-03-18 MED ORDER — NEXLETOL 180 MG PO TABS: 180.0000 mg | ORAL_TABLET | Freq: Every day | ORAL | 6 refills | Status: DC

## 2022-03-18 NOTE — Telephone Encounter (Signed)
Spoke to wife - she  has been notified of the result and verbalized understanding.  All questions (if any) were answered. Prescription e- sent to the pharmacy  #30 day supply  x6 refills Raiford Simmonds, RN 03/18/2022 11:44 AM

## 2022-03-18 NOTE — Telephone Encounter (Signed)
-----   Message from Leonie Man, MD sent at 02/16/2022  4:29 PM EDT ----- Kermit Balo news.  Kidney function looks back to the baseline.  Much improved from 5 months ago.,  Blood counts are stable.  Cholesterol levels are also improved.  LDL is down to 92, from 124.  Total cholesterol down to 158, from 197.  We would still like it to be a little bit better than that. Would like to try to add Nexletol180 mg daily -> this works in conjunction with the [place].  Is not a statin.  Glenetta Hew, MD

## 2022-03-23 ENCOUNTER — Telehealth: Payer: Self-pay

## 2022-03-23 NOTE — Telephone Encounter (Signed)
**Note De-Identified Charo Philipp Obfuscation** Nexletol PA started through covermymeds. Key: Pennie Rushing

## 2022-03-26 ENCOUNTER — Encounter: Payer: Self-pay | Admitting: Cardiology

## 2022-03-26 ENCOUNTER — Telehealth (INDEPENDENT_AMBULATORY_CARE_PROVIDER_SITE_OTHER): Payer: Medicare Other | Admitting: Cardiology

## 2022-03-26 ENCOUNTER — Telehealth: Payer: Self-pay | Admitting: *Deleted

## 2022-03-26 VITALS — BP 114/50 | HR 69 | Ht 69.0 in | Wt 202.0 lb

## 2022-03-26 DIAGNOSIS — G720 Drug-induced myopathy: Secondary | ICD-10-CM

## 2022-03-26 DIAGNOSIS — I5032 Chronic diastolic (congestive) heart failure: Secondary | ICD-10-CM | POA: Diagnosis not present

## 2022-03-26 DIAGNOSIS — T466X5D Adverse effect of antihyperlipidemic and antiarteriosclerotic drugs, subsequent encounter: Secondary | ICD-10-CM

## 2022-03-26 DIAGNOSIS — I25119 Atherosclerotic heart disease of native coronary artery with unspecified angina pectoris: Secondary | ICD-10-CM | POA: Diagnosis not present

## 2022-03-26 DIAGNOSIS — I350 Nonrheumatic aortic (valve) stenosis: Secondary | ICD-10-CM

## 2022-03-26 DIAGNOSIS — E785 Hyperlipidemia, unspecified: Secondary | ICD-10-CM | POA: Diagnosis not present

## 2022-03-26 DIAGNOSIS — R2689 Other abnormalities of gait and mobility: Secondary | ICD-10-CM

## 2022-03-26 DIAGNOSIS — I110 Hypertensive heart disease with heart failure: Secondary | ICD-10-CM

## 2022-03-26 DIAGNOSIS — R5383 Other fatigue: Secondary | ICD-10-CM

## 2022-03-26 DIAGNOSIS — Z955 Presence of coronary angioplasty implant and graft: Secondary | ICD-10-CM

## 2022-03-26 NOTE — Assessment & Plan Note (Signed)
Most recent labs showed LDL 92.  We tried to get him on Nexletol and he was willing to go to try.  Unfortunately we are still in process doing prior authorization.  Once it gets authorized, then the plan will be for him to start it but I want him to have his MRI of his hips done first in order to avoid Consideration of natural symptoms with potential adverse reaction to medication.

## 2022-03-26 NOTE — Assessment & Plan Note (Signed)
Plan is follow-up echo in 2025

## 2022-03-26 NOTE — Assessment & Plan Note (Signed)
He has tried multiple statins and did not work.  He was reluctant to do PCSK9 meters.  He finally agreed to do Nexletol, but for some reason the prescription was not filled.  Apparently needs prior authorization.  We will refill, and provide for authorization

## 2022-03-31 NOTE — Telephone Encounter (Signed)
**Note De-Identified Abril Cappiello Obfuscation** Dannielle Karvonen Key: Pennie Rushing - PA Case ID: [id number] - Rx #: [id number] Outcome: Approved on June 20 NEXLETOL TAB '180MG'$  is approved through 09/22/2022. Your patient may now fill this prescription and it will be covered. Drug: Nexletol '180MG'$  tablets Form: [employer] Medicare Part D Electronic Prior Authorization Form (2017 NCPDP) Original Claim Info [place] ([place] (Ph: [phone]) is aware of this approval.

## 2022-04-08 ENCOUNTER — Other Ambulatory Visit: Payer: Self-pay | Admitting: Cardiology

## 2022-04-08 ENCOUNTER — Encounter: Payer: Self-pay | Admitting: Family Medicine

## 2022-04-08 DIAGNOSIS — E785 Hyperlipidemia, unspecified: Secondary | ICD-10-CM

## 2022-04-20 ENCOUNTER — Telehealth: Payer: Self-pay

## 2022-04-20 NOTE — Telephone Encounter (Signed)
   Pre-operative Risk Assessment    Patient Name: [name]  DOB: Jun 23, 1936 MRN: [id number]      Request for Surgical Clearance    Procedure:   Lumbar Spine Transforaminal-L2-L3 bilateral  Date of Surgery:  Clearance 05/18/22                                 Surgeon:  Dr. Lenord Carbo Surgeon's Group or Practice Name:  [facility] Phone number:  [phone] Fax number:  [phone]   Type of Clearance Requested:   - Pharmacy:  Hold Clopidogrel (Plavix) 7   Type of Anesthesia:  Not Indicated   Additional requests/questions:    Signed, Wonda Horner   04/20/2022, 3:25 PM

## 2022-04-22 NOTE — Telephone Encounter (Signed)
Dr. Ellyn Hack,  Request for patient to hold Plavix for 7 days prior to Lumbar spine transforaminal L2-L3. Per your note 02/15/22, he was transitioned from Brilinta to Plavix with plan to continue DAPT for 1 year from 08/2021 DES to dist Cx lesion.   May he hold Plavix for 5 days for upcoming colonoscopy? Please direct your response to p cv div preop.  Thank you, Emmaline Life, NP-C    04/22/2022, 11:53 AM [place] [address] Office [phone] Fax [phone]

## 2022-04-22 NOTE — Telephone Encounter (Signed)
In general, unless it is urgent need for colonoscopy, I would recommend delaying the colonoscopy until after November.  There is reason why we want to continue DAPT.  If there is urgent reason for colonoscopy, then he is 6 months out and it is acceptable to do, but is not preferable.   Glenetta Hew, MD

## 2022-04-23 NOTE — Telephone Encounter (Signed)
I left a message for [name] with Dr. Dionne Ano office to please let our office know if the pt's procedure is of urgent matter. Please see recommendations from Dr. Ellyn Hack in regard to he would prefer to not hold antiplatelet until Nov 2023. See notes. If procedure is urgent we will re-visit.

## 2022-04-23 NOTE — Telephone Encounter (Signed)
Per Dr. Ellyn Hack, unless need for lumbar spine procedure is urgent, it would be best to delay holding antiplatelet therapy until November.  Please contact patient and requesting provider to determine urgency of upcoming procedure.  Thank you Emmaline Life, NP-C    04/23/2022, 7:52 AM [place] [address] Office [phone] Fax [phone]

## 2022-04-23 NOTE — Telephone Encounter (Signed)
Note that in the communication with Dr. Ellyn Hack there is confusing verbiage regarding the patient's procedure. We have received requests for both a colonoscopy and a lumbar spine transforaminal L2-L3. The plan is the same, that we would only consider holding DAPT prior to 08/2022 if one of the procedures is urgent.   Emmaline Life, NP-C    04/23/2022, 3:20 PM [place] [address] Office [phone] Fax [phone]

## 2022-04-26 NOTE — Telephone Encounter (Signed)
Our office received duplicate clearance request. I called Janett Billow today, surgery scheduler for Dr. Davy Pique. I left message that I left her a message Friday as well, to please see the recommendations from Dr. Ellyn Hack. Per Dr. Ellyn Hack preference to not interrupt DAPT prior to 08/2022; unless URGENT matter. Left message to please let our office know if this is truly an URGENT matter, as this will then need to be re-address with Dr. Ellyn Hack.   I will fax this note over as FYI today as well to Dr. Davy Pique and Janett Billow.   We will wait to hear back from Dr. Davy Pique office if ok to wait until 08/2022 for procedure.

## 2022-04-27 NOTE — Telephone Encounter (Signed)
[name], with DR. [place] office called back to update our office the procedure has been rescheduled to Dec 2023, per notes from Dr. Ellyn Hack, see notes.   I will update the pre op provider as well.   New clearance request will be faxed over to our office around the end of  Sept 2023. I will remove from the pre op call back pool.

## 2022-04-27 NOTE — Telephone Encounter (Signed)
Left message x 3 as of today for [name], surgery scheduler for Dr. Davy Pique to please contact our office to let us know if procedure is of URGENT matter. See previous notes.

## 2022-05-28 ENCOUNTER — Other Ambulatory Visit: Payer: Self-pay | Admitting: Interventional Radiology

## 2022-05-28 DIAGNOSIS — I714 Abdominal aortic aneurysm, without rupture, unspecified: Secondary | ICD-10-CM

## 2022-06-01 ENCOUNTER — Telehealth: Payer: Self-pay | Admitting: Family Medicine

## 2022-06-01 ENCOUNTER — Other Ambulatory Visit: Payer: Self-pay

## 2022-06-01 MED ORDER — ONETOUCH VERIO VI STRP: 1.0000 | ORAL_STRIP | 2 refills | Status: DC | PRN

## 2022-06-01 NOTE — Telephone Encounter (Signed)
Done KH 

## 2022-06-01 NOTE — Telephone Encounter (Signed)
Pt wife called and is requesting test strips please send to the [place] ([place] [place] [facility] [place]

## 2022-06-15 ENCOUNTER — Ambulatory Visit (HOSPITAL_COMMUNITY)
Admission: RE | Admit: 2022-06-15 | Discharge: 2022-06-15 | Disposition: A | Discharge status: Home / Self Care | Payer: Medicare Other | Source: Ambulatory Visit | Attending: Interventional Radiology | Admitting: Interventional Radiology

## 2022-06-15 DIAGNOSIS — I714 Abdominal aortic aneurysm, without rupture, unspecified: Secondary | ICD-10-CM | POA: Insufficient documentation

## 2022-06-22 ENCOUNTER — Ambulatory Visit
Admission: RE | Admit: 2022-06-22 | Discharge: 2022-06-22 | Disposition: A | Discharge status: Home / Self Care | Payer: Medicare Other | Source: Ambulatory Visit | Attending: Interventional Radiology | Admitting: Interventional Radiology

## 2022-06-22 DIAGNOSIS — I714 Abdominal aortic aneurysm, without rupture, unspecified: Secondary | ICD-10-CM

## 2022-06-22 HISTORY — PX: IR RADIOLOGIST EVAL & MGMT: IMG5224

## 2022-06-22 NOTE — Progress Notes (Signed)
Chief Complaint: EVAR, with type 2 endoleak  Referring Physician(s): Dr. Donzetta Matters.    PCP: Dr. Jill Alexanders   History of Present Illness: [name] is an 86 y.o. male presenting to [facility] today as a scheduled follow up for his type II endoleak.     He is well known to our service, with his first referral to my partner, the late Dr. Barbie Banner, 01/04/2017.     He joins [place] today via telemedicine visit.  We confirmed his identity with 2 personal identifiers.    Hx: He has history of AAA treated with infra-renal fixation 08/13/2015.  This was performed with a Gore Excluder, [id number] main body, via the right, and left limb of 14x10.  The pre-operative diameter was ~5cm.  He had a type II endoleak identified, with interval growth of the excluded sac.  The IMA was embolized via TA approach by Dr. Donzetta Matters 11/03/2016, and then the L4 lumbar arteries were embolized by Dr. Barbie Banner 01/04/2017 for regrowth, to maximum diameter of 5.6cm.    Our surveillance CT scans were initiated June of 2018, which have essentially been annual.   The baseline of 2018 shows estimated diameter of ~65m.  CT imaging from 06/25/21 shows estimated 644mat similar location, by my estimate.      Interval:  [name] me that he is feeling fine, with no abdominal pain, and no concerning back or flank pain.  He does have MSK back pain that has always been there, and he tells me today that he has pending "back surgery" that wont happen until at least November or December.   Shortly after we had him in follow up last September, he was admitted for an MI, and had DES placed.  He currently is on DAPT, and is one reason that his spine surgery needs some delay.   He tells me over all he is feeling fairly well.     He continues maximal medical care, including medication for blood pressure control  CT without contrast 06/15/22 was estimated 6966mand by my estimate is about 56m32mo concerning inflammatory features.   Last  creatinine was 1.46 on 02/15/22  Past Medical History:  Diagnosis Date   Adenomatous colon polyp    Ankle edema    Chronic   Asthma    CAD S/P percutaneous coronary angioplasty 2004   a) '04: Staged Taxus DES PCI to RCA (2 Taxus 2.75 x 32 & 12) and Cx-OM (Taxus 3 x 20);;'07 - PCI to pRCA ISR- Cypher DES; c) 05/2014: pPCI pRCA stentISR (Promus DES 3 x 8) OM1 distal to stent (Promus DES 2.75 x 12 - 3.1); 11/'22: distal LCx stent edge 99% -> Onyx Frontier DES 2.5 x 15 - 2.8 (overlaps prior stent)   CAD, multiple vessel 1985   Most recent July 2018: Admitted for non-STEMI. Occluded LAD with patent LIMA (SP1 now occluded).  Patent stents in proximal and mid RCA as well as circumflex-OM 3. Known occlusion of SVG-OM. EF 45-50%   CHF (congestive heart failure) (HCC)[place]ye Cholelithiasis - with cholangitis & choledocholithiasis    status post ERCP with removal of calculi and biliary stent placement.   Chronic anemia    On iron supplement; history of positive guaiac - negative colonoscopy in 1996.; Thought to be related to hemorrhoids; status post hemorrhoidectomy   Chronic back pain     multiple surgeries; C-spine and lumbar   Diabetes mellitus    on oral medication  Diverticulitis of colon 1996   Diverticulosis    Dyslipidemia, goal LDL below 70    Erectile dysfunction    Exertional dyspnea    Chronic baseline SOB with ambulation   GERD (gastroesophageal reflux disease)    Grade II diastolic dysfunction    H/O: pneumonia 11/2012   Hemorrhoids    Hiatal hernia    History of: ST elevation myocardial infarction (STEMI) involving left circumflex coronary artery with complication 9798   PTCA-circumflex; PCI in 1991   Hypertension    Moderate aortic stenosis by prior echocardiogram 07/2015   Normal LV function - EF 55-60%.. Abnormal relaxation. Mild-moderate aortic stenosis (peak/mean gradient 18/10 mmH)   Osteoarthritis of both knees    And back; multiple back surgeries, right knee arthroplasty  and left knee arthroscopic surgery x2   Pneumonia 2016   S/P AAA (abdominal aortic aneurysm) repair 08/30/2012   s/p EVAR   S/P CABG x 2 1997   LIMA-LAD, SVG-OM   Sleep apnea    pt. states he was told to return for f/u, to be fitted for Cpap, but pt. reports that he didn't follow up-no cpap used   Statin myopathy 09/30/2015    Past Surgical History:  Procedure Laterality Date   Abdominal and Lower Extremity Arterial Ultrasound  08/23/2012; 10/10/2013   Normal ABIs. Nonocclusive lower extremity disease. 4.2 cm x 4.3 cm infrarenal AAA;; 4.4 cm x 4.3 cm (essentially stable)    ABDOMINAL AORTIC ENDOVASCULAR STENT GRAFT N/A 08/13/2015   Procedure: ABDOMINAL AORTIC ENDOVASCULAR STENT GRAFT;  Surgeon: Mal Misty, MD;  [facility];  Service: Vascular;  Laterality: N/A;   Anterior cervical plating  04/23/2010   At C4-5 and a C6-7 utilizing two separate Biomet MaxAn plates.   ANTERIOR LAT LUMBAR FUSION Left 11/22/2012   Procedure: ANTERIOR LATERAL LUMBAR FUSION 1 LEVEL;  Surgeon: Eustace Moore, MD;  Location: [place] [facility];  Service: Neurosurgery;  Laterality: Left;  Anterior Lateral Lumbar Fusion Lumbar Three-Four   BACK SURGERY  1979 & x 10   pt. remarks, "I have had about 10 back surgeries"   BILIARY STENT PLACEMENT N/A 07/03/2014   Procedure: BILIARY STENT PLACEMENT;  Surgeon: Inda Castle, MD;  Location: [place];  Service: Endoscopy;  Laterality: N/A;   CARPAL TUNNEL RELEASE Left 11/16/2019   Procedure: LEFT CARPAL TUNNEL RELEASE;  Surgeon: Mcarthur Rossetti, MD;  Location: [facility];  Service: Orthopedics;  Laterality: Left;   CATARACT EXTRACTION     Cervical arthrodesis  04/23/2010   Anterior cervical arthrodesis, C4-5, C6-7 utilizing 7-mm PEEK interbody cage packed with local autograft & Antifuse putty at C4-5 & an 8-mm cage at C6-7.   CERVICAL DISCECTOMY  04/23/2010   Decompressive anterior carvical diskectomy. C4-5, C6-7   CHOLECYSTECTOMY N/A 05/23/2015   Procedure:  LAPAROSCOPIC CHOLECYSTECTOMY WITH INTRAOPERATIVE CHOLANGIOGRAM;  Surgeon: Alphonsa Overall, MD;  Location: [facility];  Service: General;  Laterality: N/A;   [place]  1985,1991,15   1985 lateral STEMI Circumflex PTCA;    CORONARY ARTERY BYPASS GRAFT  1997   LIMA-LAD, SVG-OM (SVG known to be occluded prior to 2004)   CORONARY STENT INTERVENTION N/A 08/20/2021   Procedure: CORONARY STENT INTERVENTION;  Surgeon: Lorretta Harp, MD;  Location: [place] CV LAB;  Service: Cardiovascular:  dLCx distal Edge 99% => Onyx Frontier DES 2.5 x 15 (2.8 mm) overlaps prior stent distally.   ERCP N/A 07/03/2014   Procedure: ENDOSCOPIC RETROGRADE CHOLANGIOPANCREATOGRAPHY (ERCP);  Surgeon:  Inda Castle, MD;  Location: [place];  Service: Endoscopy;  Laterality: N/A;   ERCP N/A 07/05/2014   Procedure: ENDOSCOPIC RETROGRADE CHOLANGIOPANCREATOGRAPHY (ERCP);  Surgeon: Inda Castle, MD;  Location: [place];  Service: Endoscopy;  Laterality: N/A;   ERCP N/A 06/30/2015   Procedure: ENDOSCOPIC RETROGRADE CHOLANGIOPANCREATOGRAPHY (ERCP);  Surgeon: Ladene Artist, MD;  Location: [facility];  Service: Endoscopy;  Laterality: N/A;   EYE SURGERY     IR ANGIOGRAM EXTREMITY LEFT  01/18/2017   IR ANGIOGRAM PELVIS SELECTIVE OR SUPRASELECTIVE  01/18/2017   IR ANGIOGRAM SELECTIVE EACH ADDITIONAL VESSEL  01/18/2017   IR AORTAGRAM ABDOMINAL SERIALOGRAM  01/18/2017   IR EMBO ARTERIAL NOT HEMORR HEMANG INC GUIDE ROADMAPPING  01/18/2017   IR RADIOLOGIST EVAL & MGMT  01/04/2017   IR RADIOLOGIST EVAL & MGMT  10/26/2017   IR RADIOLOGIST EVAL & MGMT  05/24/2018   IR RADIOLOGIST EVAL & MGMT  06/19/2019   IR RADIOLOGIST EVAL & MGMT  06/11/2020   IR RADIOLOGIST EVAL & MGMT  07/02/2021   IR US GUIDE VASC ACCESS RIGHT  01/18/2017   KNEE ARTHROSCOPY Left    x 2   LEFT HEART CATH AND CORS/GRAFTS ANGIOGRAPHY  06/2003   None Occluded vein graft to OM; diffuse RCA disease in the mid vessel, 80%  circumflex-OM stenosis; follow on AV groove circumflex with sequential 90% stenoses and intervening saccular dilation    LEFT HEART CATH AND CORS/GRAFTS ANGIOGRAPHY  12/2008   4 abnormal Myoview showing apical thinning (possibly due to apical LAD 95%) : 100% Occluded LAD after SV1, distal LAD grafted via LIMA -apical 95% . Cx -OM1 w/patent stent extending into OM 1 . Follow on Cx - 70-80% - non-amenable PCI. RCA widely patent 3 overlapping stents in mRCA w/less than 40% stenosisin RPL; SVG-OM known occluded    LEFT HEART CATH AND CORS/GRAFTS ANGIOGRAPHY N/A 04/20/2017   Procedure: Left Heart Cath and Cors/Grafts Angiography;  Surgeon: Lorretta Harp, MD;  Location: [facility] INVASIVE CV LAB;  LAD now occluded prior to SP1.LIMA-LAD. Patent RCA and circumflex stents. EF 45-50%. Relatively stable.    LEFT HEART CATH AND CORS/GRAFTS ANGIOGRAPHY N/A 08/20/2021   Procedure: LEFT HEART CATH AND CORS/GRAFTS ANGIOGRAPHY;  Surgeon: Lorretta Harp, MD;  Location: [place] CV LAB;  Service: Cardiovascular: CULPRIT 99% dLCx just after stent (Overlapping DES PCI) - otw patent LCx stent, patent RCA stent & Patent LIMA-LAD with CTO of prox LAD.   LEFT HEART CATHETERIZATION WITH CORONARY ANGIOGRAM N/A 05/15/2014   Procedure: LEFT HEART CATHETERIZATION WITH CORONARY ANGIOGRAM;  Surgeon: Sinclair Grooms, MD;  Location: [facility] CATH LAB;  Service: Cardiovascular;  Laterality: N/A;   LEFT HEART CATHETERIZATION WITH CORONARY/GRAFT ANGIOGRAM N/A 06/28/2014   Procedure: LEFT HEART CATHETERIZATION WITH Beatrix Fetters;  Surgeon: Troy Sine, MD;  Location: [facility] CATH LAB;  Service: Cardiovascular;  Laterality: N/A;   LUMBAR PERCUTANEOUS PEDICLE SCREW 1 LEVEL N/A 11/22/2012   Procedure: LUMBAR PERCUTANEOUS PEDICLE SCREW 1 LEVEL;  Surgeon: Eustace Moore, MD;  Location: [place] [facility] OR[facility];  Service: Neurosurgery;  Laterality: N/A;  Lumbar Three-Four Percutaneous Pedicle Screw, Lateral approach   MINOR HEMORRHOIDECTOMY     NM  MYOVIEW LTD  05/26/2021   Lexiscan: Intermediate risk (similar findings to last study), with exception of reduced EF roughly 40%..  Prior basal inferior-inferolateral infarct without peri-infarct ischemia.   ->  Echo ordered -> (EF 45 to 50%)-when compared to prior Myoview, infarct appears similar.   PERCUTANEOUS CORONARY STENT  INTERVENTION (PCI-S)  06/2003   PCI - RCA 2 overlapping Taxus DES 2.75 mm x 32 mm and 2.75 mm x 12 mm (3.0 mm); PCI-Cx-OM1 - Taxus DES 3.0 mm x 20 mm (3.1 mm);    PERCUTANEOUS CORONARY STENT INTERVENTION (PCI-S)  12/2005   80% ISR in proximal Taxus stent in RCA -- covered proximally with Cypher DES 3.0 mm x 12 mm   PERCUTANEOUS CORONARY STENT INTERVENTION (PCI-S) N/A 05/17/2014   Procedure: PERCUTANEOUS CORONARY STENT INTERVENTION (PCI-S);  Surgeon: Sinclair Grooms, MD;  Location: [facility] CATH LAB: PCI pRCA stent ISR - Promus DES 3.0 x 8 (3.25), OM distal stent edge - Promus DES 2.75 x 12 (3.1)   PERIPHERAL VASCULAR CATHETERIZATION  11/03/2016   Procedure: Embolization;  Surgeon: Waynetta Sandy, MD;  Location: [place] CV LAB;  Service: Cardiovascular;;   POSTERIOR CERVICAL FUSION/FORAMINOTOMY N/A 05/02/2013   Procedure: POSTERIOR CERVICAL FUSION/FORAMINOTOMY CERVICAL SEVEN THORACIC-ONE;  Surgeon: Eustace Moore, MD;  Location: [place] [facility] ORS;  Service: Neurosurgery;  Laterality: N/A;  POSTERIOR CERVICAL FUSION/FORAMINOTOMY CERVICAL SEVEN THORACIC-ONE   SPHINCTEROTOMY N/A 06/30/2015   Procedure: SPHINCTEROTOMY;  Surgeon: Ladene Artist, MD;  Location: [facility];  Service: Endoscopy;  Laterality: N/A;   TOTAL KNEE ARTHROPLASTY Right    TOTAL KNEE ARTHROPLASTY Left 12/20/2016   Procedure: LEFT TOTAL KNEE ARTHROPLASTY;  Surgeon: Vickey Huger, MD;  Location: [place];  Service: Orthopedics;  Laterality: Left;   TRANSTHORACIC ECHOCARDIOGRAM  06/10/2021   EF 45 to 50%.  Mildly reduced function.  Mild hypokinesis of basal-mid inferior and inferolateral wall consistent with  prior infarct.  Severe LA dilation.  Mild to moderate Julian with moderate MAC.  Mild calcific AS (mean gradient 11 mmHg) --> LV function seems unchanged.  Diastolic function improved.  Wall motion abnormality unchanged.  AS unchanged.   TRANSTHORACIC ECHOCARDIOGRAM  07/2018   Normal LV size.  EF 50 to 55% with mild HK of inferolateral wall.  GRII DD.  Mild AS with mean gradient 15 mm or greater.  Mild ascending aortic dilation.  Mildly increased PA pressures of 41 mmHg   UPPER GI ENDOSCOPY  07/03/2019   VISCERAL ANGIOGRAM  11/03/2016   Procedure: Visceral Angiogram;  Surgeon: Waynetta Sandy, MD;  Location: [place] CV LAB;  Service: Cardiovascular;;    Allergies: Oxycodone, Lisinopril, Statins, and Welchol [colesevelam hcl]  Medications: Prior to Admission medications   Medication Sig Start Date End Date Taking? Authorizing Provider  acetaminophen (TYLENOL) 500 MG tablet Take 1,000 mg by mouth every 6 (six) hours as needed for mild pain, moderate pain or headache. Reported on 03/16/2016    [provider]  amLODipine (NORVASC) 5 MG tablet Take 1 tablet (5 mg total) by mouth daily. 10/27/21   Denita Lung, MD  aspirin 81 MG chewable tablet Chew 1 tablet (81 mg total) by mouth daily. 08/22/21   Hongalgi, Lenis Dickinson, MD  Bempedoic Acid (NEXLETOL) 180 MG TABS Take 180 mg by mouth daily. 03/18/22   Leonie Man, MD  Choline Fenofibrate (FENOFIBRIC ACID) 135 MG CPDR TAKE 1 CAPSULE BY MOUTH EVERY DAY 04/08/22   Leonie Man, MD  clopidogrel (PLAVIX) 75 MG tablet Take 1 tablet (75 mg total) by mouth daily. 02/15/22   Leonie Man, MD  ezetimibe (ZETIA) 10 MG tablet Take 1 tablet by mouth once daily 08/17/21   Leonie Man, MD  Ferrous Sulfate (IRON PO) Take 65 mg by mouth.    [provider]  furosemide (LASIX) 20 MG tablet Take 1 tablet (20 mg total) by mouth daily as needed. 02/15/22   Leonie Man, MD  glucose blood [facility] VERIO) test strip 1 each by  Other route as needed for other. Use as instructed 06/01/22   Denita Lung, MD  Multiple Vitamins-Minerals (MULTIVITAMIN WITH MINERALS) tablet Take 1 tablet by mouth daily.    [provider]  pantoprazole (PROTONIX) 40 MG tablet Take 1 tablet by mouth once daily 06/22/21   Leonie Man, MD  ranolazine (RANEXA) 500 MG 12 hr tablet Take 1 tablet by mouth twice daily 10/12/21   Leonie Man, MD  sildenafil (VIAGRA) 100 MG tablet TAKE ONE TABLET BY MOUTH DAILY AS NEEDED FOR ERECTILE DYSFUNCTION 10/27/21   Denita Lung, MD  valsartan-hydrochlorothiazide (DIOVAN-HCT) 320-12.5 MG tablet Take 1 tablet by mouth once daily 03/09/22   Denita Lung, MD     Family History  Problem Relation Age of Onset   Arthritis Mother    Diabetes Father    Heart disease Father    Stroke Sister    Hypertension Sister    Heart disease Sister    Diabetes Sister    Breast cancer Sister    Heart disease Brother    Ulcers Brother    Colon cancer Neg Hx     Social History   Socioeconomic History   Marital status: Married    Spouse name: Not on file   Number of children: Not on file   Years of education: Not on file   Highest education level: Not on file  Occupational History   Not on file  Tobacco Use   Smoking status: Former    Packs/day: 3.00    Years: 45.00    Total pack years: 135.00    Types: Cigarettes    Quit date: 10/05/1983    Years since quitting: 38.7   Smokeless tobacco: Never  Vaping Use   Vaping Use: Never used  Substance and Sexual Activity   Alcohol use: Not Currently    Alcohol/week: 2.0 standard drinks of alcohol    Types: 2 Cans of beer per week    Comment: 2 or 3 beers per day and more on the weekend   Drug use: No   Sexual activity: Yes  Other Topics Concern   Not on file  Social History Narrative   He is married, father of two, grandfather to 39, great grandfather to two.    Not really getting much exercise now, do to his significant back and hip pain.     He does not smoke and only has an alcoholic beverage.    Social Determinants of Health   Financial Resource Strain: Not on file  Food Insecurity: Not on file  Transportation Needs: Not on file  Physical Activity: Not on file  Stress: Not on file  Social Connections: Not on file     Review of Systems  Review of Systems: A 12 point ROS discussed and pertinent positives are indicated in the HPI above.  All other systems are negative.  Advance Care Plan: The advanced care plan/surrogate decision maker was discussed at the time of visit and documented in the medical record.    Physical Exam No direct physical exam was performed (except for noted visual exam findings with Video Visits).    Vital Signs: There were no vitals taken for this visit.  Imaging: CT ABDOMEN PELVIS WO CONTRAST  Result Date: 06/17/2022 CLINICAL DATA:  Follow-up endoleak  EXAM: CT ABDOMEN AND PELVIS WITHOUT CONTRAST TECHNIQUE: Multidetector CT imaging of the abdomen and pelvis was performed following the standard protocol without IV contrast. RADIATION DOSE REDUCTION: This exam was performed according to the departmental dose-optimization program which includes automated exposure control, adjustment of the mA and/or kV according to patient size and/or use of iterative reconstruction technique. COMPARISON:  06/25/2021 FINDINGS: Lower chest: Chronic bibasilar scarring. Coronary artery atherosclerosis. Moderate-sized hiatal hernia. Hepatobiliary: No focal liver abnormality is seen. Status post cholecystectomy. No biliary dilatation. Pancreas: Unremarkable. No pancreatic ductal dilatation or surrounding inflammatory changes. Spleen: Normal in size without focal abnormality. Adrenals/Urinary Tract: Stable, benign, fat-containing left adrenal myelolipoma. Normal right adrenal gland. Unchanged asymmetric right renal atrophy. No renal stone or hydronephrosis. Urinary bladder within normal limits. Stomach/Bowel: Moderate hiatal  hernia. Stomach is otherwise within normal limits. Appendix appears normal. Sigmoid diverticulosis. No evidence of bowel wall thickening, distention, or inflammatory changes. Vascular/Lymphatic: Postsurgical changes from aorto bi-iliac stent endograft repair of a infrarenal abdominal aortic aneurysm with prior coiling of the inferior mesenteric artery and a right lumbar branch artery. Continued enlargement of the excluded aneurysm sac, now measuring 7.0 x 5.7 cm (series 2, image 45), previously measured 6.6 x 5.5 cm on 06/25/2021. Aortic atherosclerosis. No abdominopelvic lymphadenopathy. Reproductive: Mildly enlarged prostate gland. Other: No free fluid. No abdominopelvic fluid collection. No pneumoperitoneum. No abdominal wall hernia. Musculoskeletal: Chronic degenerative and postoperative changes of the spine. No new or acute bony findings. IMPRESSION: 1. Postsurgical changes from aorto bi-iliac endograft repair of an infrarenal abdominal aortic aneurysm. Continued enlargement of the excluded aneurysm sac, now measuring 7.0 x 5.7 cm, previously measured 6.6 x 5.5 cm on 06/25/2021. Findings suggest ongoing endoleak. 2. Sigmoid diverticulosis. 3. Moderate-sized hiatal hernia. 4. Aortic and coronary artery atherosclerosis (ICD10-I70.0). Electronically Signed   By: Davina Poke D.O.   On: 06/17/2022 09:11    Labs:  CBC: Recent Labs    08/20/21 0427 08/21/21 0136 08/26/21 1208 02/15/22 1125  WBC 7.1 7.2 5.5 6.9  HGB 11.3* 10.7* 12.0* 11.8*  HCT 35.2* 32.3* 37.2* 35.6*  PLT 188 168 224 205    COAGS: Recent Labs    08/20/21 0427  INR 1.1    BMP: Recent Labs    08/19/21 1530 08/20/21 0427 08/21/21 0136 08/26/21 1208 02/15/22 1125  NA 140 137 135 137 140  K 4.2 3.8 3.7 4.6 5.2  CL 110 109 106 102 107*  CO2 21* 21* 23 20 19*  GLUCOSE 97 98 118* 119* 120*  BUN _0 34* 19  CALCIUM 8.8* 8.8* 8.6* 10.1 9.5  CREATININE 1.63* 1.40* 1.54* 2.09* 1.46*  GFRNONAA 42* 50* 44*  --   --      LIVER FUNCTION TESTS: Recent Labs    08/26/21 1208 02/15/22 1125  BILITOT 0.7 0.4  AST 24 27  ALT 19 18  ALKPHOS 46 40*  PROT 7.1 6.5  ALBUMIN 4.6 4.3    TUMOR MARKERS: No results for input(s): "AFPTM", "CEA", "CA199", "CHROMGRNA" in the last 8760 hours.  Assessment and Plan:   [name] is an 86 yo male with prior AAA repair with EVAR, SP embolization of both the IMA and lumbar arteries, and slowly enlarging excluded aneurysm sac, likely from persisting type II leak.    The change has been very slow over time, and he remains asymptomatic, with otherwise no concerning features of the CT.   Most recent CT shows estimated size of ~77m, with baseline of ~552m  Today, I  had another discussion with him regarding the reason for treatment of AAA, the known rate of increased intervention after endovascular repair, and the need for surveillance in these such cases of endoleak.    I reinforced the fact that our data support a very low risk of rupture in this scenario accepted to be less than 2% overall risk.     I did let him know that in this scenario where we are confident of ongoing type 2 leak contributing to sac enlargement, either endovascular or percutaneous treatment with possible embolization is recommended.  Even though the risk is low, there is a real risk of late sac rupture, which can be catastrophic. As at our last visit, he is not very motivated for repeat procedures/surgeries at this time. We should continue to observe him. He is agreeable to conservative approach with surveillance.     He knows that should he have any new abdominal pain or back pain, or any other concerning symptoms, he should come to [facility] ED for care.    Plan: - He understands our recommendation for endovascular and/or percutaneous treatment, however, he does not feel strongly about pursuing treatment at this time given low overall risk of late sac rupture.  We will schedule another follow up office  visit in 1 year, with repeat non-contrast CT scan abd/pelvis for surveillance.  - continue medical care  Electronically Signed: Corrie Mckusick 06/22/2022, 2:33 PM   I spent a total of    15 Minutes in remote  clinical consultation, greater than 50% of which was counseling/coordinating care for EVAR, ongoing type 2 endoleak, possible embolization.    Visit type: Audio only (telephone). Audio (no video) only due to patient's lack of internet/smartphone capability. Alternative for in-person consultation at [facility], [address], [place], [place]. This visit type was conducted due to national recommendations for restrictions regarding the COVID-19 Pandemic (e.g. social distancing).  This format is felt to be most appropriate for this patient at this time.  All issues noted in this document were discussed and addressed.

## 2022-07-08 ENCOUNTER — Telehealth: Payer: Self-pay

## 2022-07-08 NOTE — Telephone Encounter (Signed)
Preoperative team, patient will not be eligible for holding antiplatelet therapy until after 08/20/2022 due to his stent placement and cardiac history.  He will need to resubmit his request for lumbar spine injection after 08/20/2022.  Thank you  [name]. Kathyjo Briere NP-C     07/08/2022, 11:11 AM [address] Office [phone] Fax [phone]

## 2022-07-08 NOTE — Telephone Encounter (Signed)
   Pre-operative Risk Assessment    Patient Name: [name]  DOB: [birth year] MRN: [id number]      Request for Surgical Clearance    Procedure:   LUMBAR SPINE INJECTION  Date of Surgery:  Clearance TBD                                 Surgeon:  DR. DAVE [facility] Surgeon's Group or Practice Name:  [place] Phone number:  [phone] Fax number:  [phone]   Type of Clearance Requested:   - Pharmacy:  Hold Clopidogrel (Plavix) 7 DAYS   Type of Anesthesia:  Not Indicated   Additional requests/questions:    Signed, Zebedee Iba   07/08/2022, 9:30 AM

## 2022-07-08 NOTE — Telephone Encounter (Signed)
Will forward to requesting office, see notes from Coletta Memos, [place]

## 2022-07-18 ENCOUNTER — Other Ambulatory Visit: Payer: Self-pay | Admitting: Family Medicine

## 2022-07-18 DIAGNOSIS — I110 Hypertensive heart disease with heart failure: Secondary | ICD-10-CM

## 2022-07-22 ENCOUNTER — Other Ambulatory Visit: Payer: Self-pay | Admitting: Family Medicine

## 2022-07-22 DIAGNOSIS — I110 Hypertensive heart disease with heart failure: Secondary | ICD-10-CM

## 2022-08-10 ENCOUNTER — Encounter: Payer: Self-pay | Admitting: Family Medicine

## 2022-08-10 ENCOUNTER — Ambulatory Visit (INDEPENDENT_AMBULATORY_CARE_PROVIDER_SITE_OTHER): Payer: Medicare Other | Admitting: Family Medicine

## 2022-08-10 VITALS — BP 128/64 | HR 68 | Temp 97.8°F | Ht 69.0 in | Wt 204.2 lb

## 2022-08-10 DIAGNOSIS — R319 Hematuria, unspecified: Secondary | ICD-10-CM

## 2022-08-10 DIAGNOSIS — N3091 Cystitis, unspecified with hematuria: Secondary | ICD-10-CM | POA: Diagnosis not present

## 2022-08-10 DIAGNOSIS — R350 Frequency of micturition: Secondary | ICD-10-CM | POA: Diagnosis not present

## 2022-08-10 DIAGNOSIS — N3941 Urge incontinence: Secondary | ICD-10-CM

## 2022-08-10 DIAGNOSIS — Z23 Encounter for immunization: Secondary | ICD-10-CM

## 2022-08-10 LAB — POCT URINALYSIS DIP (PROADVANTAGE DEVICE)
Glucose, UA: NEGATIVE mg/dL
Ketones, POC UA: NEGATIVE mg/dL
Nitrite, UA: POSITIVE — AB
Protein Ur, POC: 100 mg/dL — AB
Specific Gravity, Urine: 1.025
Urobilinogen, Ur: 0.2
pH, UA: 6.5 (ref 5.0–8.0)

## 2022-08-10 MED ORDER — SULFAMETHOXAZOLE-TRIMETHOPRIM 800-160 MG PO TABS: 1.0000 | ORAL_TABLET | Freq: Two times a day (BID) | ORAL | 0 refills | Status: DC

## 2022-08-10 NOTE — Progress Notes (Signed)
   Subjective:    Patient ID: [name], male    DOB: [birth year], 86 y.o.   MRN: [id number]  HPI He states that last night he noted some blood staining in his underwear and this morning when he woke up he saw blood in his urine.  No dysuria, fever, abdominal pain, nausea or vomiting.  He has a several year history of urge incontinence and he does use depends and works to always try to keep his bladder empty.   Review of Systems     Objective:   Physical Exam Alert and in no distress.  Urine dipstick showed positive for nitrite, leuks and blood.       Assessment & Plan:  Hemorrhagic cystitis - Plan: sulfamethoxazole-trimethoprim (BACTRIM DS) 800-160 MG tablet, Urine Culture  Urinary frequency - Plan: POCT Urinalysis DIP (Proadvantage Device)  Hematuria, unspecified type - Plan: POCT Urinalysis DIP (Proadvantage Device)  Need for influenza vaccination - Plan: Flu Vaccine QUAD High Dose(Fluad)  Need for COVID-19 vaccine - Plan: Pfizer Fall 2023 Covid-19 Vaccine 77yr and older  Urge incontinence Discussed urge incontinence with him and at this point he is not interested in being placed on any medications and plans to continue to use Depends.  He is to return here in 2 weeks for recheck on his urine.  His immunizations were updated.

## 2022-08-12 LAB — URINE CULTURE

## 2022-08-24 ENCOUNTER — Ambulatory Visit (INDEPENDENT_AMBULATORY_CARE_PROVIDER_SITE_OTHER): Payer: Medicare Other | Admitting: Family Medicine

## 2022-08-24 ENCOUNTER — Telehealth: Payer: Self-pay | Admitting: *Deleted

## 2022-08-24 ENCOUNTER — Encounter: Payer: Self-pay | Admitting: Family Medicine

## 2022-08-24 VITALS — BP 120/70 | HR 68 | Ht 69.0 in | Wt 201.4 lb

## 2022-08-24 DIAGNOSIS — M545 Low back pain, unspecified: Secondary | ICD-10-CM | POA: Diagnosis not present

## 2022-08-24 DIAGNOSIS — R319 Hematuria, unspecified: Secondary | ICD-10-CM | POA: Diagnosis not present

## 2022-08-24 DIAGNOSIS — R296 Repeated falls: Secondary | ICD-10-CM

## 2022-08-24 DIAGNOSIS — R300 Dysuria: Secondary | ICD-10-CM | POA: Diagnosis not present

## 2022-08-24 LAB — POCT URINALYSIS DIP (PROADVANTAGE DEVICE)
Bilirubin, UA: NEGATIVE
Blood, UA: NEGATIVE
Glucose, UA: NEGATIVE mg/dL
Ketones, POC UA: NEGATIVE mg/dL
Leukocytes, UA: NEGATIVE
Nitrite, UA: NEGATIVE
Protein Ur, POC: NEGATIVE mg/dL
Specific Gravity, Urine: 1.015
Urobilinogen, Ur: 0.2
pH, UA: 6 (ref 5.0–8.0)

## 2022-08-24 NOTE — Telephone Encounter (Signed)
Dr. Sherley Bounds [facility]) sent patient to [facility] [phone]). He was seen one time 06/02/22 by Dr Rudene Anda.

## 2022-08-24 NOTE — Progress Notes (Signed)
   Subjective:    Patient ID: [name], male    DOB: May 03, 1936, 86 y.o.   MRN: [id number]  HPI He is here for recheck on his hematuria.  He is having no symptoms.  He does relate the fact that he has fallen several times in the last week or so.  He has a history of back pain and apparently has back surgery pending.  He has been sent to physical therapy and was doing some rehab at home.  He does have a walker but has been using a cane which she states keeps him from falling if he uses it.   Review of Systems     Objective:   Physical Exam Alert and in no distress.  Urine dipstick was negative.       Assessment & Plan:  Hematuria, unspecified type  Dysuria - Plan: POCT Urinalysis DIP (Proadvantage Device)  Multiple falls  Low back pain, unspecified back pain laterality, unspecified chronicity, unspecified whether sciatica present I expressed my concern over the fact that he has fallen several times and want to further assess his functional capacities with physical therapy.  We will find out who he was sent to and have them reevaluate his functional capacity before he has surgery. His hematuria recurs, I will refer to urology.  He was comfortable with that.

## 2022-09-01 ENCOUNTER — Other Ambulatory Visit: Payer: Self-pay | Admitting: Cardiology

## 2022-09-08 ENCOUNTER — Telehealth: Payer: Self-pay

## 2022-09-08 NOTE — Telephone Encounter (Signed)
   Name: [name]  DOB: 1936-05-09  MRN: [id number]   Primary Cardiologist: Glenetta Hew, MD  Chart reviewed as part of pre-operative protocol coverage.  Spoke with patient's wife (DPR on file) via telephone who states that the below procedure has been canceled. She states patient is pending possible back surgery. I do not see a clearance request for back surgery at this time.  Patient's wife states she would contact Dr. Adah Salvage office and if necessary would have his office send [place] a separate clearance request.  I will route this recommendation to the requesting party via [facility] fax function and remove from pre-op pool. Please call with questions.  Lenna Sciara, NP 09/08/2022, 3:25 PM

## 2022-09-08 NOTE — Telephone Encounter (Signed)
   Pre-operative Risk Assessment    Patient Name: [name]  DOB: 1936-09-02 MRN: [id number]      Request for Surgical Clearance    Procedure:   Lumbar Spine Injection  Date of Surgery:  Clearance 09/06/22                                 Surgeon:  Dr. Lenord Carbo Surgeon's Group or Practice Name:  [facility] Phone number:  [phone] Fax number:  [phone]   Type of Clearance Requested:   - Pharmacy:  Hold Clopidogrel (Plavix) 7   Type of Anesthesia:  Not Indicated   Additional requests/questions:    Signed, Wonda Horner   09/08/2022, 2:55 PM

## 2022-09-20 ENCOUNTER — Other Ambulatory Visit: Payer: Self-pay | Admitting: Cardiology

## 2022-09-20 DIAGNOSIS — I110 Hypertensive heart disease with heart failure: Secondary | ICD-10-CM

## 2022-10-11 ENCOUNTER — Telehealth: Payer: Self-pay | Admitting: Cardiology

## 2022-10-11 NOTE — Telephone Encounter (Signed)
PCI was  in Nov 22 -- > 1 yr out.  OK to hold Plavix for procedure or syrgery.  [place]

## 2022-10-11 NOTE — Telephone Encounter (Signed)
I tried to call the requesting office back but no one picked. I will send over notes from previous as we were under the understanding procedure was cancelled. See notes, not sure if Dr. Ronnald Ramp is in the same office as Dr. Davy Pique.

## 2022-10-11 NOTE — Telephone Encounter (Signed)
Caller stated they did not receive the patient's clearance and would like the informtion re-faxed to fax# [phone].

## 2022-10-11 NOTE — Telephone Encounter (Signed)
   Name: [name]  DOB: [birth year]  MRN: [id number]  Primary Cardiologist: Glenetta Hew, MD  Chart reviewed as part of pre-operative protocol coverage. Because of [name]'s past medical history and time since last visit, he will require a follow-up telephone visit in order to better assess preoperative cardiovascular risk.  Pre-op covering staff: - Please schedule appointment and call patient to inform them. If patient already had an upcoming appointment within acceptable timeframe, please add "pre-op clearance" to the appointment notes so provider is aware. - Please contact requesting surgeon's office via preferred method (i.e, phone, fax) to inform them of need for appointment prior to surgery.  Guidance requested from Dr. Ellyn Hack regarding Plavix hold since it is longer than usual (7 days) and that patient has history of overlapping stents.  [name], PA-C  10/11/2022, 11:40 AM

## 2022-10-11 NOTE — Telephone Encounter (Signed)
Joneen Roach N19 minutes ago (10:26 AM)   Charna Archer Wyatt Portela is returning Aki Burdin's call. She states she did receive notes faxed today, but believes there is some confusion. The spinal surgery was cancelled, but they are now wanting to rescheduled the injection and our needing clearance on holding the plavix for 7 days as previously requested. Please advise.       Note   [place]  Joneen Roach N26 minutes ago (10:19 AM)   ext [phone]  Incoming call  I will forward this back to pre op for review.

## 2022-10-11 NOTE — Telephone Encounter (Signed)
PCI was  in Nov 22 -- > 1 yr out.  OK to hold Plavix for procedure or syrgery.   [place]

## 2022-10-11 NOTE — Telephone Encounter (Signed)
I s/w the pt and his wife and set up tele pre op appt 10/15/22 @ 2:20. Med rec and consent are done. In my conversation, the pt's wife states that the pt is NOT going to have the spine injections with Dr. Davy Pique; states the pt wants to proceed with the back surgery that was d/w Dr. Ronnald Ramp with [facility]. I informed her that is sounds like there is confusion between them and the surgeon office. I informed the pt and his wife that they need to reach out to [place] and let them know of over conversation. I assured them that I will fax my notes from today over as well as FYI.  I informed the pt and his wife that it sounds like they may need to s/w Dr. Ronnald Ramp again and let him know that he wants to proceed with the back surgery and NOT the injections. Pt states his injections were cancelled multiple times over the summer; and now the pt feels that the injections are not the way for him to go, that he feels they will not help. I did enforce that we will need new updated notes with which procedure to be done, which per the pt we should expect clearance request from Dr. Ronnald Ramp for the back surgery.   We did schedule a tele visit 10/15/22 in preparation that we may new notes by then.

## 2022-10-11 NOTE — Telephone Encounter (Signed)
[name] is returning Carol's call. She states she did receive notes faxed today, but believes there is some confusion. The spinal surgery was cancelled, but they are now wanting to rescheduled the injection and our needing clearance on holding the plavix for 7 days as previously requested. Please advise.

## 2022-10-12 ENCOUNTER — Telehealth: Payer: Self-pay

## 2022-10-12 NOTE — Telephone Encounter (Signed)
   Pre-operative Risk Assessment    Patient Name: [name]  DOB: 31-May-1936 MRN: [id number]     Request for Surgical Clearance    Procedure:   LUMBAR SPINE TRANSFORAMINAL L2-L3 BILATERAL  Date of Surgery:  Clearance TBD                                 Surgeon:  DR Lenord Carbo Surgeon's Group or Practice Name:  [facility] Phone number:  [phone] Fax number:  [phone]   Type of Clearance Requested:   - Medical -DISCONTINUE PLAVIX 7 DAYS PRIOR AND RESUME THE DAY AFTER   Type of Anesthesia:  Not Indicated   Additional requests/questions:  Please advise surgeon/provider what medications should be held. Please fax a copy of CARDIAC CLEARANCE to the surgeon's office.  Signed, Jeanmarie Plant Everley Evora  CCMA 10/12/2022, 2:27 PM

## 2022-10-14 NOTE — Progress Notes (Signed)
Virtual Visit via Telephone Note   Because of [name]'s co-morbid illnesses, he is at least at moderate risk for complications without adequate follow up.  This format is felt to be most appropriate for this patient at this time.  The patient did not have access to video technology/had technical difficulties with video requiring transitioning to audio format only (telephone).  All issues noted in this document were discussed and addressed.  No physical exam could be performed with this format.  Please refer to the patient's chart for his consent to telehealth for [facility].  Evaluation Performed:  Preoperative cardiovascular risk assessment _____________   Date:  10/15/2022   Patient ID:  [name], DOB [birth year], MRN [id number] Patient Location:  Home Provider location:   Office  Primary Care Provider:  Denita Lung, MD Primary Cardiologist:  Glenetta Hew, MD  Chief Complaint / Patient Profile   87 y.o. y/o male with a h/o CAD s/p CABG 1995, multiple PCI with most recent catheterization 08/21/2021 with successful PCI/DES of distal circumflex obtuse marginal branch stenosis involving distal edge of previously placed stent, HTN, chronic HFpEF, HLD with statin myopathy, AAA s/p EVAR, PAD, CKD, difficulty with balance  who is pending lumbar spine transforaminal L2-L3 bilateral and presents today for telephonic preoperative cardiovascular risk assessment.  History of Present Illness    [name] is a 87 y.o. male who presents via audio/video conferencing for a telehealth visit today.  Pt was last seen in cardiology clinic on 02/15/22 by Dr. Ellyn Hack.  At that time [name] was stable. He had a follow-up telehealth visit one month later on 03/26/22. He was advised he could follow-up in 6-7 months. The patient is now pending procedure as outlined above. Since his last visit, he  denies chest pain, shortness of breath, lower extremity edema, fatigue,  palpitations, melena, hematuria, hemoptysis, diaphoresis, weakness, presyncope, syncope, orthopnea, and PND. Has constant back pain but he is doing what he has to do around the house with occasional rest breaks. No cardiac symptoms to report.   Past Medical History    Past Medical History:  Diagnosis Date   Adenomatous colon polyp    Ankle edema    Chronic   Asthma    CAD S/P percutaneous coronary angioplasty 2004   a) '04: Staged Taxus DES PCI to RCA (2 Taxus 2.75 x 32 & 12) and Cx-OM (Taxus 3 x 20);;'07 - PCI to pRCA ISR- Cypher DES; c) 05/2014: pPCI pRCA stentISR (Promus DES 3 x 8) OM1 distal to stent (Promus DES 2.75 x 12 - 3.1); 11/'22: distal LCx stent edge 99% -> Onyx Frontier DES 2.5 x 15 - 2.8 (overlaps prior stent)   CAD, multiple vessel 1985   Most recent July 2018: Admitted for non-STEMI. Occluded LAD with patent LIMA (SP1 now occluded).  Patent stents in proximal and mid RCA as well as circumflex-OM 3. Known occlusion of SVG-OM. EF 45-50%   CHF (congestive heart failure) ([place])    Cholelithiasis - with cholangitis & choledocholithiasis    status post ERCP with removal of calculi and biliary stent placement.   Chronic anemia    On iron supplement; history of positive guaiac - negative colonoscopy in 1996.; Thought to be related to hemorrhoids; status post hemorrhoidectomy   Chronic back pain     multiple surgeries; C-spine and lumbar   Diabetes mellitus    on oral medication   Diverticulitis of colon 1996   Diverticulosis  Dyslipidemia, goal LDL below 70    Erectile dysfunction    Exertional dyspnea    Chronic baseline SOB with ambulation   GERD (gastroesophageal reflux disease)    Grade II diastolic dysfunction    H/O: pneumonia 11/2012   Hemorrhoids    Hiatal hernia    History of: ST elevation myocardial infarction (STEMI) involving left circumflex coronary artery with complication 3846   PTCA-circumflex; PCI in 1991   Hypertension    Moderate aortic stenosis by  prior echocardiogram 07/2015   Normal LV function - EF 55-60%.. Abnormal relaxation. Mild-moderate aortic stenosis (peak/mean gradient 18/10 mmH)   Osteoarthritis of both knees    And back; multiple back surgeries, right knee arthroplasty and left knee arthroscopic surgery x2   Pneumonia 2016   S/P AAA (abdominal aortic aneurysm) repair 08/30/2012   s/p EVAR   S/P CABG x 2 1997   LIMA-LAD, SVG-OM   Sleep apnea    pt. states he was told to return for f/u, to be fitted for Cpap, but pt. reports that he didn't follow up-no cpap used   Statin myopathy 09/30/2015   Past Surgical History:  Procedure Laterality Date   Abdominal and Lower Extremity Arterial Ultrasound  08/23/2012; 10/10/2013   Normal ABIs. Nonocclusive lower extremity disease. 4.2 cm x 4.3 cm infrarenal AAA;; 4.4 cm x 4.3 cm (essentially stable)    ABDOMINAL AORTIC ENDOVASCULAR STENT GRAFT N/A 08/13/2015   Procedure: ABDOMINAL AORTIC ENDOVASCULAR STENT GRAFT;  Surgeon: Mal Misty, MD;  Location: [facility] OR;  Service: Vascular;  Laterality: N/A;   Anterior cervical plating  04/23/2010   At C4-5 and a C6-7 utilizing two separate Biomet MaxAn plates.   ANTERIOR LAT LUMBAR FUSION Left 11/22/2012   Procedure: ANTERIOR LATERAL LUMBAR FUSION 1 LEVEL;  Surgeon: Eustace Moore, MD;  Location: [place] NEURO ORS;  Service: Neurosurgery;  Laterality: Left;  Anterior Lateral Lumbar Fusion Lumbar Three-Four   BACK SURGERY  1979 & x 10   pt. remarks, "I have had about 10 back surgeries"   BILIARY STENT PLACEMENT N/A 07/03/2014   Procedure: BILIARY STENT PLACEMENT;  Surgeon: Inda Castle, MD;  Location: [place];  Service: Endoscopy;  Laterality: N/A;   CARPAL TUNNEL RELEASE Left 11/16/2019   Procedure: LEFT CARPAL TUNNEL RELEASE;  Surgeon: Mcarthur Rossetti, MD;  Location: [facility] ORS;  Service: Orthopedics;  Laterality: Left;   CATARACT EXTRACTION     Cervical arthrodesis  04/23/2010   Anterior cervical arthrodesis, C4-5, C6-7 utilizing  7-mm PEEK interbody cage packed with local autograft & Antifuse putty at C4-5 & an 8-mm cage at C6-7.   CERVICAL DISCECTOMY  04/23/2010   Decompressive anterior carvical diskectomy. C4-5, C6-7   CHOLECYSTECTOMY N/A 05/23/2015   Procedure: LAPAROSCOPIC CHOLECYSTECTOMY WITH INTRAOPERATIVE CHOLANGIOGRAM;  Surgeon: Alphonsa Overall, MD;  Location: [facility] ORS;  Service: General;  Laterality: N/A;   [place]  1985,1991,15   1985 lateral STEMI Circumflex PTCA;    CORONARY ARTERY BYPASS GRAFT  1997   LIMA-LAD, SVG-OM (SVG known to be occluded prior to 2004)   CORONARY STENT INTERVENTION N/A 08/20/2021   Procedure: CORONARY STENT INTERVENTION;  Surgeon: Lorretta Harp, MD;  Location: [place] CV LAB;  Service: Cardiovascular:  dLCx distal Edge 99% => Onyx Frontier DES 2.5 x 15 (2.8 mm) overlaps prior stent distally.   ERCP N/A 07/03/2014   Procedure: ENDOSCOPIC RETROGRADE CHOLANGIOPANCREATOGRAPHY (ERCP);  Surgeon: Inda Castle, MD;  Location: [place];  Service: Endoscopy;  Laterality: N/A;   ERCP N/A 07/05/2014   Procedure: ENDOSCOPIC RETROGRADE CHOLANGIOPANCREATOGRAPHY (ERCP);  Surgeon: Inda Castle, MD;  Location: [place];  Service: Endoscopy;  Laterality: N/A;   ERCP N/A 06/30/2015   Procedure: ENDOSCOPIC RETROGRADE CHOLANGIOPANCREATOGRAPHY (ERCP);  Surgeon: Ladene Artist, MD;  Location: [facility];  Service: Endoscopy;  Laterality: N/A;   EYE SURGERY     IR ANGIOGRAM EXTREMITY LEFT  01/18/2017   IR ANGIOGRAM PELVIS SELECTIVE OR SUPRASELECTIVE  01/18/2017   IR ANGIOGRAM SELECTIVE EACH ADDITIONAL VESSEL  01/18/2017   IR AORTAGRAM ABDOMINAL SERIALOGRAM  01/18/2017   IR EMBO ARTERIAL NOT HEMORR HEMANG INC GUIDE ROADMAPPING  01/18/2017   IR RADIOLOGIST EVAL & MGMT  01/04/2017   IR RADIOLOGIST EVAL & MGMT  10/26/2017   IR RADIOLOGIST EVAL & MGMT  05/24/2018   IR RADIOLOGIST EVAL & MGMT  06/19/2019   IR RADIOLOGIST EVAL & MGMT  06/11/2020   IR  RADIOLOGIST EVAL & MGMT  07/02/2021   IR RADIOLOGIST EVAL & MGMT  06/22/2022   IR US GUIDE VASC ACCESS RIGHT  01/18/2017   KNEE ARTHROSCOPY Left    x 2   LEFT HEART CATH AND CORS/GRAFTS ANGIOGRAPHY  06/2003   None Occluded vein graft to OM; diffuse RCA disease in the mid vessel, 80% circumflex-OM stenosis; follow on AV groove circumflex with sequential 90% stenoses and intervening saccular dilation    LEFT HEART CATH AND CORS/GRAFTS ANGIOGRAPHY  12/2008   4 abnormal Myoview showing apical thinning (possibly due to apical LAD 95%) : 100% Occluded LAD after SV1, distal LAD grafted via LIMA -apical 95% . Cx -OM1 w/patent stent extending into OM 1 . Follow on Cx - 70-80% - non-amenable PCI. RCA widely patent 3 overlapping stents in mRCA w/less than 40% stenosisin RPL; SVG-OM known occluded    LEFT HEART CATH AND CORS/GRAFTS ANGIOGRAPHY N/A 04/20/2017   Procedure: Left Heart Cath and Cors/Grafts Angiography;  Surgeon: Lorretta Harp, MD;  Location: [facility] INVASIVE CV LAB;  LAD now occluded prior to SP1.LIMA-LAD. Patent RCA and circumflex stents. EF 45-50%. Relatively stable.    LEFT HEART CATH AND CORS/GRAFTS ANGIOGRAPHY N/A 08/20/2021   Procedure: LEFT HEART CATH AND CORS/GRAFTS ANGIOGRAPHY;  Surgeon: Lorretta Harp, MD;  Location: [place] CV LAB;  Service: Cardiovascular: CULPRIT 99% dLCx just after stent (Overlapping DES PCI) - otw patent LCx stent, patent RCA stent & Patent LIMA-LAD with CTO of prox LAD.   LEFT HEART CATHETERIZATION WITH CORONARY ANGIOGRAM N/A 05/15/2014   Procedure: LEFT HEART CATHETERIZATION WITH CORONARY ANGIOGRAM;  Surgeon: Sinclair Grooms, MD;  Location: [facility] CATH LAB;  Service: Cardiovascular;  Laterality: N/A;   LEFT HEART CATHETERIZATION WITH CORONARY/GRAFT ANGIOGRAM N/A 06/28/2014   Procedure: LEFT HEART CATHETERIZATION WITH Beatrix Fetters;  Surgeon: Troy Sine, MD;  Location: [facility] CATH LAB;  Service: Cardiovascular;  Laterality: N/A;   LUMBAR PERCUTANEOUS  PEDICLE SCREW 1 LEVEL N/A 11/22/2012   Procedure: LUMBAR PERCUTANEOUS PEDICLE SCREW 1 LEVEL;  Surgeon: Eustace Moore, MD;  Location: [place] NEURO ORS;  Service: Neurosurgery;  Laterality: N/A;  Lumbar Three-Four Percutaneous Pedicle Screw, Lateral approach   MINOR HEMORRHOIDECTOMY     NM MYOVIEW LTD  05/26/2021   Lexiscan: Intermediate risk (similar findings to last study), with exception of reduced EF roughly 40%..  Prior basal inferior-inferolateral infarct without peri-infarct ischemia.   ->  Echo ordered -> (EF 45 to 50%)-when compared to prior Myoview, infarct appears similar.   PERCUTANEOUS CORONARY STENT INTERVENTION (PCI-S)  06/2003   PCI - RCA 2 overlapping Taxus DES 2.75 mm x 32 mm and 2.75 mm x 12 mm (3.0 mm); PCI-Cx-OM1 - Taxus DES 3.0 mm x 20 mm (3.1 mm);    PERCUTANEOUS CORONARY STENT INTERVENTION (PCI-S)  12/2005   80% ISR in proximal Taxus stent in RCA -- covered proximally with Cypher DES 3.0 mm x 12 mm   PERCUTANEOUS CORONARY STENT INTERVENTION (PCI-S) N/A 05/17/2014   Procedure: PERCUTANEOUS CORONARY STENT INTERVENTION (PCI-S);  Surgeon: Sinclair Grooms, MD;  Location: [facility] CATH LAB: PCI pRCA stent ISR - Promus DES 3.0 x 8 (3.25), OM distal stent edge - Promus DES 2.75 x 12 (3.1)   PERIPHERAL VASCULAR CATHETERIZATION  11/03/2016   Procedure: Embolization;  Surgeon: Waynetta Sandy, MD;  Location: [place] CV LAB;  Service: Cardiovascular;;   POSTERIOR CERVICAL FUSION/FORAMINOTOMY N/A 05/02/2013   Procedure: POSTERIOR CERVICAL FUSION/FORAMINOTOMY CERVICAL SEVEN THORACIC-ONE;  Surgeon: Eustace Moore, MD;  Location: [place] NEURO ORS;  Service: Neurosurgery;  Laterality: N/A;  POSTERIOR CERVICAL FUSION/FORAMINOTOMY CERVICAL SEVEN THORACIC-ONE   SPHINCTEROTOMY N/A 06/30/2015   Procedure: SPHINCTEROTOMY;  Surgeon: Ladene Artist, MD;  Location: [facility] ENDOSCOPY;  Service: Endoscopy;  Laterality: N/A;   TOTAL KNEE ARTHROPLASTY Right    TOTAL KNEE ARTHROPLASTY Left 12/20/2016    Procedure: LEFT TOTAL KNEE ARTHROPLASTY;  Surgeon: Vickey Huger, MD;  Location: [place];  Service: Orthopedics;  Laterality: Left;   TRANSTHORACIC ECHOCARDIOGRAM  06/10/2021   EF 45 to 50%.  Mildly reduced function.  Mild hypokinesis of basal-mid inferior and inferolateral wall consistent with prior infarct.  Severe LA dilation.  Mild to moderate MR with moderate MAC.  Mild calcific AS (mean gradient 11 mmHg) --> LV function seems unchanged.  Diastolic function improved.  Wall motion abnormality unchanged.  AS unchanged.   TRANSTHORACIC ECHOCARDIOGRAM  07/2018   Normal LV size.  EF 50 to 55% with mild HK of inferolateral wall.  GRII DD.  Mild AS with mean gradient 15 mm or greater.  Mild ascending aortic dilation.  Mildly increased PA pressures of 41 mmHg   UPPER GI ENDOSCOPY  07/03/2019   VISCERAL ANGIOGRAM  11/03/2016   Procedure: Visceral Angiogram;  Surgeon: Waynetta Sandy, MD;  Location: [place] CV LAB;  Service: Cardiovascular;;    Allergies  Allergies  Allergen Reactions   Oxycodone Shortness Of Breath and Cough   Lisinopril Cough   Statins Other (See Comments)    MYALGIAS    Welchol [Colesevelam Hcl] Itching    Home Medications    Prior to Admission medications   Medication Sig Start Date End Date Taking? Authorizing Provider  acetaminophen (TYLENOL) 500 MG tablet Take 1,000 mg by mouth every 6 (six) hours as needed for mild pain, moderate pain or headache. Reported on 03/16/2016    [provider]  amLODipine (NORVASC) 5 MG tablet Take 1 tablet by mouth once daily 09/22/22   Leonie Man, MD  aspirin 81 MG chewable tablet Chew 1 tablet (81 mg total) by mouth daily. 08/22/21   Hongalgi, Lenis Dickinson, MD  Bempedoic Acid (NEXLETOL) 180 MG TABS Take 180 mg by mouth daily. 03/18/22   Leonie Man, MD  Choline Fenofibrate (FENOFIBRIC ACID) 135 MG CPDR TAKE 1 CAPSULE BY MOUTH EVERY DAY 04/08/22   Leonie Man, MD  clopidogrel (PLAVIX) 75 MG tablet Take 1 tablet  (75 mg total) by mouth daily. 02/15/22   Leonie Man, MD  ezetimibe (ZETIA) 10 MG tablet Take 1  tablet by mouth once daily 08/17/21   Leonie Man, MD  Ferrous Sulfate (IRON PO) Take 65 mg by mouth.    [provider]  furosemide (LASIX) 20 MG tablet Take 1 tablet (20 mg total) by mouth daily as needed. 02/15/22   Leonie Man, MD  glucose blood [facility] VERIO) test strip 1 each by Other route as needed for other. Use as instructed 06/01/22   Denita Lung, MD  Multiple Vitamins-Minerals (MULTIVITAMIN WITH MINERALS) tablet Take 1 tablet by mouth daily.    [provider]  pantoprazole (PROTONIX) 40 MG tablet Take 1 tablet by mouth once daily 09/01/22   Leonie Man, MD  ranolazine (RANEXA) 500 MG 12 hr tablet Take 1 tablet by mouth twice daily 10/12/21   Leonie Man, MD  sildenafil (VIAGRA) 100 MG tablet TAKE ONE TABLET BY MOUTH DAILY AS NEEDED FOR ERECTILE DYSFUNCTION Patient not taking: Reported on 08/10/2022 10/27/21   Denita Lung, MD  sulfamethoxazole-trimethoprim (BACTRIM DS) 800-160 MG tablet Take 1 tablet by mouth 2 (two) times daily. Patient not taking: Reported on 08/24/2022 08/10/22   Denita Lung, MD  valsartan-hydrochlorothiazide [facility]) 320-12.5 MG tablet Take 1 tablet by mouth once daily 07/19/22   Denita Lung, MD    Physical Exam    Vital Signs:  [name] does not have vital signs available for review today.  Given telephonic nature of communication, physical exam is limited. AAOx3. NAD. Normal affect.  Speech and respirations are unlabored.  Accessory Clinical Findings    None  Assessment & Plan    1.  Preoperative Cardiovascular Risk Assessment: The patient is doing well from a cardiac perspective. Therefore, based on ACC/AHA guidelines, the patient would be at acceptable risk for the planned procedure without further cardiovascular testing. According to the Revised Cardiac Risk Index (RCRI), his Perioperative Risk  of Major Cardiac Event is (%): 6.6 His Functional Capacity in METs is: 5.62 according to the Duke Activity Status Index (DASI).  The patient was advised that if he develops new symptoms prior to surgery to contact our office to arrange for a follow-up visit, and he verbalized understanding.  Per Dr. Ellyn Hack, he may hold Plavix for 7 days and resume the day after the procedure.   A copy of this note will be routed to requesting surgeon.  Time:   Today, I have spent 10 minutes with the patient with telehealth technology discussing medical history, symptoms, and management plan.     Emmaline Life, NP-C  10/15/2022, 2:21 PM [address] Office [phone] Fax [phone]

## 2022-10-15 ENCOUNTER — Ambulatory Visit: Payer: Medicare Other | Attending: Cardiovascular Disease | Admitting: Nurse Practitioner

## 2022-10-15 ENCOUNTER — Encounter: Payer: Self-pay | Admitting: Nurse Practitioner

## 2022-10-15 DIAGNOSIS — Z01810 Encounter for preprocedural cardiovascular examination: Secondary | ICD-10-CM

## 2022-10-26 ENCOUNTER — Other Ambulatory Visit: Payer: Self-pay | Admitting: Cardiology

## 2022-11-02 ENCOUNTER — Ambulatory Visit (INDEPENDENT_AMBULATORY_CARE_PROVIDER_SITE_OTHER): Payer: Medicare Other | Admitting: Family Medicine

## 2022-11-02 ENCOUNTER — Encounter: Payer: Self-pay | Admitting: Family Medicine

## 2022-11-02 VITALS — BP 164/72 | HR 65 | Temp 97.6°F | Resp 16 | Ht 69.5 in | Wt 203.0 lb

## 2022-11-02 DIAGNOSIS — Z79899 Other long term (current) drug therapy: Secondary | ICD-10-CM | POA: Diagnosis not present

## 2022-11-02 DIAGNOSIS — I719 Aortic aneurysm of unspecified site, without rupture: Secondary | ICD-10-CM | POA: Insufficient documentation

## 2022-11-02 DIAGNOSIS — E119 Type 2 diabetes mellitus without complications: Secondary | ICD-10-CM

## 2022-11-02 DIAGNOSIS — Z0000 Encounter for general adult medical examination without abnormal findings: Secondary | ICD-10-CM | POA: Diagnosis not present

## 2022-11-02 DIAGNOSIS — I110 Hypertensive heart disease with heart failure: Secondary | ICD-10-CM

## 2022-11-02 DIAGNOSIS — I5032 Chronic diastolic (congestive) heart failure: Secondary | ICD-10-CM | POA: Diagnosis not present

## 2022-11-02 DIAGNOSIS — E785 Hyperlipidemia, unspecified: Secondary | ICD-10-CM

## 2022-11-02 DIAGNOSIS — K219 Gastro-esophageal reflux disease without esophagitis: Secondary | ICD-10-CM

## 2022-11-02 DIAGNOSIS — I25119 Atherosclerotic heart disease of native coronary artery with unspecified angina pectoris: Secondary | ICD-10-CM | POA: Diagnosis not present

## 2022-11-02 DIAGNOSIS — N1832 Chronic kidney disease, stage 3b: Secondary | ICD-10-CM

## 2022-11-02 DIAGNOSIS — Z86010 Personal history of colonic polyps: Secondary | ICD-10-CM

## 2022-11-02 DIAGNOSIS — R143 Flatulence: Secondary | ICD-10-CM

## 2022-11-02 DIAGNOSIS — M545 Low back pain, unspecified: Secondary | ICD-10-CM | POA: Insufficient documentation

## 2022-11-02 DIAGNOSIS — E669 Obesity, unspecified: Secondary | ICD-10-CM

## 2022-11-02 DIAGNOSIS — Z96653 Presence of artificial knee joint, bilateral: Secondary | ICD-10-CM

## 2022-11-02 DIAGNOSIS — G720 Drug-induced myopathy: Secondary | ICD-10-CM

## 2022-11-02 DIAGNOSIS — R296 Repeated falls: Secondary | ICD-10-CM

## 2022-11-02 DIAGNOSIS — T466X5A Adverse effect of antihyperlipidemic and antiarteriosclerotic drugs, initial encounter: Secondary | ICD-10-CM

## 2022-11-02 LAB — POCT GLYCOSYLATED HEMOGLOBIN (HGB A1C): Hemoglobin A1C: 6.2 % — AB (ref 4.0–5.6)

## 2022-11-02 NOTE — Progress Notes (Signed)
[name] is a 87 y.o. male who presents for annual wellness visit and follow-up on chronic medical conditions.  He has had continued difficulty with back pain and did have a cardiac event interfering with this being cared for.  He has had multiple surgeries on his back.  He he does use a cane and does admit to multiple falls because of this.  Presently he is not scheduled for follow-up as they were waiting for clearance which apparently he has gotten from cardiology.  He does have history of  Aortic aneurysm and gets regular follow-up concerning this.  He also complains of increased difficulty with flatulence.   Immunizations and Health Maintenance Immunization History  Administered Date(s) Administered   COVID-19, mRNA, vaccine(Comirnaty)12 years and older 08/10/2022   DTaP 02/19/2000   Fluad Quad(high Dose 65+) 06/12/2020, 08/10/2022   Influenza Split 08/03/2002, 09/22/2006, 07/09/2008, 07/26/2012   Influenza Whole 07/15/2009   Influenza, High Dose Seasonal PF 08/17/2013, 05/29/2015, 10/19/2016, 07/20/2017, 07/20/2018, 06/19/2019, 06/05/2021   PFIZER Comirnaty(Gray Top)Covid-19 Tri-Sucrose Vaccine 09/08/2020   PFIZER(Purple Top)SARS-COV-2 Vaccination 11/09/2019, 12/04/2019   PNEUMOCOCCAL CONJUGATE-20 06/05/2021   Pfizer Covid-19 Vaccine Bivalent Booster 73yr & up 10/27/2021   Pneumococcal Conjugate-13 03/01/2017   Pneumococcal Polysaccharide-23 01/19/2007, 06/04/2014   Tdap 01/19/2007, 08/19/2021   Zoster Recombinat (Shingrix) 06/05/2021, 08/19/2021   Health Maintenance Due  Topic Date Due   COLONOSCOPY (Pts 45-471yrInsurance coverage will need to be confirmed)  09/21/2017   Diabetic kidney evaluation - Urine ACR  07/20/2018   FOOT EXAM  07/20/2018   OPHTHALMOLOGY EXAM  06/05/2021    Last colonoscopy: 09/11/2012 Last PSA: no result Dentist: with the last year Ophtho: more than 1 year ago Exercise: none   Other doctors caring for patient include: HaLeonie ManMD  Cardiology    Advanced Directives: Not on file copy asked for    Depression screen:  See questionnaire below.        11/02/2022   10:34 AM 10/27/2021   11:47 AM 10/22/2020    9:48 AM 06/12/2020   10:07 AM 07/20/2018    2:58 PM  Depression screen PHQ 2/9  Decreased Interest 0 0 0 0 0  Down, Depressed, Hopeless 0 0 0 0 0  PHQ - 2 Score 0 0 0 0 0    Fall Screen: See Questionaire below.      11/02/2022   10:34 AM 10/27/2021   11:46 AM 10/22/2020    9:48 AM 08/29/2019   11:46 AM 07/20/2018    2:58 PM  Fall Risk   Falls in the past year? '1 1 1 1 '$ No  Comment    Emmi Telephone Survey: data to providers prior to load   Number falls in past yr: '1 1 1 1   '$ Comment    Emmi Telephone Survey Actual Response = 1   Injury with Fall? 0 0 0 0   Risk for fall due to : History of fall(s) History of fall(s) Impaired balance/gait    Follow up Falls evaluation completed;Falls prevention discussed Falls evaluation completed       ADL screen:  See questionnaire below.  Functional Status Survey: Is the patient deaf or have difficulty hearing?: Yes Does the patient have difficulty seeing, even when wearing glasses/contacts?: No Does the patient have difficulty concentrating, remembering, or making decisions?: Yes Does the patient have difficulty walking or climbing stairs?: Yes Does the patient have difficulty dressing or bathing?: Yes Does the patient have difficulty doing errands alone such as  visiting a doctor's office or shopping?: No   Review of Systems  Constitutional: -, -unexpected weight change, -anorexia, -fatigue Allergy: -sneezing, -itching, -congestion Dermatology: denies changing moles, rash, lumps ENT: -runny nose, -ear pain, -sore throat,  Cardiology:  -chest pain, -palpitations, -orthopnea, Respiratory: -cough, -shortness of breath, -dyspnea on exertion, -wheezing,  Gastroenterology: -abdominal pain, -nausea, -vomiting, -diarrhea, -constipation, -dysphagia Hematology:  -bleeding or bruising problems Musculoskeletal: -arthralgias, -myalgias, -joint swelling, -back pain, - Ophthalmology: -vision changes,  Urology: -dysuria, -difficulty urinating,  -urinary frequency, -urgency, incontinence Neurology: -, -numbness, , -memory loss, -falls, -dizziness    PHYSICAL EXAM:  BP (!) 164/72   Pulse 65   Temp 97.6 F (36.4 C) (Oral)   Resp 16   Ht 5' 9.5" (1.765 m)   Wt 203 lb (92.1 kg)   SpO2 97% Comment: room air  BMI 29.55 kg/m   General Appearance: Alert, cooperative, no distress, appears stated age Head: Normocephalic, without obvious abnormality, atraumatic Eyes: PERRL, conjunctiva/corneas clear, EOM's intact, fundi benign Ears: Normal TM's and external ear canals Nose: Nares normal, mucosa normal, no drainage or sinus   tenderness Throat: Lips, mucosa, and tongue normal; teeth and gums normal Neck: Supple, no lymphadenopathy, thyroid:no enlargement/tenderness/nodules; no carotid bruit or JVD Lungs: Clear to auscultation bilaterally without wheezes, rales or ronchi; respirations unlabored Heart: Regular rate and rhythm, S1 and S2 normal, 2/6 SEM no gallop Abdomen: Soft, non-tender, nondistended, normoactive bowel sounds, no masses, no hepatosplenomegaly Extremities: No clubbing, cyanosis or edema Pulses: 2+ and symmetric all extremities Skin: Skin color, texture, turgor normal, no rashes or lesions Lymph nodes: Cervical, supraclavicular, and axillary nodes normal Neurologic: CNII-XII intact, normal strength, sensation and gait; reflexes 2+ and symmetric throughout   Psych: Normal mood, affect, hygiene and grooming Hemoglobin A1c is 6.2 ASSESSMENT/PLAN: Routine general medical examination at a health care facility - Plan: POCT glycosylated hemoglobin (Hb A1C)  CAD S/P CABG '95- several PCis since, last 05/27/22  Chronic diastolic CHF (congestive heart failure) (HCC)  Hypertensive heart disease with chronic diastolic congestive heart failure  (HCC)  Type 2 diabetes mellitus in remission ([place]) - Plan: POCT glycosylated hemoglobin (Hb A1C)  Statin myopathy  Chronic renal impairment, stage 3b (HCC)  Gastroesophageal reflux disease, unspecified whether esophagitis present  Encounter for long-term (current) use of medications - Plan: POCT glycosylated hemoglobin (Hb A1C)  Status post total bilateral knee replacement  Hyperlipidemia LDL goal <70; statin intolerant  History of colonic polyps  Obesity (BMI 30-39.9)    Discussed Immunization recommendations discussed.  Also discussed the back pain that he is having and encouraged him to call the neurosurgeon to see if they can take the neck step which I think is an epidural injection. I then discussed the flatulence with him and did give him some information concerning potential causes.  He is interested in a GI referral and I recommended that we try this first. Did recommend he get the RSV vaccine. Medicare Attestation I have personally reviewed: The patient's medical and social history Their use of alcohol, tobacco or illicit drugs Their current medications and supplements The patient's functional ability including ADLs,fall risks, home safety risks, cognitive, and hearing and visual impairment Diet and physical activities Evidence for depression or mood disorders  The patient's weight, height, and BMI have been recorded in the chart.  I have made referrals, counseling, and provided education to the patient based on review of the above and I have provided the patient with a written personalized care plan for preventive services.  Jill Alexanders, MD   11/02/2022

## 2022-11-03 LAB — CBC WITH DIFFERENTIAL/PLATELET
Basophils Absolute: 0 10*3/uL (ref 0.0–0.2)
Basos: 1 %
EOS (ABSOLUTE): 0 10*3/uL (ref 0.0–0.4)
Eos: 1 %
Hematocrit: 39.5 % (ref 37.5–51.0)
Hemoglobin: 12.8 g/dL — ABNORMAL LOW (ref 13.0–17.7)
Immature Grans (Abs): 0 10*3/uL (ref 0.0–0.1)
Immature Granulocytes: 0 %
Lymphocytes Absolute: 1.4 10*3/uL (ref 0.7–3.1)
Lymphs: 23 %
MCH: 31.4 pg (ref 26.6–33.0)
MCHC: 32.4 g/dL (ref 31.5–35.7)
MCV: 97 fL (ref 79–97)
Monocytes Absolute: 0.5 10*3/uL (ref 0.1–0.9)
Monocytes: 8 %
Neutrophils Absolute: 4.1 10*3/uL (ref 1.4–7.0)
Neutrophils: 67 %
Platelets: 214 10*3/uL (ref 150–450)
RBC: 4.08 x10E6/uL — ABNORMAL LOW (ref 4.14–5.80)
RDW: 14.2 % (ref 11.6–15.4)
WBC: 6.1 10*3/uL (ref 3.4–10.8)

## 2022-11-03 LAB — COMPREHENSIVE METABOLIC PANEL
ALT: 14 IU/L (ref 0–44)
AST: 19 IU/L (ref 0–40)
Albumin/Globulin Ratio: 1.9 (ref 1.2–2.2)
Albumin: 4.3 g/dL (ref 3.7–4.7)
Alkaline Phosphatase: 45 IU/L (ref 44–121)
BUN/Creatinine Ratio: 14 (ref 10–24)
BUN: 22 mg/dL (ref 8–27)
Bilirubin Total: 0.6 mg/dL (ref 0.0–1.2)
CO2: 21 mmol/L (ref 20–29)
Calcium: 9.7 mg/dL (ref 8.6–10.2)
Chloride: 104 mmol/L (ref 96–106)
Creatinine, Ser: 1.58 mg/dL — ABNORMAL HIGH (ref 0.76–1.27)
Globulin, Total: 2.3 g/dL (ref 1.5–4.5)
Glucose: 98 mg/dL (ref 70–99)
Potassium: 5.1 mmol/L (ref 3.5–5.2)
Sodium: 141 mmol/L (ref 134–144)
Total Protein: 6.6 g/dL (ref 6.0–8.5)
eGFR: 43 mL/min/{1.73_m2} — ABNORMAL LOW (ref >59)

## 2022-11-03 LAB — LIPID PANEL
Chol/HDL Ratio: 3.9 ratio (ref 0.0–5.0)
Cholesterol, Total: 188 mg/dL (ref 100–199)
HDL: 48 mg/dL (ref >39)
LDL Chol Calc (NIH): 121 mg/dL — ABNORMAL HIGH (ref 0–99)
Triglycerides: 102 mg/dL (ref 0–149)
VLDL Cholesterol Cal: 19 mg/dL (ref 5–40)

## 2022-11-05 ENCOUNTER — Other Ambulatory Visit: Payer: Self-pay | Admitting: Family Medicine

## 2022-11-05 DIAGNOSIS — I110 Hypertensive heart disease with heart failure: Secondary | ICD-10-CM

## 2022-11-14 ENCOUNTER — Other Ambulatory Visit: Payer: Self-pay | Admitting: Cardiology

## 2022-12-09 ENCOUNTER — Telehealth: Payer: Self-pay

## 2022-12-09 NOTE — Telephone Encounter (Signed)
   Pre-operative Risk Assessment    Patient Name: [name]  DOB: Sep 17, 1936 MRN: [id number]     Request for Surgical Clearance    Procedure:  L2-3 Lumbar Fusion w/ removal of hardware, Carpal Tunnel release   Date of Surgery:  Clearance TBD                                 Surgeon:  Dr. Sherley Bounds  Surgeon's Group or Practice Name:  [facility]  Phone number:  [phone] Fax number:  [phone]   Type of Clearance Requested:   - Medical    Type of Anesthesia:  Not Indicated   Additional requests/questions:    [name]   12/09/2022, 11:13 AM

## 2022-12-09 NOTE — Telephone Encounter (Signed)
Called patient to review symptoms, ensure no change since last phone call in preparation for preoperative clearance.  Left message for patient to call back. He will also need follow-up appointment scheduled with Dr. Ellyn Hack.

## 2022-12-10 ENCOUNTER — Telehealth: Payer: Self-pay | Admitting: Cardiology

## 2022-12-10 LAB — HM DIABETES EYE EXAM

## 2022-12-10 NOTE — Telephone Encounter (Signed)
Pt is returning [employer] call from yesterday.

## 2022-12-10 NOTE — Telephone Encounter (Addendum)
   Primary Cardiologist: Glenetta Hew, MD  Chart reviewed as part of pre-operative protocol coverage. Given past medical history and time since last visit, based on ACC/AHA guidelines, [name] would be at acceptable risk for the planned procedure without further cardiovascular testing.   Spoke with patient and his wife. He is able to achieve > 4 METS activity without concerning cardiac symptoms. He was advised that if he develops new symptoms prior to surgery to contact our office to arrange a follow-up appointment.  He verbalized understanding.  Per office protocol, he may hold Plavix for 5 days prior to procedure and should resume as soon as hemodynamically stable postoperatively. Ideally aspirin should be continued without interruption, however if the bleeding risk is too great, aspirin may be held for 7 days prior to surgery. Please resume aspirin post operatively when it is felt to be safe from a bleeding standpoint.   I will route this recommendation to the requesting party via Epic fax function and remove from pre-op pool.  Please call with questions.  Emmaline Life, NP-C  12/10/2022, 1:11 PM [address], [address] Office [phone] Fax [phone]

## 2022-12-14 DIAGNOSIS — H3530 Unspecified macular degeneration: Secondary | ICD-10-CM | POA: Insufficient documentation

## 2022-12-14 DIAGNOSIS — G5611 Other lesions of median nerve, right upper limb: Secondary | ICD-10-CM | POA: Insufficient documentation

## 2022-12-16 ENCOUNTER — Other Ambulatory Visit: Payer: Self-pay | Admitting: Neurological Surgery

## 2022-12-28 NOTE — Pre-Procedure Instructions (Signed)
Surgical Instructions    Your procedure is scheduled on Monday, January 10, 2023 at 11:16 AM.  Report to [facility] Main Entrance "A" at 9:15 A.M., then check in with the Admitting office.  Call this number if you have problems the morning of surgery:  [phone]   If you have any questions prior to your surgery date call [phone]: Open Monday-Friday 8am-4pm  *If you experience any cold or flu symptoms such as cough, fever, chills, shortness of breath, etc. between now and your scheduled surgery, please notify us.*    Remember:  Do not eat or drink after midnight the night before your surgery   Take these medicines the morning of surgery with A SIP OF WATER:  amLODipine (NORVASC)  ezetimibe (ZETIA)  pantoprazole (PROTONIX)  ranolazine (RANEXA)   STOP taking your clopidogrel (PLAVIX) 5 days prior to surgery. Last dose should be on 01/04/23.  Follow your surgeon's instructions on when to stop Aspirin.  If no instructions were given by your surgeon then you will need to call the office to get those instructions.     As of today, STOP taking any Aleve, Naproxen, Ibuprofen, Motrin, Advil, Goody's, BC's, all herbal medications, fish oil, and all vitamins.   HOW TO MANAGE YOUR DIABETES BEFORE AND AFTER SURGERY  Why is it important to control my blood sugar before and after surgery? Improving blood sugar levels before and after surgery helps healing and can limit problems. A way of improving blood sugar control is eating a healthy diet by:  Eating less sugar and carbohydrates  Increasing activity/exercise  Talking with your doctor about reaching your blood sugar goals High blood sugars (greater than 180 mg/dL) can raise your risk of infections and slow your recovery, so you will need to focus on controlling your diabetes during the weeks before surgery. Make sure that the doctor who takes care of your diabetes knows about your planned surgery including the date and  location.  How do I manage my blood sugar before surgery? Check your blood sugar at least 4 times a day, starting 2 days before surgery, to make sure that the level is not too high or low.  Check your blood sugar the morning of your surgery when you wake up and every 2 hours until you get to the Short Stay unit.  If your blood sugar is less than 70 mg/dL, you will need to treat for low blood sugar: Do not take insulin. Treat a low blood sugar (less than 70 mg/dL) with  cup of clear juice (cranberry or apple), 4 glucose tablets, OR glucose gel. Recheck blood sugar in 15 minutes after treatment (to make sure it is greater than 70 mg/dL). If your blood sugar is not greater than 70 mg/dL on recheck, call [phone] for further instructions. Report your blood sugar to the short stay nurse when you get to Short Stay.  If you are admitted to the hospital after surgery: Your blood sugar will be checked by the staff and you will probably be given insulin after surgery (instead of oral diabetes medicines) to make sure you have good blood sugar levels. The goal for blood sugar control after surgery is 80-180 mg/dL.                      Do NOT Smoke (Tobacco/Vaping) for 24 hours prior to your procedure.  If you use a CPAP at night, you may bring your mask/headgear for your overnight stay.  Contacts, glasses, piercing's, hearing aid's, dentures or partials may not be worn into surgery, please bring cases for these belongings.    For patients admitted to the hospital, discharge time will be determined by your treatment team.   Patients discharged the day of surgery will not be allowed to drive home, and someone needs to stay with them for 24 hours.  SURGICAL WAITING ROOM VISITATION Patients having surgery or a procedure may have 2 support people in the waiting area. Visitors may stay in the waiting area during the procedure and switch out with other visitors if needed. Only 1 support person is  allowed in the pre-op area with the patient AFTER the patient is prepped. This person cannot be switched out.  Children under the age of 95 must have an adult accompany them who is not the patient. If the patient needs to stay at the hospital during part of their recovery, the visitor guidelines for inpatient rooms apply.  Please refer to the [facility] website for the visitor guidelines for Inpatients (after your surgery is over and you are in a regular room).    Special instructions:   [facility]- Preparing For Surgery  Before surgery, you can play an important role. Because skin is not sterile, your skin needs to be as free of germs as possible. You can reduce the number of germs on your skin by washing with CHG (chlorahexidine gluconate) Soap before surgery.  CHG is an antiseptic cleaner which kills germs and bonds with the skin to continue killing germs even after washing.    Oral Hygiene is also important to reduce your risk of infection.  Remember - BRUSH YOUR TEETH THE MORNING OF SURGERY WITH YOUR REGULAR TOOTHPASTE  Please do not use if you have an allergy to CHG or antibacterial soaps. If your skin becomes reddened/irritated stop using the CHG.  Do not shave (including legs and underarms) for at least 48 hours prior to first CHG shower. It is OK to shave your face.  Please follow these instructions carefully.   Shower the NIGHT BEFORE SURGERY and the MORNING OF SURGERY  If you chose to wash your hair, wash your hair first as usual with your normal shampoo.  After you shampoo, rinse your hair and body thoroughly to remove the shampoo.  Use CHG Soap as you would any other liquid soap. You can apply CHG directly to the skin and wash gently with a scrungie or a clean washcloth.   Apply the CHG Soap to your body ONLY FROM THE NECK DOWN (neck, arms, chest, abdomen, legs, and back).  Do not use on open wounds or open sores. Avoid contact with your eyes, ears, mouth and genitals  (private parts). Wash Face and genitals (private parts)  with your normal soap.   Wash thoroughly, paying special attention to the area where your surgery will be performed.  Thoroughly rinse your body with warm water from the neck down.  DO NOT shower/wash with your normal soap after using and rinsing off the CHG Soap.  Pat yourself dry with a CLEAN TOWEL.  Wear CLEAN PAJAMAS to bed the night before surgery  Place CLEAN SHEETS on your bed the night before your surgery  DO NOT SLEEP WITH PETS.   Day of Surgery: Take a shower with CHG soap. Do not wear lotions, powders, perfumes/colognes, or deodorant. Do not wear jewelry or makeup Do not shave 48 hours prior to surgery.  Men may shave face and neck. Do not wear  nail polish, gel polish, artificial nails, or any other type of covering on natural nails (fingers and toes) If you have artificial nails or gel coating that need to be removed by a nail salon, please have this removed prior to surgery. Artificial nails or gel coating may interfere with anesthesia's ability to adequately monitor your vital signs. Wear Clean/Comfortable clothing the morning of surgery Do not bring valuables to the hospital.  [facility] is not responsible for any belongings or valuables. Remember to brush your teeth WITH YOUR REGULAR TOOTHPASTE.   Please read over the following fact sheets that you were given.  If you received a COVID test during your pre-op visit  it is requested that you wear a mask when out in public, stay away from anyone that may not be feeling well and notify your surgeon if you develop symptoms. If you have been in contact with anyone that has tested positive in the last 10 days please notify you surgeon.

## 2022-12-29 ENCOUNTER — Encounter (HOSPITAL_COMMUNITY): Payer: Self-pay

## 2022-12-29 ENCOUNTER — Telehealth: Payer: Self-pay | Admitting: Nurse Practitioner

## 2022-12-29 ENCOUNTER — Encounter (HOSPITAL_COMMUNITY)
Admission: RE | Admit: 2022-12-29 | Discharge: 2022-12-29 | Disposition: A | Discharge status: Home / Self Care | Payer: Medicare Other | Source: Ambulatory Visit | Attending: Neurological Surgery | Admitting: Neurological Surgery

## 2022-12-29 ENCOUNTER — Other Ambulatory Visit: Payer: Self-pay

## 2022-12-29 ENCOUNTER — Other Ambulatory Visit: Payer: Self-pay | Admitting: *Deleted

## 2022-12-29 ENCOUNTER — Other Ambulatory Visit: Payer: Self-pay | Admitting: Nurse Practitioner

## 2022-12-29 VITALS — BP 159/84 | HR 60 | Temp 97.6°F | Resp 18 | Ht 69.0 in | Wt 204.7 lb

## 2022-12-29 DIAGNOSIS — Z01818 Encounter for other preprocedural examination: Secondary | ICD-10-CM | POA: Diagnosis present

## 2022-12-29 DIAGNOSIS — E119 Type 2 diabetes mellitus without complications: Secondary | ICD-10-CM | POA: Insufficient documentation

## 2022-12-29 LAB — TYPE AND SCREEN
ABO/RH(D): A POS
Antibody Screen: NEGATIVE

## 2022-12-29 LAB — CBC
HCT: 38.9 % — ABNORMAL LOW (ref 39.0–52.0)
Hemoglobin: 12.3 g/dL — ABNORMAL LOW (ref 13.0–17.0)
MCH: 31 pg (ref 26.0–34.0)
MCHC: 31.6 g/dL (ref 30.0–36.0)
MCV: 98 fL (ref 80.0–100.0)
Platelets: 189 10*3/uL (ref 150–400)
RBC: 3.97 MIL/uL — ABNORMAL LOW (ref 4.22–5.81)
RDW: 14.4 % (ref 11.5–15.5)
WBC: 5.6 10*3/uL (ref 4.0–10.5)
nRBC: 0 % (ref 0.0–0.2)

## 2022-12-29 LAB — COMPREHENSIVE METABOLIC PANEL
ALT: 19 U/L (ref 0–44)
AST: 27 U/L (ref 15–41)
Albumin: 3.7 g/dL (ref 3.5–5.0)
Alkaline Phosphatase: 32 U/L — ABNORMAL LOW (ref 38–126)
Anion gap: 14 (ref 5–15)
BUN: 24 mg/dL — ABNORMAL HIGH (ref 8–23)
CO2: 23 mmol/L (ref 22–32)
Calcium: 9.5 mg/dL (ref 8.9–10.3)
Chloride: 102 mmol/L (ref 98–111)
Creatinine, Ser: 1.6 mg/dL — ABNORMAL HIGH (ref 0.61–1.24)
GFR, Estimated: 42 mL/min — ABNORMAL LOW (ref >60)
Glucose, Bld: 99 mg/dL (ref 70–99)
Potassium: 4.6 mmol/L (ref 3.5–5.1)
Sodium: 139 mmol/L (ref 135–145)
Total Bilirubin: 1.1 mg/dL (ref 0.3–1.2)
Total Protein: 6.5 g/dL (ref 6.5–8.1)

## 2022-12-29 LAB — SURGICAL PCR SCREEN
MRSA, PCR: NEGATIVE
Staphylococcus aureus: POSITIVE — AB

## 2022-12-29 LAB — PROTIME-INR
INR: 1.1 (ref 0.8–1.2)
Prothrombin Time: 13.9 seconds (ref 11.4–15.2)

## 2022-12-29 MED ORDER — APIXABAN 2.5 MG PO TABS: 2.5000 mg | ORAL_TABLET | Freq: Two times a day (BID) | ORAL | 11 refills | Status: DC

## 2022-12-29 MED ORDER — APIXABAN 2.5 MG PO TABS: 2.5000 mg | ORAL_TABLET | Freq: Two times a day (BID) | ORAL | 0 refills | Status: DC

## 2022-12-29 NOTE — Telephone Encounter (Signed)
Call placed to surgeon's office, spoke with Lorriane Shire.  She has been made aware, per Christen Bame, NP, will you please contact  Dr. Ronnald Ramp surgical scheduler and let them know that his EKG today revealed new onset a fib. The PA at the hospital reached out to me. We are going to start him on Eliquis and then have him stop in time for surgery. we usually recommend a 3 day hold prior to back surgery .  Lorriane Shire did make me aware they have scheduled his surgery for 01/10/23

## 2022-12-29 NOTE — Progress Notes (Signed)
PCP - Denita Lung, MD  Cardiologist - Dr Glenetta Hew  PPM/ICD - denies   Chest x-ray - N/A EKG - 12/29/2022  Stress Test - 05/26/21 ECHO - 06/10/21 Cardiac Cath - 08/20/21  Sleep Study - pt does not wear CPAP   Fasting Blood Sugar - Pt states he is no longer DM. Per PCP note pt has a history of DM but is in remission. Pt last A1c 6.2 in January. Pt states he occasionally checks his CBG- typically 90-110. I spoke with Karoline Caldwell, PA- no need for A1c today unless blood sugar elevated on lab work. Pt is in the pre-DM range for his A1c  Last dose of GLP1 agonist-  N/A   Blood Thinner Instructions: Pt states he was instructed to stop ASA and Plavix 5 days prior to surgery.    ERAS Protcol - NPO order   COVID TEST- N/A   Anesthesia review: yes- Cardiac history, Karoline Caldwell, PA reviewed EKG while pt at PAT d/t EKG stating "atrial fibrillation". Pt denies any cardiac symptoms.   Patient denies shortness of breath, fever, cough and chest pain at PAT appointment   All instructions explained to the patient, with a verbal understanding of the material. Patient agrees to go over the instructions while at home for a better understanding. Patient also instructed to self quarantine after being tested for COVID-19. The opportunity to ask questions was provided.

## 2022-12-29 NOTE — Telephone Encounter (Signed)
Working the preop pool today.  Received message from Karoline Caldwell, [place] for [facility] preop.  EKG today showed new onset atrial fibrillation.  EKG was confirmed by Dr. Sallyanne Kuster. I contacted patient and his wife and advised him to start Eliquis 2.5 mg twice daily. Advised him to stop aspirin. He has lumbar fusion scheduled for 01/10/23. In preparation for surgery, he was advised to stop Eliquis on 01/06/23. He has been instructed to stop Plavix 7 days prior to surgery. He has an appointment with Doreene Adas PA on 02/01/23. Will forward message to Dr. Ellyn Hack, primary cardiologist for further advisement.  Emmaline Life, NP-C 12/29/2022, 3:04 PM [address], [address] Office [phone] Fax [phone]

## 2022-12-29 NOTE — Pre-Procedure Instructions (Signed)
Surgical Instructions    Your procedure is scheduled on Monday, January 10, 2023 at 11:16 AM.  Report to [facility] Main Entrance "A" at 9:15 A.M., then check in with the Admitting office.  Call this number if you have problems the morning of surgery:  [phone]   If you have any questions prior to your surgery date call [phone]: Open Monday-Friday 8am-4pm  *If you experience any cold or flu symptoms such as cough, fever, chills, shortness of breath, etc. between now and your scheduled surgery, please notify us.*    Remember:  Do not eat or drink after midnight the night before your surgery   Take these medicines the morning of surgery with A SIP OF WATER:  amLODipine (NORVASC)  ezetimibe (ZETIA)  pantoprazole (PROTONIX)  ranolazine (RANEXA)    Follow your surgeon's instructions on when to stop Aspirin and Plavix (Clopidogrel).  If no instructions were given by your surgeon then you will need to call the office to get those instructions.     As of today, STOP taking any Aleve, Naproxen, Ibuprofen, Motrin, Advil, Goody's, BC's, all herbal medications, fish oil, and all vitamins.   HOW TO MANAGE YOUR DIABETES BEFORE AND AFTER SURGERY  Why is it important to control my blood sugar before and after surgery? Improving blood sugar levels before and after surgery helps healing and can limit problems. A way of improving blood sugar control is eating a healthy diet by:  Eating less sugar and carbohydrates  Increasing activity/exercise  Talking with your doctor about reaching your blood sugar goals High blood sugars (greater than 180 mg/dL) can raise your risk of infections and slow your recovery, so you will need to focus on controlling your diabetes during the weeks before surgery. Make sure that the doctor who takes care of your diabetes knows about your planned surgery including the date and location.  How do I manage my blood sugar before surgery? Check your blood sugar  at least 4 times a day, starting 2 days before surgery, to make sure that the level is not too high or low.  Check your blood sugar the morning of your surgery when you wake up and every 2 hours until you get to the Short Stay unit.  If your blood sugar is less than 70 mg/dL, you will need to treat for low blood sugar: Do not take insulin. Treat a low blood sugar (less than 70 mg/dL) with  cup of clear juice (cranberry or apple), 4 glucose tablets, OR glucose gel. Recheck blood sugar in 15 minutes after treatment (to make sure it is greater than 70 mg/dL). If your blood sugar is not greater than 70 mg/dL on recheck, call [phone] for further instructions. Report your blood sugar to the short stay nurse when you get to Short Stay.  If you are admitted to the hospital after surgery: Your blood sugar will be checked by the staff and you will probably be given insulin after surgery (instead of oral diabetes medicines) to make sure you have good blood sugar levels. The goal for blood sugar control after surgery is 80-180 mg/dL.                      Do NOT Smoke (Tobacco/Vaping) for 24 hours prior to your procedure.  If you use a CPAP at night, you may bring your mask/headgear for your overnight stay.   Contacts, glasses, piercing's, hearing aid's, dentures or partials may not be worn into  surgery, please bring cases for these belongings.    For patients admitted to the hospital, discharge time will be determined by your treatment team.   Patients discharged the day of surgery will not be allowed to drive home, and someone needs to stay with them for 24 hours.  SURGICAL WAITING ROOM VISITATION Patients having surgery or a procedure may have 2 support people in the waiting area. Visitors may stay in the waiting area during the procedure and switch out with other visitors if needed. Only 1 support person is allowed in the pre-op area with the patient AFTER the patient is prepped. This person  cannot be switched out.  Children under the age of 56 must have an adult accompany them who is not the patient. If the patient needs to stay at the hospital during part of their recovery, the visitor guidelines for inpatient rooms apply.  Please refer to the [facility] website for the visitor guidelines for Inpatients (after your surgery is over and you are in a regular room).    Special instructions:   [facility]- Preparing For Surgery  Before surgery, you can play an important role. Because skin is not sterile, your skin needs to be as free of germs as possible. You can reduce the number of germs on your skin by washing with CHG (chlorahexidine gluconate) Soap before surgery.  CHG is an antiseptic cleaner which kills germs and bonds with the skin to continue killing germs even after washing.    Oral Hygiene is also important to reduce your risk of infection.  Remember - BRUSH YOUR TEETH THE MORNING OF SURGERY WITH YOUR REGULAR TOOTHPASTE  Please do not use if you have an allergy to CHG or antibacterial soaps. If your skin becomes reddened/irritated stop using the CHG.  Do not shave (including legs and underarms) for at least 48 hours prior to first CHG shower. It is OK to shave your face.  Please follow these instructions carefully.   Shower the NIGHT BEFORE SURGERY and the MORNING OF SURGERY  If you chose to wash your hair, wash your hair first as usual with your normal shampoo.  After you shampoo, rinse your hair and body thoroughly to remove the shampoo.  Use CHG Soap as you would any other liquid soap. You can apply CHG directly to the skin and wash gently with a scrungie or a clean washcloth.   Apply the CHG Soap to your body ONLY FROM THE NECK DOWN (neck, arms, chest, abdomen, legs, and back).  Do not use on open wounds or open sores. Avoid contact with your eyes, ears, mouth and genitals (private parts). Wash Face and genitals (private parts)  with your normal soap.   Wash  thoroughly, paying special attention to the area where your surgery will be performed.  Thoroughly rinse your body with warm water from the neck down.  DO NOT shower/wash with your normal soap after using and rinsing off the CHG Soap.  Pat yourself dry with a CLEAN TOWEL.  Wear CLEAN PAJAMAS to bed the night before surgery  Place CLEAN SHEETS on your bed the night before your surgery  DO NOT SLEEP WITH PETS.   Day of Surgery: Take a shower with CHG soap. Do not wear lotions, powders, perfumes/colognes, or deodorant. Do not wear jewelry or makeup Do not shave 48 hours prior to surgery.  Men may shave face and neck. Do not wear nail polish, gel polish, artificial nails, or any other type of covering on  natural nails (fingers and toes) If you have artificial nails or gel coating that need to be removed by a nail salon, please have this removed prior to surgery. Artificial nails or gel coating may interfere with anesthesia's ability to adequately monitor your vital signs. Wear Clean/Comfortable clothing the morning of surgery Do not bring valuables to the hospital.  [facility] is not responsible for any belongings or valuables. Remember to brush your teeth WITH YOUR REGULAR TOOTHPASTE.   Please read over the following fact sheets that you were given.  If you received a COVID test during your pre-op visit  it is requested that you wear a mask when out in public, stay away from anyone that may not be feeling well and notify your surgeon if you develop symptoms. If you have been in contact with anyone that has tested positive in the last 10 days please notify you surgeon.

## 2022-12-30 NOTE — Progress Notes (Signed)
Anesthesia Chart Review:  Patient follows cardiology for history of CAD s/p CABG 1995, multiple PCI with most recent catheterization 08/21/2021 with successful PCI/DES of distal circumflex obtuse marginal branch stenosis involving distal edge of previously placed stent, HTN, mild aortic stenosis, chronic HFpEF, HLD with statin myopathy, PAD.  He was seen by Christen Bame, NP on 10/15/2022 for preop evaluation.  At that time he was cleared to proceed with surgery with recommendation to hold Plavix for 7 days prior.  At PAT appointment patient was noted to be in new onset rate controlled A-fib.  He was asymptomatic from this.  I reached out to Christen Bame, NP.  She reviewed EKG with Dr. Sallyanne Kuster who confirmed atrial fibrillation.  Patient was contacted and advised to start Eliquis 2.5 mg twice daily and discontinue aspirin.  It was noted that he has lumbar fusion scheduled for 824 and he was instructed to stop Eliquis on 01/06/2023.  It was also noted that he been previously instructed to stop Plavix 7 days prior to surgery.  History of AAA treated with infrarenal fixation 2016 with a [employer].  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  Last seen by Dr. Earleen Newport on 06/22/2022.  Per note, "- He understands our recommendation for endovascular and/or percutaneous treatment, however, he does not feel strongly about pursuing treatment at this time given low overall risk of late sac rupture.  We will schedule another follow up office visit in 1 year, with repeat non-contrast CT scan abd/pelvis for surveillance. - continue medical care."  History of CKD 3.  History of hiatal hernia.  Labs reviewed, creatinine elevated 1.60 consistent with history of CKD, mild anemia hemoglobin 12.3, otherwise unremarkable.  EKG 12/29/2022: Atrial fibrillation.  Rate 60.  Nonspecific ST and T wave abnormality.  CT abdomen pelvis 06/15/2022: IMPRESSION: 1. Postsurgical changes from  aorto bi-iliac endograft repair of an infrarenal abdominal aortic aneurysm. Continued enlargement of the excluded aneurysm sac, now measuring 7.0 x 5.7 cm, previously measured 6.6 x 5.5 cm on 06/25/2021. Findings suggest ongoing endoleak. 2. Sigmoid diverticulosis. 3. Moderate-sized hiatal hernia. 4. Aortic and coronary artery atherosclerosis (ICD10-I70.0).  Cath and PCI 08/20/2021:   Ost LAD to Prox LAD lesion is 100% stenosed.   Dist Cx lesion is 99% stenosed.   Previously placed Prox RCA to Mid RCA stent (unknown type) is  widely patent.   Previously placed Mid Cx to Dist Cx stent (unknown type) is  widely patent.   A drug-eluting stent was successfully placed.   Post intervention, there is a 0% residual stenosis.   Post intervention, there is a 0% residual stenosis.  TTE 06/10/2021:  1. Left ventricular ejection fraction, by estimation, is 45 to 50%. The  left ventricle has mildly decreased function. The left ventricle  demonstrates regional wall motion abnormalities (see scoring  diagram/findings for description). The left ventricular   internal cavity size was mildly dilated. Left ventricular diastolic  parameters are consistent with Grade I diastolic dysfunction (impaired  relaxation). There is moderate hypokinesis of the left ventricular,  basal-mid inferior wall and inferolateral  wall.   2. Right ventricular systolic function is mildly reduced. The right  ventricular size is normal.   3. Left atrial size was severely dilated.   4. The mitral valve is normal in structure. Mild to moderate mitral valve  regurgitation. No evidence of mitral stenosis. Moderate mitral annular  calcification.   5. The aortic valve is tricuspid. There is moderate calcification  of the  aortic valve. There is moderate thickening of the aortic valve. Aortic  valve regurgitation is not visualized. Mild aortic valve stenosis. Aortic  valve mean gradient measures 11.0  mmHg. Aortic valve Vmax  measures 2.23 m/s.   Comparison(s): Prior images reviewed side by side. The left ventricular  systolic function is unchanged. The left ventricular diastolic function  has improved. The left ventricular wall motion abnormality is unchanged.  Aortic valve stenosis appears largely   unchanged, although gradients may be underestimated on the current sudy  due to poor Doppler beam alignment.    Wynonia Musty [facility] Short Stay Center/Anesthesiology Phone [phone] 12/30/2022 3:21 PM

## 2022-12-30 NOTE — Anesthesia Preprocedure Evaluation (Cosign Needed Addendum)
Anesthesia Evaluation  Patient identified by MRN, date of birth, ID band Patient awake    Reviewed: Allergy & Precautions, H&P , NPO status , Patient's Chart, lab work & pertinent test results  Airway Mallampati: III  TM Distance: >3 FB Neck ROM: Full    Dental no notable dental hx. (+) Partial Upper, Partial Lower, Dental Advisory Given   Pulmonary asthma , sleep apnea , former smoker   Pulmonary exam normal breath sounds clear to auscultation       Cardiovascular hypertension, Pt. on medications + CAD, + Past MI, + Cardiac Stents, + Peripheral Vascular Disease and +CHF   Rhythm:Regular Rate:Normal     Neuro/Psych negative neurological ROS  negative psych ROS   GI/Hepatic Neg liver ROS, hiatal hernia,GERD  Medicated,,  Endo/Other  diabetes    Renal/GU Renal InsufficiencyRenal disease  negative genitourinary   Musculoskeletal  (+) Arthritis , Osteoarthritis,    Abdominal   Peds  Hematology  (+) Blood dyscrasia, anemia   Anesthesia Other Findings   Reproductive/Obstetrics negative OB ROS                             Anesthesia Physical Anesthesia Plan  ASA: 3  Anesthesia Plan: General   Post-op Pain Management: Tylenol PO (pre-op)*   Induction: Intravenous  PONV Risk Score and Plan: 3 and Ondansetron, Dexamethasone and Treatment may vary due to age or medical condition  Airway Management Planned: Oral ETT  Additional Equipment: Arterial line  Intra-op Plan:   Post-operative Plan: Extubation in OR  Informed Consent: I have reviewed the patients History and Physical, chart, labs and discussed the procedure including the risks, benefits and alternatives for the proposed anesthesia with the patient or authorized representative who has indicated his/her understanding and acceptance.     Dental advisory given  Plan Discussed with: CRNA  Anesthesia Plan Comments: (PAT note by  Antionette PolesJames Burns, PA-C: Patient follows cardiology for history of CAD s/p CABG 1995, multiple PCI with most recent catheterization 08/21/2021 with successful PCI/DES of distal circumflex obtuse marginal branch stenosis involving distal edge of previously placed stent, HTN, mild aortic stenosis, chronic HFpEF, HLD with statin myopathy, PAD.  He was seen by Eligha BridegroomMichelle Swinyer, NP on 10/15/2022 for preop evaluation.  At that time he was cleared to proceed with surgery with recommendation to hold Plavix for 7 days prior.  At PAT appointment patient was noted to be in new onset rate controlled A-fib.  He was asymptomatic from this.  I reached out to Eligha BridegroomMichelle Swinyer, NP.  She reviewed EKG with Dr. Royann Shiversroitoru who confirmed atrial fibrillation.  Patient was contacted and advised to start Eliquis 2.5 mg twice daily and discontinue aspirin.  It was noted that he has lumbar fusion scheduled for 824 and he was instructed to stop Eliquis on 01/06/2023.  It was also noted that he been previously instructed to stop Plavix 7 days prior to surgery.  History of AAA treated with infrarenal fixation 2016 with a [employer] ex[employer].  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  Last seen by Dr. Loreta AveWagner on 06/22/2022.  Per note, "- He understands our recommendation for endovascular and/or percutaneous treatment, however, he does not feel strongly about pursuing treatment at this time given low overall risk of late sac rupture.  We will schedule another follow up office visit in 1 year, with repeat non-contrast CT scan abd/pelvis for surveillance.- continue medical care."  History of CKD 3.  History of hiatal hernia.  Labs reviewed, creatinine elevated 1.60 consistent with history of CKD, mild anemia hemoglobin 12.3, otherwise unremarkable.  EKG 12/29/2022: Atrial fibrillation.  Rate 60.  Nonspecific ST and T wave abnormality.  CT abdomen pelvis 06/15/2022: IMPRESSION: 1. 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 Continued enlargement of the excluded aneurysm sac, now measuring 7.0 x 5.7 cm, previously measured 6.6 x 5.5 cm on 06/25/2021. Findings suggest ongoing endoleak. 2. Sigmoid diverticulosis. 3. Moderate-sized hiatal hernia. 4. Aortic and coronary artery atherosclerosis (ICD10-I70.0).  Cath and PCI 08/20/2021:   Ost LAD to Prox LAD lesion is 100% stenosed.   Dist Cx lesion is 99% stenosed.   Previously placed Prox RCA to Mid RCA stent (unknown type) is  widely patent.   Previously placed Mid Cx to Dist Cx stent (unknown type) is  widely patent.   A drug-eluting stent was successfully placed.   Post intervention, there is a 0% residual stenosis.   Post intervention, there is a 0% residual stenosis.  TTE 06/10/2021: 1. Left ventricular ejection fraction, by estimation, is 45 to 50%. The  left ventricle has mildly decreased function. The left ventricle  demonstrates regional wall motion abnormalities (see scoring  diagram/findings for description). The left ventricular  internal cavity size was mildly dilated. Left ventricular diastolic  parameters are consistent with Grade I diastolic dysfunction (impaired  relaxation). There is moderate hypokinesis of the left ventricular,  basal-mid inferior wall and inferolateral  wall.  2. Right ventricular systolic function is mildly reduced. The right  ventricular size is normal.  3. Left atrial size was severely dilated.  4. The mitral valve is normal in structure. Mild to moderate mitral valve  regurgitation. No evidence of mitral stenosis. Moderate mitral annular  calcification.  5. The aortic valve is tricuspid. There is moderate calcification of the  aortic valve. There is moderate thickening of the aortic valve. Aortic  valve regurgitation is not visualized. Mild aortic valve stenosis. Aortic  valve mean gradient measures 11.0  mmHg. Aortic valve Vmax  measures 2.23 m/s.   Comparison(s): Prior images reviewed side by side. The left ventricular  systolic function is unchanged. The left ventricular diastolic function  has improved. The left ventricular wall motion abnormality is unchanged.  Aortic valve stenosis appears largely  unchanged, although gradients may be underestimated on the current sudy  due to poor Doppler beam alignment.   )        Anesthesia Quick Evaluation

## 2022-12-30 NOTE — Telephone Encounter (Signed)
  Pt's wife calling, she said, she have some questions about pt's medications

## 2022-12-30 NOTE — Telephone Encounter (Signed)
Returned call to patient's wife [name] ([place] per [facility]).  [name] is requesting clarification on medications. Patient is to stop taking aspirin. Start Eliquis 2.5mg  BID, will hold prior to surgery on 01/06/23. Patient will continue to take Plavix and will hold 7 days prior to surgery (01/03/23).  Dorothy repeated back medication/hold instructions and verbalized understanding.

## 2023-01-10 ENCOUNTER — Inpatient Hospital Stay (HOSPITAL_COMMUNITY): Payer: Medicare Other | Admitting: General Practice

## 2023-01-10 ENCOUNTER — Encounter (HOSPITAL_COMMUNITY)
Admission: RE | Disposition: A | Discharge status: Home Health | Payer: Self-pay | Source: Home / Self Care | Attending: Neurological Surgery

## 2023-01-10 ENCOUNTER — Inpatient Hospital Stay (HOSPITAL_COMMUNITY)
Admission: RE | Admit: 2023-01-10 | Discharge: 2023-01-11 | DRG: 455 | Disposition: A | Discharge status: Home Health | Payer: Medicare Other | Attending: Neurological Surgery | Admitting: Neurological Surgery

## 2023-01-10 ENCOUNTER — Other Ambulatory Visit: Payer: Self-pay

## 2023-01-10 ENCOUNTER — Inpatient Hospital Stay (HOSPITAL_COMMUNITY): Payer: Medicare Other

## 2023-01-10 ENCOUNTER — Inpatient Hospital Stay (HOSPITAL_COMMUNITY): Payer: Medicare Other | Admitting: Physician Assistant

## 2023-01-10 ENCOUNTER — Encounter (HOSPITAL_COMMUNITY): Payer: Self-pay | Admitting: Neurological Surgery

## 2023-01-10 DIAGNOSIS — M48061 Spinal stenosis, lumbar region without neurogenic claudication: Secondary | ICD-10-CM

## 2023-01-10 DIAGNOSIS — Z833 Family history of diabetes mellitus: Secondary | ICD-10-CM

## 2023-01-10 DIAGNOSIS — Z8679 Personal history of other diseases of the circulatory system: Secondary | ICD-10-CM

## 2023-01-10 DIAGNOSIS — M1990 Unspecified osteoarthritis, unspecified site: Secondary | ICD-10-CM

## 2023-01-10 DIAGNOSIS — E1140 Type 2 diabetes mellitus with diabetic neuropathy, unspecified: Secondary | ICD-10-CM | POA: Diagnosis present

## 2023-01-10 DIAGNOSIS — M549 Dorsalgia, unspecified: Secondary | ICD-10-CM | POA: Diagnosis present

## 2023-01-10 DIAGNOSIS — Z7901 Long term (current) use of anticoagulants: Secondary | ICD-10-CM | POA: Diagnosis not present

## 2023-01-10 DIAGNOSIS — I252 Old myocardial infarction: Secondary | ICD-10-CM

## 2023-01-10 DIAGNOSIS — Z823 Family history of stroke: Secondary | ICD-10-CM | POA: Diagnosis not present

## 2023-01-10 DIAGNOSIS — E119 Type 2 diabetes mellitus without complications: Secondary | ICD-10-CM | POA: Diagnosis not present

## 2023-01-10 DIAGNOSIS — Z79899 Other long term (current) drug therapy: Secondary | ICD-10-CM

## 2023-01-10 DIAGNOSIS — Z955 Presence of coronary angioplasty implant and graft: Secondary | ICD-10-CM | POA: Diagnosis not present

## 2023-01-10 DIAGNOSIS — Z803 Family history of malignant neoplasm of breast: Secondary | ICD-10-CM

## 2023-01-10 DIAGNOSIS — M4316 Spondylolisthesis, lumbar region: Principal | ICD-10-CM | POA: Diagnosis present

## 2023-01-10 DIAGNOSIS — G5601 Carpal tunnel syndrome, right upper limb: Secondary | ICD-10-CM

## 2023-01-10 DIAGNOSIS — G4730 Sleep apnea, unspecified: Secondary | ICD-10-CM | POA: Diagnosis present

## 2023-01-10 DIAGNOSIS — Z87891 Personal history of nicotine dependence: Secondary | ICD-10-CM | POA: Diagnosis not present

## 2023-01-10 DIAGNOSIS — I2510 Atherosclerotic heart disease of native coronary artery without angina pectoris: Secondary | ICD-10-CM | POA: Diagnosis present

## 2023-01-10 DIAGNOSIS — E785 Hyperlipidemia, unspecified: Secondary | ICD-10-CM | POA: Diagnosis present

## 2023-01-10 DIAGNOSIS — Z951 Presence of aortocoronary bypass graft: Secondary | ICD-10-CM

## 2023-01-10 DIAGNOSIS — Z7902 Long term (current) use of antithrombotics/antiplatelets: Secondary | ICD-10-CM

## 2023-01-10 DIAGNOSIS — Z96653 Presence of artificial knee joint, bilateral: Secondary | ICD-10-CM | POA: Diagnosis present

## 2023-01-10 DIAGNOSIS — Z8249 Family history of ischemic heart disease and other diseases of the circulatory system: Secondary | ICD-10-CM

## 2023-01-10 DIAGNOSIS — I10 Essential (primary) hypertension: Secondary | ICD-10-CM | POA: Diagnosis present

## 2023-01-10 DIAGNOSIS — Z981 Arthrodesis status: Principal | ICD-10-CM

## 2023-01-10 DIAGNOSIS — E1151 Type 2 diabetes mellitus with diabetic peripheral angiopathy without gangrene: Secondary | ICD-10-CM | POA: Diagnosis present

## 2023-01-10 DIAGNOSIS — Z8261 Family history of arthritis: Secondary | ICD-10-CM | POA: Diagnosis not present

## 2023-01-10 DIAGNOSIS — Z86010 Personal history of colonic polyps: Secondary | ICD-10-CM | POA: Diagnosis not present

## 2023-01-10 DIAGNOSIS — K219 Gastro-esophageal reflux disease without esophagitis: Secondary | ICD-10-CM | POA: Diagnosis present

## 2023-01-10 HISTORY — PX: CARPAL TUNNEL RELEASE: SHX101

## 2023-01-10 LAB — GLUCOSE, CAPILLARY
Glucose-Capillary: 106 mg/dL — ABNORMAL HIGH (ref 70–99)
Glucose-Capillary: 119 mg/dL — ABNORMAL HIGH (ref 70–99)

## 2023-01-10 SURGERY — POSTERIOR LUMBAR FUSION 1 LEVEL
Anesthesia: General | Site: Wrist | Laterality: Right

## 2023-01-10 MED ORDER — METHOCARBAMOL 1000 MG/10ML IJ SOLN: 500.0000 mg | Freq: Four times a day (QID) | INTRAVENOUS | Status: DC | PRN

## 2023-01-10 MED ORDER — POTASSIUM CHLORIDE IN NACL 20-0.9 MEQ/L-% IV SOLN: INTRAVENOUS | Status: DC

## 2023-01-10 MED ORDER — HYDROCODONE-ACETAMINOPHEN 10-325 MG PO TABS
1.0000 | ORAL_TABLET | ORAL | Status: DC | PRN
  Filled 2023-01-10: qty 1

## 2023-01-10 MED ORDER — CHLORHEXIDINE GLUCONATE CLOTH 2 % EX PADS: 6.0000 | MEDICATED_PAD | Freq: Once | CUTANEOUS | Status: DC

## 2023-01-10 MED ORDER — GLYCOPYRROLATE 0.2 MG/ML IJ SOLN
INTRAMUSCULAR | Status: DC | PRN
  Administered 2023-01-10 (×2): .1 mg via INTRAVENOUS

## 2023-01-10 MED ORDER — DEXAMETHASONE SODIUM PHOSPHATE 10 MG/ML IJ SOLN
INTRAMUSCULAR | Status: DC | PRN
  Administered 2023-01-10: 10 mg via INTRAVENOUS

## 2023-01-10 MED ORDER — 0.9 % SODIUM CHLORIDE (POUR BTL) OPTIME
TOPICAL | Status: DC | PRN
  Administered 2023-01-10: 1000 mL

## 2023-01-10 MED ORDER — FERROUS SULFATE 325 (65 FE) MG PO TABS: 325.0000 mg | ORAL_TABLET | Freq: Every day | ORAL | Status: DC

## 2023-01-10 MED ORDER — ROCURONIUM BROMIDE 10 MG/ML (PF) SYRINGE
PREFILLED_SYRINGE | INTRAVENOUS | Status: DC | PRN
  Administered 2023-01-10 (×2): 20 mg via INTRAVENOUS
  Administered 2023-01-10: 60 mg via INTRAVENOUS

## 2023-01-10 MED ORDER — SENNA 8.6 MG PO TABS
1.0000 | ORAL_TABLET | Freq: Two times a day (BID) | ORAL | Status: DC
  Administered 2023-01-10: 8.6 mg via ORAL
  Filled 2023-01-10: qty 1

## 2023-01-10 MED ORDER — PHENOL 1.4 % MT LIQD: 1.0000 | OROMUCOSAL | Status: DC | PRN

## 2023-01-10 MED ORDER — MORPHINE SULFATE (PF) 2 MG/ML IV SOLN: 2.0000 mg | INTRAVENOUS | Status: DC | PRN

## 2023-01-10 MED ORDER — METHOCARBAMOL 500 MG PO TABS: 500.0000 mg | ORAL_TABLET | Freq: Four times a day (QID) | ORAL | Status: DC | PRN

## 2023-01-10 MED ORDER — HYDROMORPHONE HCL 1 MG/ML IJ SOLN
INTRAMUSCULAR | Status: AC
  Filled 2023-01-10: qty 1

## 2023-01-10 MED ORDER — CEFAZOLIN SODIUM-DEXTROSE 2-4 GM/100ML-% IV SOLN
2.0000 g | Freq: Three times a day (TID) | INTRAVENOUS | Status: AC
  Administered 2023-01-10 – 2023-01-11 (×2): 2 g via INTRAVENOUS
  Filled 2023-01-10 (×2): qty 100

## 2023-01-10 MED ORDER — FENTANYL CITRATE (PF) 250 MCG/5ML IJ SOLN
INTRAMUSCULAR | Status: AC
  Filled 2023-01-10: qty 5

## 2023-01-10 MED ORDER — BUPIVACAINE HCL (PF) 0.25 % IJ SOLN
INTRAMUSCULAR | Status: DC | PRN
  Administered 2023-01-10: 4 mL
  Administered 2023-01-10: 7 mL

## 2023-01-10 MED ORDER — ORAL CARE MOUTH RINSE: 15.0000 mL | Freq: Once | OROMUCOSAL | Status: AC

## 2023-01-10 MED ORDER — THROMBIN 20000 UNITS EX SOLR: CUTANEOUS | Status: DC | PRN

## 2023-01-10 MED ORDER — ONDANSETRON HCL 4 MG/2ML IJ SOLN: 4.0000 mg | Freq: Four times a day (QID) | INTRAMUSCULAR | Status: DC | PRN

## 2023-01-10 MED ORDER — CEFAZOLIN SODIUM-DEXTROSE 2-4 GM/100ML-% IV SOLN
2.0000 g | INTRAVENOUS | Status: AC
  Administered 2023-01-10: 2 g via INTRAVENOUS
  Filled 2023-01-10: qty 100

## 2023-01-10 MED ORDER — PROPOFOL 10 MG/ML IV BOLUS
INTRAVENOUS | Status: AC
  Filled 2023-01-10: qty 20

## 2023-01-10 MED ORDER — PROPOFOL 10 MG/ML IV BOLUS
INTRAVENOUS | Status: DC | PRN
  Administered 2023-01-10: 100 mg via INTRAVENOUS

## 2023-01-10 MED ORDER — VALSARTAN-HYDROCHLOROTHIAZIDE 320-12.5 MG PO TABS: 1.0000 | ORAL_TABLET | Freq: Every day | ORAL | Status: DC

## 2023-01-10 MED ORDER — CHLORHEXIDINE GLUCONATE 0.12 % MT SOLN
15.0000 mL | Freq: Once | OROMUCOSAL | Status: AC
  Administered 2023-01-10: 15 mL via OROMUCOSAL
  Filled 2023-01-10: qty 15

## 2023-01-10 MED ORDER — ONDANSETRON HCL 4 MG/2ML IJ SOLN
INTRAMUSCULAR | Status: DC | PRN
  Administered 2023-01-10: 4 mg via INTRAVENOUS

## 2023-01-10 MED ORDER — THROMBIN 5000 UNITS EX SOLR
CUTANEOUS | Status: AC
  Filled 2023-01-10: qty 5000

## 2023-01-10 MED ORDER — FENOFIBRATE 160 MG PO TABS: 160.0000 mg | ORAL_TABLET | Freq: Every day | ORAL | Status: DC

## 2023-01-10 MED ORDER — FENTANYL CITRATE (PF) 250 MCG/5ML IJ SOLN
INTRAMUSCULAR | Status: DC | PRN
  Administered 2023-01-10 (×3): 50 ug via INTRAVENOUS

## 2023-01-10 MED ORDER — LIDOCAINE 2% (20 MG/ML) 5 ML SYRINGE
INTRAMUSCULAR | Status: DC | PRN
  Administered 2023-01-10: 60 mg via INTRAVENOUS

## 2023-01-10 MED ORDER — ACETAMINOPHEN 500 MG PO TABS
1000.0000 mg | ORAL_TABLET | ORAL | Status: AC
  Administered 2023-01-10: 1000 mg via ORAL
  Filled 2023-01-10: qty 2

## 2023-01-10 MED ORDER — HYDROCHLOROTHIAZIDE 12.5 MG PO TABS: 12.5000 mg | ORAL_TABLET | Freq: Every day | ORAL | Status: DC

## 2023-01-10 MED ORDER — ADULT MULTIVITAMIN W/MINERALS CH
1.0000 | ORAL_TABLET | Freq: Every day | ORAL | Status: DC
  Filled 2023-01-10: qty 1

## 2023-01-10 MED ORDER — LACTATED RINGERS IV SOLN: INTRAVENOUS | Status: DC | PRN

## 2023-01-10 MED ORDER — ONDANSETRON HCL 4 MG PO TABS: 4.0000 mg | ORAL_TABLET | Freq: Four times a day (QID) | ORAL | Status: DC | PRN

## 2023-01-10 MED ORDER — IRBESARTAN 150 MG PO TABS
300.0000 mg | ORAL_TABLET | Freq: Every day | ORAL | Status: DC
  Administered 2023-01-10: 300 mg via ORAL
  Filled 2023-01-10: qty 2

## 2023-01-10 MED ORDER — SODIUM CHLORIDE 0.9% FLUSH
3.0000 mL | Freq: Two times a day (BID) | INTRAVENOUS | Status: DC
  Administered 2023-01-10: 3 mL via INTRAVENOUS

## 2023-01-10 MED ORDER — MENTHOL 3 MG MT LOZG: 1.0000 | LOZENGE | OROMUCOSAL | Status: DC | PRN

## 2023-01-10 MED ORDER — SUGAMMADEX SODIUM 200 MG/2ML IV SOLN
INTRAVENOUS | Status: DC | PRN
  Administered 2023-01-10: 200 mg via INTRAVENOUS

## 2023-01-10 MED ORDER — PANTOPRAZOLE SODIUM 40 MG PO TBEC
40.0000 mg | DELAYED_RELEASE_TABLET | Freq: Every day | ORAL | Status: DC
  Administered 2023-01-10: 40 mg via ORAL
  Filled 2023-01-10: qty 1

## 2023-01-10 MED ORDER — FENOFIBRIC ACID 135 MG PO CPDR: 1.0000 | DELAYED_RELEASE_CAPSULE | Freq: Every day | ORAL | Status: DC

## 2023-01-10 MED ORDER — THROMBIN 5000 UNITS EX SOLR: OROMUCOSAL | Status: DC | PRN

## 2023-01-10 MED ORDER — AMLODIPINE BESYLATE 5 MG PO TABS: 5.0000 mg | ORAL_TABLET | Freq: Every day | ORAL | Status: DC

## 2023-01-10 MED ORDER — HYDROMORPHONE HCL 1 MG/ML IJ SOLN
0.2500 mg | INTRAMUSCULAR | Status: DC | PRN
  Administered 2023-01-10 (×4): 0.25 mg via INTRAVENOUS

## 2023-01-10 MED ORDER — BACITRACIN ZINC 500 UNIT/GM EX OINT
TOPICAL_OINTMENT | CUTANEOUS | Status: AC
  Filled 2023-01-10: qty 28.35

## 2023-01-10 MED ORDER — THROMBIN 20000 UNITS EX SOLR
CUTANEOUS | Status: AC
  Filled 2023-01-10: qty 20000

## 2023-01-10 MED ORDER — BUPIVACAINE HCL (PF) 0.25 % IJ SOLN
INTRAMUSCULAR | Status: AC
  Filled 2023-01-10: qty 30

## 2023-01-10 MED ORDER — EPHEDRINE SULFATE-NACL 50-0.9 MG/10ML-% IV SOSY
PREFILLED_SYRINGE | INTRAVENOUS | Status: DC | PRN
  Administered 2023-01-10: 2.5 mg via INTRAVENOUS
  Administered 2023-01-10: 5 mg via INTRAVENOUS
  Administered 2023-01-10 (×2): 2.5 mg via INTRAVENOUS

## 2023-01-10 MED ORDER — ACETAMINOPHEN 500 MG PO TABS
1000.0000 mg | ORAL_TABLET | Freq: Four times a day (QID) | ORAL | Status: DC
  Administered 2023-01-10 – 2023-01-11 (×3): 1000 mg via ORAL
  Filled 2023-01-10 (×3): qty 2

## 2023-01-10 MED ORDER — SODIUM CHLORIDE 0.9 % IV SOLN: 250.0000 mL | INTRAVENOUS | Status: DC

## 2023-01-10 MED ORDER — PHENYLEPHRINE 80 MCG/ML (10ML) SYRINGE FOR IV PUSH (FOR BLOOD PRESSURE SUPPORT)
PREFILLED_SYRINGE | INTRAVENOUS | Status: DC | PRN
  Administered 2023-01-10: 80 ug via INTRAVENOUS

## 2023-01-10 MED ORDER — RANOLAZINE ER 500 MG PO TB12
500.0000 mg | ORAL_TABLET | Freq: Two times a day (BID) | ORAL | Status: DC
  Administered 2023-01-10: 500 mg via ORAL
  Filled 2023-01-10 (×3): qty 1

## 2023-01-10 MED ORDER — LACTATED RINGERS IV SOLN: INTRAVENOUS | Status: DC

## 2023-01-10 MED ORDER — PHENYLEPHRINE HCL-NACL 20-0.9 MG/250ML-% IV SOLN
INTRAVENOUS | Status: DC | PRN
  Administered 2023-01-10: 40 ug/min via INTRAVENOUS

## 2023-01-10 MED ORDER — BACITRACIN ZINC 500 UNIT/GM EX OINT
TOPICAL_OINTMENT | CUTANEOUS | Status: DC | PRN
  Administered 2023-01-10: 1 via TOPICAL

## 2023-01-10 MED ORDER — EZETIMIBE 10 MG PO TABS: 10.0000 mg | ORAL_TABLET | Freq: Every day | ORAL | Status: DC

## 2023-01-10 MED ORDER — CELECOXIB 200 MG PO CAPS
200.0000 mg | ORAL_CAPSULE | Freq: Two times a day (BID) | ORAL | Status: DC
  Administered 2023-01-10: 200 mg via ORAL
  Filled 2023-01-10: qty 1

## 2023-01-10 MED ORDER — SODIUM CHLORIDE 0.9% FLUSH: 3.0000 mL | INTRAVENOUS | Status: DC | PRN

## 2023-01-10 MED ORDER — GABAPENTIN 300 MG PO CAPS
300.0000 mg | ORAL_CAPSULE | ORAL | Status: AC
  Administered 2023-01-10: 300 mg via ORAL
  Filled 2023-01-10: qty 1

## 2023-01-10 SURGICAL SUPPLY — 82 items
ADH SKN CLS APL DERMABOND .7 (GAUZE/BANDAGES/DRESSINGS) ×2
APL SKNCLS STERI-STRIP NONHPOA (GAUZE/BANDAGES/DRESSINGS) ×2
BAG COUNTER SPONGE SURGICOUNT (BAG) ×6 IMPLANT
BAG SPNG CNTER NS LX DISP (BAG) ×4
BASKET BONE COLLECTION (BASKET) ×3 IMPLANT
BENZOIN TINCTURE PRP APPL 2/3 (GAUZE/BANDAGES/DRESSINGS) ×3 IMPLANT
BLADE BONE MILL MEDIUM (MISCELLANEOUS) ×3 IMPLANT
BLADE CLIPPER SURG (BLADE) IMPLANT
BLADE SURG 15 STRL LF DISP TIS (BLADE) ×3 IMPLANT
BLADE SURG 15 STRL SS (BLADE) ×2
BNDG ELASTIC 4X5.8 VLCR STR LF (GAUZE/BANDAGES/DRESSINGS) ×3 IMPLANT
BNDG GAUZE DERMACEA FLUFF 4 (GAUZE/BANDAGES/DRESSINGS) ×3 IMPLANT
BNDG GZE DERMACEA 4 6PLY (GAUZE/BANDAGES/DRESSINGS) ×2
BUR CARBIDE MATCH 3.0 (BURR) ×3 IMPLANT
CABLE BIPOLOR RESECTION CORD (MISCELLANEOUS) ×3 IMPLANT
CANISTER SUCT 3000ML PPV (MISCELLANEOUS) ×6 IMPLANT
CNTNR URN SCR LID CUP LEK RST (MISCELLANEOUS) ×3 IMPLANT
CONT SPEC 4OZ STRL OR WHT (MISCELLANEOUS) ×2
COVER BACK TABLE 60X90IN (DRAPES) ×3 IMPLANT
DERMABOND ADVANCED .7 DNX12 (GAUZE/BANDAGES/DRESSINGS) ×3 IMPLANT
DRAPE C-ARM 42X72 X-RAY (DRAPES) ×6 IMPLANT
DRAPE EXTREMITY T 121X128X90 (DISPOSABLE) ×3 IMPLANT
DRAPE HALF SHEET 40X57 (DRAPES) ×3 IMPLANT
DRAPE LAPAROTOMY 100X72X124 (DRAPES) ×3 IMPLANT
DRAPE SURG 17X23 STRL (DRAPES) ×3 IMPLANT
DRSG EMULSION OIL 3X3 NADH (GAUZE/BANDAGES/DRESSINGS) IMPLANT
DRSG OPSITE POSTOP 4X6 (GAUZE/BANDAGES/DRESSINGS) IMPLANT
DURAPREP 26ML APPLICATOR (WOUND CARE) ×6 IMPLANT
ELECT REM PT RETURN 9FT ADLT (ELECTROSURGICAL) ×2
ELECTRODE REM PT RTRN 9FT ADLT (ELECTROSURGICAL) ×3 IMPLANT
EVACUATOR 1/8 PVC DRAIN (DRAIN) ×3 IMPLANT
GAUZE 4X4 16PLY ~~LOC~~+RFID DBL (SPONGE) ×3 IMPLANT
GAUZE SPONGE 4X4 12PLY STRL (GAUZE/BANDAGES/DRESSINGS) ×3 IMPLANT
GLOVE BIO SURGEON STRL SZ7 (GLOVE) IMPLANT
GLOVE BIO SURGEON STRL SZ8 (GLOVE) ×9 IMPLANT
GLOVE BIOGEL PI IND STRL 6.5 (GLOVE) IMPLANT
GLOVE BIOGEL PI IND STRL 7.0 (GLOVE) IMPLANT
GLOVE BIOGEL PI IND STRL 7.5 (GLOVE) IMPLANT
GLOVE SURG SS PI 6.5 STRL IVOR (GLOVE) IMPLANT
GLOVE SURG SS PI 7.0 STRL IVOR (GLOVE) IMPLANT
GOWN STRL REUS W/ TWL LRG LVL3 (GOWN DISPOSABLE) IMPLANT
GOWN STRL REUS W/ TWL XL LVL3 (GOWN DISPOSABLE) ×6 IMPLANT
GOWN STRL REUS W/TWL 2XL LVL3 (GOWN DISPOSABLE) ×3 IMPLANT
GOWN STRL REUS W/TWL LRG LVL3 (GOWN DISPOSABLE) ×4
GOWN STRL REUS W/TWL XL LVL3 (GOWN DISPOSABLE) ×4
HEMOSTAT POWDER KIT SURGIFOAM (HEMOSTASIS) ×3 IMPLANT
KIT BASIN OR (CUSTOM PROCEDURE TRAY) ×6 IMPLANT
KIT TURNOVER KIT B (KITS) ×6 IMPLANT
MATRIX SPINE STRIP NEOCORE 5CC (Putty) IMPLANT
MILL BONE PREP (MISCELLANEOUS) ×3 IMPLANT
NDL HYPO 25X1 1.5 SAFETY (NEEDLE) ×6 IMPLANT
NEEDLE HYPO 25X1 1.5 SAFETY (NEEDLE) ×4 IMPLANT
NS IRRIG 1000ML POUR BTL (IV SOLUTION) ×6 IMPLANT
PACK BASIC III (CUSTOM PROCEDURE TRAY) ×2
PACK LAMINECTOMY NEURO (CUSTOM PROCEDURE TRAY) ×3 IMPLANT
PACK SRG BSC III STRL LF ECLPS (CUSTOM PROCEDURE TRAY) ×3 IMPLANT
PAD ARMBOARD 7.5X6 YLW CONV (MISCELLANEOUS) ×12 IMPLANT
PUTTY DBM 5CC (Putty) IMPLANT
ROD LORD LIPPED TI 5.5X40 (Rod) IMPLANT
SCREW CANC SHANK MOD 6.5X45 (Screw) IMPLANT
SCREW KODIAK 6.5X45 (Screw) IMPLANT
SCREW POLYAXIAL TULIP (Screw) IMPLANT
SET SCREW (Screw) ×8 IMPLANT
SET SCREW SPNE (Screw) IMPLANT
SPACER IDENTITI 9X9X25 10D (Spacer) IMPLANT
SPONGE SURGIFOAM ABS GEL 100 (HEMOSTASIS) ×3 IMPLANT
SPONGE T-LAP 4X18 ~~LOC~~+RFID (SPONGE) IMPLANT
STOCKINETTE 4X48 STRL (DRAPES) ×3 IMPLANT
STRIP CLOSURE SKIN 1/2X4 (GAUZE/BANDAGES/DRESSINGS) ×6 IMPLANT
SUT ETHILON 4 0 PS 2 18 (SUTURE) ×3 IMPLANT
SUT VIC AB 0 CT1 18XCR BRD8 (SUTURE) ×3 IMPLANT
SUT VIC AB 0 CT1 8-18 (SUTURE) ×2
SUT VIC AB 2-0 CP2 18 (SUTURE) ×3 IMPLANT
SUT VIC AB 3-0 SH 8-18 (SUTURE) ×9 IMPLANT
SYR BULB EAR ULCER 3OZ GRN STR (SYRINGE) ×3 IMPLANT
SYR CONTROL 10ML LL (SYRINGE) ×3 IMPLANT
TOWEL GREEN STERILE (TOWEL DISPOSABLE) ×6 IMPLANT
TOWEL GREEN STERILE FF (TOWEL DISPOSABLE) ×6 IMPLANT
TRAY FOLEY MTR SLVR 16FR STAT (SET/KITS/TRAYS/PACK) ×3 IMPLANT
TUBE CONNECTING 12X1/4 (SUCTIONS) ×3 IMPLANT
UNDERPAD 30X36 HEAVY ABSORB (UNDERPADS AND DIAPERS) ×3 IMPLANT
WATER STERILE IRR 1000ML POUR (IV SOLUTION) ×6 IMPLANT

## 2023-01-10 NOTE — Transfer of Care (Signed)
Immediate Anesthesia Transfer of Care Note  Patient: [name]  Procedure(s) Performed: LUMBAR TWO-THREE POSTERIOR LUMBAR INTERBODY FUSION WITH REMOVAL OF HARDWARE (Back) RIGHT CARPAL TUNNEL RELEASE (Right: Wrist)  Patient Location: PACU  Anesthesia Type:General  Level of Consciousness: awake and drowsy  Airway & Oxygen Therapy: Patient Spontanous Breathing  Post-op Assessment: Report given to RN and Post -op Vital signs reviewed and stable  Post vital signs: Reviewed and stable  Last Vitals:  Vitals Value Taken Time  BP    Temp    Pulse    Resp    SpO2      Last Pain:  Vitals:   01/10/23 0943  TempSrc:   PainSc: 8          Complications: No notable events documented.

## 2023-01-10 NOTE — Anesthesia Procedure Notes (Signed)
Arterial Line Insertion Start/End4/05/2023 10:35 AM, 01/10/2023 10:45 AM Performed by: Maxine Glenn, CRNA, CRNA  Patient location: Pre-op. Preanesthetic checklist: patient identified, IV checked, site marked, risks and benefits discussed, surgical consent, monitors and equipment checked, pre-op evaluation, timeout performed and anesthesia consent Lidocaine 1% used for infiltration Left, radial was placed Catheter size: 20 G Hand hygiene performed  and maximum sterile barriers used   Attempts: 1 Procedure performed without using ultrasound guided technique. Following insertion, dressing applied and Biopatch. Post procedure assessment: normal and unchanged  Patient tolerated the procedure well with no immediate complications.

## 2023-01-10 NOTE — Anesthesia Procedure Notes (Signed)
Procedure Name: Intubation Date/Time: 01/10/2023 10:58 AM  Performed by: Maxine Glenn, CRNAPre-anesthesia Checklist: Patient identified, Emergency Drugs available, Suction available and Patient being monitored Patient Re-evaluated:Patient Re-evaluated prior to induction Oxygen Delivery Method: Circle System Utilized Preoxygenation: Pre-oxygenation with 100% oxygen Induction Type: IV induction Ventilation: Mask ventilation without difficulty Laryngoscope Size: Mac and 4 Grade View: Grade I Tube type: Oral Number of attempts: 1 Airway Equipment and Method: Stylet Placement Confirmation: ETT inserted through vocal cords under direct vision, positive ETCO2 and breath sounds checked- equal and bilateral Secured at: 22 cm Tube secured with: Tape Dental Injury: Teeth and Oropharynx as per pre-operative assessment

## 2023-01-10 NOTE — H&P (Signed)
Subjective: Patient is a 87 y.o. male admitted for R CTS and adjacent level stenosis. Onset of symptoms was a few months ago, gradually worsening since that time.  The pain is rated severe, and is located at the across the lower back and radiates to legs. The pain is described as aching and occurs intermittently. The symptoms have been progressive. Symptoms are exacerbated by exercise, standing, and walking for more than a few minutes. MRI or CT showed adjacent level stenosis. EMGs showed R median neuropathy at the R wrist. He c/o NT in the hand.  Past Medical History:  Diagnosis Date   Adenomatous colon polyp    Ankle edema    Chronic   Asthma    CAD S/P percutaneous coronary angioplasty 2004   a) '04: Staged Taxus DES PCI to RCA (2 Taxus 2.75 x 32 & 12) and Cx-OM (Taxus 3 x 20);;'07 - PCI to pRCA ISR- Cypher DES; c) 05/2014: pPCI pRCA stentISR (Promus DES 3 x 8) OM1 distal to stent (Promus DES 2.75 x 12 - 3.1); 11/'22: distal LCx stent edge 99% -> Onyx Frontier DES 2.5 x 15 - 2.8 (overlaps prior stent)   CAD, multiple vessel 1985   Most recent July 2018: Admitted for non-STEMI. Occluded LAD with patent LIMA (SP1 now occluded).  Patent stents in proximal and mid RCA as well as circumflex-OM 3. Known occlusion of SVG-OM. EF 45-50%   CHF (congestive heart failure)    Cholelithiasis - with cholangitis & choledocholithiasis    status post ERCP with removal of calculi and biliary stent placement.   Chronic anemia    On iron supplement; history of positive guaiac - negative colonoscopy in 1996.; Thought to be related to hemorrhoids; status post hemorrhoidectomy   Chronic back pain     multiple surgeries; C-spine and lumbar   Diabetes mellitus    no meds- pt states he is no longer DM   Diverticulitis of colon 1996   Diverticulosis    Dyslipidemia, goal LDL below 70    Erectile dysfunction    Exertional dyspnea    Chronic baseline SOB with ambulation   GERD (gastroesophageal reflux disease)     Grade II diastolic dysfunction    H/O: pneumonia 11/2012   Hemorrhoids    Hiatal hernia    History of: ST elevation myocardial infarction (STEMI) involving left circumflex coronary artery with complication 1985   PTCA-circumflex; PCI in 1991   Hypertension    Moderate aortic stenosis by prior echocardiogram 07/2015   Normal LV function - EF 55-60%.. Abnormal relaxation. Mild-moderate aortic stenosis (peak/mean gradient 18/10 mmH)   Osteoarthritis of both knees    And back; multiple back surgeries, right knee arthroplasty and left knee arthroscopic surgery x2   Pneumonia 2016   S/P AAA (abdominal aortic aneurysm) repair 08/30/2012   s/p EVAR   S/P CABG x 2 1997   LIMA-LAD, SVG-OM   Sleep apnea    pt. states he was told to return for f/u, to be fitted for Cpap, but pt. reports that he didn't follow up-no cpap used   Statin myopathy 09/30/2015    Past Surgical History:  Procedure Laterality Date   Abdominal and Lower Extremity Arterial Ultrasound  08/23/2012; 10/10/2013   Normal ABIs. Nonocclusive lower extremity disease. 4.2 cm x 4.3 cm infrarenal AAA;; 4.4 cm x 4.3 cm (essentially stable)    ABDOMINAL AORTIC ENDOVASCULAR STENT GRAFT N/A 08/13/2015   Procedure: ABDOMINAL AORTIC ENDOVASCULAR STENT GRAFT;  Surgeon: Pryor Ochoa, MD;  Location: [facility] OR;  Service: Vascular;  Laterality: N/A;   Anterior cervical plating  04/23/2010   At C4-5 and a C6-7 utilizing two separate Biomet MaxAn plates.   ANTERIOR LAT LUMBAR FUSION Left 11/22/2012   Procedure: ANTERIOR LATERAL LUMBAR FUSION 1 LEVEL;  Surgeon: Tia Alert, MD;  Location: [facility] NEURO ORS;  Service: Neurosurgery;  Laterality: Left;  Anterior Lateral Lumbar Fusion Lumbar Three-Four   BACK SURGERY  1979 & x 10   pt. remarks, "I have had about 10 back surgeries"   BILIARY STENT PLACEMENT N/A 07/03/2014   Procedure: BILIARY STENT PLACEMENT;  Surgeon: Louis Meckel, MD;  Location: [facility] ENDOSCOPY;  Service: Endoscopy;  Laterality: N/A;    CARPAL TUNNEL RELEASE Left 11/16/2019   Procedure: LEFT CARPAL TUNNEL RELEASE;  Surgeon: Kathryne Hitch, MD;  Location: [facility] ORS;  Service: Orthopedics;  Laterality: Left;   CATARACT EXTRACTION     Cervical arthrodesis  04/23/2010   Anterior cervical arthrodesis, C4-5, C6-7 utilizing 7-mm PEEK interbody cage packed with local autograft & Antifuse putty at C4-5 & an 8-mm cage at C6-7.   CERVICAL DISCECTOMY  04/23/2010   Decompressive anterior carvical diskectomy. C4-5, C6-7   CHOLECYSTECTOMY N/A 05/23/2015   Procedure: LAPAROSCOPIC CHOLECYSTECTOMY WITH INTRAOPERATIVE CHOLANGIOGRAM;  Surgeon: Ovidio Kin, MD;  Location: [facility] ORS;  Service: General;  Laterality: N/A;   COLONOSCOPY  1996   CORONARY ANGIOPLASTY  1985,1991,15   1985 lateral STEMI Circumflex PTCA;    CORONARY ARTERY BYPASS GRAFT  1997   LIMA-LAD, SVG-OM (SVG known to be occluded prior to 2004)   CORONARY STENT INTERVENTION N/A 08/20/2021   Procedure: CORONARY STENT INTERVENTION;  Surgeon: Runell Gess, MD;  Location: [facility] INVASIVE CV LAB;  Service: Cardiovascular:  dLCx distal Edge 99% => Onyx Frontier DES 2.5 x 15 (2.8 mm) overlaps prior stent distally.   ERCP N/A 07/03/2014   Procedure: ENDOSCOPIC RETROGRADE CHOLANGIOPANCREATOGRAPHY (ERCP);  Surgeon: Louis Meckel, MD;  Location: [facility] ENDOSCOPY;  Service: Endoscopy;  Laterality: N/A;   ERCP N/A 07/05/2014   Procedure: ENDOSCOPIC RETROGRADE CHOLANGIOPANCREATOGRAPHY (ERCP);  Surgeon: Louis Meckel, MD;  Location: [facility] ENDOSCOPY;  Service: Endoscopy;  Laterality: N/A;   ERCP N/A 06/30/2015   Procedure: ENDOSCOPIC RETROGRADE CHOLANGIOPANCREATOGRAPHY (ERCP);  Surgeon: Meryl Dare, MD;  Location: [facility] ENDOSCOPY;  Service: Endoscopy;  Laterality: N/A;   EYE SURGERY     IR ANGIOGRAM EXTREMITY LEFT  01/18/2017   IR ANGIOGRAM PELVIS SELECTIVE OR SUPRASELECTIVE  01/18/2017   IR ANGIOGRAM SELECTIVE EACH ADDITIONAL VESSEL  01/18/2017   IR AORTAGRAM ABDOMINAL SERIALOGRAM   01/18/2017   IR EMBO ARTERIAL NOT HEMORR HEMANG INC GUIDE ROADMAPPING  01/18/2017   IR RADIOLOGIST EVAL & MGMT  01/04/2017   IR RADIOLOGIST EVAL & MGMT  10/26/2017   IR RADIOLOGIST EVAL & MGMT  05/24/2018   IR RADIOLOGIST EVAL & MGMT  06/19/2019   IR RADIOLOGIST EVAL & MGMT  06/11/2020   IR RADIOLOGIST EVAL & MGMT  07/02/2021   IR RADIOLOGIST EVAL & MGMT  06/22/2022   IR US GUIDE VASC ACCESS RIGHT  01/18/2017   KNEE ARTHROSCOPY Left    x 2   LEFT HEART CATH AND CORS/GRAFTS ANGIOGRAPHY  06/2003   None Occluded vein graft to OM; diffuse RCA disease in the mid vessel, 80% circumflex-OM stenosis; follow on AV groove circumflex with sequential 90% stenoses and intervening saccular dilation    LEFT HEART CATH AND CORS/GRAFTS ANGIOGRAPHY  12/2008   4 abnormal Myoview showing apical thinning (possibly  due to apical LAD 95%) : 100% Occluded LAD after SV1, distal LAD grafted via LIMA -apical 95% . Cx -OM1 w/patent stent extending into OM 1 . Follow on Cx - 70-80% - non-amenable PCI. RCA widely patent 3 overlapping stents in mRCA w/less than 40% stenosisin RPL; SVG-OM known occluded    LEFT HEART CATH AND CORS/GRAFTS ANGIOGRAPHY N/A 04/20/2017   Procedure: Left Heart Cath and Cors/Grafts Angiography;  Surgeon: Runell Gess, MD;  Location: [facility] INVASIVE CV LAB;  LAD now occluded prior to SP1.LIMA-LAD. Patent RCA and circumflex stents. EF 45-50%. Relatively stable.    LEFT HEART CATH AND CORS/GRAFTS ANGIOGRAPHY N/A 08/20/2021   Procedure: LEFT HEART CATH AND CORS/GRAFTS ANGIOGRAPHY;  Surgeon: Runell Gess, MD;  Location: [facility] INVASIVE CV LAB;  Service: Cardiovascular: CULPRIT 99% dLCx just after stent (Overlapping DES PCI) - otw patent LCx stent, patent RCA stent & Patent LIMA-LAD with CTO of prox LAD.   LEFT HEART CATHETERIZATION WITH CORONARY ANGIOGRAM N/A 05/15/2014   Procedure: LEFT HEART CATHETERIZATION WITH CORONARY ANGIOGRAM;  Surgeon: Lesleigh Noe, MD;  Location: [facility] CATH LAB;  Service:  Cardiovascular;  Laterality: N/A;   LEFT HEART CATHETERIZATION WITH CORONARY/GRAFT ANGIOGRAM N/A 06/28/2014   Procedure: LEFT HEART CATHETERIZATION WITH Isabel Caprice;  Surgeon: Lennette Bihari, MD;  Location: [facility] CATH LAB;  Service: Cardiovascular;  Laterality: N/A;   LUMBAR PERCUTANEOUS PEDICLE SCREW 1 LEVEL N/A 11/22/2012   Procedure: LUMBAR PERCUTANEOUS PEDICLE SCREW 1 LEVEL;  Surgeon: Tia Alert, MD;  Location: [facility] NEURO ORS;  Service: Neurosurgery;  Laterality: N/A;  Lumbar Three-Four Percutaneous Pedicle Screw, Lateral approach   MINOR HEMORRHOIDECTOMY     NM MYOVIEW LTD  05/26/2021   Lexiscan: Intermediate risk (similar findings to last study), with exception of reduced EF roughly 40%..  Prior basal inferior-inferolateral infarct without peri-infarct ischemia.   ->  Echo ordered -> (EF 45 to 50%)-when compared to prior Myoview, infarct appears similar.   PERCUTANEOUS CORONARY STENT INTERVENTION (PCI-S)  06/2003   PCI - RCA 2 overlapping Taxus DES 2.75 mm x 32 mm and 2.75 mm x 12 mm (3.0 mm); PCI-Cx-OM1 - Taxus DES 3.0 mm x 20 mm (3.1 mm);    PERCUTANEOUS CORONARY STENT INTERVENTION (PCI-S)  12/2005   80% ISR in proximal Taxus stent in RCA -- covered proximally with Cypher DES 3.0 mm x 12 mm   PERCUTANEOUS CORONARY STENT INTERVENTION (PCI-S) N/A 05/17/2014   Procedure: PERCUTANEOUS CORONARY STENT INTERVENTION (PCI-S);  Surgeon: Lesleigh Noe, MD;  Location: [facility] CATH LAB: PCI pRCA stent ISR - Promus DES 3.0 x 8 (3.25), OM distal stent edge - Promus DES 2.75 x 12 (3.1)   PERIPHERAL VASCULAR CATHETERIZATION  11/03/2016   Procedure: Embolization;  Surgeon: Maeola Harman, MD;  Location: [facility] INVASIVE CV LAB;  Service: Cardiovascular;;   POSTERIOR CERVICAL FUSION/FORAMINOTOMY N/A 05/02/2013   Procedure: POSTERIOR CERVICAL FUSION/FORAMINOTOMY CERVICAL SEVEN THORACIC-ONE;  Surgeon: Tia Alert, MD;  Location: [facility] NEURO ORS;  Service: Neurosurgery;  Laterality: N/A;  POSTERIOR  CERVICAL FUSION/FORAMINOTOMY CERVICAL SEVEN THORACIC-ONE   SPHINCTEROTOMY N/A 06/30/2015   Procedure: SPHINCTEROTOMY;  Surgeon: Meryl Dare, MD;  Location: [facility] ENDOSCOPY;  Service: Endoscopy;  Laterality: N/A;   TOTAL KNEE ARTHROPLASTY Right    TOTAL KNEE ARTHROPLASTY Left 12/20/2016   Procedure: LEFT TOTAL KNEE ARTHROPLASTY;  Surgeon: Dannielle Huh, MD;  Location: [facility] OR;  Service: Orthopedics;  Laterality: Left;   TRANSTHORACIC ECHOCARDIOGRAM  06/10/2021   EF 45 to 50%.  Mildly  reduced function.  Mild hypokinesis of basal-mid inferior and inferolateral wall consistent with prior infarct.  Severe LA dilation.  Mild to moderate MR with moderate MAC.  Mild calcific AS (mean gradient 11 mmHg) --> LV function seems unchanged.  Diastolic function improved.  Wall motion abnormality unchanged.  AS unchanged.   TRANSTHORACIC ECHOCARDIOGRAM  07/2018   Normal LV size.  EF 50 to 55% with mild HK of inferolateral wall.  GRII DD.  Mild AS with mean gradient 15 mm or greater.  Mild ascending aortic dilation.  Mildly increased PA pressures of 41 mmHg   UPPER GI ENDOSCOPY  07/03/2019   VISCERAL ANGIOGRAM  11/03/2016   Procedure: Visceral Angiogram;  Surgeon: Maeola HarmanBrandon Christopher Cain, MD;  [facility];  Service: Cardiovascular;;    Prior to Admission medications   Medication Sig Start Date End Date Taking? Authorizing Provider  acetaminophen (TYLENOL) 500 MG tablet Take 1,000 mg by mouth every 6 (six) hours as needed for mild pain, moderate pain or headache. Reported on 03/16/2016   Yes [provider]  amLODipine (NORVASC) 5 MG tablet Take 1 tablet by mouth once daily 09/22/22  Yes Marykay LexHarding, Erie Radu W, MD  Choline Fenofibrate (FENOFIBRIC ACID) 135 MG CPDR TAKE 1 CAPSULE BY MOUTH EVERY DAY 04/08/22  Yes Marykay LexHarding, Deronte Solis W, MD  clopidogrel (PLAVIX) 75 MG tablet Take 1 tablet (75 mg total) by mouth daily. 02/15/22  Yes Marykay LexHarding, Acasia Skilton W, MD  ezetimibe (ZETIA) 10 MG tablet Take 1 tablet (10 mg  total) by mouth daily. 10/28/22  Yes Marykay LexHarding, Pelham Hennick W, MD  Ferrous Sulfate (IRON PO) Take 65 mg by mouth daily.   Yes [provider]  furosemide (LASIX) 20 MG tablet Take 1 tablet (20 mg total) by mouth daily as needed. 02/15/22  Yes Marykay LexHarding, Quantavia Frith W, MD  Multiple Vitamins-Minerals (MULTIVITAMIN WITH MINERALS) tablet Take 1 tablet by mouth daily.   Yes [provider]  pantoprazole (PROTONIX) 40 MG tablet Take 1 tablet by mouth once daily 09/01/22  Yes Marykay LexHarding, Margel Joens W, MD  ranolazine (RANEXA) 500 MG 12 hr tablet Take 1 tablet by mouth twice daily 11/15/22  Yes Marykay LexHarding, Rob Mciver W, MD  apixaban (ELIQUIS) 2.5 MG TABS tablet Take 1 tablet (2.5 mg total) by mouth 2 (two) times daily. 12/29/22   Swinyer, Zachary GeorgeMichelle M, NP  apixaban (ELIQUIS) 2.5 MG TABS tablet Take 1 tablet (2.5 mg total) by mouth 2 (two) times daily. 12/29/22   Swinyer, Zachary GeorgeMichelle M, NP  Bempedoic Acid (NEXLETOL) 180 MG TABS Take 180 mg by mouth daily. Patient not taking: Reported on 12/28/2022 03/18/22   Marykay LexHarding, Khailee Mick W, MD  glucose blood [facility](ONETOUCH VERIO) test strip 1 each by Other route as needed for other. Use as instructed 06/01/22   Ronnald NianLalonde, John C, MD  sildenafil (VIAGRA) 100 MG tablet TAKE ONE TABLET BY MOUTH DAILY AS NEEDED FOR ERECTILE DYSFUNCTION 10/27/21   Ronnald NianLalonde, John C, MD  valsartan-hydrochlorothiazide (DIOVAN-HCT) 320-12.5 MG tablet Take 1 tablet by mouth once daily 11/05/22   Ronnald NianLalonde, John C, MD   Allergies  Allergen Reactions   Oxycodone Shortness Of Breath and Cough   Lisinopril Cough   Statins Other (See Comments)    MYALGIAS    Welchol [Colesevelam Hcl] Itching    Social History   Tobacco Use   Smoking status: Former    Packs/day: 3.00    Years: 45.00    Additional pack years: 0.00    Total pack years: 135.00    Types: Cigarettes  Quit date: 10/05/1983    Years since quitting: 39.2   Smokeless tobacco: Never  Substance Use Topics   Alcohol use: Yes    Alcohol/week: 7.0 - 14.0 standard drinks of  alcohol    Types: 7 - 14 Cans of beer per week    Comment: 1-2 beers per day    Family History  Problem Relation Age of Onset   Arthritis Mother    Diabetes Father    Heart disease Father    Stroke Sister    Hypertension Sister    Heart disease Sister    Diabetes Sister    Breast cancer Sister    Heart disease Brother    Ulcers Brother    Colon cancer Neg Hx      Review of Systems  Positive ROS: neg  All other systems have been reviewed and were otherwise negative with the exception of those mentioned in the HPI and as above.  Objective: Vital signs in last 24 hours: Temp:  [97.8 F (36.6 C)] 97.8 F (36.6 C) (04/08 0928) Pulse Rate:  [76] 76 (04/08 0928) Resp:  [18] 18 (04/08 0928) BP: (181)/(78) 181/78 (04/08 0928) SpO2:  [100 %] 100 % (04/08 0928) Weight:  [95.3 kg] 95.3 kg (04/08 0928)  General Appearance: Alert, cooperative, no distress, appears stated age Head: Normocephalic, without obvious abnormality, atraumatic Eyes: PERRL, conjunctiva/corneas clear, EOM's intact    Neck: Supple, symmetrical, trachea midline Back: Symmetric, no curvature, ROM normal, no CVA tenderness Lungs:  respirations unlabored Heart: Regular rate and rhythm Abdomen: Soft, non-tender Extremities: Extremities normal, atraumatic, no cyanosis or edema Pulses: 2+ and symmetric all extremities Skin: Skin color, texture, turgor normal, no rashes or lesions  NEUROLOGIC:   Mental status: Alert and oriented x4,  no aphasia, good attention span, fund of knowledge, and memory Motor Exam - grossly normal Sensory Exam - grossly normal Reflexes: 1+ Coordination - grossly normal Gait -not tested Balance -not tested Cranial Nerves: I: smell Not tested  II: visual acuity  OS: nl    OD: nl  II: visual fields Full to confrontation  II: pupils Equal, round, reactive to light  III,VII: ptosis None  III,IV,VI: extraocular muscles  Full ROM  V: mastication Normal  V: facial light touch  sensation  Normal  V,VII: corneal reflex  Present  VII: facial muscle function - upper  Normal  VII: facial muscle function - lower Normal  VIII: hearing Not tested  IX: soft palate elevation  Normal  IX,X: gag reflex Present  XI: trapezius strength  5/5  XI: sternocleidomastoid strength 5/5  XI: neck flexion strength  5/5  XII: tongue strength  Normal    Data Review Lab Results  Component Value Date   WBC 5.6 12/29/2022   HGB 12.3 (L) 12/29/2022   HCT 38.9 (L) 12/29/2022   MCV 98.0 12/29/2022   PLT 189 12/29/2022   Lab Results  Component Value Date   NA 139 12/29/2022   K 4.6 12/29/2022   CL 102 12/29/2022   CO2 23 12/29/2022   BUN 24 (H) 12/29/2022   CREATININE 1.60 (H) 12/29/2022   GLUCOSE 99 12/29/2022   Lab Results  Component Value Date   INR 1.1 12/29/2022    Assessment/Plan:  Estimated body mass index is 31.01 kg/m as calculated from the following:   Height as of this encounter: 5\' 9"  (1.753 m).   Weight as of this encounter: 95.3 kg. Patient admitted for R CTR and PLIF L2-3. Patient has failed a  reasonable attempt at conservative therapy.  I explained the condition and procedure to the patient and answered any questions.  Patient wishes to proceed with procedure as planned. Understands risks/ benefits and typical outcomes of procedure.   Tia Alert 01/10/2023 10:19 AM

## 2023-01-10 NOTE — Op Note (Signed)
01/10/2023  1:54 PM  PATIENT:  [name]  87 y.o. 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 Decompressive lumbar laminectomy, hemi facetectomy and foraminotomies L2-3 requiring more work than would be required for a simple exposure of the disk for PLIF in order to adequately decompress the neural elements and address the spinal stenosis 2. Posterior lumbar interbody fusion L2-3 using porous titanium interbody cages packed with morcellized allograft and autograft  3. Posterior fixation L2-3 using ATEC cortical pedicle screws.  4. Intertransverse arthrodesis L2-3 using morcellized autograft and allograft. 5.  Exploration of fusion L3-4 with removal of nonsegmental pedicle screw fixation 6.  Right carpal tunnel release at the wrist  SURGEON:  Marikay Alar, MD  ASSISTANTS: Verlin Dike, FNP  ANESTHESIA:  General  EBL: 300 ml  Total I/O In: 1500 [I.V.:1400; IV Piggyback:100] Out: 675 [Urine:375; Blood:300]  BLOOD ADMINISTERED:none  DRAINS: none   INDICATION FOR PROCEDURE: This patient presented with right hand pain and numbness and tingling and severe back pain with neurogenic claudication. 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 The patient tried a reasonable attempt at conservative medical measures without relief. I recommended decompression and instrumented fusion to address the stenosis as well as the segmental  instability the right carpal tunnel release.  Patient understood the risks, benefits, and alternatives and potential outcomes and wished to proceed.  PROCEDURE DETAILS:  The patient was brought to the operating room. After induction of generalized endotracheal anesthesia, the right arm was extended on an  armboard and prepped circumferentially from the fingertips to the elbow with Betadine scrub and DuraPrep.  It was draped in the usual sterile fashion and 5 cc of local anesthesia was injected and a small palmar incision was made from the distal wrist crease into the palm in line with the webspace between the third and fourth digits.  I dissected down through the palmar fascia to expose the transverse carpal ligament which was opened with 15 blade scalpel to expose the underlying median nerve.  I then spread between the nerve and the ligament both proximally and distally with a mosquito and completely transected the ligament both proximally and distally until the nerve was free.  I then palpated both proximally and distally to make sure there the nerve was free.  Irrigated with saline solution.  I dried all bleeding points.  I closed the palmar fascia with a single 3-0 Vicryl in the subcuticular tissue with 3-0 Vicryl.  I then closed the skin with interrupted 4-0 Ethilon vertical mattress sutures.  The hand was then cleaned and a sterile dressing was applied and the hand was wrapped in a Kerlix and an Ace bandage.   the patient was rolled into the prone position on chest rolls and all pressure points were padded. The patient's lumbar region was cleaned and then prepped with DuraPrep and draped in the usual sterile fashion. Anesthesia was injected and then a dorsal midline incision was made and carried down to the lumbosacral fascia. The fascia was opened and the paraspinous musculature was taken down in a subperiosteal fashion to expose L2-3 as well as the previously placed instrumentation on the left.. A self-retaining retractor was placed.  Remove the locking caps from the old pedicle screws at L3-4 and remove the rod.  I then  pulled on each screw and both screws moved suggesting arthrodesis at L3-4.  Intraoperative fluoroscopy confirmed my level, and I started with placement of the L2 cortical pedicle screws.  The pedicle screw entry zones were identified utilizing surface landmarks and  AP and lateral fluoroscopy. I scored the cortex with the high-speed drill and then used the hand drill to drill an upward and outward direction into the pedicle. I then tapped line to line. I then placed a 6.5 x 45 mm cortical pedicle screw into the pedicles of L2 bilaterally.    I then turned my attention to the decompression and complete lumbar laminectomies, hemi- facetectomies, and foraminotomies were performed at L2-3.  My nurse practitioner was directly involved in the decompression and exposure of the neural elements. the patient had significant spinal stenosis and this required more work than would be required for a simple exposure of the disc for posterior lumbar interbody fusion which would only require a limited laminotomy. Much more generous decompression and generous foraminotomy was undertaken in order to adequately decompress the neural elements and address the patient's leg pain. The yellow ligament was removed to expose the underlying dura and nerve roots, and generous foraminotomies were performed to adequately decompress the neural elements. Both the exiting and traversing nerve roots were decompressed on both sides until a coronary dilator passed easily along the nerve roots. Once the decompression was complete, I turned my attention to the posterior lower lumbar interbody fusion. The epidural venous vasculature was coagulated and cut sharply. Disc space was incised and the initial discectomy was performed with pituitary rongeurs. The disc space was distracted with sequential distractors to a height of 9 mm. We then used a series of scrapers and shavers to prepare the endplates for fusion. The midline was prepared with Epstein curettes. Once the complete discectomy was finished, we packed an appropriate sized interbody cage with local autograft and morcellized allograft, gently retracted the nerve root, and tapped  the cage into position at L2-3.  The midline between the cages was packed with morselized autograft and allograft.   We then turned our attention to the placement of the lower pedicle screws. The pedicle screw entry zones were identified utilizing surface landmarks and fluoroscopy. I drilled into each pedicle utilizing the hand drill, and tapped each pedicle with the appropriate tap. We palpated with a ball probe to assure no break in the cortex. We then placed 6.5 x 45 mm pedicle screws into the pedicles bilaterally at L3.  My nurse practitioner assisted in placement of the pedicle screws.  We then decorticated the transverse processes and laid a mixture of morcellized autograft and allograft out over these to perform intertransverse arthrodesis at L2-3. We then placed lordotic rods into the multiaxial screw heads of the pedicle screws and locked these in position with the locking caps and anti-torque device. We then checked our construct with AP and lateral fluoroscopy. Irrigated with copious amounts of bacitracin-containing saline solution. Inspected the nerve roots once again to assure adequate decompression, lined to the dura with Gelfoam,  and then we closed the muscle and the fascia with 0 Vicryl. Closed the subcutaneous tissues with 2-0 Vicryl and subcuticular tissues with 3-0 Vicryl. The skin was closed with benzoin and Steri-Strips. Dressing was then applied, the patient was awakened from general anesthesia and transported to the recovery room in stable condition. At the end of the procedure all sponge, needle and instrument counts were correct.   PLAN OF CARE: admit to inpatient  PATIENT DISPOSITION:  PACU - hemodynamically stable.   Delay start of Pharmacological VTE agent (>24hrs) due to surgical blood loss or risk of bleeding:  yes

## 2023-01-11 ENCOUNTER — Encounter (HOSPITAL_COMMUNITY): Payer: Self-pay | Admitting: Neurological Surgery

## 2023-01-11 MED ORDER — HYDROCODONE-ACETAMINOPHEN 5-325 MG PO TABS: 1.0000 | ORAL_TABLET | ORAL | 0 refills | Status: DC | PRN

## 2023-01-11 MED ORDER — METHOCARBAMOL 750 MG PO TABS: 750.0000 mg | ORAL_TABLET | Freq: Four times a day (QID) | ORAL | 0 refills | Status: DC

## 2023-01-11 NOTE — TOC Transition Note (Signed)
Transition of Care [facility]) - CM/SW Discharge Note   Patient Details  Name: [name] MRN: [id number] Date of Birth: Nov 23, 1935  Transition of Care [facility]) CM/SW Contact:  Kermit Balo, RN Phone Number: 01/11/2023, 10:37 AM   Clinical Narrative:    Pt is discharging home with home health services through [facility] home health. Information on the AVS.  Any needed DME will be obtained by bedside RN.  Pt has transportation home.   Final next level of care: Home w Home Health Services Barriers to Discharge: No Barriers Identified   Patient Goals and CMS Choice CMS Medicare.gov Compare Post Acute Care list provided to:: Patient Choice offered to / list presented to : Patient  Discharge Placement                         Discharge Plan and Services Additional resources added to the After Visit Summary for                            [facility] Arranged: PT HH Agency: [facility] Date [facility] Agency Contacted: 01/11/23   Representative spoke with at [facility] Agency: Tresa Endo  Social Determinants of Health (SDOH) Interventions SDOH Screenings   Depression (PHQ2-9): Low Risk  (11/02/2022)  Tobacco Use: Medium Risk (01/10/2023)     Readmission Risk Interventions     No data to display

## 2023-01-11 NOTE — Evaluation (Signed)
Physical Therapy Evaluation  Patient Details Name: [name] MRN: [id number] DOB: 01-25-1936 Today's Date: 01/11/2023  History of Present Illness  Pt is an 87 y/o M who presents s/p L2-L3 PLIF on 01/10/2023.  PMH significant for B TKA, AAA, CAD, DM, HTN.  Clinical Impression  Pt admitted with above diagnosis. At the time of PT eval, pt was able to demonstrate transfers and ambulation with gross min-mod assist and RW for support. Pt was educated on precautions, brace application/wearing schedule, appropriate activity progression, and car transfer. Pt currently with functional limitations due to the deficits listed below (see PT Problem List). Pt will benefit from skilled PT to increase their independence and safety with mobility to allow discharge to the venue listed below.         Recommendations for follow up therapy are one component of a multi-disciplinary discharge planning process, led by the attending physician.  Recommendations may be updated based on patient status, additional functional criteria and insurance authorization.  Follow Up Recommendations       Assistance Recommended at Discharge Frequent or constant Supervision/Assistance  Patient can return home with the following  A little help with walking and/or transfers;A little help with bathing/dressing/bathroom;Assistance with cooking/housework;Assist for transportation;Help with stairs or ramp for entrance    Equipment Recommendations None recommended by PT  Recommendations for Other Services       Functional Status Assessment Patient has had a recent decline in their functional status and demonstrates the ability to make significant improvements in function in a reasonable and predictable amount of time.     Precautions / Restrictions Precautions Precautions: Back;Fall Precaution Booklet Issued: Yes (comment) Precaution Comments: verbalized understanding Required Braces or Orthoses: Spinal Brace Spinal Brace:  Lumbar corset;Applied in sitting position;Applied in standing position Restrictions Weight Bearing Restrictions: No      Mobility  Bed Mobility Overal bed mobility: Needs Assistance Bed Mobility: Sidelying to Sit   Sidelying to sit: Supervision            Transfers Overall transfer level: Needs assistance Equipment used: Rolling walker (2 wheels) Transfers: Sit to/from Stand Sit to Stand: Min assist           General transfer comment: Assist for power up to full stand and to gain/maintain standing balance.    Ambulation/Gait Ambulation/Gait assistance: Min assist Gait Distance (Feet): 100 Feet Assistive device: Rolling walker (2 wheels) Gait Pattern/deviations: Step-through pattern, Decreased stride length, Trunk flexed Gait velocity: Decreased Gait velocity interpretation: <1.8 ft/sec, indicate of risk for recurrent falls   General Gait Details: VC's throughout for improved posture, closer walker proximity, and forward gaze. Pt unsteady throughout and with difficulty advancing feet with adequate floor clearance.  Stairs Stairs: Yes Stairs assistance: Min assist, Mod assist, +2 safety/equipment Stair Management: Two rails, Forwards, Step to pattern Number of Stairs: 10 General stair comments: VC's for sequencing and general safety. Assist for balance and power up to next step. Second person present throughout for safety and guarding.  Wheelchair Mobility    Modified Rankin (Stroke Patients Only)       Balance Overall balance assessment: Needs assistance Sitting-balance support: Feet supported Sitting balance-Leahy Scale: Good     Standing balance support: Reliant on assistive device for balance Standing balance-Leahy Scale: Poor                               Pertinent Vitals/Pain Pain Assessment Pain Assessment: Faces Faces Pain Scale:  Hurts a little bit Pain Location: low back Pain Descriptors / Indicators: Tender Pain  Intervention(s): Limited activity within patient's tolerance, Monitored during session, Repositioned    Home Living Family/patient expects to be discharged to:: Private residence Living Arrangements: Spouse/significant other Available Help at Discharge: Family;Available 24 hours/day Type of Home: House Home Access: Stairs to enter Entrance Stairs-Rails: Right Entrance Stairs-Number of Steps: 2 Alternate Level Stairs-Number of Steps: Basement with TV and recliner Home Layout: Multi-level;Able to live on main level with bedroom/bathroom Home Equipment: Rolling Walker (2 wheels);BSC/3in1;Adaptive equipment;Cane - single point      Prior Function Prior Level of Function : Independent/Modified Independent;Driving             Mobility Comments: Walks with a SPC in the community ADLs Comments: Assist with socks and shoes, and pt has been assisting wife with her socks and shoes.     Hand Dominance   Dominant Hand: Right    Extremity/Trunk Assessment   Upper Extremity Assessment Upper Extremity Assessment: Defer to OT evaluation RUE Deficits / Details: CTR with ACE wrap. RUE Sensation: WNL RUE Coordination: WNL    Lower Extremity Assessment Lower Extremity Assessment: Generalized weakness    Cervical / Trunk Assessment Cervical / Trunk Assessment: Back Surgery  Communication   Communication: No difficulties  Cognition Arousal/Alertness: Awake/alert Behavior During Therapy: WFL for tasks assessed/performed Overall Cognitive Status: Within Functional Limits for tasks assessed                                          General Comments      Exercises     Assessment/Plan    PT Assessment Patient needs continued PT services  PT Problem List Decreased strength;Decreased activity tolerance;Decreased balance;Decreased mobility;Decreased knowledge of use of DME;Decreased safety awareness;Decreased coordination;Decreased knowledge of precautions;Pain        PT Treatment Interventions DME instruction;Gait training;Stair training;Functional mobility training;Therapeutic activities;Therapeutic exercise;Balance training;Patient/family education    PT Goals (Current goals can be found in the Care Plan section)  Acute Rehab PT Goals Patient Stated Goal: Return home, be able to get down into his man cave PT Goal Formulation: With patient Time For Goal Achievement: 01/18/23 Potential to Achieve Goals: Good    Frequency Min 5X/week     Co-evaluation               AM-PAC PT "6 Clicks" Mobility  Outcome Measure Help needed turning from your back to your side while in a flat bed without using bedrails?: A Little Help needed moving from lying on your back to sitting on the side of a flat bed without using bedrails?: A Little Help needed moving to and from a bed to a chair (including a wheelchair)?: A Little Help needed standing up from a chair using your arms (e.g., wheelchair or bedside chair)?: A Little Help needed to walk in hospital room?: A Little Help needed climbing 3-5 steps with a railing? : A Little 6 Click Score: 18    End of Session Equipment Utilized During Treatment: Gait belt;Back brace Activity Tolerance: Patient tolerated treatment well Patient left: in bed;with call bell/phone within reach Nurse Communication: Mobility status PT Visit Diagnosis: Unsteadiness on feet (R26.81);Pain Pain - part of body:  (back)    Time: [id number] PT Time Calculation (min) (ACUTE ONLY): 15 min   Charges:   PT Evaluation $PT Eval Low Complexity: 1 Low  Conni Slipper, PT, DPT Acute Rehabilitation Services Secure Chat Preferred Office: [phone]   Marylynn Pearson 01/11/2023, 12:49 PM

## 2023-01-11 NOTE — Anesthesia Postprocedure Evaluation (Signed)
Anesthesia Post Note  Patient: DAIMIEN VANDERZEE  Procedure(s) Performed: LUMBAR TWO-THREE POSTERIOR LUMBAR INTERBODY FUSION WITH REMOVAL OF HARDWARE (Back) RIGHT CARPAL TUNNEL RELEASE (Right: Wrist)     Patient location during evaluation: Other Anesthesia Type: General Level of consciousness: awake and alert Pain management: pain level controlled Vital Signs Assessment: post-procedure vital signs reviewed and stable Respiratory status: spontaneous breathing, nonlabored ventilation and respiratory function stable Cardiovascular status: blood pressure returned to baseline and stable Postop Assessment: no apparent nausea or vomiting Anesthetic complications: no  No notable events documented.  Last Vitals:  Vitals:   01/11/23 0504 01/11/23 0731  BP: (!) 154/66 136/69  Pulse: 64 (!) 52  Resp: 20 16  Temp: (!) 36.4 C 36.5 C  SpO2: 100% 98%    Last Pain:  Vitals:   01/11/23 0731  TempSrc: Oral  PainSc:                  Rosealee Recinos,W. EDMOND

## 2023-01-11 NOTE — Evaluation (Signed)
Occupational Therapy Evaluation Patient Details Name: [name] MRN: [id number] DOB: May 06, 1936 Today's Date: 01/11/2023   History of Present Illness 87 yo M s/p PLIF.  PMH includes: B TKA, AAA, CAD, DM, HTN.   Clinical Impression   Patient admitted for the procedure above.  PTA he lives with his spouse, who had a hip replacement recently.  The spouse can provide supportive assist, but not physical assist.  The patient presents as unsteady, although he is saying he is close to his baseline.  At baseline he uses a SPC in the community, but not in the home, and did need Min A with shoes and socks.  The patient has a long handled reacher and sock aide, and was able to use them during ADL this am.  He needed Min A for sit to stand, Min Guard for in room mobility at RW level, and Min A for lower body ADL.  OT discussed shower transfer, use of his 3n1 to sit on during bathing, and would benefit from using his RW initially in and out of the home.  Precautions reviewed and brace management discussed.  Patient with no further questions, and PT Consult pending.  Patient had a CTR to his left hand, and understood his exercises and elevation to reduce swelling.  No further acute OT needs.        Recommendations for follow up therapy are one component of a multi-disciplinary discharge planning process, led by the attending physician.  Recommendations may be updated based on patient status, additional functional criteria and insurance authorization.   Assistance Recommended at Discharge Intermittent Supervision/Assistance  Patient can return home with the following Assist for transportation;Assistance with cooking/housework;A little help with bathing/dressing/bathroom    Functional Status Assessment  Patient has had a recent decline in their functional status and demonstrates the ability to make significant improvements in function in a reasonable and predictable amount of time.  Equipment  Recommendations  None recommended by OT    Recommendations for Other Services       Precautions / Restrictions Precautions Precautions: Back;Fall Precaution Booklet Issued: Yes (comment) Precaution Comments: verbalized understanding Required Braces or Orthoses: Spinal Brace Spinal Brace: Lumbar corset;Applied in sitting position;Applied in standing position Restrictions Weight Bearing Restrictions: No      Mobility Bed Mobility Overal bed mobility: Needs Assistance Bed Mobility: Sidelying to Sit   Sidelying to sit: Supervision            Transfers Overall transfer level: Needs assistance   Transfers: Sit to/from Stand, Bed to chair/wheelchair/BSC Sit to Stand: Min guard     Step pivot transfers: Min assist, Min guard            Balance Overall balance assessment: Needs assistance Sitting-balance support: Feet supported Sitting balance-Leahy Scale: Good     Standing balance support: Reliant on assistive device for balance Standing balance-Leahy Scale: Poor                             ADL either performed or assessed with clinical judgement   ADL       Grooming: Wash/dry hands;Supervision/safety;Standing               Lower Body Dressing: Minimal assistance;Sit to/from stand   Toilet Transfer: Min guard;Rolling walker (2 wheels);Regular Toilet                   Vision Patient Visual Report: No change from baseline  Perception     Praxis      Pertinent Vitals/Pain Pain Assessment Pain Assessment: Faces Faces Pain Scale: Hurts a little bit Pain Location: low back Pain Descriptors / Indicators: Tender Pain Intervention(s): Monitored during session     Hand Dominance Right   Extremity/Trunk Assessment Upper Extremity Assessment Upper Extremity Assessment: RUE deficits/detail RUE Deficits / Details: CTR with ACE wrap. RUE Sensation: WNL RUE Coordination: WNL   Lower Extremity Assessment Lower Extremity  Assessment: Defer to PT evaluation   Cervical / Trunk Assessment Cervical / Trunk Assessment: Back Surgery   Communication Communication Communication: No difficulties   Cognition Arousal/Alertness: Awake/alert Behavior During Therapy: WFL for tasks assessed/performed Overall Cognitive Status: Within Functional Limits for tasks assessed                                       General Comments   VSS on RA    Exercises     Shoulder Instructions      Home Living Family/patient expects to be discharged to:: Private residence Living Arrangements: Spouse/significant other Available Help at Discharge: Family;Available 24 hours/day Type of Home: House Home Access: Stairs to enter Entergy CorporationEntrance Stairs-Number of Steps: 2 Entrance Stairs-Rails: Right Home Layout: Multi-level;Able to live on main level with bedroom/bathroom Alternate Level Stairs-Number of Steps: Basement with TV and recliner   Bathroom Shower/Tub: Producer, television/film/videoWalk-in shower   Bathroom Toilet: Standard Bathroom Accessibility: Yes How Accessible: Accessible via walker Home Equipment: Rolling Walker (2 wheels);BSC/3in1;Adaptive equipment;Cane - single point Adaptive Equipment: Reacher        Prior Functioning/Environment Prior Level of Function : Independent/Modified Independent;Driving             Mobility Comments: Walks with a SPC in the community ADLs Comments: Assist with socks and shoes        OT Problem List: Impaired balance (sitting and/or standing);Decreased activity tolerance;Pain      OT Treatment/Interventions:      OT Goals(Current goals can be found in the care plan section) Acute Rehab OT Goals Patient Stated Goal: Return home OT Goal Formulation: With patient Time For Goal Achievement: 01/14/23 Potential to Achieve Goals: Good  OT Frequency:      Co-evaluation              AM-PAC OT "6 Clicks" Daily Activity     Outcome Measure Help from another person eating meals?:  None Help from another person taking care of personal grooming?: A Little Help from another person toileting, which includes using toliet, bedpan, or urinal?: A Little Help from another person bathing (including washing, rinsing, drying)?: A Little Help from another person to put on and taking off regular upper body clothing?: None Help from another person to put on and taking off regular lower body clothing?: A Little 6 Click Score: 20   End of Session Equipment Utilized During Treatment: Rolling walker (2 wheels) Nurse Communication: Mobility status  Activity Tolerance: Patient tolerated treatment well Patient left: in bed;with call bell/phone within reach  OT Visit Diagnosis: Unsteadiness on feet (R26.81)                Time: [id number] OT Time Calculation (min): 23 min Charges:  OT General Charges $OT Visit: 1 Visit OT Evaluation $OT Eval Moderate Complexity: 1 Mod OT Treatments $Self Care/Home Management : 8-22 mins  01/11/2023  RP, OTR/L  Acute Rehabilitation Services  Office:  [phone]   Gerlene Burdockichard  D Waver Dibiasio 01/11/2023, 9:45 AM

## 2023-01-11 NOTE — Discharge Summary (Signed)
Physician Discharge Summary  Patient ID: [name] MRN: [id number] DOB/AGE: Aug 29, 1936 87 y.o.  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 The patient tolerated the procedure well and was taken to the recovery room and then to the floor in stable condition. The hospital course was routine. There were no complications. The wound remained clean dry and intact. Pt had appropriate back soreness. No complaints of leg pain or new N/T/W. The patient remained afebrile with stable vital signs, and tolerated a regular diet. The patient continued to increase activities, and pain was well controlled with oral pain medications.   Consults: None  Significant Diagnostic Studies:  Results for orders placed or performed during the hospital encounter of 01/10/23  Glucose, capillary  Result Value Ref Range   Glucose-Capillary 106 (H) 70 - 99 mg/dL  Glucose, capillary  Result Value Ref Range   Glucose-Capillary 119 (H) 70 - 99 mg/dL    DG Lumbar Spine 2-3 Views  Result Date: 01/10/2023 CLINICAL DATA:  [id number] Elective surgery [id number] EXAM: LUMBAR SPINE - 2-3 VIEW COMPARISON:  Radiograph 04/24/2022 FINDINGS: Intraoperative images during L2-L3 posterior and interbody fusion and removal of previous posterior hardware at L3-L4. L3-L4 intervertebral disc spacer remains in place. IMPRESSION: Intraoperative images during L2-L3 posterior and interbody fusion. Electronically Signed   By: Caprice Renshaw M.D.   On: 01/10/2023 14:17   DG C-Arm 1-60 Min-No Report  Result Date: 01/10/2023 Fluoroscopy was utilized by the requesting physician.  No radiographic  interpretation.   DG C-Arm 1-60 Min-No Report  Result Date: 01/10/2023 Fluoroscopy was utilized by the requesting physician.  No radiographic interpretation.   DG C-Arm 1-60 Min-No Report  Result Date: 01/10/2023 Fluoroscopy was utilized by the requesting physician.  No radiographic interpretation.    Antibiotics:  Anti-infectives (From admission, onward)    Start     Dose/Rate Route Frequency Ordered Stop   01/10/23 1700  ceFAZolin (ANCEF) IVPB 2g/100 mL premix        2 g 200 mL/hr over 30 Minutes Intravenous Every 8 hours 01/10/23 1543 01/11/23 0136   01/10/23 0930  ceFAZolin (ANCEF) IVPB 2g/100 mL premix        2 g 200 mL/hr over 30 Minutes Intravenous On call to O.R. 01/10/23 0918 01/10/23 1105       Discharge Exam: Blood pressure 136/69, pulse (!) 52, temperature 97.7 F (36.5 C), temperature source Oral, resp. rate 16, height 5\' 9"  (1.753 m), weight 95.3 kg, SpO2 98 %. Neurologic: Grossly normal Ambulating and voiding well incision cdi   Discharge Medications:   Allergies as of 01/11/2023       Reactions   Oxycodone Shortness Of Breath, Cough   Lisinopril Cough   Statins Other (See Comments)   MYALGIAS   Welchol [colesevelam Hcl] Itching        Medication List     STOP taking these medications    apixaban 2.5 MG Tabs tablet Commonly known as: ELIQUIS   clopidogrel 75 MG tablet Commonly known as: PLAVIX       TAKE these medications    acetaminophen 500 MG tablet Commonly known as: TYLENOL Take 1,000 mg by mouth every 6 (six) hours as  needed for mild pain, moderate pain or headache. Reported on 03/16/2016   amLODipine 5 MG tablet Commonly known as: NORVASC Take 1 tablet by mouth once daily   ezetimibe 10 MG tablet Commonly known as: ZETIA Take 1 tablet (10 mg total) by mouth daily.   Fenofibric Acid 135 MG Cpdr TAKE 1 CAPSULE BY MOUTH EVERY DAY   furosemide 20 MG tablet Commonly known as: LASIX Take 1 tablet (20 mg total) by mouth daily as  needed.   HYDROcodone-acetaminophen 5-325 MG tablet Commonly known as: NORCO/VICODIN Take 1 tablet by mouth every 4 (four) hours as needed for moderate pain.   IRON PO Take 65 mg by mouth daily.   methocarbamol 750 MG tablet Commonly known as: Robaxin-750 Take 1 tablet (750 mg total) by mouth 4 (four) times daily.   multivitamin with minerals tablet Take 1 tablet by mouth daily.   Nexletol 180 MG Tabs Generic drug: Bempedoic Acid Take 180 mg by mouth daily.   OneTouch Verio test strip Generic drug: glucose blood 1 each by Other route as needed for other. Use as instructed   pantoprazole 40 MG tablet Commonly known as: PROTONIX Take 1 tablet by mouth once daily   ranolazine 500 MG 12 hr tablet Commonly known as: RANEXA Take 1 tablet by mouth twice daily   sildenafil 100 MG tablet Commonly known as: VIAGRA TAKE ONE TABLET BY MOUTH DAILY AS NEEDED FOR ERECTILE DYSFUNCTION   valsartan-hydrochlorothiazide 320-12.5 MG tablet Commonly known as: DIOVAN-HCT Take 1 tablet by mouth once daily               Durable Medical Equipment  (From admission, onward)           Start     Ordered   01/10/23 1544  DME Walker rolling  Once       Question:  Patient needs a walker to treat with the following condition  Answer:  S/P lumbar fusion   01/10/23 1543   01/10/23 1544  DME 3 n 1  Once        01/10/23 1543            Disposition: home   Final Dx: PLIF L2-3, R carpal tunnel release  Discharge Instructions      Remove dressing in 72 hours   Complete by: As directed    Call MD for:  difficulty breathing, headache or visual disturbances   Complete by: As directed    Call MD for:  extreme fatigue   Complete by: As directed    Call MD for:  hives   Complete by: As directed    Call MD for:  persistant dizziness or light-headedness   Complete by: As directed    Call MD for:  persistant nausea and vomiting   Complete by: As directed    Call MD for:  redness,  tenderness, or signs of infection (pain, swelling, redness, odor or green/yellow discharge around incision site)   Complete by: As directed    Call MD for:  severe uncontrolled pain   Complete by: As directed    Call MD for:  temperature >100.4   Complete by: As directed    Diet - low sodium heart healthy   Complete by: As directed    Driving Restrictions   Complete by: As directed    No driving for 2 weeks, no riding in the car for 1 week   Increase activity slowly   Complete by: As directed    Lifting  restrictions   Complete by: As directed    No lifting more than 8 lbs          Signed: Tiana Loft Koda Defrank 01/11/2023, 7:56 AM

## 2023-01-11 NOTE — Progress Notes (Signed)
Patient alert and oriented, void, ambulate. D/c instructions explain and given all questions answered. Surgical site clean dry. Patient d/c home per order.

## 2023-01-11 NOTE — Plan of Care (Signed)

## 2023-01-12 ENCOUNTER — Telehealth: Payer: Self-pay

## 2023-01-12 NOTE — Transitions of Care (Post Inpatient/ED Visit) (Signed)
   01/12/2023  Name: KAMI ROTAR MRN: [id number] DOB: 1936-01-16  Today's TOC FU Call Status: Today's TOC FU Call Status:: Successful TOC FU Call Competed TOC FU Call Complete Date: 01/12/23  Transition Care Management Follow-up Telephone Call Date of Discharge: 01/11/23 Discharge Facility: [facility]) Type of Discharge: Inpatient Admission Primary Inpatient Discharge Diagnosis:: PLIF L2-3, R carpal tunnel release How have you been since you were released from the hospital?: Better Any questions or concerns?: Yes Patient Questions/Concerns:: not sure when to restart Plavix and Eliquis Patient Questions/Concerns Addressed: Other:, Notified Provider of Patient Questions/Concerns (provided Dot (pt wife) with Dr. Yetta Barre office phone number and advised her to call their office since Dr. Yetta Barre stopped the medications and will advise when to restart them. She verbalized understanding and will call our office back if needs furhter asst.)  Items Reviewed: Did you receive and understand the discharge instructions provided?: Yes Medications obtained and verified?: Yes (Medications Reviewed) Any new allergies since your discharge?: No Dietary orders reviewed?: NA Do you have support at home?: Yes People in Home: significant other Name of Support/Comfort Primary Source: Dot  Home Care and Equipment/Supplies: Were Home Health Services Ordered?: Yes Name of Home Health Agency:: [facility] Has Agency set up a time to come to your home?: Yes First Home Health Visit Date: 01/12/23 Any new equipment or medical supplies ordered?: Yes (walker) Were you able to get the equipment/medical supplies?: Yes Do you have any questions related to the use of the equipment/supplies?: No  Functional Questionnaire: Do you need assistance with bathing/showering or dressing?: Yes Do you need assistance with meal preparation?: No Do you need assistance with eating?: No Do you have difficulty  maintaining continence: No Do you need assistance with getting out of bed/getting out of a chair/moving?: Yes Do you have difficulty managing or taking your medications?: No  Follow up appointments reviewed: PCP Follow-up appointment confirmed?: NA Specialist Hospital Follow-up appointment confirmed?: Yes Date of Specialist follow-up appointment?: 01/22/23 Follow-Up Specialty Provider:: Dr. Gabriel Cirri Do you need transportation to your follow-up appointment?: No Do you understand care options if your condition(s) worsen?: Yes-patient verbalized understanding    Agnes Lawrence, CMA (AAMA)  [facility] [phone]

## 2023-02-01 ENCOUNTER — Ambulatory Visit: Payer: Medicare Other | Admitting: Physician Assistant

## 2023-02-28 ENCOUNTER — Other Ambulatory Visit: Payer: Self-pay | Admitting: Cardiology

## 2023-02-28 DIAGNOSIS — E785 Hyperlipidemia, unspecified: Secondary | ICD-10-CM

## 2023-03-24 NOTE — Progress Notes (Addendum)
Cardiology Office Note:    Date:  03/30/2023   ID:  [name], DOB 09-23-1936, MRN [id number]  PCP:  Ronnald Nian, MD   [facility] Providers Cardiologist:  Bryan Lemma, MD Cardiology APP:  Marcelino Duster, [place] { Referring MD: Ronnald Nian, MD   Chief Complaint  Patient presents with   Follow-up    New Afib    History of Present Illness:    [name] is a 87 y.o. male with a hx of extensive cardiac history including CAD since 1985 with multiple stenting and ultimately a CABG in 1997 with patent LIMA-LAD, DM2, hypertension, CHF, EVAR for AAA followed by Dr. Randie Heinz, asthma, GERD, Afib (new 12/2022) and hyperlipidemia.  Heart cath in 05/2014 with DES to ISR of RCA and OM1.  Nuclear stress test in 05/2021 with intermediate risk and similar findings still prior study.  Echo 06/10/2021 showed LVEF with 45 to 50% and grade 1 diastolic dysfunction, WMA, mildly reduced RV function, severely dilated left atrium, mild to moderate MR, and mild AS.  He has been on Plavix monotherapy and is intolerant to statins.  He has not been interested in PCSK9 inhibitor or Nexletol.  He is maintained on Zetia and fenofibrate.  Antianginal regimen includes amlodipine and ranolazine.  He has CKD with serum creatinine 1.5-1.6.  He presented to [facility] ED on 08/19/2021 with chest pain.  He underwent left heart catheterization with continued patency of the LIMA-LAD but distal circumflex disease treated with DES.  He was started on aspirin and Brilinta.  He is not on a beta-blocker due to history of bradycardia.  Recent CT abdomen pelvis with possible persistent endoleak resulting in aneurysmal sac enlargement.  He was advised to follow-up with the [facility] as outpatient. I saw him post cath for TOC.   He presents for outpatient follow-up, TOC post cath. He was switched from brilinta to plavix for SOB. He was recommended for long-term plavix monotherapy after 12 months of DAPT.   Not on BB due to  fatigue Statin myopathy - on zetia and nexletol  SBP goal < 160 given age - on ARB and HCTZ  Last seen by Dr. Herbie Baltimore and continued to complain of fatigue. Avoiding BB. Felt related to balance issues causing increased effort. Vestibular therapy was recommended.   During a preoperative evaluation with anesthesia, EKG noted to be new A-fib.  He contacted cardiology office who confirmed atrial fibrillation and he was started on Eliquis 2.5 mg twice daily with discontinuation of aspirin.  He presents today for follow up. He remains in Afib, asymptomatic. He is only taking eliquis once daily due to cost. I will provide patient assistance.   Wife is only giving him Ranexa once daily and is only giving him 2.5 mg Eliquis once daily.   Past Medical History:  Diagnosis Date   Adenomatous colon polyp    Ankle edema    Chronic   Asthma    CAD S/P percutaneous coronary angioplasty 2004   a) '04: Staged Taxus DES PCI to RCA (2 Taxus 2.75 x 32 & 12) and Cx-OM (Taxus 3 x 20);;'07 - PCI to pRCA ISR- Cypher DES; c) 05/2014: pPCI pRCA stentISR (Promus DES 3 x 8) OM1 distal to stent (Promus DES 2.75 x 12 - 3.1); 11/'22: distal LCx stent edge 99% -> Onyx Frontier DES 2.5 x 15 - 2.8 (overlaps prior stent)   CAD, multiple vessel 1985   Most recent July 2018: Admitted for non-STEMI. Occluded  LAD with patent LIMA (SP1 now occluded).  Patent stents in proximal and mid RCA as well as circumflex-OM 3. Known occlusion of SVG-OM. EF 45-50%   CHF (congestive heart failure) (HCC)    Cholelithiasis - with cholangitis & choledocholithiasis    status post ERCP with removal of calculi and biliary stent placement.   Chronic anemia    On iron supplement; history of positive guaiac - negative colonoscopy in 1996.; Thought to be related to hemorrhoids; status post hemorrhoidectomy   Chronic back pain     multiple surgeries; C-spine and lumbar   Diabetes mellitus    no meds- pt states he is no longer DM   Diverticulitis of  colon 1996   Diverticulosis    Dyslipidemia, goal LDL below 70    Erectile dysfunction    Exertional dyspnea    Chronic baseline SOB with ambulation   GERD (gastroesophageal reflux disease)    Grade II diastolic dysfunction    H/O: pneumonia 11/2012   Hemorrhoids    Hiatal hernia    History of: ST elevation myocardial infarction (STEMI) involving left circumflex coronary artery with complication 1985   PTCA-circumflex; PCI in 1991   Hypertension    Moderate aortic stenosis by prior echocardiogram 07/2015   Normal LV function - EF 55-60%.. Abnormal relaxation. Mild-moderate aortic stenosis (peak/mean gradient 18/10 mmH)   Osteoarthritis of both knees    And back; multiple back surgeries, right knee arthroplasty and left knee arthroscopic surgery x2   Pneumonia 2016   S/P AAA (abdominal aortic aneurysm) repair 08/30/2012   s/p EVAR   S/P CABG x 2 1997   LIMA-LAD, SVG-OM   Sleep apnea    pt. states he was told to return for f/u, to be fitted for Cpap, but pt. reports that he didn't follow up-no cpap used   Statin myopathy 09/30/2015    Past Surgical History:  Procedure Laterality Date   Abdominal and Lower Extremity Arterial Ultrasound  08/23/2012; 10/10/2013   Normal ABIs. Nonocclusive lower extremity disease. 4.2 cm x 4.3 cm infrarenal AAA;; 4.4 cm x 4.3 cm (essentially stable)    ABDOMINAL AORTIC ENDOVASCULAR STENT GRAFT N/A 08/13/2015   Procedure: ABDOMINAL AORTIC ENDOVASCULAR STENT GRAFT;  Surgeon: Pryor Ochoa, MD;  Location: [facility] OR;  Service: Vascular;  Laterality: N/A;   Anterior cervical plating  04/23/2010   At C4-5 and a C6-7 utilizing two separate Biomet MaxAn plates.   ANTERIOR LAT LUMBAR FUSION Left 11/22/2012   Procedure: ANTERIOR LATERAL LUMBAR FUSION 1 LEVEL;  Surgeon: Tia Alert, MD;  Location: [facility] NEURO ORS;  Service: Neurosurgery;  Laterality: Left;  Anterior Lateral Lumbar Fusion Lumbar Three-Four   BACK SURGERY  1979 & x 10   pt. remarks, "I have had about  10 back surgeries"   BILIARY STENT PLACEMENT N/A 07/03/2014   Procedure: BILIARY STENT PLACEMENT;  Surgeon: Louis Meckel, MD;  Location: [facility] ENDOSCOPY;  Service: Endoscopy;  Laterality: N/A;   CARPAL TUNNEL RELEASE Left 11/16/2019   Procedure: LEFT CARPAL TUNNEL RELEASE;  Surgeon: Kathryne Hitch, MD;  Location: [facility] ORS;  Service: Orthopedics;  Laterality: Left;   CARPAL TUNNEL RELEASE Right 01/10/2023   Procedure: RIGHT CARPAL TUNNEL RELEASE;  Surgeon: Tia Alert, MD;  Location: [facility] OR;  Service: Neurosurgery;  Laterality: Right;   CATARACT EXTRACTION     Cervical arthrodesis  04/23/2010   Anterior cervical arthrodesis, C4-5, C6-7 utilizing 7-mm PEEK interbody cage packed with local autograft & Antifuse putty at C4-5 &  an 8-mm cage at C6-7.   CERVICAL DISCECTOMY  04/23/2010   Decompressive anterior carvical diskectomy. C4-5, C6-7   CHOLECYSTECTOMY N/A 05/23/2015   Procedure: LAPAROSCOPIC CHOLECYSTECTOMY WITH INTRAOPERATIVE CHOLANGIOGRAM;  Surgeon: Ovidio Kin, MD;  Location: [facility] ORS;  Service: General;  Laterality: N/A;   COLONOSCOPY  1996   CORONARY ANGIOPLASTY  1985,1991,15   1985 lateral STEMI Circumflex PTCA;    CORONARY ARTERY BYPASS GRAFT  1997   LIMA-LAD, SVG-OM (SVG known to be occluded prior to 2004)   CORONARY STENT INTERVENTION N/A 08/20/2021   Procedure: CORONARY STENT INTERVENTION;  Surgeon: Runell Gess, MD;  Location: [facility] INVASIVE CV LAB;  Service: Cardiovascular:  dLCx distal Edge 99% => Onyx Frontier DES 2.5 x 15 (2.8 mm) overlaps prior stent distally.   ERCP N/A 07/03/2014   Procedure: ENDOSCOPIC RETROGRADE CHOLANGIOPANCREATOGRAPHY (ERCP);  Surgeon: Louis Meckel, MD;  Location: [facility] ENDOSCOPY;  Service: Endoscopy;  Laterality: N/A;   ERCP N/A 07/05/2014   Procedure: ENDOSCOPIC RETROGRADE CHOLANGIOPANCREATOGRAPHY (ERCP);  Surgeon: Louis Meckel, MD;  Location: [facility] ENDOSCOPY;  Service: Endoscopy;  Laterality: N/A;   ERCP N/A 06/30/2015   Procedure:  ENDOSCOPIC RETROGRADE CHOLANGIOPANCREATOGRAPHY (ERCP);  Surgeon: Meryl Dare, MD;  Location: [facility] ENDOSCOPY;  Service: Endoscopy;  Laterality: N/A;   EYE SURGERY     IR ANGIOGRAM EXTREMITY LEFT  01/18/2017   IR ANGIOGRAM PELVIS SELECTIVE OR SUPRASELECTIVE  01/18/2017   IR ANGIOGRAM SELECTIVE EACH ADDITIONAL VESSEL  01/18/2017   IR AORTAGRAM ABDOMINAL SERIALOGRAM  01/18/2017   IR EMBO ARTERIAL NOT HEMORR HEMANG INC GUIDE ROADMAPPING  01/18/2017   IR RADIOLOGIST EVAL & MGMT  01/04/2017   IR RADIOLOGIST EVAL & MGMT  10/26/2017   IR RADIOLOGIST EVAL & MGMT  05/24/2018   IR RADIOLOGIST EVAL & MGMT  06/19/2019   IR RADIOLOGIST EVAL & MGMT  06/11/2020   IR RADIOLOGIST EVAL & MGMT  07/02/2021   IR RADIOLOGIST EVAL & MGMT  06/22/2022   IR US GUIDE VASC ACCESS RIGHT  01/18/2017   KNEE ARTHROSCOPY Left    x 2   LEFT HEART CATH AND CORS/GRAFTS ANGIOGRAPHY  06/2003   None Occluded vein graft to OM; diffuse RCA disease in the mid vessel, 80% circumflex-OM stenosis; follow on AV groove circumflex with sequential 90% stenoses and intervening saccular dilation    LEFT HEART CATH AND CORS/GRAFTS ANGIOGRAPHY  12/2008   4 abnormal Myoview showing apical thinning (possibly due to apical LAD 95%) : 100% Occluded LAD after SV1, distal LAD grafted via LIMA -apical 95% . Cx -OM1 w/patent stent extending into OM 1 . Follow on Cx - 70-80% - non-amenable PCI. RCA widely patent 3 overlapping stents in mRCA w/less than 40% stenosisin RPL; SVG-OM known occluded    LEFT HEART CATH AND CORS/GRAFTS ANGIOGRAPHY N/A 04/20/2017   Procedure: Left Heart Cath and Cors/Grafts Angiography;  Surgeon: Runell Gess, MD;  Location: [facility] INVASIVE CV LAB;  LAD now occluded prior to SP1.LIMA-LAD. Patent RCA and circumflex stents. EF 45-50%. Relatively stable.    LEFT HEART CATH AND CORS/GRAFTS ANGIOGRAPHY N/A 08/20/2021   Procedure: LEFT HEART CATH AND CORS/GRAFTS ANGIOGRAPHY;  Surgeon: Runell Gess, MD;  Location: [facility] INVASIVE CV  LAB;  Service: Cardiovascular: CULPRIT 99% dLCx just after stent (Overlapping DES PCI) - otw patent LCx stent, patent RCA stent & Patent LIMA-LAD with CTO of prox LAD.   LEFT HEART CATHETERIZATION WITH CORONARY ANGIOGRAM N/A 05/15/2014   Procedure: LEFT HEART CATHETERIZATION WITH CORONARY ANGIOGRAM;  Surgeon: Barry Dienes  Leia Alf, MD;  Location: [facility] CATH LAB;  Service: Cardiovascular;  Laterality: N/A;   LEFT HEART CATHETERIZATION WITH CORONARY/GRAFT ANGIOGRAM N/A 06/28/2014   Procedure: LEFT HEART CATHETERIZATION WITH Isabel Caprice;  Surgeon: Lennette Bihari, MD;  Location: [facility] CATH LAB;  Service: Cardiovascular;  Laterality: N/A;   LUMBAR PERCUTANEOUS PEDICLE SCREW 1 LEVEL N/A 11/22/2012   Procedure: LUMBAR PERCUTANEOUS PEDICLE SCREW 1 LEVEL;  Surgeon: Tia Alert, MD;  Location: [facility] NEURO ORS;  Service: Neurosurgery;  Laterality: N/A;  Lumbar Three-Four Percutaneous Pedicle Screw, Lateral approach   MINOR HEMORRHOIDECTOMY     NM MYOVIEW LTD  05/26/2021   Lexiscan: Intermediate risk (similar findings to last study), with exception of reduced EF roughly 40%..  Prior basal inferior-inferolateral infarct without peri-infarct ischemia.   ->  Echo ordered -> (EF 45 to 50%)-when compared to prior Myoview, infarct appears similar.   PERCUTANEOUS CORONARY STENT INTERVENTION (PCI-S)  06/2003   PCI - RCA 2 overlapping Taxus DES 2.75 mm x 32 mm and 2.75 mm x 12 mm (3.0 mm); PCI-Cx-OM1 - Taxus DES 3.0 mm x 20 mm (3.1 mm);    PERCUTANEOUS CORONARY STENT INTERVENTION (PCI-S)  12/2005   80% ISR in proximal Taxus stent in RCA -- covered proximally with Cypher DES 3.0 mm x 12 mm   PERCUTANEOUS CORONARY STENT INTERVENTION (PCI-S) N/A 05/17/2014   Procedure: PERCUTANEOUS CORONARY STENT INTERVENTION (PCI-S);  Surgeon: Lesleigh Noe, MD;  Location: [facility] CATH LAB: PCI pRCA stent ISR - Promus DES 3.0 x 8 (3.25), OM distal stent edge - Promus DES 2.75 x 12 (3.1)   PERIPHERAL VASCULAR CATHETERIZATION  11/03/2016    Procedure: Embolization;  Surgeon: Maeola Harman, MD;  Location: [facility] INVASIVE CV LAB;  Service: Cardiovascular;;   POSTERIOR CERVICAL FUSION/FORAMINOTOMY N/A 05/02/2013   Procedure: POSTERIOR CERVICAL FUSION/FORAMINOTOMY CERVICAL SEVEN THORACIC-ONE;  Surgeon: Tia Alert, MD;  Location: [facility] NEURO ORS;  Service: Neurosurgery;  Laterality: N/A;  POSTERIOR CERVICAL FUSION/FORAMINOTOMY CERVICAL SEVEN THORACIC-ONE   SPHINCTEROTOMY N/A 06/30/2015   Procedure: SPHINCTEROTOMY;  Surgeon: Meryl Dare, MD;  Location: [facility] ENDOSCOPY;  Service: Endoscopy;  Laterality: N/A;   TOTAL KNEE ARTHROPLASTY Right    TOTAL KNEE ARTHROPLASTY Left 12/20/2016   Procedure: LEFT TOTAL KNEE ARTHROPLASTY;  Surgeon: Dannielle Huh, MD;  Location: [facility] OR;  Service: Orthopedics;  Laterality: Left;   TRANSTHORACIC ECHOCARDIOGRAM  06/10/2021   EF 45 to 50%.  Mildly reduced function.  Mild hypokinesis of basal-mid inferior and inferolateral wall consistent with prior infarct.  Severe LA dilation.  Mild to moderate MR with moderate MAC.  Mild calcific AS (mean gradient 11 mmHg) --> LV function seems unchanged.  Diastolic function improved.  Wall motion abnormality unchanged.  AS unchanged.   TRANSTHORACIC ECHOCARDIOGRAM  07/2018   Normal LV size.  EF 50 to 55% with mild HK of inferolateral wall.  GRII DD.  Mild AS with mean gradient 15 mm or greater.  Mild ascending aortic dilation.  Mildly increased PA pressures of 41 mmHg   UPPER GI ENDOSCOPY  07/03/2019   VISCERAL ANGIOGRAM  11/03/2016   Procedure: Visceral Angiogram;  Surgeon: Maeola Harman, MD;  Location: [facility] INVASIVE CV LAB;  Service: Cardiovascular;;    Current Medications: Current Meds  Medication Sig   acetaminophen (TYLENOL) 500 MG tablet Take 1,000 mg by mouth every 6 (six) hours as needed for mild pain, moderate pain or headache. Reported on 03/16/2016   amLODipine (NORVASC) 5 MG tablet Take 1 tablet by mouth once  daily   clopidogrel (PLAVIX) 75 MG  tablet Take 75 mg by mouth daily.   ezetimibe (ZETIA) 10 MG tablet Take 1 tablet by mouth once daily   Ferrous Sulfate (IRON PO) Take 65 mg by mouth daily.   furosemide (LASIX) 20 MG tablet Take 1 tablet (20 mg total) by mouth daily as needed.   glucose blood (ONETOUCH VERIO) test strip 1 each by Other route as needed for other. Use as instructed   Multiple Vitamins-Minerals (MULTIVITAMIN WITH MINERALS) tablet Take 1 tablet by mouth daily.   pantoprazole (PROTONIX) 40 MG tablet Take 1 tablet by mouth once daily   ranolazine (RANEXA) 500 MG 12 hr tablet Take 1 tablet by mouth twice daily   sildenafil (VIAGRA) 100 MG tablet TAKE ONE TABLET BY MOUTH DAILY AS NEEDED FOR ERECTILE DYSFUNCTION   valsartan-hydrochlorothiazide (DIOVAN-HCT) 320-12.5 MG tablet Take 1 tablet by mouth once daily   [DISCONTINUED] apixaban (ELIQUIS) 2.5 MG TABS tablet Take 2.5 mg by mouth 2 (two) times daily.   [DISCONTINUED] apixaban (ELIQUIS) 2.5 MG TABS tablet Take 2.5 mg by mouth 2 (two) times daily.   [DISCONTINUED] Choline Fenofibrate (FENOFIBRIC ACID) 135 MG CPDR TAKE 1 CAPSULE BY MOUTH EVERY DAY     Allergies:   Oxycodone, Lisinopril, Statins, and Welchol [colesevelam hcl]   Social History   Socioeconomic History   Marital status: Married    Spouse name: Not on file   Number of children: Not on file   Years of education: Not on file   Highest education level: Not on file  Occupational History   Not on file  Tobacco Use   Smoking status: Former    Packs/day: 3.00    Years: 45.00    Additional pack years: 0.00    Total pack years: 135.00    Types: Cigarettes    Quit date: 10/05/1983    Years since quitting: 39.5   Smokeless tobacco: Never  Vaping Use   Vaping Use: Never used  Substance and Sexual Activity   Alcohol use: Yes    Alcohol/week: 7.0 - 14.0 standard drinks of alcohol    Types: 7 - 14 Cans of beer per week    Comment: 1-2 beers per day   Drug use: No   Sexual activity: Yes  Other Topics  Concern   Not on file  Social History Narrative   He is married, father of two, grandfather to four, great grandfather to two.    Not really getting much exercise now, do to his significant back and hip pain.    He does not smoke and only has an alcoholic beverage.    Social Determinants of Health   Financial Resource Strain: Not on file  Food Insecurity: Not on file  Transportation Needs: Not on file  Physical Activity: Not on file  Stress: Not on file  Social Connections: Not on file     Family History: The patient's family history includes Arthritis in his mother; Breast cancer in his sister; Diabetes in his father and sister; Heart disease in his brother, father, and sister; Hypertension in his sister; Stroke in his sister; Ulcers in his brother. There is no history of Colon cancer.  ROS:   Please see the history of present illness.     All other systems reviewed and are negative.  EKGs/Labs/Other Studies Reviewed:    The following studies were reviewed today: Cardiac Studies & Procedures   CARDIAC CATHETERIZATION  CARDIAC CATHETERIZATION 08/21/2021  Narrative Images from the original result  were not included.    Ost LAD to Prox LAD lesion is 100% stenosed.   Dist Cx lesion is 99% stenosed.   Previously placed Prox RCA to Mid RCA stent (unknown type) is  widely patent.   Previously placed Mid Cx to Dist Cx stent (unknown type) is  widely patent.   A drug-eluting stent was successfully placed.   Post intervention, there is a 0% residual stenosis.   Post intervention, there is a 0% residual stenosis.  [name] is a 87 y.o. male   [id number] LOCATION:  FACILITY: [facility] PHYSICIAN: Nanetta Batty, M.D. 13-Jun-1936   DATE OF PROCEDURE:  08/20/2021  DATE OF DISCHARGE:     CARDIAC CATHETERIZATION / PCI DES LCX OM    History obtained from chart review.87 y.o. male with a hx of CAD since 1985 with STEMI and multiple stents since and then CABG 1997, AAA and  repair 08/2015, HLD,  DM-2 CHF Asthma, GERD who is being seen 08/19/2021 for the evaluation of chest pain at the request of Dr. Jeraldine Loots.  His last catheterization performed by myself in 2018 revealed a patent LIMA to LAD which itself was occluded in the proximal portion, patent distal circumflex obtuse marginal branch stent and patent RCA stent.  He was admitted with unstable angina and mildly elevated enzymes.  He was referred for diagnostic coronary angiography.   PROCEDURE DESCRIPTION:  The patient was brought to the second floor [place] Cardiac cath lab in the postabsorptive state. He was not premedicated. His right groin was prepped and shaved in usual sterile fashion. Xylocaine 1% was used for local anesthesia. A 5 French sheath was inserted into the right common femoral artery using standard Seldinger technique.  5 French right left Judkins diagnostic catheters on the 5 French pigtail catheter were used for selective coronary angiography, selective left internal mammary artery graft angiography and obtain left heart pressures.  Isovue dye was used for the entirety of the case (95 cc of contrast total to patient).  Retrograde aortic, left ventricular and pullback pressures were recorded.  The culprit lesion was the distal circumflex obtuse marginal branch stenosis just beyond the previously placed stent and originating from within the distal portion of the stent.  Patient received a total of 13,000's of heparin with an ACT of 289.  He received Brilinta 180 mg p.o. loading dose.  Isovue dye was used for the entirety of the intervention.  Retroaortic pressures monitored during the case.  Using a 6 [place] XB 3.0 cm guide catheter also 0.14 [place] guidewire and a 2 mm x 12 mm balloon the distal circumflex lesion was predilated and a 6 atm.  Following this a 2.55 x 15 mm long Medtronic frontier drug-eluting stent was then carefully positioned across the diseased segment and deployed at 16 atm.  I  postdilated with a 2.75 mm x 12 mm long noncompliant balloon at 16 atm (2.8 mm) resulting reduction of a 95% distal circumflex obtuse marginal branch stenosis to 0% residual with excellent flow.  The patient tolerated procedure well.  There were no hemodynamic or electrocardiographic sequela.  The guidewire and catheter were removed and the sheath was secured.  Impression Successful PCI drug-eluting stenting of a distal circumflex obtuse marginal branch stenosis involving the distal edge of the previously placed stent with placement of a 2.5 x 15 mm long Medtronic frontier drug-eluting stent postdilated 2.8 mm.  There was an abnormality in the mid RCA within the stented segment that did not appear significantly  different from the angiogram 4 years ago and therefore this was not addressed.  The sheath will be removed once the ACT falls below 170 and pressure held.  Patient be hydrated overnight.  He will need 12 months of uninterrupted DAPT.  He left the lab in stable condition.  Nanetta Batty. MD, [employer] 08/20/2021 2:20 PM  Findings Coronary Findings Diagnostic  Dominance: Right  Left Anterior Descending Ost LAD to Prox LAD lesion is 100% stenosed.  Left Circumflex Previously placed Mid Cx to Dist Cx stent (unknown type) is  widely patent. Dist Cx lesion is 99% stenosed.  Right Coronary Artery Previously placed Prox RCA to Mid RCA stent (unknown type) is  widely patent.  LIMA Graft To Dist LAD  Intervention  Mid Cx to Dist Cx lesion Stent (Also treats lesions: Dist Cx) Lesion crossed with guidewire. Pre-stent angioplasty was performed. A drug-eluting stent was successfully placed. Stent strut is well apposed. Stent overlaps previously placed stent. Post-stent angioplasty was performed. Post-Intervention Lesion Assessment The intervention was successful. Pre-interventional TIMI flow is 3. Post-intervention TIMI flow is 3. No complications occurred at this lesion. There is a 0% residual  stenosis post intervention.  Dist Cx lesion Stent (Also treats lesions: Mid Cx to Dist Cx) See details in Mid Cx to Dist Cx lesion. Post-Intervention Lesion Assessment The intervention was successful. Pre-interventional TIMI flow is 3. Post-intervention TIMI flow is 3. No complications occurred at this lesion. There is a 0% residual stenosis post intervention.   CARDIAC CATHETERIZATION  CARDIAC CATHETERIZATION 04/20/2017  Narrative Images from the original result were not included.   Prox RCA to Mid RCA lesion, 0 %stenosed.  LM lesion, 100 %stenosed.  Ost 3rd Mrg to 3rd Mrg lesion, 0 %stenosed.  LIMA and is normal in caliber and anatomically normal.  Ost LAD to Prox LAD lesion, 100 %stenosed.  There is mild left ventricular systolic dysfunction.  LV end diastolic pressure is normal.  The left ventricular ejection fraction is 45-50% by visual estimate.  BLANCHE LUCZAK is a 87 y.o. male   [id number] LOCATION:  FACILITY: [facility] PHYSICIAN: Nanetta Batty, M.D. [birth year]   DATE OF PROCEDURE:  04/20/2017  DATE OF DISCHARGE:     CARDIAC CATHETERIZATION    History obtained from chart review. Mr. Barley is a 87 year old moderately overweight Caucasian male patient of Dr. Elissa Hefty who has a history of CAD dating back to 106. He had coronary artery bypass grafting 2 in 1997. He's had endoluminal stent grafting as well. His last crit catheterization performed in 2015 by Dr. Tresa Endo revealed widely patent stents in his circumflex obtuse marginal branch and in his right coronary artery. The vein graft to circumflex was occluded and the LIMA to the LAD was widely patent. He has mild to moderate LV dysfunction. He developed chest pain at 10:00 this morning rating to both arms. He was seen in the emergency room by Dr. Royann Shivers . His EKG showed no acute changes. His first troponin was low on his second troponin was 3.47. EKG showed no acute changes. Based on this was elected to bring  him urgently to the Cath Lab to define his anatomy.  Supravalvular aortography: The ascending aorta was moderately dilated but there was no evidence of dissection.  Impression Mr. Malpass has widely patent stents in his circumflex obtuse marginal branch and right coronary artery. The LAD is occluded at its origin with a patent LIMA graft. I cannot identify a culprit lesion responsible for his chest pain and mild troponin leak.  LV function is mildly depressed as described by recent echo and prior cath. The sheath was removed and pressure held on the groin to achieve hemostasis. The patient left the lab in stable condition. I am going to restart heparin in 6 hours without a bolus given his positive troponin.  Nanetta Batty. MD, [facility] 04/20/2017 7:49 PM  Findings Coronary Findings Diagnostic  Dominance: Right  Left Main  Left Anterior Descending  Left Circumflex  Third Obtuse Marginal Branch Ost 3rd Mrg to 3rd Mrg lesion with no stenosis was previously treated.  Right Coronary Artery Prox RCA to Mid RCA lesion with no stenosis was previously treated.  LIMA LIMA Graft To Dist LAD LIMA and is normal in caliber and anatomically normal.  Intervention  No interventions have been documented.   STRESS TESTS  MYOCARDIAL PERFUSION IMAGING 04/08/2022   ECHOCARDIOGRAM  ECHOCARDIOGRAM COMPLETE 06/10/2021  Narrative ECHOCARDIOGRAM REPORT    Patient Name:   [name] Date of Exam: 06/10/2021 Medical Rec #:  [id number]       Height:       69.0 in Accession #:    [id number]      Weight:       212.0 lb Date of Birth:  09-01-1936       BSA:          2.118 m Patient Age:    85 years        BP:           132/78 mmHg Patient Gender: M               HR:           65 bpm. Exam Location:  [address]  Procedure: 2D Echo, Cardiac Doppler and Color Doppler  Indications:    I50.9* Heart failure (unspecified); I35.0 Nonrheumatic aortic (valve) stenosis; I25.10 Coronary Artery Disease  Native Vessel  History:        Patient has prior history of Echocardiogram examinations, most recent 07/12/2018. Previous Myocardial Infarction, Prior CABG; Risk Factors:Diabetes and Dyslipidemia. Obesity.  Sonographer:    Cathie Beams RCS Referring Phys: [phone] DAVID W HARDING  IMPRESSIONS   1. Left ventricular ejection fraction, by estimation, is 45 to 50%. The left ventricle has mildly decreased function. The left ventricle demonstrates regional wall motion abnormalities (see scoring diagram/findings for description). The left ventricular internal cavity size was mildly dilated. Left ventricular diastolic parameters are consistent with Grade I diastolic dysfunction (impaired relaxation). There is moderate hypokinesis of the left ventricular, basal-mid inferior wall and inferolateral wall. 2. Right ventricular systolic function is mildly reduced. The right ventricular size is normal. 3. Left atrial size was severely dilated. 4. The mitral valve is normal in structure. Mild to moderate mitral valve regurgitation. No evidence of mitral stenosis. Moderate mitral annular calcification. 5. The aortic valve is tricuspid. There is moderate calcification of the aortic valve. There is moderate thickening of the aortic valve. Aortic valve regurgitation is not visualized. Mild aortic valve stenosis. Aortic valve mean gradient measures 11.0 mmHg. Aortic valve Vmax measures 2.23 m/s.  Comparison(s): Prior images reviewed side by side. The left ventricular systolic function is unchanged. The left ventricular diastolic function has improved. The left ventricular wall motion abnormality is unchanged. Aortic valve stenosis appears largely unchanged, although gradients may be underestimated on the current sudy due to poor Doppler beam alignment.  FINDINGS Left Ventricle: Left ventricular ejection fraction, by estimation, is 45 to 50%. The left ventricle has mildly decreased function. The left  ventricle  demonstrates regional wall motion abnormalities. Moderate hypokinesis of the left ventricular, basal-mid inferior wall and inferolateral wall. The left ventricular internal cavity size was mildly dilated. There is no left ventricular hypertrophy. Abnormal (paradoxical) septal motion consistent with post-operative status. Left ventricular diastolic parameters are consistent with Grade I diastolic dysfunction (impaired relaxation). Indeterminate filling pressures.   LV Wall Scoring: The inferior wall and posterior wall are hypokinetic.  Right Ventricle: The right ventricular size is normal. No increase in right ventricular wall thickness. Right ventricular systolic function is mildly reduced.  Left Atrium: Left atrial size was severely dilated.  Right Atrium: Right atrial size was normal in size.  Pericardium: There is no evidence of pericardial effusion.  Mitral Valve: The mitral valve is normal in structure. Moderate mitral annular calcification. Mild to moderate mitral valve regurgitation, with centrally-directed jet. No evidence of mitral valve stenosis.  Tricuspid Valve: The tricuspid valve is normal in structure. Tricuspid valve regurgitation is mild.  Aortic Valve: The aortic valve is tricuspid. There is moderate calcification of the aortic valve. There is moderate thickening of the aortic valve. Aortic valve regurgitation is not visualized. Mild aortic stenosis is present. Aortic valve mean gradient measures 11.0 mmHg. Aortic valve peak gradient measures 19.9 mmHg. Aortic valve area, by VTI measures 1.62 cm.  Pulmonic Valve: The pulmonic valve was normal in structure. Pulmonic valve regurgitation is trivial.  Aorta: The aortic root and ascending aorta are structurally normal, with no evidence of dilitation.  IAS/Shunts: No atrial level shunt detected by color flow Doppler.   LEFT VENTRICLE PLAX 2D LVIDd:         5.80 cm  Diastology LVIDs:         5.20 cm  LV e' medial:    7.29  cm/s LV PW:         1.10 cm  LV E/e' medial:  13.7 LV IVS:        1.00 cm  LV e' lateral:   9.79 cm/s LVOT diam:     2.05 cm  LV E/e' lateral: 10.2 LV SV:         87 LV SV Index:   41 LVOT Area:     3.30 cm   RIGHT VENTRICLE RV Basal diam:  3.20 cm RV S prime:     9.23 cm/s TAPSE (M-mode): 1.8 cm  LEFT ATRIUM             Index       RIGHT ATRIUM           Index LA diam:        5.10 cm 2.41 cm/m  RA Area:     15.90 cm LA Vol (A2C):   76.0 ml 35.89 ml/m RA Volume:   44.90 ml  21.20 ml/m LA Vol (A4C):   59.0 ml 27.86 ml/m LA Biplane Vol: 67.6 ml 31.92 ml/m AORTIC VALVE AV Area (Vmax):    1.69 cm AV Area (Vmean):   1.59 cm AV Area (VTI):     1.62 cm AV Vmax:           223.00 cm/s AV Vmean:          159.000 cm/s AV VTI:            0.539 m AV Peak Grad:      19.9 mmHg AV Mean Grad:      11.0 mmHg LVOT Vmax:         114.00 cm/s LVOT Vmean:  76.500 cm/s LVOT VTI:          0.265 m LVOT/AV VTI ratio: 0.49  AORTA Ao Root diam: 3.30 cm Ao Asc diam:  3.60 cm  MITRAL VALVE                TRICUSPID VALVE MV Area (PHT): 3.17 cm     TR Peak grad:   17.1 mmHg MV Decel Time: 239 msec     TR Vmax:        206.62 cm/s MV E velocity: 99.60 cm/s MV A velocity: 123.00 cm/s  SHUNTS MV E/A ratio:  0.81         Systemic VTI:  0.26 m Systemic Diam: 2.05 cm  Mihai Croitoru MD Electronically signed by Thurmon Fair MD Signature Date/Time: 06/10/2021/4:04:48 PM    Final             EKG Interpretation  Date/Time:  Wednesday March 30 2023 15:44:58 EDT Ventricular Rate:  67 PR Interval:    QRS Duration: 102 QT Interval:  410 QTC Calculation: 433 R Axis:   -6 Text Interpretation: Atrial fibrillation Nonspecific T wave abnormality When compared with ECG of 29-Dec-2022 12:00, No significant change was found Confirmed by Micah Flesher ([id number]) on 03/30/2023 3:59:53 PM    Recent Labs: 12/29/2022: ALT 19; BUN 24; Creatinine, Ser 1.60; Hemoglobin 12.3; Platelets 189; Potassium  4.6; Sodium 139  Recent Lipid Panel    Component Value Date/Time   CHOL 188 11/02/2022 1130   TRIG 102 11/02/2022 1130   HDL 48 11/02/2022 1130   CHOLHDL 3.9 11/02/2022 1130   CHOLHDL 3.5 08/20/2021 0427   VLDL 17 08/20/2021 0427   LDLCALC 121 (H) 11/02/2022 1130     Risk Assessment/Calculations:    CHA2DS2-VASc Score = 5   This indicates a 7.2% annual risk of stroke. The patient's score is based upon: CHF History: 1 HTN History: 1 Diabetes History: 0 Stroke History: 0 Vascular Disease History: 1 Age Score: 2 Gender Score: 0            Physical Exam:    VS:  BP 132/66   Pulse 71   Ht 5\' 9"  (1.753 m)   Wt 209 lb (94.8 kg)   SpO2 96%   BMI 30.86 kg/m     Wt Readings from Last 3 Encounters:  03/30/23 209 lb (94.8 kg)  01/10/23 210 lb (95.3 kg)  12/29/22 204 lb 11.2 oz (92.9 kg)     GEN:  Well nourished, well developed in no acute distress HEENT: Normal NECK: No JVD; No carotid bruits LYMPHATICS: No lymphadenopathy CARDIAC: irregular rhythm, regular rate, + murmur RESPIRATORY:  Clear to auscultation without rales, wheezing or rhonchi  ABDOMEN: Soft, non-tender, non-distended MUSCULOSKELETAL:  1+ B LE edema; No deformity  SKIN: Warm and dry NEUROLOGIC:  Alert and oriented x 3 PSYCHIATRIC:  Normal affect   ASSESSMENT:    1. Hypertensive heart disease with chronic diastolic congestive heart failure (HCC)   2. Paroxysmal atrial fibrillation (HCC)   3. Hyperlipidemia LDL goal <70; statin intolerant   4. PAF (paroxysmal atrial fibrillation) (HCC)   5. Chronic anticoagulation   6. Cardiac murmur   7. CAD S/P CABG '95- several PCis since, last 05/17/14   8. Hyperlipidemia with target LDL less than 70   9. Primary hypertension    PLAN:    In order of problems listed above:  Paroxysmal atrial fibrillation Noted during PAT clinic 12/2022 Now on 2.5 mg eliquis BID -she was giving him  only once daily I stressed the importance of twice daily dosing for stroke  protection EKG today with rate controlled Afib-on no rate controlling medications-asymptomatic Will check labs today, check echo He is currently on Eliquis and Plavix I will send a message to Dr. Herbie Baltimore to see if he would like aspirin instead of Plavix along with Eliquis   Cardiac murmur On exam Check an echo as above   CAD with history of CABG 1997 DES to distal Lcx No beta-blocker with history of bradycardia and fatigue Continue ranolazine and amlodipine Only taking ranexa once daily No chest pain ASA dropped, plavix monotherapy in the setting of eliquis   Hyperlipidemia with LDL goal < 70 11/02/2022: Cholesterol, Total 188; HDL 48; LDL Chol Calc (NIH) 121; Triglycerides 102 On Zetia and fenofibrate Statin intolerance, did not tolerate repatha Nexlitol added at last visit - never started due to cost   Hypertension Continue amlodipine, valsartan-HCTZ BP controlled   AAA with possible endoleak and aneurysmal sac VVS following   Follow up in 2-3 months     ADDENDUM: Per Dr. Herbie Baltimore, OK to change to ASA monotherapy in the setting of eliquis. Stop plavix.       Medication Adjustments/Labs and Tests Ordered: Current medicines are reviewed at length with the patient today.  Concerns regarding medicines are outlined above.  Orders Placed This Encounter  Procedures   CBC   Basic metabolic panel   Magnesium   TSH   EKG 12-Lead   ECHOCARDIOGRAM COMPLETE   Meds ordered this encounter  Medications   apixaban (ELIQUIS) 2.5 MG TABS tablet    Sig: Take 1 tablet (2.5 mg total) by mouth 2 (two) times daily.    Dispense:  180 tablet    Refill:  3   Choline Fenofibrate (FENOFIBRIC ACID) 135 MG CPDR    Sig: Take 1 tablet by mouth daily.    Dispense:  90 capsule    Refill:  3    Patient Instructions  Medication Instructions:   CONTINUE taking Eliquis (Apixban) 2.5 mg 2 times a day   *If you need a refill on your cardiac medications before your next appointment,  please call your pharmacy*  Lab Work: Your physician recommends that you have lab work TODAY:  BMP CBC Magnesium  TSH  If you have labs (blood work) drawn today and your tests are completely normal, you will receive your results only by: MyChart Message (if you have MyChart) OR A paper copy in the mail If you have any lab test that is abnormal or we need to change your treatment, we will call you to review the results.  Testing/Procedures: Your physician has requested that you have an echocardiogram. Echocardiography is a painless test that uses sound waves to create images of your heart. It provides your doctor with information about the size and shape of your heart and how well your heart's chambers and valves are working. This procedure takes approximately one hour. There are no restrictions for this procedure. Please do NOT wear cologne, perfume, aftershave, or lotions (deodorant is allowed). Please arrive 15 minutes prior to your appointment time.   Follow-Up: At [facility], you and your health needs are our priority.  As part of our continuing mission to provide you with exceptional heart care, we have created designated Provider Care Teams.  These Care Teams include your primary Cardiologist (physician) and Advanced Practice Providers (APPs -  Physician Assistants and Nurse Practitioners) who all work together to provide you with  the care you need, when you need it.  We recommend signing up for the patient portal called "MyChart".  Sign up information is provided on this After Visit Summary.  MyChart is used to connect with patients for Virtual Visits (Telemedicine).  Patients are able to view lab/test results, encounter notes, upcoming appointments, etc.  Non-urgent messages can be sent to your provider as well.   To learn more about what you can do with MyChart, go to [email or web address].    Your next appointment:   3-4 month(s)  Provider:   Bryan Lemma, MD   or  APP on a day Dr. Herbie Baltimore is in the office   Other Instructions     Signed, Marcelino Duster, [place]  03/30/2023 4:56 PM    [facility]

## 2023-03-30 ENCOUNTER — Encounter: Payer: Self-pay | Admitting: Physician Assistant

## 2023-03-30 ENCOUNTER — Other Ambulatory Visit: Payer: Self-pay | Admitting: Cardiology

## 2023-03-30 ENCOUNTER — Ambulatory Visit: Payer: Medicare Other | Attending: Physician Assistant | Admitting: Physician Assistant

## 2023-03-30 VITALS — BP 132/66 | HR 71 | Ht 69.0 in | Wt 209.0 lb

## 2023-03-30 DIAGNOSIS — I110 Hypertensive heart disease with heart failure: Secondary | ICD-10-CM

## 2023-03-30 DIAGNOSIS — Z7901 Long term (current) use of anticoagulants: Secondary | ICD-10-CM

## 2023-03-30 DIAGNOSIS — E785 Hyperlipidemia, unspecified: Secondary | ICD-10-CM | POA: Diagnosis not present

## 2023-03-30 DIAGNOSIS — I480 Paroxysmal atrial fibrillation: Secondary | ICD-10-CM | POA: Diagnosis not present

## 2023-03-30 DIAGNOSIS — I5032 Chronic diastolic (congestive) heart failure: Secondary | ICD-10-CM

## 2023-03-30 DIAGNOSIS — R011 Cardiac murmur, unspecified: Secondary | ICD-10-CM

## 2023-03-30 DIAGNOSIS — I10 Essential (primary) hypertension: Secondary | ICD-10-CM

## 2023-03-30 DIAGNOSIS — I25119 Atherosclerotic heart disease of native coronary artery with unspecified angina pectoris: Secondary | ICD-10-CM

## 2023-03-30 MED ORDER — APIXABAN 2.5 MG PO TABS: 2.5000 mg | ORAL_TABLET | Freq: Two times a day (BID) | ORAL | 3 refills | Status: DC

## 2023-03-30 MED ORDER — FENOFIBRIC ACID 135 MG PO CPDR
1.0000 | DELAYED_RELEASE_CAPSULE | Freq: Every day | ORAL | 3 refills | Status: DC
Start: 2023-03-30 — End: 2024-05-01

## 2023-03-30 NOTE — Patient Instructions (Addendum)
Medication Instructions:   CONTINUE taking Eliquis (Apixban) 2.5 mg 2 times a day   *If you need a refill on your cardiac medications before your next appointment, please call your pharmacy*  Lab Work: Your physician recommends that you have lab work TODAY:  BMP CBC Magnesium  TSH  If you have labs (blood work) drawn today and your tests are completely normal, you will receive your results only by: MyChart Message (if you have MyChart) OR A paper copy in the mail If you have any lab test that is abnormal or we need to change your treatment, we will call you to review the results.  Testing/Procedures: Your physician has requested that you have an echocardiogram. Echocardiography is a painless test that uses sound waves to create images of your heart. It provides your doctor with information about the size and shape of your heart and how well your heart's chambers and valves are working. This procedure takes approximately one hour. There are no restrictions for this procedure. Please do NOT wear cologne, perfume, aftershave, or lotions (deodorant is allowed). Please arrive 15 minutes prior to your appointment time.   Follow-Up: At [facility], you and your health needs are our priority.  As part of our continuing mission to provide you with exceptional heart care, we have created designated Provider Care Teams.  These Care Teams include your primary Cardiologist (physician) and Advanced Practice Providers (APPs -  Physician Assistants and Nurse Practitioners) who all work together to provide you with the care you need, when you need it.  We recommend signing up for the patient portal called "MyChart".  Sign up information is provided on this After Visit Summary.  MyChart is used to connect with patients for Virtual Visits (Telemedicine).  Patients are able to view lab/test results, encounter notes, upcoming appointments, etc.  Non-urgent messages can be sent to your provider as  well.   To learn more about what you can do with MyChart, go to [email or web address].    Your next appointment:   3-4 month(s)  Provider:   Bryan Lemma, MD   or APP on a day Dr. Herbie Baltimore is in the office   Other Instructions

## 2023-03-31 LAB — CBC
Hematocrit: 30.9 % — ABNORMAL LOW (ref 37.5–51.0)
Hemoglobin: 10 g/dL — ABNORMAL LOW (ref 13.0–17.7)
MCH: 31.5 pg (ref 26.6–33.0)
MCHC: 32.4 g/dL (ref 31.5–35.7)
MCV: 98 fL — ABNORMAL HIGH (ref 79–97)
Platelets: 236 10*3/uL (ref 150–450)
RBC: 3.17 x10E6/uL — ABNORMAL LOW (ref 4.14–5.80)
RDW: 14.3 % (ref 11.6–15.4)
WBC: 6.4 10*3/uL (ref 3.4–10.8)

## 2023-03-31 LAB — BASIC METABOLIC PANEL
BUN/Creatinine Ratio: 16 (ref 10–24)
BUN: 27 mg/dL (ref 8–27)
CO2: 17 mmol/L — ABNORMAL LOW (ref 20–29)
Calcium: 9.3 mg/dL (ref 8.6–10.2)
Chloride: 108 mmol/L — ABNORMAL HIGH (ref 96–106)
Creatinine, Ser: 1.71 mg/dL — ABNORMAL HIGH (ref 0.76–1.27)
Glucose: 93 mg/dL (ref 70–99)
Potassium: 4.6 mmol/L (ref 3.5–5.2)
Sodium: 140 mmol/L (ref 134–144)
eGFR: 39 mL/min/{1.73_m2} — ABNORMAL LOW (ref >59)

## 2023-03-31 LAB — MAGNESIUM: Magnesium: 2.1 mg/dL (ref 1.6–2.3)

## 2023-03-31 LAB — TSH: TSH: 5.98 u[IU]/mL — ABNORMAL HIGH (ref 0.450–4.500)

## 2023-04-04 ENCOUNTER — Telehealth: Payer: Self-pay

## 2023-04-04 NOTE — Telephone Encounter (Signed)
Lmom to discuss lab results. Waiting on a return call.  

## 2023-04-05 NOTE — Progress Notes (Signed)
Lmom for pts spouse. Waiting on a return call to discuss medication pt needs to d/c per Micah Flesher PA.

## 2023-04-06 ENCOUNTER — Encounter: Payer: Self-pay | Admitting: Cardiology

## 2023-04-06 NOTE — Telephone Encounter (Signed)
Error

## 2023-04-06 NOTE — Telephone Encounter (Signed)
Patient wife aware of lab results and provider recommendations. She verbalized understanding and repeated recommendations back to me.

## 2023-04-06 NOTE — Telephone Encounter (Signed)
Patient's wife is returning call and is requesting return call.  

## 2023-04-22 ENCOUNTER — Telehealth: Payer: Self-pay

## 2023-04-22 NOTE — Telephone Encounter (Signed)
Updated pts medication list. Added ASA 81 mg daily that pt is taking and removed Plavix as directed by Micah Flesher PA. Pt will continue his Eliquis 2.5 mg bid.

## 2023-04-22 NOTE — Progress Notes (Signed)
Spoke with pts spouse. Pt will d/c Plavix and continue ASA 81 mg with Eliquis.

## 2023-04-27 ENCOUNTER — Telehealth: Payer: Self-pay | Admitting: Cardiology

## 2023-04-27 ENCOUNTER — Ambulatory Visit (HOSPITAL_COMMUNITY): Payer: Medicare Other | Attending: Cardiovascular Disease

## 2023-04-27 DIAGNOSIS — I350 Nonrheumatic aortic (valve) stenosis: Secondary | ICD-10-CM

## 2023-04-27 DIAGNOSIS — I480 Paroxysmal atrial fibrillation: Secondary | ICD-10-CM | POA: Insufficient documentation

## 2023-04-27 LAB — ECHOCARDIOGRAM COMPLETE
AR max vel: 1.23 cm2
AV Area VTI: 1.22 cm2
AV Area mean vel: 1.14 cm2
AV Mean grad: 10.4 mmHg
AV Peak grad: 18.8 mmHg
Ao pk vel: 2.17 m/s
MV M vel: 5.26 m/s
MV Peak grad: 110.7 mmHg
Radius: 0.6 cm
S' Lateral: 5.8 cm

## 2023-04-27 NOTE — Telephone Encounter (Signed)
Pt c/o medication issue:  1. Name of Medication: apixaban (ELIQUIS) 2.5 MG TABS tablet   2. How are you currently taking this medication (dosage and times per day)?   3. Are you having a reaction (difficulty breathing--STAT)? Yes   4. What is your medication issue? Patient is having black stool and having dizziness with this medication. Wife requesting call back to discuss further. Please advise.

## 2023-04-27 NOTE — Telephone Encounter (Signed)
Wife states patient has started on Eliquis and he has been having black stools and dizziness. She states his BP has been normal. She is really concerned because of his balance and feels its because of Eliquis. Currently not experiencing dizziness at this time.  Looking over notes on 7/19 he was supposed to stop plavix and start  ASA 81mg . She did not stop plavix and still have only been giving him Eliquis once 2.5 mg daily. Did stress the importance of taking Eliquis BID. Informed her to discontinue plavix.  She would like to know how did you stop plavix and start him on Eliquis. She states Eliquis is to expensive.

## 2023-04-27 NOTE — Telephone Encounter (Signed)
This is Dr. Royann Shivers covering for Dr. Herbie Baltimore.  [name]'s electrocardiogram on June 26 showed atrial fibrillation.  This rhythm abnormality is common but is associated with a severely increased risk of stroke due to clots that formed inside the heart chambers.   Unfortunately clopidogrel (Plavix) does not protect from those types of strokes whereas Eliquis in the appropriate dose which would be 2.5 mg twice daily does protect from strokes.  If he is having black stools and dizziness, we need to check a CBC to make sure this is not a sign of intestinal bleeding.  Ideally will also get a Hemoccult stool card.  Unfortunately, all of the effective and safe blood thinners for atrial fibrillation are expensive (they are not generic).  The only cheaper medication that helps with atrial fibrillation related stroke risk is warfarin (Coumadin), but that has a lot of drawbacks and requires frequent blood testing.  For the time being, please discontinue the Eliquis and take only clopidogrel 75 mg once daily.  Once we get the results of the blood tests and the stool test, we will review the risks and benefits of the different blood thinners.  Please order CBC and Hemoccult stool card.

## 2023-04-28 NOTE — Telephone Encounter (Signed)
Left voicemail to return call to office.

## 2023-04-29 ENCOUNTER — Other Ambulatory Visit: Payer: Self-pay

## 2023-04-29 NOTE — Telephone Encounter (Signed)
Called pt back with instructions from the provider. Spoke with pt wife and advised that the pt needs to stop the Eliquis and have some blood work and hemoccult car done. Advised her that this nurse will put the orders in and they can come in anytime from 8-4 Monday through Friday. Wife verbalized understanding.

## 2023-04-29 NOTE — Telephone Encounter (Signed)
Pt returning nurses phone call. Please advise ?

## 2023-05-18 ENCOUNTER — Other Ambulatory Visit: Payer: Self-pay | Admitting: Cardiology

## 2023-05-24 ENCOUNTER — Other Ambulatory Visit: Payer: Self-pay | Admitting: Family Medicine

## 2023-05-24 ENCOUNTER — Telehealth: Payer: Self-pay | Admitting: Cardiology

## 2023-05-24 DIAGNOSIS — I110 Hypertensive heart disease with heart failure: Secondary | ICD-10-CM

## 2023-05-24 NOTE — Telephone Encounter (Signed)
Cannot authorize Plavix refill currently, he needed to have CBC checked and stool blood test first. Plavix does not protect from afib related strokes, the Eliquis would. Would relay Dr Croitoru's message from the 7/24 phone note.

## 2023-05-24 NOTE — Telephone Encounter (Signed)
Patient was coming to have labs today as his wife got confused in regards to directions.  He takes last plavix today.  Labs were to be done in order to refill.  Would you like him to have Plavix until labs are back?  Call in a short supply? Or hold until lab results returned?

## 2023-05-24 NOTE — Telephone Encounter (Signed)
Patient wife states he has not had not had labs done.  She did not remember the conversation to have labs done. She will have him come in today.   She states he has stopped the Eliquis.  He needs refill of Plavix as his last dose will be today.

## 2023-05-24 NOTE — Telephone Encounter (Signed)
Pt c/o medication issue:  1. Name of Medication:   clopidogrel (PLAVIX) 75 MG tablet   2. How are you currently taking this medication (dosage and times per day)?   3. Are you having a reaction (difficulty breathing--STAT)?   4. What is your medication issue?    Wife stated patient had stopped taking this medication and was put back on this medication along with aspirin on 6/26.  Wife wants to confirm patient should still be taking this medication as he only has 1 tablet left and wants a refill sent to [id number] - [place] ([address]), [place] - [address]

## 2023-05-25 LAB — CBC
Hemoglobin: 10.7 g/dL — ABNORMAL LOW (ref 13.0–17.7)
MCH: 31.3 pg (ref 26.6–33.0)
MCHC: 32.5 g/dL (ref 31.5–35.7)
WBC: 6.2 10*3/uL (ref 3.4–10.8)

## 2023-05-25 NOTE — Telephone Encounter (Signed)
With A-fib, I think he should just be on Eliquis 2.5 mg twice daily.  Can hold/stop aspirin and Plavix.  Bryan Lemma, MD

## 2023-05-25 NOTE — Telephone Encounter (Signed)
Call to patient with message from provider for medication change.  Lm to call office   With A-fib, I think he should just be on Eliquis 2.5 mg twice daily.  Can hold/stop aspirin and Plavix.   Bryan Lemma, MD

## 2023-05-25 NOTE — Telephone Encounter (Signed)
This is Dr. Elissa Hefty patient. In my opinion he should stop plavix and start Eliquis

## 2023-05-26 NOTE — Telephone Encounter (Signed)
Current guidelines and data would support switching from Plavix to a medicine like Eliquis or Xarelto in the setting of atrial fibrillation.  Plavix may reduce the risk of stroke by less than 50% where his medicines like Xarelto or Eliquis reduced to relatively 0.  Plavix was for his stents in his coronary artery disease.  In the presence of the need for using Eliquis or Xarelto we do not combine Plavix unless there is a recent stent.  That would increase the risk of bleeding which is why we wanted to stop aspirin and Plavix. . Although there are people to take Plavix in A-fib, this is probably because of a detailed discussion had between the provider and the patient understanding the risks that they are not really protected adequately for stroke prevention.  Bryan Lemma, MD

## 2023-05-26 NOTE — Telephone Encounter (Signed)
Pt's wife returning nurses call. Please advise.  

## 2023-05-26 NOTE — Progress Notes (Signed)
He should follow up with his PCP. There are many possible causes for anemia. Sometimes the cause can be easily identified with other labwork, sometimes it requires referral to a hematologist. FYI, this is Dr. Elissa Hefty patient. I was covering for him.

## 2023-05-26 NOTE — Telephone Encounter (Signed)
Wife is upset she states because he has been on plavix for a long time. She wants to know why does he need to take Eliquis. She knows people who has afib and is on plavix. She feels all the symptoms he was experiencing is coming from Eliquis.

## 2023-05-27 NOTE — Telephone Encounter (Signed)
Returned call to pt's wife. LVM with Dr. Elissa Hefty findings.

## 2023-05-27 NOTE — Telephone Encounter (Signed)
Wife Nicole Cella) returned staff call.

## 2023-05-27 NOTE — Telephone Encounter (Signed)
Returned call to pt's wife. LVM to return our call.

## 2023-05-30 NOTE — Telephone Encounter (Signed)
LM that we have made multiple attempts to return call regarding medication and to please cal our office asap

## 2023-05-30 NOTE — Telephone Encounter (Signed)
Wife is calling back, asking that we give them a call back. Please advise

## 2023-05-30 NOTE — Telephone Encounter (Signed)
Unable to contact patient or get call back.  Message with response from provider noted on letter and mailed to patient.

## 2023-06-02 ENCOUNTER — Other Ambulatory Visit: Payer: Self-pay | Admitting: Family Medicine

## 2023-06-02 DIAGNOSIS — I110 Hypertensive heart disease with heart failure: Secondary | ICD-10-CM

## 2023-06-14 LAB — HM DIABETES EYE EXAM

## 2023-06-17 ENCOUNTER — Other Ambulatory Visit: Payer: Self-pay | Admitting: Interventional Radiology

## 2023-06-17 DIAGNOSIS — I9789 Other postprocedural complications and disorders of the circulatory system, not elsewhere classified: Secondary | ICD-10-CM

## 2023-06-21 NOTE — Progress Notes (Signed)
Cardiology Office Note:    Date:  06/29/2023   ID:  [name], DOB [birth year], MRN [id number]  PCP:  Ronnald Nian, MD   [facility] Providers Cardiologist:  Bryan Lemma, MD Cardiology APP:  Marcelino Duster, [place] { Referring MD: Ronnald Nian, MD   Chief Complaint  Patient presents with   Edema    Left leg   Follow-up    Afib    History of Present Illness:    [name] is a 87 y.o. male with a hx of extensive cardiac history including CAD since 1985 with multiple stenting and ultimately a CABG in 1997 with patent LIMA-LAD, DM2, hypertension, CHF, EVAR for AAA followed by Dr. Randie Heinz, asthma, GERD, Afib (new 12/2022) and hyperlipidemia.  Heart cath in 05/2014 with DES to ISR of RCA and OM1.  Nuclear stress test in 05/2021 with intermediate risk and similar findings still prior study.  Echo 06/10/2021 showed LVEF with 45 to 50% and grade 1 diastolic dysfunction, WMA, mildly reduced RV function, severely dilated left atrium, mild to moderate MR, and mild AS.  He has been on Plavix monotherapy and is intolerant to statins.  He has not been interested in PCSK9 inhibitor or Nexletol.  He is maintained on Zetia and fenofibrate.  Antianginal regimen includes amlodipine and ranolazine.  He has CKD with serum creatinine 1.5-1.6.  He presented to [facility] ED on 08/19/2021 with chest pain.  He underwent left heart catheterization with continued patency of the LIMA-LAD but distal circumflex disease treated with DES.  He was started on aspirin and Brilinta.  He is not on a beta-blocker due to history of bradycardia.  Recent CT abdomen pelvis with possible persistent endoleak resulting in aneurysmal sac enlargement.  He was advised to follow-up with the [facility] as outpatient. I saw him post cath for TOC.   He presents for outpatient follow-up, TOC post cath. He was switched from brilinta to plavix for SOB. He was recommended for long-term plavix monotherapy after 12 months of DAPT.   Not  on BB due to fatigue Statin myopathy - on zetia and nexletol  SBP goal < 160 given age - on ARB and HCTZ  Last seen by Dr. Herbie Baltimore and continued to complain of fatigue. Avoiding BB. Felt related to balance issues causing increased effort. Vestibular therapy was recommended.   During a preoperative evaluation with anesthesia, EKG noted to be new A-fib.  He contacted cardiology office who confirmed atrial fibrillation and he was started on Eliquis 2.5 mg twice daily with discontinuation of aspirin. During follow up 03/2023, he was only taking eliquis once daily due to cost. I provided patient assistance.   Echo was obtained and showed persistent EF of 45-50%, mildly reduced RV function, elevated PASP, severe BAE, mild to moderate MR, mild AS.  Unfortunately he called our office with reports of black stool and was advised to stop Eliquis.  CBC was repeated and hemoglobin was 10.7, this was up from 10.0 when checked in June.  He was advised to follow-up with his PCP to evaluate anemia.  He called our office asking for refills of Plavix, however was advised to not take Plavix but continue Eliquis 2.5 mg twice daily.   He presents today for follow up. EKG with slow Afib. He is asymptomatic. No chest pain. His biggest complaint is balance issues - he has fallen several times due to balance, he adamantly denies dizziness.   Past Medical History:  Diagnosis Date  Adenomatous colon polyp    Ankle edema    Chronic   Asthma    CAD S/P percutaneous coronary angioplasty 2004   a) '04: Staged Taxus DES PCI to RCA (2 Taxus 2.75 x 32 & 12) and Cx-OM (Taxus 3 x 20);;'07 - PCI to pRCA ISR- Cypher DES; c) 05/2014: pPCI pRCA stentISR (Promus DES 3 x 8) OM1 distal to stent (Promus DES 2.75 x 12 - 3.1); 11/'22: distal LCx stent edge 99% -> Onyx Frontier DES 2.5 x 15 - 2.8 (overlaps prior stent)   CAD, multiple vessel 1985   Most recent July 2018: Admitted for non-STEMI. Occluded LAD with patent LIMA (SP1 now  occluded).  Patent stents in proximal and mid RCA as well as circumflex-OM 3. Known occlusion of SVG-OM. EF 45-50%   CHF (congestive heart failure) (HCC)    Cholelithiasis - with cholangitis & choledocholithiasis    status post ERCP with removal of calculi and biliary stent placement.   Chronic anemia    On iron supplement; history of positive guaiac - negative colonoscopy in 1996.; Thought to be related to hemorrhoids; status post hemorrhoidectomy   Chronic back pain     multiple surgeries; C-spine and lumbar   Diabetes mellitus    no meds- pt states he is no longer DM   Diverticulitis of colon 1996   Diverticulosis    Dyslipidemia, goal LDL below 70    Erectile dysfunction    Exertional dyspnea    Chronic baseline SOB with ambulation   GERD (gastroesophageal reflux disease)    Grade II diastolic dysfunction    H/O: pneumonia 11/2012   Hemorrhoids    Hiatal hernia    History of: ST elevation myocardial infarction (STEMI) involving left circumflex coronary artery with complication 1985   PTCA-circumflex; PCI in 1991   Hypertension    Moderate aortic stenosis by prior echocardiogram 07/2015   Normal LV function - EF 55-60%.. Abnormal relaxation. Mild-moderate aortic stenosis (peak/mean gradient 18/10 mmH)   Osteoarthritis of both knees    And back; multiple back surgeries, right knee arthroplasty and left knee arthroscopic surgery x2   Pneumonia 2016   S/P AAA (abdominal aortic aneurysm) repair 08/30/2012   s/p EVAR   S/P CABG x 2 1997   LIMA-LAD, SVG-OM   Sleep apnea    pt. states he was told to return for f/u, to be fitted for Cpap, but pt. reports that he didn't follow up-no cpap used   Statin myopathy 09/30/2015    Past Surgical History:  Procedure Laterality Date   Abdominal and Lower Extremity Arterial Ultrasound  08/23/2012; 10/10/2013   Normal ABIs. Nonocclusive lower extremity disease. 4.2 cm x 4.3 cm infrarenal AAA;; 4.4 cm x 4.3 cm (essentially stable)    ABDOMINAL  AORTIC ENDOVASCULAR STENT GRAFT N/A 08/13/2015   Procedure: ABDOMINAL AORTIC ENDOVASCULAR STENT GRAFT;  Surgeon: Pryor Ochoa, MD;  Location: [facility] OR;  Service: Vascular;  Laterality: N/A;   Anterior cervical plating  04/23/2010   At C4-5 and a C6-7 utilizing two separate Biomet MaxAn plates.   ANTERIOR LAT LUMBAR FUSION Left 11/22/2012   Procedure: ANTERIOR LATERAL LUMBAR FUSION 1 LEVEL;  Surgeon: Tia Alert, MD;  Location: [facility] NEURO ORS;  Service: Neurosurgery;  Laterality: Left;  Anterior Lateral Lumbar Fusion Lumbar Three-Four   BACK SURGERY  1979 & x 10   pt. remarks, "I have had about 10 back surgeries"   BILIARY STENT PLACEMENT N/A 07/03/2014   Procedure: BILIARY STENT PLACEMENT;  Surgeon: Louis Meckel, MD;  Location: [facility] ENDOSCOPY;  Service: Endoscopy;  Laterality: N/A;   CARPAL TUNNEL RELEASE Left 11/16/2019   Procedure: LEFT CARPAL TUNNEL RELEASE;  Surgeon: Kathryne Hitch, MD;  Location: [facility] ORS;  Service: Orthopedics;  Laterality: Left;   CARPAL TUNNEL RELEASE Right 01/10/2023   Procedure: RIGHT CARPAL TUNNEL RELEASE;  Surgeon: Tia Alert, MD;  Location: [facility] OR;  Service: Neurosurgery;  Laterality: Right;   CATARACT EXTRACTION     Cervical arthrodesis  04/23/2010   Anterior cervical arthrodesis, C4-5, C6-7 utilizing 7-mm PEEK interbody cage packed with local autograft & Antifuse putty at C4-5 & an 8-mm cage at C6-7.   CERVICAL DISCECTOMY  04/23/2010   Decompressive anterior carvical diskectomy. C4-5, C6-7   CHOLECYSTECTOMY N/A 05/23/2015   Procedure: LAPAROSCOPIC CHOLECYSTECTOMY WITH INTRAOPERATIVE CHOLANGIOGRAM;  Surgeon: Ovidio Kin, MD;  Location: [facility] ORS;  Service: General;  Laterality: N/A;   COLONOSCOPY  1996   CORONARY ANGIOPLASTY  1985,1991,15   1985 lateral STEMI Circumflex PTCA;    CORONARY ARTERY BYPASS GRAFT  1997   LIMA-LAD, SVG-OM (SVG known to be occluded prior to 2004)   CORONARY STENT INTERVENTION N/A 08/20/2021   Procedure: CORONARY STENT  INTERVENTION;  Surgeon: Runell Gess, MD;  Location: [facility] INVASIVE CV LAB;  Service: Cardiovascular:  dLCx distal Edge 99% => Onyx Frontier DES 2.5 x 15 (2.8 mm) overlaps prior stent distally.   ERCP N/A 07/03/2014   Procedure: ENDOSCOPIC RETROGRADE CHOLANGIOPANCREATOGRAPHY (ERCP);  Surgeon: Louis Meckel, MD;  Location: [facility] ENDOSCOPY;  Service: Endoscopy;  Laterality: N/A;   ERCP N/A 07/05/2014   Procedure: ENDOSCOPIC RETROGRADE CHOLANGIOPANCREATOGRAPHY (ERCP);  Surgeon: Louis Meckel, MD;  Location: [facility] ENDOSCOPY;  Service: Endoscopy;  Laterality: N/A;   ERCP N/A 06/30/2015   Procedure: ENDOSCOPIC RETROGRADE CHOLANGIOPANCREATOGRAPHY (ERCP);  Surgeon: Meryl Dare, MD;  Location: [facility] ENDOSCOPY;  Service: Endoscopy;  Laterality: N/A;   EYE SURGERY     IR ANGIOGRAM EXTREMITY LEFT  01/18/2017   IR ANGIOGRAM PELVIS SELECTIVE OR SUPRASELECTIVE  01/18/2017   IR ANGIOGRAM SELECTIVE EACH ADDITIONAL VESSEL  01/18/2017   IR AORTAGRAM ABDOMINAL SERIALOGRAM  01/18/2017   IR EMBO ARTERIAL NOT HEMORR HEMANG INC GUIDE ROADMAPPING  01/18/2017   IR RADIOLOGIST EVAL & MGMT  01/04/2017   IR RADIOLOGIST EVAL & MGMT  10/26/2017   IR RADIOLOGIST EVAL & MGMT  05/24/2018   IR RADIOLOGIST EVAL & MGMT  06/19/2019   IR RADIOLOGIST EVAL & MGMT  06/11/2020   IR RADIOLOGIST EVAL & MGMT  07/02/2021   IR RADIOLOGIST EVAL & MGMT  06/22/2022   IR US GUIDE VASC ACCESS RIGHT  01/18/2017   KNEE ARTHROSCOPY Left    x 2   LEFT HEART CATH AND CORS/GRAFTS ANGIOGRAPHY  06/2003   None Occluded vein graft to OM; diffuse RCA disease in the mid vessel, 80% circumflex-OM stenosis; follow on AV groove circumflex with sequential 90% stenoses and intervening saccular dilation    LEFT HEART CATH AND CORS/GRAFTS ANGIOGRAPHY  12/2008   4 abnormal Myoview showing apical thinning (possibly due to apical LAD 95%) : 100% Occluded LAD after SV1, distal LAD grafted via LIMA -apical 95% . Cx -OM1 w/patent stent extending into OM 1 . Follow on  Cx - 70-80% - non-amenable PCI. RCA widely patent 3 overlapping stents in mRCA w/less than 40% stenosisin RPL; SVG-OM known occluded    LEFT HEART CATH AND CORS/GRAFTS ANGIOGRAPHY N/A 04/20/2017   Procedure: Left Heart Cath and Cors/Grafts Angiography;  Surgeon:  Runell Gess, MD;  Location: [facility] INVASIVE CV LAB;  LAD now occluded prior to SP1.LIMA-LAD. Patent RCA and circumflex stents. EF 45-50%. Relatively stable.    LEFT HEART CATH AND CORS/GRAFTS ANGIOGRAPHY N/A 08/20/2021   Procedure: LEFT HEART CATH AND CORS/GRAFTS ANGIOGRAPHY;  Surgeon: Runell Gess, MD;  Location: [facility] INVASIVE CV LAB;  Service: Cardiovascular: CULPRIT 99% dLCx just after stent (Overlapping DES PCI) - otw patent LCx stent, patent RCA stent & Patent LIMA-LAD with CTO of prox LAD.   LEFT HEART CATHETERIZATION WITH CORONARY ANGIOGRAM N/A 05/15/2014   Procedure: LEFT HEART CATHETERIZATION WITH CORONARY ANGIOGRAM;  Surgeon: Lesleigh Noe, MD;  Location: [facility] CATH LAB;  Service: Cardiovascular;  Laterality: N/A;   LEFT HEART CATHETERIZATION WITH CORONARY/GRAFT ANGIOGRAM N/A 06/28/2014   Procedure: LEFT HEART CATHETERIZATION WITH Isabel Caprice;  Surgeon: Lennette Bihari, MD;  Location: [facility] CATH LAB;  Service: Cardiovascular;  Laterality: N/A;   LUMBAR PERCUTANEOUS PEDICLE SCREW 1 LEVEL N/A 11/22/2012   Procedure: LUMBAR PERCUTANEOUS PEDICLE SCREW 1 LEVEL;  Surgeon: Tia Alert, MD;  Location: [facility] NEURO ORS;  Service: Neurosurgery;  Laterality: N/A;  Lumbar Three-Four Percutaneous Pedicle Screw, Lateral approach   MINOR HEMORRHOIDECTOMY     NM MYOVIEW LTD  05/26/2021   Lexiscan: Intermediate risk (similar findings to last study), with exception of reduced EF roughly 40%..  Prior basal inferior-inferolateral infarct without peri-infarct ischemia.   ->  Echo ordered -> (EF 45 to 50%)-when compared to prior Myoview, infarct appears similar.   PERCUTANEOUS CORONARY STENT INTERVENTION (PCI-S)  06/2003   PCI - RCA 2  overlapping Taxus DES 2.75 mm x 32 mm and 2.75 mm x 12 mm (3.0 mm); PCI-Cx-OM1 - Taxus DES 3.0 mm x 20 mm (3.1 mm);    PERCUTANEOUS CORONARY STENT INTERVENTION (PCI-S)  12/2005   80% ISR in proximal Taxus stent in RCA -- covered proximally with Cypher DES 3.0 mm x 12 mm   PERCUTANEOUS CORONARY STENT INTERVENTION (PCI-S) N/A 05/17/2014   Procedure: PERCUTANEOUS CORONARY STENT INTERVENTION (PCI-S);  Surgeon: Lesleigh Noe, MD;  Location: [facility] CATH LAB: PCI pRCA stent ISR - Promus DES 3.0 x 8 (3.25), OM distal stent edge - Promus DES 2.75 x 12 (3.1)   PERIPHERAL VASCULAR CATHETERIZATION  11/03/2016   Procedure: Embolization;  Surgeon: Maeola Harman, MD;  Location: [facility] INVASIVE CV LAB;  Service: Cardiovascular;;   POSTERIOR CERVICAL FUSION/FORAMINOTOMY N/A 05/02/2013   Procedure: POSTERIOR CERVICAL FUSION/FORAMINOTOMY CERVICAL SEVEN THORACIC-ONE;  Surgeon: Tia Alert, MD;  Location: [facility] NEURO ORS;  Service: Neurosurgery;  Laterality: N/A;  POSTERIOR CERVICAL FUSION/FORAMINOTOMY CERVICAL SEVEN THORACIC-ONE   SPHINCTEROTOMY N/A 06/30/2015   Procedure: SPHINCTEROTOMY;  Surgeon: Meryl Dare, MD;  Location: [facility] ENDOSCOPY;  Service: Endoscopy;  Laterality: N/A;   TOTAL KNEE ARTHROPLASTY Right    TOTAL KNEE ARTHROPLASTY Left 12/20/2016   Procedure: LEFT TOTAL KNEE ARTHROPLASTY;  Surgeon: Dannielle Huh, MD;  Location: [facility] OR;  Service: Orthopedics;  Laterality: Left;   TRANSTHORACIC ECHOCARDIOGRAM  06/10/2021   EF 45 to 50%.  Mildly reduced function.  Mild hypokinesis of basal-mid inferior and inferolateral wall consistent with prior infarct.  Severe LA dilation.  Mild to moderate MR with moderate MAC.  Mild calcific AS (mean gradient 11 mmHg) --> LV function seems unchanged.  Diastolic function improved.  Wall motion abnormality unchanged.  AS unchanged.   TRANSTHORACIC ECHOCARDIOGRAM  07/2018   Normal LV size.  EF 50 to 55% with mild HK of inferolateral wall.  GRII DD.  Mild AS with mean  gradient 15 mm or greater.  Mild ascending aortic dilation.  Mildly increased PA pressures of 41 mmHg   UPPER GI ENDOSCOPY  07/03/2019   VISCERAL ANGIOGRAM  11/03/2016   Procedure: Visceral Angiogram;  Surgeon: Maeola Harman, MD;  Location: [facility] INVASIVE CV LAB;  Service: Cardiovascular;;    Current Medications: Current Meds  Medication Sig   acetaminophen (TYLENOL) 500 MG tablet Take 1,000 mg by mouth every 6 (six) hours as needed for mild pain, moderate pain or headache. Reported on 03/16/2016   amLODipine (NORVASC) 5 MG tablet Take 1 tablet by mouth once daily   apixaban (ELIQUIS) 2.5 MG TABS tablet Take 1 tablet (2.5 mg total) by mouth 2 (two) times daily.   Choline Fenofibrate (FENOFIBRIC ACID) 135 MG CPDR Take 1 tablet by mouth daily.   ezetimibe (ZETIA) 10 MG tablet Take 1 tablet by mouth once daily   Ferrous Sulfate (IRON PO) Take 65 mg by mouth daily.   furosemide (LASIX) 20 MG tablet Take 1 tablet (20 mg total) by mouth daily as needed.   glucose blood (ONETOUCH VERIO) test strip 1 each by Other route as needed for other. Use as instructed   Multiple Vitamins-Minerals (MULTIVITAMIN WITH MINERALS) tablet Take 1 tablet by mouth daily.   pantoprazole (PROTONIX) 40 MG tablet Take 1 tablet by mouth once daily   ranolazine (RANEXA) 500 MG 12 hr tablet Take 1 tablet by mouth twice daily   sildenafil (VIAGRA) 100 MG tablet TAKE ONE TABLET BY MOUTH DAILY AS NEEDED FOR ERECTILE DYSFUNCTION   valsartan-hydrochlorothiazide (DIOVAN-HCT) 320-12.5 MG tablet Take 1 tablet by mouth once daily     Allergies:   Oxycodone, Lisinopril, Statins, and Welchol [colesevelam hcl]   Social History   Socioeconomic History   Marital status: Married    Spouse name: Not on file   Number of children: Not on file   Years of education: Not on file   Highest education level: Not on file  Occupational History   Not on file  Tobacco Use   Smoking status: Former    Current packs/day: 0.00     Average packs/day: 3.0 packs/day for 45.0 years (135.0 ttl pk-yrs)    Types: Cigarettes    Start date: 10/04/1938    Quit date: 10/05/1983    Years since quitting: 39.7   Smokeless tobacco: Never  Vaping Use   Vaping status: Never Used  Substance and Sexual Activity   Alcohol use: Yes    Alcohol/week: 7.0 - 14.0 standard drinks of alcohol    Types: 7 - 14 Cans of beer per week    Comment: 1-2 beers per day   Drug use: No   Sexual activity: Yes  Other Topics Concern   Not on file  Social History Narrative   He is married, father of two, grandfather to four, great grandfather to two.    Not really getting much exercise now, do to his significant back and hip pain.    He does not smoke and only has an alcoholic beverage.    Social Determinants of Health   Financial Resource Strain: Not on file  Food Insecurity: Not on file  Transportation Needs: Not on file  Physical Activity: Not on file  Stress: Not on file  Social Connections: Not on file     Family History: The patient's family history includes Arthritis in his mother; Breast cancer in his sister; Diabetes in his father and sister; Heart disease  in his brother, father, and sister; Hypertension in his sister; Stroke in his sister; Ulcers in his brother. There is no history of Colon cancer.  ROS:   Please see the history of present illness.     All other systems reviewed and are negative.  EKGs/Labs/Other Studies Reviewed:    The following studies were reviewed today:  Echo 04/27/23:  1. Left ventricular ejection fraction, by estimation, is 45 to 50%. The  left ventricle has mildly decreased function. The left ventricle  demonstrates global hypokinesis. The left ventricular internal cavity size  was severely dilated. Left ventricular  diastolic function could not be evaluated.   2. Right ventricular systolic function is mildly reduced. The right  ventricular size is mildly enlarged. There is moderately elevated  pulmonary  artery systolic pressure. The estimated right ventricular  systolic pressure is 52.0 mmHg.   3. Left atrial size was severely dilated.   4. Right atrial size was mild to moderately dilated.   5. The mitral valve is degenerative. Mild to moderate mitral valve  regurgitation. No evidence of mitral stenosis.   6. The aortic valve is tricuspid. Aortic valve regurgitation is not  visualized. Mild aortic valve stenosis. Aortic valve mean gradient  measures 10.4 mmHg. Aortic valve Vmax measures 2.17 m/s.   7. The inferior vena cava is dilated in size with <50% respiratory  variability, suggesting right atrial pressure of 15 mmHg.   EKG Interpretation Date/Time:  Wednesday June 29 2023 15:21:34 EDT Ventricular Rate:  58 PR Interval:    QRS Duration:  112 QT Interval:  454 QTC Calculation: 445 R Axis:   16  Text Interpretation: Atrial fibrillation with slow ventricular response Nonspecific T wave abnormality When compared with ECG of 30-Mar-2023 15:44, Nonspecific T wave abnormality no longer evident in Anterior leads Confirmed by Micah Flesher ([id number]) on 06/29/2023 3:36:15 PM    Recent Labs: 12/29/2022: ALT 19 03/30/2023: BUN 27; Creatinine, Ser 1.71; Magnesium 2.1; Potassium 4.6; Sodium 140; TSH 5.980 05/24/2023: Hemoglobin 10.7; Platelets 199  Recent Lipid Panel    Component Value Date/Time   CHOL 188 11/02/2022 1130   TRIG 102 11/02/2022 1130   HDL 48 11/02/2022 1130   CHOLHDL 3.9 11/02/2022 1130   CHOLHDL 3.5 08/20/2021 0427   VLDL 17 08/20/2021 0427   LDLCALC 121 (H) 11/02/2022 1130     Risk Assessment/Calculations:    CHA2DS2-VASc Score = 5   This indicates a 7.2% annual risk of stroke. The patient's score is based upon: CHF History: 1 HTN History: 1 Diabetes History: 0 Stroke History: 0 Vascular Disease History: 1 Age Score: 2 Gender Score: 0        Physical Exam:    VS:  BP 132/72 (BP Location: Left Arm, Patient Position: Sitting, Cuff Size: Normal)   Pulse  (!) 58   Ht 5\' 9"  (1.753 m)   Wt 208 lb 11.2 oz (94.7 kg)   SpO2 99%   BMI 30.82 kg/m     Wt Readings from Last 3 Encounters:  06/29/23 208 lb 11.2 oz (94.7 kg)  03/30/23 209 lb (94.8 kg)  01/10/23 210 lb (95.3 kg)     GEN:  Well nourished, well developed in no acute distress HEENT: Normal NECK: No JVD; No carotid bruits LYMPHATICS: No lymphadenopathy CARDIAC: irregular rhythm, bradycardic rate, 3/6 murmur RESPIRATORY:  Clear to auscultation without rales, wheezing or rhonchi  ABDOMEN: Soft, non-tender, non-distended MUSCULOSKELETAL:  L > R B LE edema  SKIN: Warm and dry NEUROLOGIC:  Alert  and oriented x 3 PSYCHIATRIC:  Normal affect   ASSESSMENT:    1. Hypertensive heart disease with chronic diastolic congestive heart failure (HCC)   2. CAD S/P CABG '95- several PCis since, last 05/17/14   3. CAD S/P percutaneous coronary angioplasty   4. Balance problems   5. Chronic anticoagulation   6. Paroxysmal atrial fibrillation (HCC)   7. Mild aortic stenosis by prior echocardiogram   8. Hyperlipidemia LDL goal <70; statin intolerant   9. Primary hypertension   10. Cardiac murmur    PLAN:    In order of problems listed above:  Paroxysmal atrial fibrillation Noted during PAT clinic 12/2022 EKG today with Afib VR 58 Eliquis 2.5 mg BID breifly held for Hb 10, improved to 10.7, has not been rechecked after restarting eliquis, will check this today - upon further review, his Hb has fluctuated from 10-12.8 - will closely monitor   CAD with history of CABG 1997 DES to distal Lcx Has stopped antiplatelet therapy for [facility] No beta-blocker with history of bradycardia and fatigue Continue ranolazine and amlodipine   Hyperlipidemia with LDL goal < 70 11/02/2022: Cholesterol, Total 188; HDL 48; LDL Chol Calc (NIH) 121; Triglycerides 102 On Zetia and fenofibrate Statin intolerance, not interested in PCSK9 inhibitor Nexlitol added at last visit Tells me he did try repatha and didn't  tolerate it   Hypertension Continue amlodipine, valsartan-HCTZ   Lower extremity edema - is eating sodium - is not taking his PRN lasix - is sometimes elevates his legs - no crackles in exam - recommended low sodium and to take his lasix - we briefly discussed stopping amlodipine, will defer to next visit   Balance issues - I will submit PT referral for gait training - important to improve balance if possible given eliquis   Aortic stenosis Mild to moderate MR - continue to follow clinically   AAA with possible endoleak and aneurysmal sac [facility] -does not appear he has an appointment yet   Follow up in 6 months.       Medication Adjustments/Labs and Tests Ordered: Current medicines are reviewed at length with the patient today.  Concerns regarding medicines are outlined above.  Orders Placed This Encounter  Procedures   CBC   Ambulatory referral to Home Health   EKG 12-Lead   No orders of the defined types were placed in this encounter.   Patient Instructions  Medication Instructions:  Your physician has recommended you make the following change in your medication:   -Stop taking aspirin.  -Stop taking clopidogrel (plavix).   *If you need a refill on your cardiac medications before your next appointment, please call your pharmacy*   Lab Work: Your physician recommends that you have labs drawn today: CBC  If you have labs (blood work) drawn today and your tests are completely normal, you will receive your results only by: MyChart Message (if you have MyChart) OR A paper copy in the mail If you have any lab test that is abnormal or we need to change your treatment, we will call you to review the results.    Follow-Up: At [facility], you and your health needs are our priority.  As part of our continuing mission to provide you with exceptional heart care, we have created designated Provider Care Teams.  These Care Teams include your primary  Cardiologist (physician) and Advanced Practice Providers (APPs -  Physician Assistants and Nurse Practitioners) who all work together to provide you with the care you  need, when you need it.  We recommend signing up for the patient portal called "MyChart".  Sign up information is provided on this After Visit Summary.  MyChart is used to connect with patients for Virtual Visits (Telemedicine).  Patients are able to view lab/test results, encounter notes, upcoming appointments, etc.  Non-urgent messages can be sent to your provider as well.   To learn more about what you can do with MyChart, go to [email or web address].    Your next appointment:   6 month(s)  Provider:   Bryan Lemma, MD      Signed, Roe [place], [place]  06/29/2023 4:09 PM    [facility]

## 2023-06-29 ENCOUNTER — Ambulatory Visit: Payer: Medicare Other | Attending: Physician Assistant | Admitting: Physician Assistant

## 2023-06-29 ENCOUNTER — Encounter: Payer: Self-pay | Admitting: Physician Assistant

## 2023-06-29 VITALS — BP 132/72 | HR 58 | Ht 69.0 in | Wt 208.7 lb

## 2023-06-29 DIAGNOSIS — Z9861 Coronary angioplasty status: Secondary | ICD-10-CM

## 2023-06-29 DIAGNOSIS — I5032 Chronic diastolic (congestive) heart failure: Secondary | ICD-10-CM | POA: Diagnosis not present

## 2023-06-29 DIAGNOSIS — I110 Hypertensive heart disease with heart failure: Secondary | ICD-10-CM | POA: Diagnosis not present

## 2023-06-29 DIAGNOSIS — I2510 Atherosclerotic heart disease of native coronary artery without angina pectoris: Secondary | ICD-10-CM | POA: Diagnosis not present

## 2023-06-29 DIAGNOSIS — I25119 Atherosclerotic heart disease of native coronary artery with unspecified angina pectoris: Secondary | ICD-10-CM | POA: Diagnosis not present

## 2023-06-29 DIAGNOSIS — R011 Cardiac murmur, unspecified: Secondary | ICD-10-CM

## 2023-06-29 DIAGNOSIS — R2689 Other abnormalities of gait and mobility: Secondary | ICD-10-CM | POA: Diagnosis not present

## 2023-06-29 DIAGNOSIS — Z7901 Long term (current) use of anticoagulants: Secondary | ICD-10-CM

## 2023-06-29 DIAGNOSIS — I350 Nonrheumatic aortic (valve) stenosis: Secondary | ICD-10-CM

## 2023-06-29 DIAGNOSIS — I480 Paroxysmal atrial fibrillation: Secondary | ICD-10-CM

## 2023-06-29 DIAGNOSIS — I10 Essential (primary) hypertension: Secondary | ICD-10-CM

## 2023-06-29 DIAGNOSIS — E785 Hyperlipidemia, unspecified: Secondary | ICD-10-CM

## 2023-06-29 NOTE — Patient Instructions (Signed)
Medication Instructions:  Your physician has recommended you make the following change in your medication:   -Stop taking aspirin.  -Stop taking clopidogrel (plavix).   *If you need a refill on your cardiac medications before your next appointment, please call your pharmacy*   Lab Work: Your physician recommends that you have labs drawn today: CBC  If you have labs (blood work) drawn today and your tests are completely normal, you will receive your results only by: MyChart Message (if you have MyChart) OR A paper copy in the mail If you have any lab test that is abnormal or we need to change your treatment, we will call you to review the results.    Follow-Up: At [facility], you and your health needs are our priority.  As part of our continuing mission to provide you with exceptional heart care, we have created designated Provider Care Teams.  These Care Teams include your primary Cardiologist (physician) and Advanced Practice Providers (APPs -  Physician Assistants and Nurse Practitioners) who all work together to provide you with the care you need, when you need it.  We recommend signing up for the patient portal called "MyChart".  Sign up information is provided on this After Visit Summary.  MyChart is used to connect with patients for Virtual Visits (Telemedicine).  Patients are able to view lab/test results, encounter notes, upcoming appointments, etc.  Non-urgent messages can be sent to your provider as well.   To learn more about what you can do with MyChart, go to [email or web address].    Your next appointment:   6 month(s)  Provider:   Bryan Lemma, MD

## 2023-06-30 LAB — CBC
Hematocrit: 35.4 % — ABNORMAL LOW (ref 37.5–51.0)
Hemoglobin: 11.2 g/dL — ABNORMAL LOW (ref 13.0–17.7)
MCH: 30 pg (ref 26.6–33.0)
MCHC: 31.6 g/dL (ref 31.5–35.7)
MCV: 95 fL (ref 79–97)
Platelets: 193 10*3/uL (ref 150–450)
RBC: 3.73 x10E6/uL — ABNORMAL LOW (ref 4.14–5.80)
RDW: 13.2 % (ref 11.6–15.4)
WBC: 6.1 10*3/uL (ref 3.4–10.8)

## 2023-07-01 ENCOUNTER — Ambulatory Visit (HOSPITAL_COMMUNITY)
Admission: RE | Admit: 2023-07-01 | Discharge: 2023-07-01 | Disposition: A | Discharge status: Home / Self Care | Payer: Medicare Other | Source: Ambulatory Visit | Attending: Interventional Radiology | Admitting: Interventional Radiology

## 2023-07-01 DIAGNOSIS — T82330A Leakage of aortic (bifurcation) graft (replacement), initial encounter: Secondary | ICD-10-CM | POA: Diagnosis not present

## 2023-07-01 DIAGNOSIS — Y828 Other medical devices associated with adverse incidents: Secondary | ICD-10-CM | POA: Insufficient documentation

## 2023-07-01 DIAGNOSIS — I9789 Other postprocedural complications and disorders of the circulatory system, not elsewhere classified: Secondary | ICD-10-CM | POA: Insufficient documentation

## 2023-07-06 ENCOUNTER — Other Ambulatory Visit: Payer: Self-pay | Admitting: Cardiology

## 2023-07-08 ENCOUNTER — Ambulatory Visit
Admission: RE | Admit: 2023-07-08 | Discharge: 2023-07-08 | Disposition: A | Discharge status: Home / Self Care | Payer: Medicare Other | Source: Ambulatory Visit | Attending: Interventional Radiology | Admitting: Interventional Radiology

## 2023-07-08 DIAGNOSIS — I9789 Other postprocedural complications and disorders of the circulatory system, not elsewhere classified: Secondary | ICD-10-CM

## 2023-07-08 HISTORY — PX: IR RADIOLOGIST EVAL & MGMT: IMG5224

## 2023-07-08 NOTE — Progress Notes (Signed)
Chief Complaint: EVAR, with type 2 endoleak   Referring Physician(s): Dr. Randie Heinz.    PCP: Dr. Sharlot Gowda   History of Present Illness: [name] is an 87 y.o. male presenting to VIR clinic today as a scheduled follow up for his type II endoleak.     He is well known to our service, with his first referral to my partner, the late Dr. Bonnielee Haff, 01/04/2017.     He joins [place] today with his wife [name], via telemedicine visit.  We confirmed his identity with 2 personal identifiers.    Hx: He has history of AAA treated with infra-renal fixation 08/13/2015.  This was performed with a Gore Excluder, [phone] main body, via the right, and left limb of 14x10.  The pre-operative diameter was ~5cm.  He had a type II endoleak identified, with interval growth of the excluded sac.  The IMA was embolized via TA approach by Dr. Randie Heinz 11/03/2016, and then the L4 lumbar arteries were embolized by Dr. Bonnielee Haff 01/04/2017 for regrowth, to maximum diameter of 5.6cm.     Our surveillance CT scans were initiated June of 2018, which have essentially been annual.   The baseline of 2018 shows estimated diameter of ~81mm.     Interval:  [name] tells me again that he is feeling fine.  He denies any abdominal pain or back pain.  Despite his interval back surgery performed April of this year, he says he has no back pain.     His wife [name] adds that he has been having some urinary frequency, and asks whether this is related to the treated endoleak.  I assured her it is not, but did recommend that he is seek appointment by urology for urgency/LUTS complaint.     We have another non-contrast CT to review today.  By my estimate the sac has grown to about 69mm.  The CT in 2018 after his initial embolization for the endoleak was about 54mm.  Growth over 6 years of about 1.5cm.  I did share this with them.  (Chronic illness with progression)   Again, he is a gentleman with chronic renal disease, with last BMP in June 2024  showing Cr of 1.71 and clearance ~39.  CBC reviewed. All previous CTs reviewed.  Prior notes and surgery reviewed.   Past Medical History:  Diagnosis Date   Adenomatous colon polyp    Ankle edema    Chronic   Asthma    CAD S/P percutaneous coronary angioplasty 2004   a) '04: Staged Taxus DES PCI to RCA (2 Taxus 2.75 x 32 & 12) and Cx-OM (Taxus 3 x 20);;'07 - PCI to pRCA ISR- Cypher DES; c) 05/2014: pPCI pRCA stentISR (Promus DES 3 x 8) OM1 distal to stent (Promus DES 2.75 x 12 - 3.1); 11/'22: distal LCx stent edge 99% -> Onyx Frontier DES 2.5 x 15 - 2.8 (overlaps prior stent)   CAD, multiple vessel 1985   Most recent July 2018: Admitted for non-STEMI. Occluded LAD with patent LIMA (SP1 now occluded).  Patent stents in proximal and mid RCA as well as circumflex-OM 3. Known occlusion of SVG-OM. EF 45-50%   CHF (congestive heart failure) (HCC)    Cholelithiasis - with cholangitis & choledocholithiasis    status post ERCP with removal of calculi and biliary stent placement.   Chronic anemia    On iron supplement; history of positive guaiac - negative colonoscopy in 1996.; Thought to be related to hemorrhoids; status post hemorrhoidectomy  Chronic back pain     multiple surgeries; C-spine and lumbar   Diabetes mellitus    no meds- pt states he is no longer DM   Diverticulitis of colon 1996   Diverticulosis    Dyslipidemia, goal LDL below 70    Erectile dysfunction    Exertional dyspnea    Chronic baseline SOB with ambulation   GERD (gastroesophageal reflux disease)    Grade II diastolic dysfunction    H/O: pneumonia 11/2012   Hemorrhoids    Hiatal hernia    History of: ST elevation myocardial infarction (STEMI) involving left circumflex coronary artery with complication 1985   PTCA-circumflex; PCI in 1991   Hypertension    Moderate aortic stenosis by prior echocardiogram 07/2015   Normal LV function - EF 55-60%.. Abnormal relaxation. Mild-moderate aortic stenosis (peak/mean gradient  18/10 mmH)   Osteoarthritis of both knees    And back; multiple back surgeries, right knee arthroplasty and left knee arthroscopic surgery x2   Pneumonia 2016   S/P AAA (abdominal aortic aneurysm) repair 08/30/2012   s/p EVAR   S/P CABG x 2 1997   LIMA-LAD, SVG-OM   Sleep apnea    pt. states he was told to return for f/u, to be fitted for Cpap, but pt. reports that he didn't follow up-no cpap used   Statin myopathy 09/30/2015    Past Surgical History:  Procedure Laterality Date   Abdominal and Lower Extremity Arterial Ultrasound  08/23/2012; 10/10/2013   Normal ABIs. Nonocclusive lower extremity disease. 4.2 cm x 4.3 cm infrarenal AAA;; 4.4 cm x 4.3 cm (essentially stable)    ABDOMINAL AORTIC ENDOVASCULAR STENT GRAFT N/A 08/13/2015   Procedure: ABDOMINAL AORTIC ENDOVASCULAR STENT GRAFT;  Surgeon: Pryor Ochoa, MD;  Location: [facility] OR;  Service: Vascular;  Laterality: N/A;   Anterior cervical plating  04/23/2010   At C4-5 and a C6-7 utilizing two separate Biomet MaxAn plates.   ANTERIOR LAT LUMBAR FUSION Left 11/22/2012   Procedure: ANTERIOR LATERAL LUMBAR FUSION 1 LEVEL;  Surgeon: Tia Alert, MD;  Location: [facility] NEURO ORS;  Service: Neurosurgery;  Laterality: Left;  Anterior Lateral Lumbar Fusion Lumbar Three-Four   BACK SURGERY  1979 & x 10   pt. remarks, "I have had about 10 back surgeries"   BILIARY STENT PLACEMENT N/A 07/03/2014   Procedure: BILIARY STENT PLACEMENT;  Surgeon: Louis Meckel, MD;  Location: [facility] ENDOSCOPY;  Service: Endoscopy;  Laterality: N/A;   CARPAL TUNNEL RELEASE Left 11/16/2019   Procedure: LEFT CARPAL TUNNEL RELEASE;  Surgeon: Kathryne Hitch, MD;  Location: [facility] ORS;  Service: Orthopedics;  Laterality: Left;   CARPAL TUNNEL RELEASE Right 01/10/2023   Procedure: RIGHT CARPAL TUNNEL RELEASE;  Surgeon: Tia Alert, MD;  Location: [facility] OR;  Service: Neurosurgery;  Laterality: Right;   CATARACT EXTRACTION     Cervical arthrodesis  04/23/2010   Anterior  cervical arthrodesis, C4-5, C6-7 utilizing 7-mm PEEK interbody cage packed with local autograft & Antifuse putty at C4-5 & an 8-mm cage at C6-7.   CERVICAL DISCECTOMY  04/23/2010   Decompressive anterior carvical diskectomy. C4-5, C6-7   CHOLECYSTECTOMY N/A 05/23/2015   Procedure: LAPAROSCOPIC CHOLECYSTECTOMY WITH INTRAOPERATIVE CHOLANGIOGRAM;  Surgeon: Ovidio Kin, MD;  Location: [facility] ORS;  Service: General;  Laterality: N/A;   COLONOSCOPY  1996   CORONARY ANGIOPLASTY  1985,1991,15   1985 lateral STEMI Circumflex PTCA;    CORONARY ARTERY BYPASS GRAFT  1997   LIMA-LAD, SVG-OM (SVG known to be occluded prior to 2004)  CORONARY STENT INTERVENTION N/A 08/20/2021   Procedure: CORONARY STENT INTERVENTION;  Surgeon: Runell Gess, MD;  Location: [facility] INVASIVE CV LAB;  Service: Cardiovascular:  dLCx distal Edge 99% => Onyx Frontier DES 2.5 x 15 (2.8 mm) overlaps prior stent distally.   ERCP N/A 07/03/2014   Procedure: ENDOSCOPIC RETROGRADE CHOLANGIOPANCREATOGRAPHY (ERCP);  Surgeon: Louis Meckel, MD;  Location: [facility] ENDOSCOPY;  Service: Endoscopy;  Laterality: N/A;   ERCP N/A 07/05/2014   Procedure: ENDOSCOPIC RETROGRADE CHOLANGIOPANCREATOGRAPHY (ERCP);  Surgeon: Louis Meckel, MD;  Location: [facility] ENDOSCOPY;  Service: Endoscopy;  Laterality: N/A;   ERCP N/A 06/30/2015   Procedure: ENDOSCOPIC RETROGRADE CHOLANGIOPANCREATOGRAPHY (ERCP);  Surgeon: Meryl Dare, MD;  Location: [facility] ENDOSCOPY;  Service: Endoscopy;  Laterality: N/A;   EYE SURGERY     IR ANGIOGRAM EXTREMITY LEFT  01/18/2017   IR ANGIOGRAM PELVIS SELECTIVE OR SUPRASELECTIVE  01/18/2017   IR ANGIOGRAM SELECTIVE EACH ADDITIONAL VESSEL  01/18/2017   IR AORTAGRAM ABDOMINAL SERIALOGRAM  01/18/2017   IR EMBO ARTERIAL NOT HEMORR HEMANG INC GUIDE ROADMAPPING  01/18/2017   IR RADIOLOGIST EVAL & MGMT  01/04/2017   IR RADIOLOGIST EVAL & MGMT  10/26/2017   IR RADIOLOGIST EVAL & MGMT  05/24/2018   IR RADIOLOGIST EVAL & MGMT  06/19/2019   IR  RADIOLOGIST EVAL & MGMT  06/11/2020   IR RADIOLOGIST EVAL & MGMT  07/02/2021   IR RADIOLOGIST EVAL & MGMT  06/22/2022   IR US GUIDE VASC ACCESS RIGHT  01/18/2017   KNEE ARTHROSCOPY Left    x 2   LEFT HEART CATH AND CORS/GRAFTS ANGIOGRAPHY  06/2003   None Occluded vein graft to OM; diffuse RCA disease in the mid vessel, 80% circumflex-OM stenosis; follow on AV groove circumflex with sequential 90% stenoses and intervening saccular dilation    LEFT HEART CATH AND CORS/GRAFTS ANGIOGRAPHY  12/2008   4 abnormal Myoview showing apical thinning (possibly due to apical LAD 95%) : 100% Occluded LAD after SV1, distal LAD grafted via LIMA -apical 95% . Cx -OM1 w/patent stent extending into OM 1 . Follow on Cx - 70-80% - non-amenable PCI. RCA widely patent 3 overlapping stents in mRCA w/less than 40% stenosisin RPL; SVG-OM known occluded    LEFT HEART CATH AND CORS/GRAFTS ANGIOGRAPHY N/A 04/20/2017   Procedure: Left Heart Cath and Cors/Grafts Angiography;  Surgeon: Runell Gess, MD;  Location: [facility] INVASIVE CV LAB;  LAD now occluded prior to SP1.LIMA-LAD. Patent RCA and circumflex stents. EF 45-50%. Relatively stable.    LEFT HEART CATH AND CORS/GRAFTS ANGIOGRAPHY N/A 08/20/2021   Procedure: LEFT HEART CATH AND CORS/GRAFTS ANGIOGRAPHY;  Surgeon: Runell Gess, MD;  Location: [facility] INVASIVE CV LAB;  Service: Cardiovascular: CULPRIT 99% dLCx just after stent (Overlapping DES PCI) - otw patent LCx stent, patent RCA stent & Patent LIMA-LAD with CTO of prox LAD.   LEFT HEART CATHETERIZATION WITH CORONARY ANGIOGRAM N/A 05/15/2014   Procedure: LEFT HEART CATHETERIZATION WITH CORONARY ANGIOGRAM;  Surgeon: Lesleigh Noe, MD;  Location: [facility] CATH LAB;  Service: Cardiovascular;  Laterality: N/A;   LEFT HEART CATHETERIZATION WITH CORONARY/GRAFT ANGIOGRAM N/A 06/28/2014   Procedure: LEFT HEART CATHETERIZATION WITH Isabel Caprice;  Surgeon: Lennette Bihari, MD;  Location: [facility] CATH LAB;  Service: Cardiovascular;   Laterality: N/A;   LUMBAR PERCUTANEOUS PEDICLE SCREW 1 LEVEL N/A 11/22/2012   Procedure: LUMBAR PERCUTANEOUS PEDICLE SCREW 1 LEVEL;  Surgeon: Tia Alert, MD;  Location: [facility] NEURO ORS;  Service: Neurosurgery;  Laterality: N/A;  Lumbar  Three-Four Percutaneous Pedicle Screw, Lateral approach   MINOR HEMORRHOIDECTOMY     NM MYOVIEW LTD  05/26/2021   Lexiscan: Intermediate risk (similar findings to last study), with exception of reduced EF roughly 40%..  Prior basal inferior-inferolateral infarct without peri-infarct ischemia.   ->  Echo ordered -> (EF 45 to 50%)-when compared to prior Myoview, infarct appears similar.   PERCUTANEOUS CORONARY STENT INTERVENTION (PCI-S)  06/2003   PCI - RCA 2 overlapping Taxus DES 2.75 mm x 32 mm and 2.75 mm x 12 mm (3.0 mm); PCI-Cx-OM1 - Taxus DES 3.0 mm x 20 mm (3.1 mm);    PERCUTANEOUS CORONARY STENT INTERVENTION (PCI-S)  12/2005   80% ISR in proximal Taxus stent in RCA -- covered proximally with Cypher DES 3.0 mm x 12 mm   PERCUTANEOUS CORONARY STENT INTERVENTION (PCI-S) N/A 05/17/2014   Procedure: PERCUTANEOUS CORONARY STENT INTERVENTION (PCI-S);  Surgeon: Lesleigh Noe, MD;  Location: [facility] CATH LAB: PCI pRCA stent ISR - Promus DES 3.0 x 8 (3.25), OM distal stent edge - Promus DES 2.75 x 12 (3.1)   PERIPHERAL VASCULAR CATHETERIZATION  11/03/2016   Procedure: Embolization;  Surgeon: Maeola Harman, MD;  Location: [facility] INVASIVE CV LAB;  Service: Cardiovascular;;   POSTERIOR CERVICAL FUSION/FORAMINOTOMY N/A 05/02/2013   Procedure: POSTERIOR CERVICAL FUSION/FORAMINOTOMY CERVICAL SEVEN THORACIC-ONE;  Surgeon: Tia Alert, MD;  Location: [facility] NEURO ORS;  Service: Neurosurgery;  Laterality: N/A;  POSTERIOR CERVICAL FUSION/FORAMINOTOMY CERVICAL SEVEN THORACIC-ONE   SPHINCTEROTOMY N/A 06/30/2015   Procedure: SPHINCTEROTOMY;  Surgeon: Meryl Dare, MD;  Location: [facility] ENDOSCOPY;  Service: Endoscopy;  Laterality: N/A;   TOTAL KNEE ARTHROPLASTY Right    TOTAL KNEE  ARTHROPLASTY Left 12/20/2016   Procedure: LEFT TOTAL KNEE ARTHROPLASTY;  Surgeon: Dannielle Huh, MD;  Location: [facility] OR;  Service: Orthopedics;  Laterality: Left;   TRANSTHORACIC ECHOCARDIOGRAM  06/10/2021   EF 45 to 50%.  Mildly reduced function.  Mild hypokinesis of basal-mid inferior and inferolateral wall consistent with prior infarct.  Severe LA dilation.  Mild to moderate Julian with moderate MAC.  Mild calcific AS (mean gradient 11 mmHg) --> LV function seems unchanged.  Diastolic function improved.  Wall motion abnormality unchanged.  AS unchanged.   TRANSTHORACIC ECHOCARDIOGRAM  07/2018   Normal LV size.  EF 50 to 55% with mild HK of inferolateral wall.  GRII DD.  Mild AS with mean gradient 15 mm or greater.  Mild ascending aortic dilation.  Mildly increased PA pressures of 41 mmHg   UPPER GI ENDOSCOPY  07/03/2019   VISCERAL ANGIOGRAM  11/03/2016   Procedure: Visceral Angiogram;  Surgeon: Maeola Harman, MD;  Location: [facility] INVASIVE CV LAB;  Service: Cardiovascular;;    Allergies: Oxycodone, Lisinopril, Statins, and Welchol [colesevelam hcl]  Medications: Prior to Admission medications   Medication Sig Start Date End Date Taking? Authorizing Provider  acetaminophen (TYLENOL) 500 MG tablet Take 1,000 mg by mouth every 6 (six) hours as needed for mild pain, moderate pain or headache. Reported on 03/16/2016    [provider]  amLODipine (NORVASC) 5 MG tablet Take 1 tablet by mouth once daily 09/22/22   Marykay Lex, MD  apixaban (ELIQUIS) 2.5 MG TABS tablet Take 1 tablet (2.5 mg total) by mouth 2 (two) times daily. 03/30/23   Duke, Roe Rutherford, PA  Choline Fenofibrate (FENOFIBRIC ACID) 135 MG CPDR Take 1 tablet by mouth daily. 03/30/23   Duke, Roe Rutherford, PA  ezetimibe (ZETIA) 10 MG tablet Take 1 tablet by mouth  once daily 03/01/23   Swinyer, Zachary George, NP  Ferrous Sulfate (IRON PO) Take 65 mg by mouth daily.    [provider]  furosemide (LASIX) 20 MG tablet  Take 1 tablet (20 mg total) by mouth daily as needed. 02/15/22   Marykay Lex, MD  glucose blood [facility] VERIO) test strip 1 each by Other route as needed for other. Use as instructed 06/01/22   Ronnald Nian, MD  Multiple Vitamins-Minerals (MULTIVITAMIN WITH MINERALS) tablet Take 1 tablet by mouth daily.    [provider]  pantoprazole (PROTONIX) 40 MG tablet Take 1 tablet by mouth once daily 07/06/23   Marykay Lex, MD  ranolazine (RANEXA) 500 MG 12 hr tablet Take 1 tablet by mouth twice daily 11/15/22   Marykay Lex, MD  sildenafil (VIAGRA) 100 MG tablet TAKE ONE TABLET BY MOUTH DAILY AS NEEDED FOR ERECTILE DYSFUNCTION 10/27/21   Ronnald Nian, MD  valsartan-hydrochlorothiazide (DIOVAN-HCT) 320-12.5 MG tablet Take 1 tablet by mouth once daily 06/02/23   Ronnald Nian, MD     Family History  Problem Relation Age of Onset   Arthritis Mother    Diabetes Father    Heart disease Father    Stroke Sister    Hypertension Sister    Heart disease Sister    Diabetes Sister    Breast cancer Sister    Heart disease Brother    Ulcers Brother    Colon cancer Neg Hx     Social History   Socioeconomic History   Marital status: Married    Spouse name: Not on file   Number of children: Not on file   Years of education: Not on file   Highest education level: Not on file  Occupational History   Not on file  Tobacco Use   Smoking status: Former    Current packs/day: 0.00    Average packs/day: 3.0 packs/day for 45.0 years (135.0 ttl pk-yrs)    Types: Cigarettes    Start date: 10/04/1938    Quit date: 10/05/1983    Years since quitting: 39.7   Smokeless tobacco: Never  Vaping Use   Vaping status: Never Used  Substance and Sexual Activity   Alcohol use: Yes    Alcohol/week: 7.0 - 14.0 standard drinks of alcohol    Types: 7 - 14 Cans of beer per week    Comment: 1-2 beers per day   Drug use: No   Sexual activity: Yes  Other Topics Concern   Not on file  Social  History Narrative   He is married, father of two, grandfather to four, great grandfather to two.    Not really getting much exercise now, do to his significant back and hip pain.    He does not smoke and only has an alcoholic beverage.    Social Determinants of Health   Financial Resource Strain: Not on file  Food Insecurity: Not on file  Transportation Needs: Not on file  Physical Activity: Not on file  Stress: Not on file  Social Connections: Not on file       Review of Systems: A 12 point ROS discussed and pertinent positives are indicated in the HPI above.  All other systems are negative.  Review of Systems  Vital Signs: There were no vitals taken for this visit.  Advance Care Plan: The advanced care plan/surrogate decision maker was discussed at the time of visit and documented in the medical record.    Physical Exam  Mallampati Score:     Imaging: No results found.  Labs:  CBC: Recent Labs    12/29/22 1247 03/30/23 1641 05/24/23 1151 06/29/23 1615  WBC 5.6 6.4 6.2 6.1  HGB 12.3* 10.0* 10.7* 11.2*  HCT 38.9* 30.9* 32.9* 35.4*  PLT 189 236 199 193    COAGS: Recent Labs    12/29/22 1247  INR 1.1    BMP: Recent Labs    11/02/22 1130 12/29/22 1247 03/30/23 1641  NA 141 139 140  K 5.1 4.6 4.6  CL 104 102 108*  CO2 21 23 17*  GLUCOSE 98 99 93  BUN 22 24* 27  CALCIUM 9.7 9.5 9.3  CREATININE 1.58* 1.60* 1.71*  GFRNONAA  --  42*  --     LIVER FUNCTION TESTS: Recent Labs    11/02/22 1130 12/29/22 1247  BILITOT 0.6 1.1  AST 19 27  ALT 14 19  ALKPHOS 45 32*  PROT 6.6 6.5  ALBUMIN 4.3 3.7    TUMOR MARKERS: No results for input(s): "AFPTM", "CEA", "CA199", "CHROMGRNA" in the last 8760 hours.  Assessment and Plan:   Julian Milo is an 87 yo male with prior AAA repair with EVAR, SP embolization of both the IMA and lumbar arteries, and slowly enlarging excluded aneurysm sac, likely from persisting type II leak.    The change has been  very slow over time, ~1.5cm over 6 years, by my assessment.  He remains asymptomatic.   Most recent CT shows estimated size of ~56mm, with baseline of ~81mm  Today, I had another discussion with him regarding the reason for treatment of AAA, the known rate of increased intervention after endovascular repair, and the need for surveillance in these such cases of endoleak.    The accepted risk of late rupture from [employer] data is ~ 2% or less overall risk.     I did let him know that in this scenario where we are confident of ongoing type 2 leak contributing to sac enlargement, either endovascular or percutaneous treatment with possible embolization is recommended.  Even though the risk is low, there is a real risk of late sac rupture, which can be catastrophic. As at our last visit, he continues to be not motivated for repeat procedures/surgeries at this time. Some of this is related to his known chronic renal disease and the risk for contrast induced nephropathy during a treatment.   He would prefer continued conservative care with surveillance.     He knows that should he have any new abdominal pain or back pain, or any other concerning symptoms, he should come to [facility] ED for care.    Plan: - Continue conservative surveillance.   We will schedule another follow up office visit in 1 year, with repeat non-contrast CT scan abd/pelvis for surveillance given his CKD.  - continue medical care  Electronically Signed: Gilmer Mor 07/08/2023, 12:27 PM   I spent a total of    40 Minutes in asynchronous clinical consultation, reviewing prior labs, notes, surgical notes, previous imaging, additional history, documentation, greater than 50% of which was counseling/coordinating care for treated AAA with endograft, endoleak with late sac expansion, continued surveillance.

## 2023-08-05 ENCOUNTER — Telehealth (INDEPENDENT_AMBULATORY_CARE_PROVIDER_SITE_OTHER): Payer: Medicare Other | Admitting: Medical

## 2023-08-05 VITALS — Wt 207.0 lb

## 2023-08-05 DIAGNOSIS — R051 Acute cough: Secondary | ICD-10-CM

## 2023-08-05 MED ORDER — HYDROCODONE BIT-HOMATROP MBR 5-1.5 MG/5ML PO SOLN: 5.0000 mL | Freq: Three times a day (TID) | ORAL | 0 refills | Status: AC | PRN

## 2023-08-05 NOTE — Progress Notes (Signed)
Subjective:     Patient ID: [name], male   DOB: 04-05-1936, 87 y.o.   MRN: [id number]  This visit type was conducted due to national recommendations for restrictions regarding the COVID-19 Pandemic (e.g. social distancing) in an effort to limit this patient's exposure and mitigate transmission in our community.  Due to their co-morbid illnesses, this patient is at least at moderate risk for complications without adequate follow up.  This format is felt to be most appropriate for this patient at this time.    Documentation for virtual audio and video telecommunications through [facility] encounter:  The patient was located at home. The provider was located in the office. The patient did consent to this visit and is aware of possible charges through their insurance for this visit.  The other persons participating in this telemedicine service were none. Time spent on call was 20 minutes and in review of previous records 20 minutes total.  This virtual service is not related to other E/M service within previous 7 days.   HPI Chief Complaint  Patient presents with   cough    Cough, Giving mucinex DM and all the time.    Virtual visit for illness.  He reports a cough that started last night.   Using some mucinex DM.  Water intake is good.  No headache, no sinus pressure.  Nose is runny.  No sneezing.   No eye symptoms.   No fever, no body ache or chills, no sore throat.  No nausea or vomiting.  Has some loose stool.   No sob or wheezing.   One of this granddaughter's has been sick.  Has not done a covid test.  No other aggravating or relieving factors. No other complaint.  Past Medical History:  Diagnosis Date   Adenomatous colon polyp    Ankle edema    Chronic   Asthma    CAD S/P percutaneous coronary angioplasty 2004   a) '04: Staged Taxus DES PCI to RCA (2 Taxus 2.75 x 32 & 12) and Cx-OM (Taxus 3 x 20);;'07 - PCI to pRCA ISR- Cypher DES; c) 05/2014: pPCI pRCA stentISR (Promus  DES 3 x 8) OM1 distal to stent (Promus DES 2.75 x 12 - 3.1); 11/'22: distal LCx stent edge 99% -> Onyx Frontier DES 2.5 x 15 - 2.8 (overlaps prior stent)   CAD, multiple vessel 1985   Most recent July 2018: Admitted for non-STEMI. Occluded LAD with patent LIMA (SP1 now occluded).  Patent stents in proximal and mid RCA as well as circumflex-OM 3. Known occlusion of SVG-OM. EF 45-50%   CHF (congestive heart failure) (HCC)    Cholelithiasis - with cholangitis & choledocholithiasis    status post ERCP with removal of calculi and biliary stent placement.   Chronic anemia    On iron supplement; history of positive guaiac - negative colonoscopy in 1996.; Thought to be related to hemorrhoids; status post hemorrhoidectomy   Chronic back pain     multiple surgeries; C-spine and lumbar   Diabetes mellitus    no meds- pt states he is no longer DM   Diverticulitis of colon 1996   Diverticulosis    Dyslipidemia, goal LDL below 70    Erectile dysfunction    Exertional dyspnea    Chronic baseline SOB with ambulation   GERD (gastroesophageal reflux disease)    Grade II diastolic dysfunction    H/O: pneumonia 11/2012   Hemorrhoids    Hiatal hernia    History of: ST  elevation myocardial infarction (STEMI) involving left circumflex coronary artery with complication 1985   PTCA-circumflex; PCI in 1991   Hypertension    Moderate aortic stenosis by prior echocardiogram 07/2015   Normal LV function - EF 55-60%.. Abnormal relaxation. Mild-moderate aortic stenosis (peak/mean gradient 18/10 mmH)   Osteoarthritis of both knees    And back; multiple back surgeries, right knee arthroplasty and left knee arthroscopic surgery x2   Pneumonia 2016   S/P AAA (abdominal aortic aneurysm) repair 08/30/2012   s/p EVAR   S/P CABG x 2 1997   LIMA-LAD, SVG-OM   Sleep apnea    pt. states he was told to return for f/u, to be fitted for Cpap, but pt. reports that he didn't follow up-no cpap used   Statin myopathy 09/30/2015    Current Outpatient Medications on File Prior to Visit  Medication Sig Dispense Refill   acetaminophen (TYLENOL) 500 MG tablet Take 1,000 mg by mouth every 6 (six) hours as needed for mild pain, moderate pain or headache. Reported on 03/16/2016     amLODipine (NORVASC) 5 MG tablet Take 1 tablet by mouth once daily 90 tablet 3   apixaban (ELIQUIS) 2.5 MG TABS tablet Take 1 tablet (2.5 mg total) by mouth 2 (two) times daily. 180 tablet 3   Choline Fenofibrate (FENOFIBRIC ACID) 135 MG CPDR Take 1 tablet by mouth daily. 90 capsule 3   ezetimibe (ZETIA) 10 MG tablet Take 1 tablet by mouth once daily 60 tablet 2   Ferrous Sulfate (IRON PO) Take 65 mg by mouth daily.     furosemide (LASIX) 20 MG tablet Take 1 tablet (20 mg total) by mouth daily as needed. 30 tablet 6   Multiple Vitamins-Minerals (MULTIVITAMIN WITH MINERALS) tablet Take 1 tablet by mouth daily.     pantoprazole (PROTONIX) 40 MG tablet Take 1 tablet by mouth once daily 90 tablet 4   ranolazine (RANEXA) 500 MG 12 hr tablet Take 1 tablet by mouth twice daily 90 tablet 3   sildenafil (VIAGRA) 100 MG tablet TAKE ONE TABLET BY MOUTH DAILY AS NEEDED FOR ERECTILE DYSFUNCTION 90 tablet 1   valsartan-hydrochlorothiazide (DIOVAN-HCT) 320-12.5 MG tablet Take 1 tablet by mouth once daily 90 tablet 0   glucose blood (ONETOUCH VERIO) test strip 1 each by Other route as needed for other. Use as instructed 100 each 2   No current facility-administered medications on file prior to visit.    Review of Systems As in subjective    Objective:   Physical Exam Due to coronavirus pandemic stay at home measures, patient visit was virtual and they were not examined in person.   Wt 207 lb (93.9 kg)   BMI 30.57 kg/m   Gen: wd, wn, nad Coughing some No labored breathing or wheezing      Assessment:     Encounter Diagnosis  Name Primary?   Acute cough Yes       Plan:     We discussed the limitations of virtual consult on this Friday morning.   We discussed his cough that just started last night without a whole lot of other symptoms.  So it is too early to predict how this is going to go.  He does not have a lot of other symptoms yet.  I offered COVID testing and pulse oximeter test here at our office.  He declines for now.  Advise rest, hydration, continue the Mucinex DM.  If you need something stronger for cough at night to help with  cough interrupting sleep can use the Hycodan as below, caution with sedation though.  Advised if much worse over the weekend such as high fever, wheezing or shortness of breath, much worse symptoms then get reevaluated  If not much worse or better by Monday then call back with update on symptoms  Taimur was seen today for cough.  Diagnoses and all orders for this visit:  Acute cough  Other orders -     HYDROcodone bit-homatropine (HYCODAN) 5-1.5 MG/5ML syrup; Take 5 mLs by mouth every 8 (eight) hours as needed for up to 5 days for cough.    F/u prn

## 2023-08-08 ENCOUNTER — Emergency Department (HOSPITAL_COMMUNITY)
Admission: EM | Admit: 2023-08-08 | Discharge: 2023-08-08 | Disposition: A | Discharge status: Home / Self Care | Payer: Medicare Other | Attending: Emergency Medicine | Admitting: Emergency Medicine

## 2023-08-08 ENCOUNTER — Telehealth: Payer: Self-pay | Admitting: Family Medicine

## 2023-08-08 ENCOUNTER — Ambulatory Visit
Admission: EM | Admit: 2023-08-08 | Discharge: 2023-08-08 | Disposition: A | Discharge status: Home / Self Care | Payer: Medicare Other | Attending: Physician Assistant | Admitting: Physician Assistant

## 2023-08-08 ENCOUNTER — Emergency Department (HOSPITAL_COMMUNITY): Payer: Medicare Other

## 2023-08-08 ENCOUNTER — Encounter (HOSPITAL_COMMUNITY): Payer: Self-pay | Admitting: Emergency Medicine

## 2023-08-08 ENCOUNTER — Encounter: Payer: Self-pay | Admitting: Family Medicine

## 2023-08-08 ENCOUNTER — Other Ambulatory Visit: Payer: Self-pay

## 2023-08-08 DIAGNOSIS — Z7901 Long term (current) use of anticoagulants: Secondary | ICD-10-CM | POA: Insufficient documentation

## 2023-08-08 DIAGNOSIS — J45909 Unspecified asthma, uncomplicated: Secondary | ICD-10-CM | POA: Insufficient documentation

## 2023-08-08 DIAGNOSIS — R0682 Tachypnea, not elsewhere classified: Secondary | ICD-10-CM

## 2023-08-08 DIAGNOSIS — R059 Cough, unspecified: Secondary | ICD-10-CM | POA: Diagnosis present

## 2023-08-08 DIAGNOSIS — I2510 Atherosclerotic heart disease of native coronary artery without angina pectoris: Secondary | ICD-10-CM | POA: Diagnosis not present

## 2023-08-08 DIAGNOSIS — Z79899 Other long term (current) drug therapy: Secondary | ICD-10-CM | POA: Insufficient documentation

## 2023-08-08 DIAGNOSIS — I5030 Unspecified diastolic (congestive) heart failure: Secondary | ICD-10-CM | POA: Insufficient documentation

## 2023-08-08 DIAGNOSIS — E119 Type 2 diabetes mellitus without complications: Secondary | ICD-10-CM | POA: Diagnosis not present

## 2023-08-08 DIAGNOSIS — J189 Pneumonia, unspecified organism: Secondary | ICD-10-CM | POA: Diagnosis not present

## 2023-08-08 DIAGNOSIS — Z951 Presence of aortocoronary bypass graft: Secondary | ICD-10-CM | POA: Diagnosis not present

## 2023-08-08 LAB — URINALYSIS, ROUTINE W REFLEX MICROSCOPIC
Bilirubin Urine: NEGATIVE
Glucose, UA: NEGATIVE mg/dL
Ketones, ur: NEGATIVE mg/dL
Leukocytes,Ua: NEGATIVE
Nitrite: NEGATIVE
Protein, ur: NEGATIVE mg/dL
RBC / HPF: >50 RBC/hpf (ref 0–5)
Specific Gravity, Urine: 1.014 (ref 1.005–1.030)
pH: 5 (ref 5.0–8.0)

## 2023-08-08 LAB — COMPREHENSIVE METABOLIC PANEL
ALT: 21 U/L (ref 0–44)
AST: 26 U/L (ref 15–41)
Albumin: 3.7 g/dL (ref 3.5–5.0)
Alkaline Phosphatase: 38 U/L (ref 38–126)
Anion gap: 12 (ref 5–15)
BUN: 26 mg/dL — ABNORMAL HIGH (ref 8–23)
CO2: 20 mmol/L — ABNORMAL LOW (ref 22–32)
Calcium: 9.7 mg/dL (ref 8.9–10.3)
Chloride: 108 mmol/L (ref 98–111)
Creatinine, Ser: 1.45 mg/dL — ABNORMAL HIGH (ref 0.61–1.24)
GFR, Estimated: 47 mL/min — ABNORMAL LOW (ref >60)
Glucose, Bld: 125 mg/dL — ABNORMAL HIGH (ref 70–99)
Potassium: 3.9 mmol/L (ref 3.5–5.1)
Sodium: 140 mmol/L (ref 135–145)
Total Bilirubin: 1.4 mg/dL — ABNORMAL HIGH (ref <1.2)
Total Protein: 7.4 g/dL (ref 6.5–8.1)

## 2023-08-08 LAB — CBC WITH DIFFERENTIAL/PLATELET
Abs Immature Granulocytes: 0.02 10*3/uL (ref 0.00–0.07)
Basophils Absolute: 0 10*3/uL (ref 0.0–0.1)
Basophils Relative: 0 %
Eosinophils Absolute: 0.1 10*3/uL (ref 0.0–0.5)
Eosinophils Relative: 1 %
HCT: 33 % — ABNORMAL LOW (ref 39.0–52.0)
Hemoglobin: 10.9 g/dL — ABNORMAL LOW (ref 13.0–17.0)
Immature Granulocytes: 0 %
Lymphocytes Relative: 17 %
Lymphs Abs: 1.2 10*3/uL (ref 0.7–4.0)
MCH: 30.8 pg (ref 26.0–34.0)
MCHC: 33 g/dL (ref 30.0–36.0)
MCV: 93.2 fL (ref 80.0–100.0)
Monocytes Absolute: 0.7 10*3/uL (ref 0.1–1.0)
Monocytes Relative: 9 %
Neutro Abs: 5.4 10*3/uL (ref 1.7–7.7)
Neutrophils Relative %: 73 %
Platelets: 196 10*3/uL (ref 150–400)
RBC: 3.54 MIL/uL — ABNORMAL LOW (ref 4.22–5.81)
RDW: 16.3 % — ABNORMAL HIGH (ref 11.5–15.5)
WBC: 7.4 10*3/uL (ref 4.0–10.5)
nRBC: 0 % (ref 0.0–0.2)

## 2023-08-08 LAB — POC COVID19/FLU A&B COMBO
Covid Antigen, POC: NEGATIVE
Influenza A Antigen, POC: NEGATIVE
Influenza B Antigen, POC: NEGATIVE

## 2023-08-08 LAB — BRAIN NATRIURETIC PEPTIDE: B Natriuretic Peptide: 174.4 pg/mL — ABNORMAL HIGH (ref 0.0–100.0)

## 2023-08-08 MED ORDER — IPRATROPIUM-ALBUTEROL 0.5-2.5 (3) MG/3ML IN SOLN
3.0000 mL | Freq: Once | RESPIRATORY_TRACT | Status: AC
  Administered 2023-08-08: 3 mL via RESPIRATORY_TRACT
  Filled 2023-08-08: qty 3

## 2023-08-08 MED ORDER — DEXTROSE 5 % IV SOLN
500.0000 mg | Freq: Once | INTRAVENOUS | Status: AC
  Administered 2023-08-08: 500 mg via INTRAVENOUS
  Filled 2023-08-08: qty 5

## 2023-08-08 MED ORDER — DOXYCYCLINE HYCLATE 100 MG PO CAPS
100.0000 mg | ORAL_CAPSULE | Freq: Two times a day (BID) | ORAL | 0 refills | Status: AC
Start: 2023-08-08 — End: 2023-08-13

## 2023-08-08 MED ORDER — SODIUM CHLORIDE 0.9 % IV SOLN
1.0000 g | Freq: Once | INTRAVENOUS | Status: AC
  Administered 2023-08-08: 1 g via INTRAVENOUS
  Filled 2023-08-08: qty 10

## 2023-08-08 MED ORDER — METHYLPREDNISOLONE SODIUM SUCC 125 MG IJ SOLR
125.0000 mg | Freq: Once | INTRAMUSCULAR | Status: AC
  Administered 2023-08-08: 125 mg via INTRAVENOUS
  Filled 2023-08-08: qty 2

## 2023-08-08 NOTE — ED Triage Notes (Signed)
Per Spouse. "Since Thursday he has had a cough with congestion and a lot of mucous, he ran a fever Friday night up to 102". Note: He also has an aneurysm "so watches his health closely". Some sob right now.

## 2023-08-08 NOTE — ED Provider Notes (Signed)
Patient here today for evaluation of cough, congestion, tachypnea.  He has not been able to lie down due to cough and shortness of breath.  Flu and COVID screening negative in office.  Recommended further evaluation in the emergency room given past medical history.  Wife to transport via POV.   Tomi Bamberger, PA-C 08/08/23 [phone]

## 2023-08-08 NOTE — ED Provider Notes (Signed)
[place] EMERGENCY DEPARTMENT AT [facility] Provider Note   CSN: [id number] Arrival date & time: 08/08/23  1542     History  Chief Complaint  Patient presents with   Cough    [name] is a 87 y.o. male with past medical history significant for diastolic CHF, asthma, GERD, CAD, STEMI, CABG, diabetes presents to the ED complaining of productive cough, dyspnea on exertion, and pedal edema over the past 3 to 4 days.  Patient was evaluated earlier at urgent care and sent to the ED for further workup.  He did test negative for COVID, influenza, and RSV.  He reports that the sputum is green and brown.  He did have a fever on Friday with a temp of 102F.  He has been taking Mucinex DM every 12 hours and his primary care doctor called in a prescription cough medicine for him.  Denies chest pain, light-headedness, syncope, weakness.  He is compliant with his 20 mg Lasix.        Home Medications Prior to Admission medications   Medication Sig Start Date End Date Taking? Authorizing Provider  doxycycline (VIBRAMYCIN) 100 MG capsule Take 1 capsule (100 mg total) by mouth 2 (two) times daily for 5 days. 08/08/23 08/13/23 Yes Elouise Divelbiss R, PA-C  acetaminophen (TYLENOL) 500 MG tablet Take 1,000 mg by mouth every 6 (six) hours as needed for mild pain, moderate pain or headache. Reported on 03/16/2016    [provider]  amLODipine (NORVASC) 5 MG tablet Take 1 tablet by mouth once daily 09/22/22   Marykay Lex, MD  apixaban (ELIQUIS) 2.5 MG TABS tablet Take 1 tablet (2.5 mg total) by mouth 2 (two) times daily. 03/30/23   [facility], Roe Rutherford, PA  Choline Fenofibrate (FENOFIBRIC ACID) 135 MG CPDR Take 1 tablet by mouth daily. 03/30/23   [facility], Roe Rutherford, PA  ezetimibe (ZETIA) 10 MG tablet Take 1 tablet by mouth once daily 03/01/23   Swinyer, Zachary George, NP  Ferrous Sulfate (IRON PO) Take 65 mg by mouth daily.    [provider]  furosemide (LASIX) 20 MG tablet  Take 1 tablet (20 mg total) by mouth daily as needed. 02/15/22   Marykay Lex, MD  glucose blood [facility] VERIO) test strip 1 each by Other route as needed for other. Use as instructed 06/01/22   Ronnald Nian, MD  HYDROcodone bit-homatropine (HYCODAN) 5-1.5 MG/5ML syrup Take 5 mLs by mouth every 8 (eight) hours as needed for up to 5 days for cough. 08/05/23 08/10/23  Tysinger, Kermit Balo, PA-C  Multiple Vitamins-Minerals (MULTIVITAMIN WITH MINERALS) tablet Take 1 tablet by mouth daily.    [provider]  pantoprazole (PROTONIX) 40 MG tablet Take 1 tablet by mouth once daily 07/06/23   Marykay Lex, MD  ranolazine (RANEXA) 500 MG 12 hr tablet Take 1 tablet by mouth twice daily 11/15/22   Marykay Lex, MD  sildenafil (VIAGRA) 100 MG tablet TAKE ONE TABLET BY MOUTH DAILY AS NEEDED FOR ERECTILE DYSFUNCTION 10/27/21   Ronnald Nian, MD  valsartan-hydrochlorothiazide (DIOVAN-HCT) 320-12.5 MG tablet Take 1 tablet by mouth once daily 06/02/23   Ronnald Nian, MD      Allergies    Oxycodone, Lisinopril, Statins, and Welchol [colesevelam hcl]    Review of Systems   Review of Systems  Constitutional:  Positive for fever.  Respiratory:  Positive for cough and shortness of breath.   Cardiovascular:  Positive for leg swelling. Negative for  chest pain.  Neurological:  Negative for syncope, weakness and light-headedness.    Physical Exam Updated Vital Signs BP (!) 159/70   Pulse 89   Temp 99.7 F (37.6 C) (Oral)   Resp 18   SpO2 100%  Physical Exam Vitals and nursing note reviewed.  Constitutional:      General: He is not in acute distress.    Appearance: Normal appearance. He is not ill-appearing or diaphoretic.  HENT:     Mouth/Throat:     Lips: Pink.     Mouth: Mucous membranes are moist.  Cardiovascular:     Rate and Rhythm: Normal rate and regular rhythm.  Pulmonary:     Effort: Pulmonary effort is normal. No tachypnea or respiratory distress.     Breath sounds:  Wheezing and rhonchi present. No decreased breath sounds.     Comments: Patient speaking in full sentences.  Skin:    General: Skin is warm and dry.     Capillary Refill: Capillary refill takes less than 2 seconds.  Neurological:     Mental Status: He is alert. Mental status is at baseline.  Psychiatric:        Mood and Affect: Mood normal.        Behavior: Behavior normal.     ED Results / Procedures / Treatments   Labs (all labs ordered are listed, but only abnormal results are displayed) Labs Reviewed  CBC WITH DIFFERENTIAL/PLATELET - Abnormal; Notable for the following components:      Result Value   RBC 3.54 (*)    Hemoglobin 10.9 (*)    HCT 33.0 (*)    RDW 16.3 (*)    All other components within normal limits  COMPREHENSIVE METABOLIC PANEL - Abnormal; Notable for the following components:   CO2 20 (*)    Glucose, Bld 125 (*)    BUN 26 (*)    Creatinine, Ser 1.45 (*)    Total Bilirubin 1.4 (*)    GFR, Estimated 47 (*)    All other components within normal limits  BRAIN NATRIURETIC PEPTIDE - Abnormal; Notable for the following components:   B Natriuretic Peptide 174.4 (*)    All other components within normal limits  URINALYSIS, ROUTINE W REFLEX MICROSCOPIC - Abnormal; Notable for the following components:   Hgb urine dipstick LARGE (*)    Bacteria, UA RARE (*)    All other components within normal limits    EKG None  Radiology DG Chest 2 View  Result Date: 08/08/2023 CLINICAL DATA:  Cough with green/brown mucus. Productive cough for 4-5 days. EXAM: CHEST - 2 VIEW COMPARISON:  Radiograph 08/19/2021 FINDINGS: Post median sternotomy. Stable heart size and mediastinal contours. Aortic atherosclerosis ill-defined right infrahilar opacity. No pulmonary edema. No pleural effusion or pneumothorax. Cervical and lumbar fusion hardware is partially included. IMPRESSION: Ill-defined right infrahilar opacity, suspicious for pneumonia. Recommend radiographic follow-up to  resolution after course of treatment. Electronically Signed   By: Narda Rutherford M.D.   On: 08/08/2023 18:57    Procedures Procedures    Medications Ordered in ED Medications  azithromycin (ZITHROMAX) 500 mg in dextrose 5 % 250 mL IVPB (500 mg Intravenous New Bag/Given 08/08/23 2240)  ipratropium-albuterol (DUONEB) 0.5-2.5 (3) MG/3ML nebulizer solution 3 mL (3 mLs Nebulization Given 08/08/23 2014)  methylPREDNISolone sodium succinate (SOLU-MEDROL) 125 mg/2 mL injection 125 mg (125 mg Intravenous Given 08/08/23 2014)  cefTRIAXone (ROCEPHIN) 1 g in sodium chloride 0.9 % 100 mL IVPB (1 g Intravenous New Bag/Given 08/08/23 2154)  ED Course/ Medical Decision Making/ A&P                                 Medical Decision Making Amount and/or Complexity of Data Reviewed Radiology: ordered.   This patient presents to the ED with chief complaint(s) of cough, dyspnea on exertion, pedal edema with pertinent past medical history of CHF, HTN, CAD.  The complaint involves an extensive differential diagnosis and also carries with it a high risk of complications and morbidity.    The differential diagnosis includes pneumonia, acute bronchitis, viral syndrome, CHF exacerbation   The initial plan is to obtain labs, chest x-ray  Additional history obtained: Records reviewed  urgent care evaluated patient earlier and sent him to ED for further workup.  He was negative for Covid and influenza.  Initial Assessment:   Exam significant for overall well-appearing patient who is not in acute respiratory distress.  Expiratory wheezing appreciated in all fields, worse on the right.  There is also rhonchi in the upper and lower lobes on the right.  Bilateral 2+ pitting pedal edema.  Heart rate is normal with regular rhythm.  SpO2 in the upper 90s on room air.  Patient is speaking in full sentences without difficulty.  Independent ECG/labs interpretation:  The following labs were independently interpreted:  CBC  without leukocytosis, there is chronic anemia.  Metabolic panel with elevated creatinine, also chronic for patient.  BNP mildly elevated at 174.  UA was obtained due to patient having urine sample at bedside that was brown in appearance.  There is a large amount of microscopic hemoglobin, very rare bacteria.  Independent visualization and interpretation of imaging: I independently visualized the following imaging with scope of interpretation limited to determining acute life threatening conditions related to emergency care: Chest x-ray, which revealed likely right sided pneumonia.  I agree with radiologist interpretation.  Treatment and Reassessment: Patient given DuoNeb breathing treatment and 125 mg of Solu-Medrol.  Will also give IV Rocephin and Zithromax for treatment of CAP.  Plan to check pulse oximetry while patient ambulates.  Patient maintained SPO2 of 98%, HR increased to 102-106 bpm, but returned to normal with rest.  He had no evidence of respiratory distress.    Disposition:   Will send patient home on 5 days of doxycycline to continue treatment of CAP.  Advised patient to contact his prescriber of Eliquis regarding the itching he is experiencing starting his medication.  There is also concern that he has hematuria which may be related to anticoagulation.  Recommended patient take Claritin or Zyrtec to help with itching and to not abruptly stop taking his blood thinner.   The patient has been appropriately medically screened and/or stabilized in the ED. I have low suspicion for any other emergent medical condition which would require further screening, evaluation or treatment in the ED or require inpatient management. At time of discharge the patient is hemodynamically stable and in no acute distress. I have discussed work-up results and diagnosis with patient and answered all questions. Patient is agreeable with discharge plan. We discussed strict return precautions for returning to the  emergency department and they verbalized understanding.             Final Clinical Impression(s) / ED Diagnoses Final diagnoses:  Community acquired pneumonia of right lung, unspecified part of lung    Rx / DC Orders ED Discharge Orders  Ordered    doxycycline (VIBRAMYCIN) 100 MG capsule  2 times daily        08/08/23 2300              Lenard Simmer, PA-C 08/08/23 2316    Gloris Manchester, MD 08/10/23 [phone]

## 2023-08-08 NOTE — ED Notes (Signed)
Pt ambulated in room/hall. Pulse stayed between 102-106 and O2 was 98%. Pt tolerated well with no signs of distress.

## 2023-08-08 NOTE — ED Notes (Signed)
Patient is being discharged from the Urgent Care and sent to the Emergency Department via Private Vehicle (Spouse) . Per R. Reggie Pile, patient is in need of higher level of care due to Wheezing, SOB & Pain (High Risk Patient). Patient is aware and verbalizes understanding of plan of care.  Vitals:   08/08/23 1431 08/08/23 1445  BP:    Pulse:  77  Resp: (!) 30 (!) 28  Temp:    SpO2:  96%

## 2023-08-08 NOTE — ED Triage Notes (Addendum)
Patient arrives ambulatory by POV c/o cough with green and brown mucus since Friday. Reports fever Friday night. Has been taking mucinex DM every 12 hours and PCP called in a cough medicine.   Patients spouse adds that he was seen at [facility] and tested negative for covid, flue and RSV. State they sent him here for further evaluation.

## 2023-08-08 NOTE — Telephone Encounter (Signed)
Pt wife called and states [place] cough has not changed and it gets worse when he lays down. She noticed he only had a low grade fever on Saturday. She is asking do you think he needs an in person appointment to be evaluated?

## 2023-08-08 NOTE — Discharge Instructions (Addendum)
Thank you for allowing [place] to be a part of your care today.  Your chest x-ray shows that you do have a right sided pneumonia.  I am sending you home on 5 days of doxycycline to take twice daily STARTING TOMORROW.  Take this medication as prescribed and for the entire duration even if your symptoms begin to improve.  Contact your provider who prescribes your Eliquis and let them know that you have had itching and blood in your urine since starting this medication.  Do not abruptly stop taking your blood thinner.  I recommend taking 10 mg of Zyrtec OR Claritin as needed to help with itching.  Take Tylenol as needed for fever, body aches, etc.  Return to the ED if you develop sudden worsening of your symptoms or if you have new concerns.

## 2023-08-08 NOTE — Telephone Encounter (Signed)
Left message for pt to call me back 

## 2023-08-08 NOTE — ED Provider Triage Note (Signed)
Emergency Medicine Provider Triage Evaluation Note  [name] , a 87 y.o. male  was evaluated in triage.  Pt complains of productive cough, shortness of breath with exertion, pedal edema over the past 3 to 4 days.  He reports that he takes 20 mg of Lasix as needed and thinks he is only taken 1 this week.  Review of Systems  Positive: Productive cough, fever (Tmax 102), pedal edema, shortness of breath with exertion Negative:   Physical Exam  BP (!) 156/79 (BP Location: Right Arm)   Pulse 77   Temp 98.2 F (36.8 C) (Oral)   Resp 16   SpO2 98%  Gen:   Awake, no distress   Resp:  Normal effort  MSK:   Moves extremities without difficulty  Other:    Medical Decision Making  Medically screening exam initiated at 4:41 PM.  Appropriate orders placed.  [name] was informed that the remainder of the evaluation will be completed by another provider, this initial triage assessment does not replace that evaluation, and the importance of remaining in the ED until their evaluation is complete.  Lab work and imaging ordered   Judithann Sheen, [place] 08/08/23 [phone]

## 2023-08-09 ENCOUNTER — Telehealth: Payer: Self-pay

## 2023-08-09 NOTE — Telephone Encounter (Signed)
Left message for pt to call me back 

## 2023-08-09 NOTE — Transitions of Care (Post Inpatient/ED Visit) (Signed)
08/09/2023  Name: [name] MRN: [id number] DOB: 09-17-1936  Today's TOC FU Call Status: Today's TOC FU Call Status:: Successful TOC FU Call Completed TOC FU Call Complete Date: 08/09/23 Patient's Name and Date of Birth confirmed.  Transition Care Management Follow-up Telephone Call Date of Discharge: 08/08/23 Discharge Facility: [facility]) Type of Discharge: Emergency Department Reason for ED Visit: Respiratory Respiratory Diagnosis: Pnuemonia How have you been since you were released from the hospital?: Better Any questions or concerns?: No  Items Reviewed: Did you receive and understand the discharge instructions provided?: Yes Medications obtained,verified, and reconciled?: Yes (Medications Reviewed) Any new allergies since your discharge?: No Dietary orders reviewed?: Yes Do you have support at home?: Yes People in Home: spouse  Medications Reviewed Today: Medications Reviewed Today     Reviewed by Karena Addison, LPN (Licensed Practical Nurse) on 08/09/23 at 1449  Med List Status: <None>   Medication Order Taking? Sig Documenting Provider Last Dose Status Informant  acetaminophen (TYLENOL) 500 MG tablet [id number] No Take 1,000 mg by mouth every 6 (six) hours as needed for mild pain, moderate pain or headache. Reported on 03/16/2016 [provider] Unknown Active Spouse/Significant Other           Med Note Alphonzo Dublin   Tue Dec 28, 2022  9:10 AM)    amLODipine (NORVASC) 5 MG tablet [id number] No Take 1 tablet by mouth once daily Marykay Lex, MD Unknown Active Spouse/Significant Other  apixaban (ELIQUIS) 2.5 MG TABS tablet [id number] No Take 1 tablet (2.5 mg total) by mouth 2 (two) times daily. Marcelino Duster, PA Unknown Active   Choline Fenofibrate (FENOFIBRIC ACID) 135 MG CPDR [id number] No Take 1 tablet by mouth daily. Marcelino Duster, PA Unknown Active   doxycycline (VIBRAMYCIN) 100 MG capsule [id number]  Take 1 capsule (100 mg  total) by mouth 2 (two) times daily for 5 days. Clark, Meghan R, PA-C  Active   ezetimibe (ZETIA) 10 MG tablet [id number] No Take 1 tablet by mouth once daily Swinyer, Zachary George, NP Unknown Active   Ferrous Sulfate (IRON PO) [id number] No Take 65 mg by mouth daily. [provider] Unknown Active Spouse/Significant Other  furosemide (LASIX) 20 MG tablet [id number] No Take 1 tablet (20 mg total) by mouth daily as needed. Marykay Lex, MD Unknown Active Spouse/Significant Other  glucose blood (ONETOUCH VERIO) test strip [id number] No 1 each by Other route as needed for other. Use as instructed Ronnald Nian, MD Unknown Active Spouse/Significant Other  HYDROcodone bit-homatropine (HYCODAN) 5-1.5 MG/5ML syrup [id number] No Take 5 mLs by mouth every 8 (eight) hours as needed for up to 5 days for cough. Jac Canavan, PA-C 08/08/2023 Active   Multiple Vitamins-Minerals (MULTIVITAMIN WITH MINERALS) tablet [id number] No Take 1 tablet by mouth daily. [provider] Unknown Active Spouse/Significant Other  pantoprazole (PROTONIX) 40 MG tablet [id number] No Take 1 tablet by mouth once daily Marykay Lex, MD Unknown Active   ranolazine (RANEXA) 500 MG 12 hr tablet [id number] No Take 1 tablet by mouth twice daily Marykay Lex, MD Unknown Active Spouse/Significant Other  sildenafil (VIAGRA) 100 MG tablet [id number] No TAKE ONE TABLET BY MOUTH DAILY AS NEEDED FOR ERECTILE DYSFUNCTION Ronnald Nian, MD Unknown Active Spouse/Significant Other  valsartan-hydrochlorothiazide (DIOVAN-HCT) 320-12.5 MG tablet [id number] No Take 1 tablet by mouth once daily Ronnald Nian, MD Unknown Active             Home Care and Equipment/Supplies:  Were Home Health Services Ordered?: NA Any new equipment or medical supplies ordered?: NA  Functional Questionnaire: Do you need assistance with bathing/showering or dressing?: No Do you need assistance with meal preparation?: No Do you need assistance  with eating?: No Do you have difficulty maintaining continence: No Do you need assistance with getting out of bed/getting out of a chair/moving?: No Do you have difficulty managing or taking your medications?: No  Follow up appointments reviewed: PCP Follow-up appointment confirmed?: No (no avail appts) MD Provider Line Number:[phone] Given: No Specialist Hospital Follow-up appointment confirmed?: NA Do you need transportation to your follow-up appointment?: No Do you understand care options if your condition(s) worsen?: Yes-patient verbalized understanding    SIGNATURE Karena Addison, LPN [facility] Nurse Health Advisor Direct Dial [phone]

## 2023-08-10 NOTE — Telephone Encounter (Signed)
Pt's wife was notified  

## 2023-08-10 NOTE — Telephone Encounter (Signed)
Pt states that his only symptoms is Cough and his phelgm greens mucous. He can't lay down due to coughing. No congestion, no SOB, no fever or chills

## 2023-08-10 NOTE — Telephone Encounter (Signed)
Left message for Sotero to call back

## 2023-08-24 ENCOUNTER — Ambulatory Visit (INDEPENDENT_AMBULATORY_CARE_PROVIDER_SITE_OTHER): Payer: Medicare Other | Admitting: Medical

## 2023-08-24 VITALS — BP 130/82 | HR 74 | Temp 97.0°F | Resp 16 | Wt 199.8 lb

## 2023-08-24 DIAGNOSIS — R062 Wheezing: Secondary | ICD-10-CM | POA: Diagnosis not present

## 2023-08-24 DIAGNOSIS — R052 Subacute cough: Secondary | ICD-10-CM | POA: Diagnosis not present

## 2023-08-24 LAB — CBC WITH DIFFERENTIAL/PLATELET
Basophils Absolute: 0 10*3/uL (ref 0.0–0.2)
Basos: 1 %
EOS (ABSOLUTE): 0.1 10*3/uL (ref 0.0–0.4)
Eos: 2 %
Hematocrit: 34.2 % — ABNORMAL LOW (ref 37.5–51.0)
Hemoglobin: 11.1 g/dL — ABNORMAL LOW (ref 13.0–17.7)
Immature Grans (Abs): 0 10*3/uL (ref 0.0–0.1)
Immature Granulocytes: 1 %
Lymphocytes Absolute: 1.1 10*3/uL (ref 0.7–3.1)
Lymphs: 26 %
MCH: 29.8 pg (ref 26.6–33.0)
MCHC: 32.5 g/dL (ref 31.5–35.7)
MCV: 92 fL (ref 79–97)
Monocytes Absolute: 0.5 10*3/uL (ref 0.1–0.9)
Monocytes: 11 %
Neutrophils Absolute: 2.6 10*3/uL (ref 1.4–7.0)
Neutrophils: 59 %
Platelets: 194 10*3/uL (ref 150–450)
RBC: 3.72 x10E6/uL — ABNORMAL LOW (ref 4.14–5.80)
RDW: 14.4 % (ref 11.6–15.4)
WBC: 4.4 10*3/uL (ref 3.4–10.8)

## 2023-08-24 MED ORDER — FLUTICASONE-SALMETEROL 100-50 MCG/ACT IN AEPB: 1.0000 | INHALATION_SPRAY | Freq: Two times a day (BID) | RESPIRATORY_TRACT | 0 refills | Status: AC

## 2023-08-24 MED ORDER — PREDNISONE 10 MG PO TABS: ORAL_TABLET | ORAL | 0 refills | Status: DC

## 2023-08-24 MED ORDER — ALBUTEROL SULFATE HFA 108 (90 BASE) MCG/ACT IN AERS: 2.0000 | INHALATION_SPRAY | Freq: Four times a day (QID) | RESPIRATORY_TRACT | 0 refills | Status: DC | PRN

## 2023-08-24 NOTE — Patient Instructions (Signed)
Please go to [facility] for your chest xray.   Their hours are 8am - 4:30 pm Monday - Friday.  Take your insurance card with you.  [facility] [phone]  [address] [place], [place] [address]   After 5:00pm you could go to one of the local hospitals in the emergency department entrance and ask for radiology department for x-ray there.   Begin albuterol rescue inhaler 1 to 2 puffs every 4-6 hours.  Do 2 puffs when you get it and then 2 puffs at bedtime tonight.  This is a short acting rescue inhaler or emergency inhaler for cough, wheezing, shortness of breath.  If insurance will cover the Advair inhaler, begin this 2 puffs twice a day for the next 2 weeks for long-acting maintenance.  This reduces inflammation in the lungs and is long-acting compared to albuterol.  This is to be used in conjunction with albuterol.  Make sure you rinse your mouth out with water after use with this inhaler.  Begin oral prednisone steroid to reduce inflammation as well  We will call tomorrow with lab results and hopefully x-ray results  You may need to go back on additional antibiotic depending on your blood test and x-ray  If any of your medicines are too expensive or not covered by insurance then let me know

## 2023-08-24 NOTE — Progress Notes (Signed)
Subjective:  [name] is a 87 y.o. male who presents for Chief Complaint  Patient presents with   Pneumonia    Pneumonia since 11/4. Was doing better but seems to have flared back up     Here for recheck on cough, accompanied by wife  We did an initial visit on 08/05/2023 when he had literally 1 day of symptoms.  By that weekend he went to the emergency department for much worse symptoms and was diagnosed with pneumonia.  He ended up being treated with doxycycline.  He was getting some better, never got completely back to normal but then in the last week or so just continues to have a lot of hacking cough.  Cough is all the time, keeping him up at night, making the whole house shake.  He cannot lie flat due to the cough.  He has some runny nose.  Otherwise no other symptoms.  Maybe some chills.  No recent fever, no body aches, no chills, no NVD, no SOB. Using nothing for symptoms currently. Was doing mucinex  No other aggravating or relieving factors.    No other c/o.  Past Medical History:  Diagnosis Date   Adenomatous colon polyp    Ankle edema    Chronic   Asthma    CAD S/P percutaneous coronary angioplasty 2004   a) '04: Staged Taxus DES PCI to RCA (2 Taxus 2.75 x 32 & 12) and Cx-OM (Taxus 3 x 20);;'07 - PCI to pRCA ISR- Cypher DES; c) 05/2014: pPCI pRCA stentISR (Promus DES 3 x 8) OM1 distal to stent (Promus DES 2.75 x 12 - 3.1); 11/'22: distal LCx stent edge 99% -> Onyx Frontier DES 2.5 x 15 - 2.8 (overlaps prior stent)   CAD, multiple vessel 1985   Most recent July 2018: Admitted for non-STEMI. Occluded LAD with patent LIMA (SP1 now occluded).  Patent stents in proximal and mid RCA as well as circumflex-OM 3. Known occlusion of SVG-OM. EF 45-50%   CHF (congestive heart failure) (HCC)    Cholelithiasis - with cholangitis & choledocholithiasis    status post ERCP with removal of calculi and biliary stent placement.   Chronic anemia    On iron supplement; history of positive  guaiac - negative colonoscopy in 1996.; Thought to be related to hemorrhoids; status post hemorrhoidectomy   Chronic back pain     multiple surgeries; C-spine and lumbar   Diabetes mellitus    no meds- pt states he is no longer DM   Diverticulitis of colon 1996   Diverticulosis    Dyslipidemia, goal LDL below 70    Erectile dysfunction    Exertional dyspnea    Chronic baseline SOB with ambulation   GERD (gastroesophageal reflux disease)    Grade II diastolic dysfunction    H/O: pneumonia 11/2012   Hemorrhoids    Hiatal hernia    History of: ST elevation myocardial infarction (STEMI) involving left circumflex coronary artery with complication 1985   PTCA-circumflex; PCI in 1991   Hypertension    Moderate aortic stenosis by prior echocardiogram 07/2015   Normal LV function - EF 55-60%.. Abnormal relaxation. Mild-moderate aortic stenosis (peak/mean gradient 18/10 mmH)   Osteoarthritis of both knees    And back; multiple back surgeries, right knee arthroplasty and left knee arthroscopic surgery x2   Pneumonia 2016   S/P AAA (abdominal aortic aneurysm) repair 08/30/2012   s/p EVAR   S/P CABG x 2 1997   LIMA-LAD, SVG-OM   Sleep apnea  pt. states he was told to return for f/u, to be fitted for Cpap, but pt. reports that he didn't follow up-no cpap used   Statin myopathy 09/30/2015   Current Outpatient Medications on File Prior to Visit  Medication Sig Dispense Refill   amLODipine (NORVASC) 5 MG tablet Take 1 tablet by mouth once daily 90 tablet 3   apixaban (ELIQUIS) 2.5 MG TABS tablet Take 1 tablet (2.5 mg total) by mouth 2 (two) times daily. 180 tablet 3   Choline Fenofibrate (FENOFIBRIC ACID) 135 MG CPDR Take 1 tablet by mouth daily. 90 capsule 3   ezetimibe (ZETIA) 10 MG tablet Take 1 tablet by mouth once daily 60 tablet 2   Ferrous Sulfate (IRON PO) Take 65 mg by mouth daily.     furosemide (LASIX) 20 MG tablet Take 1 tablet (20 mg total) by mouth daily as needed. 30 tablet 6    Multiple Vitamins-Minerals (MULTIVITAMIN WITH MINERALS) tablet Take 1 tablet by mouth daily.     pantoprazole (PROTONIX) 40 MG tablet Take 1 tablet by mouth once daily 90 tablet 4   ranolazine (RANEXA) 500 MG 12 hr tablet Take 1 tablet by mouth twice daily 90 tablet 3   sildenafil (VIAGRA) 100 MG tablet TAKE ONE TABLET BY MOUTH DAILY AS NEEDED FOR ERECTILE DYSFUNCTION 90 tablet 1   valsartan-hydrochlorothiazide (DIOVAN-HCT) 320-12.5 MG tablet Take 1 tablet by mouth once daily 90 tablet 0   acetaminophen (TYLENOL) 500 MG tablet Take 1,000 mg by mouth every 6 (six) hours as needed for mild pain, moderate pain or headache. Reported on 03/16/2016     glucose blood (ONETOUCH VERIO) test strip 1 each by Other route as needed for other. Use as instructed 100 each 2   No current facility-administered medications on file prior to visit.     The following portions of the patient's history were reviewed and updated as appropriate: allergies, current medications, past family history, past medical history, past social history, past surgical history and problem list.  ROS Otherwise as in subjective above    Objective: BP 130/82   Pulse 74   Temp (!) 97 F (36.1 C)   Resp 16   Wt 199 lb 12.8 oz (90.6 kg)   SpO2 98%   BMI 29.51 kg/m   General appearance: alert, no distress, well developed, well nourished HEENT: normocephalic, sclerae anicteric, conjunctiva pink and moist, TMs pearly, nares patent, mild clear discharge, no erythema, pharynx normal Oral cavity: MMM, no lesions Neck: supple, no lymphadenopathy, no thyromegaly, no masses Heart: RRR, normal S1, S2, no murmurs Lungs: Rhonchi and rattly throughout, no rales Pulses: 2+ radial pulses, 2+ pedal pulses, normal cap refill Ext: no edema No leg swelling or asymmetry   Assessment: Encounter Diagnoses  Name Primary?   Subacute cough Yes   Wheezing      Plan: We discussed exam findings, symptoms.  I reviewed his 11//24 hospital  emergency department notes including x-ray and labs.  I will send him for updated x-ray today as well as CBC repeat.  Begin inhalers as below.  Discussed proper use of medication.  Begin prednisone.  Discussed risk and benefits including elevated blood sugars unfortunately it short-term.  Continue good water intake.  Can use over-the-counter Mucinex DM or Robitussin DM for cough and mucous  Patient Instructions  Please go to [facility] for your chest xray.   Their hours are 8am - 4:30 pm Monday - Friday.  Take your insurance card with you.  [facility]  [phone]  [address] [place], [place] [address]   After 5:00pm you could go to one of the local hospitals in the emergency department entrance and ask for radiology department for x-ray there.   Begin albuterol rescue inhaler 1 to 2 puffs every 4-6 hours.  Do 2 puffs when you get it and then 2 puffs at bedtime tonight.  This is a short acting rescue inhaler or emergency inhaler for cough, wheezing, shortness of breath.  If insurance will cover the Advair inhaler, begin this 2 puffs twice a day for the next 2 weeks for long-acting maintenance.  This reduces inflammation in the lungs and is long-acting compared to albuterol.  This is to be used in conjunction with albuterol.  Make sure you rinse your mouth out with water after use with this inhaler.  Begin oral prednisone steroid to reduce inflammation as well  We will call tomorrow with lab results and hopefully x-ray results  You may need to go back on additional antibiotic depending on your blood test and x-ray  If any of your medicines are too expensive or not covered by insurance then let me know      Meko was seen today for pneumonia.  Diagnoses and all orders for this visit:  Subacute cough -     DG Chest 2 View; Future -     CBC with Differential/Platelet  Wheezing -     DG Chest 2 View; Future -     CBC with Differential/Platelet  Other  orders -     albuterol (VENTOLIN HFA) 108 (90 Base) MCG/ACT inhaler; Inhale 2 puffs into the lungs every 6 (six) hours as needed for wheezing or shortness of breath. -     fluticasone-salmeterol (ADVAIR) 100-50 MCG/ACT AEPB; Inhale 1 puff into the lungs 2 (two) times daily. -     predniSONE (DELTASONE) 10 MG tablet; 6 tablets all together day 1, 5 tablets day 2, 4 tablets day 3, 3 tablets day 4, 2 tablets day 5, 1 tablet day 6.    Follow up: pending CXR, lab

## 2023-08-25 ENCOUNTER — Ambulatory Visit
Admission: RE | Admit: 2023-08-25 | Discharge: 2023-08-25 | Disposition: A | Discharge status: Home / Self Care | Payer: Medicare Other | Source: Ambulatory Visit | Attending: Medical | Admitting: Medical

## 2023-08-25 DIAGNOSIS — R062 Wheezing: Secondary | ICD-10-CM

## 2023-08-25 DIAGNOSIS — R052 Subacute cough: Secondary | ICD-10-CM

## 2023-08-25 NOTE — Progress Notes (Signed)
Blood count is stable.  Go for chest x-ray  Make sure he was able to get both inhalers at the pharmacy

## 2023-08-25 NOTE — Progress Notes (Signed)
X-ray does not show any new pneumonia.  Your cough and lung sounds may just be residual inflammation from the recent pneumonia.  See if he saw improvement last night or this morning with the inhalers?  Was he able to get both inhalers?  Did he start the prednisone oral?

## 2023-10-10 ENCOUNTER — Other Ambulatory Visit: Payer: Self-pay | Admitting: Nurse Practitioner

## 2023-10-18 ENCOUNTER — Encounter: Payer: Self-pay | Admitting: Family Medicine

## 2023-10-18 ENCOUNTER — Ambulatory Visit: Payer: Medicare Other | Admitting: Family Medicine

## 2023-10-18 VITALS — BP 140/84 | HR 65 | Ht 69.0 in | Wt 206.6 lb

## 2023-10-18 DIAGNOSIS — N342 Other urethritis: Secondary | ICD-10-CM

## 2023-10-18 DIAGNOSIS — S81012A Laceration without foreign body, left knee, initial encounter: Secondary | ICD-10-CM | POA: Diagnosis not present

## 2023-10-18 DIAGNOSIS — Z9181 History of falling: Secondary | ICD-10-CM

## 2023-10-18 DIAGNOSIS — R319 Hematuria, unspecified: Secondary | ICD-10-CM

## 2023-10-18 LAB — POCT URINALYSIS DIP (PROADVANTAGE DEVICE)
Glucose, UA: NEGATIVE mg/dL
Ketones, POC UA: NEGATIVE mg/dL
Nitrite, UA: POSITIVE — AB
Protein Ur, POC: 100 mg/dL — AB
Specific Gravity, Urine: 1.02
Urobilinogen, Ur: 0.2
pH, UA: 6 (ref 5.0–8.0)

## 2023-10-18 MED ORDER — SULFAMETHOXAZOLE-TRIMETHOPRIM 800-160 MG PO TABS: 1.0000 | ORAL_TABLET | Freq: Two times a day (BID) | ORAL | 0 refills | Status: DC

## 2023-10-18 NOTE — Progress Notes (Signed)
   Subjective:    Patient ID: [name], male    DOB: 1936/01/12, 88 y.o.   MRN: [id number]  HPI He complains of a 3-week history of seeing blood in his urine is gotten worse in the last several days.  He has had ongoing issues with urgency and frequency that started before that.  No fever, chills, abdominal pain, discharge or dysuria. He also states that he had surgery on his back in April and did well after that for a period of time but now is having back pain and is apparently fallen on several occasions.  He does plan to follow-up with Dr. Joshua.  He did sustain an injury to his left knee and he has a bandage on it.   Review of Systems     Objective:    Physical Exam Urine dipstick was positive for blood and nitrites. Exam of the left knee does show abrasion type laceration that is superficial in nature with no evidence of infection over the patella.       Assessment & Plan:  Hematuria, unspecified type - Plan: POCT Urinalysis DIP (Proadvantage Device)  Infective urethritis - Plan: Urine Culture, sulfamethoxazole -trimethoprim  (BACTRIM  DS) 800-160 MG tablet  Laceration of left knee, initial encounter  Personal history of fall He is to return here in 2 weeks for recheck on his urine. He is to keep the wound on his knee clean and if trouble possible infection keep me informed. I encouraged him to call Dr. Joshua office to get an evaluation for his back pain and falling

## 2023-10-20 LAB — URINE CULTURE

## 2023-11-01 ENCOUNTER — Ambulatory Visit: Payer: Medicare Other | Admitting: Family Medicine

## 2023-11-01 ENCOUNTER — Encounter: Payer: Self-pay | Admitting: Family Medicine

## 2023-11-01 VITALS — BP 130/70 | HR 64 | Ht 69.0 in | Wt 200.0 lb

## 2023-11-01 DIAGNOSIS — L299 Pruritus, unspecified: Secondary | ICD-10-CM | POA: Diagnosis not present

## 2023-11-01 DIAGNOSIS — R2689 Other abnormalities of gait and mobility: Secondary | ICD-10-CM

## 2023-11-01 DIAGNOSIS — R319 Hematuria, unspecified: Secondary | ICD-10-CM | POA: Diagnosis not present

## 2023-11-01 DIAGNOSIS — R296 Repeated falls: Secondary | ICD-10-CM | POA: Diagnosis not present

## 2023-11-01 DIAGNOSIS — I4891 Unspecified atrial fibrillation: Secondary | ICD-10-CM

## 2023-11-01 LAB — POCT URINALYSIS DIP (PROADVANTAGE DEVICE)
Glucose, UA: NEGATIVE mg/dL
Ketones, POC UA: NEGATIVE mg/dL
Nitrite, UA: NEGATIVE
Protein Ur, POC: 30 mg/dL — AB
Specific Gravity, Urine: 1.02
Urobilinogen, Ur: 0.2
pH, UA: 6 (ref 5.0–8.0)

## 2023-11-01 MED ORDER — CLOPIDOGREL BISULFATE 75 MG PO TABS
75.0000 mg | ORAL_TABLET | Freq: Every day | ORAL | 1 refills | Status: DC
Start: 2023-11-01 — End: 2024-02-03

## 2023-11-01 NOTE — Progress Notes (Signed)
   Subjective:    Patient ID: [name], male    DOB: 1936-10-02, 88 y.o.   MRN: [id number]  HPI He is here for a recheck.  He was seen about 2 weeks ago for gross hematuria.  The hematuria started after he was placed on Eliquis.  Also he noted that when he started Eliquis he had excessive amount of itching and went from twice per day down to once per day which did decrease the itching but he also had balance issues and has fallen multiple times over the last couple of months.  He has not had any other evidence of bleeding specifically epistaxis, bleeding from the rectum but has noted easy bruisability especially to his forearms   Review of Systems     Objective:    Physical Exam Alert and in no distress..  No bruising noted on forearms.  Urine was grossly positive for blood.       Assessment & Plan:  Hematuria, unspecified type - Plan: POCT Urinalysis DIP (Proadvantage Device)  Multiple falls  Balance problem  Pruritus  Atrial fibrillation, unspecified type (HCC)  With the history of hematuria, multiple falls, balance issues, I think continuing him on Eliquis would not be wise .  I will switch him to Plavix and he has an appointment to see cardiology in March and they will discuss this further with the cardiologist at that time I will also refer to urology to ensure that there is nothing urologic causing the bleeding

## 2023-11-02 LAB — CBC WITH DIFFERENTIAL/PLATELET
Basophils Absolute: 0 10*3/uL (ref 0.0–0.2)
Basos: 1 %
EOS (ABSOLUTE): 0.1 10*3/uL (ref 0.0–0.4)
Eos: 1 %
Hematocrit: 33.3 % — ABNORMAL LOW (ref 37.5–51.0)
Hemoglobin: 10.9 g/dL — ABNORMAL LOW (ref 13.0–17.7)
Immature Grans (Abs): 0 10*3/uL (ref 0.0–0.1)
Immature Granulocytes: 0 %
Lymphocytes Absolute: 1.6 10*3/uL (ref 0.7–3.1)
Lymphs: 29 %
MCH: 31.3 pg (ref 26.6–33.0)
MCHC: 32.7 g/dL (ref 31.5–35.7)
MCV: 96 fL (ref 79–97)
Monocytes Absolute: 0.4 10*3/uL (ref 0.1–0.9)
Monocytes: 8 %
Neutrophils Absolute: 3.3 10*3/uL (ref 1.4–7.0)
Neutrophils: 61 %
Platelets: 187 10*3/uL (ref 150–450)
RBC: 3.48 x10E6/uL — ABNORMAL LOW (ref 4.14–5.80)
RDW: 14.5 % (ref 11.6–15.4)
WBC: 5.4 10*3/uL (ref 3.4–10.8)

## 2023-11-06 ENCOUNTER — Telehealth: Payer: Self-pay | Admitting: Medical

## 2023-11-06 ENCOUNTER — Other Ambulatory Visit: Payer: Self-pay | Admitting: Medical

## 2023-11-06 DIAGNOSIS — N342 Other urethritis: Secondary | ICD-10-CM

## 2023-11-06 MED ORDER — SULFAMETHOXAZOLE-TRIMETHOPRIM 800-160 MG PO TABS
1.0000 | ORAL_TABLET | Freq: Two times a day (BID) | ORAL | 0 refills | Status: DC
Start: 2023-11-06 — End: 2024-02-03

## 2023-11-06 NOTE — Telephone Encounter (Signed)
I called and spoke to wife for after-hours call on Sunday, 11/06/2023 at 11:26 AM  He just recently finished antibiotics in the past week or so for urinary tract infection but now symptoms a started back with blood in the urine and irritation and burning.  I went ahead and called out another 7 days of Bactrim antibiotic.  He is not currently on Eliquis per his last conversation with Dr. Susann Givens in the past 2 weeks given frank hematuria\  He does have a follow-up appointment with urology for initial consult for hematuria within the next 2 weeks.  Advised if he gets worse in the meantime particular illness symptoms such as fever, body aches, chills, nausea, vomiting along with the urine symptoms, then go to the emergency department

## 2023-11-07 ENCOUNTER — Telehealth: Payer: Self-pay | Admitting: Family Medicine

## 2023-11-07 ENCOUNTER — Other Ambulatory Visit: Payer: Self-pay | Admitting: Cardiology

## 2023-11-07 DIAGNOSIS — I110 Hypertensive heart disease with heart failure: Secondary | ICD-10-CM

## 2023-11-07 NOTE — Telephone Encounter (Signed)
Pt has an appt on Wednesday 2/5. They have not picked up medication yet as it was going to be ready at 4pm today. Pt is starting to bleed clot

## 2023-11-07 NOTE — Telephone Encounter (Signed)
Pt has an appt with Dr. Rutherford [facility]

## 2023-11-07 NOTE — Telephone Encounter (Signed)
Pt wife Nicole Cella states she spoke with you over the weekend and asks if you can please call her when available about [place]. She wouldn't let me send a message back. [phone]

## 2023-11-09 ENCOUNTER — Encounter: Payer: Self-pay | Admitting: Urology

## 2023-11-09 ENCOUNTER — Telehealth (HOSPITAL_BASED_OUTPATIENT_CLINIC_OR_DEPARTMENT_OTHER): Payer: Self-pay

## 2023-11-09 ENCOUNTER — Ambulatory Visit (INDEPENDENT_AMBULATORY_CARE_PROVIDER_SITE_OTHER): Payer: Medicare Other | Admitting: Urology

## 2023-11-09 VITALS — BP 136/69 | HR 67

## 2023-11-09 DIAGNOSIS — R310 Gross hematuria: Secondary | ICD-10-CM

## 2023-11-09 LAB — BLADDER SCAN AMB NON-IMAGING

## 2023-11-09 NOTE — Progress Notes (Signed)
 Assessment: 1. Gross hematuria      Plan: Today had a long discussion with the patient and his wife regarding his gross hematuria and recommendations for evaluation.  As noted he had a CT stone study a few months ago which showed an atrophic right kidney but otherwise negative from a GU standpoint.  Given his CKD I think we should try to avoid contrast at this time.  Will obtain a renal ultrasound and schedule cystoscopy ASAP.  Chief Complaint: BLOOD IN URINE  History of Present Illness:  [name] is a 88 y.o. male with a past medical of diastolic CHF, asthma, GERD, CAD, STEMI, CABG, prior smoker, diabetes  who is seen in consultation from Joyce Norleen BROCKS, MD for evaluation of gross hematuria.  This began about 1 month ago and is present most days.  Was on Eliquis  which was recently discontinued not only because of the hematuria but because of balance issues and frequent falls.  He is taking Plavix  currently. Patient also has significant CKD with his creatinines varying from 1.45-2.09 over the past 2 years. CT stone study 06/2023 showed an atrophic right kidney otherwise negative from a GU standpoint.  Patient could not void during visit today.  Bladder scan 289 mL   Past Medical History:  Past Medical History:  Diagnosis Date   Adenomatous colon polyp    Ankle edema    Chronic   Asthma    CAD S/P percutaneous coronary angioplasty 2004   a) '04: Staged Taxus DES PCI to RCA (2 Taxus 2.75 x 32 & 12) and Cx-OM (Taxus 3 x 20);;'07 - PCI to pRCA ISR- Cypher DES; c) 05/2014: pPCI pRCA stentISR (Promus DES 3 x 8) OM1 distal to stent (Promus DES 2.75 x 12 - 3.1); 11/'22: distal LCx stent edge 99% -> Onyx Frontier DES 2.5 x 15 - 2.8 (overlaps prior stent)   CAD, multiple vessel 1985   Most recent July 2018: Admitted for non-STEMI. Occluded LAD with patent LIMA (SP1 now occluded).  Patent stents in proximal and mid RCA as well as circumflex-OM 3. Known occlusion of SVG-OM. EF 45-50%    CHF (congestive heart failure) (HCC)    Cholelithiasis - with cholangitis & choledocholithiasis    status post ERCP with removal of calculi and biliary stent placement.   Chronic anemia    On iron supplement; history of positive guaiac - negative colonoscopy in 1996.; Thought to be related to hemorrhoids; status post hemorrhoidectomy   Chronic back pain     multiple surgeries; C-spine and lumbar   Diabetes mellitus    no meds- pt states he is no longer DM   Diverticulitis of colon 1996   Diverticulosis    Dyslipidemia, goal LDL below 70    Erectile dysfunction    Exertional dyspnea    Chronic baseline SOB with ambulation   GERD (gastroesophageal reflux disease)    Grade II diastolic dysfunction    H/O: pneumonia 11/2012   Hemorrhoids    Hiatal hernia    History of: ST elevation myocardial infarction (STEMI) involving left circumflex coronary artery with complication 1985   PTCA-circumflex; PCI in 1991   Hypertension    Moderate aortic stenosis by prior echocardiogram 07/2015   Normal LV function - EF 55-60%.. Abnormal relaxation. Mild-moderate aortic stenosis (peak/mean gradient 18/10 mmH)   Osteoarthritis of both knees    And back; multiple back surgeries, right knee arthroplasty and left knee arthroscopic surgery x2   Pneumonia 2016   S/P  AAA (abdominal aortic aneurysm) repair 08/30/2012   s/p EVAR   S/P CABG x 2 1997   LIMA-LAD, SVG-OM   Sleep apnea    pt. states he was told to return for f/u, to be fitted for Cpap, but pt. reports that he didn't follow up-no cpap used   Statin myopathy 09/30/2015    Past Surgical History:  Past Surgical History:  Procedure Laterality Date   Abdominal and Lower Extremity Arterial Ultrasound  08/23/2012; 10/10/2013   Normal ABIs. Nonocclusive lower extremity disease. 4.2 cm x 4.3 cm infrarenal AAA;; 4.4 cm x 4.3 cm (essentially stable)    ABDOMINAL AORTIC ENDOVASCULAR STENT GRAFT N/A 08/13/2015   Procedure: ABDOMINAL AORTIC ENDOVASCULAR  STENT GRAFT;  Surgeon: Lynwood JONETTA Collum, MD;  Location: [facility] OR;  Service: Vascular;  Laterality: N/A;   Anterior cervical plating  04/23/2010   At C4-5 and a C6-7 utilizing two separate Biomet MaxAn plates.   ANTERIOR LAT LUMBAR FUSION Left 11/22/2012   Procedure: ANTERIOR LATERAL LUMBAR FUSION 1 LEVEL;  Surgeon: Alm GORMAN Molt, MD;  Location: [facility] NEURO ORS;  Service: Neurosurgery;  Laterality: Left;  Anterior Lateral Lumbar Fusion Lumbar Three-Four   BACK SURGERY  1979 & x 10   pt. remarks, I have had about 10 back surgeries   BILIARY STENT PLACEMENT N/A 07/03/2014   Procedure: BILIARY STENT PLACEMENT;  Surgeon: Lamar JONETTA Aho, MD;  Location: [facility] ENDOSCOPY;  Service: Endoscopy;  Laterality: N/A;   CARPAL TUNNEL RELEASE Left 11/16/2019   Procedure: LEFT CARPAL TUNNEL RELEASE;  Surgeon: Vernetta Lonni GRADE, MD;  Location: [facility] ORS;  Service: Orthopedics;  Laterality: Left;   CARPAL TUNNEL RELEASE Right 01/10/2023   Procedure: RIGHT CARPAL TUNNEL RELEASE;  Surgeon: Molt Alm GORMAN, MD;  Location: [facility] OR;  Service: Neurosurgery;  Laterality: Right;   CATARACT EXTRACTION     Cervical arthrodesis  04/23/2010   Anterior cervical arthrodesis, C4-5, C6-7 utilizing 7-mm PEEK interbody cage packed with local autograft & Antifuse putty at C4-5 & an 8-mm cage at C6-7.   CERVICAL DISCECTOMY  04/23/2010   Decompressive anterior carvical diskectomy. C4-5, C6-7   CHOLECYSTECTOMY N/A 05/23/2015   Procedure: LAPAROSCOPIC CHOLECYSTECTOMY WITH INTRAOPERATIVE CHOLANGIOGRAM;  Surgeon: Alm Angle, MD;  Location: [facility] ORS;  Service: General;  Laterality: N/A;   COLONOSCOPY  1996   CORONARY ANGIOPLASTY  1985,1991,15   1985 lateral STEMI Circumflex PTCA;    CORONARY ARTERY BYPASS GRAFT  1997   LIMA-LAD, SVG-OM (SVG known to be occluded prior to 2004)   CORONARY STENT INTERVENTION N/A 08/20/2021   Procedure: CORONARY STENT INTERVENTION;  Surgeon: Court Dorn PARAS, MD;  Location: [facility] INVASIVE CV LAB;  Service:  Cardiovascular:  dLCx distal Edge 99% => Onyx Frontier DES 2.5 x 15 (2.8 mm) overlaps prior stent distally.   ERCP N/A 07/03/2014   Procedure: ENDOSCOPIC RETROGRADE CHOLANGIOPANCREATOGRAPHY (ERCP);  Surgeon: Lamar JONETTA Aho, MD;  Location: [facility] ENDOSCOPY;  Service: Endoscopy;  Laterality: N/A;   ERCP N/A 07/05/2014   Procedure: ENDOSCOPIC RETROGRADE CHOLANGIOPANCREATOGRAPHY (ERCP);  Surgeon: Lamar JONETTA Aho, MD;  Location: [facility] ENDOSCOPY;  Service: Endoscopy;  Laterality: N/A;   ERCP N/A 06/30/2015   Procedure: ENDOSCOPIC RETROGRADE CHOLANGIOPANCREATOGRAPHY (ERCP);  Surgeon: Gwendlyn ONEIDA Buddy, MD;  Location: [facility];  Service: Endoscopy;  Laterality: N/A;   EYE SURGERY     IR ANGIOGRAM EXTREMITY LEFT  01/18/2017   IR ANGIOGRAM PELVIS SELECTIVE OR SUPRASELECTIVE  01/18/2017   IR ANGIOGRAM SELECTIVE EACH ADDITIONAL VESSEL  01/18/2017   IR AORTAGRAM ABDOMINAL SERIALOGRAM  01/18/2017   IR EMBO ARTERIAL NOT HEMORR HEMANG INC GUIDE ROADMAPPING  01/18/2017   IR RADIOLOGIST EVAL & MGMT  01/04/2017   IR RADIOLOGIST EVAL & MGMT  10/26/2017   IR RADIOLOGIST EVAL & MGMT  05/24/2018   IR RADIOLOGIST EVAL & MGMT  06/19/2019   IR RADIOLOGIST EVAL & MGMT  06/11/2020   IR RADIOLOGIST EVAL & MGMT  07/02/2021   IR RADIOLOGIST EVAL & MGMT  06/22/2022   IR RADIOLOGIST EVAL & MGMT  07/08/2023   IR US  GUIDE VASC ACCESS RIGHT  01/18/2017   KNEE ARTHROSCOPY Left    x 2   LEFT HEART CATH AND CORS/GRAFTS ANGIOGRAPHY  06/2003   None Occluded vein graft to OM; diffuse RCA disease in the mid vessel, 80% circumflex-OM stenosis; follow on AV groove circumflex with sequential 90% stenoses and intervening saccular dilation    LEFT HEART CATH AND CORS/GRAFTS ANGIOGRAPHY  12/2008   4 abnormal Myoview  showing apical thinning (possibly due to apical LAD 95%) : 100% Occluded LAD after SV1, distal LAD grafted via LIMA -apical 95% . Cx -OM1 w/patent stent extending into OM 1 . Follow on Cx - 70-80% - non-amenable PCI. RCA widely  patent 3 overlapping stents in mRCA w/less than 40% stenosisin RPL; SVG-OM known occluded    LEFT HEART CATH AND CORS/GRAFTS ANGIOGRAPHY N/A 04/20/2017   Procedure: Left Heart Cath and Cors/Grafts Angiography;  Surgeon: Court Dorn PARAS, MD;  Location: [facility] INVASIVE CV LAB;  LAD now occluded prior to SP1.LIMA-LAD. Patent RCA and circumflex stents. EF 45-50%. Relatively stable.    LEFT HEART CATH AND CORS/GRAFTS ANGIOGRAPHY N/A 08/20/2021   Procedure: LEFT HEART CATH AND CORS/GRAFTS ANGIOGRAPHY;  Surgeon: Court Dorn PARAS, MD;  Location: [facility] INVASIVE CV LAB;  Service: Cardiovascular: CULPRIT 99% dLCx just after stent (Overlapping DES PCI) - otw patent LCx stent, patent RCA stent & Patent LIMA-LAD with CTO of prox LAD.   LEFT HEART CATHETERIZATION WITH CORONARY ANGIOGRAM N/A 05/15/2014   Procedure: LEFT HEART CATHETERIZATION WITH CORONARY ANGIOGRAM;  Surgeon: Victory LELON Claudene DOUGLAS, MD;  Location: [facility] CATH LAB;  Service: Cardiovascular;  Laterality: N/A;   LEFT HEART CATHETERIZATION WITH CORONARY/GRAFT ANGIOGRAM N/A 06/28/2014   Procedure: LEFT HEART CATHETERIZATION WITH EL BILE;  Surgeon: Debby DELENA Sor, MD;  Location: [facility] CATH LAB;  Service: Cardiovascular;  Laterality: N/A;   LUMBAR PERCUTANEOUS PEDICLE SCREW 1 LEVEL N/A 11/22/2012   Procedure: LUMBAR PERCUTANEOUS PEDICLE SCREW 1 LEVEL;  Surgeon: Alm GORMAN Molt, MD;  Location: [facility]URO ORS;  Service: Neurosurgery;  Laterality: N/A;  Lumbar Three-Four Percutaneous Pedicle Screw, Lateral approach   MINOR HEMORRHOIDECTOMY     NM MYOVIEW  LTD  05/26/2021   Lexiscan : Intermediate risk (similar findings to last study), with exception of reduced EF roughly 40%..  Prior basal inferior-inferolateral infarct without peri-infarct ischemia.   ->  Echo ordered -> (EF 45 to 50%)-when compared to prior Myoview , infarct appears similar.   PERCUTANEOUS CORONARY STENT INTERVENTION (PCI-S)  06/2003   PCI - RCA 2 overlapping Taxus DES 2.75 mm x 32 mm and 2.75 mm  x 12 mm (3.0 mm); PCI-Cx-OM1 - Taxus DES 3.0 mm x 20 mm (3.1 mm);    PERCUTANEOUS CORONARY STENT INTERVENTION (PCI-S)  12/2005   80% ISR in proximal Taxus stent in RCA -- covered proximally with Cypher DES 3.0 mm x 12 mm   PERCUTANEOUS CORONARY STENT INTERVENTION (PCI-S) N/A 05/17/2014   Procedure: PERCUTANEOUS CORONARY STENT INTERVENTION (PCI-S);  Surgeon: Victory LELON Claudene DOUGLAS, MD;  Location: [facility] CATH LAB: PCI pRCA stent ISR - Promus DES 3.0 x 8 (3.25), OM distal stent edge - Promus DES 2.75 x 12 (3.1)   PERIPHERAL VASCULAR CATHETERIZATION  11/03/2016   Procedure: Embolization;  Surgeon: Penne Lonni Colorado, MD;  Location: [facility] INVASIVE CV LAB;  Service: Cardiovascular;;   POSTERIOR CERVICAL FUSION/FORAMINOTOMY N/A 05/02/2013   Procedure: POSTERIOR CERVICAL FUSION/FORAMINOTOMY CERVICAL SEVEN THORACIC-ONE;  Surgeon: Alm GORMAN Molt, MD;  Location: [facility] NEURO ORS;  Service: Neurosurgery;  Laterality: N/A;  POSTERIOR CERVICAL FUSION/FORAMINOTOMY CERVICAL SEVEN THORACIC-ONE   SPHINCTEROTOMY N/A 06/30/2015   Procedure: SPHINCTEROTOMY;  Surgeon: Gwendlyn ONEIDA Buddy, MD;  Location: [facility] ENDOSCOPY;  Service: Endoscopy;  Laterality: N/A;   TOTAL KNEE ARTHROPLASTY Right    TOTAL KNEE ARTHROPLASTY Left 12/20/2016   Procedure: LEFT TOTAL KNEE ARTHROPLASTY;  Surgeon: Marcey Raman, MD;  Location: [facility] OR;  Service: Orthopedics;  Laterality: Left;   TRANSTHORACIC ECHOCARDIOGRAM  06/10/2021   EF 45 to 50%.  Mildly reduced function.  Mild hypokinesis of basal-mid inferior and inferolateral wall consistent with prior infarct.  Severe LA dilation.  Mild to moderate MR with moderate MAC.  Mild calcific AS (mean gradient 11 mmHg) --> LV function seems unchanged.  Diastolic function improved.  Wall motion abnormality unchanged.  AS unchanged.   TRANSTHORACIC ECHOCARDIOGRAM  07/2018   Normal LV size.  EF 50 to 55% with mild HK of inferolateral wall.  GRII DD.  Mild AS with mean gradient 15 mm or greater.  Mild ascending aortic  dilation.  Mildly increased PA pressures of 41 mmHg   UPPER GI ENDOSCOPY  07/03/2019   VISCERAL ANGIOGRAM  11/03/2016   Procedure: Visceral Angiogram;  Surgeon: Penne Lonni Colorado, MD;  Location: [facility] INVASIVE CV LAB;  Service: Cardiovascular;;    Allergies:  Allergies  Allergen Reactions   Oxycodone  Shortness Of Breath and Cough   Lisinopril Cough   Statins Other (See Comments)    MYALGIAS    Welchol [Colesevelam Hcl] Itching    Family History:  Family History  Problem Relation Age of Onset   Arthritis Mother    Diabetes Father    Heart disease Father    Stroke Sister    Hypertension Sister    Heart disease Sister    Diabetes Sister    Breast cancer Sister    Heart disease Brother    Ulcers Brother    Colon cancer Neg Hx     Social History:  Social History   Tobacco Use   Smoking status: Former    Current packs/day: 0.00    Average packs/day: 3.0 packs/day for 45.0 years (135.0 ttl pk-yrs)    Types: Cigarettes    Start date: 10/04/1938    Quit date: 10/05/1983    Years since quitting: 40.1   Smokeless tobacco: Never  Vaping Use   Vaping status: Never Used  Substance Use Topics   Alcohol use: Yes    Alcohol/week: 7.0 - 14.0 standard drinks of alcohol    Types: 7 - 14 Cans of beer per week    Comment: 1-2 beers per day   Drug use: No    Review of symptoms:  Constitutional:  Negative for unexplained weight loss, night sweats, fever, chills ENT:  Negative for nose bleeds, sinus pain, painful swallowing CV:  Negative for chest pain, shortness of breath, exercise intolerance, palpitations, loss of consciousness Resp:  Negative for cough, wheezing, shortness of breath GI:  Negative for nausea, vomiting, diarrhea, bloody stools GU:  Positives noted in HPI;  otherwise negative for gross hematuria, dysuria, urinary incontinence Neuro:  Negative for seizures, poor balance, limb weakness, slurred speech Psych:  Negative for lack of energy, depression,  anxiety Endocrine:  Negative for polydipsia, polyuria, symptoms of hypoglycemia (dizziness, hunger, sweating) Hematologic:  Negative for anemia, purpura, petechia, prolonged or excessive bleeding, use of anticoagulants  Allergic:  Negative for difficulty breathing or choking as a result of exposure to anything; no shellfish allergy; no allergic response (rash/itch) to materials, foods  Physical exam: BP 136/69   Pulse 67  GENERAL APPEARANCE:  Well appearing, well developed, well nourished, NAD   Results: Results for orders placed or performed in visit on 11/09/23 (from the past 24 hours)  BLADDER SCAN AMB NON-IMAGING   Collection Time: 11/09/23 12:00 AM  Result Value Ref Range   Scan Result 

## 2023-11-10 ENCOUNTER — Ambulatory Visit: Payer: Medicare Other | Admitting: Family Medicine

## 2023-11-14 ENCOUNTER — Ambulatory Visit (HOSPITAL_BASED_OUTPATIENT_CLINIC_OR_DEPARTMENT_OTHER)
Admission: RE | Admit: 2023-11-14 | Discharge: 2023-11-14 | Disposition: A | Discharge status: Home / Self Care | Payer: Medicare Other | Source: Ambulatory Visit | Attending: Urology | Admitting: Urology

## 2023-11-14 DIAGNOSIS — R310 Gross hematuria: Secondary | ICD-10-CM

## 2023-11-15 ENCOUNTER — Encounter: Payer: Self-pay | Admitting: Urology

## 2023-11-15 ENCOUNTER — Ambulatory Visit: Payer: Medicare Other | Admitting: Urology

## 2023-11-15 VITALS — BP 133/69 | HR 79 | Ht 69.0 in | Wt 206.0 lb

## 2023-11-15 DIAGNOSIS — R310 Gross hematuria: Secondary | ICD-10-CM | POA: Diagnosis not present

## 2023-11-15 DIAGNOSIS — N401 Enlarged prostate with lower urinary tract symptoms: Secondary | ICD-10-CM

## 2023-11-15 LAB — URINALYSIS, ROUTINE W REFLEX MICROSCOPIC
Bilirubin, UA: NEGATIVE
Glucose, UA: NEGATIVE
Ketones, UA: NEGATIVE
Leukocytes,UA: NEGATIVE
Nitrite, UA: NEGATIVE
Protein,UA: NEGATIVE
Specific Gravity, UA: 1.02 (ref 1.005–1.030)
Urobilinogen, Ur: 0.2 mg/dL (ref 0.2–1.0)
pH, UA: 5.5 (ref 5.0–7.5)

## 2023-11-15 LAB — MICROSCOPIC EXAMINATION

## 2023-11-15 MED ORDER — FINASTERIDE 5 MG PO TABS: 5.0000 mg | ORAL_TABLET | Freq: Every day | ORAL | 3 refills | Status: AC

## 2023-11-15 MED ORDER — CIPROFLOXACIN HCL 500 MG PO TABS
500.0000 mg | ORAL_TABLET | Freq: Once | ORAL | Status: AC
Start: 2023-11-15 — End: 2023-11-15
  Administered 2023-11-15: 500 mg via ORAL

## 2023-11-15 NOTE — Progress Notes (Signed)
Assessment: 1. Gross hematuria   2. Benign localized prostatic hyperplasia with lower urinary tract symptoms (LUTS)     Plan: Today following the cystoscopy I sat down with the patient and his wife and discussed the cystoscopic findings.  He does have a resolving clot within the bladder but no focal mucosal lesions.  He does have a large friable prostate with intravesical extension which statistically is the likely source of his bleeding when anticoagulated.  I have recommended that we begin finasteride which can help lessen the risk of bleeding moving forward.  Will try to avoid an alpha-blocker given his issues with unsteady gait and history of falling.  Voided cytology sent today-follow-up  Follow-up 3 months or sooner problems arise  Chief Complaint: Chief Complaint  Patient presents with   Cysto    HPI: [name] is a 88 y.o. male who presents for continued evaluation of gross hematuria.  Note is made that the patient held his Plavix the last 2 days and his urine has entirely cleared.  He does have some microscopic hematuria today. Patient is here today for cystoscopy.  Today I reviewed his recent renal ultrasound which showed a solid-appearing lesion in the left posterior bladder-on cystoscopy today this is a resolving clot-no tumor seen.  Likely source of bleeding is a large friable prostate.  See full cystoscopy report below   Portions of the above documentation were copied from a prior visit for review purposes only.  Allergies: Allergies  Allergen Reactions   Oxycodone Shortness Of Breath and Cough   Lisinopril Cough   Statins Other (See Comments)    MYALGIAS    Welchol [Colesevelam Hcl] Itching    PMH: Past Medical History:  Diagnosis Date   Adenomatous colon polyp    Ankle edema    Chronic   Asthma    CAD S/P percutaneous coronary angioplasty 2004   a) '04: Staged Taxus DES PCI to RCA (2 Taxus 2.75 x 32 & 12) and Cx-OM (Taxus 3 x 20);;'07 - PCI to  pRCA ISR- Cypher DES; c) 05/2014: pPCI pRCA stentISR (Promus DES 3 x 8) OM1 distal to stent (Promus DES 2.75 x 12 - 3.1); 11/'22: distal LCx stent edge 99% -> Onyx Frontier DES 2.5 x 15 - 2.8 (overlaps prior stent)   CAD, multiple vessel 1985   Most recent July 2018: Admitted for non-STEMI. Occluded LAD with patent LIMA (SP1 now occluded).  Patent stents in proximal and mid RCA as well as circumflex-OM 3. Known occlusion of SVG-OM. EF 45-50%   CHF (congestive heart failure) (HCC)    Cholelithiasis - with cholangitis & choledocholithiasis    status post ERCP with removal of calculi and biliary stent placement.   Chronic anemia    On iron supplement; history of positive guaiac - negative colonoscopy in 1996.; Thought to be related to hemorrhoids; status post hemorrhoidectomy   Chronic back pain     multiple surgeries; C-spine and lumbar   Diabetes mellitus    no meds- pt states he is no longer DM   Diverticulitis of colon 1996   Diverticulosis    Dyslipidemia, goal LDL below 70    Erectile dysfunction    Exertional dyspnea    Chronic baseline SOB with ambulation   GERD (gastroesophageal reflux disease)    Grade II diastolic dysfunction    H/O: pneumonia 11/2012   Hemorrhoids    Hiatal hernia    History of: ST elevation myocardial infarction (STEMI) involving left circumflex coronary artery  with complication 1985   PTCA-circumflex; PCI in 1991   Hypertension    Moderate aortic stenosis by prior echocardiogram 07/2015   Normal LV function - EF 55-60%.. Abnormal relaxation. Mild-moderate aortic stenosis (peak/mean gradient 18/10 mmH)   Osteoarthritis of both knees    And back; multiple back surgeries, right knee arthroplasty and left knee arthroscopic surgery x2   Pneumonia 2016   S/P AAA (abdominal aortic aneurysm) repair 08/30/2012   s/p EVAR   S/P CABG x 2 1997   LIMA-LAD, SVG-OM   Sleep apnea    pt. states he was told to return for f/u, to be fitted for Cpap, but pt. reports that he  didn't follow up-no cpap used   Statin myopathy 09/30/2015    PSH: Past Surgical History:  Procedure Laterality Date   Abdominal and Lower Extremity Arterial Ultrasound  08/23/2012; 10/10/2013   Normal ABIs. Nonocclusive lower extremity disease. 4.2 cm x 4.3 cm infrarenal AAA;; 4.4 cm x 4.3 cm (essentially stable)    ABDOMINAL AORTIC ENDOVASCULAR STENT GRAFT N/A 08/13/2015   Procedure: ABDOMINAL AORTIC ENDOVASCULAR STENT GRAFT;  Surgeon: Pryor Ochoa, MD;  Location: [facility] OR;  Service: Vascular;  Laterality: N/A;   Anterior cervical plating  04/23/2010   At C4-5 and a C6-7 utilizing two separate Biomet MaxAn plates.   ANTERIOR LAT LUMBAR FUSION Left 11/22/2012   Procedure: ANTERIOR LATERAL LUMBAR FUSION 1 LEVEL;  Surgeon: Tia Alert, MD;  Location: [facility] NEURO ORS;  Service: Neurosurgery;  Laterality: Left;  Anterior Lateral Lumbar Fusion Lumbar Three-Four   BACK SURGERY  1979 & x 10   pt. remarks, "I have had about 10 back surgeries"   BILIARY STENT PLACEMENT N/A 07/03/2014   Procedure: BILIARY STENT PLACEMENT;  Surgeon: Louis Meckel, MD;  Location: [facility] ENDOSCOPY;  Service: Endoscopy;  Laterality: N/A;   CARPAL TUNNEL RELEASE Left 11/16/2019   Procedure: LEFT CARPAL TUNNEL RELEASE;  Surgeon: Kathryne Hitch, MD;  Location: [facility] ORS;  Service: Orthopedics;  Laterality: Left;   CARPAL TUNNEL RELEASE Right 01/10/2023   Procedure: RIGHT CARPAL TUNNEL RELEASE;  Surgeon: Tia Alert, MD;  Location: [facility] OR;  Service: Neurosurgery;  Laterality: Right;   CATARACT EXTRACTION     Cervical arthrodesis  04/23/2010   Anterior cervical arthrodesis, C4-5, C6-7 utilizing 7-mm PEEK interbody cage packed with local autograft & Antifuse putty at C4-5 & an 8-mm cage at C6-7.   CERVICAL DISCECTOMY  04/23/2010   Decompressive anterior carvical diskectomy. C4-5, C6-7   CHOLECYSTECTOMY N/A 05/23/2015   Procedure: LAPAROSCOPIC CHOLECYSTECTOMY WITH INTRAOPERATIVE CHOLANGIOGRAM;  Surgeon: Ovidio Kin, MD;   Location: [facility] ORS;  Service: General;  Laterality: N/A;   COLONOSCOPY  1996   CORONARY ANGIOPLASTY  1985,1991,15   1985 lateral STEMI Circumflex PTCA;    CORONARY ARTERY BYPASS GRAFT  1997   LIMA-LAD, SVG-OM (SVG known to be occluded prior to 2004)   CORONARY STENT INTERVENTION N/A 08/20/2021   Procedure: CORONARY STENT INTERVENTION;  Surgeon: Runell Gess, MD;  Location: [facility] INVASIVE CV LAB;  Service: Cardiovascular:  dLCx distal Edge 99% => Onyx Frontier DES 2.5 x 15 (2.8 mm) overlaps prior stent distally.   ERCP N/A 07/03/2014   Procedure: ENDOSCOPIC RETROGRADE CHOLANGIOPANCREATOGRAPHY (ERCP);  Surgeon: Louis Meckel, MD;  Location: [facility] ENDOSCOPY;  Service: Endoscopy;  Laterality: N/A;   ERCP N/A 07/05/2014   Procedure: ENDOSCOPIC RETROGRADE CHOLANGIOPANCREATOGRAPHY (ERCP);  Surgeon: Louis Meckel, MD;  Location: [facility] ENDOSCOPY;  Service: Endoscopy;  Laterality: N/A;  ERCP N/A 06/30/2015   Procedure: ENDOSCOPIC RETROGRADE CHOLANGIOPANCREATOGRAPHY (ERCP);  Surgeon: Meryl Dare, MD;  Location: [facility] ENDOSCOPY;  Service: Endoscopy;  Laterality: N/A;   EYE SURGERY     IR ANGIOGRAM EXTREMITY LEFT  01/18/2017   IR ANGIOGRAM PELVIS SELECTIVE OR SUPRASELECTIVE  01/18/2017   IR ANGIOGRAM SELECTIVE EACH ADDITIONAL VESSEL  01/18/2017   IR AORTAGRAM ABDOMINAL SERIALOGRAM  01/18/2017   IR EMBO ARTERIAL NOT HEMORR HEMANG INC GUIDE ROADMAPPING  01/18/2017   IR RADIOLOGIST EVAL & MGMT  01/04/2017   IR RADIOLOGIST EVAL & MGMT  10/26/2017   IR RADIOLOGIST EVAL & MGMT  05/24/2018   IR RADIOLOGIST EVAL & MGMT  06/19/2019   IR RADIOLOGIST EVAL & MGMT  06/11/2020   IR RADIOLOGIST EVAL & MGMT  07/02/2021   IR RADIOLOGIST EVAL & MGMT  06/22/2022   IR RADIOLOGIST EVAL & MGMT  07/08/2023   IR US GUIDE VASC ACCESS RIGHT  01/18/2017   KNEE ARTHROSCOPY Left    x 2   LEFT HEART CATH AND CORS/GRAFTS ANGIOGRAPHY  06/2003   None Occluded vein graft to OM; diffuse RCA disease in the mid vessel, 80%  circumflex-OM stenosis; follow on AV groove circumflex with sequential 90% stenoses and intervening saccular dilation    LEFT HEART CATH AND CORS/GRAFTS ANGIOGRAPHY  12/2008   4 abnormal Myoview showing apical thinning (possibly due to apical LAD 95%) : 100% Occluded LAD after SV1, distal LAD grafted via LIMA -apical 95% . Cx -OM1 w/patent stent extending into OM 1 . Follow on Cx - 70-80% - non-amenable PCI. RCA widely patent 3 overlapping stents in mRCA w/less than 40% stenosisin RPL; SVG-OM known occluded    LEFT HEART CATH AND CORS/GRAFTS ANGIOGRAPHY N/A 04/20/2017   Procedure: Left Heart Cath and Cors/Grafts Angiography;  Surgeon: Runell Gess, MD;  Location: [facility] INVASIVE CV LAB;  LAD now occluded prior to SP1.LIMA-LAD. Patent RCA and circumflex stents. EF 45-50%. Relatively stable.    LEFT HEART CATH AND CORS/GRAFTS ANGIOGRAPHY N/A 08/20/2021   Procedure: LEFT HEART CATH AND CORS/GRAFTS ANGIOGRAPHY;  Surgeon: Runell Gess, MD;  Location: [facility] INVASIVE CV LAB;  Service: Cardiovascular: CULPRIT 99% dLCx just after stent (Overlapping DES PCI) - otw patent LCx stent, patent RCA stent & Patent LIMA-LAD with CTO of prox LAD.   LEFT HEART CATHETERIZATION WITH CORONARY ANGIOGRAM N/A 05/15/2014   Procedure: LEFT HEART CATHETERIZATION WITH CORONARY ANGIOGRAM;  Surgeon: Lesleigh Noe, MD;  Location: [facility] CATH LAB;  Service: Cardiovascular;  Laterality: N/A;   LEFT HEART CATHETERIZATION WITH CORONARY/GRAFT ANGIOGRAM N/A 06/28/2014   Procedure: LEFT HEART CATHETERIZATION WITH Isabel Caprice;  Surgeon: Lennette Bihari, MD;  Location: [facility] CATH LAB;  Service: Cardiovascular;  Laterality: N/A;   LUMBAR PERCUTANEOUS PEDICLE SCREW 1 LEVEL N/A 11/22/2012   Procedure: LUMBAR PERCUTANEOUS PEDICLE SCREW 1 LEVEL;  Surgeon: Tia Alert, MD;  Location: [facility] NEURO ORS;  Service: Neurosurgery;  Laterality: N/A;  Lumbar Three-Four Percutaneous Pedicle Screw, Lateral approach   MINOR HEMORRHOIDECTOMY     NM  MYOVIEW LTD  05/26/2021   Lexiscan: Intermediate risk (similar findings to last study), with exception of reduced EF roughly 40%..  Prior basal inferior-inferolateral infarct without peri-infarct ischemia.   ->  Echo ordered -> (EF 45 to 50%)-when compared to prior Myoview, infarct appears similar.   PERCUTANEOUS CORONARY STENT INTERVENTION (PCI-S)  06/2003   PCI - RCA 2 overlapping Taxus DES 2.75 mm x 32 mm and 2.75 mm x 12 mm (3.0 mm);  PCI-Cx-OM1 - Taxus DES 3.0 mm x 20 mm (3.1 mm);    PERCUTANEOUS CORONARY STENT INTERVENTION (PCI-S)  12/2005   80% ISR in proximal Taxus stent in RCA -- covered proximally with Cypher DES 3.0 mm x 12 mm   PERCUTANEOUS CORONARY STENT INTERVENTION (PCI-S) N/A 05/17/2014   Procedure: PERCUTANEOUS CORONARY STENT INTERVENTION (PCI-S);  Surgeon: Lesleigh Noe, MD;  Location: [facility] CATH LAB: PCI pRCA stent ISR - Promus DES 3.0 x 8 (3.25), OM distal stent edge - Promus DES 2.75 x 12 (3.1)   PERIPHERAL VASCULAR CATHETERIZATION  11/03/2016   Procedure: Embolization;  Surgeon: Maeola Harman, MD;  Location: [facility] INVASIVE CV LAB;  Service: Cardiovascular;;   POSTERIOR CERVICAL FUSION/FORAMINOTOMY N/A 05/02/2013   Procedure: POSTERIOR CERVICAL FUSION/FORAMINOTOMY CERVICAL SEVEN THORACIC-ONE;  Surgeon: Tia Alert, MD;  Location: [facility] NEURO ORS;  Service: Neurosurgery;  Laterality: N/A;  POSTERIOR CERVICAL FUSION/FORAMINOTOMY CERVICAL SEVEN THORACIC-ONE   SPHINCTEROTOMY N/A 06/30/2015   Procedure: SPHINCTEROTOMY;  Surgeon: Meryl Dare, MD;  Location: [facility] ENDOSCOPY;  Service: Endoscopy;  Laterality: N/A;   TOTAL KNEE ARTHROPLASTY Right    TOTAL KNEE ARTHROPLASTY Left 12/20/2016   Procedure: LEFT TOTAL KNEE ARTHROPLASTY;  Surgeon: Dannielle Huh, MD;  Location: [facility] OR;  Service: Orthopedics;  Laterality: Left;   TRANSTHORACIC ECHOCARDIOGRAM  06/10/2021   EF 45 to 50%.  Mildly reduced function.  Mild hypokinesis of basal-mid inferior and inferolateral wall consistent with  prior infarct.  Severe LA dilation.  Mild to moderate MR with moderate MAC.  Mild calcific AS (mean gradient 11 mmHg) --> LV function seems unchanged.  Diastolic function improved.  Wall motion abnormality unchanged.  AS unchanged.   TRANSTHORACIC ECHOCARDIOGRAM  07/2018   Normal LV size.  EF 50 to 55% with mild HK of inferolateral wall.  GRII DD.  Mild AS with mean gradient 15 mm or greater.  Mild ascending aortic dilation.  Mildly increased PA pressures of 41 mmHg   UPPER GI ENDOSCOPY  07/03/2019   VISCERAL ANGIOGRAM  11/03/2016   Procedure: Visceral Angiogram;  Surgeon: Maeola Harman, MD;  Location: [facility] INVASIVE CV LAB;  Service: Cardiovascular;;    SH: Social History   Tobacco Use   Smoking status: Former    Current packs/day: 0.00    Average packs/day: 3.0 packs/day for 45.0 years (135.0 ttl pk-yrs)    Types: Cigarettes    Start date: 10/04/1938    Quit date: 10/05/1983    Years since quitting: 40.1   Smokeless tobacco: Never  Vaping Use   Vaping status: Never Used  Substance Use Topics   Alcohol use: Yes    Alcohol/week: 7.0 - 14.0 standard drinks of alcohol    Types: 7 - 14 Cans of beer per week    Comment: 1-2 beers per day   Drug use: No    ROS: Constitutional:  Negative for fever, chills, weight loss CV: Negative for chest pain, previous MI, hypertension Respiratory:  Negative for shortness of breath, wheezing, sleep apnea, frequent cough GI:  Negative for nausea, vomiting, bloody stool, GERD  PE: Vitals:   11/15/23 1356  BP: 133/69  Pulse: 79    GENERAL APPEARANCE:  Well appearing, well developed, well nourished, NAD    Results: UA is remarkable for microscopic hematuria as well as low-level bacteriuria   Procedure: Flexible cystourethroscopy  Indication: Gross hematuria  Description of procedure: The patient was brought to the procedure room where the procedure was again reviewed with him and informed consent was  obtained.  The patient was  placed in the supine position and external genitalia were prepped and draped in the usual fashion.  Preprocedural timeout was performed.  Flexible cystoscopy revealed a normal anterior urethra.  The patient has a large prostate with a long friable prostatic urethra which bleeds easily with passage of scope especially near the bladder neck.  There is also some intravesical extension of the prostate.  The bladder was subsequently entered and carefully inspected.  This revealed severe bladder trabeculation with large cellule formation.  There was also a dependent clot in the left bladder floor which would move around quite easily with irrigation.  There were no focal mucosal lesions appreciated.  The ureteral openings were not definitely visualized due to severe trabeculation.  Cytology was not obtained due to using sterile water for irrigation.  Voided cytology was sent today.  Procedure well-tolerated

## 2023-11-16 ENCOUNTER — Ambulatory Visit: Payer: Medicare Other | Admitting: Family Medicine

## 2023-11-17 ENCOUNTER — Ambulatory Visit: Payer: Self-pay | Admitting: Urology

## 2023-11-17 ENCOUNTER — Encounter: Payer: Self-pay | Admitting: Urology

## 2023-11-22 ENCOUNTER — Other Ambulatory Visit: Payer: Self-pay | Admitting: Cardiology

## 2023-12-05 ENCOUNTER — Other Ambulatory Visit: Payer: Self-pay | Admitting: Family Medicine

## 2023-12-05 ENCOUNTER — Ambulatory Visit: Payer: Medicare Other | Admitting: Cardiology

## 2023-12-05 DIAGNOSIS — I110 Hypertensive heart disease with heart failure: Secondary | ICD-10-CM

## 2023-12-19 LAB — HM DIABETES EYE EXAM

## 2024-01-27 NOTE — Progress Notes (Signed)
 Cardiology Office Note:    Date:  02/03/2024   ID:  ISSAIH KAUS, DOB 04-11-1936, MRN [id number]  PCP:  Watson Hacking, MD   [facility] Providers Cardiologist:  Randene Bustard, MD Cardiology APP:  Lamond Pilot, PA     Referring MD: Watson Hacking, MD   Chief Complaint  Patient presents with   Follow-up    PAF, CABG    History of Present Illness:    JUNG YURCHAK is a 88 y.o. male with a hx of extensive cardiac history including CAD since 1985 with multiple stenting and ultimately a CABG in 1997 with patent LIMA-LAD, DM2, hypertension, CHF, EVAR for AAA followed by Dr. Vikki Graves, asthma, GERD, Afib (new 12/2022) and hyperlipidemia.  Heart cath in 05/2014 with DES to ISR of RCA and OM1.  Nuclear stress test in 05/2021 with intermediate risk and similar findings still prior study.  Echo 06/10/2021 showed LVEF with 45 to 50% and grade 1 diastolic dysfunction, WMA, mildly reduced RV function, severely dilated left atrium, mild to moderate MR, and mild AS.  He has been on Plavix  monotherapy and is intolerant to statins.  He has not been interested in PCSK9 inhibitor or Nexletol .  He is maintained on Zetia  and fenofibrate .  Antianginal regimen includes amlodipine  and ranolazine .  He has CKD with serum creatinine 1.5-1.6.  He presented to [facility] ED on 08/19/2021 with chest pain.  He underwent left heart catheterization with continued patency of the LIMA-LAD but distal circumflex disease treated with DES.  He was started on aspirin  and Brilinta .  He is not on a beta-blocker due to history of bradycardia.  Recent CT abdomen pelvis with possible persistent endoleak resulting in aneurysmal sac enlargement.  He was advised to follow-up with the [facility] as outpatient. I saw him post cath for TOC.   He presents for outpatient follow-up, TOC post cath. He was switched from brilinta  to plavix  for SOB. He was recommended for long-term plavix  monotherapy after 12 months of DAPT.   Not on BB due to  fatigue Statin myopathy - on zetia  and nexletol   SBP goal < 160 given age - on ARB and HCTZ  Last seen by Dr. Addie Holstein and continued to complain of fatigue. Avoiding BB. Felt related to balance issues causing increased effort. Vestibular therapy was recommended.   During a preoperative evaluation with anesthesia, EKG noted to be new A-fib.  He contacted cardiology office who confirmed atrial fibrillation and he was started on Eliquis  2.5 mg twice daily with discontinuation of aspirin . During follow up 03/2023, he was only taking eliquis  once daily due to cost. I provided patient assistance.   Echo was obtained and showed persistent EF of 45-50%, mildly reduced RV function, elevated PASP, severe BAE, mild to moderate MR, mild AS.  Unfortunately he called our office with reports of black stool and was advised to stop Eliquis .  CBC was repeated and hemoglobin was 10.7, this was up from 10.0 when checked in June.  He was advised to follow-up with his PCP to evaluate anemia.  He called our office asking for refills of Plavix , however was advised to not take Plavix  but continue Eliquis  2.5 mg twice daily.   He has since followed up with urology for gross hematuria and underwent cystoscopy that showed resolving clot in the bladder but no focal mucosal lesions.  He does have a large friable prostate with intravesical extension felt likely the source of his bleeding on anticoagulation.  Proscar  started,  avoiding alpha-blocker given issues with unsteady gait and falls. Eliquis  and plavix  have been discontinued, he is also not taking ASA.   He presents today for follow up. He is now off OAC and anti-platelets. Will ask urology if they are ok with ASA. I will also refer to EP for consideration of watchman device.   LE edema, has compression stockings and PRN lasix . Will increase this to 40 mg lasix  PRN.    Past Medical History:  Diagnosis Date   Adenomatous colon polyp    Ankle edema    Chronic   Asthma     CAD S/P percutaneous coronary angioplasty 2004   a) '04: Staged Taxus DES PCI to RCA (2 Taxus 2.75 x 32 & 12) and Cx-OM (Taxus 3 x 20);;'07 - PCI to pRCA ISR- Cypher DES; c) 05/2014: pPCI pRCA stentISR (Promus DES 3 x 8) OM1 distal to stent (Promus DES 2.75 x 12 - 3.1); 11/'22: distal LCx stent edge 99% -> Onyx Frontier DES 2.5 x 15 - 2.8 (overlaps prior stent)   CAD, multiple vessel 1985   Most recent July 2018: Admitted for non-STEMI. Occluded LAD with patent LIMA (SP1 now occluded).  Patent stents in proximal and mid RCA as well as circumflex-OM 3. Known occlusion of SVG-OM. EF 45-50%   CHF (congestive heart failure) (HCC)    Cholelithiasis - with cholangitis & choledocholithiasis    status post ERCP with removal of calculi and biliary stent placement.   Chronic anemia    On iron supplement; history of positive guaiac - negative colonoscopy in 1996.; Thought to be related to hemorrhoids; status post hemorrhoidectomy   Chronic back pain     multiple surgeries; C-spine and lumbar   Diabetes mellitus    no meds- pt states he is no longer DM   Diverticulitis of colon 1996   Diverticulosis    Dyslipidemia, goal LDL below 70    Erectile dysfunction    Exertional dyspnea    Chronic baseline SOB with ambulation   GERD (gastroesophageal reflux disease)    Grade II diastolic dysfunction    H/O: pneumonia 11/2012   Hemorrhoids    Hiatal hernia    History of: ST elevation myocardial infarction (STEMI) involving left circumflex coronary artery with complication 1985   PTCA-circumflex; PCI in 1991   Hypertension    Moderate aortic stenosis by prior echocardiogram 07/2015   Normal LV function - EF 55-60%.. Abnormal relaxation. Mild-moderate aortic stenosis (peak/mean gradient 18/10 mmH)   Osteoarthritis of both knees    And back; multiple back surgeries, right knee arthroplasty and left knee arthroscopic surgery x2   Pneumonia 2016   S/P AAA (abdominal aortic aneurysm) repair 08/30/2012   s/p  EVAR   S/P CABG x 2 1997   LIMA-LAD, SVG-OM   Sleep apnea    pt. states he was told to return for f/u, to be fitted for Cpap, but pt. reports that he didn't follow up-no cpap used   Statin myopathy 09/30/2015    Past Surgical History:  Procedure Laterality Date   Abdominal and Lower Extremity Arterial Ultrasound  08/23/2012; 10/10/2013   Normal ABIs. Nonocclusive lower extremity disease. 4.2 cm x 4.3 cm infrarenal AAA;; 4.4 cm x 4.3 cm (essentially stable)    ABDOMINAL AORTIC ENDOVASCULAR STENT GRAFT N/A 08/13/2015   Procedure: ABDOMINAL AORTIC ENDOVASCULAR STENT GRAFT;  Surgeon: Palma Bob, MD;  Location: [facility] OR;  Service: Vascular;  Laterality: N/A;   Anterior cervical plating  04/23/2010   At  C4-5 and a C6-7 utilizing two separate Biomet MaxAn plates.   ANTERIOR LAT LUMBAR FUSION Left 11/22/2012   Procedure: ANTERIOR LATERAL LUMBAR FUSION 1 LEVEL;  Surgeon: Isadora Mar, MD;  Location: [facility] NEURO ORS;  Service: Neurosurgery;  Laterality: Left;  Anterior Lateral Lumbar Fusion Lumbar Three-Four   BACK SURGERY  1979 & x 10   pt. remarks, "I have had about 10 back surgeries"   BILIARY STENT PLACEMENT N/A 07/03/2014   Procedure: BILIARY STENT PLACEMENT;  Surgeon: Claudette Cue, MD;  Location: [facility] ENDOSCOPY;  Service: Endoscopy;  Laterality: N/A;   CARPAL TUNNEL RELEASE Left 11/16/2019   Procedure: LEFT CARPAL TUNNEL RELEASE;  Surgeon: Arnie Lao, MD;  Location: [facility] ORS;  Service: Orthopedics;  Laterality: Left;   CARPAL TUNNEL RELEASE Right 01/10/2023   Procedure: RIGHT CARPAL TUNNEL RELEASE;  Surgeon: Isadora Mar, MD;  Location: [facility] OR;  Service: Neurosurgery;  Laterality: Right;   CATARACT EXTRACTION     Cervical arthrodesis  04/23/2010   Anterior cervical arthrodesis, C4-5, C6-7 utilizing 7-mm PEEK interbody cage packed with local autograft & Antifuse putty at C4-5 & an 8-mm cage at C6-7.   CERVICAL DISCECTOMY  04/23/2010   Decompressive anterior carvical diskectomy.  C4-5, C6-7   CHOLECYSTECTOMY N/A 05/23/2015   Procedure: LAPAROSCOPIC CHOLECYSTECTOMY WITH INTRAOPERATIVE CHOLANGIOGRAM;  Surgeon: Juanita Norlander, MD;  Location: [facility] ORS;  Service: General;  Laterality: N/A;   COLONOSCOPY  1996   CORONARY ANGIOPLASTY  1985,1991,15   1985 lateral STEMI Circumflex PTCA;    CORONARY ARTERY BYPASS GRAFT  1997   LIMA-LAD, SVG-OM (SVG known to be occluded prior to 2004)   CORONARY STENT INTERVENTION N/A 08/20/2021   Procedure: CORONARY STENT INTERVENTION;  Surgeon: Avanell Leigh, MD;  Location: [facility] INVASIVE CV LAB;  Service: Cardiovascular:  dLCx distal Edge 99% => Onyx Frontier DES 2.5 x 15 (2.8 mm) overlaps prior stent distally.   ERCP N/A 07/03/2014   Procedure: ENDOSCOPIC RETROGRADE CHOLANGIOPANCREATOGRAPHY (ERCP);  Surgeon: Claudette Cue, MD;  Location: [facility] ENDOSCOPY;  Service: Endoscopy;  Laterality: N/A;   ERCP N/A 07/05/2014   Procedure: ENDOSCOPIC RETROGRADE CHOLANGIOPANCREATOGRAPHY (ERCP);  Surgeon: Claudette Cue, MD;  Location: [facility] ENDOSCOPY;  Service: Endoscopy;  Laterality: N/A;   ERCP N/A 06/30/2015   Procedure: ENDOSCOPIC RETROGRADE CHOLANGIOPANCREATOGRAPHY (ERCP);  Surgeon: Asencion Blacksmith, MD;  Location: [facility] ENDOSCOPY;  Service: Endoscopy;  Laterality: N/A;   EYE SURGERY     IR ANGIOGRAM EXTREMITY LEFT  01/18/2017   IR ANGIOGRAM PELVIS SELECTIVE OR SUPRASELECTIVE  01/18/2017   IR ANGIOGRAM SELECTIVE EACH ADDITIONAL VESSEL  01/18/2017   IR AORTAGRAM ABDOMINAL SERIALOGRAM  01/18/2017   IR EMBO ARTERIAL NOT HEMORR HEMANG INC GUIDE ROADMAPPING  01/18/2017   IR RADIOLOGIST EVAL & MGMT  01/04/2017   IR RADIOLOGIST EVAL & MGMT  10/26/2017   IR RADIOLOGIST EVAL & MGMT  05/24/2018   IR RADIOLOGIST EVAL & MGMT  06/19/2019   IR RADIOLOGIST EVAL & MGMT  06/11/2020   IR RADIOLOGIST EVAL & MGMT  07/02/2021   IR RADIOLOGIST EVAL & MGMT  06/22/2022   IR RADIOLOGIST EVAL & MGMT  07/08/2023   IR US  GUIDE VASC ACCESS RIGHT  01/18/2017   KNEE ARTHROSCOPY Left     x 2   LEFT HEART CATH AND CORS/GRAFTS ANGIOGRAPHY  06/2003   None Occluded vein graft to OM; diffuse RCA disease in the mid vessel, 80% circumflex-OM stenosis; follow on AV groove circumflex with sequential 90% stenoses and intervening saccular dilation  LEFT HEART CATH AND CORS/GRAFTS ANGIOGRAPHY  12/2008   4 abnormal Myoview  showing apical thinning (possibly due to apical LAD 95%) : 100% Occluded LAD after SV1, distal LAD grafted via LIMA -apical 95% . Cx -OM1 w/patent stent extending into OM 1 . Follow on Cx - 70-80% - non-amenable PCI. RCA widely patent 3 overlapping stents in mRCA w/less than 40% stenosisin RPL; SVG-OM known occluded    LEFT HEART CATH AND CORS/GRAFTS ANGIOGRAPHY N/A 04/20/2017   Procedure: Left Heart Cath and Cors/Grafts Angiography;  Surgeon: Avanell Leigh, MD;  Location: [facility] INVASIVE CV LAB;  LAD now occluded prior to SP1.LIMA-LAD. Patent RCA and circumflex stents. EF 45-50%. Relatively stable.    LEFT HEART CATH AND CORS/GRAFTS ANGIOGRAPHY N/A 08/20/2021   Procedure: LEFT HEART CATH AND CORS/GRAFTS ANGIOGRAPHY;  Surgeon: Avanell Leigh, MD;  Location: [facility] INVASIVE CV LAB;  Service: Cardiovascular: CULPRIT 99% dLCx just after stent (Overlapping DES PCI) - otw patent LCx stent, patent RCA stent & Patent LIMA-LAD with CTO of prox LAD.   LEFT HEART CATHETERIZATION WITH CORONARY ANGIOGRAM N/A 05/15/2014   Procedure: LEFT HEART CATHETERIZATION WITH CORONARY ANGIOGRAM;  Surgeon: Mickiel Albany, MD;  Location: [facility] CATH LAB;  Service: Cardiovascular;  Laterality: N/A;   LEFT HEART CATHETERIZATION WITH CORONARY/GRAFT ANGIOGRAM N/A 06/28/2014   Procedure: LEFT HEART CATHETERIZATION WITH Estella Helling;  Surgeon: Millicent Ally, MD;  Location: [facility] CATH LAB;  Service: Cardiovascular;  Laterality: N/A;   LUMBAR PERCUTANEOUS PEDICLE SCREW 1 LEVEL N/A 11/22/2012   Procedure: LUMBAR PERCUTANEOUS PEDICLE SCREW 1 LEVEL;  Surgeon: Isadora Mar, MD;  Location: [facility] NEURO ORS;   Service: Neurosurgery;  Laterality: N/A;  Lumbar Three-Four Percutaneous Pedicle Screw, Lateral approach   MINOR HEMORRHOIDECTOMY     NM MYOVIEW  LTD  05/26/2021   Lexiscan : Intermediate risk (similar findings to last study), with exception of reduced EF roughly 40%..  Prior basal inferior-inferolateral infarct without peri-infarct ischemia.   ->  Echo ordered -> (EF 45 to 50%)-when compared to prior Myoview , infarct appears similar.   PERCUTANEOUS CORONARY STENT INTERVENTION (PCI-S)  06/2003   PCI - RCA 2 overlapping Taxus DES 2.75 mm x 32 mm and 2.75 mm x 12 mm (3.0 mm); PCI-Cx-OM1 - Taxus DES 3.0 mm x 20 mm (3.1 mm);    PERCUTANEOUS CORONARY STENT INTERVENTION (PCI-S)  12/2005   80% ISR in proximal Taxus stent in RCA -- covered proximally with Cypher DES 3.0 mm x 12 mm   PERCUTANEOUS CORONARY STENT INTERVENTION (PCI-S) N/A 05/17/2014   Procedure: PERCUTANEOUS CORONARY STENT INTERVENTION (PCI-S);  Surgeon: Mickiel Albany, MD;  Location: [facility] CATH LAB: PCI pRCA stent ISR - Promus DES 3.0 x 8 (3.25), OM distal stent edge - Promus DES 2.75 x 12 (3.1)   PERIPHERAL VASCULAR CATHETERIZATION  11/03/2016   Procedure: Embolization;  Surgeon: Adine Hoof, MD;  Location: [facility] INVASIVE CV LAB;  Service: Cardiovascular;;   POSTERIOR CERVICAL FUSION/FORAMINOTOMY N/A 05/02/2013   Procedure: POSTERIOR CERVICAL FUSION/FORAMINOTOMY CERVICAL SEVEN THORACIC-ONE;  Surgeon: Isadora Mar, MD;  Location: [facility] NEURO ORS;  Service: Neurosurgery;  Laterality: N/A;  POSTERIOR CERVICAL FUSION/FORAMINOTOMY CERVICAL SEVEN THORACIC-ONE   SPHINCTEROTOMY N/A 06/30/2015   Procedure: SPHINCTEROTOMY;  Surgeon: Asencion Blacksmith, MD;  Location: [facility] ENDOSCOPY;  Service: Endoscopy;  Laterality: N/A;   TOTAL KNEE ARTHROPLASTY Right    TOTAL KNEE ARTHROPLASTY Left 12/20/2016   Procedure: LEFT TOTAL KNEE ARTHROPLASTY;  Surgeon: Christie Cox, MD;  Location: [facility] OR;  Service: Orthopedics;  Laterality: Left;   TRANSTHORACIC  ECHOCARDIOGRAM  06/10/2021   EF 45 to 50%.  Mildly reduced function.  Mild hypokinesis of basal-mid inferior and inferolateral wall consistent with prior infarct.  Severe LA dilation.  Mild to moderate MR with moderate MAC.  Mild calcific AS (mean gradient 11 mmHg) --> LV function seems unchanged.  Diastolic function improved.  Wall motion abnormality unchanged.  AS unchanged.   TRANSTHORACIC ECHOCARDIOGRAM  07/2018   Normal LV size.  EF 50 to 55% with mild HK of inferolateral wall.  GRII DD.  Mild AS with mean gradient 15 mm or greater.  Mild ascending aortic dilation.  Mildly increased PA pressures of 41 mmHg   UPPER GI ENDOSCOPY  07/03/2019   VISCERAL ANGIOGRAM  11/03/2016   Procedure: Visceral Angiogram;  Surgeon: Adine Hoof, MD;  Location: [facility] INVASIVE CV LAB;  Service: Cardiovascular;;    Current Medications: Current Meds  Medication Sig   acetaminophen  (TYLENOL ) 500 MG tablet Take 1,000 mg by mouth every 6 (six) hours as needed for mild pain (pain score 1-3), moderate pain (pain score 4-6) or headache. Reported on 03/16/2016   albuterol  (VENTOLIN  HFA) 108 (90 Base) MCG/ACT inhaler Inhale 2 puffs into the lungs every 6 (six) hours as needed for wheezing or shortness of breath.   amLODipine  (NORVASC ) 5 MG tablet Take 1 tablet by mouth once daily   Choline Fenofibrate  (FENOFIBRIC ACID ) 135 MG CPDR Take 1 tablet by mouth daily.   ezetimibe  (ZETIA ) 10 MG tablet Take 1 tablet by mouth once daily   Ferrous Sulfate  (IRON PO) Take 65 mg by mouth daily.   finasteride  (PROSCAR ) 5 MG tablet Take 1 tablet (5 mg total) by mouth daily.   fluticasone -salmeterol (ADVAIR) 100-50 MCG/ACT AEPB Inhale 1 puff into the lungs 2 (two) times daily.   glucose blood (ONETOUCH VERIO) test strip 1 each by Other route as needed for other. Use as instructed   Multiple Vitamins-Minerals (MULTIVITAMIN WITH MINERALS) tablet Take 1 tablet by mouth daily.   pantoprazole  (PROTONIX ) 40 MG tablet Take 1 tablet by  mouth once daily   ranolazine  (RANEXA ) 500 MG 12 hr tablet Take 1 tablet by mouth twice daily   sildenafil  (VIAGRA ) 100 MG tablet TAKE ONE TABLET BY MOUTH DAILY AS NEEDED FOR ERECTILE DYSFUNCTION   valsartan -hydrochlorothiazide  (DIOVAN -HCT) 320-12.5 MG tablet Take 1 tablet by mouth once daily   [DISCONTINUED] furosemide  (LASIX ) 20 MG tablet Take 1 tablet (20 mg total) by mouth daily as needed.     Allergies:   Oxycodone , Lisinopril, Statins, and Welchol [colesevelam hcl]   Social History   Socioeconomic History   Marital status: Married    Spouse name: Not on file   Number of children: Not on file   Years of education: Not on file   Highest education level: Not on file  Occupational History   Not on file  Tobacco Use   Smoking status: Former    Current packs/day: 0.00    Average packs/day: 3.0 packs/day for 45.0 years (135.0 ttl pk-yrs)    Types: Cigarettes    Start date: 10/04/1936    Quit date: 10/05/1983    Years since quitting: 40.3   Smokeless tobacco: Never  Vaping Use   Vaping status: Never Used  Substance and Sexual Activity   Alcohol use: Yes    Alcohol/week: 7.0 - 14.0 standard drinks of alcohol    Types: 7 - 14 Cans of beer per week    Comment: 1-2 beers per day  Drug use: No   Sexual activity: Not Currently  Other Topics Concern   Not on file  Social History Narrative   He is married, father of two, grandfather to four, great grandfather to two.    Not really getting much exercise now, do to his significant back and hip pain.    He does not smoke and only has an alcoholic beverage.    Social Drivers of Corporate investment banker Strain: Not on file  Food Insecurity: Not on file  Transportation Needs: Not on file  Physical Activity: Not on file  Stress: Not on file  Social Connections: Not on file     Family History: The patient's family history includes Arthritis in his mother; Breast cancer in his sister; Diabetes in his father and sister; Heart  disease in his brother, father, and sister; Hypertension in his sister; Stroke in his sister; Ulcers in his brother. There is no history of Colon cancer.  ROS:   Please see the history of present illness.     All other systems reviewed and are negative.  EKGs/Labs/Other Studies Reviewed:    The following studies were reviewed today:  EKG Interpretation Date/Time:  Friday Feb 03 2024 13:39:05 EDT Ventricular Rate:  74 PR Interval:    QRS Duration:  104 QT Interval:  402 QTC Calculation: 446 R Axis:   -7  Text Interpretation: Atrial fibrillation Nonspecific T wave abnormality When compared with ECG of 29-Jun-2023 15:21, No significant change was found Confirmed by Marcie Sever ([id number]) on 02/03/2024 1:45:43 PM    Recent Labs: 03/30/2023: Magnesium  2.1; TSH 5.980 08/08/2023: ALT 21; B Natriuretic Peptide 174.4; BUN 26; Creatinine, Ser 1.45; Potassium 3.9; Sodium 140 11/01/2023: Hemoglobin 10.9; Platelets 187  Recent Lipid Panel    Component Value Date/Time   CHOL 188 11/02/2022 1130   TRIG 102 11/02/2022 1130   HDL 48 11/02/2022 1130   CHOLHDL 3.9 11/02/2022 1130   CHOLHDL 3.5 08/20/2021 0427   VLDL 17 08/20/2021 0427   LDLCALC 121 (H) 11/02/2022 1130     Risk Assessment/Calculations:    CHA2DS2-VASc Score = 5   This indicates a 7.2% annual risk of stroke. The patient's score is based upon: CHF History: 1 HTN History: 1 Diabetes History: 0 Stroke History: 0 Vascular Disease History: 1 Age Score: 2 Gender Score: 0        Physical Exam:    VS:  BP 129/60   Pulse 74   Ht 5\' 9"  (1.753 m)   Wt 197 lb 9.6 oz (89.6 kg)   SpO2 98%   BMI 29.18 kg/m     Wt Readings from Last 3 Encounters:  02/03/24 197 lb 9.6 oz (89.6 kg)  11/15/23 206 lb (93.4 kg)  11/01/23 200 lb (90.7 kg)     GEN:  Well nourished, well developed in no acute distress HEENT: Normal NECK: No JVD; No carotid bruits LYMPHATICS: No lymphadenopathy CARDIAC: irregular rhythm, regular  rate RESPIRATORY:  Clear to auscultation without rales, wheezing or rhonchi  ABDOMEN: Soft, non-tender, non-distended MUSCULOSKELETAL:  1+ B LE edema; No deformity  SKIN: Warm and dry NEUROLOGIC:  Alert and oriented x 3 PSYCHIATRIC:  Normal affect   ASSESSMENT:    1. Paroxysmal atrial fibrillation (HCC)   2. Balance disorder   3. Mild aortic stenosis by prior echocardiogram   4. Chronic diastolic CHF (congestive heart failure) (HCC)   5. Hematuria, unspecified type   6. CAD S/P CABG '95- several PCis since, last 05/17/14  7. Primary hypertension   8. Hyperlipidemia with target LDL less than 70   9. Valvular heart disease    PLAN:    In order of problems listed above:   Paroxysmal atrial fibrillation Noted during PAT clinic 12/2022 Now on 2.5 mg eliquis  BID - discontinued for gross hematuria and clots EKG today with rate controlled AFib   CAD with history of CABG 1997 DES to distal Lcx Has stopped antiplatelet therapy for OAC, no OAC stopped for bleeding No beta-blocker with history of bradycardia  And fatigue Continue ranolazine  and amlodipine  - will ask urology about taking ASA   Hyperlipidemia with LDL goal < 70 11/02/2022: Cholesterol, Total 188; HDL 48; LDL Chol Calc (NIH) 121; Triglycerides 102 On Zetia  and fenofibrate  Statin intolerance, not interested in PCSK9 inhibitor Nexlitol added at last visit Tells me he did try repatha  and didn't tolerate it   Hypertension Continue amlodipine , valsartan -HCTZ   VHD Mild to moderate MR Mild AS - repeat echo 04/2024   AAA with possible endoleak and aneurysmal sac - he has annual CT scan, followed by Dr. Mabel Savage   Follow up with Dr. Addie Holstein in 6 months      Medication Adjustments/Labs and Tests Ordered: Current medicines are reviewed at length with the patient today.  Concerns regarding medicines are outlined above.  Orders Placed This Encounter  Procedures   Ambulatory referral to Home Health   Ambulatory  referral to Cardiac Electrophysiology   EKG 12-Lead   ECHOCARDIOGRAM COMPLETE   Meds ordered this encounter  Medications   furosemide  (LASIX ) 20 MG tablet    Sig: Take 2 tablets (40 mg total) by mouth daily as needed.    Dispense:  30 tablet    Refill:  6    Patient Instructions  Medication Instructions:  Increase Lasix  to 40 mg once a day as needed *If you need a refill on your cardiac medications before your next appointment, please call your pharmacy*  Lab Work: No labs  Testing/Procedures: We are going to schedule you for an Echocardiogram in July Your physician has requested that you have an echocardiogram. Echocardiography is a painless test that uses sound waves to create images of your heart. It provides your doctor with information about the size and shape of your heart and how well your heart's chambers and valves are working. This procedure takes approximately one hour. There are no restrictions for this procedure. Please do NOT wear cologne, perfume, aftershave, or lotions (deodorant is allowed). Please arrive 15 minutes prior to your appointment time.  Please note: We ask at that you not bring children with you during ultrasound (echo/ vascular) testing. Due to room size and safety concerns, children are not allowed in the ultrasound rooms during exams. Our front office staff cannot provide observation of children in our lobby area while testing is being conducted. An adult accompanying a patient to their appointment will only be allowed in the ultrasound room at the discretion of the ultrasound technician under special circumstances. We apologize for any inconvenience.  Follow-Up: At [facility], you and your health needs are our priority.  As part of our continuing mission to provide you with exceptional heart care, our providers are all part of one team.  This team includes your primary Cardiologist (physician) and Advanced Practice Providers or APPs (Physician  Assistants and Nurse Practitioners) who all work together to provide you with the care you need, when you need it.  Your next appointment:   6 month(s)  Provider:   Randene Bustard, MD  We recommend signing up for the patient portal called "MyChart".  Sign up information is provided on this After Visit Summary.  MyChart is used to connect with patients for Virtual Visits (Telemedicine).  Patients are able to view lab/test results, encounter notes, upcoming appointments, etc.  Non-urgent messages can be sent to your provider as well.   To learn more about what you can do with MyChart, go to [email or web address].   Other Instructions We are sending a referral over to EP to have patient scheduled for appointment with for the possibility of a watchman. We would like for you to discuss with Dr. Willye Harvey about restarting aspirin    Signed, Lamond Pilot, [place]  02/03/2024 2:36 PM     [facility]

## 2024-02-03 ENCOUNTER — Encounter: Payer: Self-pay | Admitting: Physician Assistant

## 2024-02-03 ENCOUNTER — Ambulatory Visit: Payer: Medicare Other | Attending: Physician Assistant | Admitting: Physician Assistant

## 2024-02-03 VITALS — BP 129/60 | HR 74 | Ht 69.0 in | Wt 197.6 lb

## 2024-02-03 DIAGNOSIS — R2689 Other abnormalities of gait and mobility: Secondary | ICD-10-CM | POA: Diagnosis not present

## 2024-02-03 DIAGNOSIS — I10 Essential (primary) hypertension: Secondary | ICD-10-CM

## 2024-02-03 DIAGNOSIS — I350 Nonrheumatic aortic (valve) stenosis: Secondary | ICD-10-CM | POA: Diagnosis not present

## 2024-02-03 DIAGNOSIS — E785 Hyperlipidemia, unspecified: Secondary | ICD-10-CM

## 2024-02-03 DIAGNOSIS — I38 Endocarditis, valve unspecified: Secondary | ICD-10-CM

## 2024-02-03 DIAGNOSIS — I5032 Chronic diastolic (congestive) heart failure: Secondary | ICD-10-CM

## 2024-02-03 DIAGNOSIS — I480 Paroxysmal atrial fibrillation: Secondary | ICD-10-CM | POA: Diagnosis not present

## 2024-02-03 DIAGNOSIS — R319 Hematuria, unspecified: Secondary | ICD-10-CM

## 2024-02-03 DIAGNOSIS — I25119 Atherosclerotic heart disease of native coronary artery with unspecified angina pectoris: Secondary | ICD-10-CM

## 2024-02-03 MED ORDER — FUROSEMIDE 20 MG PO TABS: 40.0000 mg | ORAL_TABLET | Freq: Every day | ORAL | 6 refills | Status: DC | PRN

## 2024-02-03 NOTE — Patient Instructions (Addendum)
 Medication Instructions:  Increase Lasix  to 40 mg once a day as needed *If you need a refill on your cardiac medications before your next appointment, please call your pharmacy*  Lab Work: No labs  Testing/Procedures: We are going to schedule you for an Echocardiogram in July Your physician has requested that you have an echocardiogram. Echocardiography is a painless test that uses sound waves to create images of your heart. It provides your doctor with information about the size and shape of your heart and how well your heart's chambers and valves are working. This procedure takes approximately one hour. There are no restrictions for this procedure. Please do NOT wear cologne, perfume, aftershave, or lotions (deodorant is allowed). Please arrive 15 minutes prior to your appointment time.  Please note: We ask at that you not bring children with you during ultrasound (echo/ vascular) testing. Due to room size and safety concerns, children are not allowed in the ultrasound rooms during exams. Our front office staff cannot provide observation of children in our lobby area while testing is being conducted. An adult accompanying a patient to their appointment will only be allowed in the ultrasound room at the discretion of the ultrasound technician under special circumstances. We apologize for any inconvenience.  Follow-Up: At [facility], you and your health needs are our priority.  As part of our continuing mission to provide you with exceptional heart care, our providers are all part of one team.  This team includes your primary Cardiologist (physician) and Advanced Practice Providers or APPs (Physician Assistants and Nurse Practitioners) who all work together to provide you with the care you need, when you need it.  Your next appointment:   6 month(s)  Provider:   Randene Bustard, MD  We recommend signing up for the patient portal called "MyChart".  Sign up information is provided on  this After Visit Summary.  MyChart is used to connect with patients for Virtual Visits (Telemedicine).  Patients are able to view lab/test results, encounter notes, upcoming appointments, etc.  Non-urgent messages can be sent to your provider as well.   To learn more about what you can do with MyChart, go to [email or web address].   Other Instructions We are sending a referral over to EP to have patient scheduled for appointment with for the possibility of a watchman. We would like for you to discuss with Dr. Willye Harvey about restarting aspirin 

## 2024-02-10 ENCOUNTER — Ambulatory Visit: Payer: Medicare Other | Admitting: Urology

## 2024-02-24 ENCOUNTER — Ambulatory Visit: Attending: Cardiology | Admitting: Cardiology

## 2024-02-24 ENCOUNTER — Encounter: Payer: Self-pay | Admitting: Cardiology

## 2024-02-24 VITALS — BP 132/60 | HR 79 | Ht 69.0 in | Wt 197.0 lb

## 2024-02-24 DIAGNOSIS — D6869 Other thrombophilia: Secondary | ICD-10-CM

## 2024-02-24 DIAGNOSIS — I4821 Permanent atrial fibrillation: Secondary | ICD-10-CM | POA: Diagnosis not present

## 2024-02-24 NOTE — Progress Notes (Signed)
 Electrophysiology Office Note:   Date:  02/24/2024  ID:  [name], DOB 1936-08-24, MRN [id number]  Primary Cardiologist: Julian Bustard, MD Electrophysiologist: Julian Kohler, MD      History of Present Illness:   [name] is a 88 y.o. male with h/o CAD since 1985 with multiple stenting and ultimately a CABG in 1997 with patent LIMA-LAD, DM2, hypertension, CHF, EVAR for AAA followed by [name]. Vikki [name], asthma, GERD, Afib (new 12/2022), GI bleeding w/ melena and hematuria who is being seen today for evaluation for Watchman device at the request of  [name], [place].  Discussed the use of AI scribe software for clinical note transcription with the patient, who gave verbal consent to proceed.  History of Present Illness [name] is an 88 year old male with atrial fibrillation who presents for consideration of a Watchman device due to bleeding complications with oral anticoagulation. He has blood in his stool previously and more recently in his urine, leading to a referral to a urologist. He is not currently on any oral anti-coagulation due to these issues. He was previously on Eliquis  at a dose of 5 mg twice daily, which he reduced to 2.5 mg once daily. He has also been on Plavix  and aspirin  in the past. His urologist advised him to stop taking Eliquis  and Plavix  due to bleeding concerns. He is scheduled to see a urologist, [name]. Willye [name], next Friday for further evaluation of his bleeding issues. He experiences poor balance, has fallen recently, and reports back, neck, and collarbone issues, which limit his physical activity. He mostly sits around due to these problems.  Review of systems complete and found to be negative unless listed in HPI.   EP Information / Studies Reviewed:    EKG is not ordered today. EKG from 02/03/24 reviewed which showed AF.      Echo 04/27/23:   1. Left ventricular ejection fraction, by estimation, is 45 to 50%. The  left ventricle has mildly decreased  function. The left ventricle  demonstrates global hypokinesis. The left ventricular internal cavity size  was severely dilated. Left ventricular  diastolic function could not be evaluated.   2. Right ventricular systolic function is mildly reduced. The right  ventricular size is mildly enlarged. There is moderately elevated  pulmonary artery systolic pressure. The estimated right ventricular  systolic pressure is 52.0 mmHg.   3. Left atrial size was severely dilated.   4. Right atrial size was mild to moderately dilated.   5. The mitral valve is degenerative. Mild to moderate mitral valve  regurgitation. No evidence of mitral stenosis.   6. The aortic valve is tricuspid. Aortic valve regurgitation is not  visualized. Mild aortic valve stenosis. Aortic valve mean gradient  measures 10.4 mmHg. Aortic valve Vmax measures 2.17 m/s.   7. The inferior vena cava is dilated in size with <50% respiratory  variability, suggesting right atrial pressure of 15 mmHg.   LHC 08/2021:   Ost LAD to Prox LAD lesion is 100% stenosed.   Dist Cx lesion is 99% stenosed.   Previously placed Prox RCA to Mid RCA stent (unknown type) is  widely patent.   Previously placed Mid Cx to Dist Cx stent (unknown type) is  widely patent.   A drug-eluting stent was successfully placed to the distal LCX/OM.   Post intervention, there is a 0% residual stenosis.   Post intervention, there is a 0% residual stenosis.  Risk Assessment/Calculations:    CHA2DS2-VASc Score = 5  This indicates a 7.2% annual risk of stroke. The patient's score is based upon: CHF History: 1 HTN History: 1 Diabetes History: 0 Stroke History: 0 Vascular Disease History: 1 Age Score: 2 Gender Score: 0             Physical Exam:   VS:  BP 132/60   Pulse 79   Ht 5\' 9"  (1.753 m)   Wt 197 lb (89.4 kg)   SpO2 98%   BMI 29.09 kg/m    Wt Readings from Last 3 Encounters:  02/24/24 197 lb (89.4 kg)  02/03/24 197 lb 9.6 oz (89.6 kg)   11/15/23 206 lb (93.4 kg)     GEN: Well nourished, well developed in no acute distress NECK: No JVD CARDIAC: Normal rate, irregular RESPIRATORY:  Clear to auscultation without rales, wheezing or rhonchi  ABDOMEN: Soft, non-distended EXTREMITIES:  No edema; No deformity   ASSESSMENT AND PLAN:   I have seen [name] in the office today who is being considered for a Watchman left atrial appendage closure device. I believe they will benefit from this procedure given their history of atrial fibrillation, CHA2DS2-VASc score of 5 and unadjusted ischemic stroke rate of 7.2% per year. Unfortunately, the patient is not felt to be a long term anticoagulation candidate secondary to bleeding, both GI and hematuria. The patient's chart has been reviewed and I feel that they would be a candidate for short term oral anticoagulation after Watchman implant.   It is my belief that after undergoing a LAA closure procedure, [name] will not need long term anticoagulation which eliminates anticoagulation side effects and major bleeding risk.   Procedural risks for the Watchman implant have been reviewed with the patient including a 0.5% risk of stroke, <1% risk of perforation and <1% risk of device embolization. Other risks include bleeding, vascular damage, tamponade, worsening renal function, and death. The patient understands these risk and wishes to proceed.     The published clinical data on the safety and effectiveness of WATCHMAN include but are not limited to the following: - [name], [name], [name] et al. for the [name]. Percutaneous closure of the left atrial appendage versus warfarin therapy for prevention of stroke in patients with atrial fibrillation: a randomised non-inferiority trial. Lancet 2009; 374: 534-42. [name], [name], [name] et al. on behalf of the [name]. Percutaneous Left Atrial Appendage Closure for Stroke Prophylaxis  in Patients With Atrial Fibrillation 2.3-Year Follow-up of the Julian AF (Watchman Left Atrial Appendage System for Embolic Protection in Patients With Atrial Fibrillation) Trial. Circulation 2013; 127:720-729. - Alli O, Julian S,  Kar S, Reddy VY, Sievert H et al. Quality of Life Assessment in the Randomized Julian AF (Percutaneous Closure of the Left Atrial Appendage Versus Warfarin Therapy for Prevention of Stroke in Patients With Atrial Fibrillation) Trial of Patients at Risk for Stroke With Nonvalvular Atrial Fibrillation. J Am Coll Cardiol 2013; 61:1790-8. Bartholome Ligas [name], Mario Sicard, Price M, Whisenant B, Sievert H, Julian S, Huber K, Reddy V. Prospective randomized evaluation of the Watchman left atrial appendage Device in patients with atrial fibrillation versus long-term warfarin therapy; the PREVAIL trial. Journal of the [employer] of Cardiology, Vol. 4, No. 1, 2014, 1-11. - Kar S, [name], Sadhu A, Horton R, Osorio J et al. Primary outcome evaluation of a next-generation left atrial appendage closure device: results from the PINNACLE FLX trial. Circulation 2021;143(18)1754-1762.   HAS-BLED score  4 Hypertension Yes  Abnormal renal and liver function (Dialysis, transplant, Cr >2.26 mg/dL /Cirrhosis or Bilirubin >2x Normal or AST/ALT/AP >3x Normal) No  Stroke No  Bleeding Yes  Labile INR (Unstable/high INR) No  Elderly (>65) Yes  Drugs or alcohol (>= 8 drinks/week, anti-plt or NSAID) Yes   CHA2DS2-VASc Score = 5  The patient's score is based upon: CHF History: 1 HTN History: 1 Diabetes History: 0 Stroke History: 0 Vascular Disease History: 1 Age Score: 2 Gender Score: 0       ASSESSMENT AND PLAN: Patient is higher than average risk for Watchman device implant due to his age and comorbidities.  We discussed risk and benefits of oral anticoagulation for stroke prophylaxis extensively.  Strategies include Watchman device, retrialing reduced dose of anticoagulation, and continuing off  anticoagulation.  Unfortunately, patient has progressed to permanent atrial fibrillation which places him at elevated risk of stroke off anticoagulation.  He has a CHA2DS2-VASc score of at least 5 which corresponds to estimated annual stroke risk of at least 7.2%.  He has been unable to tolerate oral anticoagulation due to history of melena, hematuria, falls.  He would need clearance from his urologist to be on anticoagulation short-term if Watchman device was to be pursued. After today's visit with the patient which was dedicated solely for shared decision making visit regarding LAA closure device, the patient would like to think about his options.  If he decides to pursue watchman implant, then I would like to obtain a gated CT scan of the chest with contrast timed for PV/LA visualization.   Permanent Atrial Fibrillation: Asymptomatic from his AF.  Secondary Hypercoagulable State: The patient is at significant risk for stroke/thromboembolism based upon his CHA2DS2-VASc Score of 5.  If patient decided to proceed with watchman, then best strategy may be to initiate DAPT or reduced dose of oral anticoagulation 4 weeks prior to procedure and continue for least 45 days after.  Signed, Julian Kohler, MD

## 2024-02-24 NOTE — Patient Instructions (Signed)
 Medication Instructions:  Your physician recommends that you continue on your current medications as directed. Please refer to the Current Medication list given to you today.  *If you need a refill on your cardiac medications before your next appointment, please call your pharmacy*  Follow-Up: At [facility], you and your health needs are our priority.  As part of our continuing mission to provide you with exceptional heart care, our providers are all part of one team.  This team includes your primary Cardiologist (physician) and Advanced Practice Providers or APPs (Physician Assistants and Nurse Practitioners) who all work together to provide you with the care you need, when you need it.  Your next appointment:   As needed with Dr. Daneil Dunker

## 2024-02-28 DIAGNOSIS — I4821 Permanent atrial fibrillation: Secondary | ICD-10-CM | POA: Insufficient documentation

## 2024-02-28 DIAGNOSIS — N400 Enlarged prostate without lower urinary tract symptoms: Secondary | ICD-10-CM | POA: Insufficient documentation

## 2024-02-28 NOTE — Progress Notes (Unsigned)
 [name] is a 88 y.o. male who presents for annual wellness visit and follow-up on chronic medical conditions.  He has the following concerns:   Immunizations and Health Maintenance Immunization History  Administered Date(s) Administered   DTaP 02/19/2000   Fluad Quad(high Dose 65+) 06/12/2020, 08/10/2022   Influenza Split 08/03/2002, 09/22/2006, 07/09/2008, 07/26/2012   Influenza Whole 07/15/2009   Influenza, High Dose Seasonal PF 08/17/2013, 05/29/2015, 10/19/2016, 07/20/2017, 07/20/2018, 06/19/2019, 06/05/2021   PFIZER Comirnaty(Gray Top)Covid-19 Tri-Sucrose Vaccine 09/08/2020   PFIZER(Purple Top)SARS-COV-2 Vaccination 11/09/2019, 12/04/2019   PNEUMOCOCCAL CONJUGATE-20 06/05/2021   Pfizer Covid-19 Vaccine Bivalent Booster 30yrs & up 10/27/2021   Pfizer(Comirnaty)Fall Seasonal Vaccine 12 years and older 08/10/2022   Pneumococcal Conjugate-13 03/01/2017   Pneumococcal Polysaccharide-23 01/19/2007, 06/04/2014   Tdap 01/19/2007, 08/19/2021   Zoster Recombinant(Shingrix) 06/05/2021, 08/19/2021   Health Maintenance Due  Topic Date Due   Colonoscopy  09/21/2017   FOOT EXAM  07/20/2018   HEMOGLOBIN A1C  05/03/2023   Medicare Annual Wellness (AWV)  11/03/2023    Last colonoscopy: 2013 Last PSA: Dentist: Ophtho: Exercise:  Other doctors caring for patient include:  Advanced Directives:    Depression screen:  See questionnaire below.        11/02/2022   10:34 AM 10/27/2021   11:47 AM 10/22/2020    9:48 AM 06/12/2020   10:07 AM 07/20/2018    2:58 PM  Depression screen PHQ 2/9  Decreased Interest 0 0 0 0 0  Down, Depressed, Hopeless 0 0 0 0 0  PHQ - 2 Score 0 0 0 0 0    Fall Screen: See Questionaire below.      11/02/2022   10:34 AM 10/27/2021   11:46 AM 10/22/2020    9:48 AM 08/29/2019   11:46 AM 07/20/2018    2:58 PM  Fall Risk   Falls in the past year? 1 1 1 1  No  Comment    Emmi Telephone Survey: data to providers prior to load   Number falls in past yr: 1  1 1 1    Comment    Emmi Telephone Survey Actual Response = 1   Injury with Fall? 0 0 0 0   Risk for fall due to : History of fall(s) History of fall(s) Impaired balance/gait    Follow up Falls evaluation completed;Falls prevention discussed Falls evaluation completed       ADL screen:  See questionnaire below.  Functional Status Survey:     Review of Systems  Constitutional: -, -unexpected weight change, -anorexia, -fatigue Allergy: -sneezing, -itching, -congestion Dermatology: denies changing moles, rash, lumps ENT: -runny nose, -ear pain, -sore throat,  Cardiology:  -chest pain, -palpitations, -orthopnea, Respiratory: -cough, -shortness of breath, -dyspnea on exertion, -wheezing,  Gastroenterology: -abdominal pain, -nausea, -vomiting, -diarrhea, -constipation, -dysphagia Hematology: -bleeding or bruising problems Musculoskeletal: -arthralgias, -myalgias, -joint swelling, -back pain, - Ophthalmology: -vision changes,  Urology: -dysuria, -difficulty urinating,  -urinary frequency, -urgency, incontinence Neurology: -, -numbness, , -memory loss, -falls, -dizziness    PHYSICAL EXAM:  There were no vitals taken for this visit.  General Appearance: Alert, cooperative, no distress, appears stated age Head: Normocephalic, without obvious abnormality, atraumatic Eyes: PERRL, conjunctiva/corneas clear, EOM's intact, fundi benign Ears: Normal TM's and external ear canals Nose: Nares normal, mucosa normal, no drainage or sinus   tenderness Throat: Lips, mucosa, and tongue normal; teeth and gums normal Neck: Supple, no lymphadenopathy, thyroid:no enlargement/tenderness/nodules; no carotid bruit or JVD Lungs: Clear to auscultation bilaterally without wheezes, rales or ronchi; respirations unlabored Heart:  Regular rate and rhythm, S1 and S2 normal, no murmur, rub or gallop Abdomen: Soft, non-tender, nondistended, normoactive bowel sounds, no masses, no hepatosplenomegaly Extremities: No  clubbing, cyanosis or edema Pulses: 2+ and symmetric all extremities Skin: Skin color, texture, turgor normal, no rashes or lesions Lymph nodes: Cervical, supraclavicular, and axillary nodes normal Neurologic: CNII-XII intact, normal strength, sensation and gait; reflexes 2+ and symmetric throughout   Psych: Normal mood, affect, hygiene and grooming  ASSESSMENT/PLAN: Status post endovascular aneurysm repair (EVAR)  Type 2 diabetes mellitus in remission (HCC)  Urge incontinence  Mild aortic stenosis by prior echocardiogram  Obesity (BMI 30-39.9)  History of ST elevation myocardial infarction (STEMI) of inferolateral wall (PTCA - 100% large lateral OM)  Hyperlipidemia LDL goal <70; statin intolerant  Hypertensive heart disease with chronic diastolic congestive heart failure (HCC)  Coronary stent restenosis with uncertain cause, initial encounter  CAD S/P CABG '95- several PCis since, last 05/17/14  Benign prostatic hyperplasia, unspecified whether lower urinary tract symptoms present  Permanent atrial fibrillation (HCC)    Discussed PSA screening (risks/benefits), recommended at least 30 minutes of aerobic activity at least 5 days/week; proper sunscreen use reviewed; healthy diet and alcohol recommendations (less than or equal to 2 drinks/day) reviewed; regular seatbelt use; changing batteries in smoke detectors. Immunization recommendations discussed.  Colonoscopy recommendations reviewed.   Medicare Attestation I have personally reviewed: The patient's medical and social history Their use of alcohol, tobacco or illicit drugs Their current medications and supplements The patient's functional ability including ADLs,fall risks, home safety risks, cognitive, and hearing and visual impairment Diet and physical activities Evidence for depression or mood disorders  The patient's weight, height, and BMI have been recorded in the chart.  I have made referrals, counseling, and  provided education to the patient based on review of the above and I have provided the patient with a written personalized care plan for preventive services.     Ron Cobbs, MD   02/28/2024

## 2024-02-29 ENCOUNTER — Encounter: Payer: Self-pay | Admitting: Family Medicine

## 2024-02-29 ENCOUNTER — Other Ambulatory Visit: Payer: Self-pay | Admitting: Cardiology

## 2024-02-29 ENCOUNTER — Ambulatory Visit: Payer: Medicare Other | Admitting: Family Medicine

## 2024-02-29 VITALS — BP 128/58 | HR 64 | Ht 69.5 in | Wt 196.8 lb

## 2024-02-29 DIAGNOSIS — I4821 Permanent atrial fibrillation: Secondary | ICD-10-CM

## 2024-02-29 DIAGNOSIS — E669 Obesity, unspecified: Secondary | ICD-10-CM | POA: Diagnosis not present

## 2024-02-29 DIAGNOSIS — G720 Drug-induced myopathy: Secondary | ICD-10-CM

## 2024-02-29 DIAGNOSIS — R296 Repeated falls: Secondary | ICD-10-CM

## 2024-02-29 DIAGNOSIS — Z0000 Encounter for general adult medical examination without abnormal findings: Secondary | ICD-10-CM

## 2024-02-29 DIAGNOSIS — I350 Nonrheumatic aortic (valve) stenosis: Secondary | ICD-10-CM

## 2024-02-29 DIAGNOSIS — R2689 Other abnormalities of gait and mobility: Secondary | ICD-10-CM

## 2024-02-29 DIAGNOSIS — N400 Enlarged prostate without lower urinary tract symptoms: Secondary | ICD-10-CM

## 2024-02-29 DIAGNOSIS — E785 Hyperlipidemia, unspecified: Secondary | ICD-10-CM

## 2024-02-29 DIAGNOSIS — I2119 ST elevation (STEMI) myocardial infarction involving other coronary artery of inferior wall: Secondary | ICD-10-CM | POA: Diagnosis not present

## 2024-02-29 DIAGNOSIS — E119 Type 2 diabetes mellitus without complications: Secondary | ICD-10-CM | POA: Diagnosis not present

## 2024-02-29 DIAGNOSIS — I110 Hypertensive heart disease with heart failure: Secondary | ICD-10-CM

## 2024-02-29 DIAGNOSIS — T82855A Stenosis of coronary artery stent, initial encounter: Secondary | ICD-10-CM

## 2024-02-29 DIAGNOSIS — N3941 Urge incontinence: Secondary | ICD-10-CM

## 2024-02-29 DIAGNOSIS — Z8679 Personal history of other diseases of the circulatory system: Secondary | ICD-10-CM

## 2024-02-29 DIAGNOSIS — I25119 Atherosclerotic heart disease of native coronary artery with unspecified angina pectoris: Secondary | ICD-10-CM

## 2024-02-29 DIAGNOSIS — Z96653 Presence of artificial knee joint, bilateral: Secondary | ICD-10-CM

## 2024-02-29 DIAGNOSIS — I719 Aortic aneurysm of unspecified site, without rupture: Secondary | ICD-10-CM

## 2024-02-29 LAB — POCT GLYCOSYLATED HEMOGLOBIN (HGB A1C): Hemoglobin A1C: 5.7 % — AB (ref 4.0–5.6)

## 2024-02-29 LAB — LIPID PANEL

## 2024-02-29 NOTE — Progress Notes (Signed)
 [name] is a 88 y.o. male who presents for annual wellness visit and follow-up on chronic medical conditions.  He has the following concerns: He continues have difficulty with balance but is unwilling to use a walker as he should.  He also has had difficulty with hematuria and has seen urology.  The port was reevaluated and he did have irritated prostate.  He is also seeing cardiology and evaluated for a Watchman procedure but there is concern over his overall health and not tolerating that procedure well.  He does have underlying atrial fibs.  He is not taking any DOAC is he has had increased difficulty with falls.  Also he is not taking his asthma medications.  States at this point he does not really need it.  He did recently fall and injured his right Broadus joint.  He is scheduled for follow-up in September for his AAA.   Immunizations and Health Maintenance Immunization History  Administered Date(s) Administered   DTaP 02/19/2000   Fluad Quad(high Dose 65+) 06/12/2020, 08/10/2022   Influenza Split 08/03/2002, 09/22/2006, 07/09/2008, 07/26/2012   Influenza Whole 07/15/2009   Influenza, High Dose Seasonal PF 08/17/2013, 05/29/2015, 10/19/2016, 07/20/2017, 07/20/2018, 06/19/2019, 06/05/2021   PFIZER Comirnaty(Gray Top)Covid-19 Tri-Sucrose Vaccine 09/08/2020   PFIZER(Purple Top)SARS-COV-2 Vaccination 11/09/2019, 12/04/2019   PNEUMOCOCCAL CONJUGATE-20 06/05/2021   Pfizer Covid-19 Vaccine Bivalent Booster 22yrs & up 10/27/2021   Pfizer(Comirnaty)Fall Seasonal Vaccine 12 years and older 08/10/2022   Pneumococcal Conjugate-13 03/01/2017   Pneumococcal Polysaccharide-23 01/19/2007, 06/04/2014   Tdap 01/19/2007, 08/19/2021   Zoster Recombinant(Shingrix) 06/05/2021, 08/19/2021   Health Maintenance Due  Topic Date Due   Colonoscopy  09/21/2017   FOOT EXAM  07/20/2018    Last colonoscopy: 2013 Last PSA: 11/14/23  Dentist: every 6 months, [address] Dr. Micael Adas Ophtho: Dr. Reynold Caves Dr. Lasandra Points  in August Exercise: Nope  Other doctors caring for patient include: Dr. Del Favia [facility] in [place], Dr. Alva Jewels Dermatology Dr Addie Holstein  Advanced Directives: Does Patient Have a Medical Advance Directive?: Yes Type of Advance Directive: Healthcare Power of Attorney, Living will Does patient want to make changes to medical advance directive?: Yes (ED - Information included in AVS) Copy of Healthcare Power of Attorney in Chart?: Yes - validated most recent copy scanned in chart (See row information)  Depression screen:  See questionnaire below.        02/29/2024    2:25 PM 11/02/2022   10:34 AM 10/27/2021   11:47 AM 10/22/2020    9:48 AM 06/12/2020   10:07 AM  Depression screen PHQ 2/9  Decreased Interest 0 0 0 0 0  Down, Depressed, Hopeless 0 0 0 0 0  PHQ - 2 Score 0 0 0 0 0    Fall Screen: See Questionaire below.      02/29/2024    2:10 PM 11/02/2022   10:34 AM 10/27/2021   11:46 AM 10/22/2020    9:48 AM 08/29/2019   11:46 AM  Fall Risk   Falls in the past year? 1 1 1 1 1   Comment     Emmi Telephone Survey: data to providers prior to load  Number falls in past yr: 1 1 1 1 1   Comment 10-12 falls    Emmi Telephone Survey Actual Response = 1  Injury with Fall? 1 0 0 0 0  Comment might have cracked his collar bone,      Risk for fall due to : Impaired balance/gait History of fall(s) History of fall(s) Impaired balance/gait  Follow up Falls evaluation completed Falls evaluation completed;Falls prevention discussed Falls evaluation completed      ADL screen:  See questionnaire below.  Functional Status Survey: Is the patient deaf or have difficulty hearing?: Yes Does the patient have difficulty seeing, even when wearing glasses/contacts?: No Does the patient have difficulty concentrating, remembering, or making decisions?: Yes Does the patient have difficulty walking or climbing stairs?: Yes Does the patient have difficulty dressing or bathing?: Yes Does the patient have  difficulty doing errands alone such as visiting a doctor's office or shopping?: Yes   Review of Systems  Constitutional: -, -unexpected weight change, -anorexia, -fatigue Allergy: -sneezing, -itching, -congestion Dermatology: denies changing moles, rash, lumps ENT: -runny nose, -ear pain, -sore throat,  Cardiology:  -chest pain, -palpitations, -orthopnea, Respiratory: -cough, -shortness of breath, -dyspnea on exertion, -wheezing,  Gastroenterology: -abdominal pain, -nausea, -vomiting, -diarrhea, -constipation, -dysphagia  Musculoskeletal: -arthralgias, -myalgias, -joint swelling, -back pain, - Ophthalmology: -vision changes,  Neurology: -, -numbness, , -memory loss, -falls, -dizziness    PHYSICAL EXAM:  BP (!) 128/58   Pulse 64   Ht 5' 9.5" (1.765 m)   Wt 196 lb 12.8 oz (89.3 kg)   BMI 28.65 kg/m   General Appearance: Alert, cooperative, no distress, appears stated age Head: Normocephalic, without obvious abnormality, atraumatic Eyes: PERRL, conjunctiva/corneas clear, EOM's intact, Ears: Normal TM's and external ear canals Nose: Nares normal, mucosa normal, no drainage or sinus   tenderness Throat: Lips, mucosa, and tongue normal; teeth and gums normal Neck: Supple, no lymphadenopathy, thyroid:no enlargement/tenderness/nodules; no carotid bruit or JVD Lungs: Clear to auscultation bilaterally without wheezes, rales or ronchi; respirations unlabored Heart: Regular rate and rhythm, S1 and S2 normal, no murmur, rub or gallop Abdomen: Soft, non-tender, nondistended, normoactive bowel sounds, no masses, no hepatosplenomegaly Skin: Skin color, texture, turgor normal, no rashes or lesions Lymph nodes: Cervical, supraclavicular, and axillary nodes normal Neurologic: CNII-XII intact, normal strength, sensation and gait; reflexes 2+ and symmetric throughout   Psych: Normal mood, affect, hygiene and grooming  ASSESSMENT/PLAN: Routine general medical examination at a health care  facility  Status post endovascular aneurysm repair (EVAR) - Plan: CBC with Differential/Platelet, Comprehensive metabolic panel with GFR, Lipid panel  Type 2 diabetes mellitus in remission (HCC) - Plan: POCT glycosylated hemoglobin (Hb A1C)  Urge incontinence  Mild aortic stenosis by prior echocardiogram  Obesity (BMI 30-39.9)  History of ST elevation myocardial infarction (STEMI) of inferolateral wall (PTCA - 100% large lateral OM)  Hyperlipidemia LDL goal <70; statin intolerant - Plan: Lipid panel  Hypertensive heart disease with chronic diastolic congestive heart failure (HCC) - Plan: CBC with Differential/Platelet, Comprehensive metabolic panel with GFR  Coronary stent restenosis with uncertain cause, initial encounter  CAD S/P CABG '95- several PCis since, last 05/17/14  Benign prostatic hyperplasia, unspecified whether lower urinary tract symptoms present  Permanent atrial fibrillation (HCC)  Multiple falls  Balance problem  Statin myopathy  Status post total bilateral knee replacement  Aortic aneurysm without rupture, unspecified portion of aorta (HCC)    Discussed the fact that at this point we cannot give him a blood thinner due to increased risk of fall as well as prostate issues.  Basically continue on present medication regimen and follow-up with urology, cardiology.  He is not taking his asthma medications at the present time.  Instructed him to call me if he starts running in a difficulty with his breathing.  Medicare Attestation I have personally reviewed: The patient's medical and social history Their use of  alcohol, tobacco or illicit drugs Their current medications and supplements The patient's functional ability including ADLs,fall risks, home safety risks, cognitive, and hearing and visual impairment Diet and physical activities Evidence for depression or mood disorders  The patient's weight, height, and BMI have been recorded in the chart.  I have  made referrals, counseling, and provided education to the patient based on review of the above and I have provided the patient with a written personalized care plan for preventive services.     Ron Cobbs, MD   02/29/2024

## 2024-03-01 ENCOUNTER — Encounter: Payer: Self-pay | Admitting: Urology

## 2024-03-01 ENCOUNTER — Ambulatory Visit: Payer: Self-pay | Admitting: Family Medicine

## 2024-03-01 ENCOUNTER — Ambulatory Visit: Admitting: Urology

## 2024-03-01 VITALS — BP 156/72 | HR 85 | Ht 69.5 in | Wt 197.0 lb

## 2024-03-01 DIAGNOSIS — R310 Gross hematuria: Secondary | ICD-10-CM

## 2024-03-01 DIAGNOSIS — N401 Enlarged prostate with lower urinary tract symptoms: Secondary | ICD-10-CM

## 2024-03-01 LAB — LIPID PANEL
Cholesterol, Total: 160 mg/dL (ref 100–199)
HDL: 36 mg/dL — ABNORMAL LOW (ref >39)
LDL CALC COMMENT:: 4.4 ratio (ref 0.0–5.0)
LDL Chol Calc (NIH): 106 mg/dL — ABNORMAL HIGH (ref 0–99)
Triglycerides: 99 mg/dL (ref 0–149)
VLDL Cholesterol Cal: 18 mg/dL (ref 5–40)

## 2024-03-01 LAB — URINALYSIS, ROUTINE W REFLEX MICROSCOPIC
Bilirubin, UA: NEGATIVE
Glucose, UA: NEGATIVE
Ketones, UA: NEGATIVE
Leukocytes,UA: NEGATIVE
Nitrite, UA: NEGATIVE
Protein,UA: NEGATIVE
RBC, UA: NEGATIVE
Specific Gravity, UA: 1.015 (ref 1.005–1.030)
Urobilinogen, Ur: 0.2 mg/dL (ref 0.2–1.0)
pH, UA: 5.5 (ref 5.0–7.5)

## 2024-03-01 LAB — COMPREHENSIVE METABOLIC PANEL WITH GFR
ALT: 14 IU/L (ref 0–44)
AST: 24 IU/L (ref 0–40)
Albumin: 3.9 g/dL (ref 3.7–4.7)
Alkaline Phosphatase: 41 IU/L — ABNORMAL LOW (ref 44–121)
BUN/Creatinine Ratio: 15 (ref 10–24)
BUN: 21 mg/dL (ref 8–27)
Bilirubin Total: 0.7 mg/dL (ref 0.0–1.2)
CO2: 20 mmol/L (ref 20–29)
Calcium: 9.7 mg/dL (ref 8.6–10.2)
Chloride: 106 mmol/L (ref 96–106)
Creatinine, Ser: 1.37 mg/dL — ABNORMAL HIGH (ref 0.76–1.27)
Globulin, Total: 2.4 g/dL (ref 1.5–4.5)
Glucose: 161 mg/dL — ABNORMAL HIGH (ref 70–99)
Potassium: 4.2 mmol/L (ref 3.5–5.2)
Sodium: 141 mmol/L (ref 134–144)
Total Protein: 6.3 g/dL (ref 6.0–8.5)
eGFR: 50 mL/min/{1.73_m2} — ABNORMAL LOW (ref >59)

## 2024-03-01 LAB — CBC WITH DIFFERENTIAL/PLATELET
Basophils Absolute: 0 10*3/uL (ref 0.0–0.2)
Basos: 1 %
EOS (ABSOLUTE): 0 10*3/uL (ref 0.0–0.4)
Eos: 0 %
Hematocrit: 35.2 % — ABNORMAL LOW (ref 37.5–51.0)
Hemoglobin: 11.2 g/dL — ABNORMAL LOW (ref 13.0–17.7)
Immature Grans (Abs): 0 10*3/uL (ref 0.0–0.1)
Immature Granulocytes: 0 %
Lymphocytes Absolute: 1 10*3/uL (ref 0.7–3.1)
Lymphs: 20 %
MCH: 31.5 pg (ref 26.6–33.0)
MCHC: 31.8 g/dL (ref 31.5–35.7)
MCV: 99 fL — ABNORMAL HIGH (ref 79–97)
Monocytes Absolute: 0.3 10*3/uL (ref 0.1–0.9)
Monocytes: 7 %
Neutrophils Absolute: 3.7 10*3/uL (ref 1.4–7.0)
Neutrophils: 72 %
Platelets: 210 10*3/uL (ref 150–450)
RBC: 3.55 x10E6/uL — ABNORMAL LOW (ref 4.14–5.80)
RDW: 13.3 % (ref 11.6–15.4)
WBC: 5.1 10*3/uL (ref 3.4–10.8)

## 2024-03-01 NOTE — Progress Notes (Signed)
 Assessment: 1. Gross hematuria   2. Benign localized prostatic hyperplasia with lower urinary tract symptoms (LUTS)     Plan: I reviewed the prior notes from Dr. Del Favia. Continue finasteride  5 mg daily.  I think it be reasonable to consider starting aspirin .  Hopefully he will not have recurrence of his hematuria with this. Return to office in 6 months.  Chief Complaint: Chief Complaint  Patient presents with   Hematuria    HPI: [name] is a 88 y.o. male who presents for continued evaluation of gross hematuria.    He was initially seen by Dr. Del Favia on 11/09/23 with history as follows: 88 YO male with a past medical of diastolic CHF, asthma, GERD, CAD, STEMI, CABG, prior smoker, diabetes  who is seen in consultation from Watson Hacking, MD for evaluation of gross hematuria.  This began about 1 month ago and is present most days.  Was on Eliquis  which was recently discontinued not only because of the hematuria but because of balance issues and frequent falls.  He is taking Plavix  currently. Patient also has significant CKD with his creatinines varying from 1.45-2.09 over the past 2 years. CT stone study 06/2023 showed an atrophic right kidney otherwise negative from a GU standpoint. Patient could not void during visit today.  Bladder scan 289 mL Note is made that the patient held his Plavix  and his urine entirely cleared.   Renal ultrasound which showed a solid-appearing lesion in the left posterior bladder.  Cystoscopy from 11/15/23: Flexible cystoscopy revealed a normal anterior urethra.  The patient has a large prostate with a long friable prostatic urethra which bleeds easily with passage of scope especially near the bladder neck.  There is also some intravesical extension of the prostate.   The bladder was subsequently entered and carefully inspected.  This revealed severe bladder trabeculation with large cellule formation.  There was also a dependent clot in the left bladder  floor which would move around quite easily with irrigation.  There were no focal mucosal lesions appreciated.  The ureteral openings were not definitely visualized due to severe trabeculation.  Urine cytology was negative for malignancy.  He was started on finasteride  in February 2025.  He returns today for follow-up.  Continues on finasteride .  He has not had any further episodes of gross hematuria.  His urinary symptoms are relatively stable.  He does have some frequency and urgency.  No dysuria or flank pain. IPSS = 8/2.  Portions of the above documentation were copied from a prior visit for review purposes only.  Allergies: Allergies  Allergen Reactions   Oxycodone  Shortness Of Breath and Cough   Lisinopril Cough   Statins Other (See Comments)    MYALGIAS    Welchol [Colesevelam Hcl] Itching    PMH: Past Medical History:  Diagnosis Date   Adenomatous colon polyp    Ankle edema    Chronic   Asthma    CAD S/P percutaneous coronary angioplasty 2004   a) '04: Staged Taxus DES PCI to RCA (2 Taxus 2.75 x 32 & 12) and Cx-OM (Taxus 3 x 20);;'07 - PCI to pRCA ISR- Cypher DES; c) 05/2014: pPCI pRCA stentISR (Promus DES 3 x 8) OM1 distal to stent (Promus DES 2.75 x 12 - 3.1); 11/'22: distal LCx stent edge 99% -> Onyx Frontier DES 2.5 x 15 - 2.8 (overlaps prior stent)   CAD, multiple vessel 1985   Most recent July 2018: Admitted for non-STEMI. Occluded LAD with patent  LIMA (SP1 now occluded).  Patent stents in proximal and mid RCA as well as circumflex-OM 3. Known occlusion of SVG-OM. EF 45-50%   CHF (congestive heart failure) (HCC)    Cholelithiasis - with cholangitis & choledocholithiasis    status post ERCP with removal of calculi and biliary stent placement.   Chronic anemia    On iron supplement; history of positive guaiac - negative colonoscopy in 1996.; Thought to be related to hemorrhoids; status post hemorrhoidectomy   Chronic back pain     multiple surgeries; C-spine and lumbar    Diabetes mellitus    no meds- pt states he is no longer DM   Diverticulitis of colon 1996   Diverticulosis    Dyslipidemia, goal LDL below 70    Erectile dysfunction    Exertional dyspnea    Chronic baseline SOB with ambulation   GERD (gastroesophageal reflux disease)    Grade II diastolic dysfunction    H/O: pneumonia 11/2012   Hemorrhoids    Hiatal hernia    History of: ST elevation myocardial infarction (STEMI) involving left circumflex coronary artery with complication 1985   PTCA-circumflex; PCI in 1991   Hypertension    Moderate aortic stenosis by prior echocardiogram 07/2015   Normal LV function - EF 55-60%.. Abnormal relaxation. Mild-moderate aortic stenosis (peak/mean gradient 18/10 mmH)   Osteoarthritis of both knees    And back; multiple back surgeries, right knee arthroplasty and left knee arthroscopic surgery x2   Pneumonia 2016   S/P AAA (abdominal aortic aneurysm) repair 08/30/2012   s/p EVAR   S/P CABG x 2 1997   LIMA-LAD, SVG-OM   Sleep apnea    pt. states he was told to return for f/u, to be fitted for Cpap, but pt. reports that he didn't follow up-no cpap used   Statin myopathy 09/30/2015    PSH: Past Surgical History:  Procedure Laterality Date   Abdominal and Lower Extremity Arterial Ultrasound  08/23/2012; 10/10/2013   Normal ABIs. Nonocclusive lower extremity disease. 4.2 cm x 4.3 cm infrarenal AAA;; 4.4 cm x 4.3 cm (essentially stable)    ABDOMINAL AORTIC ENDOVASCULAR STENT GRAFT N/A 08/13/2015   Procedure: ABDOMINAL AORTIC ENDOVASCULAR STENT GRAFT;  Surgeon: Palma Bob, MD;  Location[facility] OR;  Service: Vascular;  Laterality: N/A;   Anterior cervical plating  04/23/2010   At C4-5 and a C6-7 utilizing two separate Biomet MaxAn plates.   ANTERIOR LAT LUMBAR FUSION Left 11/22/2012   Procedure: ANTERIOR LATERAL LUMBAR FUSION 1 LEVEL;  Surgeon: Isadora Mar, MD;  Location: [facility] NEURO ORS;  Service: Neurosurgery;  Laterality: Left;  Anterior Lateral  Lumbar Fusion Lumbar Three-Four   BACK SURGERY  1979 & x 10   pt. remarks, "I have had about 10 back surgeries"   BILIARY STENT PLACEMENT N/A 07/03/2014   Procedure: BILIARY STENT PLACEMENT;  Surgeon: Claudette Cue, MD;  Location: [facility] ENDOSCOPY;  Service: Endoscopy;  Laterality: N/A;   CARPAL TUNNEL RELEASE Left 11/16/2019   Procedure: LEFT CARPAL TUNNEL RELEASE;  Surgeon: Arnie Lao, MD;  Location: [facility] ORS;  Service: Orthopedics;  Laterality: Left;   CARPAL TUNNEL RELEASE Right 01/10/2023   Procedure: RIGHT CARPAL TUNNEL RELEASE;  Surgeon: Isadora Mar, MD;  Location: [facility] OR;  Service: Neurosurgery;  Laterality: Right;   CATARACT EXTRACTION     Cervical arthrodesis  04/23/2010   Anterior cervical arthrodesis, C4-5, C6-7 utilizing 7-mm PEEK interbody cage packed with local autograft & Antifuse putty at C4-5 & an 8-mm  cage at C6-7.   CERVICAL DISCECTOMY  04/23/2010   Decompressive anterior carvical diskectomy. C4-5, C6-7   CHOLECYSTECTOMY N/A 05/23/2015   Procedure: LAPAROSCOPIC CHOLECYSTECTOMY WITH INTRAOPERATIVE CHOLANGIOGRAM;  Surgeon: Juanita Norlander, MD;  Location: [facility] ORS;  Service: General;  Laterality: N/A;   COLONOSCOPY  1996   CORONARY ANGIOPLASTY  1985,1991,15   1985 lateral STEMI Circumflex PTCA;    CORONARY ARTERY BYPASS GRAFT  1997   LIMA-LAD, SVG-OM (SVG known to be occluded prior to 2004)   CORONARY STENT INTERVENTION N/A 08/20/2021   Procedure: CORONARY STENT INTERVENTION;  Surgeon: Avanell Leigh, MD;  Location: [facility] INVASIVE CV LAB;  Service: Cardiovascular:  dLCx distal Edge 99% => Onyx Frontier DES 2.5 x 15 (2.8 mm) overlaps prior stent distally.   ERCP N/A 07/03/2014   Procedure: ENDOSCOPIC RETROGRADE CHOLANGIOPANCREATOGRAPHY (ERCP);  Surgeon: Claudette Cue, MD;  Location: [facility] ENDOSCOPY;  Service: Endoscopy;  Laterality: N/A;   ERCP N/A 07/05/2014   Procedure: ENDOSCOPIC RETROGRADE CHOLANGIOPANCREATOGRAPHY (ERCP);  Surgeon: Claudette Cue, MD;  Location: [facility]  ENDOSCOPY;  Service: Endoscopy;  Laterality: N/A;   ERCP N/A 06/30/2015   Procedure: ENDOSCOPIC RETROGRADE CHOLANGIOPANCREATOGRAPHY (ERCP);  Surgeon: Asencion Blacksmith, MD;  Location: [facility] ENDOSCOPY;  Service: Endoscopy;  Laterality: N/A;   EYE SURGERY     IR ANGIOGRAM EXTREMITY LEFT  01/18/2017   IR ANGIOGRAM PELVIS SELECTIVE OR SUPRASELECTIVE  01/18/2017   IR ANGIOGRAM SELECTIVE EACH ADDITIONAL VESSEL  01/18/2017   IR AORTAGRAM ABDOMINAL SERIALOGRAM  01/18/2017   IR EMBO ARTERIAL NOT HEMORR HEMANG INC GUIDE ROADMAPPING  01/18/2017   IR RADIOLOGIST EVAL & MGMT  01/04/2017   IR RADIOLOGIST EVAL & MGMT  10/26/2017   IR RADIOLOGIST EVAL & MGMT  05/24/2018   IR RADIOLOGIST EVAL & MGMT  06/19/2019   IR RADIOLOGIST EVAL & MGMT  06/11/2020   IR RADIOLOGIST EVAL & MGMT  07/02/2021   IR RADIOLOGIST EVAL & MGMT  06/22/2022   IR RADIOLOGIST EVAL & MGMT  07/08/2023   IR US  GUIDE VASC ACCESS RIGHT  01/18/2017   KNEE ARTHROSCOPY Left    x 2   LEFT HEART CATH AND CORS/GRAFTS ANGIOGRAPHY  06/2003   None Occluded vein graft to OM; diffuse RCA disease in the mid vessel, 80% circumflex-OM stenosis; follow on AV groove circumflex with sequential 90% stenoses and intervening saccular dilation    LEFT HEART CATH AND CORS/GRAFTS ANGIOGRAPHY  12/2008   4 abnormal Myoview  showing apical thinning (possibly due to apical LAD 95%) : 100% Occluded LAD after SV1, distal LAD grafted via LIMA -apical 95% . Cx -OM1 w/patent stent extending into OM 1 . Follow on Cx - 70-80% - non-amenable PCI. RCA widely patent 3 overlapping stents in mRCA w/less than 40% stenosisin RPL; SVG-OM known occluded    LEFT HEART CATH AND CORS/GRAFTS ANGIOGRAPHY N/A 04/20/2017   Procedure: Left Heart Cath and Cors/Grafts Angiography;  Surgeon: Avanell Leigh, MD;  Location: [facility] INVASIVE CV LAB;  LAD now occluded prior to SP1.LIMA-LAD. Patent RCA and circumflex stents. EF 45-50%. Relatively stable.    LEFT HEART CATH AND CORS/GRAFTS ANGIOGRAPHY N/A  08/20/2021   Procedure: LEFT HEART CATH AND CORS/GRAFTS ANGIOGRAPHY;  Surgeon: Avanell Leigh, MD;  Location: [facility] INVASIVE CV LAB;  Service: Cardiovascular: CULPRIT 99% dLCx just after stent (Overlapping DES PCI) - otw patent LCx stent, patent RCA stent & Patent LIMA-LAD with CTO of prox LAD.   LEFT HEART CATHETERIZATION WITH CORONARY ANGIOGRAM N/A 05/15/2014   Procedure: LEFT HEART CATHETERIZATION  WITH CORONARY ANGIOGRAM;  Surgeon: Mickiel Albany, MD;  Location: [facility] CATH LAB;  Service: Cardiovascular;  Laterality: N/A;   LEFT HEART CATHETERIZATION WITH CORONARY/GRAFT ANGIOGRAM N/A 06/28/2014   Procedure: LEFT HEART CATHETERIZATION WITH Estella Helling;  Surgeon: Millicent Ally, MD;  Location: [facility] CATH LAB;  Service: Cardiovascular;  Laterality: N/A;   LUMBAR PERCUTANEOUS PEDICLE SCREW 1 LEVEL N/A 11/22/2012   Procedure: LUMBAR PERCUTANEOUS PEDICLE SCREW 1 LEVEL;  Surgeon: Isadora Mar, MD;  Location: [facility] NEURO ORS;  Service: Neurosurgery;  Laterality: N/A;  Lumbar Three-Four Percutaneous Pedicle Screw, Lateral approach   MINOR HEMORRHOIDECTOMY     NM MYOVIEW  LTD  05/26/2021   Lexiscan : Intermediate risk (similar findings to last study), with exception of reduced EF roughly 40%..  Prior basal inferior-inferolateral infarct without peri-infarct ischemia.   ->  Echo ordered -> (EF 45 to 50%)-when compared to prior Myoview , infarct appears similar.   PERCUTANEOUS CORONARY STENT INTERVENTION (PCI-S)  06/2003   PCI - RCA 2 overlapping Taxus DES 2.75 mm x 32 mm and 2.75 mm x 12 mm (3.0 mm); PCI-Cx-OM1 - Taxus DES 3.0 mm x 20 mm (3.1 mm);    PERCUTANEOUS CORONARY STENT INTERVENTION (PCI-S)  12/2005   80% ISR in proximal Taxus stent in RCA -- covered proximally with Cypher DES 3.0 mm x 12 mm   PERCUTANEOUS CORONARY STENT INTERVENTION (PCI-S) N/A 05/17/2014   Procedure: PERCUTANEOUS CORONARY STENT INTERVENTION (PCI-S);  Surgeon: Mickiel Albany, MD;  Location: [facility] CATH LAB: PCI pRCA stent ISR -  Promus DES 3.0 x 8 (3.25), OM distal stent edge - Promus DES 2.75 x 12 (3.1)   PERIPHERAL VASCULAR CATHETERIZATION  11/03/2016   Procedure: Embolization;  Surgeon: Adine Hoof, MD;  Location: [facility] INVASIVE CV LAB;  Service: Cardiovascular;;   POSTERIOR CERVICAL FUSION/FORAMINOTOMY N/A 05/02/2013   Procedure: POSTERIOR CERVICAL FUSION/FORAMINOTOMY CERVICAL SEVEN THORACIC-ONE;  Surgeon: Isadora Mar, MD;  Location: [facility] NEURO ORS;  Service: Neurosurgery;  Laterality: N/A;  POSTERIOR CERVICAL FUSION/FORAMINOTOMY CERVICAL SEVEN THORACIC-ONE   SPHINCTEROTOMY N/A 06/30/2015   Procedure: SPHINCTEROTOMY;  Surgeon: Asencion Blacksmith, MD;  Location: [facility] ENDOSCOPY;  Service: Endoscopy;  Laterality: N/A;   TOTAL KNEE ARTHROPLASTY Right    TOTAL KNEE ARTHROPLASTY Left 12/20/2016   Procedure: LEFT TOTAL KNEE ARTHROPLASTY;  Surgeon: Christie Cox, MD;  Location: [facility] OR;  Service: Orthopedics;  Laterality: Left;   TRANSTHORACIC ECHOCARDIOGRAM  06/10/2021   EF 45 to 50%.  Mildly reduced function.  Mild hypokinesis of basal-mid inferior and inferolateral wall consistent with prior infarct.  Severe LA dilation.  Mild to moderate MR with moderate MAC.  Mild calcific AS (mean gradient 11 mmHg) --> LV function seems unchanged.  Diastolic function improved.  Wall motion abnormality unchanged.  AS unchanged.   TRANSTHORACIC ECHOCARDIOGRAM  07/2018   Normal LV size.  EF 50 to 55% with mild HK of inferolateral wall.  GRII DD.  Mild AS with mean gradient 15 mm or greater.  Mild ascending aortic dilation.  Mildly increased PA pressures of 41 mmHg   UPPER GI ENDOSCOPY  07/03/2019   VISCERAL ANGIOGRAM  11/03/2016   Procedure: Visceral Angiogram;  Surgeon: Adine Hoof, MD;  Location: [facility] INVASIVE CV LAB;  Service: Cardiovascular;;    SH: Social History   Tobacco Use   Smoking status: Former    Current packs/day: 0.00    Average packs/day: 3.0 packs/day for 45.0 years (135.0 ttl pk-yrs)    Types:  Cigarettes    Start  date: 10/04/1938    Quit date: 10/05/1983    Years since quitting: 40.4   Smokeless tobacco: Never  Vaping Use   Vaping status: Never Used  Substance Use Topics   Alcohol use: Yes    Alcohol/week: 7.0 - 14.0 standard drinks of alcohol    Types: 7 - 14 Cans of beer per week    Comment: 1-2 beers per day   Drug use: No    ROS: Constitutional:  Negative for fever, chills, weight loss CV: Negative for chest pain, previous MI, hypertension Respiratory:  Negative for shortness of breath, wheezing, sleep apnea, frequent cough GI:  Negative for nausea, vomiting, bloody stool, GERD  PE: BP (!) 156/72   Pulse 85   Ht 5' 9.5" (1.765 m)   Wt 197 lb (89.4 kg)   BMI 28.67 kg/m  GENERAL APPEARANCE:  Well appearing, well developed, well nourished, NAD HEENT:  Atraumatic, normocephalic, oropharynx clear NECK:  Supple without lymphadenopathy or thyromegaly ABDOMEN:  Soft, non-tender, no masses EXTREMITIES:  Moves all extremities well, without clubbing, cyanosis, or edema NEUROLOGIC:  Alert and oriented x 3, normal gait, CN II-XII grossly intact MENTAL STATUS:  appropriate BACK:  Non-tender to palpation, No CVAT SKIN:  Warm, dry, and intact   Results: U/A: negative

## 2024-03-13 ENCOUNTER — Other Ambulatory Visit: Payer: Self-pay | Admitting: Family Medicine

## 2024-03-13 DIAGNOSIS — I110 Hypertensive heart disease with heart failure: Secondary | ICD-10-CM

## 2024-04-17 ENCOUNTER — Ambulatory Visit: Payer: Self-pay

## 2024-04-17 ENCOUNTER — Ambulatory Visit: Admitting: Medical

## 2024-04-17 ENCOUNTER — Ambulatory Visit (HOSPITAL_COMMUNITY)

## 2024-04-17 ENCOUNTER — Encounter: Payer: Self-pay | Admitting: Medical

## 2024-04-17 VITALS — BP 110/70 | HR 70 | Temp 97.2°F | Ht 69.5 in | Wt 197.2 lb

## 2024-04-17 DIAGNOSIS — R8290 Unspecified abnormal findings in urine: Secondary | ICD-10-CM

## 2024-04-17 DIAGNOSIS — R310 Gross hematuria: Secondary | ICD-10-CM

## 2024-04-17 LAB — POCT URINALYSIS DIP (PROADVANTAGE DEVICE)
Glucose, UA: NEGATIVE mg/dL
Ketones, POC UA: NEGATIVE mg/dL
Nitrite, UA: POSITIVE — AB
Protein Ur, POC: 100 mg/dL — AB
Specific Gravity, Urine: 1.01
Urobilinogen, Ur: 0.2
pH, UA: 6 (ref 5.0–8.0)

## 2024-04-17 MED ORDER — NITROFURANTOIN MONOHYD MACRO 100 MG PO CAPS
100.0000 mg | ORAL_CAPSULE | Freq: Two times a day (BID) | ORAL | 0 refills | Status: DC
Start: 2024-04-17 — End: 2024-08-01

## 2024-04-17 NOTE — Telephone Encounter (Signed)
 Pt called back in and gave results, and to get started on antibiotics. Pt wants to know if he needs to get appt to see urologist.

## 2024-04-17 NOTE — Progress Notes (Signed)
 Subjective:  [name] is a 88 y.o. male who presents for Chief Complaint  Patient presents with   Hematuria    Blood in urine as well as clots that started Sat, stopped and started again today. Not scheduled to see Urology again until Nov-they were unsure whether to call them or come here.      Here for blood in urine.  Here with wife.  He notes for the past few days having clots coming out in the urine, feels backed up and then the clot will break loose, no significant pain but is a little blood in the urine.  No fever, no bodyaches or chills, no nausea vomiting or diarrhea.  No belly pain.  He saw urology in February and March, had some tests.  No other aggravating or relieving factors.    No other c/o.  Past Medical History:  Diagnosis Date   Adenomatous colon polyp    Ankle edema    Chronic   Asthma    CAD S/P percutaneous coronary angioplasty 2004   a) '04: Staged Taxus DES PCI to RCA (2 Taxus 2.75 x 32 & 12) and Cx-OM (Taxus 3 x 20);;'07 - PCI to pRCA ISR- Cypher DES; c) 05/2014: pPCI pRCA stentISR (Promus DES 3 x 8) OM1 distal to stent (Promus DES 2.75 x 12 - 3.1); 11/'22: distal LCx stent edge 99% -> Onyx Frontier DES 2.5 x 15 - 2.8 (overlaps prior stent)   CAD, multiple vessel 1985   Most recent July 2018: Admitted for non-STEMI. Occluded LAD with patent LIMA (SP1 now occluded).  Patent stents in proximal and mid RCA as well as circumflex-OM 3. Known occlusion of SVG-OM. EF 45-50%   CHF (congestive heart failure) (HCC)    Cholelithiasis - with cholangitis & choledocholithiasis    status post ERCP with removal of calculi and biliary stent placement.   Chronic anemia    On iron supplement; history of positive guaiac - negative colonoscopy in 1996.; Thought to be related to hemorrhoids; status post hemorrhoidectomy   Chronic back pain     multiple surgeries; C-spine and lumbar   Diabetes mellitus    no meds- pt states he is no longer DM   Diverticulitis of colon 1996    Diverticulosis    Dyslipidemia, goal LDL below 70    Erectile dysfunction    Exertional dyspnea    Chronic baseline SOB with ambulation   GERD (gastroesophageal reflux disease)    Grade II diastolic dysfunction    H/O: pneumonia 11/2012   Hemorrhoids    Hiatal hernia    History of: ST elevation myocardial infarction (STEMI) involving left circumflex coronary artery with complication 1985   PTCA-circumflex; PCI in 1991   Hypertension    Moderate aortic stenosis by prior echocardiogram 07/2015   Normal LV function - EF 55-60%.. Abnormal relaxation. Mild-moderate aortic stenosis (peak/mean gradient 18/10 mmH)   Osteoarthritis of both knees    And back; multiple back surgeries, right knee arthroplasty and left knee arthroscopic surgery x2   Pneumonia 2016   S/P AAA (abdominal aortic aneurysm) repair 08/30/2012   s/p EVAR   S/P CABG x 2 1997   LIMA-LAD, SVG-OM   Sleep apnea    pt. states he was told to return for f/u, to be fitted for Cpap, but pt. reports that he didn't follow up-no cpap used   Statin myopathy 09/30/2015   Current Outpatient Medications on File Prior to Visit  Medication Sig Dispense Refill  amLODipine  (NORVASC ) 5 MG tablet Take 1 tablet by mouth once daily 90 tablet 2   aspirin  EC 81 MG tablet Take 81 mg by mouth daily. Swallow whole.     Choline Fenofibrate  (FENOFIBRIC ACID ) 135 MG CPDR Take 1 tablet by mouth daily. 90 capsule 3   ezetimibe  (ZETIA ) 10 MG tablet Take 1 tablet by mouth once daily 90 tablet 2   Ferrous Sulfate  (IRON PO) Take 65 mg by mouth daily.     finasteride  (PROSCAR ) 5 MG tablet Take 1 tablet (5 mg total) by mouth daily. 90 tablet 3   glucose blood (ONETOUCH VERIO) test strip 1 each by Other route as needed for other. Use as instructed 100 each 2   Multiple Vitamins-Minerals (MULTIVITAMIN WITH MINERALS) tablet Take 1 tablet by mouth daily.     pantoprazole  (PROTONIX ) 40 MG tablet Take 1 tablet by mouth once daily 90 tablet 4   ranolazine   (RANEXA ) 500 MG 12 hr tablet Take 1 tablet by mouth twice daily 180 tablet 3   valsartan -hydrochlorothiazide  (DIOVAN -HCT) 320-12.5 MG tablet Take 1 tablet by mouth once daily 90 tablet 0   acetaminophen  (TYLENOL ) 500 MG tablet Take 1,000 mg by mouth every 6 (six) hours as needed for mild pain (pain score 1-3), moderate pain (pain score 4-6) or headache. Reported on 03/16/2016 (Patient not taking: Reported on 04/17/2024)     albuterol  (VENTOLIN  HFA) 108 (90 Base) MCG/ACT inhaler Inhale 2 puffs into the lungs every 6 (six) hours as needed for wheezing or shortness of breath. (Patient not taking: Reported on 04/17/2024) 18 g 0   fluticasone -salmeterol (ADVAIR) 100-50 MCG/ACT AEPB Inhale 1 puff into the lungs 2 (two) times daily. (Patient not taking: Reported on 04/17/2024) 1 each 0   furosemide  (LASIX ) 20 MG tablet Take 2 tablets (40 mg total) by mouth daily as needed. (Patient not taking: Reported on 04/17/2024) 30 tablet 6   sildenafil  (VIAGRA ) 100 MG tablet TAKE ONE TABLET BY MOUTH DAILY AS NEEDED FOR ERECTILE DYSFUNCTION (Patient not taking: Reported on 04/17/2024) 90 tablet 1   No current facility-administered medications on file prior to visit.     The following portions of the patient's history were reviewed and updated as appropriate: allergies, current medications, past family history, past medical history, past social history, past surgical history and problem list.  ROS Otherwise as in subjective above     Objective: BP 110/70   Pulse 70   Temp (!) 97.2 F (36.2 C) (Tympanic)   Ht 5' 9.5 (1.765 m)   Wt 197 lb 3.2 oz (89.4 kg)   SpO2 97%   BMI 28.70 kg/m   General appearance: alert, no distress, well developed, well nourished Abdomen: +bs, soft, non tender, non distended, no masses, no hepatomegaly, no splenomegaly Pulses: 2+ radial pulses, 2+ pedal pulses, normal cap refill Ext: no edema   Assessment: Encounter Diagnoses  Name Primary?   Gross hematuria Yes   Abnormal  urinalysis      Plan: I reviewed urology notes from February and May 2025 including ultrasound, cystoscopy.  He has a quite large prostate, he has a friable area of the urethra that is apt to bleed.  Given his urinalysis results today we will send a culture and I will put him on Macrobid .  I will reach out to urologist about any other recommendations they may have for his ongoing hematuria   [name] was seen today for hematuria.  Diagnoses and all orders for this visit:  Gross hematuria -  POCT Urinalysis DIP (Proadvantage Device) -     Urine Culture  Abnormal urinalysis  Other orders -     nitrofurantoin , macrocrystal-monohydrate, (MACROBID ) 100 MG capsule; Take 1 capsule (100 mg total) by mouth 2 (two) times daily.   Follow up: with urology

## 2024-04-17 NOTE — Telephone Encounter (Signed)
 FYI Only or Action Required?: Action required by provider: medication refill request and clinical question for provider.  Patient was last seen in primary care on 02/29/2024 by Joyce Norleen BROCKS, MD.  Called Nurse Triage reporting Hematuria.  Symptoms began Sunday.  Interventions attempted: Rest, hydration, or home remedies.  Symptoms are: gradually worsening.  Triage Disposition: See Physician Within 24 Hours  Patient/caregiver understands and will follow disposition?: No, refuses disposition       Copied from CRM [id number]. Topic: Clinical - Red Word Triage >> Apr 17, 2024 10:14 AM Lavanda D wrote: Red Word that prompted transfer to Nurse Triage: Patient's wife calling, urine has blood clots in it. No other symptoms. Had this prior and was diagnosed with UTI, wife thinks he is experiencing the same issue. Reason for Disposition  Blood in urine  (Exception: Could be normal menstrual bleeding.)  Answer Assessment - Initial Assessment Questions 1. COLOR of URINE: Describe the color of the urine.  (e.g., tea-colored, pink, red, bloody) Do you have blood clots in your urine? (e.g., none, pea, grape, small coin)     Blood and blood clots in urine 2. ONSET: When did the bleeding start?      Sunday 3. EPISODES: How many times has there been blood in the urine? or How many times today?     Each time 4. PAIN with URINATION: Is there any pain with passing your urine? If Yes, ask: How bad is the pain?  (Scale 1-10; or mild, moderate, severe)     Pain with urination at times 5. FEVER: Do you have a fever? If Yes, ask: What is your temperature, how was it measured, and when did it start?     no 6. ASSOCIATED SYMPTOMS: Are you passing urine more frequently than usual?     yes 7. OTHER SYMPTOMS: Do you have any other symptoms? (e.g., back/flank pain, abdomen pain, vomiting)     no 8. PREGNANCY: Is there any chance you are pregnant? When was your last menstrual  period?     No   Pt's wife called to report s/s and is requesting medication be called in my PCP for pt - stated pt had the same issue earlier in the year.  Please call pt /wife to inform if Rx will be called in for patient or if the patient needs to schedule an appt to come in for evaluation.  Protocols used: Urine - Blood In-A-AH

## 2024-04-17 NOTE — Progress Notes (Signed)
 Left message for pt to call back

## 2024-04-17 NOTE — Telephone Encounter (Signed)
 Left message for pt to call back

## 2024-04-17 NOTE — Telephone Encounter (Signed)
-----   Message from Ludie Gent sent at 04/17/2024  3:31 PM EDT ----- Please let him know that I talked to urology.  The prostate itself seems to have some issues leading to the bleeding and the treatment right now is the finasteride  he is already on  But the new symptoms and findings of the urine suggest infection so go ahead and begin the Macrobid  antibiotic ----- Message ----- From: Gent Alm RAMAN, PA-C Sent: 04/17/2024   2:36 PM EDT To: Adine DOROTHA Manly, MD; Alm RAMAN Gent, #  Hello Dr. Manly I reviewed back over urology notes from February and March.  Is his findings similar or the same as interstitial cystitis type findings?  Is there specific treatment for the friable area that seems to want to bleed?  He came in today for blood clots, gross hematuria, and urinalysis was abnormal showing nitrites today.   I am going to go ahead and place him on Macrobid  today for possible urinary tract infection  I appreciate your feedback Ludie Gent PA-C

## 2024-04-19 ENCOUNTER — Ambulatory Visit: Payer: Self-pay | Admitting: Internal Medicine

## 2024-04-19 LAB — URINE CULTURE: Organism ID, Bacteria: NO GROWTH

## 2024-05-01 ENCOUNTER — Other Ambulatory Visit: Payer: Self-pay

## 2024-05-01 DIAGNOSIS — E785 Hyperlipidemia, unspecified: Secondary | ICD-10-CM

## 2024-05-01 MED ORDER — FENOFIBRIC ACID 135 MG PO CPDR: 1.0000 | DELAYED_RELEASE_CAPSULE | Freq: Every day | ORAL | 2 refills | Status: AC

## 2024-05-08 ENCOUNTER — Ambulatory Visit: Admitting: Family Medicine

## 2024-05-24 ENCOUNTER — Ambulatory Visit (HOSPITAL_COMMUNITY)
Admission: RE | Admit: 2024-05-24 | Discharge: 2024-05-24 | Disposition: A | Discharge status: Home / Self Care | Source: Ambulatory Visit | Attending: Cardiology | Admitting: Cardiology

## 2024-05-24 DIAGNOSIS — I350 Nonrheumatic aortic (valve) stenosis: Secondary | ICD-10-CM | POA: Diagnosis present

## 2024-05-24 DIAGNOSIS — I38 Endocarditis, valve unspecified: Secondary | ICD-10-CM | POA: Diagnosis not present

## 2024-05-24 LAB — ECHOCARDIOGRAM COMPLETE
AR max vel: 1.41 cm2
AV Area VTI: 1.49 cm2
AV Area mean vel: 1.49 cm2
AV Mean grad: 10.2 mmHg
AV Peak grad: 20.1 mmHg
Ao pk vel: 2.24 m/s
MV M vel: 5.9 m/s
MV Peak grad: 139.2 mmHg
S' Lateral: 4.6 cm

## 2024-05-25 ENCOUNTER — Ambulatory Visit: Payer: Self-pay | Admitting: Physician Assistant

## 2024-06-18 ENCOUNTER — Other Ambulatory Visit: Payer: Self-pay | Admitting: Family Medicine

## 2024-06-18 DIAGNOSIS — I110 Hypertensive heart disease with heart failure: Secondary | ICD-10-CM

## 2024-06-20 LAB — HM DIABETES EYE EXAM

## 2024-07-31 ENCOUNTER — Other Ambulatory Visit: Payer: Self-pay

## 2024-07-31 ENCOUNTER — Emergency Department (HOSPITAL_BASED_OUTPATIENT_CLINIC_OR_DEPARTMENT_OTHER)

## 2024-07-31 ENCOUNTER — Other Ambulatory Visit: Payer: Self-pay | Admitting: Cardiology

## 2024-07-31 ENCOUNTER — Inpatient Hospital Stay (HOSPITAL_BASED_OUTPATIENT_CLINIC_OR_DEPARTMENT_OTHER)
Admission: EM | Admit: 2024-07-31 | Discharge: 2024-08-05 | DRG: 552 | Disposition: A | Discharge status: Home Health | Attending: General Surgery | Admitting: General Surgery

## 2024-07-31 ENCOUNTER — Encounter (HOSPITAL_BASED_OUTPATIENT_CLINIC_OR_DEPARTMENT_OTHER): Payer: Self-pay

## 2024-07-31 DIAGNOSIS — R296 Repeated falls: Secondary | ICD-10-CM | POA: Diagnosis present

## 2024-07-31 DIAGNOSIS — Z7951 Long term (current) use of inhaled steroids: Secondary | ICD-10-CM

## 2024-07-31 DIAGNOSIS — N400 Enlarged prostate without lower urinary tract symptoms: Secondary | ICD-10-CM | POA: Diagnosis present

## 2024-07-31 DIAGNOSIS — Z7982 Long term (current) use of aspirin: Secondary | ICD-10-CM

## 2024-07-31 DIAGNOSIS — Z981 Arthrodesis status: Secondary | ICD-10-CM

## 2024-07-31 DIAGNOSIS — T82310A Breakdown (mechanical) of aortic (bifurcation) graft (replacement), initial encounter: Secondary | ICD-10-CM

## 2024-07-31 DIAGNOSIS — Z8701 Personal history of pneumonia (recurrent): Secondary | ICD-10-CM

## 2024-07-31 DIAGNOSIS — K219 Gastro-esophageal reflux disease without esophagitis: Secondary | ICD-10-CM | POA: Diagnosis present

## 2024-07-31 DIAGNOSIS — N183 Chronic kidney disease, stage 3 unspecified: Secondary | ICD-10-CM | POA: Diagnosis present

## 2024-07-31 DIAGNOSIS — Y832 Surgical operation with anastomosis, bypass or graft as the cause of abnormal reaction of the patient, or of later complication, without mention of misadventure at the time of the procedure: Secondary | ICD-10-CM | POA: Diagnosis present

## 2024-07-31 DIAGNOSIS — S32029A Unspecified fracture of second lumbar vertebra, initial encounter for closed fracture: Secondary | ICD-10-CM | POA: Diagnosis not present

## 2024-07-31 DIAGNOSIS — Z8249 Family history of ischemic heart disease and other diseases of the circulatory system: Secondary | ICD-10-CM

## 2024-07-31 DIAGNOSIS — Z96653 Presence of artificial knee joint, bilateral: Secondary | ICD-10-CM | POA: Diagnosis present

## 2024-07-31 DIAGNOSIS — Z6828 Body mass index (BMI) 28.0-28.9, adult: Secondary | ICD-10-CM

## 2024-07-31 DIAGNOSIS — Z888 Allergy status to other drugs, medicaments and biological substances status: Secondary | ICD-10-CM

## 2024-07-31 DIAGNOSIS — E11A Type 2 diabetes mellitus without complications in remission: Secondary | ICD-10-CM | POA: Diagnosis present

## 2024-07-31 DIAGNOSIS — M4802 Spinal stenosis, cervical region: Secondary | ICD-10-CM | POA: Diagnosis present

## 2024-07-31 DIAGNOSIS — I5022 Chronic systolic (congestive) heart failure: Secondary | ICD-10-CM | POA: Diagnosis present

## 2024-07-31 DIAGNOSIS — W01198A Fall on same level from slipping, tripping and stumbling with subsequent striking against other object, initial encounter: Secondary | ICD-10-CM | POA: Diagnosis present

## 2024-07-31 DIAGNOSIS — T148XXA Other injury of unspecified body region, initial encounter: Secondary | ICD-10-CM | POA: Diagnosis present

## 2024-07-31 DIAGNOSIS — Z9181 History of falling: Secondary | ICD-10-CM

## 2024-07-31 DIAGNOSIS — T1490XA Injury, unspecified, initial encounter: Secondary | ICD-10-CM | POA: Diagnosis present

## 2024-07-31 DIAGNOSIS — E785 Hyperlipidemia, unspecified: Secondary | ICD-10-CM | POA: Diagnosis present

## 2024-07-31 DIAGNOSIS — I25810 Atherosclerosis of coronary artery bypass graft(s) without angina pectoris: Secondary | ICD-10-CM | POA: Diagnosis present

## 2024-07-31 DIAGNOSIS — I9789 Other postprocedural complications and disorders of the circulatory system, not elsewhere classified: Secondary | ICD-10-CM | POA: Diagnosis present

## 2024-07-31 DIAGNOSIS — I10 Essential (primary) hypertension: Secondary | ICD-10-CM | POA: Diagnosis present

## 2024-07-31 DIAGNOSIS — I5020 Unspecified systolic (congestive) heart failure: Secondary | ICD-10-CM | POA: Diagnosis present

## 2024-07-31 DIAGNOSIS — I2582 Chronic total occlusion of coronary artery: Secondary | ICD-10-CM | POA: Diagnosis present

## 2024-07-31 DIAGNOSIS — S40812A Abrasion of left upper arm, initial encounter: Secondary | ICD-10-CM | POA: Diagnosis present

## 2024-07-31 DIAGNOSIS — W19XXXA Unspecified fall, initial encounter: Secondary | ICD-10-CM

## 2024-07-31 DIAGNOSIS — I7143 Infrarenal abdominal aortic aneurysm, without rupture: Principal | ICD-10-CM

## 2024-07-31 DIAGNOSIS — I2510 Atherosclerotic heart disease of native coronary artery without angina pectoris: Secondary | ICD-10-CM | POA: Diagnosis present

## 2024-07-31 DIAGNOSIS — I255 Ischemic cardiomyopathy: Secondary | ICD-10-CM | POA: Diagnosis present

## 2024-07-31 DIAGNOSIS — Z803 Family history of malignant neoplasm of breast: Secondary | ICD-10-CM

## 2024-07-31 DIAGNOSIS — N1832 Chronic kidney disease, stage 3b: Secondary | ICD-10-CM | POA: Diagnosis present

## 2024-07-31 DIAGNOSIS — Z823 Family history of stroke: Secondary | ICD-10-CM

## 2024-07-31 DIAGNOSIS — Z87891 Personal history of nicotine dependence: Secondary | ICD-10-CM

## 2024-07-31 DIAGNOSIS — I252 Old myocardial infarction: Secondary | ICD-10-CM

## 2024-07-31 DIAGNOSIS — I714 Abdominal aortic aneurysm, without rupture, unspecified: Secondary | ICD-10-CM | POA: Diagnosis present

## 2024-07-31 DIAGNOSIS — Z8261 Family history of arthritis: Secondary | ICD-10-CM

## 2024-07-31 DIAGNOSIS — E663 Overweight: Secondary | ICD-10-CM | POA: Diagnosis present

## 2024-07-31 DIAGNOSIS — I130 Hypertensive heart and chronic kidney disease with heart failure and stage 1 through stage 4 chronic kidney disease, or unspecified chronic kidney disease: Secondary | ICD-10-CM | POA: Diagnosis present

## 2024-07-31 DIAGNOSIS — D649 Anemia, unspecified: Secondary | ICD-10-CM | POA: Diagnosis present

## 2024-07-31 DIAGNOSIS — Z79899 Other long term (current) drug therapy: Secondary | ICD-10-CM

## 2024-07-31 DIAGNOSIS — Y92009 Unspecified place in unspecified non-institutional (private) residence as the place of occurrence of the external cause: Secondary | ICD-10-CM

## 2024-07-31 DIAGNOSIS — Z8679 Personal history of other diseases of the circulatory system: Secondary | ICD-10-CM

## 2024-07-31 DIAGNOSIS — Z833 Family history of diabetes mellitus: Secondary | ICD-10-CM

## 2024-07-31 DIAGNOSIS — E1122 Type 2 diabetes mellitus with diabetic chronic kidney disease: Secondary | ICD-10-CM | POA: Diagnosis present

## 2024-07-31 DIAGNOSIS — I480 Paroxysmal atrial fibrillation: Secondary | ICD-10-CM | POA: Diagnosis present

## 2024-07-31 DIAGNOSIS — Z955 Presence of coronary angioplasty implant and graft: Secondary | ICD-10-CM

## 2024-07-31 DIAGNOSIS — I25119 Atherosclerotic heart disease of native coronary artery with unspecified angina pectoris: Secondary | ICD-10-CM | POA: Diagnosis present

## 2024-07-31 DIAGNOSIS — S40811A Abrasion of right upper arm, initial encounter: Secondary | ICD-10-CM | POA: Diagnosis present

## 2024-07-31 DIAGNOSIS — Z885 Allergy status to narcotic agent status: Secondary | ICD-10-CM

## 2024-07-31 DIAGNOSIS — Z860101 Personal history of adenomatous and serrated colon polyps: Secondary | ICD-10-CM

## 2024-07-31 DIAGNOSIS — N401 Enlarged prostate with lower urinary tract symptoms: Secondary | ICD-10-CM | POA: Diagnosis present

## 2024-07-31 LAB — CBC WITH DIFFERENTIAL/PLATELET
Abs Immature Granulocytes: 0.03 K/uL (ref 0.00–0.07)
Basophils Absolute: 0 K/uL (ref 0.0–0.1)
Basophils Relative: 0 %
Eosinophils Absolute: 0 K/uL (ref 0.0–0.5)
Eosinophils Relative: 0 %
HCT: 30.3 % — ABNORMAL LOW (ref 39.0–52.0)
Hemoglobin: 10 g/dL — ABNORMAL LOW (ref 13.0–17.0)
Immature Granulocytes: 0 %
Lymphocytes Relative: 20 %
Lymphs Abs: 1.5 K/uL (ref 0.7–4.0)
MCH: 31.3 pg (ref 26.0–34.0)
MCHC: 33 g/dL (ref 30.0–36.0)
MCV: 94.7 fL (ref 80.0–100.0)
Monocytes Absolute: 0.6 K/uL (ref 0.1–1.0)
Monocytes Relative: 9 %
Neutro Abs: 5.1 K/uL (ref 1.7–7.7)
Neutrophils Relative %: 71 %
Platelets: 202 K/uL (ref 150–400)
RBC: 3.2 MIL/uL — ABNORMAL LOW (ref 4.22–5.81)
RDW: 15.1 % (ref 11.5–15.5)
WBC: 7.3 K/uL (ref 4.0–10.5)
nRBC: 0 % (ref 0.0–0.2)

## 2024-07-31 LAB — URINALYSIS, ROUTINE W REFLEX MICROSCOPIC
Bilirubin Urine: NEGATIVE
Glucose, UA: NEGATIVE mg/dL
Ketones, ur: NEGATIVE mg/dL
Leukocytes,Ua: NEGATIVE
Nitrite: NEGATIVE
Protein, ur: NEGATIVE mg/dL
Specific Gravity, Urine: 1.015 (ref 1.005–1.030)
pH: 5.5 (ref 5.0–8.0)

## 2024-07-31 LAB — URINALYSIS, MICROSCOPIC (REFLEX)

## 2024-07-31 LAB — COMPREHENSIVE METABOLIC PANEL WITH GFR
ALT: 13 U/L (ref 0–44)
AST: 23 U/L (ref 15–41)
Albumin: 3.7 g/dL (ref 3.5–5.0)
Alkaline Phosphatase: 39 U/L (ref 38–126)
Anion gap: 12 (ref 5–15)
BUN: 29 mg/dL — ABNORMAL HIGH (ref 8–23)
CO2: 23 mmol/L (ref 22–32)
Calcium: 9.7 mg/dL (ref 8.9–10.3)
Chloride: 102 mmol/L (ref 98–111)
Creatinine, Ser: 1.73 mg/dL — ABNORMAL HIGH (ref 0.61–1.24)
GFR, Estimated: 38 mL/min — ABNORMAL LOW (ref >60)
Glucose, Bld: 118 mg/dL — ABNORMAL HIGH (ref 70–99)
Potassium: 4.3 mmol/L (ref 3.5–5.1)
Sodium: 137 mmol/L (ref 135–145)
Total Bilirubin: 1.2 mg/dL (ref 0.0–1.2)
Total Protein: 6.5 g/dL (ref 6.5–8.1)

## 2024-07-31 MED ORDER — HYDROMORPHONE HCL 1 MG/ML IJ SOLN
1.0000 mg | Freq: Once | INTRAMUSCULAR | Status: AC
  Administered 2024-07-31: 1 mg via INTRAVENOUS
  Filled 2024-07-31: qty 1

## 2024-07-31 MED ORDER — ONDANSETRON HCL 4 MG/2ML IJ SOLN: 4.0000 mg | Freq: Three times a day (TID) | INTRAMUSCULAR | Status: DC | PRN

## 2024-07-31 MED ORDER — DIAZEPAM 5 MG/ML IJ SOLN
5.0000 mg | Freq: Once | INTRAMUSCULAR | Status: AC
  Administered 2024-07-31: 5 mg via INTRAVENOUS
  Filled 2024-07-31: qty 2

## 2024-07-31 MED ORDER — ONDANSETRON HCL 4 MG/2ML IJ SOLN
4.0000 mg | Freq: Once | INTRAMUSCULAR | Status: AC
  Administered 2024-07-31: 4 mg via INTRAVENOUS
  Filled 2024-07-31: qty 2

## 2024-07-31 MED ORDER — SODIUM CHLORIDE 0.9 % IV BOLUS
1000.0000 mL | Freq: Once | INTRAVENOUS | Status: AC
  Administered 2024-07-31: 1000 mL via INTRAVENOUS

## 2024-07-31 MED ORDER — IOHEXOL 350 MG/ML SOLN
80.0000 mL | Freq: Once | INTRAVENOUS | Status: AC | PRN
  Administered 2024-07-31: 80 mL via INTRAVENOUS

## 2024-07-31 MED ORDER — MORPHINE SULFATE (PF) 4 MG/ML IV SOLN
4.0000 mg | Freq: Once | INTRAVENOUS | Status: DC
  Filled 2024-07-31: qty 1

## 2024-07-31 MED ORDER — HYDROMORPHONE HCL 1 MG/ML IJ SOLN
1.0000 mg | INTRAMUSCULAR | Status: DC | PRN
  Administered 2024-07-31: 1 mg via INTRAVENOUS
  Filled 2024-07-31: qty 1

## 2024-07-31 NOTE — ED Provider Notes (Signed)
  EMERGENCY DEPARTMENT AT [facility] Provider Note   CSN: [id number] Arrival date & time: 07/31/24  1541     Patient presents with: [name] is a 88 y.o. male history of hypertension, multiple spinal surgeries here presenting with fall.  Patient states that 5 days ago, he had a mechanical fall.  He states that he is in the hallway and did not use his cane and tripped and fell and hit his head on the rocker.  He states that the lamp then fell and hit his back.  He states that he also landed on his knees.  Complaining of bilateral knee pain abdominal pain and back pain and headache.  Patient is prescribed Vicodin and also was prescribed muscle relaxant with minimal relief.  Denies any loss of consciousness.    The history is provided by the patient.       Prior to Admission medications   Medication Sig Start Date End Date Taking? Authorizing Provider  acetaminophen  (TYLENOL ) 500 MG tablet Take 1,000 mg by mouth every 6 (six) hours as needed for mild pain (pain score 1-3), moderate pain (pain score 4-6) or headache. Reported on 03/16/2016 Patient not taking: Reported on 04/17/2024    [provider]  albuterol  (VENTOLIN  HFA) 108 (90 Base) MCG/ACT inhaler Inhale 2 puffs into the lungs every 6 (six) hours as needed for wheezing or shortness of breath. Patient not taking: Reported on 04/17/2024 08/24/23   Bulah Alm RAMAN, PA-C  amLODipine  (NORVASC ) 5 MG tablet Take 1 tablet by mouth once daily 11/09/23   Anner Alm ORN, MD  aspirin  EC 81 MG tablet Take 81 mg by mouth daily. Swallow whole.    [provider]  Choline Fenofibrate  (FENOFIBRIC ACID ) 135 MG CPDR Take 1 tablet by mouth daily. 05/01/24   Madie Jon Garre, PA  ezetimibe  (ZETIA ) 10 MG tablet Take 1 tablet by mouth once daily 10/10/23   Anner Alm ORN, MD  Ferrous Sulfate  (IRON PO) Take 65 mg by mouth daily.    [provider]  finasteride  (PROSCAR ) 5 MG tablet Take 1  tablet (5 mg total) by mouth daily. 11/15/23   Shona Layman BROCKS, MD  fluticasone -salmeterol (ADVAIR) 100-50 MCG/ACT AEPB Inhale 1 puff into the lungs 2 (two) times daily. Patient not taking: Reported on 04/17/2024 08/24/23   Tysinger, Alm RAMAN, PA-C  furosemide  (LASIX ) 20 MG tablet Take 2 tablets (40 mg total) by mouth daily as needed. Patient not taking: Reported on 04/17/2024 02/03/24   Madie Jon Garre, PA  glucose blood [employer] VERIO) test strip 1 each by Other route as needed for other. Use as instructed 06/01/22   Lalonde, John C, MD  Multiple Vitamins-Minerals (MULTIVITAMIN WITH MINERALS) tablet Take 1 tablet by mouth daily.    [provider]  nitrofurantoin , macrocrystal-monohydrate, (MACROBID ) 100 MG capsule Take 1 capsule (100 mg total) by mouth 2 (two) times daily. 04/17/24   Tysinger, Alm RAMAN, PA-C  pantoprazole  (PROTONIX ) 40 MG tablet Take 1 tablet by mouth once daily 07/06/23   Anner Alm ORN, MD  ranolazine  (RANEXA ) 500 MG 12 hr tablet Take 1 tablet by mouth twice daily 02/29/24   Anner Alm ORN, MD  sildenafil  (VIAGRA ) 100 MG tablet TAKE ONE TABLET BY MOUTH DAILY AS NEEDED FOR ERECTILE DYSFUNCTION Patient not taking: Reported on 04/17/2024 10/27/21   Joyce Norleen BROCKS, MD  valsartan -hydrochlorothiazide  (DIOVAN -HCT) 320-12.5 MG tablet Take 1 tablet by mouth once daily 06/18/24   Lalonde, John C,  MD    Allergies: Oxycodone , Lisinopril, Statins, and Welchol [colesevelam hcl]    Review of Systems  Gastrointestinal:  Positive for abdominal pain.  Neurological:  Positive for headaches.  All other systems reviewed and are negative.   Updated Vital Signs BP 131/88   Pulse 66   Temp 97.7 F (36.5 C) (Oral)   Resp 17   SpO2 95%   Physical Exam Vitals and nursing note reviewed.  Constitutional:      Appearance: Normal appearance.  HENT:     Head: Normocephalic.     Mouth/Throat:     Comments: Bruising around the right eye.  Extraocular movements are intact Eyes:      Pupils: Pupils are equal, round, and reactive to light.  Cardiovascular:     Rate and Rhythm: Normal rate and regular rhythm.     Pulses: Normal pulses.     Heart sounds: Normal heart sounds.  Pulmonary:     Effort: Pulmonary effort is normal.     Breath sounds: Normal breath sounds.  Abdominal:     General: Abdomen is flat.     Comments: Mild diffuse abdominal tenderness but no obvious bruising  Musculoskeletal:     Cervical back: Normal range of motion and neck supple.     Comments: Tenderness of the lower spine area.  Patient does have multiple spinal surgery scars.  Patient has bruising bilateral knees at the right elbow.  Patient has a skin tear of the right elbow  Neurological:     General: No focal deficit present.     Mental Status: He is alert and oriented to person, place, and time.  Psychiatric:        Mood and Affect: Mood normal.        Behavior: Behavior normal.     (all labs ordered are listed, but only abnormal results are displayed) Labs Reviewed  CBC WITH DIFFERENTIAL/PLATELET - Abnormal; Notable for the following components:      Result Value   RBC 3.20 (*)    Hemoglobin 10.0 (*)    HCT 30.3 (*)    All other components within normal limits  COMPREHENSIVE METABOLIC PANEL WITH GFR - Abnormal; Notable for the following components:   Glucose, Bld 118 (*)    BUN 29 (*)    Creatinine, Ser 1.73 (*)    GFR, Estimated 38 (*)    All other components within normal limits  URINALYSIS, ROUTINE W REFLEX MICROSCOPIC - Abnormal; Notable for the following components:   Hgb urine dipstick TRACE (*)    All other components within normal limits  URINALYSIS, MICROSCOPIC (REFLEX) - Abnormal; Notable for the following components:   Bacteria, UA FEW (*)    All other components within normal limits    EKG: None  Radiology: CT Angio Chest/Abd/Pel for Dissection W and/or Wo Contrast Result Date: 07/31/2024 CLINICAL DATA:  Acute aortic syndrome suspected.  Fall. EXAM: CT  ANGIOGRAPHY CHEST, ABDOMEN AND PELVIS TECHNIQUE: Non-contrast CT of the chest was initially obtained. Multidetector CT imaging through the chest, abdomen and pelvis was performed using the standard protocol during bolus administration of intravenous contrast. Multiplanar reconstructed images and MIPs were obtained and reviewed to evaluate the vascular anatomy. RADIATION DOSE REDUCTION: This exam was performed according to the departmental dose-optimization program which includes automated exposure control, adjustment of the mA and/or kV according to patient size and/or use of iterative reconstruction technique. CONTRAST:  80mL OMNIPAQUE  IOHEXOL  350 MG/ML SOLN COMPARISON:  CT abdomen pelvis dated 07/01/2023. FINDINGS:  CTA CHEST FINDINGS Cardiovascular: Mild cardiomegaly. No pericardial effusion. There is advanced 3 vessel coronary vascular calcification and calcification of the mitral annulus. Moderate atherosclerotic calcification of the thoracic aorta. No abdominal dilatation or dissection. The origins of the great vessels of the aortic arch and the central pulmonary arteries appear patent. Mediastinum/Nodes: No hilar or mediastinal adenopathy. Moderate size hiatal hernia. The esophagus is grossly unremarkable. No mediastinal fluid collection. Lungs/Pleura: Trace right pleural effusion. There is minimal subpleural atelectasis of the right lower lobe. There are bibasilar subpleural linear atelectasis/scarring. No consolidative changes or pneumothorax. The central airways are patent. Musculoskeletal: Osteopenia with degenerative changes of the spine. Median sternotomy wires. No acute osseous pathology. Review of the MIP images confirms the above findings. CTA ABDOMEN AND PELVIS FINDINGS VASCULAR Aorta: Advanced atherosclerotic calcification of the abdominal aorta. There is a 7.7 cm infrarenal abdominal aortic aneurysm status post endovascular stent graft repair. The excluded aneurysm sac has increased in size since  the prior CT (previously measuring approximately 7 cm in diameter. Evaluation is limited as the precontrast and delayed images of the abdomen are not performed. Faint high attenuating focus within the sac at 8 o'clock position (228/11) may be chronic. An endoleak is not excluded. Celiac: Atherosclerotic calcification of the origin of the celiac trunk. The celiac artery and its major branches are patent. SMA: Atherosclerotic calcification of the origin of the SMA. The SMA is patent. Renals: Atherosclerotic calcification of the origins of the renal arteries. The renal arteries are patent. IMA: The origin IMA is not visualized, likely occluded. Inflow: The iliac arteries are patent. No aneurysmal dilatation or dissection. Veins: No obvious venous abnormality within the limitations of this arterial phase study. Review of the MIP images confirms the above findings. NON-VASCULAR No intra-abdominal free air or free fluid. Hepatobiliary: The liver is unremarkable. Mild biliary dilatation, post cholecystectomy. Pancreas: Unremarkable. No pancreatic ductal dilatation or surrounding inflammatory changes. Spleen: Normal in size without focal abnormality. Adrenals/Urinary Tract: The right adrenal glands unremarkable. There is a 2.6 cm left adrenal myelolipoma. Mild right renal parenchyma atrophy. Bilateral renal inferior pole scarring. There is no hydronephrosis on either side. The urinary bladder is grossly unremarkable. Stomach/Bowel: There is sigmoid diverticulosis. There is no bowel obstruction or active inflammation. The appendix is normal. Lymphatic: No adenopathy. Reproductive: The prostate is grossly remarkable Other: Right paraspinal hematoma extends from L2-L5 along the right psoas muscle and measures approximately 3.5 x 6 cm in greatest axial dimensions. Musculoskeletal: Osteopenia with degenerative changes of the spine. Minimally displaced triangular fracture of the anterior superior L2 with extension of the fracture  into the L2 superior endplate. L2-L3 disc spacer and posterior fusion. Review of the MIP images confirms the above findings. IMPRESSION: 1. No acute intrathoracic pathology. 2. A 7.7 cm infrarenal abdominal aortic aneurysm status post prior endovascular stent graft repair. The excluded aneurysm sac has increased in size since the prior CT. An endoleak is not excluded. Vascular surgery consult is advised. 3. Minimally displaced triangular fracture of the anterior superior L2 with extension of the fracture into the L2 superior endplate. 4. Right paraspinal hematoma extends from L2-L5 along the right psoas muscle and measures approximately 3.5 x 6 cm in greatest axial dimensions. 5. Sigmoid diverticulosis. No bowel obstruction. Normal appendix. 6.  Aortic Atherosclerosis (ICD10-I70.0). Electronically Signed   By: Vanetta Chou M.D.   On: 07/31/2024 18:49   CT L-SPINE NO CHARGE Result Date: 07/31/2024 CLINICAL DATA:  Fall.  Trauma to the back.  Back pain. EXAM: CT  THORACIC AND LUMBAR SPINE WITHOUT CONTRAST TECHNIQUE: Multidetector CT imaging of the thoracic and lumbar spine was performed without contrast. Multiplanar CT image reconstructions were also generated. RADIATION DOSE REDUCTION: This exam was performed according to the departmental dose-optimization program which includes automated exposure control, adjustment of the mA and/or kV according to patient size and/or use of iterative reconstruction technique. COMPARISON:  CT abdomen pelvis dated 07/01/2023. FINDINGS: CT THORACIC SPINE FINDINGS Alignment: No acute subluxation. Vertebrae: No acute fracture.  Osteopenia. Paraspinal and other soft tissues: No paraspinal fluid collection hematoma. Disc levels: No acute findings.  Degenerative changes. CT LUMBAR SPINE FINDINGS Segmentation: 5 lumbar type vertebrae. Alignment: No acute subluxation. Vertebrae: Minimally displaced fracture of the anterior corner of the superior endplate of L2. There is extension of  the fracture to the superior endplate. No other acute fracture. There is advanced osteopenia which limits evaluation for fracture. Paraspinal and other soft tissues: Right paraspinal hematoma extends from L2 down to L5 posterior to the right psoas muscle. Disc levels: L2-L3 and L3-L4 disc spacers. There is L2-L3 posterior fusion. Multilevel degenerative changes and facet arthropathy. IMPRESSION: 1. Minimally displaced fracture of the anterior corner of the superior endplate of L2. 2. Right paraspinal hematoma extends from L2 down to L5 posterior to the right psoas muscle. 3. No acute/traumatic thoracic spine pathology. Electronically Signed   By: Vanetta Chou M.D.   On: 07/31/2024 18:35   CT T-SPINE NO CHARGE Result Date: 07/31/2024 CLINICAL DATA:  Fall.  Trauma to the back.  Back pain. EXAM: CT THORACIC AND LUMBAR SPINE WITHOUT CONTRAST TECHNIQUE: Multidetector CT imaging of the thoracic and lumbar spine was performed without contrast. Multiplanar CT image reconstructions were also generated. RADIATION DOSE REDUCTION: This exam was performed according to the departmental dose-optimization program which includes automated exposure control, adjustment of the mA and/or kV according to patient size and/or use of iterative reconstruction technique. COMPARISON:  CT abdomen pelvis dated 07/01/2023. FINDINGS: CT THORACIC SPINE FINDINGS Alignment: No acute subluxation. Vertebrae: No acute fracture.  Osteopenia. Paraspinal and other soft tissues: No paraspinal fluid collection hematoma. Disc levels: No acute findings.  Degenerative changes. CT LUMBAR SPINE FINDINGS Segmentation: 5 lumbar type vertebrae. Alignment: No acute subluxation. Vertebrae: Minimally displaced fracture of the anterior corner of the superior endplate of L2. There is extension of the fracture to the superior endplate. No other acute fracture. There is advanced osteopenia which limits evaluation for fracture. Paraspinal and other soft tissues:  Right paraspinal hematoma extends from L2 down to L5 posterior to the right psoas muscle. Disc levels: L2-L3 and L3-L4 disc spacers. There is L2-L3 posterior fusion. Multilevel degenerative changes and facet arthropathy. IMPRESSION: 1. Minimally displaced fracture of the anterior corner of the superior endplate of L2. 2. Right paraspinal hematoma extends from L2 down to L5 posterior to the right psoas muscle. 3. No acute/traumatic thoracic spine pathology. Electronically Signed   By: Vanetta Chou M.D.   On: 07/31/2024 18:35   CT Maxillofacial Wo Contrast Result Date: 07/31/2024 EXAM: CT OF THE FACE WITHOUT CONTRAST 07/31/2024 05:46:11 PM TECHNIQUE: CT of the face was performed without the administration of intravenous contrast. Multiplanar reformatted images are provided for review. Automated exposure control, iterative reconstruction, and/or weight based adjustment of the mA/kV was utilized to reduce the radiation dose to as low as reasonably achievable. COMPARISON: None available. CLINICAL HISTORY: Facial trauma, blunt. 88 yo male tripped while walking through doorway on Thursday. States hit head on rocking chair, light fixture fell on back. Reports headache.  Reports mid back pain and generalized abd pain. FINDINGS: FACIAL BONES: Mandibular heads are normally positioned. No mandibular fracture. Pterygoid plates and zygomatic arches appear intact. No acute findings nasal bone fracture. No suspicious bone lesion. ORBITS: Globes are intact. No acute traumatic injury. No inflammatory change. SINUSES AND MASTOIDS: Mastoid air cells are clear. Mild mucosal thickening of the sinuses without fluid level or sinus wall fracture. SOFT TISSUES: No acute abnormality. IMPRESSION: 1. No acute facial fracture. Electronically signed by: Luke Bun MD 07/31/2024 06:15 PM EDT RP Workstation: [id number]   DG Knee Complete 4 Views Right Result Date: 07/31/2024 EXAM: 4 OR MORE VIEW(S) XRAY OF THE RIGHT KNEE 07/31/2024  05:48:00 PM COMPARISON: None available. CLINICAL HISTORY: Trauma. Tripped while walking through doorway on Thursday. Bilateral knees and right elbow pain. FINDINGS: BONES AND JOINTS: Right knee replacement with normal alignment. No acute fracture. No focal osseous lesion. No joint dislocation. Trace knee effusion. SOFT TISSUES: Vascular calcifications. IMPRESSION: 1. No acute fracture. 2. Right knee arthroplasty with normal alignment. 3. Trace knee effusion. Electronically signed by: Luke Bun MD 07/31/2024 06:06 PM EDT RP Workstation: [id number]   DG Knee Complete 4 Views Left Result Date: 07/31/2024 EXAM: 4 OR MORE VIEW(S) XRAY OF THE LEFT KNEE 07/31/2024 05:48:00 PM COMPARISON: None available. CLINICAL HISTORY: trauma. Tripped while walking through doorway on thursday. Bilateral knees and right elbow pain. FINDINGS: BONES AND JOINTS: Knee replacement with normal alignment. No definitive acute displaced fracture. No focal osseous lesion. Trace knee effusion. SOFT TISSUES: Vascular calcifications. The soft tissues are unremarkable. IMPRESSION: 1. No acute displaced fracture. 2. Trace knee effusion. 3. Status post knee arthroplasty with normal alignment. Electronically signed by: Luke Bun MD 07/31/2024 06:05 PM EDT RP Workstation: [id number]   DG Elbow Complete Right Result Date: 07/31/2024 EXAM: 3 VIEW(S) XRAY OF THE RIGHT ELBOW COMPARISON: None available. CLINICAL HISTORY: trauma. Tripped while walking through doorway on thursday. Bilateral knees and right elbow pain. FINDINGS: BONES AND JOINTS: No definitive fracture or malalignment. No joint dislocation. No significant effusion. SOFT TISSUES: Soft tissue calcifications at the posterior elbow. Vascular calcifications. IMPRESSION: 1. No acute fracture or malalignment. 2. No significant elbow joint effusion. Electronically signed by: Luke Bun MD 07/31/2024 06:03 PM EDT RP Workstation: [id number]   CT Head Wo Contrast Result Date:  07/31/2024 EXAM: CT HEAD WITHOUT CONTRAST 07/31/2024 05:46:11 PM TECHNIQUE: CT of the head was performed without the administration of intravenous contrast. Automated exposure control, iterative reconstruction, and/or weight based adjustment of the mA/kV was utilized to reduce the radiation dose to as low as reasonably achievable. COMPARISON: None available. CLINICAL HISTORY: Head trauma, intracranial venous injury suspected. 88 yo male tripped while walking through doorway on Thursday. States hit head on rocking chair, light fixture fell on back. Reports headache. Reports mid back pain and generalized abd pain. FINDINGS: BRAIN AND VENTRICLES: No acute territorial infarction, hemorrhage, or intracranial mass. Atrophy and mild chronic small vessel ischemic changes of the white matter. Nonenlarged ventricles. No extra-axial collection. Multiple small chronic cerebellar infarcts. Small chronic left frontal lobe infarct near the vertex. Small chronic infarct within the medial left temporal lobe, sagittal series 10 image 48. Chronic lacunar infarcts within the thalamus. Vertebral and carotid vascular calcification. ORBITS: No acute abnormality. SINUSES: No acute abnormality. SOFT TISSUES AND SKULL: No acute soft tissue abnormality. No skull fracture. IMPRESSION: 1. No acute intracranial abnormality. 2. Atrophy with mild chronic small vessel ischemic disease. 3. Multifocal chronic-appearing infarcts as described above. Electronically signed by: Luke Bun MD 07/31/2024 06:02  PM EDT RP Workstation: [id number]   CT Cervical Spine Wo Contrast Result Date: 07/31/2024 EXAM: CT CERVICAL SPINE WITHOUT CONTRAST 07/31/2024 05:46:11 PM TECHNIQUE: CT of the cervical spine was performed without the administration of intravenous contrast. Multiplanar reformatted images are provided for review. Automated exposure control, iterative reconstruction, and/or weight based adjustment of the mA/kV was utilized to reduce the radiation  dose to as low as reasonably achievable. COMPARISON: CT cervical spine 02/08/2011. CLINICAL HISTORY: Neck trauma (Age >= 65y). 88 yo male tripped while walking through doorway on Thursday. States hit head on rocking chair, light fixture fell on back. Reports headache. Reports mid back pain and generalized abd pain. FINDINGS: CERVICAL SPINE: BONES AND ALIGNMENT: Small focal lucency involving the left lateral mass of C2, present on the 2012 study, possibly a nutrient vessel foramen. Anterior cervical fusion hardware at C4-C5 and C6-C7. Posterior instrumented fusion from C6 to T1. Solid osseous fusion from C4 to C7 levels. No solid osseous fusion demonstrated across the C7-T1 level. Hardware is intact. No compression fracture or displaced fracture demonstrated within the cervical spine. Sequelae of laminectomy at C7-T1. DEGENERATIVE CHANGES: Atherosclerosis at the carotid bifurcations. Degenerative endplate osteophytes at multiple levels. Disc bulge at C2-C3 resulting in at least mild spinal canal stenosis. Disc osteophyte complex at C3-C4 results in mild to moderate spinal canal stenosis. Facet arthrosis and uncovertebral hypertrophy at multiple levels resulting in significant foraminal stenosis, most pronounced at C3-C4. SOFT TISSUES: No prevertebral soft tissue swelling. IMPRESSION: 1. No acute abnormality of the cervical spine related to reported neck trauma. 2. Degenerative changes as above. 3. Anterior cervical fusion hardware at C4-5 and C6-7, with additional posterior instrumented fusion from C6 to T1. Solid osseous fusion from C4 to C7 levels. No solid osseous fusion demonstrated across the C7-T1 level. Hardware is intact. Electronically signed by: Donnice Mania MD 07/31/2024 05:59 PM EDT RP Workstation: [id number]     Procedures   CRITICAL CARE Performed by: Alm VEAR Cave   Total critical care time: 39 minutes  Critical care time was exclusive of separately billable procedures and treating other  patients.  Critical care was necessary to treat or prevent imminent or life-threatening deterioration.  Critical care was time spent personally by me on the following activities: development of treatment plan with patient and/or surrogate as well as nursing, discussions with consultants, evaluation of patient's response to treatment, examination of patient, obtaining history from patient or surrogate, ordering and performing treatments and interventions, ordering and review of laboratory studies, ordering and review of radiographic studies, pulse oximetry and re-evaluation of patient's condition.   Medications Ordered in the ED  HYDROmorphone  (DILAUDID ) injection 1 mg (1 mg Intravenous Given 07/31/24 1610)  ondansetron  (ZOFRAN ) injection 4 mg (4 mg Intravenous Given 07/31/24 1609)  sodium chloride  0.9 % bolus 1,000 mL (0 mLs Intravenous Stopped 07/31/24 1900)  iohexol  (OMNIPAQUE ) 350 MG/ML injection 80 mL (80 mLs Intravenous Contrast Given 07/31/24 1704)  HYDROmorphone  (DILAUDID ) injection 1 mg (1 mg Intravenous Given 07/31/24 1900)                                    Medical Decision Making JAYDRIAN CORPENING is a 88 y.o. male here presenting with fall.  Patient had mechanical fall several days ago.  Patient has severe back pain and abdominal pain.  Patient also has a aortic stent.  Plan to get a dissection study as well as trauma scan and  recons of the lumbar and thoracic spine.  Will also get extremity x-rays and give pain meds and reassess  6:50 pm I reviewed patient's labs and independently interpreted imaging studies.  CBC is stable and CMP unremarkable.  CT chest abdomen pelvis showed 7.7 infrarenal aortic aneurysm.  Patient also has L2 fracture and paraspinal hematoma as well as psoas hematoma.  I discussed with Dr. Serene initially regarding the possible endoleak.  He reviewed the images and states that patient already had coiling from interventional radiology and this is not  significantly larger than previous.  He does not think that the endoleak is causing his symptoms.  He states that the psoas hematoma and spinal fracture is likely causing his symptoms.  He recommend neurosurgery consult  7:30 PM I discussed with Dr. Darnella from neurosurgery.  He states that he will see the patient as a consult.  Also consulted trauma  7:40 PM Discussed with Dr. Polly from trauma who will admit to med/surg under trauma service   Amount and/or Complexity of Data Reviewed Labs: ordered. Radiology: ordered.  Risk Prescription drug management. Decision regarding hospitalization.     Final diagnoses:  None    ED Discharge Orders     None          Patt Alm Macho, MD 07/31/24 [phone]

## 2024-07-31 NOTE — ED Notes (Signed)
 Present nurse was called to room, pt complaint of a sudden sharp pain on his back. Patt MD notified

## 2024-07-31 NOTE — ED Triage Notes (Signed)
 Tripped while walking through doorway on thursday. States hit head on rocking chair, light fixture fell on back. Takes 81 mg aspirin  daily.  Reports headache. Denies N/V, dizziness, chest pain, SHOB Reports mid back pain and generalized abd pain.

## 2024-07-31 NOTE — ED Notes (Signed)
Pt awake and alert at this time.  

## 2024-07-31 NOTE — ED Notes (Signed)
 Immediately after valium  was pushed, pt start snoring, Spo2 80%, and was hard to arouse. Dr Patt was called to bedside, Pt was placed on [place] 6 L.

## 2024-08-01 ENCOUNTER — Encounter (HOSPITAL_COMMUNITY): Payer: Self-pay

## 2024-08-01 DIAGNOSIS — Z833 Family history of diabetes mellitus: Secondary | ICD-10-CM | POA: Diagnosis not present

## 2024-08-01 DIAGNOSIS — D649 Anemia, unspecified: Secondary | ICD-10-CM | POA: Diagnosis present

## 2024-08-01 DIAGNOSIS — Z96653 Presence of artificial knee joint, bilateral: Secondary | ICD-10-CM | POA: Diagnosis present

## 2024-08-01 DIAGNOSIS — Z981 Arthrodesis status: Secondary | ICD-10-CM | POA: Diagnosis not present

## 2024-08-01 DIAGNOSIS — T1490XA Injury, unspecified, initial encounter: Secondary | ICD-10-CM | POA: Diagnosis present

## 2024-08-01 DIAGNOSIS — I480 Paroxysmal atrial fibrillation: Secondary | ICD-10-CM | POA: Diagnosis present

## 2024-08-01 DIAGNOSIS — I5022 Chronic systolic (congestive) heart failure: Secondary | ICD-10-CM

## 2024-08-01 DIAGNOSIS — Z87891 Personal history of nicotine dependence: Secondary | ICD-10-CM | POA: Diagnosis not present

## 2024-08-01 DIAGNOSIS — Z79899 Other long term (current) drug therapy: Secondary | ICD-10-CM | POA: Diagnosis not present

## 2024-08-01 DIAGNOSIS — Y832 Surgical operation with anastomosis, bypass or graft as the cause of abnormal reaction of the patient, or of later complication, without mention of misadventure at the time of the procedure: Secondary | ICD-10-CM | POA: Diagnosis present

## 2024-08-01 DIAGNOSIS — N400 Enlarged prostate without lower urinary tract symptoms: Secondary | ICD-10-CM | POA: Diagnosis present

## 2024-08-01 DIAGNOSIS — I2510 Atherosclerotic heart disease of native coronary artery without angina pectoris: Secondary | ICD-10-CM | POA: Diagnosis present

## 2024-08-01 DIAGNOSIS — I255 Ischemic cardiomyopathy: Secondary | ICD-10-CM | POA: Diagnosis present

## 2024-08-01 DIAGNOSIS — I7143 Infrarenal abdominal aortic aneurysm, without rupture: Secondary | ICD-10-CM | POA: Diagnosis not present

## 2024-08-01 DIAGNOSIS — Z8701 Personal history of pneumonia (recurrent): Secondary | ICD-10-CM | POA: Diagnosis not present

## 2024-08-01 DIAGNOSIS — I9789 Other postprocedural complications and disorders of the circulatory system, not elsewhere classified: Secondary | ICD-10-CM | POA: Diagnosis present

## 2024-08-01 DIAGNOSIS — Z955 Presence of coronary angioplasty implant and graft: Secondary | ICD-10-CM | POA: Diagnosis not present

## 2024-08-01 DIAGNOSIS — E1122 Type 2 diabetes mellitus with diabetic chronic kidney disease: Secondary | ICD-10-CM | POA: Diagnosis present

## 2024-08-01 DIAGNOSIS — E785 Hyperlipidemia, unspecified: Secondary | ICD-10-CM | POA: Diagnosis present

## 2024-08-01 DIAGNOSIS — K219 Gastro-esophageal reflux disease without esophagitis: Secondary | ICD-10-CM | POA: Diagnosis present

## 2024-08-01 DIAGNOSIS — E663 Overweight: Secondary | ICD-10-CM | POA: Diagnosis present

## 2024-08-01 DIAGNOSIS — Y92009 Unspecified place in unspecified non-institutional (private) residence as the place of occurrence of the external cause: Secondary | ICD-10-CM | POA: Diagnosis not present

## 2024-08-01 DIAGNOSIS — S32029A Unspecified fracture of second lumbar vertebra, initial encounter for closed fracture: Secondary | ICD-10-CM | POA: Diagnosis present

## 2024-08-01 DIAGNOSIS — I25810 Atherosclerosis of coronary artery bypass graft(s) without angina pectoris: Secondary | ICD-10-CM | POA: Diagnosis present

## 2024-08-01 DIAGNOSIS — Z7951 Long term (current) use of inhaled steroids: Secondary | ICD-10-CM | POA: Diagnosis not present

## 2024-08-01 DIAGNOSIS — N1832 Chronic kidney disease, stage 3b: Secondary | ICD-10-CM | POA: Diagnosis present

## 2024-08-01 DIAGNOSIS — W19XXXA Unspecified fall, initial encounter: Secondary | ICD-10-CM | POA: Diagnosis not present

## 2024-08-01 DIAGNOSIS — I130 Hypertensive heart and chronic kidney disease with heart failure and stage 1 through stage 4 chronic kidney disease, or unspecified chronic kidney disease: Secondary | ICD-10-CM | POA: Diagnosis present

## 2024-08-01 DIAGNOSIS — Z8249 Family history of ischemic heart disease and other diseases of the circulatory system: Secondary | ICD-10-CM | POA: Diagnosis not present

## 2024-08-01 DIAGNOSIS — W01198A Fall on same level from slipping, tripping and stumbling with subsequent striking against other object, initial encounter: Secondary | ICD-10-CM | POA: Diagnosis present

## 2024-08-01 LAB — CBC
HCT: 28.4 % — ABNORMAL LOW (ref 39.0–52.0)
Hemoglobin: 9.5 g/dL — ABNORMAL LOW (ref 13.0–17.0)
MCH: 31.9 pg (ref 26.0–34.0)
MCHC: 33.5 g/dL (ref 30.0–36.0)
MCV: 95.3 fL (ref 80.0–100.0)
Platelets: 204 K/uL (ref 150–400)
RBC: 2.98 MIL/uL — ABNORMAL LOW (ref 4.22–5.81)
RDW: 15.1 % (ref 11.5–15.5)
WBC: 6.8 K/uL (ref 4.0–10.5)
nRBC: 0 % (ref 0.0–0.2)

## 2024-08-01 LAB — BASIC METABOLIC PANEL WITH GFR
Anion gap: 9 (ref 5–15)
BUN: 24 mg/dL — ABNORMAL HIGH (ref 8–23)
CO2: 24 mmol/L (ref 22–32)
Calcium: 9.3 mg/dL (ref 8.9–10.3)
Chloride: 105 mmol/L (ref 98–111)
Creatinine, Ser: 1.61 mg/dL — ABNORMAL HIGH (ref 0.61–1.24)
GFR, Estimated: 41 mL/min — ABNORMAL LOW (ref >60)
Glucose, Bld: 103 mg/dL — ABNORMAL HIGH (ref 70–99)
Potassium: 4.7 mmol/L (ref 3.5–5.1)
Sodium: 138 mmol/L (ref 135–145)

## 2024-08-01 LAB — GLUCOSE, CAPILLARY
Glucose-Capillary: 102 mg/dL — ABNORMAL HIGH (ref 70–99)
Glucose-Capillary: 118 mg/dL — ABNORMAL HIGH (ref 70–99)
Glucose-Capillary: 146 mg/dL — ABNORMAL HIGH (ref 70–99)
Glucose-Capillary: 111 mg/dL — ABNORMAL HIGH (ref 70–99)
Glucose-Capillary: 94 mg/dL (ref 70–99)

## 2024-08-01 MED ORDER — MORPHINE SULFATE (PF) 2 MG/ML IV SOLN
2.0000 mg | INTRAVENOUS | Status: DC | PRN
  Administered 2024-08-01 – 2024-08-03 (×3): 2 mg via INTRAVENOUS
  Filled 2024-08-01 (×3): qty 1

## 2024-08-01 MED ORDER — ONDANSETRON 4 MG PO TBDP: 4.0000 mg | ORAL_TABLET | Freq: Four times a day (QID) | ORAL | Status: DC | PRN

## 2024-08-01 MED ORDER — METHOCARBAMOL 500 MG PO TABS
500.0000 mg | ORAL_TABLET | Freq: Three times a day (TID) | ORAL | Status: DC
  Administered 2024-08-01 – 2024-08-05 (×11): 500 mg via ORAL
  Filled 2024-08-01 (×12): qty 1

## 2024-08-01 MED ORDER — DOCUSATE SODIUM 100 MG PO CAPS
100.0000 mg | ORAL_CAPSULE | Freq: Two times a day (BID) | ORAL | Status: DC
  Administered 2024-08-01 – 2024-08-05 (×9): 100 mg via ORAL
  Filled 2024-08-01 (×10): qty 1

## 2024-08-01 MED ORDER — INSULIN ASPART 100 UNIT/ML IJ SOLN
0.0000 [IU] | Freq: Three times a day (TID) | INTRAMUSCULAR | Status: DC
  Administered 2024-08-01 – 2024-08-02 (×2): 2 [IU] via SUBCUTANEOUS

## 2024-08-01 MED ORDER — FINASTERIDE 5 MG PO TABS
5.0000 mg | ORAL_TABLET | Freq: Every day | ORAL | Status: DC
  Administered 2024-08-01 – 2024-08-05 (×5): 5 mg via ORAL
  Filled 2024-08-01 (×5): qty 1

## 2024-08-01 MED ORDER — ALBUTEROL SULFATE (2.5 MG/3ML) 0.083% IN NEBU: 3.0000 mL | INHALATION_SOLUTION | Freq: Four times a day (QID) | RESPIRATORY_TRACT | Status: DC | PRN

## 2024-08-01 MED ORDER — METOPROLOL TARTRATE 5 MG/5ML IV SOLN: 5.0000 mg | Freq: Four times a day (QID) | INTRAVENOUS | Status: DC | PRN

## 2024-08-01 MED ORDER — TRAMADOL HCL 50 MG PO TABS: 25.0000 mg | ORAL_TABLET | Freq: Four times a day (QID) | ORAL | Status: DC | PRN

## 2024-08-01 MED ORDER — LACTATED RINGERS IV SOLN: INTRAVENOUS | Status: DC

## 2024-08-01 MED ORDER — HYDRALAZINE HCL 20 MG/ML IJ SOLN: 10.0000 mg | INTRAMUSCULAR | Status: DC | PRN

## 2024-08-01 MED ORDER — PANTOPRAZOLE SODIUM 40 MG PO TBEC
40.0000 mg | DELAYED_RELEASE_TABLET | Freq: Every day | ORAL | Status: DC
  Administered 2024-08-01 – 2024-08-05 (×5): 40 mg via ORAL
  Filled 2024-08-01 (×5): qty 1

## 2024-08-01 MED ORDER — METHOCARBAMOL 1000 MG/10ML IJ SOLN
500.0000 mg | Freq: Three times a day (TID) | INTRAMUSCULAR | Status: DC
  Administered 2024-08-03 – 2024-08-04 (×2): 500 mg via INTRAVENOUS
  Filled 2024-08-01 (×4): qty 10

## 2024-08-01 MED ORDER — ACETAMINOPHEN 500 MG PO TABS
1000.0000 mg | ORAL_TABLET | Freq: Four times a day (QID) | ORAL | Status: DC
  Administered 2024-08-01 – 2024-08-02 (×6): 1000 mg via ORAL
  Filled 2024-08-01 (×8): qty 2

## 2024-08-01 MED ORDER — INSULIN ASPART 100 UNIT/ML IJ SOLN: 0.0000 [IU] | Freq: Every day | INTRAMUSCULAR | Status: DC

## 2024-08-01 MED ORDER — POLYETHYLENE GLYCOL 3350 17 G PO PACK: 17.0000 g | PACK | Freq: Every day | ORAL | Status: DC | PRN

## 2024-08-01 MED ORDER — ONDANSETRON HCL 4 MG/2ML IJ SOLN: 4.0000 mg | Freq: Four times a day (QID) | INTRAMUSCULAR | Status: DC | PRN

## 2024-08-01 NOTE — Progress Notes (Signed)
 Transition of Care [facility]) - CAGE-AID Screening   Patient Details  Name: [name] MRN: [id number] Date of Birth: 1936-04-30  Transition of Care [facility] CM/SW Contact:    Bernardino Mayotte, RN Phone Number: 08/01/2024, 8:28 PM   Clinical Narrative:  Patient endorses alcohol use 7-14 standard drinks per week. Denies needs for resources at this time.  CAGE-AID Screening:    Have You Ever Felt You Ought to Cut Down on Your Drinking or Drug Use?: Yes Have People Annoyed You By Critizing Your Drinking Or Drug Use?: No Have You Felt Bad Or Guilty About Your Drinking Or Drug Use?: No Have You Ever Had a Drink or Used Drugs First Thing In The Morning to Steady Your Nerves or to Get Rid of a Hangover?: No CAGE-AID Score: 1  Substance Abuse Education Offered: No

## 2024-08-01 NOTE — Plan of Care (Signed)

## 2024-08-01 NOTE — Consult Note (Signed)
 Reason for Consult: L2 fracture Referring Physician: Hospitalist  [name] is an 88 y.o. male.   HPI:  88 year old gentleman who is an old patient of mine whose had multiple lumbar spine surgeries, who fell last Thursday.  He is had significant back pain since that time and the family brought him to emergency department yesterday for continued back pain.  He denies leg pain or numbness tingling or weakness.  He does describe poor balance and multiple falls, up to 10 or 11 falls.  The back pain is severe with movement.  CT scan showed a small chip fracture at the superior anterior endplate of L2 without loss of vertebral body height or retropulsion or kyphosis and neurosurgical evaluation was requested  Past Medical History:  Diagnosis Date   Adenomatous colon polyp    Ankle edema    Chronic   Asthma    CAD S/P percutaneous coronary angioplasty 2004   a) '04: Staged Taxus DES PCI to RCA (2 Taxus 2.75 x 32 & 12) and Cx-OM (Taxus 3 x 20);;'07 - PCI to pRCA ISR- Cypher DES; c) 05/2014: pPCI pRCA stentISR (Promus DES 3 x 8) OM1 distal to stent (Promus DES 2.75 x 12 - 3.1); 11/'22: distal LCx stent edge 99% -> Onyx Frontier DES 2.5 x 15 - 2.8 (overlaps prior stent)   CAD, multiple vessel 1985   Most recent July 2018: Admitted for non-STEMI. Occluded LAD with patent LIMA (SP1 now occluded).  Patent stents in proximal and mid RCA as well as circumflex-OM 3. Known occlusion of SVG-OM. EF 45-50%   CHF (congestive heart failure) (HCC)    Cholelithiasis - with cholangitis & choledocholithiasis    status post ERCP with removal of calculi and biliary stent placement.   Chronic anemia    On iron supplement; history of positive guaiac - negative colonoscopy in 1996.; Thought to be related to hemorrhoids; status post hemorrhoidectomy   Chronic back pain     multiple surgeries; C-spine and lumbar   Diabetes mellitus    no meds- pt states he is no longer DM   Diverticulitis of colon 1996    Diverticulosis    Dyslipidemia, goal LDL below 70    Erectile dysfunction    Exertional dyspnea    Chronic baseline SOB with ambulation   GERD (gastroesophageal reflux disease)    Grade II diastolic dysfunction    H/O: pneumonia 11/2012   Hemorrhoids    Hiatal hernia    History of: ST elevation myocardial infarction (STEMI) involving left circumflex coronary artery with complication 1985   PTCA-circumflex; PCI in 1991   Hypertension    Moderate aortic stenosis by prior echocardiogram 07/2015   Normal LV function - EF 55-60%.. Abnormal relaxation. Mild-moderate aortic stenosis (peak/mean gradient 18/10 mmH)   Osteoarthritis of both knees    And back; multiple back surgeries, right knee arthroplasty and left knee arthroscopic surgery x2   Pneumonia 2016   S/P AAA (abdominal aortic aneurysm) repair 08/30/2012   s/p EVAR   S/P CABG x 2 1997   LIMA-LAD, SVG-OM   Sleep apnea    pt. states he was told to return for f/u, to be fitted for Cpap, but pt. reports that he didn't follow up-no cpap used   Statin myopathy 09/30/2015    Past Surgical History:  Procedure Laterality Date   Abdominal and Lower Extremity Arterial Ultrasound  08/23/2012; 10/10/2013   Normal ABIs. Nonocclusive lower extremity disease. 4.2 cm x 4.3 cm infrarenal AAA;; 4.4 cm  x 4.3 cm (essentially stable)    ABDOMINAL AORTIC ENDOVASCULAR STENT GRAFT N/A 08/13/2015   Procedure: ABDOMINAL AORTIC ENDOVASCULAR STENT GRAFT;  Surgeon: Lynwood JONETTA Collum, MD;  Location: [facility] OR;  Service: Vascular;  Laterality: N/A;   Anterior cervical plating  04/23/2010   At C4-5 and a C6-7 utilizing two separate Biomet MaxAn plates.   ANTERIOR LAT LUMBAR FUSION Left 11/22/2012   Procedure: ANTERIOR LATERAL LUMBAR FUSION 1 LEVEL;  Surgeon: Alm GORMAN Molt, MD;  Location: [facility] NEURO ORS;  Service: Neurosurgery;  Laterality: Left;  Anterior Lateral Lumbar Fusion Lumbar Three-Four   BACK SURGERY  1979 & x 10   pt. remarks, I have had about 10 back  surgeries   BILIARY STENT PLACEMENT N/A 07/03/2014   Procedure: BILIARY STENT PLACEMENT;  Surgeon: Lamar JONETTA Aho, MD;  Location: [facility] ENDOSCOPY;  Service: Endoscopy;  Laterality: N/A;   CARPAL TUNNEL RELEASE Left 11/16/2019   Procedure: LEFT CARPAL TUNNEL RELEASE;  Surgeon: Vernetta Lonni GRADE, MD;  Location: [facility] ORS;  Service: Orthopedics;  Laterality: Left;   CARPAL TUNNEL RELEASE Right 01/10/2023   Procedure: RIGHT CARPAL TUNNEL RELEASE;  Surgeon: Molt Alm GORMAN, MD;  Location: [facility] OR;  Service: Neurosurgery;  Laterality: Right;   CATARACT EXTRACTION     Cervical arthrodesis  04/23/2010   Anterior cervical arthrodesis, C4-5, C6-7 utilizing 7-mm PEEK interbody cage packed with local autograft & Antifuse putty at C4-5 & an 8-mm cage at C6-7.   CERVICAL DISCECTOMY  04/23/2010   Decompressive anterior carvical diskectomy. C4-5, C6-7   CHOLECYSTECTOMY N/A 05/23/2015   Procedure: LAPAROSCOPIC CHOLECYSTECTOMY WITH INTRAOPERATIVE CHOLANGIOGRAM;  Surgeon: Alm Angle, MD;  Location: [facility] ORS;  Service: General;  Laterality: N/A;   COLONOSCOPY  1996   CORONARY ANGIOPLASTY  1985,1991,15   1985 lateral STEMI Circumflex PTCA;    CORONARY ARTERY BYPASS GRAFT  1997   LIMA-LAD, SVG-OM (SVG known to be occluded prior to 2004)   CORONARY STENT INTERVENTION N/A 08/20/2021   Procedure: CORONARY STENT INTERVENTION;  Surgeon: Court Dorn PARAS, MD;  Location: [facility] INVASIVE CV LAB;  Service: Cardiovascular:  dLCx distal Edge 99% => Onyx Frontier DES 2.5 x 15 (2.8 mm) overlaps prior stent distally.   ERCP N/A 07/03/2014   Procedure: ENDOSCOPIC RETROGRADE CHOLANGIOPANCREATOGRAPHY (ERCP);  Surgeon: Lamar JONETTA Aho, MD;  Location: [facility] ENDOSCOPY;  Service: Endoscopy;  Laterality: N/A;   ERCP N/A 07/05/2014   Procedure: ENDOSCOPIC RETROGRADE CHOLANGIOPANCREATOGRAPHY (ERCP);  Surgeon: Lamar JONETTA Aho, MD;  Location: [facility] ENDOSCOPY;  Service: Endoscopy;  Laterality: N/A;   ERCP N/A 06/30/2015   Procedure: ENDOSCOPIC  RETROGRADE CHOLANGIOPANCREATOGRAPHY (ERCP);  Surgeon: Gwendlyn ONEIDA Buddy, MD;  Location: [facility];  Service: Endoscopy;  Laterality: N/A;   EYE SURGERY     IR ANGIOGRAM EXTREMITY LEFT  01/18/2017   IR ANGIOGRAM PELVIS SELECTIVE OR SUPRASELECTIVE  01/18/2017   IR ANGIOGRAM SELECTIVE EACH ADDITIONAL VESSEL  01/18/2017   IR AORTAGRAM ABDOMINAL SERIALOGRAM  01/18/2017   IR EMBO ARTERIAL NOT HEMORR HEMANG INC GUIDE ROADMAPPING  01/18/2017   IR RADIOLOGIST EVAL & MGMT  01/04/2017   IR RADIOLOGIST EVAL & MGMT  10/26/2017   IR RADIOLOGIST EVAL & MGMT  05/24/2018   IR RADIOLOGIST EVAL & MGMT  06/19/2019   IR RADIOLOGIST EVAL & MGMT  06/11/2020   IR RADIOLOGIST EVAL & MGMT  07/02/2021   IR RADIOLOGIST EVAL & MGMT  06/22/2022   IR RADIOLOGIST EVAL & MGMT  07/08/2023   IR US  GUIDE VASC ACCESS RIGHT  01/18/2017  KNEE ARTHROSCOPY Left    x 2   LEFT HEART CATH AND CORS/GRAFTS ANGIOGRAPHY  06/2003   None Occluded vein graft to OM; diffuse RCA disease in the mid vessel, 80% circumflex-OM stenosis; follow on AV groove circumflex with sequential 90% stenoses and intervening saccular dilation    LEFT HEART CATH AND CORS/GRAFTS ANGIOGRAPHY  12/2008   4 abnormal Myoview  showing apical thinning (possibly due to apical LAD 95%) : 100% Occluded LAD after SV1, distal LAD grafted via LIMA -apical 95% . Cx -OM1 w/patent stent extending into OM 1 . Follow on Cx - 70-80% - non-amenable PCI. RCA widely patent 3 overlapping stents in mRCA w/less than 40% stenosisin RPL; SVG-OM known occluded    LEFT HEART CATH AND CORS/GRAFTS ANGIOGRAPHY N/A 04/20/2017   Procedure: Left Heart Cath and Cors/Grafts Angiography;  Surgeon: Court Dorn PARAS, MD;  Location: [facility] INVASIVE CV LAB;  LAD now occluded prior to SP1.LIMA-LAD. Patent RCA and circumflex stents. EF 45-50%. Relatively stable.    LEFT HEART CATH AND CORS/GRAFTS ANGIOGRAPHY N/A 08/20/2021   Procedure: LEFT HEART CATH AND CORS/GRAFTS ANGIOGRAPHY;  Surgeon: Court Dorn PARAS,  MD;  Location: [facility] INVASIVE CV LAB;  Service: Cardiovascular: CULPRIT 99% dLCx just after stent (Overlapping DES PCI) - otw patent LCx stent, patent RCA stent & Patent LIMA-LAD with CTO of prox LAD.   LEFT HEART CATHETERIZATION WITH CORONARY ANGIOGRAM N/A 05/15/2014   Procedure: LEFT HEART CATHETERIZATION WITH CORONARY ANGIOGRAM;  Surgeon: Victory LELON Claudene DOUGLAS, MD;  Location: [facility] CATH LAB;  Service: Cardiovascular;  Laterality: N/A;   LEFT HEART CATHETERIZATION WITH CORONARY/GRAFT ANGIOGRAM N/A 06/28/2014   Procedure: LEFT HEART CATHETERIZATION WITH EL BILE;  Surgeon: Debby DELENA Sor, MD;  Location: [facility] CATH LAB;  Service: Cardiovascular;  Laterality: N/A;   LUMBAR PERCUTANEOUS PEDICLE SCREW 1 LEVEL N/A 11/22/2012   Procedure: LUMBAR PERCUTANEOUS PEDICLE SCREW 1 LEVEL;  Surgeon: Alm GORMAN Molt, MD;  Location: [facility] NEURO ORS;  Service: Neurosurgery;  Laterality: N/A;  Lumbar Three-Four Percutaneous Pedicle Screw, Lateral approach   MINOR HEMORRHOIDECTOMY     NM MYOVIEW  LTD  05/26/2021   Lexiscan : Intermediate risk (similar findings to last study), with exception of reduced EF roughly 40%..  Prior basal inferior-inferolateral infarct without peri-infarct ischemia.   ->  Echo ordered -> (EF 45 to 50%)-when compared to prior Myoview , infarct appears similar.   PERCUTANEOUS CORONARY STENT INTERVENTION (PCI-S)  06/2003   PCI - RCA 2 overlapping Taxus DES 2.75 mm x 32 mm and 2.75 mm x 12 mm (3.0 mm); PCI-Cx-OM1 - Taxus DES 3.0 mm x 20 mm (3.1 mm);    PERCUTANEOUS CORONARY STENT INTERVENTION (PCI-S)  12/2005   80% ISR in proximal Taxus stent in RCA -- covered proximally with Cypher DES 3.0 mm x 12 mm   PERCUTANEOUS CORONARY STENT INTERVENTION (PCI-S) N/A 05/17/2014   Procedure: PERCUTANEOUS CORONARY STENT INTERVENTION (PCI-S);  Surgeon: Victory LELON Claudene DOUGLAS, MD;  Location: [facility] CATH LAB: PCI pRCA stent ISR - Promus DES 3.0 x 8 (3.25), OM distal stent edge - Promus DES 2.75 x 12 (3.1)   PERIPHERAL VASCULAR  CATHETERIZATION  11/03/2016   Procedure: Embolization;  Surgeon: Penne Lonni Colorado, MD;  Location: [facility] INVASIVE CV LAB;  Service: Cardiovascular;;   POSTERIOR CERVICAL FUSION/FORAMINOTOMY N/A 05/02/2013   Procedure: POSTERIOR CERVICAL FUSION/FORAMINOTOMY CERVICAL SEVEN THORACIC-ONE;  Surgeon: Alm GORMAN Molt, MD;  Location: [facility] NEURO ORS;  Service: Neurosurgery;  Laterality: N/A;  POSTERIOR CERVICAL FUSION/FORAMINOTOMY CERVICAL SEVEN THORACIC-ONE   SPHINCTEROTOMY N/A 06/30/2015  Procedure: SPHINCTEROTOMY;  Surgeon: Gwendlyn ONEIDA Buddy, MD;  Location: [facility];  Service: Endoscopy;  Laterality: N/A;   TOTAL KNEE ARTHROPLASTY Right    TOTAL KNEE ARTHROPLASTY Left 12/20/2016   Procedure: LEFT TOTAL KNEE ARTHROPLASTY;  Surgeon: Marcey Raman, MD;  Location: [facility] OR;  Service: Orthopedics;  Laterality: Left;   TRANSTHORACIC ECHOCARDIOGRAM  06/10/2021   EF 45 to 50%.  Mildly reduced function.  Mild hypokinesis of basal-mid inferior and inferolateral wall consistent with prior infarct.  Severe LA dilation.  Mild to moderate MR with moderate MAC.  Mild calcific AS (mean gradient 11 mmHg) --> LV function seems unchanged.  Diastolic function improved.  Wall motion abnormality unchanged.  AS unchanged.   TRANSTHORACIC ECHOCARDIOGRAM  07/2018   Normal LV size.  EF 50 to 55% with mild HK of inferolateral wall.  GRII DD.  Mild AS with mean gradient 15 mm or greater.  Mild ascending aortic dilation.  Mildly increased PA pressures of 41 mmHg   UPPER GI ENDOSCOPY  07/03/2019   VISCERAL ANGIOGRAM  11/03/2016   Procedure: Visceral Angiogram;  Surgeon: Penne Lonni Colorado, MD;  Location: [facility] INVASIVE CV LAB;  Service: Cardiovascular;;    Allergies  Allergen Reactions   Roxicodone  [Oxycodone ] Shortness Of Breath and Cough   Statins Other (See Comments)    Myalgias     Zestril [Lisinopril] Cough   Eliquis  [Apixaban ] Other (See Comments)    Blood clots in urine   Welchol [Colesevelam Hcl] Itching    Social  History   Tobacco Use   Smoking status: Former    Current packs/day: 0.00    Average packs/day: 3.0 packs/day for 45.0 years (135.0 ttl pk-yrs)    Types: Cigarettes    Start date: 10/04/1938    Quit date: 10/05/1983    Years since quitting: 40.8   Smokeless tobacco: Never  Substance Use Topics   Alcohol use: Yes    Alcohol/week: 7.0 - 14.0 standard drinks of alcohol    Types: 7 - 14 Cans of beer per week    Comment: 1-2 beers per day    Family History  Problem Relation Age of Onset   Arthritis Mother    Diabetes Father    Heart disease Father    Stroke Sister    Hypertension Sister    Heart disease Sister    Diabetes Sister    Breast cancer Sister    Heart disease Brother    Ulcers Brother    Colon cancer Neg Hx      Review of Systems  Positive ROS: Negative other than change in balance  All other systems have been reviewed and were otherwise negative with the exception of those mentioned in the HPI and as above.  Objective: Vital signs in last 24 hours: Temp:  [97.5 F (36.4 C)-98.3 F (36.8 C)] 98.3 F (36.8 C) (10/29 1004) Pulse Rate:  [43-82] 82 (10/29 1004) Resp:  [12-18] 16 (10/29 1004) BP: (120-145)/(60-88) 124/60 (10/29 1004) SpO2:  [93 %-100 %] 99 % (10/29 1004)  General Appearance: Alert, cooperative, no distress, appears stated age Head: Normocephalic, without obvious abnormality, atraumatic Eyes: PERRL, conjunctiva/corneas clear, EOM's intact     Throat: benign Neck: Supple, symmetrical, trachea midline Back: Symmetric, no curvature, ROM normal, no CVA tenderness Lungs: respirations unlabored Heart: Regular rate and rhythm Abdomen: Soft, non-tender Extremities: Extremities normal, atraumatic, no cyanosis or edema Pulses: 2+ and symmetric all extremities Skin: Skin color, texture, turgor normal, no rashes or lesions  NEUROLOGIC:   Mental  status: A&O x4, no aphasia, good attention span, Memory and fund of knowledge appear to be appropriate Motor  Exam - grossly normal, normal tone and bulk Sensory Exam - grossly normal Reflexes: symmetric, no pathologic reflexes, No Hoffman's, No clonus Coordination - grossly normal Gait -not tested Balance -not tested Cranial Nerves: I: smell Not tested  II: visual acuity  OS: na  OD: na  II: visual fields Full to confrontation  II: pupils Equal, round, reactive to light  III,VII: ptosis None  III,IV,VI: extraocular muscles  Full ROM  V: mastication Normal  V: facial light touch sensation  Normal  V,VII: corneal reflex  Present  VII: facial muscle function - upper  Normal  VII: facial muscle function - lower Normal  VIII: hearing Not tested  IX: soft palate elevation  Normal  IX,X: gag reflex Present  XI: trapezius strength  5/5  XI: sternocleidomastoid strength 5/5  XI: neck flexion strength  5/5  XII: tongue strength  Normal    Data Review Lab Results  Component Value Date   WBC 6.8 08/01/2024   HGB 9.5 (L) 08/01/2024   HCT 28.4 (L) 08/01/2024   MCV 95.3 08/01/2024   PLT 204 08/01/2024   Lab Results  Component Value Date   NA 138 08/01/2024   K 4.7 08/01/2024   CL 105 08/01/2024   CO2 24 08/01/2024   BUN 24 (H) 08/01/2024   CREATININE 1.61 (H) 08/01/2024   GLUCOSE 103 (H) 08/01/2024   Lab Results  Component Value Date   INR 1.1 12/29/2022    Radiology: CT Angio Chest/Abd/Pel for Dissection W and/or Wo Contrast Result Date: 07/31/2024 CLINICAL DATA:  Acute aortic syndrome suspected.  Fall. EXAM: CT ANGIOGRAPHY CHEST, ABDOMEN AND PELVIS TECHNIQUE: Non-contrast CT of the chest was initially obtained. Multidetector CT imaging through the chest, abdomen and pelvis was performed using the standard protocol during bolus administration of intravenous contrast. Multiplanar reconstructed images and MIPs were obtained and reviewed to evaluate the vascular anatomy. RADIATION DOSE REDUCTION: This exam was performed according to the departmental dose-optimization program which  includes automated exposure control, adjustment of the mA and/or kV according to patient size and/or use of iterative reconstruction technique. CONTRAST:  80mL OMNIPAQUE  IOHEXOL  350 MG/ML SOLN COMPARISON:  CT abdomen pelvis dated 07/01/2023. FINDINGS: CTA CHEST FINDINGS Cardiovascular: Mild cardiomegaly. No pericardial effusion. There is advanced 3 vessel coronary vascular calcification and calcification of the mitral annulus. Moderate atherosclerotic calcification of the thoracic aorta. No abdominal dilatation or dissection. The origins of the great vessels of the aortic arch and the central pulmonary arteries appear patent. Mediastinum/Nodes: No hilar or mediastinal adenopathy. Moderate size hiatal hernia. The esophagus is grossly unremarkable. No mediastinal fluid collection. Lungs/Pleura: Trace right pleural effusion. There is minimal subpleural atelectasis of the right lower lobe. There are bibasilar subpleural linear atelectasis/scarring. No consolidative changes or pneumothorax. The central airways are patent. Musculoskeletal: Osteopenia with degenerative changes of the spine. Median sternotomy wires. No acute osseous pathology. Review of the MIP images confirms the above findings. CTA ABDOMEN AND PELVIS FINDINGS VASCULAR Aorta: Advanced atherosclerotic calcification of the abdominal aorta. There is a 7.7 cm infrarenal abdominal aortic aneurysm status post endovascular stent graft repair. The excluded aneurysm sac has increased in size since the prior CT (previously measuring approximately 7 cm in diameter. Evaluation is limited as the precontrast and delayed images of the abdomen are not performed. Faint high attenuating focus within the sac at 8 o'clock position (228/11) may be chronic. An endoleak is  not excluded. Celiac: Atherosclerotic calcification of the origin of the celiac trunk. The celiac artery and its major branches are patent. SMA: Atherosclerotic calcification of the origin of the SMA. The SMA  is patent. Renals: Atherosclerotic calcification of the origins of the renal arteries. The renal arteries are patent. IMA: The origin IMA is not visualized, likely occluded. Inflow: The iliac arteries are patent. No aneurysmal dilatation or dissection. Veins: No obvious venous abnormality within the limitations of this arterial phase study. Review of the MIP images confirms the above findings. NON-VASCULAR No intra-abdominal free air or free fluid. Hepatobiliary: The liver is unremarkable. Mild biliary dilatation, post cholecystectomy. Pancreas: Unremarkable. No pancreatic ductal dilatation or surrounding inflammatory changes. Spleen: Normal in size without focal abnormality. Adrenals/Urinary Tract: The right adrenal glands unremarkable. There is a 2.6 cm left adrenal myelolipoma. Mild right renal parenchyma atrophy. Bilateral renal inferior pole scarring. There is no hydronephrosis on either side. The urinary bladder is grossly unremarkable. Stomach/Bowel: There is sigmoid diverticulosis. There is no bowel obstruction or active inflammation. The appendix is normal. Lymphatic: No adenopathy. Reproductive: The prostate is grossly remarkable Other: Right paraspinal hematoma extends from L2-L5 along the right psoas muscle and measures approximately 3.5 x 6 cm in greatest axial dimensions. Musculoskeletal: Osteopenia with degenerative changes of the spine. Minimally displaced triangular fracture of the anterior superior L2 with extension of the fracture into the L2 superior endplate. L2-L3 disc spacer and posterior fusion. Review of the MIP images confirms the above findings. IMPRESSION: 1. No acute intrathoracic pathology. 2. A 7.7 cm infrarenal abdominal aortic aneurysm status post prior endovascular stent graft repair. The excluded aneurysm sac has increased in size since the prior CT. An endoleak is not excluded. Vascular surgery consult is advised. 3. Minimally displaced triangular fracture of the anterior superior  L2 with extension of the fracture into the L2 superior endplate. 4. Right paraspinal hematoma extends from L2-L5 along the right psoas muscle and measures approximately 3.5 x 6 cm in greatest axial dimensions. 5. Sigmoid diverticulosis. No bowel obstruction. Normal appendix. 6.  Aortic Atherosclerosis (ICD10-I70.0). Electronically Signed   By: Vanetta Chou M.D.   On: 07/31/2024 18:49   CT L-SPINE NO CHARGE Result Date: 07/31/2024 CLINICAL DATA:  Fall.  Trauma to the back.  Back pain. EXAM: CT THORACIC AND LUMBAR SPINE WITHOUT CONTRAST TECHNIQUE: Multidetector CT imaging of the thoracic and lumbar spine was performed without contrast. Multiplanar CT image reconstructions were also generated. RADIATION DOSE REDUCTION: This exam was performed according to the departmental dose-optimization program which includes automated exposure control, adjustment of the mA and/or kV according to patient size and/or use of iterative reconstruction technique. COMPARISON:  CT abdomen pelvis dated 07/01/2023. FINDINGS: CT THORACIC SPINE FINDINGS Alignment: No acute subluxation. Vertebrae: No acute fracture.  Osteopenia. Paraspinal and other soft tissues: No paraspinal fluid collection hematoma. Disc levels: No acute findings.  Degenerative changes. CT LUMBAR SPINE FINDINGS Segmentation: 5 lumbar type vertebrae. Alignment: No acute subluxation. Vertebrae: Minimally displaced fracture of the anterior corner of the superior endplate of L2. There is extension of the fracture to the superior endplate. No other acute fracture. There is advanced osteopenia which limits evaluation for fracture. Paraspinal and other soft tissues: Right paraspinal hematoma extends from L2 down to L5 posterior to the right psoas muscle. Disc levels: L2-L3 and L3-L4 disc spacers. There is L2-L3 posterior fusion. Multilevel degenerative changes and facet arthropathy. IMPRESSION: 1. Minimally displaced fracture of the anterior corner of the superior endplate  of L2. 2. Right  paraspinal hematoma extends from L2 down to L5 posterior to the right psoas muscle. 3. No acute/traumatic thoracic spine pathology. Electronically Signed   By: Vanetta Chou M.D.   On: 07/31/2024 18:35   CT T-SPINE NO CHARGE Result Date: 07/31/2024 CLINICAL DATA:  Fall.  Trauma to the back.  Back pain. EXAM: CT THORACIC AND LUMBAR SPINE WITHOUT CONTRAST TECHNIQUE: Multidetector CT imaging of the thoracic and lumbar spine was performed without contrast. Multiplanar CT image reconstructions were also generated. RADIATION DOSE REDUCTION: This exam was performed according to the departmental dose-optimization program which includes automated exposure control, adjustment of the mA and/or kV according to patient size and/or use of iterative reconstruction technique. COMPARISON:  CT abdomen pelvis dated 07/01/2023. FINDINGS: CT THORACIC SPINE FINDINGS Alignment: No acute subluxation. Vertebrae: No acute fracture.  Osteopenia. Paraspinal and other soft tissues: No paraspinal fluid collection hematoma. Disc levels: No acute findings.  Degenerative changes. CT LUMBAR SPINE FINDINGS Segmentation: 5 lumbar type vertebrae. Alignment: No acute subluxation. Vertebrae: Minimally displaced fracture of the anterior corner of the superior endplate of L2. There is extension of the fracture to the superior endplate. No other acute fracture. There is advanced osteopenia which limits evaluation for fracture. Paraspinal and other soft tissues: Right paraspinal hematoma extends from L2 down to L5 posterior to the right psoas muscle. Disc levels: L2-L3 and L3-L4 disc spacers. There is L2-L3 posterior fusion. Multilevel degenerative changes and facet arthropathy. IMPRESSION: 1. Minimally displaced fracture of the anterior corner of the superior endplate of L2. 2. Right paraspinal hematoma extends from L2 down to L5 posterior to the right psoas muscle. 3. No acute/traumatic thoracic spine pathology. Electronically Signed    By: Vanetta Chou M.D.   On: 07/31/2024 18:35   CT Maxillofacial Wo Contrast Result Date: 07/31/2024 EXAM: CT OF THE FACE WITHOUT CONTRAST 07/31/2024 05:46:11 PM TECHNIQUE: CT of the face was performed without the administration of intravenous contrast. Multiplanar reformatted images are provided for review. Automated exposure control, iterative reconstruction, and/or weight based adjustment of the mA/kV was utilized to reduce the radiation dose to as low as reasonably achievable. COMPARISON: None available. CLINICAL HISTORY: Facial trauma, blunt. 89 yo male tripped while walking through doorway on Thursday. States hit head on rocking chair, light fixture fell on back. Reports headache. Reports mid back pain and generalized abd pain. FINDINGS: FACIAL BONES: Mandibular heads are normally positioned. No mandibular fracture. Pterygoid plates and zygomatic arches appear intact. No acute findings nasal bone fracture. No suspicious bone lesion. ORBITS: Globes are intact. No acute traumatic injury. No inflammatory change. SINUSES AND MASTOIDS: Mastoid air cells are clear. Mild mucosal thickening of the sinuses without fluid level or sinus wall fracture. SOFT TISSUES: No acute abnormality. IMPRESSION: 1. No acute facial fracture. Electronically signed by: Luke Bun MD 07/31/2024 06:15 PM EDT RP Workstation: [id number]   DG Knee Complete 4 Views Right Result Date: 07/31/2024 EXAM: 4 OR MORE VIEW(S) XRAY OF THE RIGHT KNEE 07/31/2024 05:48:00 PM COMPARISON: None available. CLINICAL HISTORY: Trauma. Tripped while walking through doorway on Thursday. Bilateral knees and right elbow pain. FINDINGS: BONES AND JOINTS: Right knee replacement with normal alignment. No acute fracture. No focal osseous lesion. No joint dislocation. Trace knee effusion. SOFT TISSUES: Vascular calcifications. IMPRESSION: 1. No acute fracture. 2. Right knee arthroplasty with normal alignment. 3. Trace knee effusion. Electronically signed  by: Luke Bun MD 07/31/2024 06:06 PM EDT RP Workstation: [id number]   DG Knee Complete 4 Views Left Result Date: 07/31/2024 EXAM: 4 OR MORE  VIEW(S) XRAY OF THE LEFT KNEE 07/31/2024 05:48:00 PM COMPARISON: None available. CLINICAL HISTORY: trauma. Tripped while walking through doorway on thursday. Bilateral knees and right elbow pain. FINDINGS: BONES AND JOINTS: Knee replacement with normal alignment. No definitive acute displaced fracture. No focal osseous lesion. Trace knee effusion. SOFT TISSUES: Vascular calcifications. The soft tissues are unremarkable. IMPRESSION: 1. No acute displaced fracture. 2. Trace knee effusion. 3. Status post knee arthroplasty with normal alignment. Electronically signed by: Luke Bun MD 07/31/2024 06:05 PM EDT RP Workstation: [id number]   DG Elbow Complete Right Result Date: 07/31/2024 EXAM: 3 VIEW(S) XRAY OF THE RIGHT ELBOW COMPARISON: None available. CLINICAL HISTORY: trauma. Tripped while walking through doorway on thursday. Bilateral knees and right elbow pain. FINDINGS: BONES AND JOINTS: No definitive fracture or malalignment. No joint dislocation. No significant effusion. SOFT TISSUES: Soft tissue calcifications at the posterior elbow. Vascular calcifications. IMPRESSION: 1. No acute fracture or malalignment. 2. No significant elbow joint effusion. Electronically signed by: Luke Bun MD 07/31/2024 06:03 PM EDT RP Workstation: [id number]   CT Head Wo Contrast Result Date: 07/31/2024 EXAM: CT HEAD WITHOUT CONTRAST 07/31/2024 05:46:11 PM TECHNIQUE: CT of the head was performed without the administration of intravenous contrast. Automated exposure control, iterative reconstruction, and/or weight based adjustment of the mA/kV was utilized to reduce the radiation dose to as low as reasonably achievable. COMPARISON: None available. CLINICAL HISTORY: Head trauma, intracranial venous injury suspected. 88 yo male tripped while walking through doorway on Thursday.  States hit head on rocking chair, light fixture fell on back. Reports headache. Reports mid back pain and generalized abd pain. FINDINGS: BRAIN AND VENTRICLES: No acute territorial infarction, hemorrhage, or intracranial mass. Atrophy and mild chronic small vessel ischemic changes of the white matter. Nonenlarged ventricles. No extra-axial collection. Multiple small chronic cerebellar infarcts. Small chronic left frontal lobe infarct near the vertex. Small chronic infarct within the medial left temporal lobe, sagittal series 10 image 48. Chronic lacunar infarcts within the thalamus. Vertebral and carotid vascular calcification. ORBITS: No acute abnormality. SINUSES: No acute abnormality. SOFT TISSUES AND SKULL: No acute soft tissue abnormality. No skull fracture. IMPRESSION: 1. No acute intracranial abnormality. 2. Atrophy with mild chronic small vessel ischemic disease. 3. Multifocal chronic-appearing infarcts as described above. Electronically signed by: Luke Bun MD 07/31/2024 06:02 PM EDT RP Workstation: [id number]   CT Cervical Spine Wo Contrast Result Date: 07/31/2024 EXAM: CT CERVICAL SPINE WITHOUT CONTRAST 07/31/2024 05:46:11 PM TECHNIQUE: CT of the cervical spine was performed without the administration of intravenous contrast. Multiplanar reformatted images are provided for review. Automated exposure control, iterative reconstruction, and/or weight based adjustment of the mA/kV was utilized to reduce the radiation dose to as low as reasonably achievable. COMPARISON: CT cervical spine 02/08/2011. CLINICAL HISTORY: Neck trauma (Age >= 65y). 88 yo male tripped while walking through doorway on Thursday. States hit head on rocking chair, light fixture fell on back. Reports headache. Reports mid back pain and generalized abd pain. FINDINGS: CERVICAL SPINE: BONES AND ALIGNMENT: Small focal lucency involving the left lateral mass of C2, present on the 2012 study, possibly a nutrient vessel foramen. Anterior  cervical fusion hardware at C4-C5 and C6-C7. Posterior instrumented fusion from C6 to T1. Solid osseous fusion from C4 to C7 levels. No solid osseous fusion demonstrated across the C7-T1 level. Hardware is intact. No compression fracture or displaced fracture demonstrated within the cervical spine. Sequelae of laminectomy at C7-T1. DEGENERATIVE CHANGES: Atherosclerosis at the carotid bifurcations. Degenerative endplate osteophytes at multiple levels. Disc bulge at  C2-C3 resulting in at least mild spinal canal stenosis. Disc osteophyte complex at C3-C4 results in mild to moderate spinal canal stenosis. Facet arthrosis and uncovertebral hypertrophy at multiple levels resulting in significant foraminal stenosis, most pronounced at C3-C4. SOFT TISSUES: No prevertebral soft tissue swelling. IMPRESSION: 1. No acute abnormality of the cervical spine related to reported neck trauma. 2. Degenerative changes as above. 3. Anterior cervical fusion hardware at C4-5 and C6-7, with additional posterior instrumented fusion from C6 to T1. Solid osseous fusion from C4 to C7 levels. No solid osseous fusion demonstrated across the C7-T1 level. Hardware is intact. Electronically signed by: Donnice Mania MD 07/31/2024 05:59 PM EDT RP Workstation: [id number]      Assessment/Plan: Estimated body mass index is 28.7 kg/m as calculated from the following:   Height as of 04/17/24: 5' 9.5 (1.765 m).   Weight as of 04/17/24: 11.20 kg.   88 year old gentleman who is well-known to me after multiple cervical and lumbar spine surgeries, who suffered a fall last Thursday and has had back pain since that time.  The anterior superior endplate fracture of L2 was very minor and should be quite stable.  Can consider bracing for comfort and have asked the family to bring his old back brace from home that he has from his previous surgeries.  He actually may be having more pain from the psoas hematoma then from the back fracture itself.  Recommend  physical and Occupational Therapy to mobilizing.  Pain control as needed.  I am happy to see him in follow-up as an outpatient and follow serial x-rays, though I think this fracture is stable.  He has had multiple falls and therefore I think therapy is quite important.   Alm GORMAN Molt 08/01/2024 10:20 AM

## 2024-08-01 NOTE — H&P (Signed)
 [name] May 10, 1936  [id number].    HPI:  88 y/o M w/ a hx of CAD s/p CABG and PCI (only on ASA), AAA s/p endovascular repair c/b endoleak s/p coil embolization, CHF, and DM who presented to the ED after she sustained a mechanical fall 4 days ago. He reports that he tripped and landed near a rocking chair and then had a lamp fall on his back. He did not seek treatment initially but over the past few days the pain has become unbearable.  CT imaging was performed and shows displaced fx of the superior endplate of L2, R paraspinal hematoma from L2-L5  ROS: Review of Systems  Constitutional: Negative.   HENT: Negative.    Eyes: Negative.   Respiratory: Negative.    Cardiovascular: Negative.   Gastrointestinal: Negative.   Genitourinary: Negative.   Musculoskeletal:  Positive for back pain.  Skin: Negative.   Neurological: Negative.   Endo/Heme/Allergies: Negative.   Psychiatric/Behavioral: Negative.      Family History  Problem Relation Age of Onset   Arthritis Mother    Diabetes Father    Heart disease Father    Stroke Sister    Hypertension Sister    Heart disease Sister    Diabetes Sister    Breast cancer Sister    Heart disease Brother    Ulcers Brother    Colon cancer Neg Hx     Past Medical History:  Diagnosis Date   Adenomatous colon polyp    Ankle edema    Chronic   Asthma    CAD S/P percutaneous coronary angioplasty 2004   a) '04: Staged Taxus DES PCI to RCA (2 Taxus 2.75 x 32 & 12) and Cx-OM (Taxus 3 x 20);;'07 - PCI to pRCA ISR- Cypher DES; c) 05/2014: pPCI pRCA stentISR (Promus DES 3 x 8) OM1 distal to stent (Promus DES 2.75 x 12 - 3.1); 11/'22: distal LCx stent edge 99% -> Onyx Frontier DES 2.5 x 15 - 2.8 (overlaps prior stent)   CAD, multiple vessel 1985   Most recent July 2018: Admitted for non-STEMI. Occluded LAD with patent LIMA (SP1 now occluded).  Patent stents in proximal and mid RCA as well as circumflex-OM 3. Known occlusion of SVG-OM. EF  45-50%   CHF (congestive heart failure) (HCC)    Cholelithiasis - with cholangitis & choledocholithiasis    status post ERCP with removal of calculi and biliary stent placement.   Chronic anemia    On iron supplement; history of positive guaiac - negative colonoscopy in 1996.; Thought to be related to hemorrhoids; status post hemorrhoidectomy   Chronic back pain     multiple surgeries; C-spine and lumbar   Diabetes mellitus    no meds- pt states he is no longer DM   Diverticulitis of colon 1996   Diverticulosis    Dyslipidemia, goal LDL below 70    Erectile dysfunction    Exertional dyspnea    Chronic baseline SOB with ambulation   GERD (gastroesophageal reflux disease)    Grade II diastolic dysfunction    H/O: pneumonia 11/2012   Hemorrhoids    Hiatal hernia    History of: ST elevation myocardial infarction (STEMI) involving left circumflex coronary artery with complication 1985   PTCA-circumflex; PCI in 1991   Hypertension    Moderate aortic stenosis by prior echocardiogram 07/2015   Normal LV function - EF 55-60%.. Abnormal relaxation. Mild-moderate aortic stenosis (peak/mean gradient 18/10 mmH)   Osteoarthritis of both knees  And back; multiple back surgeries, right knee arthroplasty and left knee arthroscopic surgery x2   Pneumonia 2016   S/P AAA (abdominal aortic aneurysm) repair 08/30/2012   s/p EVAR   S/P CABG x 2 1997   LIMA-LAD, SVG-OM   Sleep apnea    pt. states he was told to return for f/u, to be fitted for Cpap, but pt. reports that he didn't follow up-no cpap used   Statin myopathy 09/30/2015    Past Surgical History:  Procedure Laterality Date   Abdominal and Lower Extremity Arterial Ultrasound  08/23/2012; 10/10/2013   Normal ABIs. Nonocclusive lower extremity disease. 4.2 cm x 4.3 cm infrarenal AAA;; 4.4 cm x 4.3 cm (essentially stable)    ABDOMINAL AORTIC ENDOVASCULAR STENT GRAFT N/A 08/13/2015   Procedure: ABDOMINAL AORTIC ENDOVASCULAR STENT GRAFT;   Surgeon: Lynwood JONETTA Collum, MD;  Location: [facility] OR;  Service: Vascular;  Laterality: N/A;   Anterior cervical plating  04/23/2010   At C4-5 and a C6-7 utilizing two separate Biomet MaxAn plates.   ANTERIOR LAT LUMBAR FUSION Left 11/22/2012   Procedure: ANTERIOR LATERAL LUMBAR FUSION 1 LEVEL;  Surgeon: Alm GORMAN Molt, MD;  Location: [facility] NEURO ORS;  Service: Neurosurgery;  Laterality: Left;  Anterior Lateral Lumbar Fusion Lumbar Three-Four   BACK SURGERY  1979 & x 10   pt. remarks, I have had about 10 back surgeries   BILIARY STENT PLACEMENT N/A 07/03/2014   Procedure: BILIARY STENT PLACEMENT;  Surgeon: Lamar JONETTA Aho, MD;  Location: [facility] ENDOSCOPY;  Service: Endoscopy;  Laterality: N/A;   CARPAL TUNNEL RELEASE Left 11/16/2019   Procedure: LEFT CARPAL TUNNEL RELEASE;  Surgeon: Vernetta Lonni GRADE, MD;  Location: [facility] ORS;  Service: Orthopedics;  Laterality: Left;   CARPAL TUNNEL RELEASE Right 01/10/2023   Procedure: RIGHT CARPAL TUNNEL RELEASE;  Surgeon: Molt Alm GORMAN, MD;  Location: [facility] OR;  Service: Neurosurgery;  Laterality: Right;   CATARACT EXTRACTION     Cervical arthrodesis  04/23/2010   Anterior cervical arthrodesis, C4-5, C6-7 utilizing 7-mm PEEK interbody cage packed with local autograft & Antifuse putty at C4-5 & an 8-mm cage at C6-7.   CERVICAL DISCECTOMY  04/23/2010   Decompressive anterior carvical diskectomy. C4-5, C6-7   CHOLECYSTECTOMY N/A 05/23/2015   Procedure: LAPAROSCOPIC CHOLECYSTECTOMY WITH INTRAOPERATIVE CHOLANGIOGRAM;  Surgeon: Alm Angle, MD;  Location: [facility] ORS;  Service: General;  Laterality: N/A;   COLONOSCOPY  1996   CORONARY ANGIOPLASTY  1985,1991,15   1985 lateral STEMI Circumflex PTCA;    CORONARY ARTERY BYPASS GRAFT  1997   LIMA-LAD, SVG-OM (SVG known to be occluded prior to 2004)   CORONARY STENT INTERVENTION N/A 08/20/2021   Procedure: CORONARY STENT INTERVENTION;  Surgeon: Court Dorn PARAS, MD;  Location: [facility] INVASIVE CV LAB;  Service: Cardiovascular:  dLCx  distal Edge 99% => Onyx Frontier DES 2.5 x 15 (2.8 mm) overlaps prior stent distally.   ERCP N/A 07/03/2014   Procedure: ENDOSCOPIC RETROGRADE CHOLANGIOPANCREATOGRAPHY (ERCP);  Surgeon: Lamar JONETTA Aho, MD;  Location[facility] ENDOSCOPY;  Service: Endoscopy;  Laterality: N/A;   ERCP N/A 07/05/2014   Procedure: ENDOSCOPIC RETROGRADE CHOLANGIOPANCREATOGRAPHY (ERCP);  Surgeon: Lamar JONETTA Aho, MD;  Location: [facility] ENDOSCOPY;  Service: Endoscopy;  Laterality: N/A;   ERCP N/A 06/30/2015   Procedure: ENDOSCOPIC RETROGRADE CHOLANGIOPANCREATOGRAPHY (ERCP);  Surgeon: Gwendlyn ONEIDA Buddy, MD;  Location: [facility];  Service: Endoscopy;  Laterality: N/A;   EYE SURGERY     IR ANGIOGRAM EXTREMITY LEFT  01/18/2017   IR ANGIOGRAM PELVIS SELECTIVE OR SUPRASELECTIVE  01/18/2017   IR ANGIOGRAM SELECTIVE EACH ADDITIONAL VESSEL  01/18/2017   IR AORTAGRAM ABDOMINAL SERIALOGRAM  01/18/2017   IR EMBO ARTERIAL NOT HEMORR HEMANG INC GUIDE ROADMAPPING  01/18/2017   IR RADIOLOGIST EVAL & MGMT  01/04/2017   IR RADIOLOGIST EVAL & MGMT  10/26/2017   IR RADIOLOGIST EVAL & MGMT  05/24/2018   IR RADIOLOGIST EVAL & MGMT  06/19/2019   IR RADIOLOGIST EVAL & MGMT  06/11/2020   IR RADIOLOGIST EVAL & MGMT  07/02/2021   IR RADIOLOGIST EVAL & MGMT  06/22/2022   IR RADIOLOGIST EVAL & MGMT  07/08/2023   IR US  GUIDE VASC ACCESS RIGHT  01/18/2017   KNEE ARTHROSCOPY Left    x 2   LEFT HEART CATH AND CORS/GRAFTS ANGIOGRAPHY  06/2003   None Occluded vein graft to OM; diffuse RCA disease in the mid vessel, 80% circumflex-OM stenosis; follow on AV groove circumflex with sequential 90% stenoses and intervening saccular dilation    LEFT HEART CATH AND CORS/GRAFTS ANGIOGRAPHY  12/2008   4 abnormal Myoview  showing apical thinning (possibly due to apical LAD 95%) : 100% Occluded LAD after SV1, distal LAD grafted via LIMA -apical 95% . Cx -OM1 w/patent stent extending into OM 1 . Follow on Cx - 70-80% - non-amenable PCI. RCA widely patent 3 overlapping  stents in mRCA w/less than 40% stenosisin RPL; SVG-OM known occluded    LEFT HEART CATH AND CORS/GRAFTS ANGIOGRAPHY N/A 04/20/2017   Procedure: Left Heart Cath and Cors/Grafts Angiography;  Surgeon: Court Dorn PARAS, MD;  Location: [facility] INVASIVE CV LAB;  LAD now occluded prior to SP1.LIMA-LAD. Patent RCA and circumflex stents. EF 45-50%. Relatively stable.    LEFT HEART CATH AND CORS/GRAFTS ANGIOGRAPHY N/A 08/20/2021   Procedure: LEFT HEART CATH AND CORS/GRAFTS ANGIOGRAPHY;  Surgeon: Court Dorn PARAS, MD;  Location: [facility] INVASIVE CV LAB;  Service: Cardiovascular: CULPRIT 99% dLCx just after stent (Overlapping DES PCI) - otw patent LCx stent, patent RCA stent & Patent LIMA-LAD with CTO of prox LAD.   LEFT HEART CATHETERIZATION WITH CORONARY ANGIOGRAM N/A 05/15/2014   Procedure: LEFT HEART CATHETERIZATION WITH CORONARY ANGIOGRAM;  Surgeon: Victory LELON Claudene DOUGLAS, MD;  Location: [facility] CATH LAB;  Service: Cardiovascular;  Laterality: N/A;   LEFT HEART CATHETERIZATION WITH CORONARY/GRAFT ANGIOGRAM N/A 06/28/2014   Procedure: LEFT HEART CATHETERIZATION WITH EL BILE;  Surgeon: Debby DELENA Sor, MD;  Location: [facility] CATH LAB;  Service: Cardiovascular;  Laterality: N/A;   LUMBAR PERCUTANEOUS PEDICLE SCREW 1 LEVEL N/A 11/22/2012   Procedure: LUMBAR PERCUTANEOUS PEDICLE SCREW 1 LEVEL;  Surgeon: Alm GORMAN Molt, MD;  Location: [facility] NEURO ORS;  Service: Neurosurgery;  Laterality: N/A;  Lumbar Three-Four Percutaneous Pedicle Screw, Lateral approach   MINOR HEMORRHOIDECTOMY     NM MYOVIEW  LTD  05/26/2021   Lexiscan : Intermediate risk (similar findings to last study), with exception of reduced EF roughly 40%..  Prior basal inferior-inferolateral infarct without peri-infarct ischemia.   ->  Echo ordered -> (EF 45 to 50%)-when compared to prior Myoview , infarct appears similar.   PERCUTANEOUS CORONARY STENT INTERVENTION (PCI-S)  06/2003   PCI - RCA 2 overlapping Taxus DES 2.75 mm x 32 mm and 2.75 mm x 12 mm (3.0 mm);  PCI-Cx-OM1 - Taxus DES 3.0 mm x 20 mm (3.1 mm);    PERCUTANEOUS CORONARY STENT INTERVENTION (PCI-S)  12/2005   80% ISR in proximal Taxus stent in RCA -- covered proximally with Cypher DES 3.0 mm x 12 mm   PERCUTANEOUS CORONARY STENT INTERVENTION (PCI-S)  N/A 05/17/2014   Procedure: PERCUTANEOUS CORONARY STENT INTERVENTION (PCI-S);  Surgeon: Victory LELON Claudene DOUGLAS, MD;  Location: [facility] CATH LAB: PCI pRCA stent ISR - Promus DES 3.0 x 8 (3.25), OM distal stent edge - Promus DES 2.75 x 12 (3.1)   PERIPHERAL VASCULAR CATHETERIZATION  11/03/2016   Procedure: Embolization;  Surgeon: Penne Lonni Colorado, MD;  Location: [facility] INVASIVE CV LAB;  Service: Cardiovascular;;   POSTERIOR CERVICAL FUSION/FORAMINOTOMY N/A 05/02/2013   Procedure: POSTERIOR CERVICAL FUSION/FORAMINOTOMY CERVICAL SEVEN THORACIC-ONE;  Surgeon: Alm GORMAN Molt, MD;  Location: [facility] NEURO ORS;  Service: Neurosurgery;  Laterality: N/A;  POSTERIOR CERVICAL FUSION/FORAMINOTOMY CERVICAL SEVEN THORACIC-ONE   SPHINCTEROTOMY N/A 06/30/2015   Procedure: SPHINCTEROTOMY;  Surgeon: Gwendlyn ONEIDA Buddy, MD;  Location: [facility] ENDOSCOPY;  Service: Endoscopy;  Laterality: N/A;   TOTAL KNEE ARTHROPLASTY Right    TOTAL KNEE ARTHROPLASTY Left 12/20/2016   Procedure: LEFT TOTAL KNEE ARTHROPLASTY;  Surgeon: Marcey Raman, MD;  Location: [facility] OR;  Service: Orthopedics;  Laterality: Left;   TRANSTHORACIC ECHOCARDIOGRAM  06/10/2021   EF 45 to 50%.  Mildly reduced function.  Mild hypokinesis of basal-mid inferior and inferolateral wall consistent with prior infarct.  Severe LA dilation.  Mild to moderate MR with moderate MAC.  Mild calcific AS (mean gradient 11 mmHg) --> LV function seems unchanged.  Diastolic function improved.  Wall motion abnormality unchanged.  AS unchanged.   TRANSTHORACIC ECHOCARDIOGRAM  07/2018   Normal LV size.  EF 50 to 55% with mild HK of inferolateral wall.  GRII DD.  Mild AS with mean gradient 15 mm or greater.  Mild ascending aortic dilation.  Mildly  increased PA pressures of 41 mmHg   UPPER GI ENDOSCOPY  07/03/2019   VISCERAL ANGIOGRAM  11/03/2016   Procedure: Visceral Angiogram;  Surgeon: Penne Lonni Colorado, MD;  Location: [facility] INVASIVE CV LAB;  Service: Cardiovascular;;    Social History:  reports that he quit smoking about 40 years ago. His smoking use included cigarettes. He started smoking about 85 years ago. He has a 135 pack-year smoking history. He has never used smokeless tobacco. He reports current alcohol use of about 7.0 - 14.0 standard drinks of alcohol per week. He reports that he does not use drugs.  Allergies:  Allergies  Allergen Reactions   Oxycodone  Shortness Of Breath and Cough   Lisinopril Cough   Statins Other (See Comments)    MYALGIAS    Welchol [Colesevelam Hcl] Itching    Medications Prior to Admission  Medication Sig Dispense Refill   acetaminophen  (TYLENOL ) 500 MG tablet Take 1,000 mg by mouth every 6 (six) hours as needed for mild pain (pain score 1-3), moderate pain (pain score 4-6) or headache. Reported on 03/16/2016 (Patient not taking: Reported on 04/17/2024)     albuterol  (VENTOLIN  HFA) 108 (90 Base) MCG/ACT inhaler Inhale 2 puffs into the lungs every 6 (six) hours as needed for wheezing or shortness of breath. (Patient not taking: Reported on 04/17/2024) 18 g 0   amLODipine  (NORVASC ) 5 MG tablet Take 1 tablet by mouth once daily 90 tablet 2   aspirin  EC 81 MG tablet Take 81 mg by mouth daily. Swallow whole.     Choline Fenofibrate  (FENOFIBRIC ACID ) 135 MG CPDR Take 1 tablet by mouth daily. 90 capsule 2   ezetimibe  (ZETIA ) 10 MG tablet Take 1 tablet by mouth once daily 90 tablet 2   Ferrous Sulfate  (IRON PO) Take 65 mg by mouth daily.     finasteride  (PROSCAR ) 5 MG tablet  Take 1 tablet (5 mg total) by mouth daily. 90 tablet 3   fluticasone -salmeterol (ADVAIR) 100-50 MCG/ACT AEPB Inhale 1 puff into the lungs 2 (two) times daily. (Patient not taking: Reported on 04/17/2024) 1 each 0   furosemide   (LASIX ) 20 MG tablet Take 2 tablets (40 mg total) by mouth daily as needed. (Patient not taking: Reported on 04/17/2024) 30 tablet 6   glucose blood (ONETOUCH VERIO) test strip 1 each by Other route as needed for other. Use as instructed 100 each 2   Multiple Vitamins-Minerals (MULTIVITAMIN WITH MINERALS) tablet Take 1 tablet by mouth daily.     nitrofurantoin , macrocrystal-monohydrate, (MACROBID ) 100 MG capsule Take 1 capsule (100 mg total) by mouth 2 (two) times daily. 14 capsule 0   pantoprazole  (PROTONIX ) 40 MG tablet Take 1 tablet by mouth once daily 90 tablet 4   ranolazine  (RANEXA ) 500 MG 12 hr tablet Take 1 tablet by mouth twice daily 180 tablet 3   sildenafil  (VIAGRA ) 100 MG tablet TAKE ONE TABLET BY MOUTH DAILY AS NEEDED FOR ERECTILE DYSFUNCTION (Patient not taking: Reported on 04/17/2024) 90 tablet 1   valsartan -hydrochlorothiazide  (DIOVAN -HCT) 320-12.5 MG tablet Take 1 tablet by mouth once daily 90 tablet 0    Physical Exam: Blood pressure (!) 145/83, pulse 74, temperature 98.2 F (36.8 C), temperature source Oral, resp. rate 14, SpO2 98%. Gen: elderly male, NAD HEENT: ecchymosis of the right periorbital area, no deformity, no laceration Resp: equal chest rise, non-tender CV: MAPs 90s, HR 60s Abd: soft, non-distended, non-tender Back: TTP along L spine, no stepoffs Ext: superficial abrasions of bilateral elbows and left knee, no deformity, strength 5/5 lower extremity, sensation intact Neuro: moving all extremities, answers questions appropriately  Results for orders placed or performed during the hospital encounter of 07/31/24 (from the past 48 hours)  CBC with Differential     Status: Abnormal   Collection Time: 07/31/24  3:58 PM  Result Value Ref Range   WBC 7.3 4.0 - 10.5 K/uL   RBC 3.20 (L) 4.22 - 5.81 MIL/uL   Hemoglobin 10.0 (L) 13.0 - 17.0 g/dL   HCT 69.6 (L) 60.9 - 47.9 %   MCV 94.7 80.0 - 100.0 fL   MCH 31.3 26.0 - 34.0 pg   MCHC 33.0 30.0 - 36.0 g/dL   RDW 84.8  88.4 - 84.4 %   Platelets 202 150 - 400 K/uL   nRBC 0.0 0.0 - 0.2 %   Neutrophils Relative % 71 %   Neutro Abs 5.1 1.7 - 7.7 K/uL   Lymphocytes Relative 20 %   Lymphs Abs 1.5 0.7 - 4.0 K/uL   Monocytes Relative 9 %   Monocytes Absolute 0.6 0.1 - 1.0 K/uL   Eosinophils Relative 0 %   Eosinophils Absolute 0.0 0.0 - 0.5 K/uL   Basophils Relative 0 %   Basophils Absolute 0.0 0.0 - 0.1 K/uL   Immature Granulocytes 0 %   Abs Immature Granulocytes 0.03 0.00 - 0.07 K/uL    Comment: Performed at [facility], [address] [place], [place] [address]  Comprehensive metabolic panel     Status: Abnormal   Collection Time: 07/31/24  3:58 PM  Result Value Ref Range   Sodium 137 135 - 145 mmol/L   Potassium 4.3 3.5 - 5.1 mmol/L   Chloride 102 98 - 111 mmol/L   CO2 23 22 - 32 mmol/L   Glucose, Bld 118 (H) 70 - 99 mg/dL    Comment: Glucose reference range applies  only to samples taken after fasting for at least 8 hours.   BUN 29 (H) 8 - 23 mg/dL   Creatinine, Ser 8.26 (H) 0.61 - 1.24 mg/dL   Calcium  9.7 8.9 - 10.3 mg/dL   Total Protein 6.5 6.5 - 8.1 g/dL   Albumin 3.7 3.5 - 5.0 g/dL   AST 23 15 - 41 U/L   ALT 13 0 - 44 U/L   Alkaline Phosphatase 39 38 - 126 U/L   Total Bilirubin 1.2 0.0 - 1.2 mg/dL   GFR, Estimated 38 (L) >60 mL/min    Comment: (NOTE) Calculated using the CKD-EPI Creatinine Equation (2021)    Anion gap 12 5 - 15    Comment: Performed at [facility], [address] [place], [place] [address]  Urinalysis, Routine w reflex microscopic -Urine, Clean Catch     Status: Abnormal   Collection Time: 07/31/24  4:29 PM  Result Value Ref Range   Color, Urine YELLOW YELLOW   APPearance CLEAR CLEAR   Specific Gravity, Urine 1.015 1.005 - 1.030   pH 5.5 5.0 - 8.0   Glucose, UA NEGATIVE NEGATIVE mg/dL   Hgb urine dipstick TRACE (A) NEGATIVE   Bilirubin Urine NEGATIVE NEGATIVE   Ketones, ur NEGATIVE NEGATIVE mg/dL   Protein, ur NEGATIVE NEGATIVE mg/dL    Nitrite NEGATIVE NEGATIVE   Leukocytes,Ua NEGATIVE NEGATIVE    Comment: Performed at [facility], [address] [place], [place] [address]  Urinalysis, Microscopic (reflex)     Status: Abnormal   Collection Time: 07/31/24  4:29 PM  Result Value Ref Range   RBC / HPF 0-5 0 - 5 RBC/hpf   WBC, UA 0-5 0 - 5 WBC/hpf   Bacteria, UA FEW (A) NONE SEEN   Squamous Epithelial / HPF 0-5 0 - 5 /HPF   Mucus PRESENT    Hyaline Casts, UA PRESENT     Comment: Performed at [facility], [address] [place], [place] [address]   CT Angio Chest/Abd/Pel for Dissection W and/or Wo Contrast Result Date: 07/31/2024 CLINICAL DATA:  Acute aortic syndrome suspected.  Fall. EXAM: CT ANGIOGRAPHY CHEST, ABDOMEN AND PELVIS TECHNIQUE: Non-contrast CT of the chest was initially obtained. Multidetector CT imaging through the chest, abdomen and pelvis was performed using the standard protocol during bolus administration of intravenous contrast. Multiplanar reconstructed images and MIPs were obtained and reviewed to evaluate the vascular anatomy. RADIATION DOSE REDUCTION: This exam was performed according to the departmental dose-optimization program which includes automated exposure control, adjustment of the mA and/or kV according to patient size and/or use of iterative reconstruction technique. CONTRAST:  80mL OMNIPAQUE  IOHEXOL  350 MG/ML SOLN COMPARISON:  CT abdomen pelvis dated 07/01/2023. FINDINGS: CTA CHEST FINDINGS Cardiovascular: Mild cardiomegaly. No pericardial effusion. There is advanced 3 vessel coronary vascular calcification and calcification of the mitral annulus. Moderate atherosclerotic calcification of the thoracic aorta. No abdominal dilatation or dissection. The origins of the great vessels of the aortic arch and the central pulmonary arteries appear patent. Mediastinum/Nodes: No hilar or mediastinal adenopathy. Moderate size hiatal hernia. The esophagus is grossly unremarkable. No  mediastinal fluid collection. Lungs/Pleura: Trace right pleural effusion. There is minimal subpleural atelectasis of the right lower lobe. There are bibasilar subpleural linear atelectasis/scarring. No consolidative changes or pneumothorax. The central airways are patent. Musculoskeletal: Osteopenia with degenerative changes of the spine. Median sternotomy wires. No acute osseous pathology. Review of the MIP images confirms the above findings. CTA ABDOMEN AND  PELVIS FINDINGS VASCULAR Aorta: Advanced atherosclerotic calcification of the abdominal aorta. There is a 7.7 cm infrarenal abdominal aortic aneurysm status post endovascular stent graft repair. The excluded aneurysm sac has increased in size since the prior CT (previously measuring approximately 7 cm in diameter. Evaluation is limited as the precontrast and delayed images of the abdomen are not performed. Faint high attenuating focus within the sac at 8 o'clock position (228/11) may be chronic. An endoleak is not excluded. Celiac: Atherosclerotic calcification of the origin of the celiac trunk. The celiac artery and its major branches are patent. SMA: Atherosclerotic calcification of the origin of the SMA. The SMA is patent. Renals: Atherosclerotic calcification of the origins of the renal arteries. The renal arteries are patent. IMA: The origin IMA is not visualized, likely occluded. Inflow: The iliac arteries are patent. No aneurysmal dilatation or dissection. Veins: No obvious venous abnormality within the limitations of this arterial phase study. Review of the MIP images confirms the above findings. NON-VASCULAR No intra-abdominal free air or free fluid. Hepatobiliary: The liver is unremarkable. Mild biliary dilatation, post cholecystectomy. Pancreas: Unremarkable. No pancreatic ductal dilatation or surrounding inflammatory changes. Spleen: Normal in size without focal abnormality. Adrenals/Urinary Tract: The right adrenal glands unremarkable. There is a  2.6 cm left adrenal myelolipoma. Mild right renal parenchyma atrophy. Bilateral renal inferior pole scarring. There is no hydronephrosis on either side. The urinary bladder is grossly unremarkable. Stomach/Bowel: There is sigmoid diverticulosis. There is no bowel obstruction or active inflammation. The appendix is normal. Lymphatic: No adenopathy. Reproductive: The prostate is grossly remarkable Other: Right paraspinal hematoma extends from L2-L5 along the right psoas muscle and measures approximately 3.5 x 6 cm in greatest axial dimensions. Musculoskeletal: Osteopenia with degenerative changes of the spine. Minimally displaced triangular fracture of the anterior superior L2 with extension of the fracture into the L2 superior endplate. L2-L3 disc spacer and posterior fusion. Review of the MIP images confirms the above findings. IMPRESSION: 1. No acute intrathoracic pathology. 2. A 7.7 cm infrarenal abdominal aortic aneurysm status post prior endovascular stent graft repair. The excluded aneurysm sac has increased in size since the prior CT. An endoleak is not excluded. Vascular surgery consult is advised. 3. Minimally displaced triangular fracture of the anterior superior L2 with extension of the fracture into the L2 superior endplate. 4. Right paraspinal hematoma extends from L2-L5 along the right psoas muscle and measures approximately 3.5 x 6 cm in greatest axial dimensions. 5. Sigmoid diverticulosis. No bowel obstruction. Normal appendix. 6.  Aortic Atherosclerosis (ICD10-I70.0). Electronically Signed   By: Vanetta Chou M.D.   On: 07/31/2024 18:49   CT L-SPINE NO CHARGE Result Date: 07/31/2024 CLINICAL DATA:  Fall.  Trauma to the back.  Back pain. EXAM: CT THORACIC AND LUMBAR SPINE WITHOUT CONTRAST TECHNIQUE: Multidetector CT imaging of the thoracic and lumbar spine was performed without contrast. Multiplanar CT image reconstructions were also generated. RADIATION DOSE REDUCTION: This exam was performed  according to the departmental dose-optimization program which includes automated exposure control, adjustment of the mA and/or kV according to patient size and/or use of iterative reconstruction technique. COMPARISON:  CT abdomen pelvis dated 07/01/2023. FINDINGS: CT THORACIC SPINE FINDINGS Alignment: No acute subluxation. Vertebrae: No acute fracture.  Osteopenia. Paraspinal and other soft tissues: No paraspinal fluid collection hematoma. Disc levels: No acute findings.  Degenerative changes. CT LUMBAR SPINE FINDINGS Segmentation: 5 lumbar type vertebrae. Alignment: No acute subluxation. Vertebrae: Minimally displaced fracture of the anterior corner of the superior endplate of L2. There  is extension of the fracture to the superior endplate. No other acute fracture. There is advanced osteopenia which limits evaluation for fracture. Paraspinal and other soft tissues: Right paraspinal hematoma extends from L2 down to L5 posterior to the right psoas muscle. Disc levels: L2-L3 and L3-L4 disc spacers. There is L2-L3 posterior fusion. Multilevel degenerative changes and facet arthropathy. IMPRESSION: 1. Minimally displaced fracture of the anterior corner of the superior endplate of L2. 2. Right paraspinal hematoma extends from L2 down to L5 posterior to the right psoas muscle. 3. No acute/traumatic thoracic spine pathology. Electronically Signed   By: Vanetta Chou M.D.   On: 07/31/2024 18:35   CT T-SPINE NO CHARGE Result Date: 07/31/2024 CLINICAL DATA:  Fall.  Trauma to the back.  Back pain. EXAM: CT THORACIC AND LUMBAR SPINE WITHOUT CONTRAST TECHNIQUE: Multidetector CT imaging of the thoracic and lumbar spine was performed without contrast. Multiplanar CT image reconstructions were also generated. RADIATION DOSE REDUCTION: This exam was performed according to the departmental dose-optimization program which includes automated exposure control, adjustment of the mA and/or kV according to patient size and/or use of  iterative reconstruction technique. COMPARISON:  CT abdomen pelvis dated 07/01/2023. FINDINGS: CT THORACIC SPINE FINDINGS Alignment: No acute subluxation. Vertebrae: No acute fracture.  Osteopenia. Paraspinal and other soft tissues: No paraspinal fluid collection hematoma. Disc levels: No acute findings.  Degenerative changes. CT LUMBAR SPINE FINDINGS Segmentation: 5 lumbar type vertebrae. Alignment: No acute subluxation. Vertebrae: Minimally displaced fracture of the anterior corner of the superior endplate of L2. There is extension of the fracture to the superior endplate. No other acute fracture. There is advanced osteopenia which limits evaluation for fracture. Paraspinal and other soft tissues: Right paraspinal hematoma extends from L2 down to L5 posterior to the right psoas muscle. Disc levels: L2-L3 and L3-L4 disc spacers. There is L2-L3 posterior fusion. Multilevel degenerative changes and facet arthropathy. IMPRESSION: 1. Minimally displaced fracture of the anterior corner of the superior endplate of L2. 2. Right paraspinal hematoma extends from L2 down to L5 posterior to the right psoas muscle. 3. No acute/traumatic thoracic spine pathology. Electronically Signed   By: Vanetta Chou M.D.   On: 07/31/2024 18:35   CT Maxillofacial Wo Contrast Result Date: 07/31/2024 EXAM: CT OF THE FACE WITHOUT CONTRAST 07/31/2024 05:46:11 PM TECHNIQUE: CT of the face was performed without the administration of intravenous contrast. Multiplanar reformatted images are provided for review. Automated exposure control, iterative reconstruction, and/or weight based adjustment of the mA/kV was utilized to reduce the radiation dose to as low as reasonably achievable. COMPARISON: None available. CLINICAL HISTORY: Facial trauma, blunt. 88 yo male tripped while walking through doorway on Thursday. States hit head on rocking chair, light fixture fell on back. Reports headache. Reports mid back pain and generalized abd pain.  FINDINGS: FACIAL BONES: Mandibular heads are normally positioned. No mandibular fracture. Pterygoid plates and zygomatic arches appear intact. No acute findings nasal bone fracture. No suspicious bone lesion. ORBITS: Globes are intact. No acute traumatic injury. No inflammatory change. SINUSES AND MASTOIDS: Mastoid air cells are clear. Mild mucosal thickening of the sinuses without fluid level or sinus wall fracture. SOFT TISSUES: No acute abnormality. IMPRESSION: 1. No acute facial fracture. Electronically signed by: Luke Bun MD 07/31/2024 06:15 PM EDT RP Workstation: [id number]   DG Knee Complete 4 Views Right Result Date: 07/31/2024 EXAM: 4 OR MORE VIEW(S) XRAY OF THE RIGHT KNEE 07/31/2024 05:48:00 PM COMPARISON: None available. CLINICAL HISTORY: Trauma. Tripped while walking through doorway on Thursday.  Bilateral knees and right elbow pain. FINDINGS: BONES AND JOINTS: Right knee replacement with normal alignment. No acute fracture. No focal osseous lesion. No joint dislocation. Trace knee effusion. SOFT TISSUES: Vascular calcifications. IMPRESSION: 1. No acute fracture. 2. Right knee arthroplasty with normal alignment. 3. Trace knee effusion. Electronically signed by: Luke Bun MD 07/31/2024 06:06 PM EDT RP Workstation: [id number]   DG Knee Complete 4 Views Left Result Date: 07/31/2024 EXAM: 4 OR MORE VIEW(S) XRAY OF THE LEFT KNEE 07/31/2024 05:48:00 PM COMPARISON: None available. CLINICAL HISTORY: trauma. Tripped while walking through doorway on thursday. Bilateral knees and right elbow pain. FINDINGS: BONES AND JOINTS: Knee replacement with normal alignment. No definitive acute displaced fracture. No focal osseous lesion. Trace knee effusion. SOFT TISSUES: Vascular calcifications. The soft tissues are unremarkable. IMPRESSION: 1. No acute displaced fracture. 2. Trace knee effusion. 3. Status post knee arthroplasty with normal alignment. Electronically signed by: Luke Bun MD 07/31/2024 06:05  PM EDT RP Workstation: [id number]   DG Elbow Complete Right Result Date: 07/31/2024 EXAM: 3 VIEW(S) XRAY OF THE RIGHT ELBOW COMPARISON: None available. CLINICAL HISTORY: trauma. Tripped while walking through doorway on thursday. Bilateral knees and right elbow pain. FINDINGS: BONES AND JOINTS: No definitive fracture or malalignment. No joint dislocation. No significant effusion. SOFT TISSUES: Soft tissue calcifications at the posterior elbow. Vascular calcifications. IMPRESSION: 1. No acute fracture or malalignment. 2. No significant elbow joint effusion. Electronically signed by: Luke Bun MD 07/31/2024 06:03 PM EDT RP Workstation: [id number]   CT Head Wo Contrast Result Date: 07/31/2024 EXAM: CT HEAD WITHOUT CONTRAST 07/31/2024 05:46:11 PM TECHNIQUE: CT of the head was performed without the administration of intravenous contrast. Automated exposure control, iterative reconstruction, and/or weight based adjustment of the mA/kV was utilized to reduce the radiation dose to as low as reasonably achievable. COMPARISON: None available. CLINICAL HISTORY: Head trauma, intracranial venous injury suspected. 88 yo male tripped while walking through doorway on Thursday. States hit head on rocking chair, light fixture fell on back. Reports headache. Reports mid back pain and generalized abd pain. FINDINGS: BRAIN AND VENTRICLES: No acute territorial infarction, hemorrhage, or intracranial mass. Atrophy and mild chronic small vessel ischemic changes of the white matter. Nonenlarged ventricles. No extra-axial collection. Multiple small chronic cerebellar infarcts. Small chronic left frontal lobe infarct near the vertex. Small chronic infarct within the medial left temporal lobe, sagittal series 10 image 48. Chronic lacunar infarcts within the thalamus. Vertebral and carotid vascular calcification. ORBITS: No acute abnormality. SINUSES: No acute abnormality. SOFT TISSUES AND SKULL: No acute soft tissue abnormality. No  skull fracture. IMPRESSION: 1. No acute intracranial abnormality. 2. Atrophy with mild chronic small vessel ischemic disease. 3. Multifocal chronic-appearing infarcts as described above. Electronically signed by: Luke Bun MD 07/31/2024 06:02 PM EDT RP Workstation: [id number]   CT Cervical Spine Wo Contrast Result Date: 07/31/2024 EXAM: CT CERVICAL SPINE WITHOUT CONTRAST 07/31/2024 05:46:11 PM TECHNIQUE: CT of the cervical spine was performed without the administration of intravenous contrast. Multiplanar reformatted images are provided for review. Automated exposure control, iterative reconstruction, and/or weight based adjustment of the mA/kV was utilized to reduce the radiation dose to as low as reasonably achievable. COMPARISON: CT cervical spine 02/08/2011. CLINICAL HISTORY: Neck trauma (Age >= 65y). 88 yo male tripped while walking through doorway on Thursday. States hit head on rocking chair, light fixture fell on back. Reports headache. Reports mid back pain and generalized abd pain. FINDINGS: CERVICAL SPINE: BONES AND ALIGNMENT: Small focal lucency involving the left lateral mass of C2,  present on the 2012 study, possibly a nutrient vessel foramen. Anterior cervical fusion hardware at C4-C5 and C6-C7. Posterior instrumented fusion from C6 to T1. Solid osseous fusion from C4 to C7 levels. No solid osseous fusion demonstrated across the C7-T1 level. Hardware is intact. No compression fracture or displaced fracture demonstrated within the cervical spine. Sequelae of laminectomy at C7-T1. DEGENERATIVE CHANGES: Atherosclerosis at the carotid bifurcations. Degenerative endplate osteophytes at multiple levels. Disc bulge at C2-C3 resulting in at least mild spinal canal stenosis. Disc osteophyte complex at C3-C4 results in mild to moderate spinal canal stenosis. Facet arthrosis and uncovertebral hypertrophy at multiple levels resulting in significant foraminal stenosis, most pronounced at C3-C4. SOFT  TISSUES: No prevertebral soft tissue swelling. IMPRESSION: 1. No acute abnormality of the cervical spine related to reported neck trauma. 2. Degenerative changes as above. 3. Anterior cervical fusion hardware at C4-5 and C6-7, with additional posterior instrumented fusion from C6 to T1. Solid osseous fusion from C4 to C7 levels. No solid osseous fusion demonstrated across the C7-T1 level. Hardware is intact. Electronically signed by: Donnice Mania MD 07/31/2024 05:59 PM EDT RP Workstation: [id number]    Assessment/Plan 88 y/o M who experienced a mechanical fall and then had a lamp fall onto his back  L2 endplate fx with associated paraspinal hematoma - Dr. 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 Lucas called by [facility] and reported this looks stable  FEN - CLD VTE - Holding ID - None Admit - med/surg  Cordella DELENA Polly [facility] 08/01/2024, 1:25 AM Please see Amion for pager number during day hours 7:00am-4:30pm or 7:00am -11:30am on weekends

## 2024-08-01 NOTE — Consult Note (Addendum)
 Cardiology Consultation   Patient ID: [name] MRN: [id number]; DOB: 1936-06-19  Admit date: 07/31/2024 Date of Consult: 08/01/2024  PCP:  Joyce Norleen BROCKS, MD   [facility] Providers Cardiologist:  Alm Clay, MD  Cardiology APP:  Madie Jon Garre, [place]  Electrophysiologist:  Fonda Kitty, MD     Patient Profile: [name] is a 88 y.o. male with a hx of hypertension, atrial fibrillation, GI bleed, asthma, CHF, type 2 diabetes, CKD stage IIIb, GERD, AAA status post EVAR, and CAD since 1985 s/p CABG in 1997 with LIMA to LAD who is being seen 08/01/2024 for the evaluation of anticoagulation at the request of Sebastian Moles MD.  History of Present Illness: [name] is an 88 year old male with prior cardiac history listed below.  The patient has a history of statin intolerances and reported an intolerance to Repatha . He is currently on Zetia  and fenofibrate .  Was also reportedly previously on Nexletol .  Antianginal therapy includes amlodipine  and ranolazine .  Is not on a beta-blocker due to concerns of bradycardia and fatigue.  On 08/2021 presented to the emergency department for chest pain.  A catheterization was done and showed 100% stenosis in the ostial to proximal LAD, 99% stenosis in the distal Lcx, prior stents in the RCA and LCx were patent.  A stent was placed in the distal LCx.  Was initially diagnosed with A-fib on 12/2022.  He has tolerated Eliquis  in the past but this had to be discontinued due to concerns of hematuria and melena.  The patient has been followed by urology for his hematuria.  Had a cystoscopy on 11/2023 that found a friable prostatic urethra.  Cytology was negative for malignancy.  On 02/2024 was seen for a watchman evaluation because of concerns of the patients melena and hematuria.  The patient was recommended to get a Watchman but wanted to consider his options before proceeding.  On 05/24/2024 an echocardiogram was done and showed  a mildly reduced LVEF of 45 to 50%, global hypokinesis, moderate LVH, mildly reduced RV systolic function, severely dilated left atrium, mild to moderate MR, moderate MAC, moderate thickening of aortic valve, and mild to moderate aortic regurgitation.   Patient was hospitalized following a mechanical fall.  Imaging showed 7.7 cm AAA and a L2 endplate fracture with a paraspinal hematoma.  Is being followed by vascular surgery and neurosurgery.  On interview patient reported that he fell last Thursday and was having pain in his abdomen and back.  Reported that the fall was a mechanical fall from his shoes. Denies any chest pain, shortness of breath, dyspnea on exertion, and worsening lower extremity edema.  Discussed with patient and he reported that he does not want to get a Watchman procedure performed.  Also discussed restarting Eliquis  for his atrial fibrillation and patient declined to do this.  He is aware of the risks of not being on anticoagulation for atrial fibrillation.  Labs showed anemia with a hemoglobin of 9.5, potassium of 4.7, sodium of 138, creatinine of 1.61, and elevated BUN of 24.  Past Medical History:  Diagnosis Date   Adenomatous colon polyp    Ankle edema    Chronic   Asthma    CAD S/P percutaneous coronary angioplasty 2004   a) '04: Staged Taxus DES PCI to RCA (2 Taxus 2.75 x 32 & 12) and Cx-OM (Taxus 3 x 20);;'07 - PCI to pRCA ISR- Cypher DES; c) 05/2014: pPCI pRCA stentISR (Promus DES 3 x 8) OM1 distal  to stent (Promus DES 2.75 x 12 - 3.1); 11/'22: distal LCx stent edge 99% -> Onyx Frontier DES 2.5 x 15 - 2.8 (overlaps prior stent)   CAD, multiple vessel 1985   Most recent July 2018: Admitted for non-STEMI. Occluded LAD with patent LIMA (SP1 now occluded).  Patent stents in proximal and mid RCA as well as circumflex-OM 3. Known occlusion of SVG-OM. EF 45-50%   CHF (congestive heart failure) (HCC)    Cholelithiasis - with cholangitis & choledocholithiasis    status post  ERCP with removal of calculi and biliary stent placement.   Chronic anemia    On iron supplement; history of positive guaiac - negative colonoscopy in 1996.; Thought to be related to hemorrhoids; status post hemorrhoidectomy   Chronic back pain     multiple surgeries; C-spine and lumbar   Diabetes mellitus    no meds- pt states he is no longer DM   Diverticulitis of colon 1996   Diverticulosis    Dyslipidemia, goal LDL below 70    Erectile dysfunction    Exertional dyspnea    Chronic baseline SOB with ambulation   GERD (gastroesophageal reflux disease)    Grade II diastolic dysfunction    H/O: pneumonia 11/2012   Hemorrhoids    Hiatal hernia    History of: ST elevation myocardial infarction (STEMI) involving left circumflex coronary artery with complication 1985   PTCA-circumflex; PCI in 1991   Hypertension    Moderate aortic stenosis by prior echocardiogram 07/2015   Normal LV function - EF 55-60%.. Abnormal relaxation. Mild-moderate aortic stenosis (peak/mean gradient 18/10 mmH)   Osteoarthritis of both knees    And back; multiple back surgeries, right knee arthroplasty and left knee arthroscopic surgery x2   Pneumonia 2016   S/P AAA (abdominal aortic aneurysm) repair 08/30/2012   s/p EVAR   S/P CABG x 2 1997   LIMA-LAD, SVG-OM   Sleep apnea    pt. states he was told to return for f/u, to be fitted for Cpap, but pt. reports that he didn't follow up-no cpap used   Statin myopathy 09/30/2015    Past Surgical History:  Procedure Laterality Date   Abdominal and Lower Extremity Arterial Ultrasound  08/23/2012; 10/10/2013   Normal ABIs. Nonocclusive lower extremity disease. 4.2 cm x 4.3 cm infrarenal AAA;; 4.4 cm x 4.3 cm (essentially stable)    ABDOMINAL AORTIC ENDOVASCULAR STENT GRAFT N/A 08/13/2015   Procedure: ABDOMINAL AORTIC ENDOVASCULAR STENT GRAFT;  Surgeon: Lynwood JONETTA Collum, MD;  Location: [facility] OR;  Service: Vascular;  Laterality: N/A;   Anterior cervical plating  04/23/2010    At C4-5 and a C6-7 utilizing two separate Biomet MaxAn plates.   ANTERIOR LAT LUMBAR FUSION Left 11/22/2012   Procedure: ANTERIOR LATERAL LUMBAR FUSION 1 LEVEL;  Surgeon: Alm GORMAN Molt, MD;  Location: [facility] NEURO ORS;  Service: Neurosurgery;  Laterality: Left;  Anterior Lateral Lumbar Fusion Lumbar Three-Four   BACK SURGERY  1979 & x 10   pt. remarks, I have had about 10 back surgeries   BILIARY STENT PLACEMENT N/A 07/03/2014   Procedure: BILIARY STENT PLACEMENT;  Surgeon: Lamar JONETTA Aho, MD;  Location[facility] ENDOSCOPY;  Service: Endoscopy;  Laterality: N/A;   CARPAL TUNNEL RELEASE Left 11/16/2019   Procedure: LEFT CARPAL TUNNEL RELEASE;  Surgeon: Vernetta Lonni GRADE, MD;  Location: [facility] ORS;  Service: Orthopedics;  Laterality: Left;   CARPAL TUNNEL RELEASE Right 01/10/2023   Procedure: RIGHT CARPAL TUNNEL RELEASE;  Surgeon: Molt Alm GORMAN, MD;  Location: [facility] OR;  Service: Neurosurgery;  Laterality: Right;   CATARACT EXTRACTION     Cervical arthrodesis  04/23/2010   Anterior cervical arthrodesis, C4-5, C6-7 utilizing 7-mm PEEK interbody cage packed with local autograft & Antifuse putty at C4-5 & an 8-mm cage at C6-7.   CERVICAL DISCECTOMY  04/23/2010   Decompressive anterior carvical diskectomy. C4-5, C6-7   CHOLECYSTECTOMY N/A 05/23/2015   Procedure: LAPAROSCOPIC CHOLECYSTECTOMY WITH INTRAOPERATIVE CHOLANGIOGRAM;  Surgeon: Alm Angle, MD;  Location: [facility] ORS;  Service: General;  Laterality: N/A;   COLONOSCOPY  1996   CORONARY ANGIOPLASTY  1985,1991,15   1985 lateral STEMI Circumflex PTCA;    CORONARY ARTERY BYPASS GRAFT  1997   LIMA-LAD, SVG-OM (SVG known to be occluded prior to 2004)   CORONARY STENT INTERVENTION N/A 08/20/2021   Procedure: CORONARY STENT INTERVENTION;  Surgeon: Court Dorn PARAS, MD;  Location: [facility] INVASIVE CV LAB;  Service: Cardiovascular:  dLCx distal Edge 99% => Onyx Frontier DES 2.5 x 15 (2.8 mm) overlaps prior stent distally.   ERCP N/A 07/03/2014   Procedure:  ENDOSCOPIC RETROGRADE CHOLANGIOPANCREATOGRAPHY (ERCP);  Surgeon: Lamar JONETTA Aho, MD;  Location: [facility] ENDOSCOPY;  Service: Endoscopy;  Laterality: N/A;   ERCP N/A 07/05/2014   Procedure: ENDOSCOPIC RETROGRADE CHOLANGIOPANCREATOGRAPHY (ERCP);  Surgeon: Lamar JONETTA Aho, MD;  Location: [facility] ENDOSCOPY;  Service: Endoscopy;  Laterality: N/A;   ERCP N/A 06/30/2015   Procedure: ENDOSCOPIC RETROGRADE CHOLANGIOPANCREATOGRAPHY (ERCP);  Surgeon: Gwendlyn ONEIDA Buddy, MD;  Location: [facility];  Service: Endoscopy;  Laterality: N/A;   EYE SURGERY     IR ANGIOGRAM EXTREMITY LEFT  01/18/2017   IR ANGIOGRAM PELVIS SELECTIVE OR SUPRASELECTIVE  01/18/2017   IR ANGIOGRAM SELECTIVE EACH ADDITIONAL VESSEL  01/18/2017   IR AORTAGRAM ABDOMINAL SERIALOGRAM  01/18/2017   IR EMBO ARTERIAL NOT HEMORR HEMANG INC GUIDE ROADMAPPING  01/18/2017   IR RADIOLOGIST EVAL & MGMT  01/04/2017   IR RADIOLOGIST EVAL & MGMT  10/26/2017   IR RADIOLOGIST EVAL & MGMT  05/24/2018   IR RADIOLOGIST EVAL & MGMT  06/19/2019   IR RADIOLOGIST EVAL & MGMT  06/11/2020   IR RADIOLOGIST EVAL & MGMT  07/02/2021   IR RADIOLOGIST EVAL & MGMT  06/22/2022   IR RADIOLOGIST EVAL & MGMT  07/08/2023   IR US  GUIDE VASC ACCESS RIGHT  01/18/2017   KNEE ARTHROSCOPY Left    x 2   LEFT HEART CATH AND CORS/GRAFTS ANGIOGRAPHY  06/2003   None Occluded vein graft to OM; diffuse RCA disease in the mid vessel, 80% circumflex-OM stenosis; follow on AV groove circumflex with sequential 90% stenoses and intervening saccular dilation    LEFT HEART CATH AND CORS/GRAFTS ANGIOGRAPHY  12/2008   4 abnormal Myoview  showing apical thinning (possibly due to apical LAD 95%) : 100% Occluded LAD after SV1, distal LAD grafted via LIMA -apical 95% . Cx -OM1 w/patent stent extending into OM 1 . Follow on Cx - 70-80% - non-amenable PCI. RCA widely patent 3 overlapping stents in mRCA w/less than 40% stenosisin RPL; SVG-OM known occluded    LEFT HEART CATH AND CORS/GRAFTS ANGIOGRAPHY N/A  04/20/2017   Procedure: Left Heart Cath and Cors/Grafts Angiography;  Surgeon: Court Dorn PARAS, MD;  Location: [facility] INVASIVE CV LAB;  LAD now occluded prior to SP1.LIMA-LAD. Patent RCA and circumflex stents. EF 45-50%. Relatively stable.    LEFT HEART CATH AND CORS/GRAFTS ANGIOGRAPHY N/A 08/20/2021   Procedure: LEFT HEART CATH AND CORS/GRAFTS ANGIOGRAPHY;  Surgeon: Court Dorn PARAS, MD;  Location: [facility] INVASIVE CV  LAB;  Service: Cardiovascular: CULPRIT 99% dLCx just after stent (Overlapping DES PCI) - otw patent LCx stent, patent RCA stent & Patent LIMA-LAD with CTO of prox LAD.   LEFT HEART CATHETERIZATION WITH CORONARY ANGIOGRAM N/A 05/15/2014   Procedure: LEFT HEART CATHETERIZATION WITH CORONARY ANGIOGRAM;  Surgeon: Victory LELON Claudene DOUGLAS, MD;  Location: [facility] CATH LAB;  Service: Cardiovascular;  Laterality: N/A;   LEFT HEART CATHETERIZATION WITH CORONARY/GRAFT ANGIOGRAM N/A 06/28/2014   Procedure: LEFT HEART CATHETERIZATION WITH EL BILE;  Surgeon: Debby DELENA Sor, MD;  Location: [facility] CATH LAB;  Service: Cardiovascular;  Laterality: N/A;   LUMBAR PERCUTANEOUS PEDICLE SCREW 1 LEVEL N/A 11/22/2012   Procedure: LUMBAR PERCUTANEOUS PEDICLE SCREW 1 LEVEL;  Surgeon: Alm GORMAN Molt, MD;  Location: [facility] NEURO ORS;  Service: Neurosurgery;  Laterality: N/A;  Lumbar Three-Four Percutaneous Pedicle Screw, Lateral approach   MINOR HEMORRHOIDECTOMY     NM MYOVIEW  LTD  05/26/2021   Lexiscan : Intermediate risk (similar findings to last study), with exception of reduced EF roughly 40%..  Prior basal inferior-inferolateral infarct without peri-infarct ischemia.   ->  Echo ordered -> (EF 45 to 50%)-when compared to prior Myoview , infarct appears similar.   PERCUTANEOUS CORONARY STENT INTERVENTION (PCI-S)  06/2003   PCI - RCA 2 overlapping Taxus DES 2.75 mm x 32 mm and 2.75 mm x 12 mm (3.0 mm); PCI-Cx-OM1 - Taxus DES 3.0 mm x 20 mm (3.1 mm);    PERCUTANEOUS CORONARY STENT INTERVENTION (PCI-S)  12/2005   80% ISR in  proximal Taxus stent in RCA -- covered proximally with Cypher DES 3.0 mm x 12 mm   PERCUTANEOUS CORONARY STENT INTERVENTION (PCI-S) N/A 05/17/2014   Procedure: PERCUTANEOUS CORONARY STENT INTERVENTION (PCI-S);  Surgeon: Victory LELON Claudene DOUGLAS, MD;  Location: [facility] CATH LAB: PCI pRCA stent ISR - Promus DES 3.0 x 8 (3.25), OM distal stent edge - Promus DES 2.75 x 12 (3.1)   PERIPHERAL VASCULAR CATHETERIZATION  11/03/2016   Procedure: Embolization;  Surgeon: Penne Lonni Colorado, MD;  Location: [facility] INVASIVE CV LAB;  Service: Cardiovascular;;   POSTERIOR CERVICAL FUSION/FORAMINOTOMY N/A 05/02/2013   Procedure: POSTERIOR CERVICAL FUSION/FORAMINOTOMY CERVICAL SEVEN THORACIC-ONE;  Surgeon: Alm GORMAN Molt, MD;  Location: [facility] NEURO ORS;  Service: Neurosurgery;  Laterality: N/A;  POSTERIOR CERVICAL FUSION/FORAMINOTOMY CERVICAL SEVEN THORACIC-ONE   SPHINCTEROTOMY N/A 06/30/2015   Procedure: SPHINCTEROTOMY;  Surgeon: Gwendlyn ONEIDA Buddy, MD;  Location: [facility] ENDOSCOPY;  Service: Endoscopy;  Laterality: N/A;   TOTAL KNEE ARTHROPLASTY Right    TOTAL KNEE ARTHROPLASTY Left 12/20/2016   Procedure: LEFT TOTAL KNEE ARTHROPLASTY;  Surgeon: Marcey Raman, MD;  Location: [facility] OR;  Service: Orthopedics;  Laterality: Left;   TRANSTHORACIC ECHOCARDIOGRAM  06/10/2021   EF 45 to 50%.  Mildly reduced function.  Mild hypokinesis of basal-mid inferior and inferolateral wall consistent with prior infarct.  Severe LA dilation.  Mild to moderate MR with moderate MAC.  Mild calcific AS (mean gradient 11 mmHg) --> LV function seems unchanged.  Diastolic function improved.  Wall motion abnormality unchanged.  AS unchanged.   TRANSTHORACIC ECHOCARDIOGRAM  07/2018   Normal LV size.  EF 50 to 55% with mild HK of inferolateral wall.  GRII DD.  Mild AS with mean gradient 15 mm or greater.  Mild ascending aortic dilation.  Mildly increased PA pressures of 41 mmHg   UPPER GI ENDOSCOPY  07/03/2019   VISCERAL ANGIOGRAM  11/03/2016   Procedure: Visceral  Angiogram;  Surgeon: Penne Lonni Colorado, MD;  Location: [facility] INVASIVE CV LAB;  Service: Cardiovascular;;     Home Medications:  Prior to Admission medications   Medication Sig Start Date End Date Taking? Authorizing Provider  acetaminophen  (TYLENOL ) 500 MG tablet Take 1,000 mg by mouth every 4 (four) hours as needed for headache (pain).   Yes [provider]  amLODipine  (NORVASC ) 5 MG tablet Take 1 tablet by mouth once daily 11/09/23  Yes Anner Alm ORN, MD  aspirin  EC 81 MG tablet Take 81 mg by mouth daily.   Yes [provider]  Choline Fenofibrate  (FENOFIBRIC ACID ) 135 MG CPDR Take 1 tablet by mouth daily. 05/01/24  Yes Duke, Jon Garre, PA  ezetimibe  (ZETIA ) 10 MG tablet Take 1 tablet by mouth once daily 10/10/23  Yes Anner Alm ORN, MD  ferrous sulfate  325 (65 FE) MG tablet Take 325 mg by mouth daily.   Yes [provider]  finasteride  (PROSCAR ) 5 MG tablet Take 1 tablet (5 mg total) by mouth daily. 11/15/23  Yes Shona Layman BROCKS, MD  fluticasone -salmeterol (ADVAIR) 100-50 MCG/ACT AEPB Inhale 1 puff into the lungs 2 (two) times daily. Patient taking differently: Inhale 1 puff into the lungs 2 (two) times daily as needed (bronchitis). 08/24/23  Yes Tysinger, Alm RAMAN, PA-C  methocarbamol  (ROBAXIN ) 500 MG tablet Take 500 mg by mouth every 8 (eight) hours.   Yes [provider]  Multiple Vitamins-Minerals (PRESERVISION AREDS 2+MULTI VIT) CAPS Take 1 capsule by mouth 2 (two) times daily.   Yes [provider]  pantoprazole  (PROTONIX ) 40 MG tablet Take 1 tablet by mouth once daily 07/06/23  Yes Anner Alm ORN, MD  ranolazine  (RANEXA ) 500 MG 12 hr tablet Take 1 tablet by mouth twice daily Patient taking differently: Take 500 mg by mouth daily. 02/29/24  Yes Anner Alm ORN, MD  valsartan -hydrochlorothiazide  (DIOVAN -HCT) 320-12.5 MG tablet Take 1 tablet by mouth once daily 06/18/24  Yes Lalonde, John C, MD    Scheduled Meds:  acetaminophen   1,000  mg Oral Q6H   docusate sodium   100 mg Oral BID   finasteride   5 mg Oral Daily   insulin  aspart  0-15 Units Subcutaneous TID WC   insulin  aspart  0-5 Units Subcutaneous QHS   methocarbamol   500 mg Oral Q8H   Or   methocarbamol  (ROBAXIN ) injection  500 mg Intravenous Q8H    morphine  injection  4 mg Intravenous Once   pantoprazole   40 mg Oral Daily   Continuous Infusions:  lactated ringers  100 mL/hr at 08/01/24 0118   PRN Meds: albuterol , hydrALAZINE , metoprolol  tartrate, morphine  injection, ondansetron  **OR** ondansetron  (ZOFRAN ) IV, polyethylene glycol, traMADol   Allergies:    Allergies  Allergen Reactions   Roxicodone  [Oxycodone ] Shortness Of Breath and Cough   Statins Other (See Comments)    Myalgias     Zestril [Lisinopril] Cough   Eliquis  [Apixaban ] Other (See Comments)    Blood clots in urine   Welchol [Colesevelam Hcl] Itching    Social History:   Social History   Socioeconomic History   Marital status: Married    Spouse name: Not on file   Number of children: Not on file   Years of education: Not on file   Highest education level: Not on file  Occupational History   Not on file  Tobacco Use   Smoking status: Former    Current packs/day: 0.00    Average packs/day: 3.0 packs/day for 45.0 years (135.0 ttl pk-yrs)    Types: Cigarettes    Start date: 10/04/1938    Quit date: 10/05/1983  Years since quitting: 40.8   Smokeless tobacco: Never  Vaping Use   Vaping status: Never Used  Substance and Sexual Activity   Alcohol use: Yes    Alcohol/week: 7.0 - 14.0 standard drinks of alcohol    Types: 7 - 14 Cans of beer per week    Comment: 1-2 beers per day   Drug use: No   Sexual activity: Not Currently  Other Topics Concern   Not on file  Social History Narrative   He is married, father of two, grandfather to four, great grandfather to two.    Not really getting much exercise now, do to his significant back and hip pain.    He does not smoke and only has an  alcoholic beverage.    Social Drivers of Corporate Investment Banker Strain: Not on file  Food Insecurity: No Food Insecurity (08/01/2024)   Hunger Vital Sign    Worried About Running Out of Food in the Last Year: Never true    Ran Out of Food in the Last Year: Never true  Transportation Needs: No Transportation Needs (08/01/2024)   PRAPARE - Administrator, Civil Service (Medical): No    Lack of Transportation (Non-Medical): No  Physical Activity: Not on file  Stress: Not on file  Social Connections: Socially Integrated (08/01/2024)   Social Connection and Isolation Panel    Frequency of Communication with Friends and Family: Three times a week    Frequency of Social Gatherings with Friends and Family: Twice a week    Attends Religious Services: More than 4 times per year    Active Member of [employer] or Organizations: Yes    Attends Engineer, Structural: More than 4 times per year    Marital Status: Married  Catering Manager Violence: Not At Risk (08/01/2024)   Humiliation, Afraid, Rape, and Kick questionnaire    Fear of Current or Ex-Partner: No    Emotionally Abused: No    Physically Abused: No    Sexually Abused: No    Family History:    Family History  Problem Relation Age of Onset   Arthritis Mother    Diabetes Father    Heart disease Father    Stroke Sister    Hypertension Sister    Heart disease Sister    Diabetes Sister    Breast cancer Sister    Heart disease Brother    Ulcers Brother    Colon cancer Neg Hx      ROS:  Please see the history of present illness.   All other ROS reviewed and negative.     Physical Exam/Data: Vitals:   07/31/24 2300 08/01/24 0007 08/01/24 0604 08/01/24 1004  BP: (!) 143/69 (!) 145/83 137/81 124/60  Pulse: 61 74 65 82  Resp: 18 14 14 16   Temp: 97.9 F (36.6 C) 98.2 F (36.8 C) (!) 97.5 F (36.4 C) 98.3 F (36.8 C)  TempSrc: Oral Oral Oral Oral  SpO2: 98% 98% 99% 99%    Intake/Output Summary  (Last 24 hours) at 08/01/2024 1453 Last data filed at 08/01/2024 1246 Gross per 24 hour  Intake 1240 ml  Output 100 ml  Net 1140 ml      04/17/2024    1:54 PM 03/01/2024    1:30 PM 02/29/2024    2:18 PM  Last 3 Weights  Weight (lbs) 197 lb 3.2 oz 197 lb 196 lb 12.8 oz  Weight (kg) 89.449 kg 89.359 kg 89.268 kg  There is no height or weight on file to calculate BMI.  General:  Well nourished, well developed, in no acute distress HEENT: normal Neck: no JVD Vascular: No carotid bruits; Distal pulses 2+ bilaterally Cardiac:  normal S1, S2; RRR; no murmur. Lungs:  clear to auscultation bilaterally, no wheezing, rhonchi or rales  Abd: soft, nontender, no hepatomegaly  Ext: no edema Musculoskeletal:  No deformities. Skin: warm and dry  Neuro:   no focal abnormalities noted Psych:  Normal affect   EKG:  The EKG was personally reviewed and demonstrates:  Has not had EKG performed Telemetry:  Telemetry was personally reviewed and demonstrates:  Is not on telemetry  Relevant CV Studies:   Laboratory Data: High Sensitivity Troponin:  No results for input(s): TROPONINIHS in the last 720 hours.   Chemistry Recent Labs  Lab 07/31/24 1558 08/01/24 0439  NA 137 138  K 4.3 4.7  CL 102 105  CO2 23 24  GLUCOSE 118* 103*  BUN 29* 24*  CREATININE 1.73* 1.61*  CALCIUM  9.7 9.3  GFRNONAA 38* 41*  ANIONGAP 12 9    Recent Labs  Lab 07/31/24 1558  PROT 6.5  ALBUMIN 3.7  AST 23  ALT 13  ALKPHOS 39  BILITOT 1.2   Lipids No results for input(s): CHOL, TRIG, HDL, LABVLDL, LDLCALC, CHOLHDL in the last 168 hours.  Hematology Recent Labs  Lab 07/31/24 1558 08/01/24 0439  WBC 7.3 6.8  RBC 3.20* 2.98*  HGB 10.0* 9.5*  HCT 30.3* 28.4*  MCV 94.7 95.3  MCH 31.3 31.9  MCHC 33.0 33.5  RDW 15.1 15.1  PLT 202 204   Thyroid No results for input(s): TSH, FREET4 in the last 168 hours.  BNPNo results for input(s): BNP, PROBNP in the last 168 hours.  DDimer No  results for input(s): DDIMER in the last 168 hours.  Radiology/Studies:  CT Angio Chest/Abd/Pel for Dissection W and/or Wo Contrast Result Date: 07/31/2024 CLINICAL DATA:  Acute aortic syndrome suspected.  Fall. EXAM: CT ANGIOGRAPHY CHEST, ABDOMEN AND PELVIS TECHNIQUE: Non-contrast CT of the chest was initially obtained. Multidetector CT imaging through the chest, abdomen and pelvis was performed using the standard protocol during bolus administration of intravenous contrast. Multiplanar reconstructed images and MIPs were obtained and reviewed to evaluate the vascular anatomy. RADIATION DOSE REDUCTION: This exam was performed according to the departmental dose-optimization program which includes automated exposure control, adjustment of the mA and/or kV according to patient size and/or use of iterative reconstruction technique. CONTRAST:  80mL OMNIPAQUE  IOHEXOL  350 MG/ML SOLN COMPARISON:  CT abdomen pelvis dated 07/01/2023. FINDINGS: CTA CHEST FINDINGS Cardiovascular: Mild cardiomegaly. No pericardial effusion. There is advanced 3 vessel coronary vascular calcification and calcification of the mitral annulus. Moderate atherosclerotic calcification of the thoracic aorta. No abdominal dilatation or dissection. The origins of the great vessels of the aortic arch and the central pulmonary arteries appear patent. Mediastinum/Nodes: No hilar or mediastinal adenopathy. Moderate size hiatal hernia. The esophagus is grossly unremarkable. No mediastinal fluid collection. Lungs/Pleura: Trace right pleural effusion. There is minimal subpleural atelectasis of the right lower lobe. There are bibasilar subpleural linear atelectasis/scarring. No consolidative changes or pneumothorax. The central airways are patent. Musculoskeletal: Osteopenia with degenerative changes of the spine. Median sternotomy wires. No acute osseous pathology. Review of the MIP images confirms the above findings. CTA ABDOMEN AND PELVIS FINDINGS  VASCULAR Aorta: Advanced atherosclerotic calcification of the abdominal aorta. There is a 7.7 cm infrarenal abdominal aortic aneurysm status post endovascular stent graft repair. The excluded  aneurysm sac has increased in size since the prior CT (previously measuring approximately 7 cm in diameter. Evaluation is limited as the precontrast and delayed images of the abdomen are not performed. Faint high attenuating focus within the sac at 8 o'clock position (228/11) may be chronic. An endoleak is not excluded. Celiac: Atherosclerotic calcification of the origin of the celiac trunk. The celiac artery and its major branches are patent. SMA: Atherosclerotic calcification of the origin of the SMA. The SMA is patent. Renals: Atherosclerotic calcification of the origins of the renal arteries. The renal arteries are patent. IMA: The origin IMA is not visualized, likely occluded. Inflow: The iliac arteries are patent. No aneurysmal dilatation or dissection. Veins: No obvious venous abnormality within the limitations of this arterial phase study. Review of the MIP images confirms the above findings. NON-VASCULAR No intra-abdominal free air or free fluid. Hepatobiliary: The liver is unremarkable. Mild biliary dilatation, post cholecystectomy. Pancreas: Unremarkable. No pancreatic ductal dilatation or surrounding inflammatory changes. Spleen: Normal in size without focal abnormality. Adrenals/Urinary Tract: The right adrenal glands unremarkable. There is a 2.6 cm left adrenal myelolipoma. Mild right renal parenchyma atrophy. Bilateral renal inferior pole scarring. There is no hydronephrosis on either side. The urinary bladder is grossly unremarkable. Stomach/Bowel: There is sigmoid diverticulosis. There is no bowel obstruction or active inflammation. The appendix is normal. Lymphatic: No adenopathy. Reproductive: The prostate is grossly remarkable Other: Right paraspinal hematoma extends from L2-L5 along the right psoas muscle  and measures approximately 3.5 x 6 cm in greatest axial dimensions. Musculoskeletal: Osteopenia with degenerative changes of the spine. Minimally displaced triangular fracture of the anterior superior L2 with extension of the fracture into the L2 superior endplate. L2-L3 disc spacer and posterior fusion. Review of the MIP images confirms the above findings. IMPRESSION: 1. No acute intrathoracic pathology. 2. A 7.7 cm infrarenal abdominal aortic aneurysm status post prior endovascular stent graft repair. The excluded aneurysm sac has increased in size since the prior CT. An endoleak is not excluded. Vascular surgery consult is advised. 3. Minimally displaced triangular fracture of the anterior superior L2 with extension of the fracture into the L2 superior endplate. 4. Right paraspinal hematoma extends from L2-L5 along the right psoas muscle and measures approximately 3.5 x 6 cm in greatest axial dimensions. 5. Sigmoid diverticulosis. No bowel obstruction. Normal appendix. 6.  Aortic Atherosclerosis (ICD10-I70.0). Electronically Signed   By: Vanetta Chou M.D.   On: 07/31/2024 18:49   CT L-SPINE NO CHARGE Result Date: 07/31/2024 CLINICAL DATA:  Fall.  Trauma to the back.  Back pain. EXAM: CT THORACIC AND LUMBAR SPINE WITHOUT CONTRAST TECHNIQUE: Multidetector CT imaging of the thoracic and lumbar spine was performed without contrast. Multiplanar CT image reconstructions were also generated. RADIATION DOSE REDUCTION: This exam was performed according to the departmental dose-optimization program which includes automated exposure control, adjustment of the mA and/or kV according to patient size and/or use of iterative reconstruction technique. COMPARISON:  CT abdomen pelvis dated 07/01/2023. FINDINGS: CT THORACIC SPINE FINDINGS Alignment: No acute subluxation. Vertebrae: No acute fracture.  Osteopenia. Paraspinal and other soft tissues: No paraspinal fluid collection hematoma. Disc levels: No acute findings.   Degenerative changes. CT LUMBAR SPINE FINDINGS Segmentation: 5 lumbar type vertebrae. Alignment: No acute subluxation. Vertebrae: Minimally displaced fracture of the anterior corner of the superior endplate of L2. There is extension of the fracture to the superior endplate. No other acute fracture. There is advanced osteopenia which limits evaluation for fracture. Paraspinal and other soft tissues: Right  paraspinal hematoma extends from L2 down to L5 posterior to the right psoas muscle. Disc levels: L2-L3 and L3-L4 disc spacers. There is L2-L3 posterior fusion. Multilevel degenerative changes and facet arthropathy. IMPRESSION: 1. Minimally displaced fracture of the anterior corner of the superior endplate of L2. 2. Right paraspinal hematoma extends from L2 down to L5 posterior to the right psoas muscle. 3. No acute/traumatic thoracic spine pathology. Electronically Signed   By: Vanetta Chou M.D.   On: 07/31/2024 18:35   CT T-SPINE NO CHARGE Result Date: 07/31/2024 CLINICAL DATA:  Fall.  Trauma to the back.  Back pain. EXAM: CT THORACIC AND LUMBAR SPINE WITHOUT CONTRAST TECHNIQUE: Multidetector CT imaging of the thoracic and lumbar spine was performed without contrast. Multiplanar CT image reconstructions were also generated. RADIATION DOSE REDUCTION: This exam was performed according to the departmental dose-optimization program which includes automated exposure control, adjustment of the mA and/or kV according to patient size and/or use of iterative reconstruction technique. COMPARISON:  CT abdomen pelvis dated 07/01/2023. FINDINGS: CT THORACIC SPINE FINDINGS Alignment: No acute subluxation. Vertebrae: No acute fracture.  Osteopenia. Paraspinal and other soft tissues: No paraspinal fluid collection hematoma. Disc levels: No acute findings.  Degenerative changes. CT LUMBAR SPINE FINDINGS Segmentation: 5 lumbar type vertebrae. Alignment: No acute subluxation. Vertebrae: Minimally displaced fracture of the  anterior corner of the superior endplate of L2. There is extension of the fracture to the superior endplate. No other acute fracture. There is advanced osteopenia which limits evaluation for fracture. Paraspinal and other soft tissues: Right paraspinal hematoma extends from L2 down to L5 posterior to the right psoas muscle. Disc levels: L2-L3 and L3-L4 disc spacers. There is L2-L3 posterior fusion. Multilevel degenerative changes and facet arthropathy. IMPRESSION: 1. Minimally displaced fracture of the anterior corner of the superior endplate of L2. 2. Right paraspinal hematoma extends from L2 down to L5 posterior to the right psoas muscle. 3. No acute/traumatic thoracic spine pathology. Electronically Signed   By: Vanetta Chou M.D.   On: 07/31/2024 18:35   CT Maxillofacial Wo Contrast Result Date: 07/31/2024 EXAM: CT OF THE FACE WITHOUT CONTRAST 07/31/2024 05:46:11 PM TECHNIQUE: CT of the face was performed without the administration of intravenous contrast. Multiplanar reformatted images are provided for review. Automated exposure control, iterative reconstruction, and/or weight based adjustment of the mA/kV was utilized to reduce the radiation dose to as low as reasonably achievable. COMPARISON: None available. CLINICAL HISTORY: Facial trauma, blunt. 88 yo male tripped while walking through doorway on Thursday. States hit head on rocking chair, light fixture fell on back. Reports headache. Reports mid back pain and generalized abd pain. FINDINGS: FACIAL BONES: Mandibular heads are normally positioned. No mandibular fracture. Pterygoid plates and zygomatic arches appear intact. No acute findings nasal bone fracture. No suspicious bone lesion. ORBITS: Globes are intact. No acute traumatic injury. No inflammatory change. SINUSES AND MASTOIDS: Mastoid air cells are clear. Mild mucosal thickening of the sinuses without fluid level or sinus wall fracture. SOFT TISSUES: No acute abnormality. IMPRESSION: 1. No  acute facial fracture. Electronically signed by: Luke Bun MD 07/31/2024 06:15 PM EDT RP Workstation: [id number]   DG Knee Complete 4 Views Right Result Date: 07/31/2024 EXAM: 4 OR MORE VIEW(S) XRAY OF THE RIGHT KNEE 07/31/2024 05:48:00 PM COMPARISON: None available. CLINICAL HISTORY: Trauma. Tripped while walking through doorway on Thursday. Bilateral knees and right elbow pain. FINDINGS: BONES AND JOINTS: Right knee replacement with normal alignment. No acute fracture. No focal osseous lesion. No joint dislocation. Trace knee  effusion. SOFT TISSUES: Vascular calcifications. IMPRESSION: 1. No acute fracture. 2. Right knee arthroplasty with normal alignment. 3. Trace knee effusion. Electronically signed by: Luke Bun MD 07/31/2024 06:06 PM EDT RP Workstation: [id number]   DG Knee Complete 4 Views Left Result Date: 07/31/2024 EXAM: 4 OR MORE VIEW(S) XRAY OF THE LEFT KNEE 07/31/2024 05:48:00 PM COMPARISON: None available. CLINICAL HISTORY: trauma. Tripped while walking through doorway on thursday. Bilateral knees and right elbow pain. FINDINGS: BONES AND JOINTS: Knee replacement with normal alignment. No definitive acute displaced fracture. No focal osseous lesion. Trace knee effusion. SOFT TISSUES: Vascular calcifications. The soft tissues are unremarkable. IMPRESSION: 1. No acute displaced fracture. 2. Trace knee effusion. 3. Status post knee arthroplasty with normal alignment. Electronically signed by: Luke Bun MD 07/31/2024 06:05 PM EDT RP Workstation: [id number]   DG Elbow Complete Right Result Date: 07/31/2024 EXAM: 3 VIEW(S) XRAY OF THE RIGHT ELBOW COMPARISON: None available. CLINICAL HISTORY: trauma. Tripped while walking through doorway on thursday. Bilateral knees and right elbow pain. FINDINGS: BONES AND JOINTS: No definitive fracture or malalignment. No joint dislocation. No significant effusion. SOFT TISSUES: Soft tissue calcifications at the posterior elbow. Vascular calcifications.  IMPRESSION: 1. No acute fracture or malalignment. 2. No significant elbow joint effusion. Electronically signed by: Luke Bun MD 07/31/2024 06:03 PM EDT RP Workstation: [id number]   CT Head Wo Contrast Result Date: 07/31/2024 EXAM: CT HEAD WITHOUT CONTRAST 07/31/2024 05:46:11 PM TECHNIQUE: CT of the head was performed without the administration of intravenous contrast. Automated exposure control, iterative reconstruction, and/or weight based adjustment of the mA/kV was utilized to reduce the radiation dose to as low as reasonably achievable. COMPARISON: None available. CLINICAL HISTORY: Head trauma, intracranial venous injury suspected. 88 yo male tripped while walking through doorway on Thursday. States hit head on rocking chair, light fixture fell on back. Reports headache. Reports mid back pain and generalized abd pain. FINDINGS: BRAIN AND VENTRICLES: No acute territorial infarction, hemorrhage, or intracranial mass. Atrophy and mild chronic small vessel ischemic changes of the white matter. Nonenlarged ventricles. No extra-axial collection. Multiple small chronic cerebellar infarcts. Small chronic left frontal lobe infarct near the vertex. Small chronic infarct within the medial left temporal lobe, sagittal series 10 image 48. Chronic lacunar infarcts within the thalamus. Vertebral and carotid vascular calcification. ORBITS: No acute abnormality. SINUSES: No acute abnormality. SOFT TISSUES AND SKULL: No acute soft tissue abnormality. No skull fracture. IMPRESSION: 1. No acute intracranial abnormality. 2. Atrophy with mild chronic small vessel ischemic disease. 3. Multifocal chronic-appearing infarcts as described above. Electronically signed by: Luke Bun MD 07/31/2024 06:02 PM EDT RP Workstation: [id number]   CT Cervical Spine Wo Contrast Result Date: 07/31/2024 EXAM: CT CERVICAL SPINE WITHOUT CONTRAST 07/31/2024 05:46:11 PM TECHNIQUE: CT of the cervical spine was performed without the  administration of intravenous contrast. Multiplanar reformatted images are provided for review. Automated exposure control, iterative reconstruction, and/or weight based adjustment of the mA/kV was utilized to reduce the radiation dose to as low as reasonably achievable. COMPARISON: CT cervical spine 02/08/2011. CLINICAL HISTORY: Neck trauma (Age >= 65y). 88 yo male tripped while walking through doorway on Thursday. States hit head on rocking chair, light fixture fell on back. Reports headache. Reports mid back pain and generalized abd pain. FINDINGS: CERVICAL SPINE: BONES AND ALIGNMENT: Small focal lucency involving the left lateral mass of C2, present on the 2012 study, possibly a nutrient vessel foramen. Anterior cervical fusion hardware at C4-C5 and C6-C7. Posterior instrumented fusion from C6 to T1. Solid osseous fusion  from C4 to C7 levels. No solid osseous fusion demonstrated across the C7-T1 level. Hardware is intact. No compression fracture or displaced fracture demonstrated within the cervical spine. Sequelae of laminectomy at C7-T1. DEGENERATIVE CHANGES: Atherosclerosis at the carotid bifurcations. Degenerative endplate osteophytes at multiple levels. Disc bulge at C2-C3 resulting in at least mild spinal canal stenosis. Disc osteophyte complex at C3-C4 results in mild to moderate spinal canal stenosis. Facet arthrosis and uncovertebral hypertrophy at multiple levels resulting in significant foraminal stenosis, most pronounced at C3-C4. SOFT TISSUES: No prevertebral soft tissue swelling. IMPRESSION: 1. No acute abnormality of the cervical spine related to reported neck trauma. 2. Degenerative changes as above. 3. Anterior cervical fusion hardware at C4-5 and C6-7, with additional posterior instrumented fusion from C6 to T1. Solid osseous fusion from C4 to C7 levels. No solid osseous fusion demonstrated across the C7-T1 level. Hardware is intact. Electronically signed by: Donnice Mania MD 07/31/2024 05:59 PM  EDT RP Workstation: [id number]     Assessment and Plan:  ANGELO PRINDLE is a 88 y.o. male with a hx of hypertension, atrial fibrillation, GI bleed, asthma, CHF, type 2 diabetes, CKD stage IIIb, GERD, AAA status post EVAR, and CAD since 1985 s/p CABG in 1997 with LIMA to LAD who is being seen 08/01/2024 for the evaluation of anticoagulation at the request of Sebastian Moles MD.  Fall AAA L2 endplate fracture with a paraspinal hematoma Patient was hospitalized following a mechanical fall.  Imaging showed 7.7 cm AAA and a L2 endplate fracture with a paraspinal hematoma.  Is being followed by vascular surgery and neurosurgery.   Paroxysmal atrial fibrillation CHA2DS2-VASc Score = 6 [CHF History: 1, HTN History: 1, Diabetes History: 1, Stroke History: 0, Vascular Disease History: 1, Age Score: 2, Gender Score: 0].  Therefore, the patient's annual risk of stroke is 9.7 %.    Was initially diagnosed on 12/2022.  Patient is asymptomatic with atrial fibrillation.  Was previously discontinued due to concerns with hematuria.  Is followed by urology.  Denies any recent hematuria or melena. Was seen by Dr. Kennyth for consideration for a Watchman procedure.  Ordered EKG   Chronic HFmrEF On 05/24/2024 an echocardiogram was done and showed a mildly reduced LVEF of 45 to 50%, global hypokinesis, moderate LVH, mildly reduced RV systolic function, severely dilated left atrium, mild to moderate MR, moderate MAC, moderate thickening of aortic valve, and mild to moderate aortic regurgitation.  Patient denies any orthopnea, PND, worsening shortness of breath. Patient looks euvolemic on exam   CAD s/p CABG in 1997 with LIMA to LAD hyperlipidemia Denies any chest pain or shortness of breath Continue home Zetia  and fenofibrate .  Otherwise management per primary   Risk Assessment/Risk Scores:       [place]  Heart Association (NYHA) Functional Class NYHA Class I  CHA2DS2-VASc Score = 6   This indicates  a 9.7% annual risk of stroke. The patient's score is based upon: CHF History: 1 HTN History: 1 Diabetes History: 1 Stroke History: 0 Vascular Disease History: 1 Age Score: 2 Gender Score: 0    For questions or updates, please contact [facility] Please consult [email or web address] for contact info under     Signed, Morse Clause, PA-C  08/01/2024 2:53 PM Debby JONELLE Ao was seen by me today along with Zane Adam, PA-C. I have personally performed an evaluation on this patient.  My findings are as follows: 88 y.o. male with history of CAD s/p CABG, HTN, atrial fib and prior GI  bleeding, not on long term anticoagulation, CKD, DM, AAA s/p EVAR admitted after a fall injuring his back. L2 fracture. He has been seen by Neurosurgery and no plans for surgical intervention at this time.  He only c/o back pain.  No chest pain or dyspnea No EKG on file Echo August 2025 with LVEF=45-50%. Mild to moderate AI and MR.   Data: No EKG available to review since admission Labs reviewed by me He is not on telemetry Otherwise, I agree with data as outlined by the advanced practice provider.  Exam performed by me: Gen: NAD Neck: No JVD Cardiac: RRR with systolic murmur Lungs: clear bilaterally Extremities: no LE edema  My Assessment and Plan:  CAD s/p CABG without angina: Would resume ASA, Ranexa , Norvasc , Zetia  and Diovan .  Ischemic cardiomyopathy/HFrEF: Volume status is ok. He appears to be well compensated.  Paroxysmal atrial fib: Regular on exam. Not on telemetry. Will get EKG today. He is not on long term anti-coagulation due to GI bleeding.   He is well compensated from a cardiac standpoint. If he is going to have a prolonged hospital stay, would recommend consulting IM and transferring to their inpatient service. We will be available as needed for acute cardiac issues.  As above, would resume his home medications.   Lonni Cash, MD, [facility] 08/01/2024 3:50 PM    Signed,   Lonni Cash, MD  08/01/2024 3:35 PM

## 2024-08-01 NOTE — Evaluation (Signed)
 Physical Therapy Evaluation Patient Details Name: [name] MRN: [id number] DOB: [birth year] Today's Date: 08/01/2024  History of Present Illness  88 y.o. male presents to [facility] hospital on 07/31/2024 after a recent fall with persistent back pain. CT scan shows a small chip fx at the L2 superior endplate, also paraspinal hematoma. PMH includes asthma, CAD, CHF, DM, diverticulosis, HLD, GERD, HTN,  Clinical Impression  Pt presents to PT with deficits in gait, balance, strength, power, endurance. Pt reports all of his recent falls have occurred when he is not utilizing his RW, despite family encouragement for him to do so. Pt is able to ambulate for short household distances with use of RW at this time. Pt does report 10/10 pain but declines the need for pain medication. Pt independently verbalizes the need to utilize his RW at all times when mobilizing at home. Pt reports having 2 walkers available at home and should have one present on each level of the home. PT anticipates pt mobility will continue to improve with further opportunity to mobilize during admission. PT recommends discharge home with HHPT.        If plan is discharge home, recommend the following: A little help with walking and/or transfers;A little help with bathing/dressing/bathroom;Assistance with cooking/housework;Assist for transportation;Help with stairs or ramp for entrance   Can travel by private vehicle        Equipment Recommendations None recommended by PT  Recommendations for Other Services       Functional Status Assessment Patient has had a recent decline in their functional status and demonstrates the ability to make significant improvements in function in a reasonable and predictable amount of time.     Precautions / Restrictions Precautions Precautions: Fall;Back Precaution Booklet Issued: No Required Braces or Orthoses:  (family bringing in brace tonight, brace is for comfort) Restrictions Weight  Bearing Restrictions Per Provider Order: No      Mobility  Bed Mobility Overal bed mobility: Needs Assistance Bed Mobility: Supine to Sit     Supine to sit: Supervision, HOB elevated     General bed mobility comments: increased time    Transfers Overall transfer level: Needs assistance Equipment used: Rolling walker (2 wheels) Transfers: Sit to/from Stand Sit to Stand: Contact guard assist           General transfer comment: verbal cues to increased anterior weight shift    Ambulation/Gait Ambulation/Gait assistance: Contact guard assist Gait Distance (Feet): 30 Feet Assistive device: Rolling walker (2 wheels) Gait Pattern/deviations: Step-through pattern Gait velocity: reduced Gait velocity interpretation: <1.8 ft/sec, indicate of risk for recurrent falls   General Gait Details: slowed step-through gait, increased trunk flexion over RW frame  Stairs            Wheelchair Mobility     Tilt Bed    Modified Rankin (Stroke Patients Only)       Balance Overall balance assessment: Needs assistance Sitting-balance support: No upper extremity supported, Feet supported Sitting balance-Leahy Scale: Fair     Standing balance support: Single extremity supported, Reliant on assistive device for balance Standing balance-Leahy Scale: Poor                               Pertinent Vitals/Pain Pain Assessment Pain Assessment: 0-10 Pain Score: 10-Worst pain ever Pain Location: back Pain Descriptors / Indicators: Aching Pain Intervention(s): Limited activity within patient's tolerance (pt declines the need for pain medication)    Home  Living Family/patient expects to be discharged to:: Private residence Living Arrangements: Spouse/significant other Available Help at Discharge: Family;Available 24 hours/day Type of Home: House Home Access: Stairs to enter Entrance Stairs-Rails: Right Entrance Stairs-Number of Steps: 2 Alternate Level  Stairs-Number of Steps: flight Home Layout: Multi-level;Able to live on main level with bedroom/bathroom Home Equipment: Rolling Walker (2 wheels);Cane - single point;BSC/3in1;Adaptive equipment      Prior Function Prior Level of Function : History of Falls (last six months);Independent/Modified Independent             Mobility Comments: ambulatory with RW typically, pt reports all of his recent falls have occurred when he is not holding onto his walker       Extremity/Trunk Assessment   Upper Extremity Assessment Upper Extremity Assessment: Overall WFL for tasks assessed    Lower Extremity Assessment Lower Extremity Assessment: Generalized weakness    Cervical / Trunk Assessment Cervical / Trunk Assessment: Kyphotic  Communication   Communication Communication: No apparent difficulties    Cognition Arousal: Alert Behavior During Therapy: WFL for tasks assessed/performed   PT - Cognitive impairments: No apparent impairments                         Following commands: Intact       Cueing Cueing Techniques: Verbal cues     General Comments General comments (skin integrity, edema, etc.): VSS on RA    Exercises     Assessment/Plan    PT Assessment Patient needs continued PT services  PT Problem List Decreased strength;Decreased activity tolerance;Decreased balance;Decreased mobility;Decreased knowledge of use of DME;Decreased safety awareness;Pain       PT Treatment Interventions DME instruction;Gait training;Stair training;Therapeutic activities;Functional mobility training;Therapeutic exercise;Balance training;Neuromuscular re-education;Patient/family education    PT Goals (Current goals can be found in the Care Plan section)  Acute Rehab PT Goals Patient Stated Goal: to stop falling PT Goal Formulation: With patient Time For Goal Achievement: 08/15/24 Potential to Achieve Goals: Fair    Frequency Min 2X/week     Co-evaluation                AM-PAC PT 6 Clicks Mobility  Outcome Measure Help needed turning from your back to your side while in a flat bed without using bedrails?: A Little Help needed moving from lying on your back to sitting on the side of a flat bed without using bedrails?: A Little Help needed moving to and from a bed to a chair (including a wheelchair)?: A Little Help needed standing up from a chair using your arms (e.g., wheelchair or bedside chair)?: A Little Help needed to walk in hospital room?: A Little Help needed climbing 3-5 steps with a railing? : A Lot 6 Click Score: 17    End of Session Equipment Utilized During Treatment: Gait belt Activity Tolerance: Patient tolerated treatment well Patient left: in chair;with call bell/phone within reach;with chair alarm set Nurse Communication: Mobility status PT Visit Diagnosis: Other abnormalities of gait and mobility (R26.89);Muscle weakness (generalized) (M62.81);Pain Pain - part of body:  (back)    Time: 8459-8397 PT Time Calculation (min) (ACUTE ONLY): 22 min   Charges:   PT Evaluation $PT Eval Low Complexity: 1 Low   PT General Charges $$ ACUTE PT VISIT: 1 Visit         Bernardino JINNY Ruth, PT, DPT Acute Rehabilitation Office [phone]   Bernardino JINNY Ruth 08/01/2024, 4:21 PM

## 2024-08-01 NOTE — Progress Notes (Signed)
 Progress Note     Subjective: Patient reports pain is present across his back bilaterally. Feels that pain is only present with movement and is manageable at this time. Denies worsening pain. Urinating well. Has not had BM since being hospitalized. Reports flatulence. Tolerating CLD. Denies nausea and vomiting, CP, SOB, dizziness, and vision changes.  ROS  All negative with the exception of above.  Objective: Vital signs in last 24 hours: Temp:  [97.5 F (36.4 C)-98.2 F (36.8 C)] 97.5 F (36.4 C) (10/29 0604) Pulse Rate:  [43-80] 65 (10/29 0604) Resp:  [12-18] 14 (10/29 0604) BP: (120-145)/(66-88) 137/81 (10/29 0604) SpO2:  [93 %-100 %] 99 % (10/29 0604)    Intake/Output from previous day: 10/28 0701 - 10/29 0700 In: 1000 [IV Piggyback:1000] Out: -  Intake/Output this shift: No intake/output data recorded.  PE: General: Pleasant male who is laying in bed in NAD HEENT: Head is normocephalic. Right periorbital area with ecchymosis. atraumatic.  Sclera are noninjected.  EOMI. Ears and nose without any masses or lesions.  Mouth is pink and moist Heart: HR normal during encounter. Palpable radial and pedal pulses bilaterally Lungs: CTAB, no wheezes, rhonchi, or rales noted. Respiratory effort nonlabored. Abd: Soft, NT, ND. MS: All 4 extremities are symmetrical with no cyanosis, clubbing, or edema. Tenderness to palpation along L spine. Skin: Warm and dry. Abrasions noted of bilateral upper extremities. Left knee with abrasion.  Neuro: Speech is normal. Responds to questioning appropriately. Psych: A&Ox3 with an appropriate affect.    Lab Results:  Recent Labs    07/31/24 1558 08/01/24 0439  WBC 7.3 6.8  HGB 10.0* 9.5*  HCT 30.3* 28.4*  PLT 202 204   BMET Recent Labs    07/31/24 1558 08/01/24 0439  NA 137 138  K 4.3 4.7  CL 102 105  CO2 23 24  GLUCOSE 118* 103*  BUN 29* 24*  CREATININE 1.73* 1.61*  CALCIUM  9.7 9.3   PT/INR No results for input(s):  LABPROT, INR in the last 72 hours. CMP     Component Value Date/Time   NA 138 08/01/2024 0439   NA 141 02/29/2024 1517   K 4.7 08/01/2024 0439   CL 105 08/01/2024 0439   CO2 24 08/01/2024 0439   GLUCOSE 103 (H) 08/01/2024 0439   BUN 24 (H) 08/01/2024 0439   BUN 21 02/29/2024 1517   CREATININE 1.61 (H) 08/01/2024 0439   CREATININE 1.65 (H) 03/04/2016 0942   CALCIUM  9.3 08/01/2024 0439   PROT 6.5 07/31/2024 1558   PROT 6.3 02/29/2024 1517   ALBUMIN 3.7 07/31/2024 1558   ALBUMIN 3.9 02/29/2024 1517   AST 23 07/31/2024 1558   ALT 13 07/31/2024 1558   ALKPHOS 39 07/31/2024 1558   BILITOT 1.2 07/31/2024 1558   BILITOT 0.7 02/29/2024 1517   GFRNONAA 41 (L) 08/01/2024 0439   GFRAA 48 (L) 10/22/2020 1100   Lipase     Component Value Date/Time   LIPASE 31.0 05/21/2015 1018       Studies/Results: CT Angio Chest/Abd/Pel for Dissection W and/or Wo Contrast Result Date: 07/31/2024 CLINICAL DATA:  Acute aortic syndrome suspected.  Fall. EXAM: CT ANGIOGRAPHY CHEST, ABDOMEN AND PELVIS TECHNIQUE: Non-contrast CT of the chest was initially obtained. Multidetector CT imaging through the chest, abdomen and pelvis was performed using the standard protocol during bolus administration of intravenous contrast. Multiplanar reconstructed images and MIPs were obtained and reviewed to evaluate the vascular anatomy. RADIATION DOSE REDUCTION: This exam was performed according to the departmental  dose-optimization program which includes automated exposure control, adjustment of the mA and/or kV according to patient size and/or use of iterative reconstruction technique. CONTRAST:  80mL OMNIPAQUE  IOHEXOL  350 MG/ML SOLN COMPARISON:  CT abdomen pelvis dated 07/01/2023. FINDINGS: CTA CHEST FINDINGS Cardiovascular: Mild cardiomegaly. No pericardial effusion. There is advanced 3 vessel coronary vascular calcification and calcification of the mitral annulus. Moderate atherosclerotic calcification of the thoracic  aorta. No abdominal dilatation or dissection. The origins of the great vessels of the aortic arch and the central pulmonary arteries appear patent. Mediastinum/Nodes: No hilar or mediastinal adenopathy. Moderate size hiatal hernia. The esophagus is grossly unremarkable. No mediastinal fluid collection. Lungs/Pleura: Trace right pleural effusion. There is minimal subpleural atelectasis of the right lower lobe. There are bibasilar subpleural linear atelectasis/scarring. No consolidative changes or pneumothorax. The central airways are patent. Musculoskeletal: Osteopenia with degenerative changes of the spine. Median sternotomy wires. No acute osseous pathology. Review of the MIP images confirms the above findings. CTA ABDOMEN AND PELVIS FINDINGS VASCULAR Aorta: Advanced atherosclerotic calcification of the abdominal aorta. There is a 7.7 cm infrarenal abdominal aortic aneurysm status post endovascular stent graft repair. The excluded aneurysm sac has increased in size since the prior CT (previously measuring approximately 7 cm in diameter. Evaluation is limited as the precontrast and delayed images of the abdomen are not performed. Faint high attenuating focus within the sac at 8 o'clock position (228/11) may be chronic. An endoleak is not excluded. Celiac: Atherosclerotic calcification of the origin of the celiac trunk. The celiac artery and its major branches are patent. SMA: Atherosclerotic calcification of the origin of the SMA. The SMA is patent. Renals: Atherosclerotic calcification of the origins of the renal arteries. The renal arteries are patent. IMA: The origin IMA is not visualized, likely occluded. Inflow: The iliac arteries are patent. No aneurysmal dilatation or dissection. Veins: No obvious venous abnormality within the limitations of this arterial phase study. Review of the MIP images confirms the above findings. NON-VASCULAR No intra-abdominal free air or free fluid. Hepatobiliary: The liver is  unremarkable. Mild biliary dilatation, post cholecystectomy. Pancreas: Unremarkable. No pancreatic ductal dilatation or surrounding inflammatory changes. Spleen: Normal in size without focal abnormality. Adrenals/Urinary Tract: The right adrenal glands unremarkable. There is a 2.6 cm left adrenal myelolipoma. Mild right renal parenchyma atrophy. Bilateral renal inferior pole scarring. There is no hydronephrosis on either side. The urinary bladder is grossly unremarkable. Stomach/Bowel: There is sigmoid diverticulosis. There is no bowel obstruction or active inflammation. The appendix is normal. Lymphatic: No adenopathy. Reproductive: The prostate is grossly remarkable Other: Right paraspinal hematoma extends from L2-L5 along the right psoas muscle and measures approximately 3.5 x 6 cm in greatest axial dimensions. Musculoskeletal: Osteopenia with degenerative changes of the spine. Minimally displaced triangular fracture of the anterior superior L2 with extension of the fracture into the L2 superior endplate. L2-L3 disc spacer and posterior fusion. Review of the MIP images confirms the above findings. IMPRESSION: 1. No acute intrathoracic pathology. 2. A 7.7 cm infrarenal abdominal aortic aneurysm status post prior endovascular stent graft repair. The excluded aneurysm sac has increased in size since the prior CT. An endoleak is not excluded. Vascular surgery consult is advised. 3. Minimally displaced triangular fracture of the anterior superior L2 with extension of the fracture into the L2 superior endplate. 4. Right paraspinal hematoma extends from L2-L5 along the right psoas muscle and measures approximately 3.5 x 6 cm in greatest axial dimensions. 5. Sigmoid diverticulosis. No bowel obstruction. Normal appendix. 6.  Aortic  Atherosclerosis (ICD10-I70.0). Electronically Signed   By: Vanetta Chou M.D.   On: 07/31/2024 18:49   CT L-SPINE NO CHARGE Result Date: 07/31/2024 CLINICAL DATA:  Fall.  Trauma to the  back.  Back pain. EXAM: CT THORACIC AND LUMBAR SPINE WITHOUT CONTRAST TECHNIQUE: Multidetector CT imaging of the thoracic and lumbar spine was performed without contrast. Multiplanar CT image reconstructions were also generated. RADIATION DOSE REDUCTION: This exam was performed according to the departmental dose-optimization program which includes automated exposure control, adjustment of the mA and/or kV according to patient size and/or use of iterative reconstruction technique. COMPARISON:  CT abdomen pelvis dated 07/01/2023. FINDINGS: CT THORACIC SPINE FINDINGS Alignment: No acute subluxation. Vertebrae: No acute fracture.  Osteopenia. Paraspinal and other soft tissues: No paraspinal fluid collection hematoma. Disc levels: No acute findings.  Degenerative changes. CT LUMBAR SPINE FINDINGS Segmentation: 5 lumbar type vertebrae. Alignment: No acute subluxation. Vertebrae: Minimally displaced fracture of the anterior corner of the superior endplate of L2. There is extension of the fracture to the superior endplate. No other acute fracture. There is advanced osteopenia which limits evaluation for fracture. Paraspinal and other soft tissues: Right paraspinal hematoma extends from L2 down to L5 posterior to the right psoas muscle. Disc levels: L2-L3 and L3-L4 disc spacers. There is L2-L3 posterior fusion. Multilevel degenerative changes and facet arthropathy. IMPRESSION: 1. Minimally displaced fracture of the anterior corner of the superior endplate of L2. 2. Right paraspinal hematoma extends from L2 down to L5 posterior to the right psoas muscle. 3. No acute/traumatic thoracic spine pathology. Electronically Signed   By: Vanetta Chou M.D.   On: 07/31/2024 18:35   CT T-SPINE NO CHARGE Result Date: 07/31/2024 CLINICAL DATA:  Fall.  Trauma to the back.  Back pain. EXAM: CT THORACIC AND LUMBAR SPINE WITHOUT CONTRAST TECHNIQUE: Multidetector CT imaging of the thoracic and lumbar spine was performed without  contrast. Multiplanar CT image reconstructions were also generated. RADIATION DOSE REDUCTION: This exam was performed according to the departmental dose-optimization program which includes automated exposure control, adjustment of the mA and/or kV according to patient size and/or use of iterative reconstruction technique. COMPARISON:  CT abdomen pelvis dated 07/01/2023. FINDINGS: CT THORACIC SPINE FINDINGS Alignment: No acute subluxation. Vertebrae: No acute fracture.  Osteopenia. Paraspinal and other soft tissues: No paraspinal fluid collection hematoma. Disc levels: No acute findings.  Degenerative changes. CT LUMBAR SPINE FINDINGS Segmentation: 5 lumbar type vertebrae. Alignment: No acute subluxation. Vertebrae: Minimally displaced fracture of the anterior corner of the superior endplate of L2. There is extension of the fracture to the superior endplate. No other acute fracture. There is advanced osteopenia which limits evaluation for fracture. Paraspinal and other soft tissues: Right paraspinal hematoma extends from L2 down to L5 posterior to the right psoas muscle. Disc levels: L2-L3 and L3-L4 disc spacers. There is L2-L3 posterior fusion. Multilevel degenerative changes and facet arthropathy. IMPRESSION: 1. Minimally displaced fracture of the anterior corner of the superior endplate of L2. 2. Right paraspinal hematoma extends from L2 down to L5 posterior to the right psoas muscle. 3. No acute/traumatic thoracic spine pathology. Electronically Signed   By: Vanetta Chou M.D.   On: 07/31/2024 18:35   CT Maxillofacial Wo Contrast Result Date: 07/31/2024 EXAM: CT OF THE FACE WITHOUT CONTRAST 07/31/2024 05:46:11 PM TECHNIQUE: CT of the face was performed without the administration of intravenous contrast. Multiplanar reformatted images are provided for review. Automated exposure control, iterative reconstruction, and/or weight based adjustment of the mA/kV was utilized to reduce the  radiation dose to as low  as reasonably achievable. COMPARISON: None available. CLINICAL HISTORY: Facial trauma, blunt. 88 yo male tripped while walking through doorway on Thursday. States hit head on rocking chair, light fixture fell on back. Reports headache. Reports mid back pain and generalized abd pain. FINDINGS: FACIAL BONES: Mandibular heads are normally positioned. No mandibular fracture. Pterygoid plates and zygomatic arches appear intact. No acute findings nasal bone fracture. No suspicious bone lesion. ORBITS: Globes are intact. No acute traumatic injury. No inflammatory change. SINUSES AND MASTOIDS: Mastoid air cells are clear. Mild mucosal thickening of the sinuses without fluid level or sinus wall fracture. SOFT TISSUES: No acute abnormality. IMPRESSION: 1. No acute facial fracture. Electronically signed by: Luke Bun MD 07/31/2024 06:15 PM EDT RP Workstation: [id number]   DG Knee Complete 4 Views Right Result Date: 07/31/2024 EXAM: 4 OR MORE VIEW(S) XRAY OF THE RIGHT KNEE 07/31/2024 05:48:00 PM COMPARISON: None available. CLINICAL HISTORY: Trauma. Tripped while walking through doorway on Thursday. Bilateral knees and right elbow pain. FINDINGS: BONES AND JOINTS: Right knee replacement with normal alignment. No acute fracture. No focal osseous lesion. No joint dislocation. Trace knee effusion. SOFT TISSUES: Vascular calcifications. IMPRESSION: 1. No acute fracture. 2. Right knee arthroplasty with normal alignment. 3. Trace knee effusion. Electronically signed by: Luke Bun MD 07/31/2024 06:06 PM EDT RP Workstation: [id number]   DG Knee Complete 4 Views Left Result Date: 07/31/2024 EXAM: 4 OR MORE VIEW(S) XRAY OF THE LEFT KNEE 07/31/2024 05:48:00 PM COMPARISON: None available. CLINICAL HISTORY: trauma. Tripped while walking through doorway on thursday. Bilateral knees and right elbow pain. FINDINGS: BONES AND JOINTS: Knee replacement with normal alignment. No definitive acute displaced fracture. No focal osseous  lesion. Trace knee effusion. SOFT TISSUES: Vascular calcifications. The soft tissues are unremarkable. IMPRESSION: 1. No acute displaced fracture. 2. Trace knee effusion. 3. Status post knee arthroplasty with normal alignment. Electronically signed by: Luke Bun MD 07/31/2024 06:05 PM EDT RP Workstation: [id number]   DG Elbow Complete Right Result Date: 07/31/2024 EXAM: 3 VIEW(S) XRAY OF THE RIGHT ELBOW COMPARISON: None available. CLINICAL HISTORY: trauma. Tripped while walking through doorway on thursday. Bilateral knees and right elbow pain. FINDINGS: BONES AND JOINTS: No definitive fracture or malalignment. No joint dislocation. No significant effusion. SOFT TISSUES: Soft tissue calcifications at the posterior elbow. Vascular calcifications. IMPRESSION: 1. No acute fracture or malalignment. 2. No significant elbow joint effusion. Electronically signed by: Luke Bun MD 07/31/2024 06:03 PM EDT RP Workstation: [id number]   CT Head Wo Contrast Result Date: 07/31/2024 EXAM: CT HEAD WITHOUT CONTRAST 07/31/2024 05:46:11 PM TECHNIQUE: CT of the head was performed without the administration of intravenous contrast. Automated exposure control, iterative reconstruction, and/or weight based adjustment of the mA/kV was utilized to reduce the radiation dose to as low as reasonably achievable. COMPARISON: None available. CLINICAL HISTORY: Head trauma, intracranial venous injury suspected. 88 yo male tripped while walking through doorway on Thursday. States hit head on rocking chair, light fixture fell on back. Reports headache. Reports mid back pain and generalized abd pain. FINDINGS: BRAIN AND VENTRICLES: No acute territorial infarction, hemorrhage, or intracranial mass. Atrophy and mild chronic small vessel ischemic changes of the white matter. Nonenlarged ventricles. No extra-axial collection. Multiple small chronic cerebellar infarcts. Small chronic left frontal lobe infarct near the vertex. Small chronic  infarct within the medial left temporal lobe, sagittal series 10 image 48. Chronic lacunar infarcts within the thalamus. Vertebral and carotid vascular calcification. ORBITS: No acute abnormality. SINUSES: No acute abnormality. SOFT TISSUES  AND SKULL: No acute soft tissue abnormality. No skull fracture. IMPRESSION: 1. No acute intracranial abnormality. 2. Atrophy with mild chronic small vessel ischemic disease. 3. Multifocal chronic-appearing infarcts as described above. Electronically signed by: Luke Bun MD 07/31/2024 06:02 PM EDT RP Workstation: [id number]   CT Cervical Spine Wo Contrast Result Date: 07/31/2024 EXAM: CT CERVICAL SPINE WITHOUT CONTRAST 07/31/2024 05:46:11 PM TECHNIQUE: CT of the cervical spine was performed without the administration of intravenous contrast. Multiplanar reformatted images are provided for review. Automated exposure control, iterative reconstruction, and/or weight based adjustment of the mA/kV was utilized to reduce the radiation dose to as low as reasonably achievable. COMPARISON: CT cervical spine 02/08/2011. CLINICAL HISTORY: Neck trauma (Age >= 65y). 88 yo male tripped while walking through doorway on Thursday. States hit head on rocking chair, light fixture fell on back. Reports headache. Reports mid back pain and generalized abd pain. FINDINGS: CERVICAL SPINE: BONES AND ALIGNMENT: Small focal lucency involving the left lateral mass of C2, present on the 2012 study, possibly a nutrient vessel foramen. Anterior cervical fusion hardware at C4-C5 and C6-C7. Posterior instrumented fusion from C6 to T1. Solid osseous fusion from C4 to C7 levels. No solid osseous fusion demonstrated across the C7-T1 level. Hardware is intact. No compression fracture or displaced fracture demonstrated within the cervical spine. Sequelae of laminectomy at C7-T1. DEGENERATIVE CHANGES: Atherosclerosis at the carotid bifurcations. Degenerative endplate osteophytes at multiple levels. Disc bulge at  C2-C3 resulting in at least mild spinal canal stenosis. Disc osteophyte complex at C3-C4 results in mild to moderate spinal canal stenosis. Facet arthrosis and uncovertebral hypertrophy at multiple levels resulting in significant foraminal stenosis, most pronounced at C3-C4. SOFT TISSUES: No prevertebral soft tissue swelling. IMPRESSION: 1. No acute abnormality of the cervical spine related to reported neck trauma. 2. Degenerative changes as above. 3. Anterior cervical fusion hardware at C4-5 and C6-7, with additional posterior instrumented fusion from C6 to T1. Solid osseous fusion from C4 to C7 levels. No solid osseous fusion demonstrated across the C7-T1 level. Hardware is intact. Electronically signed by: Donnice Mania MD 07/31/2024 05:59 PM EDT RP Workstation: [id number]    Anti-infectives: Anti-infectives (From admission, onward)    None        Assessment/Plan 88 y/o M who experienced a mechanical fall and then had a lamp fall onto his back   L2 endplate fx with associated paraspinal hematoma  - Dr. Darnella from neurosurgery will be consulting  AAA s/p repair and embolization for endoleak  - Dr. Lucas called by EDP and reported this looks stable. - Consulted cardiology    FEN - CLD VTE - Holding ID - None    LOS: 0 days   I reviewed ED provider notes, consulting provider notes, specialist notes, nursing notes, last 24 h vitals and pain scores, last 48 h intake and output, last 24 h labs and trends, and last 24 h imaging results.  This care required moderate level of medical decision making.    Marjorie Carlyon Favre, [facility] Surgery 08/01/2024, 7:48 AM Please see Amion for pager number during day hours 7:00am-4:30pm

## 2024-08-02 DIAGNOSIS — T1490XA Injury, unspecified, initial encounter: Secondary | ICD-10-CM

## 2024-08-02 DIAGNOSIS — E11A Type 2 diabetes mellitus without complications in remission: Secondary | ICD-10-CM

## 2024-08-02 DIAGNOSIS — D649 Anemia, unspecified: Secondary | ICD-10-CM | POA: Diagnosis present

## 2024-08-02 DIAGNOSIS — N400 Enlarged prostate without lower urinary tract symptoms: Secondary | ICD-10-CM | POA: Diagnosis present

## 2024-08-02 DIAGNOSIS — W19XXXA Unspecified fall, initial encounter: Secondary | ICD-10-CM

## 2024-08-02 DIAGNOSIS — Y92009 Unspecified place in unspecified non-institutional (private) residence as the place of occurrence of the external cause: Secondary | ICD-10-CM

## 2024-08-02 DIAGNOSIS — N401 Enlarged prostate with lower urinary tract symptoms: Secondary | ICD-10-CM | POA: Diagnosis present

## 2024-08-02 DIAGNOSIS — S32029A Unspecified fracture of second lumbar vertebra, initial encounter for closed fracture: Secondary | ICD-10-CM | POA: Diagnosis present

## 2024-08-02 DIAGNOSIS — I10 Essential (primary) hypertension: Secondary | ICD-10-CM | POA: Diagnosis present

## 2024-08-02 DIAGNOSIS — T148XXA Other injury of unspecified body region, initial encounter: Secondary | ICD-10-CM | POA: Diagnosis present

## 2024-08-02 DIAGNOSIS — I25119 Atherosclerotic heart disease of native coronary artery with unspecified angina pectoris: Secondary | ICD-10-CM

## 2024-08-02 DIAGNOSIS — E785 Hyperlipidemia, unspecified: Secondary | ICD-10-CM

## 2024-08-02 DIAGNOSIS — I480 Paroxysmal atrial fibrillation: Secondary | ICD-10-CM | POA: Diagnosis present

## 2024-08-02 DIAGNOSIS — I5020 Unspecified systolic (congestive) heart failure: Secondary | ICD-10-CM | POA: Diagnosis present

## 2024-08-02 DIAGNOSIS — I7143 Infrarenal abdominal aortic aneurysm, without rupture: Secondary | ICD-10-CM

## 2024-08-02 DIAGNOSIS — I714 Abdominal aortic aneurysm, without rupture, unspecified: Secondary | ICD-10-CM | POA: Diagnosis present

## 2024-08-02 DIAGNOSIS — N1832 Chronic kidney disease, stage 3b: Secondary | ICD-10-CM

## 2024-08-02 DIAGNOSIS — K219 Gastro-esophageal reflux disease without esophagitis: Secondary | ICD-10-CM

## 2024-08-02 LAB — BASIC METABOLIC PANEL WITH GFR
Anion gap: 12 (ref 5–15)
BUN: 22 mg/dL (ref 8–23)
CO2: 24 mmol/L (ref 22–32)
Calcium: 9.1 mg/dL (ref 8.9–10.3)
Chloride: 101 mmol/L (ref 98–111)
Creatinine, Ser: 1.55 mg/dL — ABNORMAL HIGH (ref 0.61–1.24)
GFR, Estimated: 43 mL/min — ABNORMAL LOW (ref >60)
Glucose, Bld: 109 mg/dL — ABNORMAL HIGH (ref 70–99)
Potassium: 3.9 mmol/L (ref 3.5–5.1)
Sodium: 137 mmol/L (ref 135–145)

## 2024-08-02 LAB — CBC
HCT: 27.5 % — ABNORMAL LOW (ref 39.0–52.0)
Hemoglobin: 9.3 g/dL — ABNORMAL LOW (ref 13.0–17.0)
MCH: 32 pg (ref 26.0–34.0)
MCHC: 33.8 g/dL (ref 30.0–36.0)
MCV: 94.5 fL (ref 80.0–100.0)
Platelets: 200 K/uL (ref 150–400)
RBC: 2.91 MIL/uL — ABNORMAL LOW (ref 4.22–5.81)
RDW: 14.8 % (ref 11.5–15.5)
WBC: 6.8 K/uL (ref 4.0–10.5)
nRBC: 0 % (ref 0.0–0.2)

## 2024-08-02 LAB — GLUCOSE, CAPILLARY
Glucose-Capillary: 97 mg/dL (ref 70–99)
Glucose-Capillary: 108 mg/dL — ABNORMAL HIGH (ref 70–99)
Glucose-Capillary: 149 mg/dL — ABNORMAL HIGH (ref 70–99)
Glucose-Capillary: 83 mg/dL (ref 70–99)

## 2024-08-02 MED ORDER — IRBESARTAN 300 MG PO TABS
300.0000 mg | ORAL_TABLET | Freq: Every day | ORAL | Status: DC
  Administered 2024-08-02 – 2024-08-05 (×4): 300 mg via ORAL
  Filled 2024-08-02 (×4): qty 1

## 2024-08-02 MED ORDER — HYDROCODONE-ACETAMINOPHEN 5-325 MG PO TABS: 1.0000 | ORAL_TABLET | Freq: Four times a day (QID) | ORAL | Status: DC | PRN

## 2024-08-02 MED ORDER — FENOFIBRATE 160 MG PO TABS
160.0000 mg | ORAL_TABLET | Freq: Every day | ORAL | Status: DC
  Administered 2024-08-03 – 2024-08-05 (×3): 160 mg via ORAL
  Filled 2024-08-02 (×3): qty 1

## 2024-08-02 MED ORDER — RANOLAZINE ER 500 MG PO TB12
500.0000 mg | ORAL_TABLET | Freq: Every day | ORAL | Status: DC
  Administered 2024-08-02 – 2024-08-05 (×4): 500 mg via ORAL
  Filled 2024-08-02 (×4): qty 1

## 2024-08-02 MED ORDER — FENOFIBRIC ACID 135 MG PO CPDR: DELAYED_RELEASE_CAPSULE | Freq: Every day | ORAL | Status: DC

## 2024-08-02 MED ORDER — VALSARTAN-HYDROCHLOROTHIAZIDE 320-12.5 MG PO TABS: 1.0000 | ORAL_TABLET | Freq: Every day | ORAL | Status: DC

## 2024-08-02 MED ORDER — HYDROCHLOROTHIAZIDE 12.5 MG PO TABS
12.5000 mg | ORAL_TABLET | Freq: Every day | ORAL | Status: DC
  Administered 2024-08-02 – 2024-08-03 (×2): 12.5 mg via ORAL
  Filled 2024-08-02 (×2): qty 1

## 2024-08-02 MED ORDER — ACETAMINOPHEN 325 MG PO TABS: 650.0000 mg | ORAL_TABLET | Freq: Four times a day (QID) | ORAL | Status: DC | PRN

## 2024-08-02 MED ORDER — EZETIMIBE 10 MG PO TABS
10.0000 mg | ORAL_TABLET | Freq: Every day | ORAL | Status: DC
Start: 2024-08-02 — End: 2024-08-05
  Administered 2024-08-02 – 2024-08-05 (×4): 10 mg via ORAL
  Filled 2024-08-02 (×4): qty 1

## 2024-08-02 MED ORDER — AMLODIPINE BESYLATE 5 MG PO TABS
5.0000 mg | ORAL_TABLET | Freq: Every day | ORAL | Status: DC
Start: 2024-08-02 — End: 2024-08-05
  Administered 2024-08-02 – 2024-08-05 (×4): 5 mg via ORAL
  Filled 2024-08-02 (×4): qty 1

## 2024-08-02 NOTE — TOC Initial Note (Signed)
 Transition of Care [facility]) - Initial/Assessment Note    Patient Details  Name: [name] MRN: [id number] Date of Birth: Dec 30, 1935  Transition of [facility]) CM/SW Contact:    Julian Finnicum M, RN Phone Number: 08/02/2024, 3:45pm  Clinical Narrative:                 88 y.o. male presents to [facility] hospital on 07/31/2024 after a recent fall with persistent back pain. CT scan shows a small chip fx at the L2 superior endplate, also paraspinal hematoma.  PTA, pt requires assistance with ADLs and driving; he has a hx of falls in the last 6 months.  He is ambulatory with a rolling walker.  PT/OT recommending HH follow up, and patient is agreeable to services.  He states he has had home health services in the past, but can't remember agency.  He asks if I will call his wife [name], as he thinks she will know.  Called patient's wife, and left message on voicemail.  Will follow up tomorrow.    Expected Discharge Plan: Home w Home Health Services Barriers to Discharge: Continued Medical Work up   Patient Goals and CMS Choice            Expected Discharge Plan and Services   Discharge Planning Services: CM Consult Post Acute Care Choice: Home Health Living arrangements for the past 2 months: Single Family Home                                      Prior Living Arrangements/Services Living arrangements for the past 2 months: Single Family Home Lives with:: Spouse Patient language and need for interpreter reviewed:: Yes Do you feel safe going back to the place where you live?: Yes      Need for Family Participation in Patient Care: Yes (Comment) Care giver support system in place?: Yes (comment) Current home services: DME Criminal Activity/Legal Involvement Pertinent to Current Situation/Hospitalization: No - Comment as needed  Activities of Daily Living   ADL Screening (condition at time of admission) Independently performs ADLs?: Yes (appropriate for developmental age) Is  the patient deaf or have difficulty hearing?: No Does the patient have difficulty seeing, even when wearing glasses/contacts?: No Does the patient have difficulty concentrating, remembering, or making decisions?: No  Permission Sought/Granted   Permission granted to share information with : Yes, Verbal Permission Granted  Share Information with NAME: [name]     Permission granted to share info w Relationship: spouse  Permission granted to share info w Contact Information: [phone]  Emotional Assessment Appearance:: Appears stated age Attitude/Demeanor/Rapport: Engaged Affect (typically observed): Accepting Orientation: : Oriented to Self, Oriented to Place, Oriented to  Time, Oriented to Situation      Admission diagnosis:  Trauma [T14.90XA] Closed fracture of second lumbar vertebra, unspecified fracture morphology, initial encounter (HCC) [S32.029A] 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] Infrarenal abdominal aortic aneurysm (AAA) without rupture [I71.43] Patient Active Problem List   Diagnosis Date Noted   Trauma 07/31/2024   Benign prostatic hyperplasia 02/28/2024   Permanent atrial fibrillation (HCC) 02/28/2024   S/P lumbar fusion 01/10/2023   AMD (age related macular degeneration) 12/14/2022   Median neuropathy, right 12/14/2022   Low back pain 11/02/2022   Aortic aneurysm without rupture 11/02/2022   Urge incontinence 08/10/2022   Fatigue 02/15/2022   Balance problems 02/15/2022   History of colonic polyps 10/27/2021  Atherosclerosis of aorta 10/22/2020   Allergic rhinitis 06/12/2020   Carpal tunnel syndrome, right upper limb 04/30/2019   Carpal tunnel syndrome, left upper limb 04/30/2019   Senile purpura 07/20/2017   S/P total knee replacement 12/20/2016   Status post endovascular aneurysm repair (EVAR) 03/16/2016   Mild aortic stenosis by prior echocardiogram 02/02/2016   Statin myopathy 09/30/2015   Calculus of bile duct with obstruction and  without cholangitis or cholecystitis    Presence of drug coated stent in right coronary artery and circumflex coronary artery 01/08/2015   Chronic renal insufficiency, stage III (moderate) 06/06/2014   Coronary stent restenosis with uncertain cause: Required PCI for ISR of OM1 (Promus P 2.75 mm x 12 mm) & pRCA 05/17/2014   Obesity (BMI 30-39.9) 06/28/2013   Encounter for long-term (current) use of medications 06/28/2013   ACE-inhibitor cough 04/16/2013   Chronic diastolic CHF (congestive heart failure) (HCC) 11/24/2012   Type 2 diabetes mellitus in remission 03/04/2011    Class: Diagnosis of   Hypertensive heart disease with chronic diastolic congestive heart failure (HCC) 03/04/2011   Hyperlipidemia LDL goal <70; statin intolerant 03/04/2011    Class: Diagnosis of   Erectile dysfunction 03/04/2011   Gastroesophageal reflux disease 03/04/2011   History of ST elevation myocardial infarction (STEMI) of inferolateral wall (PTCA - 100% large lateral OM) 06/28/1996    Class: History of   CAD S/P CABG '95- several PCis since, last 05/17/14 06/28/1994    Class: History of   PCP:  Joyce Norleen BROCKS, MD Pharmacy:   [facility] [id number]), [place] [address] [place] ([place]) [place] [address] Phone: [phone] Fax: [phone]  [facility] [id number] [place], [place] - [address] [place] [address] Phone: [phone] Fax: [phone]  [facility] [place], [place] - [address] [place] [address] Phone: [phone] Fax: [phone]  [facility] [address] [place] [address] Phone: [phone] Fax: [phone]     Social Drivers of Health (SDOH) Social History: SDOH Screenings   Food Insecurity: No Food Insecurity (08/01/2024)  Housing: Low Risk  (08/01/2024)  Transportation Needs: No Transportation Needs  (08/01/2024)  Utilities: Not At Risk (08/01/2024)  Depression (PHQ2-9): Low Risk  (02/29/2024)  Social Connections: Socially Integrated (08/01/2024)  Tobacco Use: Medium Risk (08/01/2024)   SDOH Interventions:     Readmission Risk Interventions     No data to display         Mliss MICAEL Fass, RN, BSN  Trauma/Neuro ICU Case Manager [phone]

## 2024-08-02 NOTE — Consult Note (Signed)
 Initial Consultation Note   Patient: Julian KEEVEN FMW:[id number] DOB: 05/08/1936 PCP: Joyce Norleen BROCKS, MD DOA: 07/31/2024 DOS: the patient was seen and examined on 08/02/2024 Primary service: Md, Trauma, MD  Referring physician: Edmundo RIGGERS Reason for consult: Medical management  Assessment/Plan: Assessment and Plan:  L2 endplate fracture with paraspinal hematoma secondary to fall Patient reports having frequent falls despite using walker.  After most recent fall found to have a minimally displaced triangular fracture of the anterior superior L2 with extension of the fracture into L2 superior endplate with 3.5 x 6 cm right paraspinal hematoma present.  Neurosurgery had been consulted and recommended bracing for comfort, pain control, and PT/OT to eval and treat. - Continue pain control and PT/OT evaluations -Changed tramadol  to hydrocodone  to see if better pain control - Transitions of care consulted for possible need of rehab - Recommend outpatient follow-up with neurosurgery for serial x-rays  AAA Patient's status post 2 separate interventions for endoleak repair which still persist.  Aneurysm noted to measure 7.7 cm.  Vascular surgery evaluated and recommended outpatient follow-up with radiology and no inpatient treatment necessary at this time. - Recommend outpatient follow-up with radiology as recommended by vascular surgery  Paroxysmal atrial fibrillation CHA2DS2-VASc score equal to at least 6.  Not on anticoagulation due to previous concerns with hematuria.  Cardiology abdominal pain and noted that he had previously been evaluated by Dr. Kennyth for consideration for Watchman procedure. - Consider resuming aspirin  tomorrow  Heart failure with mildly reduced ejection fraction Noted to be euvolemic on physical exam.  Last echocardiogram noted EF to be 45 to 50% with global hypokinesis and mildly reduced RV systolic function. - Strict I&O's and daily weights  Coronary artery  disease Hyperlipidemia Patient with prior CABG back in 1997. - Resume Ranexa , Zetia  as recommended by cardiology  Essential hypertension Pressures noted to be elevated up to 145/69. - Resume Norvasc  and Diovan  as recommended by cardiology  Normocytic anemia Hemoglobin noted to trend 10-> 9.5-> 9.3.  Patient appears to be hemodynamically stable at this time - Would recommend continued monitoring of H&H and pursuing further workup for possible bleeding if hemoglobin were to abruptly trend down  Diabetes mellitus type 2, without long-term use of insulin  Patient is not on any medications for treatment.  Last hemoglobin A1c noted to be 5.7 when checked 02/29/2024.  Patient was placed on a moderate sliding scale of insulin , but not requiring insulin  at this time.  - Advance diet to a carb modified diet  Chronic kidney disease stage IIIb Creatinine noted to be 1.55 which appears to be around patient's baseline.  BPH - Continue finasteride   Gerd - Continue Protonix   DVT prophylaxis: SCD added  TRH will continue to follow the patient.  HPI: [name] is a 88 y.o. male with past medical history of hypertension, CAD s/p CABG and PCI, CHF, paroxysmal atrial fibrillation, CKD stage IIIb, AAA s/p endovascular repair complicated by endovascular leak and subsequent embolization, and GERD presents after having a fall at home.  He experienced a fall in the basement after tripping over a light stand and a weed eater, resulting in him being trapped under a rocking chair. He was unable to get up on his own and required assistance from his wife, which took about an hour and a half.  He normally uses a walker for mobility but did not have it with him at the time of the fall. He has a history of multiple falls, including one last  Thursday and another about a week prior in the shower. He estimates falling about ten to fifteen times, with previous incidents resulting in injuries such as a busted mouth  and a large bruise from falling over a coffee table.  He has been experiencing balance issues since a previous back surgery, which has contributed to his frequent falls. He has pain that radiates from his feet through his back when he moves his toes.  He has been working with physical therapy, although he finds the sessions unhelpful in improving his mobility. He has not been able to walk independently and requires assistance to move even short distances.  Workup revealed 7.7 cm AAA with L2 endplate fracture with a paraspinal hematoma.  Evaluated by vascular surgery, neurosurgery, and cardiology for which no acute surgical intervention was recommended.  Patient's other medical issues were deemed to be stable for which he was recommended to resume home medications.  Review of Systems: As mentioned in the history of present illness. All other systems reviewed and are negative. Past Medical History:  Diagnosis Date   Adenomatous colon polyp    Ankle edema    Chronic   Asthma    CAD S/P percutaneous coronary angioplasty 2004   a) '04: Staged Taxus DES PCI to RCA (2 Taxus 2.75 x 32 & 12) and Cx-OM (Taxus 3 x 20);;'07 - PCI to pRCA ISR- Cypher DES; c) 05/2014: pPCI pRCA stentISR (Promus DES 3 x 8) OM1 distal to stent (Promus DES 2.75 x 12 - 3.1); 11/'22: distal LCx stent edge 99% -> Onyx Frontier DES 2.5 x 15 - 2.8 (overlaps prior stent)   CAD, multiple vessel 1985   Most recent July 2018: Admitted for non-STEMI. Occluded LAD with patent LIMA (SP1 now occluded).  Patent stents in proximal and mid RCA as well as circumflex-OM 3. Known occlusion of SVG-OM. EF 45-50%   CHF (congestive heart failure) (HCC)    Cholelithiasis - with cholangitis & choledocholithiasis    status post ERCP with removal of calculi and biliary stent placement.   Chronic anemia    On iron supplement; history of positive guaiac - negative colonoscopy in 1996.; Thought to be related to hemorrhoids; status post hemorrhoidectomy    Chronic back pain     multiple surgeries; C-spine and lumbar   Diabetes mellitus    no meds- pt states he is no longer DM   Diverticulitis of colon 1996   Diverticulosis    Dyslipidemia, goal LDL below 70    Erectile dysfunction    Exertional dyspnea    Chronic baseline SOB with ambulation   GERD (gastroesophageal reflux disease)    Grade II diastolic dysfunction    H/O: pneumonia 11/2012   Hemorrhoids    Hiatal hernia    History of: ST elevation myocardial infarction (STEMI) involving left circumflex coronary artery with complication 1985   PTCA-circumflex; PCI in 1991   Hypertension    Moderate aortic stenosis by prior echocardiogram 07/2015   Normal LV function - EF 55-60%.. Abnormal relaxation. Mild-moderate aortic stenosis (peak/mean gradient 18/10 mmH)   Osteoarthritis of both knees    And back; multiple back surgeries, right knee arthroplasty and left knee arthroscopic surgery x2   Pneumonia 2016   S/P AAA (abdominal aortic aneurysm) repair 08/30/2012   s/p EVAR   S/P CABG x 2 1997   LIMA-LAD, SVG-OM   Sleep apnea    pt. states he was told to return for f/u, to be fitted for Cpap, but pt. reports  that he didn't follow up-no cpap used   Statin myopathy 09/30/2015   Past Surgical History:  Procedure Laterality Date   Abdominal and Lower Extremity Arterial Ultrasound  08/23/2012; 10/10/2013   Normal ABIs. Nonocclusive lower extremity disease. 4.2 cm x 4.3 cm infrarenal AAA;; 4.4 cm x 4.3 cm (essentially stable)    ABDOMINAL AORTIC ENDOVASCULAR STENT GRAFT N/A 08/13/2015   Procedure: ABDOMINAL AORTIC ENDOVASCULAR STENT GRAFT;  Surgeon: Lynwood JONETTA Collum, MD;  Location: [facility] OR;  Service: Vascular;  Laterality: N/A;   Anterior cervical plating  04/23/2010   At C4-5 and a C6-7 utilizing two separate Biomet MaxAn plates.   ANTERIOR LAT LUMBAR FUSION Left 11/22/2012   Procedure: ANTERIOR LATERAL LUMBAR FUSION 1 LEVEL;  Surgeon: Alm GORMAN Molt, MD;  Location: [facility] NEURO ORS;  Service:  Neurosurgery;  Laterality: Left;  Anterior Lateral Lumbar Fusion Lumbar Three-Four   BACK SURGERY  1979 & x 10   pt. remarks, I have had about 10 back surgeries   BILIARY STENT PLACEMENT N/A 07/03/2014   Procedure: BILIARY STENT PLACEMENT;  Surgeon: Lamar JONETTA Aho, MD;  Location: [facility] ENDOSCOPY;  Service: Endoscopy;  Laterality: N/A;   CARPAL TUNNEL RELEASE Left 11/16/2019   Procedure: LEFT CARPAL TUNNEL RELEASE;  Surgeon: Vernetta Lonni GRADE, MD;  Location: [facility] ORS;  Service: Orthopedics;  Laterality: Left;   CARPAL TUNNEL RELEASE Right 01/10/2023   Procedure: RIGHT CARPAL TUNNEL RELEASE;  Surgeon: Molt Alm GORMAN, MD;  Location: [facility] OR;  Service: Neurosurgery;  Laterality: Right;   CATARACT EXTRACTION     Cervical arthrodesis  04/23/2010   Anterior cervical arthrodesis, C4-5, C6-7 utilizing 7-mm PEEK interbody cage packed with local autograft & Antifuse putty at C4-5 & an 8-mm cage at C6-7.   CERVICAL DISCECTOMY  04/23/2010   Decompressive anterior carvical diskectomy. C4-5, C6-7   CHOLECYSTECTOMY N/A 05/23/2015   Procedure: LAPAROSCOPIC CHOLECYSTECTOMY WITH INTRAOPERATIVE CHOLANGIOGRAM;  Surgeon: Alm Angle, MD;  Location: [facility] ORS;  Service: General;  Laterality: N/A;   COLONOSCOPY  1996   CORONARY ANGIOPLASTY  1985,1991,15   1985 lateral STEMI Circumflex PTCA;    CORONARY ARTERY BYPASS GRAFT  1997   LIMA-LAD, SVG-OM (SVG known to be occluded prior to 2004)   CORONARY STENT INTERVENTION N/A 08/20/2021   Procedure: CORONARY STENT INTERVENTION;  Surgeon: Court Dorn PARAS, MD;  Location: [facility] INVASIVE CV LAB;  Service: Cardiovascular:  dLCx distal Edge 99% => Onyx Frontier DES 2.5 x 15 (2.8 mm) overlaps prior stent distally.   ERCP N/A 07/03/2014   Procedure: ENDOSCOPIC RETROGRADE CHOLANGIOPANCREATOGRAPHY (ERCP);  Surgeon: Lamar JONETTA Aho, MD;  Location: [facility] ENDOSCOPY;  Service: Endoscopy;  Laterality: N/A;   ERCP N/A 07/05/2014   Procedure: ENDOSCOPIC RETROGRADE CHOLANGIOPANCREATOGRAPHY  (ERCP);  Surgeon: Lamar JONETTA Aho, MD;  Location: [facility] ENDOSCOPY;  Service: Endoscopy;  Laterality: N/A;   ERCP N/A 06/30/2015   Procedure: ENDOSCOPIC RETROGRADE CHOLANGIOPANCREATOGRAPHY (ERCP);  Surgeon: Gwendlyn ONEIDA Buddy, MD;  Location: [facility];  Service: Endoscopy;  Laterality: N/A;   EYE SURGERY     IR ANGIOGRAM EXTREMITY LEFT  01/18/2017   IR ANGIOGRAM PELVIS SELECTIVE OR SUPRASELECTIVE  01/18/2017   IR ANGIOGRAM SELECTIVE EACH ADDITIONAL VESSEL  01/18/2017   IR AORTAGRAM ABDOMINAL SERIALOGRAM  01/18/2017   IR EMBO ARTERIAL NOT HEMORR HEMANG INC GUIDE ROADMAPPING  01/18/2017   IR RADIOLOGIST EVAL & MGMT  01/04/2017   IR RADIOLOGIST EVAL & MGMT  10/26/2017   IR RADIOLOGIST EVAL & MGMT  05/24/2018   IR RADIOLOGIST EVAL & MGMT  06/19/2019   IR RADIOLOGIST EVAL & MGMT  06/11/2020   IR RADIOLOGIST EVAL & MGMT  07/02/2021   IR RADIOLOGIST EVAL & MGMT  06/22/2022   IR RADIOLOGIST EVAL & MGMT  07/08/2023   IR US  GUIDE VASC ACCESS RIGHT  01/18/2017   KNEE ARTHROSCOPY Left    x 2   LEFT HEART CATH AND CORS/GRAFTS ANGIOGRAPHY  06/2003   None Occluded vein graft to OM; diffuse RCA disease in the mid vessel, 80% circumflex-OM stenosis; follow on AV groove circumflex with sequential 90% stenoses and intervening saccular dilation    LEFT HEART CATH AND CORS/GRAFTS ANGIOGRAPHY  12/2008   4 abnormal Myoview  showing apical thinning (possibly due to apical LAD 95%) : 100% Occluded LAD after SV1, distal LAD grafted via LIMA -apical 95% . Cx -OM1 w/patent stent extending into OM 1 . Follow on Cx - 70-80% - non-amenable PCI. RCA widely patent 3 overlapping stents in mRCA w/less than 40% stenosisin RPL; SVG-OM known occluded    LEFT HEART CATH AND CORS/GRAFTS ANGIOGRAPHY N/A 04/20/2017   Procedure: Left Heart Cath and Cors/Grafts Angiography;  Surgeon: Court Dorn PARAS, MD;  Location: [facility] INVASIVE CV LAB;  LAD now occluded prior to SP1.LIMA-LAD. Patent RCA and circumflex stents. EF 45-50%. Relatively stable.     LEFT HEART CATH AND CORS/GRAFTS ANGIOGRAPHY N/A 08/20/2021   Procedure: LEFT HEART CATH AND CORS/GRAFTS ANGIOGRAPHY;  Surgeon: Court Dorn PARAS, MD;  Location: [facility] INVASIVE CV LAB;  Service: Cardiovascular: CULPRIT 99% dLCx just after stent (Overlapping DES PCI) - otw patent LCx stent, patent RCA stent & Patent LIMA-LAD with CTO of prox LAD.   LEFT HEART CATHETERIZATION WITH CORONARY ANGIOGRAM N/A 05/15/2014   Procedure: LEFT HEART CATHETERIZATION WITH CORONARY ANGIOGRAM;  Surgeon: Victory LELON Claudene DOUGLAS, MD;  Location: [facility] CATH LAB;  Service: Cardiovascular;  Laterality: N/A;   LEFT HEART CATHETERIZATION WITH CORONARY/GRAFT ANGIOGRAM N/A 06/28/2014   Procedure: LEFT HEART CATHETERIZATION WITH EL BILE;  Surgeon: Debby DELENA Sor, MD;  Location: [facility] CATH LAB;  Service: Cardiovascular;  Laterality: N/A;   LUMBAR PERCUTANEOUS PEDICLE SCREW 1 LEVEL N/A 11/22/2012   Procedure: LUMBAR PERCUTANEOUS PEDICLE SCREW 1 LEVEL;  Surgeon: Alm GORMAN Molt, MD;  Location: [facility] NEURO ORS;  Service: Neurosurgery;  Laterality: N/A;  Lumbar Three-Four Percutaneous Pedicle Screw, Lateral approach   MINOR HEMORRHOIDECTOMY     NM MYOVIEW  LTD  05/26/2021   Lexiscan : Intermediate risk (similar findings to last study), with exception of reduced EF roughly 40%..  Prior basal inferior-inferolateral infarct without peri-infarct ischemia.   ->  Echo ordered -> (EF 45 to 50%)-when compared to prior Myoview , infarct appears similar.   PERCUTANEOUS CORONARY STENT INTERVENTION (PCI-S)  06/2003   PCI - RCA 2 overlapping Taxus DES 2.75 mm x 32 mm and 2.75 mm x 12 mm (3.0 mm); PCI-Cx-OM1 - Taxus DES 3.0 mm x 20 mm (3.1 mm);    PERCUTANEOUS CORONARY STENT INTERVENTION (PCI-S)  12/2005   80% ISR in proximal Taxus stent in RCA -- covered proximally with Cypher DES 3.0 mm x 12 mm   PERCUTANEOUS CORONARY STENT INTERVENTION (PCI-S) N/A 05/17/2014   Procedure: PERCUTANEOUS CORONARY STENT INTERVENTION (PCI-S);  Surgeon: Victory LELON Claudene DOUGLAS,  MD;  Location: [facility] CATH LAB: PCI pRCA stent ISR - Promus DES 3.0 x 8 (3.25), OM distal stent edge - Promus DES 2.75 x 12 (3.1)   PERIPHERAL VASCULAR CATHETERIZATION  11/03/2016   Procedure: Embolization;  Surgeon: Penne Lonni Colorado, MD;  Location: [facility] INVASIVE CV LAB;  Service: Cardiovascular;;   POSTERIOR CERVICAL FUSION/FORAMINOTOMY N/A 05/02/2013   Procedure: POSTERIOR CERVICAL FUSION/FORAMINOTOMY CERVICAL SEVEN THORACIC-ONE;  Surgeon: Alm GORMAN Molt, MD;  Location: [facility] NEURO ORS;  Service: Neurosurgery;  Laterality: N/A;  POSTERIOR CERVICAL FUSION/FORAMINOTOMY CERVICAL SEVEN THORACIC-ONE   SPHINCTEROTOMY N/A 06/30/2015   Procedure: SPHINCTEROTOMY;  Surgeon: Gwendlyn ONEIDA Buddy, MD;  Location: [facility] ENDOSCOPY;  Service: Endoscopy;  Laterality: N/A;   TOTAL KNEE ARTHROPLASTY Right    TOTAL KNEE ARTHROPLASTY Left 12/20/2016   Procedure: LEFT TOTAL KNEE ARTHROPLASTY;  Surgeon: Marcey Raman, MD;  Location: [facility] OR;  Service: Orthopedics;  Laterality: Left;   TRANSTHORACIC ECHOCARDIOGRAM  06/10/2021   EF 45 to 50%.  Mildly reduced function.  Mild hypokinesis of basal-mid inferior and inferolateral wall consistent with prior infarct.  Severe LA dilation.  Mild to moderate MR with moderate MAC.  Mild calcific AS (mean gradient 11 mmHg) --> LV function seems unchanged.  Diastolic function improved.  Wall motion abnormality unchanged.  AS unchanged.   TRANSTHORACIC ECHOCARDIOGRAM  07/2018   Normal LV size.  EF 50 to 55% with mild HK of inferolateral wall.  GRII DD.  Mild AS with mean gradient 15 mm or greater.  Mild ascending aortic dilation.  Mildly increased PA pressures of 41 mmHg   UPPER GI ENDOSCOPY  07/03/2019   VISCERAL ANGIOGRAM  11/03/2016   Procedure: Visceral Angiogram;  Surgeon: Penne Lonni Colorado, MD;  Location: [facility] INVASIVE CV LAB;  Service: Cardiovascular;;   Social History:  reports that he quit smoking about 40 years ago. His smoking use included cigarettes. He started smoking about 85  years ago. He has a 135 pack-year smoking history. He has never used smokeless tobacco. He reports current alcohol use of about 7.0 - 14.0 standard drinks of alcohol per week. He reports that he does not use drugs.  Allergies  Allergen Reactions   Roxicodone  [Oxycodone ] Shortness Of Breath and Cough   Statins Other (See Comments)    Myalgias     Zestril [Lisinopril] Cough   Eliquis  [Apixaban ] Other (See Comments)    Blood clots in urine   Welchol [Colesevelam Hcl] Itching    Family History  Problem Relation Age of Onset   Arthritis Mother    Diabetes Father    Heart disease Father    Stroke Sister    Hypertension Sister    Heart disease Sister    Diabetes Sister    Breast cancer Sister    Heart disease Brother    Ulcers Brother    Colon cancer Neg Hx     Prior to Admission medications   Medication Sig Start Date End Date Taking? Authorizing Provider  acetaminophen  (TYLENOL ) 500 MG tablet Take 1,000 mg by mouth every 4 (four) hours as needed for headache (pain).   Yes [provider]  amLODipine  (NORVASC ) 5 MG tablet Take 1 tablet by mouth once daily 11/09/23  Yes Anner Alm ORN, MD  aspirin  EC 81 MG tablet Take 81 mg by mouth daily.   Yes [provider]  Choline Fenofibrate  (FENOFIBRIC ACID ) 135 MG CPDR Take 1 tablet by mouth daily. 05/01/24  Yes Duke, Jon Garre, PA  ezetimibe  (ZETIA ) 10 MG tablet Take 1 tablet by mouth once daily 10/10/23  Yes Anner Alm ORN, MD  ferrous sulfate  325 (65 FE) MG tablet Take 325 mg by mouth daily.   Yes [provider]  finasteride  (PROSCAR ) 5 MG tablet Take 1 tablet (5 mg total) by mouth daily. 11/15/23  Yes Shona Na  C, MD  fluticasone -salmeterol (ADVAIR) 100-50 MCG/ACT AEPB Inhale 1 puff into the lungs 2 (two) times daily. Patient taking differently: Inhale 1 puff into the lungs 2 (two) times daily as needed (bronchitis). 08/24/23  Yes Tysinger, Alm RAMAN, PA-C  methocarbamol  (ROBAXIN ) 500 MG tablet Take 500  mg by mouth every 8 (eight) hours.   Yes [provider]  Multiple Vitamins-Minerals (PRESERVISION AREDS 2+MULTI VIT) CAPS Take 1 capsule by mouth 2 (two) times daily.   Yes [provider]  pantoprazole  (PROTONIX ) 40 MG tablet Take 1 tablet by mouth once daily 07/06/23  Yes Anner Alm ORN, MD  ranolazine  (RANEXA ) 500 MG 12 hr tablet Take 1 tablet by mouth twice daily Patient taking differently: Take 500 mg by mouth daily. 02/29/24  Yes Anner Alm ORN, MD  valsartan -hydrochlorothiazide  (DIOVAN -HCT) 320-12.5 MG tablet Take 1 tablet by mouth once daily 06/18/24  Yes Joyce Norleen BROCKS, MD    Physical Exam: Vitals:   08/01/24 1749 08/01/24 2129 08/02/24 0547 08/02/24 0957  BP: (!) 144/70 (!) 141/67 (!) 145/69 126/73  Pulse: 65 60 72 68  Resp: 17 20 16 18   Temp: (!) 97.4 F (36.3 C) 97.7 F (36.5 C) (!) 97.5 F (36.4 C) 97.8 F (36.6 C)  TempSrc: Oral Oral Oral Oral  SpO2: 100% 100% 100% 100%   Constitutional: Elderly male who appears to be in no acute distress Eyes: PERRL, bruising underneath right eye ENMT: Mucous membranes are moist. Posterior pharynx clear of any exudate or lesions. Neck: normal, supple, no masses, no thyromegaly Respiratory: clear to auscultation bilaterally, no wheezing, no crackles. Normal respiratory effort. No accessory muscle use.  Cardiovascular: Irregular irregular. No extremity edema. 2+ pedal pulses. Abdomen: no tenderness. Bowel sounds positive.  Musculoskeletal: no clubbing / cyanosis.  Currently wearing back brace. Skin: no rashes, lesions, ulcers. No induration Neurologic: CN 2-12 grossly intact. Sensation intact, DTR normal. Strength 5/5 in all 4.  Psychiatric: Normal judgment and insight. Alert and oriented x 3. Normal mood.   Data Reviewed:   Reviewed labs, imaging, and pertinent records as documented.  EKG revealed atrial fibrillation at 68 bpm.   Family Communication:  Primary team communication:  Thank you very much for  involving us  in the care of your patient.  Author: Maximino DELENA Sharps, MD 08/02/2024 4:51 PM  For on call review [email or web address].

## 2024-08-02 NOTE — Progress Notes (Signed)
 Progress Note     Subjective: Patient reports continued pain of his back bilaterally. Denies worsening pain. No BM but having flatulence. Urinating well. Tolerating CLD without nausea or vomiting. Denies CP, SOB, dizziness, vision changes or new concerns.   ROS  All negative with the exception of above.  Objective: Vital signs in last 24 hours: Temp:  [97.4 F (36.3 C)-98.3 F (36.8 C)] 97.5 F (36.4 C) (10/30 0547) Pulse Rate:  [60-82] 72 (10/30 0547) Resp:  [16-20] 16 (10/30 0547) BP: (124-145)/(60-70) 145/69 (10/30 0547) SpO2:  [99 %-100 %] 100 % (10/30 0547) Last BM Date : 07/31/24  Intake/Output from previous day: 10/29 0701 - 10/30 0700 In: 1070 [P.O.:1070] Out: 1200 [Urine:1200] Intake/Output this shift: No intake/output data recorded.  PE: General: Pleasant male who is laying in bed in NAD HEENT: Head is normocephalic. Right periorbital area with ecchymosis. Sclera are noninjected.  EOMI. Ears and nose without any masses or lesions.  Mouth is pink and moist Heart: HR normal during encounter. Palpable radial and pedal pulses bilaterally Lungs: CTAB, no wheezes, rhonchi, or rales noted. Respiratory effort nonlabored. MS: All 4 extremities are symmetrical with no cyanosis, clubbing, or edema.  Skin: Warm and dry. Abrasions noted of bilateral upper extremities. Left knee with abrasion.  Neuro: Speech is normal. Responds to questioning appropriately. Psych: A&Ox3 with an appropriate affect.    Lab Results:  Recent Labs    08/01/24 0439 08/02/24 0353  WBC 6.8 6.8  HGB 9.5* 9.3*  HCT 28.4* 27.5*  PLT 204 200   BMET Recent Labs    08/01/24 0439 08/02/24 0353  NA 138 137  K 4.7 3.9  CL 105 101  CO2 24 24  GLUCOSE 103* 109*  BUN 24* 22  CREATININE 1.61* 1.55*  CALCIUM  9.3 9.1   PT/INR No results for input(s): LABPROT, INR in the last 72 hours. CMP     Component Value Date/Time   NA 137 08/02/2024 0353   NA 141 02/29/2024 1517   K 3.9  08/02/2024 0353   CL 101 08/02/2024 0353   CO2 24 08/02/2024 0353   GLUCOSE 109 (H) 08/02/2024 0353   BUN 22 08/02/2024 0353   BUN 21 02/29/2024 1517   CREATININE 1.55 (H) 08/02/2024 0353   CREATININE 1.65 (H) 03/04/2016 0942   CALCIUM  9.1 08/02/2024 0353   PROT 6.5 07/31/2024 1558   PROT 6.3 02/29/2024 1517   ALBUMIN 3.7 07/31/2024 1558   ALBUMIN 3.9 02/29/2024 1517   AST 23 07/31/2024 1558   ALT 13 07/31/2024 1558   ALKPHOS 39 07/31/2024 1558   BILITOT 1.2 07/31/2024 1558   BILITOT 0.7 02/29/2024 1517   GFRNONAA 43 (L) 08/02/2024 0353   GFRAA 48 (L) 10/22/2020 1100   Lipase     Component Value Date/Time   LIPASE 31.0 05/21/2015 1018       Studies/Results: CT Angio Chest/Abd/Pel for Dissection W and/or Wo Contrast Result Date: 07/31/2024 CLINICAL DATA:  Acute aortic syndrome suspected.  Fall. EXAM: CT ANGIOGRAPHY CHEST, ABDOMEN AND PELVIS TECHNIQUE: Non-contrast CT of the chest was initially obtained. Multidetector CT imaging through the chest, abdomen and pelvis was performed using the standard protocol during bolus administration of intravenous contrast. Multiplanar reconstructed images and MIPs were obtained and reviewed to evaluate the vascular anatomy. RADIATION DOSE REDUCTION: This exam was performed according to the departmental dose-optimization program which includes automated exposure control, adjustment of the mA and/or kV according to patient size and/or use of iterative reconstruction technique. CONTRAST:  80mL OMNIPAQUE  IOHEXOL  350 MG/ML SOLN COMPARISON:  CT abdomen pelvis dated 07/01/2023. FINDINGS: CTA CHEST FINDINGS Cardiovascular: Mild cardiomegaly. No pericardial effusion. There is advanced 3 vessel coronary vascular calcification and calcification of the mitral annulus. Moderate atherosclerotic calcification of the thoracic aorta. No abdominal dilatation or dissection. The origins of the great vessels of the aortic arch and the central pulmonary arteries appear  patent. Mediastinum/Nodes: No hilar or mediastinal adenopathy. Moderate size hiatal hernia. The esophagus is grossly unremarkable. No mediastinal fluid collection. Lungs/Pleura: Trace right pleural effusion. There is minimal subpleural atelectasis of the right lower lobe. There are bibasilar subpleural linear atelectasis/scarring. No consolidative changes or pneumothorax. The central airways are patent. Musculoskeletal: Osteopenia with degenerative changes of the spine. Median sternotomy wires. No acute osseous pathology. Review of the MIP images confirms the above findings. CTA ABDOMEN AND PELVIS FINDINGS VASCULAR Aorta: Advanced atherosclerotic calcification of the abdominal aorta. There is a 7.7 cm infrarenal abdominal aortic aneurysm status post endovascular stent graft repair. The excluded aneurysm sac has increased in size since the prior CT (previously measuring approximately 7 cm in diameter. Evaluation is limited as the precontrast and delayed images of the abdomen are not performed. Faint high attenuating focus within the sac at 8 o'clock position (228/11) may be chronic. An endoleak is not excluded. Celiac: Atherosclerotic calcification of the origin of the celiac trunk. The celiac artery and its major branches are patent. SMA: Atherosclerotic calcification of the origin of the SMA. The SMA is patent. Renals: Atherosclerotic calcification of the origins of the renal arteries. The renal arteries are patent. IMA: The origin IMA is not visualized, likely occluded. Inflow: The iliac arteries are patent. No aneurysmal dilatation or dissection. Veins: No obvious venous abnormality within the limitations of this arterial phase study. Review of the MIP images confirms the above findings. NON-VASCULAR No intra-abdominal free air or free fluid. Hepatobiliary: The liver is unremarkable. Mild biliary dilatation, post cholecystectomy. Pancreas: Unremarkable. No pancreatic ductal dilatation or surrounding inflammatory  changes. Spleen: Normal in size without focal abnormality. Adrenals/Urinary Tract: The right adrenal glands unremarkable. There is a 2.6 cm left adrenal myelolipoma. Mild right renal parenchyma atrophy. Bilateral renal inferior pole scarring. There is no hydronephrosis on either side. The urinary bladder is grossly unremarkable. Stomach/Bowel: There is sigmoid diverticulosis. There is no bowel obstruction or active inflammation. The appendix is normal. Lymphatic: No adenopathy. Reproductive: The prostate is grossly remarkable Other: Right paraspinal hematoma extends from L2-L5 along the right psoas muscle and measures approximately 3.5 x 6 cm in greatest axial dimensions. Musculoskeletal: Osteopenia with degenerative changes of the spine. Minimally displaced triangular fracture of the anterior superior L2 with extension of the fracture into the L2 superior endplate. L2-L3 disc spacer and posterior fusion. Review of the MIP images confirms the above findings. IMPRESSION: 1. No acute intrathoracic pathology. 2. A 7.7 cm infrarenal abdominal aortic aneurysm status post prior endovascular stent graft repair. The excluded aneurysm sac has increased in size since the prior CT. An endoleak is not excluded. Vascular surgery consult is advised. 3. Minimally displaced triangular fracture of the anterior superior L2 with extension of the fracture into the L2 superior endplate. 4. Right paraspinal hematoma extends from L2-L5 along the right psoas muscle and measures approximately 3.5 x 6 cm in greatest axial dimensions. 5. Sigmoid diverticulosis. No bowel obstruction. Normal appendix. 6.  Aortic Atherosclerosis (ICD10-I70.0). Electronically Signed   By: Vanetta Chou M.D.   On: 07/31/2024 18:49   CT L-SPINE NO CHARGE Result Date: 07/31/2024  CLINICAL DATA:  Fall.  Trauma to the back.  Back pain. EXAM: CT THORACIC AND LUMBAR SPINE WITHOUT CONTRAST TECHNIQUE: Multidetector CT imaging of the thoracic and lumbar spine was  performed without contrast. Multiplanar CT image reconstructions were also generated. RADIATION DOSE REDUCTION: This exam was performed according to the departmental dose-optimization program which includes automated exposure control, adjustment of the mA and/or kV according to patient size and/or use of iterative reconstruction technique. COMPARISON:  CT abdomen pelvis dated 07/01/2023. FINDINGS: CT THORACIC SPINE FINDINGS Alignment: No acute subluxation. Vertebrae: No acute fracture.  Osteopenia. Paraspinal and other soft tissues: No paraspinal fluid collection hematoma. Disc levels: No acute findings.  Degenerative changes. CT LUMBAR SPINE FINDINGS Segmentation: 5 lumbar type vertebrae. Alignment: No acute subluxation. Vertebrae: Minimally displaced fracture of the anterior corner of the superior endplate of L2. There is extension of the fracture to the superior endplate. No other acute fracture. There is advanced osteopenia which limits evaluation for fracture. Paraspinal and other soft tissues: Right paraspinal hematoma extends from L2 down to L5 posterior to the right psoas muscle. Disc levels: L2-L3 and L3-L4 disc spacers. There is L2-L3 posterior fusion. Multilevel degenerative changes and facet arthropathy. IMPRESSION: 1. Minimally displaced fracture of the anterior corner of the superior endplate of L2. 2. Right paraspinal hematoma extends from L2 down to L5 posterior to the right psoas muscle. 3. No acute/traumatic thoracic spine pathology. Electronically Signed   By: Vanetta Chou M.D.   On: 07/31/2024 18:35   CT T-SPINE NO CHARGE Result Date: 07/31/2024 CLINICAL DATA:  Fall.  Trauma to the back.  Back pain. EXAM: CT THORACIC AND LUMBAR SPINE WITHOUT CONTRAST TECHNIQUE: Multidetector CT imaging of the thoracic and lumbar spine was performed without contrast. Multiplanar CT image reconstructions were also generated. RADIATION DOSE REDUCTION: This exam was performed according to the departmental  dose-optimization program which includes automated exposure control, adjustment of the mA and/or kV according to patient size and/or use of iterative reconstruction technique. COMPARISON:  CT abdomen pelvis dated 07/01/2023. FINDINGS: CT THORACIC SPINE FINDINGS Alignment: No acute subluxation. Vertebrae: No acute fracture.  Osteopenia. Paraspinal and other soft tissues: No paraspinal fluid collection hematoma. Disc levels: No acute findings.  Degenerative changes. CT LUMBAR SPINE FINDINGS Segmentation: 5 lumbar type vertebrae. Alignment: No acute subluxation. Vertebrae: Minimally displaced fracture of the anterior corner of the superior endplate of L2. There is extension of the fracture to the superior endplate. No other acute fracture. There is advanced osteopenia which limits evaluation for fracture. Paraspinal and other soft tissues: Right paraspinal hematoma extends from L2 down to L5 posterior to the right psoas muscle. Disc levels: L2-L3 and L3-L4 disc spacers. There is L2-L3 posterior fusion. Multilevel degenerative changes and facet arthropathy. IMPRESSION: 1. Minimally displaced fracture of the anterior corner of the superior endplate of L2. 2. Right paraspinal hematoma extends from L2 down to L5 posterior to the right psoas muscle. 3. No acute/traumatic thoracic spine pathology. Electronically Signed   By: Vanetta Chou M.D.   On: 07/31/2024 18:35   CT Maxillofacial Wo Contrast Result Date: 07/31/2024 EXAM: CT OF THE FACE WITHOUT CONTRAST 07/31/2024 05:46:11 PM TECHNIQUE: CT of the face was performed without the administration of intravenous contrast. Multiplanar reformatted images are provided for review. Automated exposure control, iterative reconstruction, and/or weight based adjustment of the mA/kV was utilized to reduce the radiation dose to as low as reasonably achievable. COMPARISON: None available. CLINICAL HISTORY: Facial trauma, blunt. 88 yo male tripped while walking through doorway on  Thursday. States hit head on rocking chair, light fixture fell on back. Reports headache. Reports mid back pain and generalized abd pain. FINDINGS: FACIAL BONES: Mandibular heads are normally positioned. No mandibular fracture. Pterygoid plates and zygomatic arches appear intact. No acute findings nasal bone fracture. No suspicious bone lesion. ORBITS: Globes are intact. No acute traumatic injury. No inflammatory change. SINUSES AND MASTOIDS: Mastoid air cells are clear. Mild mucosal thickening of the sinuses without fluid level or sinus wall fracture. SOFT TISSUES: No acute abnormality. IMPRESSION: 1. No acute facial fracture. Electronically signed by: Luke Bun MD 07/31/2024 06:15 PM EDT RP Workstation: [id number]   DG Knee Complete 4 Views Right Result Date: 07/31/2024 EXAM: 4 OR MORE VIEW(S) XRAY OF THE RIGHT KNEE 07/31/2024 05:48:00 PM COMPARISON: None available. CLINICAL HISTORY: Trauma. Tripped while walking through doorway on Thursday. Bilateral knees and right elbow pain. FINDINGS: BONES AND JOINTS: Right knee replacement with normal alignment. No acute fracture. No focal osseous lesion. No joint dislocation. Trace knee effusion. SOFT TISSUES: Vascular calcifications. IMPRESSION: 1. No acute fracture. 2. Right knee arthroplasty with normal alignment. 3. Trace knee effusion. Electronically signed by: Luke Bun MD 07/31/2024 06:06 PM EDT RP Workstation: [id number]   DG Knee Complete 4 Views Left Result Date: 07/31/2024 EXAM: 4 OR MORE VIEW(S) XRAY OF THE LEFT KNEE 07/31/2024 05:48:00 PM COMPARISON: None available. CLINICAL HISTORY: trauma. Tripped while walking through doorway on thursday. Bilateral knees and right elbow pain. FINDINGS: BONES AND JOINTS: Knee replacement with normal alignment. No definitive acute displaced fracture. No focal osseous lesion. Trace knee effusion. SOFT TISSUES: Vascular calcifications. The soft tissues are unremarkable. IMPRESSION: 1. No acute displaced fracture.  2. Trace knee effusion. 3. Status post knee arthroplasty with normal alignment. Electronically signed by: Luke Bun MD 07/31/2024 06:05 PM EDT RP Workstation: [id number]   DG Elbow Complete Right Result Date: 07/31/2024 EXAM: 3 VIEW(S) XRAY OF THE RIGHT ELBOW COMPARISON: None available. CLINICAL HISTORY: trauma. Tripped while walking through doorway on thursday. Bilateral knees and right elbow pain. FINDINGS: BONES AND JOINTS: No definitive fracture or malalignment. No joint dislocation. No significant effusion. SOFT TISSUES: Soft tissue calcifications at the posterior elbow. Vascular calcifications. IMPRESSION: 1. No acute fracture or malalignment. 2. No significant elbow joint effusion. Electronically signed by: Luke Bun MD 07/31/2024 06:03 PM EDT RP Workstation: [id number]   CT Head Wo Contrast Result Date: 07/31/2024 EXAM: CT HEAD WITHOUT CONTRAST 07/31/2024 05:46:11 PM TECHNIQUE: CT of the head was performed without the administration of intravenous contrast. Automated exposure control, iterative reconstruction, and/or weight based adjustment of the mA/kV was utilized to reduce the radiation dose to as low as reasonably achievable. COMPARISON: None available. CLINICAL HISTORY: Head trauma, intracranial venous injury suspected. 88 yo male tripped while walking through doorway on Thursday. States hit head on rocking chair, light fixture fell on back. Reports headache. Reports mid back pain and generalized abd pain. FINDINGS: BRAIN AND VENTRICLES: No acute territorial infarction, hemorrhage, or intracranial mass. Atrophy and mild chronic small vessel ischemic changes of the white matter. Nonenlarged ventricles. No extra-axial collection. Multiple small chronic cerebellar infarcts. Small chronic left frontal lobe infarct near the vertex. Small chronic infarct within the medial left temporal lobe, sagittal series 10 image 48. Chronic lacunar infarcts within the thalamus. Vertebral and carotid vascular  calcification. ORBITS: No acute abnormality. SINUSES: No acute abnormality. SOFT TISSUES AND SKULL: No acute soft tissue abnormality. No skull fracture. IMPRESSION: 1. No acute intracranial abnormality. 2. Atrophy with mild chronic small vessel ischemic disease. 3.  Multifocal chronic-appearing infarcts as described above. Electronically signed by: Luke Bun MD 07/31/2024 06:02 PM EDT RP Workstation: [id number]   CT Cervical Spine Wo Contrast Result Date: 07/31/2024 EXAM: CT CERVICAL SPINE WITHOUT CONTRAST 07/31/2024 05:46:11 PM TECHNIQUE: CT of the cervical spine was performed without the administration of intravenous contrast. Multiplanar reformatted images are provided for review. Automated exposure control, iterative reconstruction, and/or weight based adjustment of the mA/kV was utilized to reduce the radiation dose to as low as reasonably achievable. COMPARISON: CT cervical spine 02/08/2011. CLINICAL HISTORY: Neck trauma (Age >= 65y). 88 yo male tripped while walking through doorway on Thursday. States hit head on rocking chair, light fixture fell on back. Reports headache. Reports mid back pain and generalized abd pain. FINDINGS: CERVICAL SPINE: BONES AND ALIGNMENT: Small focal lucency involving the left lateral mass of C2, present on the 2012 study, possibly a nutrient vessel foramen. Anterior cervical fusion hardware at C4-C5 and C6-C7. Posterior instrumented fusion from C6 to T1. Solid osseous fusion from C4 to C7 levels. No solid osseous fusion demonstrated across the C7-T1 level. Hardware is intact. No compression fracture or displaced fracture demonstrated within the cervical spine. Sequelae of laminectomy at C7-T1. DEGENERATIVE CHANGES: Atherosclerosis at the carotid bifurcations. Degenerative endplate osteophytes at multiple levels. Disc bulge at C2-C3 resulting in at least mild spinal canal stenosis. Disc osteophyte complex at C3-C4 results in mild to moderate spinal canal stenosis. Facet  arthrosis and uncovertebral hypertrophy at multiple levels resulting in significant foraminal stenosis, most pronounced at C3-C4. SOFT TISSUES: No prevertebral soft tissue swelling. IMPRESSION: 1. No acute abnormality of the cervical spine related to reported neck trauma. 2. Degenerative changes as above. 3. Anterior cervical fusion hardware at C4-5 and C6-7, with additional posterior instrumented fusion from C6 to T1. Solid osseous fusion from C4 to C7 levels. No solid osseous fusion demonstrated across the C7-T1 level. Hardware is intact. Electronically signed by: Donnice Mania MD 07/31/2024 05:59 PM EDT RP Workstation: [id number]    Anti-infectives: Anti-infectives (From admission, onward)    None        Assessment/Plan 88 y/o M who experienced a mechanical fall and then had a lamp fall onto his back   L2 endplate fx with associated paraspinal hematoma  - Neurosurgery has consulted. Superior endplate fracture of L2 considered minor and should be stable. Recommended back brace, PT, OT, and pain control. Follow up outpatient for serial x-rays.   AAA s/p repair and embolization for endoleak  - Dr. Lucas called by EDP and reported this looks stable. Dr. Serene consulted and recommended outpatient follow-up with radiology. No inpatient treatment necessary.  CAD s/p CABG without angina:  Ischemic cardiomyopathy/HFrEF:  Paroxysmal atrial fib:  -Cardiology consulted. Recommended consulting IM and transferring to their inpatient service. Recommended resuming ASA, Ranexa , Norvasc , Zetia  and Diovan .  -EKG 10/29. He is not on long term anti-coagulation due to GI bleeding.  -Will have IM consult    FEN - CLD VTE - SCDs, Holding ID - None      LOS: 1 day   I reviewed consulting provider notes, specialist notes, nursing notes, last 24 h vitals and pain scores, last 48 h intake and output, last 24 h labs and trends, and last 24 h imaging results.  This care required moderate level of  medical decision making.    Marjorie Carlyon Favre, [facility] Surgery 08/02/2024, 8:30 AM Please see Amion for pager number during day hours 7:00am-4:30pm

## 2024-08-02 NOTE — Consult Note (Signed)
 [facility]  Patient name: [name] MRN: [id number] DOB: 05/02/1936 Sex: male   REQUESTING PROVIDER:    Hospital service   REASON FOR CONSULT:    AAA  HISTORY OF PRESENT ILLNESS:   [name] is a 88 y.o. male, who presented to [facility] on 08/01/2024 several days after a fall complaining of worsening pain.  CT scan shows a displaced fracture at L2 and a right paraspinal and psoas hematoma.  Also found on his scan was a 7.7 cm aneurysm with endoleak.  The patient has a history of endovascular aneurysm repair in 2016.  He has also undergone coil embolization of the IMA.  At that time of embolization in 2018 the aneurysm measured 5.5 cm.  In 2024 it measured 7.0 cm.  He is being followed by interventional radiology as well.  He has had 2 endoleak repairs.  At his last visit in 2023 he was reluctant to proceed with repeat endoleak repair.  He was supposed to follow-up in a year but this never happened.  PAST MEDICAL HISTORY    Past Medical History:  Diagnosis Date   Adenomatous colon polyp    Ankle edema    Chronic   Asthma    CAD S/P percutaneous coronary angioplasty 2004   a) '04: Staged Taxus DES PCI to RCA (2 Taxus 2.75 x 32 & 12) and Cx-OM (Taxus 3 x 20);;'07 - PCI to pRCA ISR- Cypher DES; c) 05/2014: pPCI pRCA stentISR (Promus DES 3 x 8) OM1 distal to stent (Promus DES 2.75 x 12 - 3.1); 11/'22: distal LCx stent edge 99% -> Onyx Frontier DES 2.5 x 15 - 2.8 (overlaps prior stent)   CAD, multiple vessel 1985   Most recent July 2018: Admitted for non-STEMI. Occluded LAD with patent LIMA (SP1 now occluded).  Patent stents in proximal and mid RCA as well as circumflex-OM 3. Known occlusion of SVG-OM. EF 45-50%   CHF (congestive heart failure) (HCC)    Cholelithiasis - with cholangitis & choledocholithiasis    status post ERCP with removal of calculi and biliary stent placement.   Chronic anemia     On iron supplement; history of positive guaiac - negative colonoscopy in 1996.; Thought to be related to hemorrhoids; status post hemorrhoidectomy   Chronic back pain     multiple surgeries; C-spine and lumbar   Diabetes mellitus    no meds- pt states he is no longer DM   Diverticulitis of colon 1996   Diverticulosis    Dyslipidemia, goal LDL below 70    Erectile dysfunction    Exertional dyspnea    Chronic baseline SOB with ambulation   GERD (gastroesophageal reflux disease)    Grade II diastolic dysfunction    H/O: pneumonia 11/2012   Hemorrhoids    Hiatal hernia    History of: ST elevation myocardial infarction (STEMI) involving left circumflex coronary artery with complication 1985   PTCA-circumflex; PCI in 1991   Hypertension    Moderate aortic stenosis by prior echocardiogram 07/2015   Normal LV function - EF 55-60%.. Abnormal relaxation. Mild-moderate aortic stenosis (peak/mean gradient 18/10 mmH)   Osteoarthritis of both knees    And back; multiple back surgeries, right knee arthroplasty and left knee arthroscopic surgery x2   Pneumonia 2016   S/P AAA (abdominal aortic aneurysm) repair 08/30/2012   s/p EVAR   S/P CABG x 2 1997   LIMA-LAD, SVG-OM   Sleep apnea  pt. states he was told to return for f/u, to be fitted for Cpap, but pt. reports that he didn't follow up-no cpap used   Statin myopathy 09/30/2015     FAMILY HISTORY   Family History  Problem Relation Age of Onset   Arthritis Mother    Diabetes Father    Heart disease Father    Stroke Sister    Hypertension Sister    Heart disease Sister    Diabetes Sister    Breast cancer Sister    Heart disease Brother    Ulcers Brother    Colon cancer Neg Hx     SOCIAL HISTORY:   Social History   Socioeconomic History   Marital status: Married    Spouse name: Not on file   Number of children: Not on file   Years of education: Not on file   Highest education level: Not on file  Occupational History    Not on file  Tobacco Use   Smoking status: Former    Current packs/day: 0.00    Average packs/day: 3.0 packs/day for 45.0 years (135.0 ttl pk-yrs)    Types: Cigarettes    Start date: 10/04/1936    Quit date: 10/05/1983    Years since quitting: 40.8   Smokeless tobacco: Never  Vaping Use   Vaping status: Never Used  Substance and Sexual Activity   Alcohol use: Yes    Alcohol/week: 7.0 - 14.0 standard drinks of alcohol    Types: 7 - 14 Cans of beer per week    Comment: 1-2 beers per day   Drug use: No   Sexual activity: Not Currently  Other Topics Concern   Not on file  Social History Narrative   He is married, father of two, grandfather to four, great grandfather to two.    Not really getting much exercise now, do to his significant back and hip pain.    He does not smoke and only has an alcoholic beverage.    Social Drivers of Corporate Investment Banker Strain: Not on file  Food Insecurity: No Food Insecurity (08/01/2024)   Hunger Vital Sign    Worried About Running Out of Food in the Last Year: Never true    Ran Out of Food in the Last Year: Never true  Transportation Needs: No Transportation Needs (08/01/2024)   PRAPARE - Administrator, Civil Service (Medical): No    Lack of Transportation (Non-Medical): No  Physical Activity: Not on file  Stress: Not on file  Social Connections: Socially Integrated (08/01/2024)   Social Connection and Isolation Panel    Frequency of Communication with Friends and Family: Three times a week    Frequency of Social Gatherings with Friends and Family: Twice a week    Attends Religious Services: More than 4 times per year    Active Member of [employer] or Organizations: Yes    Attends Engineer, Structural: More than 4 times per year    Marital Status: Married  Catering Manager Violence: Not At Risk (08/01/2024)   Humiliation, Afraid, Rape, and Kick questionnaire    Fear of Current or Ex-Partner: No    Emotionally Abused:  No    Physically Abused: No    Sexually Abused: No    ALLERGIES:    Allergies  Allergen Reactions   Roxicodone  [Oxycodone ] Shortness Of Breath and Cough   Statins Other (See Comments)    Myalgias     Zestril [Lisinopril] Cough  Eliquis  [Apixaban ] Other (See Comments)    Blood clots in urine   Welchol [Colesevelam Hcl] Itching    CURRENT MEDICATIONS:    Current Facility-Administered Medications  Medication Dose Route Frequency Provider Last Rate Last Admin   acetaminophen  (TYLENOL ) tablet 1,000 mg  1,000 mg Oral Q6H Polly Cordella LABOR, MD   1,000 mg at 08/02/24 0047   albuterol  (PROVENTIL ) (2.5 MG/3ML) 0.083% nebulizer solution 3 mL  3 mL Inhalation Q6H PRN Polly Cordella LABOR, MD       docusate sodium  (COLACE) capsule 100 mg  100 mg Oral BID Polly Cordella LABOR, MD   100 mg at 08/01/24 2132   finasteride  (PROSCAR ) tablet 5 mg  5 mg Oral Daily Polly Cordella LABOR, MD   5 mg at 08/01/24 9165   hydrALAZINE  (APRESOLINE ) injection 10 mg  10 mg Intravenous Q2H PRN Metzger, Gregory A, MD       insulin  aspart (novoLOG ) injection 0-15 Units  0-15 Units Subcutaneous TID [facility] Polly Cordella LABOR, MD   2 Units at 08/01/24 1735   insulin  aspart (novoLOG ) injection 0-5 Units  0-5 Units Subcutaneous QHS Polly Cordella LABOR, MD       methocarbamol  (ROBAXIN ) tablet 500 mg  500 mg Oral Q8H Polly Cordella LABOR, MD   500 mg at 08/02/24 9742   Or   methocarbamol  (ROBAXIN ) injection 500 mg  500 mg Intravenous Q8H Polly Cordella LABOR, MD       metoprolol  tartrate (LOPRESSOR ) injection 5 mg  5 mg Intravenous Q6H PRN Polly Cordella LABOR, MD       morphine  (PF) 2 MG/ML injection 2 mg  2 mg Intravenous Q4H PRN Polly Cordella LABOR, MD   2 mg at 08/01/24 9373   morphine  (PF) 4 MG/ML injection 4 mg  4 mg Intravenous Once Patt Alm Macho, MD       ondansetron  (ZOFRAN -ODT) disintegrating tablet 4 mg  4 mg Oral Q6H PRN Polly Cordella LABOR, MD       Or   ondansetron  (ZOFRAN ) injection 4 mg  4 mg Intravenous Q6H PRN  Polly Cordella LABOR, MD       pantoprazole  (PROTONIX ) EC tablet 40 mg  40 mg Oral Daily Polly Cordella LABOR, MD   40 mg at 08/01/24 9165   polyethylene glycol (MIRALAX  / GLYCOLAX ) packet 17 g  17 g Oral Daily PRN Polly Cordella LABOR, MD       traMADol  (ULTRAM ) tablet 25 mg  25 mg Oral Q6H PRN Polly Cordella LABOR, MD        REVIEW OF SYSTEMS:   [X]  denotes positive finding, [ ]  denotes negative finding Cardiac  Comments:  Chest pain or chest pressure:    Shortness of breath upon exertion:    Short of breath when lying flat:    Irregular heart rhythm:        Vascular    Pain in calf, thigh, or hip brought on by ambulation:    Pain in feet at night that wakes you up from your sleep:     Blood clot in your veins:    Leg swelling:         Pulmonary    Oxygen  at home:    Productive cough:     Wheezing:         Neurologic    Sudden weakness in arms or legs:     Sudden numbness in arms or legs:     Sudden onset of difficulty speaking or slurred speech:  Temporary loss of vision in one eye:     Problems with dizziness:         Gastrointestinal    Blood in stool:      Vomited blood:         Genitourinary    Burning when urinating:     Blood in urine:        Psychiatric    Major depression:         Hematologic    Bleeding problems:    Problems with blood clotting too easily:        Skin    Rashes or ulcers:        Constitutional    Fever or chills:     PHYSICAL EXAM:   Vitals:   08/01/24 1004 08/01/24 1749 08/01/24 2129 08/02/24 0547  BP: 124/60 (!) 144/70 (!) 141/67 (!) 145/69  Pulse: 82 65 60 72  Resp: 16 17 20 16   Temp: 98.3 F (36.8 C) (!) 97.4 F (36.3 C) 97.7 F (36.5 C) (!) 97.5 F (36.4 C)  TempSrc: Oral Oral Oral Oral  SpO2: 99% 100% 100% 100%    GENERAL: The patient is a well-nourished male, in no acute distress. The vital signs are documented above. CARDIAC: There is a regular rate and rhythm. PULMONARY: Nonlabored respirations ABDOMEN: Soft and  non-tender  MUSCULOSKELETAL: There are no major deformities or cyanosis. NEUROLOGIC: No focal weakness or paresthesias are detected. SKIN: There are no ulcers or rashes noted. PSYCHIATRIC: The patient has a normal affect.  STUDIES:   I have reviewed the following CTA: 1. No acute intrathoracic pathology. 2. A 7.7 cm infrarenal abdominal aortic aneurysm status post prior endovascular stent graft repair. The excluded aneurysm sac has increased in size since the prior CT. An endoleak is not excluded. Vascular surgery consult is advised. 3. Minimally displaced triangular fracture of the anterior superior L2 with extension of the fracture into the L2 superior endplate. 4. Right paraspinal hematoma extends from L2-L5 along the right psoas muscle and measures approximately 3.5 x 6 cm in greatest axial dimensions. 5. Sigmoid diverticulosis. No bowel obstruction. Normal appendix. 6.  Aortic Atherosclerosis (ICD10-I70.0).  ASSESSMENT and PLAN   AAA: The patient continues to have aneurysmal sac enlargement with known type II endoleak.  He has undergone 2 separate interventions for endoleak repair which still persists.  He was last seen in the office in 2023 by us  and radiology and was reluctant to proceed with additional repair.  However at this time the measurement of the aneurysm is 7.7 cm.  I would recommend outpatient follow-up with radiology.  No inpatient treatment is necessary especially in the setting of a recent fall   Malvina New, IV, MD, FACS [facility] Specialists of [facility] [phone] Pager [phone]

## 2024-08-02 NOTE — Plan of Care (Signed)
  Problem: Coping: Goal: Level of anxiety will decrease Outcome: Progressing   Problem: Clinical Measurements: Goal: Respiratory complications will improve Outcome: Progressing   Problem: Pain Managment: Goal: General experience of comfort will improve and/or be controlled Outcome: Progressing   Problem: Safety: Goal: Ability to remain free from injury will improve Outcome: Progressing   Problem: Skin Integrity: Goal: Risk for impaired skin integrity will decrease Outcome: Progressing   Problem: Skin Integrity: Goal: Risk for impaired skin integrity will decrease Outcome: Progressing

## 2024-08-02 NOTE — Evaluation (Signed)
 Occupational Therapy Evaluation Patient Details Name: [name] MRN: [id number] DOB: 08-Mar-1936 Today's Date: 08/02/2024   History of Present Illness   88 y.o. male presents to [facility] hospital on 07/31/2024 after a recent fall with persistent back pain. CT scan shows a small chip fx at the L2 superior endplate, also paraspinal hematoma. PMH includes asthma, CAD, CHF, DM, diverticulosis, HLD, GERD, HTN,     Clinical Impressions PTA Pt was Mod I with RW for functional mobility and required assistance with ADL and IADL tasks. Pt with hx of falls and back complications. Pt currently requires up to Min A for functional transfers and up to Max A for ADL engagement. Pt educated on back precautions and functional implications of precautions on ADL tasks. Pt is primarily limited by generalized weakness, unsteadiness on feet, decreased knowledge of DME, and pain. Pt will benefit from acute skilled OT services and continued education on AE to facilitate independence with ADL tasks. OT to recommend HHOT at discharge to maximize functional abilities and reduce burden of care.      If plan is discharge home, recommend the following:   A little help with walking and/or transfers;A lot of help with bathing/dressing/bathroom;Assistance with cooking/housework;Assist for transportation;Help with stairs or ramp for entrance     Functional Status Assessment   Patient has had a recent decline in their functional status and demonstrates the ability to make significant improvements in function in a reasonable and predictable amount of time.     Equipment Recommendations   None recommended by OT     Recommendations for Other Services         Precautions/Restrictions   Precautions Precautions: Fall;Back Precaution Booklet Issued: Yes (comment) Recall of Precautions/Restrictions: Intact Required Braces or Orthoses:  (family brought in brace for comfort) Restrictions Weight Bearing  Restrictions Per Provider Order: No     Mobility Bed Mobility Overal bed mobility: Needs Assistance Bed Mobility: Rolling, Sidelying to Sit Rolling: Supervision, Used rails Sidelying to sit: Min assist, Used rails       General bed mobility comments: Min A to elevate trunk from sidelying.    Transfers Overall transfer level: Needs assistance Equipment used: Rolling walker (2 wheels) Transfers: Sit to/from Stand, Bed to chair/wheelchair/BSC Sit to Stand: Min assist     Step pivot transfers: Min assist     General transfer comment: Bed elevated slightly, verbal cues for proper hand placement. Min A for power up and verbal cues to maintain body within RW.      Balance Overall balance assessment: Needs assistance Sitting-balance support: No upper extremity supported, Feet supported Sitting balance-Leahy Scale: Fair     Standing balance support: Bilateral upper extremity supported, During functional activity, Reliant on assistive device for balance Standing balance-Leahy Scale: Poor Standing balance comment: dependent on RW                           ADL either performed or assessed with clinical judgement   ADL Overall ADL's : Needs assistance/impaired Eating/Feeding: Independent   Grooming: Set up;Sitting   Upper Body Bathing: Minimal assistance;Sitting   Lower Body Bathing: Maximal assistance;Sitting/lateral leans   Upper Body Dressing : Minimal assistance;Sitting   Lower Body Dressing: Maximal assistance;Sitting/lateral leans   Toilet Transfer: Minimal assistance;Ambulation   Toileting- Clothing Manipulation and Hygiene: Moderate assistance       Functional mobility during ADLs: Minimal assistance;Rolling walker (2 wheels) General ADL Comments: Pt requiring assist at baseline  Vision Patient Visual Report: No change from baseline Vision Assessment?: No apparent visual deficits     Perception         Praxis         Pertinent  Vitals/Pain Pain Assessment Pain Assessment: 0-10 Pain Score: 10-Worst pain ever Pain Location: back Pain Descriptors / Indicators: Sharp Pain Intervention(s): Limited activity within patient's tolerance, Monitored during session     Extremity/Trunk Assessment Upper Extremity Assessment Upper Extremity Assessment: Generalized weakness   Lower Extremity Assessment Lower Extremity Assessment: Defer to PT evaluation   Cervical / Trunk Assessment Cervical / Trunk Assessment: Kyphotic   Communication Communication Communication: No apparent difficulties   Cognition Arousal: Alert Behavior During Therapy: WFL for tasks assessed/performed Cognition: No apparent impairments                               Following commands: Intact       Cueing  General Comments   Cueing Techniques: Verbal cues;Visual cues  Pt educated on back precautions. Hnadout provided.   Exercises     Shoulder Instructions      Home Living Family/patient expects to be discharged to:: Private residence Living Arrangements: Spouse/significant other Available Help at Discharge: Family;Available 24 hours/day Type of Home: House Home Access: Stairs to enter [employer] of Steps: 2 Entrance Stairs-Rails: Right Home Layout: Multi-level;Able to live on main level with bedroom/bathroom Alternate Level Stairs-Number of Steps: flight   Bathroom Shower/Tub: Producer, Television/film/video: Standard Bathroom Accessibility: No   Home Equipment: Agricultural Consultant (2 wheels);Cane - single point;BSC/3in1;Adaptive equipment;Shower Engineering Geologist: Reacher        Prior Functioning/Environment Prior Level of Function : History of Falls (last six months);Needs assist;Driving             Mobility Comments: ambulatory with RW typically, pt reports all of his recent falls have occurred when he is not holding onto his walker ADLs Comments: Assist with LB dtressing, toileting,  bathing. Had to bathe outside of shower. Required assist with all IADLs including medication management. Spouse drives most of the time.    OT Problem List: Decreased strength;Decreased activity tolerance;Impaired balance (sitting and/or standing);Decreased safety awareness;Decreased knowledge of use of DME or AE;Decreased knowledge of precautions;Impaired UE functional use;Pain   OT Treatment/Interventions: Self-care/ADL training;Therapeutic exercise;Energy conservation;DME and/or AE instruction;Therapeutic activities;Patient/family education;Balance training      OT Goals(Current goals can be found in the care plan section)   Acute Rehab OT Goals Patient Stated Goal: get better OT Goal Formulation: With patient Time For Goal Achievement: 08/16/24 Potential to Achieve Goals: Good ADL Goals Pt Will Perform Grooming: with contact guard assist;standing Pt Will Perform Lower Body Bathing: with min assist;with adaptive equipment;sitting/lateral leans Pt Will Perform Lower Body Dressing: with min assist;with adaptive equipment;sitting/lateral leans Pt Will Transfer to Toilet: with supervision;ambulating Pt/caregiver will Perform Home Exercise Program: Both right and left upper extremity;With theraband;With written HEP provided Additional ADL Goal #1: Pt will engage in bed mobility utilizing log roll technique with supervision.   OT Frequency:  Min 2X/week    Co-evaluation              AM-PAC OT 6 Clicks Daily Activity     Outcome Measure Help from another person eating meals?: None Help from another person taking care of personal grooming?: A Little Help from another person toileting, which includes using toliet, bedpan, or urinal?: A Lot Help from another person bathing (  including washing, rinsing, drying)?: A Lot Help from another person to put on and taking off regular upper body clothing?: A Little Help from another person to put on and taking off regular lower body  clothing?: A Lot 6 Click Score: 16   End of Session Equipment Utilized During Treatment: Gait belt;Rolling walker (2 wheels);Back brace  Activity Tolerance: Patient tolerated treatment well Patient left: in chair;with call bell/phone within reach;with chair alarm set  OT Visit Diagnosis: Unsteadiness on feet (R26.81);Repeated falls (R29.6);Muscle weakness (generalized) (M62.81);History of falling (Z91.81);Pain Pain - part of body:  (back)                Time: 9144-9086 OT Time Calculation (min): 18 min Charges:  OT General Charges $OT Visit: 1 Visit OT Evaluation $OT Eval Moderate Complexity: 1 Mod  Maurilio CROME, OTR/L.  [facility] Acute Rehabilitation  Office: [phone]   Maurilio PARAS Brissa Asante 08/02/2024, 10:16 AM

## 2024-08-03 ENCOUNTER — Inpatient Hospital Stay (HOSPITAL_COMMUNITY)

## 2024-08-03 DIAGNOSIS — T1490XA Injury, unspecified, initial encounter: Secondary | ICD-10-CM | POA: Diagnosis not present

## 2024-08-03 LAB — BASIC METABOLIC PANEL WITH GFR
Anion gap: 10 (ref 5–15)
BUN: 20 mg/dL (ref 8–23)
CO2: 24 mmol/L (ref 22–32)
Calcium: 8.9 mg/dL (ref 8.9–10.3)
Chloride: 103 mmol/L (ref 98–111)
Creatinine, Ser: 1.54 mg/dL — ABNORMAL HIGH (ref 0.61–1.24)
GFR, Estimated: 44 mL/min — ABNORMAL LOW (ref >60)
Glucose, Bld: 94 mg/dL (ref 70–99)
Potassium: 3.7 mmol/L (ref 3.5–5.1)
Sodium: 137 mmol/L (ref 135–145)

## 2024-08-03 LAB — CBC
HCT: 27.2 % — ABNORMAL LOW (ref 39.0–52.0)
Hemoglobin: 9.1 g/dL — ABNORMAL LOW (ref 13.0–17.0)
MCH: 31.7 pg (ref 26.0–34.0)
MCHC: 33.5 g/dL (ref 30.0–36.0)
MCV: 94.8 fL (ref 80.0–100.0)
Platelets: 236 K/uL (ref 150–400)
RBC: 2.87 MIL/uL — ABNORMAL LOW (ref 4.22–5.81)
RDW: 14.8 % (ref 11.5–15.5)
WBC: 5.8 K/uL (ref 4.0–10.5)
nRBC: 0 % (ref 0.0–0.2)

## 2024-08-03 LAB — GLUCOSE, CAPILLARY
Glucose-Capillary: 94 mg/dL (ref 70–99)
Glucose-Capillary: 118 mg/dL — ABNORMAL HIGH (ref 70–99)
Glucose-Capillary: 124 mg/dL — ABNORMAL HIGH (ref 70–99)
Glucose-Capillary: 140 mg/dL — ABNORMAL HIGH (ref 70–99)

## 2024-08-03 MED ORDER — HYDROCODONE-ACETAMINOPHEN 5-325 MG PO TABS
1.0000 | ORAL_TABLET | ORAL | Status: DC | PRN
  Administered 2024-08-04 – 2024-08-05 (×2): 1 via ORAL
  Filled 2024-08-03 (×2): qty 1

## 2024-08-03 MED ORDER — LIDOCAINE 5 % EX PTCH
1.0000 | MEDICATED_PATCH | CUTANEOUS | Status: DC
  Administered 2024-08-03 – 2024-08-05 (×3): 1 via TRANSDERMAL
  Filled 2024-08-03 (×3): qty 1

## 2024-08-03 NOTE — Progress Notes (Signed)
 PROGRESS NOTE    [name]  FMW:[id number] DOB: 07-12-1936 DOA: 07/31/2024 PCP: Julian Norleen BROCKS, MD    Brief Narrative:  [name] is a 88 y.o. male with past medical history of hypertension, CAD s/p CABG and PCI, CHF, paroxysmal atrial fibrillation, CKD stage IIIb, AAA s/p endovascular repair complicated by endovascular leak and subsequent embolization, and GERD presents after having a fall at home.    Assessment and Plan:  L2 endplate fracture with paraspinal hematoma secondary to fall Patient reports having frequent falls despite using walker.  After most recent fall found to have a minimally displaced triangular fracture of the anterior superior L2 with extension of the fracture into L2 superior endplate with 3.5 x 6 cm right paraspinal hematoma present.  Neurosurgery had been consulted and recommended bracing for comfort, pain control, and PT/OT to eval and treat. - Continue pain control and PT/OT evaluations - Hydrocodone /lidocaine  patches - PT eval: Home health - MRI pending of cervical spine per neurosurgery   AAA Patient's status post 2 separate interventions for endoleak repair which still persist.  Aneurysm noted to measure 7.7 cm.  Vascular surgery evaluated and recommended outpatient follow-up with radiology and no inpatient treatment necessary at this time. - Recommend outpatient follow-up with radiology as recommended by vascular surgery   Paroxysmal atrial fibrillation CHA2DS2-VASc score equal to at least 6.  Not on anticoagulation due to previous concerns with hematuria.  Cardiology abdominal pain and noted that he had previously been evaluated by Dr. Kennyth for consideration for Watchman procedure.    Heart failure with mildly reduced ejection fraction Noted to be euvolemic on physical exam.  Last echocardiogram noted EF to be 45 to 50% with global hypokinesis and mildly reduced RV systolic function. - Strict I&O's and daily weights   Coronary artery  disease Hyperlipidemia Patient with prior CABG back in 1997. - Resume Ranexa , Zetia  as recommended by cardiology   Essential hypertension -Norvasc  and Diovan  as recommended by cardiology   Normocytic anemia Hemoglobin noted to trend 10-> 9.5-> 9.3.  Patient appears to be hemodynamically stable at this time -trend   Diabetes mellitus type 2, without long-term use of insulin  Patient is not on any medications for treatment.  Last hemoglobin A1c noted to be 5.7 when checked 02/29/2024.  Patient was placed on a moderate sliding scale of insulin , but not requiring insulin  at this time.  - Advance diet to a carb modified diet   Chronic kidney disease stage IIIb Creatinine noted to be 1.55 which appears to be around patient's baseline.   BPH - Continue finasteride    Thalia - Continue Protonix   DVT prophylaxis: Place and maintain sequential compression device Start: 08/02/24 1745 SCDs Start: 08/01/24 0037    Code Status: Full Code Family Communication:   Disposition Plan:  Level of care: Med-Surg Status is: Inpatient       Subjective: Reporting of 10 out of 10 pain in his neck  Objective: Vitals:   08/02/24 1725 08/02/24 2205 08/03/24 0518 08/03/24 1057  BP: (!) 141/67 131/65 136/77 118/64  Pulse: (!) 55 61 63 77  Resp: 18 18 16 17   Temp: 98.9 F (37.2 C)   (!) 97.4 F (36.3 C)  TempSrc:      SpO2: 100% 99% 100% 100%    Intake/Output Summary (Last 24 hours) at 08/03/2024 1222 Last data filed at 08/03/2024 1221 Gross per 24 hour  Intake --  Output 900 ml  Net -900 ml   There were no vitals  filed for this visit.  Examination:   General: Appearance:     Overweight male in no acute distress     Lungs:     Clear to auscultation bilaterally, respirations unlabored  Heart:    Normal heart rate.   MS:   All extremities are intact.    Neurologic:   Awake, alert, oriented x 3. No apparent focal neurological           defect.        Data Reviewed: I have  personally reviewed following labs and imaging studies  CBC: Recent Labs  Lab 07/31/24 1558 08/01/24 0439 08/02/24 0353 08/03/24 0411  WBC 7.3 6.8 6.8 5.8  NEUTROABS 5.1  --   --   --   HGB 10.0* 9.5* 9.3* 9.1*  HCT 30.3* 28.4* 27.5* 27.2*  MCV 94.7 95.3 94.5 94.8  PLT 202 204 200 236   Basic Metabolic Panel: Recent Labs  Lab 07/31/24 1558 08/01/24 0439 08/02/24 0353 08/03/24 0411  NA 137 138 137 137  K 4.3 4.7 3.9 3.7  CL 102 105 101 103  CO2 23 24 24 24   GLUCOSE 118* 103* 109* 94  BUN 29* 24* 22 20  CREATININE 1.73* 1.61* 1.55* 1.54*  CALCIUM  9.7 9.3 9.1 8.9   GFR: CrCl cannot be calculated (Unknown ideal weight.). Liver Function Tests: Recent Labs  Lab 07/31/24 1558  AST 23  ALT 13  ALKPHOS 39  BILITOT 1.2  PROT 6.5  ALBUMIN 3.7   No results for input(s): LIPASE, AMYLASE in the last 168 hours. No results for input(s): AMMONIA in the last 168 hours. Coagulation Profile: No results for input(s): INR, PROTIME in the last 168 hours. Cardiac Enzymes: No results for input(s): CKTOTAL, CKMB, CKMBINDEX, TROPONINI in the last 168 hours. BNP (last 3 results) No results for input(s): PROBNP in the last 8760 hours. HbA1C: No results for input(s): HGBA1C in the last 72 hours. CBG: Recent Labs  Lab 08/02/24 1206 08/02/24 1657 08/02/24 2203 08/03/24 0818 08/03/24 1209  GLUCAP 108* 149* 83 94 118*   Lipid Profile: No results for input(s): CHOL, HDL, LDLCALC, TRIG, CHOLHDL, LDLDIRECT in the last 72 hours. Thyroid Function Tests: No results for input(s): TSH, T4TOTAL, FREET4, T3FREE, THYROIDAB in the last 72 hours. Anemia Panel: No results for input(s): VITAMINB12, FOLATE, FERRITIN, TIBC, IRON, RETICCTPCT in the last 72 hours. Sepsis Labs: No results for input(s): PROCALCITON, LATICACIDVEN in the last 168 hours.  No results found for this or any previous visit (from the past 240 hours).        Radiology Studies: MR CERVICAL SPINE WO CONTRAST Result Date: 08/03/2024 EXAM: MRI CERVICAL SPINE WITHOUT CONTRAST 08/03/2024 10:32:43 AM TECHNIQUE: Multiplanar multisequence MRI of the cervical spine was performed without the administration of intravenous contrast. COMPARISON: Cervical spine MRI 03/30/2022. CLINICAL HISTORY: Ataxia, nontraumatic, cervical pathology suspected. FINDINGS: BONES AND ALIGNMENT: Anterior fusion hardware at the C4-C7 levels with interbody spacers at C4-C5 and C6-C7. Posterior instrumented fusion at C6-C7 with posterior decompression. Normal vertebral body heights. Marrow signal is unremarkable. SPINAL CORD: Normal spinal cord size. Normal spinal cord signal. Chronic microhemorrhage in the left cerebellum. SOFT TISSUES: No paraspinal mass. C2-C3: Small disc bulge with uncovertebral spurring and mild right foraminal stenosis. No spinal canal stenosis. C3-C4: Intermediate sized disc bulge with slight worsening of severe spinal canal stenosis. Bilateral facet hypertrophy. No neural foraminal stenosis. C4-C5: Anterior fusion with interbody spacer. No spinal canal stenosis. Mild left foraminal stenosis. C5-C6: Anterior fusion. No central spinal canal  or neural foraminal stenosis. C6-C7: Anterior and posterior fusion with posterior decompression. Widely patent spinal canal. No neural foraminal stenosis. C7-T1: No significant disc herniation. No spinal canal stenosis or neural foraminal narrowing. IMPRESSION: 1. Postsurgical changes of C4-c7 fusion without residual spinal canal stenosis at these levels. 2. 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 3. Chronic microhemorrhage in the left cerebellum. Electronically signed by: Franky Stanford MD 08/03/2024 10:48 AM EDT RP Workstation: [id number]        Scheduled Meds:  amLODipine   5 mg Oral Daily   docusate sodium   100 mg Oral BID   ezetimibe   10 mg Oral Daily   fenofibrate    160 mg Oral Daily   finasteride   5 mg Oral Daily   irbesartan   300 mg Oral Daily   And   hydrochlorothiazide   12.5 mg Oral Daily   lidocaine   1 patch Transdermal Q24H   methocarbamol   500 mg Oral Q8H   Or   methocarbamol  (ROBAXIN ) injection  500 mg Intravenous Q8H   pantoprazole   40 mg Oral Daily   ranolazine   500 mg Oral Daily   Continuous Infusions:   LOS: 2 days    Time spent: 45 minutes spent on chart review, discussion with nursing staff, consultants, updating family and interview/physical exam; more than 50% of that time was spent in counseling and/or coordination of care.    Harlene RAYMOND Bowl, DO [facility] Available via Epic secure chat 7am-7pm After these hours, please refer to coverage provider listed on [email or web address] 08/03/2024, 12:22 PM

## 2024-08-03 NOTE — Progress Notes (Signed)
 Patient ID: [name], male   DOB: 04-29-1936, 88 y.o.   MRN: [id number] Because of his frequent falls and his previous cervical spine surgery and the suggestion of adjacent level stenosis at C3-4, I have ordered an MRI of the cervical spine to rule out cord compression above his previous fusion surgeries

## 2024-08-03 NOTE — TOC Progression Note (Signed)
 Transition of Care [facility]) - Progression Note    Patient Details  Name: [name] MRN: [id number] Date of Birth: 08-18-1936  Transition of Care [facility]) CM/SW Contact  Carmelita FORBES Carbon, LCSW Phone Number: 08/03/2024, 11:08 AM  Clinical Narrative:    LVM for spouse [name] Cherry requesting follow up. Per Patient Cort, patient had [facility] in the past. CSW spoke with Burnard with [facility] who states they can accept patient for HHPT and OT if needed.   Expected Discharge Plan: Home w Home Health Services Barriers to Discharge: Continued Medical Work up               Expected Discharge Plan and Services   Discharge Planning Services: CM Consult Post Acute Care Choice: Home Health Living arrangements for the past 2 months: Single Family Home                                       Social Drivers of Health (SDOH) Interventions SDOH Screenings   Food Insecurity: No Food Insecurity (08/01/2024)  Housing: Low Risk  (08/01/2024)  Transportation Needs: No Transportation Needs (08/01/2024)  Utilities: Not At Risk (08/01/2024)  Depression (PHQ2-9): Low Risk  (02/29/2024)  Social Connections: Socially Integrated (08/01/2024)  Tobacco Use: Medium Risk (08/01/2024)    Readmission Risk Interventions     No data to display

## 2024-08-03 NOTE — Progress Notes (Signed)
 Physical Therapy Treatment Patient Details Name: [name] MRN: [id number] DOB: 08/30/1936 Today's Date: 08/03/2024   History of Present Illness 88 y.o. male presents to [facility] hospital on 07/31/2024 after a recent fall with persistent back pain. CT scan shows a small chip fx at the L2 superior endplate, also paraspinal hematoma. Cervical CT 10/30 revealed severe stenosis C3-4. PMH includes asthma, CAD, CHF, DM, diverticulosis, HLD, GERD, HTN, h/o ACDF and lumbar fusion    PT Comments  Pt required min assist bed mobility, CGA transfers, and CGA amb 100' with RW. Increased gait distance as compared to previous session. Pt in recliner with feet elevated at end of session. Current POC remains appropriate.     If plan is discharge home, recommend the following: A little help with walking and/or transfers;A little help with bathing/dressing/bathroom;Assistance with cooking/housework;Assist for transportation;Help with stairs or ramp for entrance   Can travel by private vehicle        Equipment Recommendations  None recommended by PT    Recommendations for Other Services       Precautions / Restrictions Precautions Precautions: Fall;Back Recall of Precautions/Restrictions: Intact Required Braces or Orthoses: Spinal Brace Spinal Brace: Lumbar corset Restrictions Other Position/Activity Restrictions: brace for comfort. Pt opted not to wear 10/31/     Mobility  Bed Mobility Overal bed mobility: Needs Assistance Bed Mobility: Rolling, Sidelying to Sit Rolling: Modified independent (Device/Increase time), Used rails Sidelying to sit: Min assist, Used rails       General bed mobility comments: increased time, cues for sequencing    Transfers Overall transfer level: Needs assistance Equipment used: Rolling walker (2 wheels) Transfers: Sit to/from Stand Sit to Stand: Contact guard assist, From elevated surface           General transfer comment: increased time     Ambulation/Gait Ambulation/Gait assistance: Contact guard assist Gait Distance (Feet): 100 Feet Assistive device: Rolling walker (2 wheels) Gait Pattern/deviations: Step-through pattern, Decreased stride length Gait velocity: decreased Gait velocity interpretation: <1.8 ft/sec, indicate of risk for recurrent falls   General Gait Details: steady gait with RW   Stairs             Wheelchair Mobility     Tilt Bed    Modified Rankin (Stroke Patients Only)       Balance Overall balance assessment: Needs assistance Sitting-balance support: No upper extremity supported, Feet supported Sitting balance-Leahy Scale: Fair     Standing balance support: Bilateral upper extremity supported, During functional activity, Reliant on assistive device for balance Standing balance-Leahy Scale: Poor                              Communication Communication Communication: No apparent difficulties  Cognition Arousal: Alert Behavior During Therapy: WFL for tasks assessed/performed   PT - Cognitive impairments: No apparent impairments                         Following commands: Intact      Cueing Cueing Techniques: Verbal cues  Exercises      General Comments        Pertinent Vitals/Pain Pain Assessment Pain Assessment: Faces Faces Pain Scale: Hurts even more Pain Location: neck Pain Descriptors / Indicators: Discomfort, Sore Pain Intervention(s): Monitored during session, Repositioned    Home Living  Prior Function            PT Goals (current goals can now be found in the care plan section) Acute Rehab PT Goals Patient Stated Goal: decrease pain Progress towards PT goals: Progressing toward goals    Frequency    Min 2X/week      PT Plan      Co-evaluation              AM-PAC PT 6 Clicks Mobility   Outcome Measure  Help needed turning from your back to your side while in a flat bed  without using bedrails?: A Little Help needed moving from lying on your back to sitting on the side of a flat bed without using bedrails?: A Little Help needed moving to and from a bed to a chair (including a wheelchair)?: A Little Help needed standing up from a chair using your arms (e.g., wheelchair or bedside chair)?: A Little Help needed to walk in hospital room?: A Little Help needed climbing 3-5 steps with a railing? : A Lot 6 Click Score: 17    End of Session Equipment Utilized During Treatment: Gait belt Activity Tolerance: Patient tolerated treatment well Patient left: in chair;with call bell/phone within reach Nurse Communication: Mobility status PT Visit Diagnosis: Other abnormalities of gait and mobility (R26.89);Muscle weakness (generalized) (M62.81);Pain     Time: 1031-1050 PT Time Calculation (min) (ACUTE ONLY): 19 min  Charges:    $Gait Training: 8-22 mins PT General Charges $$ ACUTE PT VISIT: 1 Visit                     Sari MATSU., PT  Office # [phone]    Erven Sari Shaker 08/03/2024, 11:47 AM

## 2024-08-03 NOTE — Plan of Care (Signed)
   Problem: Education: Goal: Knowledge of General Education information will improve Description: Including pain rating scale, medication(s)/side effects and non-pharmacologic comfort measures Outcome: Progressing   Problem: Activity: Goal: Risk for activity intolerance will decrease Outcome: Progressing   Problem: Nutrition: Goal: Adequate nutrition will be maintained Outcome: Progressing   Problem: Coping: Goal: Level of anxiety will decrease Outcome: Progressing

## 2024-08-03 NOTE — TOC Progression Note (Signed)
 Transition of Care [facility]) - Progression Note    Patient Details  Name: [name] MRN: [id number] Date of Birth: [birth year]  [facility] CM/SW Contact  Estee Yohe, Mliss HERO, RN Phone Number: 08/03/2024, 3:54 PM  Clinical Narrative:    Spoke with patient's wife, [name], per patient request, regarding HH choice.  She states that they would like to use [facility], as they have used in the past.  Notified Kelly, admissions liaison with [facility] agency; she will follow to coordinate services post discharge.    Expected Discharge Plan: Home w Home Health Services Barriers to Discharge: Continued Medical Work up               Expected Discharge Plan and Services   Discharge Planning Services: CM Consult Post Acute Care Choice: Home Health Living arrangements for the past 2 months: Single Family Home                           HH Arranged: PT, OT HH Agency: [facility] Date [facility] Agency Contacted: 08/03/24 Time HH Agency Contacted: 1548 Representative spoke with at [facility] Agency: [name]   Social Drivers of Health (SDOH) Interventions SDOH Screenings   Food Insecurity: No Food Insecurity (08/01/2024)  Housing: Low Risk  (08/01/2024)  Transportation Needs: No Transportation Needs (08/01/2024)  Utilities: Not At Risk (08/01/2024)  Depression (PHQ2-9): Low Risk  (02/29/2024)  Social Connections: Socially Integrated (08/01/2024)  Tobacco Use: Medium Risk (08/01/2024)    Readmission Risk Interventions     No data to display         Mliss MICAEL Fass, RN, BSN  Trauma/Neuro ICU Case Manager [phone]

## 2024-08-03 NOTE — Progress Notes (Signed)
 Progress Note     Subjective: Patient reports continued pain of his back. Pain regimen has been adjusted to achieve better pain control. Patient urinating well. Had BM yesterday with flatulence. Has not tried carb modified diet yet. Denies dizziness, nausea, vomiting, chest pain, or SOB.   ROS  All negative with the exception of above.  Objective: Vital signs in last 24 hours: Temp:  [97.8 F (36.6 C)-98.9 F (37.2 C)] 98.9 F (37.2 C) (10/30 1725) Pulse Rate:  [55-68] 63 (10/31 0518) Resp:  [16-18] 16 (10/31 0518) BP: (126-141)/(65-77) 136/77 (10/31 0518) SpO2:  [99 %-100 %] 100 % (10/31 0518) Last BM Date : 07/31/24  Intake/Output from previous day: 10/30 0701 - 10/31 0700 In: 120 [P.O.:120] Out: 500 [Urine:500] Intake/Output this shift: No intake/output data recorded.  PE: General: Pleasant male who is laying in bed in NAD. HEENT: Head is normocephalic. Right periorbital area with ecchymosis (Improving). Sclera are noninjected.  EOMI. Ears and nose without any masses or lesions.  Mouth is pink and moist Heart: HR normal during encounter. Palpable radial and pedal pulses bilaterally. Lungs: CTAB, no wheezes, rhonchi, or rales noted. Respiratory effort nonlabored. MS: All 4 extremities are symmetrical with no cyanosis, clubbing, or edema.  Skin: Warm and dry. Abrasions noted of bilateral upper extremities. Left knee with abrasion.  Neuro: Speech is normal. Responds to questioning appropriately. Psych: A&Ox3 with an appropriate affect.   Lab Results:  Recent Labs    08/02/24 0353 08/03/24 0411  WBC 6.8 5.8  HGB 9.3* 9.1*  HCT 27.5* 27.2*  PLT 200 236   BMET Recent Labs    08/02/24 0353 08/03/24 0411  NA 137 137  K 3.9 3.7  CL 101 103  CO2 24 24  GLUCOSE 109* 94  BUN 22 20  CREATININE 1.55* 1.54*  CALCIUM  9.1 8.9   PT/INR No results for input(s): LABPROT, INR in the last 72 hours. CMP     Component Value Date/Time   NA 137 08/03/2024 0411    NA 141 02/29/2024 1517   K 3.7 08/03/2024 0411   CL 103 08/03/2024 0411   CO2 24 08/03/2024 0411   GLUCOSE 94 08/03/2024 0411   BUN 20 08/03/2024 0411   BUN 21 02/29/2024 1517   CREATININE 1.54 (H) 08/03/2024 0411   CREATININE 1.65 (H) 03/04/2016 0942   CALCIUM  8.9 08/03/2024 0411   PROT 6.5 07/31/2024 1558   PROT 6.3 02/29/2024 1517   ALBUMIN 3.7 07/31/2024 1558   ALBUMIN 3.9 02/29/2024 1517   AST 23 07/31/2024 1558   ALT 13 07/31/2024 1558   ALKPHOS 39 07/31/2024 1558   BILITOT 1.2 07/31/2024 1558   BILITOT 0.7 02/29/2024 1517   GFRNONAA 44 (L) 08/03/2024 0411   GFRAA 48 (L) 10/22/2020 1100   Lipase     Component Value Date/Time   LIPASE 31.0 05/21/2015 1018       Studies/Results: No results found.  Anti-infectives: Anti-infectives (From admission, onward)    None        Assessment/Plan 88 y/o M who experienced a mechanical fall and then had a lamp fall onto his back   L2 endplate fx with associated paraspinal hematoma  - Neurosurgery was consulted. Superior endplate fracture of L2 considered minor and should be stable. Recommended back brace, PT, OT, and pain control originally with serial x-rays. - MRI has been ordered today 10/31 by neurosurgery to rule out cord compression. - Patient still with pain especially with movement. Pain regimen has been adjusted  to help with pain control.   AAA s/p repair and embolization for endoleak  - Dr. Lucas called by EDP and reported this looks stable. Dr. Serene consulted and recommended outpatient follow-up with radiology. No inpatient treatment necessary.   -Really appreciate hospital teams consult and help with medical management.  CAD s/p CABG without angina:  -Cardiology consulted. Recommended resuming ASA, Ranexa , Norvasc , Zetia  and Diovan  medications.  -EKG 10/29. Paroxysmal atrial fibrillation -Not on anticoagulation due to previous concerns with hematuria.  - Considering resuming aspirin   Heart failure  with mildly reduced ejection fraction - Monitor I&Os.  Coronary artery disease Hyperlipidemia - Ranexa , Zetia  resumed and recommended by cardiology Essential hypertension Pressures noted to be elevated up to 145/69. - Resume Norvasc  and Diovan  as recommended by cardiology Normocytic anemia - WBC 5.8 and Hgb 9.1 - Monitoring H&H. Diabetes mellitus type 2, without long-term use of insulin  - Advance diet to a carb modified diet; Not needing insulin  at this time. Chronic kidney disease stage IIIb - Creatinine at patient's baseline. BPH - Finasteride  Gerd - Protonix      FEN - Carb modified diet VTE - SCDs ID - None Dispo- Pain control, Therapies.    LOS: 2 days   I reviewed consulting provider notes, specialist notes, nursing notes, last 24 h vitals and pain scores, last 48 h intake and output, last 24 h labs and trends, and last 24 h imaging results.  This care required moderate level of medical decision making.    Marjorie Carlyon Favre, [facility] 08/03/2024, 7:38 AM Please see Amion for pager number during day hours 7:00am-4:30pm

## 2024-08-03 NOTE — Progress Notes (Signed)
 Patient ID: [name], male   DOB: Mar 29, 1936, 88 y.o.   MRN: [id number] Subjective: Patient reports continued back pain with ambulation or lifting his legs.  Objective: Vital signs in last 24 hours: Temp:  [97.4 F (36.3 C)-98.9 F (37.2 C)] 97.4 F (36.3 C) (10/31 1057) Pulse Rate:  [55-77] 77 (10/31 1057) Resp:  [16-18] 17 (10/31 1057) BP: (118-141)/(64-77) 118/64 (10/31 1057) SpO2:  [99 %-100 %] 100 % (10/31 1057)  Intake/Output from previous day: 10/30 0701 - 10/31 0700 In: 120 [P.O.:120] Out: 500 [Urine:500] Intake/Output this shift: Total I/O In: -  Out: 400 [Urine:400]  Neurologic: Grossly normal  Lab Results: Lab Results  Component Value Date   WBC 5.8 08/03/2024   HGB 9.1 (L) 08/03/2024   HCT 27.2 (L) 08/03/2024   MCV 94.8 08/03/2024   PLT 236 08/03/2024   Lab Results  Component Value Date   INR 1.1 12/29/2022   BMET Lab Results  Component Value Date   NA 137 08/03/2024   K 3.7 08/03/2024   CL 103 08/03/2024   CO2 24 08/03/2024   GLUCOSE 94 08/03/2024   BUN 20 08/03/2024   CREATININE 1.54 (H) 08/03/2024   CALCIUM  8.9 08/03/2024    Studies/Results: MR CERVICAL SPINE WO CONTRAST Result Date: 08/03/2024 EXAM: MRI CERVICAL SPINE WITHOUT CONTRAST 08/03/2024 10:32:43 AM TECHNIQUE: Multiplanar multisequence MRI of the cervical spine was performed without the administration of intravenous contrast. COMPARISON: Cervical spine MRI 03/30/2022. CLINICAL HISTORY: Ataxia, nontraumatic, cervical pathology suspected. FINDINGS: BONES AND ALIGNMENT: Anterior fusion hardware at the C4-C7 levels with interbody spacers at C4-C5 and C6-C7. Posterior instrumented fusion at C6-C7 with posterior decompression. Normal vertebral body heights. Marrow signal is unremarkable. SPINAL CORD: Normal spinal cord size. Normal spinal cord signal. Chronic microhemorrhage in the left cerebellum. SOFT TISSUES: No paraspinal mass. C2-C3: Small disc bulge with uncovertebral spurring and  mild right foraminal stenosis. No spinal canal stenosis. C3-C4: Intermediate sized disc bulge with slight worsening of severe spinal canal stenosis. Bilateral facet hypertrophy. No neural foraminal stenosis. C4-C5: Anterior fusion with interbody spacer. No spinal canal stenosis. Mild left foraminal stenosis. C5-C6: Anterior fusion. No central spinal canal or neural foraminal stenosis. C6-C7: Anterior and posterior fusion with posterior decompression. Widely patent spinal canal. No neural foraminal stenosis. C7-T1: No significant disc herniation. No spinal canal stenosis or neural foraminal narrowing. IMPRESSION: 1. Postsurgical changes of C4-c7 fusion without residual spinal canal stenosis at these levels. 2. 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 3. Chronic microhemorrhage in the left cerebellum. Electronically signed by: Franky Stanford MD 08/03/2024 10:48 AM EDT RP Workstation: [id number]    Assessment/Plan: He does have spinal stenosis at C3-4 but no signal change in the spinal cord and no obvious myelopathy on exam.  I do not think that is the reason he keeps falling.  I will plan to see him 2 weeks after discharge in the office for follow-up.  Please call for any further questions.  Estimated body mass index is 28.7 kg/m as calculated from the following:   Height as of 04/17/24: 5' 9.5 (1.765 m).   Weight as of 04/17/24: 89.4 kg.    LOS: 2 days    Alm GORMAN Molt 08/03/2024, 2:37 PM

## 2024-08-04 DIAGNOSIS — T1490XA Injury, unspecified, initial encounter: Secondary | ICD-10-CM | POA: Diagnosis not present

## 2024-08-04 LAB — BASIC METABOLIC PANEL WITH GFR
Anion gap: 12 (ref 5–15)
BUN: 30 mg/dL — ABNORMAL HIGH (ref 8–23)
CO2: 22 mmol/L (ref 22–32)
Calcium: 9 mg/dL (ref 8.9–10.3)
Chloride: 102 mmol/L (ref 98–111)
Creatinine, Ser: 1.76 mg/dL — ABNORMAL HIGH (ref 0.61–1.24)
GFR, Estimated: 37 mL/min — ABNORMAL LOW (ref >60)
Glucose, Bld: 104 mg/dL — ABNORMAL HIGH (ref 70–99)
Potassium: 3.7 mmol/L (ref 3.5–5.1)
Sodium: 136 mmol/L (ref 135–145)

## 2024-08-04 LAB — CBC
HCT: 28 % — ABNORMAL LOW (ref 39.0–52.0)
Hemoglobin: 9.2 g/dL — ABNORMAL LOW (ref 13.0–17.0)
MCH: 31.1 pg (ref 26.0–34.0)
MCHC: 32.9 g/dL (ref 30.0–36.0)
MCV: 94.6 fL (ref 80.0–100.0)
Platelets: 262 K/uL (ref 150–400)
RBC: 2.96 MIL/uL — ABNORMAL LOW (ref 4.22–5.81)
RDW: 15 % (ref 11.5–15.5)
WBC: 6.7 K/uL (ref 4.0–10.5)
nRBC: 0 % (ref 0.0–0.2)

## 2024-08-04 LAB — GLUCOSE, CAPILLARY
Glucose-Capillary: 97 mg/dL (ref 70–99)
Glucose-Capillary: 103 mg/dL — ABNORMAL HIGH (ref 70–99)
Glucose-Capillary: 155 mg/dL — ABNORMAL HIGH (ref 70–99)
Glucose-Capillary: 127 mg/dL — ABNORMAL HIGH (ref 70–99)

## 2024-08-04 NOTE — Progress Notes (Signed)
 Occupational Therapy Treatment Patient Details Name: [name] MRN: [id number] DOB: [birth year] Today's Date: 08/04/2024   History of present illness 88 y.o. male presents to [facility] hospital on 07/31/2024 after a recent fall with persistent back pain. CT scan shows a small chip fx at the L2 superior endplate, also paraspinal hematoma. Cervical CT 10/30 revealed severe stenosis C3-4. PMH includes asthma, CAD, CHF, DM, diverticulosis, HLD, GERD, HTN, h/o ACDF and lumbar fusion   OT comments  Pt. Seen for skilled OT treatment session.  Pt. Wearing brace upon arrival and reports he is able to don brace without assistance.  Reports spouse assists with LB ADLs and declined need for A/E or practice with this task.  Sit/stand min/mod a with increased effort to achieve full standing.  Heavy cues during ambulation for RW management and sequencing for safe stand/sit.  Pt. Able to recall 2/3 back precautions with cues for no arching.  Additional education provided on implementation of back precautions during functional mobility and ADL completion.  Cont. With acute OT POC.        If plan is discharge home, recommend the following:  A little help with walking and/or transfers;A lot of help with bathing/dressing/bathroom;Assistance with cooking/housework;Assist for transportation;Help with stairs or ramp for entrance   Equipment Recommendations  None recommended by OT    Recommendations for Other Services      Precautions / Restrictions Precautions Precautions: Fall;Back Recall of Precautions/Restrictions: Intact Required Braces or Orthoses: Spinal Brace Spinal Brace: Lumbar corset Restrictions Other Position/Activity Restrictions: brace on upon arrival       Mobility Bed Mobility               General bed mobility comments: recliner at beg/end of session    Transfers Overall transfer level: Needs assistance Equipment used: Rolling walker (2 wheels) Transfers: Sit to/from Stand, Bed  to chair/wheelchair/BSC Sit to Stand: Min assist, Mod assist     Step pivot transfers: Min assist     General transfer comment: increased time, slow transition from sit/stand with heavy support required from recliner.  max cues to finish backing up before sitting down and to reach back for arm rests.  also during ambulation rw very far in front of pt. heavy cues to stay inside rw and head up, promoting improved posture to aide in fall prevention     Balance                                           ADL either performed or assessed with clinical judgement   ADL Overall ADL's : Needs assistance/impaired               Lower Body Bathing Details (indicate cue type and reason): reports wife assists prn, no a/e needs       Lower Body Dressing Details (indicate cue type and reason): reports wife assists prn, no a/e needs Toilet Transfer: Minimal assistance;Ambulation;Rolling walker (2 wheels);Grab bars Toilet Transfer Details (indicate cue type and reason): cues for rw managment and sequencing   Toileting - Clothing Manipulation Details (indicate cue type and reason): reviewed methods to ensure back precautions maintained     Functional mobility during ADLs: Minimal assistance;Rolling walker (2 wheels) General ADL Comments: Pt requiring assist at baseline, heavy cues to stay inside the rw, and to reach surface before sitting, reach back for surface before attempting to sit down  Extremity/Trunk Assessment              Vision       Restaurant [employer] Communication: No apparent difficulties   Cognition Arousal: Alert Behavior During Therapy: WFL for tasks assessed/performed Cognition: No apparent impairments                               Following commands: Intact        Cueing   Cueing Techniques: Verbal cues  Exercises      Shoulder Instructions       General Comments       Pertinent Vitals/ Pain       Pain Assessment Pain Assessment: No/denies pain  Home Living                                          Prior Functioning/Environment              Frequency  Min 2X/week        Progress Toward Goals  OT Goals(current goals can now be found in the care plan section)  Progress towards OT goals: Progressing toward goals     Plan      Co-evaluation                 AM-PAC OT 6 Clicks Daily Activity     Outcome Measure   Help from another person eating meals?: None Help from another person taking care of personal grooming?: A Little Help from another person toileting, which includes using toliet, bedpan, or urinal?: A Lot Help from another person bathing (including washing, rinsing, drying)?: A Lot Help from another person to put on and taking off regular upper body clothing?: A Little Help from another person to put on and taking off regular lower body clothing?: A Lot 6 Click Score: 16    End of Session Equipment Utilized During Treatment: Rolling walker (2 wheels);Back brace  OT Visit Diagnosis: Unsteadiness on feet (R26.81);Repeated falls (R29.6);Muscle weakness (generalized) (M62.81);History of falling (Z91.81);Pain   Activity Tolerance Patient tolerated treatment well   Patient Left in chair;with call bell/phone within reach;with chair alarm set   Nurse Communication Other (comment) (rn states ok to work with pt.)        Time: 1256-1305 OT Time Calculation (min): 9 min  Charges: OT General Charges $OT Visit: 1 Visit OT Treatments $Self Care/Home Management : 8-22 mins  Randall, COTA/L Acute Rehabilitation [phone]   CHRISTELLA Nest Lorraine-COTA/L  08/04/2024, 2:14 PM

## 2024-08-04 NOTE — Plan of Care (Signed)
  Problem: Clinical Measurements: Goal: Will remain free from infection Outcome: Progressing   Problem: Coping: Goal: Level of anxiety will decrease Outcome: Progressing   Problem: Elimination: Goal: Will not experience complications related to urinary retention Outcome: Progressing   Problem: Pain Managment: Goal: General experience of comfort will improve and/or be controlled Outcome: Progressing   Problem: Safety: Goal: Ability to remain free from injury will improve Outcome: Progressing

## 2024-08-04 NOTE — Progress Notes (Signed)
 Mobility Specialist Progress Note;   08/04/24 1230  Mobility  Activity Ambulated with assistance;Pivoted/transferred from bed to chair  Level of Assistance Minimal assist, patient does 75% or more  Assistive Device Front wheel walker  Distance Ambulated (ft) 80 ft  Activity Response Tolerated well  Mobility Referral Yes  Mobility visit 1 Mobility  Mobility Specialist Start Time (ACUTE ONLY) 1230  Mobility Specialist Stop Time (ACUTE ONLY) 1240  Mobility Specialist Time Calculation (min) (ACUTE ONLY) 10 min   Pt received ambulating in hallway w/ NT, agreeable to hand over to mobility. Required MinA to stand from chair in hallway and safely ambulate in hallway w/ chair follow for safety. No c/o when asked. Took 1x seated rest break. Returned back to room and left with all needs met, alarm on.   Lauraine Erm Mobility Specialist Please contact via SecureChat or [employer] [phone]

## 2024-08-04 NOTE — Progress Notes (Signed)
 PROGRESS NOTE    [name]  FMW:[id number] DOB: November 05, 1935 DOA: 07/31/2024 PCP: Joyce Norleen BROCKS, MD    Brief Narrative:  [name] is a 88 y.o. male with past medical history of hypertension, CAD s/p CABG and PCI, CHF, paroxysmal atrial fibrillation, CKD stage IIIb, AAA s/p endovascular repair complicated by endovascular leak and subsequent embolization, and GERD presents after having a fall at home.    Assessment and Plan:  L2 endplate fracture with paraspinal hematoma secondary to fall Patient reports having frequent falls despite using walker.  After most recent fall found to have a minimally displaced triangular fracture of the anterior superior L2 with extension of the fracture into L2 superior endplate with 3.5 x 6 cm right paraspinal hematoma present.  Neurosurgery had been consulted and recommended bracing for comfort, pain control, and PT/OT to eval and treat. - Continue pain control and PT/OT evaluations - Hydrocodone /lidocaine  patches - PT eval: Home health - MRI pending of cervical spine per neurosurgery   AAA Patient's status post 2 separate interventions for endoleak repair which still persist.  Aneurysm noted to measure 7.7 cm.  Vascular surgery evaluated and recommended outpatient follow-up with radiology and no inpatient treatment necessary at this time. - Recommend outpatient follow-up with radiology as recommended by vascular surgery   Paroxysmal atrial fibrillation CHA2DS2-VASc score equal to at least 6.  Not on anticoagulation due to previous concerns with hematuria.  Cardiology abdominal pain and noted that he had previously been evaluated by Dr. Kennyth for consideration for Watchman procedure.    Heart failure with mildly reduced ejection fraction Noted to be euvolemic on physical exam.  Last echocardiogram noted EF to be 45 to 50% with global hypokinesis and mildly reduced RV systolic function. - Strict I&O's and daily weights   Coronary artery  disease Hyperlipidemia Patient with prior CABG back in 1997. - Resume Ranexa , Zetia  as recommended by cardiology   Essential hypertension -Norvasc  and Diovan  as recommended by cardiology   Normocytic anemia Hemoglobin noted to trend 10-> 9.5-> 9.3.  Patient appears to be hemodynamically stable at this time -trend   Diabetes mellitus type 2, without long-term use of insulin  Patient is not on any medications for treatment.  Last hemoglobin A1c noted to be 5.7 when checked 02/29/2024.  Patient was placed on a moderate sliding scale of insulin , but not requiring insulin  at this time.  - Advance diet to a carb modified diet   Chronic kidney disease stage IIIb Creatinine noted to be 1.55 which appears to be around patient's baseline.   BPH - Continue finasteride    Thalia - Continue Protonix   DVT prophylaxis: Place and maintain sequential compression device Start: 08/02/24 1745 SCDs Start: 08/01/24 0037    Code Status: Full Code Family Communication:   Disposition Plan:  Level of care: Med-Surg Status is: Inpatient       Subjective: Reporting of 10 out of 10 pain in his neck  Objective: Vitals:   08/03/24 1057 08/03/24 1828 08/03/24 2013 08/04/24 1000  BP: 118/64 (!) 142/62 127/63 136/77  Pulse: 77 74 73 88  Resp: 17 17 18 18   Temp: (!) 97.4 F (36.3 C) 98.4 F (36.9 C) 97.6 F (36.4 C) (!) 97.5 F (36.4 C)  TempSrc:   Oral   SpO2: 100% 100% 100% 99%    Intake/Output Summary (Last 24 hours) at 08/04/2024 1147 Last data filed at 08/04/2024 1100 Gross per 24 hour  Intake 360 ml  Output 1000 ml  Net -640  ml   There were no vitals filed for this visit.  Examination:   General: Appearance:     Overweight male in no acute distress     Lungs:     Clear to auscultation bilaterally, respirations unlabored  Heart:    Normal heart rate.   MS:   All extremities are intact.    Neurologic:   Awake, alert       Data Reviewed: I have personally reviewed following  labs and imaging studies  CBC: Recent Labs  Lab 07/31/24 1558 08/01/24 0439 08/02/24 0353 08/03/24 0411 08/04/24 0542  WBC 7.3 6.8 6.8 5.8 6.7  NEUTROABS 5.1  --   --   --   --   HGB 10.0* 9.5* 9.3* 9.1* 9.2*  HCT 30.3* 28.4* 27.5* 27.2* 28.0*  MCV 94.7 95.3 94.5 94.8 94.6  PLT 202 204 200 236 262   Basic Metabolic Panel: Recent Labs  Lab 07/31/24 1558 08/01/24 0439 08/02/24 0353 08/03/24 0411 08/04/24 0542  NA 137 138 137 137 136  K 4.3 4.7 3.9 3.7 3.7  CL 102 105 101 103 102  CO2 23 24 24 24 22   GLUCOSE 118* 103* 109* 94 104*  BUN 29* 24* 22 20 30*  CREATININE 1.73* 1.61* 1.55* 1.54* 1.76*  CALCIUM  9.7 9.3 9.1 8.9 9.0   GFR: CrCl cannot be calculated (Unknown ideal weight.). Liver Function Tests: Recent Labs  Lab 07/31/24 1558  AST 23  ALT 13  ALKPHOS 39  BILITOT 1.2  PROT 6.5  ALBUMIN 3.7   No results for input(s): LIPASE, AMYLASE in the last 168 hours. No results for input(s): AMMONIA in the last 168 hours. Coagulation Profile: No results for input(s): INR, PROTIME in the last 168 hours. Cardiac Enzymes: No results for input(s): CKTOTAL, CKMB, CKMBINDEX, TROPONINI in the last 168 hours. BNP (last 3 results) No results for input(s): PROBNP in the last 8760 hours. HbA1C: No results for input(s): HGBA1C in the last 72 hours. CBG: Recent Labs  Lab 08/03/24 1209 08/03/24 1659 08/03/24 2014 08/04/24 0805 08/04/24 1145  GLUCAP 118* 124* 140* 97 103*   Lipid Profile: No results for input(s): CHOL, HDL, LDLCALC, TRIG, CHOLHDL, LDLDIRECT in the last 72 hours. Thyroid Function Tests: No results for input(s): TSH, T4TOTAL, FREET4, T3FREE, THYROIDAB in the last 72 hours. Anemia Panel: No results for input(s): VITAMINB12, FOLATE, FERRITIN, TIBC, IRON, RETICCTPCT in the last 72 hours. Sepsis Labs: No results for input(s): PROCALCITON, LATICACIDVEN in the last 168 hours.  No results found for  this or any previous visit (from the past 240 hours).       Radiology Studies: MR CERVICAL SPINE WO CONTRAST Result Date: 08/03/2024 EXAM: MRI CERVICAL SPINE WITHOUT CONTRAST 08/03/2024 10:32:43 AM TECHNIQUE: Multiplanar multisequence MRI of the cervical spine was performed without the administration of intravenous contrast. COMPARISON: Cervical spine MRI 03/30/2022. CLINICAL HISTORY: Ataxia, nontraumatic, cervical pathology suspected. FINDINGS: BONES AND ALIGNMENT: Anterior fusion hardware at the C4-C7 levels with interbody spacers at C4-C5 and C6-C7. Posterior instrumented fusion at C6-C7 with posterior decompression. Normal vertebral body heights. Marrow signal is unremarkable. SPINAL CORD: Normal spinal cord size. Normal spinal cord signal. Chronic microhemorrhage in the left cerebellum. SOFT TISSUES: No paraspinal mass. C2-C3: Small disc bulge with uncovertebral spurring and mild right foraminal stenosis. No spinal canal stenosis. C3-C4: Intermediate sized disc bulge with slight worsening of severe spinal canal stenosis. Bilateral facet hypertrophy. No neural foraminal stenosis. C4-C5: Anterior fusion with interbody spacer. No spinal canal stenosis. Mild left foraminal  stenosis. C5-C6: Anterior fusion. No central spinal canal or neural foraminal stenosis. C6-C7: Anterior and posterior fusion with posterior decompression. Widely patent spinal canal. No neural foraminal stenosis. C7-T1: No significant disc herniation. No spinal canal stenosis or neural foraminal narrowing. IMPRESSION: 1. Postsurgical changes of C4-c7 fusion without residual spinal canal stenosis at these levels. 2. 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 3. Chronic microhemorrhage in the left cerebellum. Electronically signed by: Franky Stanford MD 08/03/2024 10:48 AM EDT RP Workstation: [id number]        Scheduled Meds:  amLODipine   5 mg Oral Daily   docusate sodium   100 mg  Oral BID   ezetimibe   10 mg Oral Daily   fenofibrate   160 mg Oral Daily   finasteride   5 mg Oral Daily   irbesartan   300 mg Oral Daily   lidocaine   1 patch Transdermal Q24H   methocarbamol   500 mg Oral Q8H   Or   methocarbamol  (ROBAXIN ) injection  500 mg Intravenous Q8H   pantoprazole   40 mg Oral Daily   ranolazine   500 mg Oral Daily   Continuous Infusions:   LOS: 3 days    Time spent: 45 minutes spent on chart review, discussion with nursing staff, consultants, updating family and interview/physical exam; more than 50% of that time was spent in counseling and/or coordination of care.    Harlene RAYMOND Bowl, DO [facility] Available via Epic secure chat 7am-7pm After these hours, please refer to coverage provider listed on [email or web address] 08/04/2024, 11:47 AM

## 2024-08-04 NOTE — Progress Notes (Signed)
 PT Cancellation Note  Patient Details Name: [name] MRN: [id number] DOB: 16-Nov-1935   Cancelled Treatment:    Reason Eval/Treat Not Completed: (P) Fatigue/lethargy limiting ability to participate (pt just got back to bed with NT assist ~5 mins prior to PTA attempt.) Pt reports ambulating in hallway with staff assist earlier in the day. He is agreeable to working with mobility team and nursing to ambulate tomorrow. Will continue efforts per PT plan of care as schedule permits, most likely again on Monday, pt agreeable.   Connell HERO Toryn Dewalt 08/04/2024, 4:57 PM

## 2024-08-04 NOTE — Plan of Care (Signed)
   Problem: Education: Goal: Knowledge of General Education information will improve Description: Including pain rating scale, medication(s)/side effects and non-pharmacologic comfort measures Outcome: Progressing   Problem: Activity: Goal: Risk for activity intolerance will decrease Outcome: Progressing   Problem: Nutrition: Goal: Adequate nutrition will be maintained Outcome: Progressing

## 2024-08-04 NOTE — Progress Notes (Signed)
 Progress Note     Subjective: Stable and well controlled back pain. No n/t. No recent falls. He is tolerating po without n/v. BM yesterday. Voiding.    Objective: Vital signs in last 24 hours: Temp:  [97.4 F (36.3 C)-98.4 F (36.9 C)] 97.6 F (36.4 C) (10/31 2013) Pulse Rate:  [73-77] 73 (10/31 2013) Resp:  [17-18] 18 (10/31 2013) BP: (118-142)/(62-64) 127/63 (10/31 2013) SpO2:  [100 %] 100 % (10/31 2013) Last BM Date : 07/31/24  Intake/Output from previous day: 10/31 0701 - 11/01 0700 In: 240 [P.O.:240] Out: 500 [Urine:500] Intake/Output this shift: No intake/output data recorded.  PE: General: Pleasant male who is laying in bed in NAD. HEENT: Head is normocephalic. Right periorbital area with ecchymosis (Improving). Sclera are noninjected.  EOMI. Ears and nose without any masses or lesions.  Mouth is pink and moist Heart: HR normal during encounter.  Lungs: CTAB, no wheezes, rhonchi, or rales noted. Respiratory effort nonlabored. MS: No LE edema.  Skin: Warm and dry. Abrasions noted of bilateral upper extremities Neuro: Speech is normal. Responds to questioning appropriately. SILT to BUE and BLE. MAE's Psych: A&Ox3 with an appropriate affect.   Lab Results:  Recent Labs    08/03/24 0411 08/04/24 0542  WBC 5.8 6.7  HGB 9.1* 9.2*  HCT 27.2* 28.0*  PLT 236 262   BMET Recent Labs    08/03/24 0411 08/04/24 0542  NA 137 136  K 3.7 3.7  CL 103 102  CO2 24 22  GLUCOSE 94 104*  BUN 20 30*  CREATININE 1.54* 1.76*  CALCIUM  8.9 9.0   PT/INR No results for input(s): LABPROT, INR in the last 72 hours. CMP     Component Value Date/Time   NA 136 08/04/2024 0542   NA 141 02/29/2024 1517   K 3.7 08/04/2024 0542   CL 102 08/04/2024 0542   CO2 22 08/04/2024 0542   GLUCOSE 104 (H) 08/04/2024 0542   BUN 30 (H) 08/04/2024 0542   BUN 21 02/29/2024 1517   CREATININE 1.76 (H) 08/04/2024 0542   CREATININE 1.65 (H) 03/04/2016 0942   CALCIUM  9.0 08/04/2024  0542   PROT 6.5 07/31/2024 1558   PROT 6.3 02/29/2024 1517   ALBUMIN 3.7 07/31/2024 1558   ALBUMIN 3.9 02/29/2024 1517   AST 23 07/31/2024 1558   ALT 13 07/31/2024 1558   ALKPHOS 39 07/31/2024 1558   BILITOT 1.2 07/31/2024 1558   BILITOT 0.7 02/29/2024 1517   GFRNONAA 37 (L) 08/04/2024 0542   GFRAA 48 (L) 10/22/2020 1100   Lipase     Component Value Date/Time   LIPASE 31.0 05/21/2015 1018       Studies/Results: MR CERVICAL SPINE WO CONTRAST Result Date: 08/03/2024 EXAM: MRI CERVICAL SPINE WITHOUT CONTRAST 08/03/2024 10:32:43 AM TECHNIQUE: Multiplanar multisequence MRI of the cervical spine was performed without the administration of intravenous contrast. COMPARISON: Cervical spine MRI 03/30/2022. CLINICAL HISTORY: Ataxia, nontraumatic, cervical pathology suspected. FINDINGS: BONES AND ALIGNMENT: Anterior fusion hardware at the C4-C7 levels with interbody spacers at C4-C5 and C6-C7. Posterior instrumented fusion at C6-C7 with posterior decompression. Normal vertebral body heights. Marrow signal is unremarkable. SPINAL CORD: Normal spinal cord size. Normal spinal cord signal. Chronic microhemorrhage in the left cerebellum. SOFT TISSUES: No paraspinal mass. C2-C3: Small disc bulge with uncovertebral spurring and mild right foraminal stenosis. No spinal canal stenosis. C3-C4: Intermediate sized disc bulge with slight worsening of severe spinal canal stenosis. Bilateral facet hypertrophy. No neural foraminal stenosis. C4-C5: Anterior fusion with interbody spacer.  No spinal canal stenosis. Mild left foraminal stenosis. C5-C6: Anterior fusion. No central spinal canal or neural foraminal stenosis. C6-C7: Anterior and posterior fusion with posterior decompression. Widely patent spinal canal. No neural foraminal stenosis. C7-T1: No significant disc herniation. No spinal canal stenosis or neural foraminal narrowing. IMPRESSION: 1. Postsurgical changes of C4-c7 fusion without residual spinal canal  stenosis at these levels. 2. 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 3. Chronic microhemorrhage in the left cerebellum. Electronically signed by: Franky Stanford MD 08/03/2024 10:48 AM EDT RP Workstation: [id number]    Anti-infectives: Anti-infectives (From admission, onward)    None        Assessment/Plan 88 y/o M who experienced a mechanical fall and then had a lamp fall onto his back   L2 endplate fx with associated paraspinal hematoma  - Neurosurgery was consulted. Superior endplate fracture of L2 considered minor and should be stable. Recommended back brace, PT, OT, and pain control originally with serial x-rays. - Patient still with pain especially with movement. Pain regimen has been adjusted to help with pain control.   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 Per Neuro note. No signal change in the spinal cord and no obvious myelopathy on exam. Plan f/u outpatient.   AAA s/p repair and embolization for endoleak  - Dr. Lucas called by EDP and reported this looks stable. Dr. Serene consulted and recommended outpatient follow-up with radiology. No inpatient treatment necessary.   -Really appreciate hospital teams consult and help with medical management.  CAD s/p CABG without angina:  -Cardiology consulted. Recommended resuming ASA, Ranexa , Norvasc , Zetia  and Diovan  medications.  -EKG 10/29. Paroxysmal atrial fibrillation -Not on anticoagulation due to previous concerns with hematuria.  - Considering resuming aspirin   Heart failure with mildly reduced ejection fraction - Monitor I&Os.  Coronary artery disease Hyperlipidemia - Ranexa , Zetia  resumed and recommended by cardiology Essential hypertension - Resume Norvasc  and Diovan  as recommended by cardiology Normocytic anemia - Monitoring H&H. Diabetes mellitus type 2, without long-term use of insulin  - Advance diet to a carb modified diet; Not needing insulin  at this  time. Chronic kidney disease stage IIIb - Creatinine at patient's baseline. BPH - Finasteride  Gerd - Protonix    FEN - Carb modified diet VTE - SCDs ID - None Dispo- Therapies today. Possible d/c tomorrow.     LOS: 3 days   I reviewed consulting provider notes, specialist notes, nursing notes, last 24 h vitals and pain scores, last 48 h intake and output, last 24 h labs and trends, and last 24 h imaging results.  This care required moderate level of medical decision making.    Ozell CHRISTELLA Shaper, [facility] Surgery 08/04/2024, 10:12 AM Please see Amion for pager number during day hours 7:00am-4:30pm

## 2024-08-05 LAB — CBC
HCT: 28.8 % — ABNORMAL LOW (ref 39.0–52.0)
Hemoglobin: 9.7 g/dL — ABNORMAL LOW (ref 13.0–17.0)
MCH: 31.6 pg (ref 26.0–34.0)
MCHC: 33.7 g/dL (ref 30.0–36.0)
MCV: 93.8 fL (ref 80.0–100.0)
Platelets: 282 K/uL (ref 150–400)
RBC: 3.07 MIL/uL — ABNORMAL LOW (ref 4.22–5.81)
RDW: 14.8 % (ref 11.5–15.5)
WBC: 6.6 K/uL (ref 4.0–10.5)
nRBC: 0 % (ref 0.0–0.2)

## 2024-08-05 LAB — BASIC METABOLIC PANEL WITH GFR
Anion gap: 11 (ref 5–15)
BUN: 33 mg/dL — ABNORMAL HIGH (ref 8–23)
CO2: 23 mmol/L (ref 22–32)
Calcium: 9.3 mg/dL (ref 8.9–10.3)
Chloride: 102 mmol/L (ref 98–111)
Creatinine, Ser: 1.69 mg/dL — ABNORMAL HIGH (ref 0.61–1.24)
GFR, Estimated: 39 mL/min — ABNORMAL LOW (ref >60)
Glucose, Bld: 98 mg/dL (ref 70–99)
Potassium: 3.9 mmol/L (ref 3.5–5.1)
Sodium: 136 mmol/L (ref 135–145)

## 2024-08-05 LAB — GLUCOSE, CAPILLARY
Glucose-Capillary: 118 mg/dL — ABNORMAL HIGH (ref 70–99)
Glucose-Capillary: 105 mg/dL — ABNORMAL HIGH (ref 70–99)

## 2024-08-05 MED ORDER — POLYETHYLENE GLYCOL 3350 17 G PO PACK: 17.0000 g | PACK | Freq: Two times a day (BID) | ORAL | Status: AC | PRN

## 2024-08-05 MED ORDER — METHOCARBAMOL 500 MG PO TABS: 500.0000 mg | ORAL_TABLET | Freq: Three times a day (TID) | ORAL | 0 refills | Status: AC | PRN

## 2024-08-05 MED ORDER — RANOLAZINE ER 500 MG PO TB12: 500.0000 mg | ORAL_TABLET | Freq: Every day | ORAL | Status: AC

## 2024-08-05 MED ORDER — DOCUSATE SODIUM 100 MG PO CAPS: 100.0000 mg | ORAL_CAPSULE | Freq: Two times a day (BID) | ORAL | Status: AC | PRN

## 2024-08-05 MED ORDER — HYDROCODONE-ACETAMINOPHEN 5-325 MG PO TABS: 1.0000 | ORAL_TABLET | Freq: Four times a day (QID) | ORAL | 0 refills | Status: AC | PRN

## 2024-08-05 MED ORDER — ACETAMINOPHEN 325 MG PO TABS: 650.0000 mg | ORAL_TABLET | Freq: Four times a day (QID) | ORAL | Status: AC | PRN

## 2024-08-05 MED ORDER — LIDOCAINE 5 % EX PTCH: 1.0000 | MEDICATED_PATCH | CUTANEOUS | 0 refills | Status: AC

## 2024-08-05 NOTE — Progress Notes (Signed)
 Discharge Nurse Summary: DC order noted per MD. DC RN at bedside with patient. Patient agreeable with discharge plan, states family will arrive soon for pickup. Wife returned to bedside shortly after. AVS printed/reviewed with wife. PIV removed, skin intact. No DME needs. No home/TOC meds. CP/Edu resolved. Telemonitor not present on assessment. All belongings accounted for. Patient wheeled downstairs for discharge by private auto.   Rosario EMERSON Lund, RN

## 2024-08-05 NOTE — Progress Notes (Signed)
 Home med rec done. Home health orders placed Ok to d/c from [facility] standpoint Harlene Bowl DO

## 2024-08-05 NOTE — Progress Notes (Incomplete)
 [name] to be D/C'd  per MD order.  Discussed with the patient and all questions fully answered.  VSS, Skin clean, dry and intact without evidence of skin break down, no evidence of skin tears noted.  IV catheter discontinued intact. Site without signs and symptoms of complications.   An After Visit Summary was printed and given to the patient.   D/c education completed with patient/family including follow up instructions, medication list, d/c activities limitations if indicated, with other d/c instructions as indicated by MD - patient able to verbalize understanding, all questions fully answered.   Patient instructed to return to ED, call 911, or call MD for any changes in condition.   Patient to be escorted via WC, and D/C home via private auto with wife.

## 2024-08-05 NOTE — Plan of Care (Signed)
   Problem: Education: Goal: Knowledge of General Education information will improve Description: Including pain rating scale, medication(s)/side effects and non-pharmacologic comfort measures Outcome: Progressing   Problem: Activity: Goal: Risk for activity intolerance will decrease Outcome: Progressing   Problem: Nutrition: Goal: Adequate nutrition will be maintained Outcome: Progressing   Problem: Coping: Goal: Level of anxiety will decrease Outcome: Progressing

## 2024-08-05 NOTE — Discharge Summary (Signed)
 Patient ID: [name] [id number] [birth year] 88 y.o.  Admit date: 07/31/2024 Discharge date: 08/05/2024  Admitting Diagnosis: 88 y/o M who experienced a mechanical fall and then had a lamp fall onto his back L2 endplate fx with associated paraspinal hematoma  AAA s/p repair and embolization for endoleak   Discharge Diagnosis 87 y/o M who experienced a mechanical fall and then had a lamp fall onto his back L2 endplate fx with associated paraspinal hematoma  Spinal stenosis at C3-4  AAA s/p repair and embolization for endoleak  CAD s/p CABG without angina:  Paroxysmal atrial fibrillation Heart failure with mildly reduced ejection fraction Coronary artery disease Hyperlipidemia Essential hypertension Normocytic anemia Diabetes mellitus type 2, without long-term use of insulin  Chronic kidney disease stage IIIb BPH Gerd  Consultants Vascular NSGY TRH  HPI: 88 y/o M w/ a hx of CAD s/p CABG and PCI (only on ASA), AAA s/p endovascular repair c/b endoleak s/p coil embolization, CHF, and DM who presented to the ED after she sustained a mechanical fall 4 days ago. He reports that he tripped and landed near a rocking chair and then had a lamp fall on his back. He did not seek treatment initially but over the past few days the pain has become unbearable.   CT imaging was performed and shows displaced fx of the superior endplate of L2, R paraspinal hematoma from L2-L5  Procedures None  Hospital Course:  88 y/o M who experienced a mechanical fall and was found to have below injuries.    L2 endplate fx with associated paraspinal hematoma  - Neurosurgery was consulted. Superior endplate fracture of L2 considered minor and should be stable. Recommended back brace for comfort. Patient worked with PT and OT. Treated with pain control - Patient still with pain especially with movement. Pain regimen has been adjusted to help with pain control.   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 MRI done. Per NSGY note - No signal change in the spinal cord and no obvious myelopathy on exam. Plan f/u outpatient.    AAA s/p repair and embolization for endoleak  - CTA w/  7.7 cm infrarenal abdominal aortic aneurysm status post endovascular stent graft repair. The excluded aneurysm sac has increased in size since the prior CT (previously measuring approximately 7 cm in diameter). Dr. Serene consulted and recommended outpatient follow-up with radiology. No inpatient treatment necessary.   - TRH consulted to help with medical management of the following -  CAD s/p CABG without angina - Cardiology consulted. Recommended resuming ASA, Ranexa , Norvasc , Zetia  and Diovan  medications. EKG done 10/29. Paroxysmal atrial fibrillation - Not on anticoagulation due to previous concerns with hematuria.  Heart failure with mildly reduced ejection fraction - Monitored with strict I&Os.  Coronary artery disease Hyperlipidemia - Ranexa , Zetia  resumed and recommended by cardiology Essential hypertension - Resume Norvasc  and Diovan  as recommended by cardiology Normocytic anemia - Hgb stable on last check prior to discharge Diabetes mellitus type 2, without long-term use of insulin  - Placed on CM diet Chronic kidney disease stage IIIb - Creatinine at patient's baseline. BPH - Finasteride  [place] - Protonix   Patient worked with therapies and was recommended for [facility] PT/OT. CM consulted for assistance in arranging [facility]. On 11/2 the patient was voiding well, tolerating diet, ambulating well, pain well controlled, vital signs stable and felt stable for discharge home. Follow up as noted below.    Physical Exam: Gen:  Alert, NAD, pleasant Card:  Reg  Pulm:  CTAB, no W/R/R, effort normal Abd: Soft, ND, NT  Ext:  No LE edema. MAE's Psych: A&Ox3   Allergies as of 08/05/2024       Reactions   Roxicodone  [oxycodone ] Shortness Of Breath, Cough   Statins Other (See Comments)   Myalgias    Zestril [lisinopril]  Cough   Eliquis  [apixaban ] Other (See Comments)   Blood clots in urine   Welchol [colesevelam Hcl] Itching        Medication List     TAKE these medications    acetaminophen  325 MG tablet Commonly known as: TYLENOL  Take 2 tablets (650 mg total) by mouth every 6 (six) hours as needed for mild pain (pain score 1-3). What changed:  medication strength how much to take when to take this reasons to take this additional instructions   amLODipine  5 MG tablet Commonly known as: NORVASC  Take 1 tablet by mouth once daily   aspirin  EC 81 MG tablet Take 81 mg by mouth daily.   docusate sodium  100 MG capsule Commonly known as: COLACE Take 1 capsule (100 mg total) by mouth 2 (two) times daily as needed for mild constipation.   ezetimibe  10 MG tablet Commonly known as: ZETIA  Take 1 tablet by mouth once daily   Fenofibric Acid  135 MG Cpdr Take 1 tablet by mouth daily.   ferrous sulfate  325 (65 FE) MG tablet Take 325 mg by mouth daily.   finasteride  5 MG tablet Commonly known as: PROSCAR  Take 1 tablet (5 mg total) by mouth daily.   fluticasone -salmeterol 100-50 MCG/ACT Aepb Commonly known as: ADVAIR Inhale 1 puff into the lungs 2 (two) times daily. What changed:  when to take this reasons to take this   HYDROcodone -acetaminophen  5-325 MG tablet Commonly known as: NORCO/VICODIN Take 1 tablet by mouth every 6 (six) hours as needed for severe pain (pain score 7-10).   lidocaine  5 % Commonly known as: LIDODERM  Place 1 patch onto the skin daily. Remove & Discard patch within 12 hours or as directed by MD   methocarbamol  500 MG tablet Commonly known as: ROBAXIN  Take 1 tablet (500 mg total) by mouth every 8 (eight) hours as needed for muscle spasms. What changed:  when to take this reasons to take this   pantoprazole  40 MG tablet Commonly known as: PROTONIX  Take 1 tablet by mouth once daily   polyethylene glycol 17 g packet Commonly known as: MIRALAX  /  GLYCOLAX  Take 17 g by mouth 2 (two) times daily as needed (constipation).   PreserVision AREDS 2+Multi Vit Caps Take 1 capsule by mouth 2 (two) times daily.   ranolazine  500 MG 12 hr tablet Commonly known as: RANEXA  Take 1 tablet (500 mg total) by mouth daily.   valsartan -hydrochlorothiazide  320-12.5 MG tablet Commonly known as: DIOVAN -HCT Take 1 tablet by mouth once daily          Follow-up Information     Joshua Alm Hamilton, MD. Schedule an appointment as soon as possible for a visit in 2 week(s).   Specialty: Neurosurgery Why: For your back fracture Contact information: [address] [place] [address] [phone]         [facility] Follow up.   Specialty: Home Health Services Why: Physical therapy. Office will call to arrange follow up after hospital discharge. Contact information: [address] [place] [address] [phone]         [facility] Follow up.   Why: For follow up  of your AAA Contact information: [address] [place]  [address]        Joyce Norleen BROCKS, MD Follow up.   Specialty: Family Medicine Why: For follow up Contact information: [address] [place] [address] [phone]                 Signed: Ozell CHRISTELLA Shaper, [facility] Surgery 08/05/2024, 11:40 AM Please see Amion for pager number during day hours 7:00am-4:30pm

## 2024-08-05 NOTE — TOC Transition Note (Signed)
 Transition of Care [facility]) - Discharge Note   Patient Details  Name: [name] MRN: [id number] Date of Birth: [birth year]  Transition of [facility]) CM/SW Contact:  Robynn Eileen Hoose, RN Phone Number: 08/05/2024, 10:42 AM   Clinical Narrative:   Secure message from provider regarding home health arrangement. Message to [facility] with [facility] to check on status.  Katina accepted pt for [facility] services.      Barriers to Discharge: Continued Medical Work up   Patient Goals and CMS Choice   CMS Medicare.gov Compare Post Acute Care list provided to:: Patient Represenative (must comment) Choice offered to / list presented to : Spouse      Discharge Placement                       Discharge Plan and Services Additional resources added to the After Visit Summary for     Discharge Planning Services: CM Consult Post Acute Care Choice: Home Health                    HH Arranged: PT, OT [facility] Agency: [facility] Date [facility] Agency Contacted: 08/03/24 Time HH Agency Contacted: 1548 Representative spoke with at [facility] Agency: [name]  Social Drivers of Health (SDOH) Interventions SDOH Screenings   Food Insecurity: No Food Insecurity (08/01/2024)  Housing: Low Risk  (08/01/2024)  Transportation Needs: No Transportation Needs (08/01/2024)  Utilities: Not At Risk (08/01/2024)  Depression (PHQ2-9): Low Risk  (02/29/2024)  Social Connections: Socially Integrated (08/01/2024)  Tobacco Use: Medium Risk (08/01/2024)     Readmission Risk Interventions     No data to display

## 2024-08-07 ENCOUNTER — Other Ambulatory Visit: Payer: Self-pay | Admitting: Cardiology

## 2024-08-08 ENCOUNTER — Ambulatory Visit: Admitting: Family Medicine

## 2024-08-08 ENCOUNTER — Encounter: Payer: Self-pay | Admitting: Family Medicine

## 2024-08-08 VITALS — BP 110/66 | HR 84 | Ht 69.0 in | Wt 190.0 lb

## 2024-08-08 DIAGNOSIS — Z23 Encounter for immunization: Secondary | ICD-10-CM | POA: Diagnosis not present

## 2024-08-08 DIAGNOSIS — Z87448 Personal history of other diseases of urinary system: Secondary | ICD-10-CM | POA: Diagnosis not present

## 2024-08-08 DIAGNOSIS — Z9889 Other specified postprocedural states: Secondary | ICD-10-CM | POA: Diagnosis not present

## 2024-08-08 DIAGNOSIS — Z8679 Personal history of other diseases of the circulatory system: Secondary | ICD-10-CM

## 2024-08-08 DIAGNOSIS — S32020A Wedge compression fracture of second lumbar vertebra, initial encounter for closed fracture: Secondary | ICD-10-CM

## 2024-08-08 DIAGNOSIS — H3530 Unspecified macular degeneration: Secondary | ICD-10-CM

## 2024-08-08 DIAGNOSIS — I2119 ST elevation (STEMI) myocardial infarction involving other coronary artery of inferior wall: Secondary | ICD-10-CM

## 2024-08-08 NOTE — Progress Notes (Signed)
   Subjective:    Patient ID: [name], male    DOB: 1936/03/23, 88 y.o.   MRN: [id number]  Discussed the use of AI scribe software for clinical note transcription with the patient, who gave verbal consent to proceed.  History of Present Illness   [name] is an 88 year old male with a history of abdominal aortic aneurysm repair who presents with back pain following a mechanical fall.  He experienced a mechanical fall, landing on a concrete floor, resulting in a fracture in the L2 area of his lower back. He has significant pain in the lower back region and is experiencing difficulty with mobility, requiring assistance with activities such as using the urinal. He is currently taking hydrocodone  and a muscle relaxer for pain management, with varying effectiveness.  During the emergency room visit, imaging studies revealed that his previously repaired abdominal aortic aneurysm sac has increased in size since prior imaging. He is scheduled for follow-up with a vascular surgeon, although the appointment has not yet been made.  He is managing macular degeneration, for which he has received one injection and is scheduled to follow up with an ophthalmologist. Additionally, he has a history of prostate issues, including previous bleeding and clotting, and is under the care of a urologist. He is currently taking finasteride  for prostate management.  His medication regimen includes hydrocodone  as needed for pain, a muscle relaxer, and aspirin  81 mg.           Review of Systems     Objective:    Physical Exam Alert and in no distress otherwise not examined           Assessment & Plan:     Lumbar vertebral fracture after mechanical fall with pain management and rehabilitation Lumbar vertebral fracture at L2 with partial pain relief from hydrocodone  and muscle relaxers. Neurosurgery advised back brace. Rehabilitation ongoing with physical and occupational therapy. - Continue  hydrocodone  and muscle relaxers as needed. - Use Tylenol  up to 3 grams per day. - Follow-up with Doctor Joshua on November 13th. - Coordinate with [facility] for home physical and occupational therapy.  Abdominal aortic aneurysm, post endovascular repair, enlarging excluded sac Post endovascular repair with enlarging excluded sac. Follow-up with vascular surgeon required. - Follow-up with Dr. Serene for evaluation.  Age-related macular degeneration, wet type Wet age-related macular degeneration with prior injection treatment. Follow-up scheduled. - Follow-up with Dr. Elner on November 11th.  Coronary artery disease, post coronary artery bypass graft Coronary artery disease post-bypass graft. General follow-up scheduled. - Follow-up with Dr. Anner on November 18th.  Benign prostatic hyperplasia, on finasteride  Benign prostatic hyperplasia managed with finasteride . Previous bleeding episodes evaluated, no cancer found. Follow-up scheduled. - Follow-up with Dr. Myrna on November 25th.

## 2024-08-09 ENCOUNTER — Telehealth: Payer: Self-pay | Admitting: Cardiology

## 2024-08-09 ENCOUNTER — Other Ambulatory Visit: Payer: Self-pay | Admitting: Nurse Practitioner

## 2024-08-09 MED ORDER — EZETIMIBE 10 MG PO TABS
10.0000 mg | ORAL_TABLET | Freq: Every day | ORAL | 3 refills | Status: AC
Start: 2024-08-09 — End: ?

## 2024-08-09 MED ORDER — PANTOPRAZOLE SODIUM 40 MG PO TBEC
40.0000 mg | DELAYED_RELEASE_TABLET | Freq: Every day | ORAL | 3 refills | Status: AC
Start: 2024-08-09 — End: ?

## 2024-08-09 NOTE — Telephone Encounter (Signed)
 Refill for Zetia  and Pantoprazole  have been sent.  Pt's wife, [name], [place] on file, has been made aware that the Fenofibirc Acid is already at [facility] with refills.

## 2024-08-09 NOTE — Telephone Encounter (Signed)
*  STAT* If patient is at the pharmacy, call can be transferred to refill team.   1. Which medications need to be refilled? (please list name of each medication and dose if known)   pantoprazole  (PROTONIX ) 40 MG tablet  ezetimibe  (ZETIA ) 10 MG tablet  Choline Fenofibrate  (FENOFIBRIC ACID ) 135 MG CPDR   2. Would you like to learn more about the convenience, safety, & potential cost savings by using the [facility]?   3. Are you open to using the [facility] (Type [facility]. ).  4. Which pharmacy/location (including street and city if local pharmacy) is medication to be sent to?  [id number] - [place] ([place]), [place] - [address]   5. Do they need a 30 day or 90 day supply?   90 day  Wife (Dorothy) stated patient is completely out of these medications.  Patient has appointment scheduled with A. Duke on 11/18.

## 2024-08-14 LAB — OPHTHALMOLOGY REPORT-SCANNED

## 2024-08-21 ENCOUNTER — Ambulatory Visit: Admitting: Physician Assistant

## 2024-08-27 ENCOUNTER — Other Ambulatory Visit: Payer: Self-pay | Admitting: Cardiology

## 2024-08-27 DIAGNOSIS — I5032 Chronic diastolic (congestive) heart failure: Secondary | ICD-10-CM

## 2024-08-28 ENCOUNTER — Ambulatory Visit: Admitting: Urology

## 2024-08-28 ENCOUNTER — Encounter: Payer: Self-pay | Admitting: Urology

## 2024-08-28 VITALS — BP 146/78 | HR 76

## 2024-08-28 DIAGNOSIS — N401 Enlarged prostate with lower urinary tract symptoms: Secondary | ICD-10-CM

## 2024-08-28 DIAGNOSIS — R310 Gross hematuria: Secondary | ICD-10-CM | POA: Diagnosis not present

## 2024-08-28 LAB — MICROSCOPIC EXAMINATION

## 2024-08-28 LAB — URINALYSIS, ROUTINE W REFLEX MICROSCOPIC
Bilirubin, UA: NEGATIVE
Glucose, UA: NEGATIVE
Ketones, UA: NEGATIVE
Leukocytes,UA: NEGATIVE
Nitrite, UA: NEGATIVE
Protein,UA: NEGATIVE
Specific Gravity, UA: 1.02 (ref 1.005–1.030)
Urobilinogen, Ur: 1 mg/dL (ref 0.2–1.0)
pH, UA: 5.5 (ref 5.0–7.5)

## 2024-08-28 NOTE — Progress Notes (Signed)
 Assessment: 1. Gross hematuria:  likely due to prostatic bleeding   2. Benign localized prostatic hyperplasia with lower urinary tract symptoms (LUTS)     Plan: Continue finasteride  5 mg daily.  Return to office in 6 months.  Chief Complaint: Chief Complaint  Patient presents with   Hematuria    HPI: [name] is a 88 y.o. male who presents for continued evaluation of gross hematuria.    He was initially seen by Dr. Shona on 11/09/23 with history as follows: 88 YO male with a past medical of diastolic CHF, asthma, GERD, CAD, STEMI, CABG, prior smoker, diabetes  who is seen in consultation from Joyce Norleen BROCKS, MD for evaluation of gross hematuria.  This began about 1 month ago and is present most days.  Was on Eliquis  which was recently discontinued not only because of the hematuria but because of balance issues and frequent falls.  He is taking Plavix  currently. Patient also has significant CKD with his creatinines varying from 1.45-2.09 over the past 2 years. CT stone study 06/2023 showed an atrophic right kidney otherwise negative from a GU standpoint. Patient could not void during visit today.  Bladder scan 289 mL Note is made that the patient held his Plavix  and his urine entirely cleared.   Renal ultrasound which showed a solid-appearing lesion in the left posterior bladder.  Cystoscopy from 11/15/23: Flexible cystoscopy revealed a normal anterior urethra.  The patient has a large prostate with a long friable prostatic urethra which bleeds easily with passage of scope especially near the bladder neck.  There is also some intravesical extension of the prostate.   The bladder was subsequently entered and carefully inspected.  This revealed severe bladder trabeculation with large cellule formation.  There was also a dependent clot in the left bladder floor which would move around quite easily with irrigation.  There were no focal mucosal lesions appreciated.  The ureteral  openings were not definitely visualized due to severe trabeculation.  Urine cytology was negative for malignancy.  He was started on finasteride  in February 2025.  At his visit in May 2025, he continued on finasteride .  He had not had any further episodes of gross hematuria.  His urinary symptoms remained stable with some frequency and urgency.  No dysuria or flank pain. IPSS = 8/2.  He returns today for follow-up.  He continues on finasteride .  No episodes of gross hematuria in the past 6 months.  He continues with some frequency and urgency.  No dysuria. IPSS = 7/1. He recently fell and required hospitalization.  He is currently in a back brace and is using a wheelchair and walker.  Portions of the above documentation were copied from a prior visit for review purposes only.  Allergies: Allergies  Allergen Reactions   Roxicodone  [Oxycodone ] Shortness Of Breath and Cough   Statins Other (See Comments)    Myalgias     Zestril [Lisinopril] Cough   Eliquis  [Apixaban ] Other (See Comments)    Blood clots in urine   Welchol [Colesevelam Hcl] Itching    PMH: Past Medical History:  Diagnosis Date   Adenomatous colon polyp    Ankle edema    Chronic   Asthma    CAD S/P percutaneous coronary angioplasty 2004   a) '04: Staged Taxus DES PCI to RCA (2 Taxus 2.75 x 32 & 12) and Cx-OM (Taxus 3 x 20);;'07 - PCI to pRCA ISR- Cypher DES; c) 05/2014: pPCI pRCA stentISR (Promus DES 3 x 8)  OM1 distal to stent (Promus DES 2.75 x 12 - 3.1); 11/'22: distal LCx stent edge 99% -> Onyx Frontier DES 2.5 x 15 - 2.8 (overlaps prior stent)   CAD, multiple vessel 1985   Most recent July 2018: Admitted for non-STEMI. Occluded LAD with patent LIMA (SP1 now occluded).  Patent stents in proximal and mid RCA as well as circumflex-OM 3. Known occlusion of SVG-OM. EF 45-50%   CHF (congestive heart failure) (HCC)    Cholelithiasis - with cholangitis & choledocholithiasis    status post ERCP with removal of calculi and  biliary stent placement.   Chronic anemia    On iron supplement; history of positive guaiac - negative colonoscopy in 1996.; Thought to be related to hemorrhoids; status post hemorrhoidectomy   Chronic back pain     multiple surgeries; C-spine and lumbar   Diabetes mellitus    no meds- pt states he is no longer DM   Diverticulitis of colon 1996   Diverticulosis    Dyslipidemia, goal LDL below 70    Erectile dysfunction    Exertional dyspnea    Chronic baseline SOB with ambulation   GERD (gastroesophageal reflux disease)    Grade II diastolic dysfunction    H/O: pneumonia 11/2012   Hemorrhoids    Hiatal hernia    History of: ST elevation myocardial infarction (STEMI) involving left circumflex coronary artery with complication 1985   PTCA-circumflex; PCI in 1991   Hypertension    Moderate aortic stenosis by prior echocardiogram 07/2015   Normal LV function - EF 55-60%.. Abnormal relaxation. Mild-moderate aortic stenosis (peak/mean gradient 18/10 mmH)   Osteoarthritis of both knees    And back; multiple back surgeries, right knee arthroplasty and left knee arthroscopic surgery x2   Pneumonia 2016   S/P AAA (abdominal aortic aneurysm) repair 08/30/2012   s/p EVAR   S/P CABG x 2 1997   LIMA-LAD, SVG-OM   Sleep apnea    pt. states he was told to return for f/u, to be fitted for Cpap, but pt. reports that he didn't follow up-no cpap used   Statin myopathy 09/30/2015    PSH: Past Surgical History:  Procedure Laterality Date   Abdominal and Lower Extremity Arterial Ultrasound  08/23/2012; 10/10/2013   Normal ABIs. Nonocclusive lower extremity disease. 4.2 cm x 4.3 cm infrarenal AAA;; 4.4 cm x 4.3 cm (essentially stable)    ABDOMINAL AORTIC ENDOVASCULAR STENT GRAFT N/A 08/13/2015   Procedure: ABDOMINAL AORTIC ENDOVASCULAR STENT GRAFT;  Surgeon: Lynwood JONETTA Collum, MD;  Location: [facility] OR;  Service: Vascular;  Laterality: N/A;   Anterior cervical plating  04/23/2010   At C4-5 and a C6-7  utilizing two separate Biomet MaxAn plates.   ANTERIOR LAT LUMBAR FUSION Left 11/22/2012   Procedure: ANTERIOR LATERAL LUMBAR FUSION 1 LEVEL;  Surgeon: Alm GORMAN Molt, MD;  Location: [facility] ORS;  Service: Neurosurgery;  Laterality: Left;  Anterior Lateral Lumbar Fusion Lumbar Three-Four   BACK SURGERY  1979 & x 10   pt. remarks, I have had about 10 back surgeries   BILIARY STENT PLACEMENT N/A 07/03/2014   Procedure: BILIARY STENT PLACEMENT;  Surgeon: Lamar JONETTA Aho, MD;  Location: [facility] ENDOSCOPY;  Service: Endoscopy;  Laterality: N/A;   CARPAL TUNNEL RELEASE Left 11/16/2019   Procedure: LEFT CARPAL TUNNEL RELEASE;  Surgeon: Vernetta Lonni GRADE, MD;  Location: [facility] ORS;  Service: Orthopedics;  Laterality: Left;   CARPAL TUNNEL RELEASE Right 01/10/2023   Procedure: RIGHT CARPAL TUNNEL RELEASE;  Surgeon: Molt Alm  S, MD;  Location: [facility] OR;  Service: Neurosurgery;  Laterality: Right;   CATARACT EXTRACTION     Cervical arthrodesis  04/23/2010   Anterior cervical arthrodesis, C4-5, C6-7 utilizing 7-mm PEEK interbody cage packed with local autograft & Antifuse putty at C4-5 & an 8-mm cage at C6-7.   CERVICAL DISCECTOMY  04/23/2010   Decompressive anterior carvical diskectomy. C4-5, C6-7   CHOLECYSTECTOMY N/A 05/23/2015   Procedure: LAPAROSCOPIC CHOLECYSTECTOMY WITH INTRAOPERATIVE CHOLANGIOGRAM;  Surgeon: Alm Angle, MD;  Location: [facility] ORS;  Service: General;  Laterality: N/A;   COLONOSCOPY  1996   CORONARY ANGIOPLASTY  1985,1991,15   1985 lateral STEMI Circumflex PTCA;    CORONARY ARTERY BYPASS GRAFT  1997   LIMA-LAD, SVG-OM (SVG known to be occluded prior to 2004)   CORONARY STENT INTERVENTION N/A 08/20/2021   Procedure: CORONARY STENT INTERVENTION;  Surgeon: Court Dorn PARAS, MD;  Location: [facility] INVASIVE CV LAB;  Service: Cardiovascular:  dLCx distal Edge 99% => Onyx Frontier DES 2.5 x 15 (2.8 mm) overlaps prior stent distally.   ERCP N/A 07/03/2014   Procedure: ENDOSCOPIC RETROGRADE  CHOLANGIOPANCREATOGRAPHY (ERCP);  Surgeon: Lamar JONETTA Aho, MD;  Location: [facility] ENDOSCOPY;  Service: Endoscopy;  Laterality: N/A;   ERCP N/A 07/05/2014   Procedure: ENDOSCOPIC RETROGRADE CHOLANGIOPANCREATOGRAPHY (ERCP);  Surgeon: Lamar JONETTA Aho, MD;  Location: [facility] ENDOSCOPY;  Service: Endoscopy;  Laterality: N/A;   ERCP N/A 06/30/2015   Procedure: ENDOSCOPIC RETROGRADE CHOLANGIOPANCREATOGRAPHY (ERCP);  Surgeon: Gwendlyn ONEIDA Buddy, MD;  Location: [facility];  Service: Endoscopy;  Laterality: N/A;   EYE SURGERY     IR ANGIOGRAM EXTREMITY LEFT  01/18/2017   IR ANGIOGRAM PELVIS SELECTIVE OR SUPRASELECTIVE  01/18/2017   IR ANGIOGRAM SELECTIVE EACH ADDITIONAL VESSEL  01/18/2017   IR AORTAGRAM ABDOMINAL SERIALOGRAM  01/18/2017   IR EMBO ARTERIAL NOT HEMORR HEMANG INC GUIDE ROADMAPPING  01/18/2017   IR RADIOLOGIST EVAL & MGMT  01/04/2017   IR RADIOLOGIST EVAL & MGMT  10/26/2017   IR RADIOLOGIST EVAL & MGMT  05/24/2018   IR RADIOLOGIST EVAL & MGMT  06/19/2019   IR RADIOLOGIST EVAL & MGMT  06/11/2020   IR RADIOLOGIST EVAL & MGMT  07/02/2021   IR RADIOLOGIST EVAL & MGMT  06/22/2022   IR RADIOLOGIST EVAL & MGMT  07/08/2023   IR US  GUIDE VASC ACCESS RIGHT  01/18/2017   KNEE ARTHROSCOPY Left    x 2   LEFT HEART CATH AND CORS/GRAFTS ANGIOGRAPHY  06/2003   None Occluded vein graft to OM; diffuse RCA disease in the mid vessel, 80% circumflex-OM stenosis; follow on AV groove circumflex with sequential 90% stenoses and intervening saccular dilation    LEFT HEART CATH AND CORS/GRAFTS ANGIOGRAPHY  12/2008   4 abnormal Myoview  showing apical thinning (possibly due to apical LAD 95%) : 100% Occluded LAD after SV1, distal LAD grafted via LIMA -apical 95% . Cx -OM1 w/patent stent extending into OM 1 . Follow on Cx - 70-80% - non-amenable PCI. RCA widely patent 3 overlapping stents in mRCA w/less than 40% stenosisin RPL; SVG-OM known occluded    LEFT HEART CATH AND CORS/GRAFTS ANGIOGRAPHY N/A 04/20/2017   Procedure:  Left Heart Cath and Cors/Grafts Angiography;  Surgeon: Court Dorn PARAS, MD;  Location: [facility] INVASIVE CV LAB;  LAD now occluded prior to SP1.LIMA-LAD. Patent RCA and circumflex stents. EF 45-50%. Relatively stable.    LEFT HEART CATH AND CORS/GRAFTS ANGIOGRAPHY N/A 08/20/2021   Procedure: LEFT HEART CATH AND CORS/GRAFTS ANGIOGRAPHY;  Surgeon: Court Dorn PARAS, MD;  Location:  [facility] INVASIVE CV LAB;  Service: Cardiovascular: CULPRIT 99% dLCx just after stent (Overlapping DES PCI) - otw patent LCx stent, patent RCA stent & Patent LIMA-LAD with CTO of prox LAD.   LEFT HEART CATHETERIZATION WITH CORONARY ANGIOGRAM N/A 05/15/2014   Procedure: LEFT HEART CATHETERIZATION WITH CORONARY ANGIOGRAM;  Surgeon: Victory LELON Claudene DOUGLAS, MD;  Location: [facility] CATH LAB;  Service: Cardiovascular;  Laterality: N/A;   LEFT HEART CATHETERIZATION WITH CORONARY/GRAFT ANGIOGRAM N/A 06/28/2014   Procedure: LEFT HEART CATHETERIZATION WITH EL BILE;  Surgeon: Debby DELENA Sor, MD;  Location: [facility] CATH LAB;  Service: Cardiovascular;  Laterality: N/A;   LUMBAR PERCUTANEOUS PEDICLE SCREW 1 LEVEL N/A 11/22/2012   Procedure: LUMBAR PERCUTANEOUS PEDICLE SCREW 1 LEVEL;  Surgeon: Alm GORMAN Molt, MD;  Location: [facility]O ORS;  Service: Neurosurgery;  Laterality: N/A;  Lumbar Three-Four Percutaneous Pedicle Screw, Lateral approach   MINOR HEMORRHOIDECTOMY     NM MYOVIEW  LTD  05/26/2021   Lexiscan : Intermediate risk (similar findings to last study), with exception of reduced EF roughly 40%..  Prior basal inferior-inferolateral infarct without peri-infarct ischemia.   ->  Echo ordered -> (EF 45 to 50%)-when compared to prior Myoview , infarct appears similar.   PERCUTANEOUS CORONARY STENT INTERVENTION (PCI-S)  06/2003   PCI - RCA 2 overlapping Taxus DES 2.75 mm x 32 mm and 2.75 mm x 12 mm (3.0 mm); PCI-Cx-OM1 - Taxus DES 3.0 mm x 20 mm (3.1 mm);    PERCUTANEOUS CORONARY STENT INTERVENTION (PCI-S)  12/2005   80% ISR in proximal Taxus stent in  RCA -- covered proximally with Cypher DES 3.0 mm x 12 mm   PERCUTANEOUS CORONARY STENT INTERVENTION (PCI-S) N/A 05/17/2014   Procedure: PERCUTANEOUS CORONARY STENT INTERVENTION (PCI-S);  Surgeon: Victory LELON Claudene DOUGLAS, MD;  Location: [facility] CATH LAB: PCI pRCA stent ISR - Promus DES 3.0 x 8 (3.25), OM distal stent edge - Promus DES 2.75 x 12 (3.1)   PERIPHERAL VASCULAR CATHETERIZATION  11/03/2016   Procedure: Embolization;  Surgeon: Penne Lonni Colorado, MD;  Location: [facility] INVASIVE CV LAB;  Service: Cardiovascular;;   POSTERIOR CERVICAL FUSION/FORAMINOTOMY N/A 05/02/2013   Procedure: POSTERIOR CERVICAL FUSION/FORAMINOTOMY CERVICAL SEVEN THORACIC-ONE;  Surgeon: Alm GORMAN Molt, MD;  Location: [facility] NEURO ORS;  Service: Neurosurgery;  Laterality: N/A;  POSTERIOR CERVICAL FUSION/FORAMINOTOMY CERVICAL SEVEN THORACIC-ONE   SPHINCTEROTOMY N/A 06/30/2015   Procedure: SPHINCTEROTOMY;  Surgeon: Gwendlyn ONEIDA Buddy, MD;  Location: [facility] ENDOSCOPY;  Service: Endoscopy;  Laterality: N/A;   TOTAL KNEE ARTHROPLASTY Right    TOTAL KNEE ARTHROPLASTY Left 12/20/2016   Procedure: LEFT TOTAL KNEE ARTHROPLASTY;  Surgeon: Marcey Raman, MD;  Location: [facility] OR;  Service: Orthopedics;  Laterality: Left;   TRANSTHORACIC ECHOCARDIOGRAM  06/10/2021   EF 45 to 50%.  Mildly reduced function.  Mild hypokinesis of basal-mid inferior and inferolateral wall consistent with prior infarct.  Severe LA dilation.  Mild to moderate MR with moderate MAC.  Mild calcific AS (mean gradient 11 mmHg) --> LV function seems unchanged.  Diastolic function improved.  Wall motion abnormality unchanged.  AS unchanged.   TRANSTHORACIC ECHOCARDIOGRAM  07/2018   Normal LV size.  EF 50 to 55% with mild HK of inferolateral wall.  GRII DD.  Mild AS with mean gradient 15 mm or greater.  Mild ascending aortic dilation.  Mildly increased PA pressures of 41 mmHg   UPPER GI ENDOSCOPY  07/03/2019   VISCERAL ANGIOGRAM  11/03/2016   Procedure: Visceral Angiogram;  Surgeon: Penne Lonni Colorado, MD;  Location: [facility] INVASIVE  CV LAB;  Service: Cardiovascular;;    SH: Social History   Tobacco Use   Smoking status: Former    Current packs/day: 0.00    Average packs/day: 3.0 packs/day for 45.0 years (135.0 ttl pk-yrs)    Types: Cigarettes    Start date: 10/04/1938    Quit date: 10/05/1983    Years since quitting: 40.9   Smokeless tobacco: Never  Vaping Use   Vaping status: Never Used  Substance Use Topics   Alcohol use: Yes    Alcohol/week: 7.0 - 14.0 standard drinks of alcohol    Types: 7 - 14 Cans of beer per week    Comment: 1-2 beers per day   Drug use: No    ROS: Constitutional:  Negative for fever, chills, weight loss CV: Negative for chest pain, previous MI, hypertension Respiratory:  Negative for shortness of breath, wheezing, sleep apnea, frequent cough GI:  Negative for nausea, vomiting, bloody stool, GERD  PE: BP (!) 146/78   Pulse 76  GENERAL APPEARANCE:  Well appearing, well developed, well nourished, NAD HEENT:  Atraumatic, normocephalic, oropharynx clear NECK:  Supple without lymphadenopathy or thyromegaly ABDOMEN:  Soft, non-tender, no masses EXTREMITIES:  Without clubbing, cyanosis, or edema NEUROLOGIC:  Alert and oriented x 3, in wheelchair, CN II-XII grossly intact MENTAL STATUS:  appropriate BACK:  Back brace in place SKIN:  Warm, dry, and intact   Results: U/A: 0-5 WBCs, 3-10 RBCs

## 2024-09-10 DIAGNOSIS — I130 Hypertensive heart and chronic kidney disease with heart failure and stage 1 through stage 4 chronic kidney disease, or unspecified chronic kidney disease: Secondary | ICD-10-CM | POA: Diagnosis not present

## 2024-09-10 DIAGNOSIS — I714 Abdominal aortic aneurysm, without rupture, unspecified: Secondary | ICD-10-CM | POA: Diagnosis not present

## 2024-09-10 DIAGNOSIS — E785 Hyperlipidemia, unspecified: Secondary | ICD-10-CM | POA: Diagnosis not present

## 2024-09-10 DIAGNOSIS — Z96659 Presence of unspecified artificial knee joint: Secondary | ICD-10-CM | POA: Diagnosis not present

## 2024-09-10 DIAGNOSIS — S32029D Unspecified fracture of second lumbar vertebra, subsequent encounter for fracture with routine healing: Secondary | ICD-10-CM | POA: Diagnosis not present

## 2024-09-10 DIAGNOSIS — D631 Anemia in chronic kidney disease: Secondary | ICD-10-CM | POA: Diagnosis not present

## 2024-09-10 DIAGNOSIS — N1832 Chronic kidney disease, stage 3b: Secondary | ICD-10-CM | POA: Diagnosis not present

## 2024-09-10 DIAGNOSIS — Z9181 History of falling: Secondary | ICD-10-CM | POA: Diagnosis not present

## 2024-09-10 DIAGNOSIS — I5020 Unspecified systolic (congestive) heart failure: Secondary | ICD-10-CM | POA: Diagnosis not present

## 2024-09-10 DIAGNOSIS — N401 Enlarged prostate with lower urinary tract symptoms: Secondary | ICD-10-CM | POA: Diagnosis not present

## 2024-09-10 DIAGNOSIS — K219 Gastro-esophageal reflux disease without esophagitis: Secondary | ICD-10-CM | POA: Diagnosis not present

## 2024-09-10 DIAGNOSIS — I480 Paroxysmal atrial fibrillation: Secondary | ICD-10-CM | POA: Diagnosis not present

## 2024-09-13 ENCOUNTER — Other Ambulatory Visit: Payer: Self-pay | Admitting: Family Medicine

## 2024-09-13 ENCOUNTER — Ambulatory Visit: Admitting: Physician Assistant

## 2024-09-13 DIAGNOSIS — I110 Hypertensive heart disease with heart failure: Secondary | ICD-10-CM

## 2024-09-19 ENCOUNTER — Ambulatory Visit (HOSPITAL_COMMUNITY)

## 2024-09-19 ENCOUNTER — Ambulatory Visit: Admitting: Vascular Surgery

## 2024-09-21 LAB — OPHTHALMOLOGY REPORT-SCANNED

## 2024-10-01 ENCOUNTER — Telehealth: Payer: Self-pay

## 2024-10-01 NOTE — Telephone Encounter (Signed)
 Called and gave verbalCopied from CRM [id number]. Topic: Clinical - Home Health Verbal Orders >> Oct 01, 2024  3:49 PM Myrick T wrote: Caller/Agency: Mallie Pizza from [facility] Callback Number: [phone] Service Requested: Physical Therapy Frequency: 2x8w; 1x1w Any new concerns about the patient? No

## 2024-10-08 NOTE — Progress Notes (Signed)
 " Cardiology Office Note:    Date:  10/09/2024   ID:  [name], DOB 07/20/1936, MRN [id number]  PCP:  Joyce Norleen BROCKS, MD   [facility] Providers Cardiologist:  Alm Clay, MD Cardiology APP:  Madie Jon Garre, [place]  Electrophysiologist:  Fonda Kitty, MD     Referring MD: Joyce Norleen BROCKS, MD   Chief Complaint  Patient presents with   Follow-up    Afib    History of Present Illness:    [name] is a 89 y.o. male with a hx of extensive cardiac history including CAD since 1985 with multiple stenting and ultimately a CABG in 1997 with patent LIMA-LAD, DM2, hypertension, CHF, EVAR for AAA followed by Dr. Sheree, asthma, GERD, Afib (new 12/2022) and hyperlipidemia.  Heart cath in 05/2014 with DES to ISR of RCA and OM1.  Nuclear stress test in 05/2021 with intermediate risk and similar findings still prior study.  Echo 06/10/2021 showed LVEF with 45 to 50% and grade 1 diastolic dysfunction, WMA, mildly reduced RV function, severely dilated left atrium, mild to moderate MR, and mild AS.  He has been on Plavix  monotherapy and is intolerant to statins.  He has not been interested in PCSK9 inhibitor or Nexletol .  He is maintained on Zetia  and fenofibrate .  Antianginal regimen includes amlodipine  and ranolazine .  He has CKD with serum creatinine 1.5-1.6.  He presented to [facility] ED on 08/19/2021 with chest pain.  He underwent left heart catheterization with continued patency of the LIMA-LAD but distal circumflex disease treated with DES.  He was started on aspirin  and Brilinta .  He is not on a beta-blocker due to history of bradycardia.  Recent CT abdomen pelvis with possible persistent endoleak resulting in aneurysmal sac enlargement.  He was advised to follow-up with the [facility] as outpatient. I saw him post cath for TOC.  Post cath he was switched from brilinta  to plavix  for SOB. He was recommended for long-term plavix  monotherapy after 12 months of DAPT.   Not on BB due to  fatigue Statin myopathy - on zetia  and nexletol   SBP goal < 160 given age - on ARB and HCTZ  Last seen by Dr. Clay and continued to complain of fatigue. Avoiding BB. Felt related to balance issues causing increased effort. Vestibular therapy was recommended.   During a preoperative evaluation with anesthesia, EKG noted to be new A-fib.  He contacted cardiology office who confirmed atrial fibrillation and he was started on Eliquis  2.5 mg twice daily with discontinuation of aspirin . During follow up 03/2023, he was only taking eliquis  once daily due to cost. I provided patient assistance.   Echo was obtained and showed persistent EF of 45-50%, mildly reduced RV function, elevated PASP, severe BAE, mild to moderate MR, mild AS.  Unfortunately he called our office with reports of black stool and was advised to stop Eliquis .  CBC was repeated and hemoglobin was 10.7, this was up from 10.0 when checked in June.  He was advised to follow-up with his PCP to evaluate anemia.  He called our office asking for refills of Plavix , however was advised to not take Plavix  but continue Eliquis  2.5 mg twice daily.   He has since followed up with urology for gross hematuria and underwent cystoscopy that showed resolving clot in the bladder but no focal mucosal lesions.  He does have a large friable prostate with intravesical extension felt likely the source of his bleeding on anticoagulation.  Proscar  started, avoiding  alpha-blocker given issues with unsteady gait and falls.  He is currently not anticoagulated for his atrial fibrillation.    CTA 07/31/24 with enlarging infrarenal aneurysm up to 7.7 from 7.0. Seen by [facility] 08/02/24 who reported pt did not want another repair and referred to radiology OP. This appt has not occurred.   Unfortunately he was hospitalized 08/2024 after a mechanical fall in which he sustained a paraspinal hematoma, displaced fracture of superior endplate of L2.  Neurosurgery recommended  conservative management, no surgery.  Cardiology was consulted and he continued to decline anticoagulation for PAF.  He has fallen several times since last seen. He is now doing PT which is helping with balance. He reports not remembering declining AAA repair with [facility]. He reports abdominal pain that feels like a tightening belt when he was discharged. Pain has been better in recent weeks. BP mildly elevated today. No acute abdominal pain. We discussed ER precautions.    Past Medical History:  Diagnosis Date   Adenomatous colon polyp    Ankle edema    Chronic   Asthma    CAD S/P percutaneous coronary angioplasty 2004   a) '04: Staged Taxus DES PCI to RCA (2 Taxus 2.75 x 32 & 12) and Cx-OM (Taxus 3 x 20);;'07 - PCI to pRCA ISR- Cypher DES; c) 05/2014: pPCI pRCA stentISR (Promus DES 3 x 8) OM1 distal to stent (Promus DES 2.75 x 12 - 3.1); 11/'22: distal LCx stent edge 99% -> Onyx Frontier DES 2.5 x 15 - 2.8 (overlaps prior stent)   CAD, multiple vessel 1985   Most recent July 2018: Admitted for non-STEMI. Occluded LAD with patent LIMA (SP1 now occluded).  Patent stents in proximal and mid RCA as well as circumflex-OM 3. Known occlusion of SVG-OM. EF 45-50%   CHF (congestive heart failure) (HCC)    Cholelithiasis - with cholangitis & choledocholithiasis    status post ERCP with removal of calculi and biliary stent placement.   Chronic anemia    On iron supplement; history of positive guaiac - negative colonoscopy in 1996.; Thought to be related to hemorrhoids; status post hemorrhoidectomy   Chronic back pain     multiple surgeries; C-spine and lumbar   Diabetes mellitus    no meds- pt states he is no longer DM   Diverticulitis of colon 1996   Diverticulosis    Dyslipidemia, goal LDL below 70    Erectile dysfunction    Exertional dyspnea    Chronic baseline SOB with ambulation   GERD (gastroesophageal reflux disease)    Grade II diastolic dysfunction    H/O: pneumonia 11/2012   Hemorrhoids     Hiatal hernia    History of: ST elevation myocardial infarction (STEMI) involving left circumflex coronary artery with complication 1985   PTCA-circumflex; PCI in 1991   Hypertension    Moderate aortic stenosis by prior echocardiogram 07/2015   Normal LV function - EF 55-60%.. Abnormal relaxation. Mild-moderate aortic stenosis (peak/mean gradient 18/10 mmH)   Osteoarthritis of both knees    And back; multiple back surgeries, right knee arthroplasty and left knee arthroscopic surgery x2   Pneumonia 2016   S/P AAA (abdominal aortic aneurysm) repair 08/30/2012   s/p EVAR   S/P CABG x 2 1997   LIMA-LAD, SVG-OM   Sleep apnea    pt. states he was told to return for f/u, to be fitted for Cpap, but pt. reports that he didn't follow up-no cpap used   Statin myopathy 09/30/2015  Past Surgical History:  Procedure Laterality Date   Abdominal and Lower Extremity Arterial Ultrasound  08/23/2012; 10/10/2013   Normal ABIs. Nonocclusive lower extremity disease. 4.2 cm x 4.3 cm infrarenal AAA;; 4.4 cm x 4.3 cm (essentially stable)    ABDOMINAL AORTIC ENDOVASCULAR STENT GRAFT N/A 08/13/2015   Procedure: ABDOMINAL AORTIC ENDOVASCULAR STENT GRAFT;  Surgeon: Lynwood JONETTA Collum, MD;  Location: [facility] OR;  Service: Vascular;  Laterality: N/A;   Anterior cervical plating  04/23/2010   At C4-5 and a C6-7 utilizing two separate Biomet MaxAn plates.   ANTERIOR LAT LUMBAR FUSION Left 11/22/2012   Procedure: ANTERIOR LATERAL LUMBAR FUSION 1 LEVEL;  Surgeon: Alm GORMAN Molt, MD;  Location: [facility] NEURO ORS;  Service: Neurosurgery;  Laterality: Left;  Anterior Lateral Lumbar Fusion Lumbar Three-Four   BACK SURGERY  1979 & x 10   pt. remarks, I have had about 10 back surgeries   BILIARY STENT PLACEMENT N/A 07/03/2014   Procedure: BILIARY STENT PLACEMENT;  Surgeon: Lamar JONETTA Aho, MD;  Location: [facility] ENDOSCOPY;  Service: Endoscopy;  Laterality: N/A;   CARPAL TUNNEL RELEASE Left 11/16/2019   Procedure: LEFT CARPAL TUNNEL  RELEASE;  Surgeon: Vernetta Lonni GRADE, MD;  Location: [facility] ORS;  Service: Orthopedics;  Laterality: Left;   CARPAL TUNNEL RELEASE Right 01/10/2023   Procedure: RIGHT CARPAL TUNNEL RELEASE;  Surgeon: Molt Alm GORMAN, MD;  Location: [facility] OR;  Service: Neurosurgery;  Laterality: Right;   CATARACT EXTRACTION     Cervical arthrodesis  04/23/2010   Anterior cervical arthrodesis, C4-5, C6-7 utilizing 7-mm PEEK interbody cage packed with local autograft & Antifuse putty at C4-5 & an 8-mm cage at C6-7.   CERVICAL DISCECTOMY  04/23/2010   Decompressive anterior carvical diskectomy. C4-5, C6-7   CHOLECYSTECTOMY N/A 05/23/2015   Procedure: LAPAROSCOPIC CHOLECYSTECTOMY WITH INTRAOPERATIVE CHOLANGIOGRAM;  Surgeon: Alm Angle, MD;  Location: [facility] ORS;  Service: General;  Laterality: N/A;   COLONOSCOPY  1996   CORONARY ANGIOPLASTY  1985,1991,15   1985 lateral STEMI Circumflex PTCA;    CORONARY ARTERY BYPASS GRAFT  1997   LIMA-LAD, SVG-OM (SVG known to be occluded prior to 2004)   CORONARY STENT INTERVENTION N/A 08/20/2021   Procedure: CORONARY STENT INTERVENTION;  Surgeon: Court Dorn PARAS, MD;  Location: [facility] INVASIVE CV LAB;  Service: Cardiovascular:  dLCx distal Edge 99% => Onyx Frontier DES 2.5 x 15 (2.8 mm) overlaps prior stent distally.   ERCP N/A 07/03/2014   Procedure: ENDOSCOPIC RETROGRADE CHOLANGIOPANCREATOGRAPHY (ERCP);  Surgeon: Lamar JONETTA Aho, MD;  Location: [facility] ENDOSCOPY;  Service: Endoscopy;  Laterality: N/A;   ERCP N/A 07/05/2014   Procedure: ENDOSCOPIC RETROGRADE CHOLANGIOPANCREATOGRAPHY (ERCP);  Surgeon: Lamar JONETTA Aho, MD;  Location: [facility] ENDOSCOPY;  Service: Endoscopy;  Laterality: N/A;   ERCP N/A 06/30/2015   Procedure: ENDOSCOPIC RETROGRADE CHOLANGIOPANCREATOGRAPHY (ERCP);  Surgeon: Gwendlyn ONEIDA Buddy, MD;  Location: [facility];  Service: Endoscopy;  Laterality: N/A;   EYE SURGERY     IR ANGIOGRAM EXTREMITY LEFT  01/18/2017   IR ANGIOGRAM PELVIS SELECTIVE OR SUPRASELECTIVE  01/18/2017   IR  ANGIOGRAM SELECTIVE EACH ADDITIONAL VESSEL  01/18/2017   IR AORTAGRAM ABDOMINAL SERIALOGRAM  01/18/2017   IR EMBO ARTERIAL NOT HEMORR HEMANG INC GUIDE ROADMAPPING  01/18/2017   IR RADIOLOGIST EVAL & MGMT  01/04/2017   IR RADIOLOGIST EVAL & MGMT  10/26/2017   IR RADIOLOGIST EVAL & MGMT  05/24/2018   IR RADIOLOGIST EVAL & MGMT  06/19/2019   IR RADIOLOGIST EVAL & MGMT  06/11/2020   IR RADIOLOGIST  EVAL & MGMT  07/02/2021   IR RADIOLOGIST EVAL & MGMT  06/22/2022   IR RADIOLOGIST EVAL & MGMT  07/08/2023   IR US  GUIDE VASC ACCESS RIGHT  01/18/2017   KNEE ARTHROSCOPY Left    x 2   LEFT HEART CATH AND CORS/GRAFTS ANGIOGRAPHY  06/2003   None Occluded vein graft to OM; diffuse RCA disease in the mid vessel, 80% circumflex-OM stenosis; follow on AV groove circumflex with sequential 90% stenoses and intervening saccular dilation    LEFT HEART CATH AND CORS/GRAFTS ANGIOGRAPHY  12/2008   4 abnormal Myoview  showing apical thinning (possibly due to apical LAD 95%) : 100% Occluded LAD after SV1, distal LAD grafted via LIMA -apical 95% . Cx -OM1 w/patent stent extending into OM 1 . Follow on Cx - 70-80% - non-amenable PCI. RCA widely patent 3 overlapping stents in mRCA w/less than 40% stenosisin RPL; SVG-OM known occluded    LEFT HEART CATH AND CORS/GRAFTS ANGIOGRAPHY N/A 04/20/2017   Procedure: Left Heart Cath and Cors/Grafts Angiography;  Surgeon: Court Dorn PARAS, MD;  Location: [facility] INVASIVE CV LAB;  LAD now occluded prior to SP1.LIMA-LAD. Patent RCA and circumflex stents. EF 45-50%. Relatively stable.    LEFT HEART CATH AND CORS/GRAFTS ANGIOGRAPHY N/A 08/20/2021   Procedure: LEFT HEART CATH AND CORS/GRAFTS ANGIOGRAPHY;  Surgeon: Court Dorn PARAS, MD;  Location: [facility] INVASIVE CV LAB;  Service: Cardiovascular: CULPRIT 99% dLCx just after stent (Overlapping DES PCI) - otw patent LCx stent, patent RCA stent & Patent LIMA-LAD with CTO of prox LAD.   LEFT HEART CATHETERIZATION WITH CORONARY ANGIOGRAM N/A 05/15/2014    Procedure: LEFT HEART CATHETERIZATION WITH CORONARY ANGIOGRAM;  Surgeon: Victory LELON Claudene DOUGLAS, MD;  Location: [facility] CATH LAB;  Service: Cardiovascular;  Laterality: N/A;   LEFT HEART CATHETERIZATION WITH CORONARY/GRAFT ANGIOGRAM N/A 06/28/2014   Procedure: LEFT HEART CATHETERIZATION WITH EL BILE;  Surgeon: Debby DELENA Sor, MD;  Location: [facility] CATH LAB;  Service: Cardiovascular;  Laterality: N/A;   LUMBAR PERCUTANEOUS PEDICLE SCREW 1 LEVEL N/A 11/22/2012   Procedure: LUMBAR PERCUTANEOUS PEDICLE SCREW 1 LEVEL;  Surgeon: Alm GORMAN Molt, MD;  Location: [facility] NEURO ORS;  Service: Neurosurgery;  Laterality: N/A;  Lumbar Three-Four Percutaneous Pedicle Screw, Lateral approach   MINOR HEMORRHOIDECTOMY     NM MYOVIEW  LTD  05/26/2021   Lexiscan : Intermediate risk (similar findings to last study), with exception of reduced EF roughly 40%..  Prior basal inferior-inferolateral infarct without peri-infarct ischemia.   ->  Echo ordered -> (EF 45 to 50%)-when compared to prior Myoview , infarct appears similar.   PERCUTANEOUS CORONARY STENT INTERVENTION (PCI-S)  06/2003   PCI - RCA 2 overlapping Taxus DES 2.75 mm x 32 mm and 2.75 mm x 12 mm (3.0 mm); PCI-Cx-OM1 - Taxus DES 3.0 mm x 20 mm (3.1 mm);    PERCUTANEOUS CORONARY STENT INTERVENTION (PCI-S)  12/2005   80% ISR in proximal Taxus stent in RCA -- covered proximally with Cypher DES 3.0 mm x 12 mm   PERCUTANEOUS CORONARY STENT INTERVENTION (PCI-S) N/A 05/17/2014   Procedure: PERCUTANEOUS CORONARY STENT INTERVENTION (PCI-S);  Surgeon: Victory LELON Claudene DOUGLAS, MD;  Location: [facility] CATH LAB: PCI pRCA stent ISR - Promus DES 3.0 x 8 (3.25), OM distal stent edge - Promus DES 2.75 x 12 (3.1)   PERIPHERAL VASCULAR CATHETERIZATION  11/03/2016   Procedure: Embolization;  Surgeon: Penne Lonni Colorado, MD;  Location: [facility] INVASIVE CV LAB;  Service: Cardiovascular;;   POSTERIOR CERVICAL FUSION/FORAMINOTOMY N/A 05/02/2013   Procedure: POSTERIOR CERVICAL  FUSION/FORAMINOTOMY  CERVICAL SEVEN THORACIC-ONE;  Surgeon: Alm GORMAN Molt, MD;  Location: [facility] NEURO ORS;  Service: Neurosurgery;  Laterality: N/A;  POSTERIOR CERVICAL FUSION/FORAMINOTOMY CERVICAL SEVEN THORACIC-ONE   SPHINCTEROTOMY N/A 06/30/2015   Procedure: SPHINCTEROTOMY;  Surgeon: Gwendlyn ONEIDA Buddy, MD;  Location: [facility] ENDOSCOPY;  Service: Endoscopy;  Laterality: N/A;   TOTAL KNEE ARTHROPLASTY Right    TOTAL KNEE ARTHROPLASTY Left 12/20/2016   Procedure: LEFT TOTAL KNEE ARTHROPLASTY;  Surgeon: Marcey Raman, MD;  Location: [facility] OR;  Service: Orthopedics;  Laterality: Left;   TRANSTHORACIC ECHOCARDIOGRAM  06/10/2021   EF 45 to 50%.  Mildly reduced function.  Mild hypokinesis of basal-mid inferior and inferolateral wall consistent with prior infarct.  Severe LA dilation.  Mild to moderate MR with moderate MAC.  Mild calcific AS (mean gradient 11 mmHg) --> LV function seems unchanged.  Diastolic function improved.  Wall motion abnormality unchanged.  AS unchanged.   TRANSTHORACIC ECHOCARDIOGRAM  07/2018   Normal LV size.  EF 50 to 55% with mild HK of inferolateral wall.  GRII DD.  Mild AS with mean gradient 15 mm or greater.  Mild ascending aortic dilation.  Mildly increased PA pressures of 41 mmHg   UPPER GI ENDOSCOPY  07/03/2019   VISCERAL ANGIOGRAM  11/03/2016   Procedure: Visceral Angiogram;  Surgeon: Penne Lonni Colorado, MD;  Location: [facility] INVASIVE CV LAB;  Service: Cardiovascular;;    Current Medications: Active Medications[1]   Allergies:   Roxicodone  [oxycodone ], Statins, Zestril [lisinopril], Eliquis  [apixaban ], and Welchol [colesevelam hcl]   Social History   Socioeconomic History   Marital status: Married    Spouse name: Not on file   Number of children: Not on file   Years of education: Not on file   Highest education level: Not on file  Occupational History   Not on file  Tobacco Use   Smoking status: Former    Current packs/day: 0.00    Average packs/day: 3.0 packs/day for 45.0 years (135.0 ttl  pk-yrs)    Types: Cigarettes    Start date: 10/04/1936    Quit date: 10/05/1983    Years since quitting: 41.0   Smokeless tobacco: Never  Vaping Use   Vaping status: Never Used  Substance and Sexual Activity   Alcohol use: Yes    Alcohol/week: 7.0 - 14.0 standard drinks of alcohol    Types: 7 - 14 Cans of beer per week    Comment: 1-2 beers per day   Drug use: No   Sexual activity: Not Currently  Other Topics Concern   Not on file  Social History Narrative   He is married, father of two, grandfather to four, great grandfather to two.    Not really getting much exercise now, do to his significant back and hip pain.    He does not smoke and only has an alcoholic beverage.    Social Drivers of Health   Tobacco Use: Medium Risk (10/09/2024)   Patient History    Smoking Tobacco Use: Former    Smokeless Tobacco Use: Never    Passive Exposure: Not on Actuary Strain: Not on file  Food Insecurity: No Food Insecurity (08/01/2024)   Epic    Worried About Programme Researcher, Broadcasting/film/video in the Last Year: Never true    Ran Out of Food in the Last Year: Never true  Transportation Needs: No Transportation Needs (08/01/2024)   Epic    Lack of Transportation (Medical): No    Lack of Transportation (Non-Medical): No  Physical Activity: Not on file  Stress: Not on file  Social Connections: Socially Integrated (08/01/2024)   Social Connection and Isolation Panel    Frequency of Communication with Friends and Family: Three times a week    Frequency of Social Gatherings with Friends and Family: Twice a week    Attends Religious Services: More than 4 times per year    Active Member of Clubs or Organizations: Yes    Attends Banker Meetings: More than 4 times per year    Marital Status: Married  Depression (PHQ2-9): Low Risk (02/29/2024)   Depression (PHQ2-9)    PHQ-2 Score: 0  Alcohol Screen: Not on file  Housing: Low Risk (08/01/2024)   Epic    Unable to Pay for Housing in  the Last Year: No    Number of Times Moved in the Last Year: 0    Homeless in the Last Year: No  Utilities: Not At Risk (08/01/2024)   Epic    Threatened with loss of utilities: No  Health Literacy: Not on file     Family History: The patient's family history includes Arthritis in his mother; Breast cancer in his sister; Diabetes in his father and sister; Heart disease in his brother, father, and sister; Hypertension in his sister; Stroke in his sister; Ulcers in his brother. There is no history of Colon cancer.  ROS:   Please see the history of present illness.     All other systems reviewed and are negative.  EKGs/Labs/Other Studies Reviewed:    The following studies were reviewed today:  EKG Interpretation Date/Time:  Tuesday October 09 2024 10:34:37 EST Ventricular Rate:  67 PR Interval:    QRS Duration:  108 QT Interval:  392 QTC Calculation: 414 R Axis:   -7  Text Interpretation: Atrial fibrillation with premature ventricular or aberrantly conducted complexes T wave abnormality, consider inferior ischemia T wave abnormality, consider anterolateral ischemia When compared with ECG of 01-Aug-2024 16:07, Inverted T waves have replaced nonspecific T wave abnormality in Inferior leads Confirmed by Madie Slough ([id number]) on 10/09/2024 10:39:45 AM    Recent Labs: 07/31/2024: ALT 13 08/05/2024: BUN 33; Creatinine, Ser 1.69; Hemoglobin 9.7; Platelets 282; Potassium 3.9; Sodium 136  Recent Lipid Panel    Component Value Date/Time   CHOL 160 02/29/2024 1517   TRIG 99 02/29/2024 1517   HDL 36 (L) 02/29/2024 1517   CHOLHDL 4.4 02/29/2024 1517   CHOLHDL 3.5 08/20/2021 0427   VLDL 17 08/20/2021 0427   LDLCALC 106 (H) 02/29/2024 1517     Risk Assessment/Calculations:    CHA2DS2-VASc Score = 6   This indicates a 9.7% annual risk of stroke. The patient's score is based upon: CHF History: 1 HTN History: 1 Diabetes History: 1 Stroke History: 0 Vascular Disease History: 1 Age  Score: 2 Gender Score: 0         Physical Exam:    VS:  BP (!) 142/70 (Cuff Size: Normal)   Pulse 67   Ht 5' 9 (1.753 m)   Wt 195 lb (88.5 kg)   SpO2 98%   BMI 28.80 kg/m     Wt Readings from Last 3 Encounters:  10/09/24 195 lb (88.5 kg)  08/08/24 190 lb (86.2 kg)  04/17/24 197 lb 3.2 oz (89.4 kg)     GEN:  Well nourished, well developed in no acute distress HEENT: Normal NECK: No JVD; No carotid bruits LYMPHATICS: No lymphadenopathy CARDIAC: irregular rhythm regular rate, 2/6 murmyr RESPIRATORY:  Clear to  auscultation without rales, wheezing or rhonchi  ABDOMEN: Soft, non-tender, non-distended MUSCULOSKELETAL:  B LE edema; No deformity  SKIN: Warm and dry NEUROLOGIC:  Alert and oriented x 3 PSYCHIATRIC:  Normal affect   ASSESSMENT:    1. Paroxysmal atrial fibrillation (HCC)   2. Aneurysm of infrarenal abdominal aorta, unspecified whether ruptured   3. Cardiac murmur   4. Mitral valve insufficiency, unspecified etiology   5. Aortic valve insufficiency, etiology of cardiac valve disease unspecified   6. CAD S/P CABG '95- several PCis since, last 05/17/14   7. Hyperlipidemia with target LDL less than 70   8. Primary hypertension    PLAN:    In order of problems listed above:  Chronic systolic heart failure - LVEF 54-49% - on ARB-hydrochlorothiazide  - no S/S volume overload   Cardiac murmur on exam - echo with mild to moderate MR, mild to moderate AI - no resting SOB or chest pain, some mild DOE that his baseline   Paroxysmal atrial fibrillation Noted during PAT clinic 12/2022 Not anticoagulated, he understands the risk He has been seen by EP for consideration of Watchman EKG today with Afib and PVC   CAD with history of CABG 1997 DES to distal Lcx He is now on aspirin  monotherapy No beta-blocker with history of bradycardia and fatigue Continue ranolazine  and amlodipine  - increase amlodipine  to 7.5 mg   Hyperlipidemia with LDL goal <  70 11/02/2022: Cholesterol, Total 188; HDL 48; LDL Chol Calc (NIH) 121; Triglycerides 102 On Zetia  and fenofibrate  Statin intolerance, not interested in PCSK9 inhibitor Nexlitol added at last visit Tells me he did try repatha  and didn't tolerate it   Hypertension - currently on amlodipine , valsartan -HCTZ - will increase amlodipine  from 5 mg to 7.5 mg - avoiding BB due to hx of bradycardia   AAA with possible endoleak and aneurysmal sac - I have messaged [facility] to get an appt, he now would like to discuss options with [facility] - will increase amlodipine  for better BP control - will also submit referral to interventional radiology   Follow up with Dr. Anner (MD only) in 4 months.       Medication Adjustments/Labs and Tests Ordered: Current medicines are reviewed at length with the patient today.  Concerns regarding medicines are outlined above.  Orders Placed This Encounter  Procedures   Ambulatory referral to Vascular Surgery   Ambulatory referral to Interventional Radiology   EKG 12-Lead   No orders of the defined types were placed in this encounter.   Patient Instructions  Medication Instructions:  Your physician recommends that you continue on your current medications as directed. Please refer to the Current Medication list given to you today.  *If you need a refill on your cardiac medications before your next appointment, please call your pharmacy*  Lab Work: NONE ordered at this time of appointment   Testing/Procedures: NONE ordered at this time of appointment   Follow-Up: At [facility], you and your health needs are our priority.  As part of our continuing mission to provide you with exceptional heart care, our providers are all part of one team.  This team includes your primary Cardiologist (physician) and Advanced Practice Providers or APPs (Physician Assistants and Nurse Practitioners) who all work together to provide you with the care you need, when you  need it.  Your next appointment:   4-6 months Dr. Anner Dr. [place] 1st Available ([facility]) Needs apt with Interventional Radiology   We recommend signing up for the  patient portal called MyChart.  Sign up information is provided on this After Visit Summary.  MyChart is used to connect with patients for Virtual Visits (Telemedicine).  Patients are able to view lab/test results, encounter notes, upcoming appointments, etc.  Non-urgent messages can be sent to your provider as well.   To learn more about what you can do with MyChart, go to [email or web address].   Other Instructions            Signed, Jon Nat Hails, [place]  10/09/2024 11:48 AM    [facility]     [1]  Current Meds  Medication Sig   acetaminophen  (TYLENOL ) 325 MG tablet Take 2 tablets (650 mg total) by mouth every 6 (six) hours as needed for mild pain (pain score 1-3).   amLODipine  (NORVASC ) 5 MG tablet Take 1 tablet by mouth once daily   aspirin  EC 81 MG tablet Take 81 mg by mouth daily.   Choline Fenofibrate  (FENOFIBRIC ACID ) 135 MG CPDR Take 1 tablet by mouth daily.   docusate sodium  (COLACE) 100 MG capsule Take 1 capsule (100 mg total) by mouth 2 (two) times daily as needed for mild constipation.   ezetimibe  (ZETIA ) 10 MG tablet Take 1 tablet (10 mg total) by mouth daily.   ferrous sulfate  325 (65 FE) MG tablet Take 325 mg by mouth daily.   finasteride  (PROSCAR ) 5 MG tablet Take 1 tablet (5 mg total) by mouth daily.   lidocaine  (LIDODERM ) 5 % Place 1 patch onto the skin daily. Remove & Discard patch within 12 hours or as directed by MD   methocarbamol  (ROBAXIN ) 500 MG tablet Take 1 tablet (500 mg total) by mouth every 8 (eight) hours as needed for muscle spasms.   Multiple Vitamins-Minerals (PRESERVISION AREDS 2+MULTI VIT) CAPS Take 1 capsule by mouth 2 (two) times daily.   pantoprazole  (PROTONIX ) 40 MG tablet Take 1 tablet (40 mg total) by mouth daily.   polyethylene glycol (MIRALAX  / GLYCOLAX ) 17 g  packet Take 17 g by mouth 2 (two) times daily as needed (constipation).   ranolazine  (RANEXA ) 500 MG 12 hr tablet Take 1 tablet (500 mg total) by mouth daily.   valsartan -hydrochlorothiazide  (DIOVAN -HCT) 320-12.5 MG tablet Take 1 tablet by mouth once daily   "

## 2024-10-09 ENCOUNTER — Encounter: Payer: Self-pay | Admitting: Physician Assistant

## 2024-10-09 ENCOUNTER — Ambulatory Visit: Attending: Physician Assistant | Admitting: Physician Assistant

## 2024-10-09 VITALS — BP 142/70 | HR 67 | Ht 69.0 in | Wt 195.0 lb

## 2024-10-09 DIAGNOSIS — I10 Essential (primary) hypertension: Secondary | ICD-10-CM

## 2024-10-09 DIAGNOSIS — R011 Cardiac murmur, unspecified: Secondary | ICD-10-CM | POA: Diagnosis not present

## 2024-10-09 DIAGNOSIS — I7143 Infrarenal abdominal aortic aneurysm, without rupture: Secondary | ICD-10-CM | POA: Diagnosis not present

## 2024-10-09 DIAGNOSIS — I25119 Atherosclerotic heart disease of native coronary artery with unspecified angina pectoris: Secondary | ICD-10-CM | POA: Diagnosis not present

## 2024-10-09 DIAGNOSIS — E785 Hyperlipidemia, unspecified: Secondary | ICD-10-CM | POA: Diagnosis not present

## 2024-10-09 DIAGNOSIS — I340 Nonrheumatic mitral (valve) insufficiency: Secondary | ICD-10-CM | POA: Diagnosis not present

## 2024-10-09 DIAGNOSIS — I480 Paroxysmal atrial fibrillation: Secondary | ICD-10-CM

## 2024-10-09 DIAGNOSIS — I351 Nonrheumatic aortic (valve) insufficiency: Secondary | ICD-10-CM

## 2024-10-09 NOTE — Patient Instructions (Addendum)
 Medication Instructions:  Your physician recommends that you continue on your current medications as directed. Please refer to the Current Medication list given to you today.  *If you need a refill on your cardiac medications before your next appointment, please call your pharmacy*  Lab Work: NONE ordered at this time of appointment   Testing/Procedures: NONE ordered at this time of appointment   Follow-Up: At [facility], you and your health needs are our priority.  As part of our continuing mission to provide you with exceptional heart care, our providers are all part of one team.  This team includes your primary Cardiologist (physician) and Advanced Practice Providers or APPs (Physician Assistants and Nurse Practitioners) who all work together to provide you with the care you need, when you need it.  Your next appointment:   4-6 months Dr. Anner Dr. [place] 1st Available (VVS) Needs apt with Interventional Radiology   We recommend signing up for the patient portal called MyChart.  Sign up information is provided on this After Visit Summary.  MyChart is used to connect with patients for Virtual Visits (Telemedicine).  Patients are able to view lab/test results, encounter notes, upcoming appointments, etc.  Non-urgent messages can be sent to your provider as well.   To learn more about what you can do with MyChart, go to [email or web address].   Other Instructions

## 2024-10-11 ENCOUNTER — Telehealth: Payer: Self-pay

## 2024-10-11 ENCOUNTER — Other Ambulatory Visit: Payer: Self-pay | Admitting: *Deleted

## 2024-10-11 DIAGNOSIS — I714 Abdominal aortic aneurysm, without rupture, unspecified: Secondary | ICD-10-CM

## 2024-10-11 NOTE — Telephone Encounter (Signed)
 Pt appt scheduled.

## 2024-10-15 ENCOUNTER — Ambulatory Visit (HOSPITAL_COMMUNITY)

## 2024-10-15 ENCOUNTER — Encounter: Payer: Self-pay | Admitting: Surgery

## 2024-10-15 ENCOUNTER — Ambulatory Visit (HOSPITAL_COMMUNITY): Attending: Surgery | Admitting: Surgery

## 2024-10-15 VITALS — BP 127/78 | HR 74 | Temp 97.9°F | Resp 20 | Ht 69.0 in | Wt 195.0 lb

## 2024-10-15 DIAGNOSIS — I7143 Infrarenal abdominal aortic aneurysm, without rupture: Secondary | ICD-10-CM | POA: Diagnosis not present

## 2024-10-15 NOTE — Progress Notes (Signed)
 "                                    [facility] [place]  Patient name: [name] MRN: [id number] DOB: 01/23/1936 Sex: male   REASON FOR VISIT:    Follow up  HISOTRY OF PRESENT ILLNESS:    [name] is a 89 y.o. male who is status post endovascular repair of an abdominal aortic aneurysm on 08/13/2015 by Dr. Gerlean.  He underwent coil embolization of the IMA in 2018 by Dr. Sheree for type II endoleak.  He has also undergone 2 endoleak repairs by interventional radiology.  At his last visit in 2023 he was reluctant to proceed with repeat repair, however he is now considering it.  At the time of his initial embolization in 2018 his aneurysm measured 5.5 cm.  In 2024 it measured 7 cm.  His last CT scan was on 07/31/2024.  Maximum diameter was 7.7 cm  Patient has a history of coronary artery disease, status post PCI and CABG.  He is medically managed for hypertension.  He suffers of hypercholesterolemia but is intolerant to statins and is not interested in PCSK9.  He does take Zetia .  He does suffer from CKD with a baseline creatinine of 1.6.   PAST MEDICAL HISTORY:   Past Medical History:  Diagnosis Date   AAA (abdominal aortic aneurysm)    Adenomatous colon polyp    Ankle edema    Chronic   Asthma    CAD S/P percutaneous coronary angioplasty 2004   a) '04: Staged Taxus DES PCI to RCA (2 Taxus 2.75 x 32 & 12) and Cx-OM (Taxus 3 x 20);;'07 - PCI to pRCA ISR- Cypher DES; c) 05/2014: pPCI pRCA stentISR (Promus DES 3 x 8) OM1 distal to stent (Promus DES 2.75 x 12 - 3.1); 11/'22: distal LCx stent edge 99% -> Onyx Frontier DES 2.5 x 15 - 2.8 (overlaps prior stent)   CAD, multiple vessel 1985   Most recent July 2018: Admitted for non-STEMI. Occluded LAD with patent LIMA (SP1 now occluded).  Patent stents in proximal and mid RCA as well as circumflex-OM 3. Known occlusion of SVG-OM. EF 45-50%   CHF (congestive heart failure) (HCC)    Cholelithiasis - with  cholangitis & choledocholithiasis    status post ERCP with removal of calculi and biliary stent placement.   Chronic anemia    On iron supplement; history of positive guaiac - negative colonoscopy in 1996.; Thought to be related to hemorrhoids; status post hemorrhoidectomy   Chronic back pain     multiple surgeries; C-spine and lumbar   Diabetes mellitus    no meds- pt states he is no longer DM   Diverticulitis of colon 1996   Diverticulosis    Dyslipidemia, goal LDL below 70    Erectile dysfunction    Exertional dyspnea    Chronic baseline SOB with ambulation   GERD (gastroesophageal reflux disease)    Grade II diastolic dysfunction    H/O: pneumonia 11/2012   Hemorrhoids    Hiatal hernia    History of: ST elevation myocardial infarction (STEMI) involving left circumflex coronary artery with complication 1985   PTCA-circumflex; PCI in 1991   Hypertension    Moderate aortic stenosis by prior echocardiogram 07/2015   Normal LV function - EF 55-60%.. Abnormal relaxation. Mild-moderate aortic stenosis (peak/mean gradient 18/10 mmH)   Osteoarthritis  of both knees    And back; multiple back surgeries, right knee arthroplasty and left knee arthroscopic surgery x2   Pneumonia 2016   S/P AAA (abdominal aortic aneurysm) repair 08/30/2012   s/p EVAR   S/P CABG x 2 1997   LIMA-LAD, SVG-OM   Sleep apnea    pt. states he was told to return for f/u, to be fitted for Cpap, but pt. reports that he didn't follow up-no cpap used   Statin myopathy 09/30/2015     FAMILY HISTORY:   Family History  Problem Relation Age of Onset   Arthritis Mother    Diabetes Father    Heart disease Father    Stroke Sister    Hypertension Sister    Heart disease Sister    Diabetes Sister    Breast cancer Sister    Heart disease Brother    Ulcers Brother    Colon cancer Neg Hx     SOCIAL HISTORY:   Social History   Tobacco Use   Smoking status: Former    Current packs/day: 0.00    Average  packs/day: 3.0 packs/day for 45.0 years (135.0 ttl pk-yrs)    Types: Cigarettes    Start date: 10/04/1936    Quit date: 10/05/1983    Years since quitting: 41.0   Smokeless tobacco: Never  Substance Use Topics   Alcohol use: Yes    Alcohol/week: 7.0 - 14.0 standard drinks of alcohol    Types: 7 - 14 Cans of beer per week    Comment: 1-2 beers per day     ALLERGIES:   Allergies[1]   CURRENT MEDICATIONS:   Current Outpatient Medications  Medication Sig Dispense Refill   acetaminophen  (TYLENOL ) 325 MG tablet Take 2 tablets (650 mg total) by mouth every 6 (six) hours as needed for mild pain (pain score 1-3).     amLODipine  (NORVASC ) 5 MG tablet Take 1 tablet by mouth once daily 90 tablet 1   aspirin  EC 81 MG tablet Take 81 mg by mouth daily.     Choline Fenofibrate  (FENOFIBRIC ACID ) 135 MG CPDR Take 1 tablet by mouth daily. 90 capsule 2   docusate sodium  (COLACE) 100 MG capsule Take 1 capsule (100 mg total) by mouth 2 (two) times daily as needed for mild constipation.     ezetimibe  (ZETIA ) 10 MG tablet Take 1 tablet (10 mg total) by mouth daily. 90 tablet 3   ferrous sulfate  325 (65 FE) MG tablet Take 325 mg by mouth daily.     finasteride  (PROSCAR ) 5 MG tablet Take 1 tablet (5 mg total) by mouth daily. 90 tablet 3   fluticasone -salmeterol (ADVAIR) 100-50 MCG/ACT AEPB Inhale 1 puff into the lungs 2 (two) times daily. (Patient taking differently: Inhale 1 puff into the lungs as needed (bronchitis).) 1 each 0   lidocaine  (LIDODERM ) 5 % Place 1 patch onto the skin daily. Remove & Discard patch within 12 hours or as directed by MD 30 patch 0   methocarbamol  (ROBAXIN ) 500 MG tablet Take 1 tablet (500 mg total) by mouth every 8 (eight) hours as needed for muscle spasms. 30 tablet 0   Multiple Vitamins-Minerals (PRESERVISION AREDS 2+MULTI VIT) CAPS Take 1 capsule by mouth 2 (two) times daily.     pantoprazole  (PROTONIX ) 40 MG tablet Take 1 tablet (40 mg total) by mouth daily. 90 tablet 3    polyethylene glycol (MIRALAX  / GLYCOLAX ) 17 g packet Take 17 g by mouth 2 (two) times daily as needed (constipation).  ranolazine  (RANEXA ) 500 MG 12 hr tablet Take 1 tablet (500 mg total) by mouth daily.     valsartan -hydrochlorothiazide  (DIOVAN -HCT) 320-12.5 MG tablet Take 1 tablet by mouth once daily 90 tablet 0   HYDROcodone -acetaminophen  (NORCO/VICODIN) 5-325 MG tablet Take 1 tablet by mouth every 6 (six) hours as needed for severe pain (pain score 7-10). (Patient not taking: Reported on 10/09/2024) 15 tablet 0   No current facility-administered medications for this visit.    REVIEW OF SYSTEMS:   [X]  denotes positive finding, [ ]  denotes negative finding Cardiac  Comments:  Chest pain or chest pressure:    Shortness of breath upon exertion:    Short of breath when lying flat:    Irregular heart rhythm:        Vascular    Pain in calf, thigh, or hip brought on by ambulation:    Pain in feet at night that wakes you up from your sleep:     Blood clot in your veins:    Leg swelling:         Pulmonary    Oxygen  at home:    Productive cough:     Wheezing:         Neurologic    Sudden weakness in arms or legs:     Sudden numbness in arms or legs:     Sudden onset of difficulty speaking or slurred speech:    Temporary loss of vision in one eye:     Problems with dizziness:         Gastrointestinal    Blood in stool:     Vomited blood:         Genitourinary    Burning when urinating:     Blood in urine:        Psychiatric    Major depression:         Hematologic    Bleeding problems:    Problems with blood clotting too easily:        Skin    Rashes or ulcers:        Constitutional    Fever or chills:      PHYSICAL EXAM:   Vitals:   10/15/24 1102  BP: 127/78  Pulse: 74  Resp: 20  Temp: 97.9 F (36.6 C)  TempSrc: Temporal  SpO2: 100%  Weight: 195 lb (88.5 kg)  Height: 5' 9 (1.753 m)    GENERAL: The patient is a well-nourished male, in no acute  distress. The vital signs are documented above. CARDIAC: There is a regular rate and rhythm.  PULMONARY: Non-labored respirations ABDOMEN: Soft and non-tender  MUSCULOSKELETAL: There are no major deformities or cyanosis. NEUROLOGIC: No focal weakness or paresthesias are detected. SKIN: There are no ulcers or rashes noted. PSYCHIATRIC: The patient has a normal affect.  STUDIES:   CTA: 1. No acute intrathoracic pathology. 2. A 7.7 cm infrarenal abdominal aortic aneurysm status post prior endovascular stent graft repair. The excluded aneurysm sac has increased in size since the prior CT. An endoleak is not excluded. Vascular surgery consult is advised. 3. Minimally displaced triangular fracture of the anterior superior L2 with extension of the fracture into the L2 superior endplate. 4. Right paraspinal hematoma extends from L2-L5 along the right psoas muscle and measures approximately 3.5 x 6 cm in greatest axial dimensions. 5. Sigmoid diverticulosis. No bowel obstruction. Normal appendix. 6.  Aortic Atherosclerosis (ICD10-I70.0).    MEDICAL ISSUES:   AAA: The patient is status post endovascular aneurysm repair  and subsequent endoleak repair x 3.  He was recently in the hospital after a fall and the aneurysm measured 7.7 cm.  There is presumably still a endoleak.  He will be referred back to interventional radiology for further discussions of endoleak repair    Malvina New, IV, MD, FACS [facility] [phone] Pager [phone]      [1]  Allergies Allergen Reactions   Roxicodone  [Oxycodone ] Shortness Of Breath and Cough   Statins Other (See Comments)    Myalgias     Zestril [Lisinopril] Cough   Eliquis  [Apixaban ] Other (See Comments)    Blood clots in urine   Welchol [Colesevelam Hcl] Itching   "

## 2024-10-22 LAB — OPHTHALMOLOGY REPORT-SCANNED

## 2024-10-30 DIAGNOSIS — S32029D Unspecified fracture of second lumbar vertebra, subsequent encounter for fracture with routine healing: Secondary | ICD-10-CM | POA: Diagnosis not present

## 2024-10-30 DIAGNOSIS — Z96659 Presence of unspecified artificial knee joint: Secondary | ICD-10-CM | POA: Diagnosis not present

## 2024-10-30 DIAGNOSIS — I5020 Unspecified systolic (congestive) heart failure: Secondary | ICD-10-CM | POA: Diagnosis not present

## 2024-10-30 DIAGNOSIS — Z9181 History of falling: Secondary | ICD-10-CM | POA: Diagnosis not present

## 2024-10-30 DIAGNOSIS — I480 Paroxysmal atrial fibrillation: Secondary | ICD-10-CM | POA: Diagnosis not present

## 2024-10-30 DIAGNOSIS — N1832 Chronic kidney disease, stage 3b: Secondary | ICD-10-CM | POA: Diagnosis not present

## 2024-10-30 DIAGNOSIS — I130 Hypertensive heart and chronic kidney disease with heart failure and stage 1 through stage 4 chronic kidney disease, or unspecified chronic kidney disease: Secondary | ICD-10-CM | POA: Diagnosis not present

## 2024-10-30 DIAGNOSIS — D631 Anemia in chronic kidney disease: Secondary | ICD-10-CM | POA: Diagnosis not present

## 2024-10-30 DIAGNOSIS — I714 Abdominal aortic aneurysm, without rupture, unspecified: Secondary | ICD-10-CM | POA: Diagnosis not present

## 2024-10-30 DIAGNOSIS — N401 Enlarged prostate with lower urinary tract symptoms: Secondary | ICD-10-CM | POA: Diagnosis not present

## 2024-10-30 DIAGNOSIS — K219 Gastro-esophageal reflux disease without esophagitis: Secondary | ICD-10-CM | POA: Diagnosis not present

## 2024-10-30 DIAGNOSIS — E785 Hyperlipidemia, unspecified: Secondary | ICD-10-CM | POA: Diagnosis not present

## 2024-12-11 ENCOUNTER — Ambulatory Visit: Payer: Medicare Other | Admitting: Family Medicine

## 2025-02-04 ENCOUNTER — Ambulatory Visit: Admitting: Surgery

## 2025-02-27 ENCOUNTER — Ambulatory Visit: Admitting: Urology
